# Patient Record
Sex: Male | Born: 1955 | Race: White | Hispanic: No | Marital: Married | Smoking: Current every day smoker
Health system: Southern US, Community
[De-identification: ages and names within clinical notes are randomized; demographics above are authoritative.]

## PROBLEM LIST (undated history)

## (undated) DIAGNOSIS — M1990 Unspecified osteoarthritis, unspecified site: Secondary | ICD-10-CM

## (undated) DIAGNOSIS — Z951 Presence of aortocoronary bypass graft: Secondary | ICD-10-CM

## (undated) DIAGNOSIS — E785 Hyperlipidemia, unspecified: Secondary | ICD-10-CM

## (undated) DIAGNOSIS — F419 Anxiety disorder, unspecified: Secondary | ICD-10-CM

## (undated) DIAGNOSIS — I2510 Atherosclerotic heart disease of native coronary artery without angina pectoris: Secondary | ICD-10-CM

## (undated) DIAGNOSIS — C801 Malignant (primary) neoplasm, unspecified: Secondary | ICD-10-CM

## (undated) DIAGNOSIS — J90 Pleural effusion, not elsewhere classified: Secondary | ICD-10-CM

## (undated) DIAGNOSIS — I509 Heart failure, unspecified: Secondary | ICD-10-CM

## (undated) DIAGNOSIS — I10 Essential (primary) hypertension: Secondary | ICD-10-CM

## (undated) HISTORY — DX: Heart failure, unspecified: I50.9

## (undated) HISTORY — PX: CORONARY ARTERY BYPASS GRAFT: SHX141

## (undated) HISTORY — DX: Pleural effusion, not elsewhere classified: J90

## (undated) HISTORY — DX: Hyperlipidemia, unspecified: E78.5

## (undated) HISTORY — DX: Unspecified osteoarthritis, unspecified site: M19.90

## (undated) HISTORY — DX: Presence of aortocoronary bypass graft: Z95.1

## (undated) HISTORY — DX: Anxiety disorder, unspecified: F41.9

## (undated) HISTORY — DX: Atherosclerotic heart disease of native coronary artery without angina pectoris: I25.10

## (undated) HISTORY — DX: Essential (primary) hypertension: I10

## (undated) HISTORY — PX: OTHER SURGICAL HISTORY: SHX169

---
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
## 2002-01-03 HISTORY — PX: CORONARY ARTERY BYPASS GRAFT: SHX141

## 2002-03-12 ENCOUNTER — Encounter: Payer: Self-pay | Admitting: Thoracic Surgery (Cardiothoracic Vascular Surgery)

## 2002-03-13 ENCOUNTER — Encounter: Payer: Self-pay | Admitting: Thoracic Surgery (Cardiothoracic Vascular Surgery)

## 2002-03-13 ENCOUNTER — Inpatient Hospital Stay (HOSPITAL_COMMUNITY)
Admission: RE | Admit: 2002-03-13 | Discharge: 2002-03-17 | Payer: Self-pay | Admitting: Thoracic Surgery (Cardiothoracic Vascular Surgery)

## 2002-03-14 ENCOUNTER — Encounter: Payer: Self-pay | Admitting: Thoracic Surgery (Cardiothoracic Vascular Surgery)

## 2002-03-15 ENCOUNTER — Encounter: Payer: Self-pay | Admitting: Thoracic Surgery (Cardiothoracic Vascular Surgery)

## 2004-09-27 ENCOUNTER — Ambulatory Visit (HOSPITAL_COMMUNITY): Admission: RE | Admit: 2004-09-27 | Discharge: 2004-09-27 | Payer: Self-pay | Admitting: Orthopedic Surgery

## 2004-09-30 ENCOUNTER — Ambulatory Visit: Payer: Self-pay | Admitting: Cardiology

## 2006-02-24 ENCOUNTER — Ambulatory Visit: Payer: Self-pay | Admitting: Cardiology

## 2006-06-28 ENCOUNTER — Emergency Department (HOSPITAL_COMMUNITY): Admission: EM | Admit: 2006-06-28 | Discharge: 2006-06-28 | Payer: Self-pay | Admitting: Emergency Medicine

## 2007-02-22 ENCOUNTER — Ambulatory Visit: Payer: Self-pay | Admitting: Cardiology

## 2008-02-27 ENCOUNTER — Ambulatory Visit: Payer: Self-pay | Admitting: Cardiology

## 2008-03-07 ENCOUNTER — Encounter (HOSPITAL_COMMUNITY): Admission: RE | Admit: 2008-03-07 | Discharge: 2008-06-05 | Payer: Self-pay | Admitting: Cardiology

## 2008-03-07 ENCOUNTER — Ambulatory Visit: Payer: Self-pay | Admitting: Cardiology

## 2008-04-16 ENCOUNTER — Encounter: Payer: Self-pay | Admitting: Cardiology

## 2008-05-01 ENCOUNTER — Telehealth (INDEPENDENT_AMBULATORY_CARE_PROVIDER_SITE_OTHER): Payer: Self-pay | Admitting: *Deleted

## 2008-05-22 ENCOUNTER — Encounter: Payer: Self-pay | Admitting: Cardiology

## 2008-11-19 ENCOUNTER — Encounter (INDEPENDENT_AMBULATORY_CARE_PROVIDER_SITE_OTHER): Payer: Self-pay | Admitting: *Deleted

## 2010-02-24 ENCOUNTER — Ambulatory Visit: Payer: Self-pay | Admitting: Cardiology

## 2010-03-05 DIAGNOSIS — E785 Hyperlipidemia, unspecified: Secondary | ICD-10-CM | POA: Insufficient documentation

## 2010-03-05 DIAGNOSIS — I2510 Atherosclerotic heart disease of native coronary artery without angina pectoris: Secondary | ICD-10-CM | POA: Insufficient documentation

## 2010-03-05 DIAGNOSIS — I10 Essential (primary) hypertension: Secondary | ICD-10-CM | POA: Insufficient documentation

## 2010-03-05 DIAGNOSIS — E118 Type 2 diabetes mellitus with unspecified complications: Secondary | ICD-10-CM | POA: Insufficient documentation

## 2010-03-09 ENCOUNTER — Encounter: Payer: Self-pay | Admitting: Cardiology

## 2010-03-09 ENCOUNTER — Ambulatory Visit (INDEPENDENT_AMBULATORY_CARE_PROVIDER_SITE_OTHER): Payer: Medicare Other | Admitting: Cardiology

## 2010-03-09 DIAGNOSIS — I2510 Atherosclerotic heart disease of native coronary artery without angina pectoris: Secondary | ICD-10-CM

## 2010-03-16 NOTE — Assessment & Plan Note (Signed)
Summary: F1Y/per pt call=mj rs pt's appt due to dr Loris Winrow in meeting/mt   Visit Type:  Follow-up Primary Provider:  Dr.  Laurian Brim  CC:  CAD.  History of Present Illness: The patient presents for followup of his known coronary disease. It has been about 2 years since the last evaluation. He had a stress perfusion study at that time demonstrating ischemia to be about 45% with no new ischemia. Since then he has had occasional chest discomfort. This happened he walks unremarkable. We will get dyspneic with some activity PND or orthopnea. No new palpitations, presyncope or syncope. He has had no new weeping edema. He has been started on insulin since I last saw him. He thinks his lipids have been well controlled. He has had some form of tingling and numbness felt possibly to be cervical disc disease.  Current Medications (verified): 1)  Aspirin Ec 325 Mg Tbec (Aspirin) .... Take One Tablet By Mouth Daily 2)  Metformin Hcl 500 Mg Tabs (Metformin Hcl) .... Take 2 Tablets Bid 3)  Metoprolol Tartrate 50 Mg Tabs (Metoprolol Tartrate) .... Take One Tablet By Mouth Twice A Day 4)  Crestor 5 Mg Tabs (Rosuvastatin Calcium) .... Take One Tablet By Mouth Daily. 5)  Byetta 10 Mcg Pen 10 Mcg/0.35ml Soln (Exenatide) .... 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      Past Surgical History: CABG Right lower leg surgery Left ankle surgery  Review of Systems       As stated in the HPI and negative for all other systems.   Vital Signs:  Patient profile:   55 year old male Height:      72 inches Weight:      227.75 pounds BMI:     31.00 Pulse rate:   73 / minute Pulse rhythm:    regular Resp:     18 per minute BP sitting:   134 / 77  (left arm) Cuff size:   large  Vitals Entered By: Sidney Ace (March 09, 2010 2:19 PM)  Physical Exam  General:  Well developed, well nourished, in no acute distress. Head:  normocephalic and atraumatic Eyes:  PERRLA/EOM intact; conjunctiva and lids normal. Mouth:  Teeth, gums and palate normal. Oral mucosa normal. Neck:  Neck supple, no JVD. No masses, thyromegaly or abnormal cervical nodes. Chest Wall:  well healed sternotomy scar Lungs:  Clear bilaterally to auscultation and percussion. Abdomen:  Bowel sounds positive; abdomen soft and non-tender without masses, organomegaly, or hernias noted. No hepatosplenomegaly. Msk:  Back normal, normal gait. Muscle strength and tone normal. Extremities:  No clubbing or cyanosis. Neurologic:  Alert and oriented x 3. Skin:  Intact without lesions or rashes. Cervical Nodes:  no significant adenopathy Inguinal Nodes:  no significant adenopathy Psych:  Normal affect.   Detailed Cardiovascular Exam  Neck    Carotids: Carotids full and equal bilaterally without bruits.      Neck Veins: Normal, no JVD.    Heart    Inspection: no deformities or lifts noted.      Palpation: normal PMI with no thrills palpable.      Auscultation: regular rate and rhythm, S1, S2 without murmurs, rubs, gallops, or clicks.  Vascular    Abdominal Aorta: no palpable masses, pulsations, or audible bruits.      Femoral Pulses: normal femoral pulses bilaterally.      Pedal Pulses: normal pedal pulses bilaterally.      Radial Pulses: normal radial pulses bilaterally.      Peripheral Circulation: no clubbing, cyanosis, or edema noted with normal capillary refill.     EKG  Procedure date:  03/09/2010  Findings:      Sinus rhythm, rate 72, early repolarization pattern  Impression & Recommendations:  Problem # 1:  CAD (ICD-414.00) Given his symptoms and ongoing risk factors, with a new bypass grafts  stress perfusion testing is indicated. Because of some joint pains he could walk a treadmill. He pharmacologic perfusion study. Orders: Nuclear Stress Test (Nuc Stress Test) EKG w/ Interpretation (93000)  Problem # 2:  HYPERTENSION (ICD-401.9) his blood pressure is controlled and will continue meds as listed.  Problem # 3:  DYSLIPIDEMIA (ICD-272.4) The goal for his LDL is less than 70 with an HDL of 40. I would be happy to review upcoming lab  Patient Instructions: 1)  Your physician recommends that you schedule a follow-up appointment in: 1 year with Dr. Percival Spanish 2)  Your physician recommends that you continue on your current medications as directed. Please refer to the Current Medication list given to you today. 3)  Your physician has requested that you have an [employer].  For further information please visit [email or web address].  Please follow instruction sheet, as given.

## 2010-03-24 ENCOUNTER — Telehealth: Payer: Self-pay | Admitting: Cardiology

## 2010-03-24 ENCOUNTER — Ambulatory Visit (HOSPITAL_COMMUNITY): Payer: Medicare Other | Attending: Cardiology | Admitting: Radiology

## 2010-03-24 DIAGNOSIS — I25810 Atherosclerosis of coronary artery bypass graft(s) without angina pectoris: Secondary | ICD-10-CM

## 2010-03-24 DIAGNOSIS — R0989 Other specified symptoms and signs involving the circulatory and respiratory systems: Secondary | ICD-10-CM

## 2010-03-24 DIAGNOSIS — R079 Chest pain, unspecified: Secondary | ICD-10-CM

## 2010-03-24 DIAGNOSIS — I4949 Other premature depolarization: Secondary | ICD-10-CM

## 2010-03-24 DIAGNOSIS — I2510 Atherosclerotic heart disease of native coronary artery without angina pectoris: Secondary | ICD-10-CM | POA: Insufficient documentation

## 2010-03-24 DIAGNOSIS — R0609 Other forms of dyspnea: Secondary | ICD-10-CM

## 2010-03-24 MED ORDER — TECHNETIUM TC 99M TETROFOSMIN IV KIT
10.0000 | PACK | Freq: Once | INTRAVENOUS | Status: AC | PRN
  Administered 2010-03-24: 10 via INTRAVENOUS

## 2010-03-24 MED ORDER — TECHNETIUM TC 99M TETROFOSMIN IV KIT
30.0000 | PACK | Freq: Once | INTRAVENOUS | Status: AC | PRN
  Administered 2010-03-24: 30 via INTRAVENOUS

## 2010-03-24 NOTE — Telephone Encounter (Signed)
Walk in pt form received" pt would like correction on Med List" sent to Message Nurse/KM

## 2010-03-24 NOTE — Progress Notes (Addendum)
[place] [address] [phone]  Cardiology Nuclear Med Study Craig Koch male Apr 27, 1955   Nuclear Med Background Indication for Stress Test:  Evaluation for Ischemia and Graft Patency History:  CABG Cardiac Risk Factors: Hypertension, IDDM Type 2, Lipids and Smoker  Symptoms:  Chest Pain (last date of chest discomfort within the last 2 weeks), DOE, Fatigue, Palpitations and SOB   Nuclear Pre-Procedure Caffeine/Decaff Intake:  None NPO After: 8:30pm   Lungs:  clear IV 0.9% NS with Angio Cath:  20g  IV Site: R Antecubital  IV Started by:  Crissie Figures, RN  Chest Size (in):  46 Cup Size:     Height: 6' (1.829 m)  Weight:  228 lb (103.42 kg)  BMI:  Body mass index is 30.92 kg/(m^2). Tech Comments:  Held Metoprolol this AM    Nuclear Med Study 1 or 2 day study: 1 day  Stress Test Type:  Treadmill/Lexiscan  Reading MD: Dola Argyle, MD  Order Authorizing Provider:  J.Hochrein  Resting Radionuclide: Technetium 25m Tetrofosmin  Resting Radionuclide Dose: 11 mCi   Stress Radionuclide:  Technetium 17m Tetrofosmin  Stress Radionuclide Dose: 33 mCi           Stress Protocol Rest HR:63 Stress HR:100  Rest BP: 97/61 Stress BP: 137/61  Exercise Time:  N/A METS: N/A  Predicted HR:N/A % of Maximum:         Dose of Adenosine:N/A   Dose of Lexiscan:  0.4 mg  Dose of Atropine:  N/A Dose of Dobutamine: N/A  mcg/kg/min (at max HR)  Stress Test Technologist: Perrin Maltese, EMT-P  Nuclear Technologist:  Charlton Amor, CNMT     Rest Procedure:  Myocardial perfusion imaging was performed at rest 45 minutes following the intravenous administration of Technetium 91m Tetrofosmin. Rest ECG: NSR. Early Repolarization, and rare PVCS  Stress Procedure:  The patient received IV Lexiscan 0.4 mg over 15-seconds with concurrent low level exercise and then Technetium 48m Tetrofosmin was injected at 30-seconds while the  patient continued walking one more minute.  There were no significant changes with Lexiscan.  Quantitative spect images were obtained after a 45-minute delay. Stress ECG: No significant changes and rare pvcs  QPS Raw Data Images:  Normal; no motion artifact; normal heart/lung ratio. Stress Images:  Moderate decreased activity in the inferior wall and the apex Rest Images:  Moderate decreased activity in the inferior wall. Mild decrease in the apex Subtraction (SDS):  Mild reversibility in the apex  Findings Risk Category:  Abnormal Clinically Abnormal:  No Ischemia:  Yes Fixed Defect:  Yes LV Dysfunction:  Yes Transient Ischemic Dilatation (Normal <1.22): 1.02 Lung/Heart Ratio (Normal <0.45):  .34  Quantitative Gated Spect Images QGS EDV:  135 ml QGS ESV:  65 ml QGS cine images:  Dyssynergy of the septum c/w CABG.  QGS EF:  52 %  Impression Exercise Capacity:  Lexiscan with no exercise. BP Response:  Normal blood pressure response. Clinical Symptoms:  SOB ECG Impression:  No significant ST segment change suggestive of ischemia. Comparison with Prior Nuclear Study: No images to compare  Overall Impression:  There is old mild scar in the inferior wall. There is suggestion of some ischemia at the apex.  The EF is the same or slightly better than previous.  This is a low risk study.  Continue with current therapy.

## 2010-04-02 ENCOUNTER — Telehealth: Payer: Self-pay | Admitting: Cardiology

## 2010-04-02 NOTE — Telephone Encounter (Signed)
Pt wants results of stress test

## 2010-04-05 NOTE — Progress Notes (Signed)
Pt aware of results and to continue current therapy

## 2010-04-05 NOTE — Telephone Encounter (Signed)
Pt aware of results and to continue current therapy

## 2010-05-18 NOTE — Assessment & Plan Note (Signed)
[facility] OFFICE NOTE   NAME:Cantrall, PACO FALKOWITZ                      MRN:          [id number]  DATE:02/22/2007                            DOB:          06-03-1955    PRIMARY:  Dr. Laurian Brim.   REASON PRESENTATION:  Evaluate the patient with coronary disease and  recent syncope.   HISTORY OF PRESENT ILLNESS:  The patient is a 55 year old white  gentleman who presents for yearly follow-up.  He had been doing well  over the last year until about week ago.  He said he stood up from the  couch and had a syncopal episode.  He had no prodrome.  He woke up on  the floor.  There was no one there to see it.  He did not have any loss  of bowel or bladder.  Said he did he apparently hit his left arm as his  forearm and elbow have been sore.  He had been having some discomfort  and weakness in the muscle of his forearm on the ulnar side before this.  He notices the muscle is less prominent than on the right.  He has some  weakness in his ring and pinky fingers on the left-hand.  Had some sharp  pain up to the shoulder.  Has not had any anginal pain.  He denies any  chest pressure, neck or arm discomfort.  He has not had palpitations.  He will feel occasional symptoms of dizziness when he stands up.  Of  note, he stopped his lisinopril on his own after this episode.   PAST MEDICAL HISTORY:  Coronary artery disease (CABG with a LIMA to the  LAD, sequential radial artery to obtuse marginal 2 and obtuse marginal  3, sequential vein graft to diagonal), mildly reduced ejection fraction  (40%), hypertension, diabetes mellitus times 6 to 7 years.   ALLERGIES:  None.   MEDICATIONS:  Aspirin 325 mg daily, glyburide metformin 5/500 b.i.d.,  metoprolol 50 mg b.i.d., Crestor 5 mg daily.   REVIEW OF SYSTEMS:  As stated in the HPI, otherwise in for other  systems.   PHYSICAL EXAMINATION:  The patient is in no distress.  Blood  pressure 112/75, every 84 and regular, weight 237 pounds, body  mass index 32.  HEENT:  Eyelids unremarkable.  Pupils are equal, round, and reactive to  light and accommodation.  Fundi are not visualized.  Oral mucosa  unremarkable.  NECK:  No jugular venous distension 45 degrees, carotid upstroke brisk  and symmetric, no bruits, thyromegaly.  LYMPHATICS:  No adenopathy.  LUNGS:  Clear to auscultation bilaterally.  BACK:  No costovertebral angle tenderness.  CHEST:  Well-healed sternotomy scar.  HEART:  PMI not displaced or sustained, S1 and S2 within normal limits,  no S3, no murmurs.  ABDOMEN:  Obese, positive bowel sounds, normal in frequency and pitch,  no bruits, rebound, guarding.  No hepatomegaly, splenomegaly.  SKIN:  No rashes, no nodules.  EXTREMITIES:  With 2+ pulses throughout, no edema.    EKG sinus rhythm, rate 81, axis within normal  limits, intervals within  normal limits, poor anterior R wave progression.   ASSESSMENT/PLAN:  1. Syncope.  The patient had a syncopal episode.  I did orthostatics      today in the office and he did not drop his blood pressure.      However, this episode was very consistent with orthostatic episode.      I would not suggest arrhythmia.  Has had none since or before that.      He did stop the lisinopril.  I think this is reasonable given this      event.  He will continue on the beta blocker, however.  He will      come back to see me if he has any recurrent presyncope or syncope.  2. Coronary artery disease not any symptoms consistent with angina.      No further cardiovascular testing is suggested.  3. Smoking.  We discussed the need to stop smoking.  Last visit I had      given him Chantix, but he did want to take this.  We discussed this      again (greater than 3 minutes).  Hopefully he can comply with this.  4. Dyslipidemia per Dr. Hardin Negus.  Goals LDL less than 70 and HDL      greater than 40.  5. Diabetes per Dr. Hardin Negus.  6.  Left arm weakness.  The patient as well as weakness in an ulnar      distribution.  At this point I would suggest referral to an      orthopedist.  I will defer to Dr. Hardin Negus.  7. Follow-up will see the patient again in 1 year or sooner if needed.     Minus Breeding, MD, [facility]  Electronically Signed    JH/MedQ  DD: 02/22/2007  DT: 02/23/2007  Job #: [id number]   cc:   Laurian Brim

## 2010-05-18 NOTE — Assessment & Plan Note (Signed)
[facility] OFFICE NOTE   NAME:Craig Koch, Craig Koch                      MRN:          [id number]  DATE:02/27/2008                            DOB:          03-18-1955    PRIMARY CARE PHYSICIAN:  Dr. Laurian Brim.   REASON FOR PRESENTATION:  Evaluate the patient with chest pain and  coronary artery disease.   HISTORY OF PRESENT ILLNESS:  The patient returns for yearly followup.  When I last saw him, he had had syncopal episodes and had his ACE  inhibitors stopped because of this.  He said since then, he has had a  few dizzy spells, but no syncopal episodes.  He has had no presyncope.  He is not really noticing any palpitations.  He probably had orthostasis  at that time and does not seem to be bothered by this.  He does get some  chest discomfort.  This occurs occasionally and sporadically.  It is  midsternal.  It radiates under his left breast.  It can come on with  activity.  It seems to be somewhat similar to his last chest pain and to  his coronary artery disease pain, but he does not really recall the  details of this.  He has taken about a dozen sublingual nitroglycerin in  6 months.  This seems to help discomfort when it comes on.  He is not  describing any associated nausea, vomiting, or diaphoresis.  He does get  short of breath with mild activities.  He says he is limited by ankle  pain.  He has also had some weakness in his hands.  He is apparently  going to see a neurologist for muscle wasting and weakness.  Unfortunately, he still dips some snuff and to try to avoid this, he  chews on a cigar.  He says he is not smoking cigarettes.  He has also  had some bilateral shoulder pain with movement, but this seems to be  related to positioning.   PAST MEDICAL HISTORY:  Coronary artery disease (CABG with LIMA to the  LAD, sequential radial artery to obtuse marginal 2 and obtuse marginal  3, sequential vein  graft to the diagonal), mildly reduced ejection  fraction (40%), hypertension, diabetes mellitus, dyslipidemia.   ALLERGIES:  None.   MEDICATIONS:  1. Aspirin 325 mg daily.  2. Glyburide/metformin 5/500 b.i.d.  3. Metoprolol 50 mg b.i.d.  4. Crestor 5 mg daily.   REVIEW OF SYSTEMS:  As stated in the HPI, otherwise negative for all  other systems.   PHYSICAL EXAMINATION:  GENERAL:  The patient is in no distress.  VITAL SIGNS:  Blood pressure 124/76, heart rate 81 and regular, weight  228 pounds, body mass index 31.  HEENT:  Eyelids unremarkable.  Pupils equal, round, and reactive to  light.  Fundi not visualized.  Oral mucosa unremarkable.  NECK:  No jugular venous distention at 45 degrees.  Carotid upstroke  brisk and symmetric.  No bruits.  No thyromegaly.  LYMPHATICS:  No adenopathy.  LUNGS:  Decreased breath sounds, but no wheezing or  crackles.  BACK:  No costovertebral angle tenderness.  CHEST:  Unremarkable.  HEART:  PMI not displaced or sustained.  S1 and S2 within normal limits.  No S3.  No S4.  No clicks, rubs, or murmurs.  ABDOMEN:  Flat.  Positive bowel sounds, normal in frequency and pitch.  No bruits, no rebound, no guarding.  No midline pulsatile mass.  No  hepatomegaly.  No splenomegaly.  SKIN:  No rashes, no nodules.  EXTREMITIES:  Absent radials bilaterally, but palpable ulnar pulses, 2+  femorals, 2+ popliteals, 1+ dorsalis pedis and posterior tibialis  bilaterally.  NEURO:  Oriented to person, place, and time.  Cranial nerves II-XII  grossly intact.  Motor grossly intact.   EKG, old inferior infarct, poor anterior R-wave progression with  possible old anteroseptal infarct, nonspecific ST-T wave changes, and  all unchanged from previous EKGs.   ASSESSMENT AND PLAN:  1. Chest discomfort.  The patient is describing chest discomfort and      it has been 5 years since his last stress perfusion study.      Interestingly, he had one, 4 weeks after his bypass  when he was      followed by another Cardiology Service.  At this point, given the      possibility that this is recurrent obstruction, he needs to be      screened with a stress perfusion study.  He would not be able to      walk, so he will have an adenosine Cardiolite.  Further evaluation      based on these results.  2. Tobacco.  He understands need to quit smoking altogether.  He was      encouraged to quit even the snuff and cigars that he chews on.  3. Dyspnea.  We will evaluate his ejection fraction with his stress      perfusion study.  If this is unchanged, then no further therapy is      planned.  He does have a reduced ejection fraction, but he was very      orthostatic on ACE inhibitors and so this is being held.  He will      continue the beta-blocker.  4. Dyslipidemia.  Per Dr. Hardin Negus with a goal LDL less than 70 and      HDL greater than 40.  5. Diabetes.  Per Dr. Hardin Negus.  6. Followup.  I would see him back yearly if he does well or sooner if      needed.     Minus Breeding, MD, [facility]  Electronically Signed    JH/MedQ  DD: 02/27/2008  DT: 02/28/2008  Job #: [id number]   cc:   Laurian Brim

## 2010-05-21 NOTE — Op Note (Signed)
NAME:  Craig Koch, RESENDES                         ACCOUNT NO.:  [id number]   MEDICAL RECORD NO.:  [id number]                   PATIENT TYPE:  INP   LOCATION:  2315                                 FACILITY:  [facility]   PHYSICIAN:  Craig Koch, M.D.         DATE OF BIRTH:  02-Dec-1955   DATE OF PROCEDURE:  03/13/2002  DATE OF DISCHARGE:                                 OPERATIVE REPORT   PREOPERATIVE DIAGNOSIS:  Severe three-vessel coronary disease.   POSTOPERATIVE DIAGNOSIS:  Severe three-vessel coronary disease.   PROCEDURES:  1. Median sternotomy.  2. Extracorporeal circulation.  3. Coronary artery bypass grafting x5 (left internal mammary artery to the     left anterior descending coronary artery, sequential left radial artery     to obtuse marginal 2 and 3, saphenous vein graft to the first diagonal,     saphenous vein graft to acute marginal).   SURGEON:  Craig Koch, M.D.   ASSISTANT:  Mardene Celeste, R.N.   ANESTHESIA:  General.   FINDINGS:  Good-quality targets.  Good-quality conduits.  Left ventricular  hypertrophy.   CLINICAL NOTE:  The patient is a 55 year old gentleman who presented earlier  this year with new-onset angina.  He was initially treated medically and had  symptomatic improvement.  He was advised to undergo cardiac catheterization.  Catheterization showed severe three-vessel coronary disease with total  occlusion of his right coronary, a dominant left coronary with a 50%  stenosis in its midportion and a 95% stenosis in its distal portion.  He  also had a 70% stenosis in the proximal portion of the nondominant right  coronary.  The patient was advised to undergo coronary artery bypass  grafting.  The indications, risks, benefits, and alternatives were discussed  in detail with the patient.  He understood the survival benefit with  coronary artery bypass grafting for three-vessel disease.  The patient  understood and accepted the  risks of surgery and agreed to proceed with the  operation.   DESCRIPTION OF PROCEDURE:  The patient was brought to the preop holding are  on 03/13/02.  Lines were placed to monitor arterial, central venous, and  pulmonary arterial pressure.  EKG leads were placed for continuous  telemetry.  Intravenous antibiotics were administered.  The patient was  taken to the operating room, anesthestized, and intubated, the Foley  catheter was placed.  The chest, abdomen, legs, and left arm were prepped  and draped in the usual fashion.   The left radial artery was then harvested using the standard technique.  Incision was made over the distal aspect of the left wrist on the volar  aspect of the wrist.  The radial artery was identified.  It was a good-sized  vessel.  Initially the distal aspect was dissected out and a soft bulldog  clamp was placed across the vessel while palpating distally.  The pulse  remained distally.  This confirmed the  normal preoperative Allen's test that  was performed on two separate occasions.  The decision was made to harvest  the remainder of the vessel.  The incision then was extended to explore the  elbow crease and the radial artery was dissected using the Harmonic scalpel.  Large branches were doubly clipped and divided.  After dissecting the vessel  along its entire length and checking meticulously for bleeding, the distal  end of the vessel was clamped and divided.  The distal stump was flashed  with good back bleeding.  The distal segment was suture ligated with a 4-0  Prolene.  The proximal end then was divided and again the stump was suture  ligated with a 4-0 Prolene suture.  Meticulous hemostasis was achieved.  The  __________ was placed in a heparinized saline-papaverine solution.  The arm  incision was closed in two layers with a 2-0 Vicryl subcutaneous suture and  a 3-0 Vicryl subcuticular suture.   Simultaneous with this, incision was made in the left leg  at the level of  the knee.  An attempt to isolate the greater saphenous vein at that site for  endoscopic harvesting was unsuccessful; therefore, the vein was harvested  from the lower leg.  The vein was of excellent quality in the lower leg.  This was done through bridged incisions with a standard open technique.   A median sternotomy was performed.  The left internal mammary artery was  harvested using standard technique.  This was a good-quality vessel.  The  patient was fully heparinized prior to dividing the distal end of the  mammary artery.   The pericardium was opened and the ascending aorta was inspected and  palpated, and it was of normal size and caliber.  There was no palpable  atherosclerotic disease.  The aorta was cannulated via concentric 2-0  Ethibond nonpledgeted pursestring sutures.  A dual-stage venous cannula was  placed via pursestring suture in the right atrial appendage.  Cardiopulmonary bypass was instituted and the patient was cooled to 32  degrees Celsius.  The coronary arteries were inspected and anastomotic sites  were chosen.  The conduits were inspected and cut to length.  A foam pad was  placed in the pericardium to protect the left phrenic nerve, a temperature  probe was placed in the myocardial septum, and a cardioplegia cannula was  placed in the ascending aorta.   The aorta was crossclamped, the left ventricle was emptied via the aortic  root vent.  Cardiac arrest then was achieved with a combination of cold  antegrade blood cardioplegia and topical iced saline.  Initial there was  slow cooling, but 1 L of cardioplegia was administered and myocardial septal  temperature dropped to 12 degrees Celsius.  The following distal anastomoses  were performed:  First a reversed saphenous vein graft was placed end-to-  side to the acute marginal branch of the right coronary.  This was the largest of the acute marginals.  There were two vessels identified.  The   acute marginal was a 1.5 mm good-quality target.  The vein graft was of good  quality.  The anastomosis was performed with a running 7-0 Prolene suture.  There was excellent flow through the graft.  Cardioplegia was administered.  There was good hemostasis.   Next a reversed saphenous vein graft was placed end-to-side to the first  diagonal branch of the LAD.  This was also a 1.5 mm good-quality target and  again the vein was of good  quality.  The anastomosis again was performed  with a running 7-0 Prolene suture.  There was excellent flow through this  graft as well.  With cardioplegia administration there was good hemostasis.   Next the left radial artery was placed sequentially to obtuse marginals 2  and 3.  The left circumflex was a large dominant vessel.  Obtuse marginal 2  was the largest lateral branch.  There was a 50% stenosis in the left  circumflex just after the takeoff of OM-1.  OM-2 itself was a 2 mm good-  quality target.  A side-to-side anastomosis was performed of the radial  artery to the OM-2 with a running 8-0 Prolene suture.  The anastomosis was  probed proximally and distally and the probe was also passed retrograde  through the radial artery across the anastomosis to ensure patency in all  directions.  Next an end-to-side anastomosis was performed to OM-3, which  was compromised by a 95% stenosis.  This was a 1.5 mm good-quality target.  Both of these anastomoses were performed with running 8-0 Prolene sutures.  There was good flow through this graft.  Cardioplegia was administered via  the aortic root and the vein grafts.  There was good back bleeding from the  radial graft.  It was evident at this point that the radial graft was not of  adequate length to each the aorta for a proximal anastomosis.   Next the left internal mammary artery was brought through a window in the  pericardium.  The distal end was spatulated.  It was a 2.5 mm good-quality  vessel.  It was  anastomosed end-to-side to the LAD, which was a 1.5 mm fair-  quality target.  The LAD was totally occluded proximally.  The 1.5 mm did  pass both retrograde to the total occlusion as well as antegrade to the  apex.  The anastomosis was performed with a running 8-0 Prolene suture in an  end-to-side fashion.  At completion of the mammary to LAD anastomosis, the  bulldog clamp was briefly removed from the mammary artery to inspect for  hemostasis and then was replaced.  There was immediate and rapid septal  rewarming.  The mammary pedicle was tacked to the epicardial surface of the  heart with 6-0 Prolene sutures.  Additional cardioplegia was administered.  The vein grafts were cut to length.   The proximal vein graft anastomoses were then performed to 4.0 mm punch  aortotomies with running 6-0 Prolene sutures.  At the completion of the  final proximal anastomosis the patient was placed in Trendelenburg position. The aortic root was de-aired, the bulldog clamp was removed from the mammary  artery, and immediate and rapid septal rewarming was once again noted.  Lidocaine was administered.  The aortic crossclamp was then removed with a  total crossclamp time of 81 minutes.  The veins were aspirated to remove any  residual trapped air.  The left radial artery was cut to length proximally.  Bulldog clamps were placed on the diagonal vein graft proximally and  distally.  A longitudinal venotomy was made in the diagonal vein graft and  the proximal anastomosis of the radial artery was placed to the diagonal  vein with a running 7-0 Prolene suture.  The anastomosis was de-aired.  The  bulldog clamps were removed.  All proximal and distal anastomoses then were  inspected for hemostasis.  This patient was then rewarmed.  Epicardial  pacing wires were placed on the right ventricle and right  atrium.  When the  patient reached a core temperature of 37 degrees Celsius, he was weaned from  cardiopulmonary  bypass.  He was on a low-dose dopamine drip and in sinus  rhythm at the time of separation from bypass.  Total bypass time was 147  minutes.  Initial cardiac index was greater than 2 L/min. per sq. m, and the  patient remained hemodynamically stable throughout the post bypass period.   A test dose of protamine was administered and was well-tolerated.  The  atrial and aortic cannulae were removed and the remainder of the protamine  was administered without incident.  The chest was irrigated with 1 L of warm  normal saline containing 1 g of vancomycin.  Hemostasis was achieved.  A  left pleural and two mediastinal chest tubes were placed through separate  subcostal incisions.  The pericardium was reapproximated with interrupted 3-  0 silk sutures.  It came together easily without tension.  There was no  hemodynamic change.  The sternum was closed with heavy-gauge stainless steel  wires.  It came together easily, and there again was no hemodynamic change  with closure of the  chest.  The remainder of the incisions were closed in standard fashion with  subcuticular skin closures.  All sponge, needle, and instrument counts were  correct at the end of the procedure.  The patient remained hemodynamically  stable and was taken from the operating room to the surgical intensive care  unit in critical but stable condition.                                                Craig Standard Roxan Koch, M.D.    SCH/MEDQ  D:  03/13/2002  T:  03/14/2002  Job:  [id number]   cc:   Eden Lathe. Einar Gip, M.D.  [address], [address] [place] [address]  Fax: [place]  [address]  [place]  [place] [address]  Fax: [phone]

## 2010-05-21 NOTE — Discharge Summary (Signed)
NAME:  Craig Koch, Craig Koch                         ACCOUNT NO.:  [id number]   MEDICAL RECORD NO.:  [id number]                   PATIENT TYPE:  INP   LOCATION:  2013                                 FACILITY:  [facility]   PHYSICIAN:  Revonda Standard. Roxan Hockey, M.D.         DATE OF BIRTH:  09-08-1955   DATE OF ADMISSION:  03/13/2002  DATE OF DISCHARGE:  03/17/2002                                 DISCHARGE SUMMARY   DISCHARGE DIAGNOSES:  1. Severe three-vessel atherosclerotic coronary artery disease.  2. Type 2 diabetes mellitus.  3. Hypercholesterolemia.  4. History of recent myocardial infarction.  5. History of current long-term tobacco habituation.   PROCEDURE:  On March 13, 2002, coronary artery bypass graft surgery x5.  In  this procedure, the left internal mammary artery was connected in an end-to-  side fashion to the left anterior descending coronary artery.  A reverse  saphenous vein graft was fashioned from the aorta to the first diagonal.  A  reverse saphenous vein graft was fashioned from the aorta to the acute  marginal.  A sequential left radial artery was fashioned from the aorta to  the second obtuse marginal and then to the third obtuse marginal.  Dr.  Modesto Charon was the surgeon.   DISPOSITION:  The patient was ready for discharge on postop day #4 after  undergoing coronary artery bypass graft surgery.  His postoperative  convalescence has been unremarkable.  He has maintained sinus rhythm  throughout the postoperative period.  He has not required any oxygen  supplementation.  He was extubated on the day of surgery to 2 L of nasal  cannula at 98%.  He was on room air by postop day #2.  He did have volume  overload.  He was nine pounds of fluid over his preoperative weight on  postop day #3, and will go home with gentle diuresis with Lasix.  He has  been afebrile and his mental status is clear.  His incisions are all healing  nicely.  There is no evidence of erythema,  swelling or drainage.  He does  have some peripheral edema from his volume overload.  While in the hospital,  he has been counseled on smoking cessation and started on Zyban.  He has  also had excellent serum blood glucose control and started on Avandia as  well as his home medication which is Glucovance.   DISCHARGE MEDICATIONS:  1. Oxycodone 5 mg one to two tablets p.o. q.4-6h. p.r.n. pain, although he     is not taking much pain medicine at the present.  2. Enteric coated aspirin 325 mg daily.  3. Coreg 12.5 mg tablet in the morning and 1/2 of a tablet 6.25 mg in the     evening.  4. Lipitor 10 mg daily, preferably at bedtime.  5. Isosorbide mononitrate 30 mg daily.  6. Zyban 150 mg daily.  7. Avandia 4 mg daily.  8. Glucovance  5/500 mg twice daily.  9. Lasix 40 mg daily x5 days.  10.      Potassium chloride 20 mEq daily x5 days.   ACTIVITY:  Walk daily to keep up his strength.  He is not to drive until he  sees Dr. Roxan Hockey.   DIET:  Low sodium, low cholesterol, ADA diet.   SPECIAL INSTRUCTIONS:  He may shower.  He is to call Dr. Leonarda Salon  office if his incisions become inflamed and start to drain.   FOLLOW UP:  He has an office visit with Dr. Einar Gip two weeks after discharge.  He is to call [phone], to arrange the appointment.  Chest x-ray will be  taken.  He will see Dr. Roxan Hockey three weeks after discharge.  Dr.  Leonarda Salon office will call.   HISTORY OF PRESENT ILLNESS:  The patient is a 55 year old male who has a  four-year history of type 2 diabetes mellitus.  He had no known cardiac  disease until recently.  Several months ago, he began having chest pain.  He  describes the pain as a tightness in the chest, primarily related to  exertion, although, he has had episodes with minimal activity or at rest.  He was seen by Dr. Laurian Brim and Dr. Hardin Negus was concerned that with  risk factors present, this could be angina.  He was started on  medications.  His electrocardiogram showed changes and he was referred to Dr. Adrian Prows.  Dr. Einar Gip did a stress test which showed marked abnormality and the patient  was convinced to undergo left heart catheterization.  Catheterization showed  severe three-vessel atherosclerotic coronary artery disease with an ejection  fraction of approximately 45%.  The right coronary artery had a 70-80%  stenosis.  There was a 95% stenosis in the circumflex prior to the left  posterior descending.  He had total occlusion of his left anterior  descending coronary artery after the third diagonal.  He has global  hypokinesis, particularly anterior and anterolateral hypokinesis.  The  patient is sent to Dr. Roxan Hockey for consideration of coronary artery  bypass graft surgery.   HOSPITAL COURSE:  Hospital course is relatively unremarkable.  The patient  goes home on postop day #4, as described in discharge disposition.       Sueanne Margarita, P.A.                    Revonda Standard Roxan Hockey, M.D.    GM/MEDQ  D:  03/16/2002  T:  03/18/2002  Job:  [id number]   cc:   Eden Lathe. Einar Gip, M.D.  [address], [address] [place] [address]  Fax: [place]  [address]  [place]  [place] [address]  Fax: [phone]

## 2010-05-21 NOTE — Assessment & Plan Note (Signed)
[facility] OFFICE NOTE   NAME:Craig Koch, DEKENDRICK SLIGH                      MRN:          [id number]  DATE:02/24/2006                            DOB:          04-May-1955    PRIMARY CARE PHYSICIAN:  Dr. Laurian Brim   REASON FOR PRESENTATION:  Evaluate patient with coronary artery disease.   HISTORY OF PRESENT ILLNESS:  The patient presents for followup of his  known coronary disease.  Since I last saw him, he has done well from a  cardiovascular standpoint.  He has, for a couple of years, been getting  some atypical chest discomfort.  This is rare and fleeting.  It seems to  go away with burping.  If it does not go away, he has taken a  nitroglycerin on rare occasions.  He thinks he has probably had ten in  the last year and a half.  He has not had any in the last couple of  months.  He is able to do vigorous activity, such as climb many rungs up  a couple of floors on a wall ladder.  This is quite strenuous, per his  report.  He does not get any chest discomfort with this.  He denies any  neck discomfort, arm discomfort, activity induced nausea or vomiting, or  excessive diaphoresis.  He has had no palpitations, presyncope or  syncope.  He denies any PND or orthopnea.   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   ALLERGIES:  None.   MEDICATIONS:  1. Aspirin 325 mg daily (has not been taking this because of stomach      upset).  2. Glyburide/metformin 5/500 b.i.d.  3. Metoprolol 50 mg b.i.d.  4. Lisinopril 10 mg daily.  5. Crestor 5 mg daily.  6. Triglide 160 mg daily.   REVIEW OF SYSTEMS:  As stated in the HPI, and otherwise negative for  other systems.   PHYSICAL EXAMINATION:   The patient is in no distress.  Blood pressure  145/85, heart rate 82 and regular.  HEENT:  Eyelids unremarkable.  Pupils equal, round and reactive to  light.  Fundi not visualized.  Oral mucosa unremarkable.  NECK:  No jugular venous distention, wave form within normal limits.  Carotid upstroke brisk and symmetric.  No bruits, no thyromegaly.  LYMPHATICS:  No cervical, axillary or inguinal adenopathy.  LUNGS:  Clear to auscultation bilaterally.  BACK:  No costovertebral angle tenderness.  CHEST:  Well-healed sternotomy scar.  HEART:  PMI not displaced or sustained.  S1 and S2 within normal limits.  No S3, no S4, no clicks, no rubs, no murmurs.  ABDOMEN:  Obese, positive bowel sounds, normal in frequency and pitch.  No bruits, rebound, guarding, no midline pulsatile mass, hepatomegaly,  splenomegaly.  SKIN:  No rashes, no nodules.  EXTREMITIES:  2+ pulses, no edema, no cyanosis or clubbing.  NEUROLOGIC:  Oriented to person, place and time.  Cranial nerves II  through XII grossly intact.  Motor grossly intact.   EKG:  Sinus rhythm, rate 74, axis within normal limits, intervals within  normal limits, poor anterior R-wave progression, no acute STT-wave  changes.   ASSESSMENT AND PLAN:  1. Coronary artery disease:  The patient is having no symptoms      consistent with his previous angina.  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  He      says he may have to go up and down eight times in a two-hour      period.  This gets his heart rate up and gets him sweating.  With      this, he denies any chest discomfort.  Therefore, I do not think      any further cardiovascular testing is suggested.  He needs      continued primary risk reduction.  2. Hypertension:  His blood pressure is slightly elevated.  He will      continue the other medications as listed.  I have suggested weight-      loss to bring him down to a target of 120/70.  If  not, he should      have up-titration of his lisinopril per Dr. Hardin Negus.  3. Dyslipidemia:  He has his lipid profile and liver enzymes followed      closely by Dr. Hardin Negus.  The goal would be an LDL less than 70 and      an HDL greater than 40.  4. Erectile dysfunction.  The patient reports this.  I have taken the      liberty of giving him Viagra 50 mg p.r.n.  He knows that he cannot      use this with nitrates.  5. Tobacco:  He understands the need to quit smoking.  I have given      him a starter prescription for Chantix.  He expresses no depression      or suicidal ideation.  6. Followup:  I will see the patient back in 12 months.  For      convenience, I can see him in the [place] office.     Minus Breeding, MD, [facility]  Electronically Signed    JH/MedQ  DD: 02/24/2006  DT: 02/24/2006  Job #: [id number]   cc:   Laurian Brim

## 2010-10-20 LAB — COMPREHENSIVE METABOLIC PANEL
ALT: 16
AST: 32
Albumin: 4.7
Alkaline Phosphatase: 96
BUN: 35 — ABNORMAL HIGH
CO2: 26
Calcium: 10.4
Chloride: 89 — ABNORMAL LOW
Creatinine, Ser: 2.79 — ABNORMAL HIGH
GFR calc Af Amer: 29 — ABNORMAL LOW
GFR calc non Af Amer: 24 — ABNORMAL LOW
Glucose, Bld: 155 — ABNORMAL HIGH
Potassium: 4.8
Sodium: 131 — ABNORMAL LOW
Total Bilirubin: 1.5 — ABNORMAL HIGH
Total Protein: 8.3

## 2010-10-20 LAB — CBC
HCT: 45.2
Hemoglobin: 15.8
MCHC: 35
MCV: 99.7
Platelets: 243
RBC: 4.53
RDW: 12.9
WBC: 13.4 — ABNORMAL HIGH

## 2010-10-20 LAB — POCT CARDIAC MARKERS
CKMB, poc: 1.3
CKMB, poc: 1.8
CKMB, poc: 2.2
Myoglobin, poc: >500
Myoglobin, poc: >500
Myoglobin, poc: >500
Operator id: 257131
Operator id: 282201
Operator id: 282201
Troponin i, poc: <0.05
Troponin i, poc: <0.05
Troponin i, poc: <0.05

## 2010-10-20 LAB — DIFFERENTIAL
Basophils Absolute: 0
Basophils Relative: 0
Eosinophils Absolute: 0.1
Eosinophils Relative: 1
Lymphocytes Relative: 9 — ABNORMAL LOW
Lymphs Abs: 1.1
Monocytes Absolute: 1.2 — ABNORMAL HIGH
Monocytes Relative: 9
Neutro Abs: 10.9 — ABNORMAL HIGH
Neutrophils Relative %: 82 — ABNORMAL HIGH

## 2010-10-20 LAB — APTT: aPTT: 25

## 2010-10-20 LAB — CK: Total CK: 252 — ABNORMAL HIGH

## 2010-12-02 NOTE — Telephone Encounter (Signed)
Please close encounter

## 2011-03-10 ENCOUNTER — Ambulatory Visit: Payer: Medicare Other | Admitting: Cardiology

## 2011-03-29 ENCOUNTER — Ambulatory Visit: Payer: Medicare Other | Admitting: Cardiology

## 2011-05-16 ENCOUNTER — Encounter: Payer: Self-pay | Admitting: Cardiology

## 2011-05-23 ENCOUNTER — Ambulatory Visit (INDEPENDENT_AMBULATORY_CARE_PROVIDER_SITE_OTHER): Payer: Medicare Other | Admitting: Cardiology

## 2011-05-23 ENCOUNTER — Encounter: Payer: Self-pay | Admitting: Cardiology

## 2011-05-23 ENCOUNTER — Encounter: Payer: Self-pay | Admitting: Neurology

## 2011-05-23 VITALS — BP 107/68 | HR 63 | Ht 72.0 in | Wt 232.8 lb

## 2011-05-23 DIAGNOSIS — I2510 Atherosclerotic heart disease of native coronary artery without angina pectoris: Secondary | ICD-10-CM

## 2011-05-23 DIAGNOSIS — R29898 Other symptoms and signs involving the musculoskeletal system: Secondary | ICD-10-CM

## 2011-05-23 DIAGNOSIS — E785 Hyperlipidemia, unspecified: Secondary | ICD-10-CM

## 2011-05-23 DIAGNOSIS — M6281 Muscle weakness (generalized): Secondary | ICD-10-CM

## 2011-05-23 DIAGNOSIS — M6250 Muscle wasting and atrophy, not elsewhere classified, unspecified site: Secondary | ICD-10-CM

## 2011-05-23 DIAGNOSIS — E119 Type 2 diabetes mellitus without complications: Secondary | ICD-10-CM

## 2011-05-23 DIAGNOSIS — I10 Essential (primary) hypertension: Secondary | ICD-10-CM

## 2011-05-23 NOTE — Assessment & Plan Note (Signed)
He has significant hand weakness and muscle wasting. I will refer him to our neurologists.

## 2011-05-23 NOTE — Assessment & Plan Note (Signed)
I reviewed her lipids which included a triglyceride of 313, calculated LDL of 120 and HDL of 35. He does not want to take more medications but does say he will try harder with diet. We will give him literature concerning this.

## 2011-05-23 NOTE — Assessment & Plan Note (Signed)
Per Ronald Lobo, MD

## 2011-05-23 NOTE — Progress Notes (Signed)
   HPI The patient returns for yearly followup. Since I last saw him he has had no new cardiovascular complaints. He did have some chest discomfort and dyspnea last year. However, stress perfusion study demonstrated an EF of 52% with some old inferior scar and only perhaps mild apical ischemia. He was low risk and we followed medically. Since that time he has had no change in these complaints. He's been particularly limited by weakness in his hands. He said some time ago he saw a neurologist in [place] and was told he had a cervical problem but there was no treatment. He now has muscle wasting and has trouble doing things such as holding utensils and opening a jar. He denies any chest pressure, neck or arm discomfort. He's not having any new palpitations, presyncope or syncope. There has been no PND or orthopnea.  He still smokes occasional cigarettes and uses smokeless tobacco routinely.  No Known Allergies  Current Outpatient Prescriptions  Medication Sig Dispense Refill  . aspirin 325 MG tablet Take 325 mg by mouth daily.      . insulin detemir (LEVEMIR) 100 UNIT/ML injection Inject 40 Units into the skin at bedtime.      . metFORMIN (GLUCOPHAGE) 500 MG tablet Take 500 mg by mouth 2 (two) times daily with a meal. Take 2 tablets bid      . metoprolol succinate (TOPROL-XL) 50 MG 24 hr tablet Take 50 mg by mouth 2 (two) times daily. Take with or immediately following a meal.      . rosuvastatin (CRESTOR) 5 MG tablet Take 5 mg by mouth daily.        Past Medical History  Diagnosis Date  . Coronary artery disease   . Hypertension   . Diabetes mellitus   . Cardiac LV ejection fraction 21-40%   . Dyslipidemia     Past Surgical History  Procedure Date  . Coronary artery bypass graft     (CABG with LIMA to the    . Right lower leg surgery   . Left ankle surgery     ROS:  As stated in the HPI and negative for all other systems.  PHYSICAL EXAM BP 107/68  Pulse 63  Ht 6' (1.829 m)  Wt  232 lb 12.8 oz (105.597 kg)  BMI 31.57 kg/m2 GENERAL:  Well appearing HEENT:  Pupils equal round and reactive, fundi not visualized, oral mucosa unremarkable, dentures NECK:  No jugular venous distention, waveform within normal limits, carotid upstroke brisk and symmetric, no bruits, no thyromegaly LYMPHATICS:  No cervical, inguinal adenopathy LUNGS:  Clear to auscultation bilaterally BACK:  No CVA tenderness CHEST:  Well healed sternotomy scar. HEART:  PMI not displaced or sustained,S1 and S2 within normal limits, no S3, no S4, no clicks, no rubs, no murmurs ABD:  Flat, positive bowel sounds normal in frequency in pitch, no bruits, no rebound, no guarding, no midline pulsatile mass, no hepatomegaly, no splenomegaly EXT:  2 plus pulses throughout, no edema, no cyanosis no clubbing SKIN:  No rashes, lipomas on the back NEURO:  Cranial nerves II through XII grossly intact, motor grossly intact throughout, thenar eminence muscle wasting PSYCH:  Cognitively intact, oriented to person place and time  EKG:  Sinus rhythm, rate 63, axis within normal limits, intervals within normal limits, no acute ST-T wave changes.  05/23/2011   ASSESSMENT AND PLAN

## 2011-05-23 NOTE — Assessment & Plan Note (Signed)
The patient has no new sypmtoms.  No further cardiovascular testing is indicated.  We will continue with aggressive risk reduction and meds as listed.  

## 2011-05-23 NOTE — Assessment & Plan Note (Signed)
The blood pressure is at target. No change in medications is indicated. We will continue with therapeutic lifestyle changes (TLC).  

## 2011-05-23 NOTE — Patient Instructions (Addendum)
The current medical regimen is effective;  continue present plan and medications.  You have been referred to neurology for the evaluation of muscle wasting in hands.  Follow up in 1 year with Dr Percival Spanish.  You will receive a letter in the mail 2 months before you are due.  Please call us when you receive this letter to schedule your follow up appointment. Hypertriglyceridemia  Diet for High blood levels of Triglycerides Most fats in food are triglycerides. Triglycerides in your blood are stored as fat in your body. High levels of triglycerides in your blood may put you at a greater risk for heart disease and stroke.  Normal triglyceride levels are less than 150 mg/dL. Borderline high levels are 150-199 mg/dl. High levels are 200 - 499 mg/dL, and very high triglyceride levels are greater than 500 mg/dL. The decision to treat high triglycerides is generally based on the level. For people with borderline or high triglyceride levels, treatment includes weight loss and exercise. Drugs are recommended for people with very high triglyceride levels. Many people who need treatment for high triglyceride levels have metabolic syndrome. This syndrome is a collection of disorders that often include: insulin resistance, high blood pressure, blood clotting problems, high cholesterol and triglycerides. TESTING PROCEDURE FOR TRIGLYCERIDES  You should not eat 4 hours before getting your triglycerides measured. The normal range of triglycerides is between 10 and 250 milligrams per deciliter (mg/dl). Some people may have extreme levels (1000 or above), but your triglyceride level may be too high if it is above 150 mg/dl, depending on what other risk factors you have for heart disease.   People with high blood triglycerides may also have high blood cholesterol levels. If you have high blood cholesterol as well as high blood triglycerides, your risk for heart disease is probably greater than if you only had high  triglycerides. High blood cholesterol is one of the main risk factors for heart disease.  CHANGING YOUR DIET  Your weight can affect your blood triglyceride level. If you are more than 20% above your ideal body weight, you may be able to lower your blood triglycerides by losing weight. Eating less and exercising regularly is the best way to combat this. Fat provides more calories than any other food. The best way to lose weight is to eat less fat. Only 30% of your total calories should come from fat. Less than 7% of your diet should come from saturated fat. A diet low in fat and saturated fat is the same as a diet to decrease blood cholesterol. By eating a diet lower in fat, you may lose weight, lower your blood cholesterol, and lower your blood triglyceride level.  Eating a diet low in fat, especially saturated fat, may also help you lower your blood triglyceride level. Ask your dietitian to help you figure how much fat you can eat based on the number of calories your caregiver has prescribed for you.  Exercise, in addition to helping with weight loss may also help lower triglyceride levels.   Alcohol can increase blood triglycerides. You may need to stop drinking alcoholic beverages.   Too much carbohydrate in your diet may also increase your blood triglycerides. Some complex carbohydrates are necessary in your diet. These may include bread, rice, potatoes, other starchy vegetables and cereals.   Reduce "simple" carbohydrates. These may include pure sugars, candy, honey, and jelly without losing other nutrients. If you have the kind of high blood triglycerides that is affected by the amount of  carbohydrates in your diet, you will need to eat less sugar and less high-sugar foods. Your caregiver can help you with this.   Adding 2-4 grams of fish oil (EPA+ DHA) may also help lower triglycerides. Speak with your caregiver before adding any supplements to your regimen.  Following the Diet  Maintain your  ideal weight. Your caregivers can help you with a diet. Generally, eating less food and getting more exercise will help you lose weight. Joining a weight control group may also help. Ask your caregivers for a good weight control group in your area.  Eat low-fat foods instead of high-fat foods. This can help you lose weight too.  These foods are lower in fat. Eat MORE of these:   Dried beans, peas, and lentils.   Egg whites.   Low-fat cottage cheese.   Fish.   Lean cuts of meat, such as round, sirloin, rump, and flank (cut extra fat off meat you fix).   Whole grain breads, cereals and pasta.   Skim and nonfat dry milk.   Low-fat yogurt.   Poultry without the skin.   Cheese made with skim or part-skim milk, such as mozzarella, parmesan, farmers', ricotta, or pot cheese.  These are higher fat foods. Eat LESS of these:   Whole milk and foods made from whole milk, such as American, blue, cheddar, monterey jack, and swiss cheese   High-fat meats, such as luncheon meats, sausages, knockwurst, bratwurst, hot dogs, ribs, corned beef, ground pork, and regular ground beef.   Fried foods.  Limit saturated fats in your diet. Substituting unsaturated fat for saturated fat may decrease your blood triglyceride level. You will need to read package labels to know which products contain saturated fats.  These foods are high in saturated fat. Eat LESS of these:   Fried pork skins.   Whole milk.   Skin and fat from poultry.   Palm oil.   Butter.   Shortening.   Cream cheese.   Berniece Salines.   Margarines and baked goods made from listed oils.   Vegetable shortenings.   Chitterlings.   Fat from meats.   Coconut oil.   Palm kernel oil.   Lard.   Cream.   Sour cream.   Fatback.   Coffee whiteners and non-dairy creamers made with these oils.   Cheese made from whole milk.  Use unsaturated fats (both polyunsaturated and monounsaturated) moderately. Remember, even though  unsaturated fats are better than saturated fats; you still want a diet low in total fat.  These foods are high in unsaturated fat:   Canola oil.   Sunflower oil.   Mayonnaise.   Almonds.   Peanuts.   Pine nuts.   Margarines made with these oils.   Safflower oil.   Olive oil.   Avocados.   Cashews.   Peanut butter.   Sunflower seeds.   Soybean oil.   Peanut oil.   Olives.   Pecans.   Walnuts.   Pumpkin seeds.  Avoid sugar and other high-sugar foods. This will decrease carbohydrates without decreasing other nutrients. Sugar in your food goes rapidly to your blood. When there is excess sugar in your blood, your liver may use it to make more triglycerides. Sugar also contains calories without other important nutrients.  Eat LESS of these:   Sugar, brown sugar, powdered sugar, jam, jelly, preserves, honey, syrup, molasses, pies, candy, cakes, cookies, frosting, pastries, colas, soft drinks, punches, fruit drinks, and regular gelatin.   Avoid alcohol. Alcohol, even more  than sugar, may increase blood triglycerides. In addition, alcohol is high in calories and low in nutrients. Ask for sparkling water, or a diet soft drink instead of an alcoholic beverage.  Suggestions for planning and preparing meals   Bake, broil, grill or roast meats instead of frying.   Remove fat from meats and skin from poultry before cooking.   Add spices, herbs, lemon juice or vinegar to vegetables instead of salt, rich sauces or gravies.   Use a non-stick skillet without fat or use no-stick sprays.   Cool and refrigerate stews and broth. Then remove the hardened fat floating on the surface before serving.   Refrigerate meat drippings and skim off fat to make low-fat gravies.   Serve more fish.   Use less butter, margarine and other high-fat spreads on bread or vegetables.   Use skim or reconstituted non-fat dry milk for cooking.   Cook with low-fat cheeses.   Substitute low-fat  yogurt or cottage cheese for all or part of the sour cream in recipes for sauces, dips or congealed salads.   Use half yogurt/half mayonnaise in salad recipes.   Substitute evaporated skim milk for cream. Evaporated skim milk or reconstituted non-fat dry milk can be whipped and substituted for whipped cream in certain recipes.   Choose fresh fruits for dessert instead of high-fat foods such as pies or cakes. Fruits are naturally low in fat.  When Dining Out   Order low-fat appetizers such as fruit or vegetable juice, pasta with vegetables or tomato sauce.   Select clear, rather than cream soups.   Ask that dressings and gravies be served on the side. Then use less of them.   Order foods that are baked, broiled, poached, steamed, stir-fried, or roasted.   Ask for margarine instead of butter, and use only a small amount.   Drink sparkling water, unsweetened tea or coffee, or diet soft drinks instead of alcohol or other sweet beverages.  QUESTIONS AND ANSWERS ABOUT OTHER FATS IN THE BLOOD: SATURATED FAT, TRANS FAT, AND CHOLESTEROL What is trans fat? Trans fat is a type of fat that is formed when vegetable oil is hardened through a process called hydrogenation. This process helps makes foods more solid, gives them shape, and prolongs their shelf life. Trans fats are also called hydrogenated or partially hydrogenated oils.  What do saturated fat, trans fat, and cholesterol in foods have to do with heart disease? Saturated fat, trans fat, and cholesterol in the diet all raise the level of LDL "bad" cholesterol in the blood. The higher the LDL cholesterol, the greater the risk for coronary heart disease (CHD). Saturated fat and trans fat raise LDL similarly.  What foods contain saturated fat, trans fat, and cholesterol? High amounts of saturated fat are found in animal products, such as fatty cuts of meat, chicken skin, and full-fat dairy products like butter, whole milk, cream, and cheese, and in  tropical vegetable oils such as palm, palm kernel, and coconut oil. Trans fat is found in some of the same foods as saturated fat, such as vegetable shortening, some margarines (especially hard or stick margarine), crackers, cookies, baked goods, fried foods, salad dressings, and other processed foods made with partially hydrogenated vegetable oils. Small amounts of trans fat also occur naturally in some animal products, such as milk products, beef, and lamb. Foods high in cholesterol include liver, other organ meats, egg yolks, shrimp, and full-fat dairy products. How can I use the new food label to make heart-healthy  food choices? Check the Nutrition Facts panel of the food label. Choose foods lower in saturated fat, trans fat, and cholesterol. For saturated fat and cholesterol, you can also use the Percent Daily Value (%DV): 5% DV or less is low, and 20% DV or more is high. (There is no %DV for trans fat.) Use the Nutrition Facts panel to choose foods low in saturated fat and cholesterol, and if the trans fat is not listed, read the ingredients and limit products that list shortening or hydrogenated or partially hydrogenated vegetable oil, which tend to be high in trans fat. POINTS TO REMEMBER: YOU NEED A LITTLE TLC (THERAPEUTIC LIFESTYLE CHANGES)  Discuss your risk for heart disease with your caregivers, and take steps to reduce risk factors.   Change your diet. Choose foods that are low in saturated fat, trans fat, and cholesterol.   Add exercise to your daily routine if it is not already being done. Participate in physical activity of moderate intensity, like brisk walking, for at least 30 minutes on most, and preferably all days of the week. No time? Break the 30 minutes into three, 10-minute segments during the day.   Stop smoking. If you do smoke, contact your caregiver to discuss ways in which they can help you quit.   Do not use street drugs.   Maintain a normal weight.   Maintain a  healthy blood pressure.   Keep up with your blood work for checking the fats in your blood as directed by your caregiver.  Document Released: 10/08/2003 Document Revised: 12/09/2010 Document Reviewed: 05/05/2008 [facility] Patient Information 2012 [place].

## 2011-07-25 ENCOUNTER — Ambulatory Visit: Payer: Medicare Other | Admitting: Neurology

## 2012-05-24 ENCOUNTER — Ambulatory Visit (INDEPENDENT_AMBULATORY_CARE_PROVIDER_SITE_OTHER): Payer: Medicare Other | Admitting: Cardiology

## 2012-05-24 ENCOUNTER — Encounter: Payer: Self-pay | Admitting: Cardiology

## 2012-05-24 VITALS — BP 114/69 | HR 62 | Ht 72.0 in | Wt 237.8 lb

## 2012-05-24 DIAGNOSIS — E785 Hyperlipidemia, unspecified: Secondary | ICD-10-CM

## 2012-05-24 DIAGNOSIS — I10 Essential (primary) hypertension: Secondary | ICD-10-CM

## 2012-05-24 DIAGNOSIS — M542 Cervicalgia: Secondary | ICD-10-CM

## 2012-05-24 DIAGNOSIS — R0602 Shortness of breath: Secondary | ICD-10-CM

## 2012-05-24 DIAGNOSIS — I2510 Atherosclerotic heart disease of native coronary artery without angina pectoris: Secondary | ICD-10-CM

## 2012-05-24 LAB — BRAIN NATRIURETIC PEPTIDE: Pro B Natriuretic peptide (BNP): 125 pg/mL — ABNORMAL HIGH (ref 0.0–100.0)

## 2012-05-24 NOTE — Progress Notes (Signed)
HPI The patient returns for yearly followup. Since I last saw him he has had no new cardiovascular complaints. He did have some chest discomfort and dyspnea in 2012.  However, stress perfusion study demonstrated an EF of 52% with some old inferior scar and only perhaps mild apical ischemia. He was low risk and we followed medically. Since that time he has had no change in these complaints.  He still gets SOB with activity but this is not changed or perhaps slightly worse.  He has no PND or orthopnea.  He denies any chest pressure, neck or arm discomfort. He's not having any new palpitations, presyncope or syncope. There has been no PND or orthopnea.  He still chews on cigars and uses smokeless tobacco routinely.   He continues to be limited by  weakness in his hands. He said some time ago he saw a neurologist in [place] and was told he had a cervical problem but there was no treatment. He has progressive muscle wasting.  I set him up to see a neurologist last year but he missed his appointment.  No Known Allergies  Current Outpatient Prescriptions  Medication Sig Dispense Refill  . ALPRAZolam (XANAX) 1 MG tablet       . aspirin 325 MG tablet Take 325 mg by mouth daily.      [name] glipiZIDE (GLUCOTROL) 5 MG tablet       . insulin detemir (LEVEMIR) 100 UNIT/ML injection Inject 40 Units into the skin at bedtime.      . metFORMIN (GLUCOPHAGE) 500 MG tablet Take 500 mg by mouth 2 (two) times daily with a meal. Take 2 tablets bid      . metoprolol succinate (TOPROL-XL) 50 MG 24 hr tablet Take 50 mg by mouth 2 (two) times daily. Take with or immediately following a meal.      . rosuvastatin (CRESTOR) 5 MG tablet Take 5 mg by mouth daily.       No current facility-administered medications for this visit.    Past Medical History  Diagnosis Date  . Coronary artery disease   . Hypertension   . Diabetes mellitus   . Cardiac LV ejection fraction 21-40%   . Dyslipidemia     Past Surgical History    Procedure Laterality Date  . Coronary artery bypass graft      (CABG with LIMA to the  LAD, SVG to OM2/OM3, SVG  to diag  . Right lower leg surgery      rod  . Left ankle surgery      repair of fracture    ROS:  As stated in the HPI and negative for all other systems.  PHYSICAL EXAM BP 114/69  Pulse 62  Ht 6' (1.829 m)  Wt 237 lb 12.8 oz (107.865 kg)  BMI 32.24 kg/m2 GENERAL:  Well appearing HEENT:  Pupils equal round and reactive, fundi not visualized, oral mucosa unremarkable, dentures NECK:  No jugular venous distention, waveform within normal limits, carotid upstroke brisk and symmetric, no bruits, no thyromegaly LYMPHATICS:  No cervical, inguinal adenopathy LUNGS:   Mild expiratory wheezing BACK:  No CVA tenderness CHEST:  Well healed sternotomy scar. HEART:  PMI not displaced or sustained,S1 and S2 within normal limits, no S3, no S4, no clicks, no rubs, no murmurs ABD:  Flat, positive bowel sounds normal in frequency in pitch, no bruits, no rebound, no guarding, no midline pulsatile mass, no hepatomegaly, no splenomegaly EXT:  2 plus pulses left ulnar, 1 plus right  radial and absent left radial, mildly decreased DP/PT bilaterally, mild left greater than right ankle edema, no cyanosis no clubbing SKIN:  No rashes, lipomas on the back NEURO:  Cranial nerves II through XII grossly intact, motor grossly intact throughout, thenar eminence muscle wasting   EKG:  Sinus rhythm, rate 62, axis within normal limits, intervals within normal limits, no acute ST-T wave changes.  05/24/2012   ASSESSMENT AND PLAN  CAD:  The patient has no new sypmtoms.  No further cardiovascular testing is indicated.  We will continue with aggressive risk reduction and meds as listed.  HTN:  The blood pressure is at target. No change in medications is indicated. We will continue with therapeutic lifestyle changes (TLC).  HYPERLIPIDEMIA:  He has this followed by Ronald Lobo, MD.  I will defer to  his management.  HAND WEAKNESS:  I continue to be concerned about this progressive problem. Given some discomfort he is having down his arms along with the weakness and also some neck pains I'm going to send him to see neurosurgery.  TOBACCO USE:  I have discussed the need to stop using all tobacco products.  DYSPNEA:  I am going to check a BNP level.  I do not suspect an anginal equivalent.

## 2012-05-24 NOTE — Patient Instructions (Addendum)
The current medical regimen is effective;  continue present plan and medications.  Please have blood work today  (BNP)  You have been referred to neurosurgery for the evaluation of the loss of use of your hands and neck pain.  Please stop smoking.  Follow up in 1 year with Dr Percival Spanish.  You will receive a letter in the mail 2 months before you are due.  Please call us when you receive this letter to schedule your follow up appointment.  Smoking Cessation Quitting smoking is important to your health and has many advantages. However, it is not always easy to quit since nicotine is a very addictive drug. Often times, people try 3 times or more before being able to quit. This document explains the best ways for you to prepare to quit smoking. Quitting takes hard work and a lot of effort, but you can do it. ADVANTAGES OF QUITTING SMOKING  You will live longer, feel better, and live better.  Your body will feel the impact of quitting smoking almost immediately.  Within 20 minutes, blood pressure decreases. Your pulse returns to its normal level.  After 8 hours, carbon monoxide levels in the blood return to normal. Your oxygen level increases.  After 24 hours, the chance of having a heart attack starts to decrease. Your breath, hair, and body stop smelling like smoke.  After 48 hours, damaged nerve endings begin to recover. Your sense of taste and smell improve.  After 72 hours, the body is virtually free of nicotine. Your bronchial tubes relax and breathing becomes easier.  After 2 to 12 weeks, lungs can hold more air. Exercise becomes easier and circulation improves.  The risk of having a heart attack, stroke, cancer, or lung disease is greatly reduced.  After 1 year, the risk of coronary heart disease is cut in half.  After 5 years, the risk of stroke falls to the same as a nonsmoker.  After 10 years, the risk of lung cancer is cut in half and the risk of other cancers decreases  significantly.  After 15 years, the risk of coronary heart disease drops, usually to the level of a nonsmoker.  If you are pregnant, quitting smoking will improve your chances of having a healthy baby.  The people you live with, especially any children, will be healthier.  You will have extra money to spend on things other than cigarettes. QUESTIONS TO THINK ABOUT BEFORE ATTEMPTING TO QUIT You may want to talk about your answers with your caregiver.  Why do you want to quit?  If you tried to quit in the past, what helped and what did not?  What will be the most difficult situations for you after you quit? How will you plan to handle them?  Who can help you through the tough times? Your family? Friends? A caregiver?  What pleasures do you get from smoking? What ways can you still get pleasure if you quit? Here are some questions to ask your caregiver:  How can you help me to be successful at quitting?  What medicine do you think would be best for me and how should I take it?  What should I do if I need more help?  What is smoking withdrawal like? How can I get information on withdrawal? GET READY  Set a quit date.  Change your environment by getting rid of all cigarettes, ashtrays, matches, and lighters in your home, car, or work. Do not let people smoke in your home.  Review your past attempts to quit. Think about what worked and what did not. GET SUPPORT AND ENCOURAGEMENT You have a better chance of being successful if you have help. You can get support in many ways.  Tell your family, friends, and co-workers that you are going to quit and need their support. Ask them not to smoke around you.  Get individual, group, or telephone counseling and support. Programs are available at [employer] and health centers. Call your local health department for information about programs in your area.  Spiritual beliefs and practices may help some smokers quit.  Download a "quit  meter" on your computer to keep track of quit statistics, such as how long you have gone without smoking, cigarettes not smoked, and money saved.  Get a self-help book about quitting smoking and staying off of tobacco. [place] yourself from urges to smoke. Talk to someone, go for a walk, or occupy your time with a task.  Change your normal routine. Take a different route to work. Drink tea instead of coffee. Eat breakfast in a different place.  Reduce your stress. Take a hot bath, exercise, or read a book.  Plan something enjoyable to do every day. Reward yourself for not smoking.  Explore interactive web-based programs that specialize in helping you quit. GET MEDICINE AND USE IT CORRECTLY Medicines can help you stop smoking and decrease the urge to smoke. Combining medicine with the above behavioral methods and support can greatly increase your chances of successfully quitting smoking.  Nicotine replacement therapy helps deliver nicotine to your body without the negative effects and risks of smoking. Nicotine replacement therapy includes nicotine gum, lozenges, inhalers, nasal sprays, and skin patches. Some may be available over-the-counter and others require a prescription.  Antidepressant medicine helps people abstain from smoking, but how this works is unknown. This medicine is available by prescription.  Nicotinic receptor partial agonist medicine simulates the effect of nicotine in your brain. This medicine is available by prescription. Ask your caregiver for advice about which medicines to use and how to use them based on your health history. Your caregiver will tell you what side effects to look out for if you choose to be on a medicine or therapy. Carefully read the information on the package. Do not use any other product containing nicotine while using a nicotine replacement product.  RELAPSE OR DIFFICULT SITUATIONS Most relapses occur within the  first 3 months after quitting. Do not be discouraged if you start smoking again. Remember, most people try several times before finally quitting. You may have symptoms of withdrawal because your body is used to nicotine. You may crave cigarettes, be irritable, feel very hungry, cough often, get headaches, or have difficulty concentrating. The withdrawal symptoms are only temporary. They are strongest when you first quit, but they will go away within 10 14 days. To reduce the chances of relapse, try to:  Avoid drinking alcohol. Drinking lowers your chances of successfully quitting.  Reduce the amount of caffeine you consume. Once you quit smoking, the amount of caffeine in your body increases and can give you symptoms, such as a rapid heartbeat, sweating, and anxiety.  Avoid smokers because they can make you want to smoke.  Do not let weight gain distract you. Many smokers will gain weight when they quit, usually less than 10 pounds. Eat a healthy diet and stay active. You can always lose the weight gained after you quit.  Find ways to  improve your mood other than smoking. FOR MORE INFORMATION  www.smokefree.gov  Document Released: 12/14/2000 Document Revised: 06/21/2011 Document Reviewed: 03/31/2011 [facility] Patient Information 2014 [place], [place].

## 2012-06-22 ENCOUNTER — Other Ambulatory Visit: Payer: Self-pay | Admitting: Neurological Surgery

## 2012-06-22 DIAGNOSIS — M5412 Radiculopathy, cervical region: Secondary | ICD-10-CM

## 2012-07-02 ENCOUNTER — Ambulatory Visit
Admission: RE | Admit: 2012-07-02 | Discharge: 2012-07-02 | Disposition: A | Discharge status: Home / Self Care | Payer: Medicare Other | Source: Ambulatory Visit | Attending: Neurological Surgery | Admitting: Neurological Surgery

## 2012-07-02 DIAGNOSIS — M5412 Radiculopathy, cervical region: Secondary | ICD-10-CM

## 2014-03-07 ENCOUNTER — Ambulatory Visit (INDEPENDENT_AMBULATORY_CARE_PROVIDER_SITE_OTHER): Payer: Medicare Other | Admitting: Cardiology

## 2014-03-07 ENCOUNTER — Encounter: Payer: Self-pay | Admitting: Cardiology

## 2014-03-07 VITALS — BP 138/70 | HR 64 | Ht 71.0 in | Wt 225.1 lb

## 2014-03-07 DIAGNOSIS — Z01810 Encounter for preprocedural cardiovascular examination: Secondary | ICD-10-CM | POA: Diagnosis not present

## 2014-03-07 NOTE — Progress Notes (Signed)
HPI The patient returns for yearly followup. Since I last saw him he has had no new cardiovascular complaints. He did have some chest discomfort and dyspnea in 2012.  However, stress perfusion study demonstrated an EF of 52% with some old inferior scar and only perhaps mild apical ischemia. He was low risk and we followed medically.   Since I last saw him he has done well. He is limited by some joint pain but can still exercise on a treadmill. With this he denies any chest pressure, neck or arm discomfort. He doesn't have any palpitations, presyncope or syncope. His shortness of breath which he had last year is improved as he stop smoking and has lost weight. He denies any PND or orthopnea.   No Known Allergies  Current Outpatient Prescriptions  Medication Sig Dispense Refill  . ALPRAZolam (XANAX) 1 MG tablet     . aspirin 325 MG tablet Take 325 mg by mouth daily.    . CRESTOR 10 MG tablet Take 10 mg by mouth daily.  4  . glipiZIDE (GLUCOTROL) 5 MG tablet     . insulin detemir (LEVEMIR) 100 UNIT/ML injection Inject 40 Units into the skin at bedtime.    . metFORMIN (GLUCOPHAGE) 500 MG tablet Take 500 mg by mouth 2 (two) times daily with a meal. Take 2 tablets bid    . metoprolol (LOPRESSOR) 100 MG tablet Take 50 mg by mouth 2 (two) times daily.  2  . triamcinolone cream (KENALOG) 0.1 % Apply 1 application topically daily.  2   No current facility-administered medications for this visit.    Past Medical History  Diagnosis Date  . Coronary artery disease   . Hypertension   . Diabetes mellitus   . Cardiac LV ejection fraction 21-40%   . Dyslipidemia     Past Surgical History  Procedure Laterality Date  . Coronary artery bypass graft      (CABG with LIMA to the  LAD, SVG to OM2/OM3, SVG  to diag  . Right lower leg surgery      rod  . Left ankle surgery      repair of fracture    ROS:  As stated in the HPI and negative for all other systems.  PHYSICAL EXAM BP 138/70 mmHg   Pulse 64  Ht 5\' 11"  (1.803 m)  Wt 225 lb 1.6 oz (102.105 kg)  BMI 31.41 kg/m2 GENERAL:  Well appearing HEENT:  Pupils equal round and reactive, fundi not visualized, oral mucosa unremarkable, dentures NECK:  No jugular venous distention, waveform within normal limits, carotid upstroke brisk and symmetric, no bruits, no thyromegaly LYMPHATICS:  No cervical, inguinal adenopathy LUNGS:   Mild expiratory wheezing BACK:  No CVA tenderness CHEST:  Well healed sternotomy scar. HEART:  PMI not displaced or sustained,S1 and S2 within normal limits, no S3, no S4, no clicks, no rubs, no murmurs ABD:  Flat, positive bowel sounds normal in frequency in pitch, no bruits, no rebound, no guarding, no midline pulsatile mass, no hepatomegaly, no splenomegaly EXT:  2 plus pulses left ulnar, 1 plus right radial and absent left radial, mildly decreased DP/PT bilaterally, mild left greater than right ankle edema, no cyanosis no clubbing SKIN:  No rashes, lipomas on the back NEURO:  Cranial nerves II through XII grossly intact, motor grossly intact throughout, thenar eminence muscle wasting   EKG:  Sinus rhythm, rate 64, axis within normal limits, intervals within normal limits, no acute ST-T wave changes.  03/07/2014  ASSESSMENT AND PLAN  CAD:  The patient has no new sypmtoms.  No further cardiovascular testing is indicated.  We will continue with aggressive risk reduction and meds as listed.  I will likely order stress testing when I see him again next year.  HTN:  The blood pressure is at target. No change in medications is indicated. We will continue with therapeutic lifestyle changes (TLC).  HYPERLIPIDEMIA:  He has this followed by Morton Peters, MD.  He reports he recently had his Crestor adjusted.  HAND WEAKNESS:  He did have this addressed by a neurosurgeon and was offered cervical neck surgery but declined.  TOBACCO USE:  I have discussed the need to stop using all tobacco products.  He has  stopped smoking which I thought he had done before. He still using a little smokeless tobacco.  PREOP:    He's going to have some cysts or lipomas removed from his back. He would be acceptable procedure from a cardiovascular standpoint as it is not high risk. He has a high functional status and no high risk findings.

## 2014-03-07 NOTE — Patient Instructions (Signed)
Your physician recommends that you schedule a follow-up appointment in: one year with Dr. Hochrein  

## 2014-03-10 ENCOUNTER — Encounter: Payer: Self-pay | Admitting: Cardiology

## 2014-03-18 ENCOUNTER — Ambulatory Visit: Payer: Self-pay | Admitting: Surgery

## 2014-03-21 ENCOUNTER — Telehealth: Payer: Self-pay | Admitting: Cardiology

## 2014-03-21 NOTE — Telephone Encounter (Signed)
Spoke with pt, he is having some growths removed from his back under anesthesia. Will forward for dr hochrein's review

## 2014-03-21 NOTE — Telephone Encounter (Signed)
Pt called in wanting to know if it will be ok for him to hold his aspirin because he will be having some surgery on 3/23. Please f/u with pt  Thanks

## 2014-03-24 NOTE — Telephone Encounter (Signed)
He only needs to hold the ASA if the surgeon wants him to hold it.

## 2014-03-24 NOTE — Telephone Encounter (Signed)
Spoke with pt, aware of dr hochrein's recommendations. He reported they ask him to hold the aspirin.

## 2014-03-26 ENCOUNTER — Ambulatory Visit: Payer: Self-pay | Admitting: Surgery

## 2014-04-04 LAB — FECAL OCCULT BLOOD, GUAIAC: Fecal Occult Blood: NEGATIVE

## 2014-04-21 ENCOUNTER — Ambulatory Visit
Admit: 2014-04-21 | Disposition: A | Discharge status: Home / Self Care | Payer: Self-pay | Attending: Surgery | Admitting: Surgery

## 2014-04-28 LAB — SURGICAL PATHOLOGY

## 2014-05-04 NOTE — Op Note (Signed)
PATIENT NAME:  Craig Koch, Craig Koch MR#:  [id number] DATE OF BIRTH:  [birth year]  DATE OF PROCEDURE:  04/21/2014  PREOPERATIVE DIAGNOSIS:  Sebaceous cysts of upper back, lipomas upper back.  POSTOPERATIVE DIAGNOSES:  Sebaceous cysts x 3.   PROCEDURE PERFORMED:  Excision of sebaceous cysts x 3 on upper back with complex closure.   SURGEON:  Hortencia Conradi, M.D.   ASSISTANT:  None.   ANESTHESIA:  General with local.   FINDINGS:  There were 2 massive sebaceous cysts measuring at least 10 x 10 cm on the upper mid back as well as the upper left lateral back.  There was a smaller sebaceous cyst over the left scapula that had been incised and drained in the past.   SPECIMENS:  As described above.   ESTIMATED BLOOD LOSS:  25 mL.   DRAINS:  None.  Lap and needle count correct x 2.   DESCRIPTION OF PROCEDURE:  With informed consent, supine position on the stretcher and  general endotracheal anesthesia, the patient was then positioned and padded in jack prone position.  The back was sterilely prepped and draped with ChloraPrep solution.  A timeout was observed.   Attention was first turned to the mid upper back mass.  Transverse skin incision was fashioned.  Findings did not include a lipoma.  There was an adherent sebaceous cyst to the dermis. Sebaceous cyst capsule was ruptured.  Sebaceous material was evacuated.  The cyst was then removed in a piecemeal fashion utilizing sharp dissection and electrocautery.  It was submitted as specimen.  Excess skin was ellipsed.  Hemostasis was ensured.  10 mL of local anesthesia was infiltrated.  With all cyst cavity contents being evacuated from the wound, the wound was then closed in multiple layers of 3-0 Vicryl followed by skin staples.    Attention was then turned to the other larger sebaceous cyst on the left upper back.  Elliptical incision encompassing a small amount of skin was fashioned.  With this site, the cyst was not violated until the very end aof  dissection and then only at its base.  Cyst capsule was excised in its entirety and submitted as specimen, hemostasis being obtained with point cautery.  10 mL of 0.25% plain Marcaine was infiltrated.  The wound was then reapproximated in the deep layer with 3-0 Vicryl suture and skin staples at the skin edges.   The left upper back cyst was then excised in elliptical fashion.  10 mL of 0.25% plain Marcaine was infiltrated.  The wound was then similarly closed.  The left upper shoulder incision was 3 cm.  The left mid back incision was 15 cm and the left upper back skin incision was 10 cm. Sterile dressings were then applied and the patient was then subsequently returned supine, extubated, and taken to the recovery room in stable and satisfactory condition by anesthesia services.     ____________________________ Jeannette How Marina Gravel, MD Andy.Dose D: 04/21/2014 09:36:44 ET T: 04/21/2014 16:11:57 ET JOB#: [id number]  cc: Elta Guadeloupe A. Marina Gravel, MD, <Dictator> Raevin Wierenga A Pearson Reasons MD ELECTRONICALLY SIGNED 04/22/2014 8:26

## 2015-02-04 ENCOUNTER — Telehealth: Payer: Self-pay

## 2015-02-04 NOTE — Telephone Encounter (Signed)
30 min when possible, before running out of meds.  Thanks.

## 2015-02-04 NOTE — Telephone Encounter (Signed)
Pt is coming to [place] from [place] [facility] and has new pt appt scheduled for 8/1.  He will be out of meds soon and can not get refills from Dr Hardin Negus.  Do you want to see him sooner? His son is Kamani Lewter and is one of your patients. Please advise.  [phone] Thank you.

## 2015-02-04 NOTE — Telephone Encounter (Signed)
Changed appt to 02/14 Pt aware

## 2015-02-17 ENCOUNTER — Ambulatory Visit (INDEPENDENT_AMBULATORY_CARE_PROVIDER_SITE_OTHER): Payer: Medicare Other | Admitting: Family Medicine

## 2015-02-17 ENCOUNTER — Encounter: Payer: Self-pay | Admitting: Family Medicine

## 2015-02-17 VITALS — BP 122/70 | HR 64 | Temp 97.8°F | Ht 71.0 in | Wt 221.2 lb

## 2015-02-17 DIAGNOSIS — E119 Type 2 diabetes mellitus without complications: Secondary | ICD-10-CM | POA: Diagnosis not present

## 2015-02-17 DIAGNOSIS — F411 Generalized anxiety disorder: Secondary | ICD-10-CM | POA: Diagnosis not present

## 2015-02-17 DIAGNOSIS — I2510 Atherosclerotic heart disease of native coronary artery without angina pectoris: Secondary | ICD-10-CM

## 2015-02-17 DIAGNOSIS — E118 Type 2 diabetes mellitus with unspecified complications: Secondary | ICD-10-CM | POA: Diagnosis not present

## 2015-02-17 DIAGNOSIS — E785 Hyperlipidemia, unspecified: Secondary | ICD-10-CM

## 2015-02-17 LAB — MICROALBUMIN / CREATININE URINE RATIO
Creatinine,U: 204.6 mg/dL
Microalb Creat Ratio: 4.8 mg/g (ref 0.0–30.0)
Microalb, Ur: 9.9 mg/dL — ABNORMAL HIGH (ref 0.0–1.9)

## 2015-02-17 LAB — COMPREHENSIVE METABOLIC PANEL
ALT: 22 U/L (ref 0–53)
AST: 20 U/L (ref 0–37)
Albumin: 4.5 g/dL (ref 3.5–5.2)
Alkaline Phosphatase: 76 U/L (ref 39–117)
BUN: 20 mg/dL (ref 6–23)
CO2: 30 mEq/L (ref 19–32)
Calcium: 9.8 mg/dL (ref 8.4–10.5)
Chloride: 98 mEq/L (ref 96–112)
Creatinine, Ser: 0.91 mg/dL (ref 0.40–1.50)
GFR: 90.35 mL/min (ref >60.00)
Glucose, Bld: 230 mg/dL — ABNORMAL HIGH (ref 70–99)
Potassium: 4.3 mEq/L (ref 3.5–5.1)
Sodium: 136 mEq/L (ref 135–145)
Total Bilirubin: 0.6 mg/dL (ref 0.2–1.2)
Total Protein: 7.7 g/dL (ref 6.0–8.3)

## 2015-02-17 LAB — LIPID PANEL
Cholesterol: 159 mg/dL (ref 0–200)
HDL: 47.5 mg/dL (ref >39.00)
LDL Cholesterol: 75 mg/dL (ref 0–99)
NonHDL: 111.6
Total CHOL/HDL Ratio: 3
Triglycerides: 184 mg/dL — ABNORMAL HIGH (ref 0.0–149.0)
VLDL: 36.8 mg/dL (ref 0.0–40.0)

## 2015-02-17 LAB — HEMOGLOBIN A1C: Hgb A1c MFr Bld: 10 % — ABNORMAL HIGH (ref 4.6–6.5)

## 2015-02-17 MED ORDER — METOPROLOL TARTRATE 100 MG PO TABS: 50.0000 mg | ORAL_TABLET | Freq: Two times a day (BID) | ORAL | Status: DC

## 2015-02-17 MED ORDER — GLIPIZIDE 5 MG PO TABS: ORAL_TABLET | ORAL | Status: DC

## 2015-02-17 MED ORDER — ALPRAZOLAM 1 MG PO TABS: 1.0000 mg | ORAL_TABLET | Freq: Two times a day (BID) | ORAL | Status: DC | PRN

## 2015-02-17 MED ORDER — INSULIN DETEMIR 100 UNIT/ML ~~LOC~~ SOLN: 40.0000 [IU] | Freq: Every day | SUBCUTANEOUS | Status: DC

## 2015-02-17 MED ORDER — METFORMIN HCL 500 MG PO TABS: 500.0000 mg | ORAL_TABLET | Freq: Two times a day (BID) | ORAL | Status: DC

## 2015-02-17 MED ORDER — TRIAMCINOLONE ACETONIDE 0.1 % EX CREA: 1.0000 "application " | TOPICAL_CREAM | Freq: Every day | CUTANEOUS | Status: DC

## 2015-02-17 MED ORDER — ROSUVASTATIN CALCIUM 10 MG PO TABS: 10.0000 mg | ORAL_TABLET | Freq: Every day | ORAL | Status: DC

## 2015-02-17 NOTE — Progress Notes (Signed)
Pre visit review using our clinic review tool, if applicable. No additional management support is needed unless otherwise documented below in the visit note.  New patient.  Going to return to cards soon for routine appointment.  H/o CAD noted.  Compliant with meds.    Diabetes:  Using medications without difficulties:yes Hypoglycemic episodes:rarely at night, down to 60-70s.  Hyperglycemic episodes:no Feet problems: some tingling in the feet, doing on for several months.   Blood Sugars averaging: ~150-180 eye exam within last year: Usually taking about 40 units of insulin at night, occ with another AM dose of up to 20units.  We talked about gradual changes in dosing.    Anxiety.  After going on disability.  On xanax w/o ADE.  "it helps."   Needs refills on meds.    PMH and SH reviewed  Meds, vitals, and allergies reviewed.   ROS: See HPI.  Otherwise negative.    GEN: nad, alert and oriented HEENT: mucous membranes moist NECK: supple w/o LA CV: rrr. PULM: ctab, no inc wob ABD: soft, +bs EXT: no edema SKIN: no acute rash but chronic rash on the R calf noted (uses TAC prn with some relief)  Diabetic foot exam: Normal inspection except for irritated area where a block had fallen on his R2nd toe w/o ulceration.  Doesn't look infected.   No skin breakdown No calluses  Normal DP pulses Normal sensation to light touch and monofilament Nails normal

## 2015-02-17 NOTE — Patient Instructions (Addendum)
Go to the lab on the way out.  We'll contact you with your lab report. Drop off your old records from Dr. Hardin Negus when you can.   Take care.  Glad to see you.  Plan on coming back in about 3 months, labs ahead of the visit if possible.

## 2015-02-19 DIAGNOSIS — F411 Generalized anxiety disorder: Secondary | ICD-10-CM | POA: Insufficient documentation

## 2015-02-19 NOTE — Assessment & Plan Note (Signed)
Encouraged cards f/u and we'll work on risk factor reduction.

## 2015-02-19 NOTE — Assessment & Plan Note (Signed)
D/w pt about diet and exercise, see notes on labs.  No change in meds at this point, other than adjusting his insulin by 1 unit per day.  >30 minutes spent in face to face time with patient, >50% spent in counselling or coordination of care.

## 2015-02-19 NOTE — Assessment & Plan Note (Signed)
See notes on labs. 

## 2015-02-19 NOTE — Assessment & Plan Note (Signed)
Continue current meds.  No ADE.

## 2015-03-05 ENCOUNTER — Ambulatory Visit (INDEPENDENT_AMBULATORY_CARE_PROVIDER_SITE_OTHER): Payer: Medicare Other | Admitting: Family Medicine

## 2015-03-05 ENCOUNTER — Encounter: Payer: Self-pay | Admitting: Family Medicine

## 2015-03-05 VITALS — BP 122/68 | HR 79 | Temp 97.9°F | Wt 223.5 lb

## 2015-03-05 DIAGNOSIS — E118 Type 2 diabetes mellitus with unspecified complications: Secondary | ICD-10-CM | POA: Diagnosis not present

## 2015-03-05 MED ORDER — LISINOPRIL 2.5 MG PO TABS: 2.5000 mg | ORAL_TABLET | Freq: Every day | ORAL | Status: DC

## 2015-03-05 MED ORDER — METFORMIN HCL 500 MG PO TABS: 500.0000 mg | ORAL_TABLET | Freq: Two times a day (BID) | ORAL | Status: DC

## 2015-03-05 MED ORDER — INSULIN DETEMIR 100 UNIT/ML ~~LOC~~ SOLN: 40.0000 [IU] | Freq: Every day | SUBCUTANEOUS | Status: DC

## 2015-03-05 NOTE — Progress Notes (Signed)
Pre visit review using our clinic review tool, if applicable. No additional management support is needed unless otherwise documented below in the visit note.  DM2 f/u.  Sugars usually elevated after meals, >200, occ >300.  Has occ upped his insulin to dec his AM sugars in the meantime.  Needs new rx for TID test strips, done for patient at [place] and given to patient.  MALB up, see plan.  All recent labs d/w pt.   Meds, vitals, and allergies reviewed.   ROS: See HPI.  Otherwise, noncontributory.  GEN: nad, alert and oriented HEENT: mucous membranes moist NECK: supple w/o LA CV: rrr.   PULM: ctab, no inc wob ABD: soft, +bs EXT: no edema

## 2015-03-05 NOTE — Patient Instructions (Signed)
Increase your metformin.  Take 2 in the Am and then 1 in the PM for about 1 week.  If tolerated, then go up to 2 in the AM and 2 in the PM.  Max 4 metformin pills a day.    When you get to your maximum dose of metformin, then start increasing your insulin.  If you sugar is above 150 in the AM, then add 1 unit.  Keep adding a unit until your sugar is below 150.   If below 100, then cut back by 1 unit per day.    Ex 40--->41--->42--->43--->44--->45--->etc.   Then add on 1 pill of lisinopril a day to protect your kidneys.  Update me in about 10-14 days, sooner if needed.  Take care.  Glad to see you.

## 2015-03-06 NOTE — Assessment & Plan Note (Signed)
>  25 minutes spent in face to face time with patient, >50% spent in counselling or coordination of care.  3 steps for DM mgmt at this point, dw pt in detail.   1. Increase metformin. Take 2 in the Am and then 1 in the PM for about 1 week. If tolerated, then go up to 2 in the AM and 2 in the PM.  Max 4 metformin pills a day.   2. When at maximum dose of metformin, then start increasing insulin.  If AM sugar is above 150, then add 1 unit.  Keep adding a unit daily until AM sugar is below 150.  If AM below 100, then cut back by 1 unit per day.  Ex 40--->41--->42--->43--->44--->45--->etc.   3. Then add on 1 pill of lisinopril a day re: MALB pos.   He'll update me in about 10-14 days, sooner if needed.   He agrees with plan.

## 2015-03-11 ENCOUNTER — Ambulatory Visit: Payer: Self-pay

## 2015-03-16 ENCOUNTER — Ambulatory Visit (INDEPENDENT_AMBULATORY_CARE_PROVIDER_SITE_OTHER): Payer: Medicare Other

## 2015-03-16 VITALS — BP 132/82 | HR 72 | Temp 97.9°F | Ht 71.0 in | Wt 224.5 lb

## 2015-03-16 DIAGNOSIS — Z0000 Encounter for general adult medical examination without abnormal findings: Secondary | ICD-10-CM

## 2015-03-16 NOTE — Progress Notes (Signed)
Subjective:   Craig Koch is a 60 y.o. male who presents for an Initial Medicare Annual Wellness Visit.   Cardiac Risk Factors include: advanced age (>33mn, >>5women);diabetes mellitus;dyslipidemia;hypertension;male gender;smoking/ tobacco exposure;obesity (BMI >30kg/m2)    Objective:    Today's Vitals    03/16/15 1217  BP:  132/82  Pulse:  72  Temp:  97.9 F (36.6 C)  TempSrc:  Oral  Height:  '5\' 11"'$  (1.803 m)  Weight:  224 lb 8 oz (101.833 kg)  SpO2:  96%  PainSc:  4     Current Medications (verified) Outpatient Encounter Prescriptions as of 03/16/2015  Medication Sig  . ALPRAZolam (XANAX) 1 MG tablet Take 1 tablet (1 mg total) by mouth 2 (two) times daily as needed.  .Marland Kitchenaspirin 325 MG tablet Take 325 mg by mouth daily.  .Marland KitchenglipiZIDE (GLUCOTROL) 5 MG tablet Take 2 tablets by mouth every morning and 1 tablet by mouth every evening.  .Marland Kitchenglucose blood (ONE TOUCH ULTRA TEST) test strip Use as instructed to test blood sugar twice daily and as needed.  Dx: E11.9  Insulin dependent  . insulin detemir (LEVEMIR) 100 UNIT/ML injection Inject 0.4 mLs (40 Units total) into the skin at bedtime. Add 1 unit per day if your AM sugar is above 150.  .Marland Kitchenlisinopril (ZESTRIL) 2.5 MG tablet Take 1 tablet (2.5 mg total) by mouth daily.  . metFORMIN (GLUCOPHAGE) 500 MG tablet Take 1-2 tablets (500-1,000 mg total) by mouth 2 (two) times daily with a meal.  . metoprolol (LOPRESSOR) 100 MG tablet Take 0.5 tablets (50 mg total) by mouth 2 (two) times daily.  . rosuvastatin (CRESTOR) 10 MG tablet Take 1 tablet (10 mg total) by mouth daily.  .Marland Kitchentriamcinolone cream (KENALOG) 0.1 % Apply 1 application topically daily.   No facility-administered encounter medications on file as of 03/16/2015.    Allergies (verified) Review of patient's allergies indicates no known allergies.   History: Past Medical History  Diagnosis Date  . Coronary artery disease   . Hypertension   . Diabetes mellitus   .  Dyslipidemia   . Anxiety    Past Surgical History  Procedure Laterality Date  . Coronary artery bypass graft      (CABG with LIMA to the  LAD, SVG to OM2/OM3, SVG  to diag  . Right lower leg surgery      rod  . Left ankle surgery      repair of fracture   Family History  Problem Relation Age of Onset  . Colon cancer Neg Hx   . Prostate cancer Neg Hx   . Heart disease Father    Social History   Occupational History  . Not on file.   Social History Main Topics  . Smoking status: Current Some Day Smoker    Types: Cigars    Last Attempt to Quit: 07/06/2013  . Smokeless tobacco: Current User    Types: Snuff     Comment: Chews on cigars and dips.   . Alcohol Use: 0.0 oz/week    0 Standard drinks or equivalent per week     Comment: rare  . Drug Use: No  . Sexual Activity: No   Tobacco Counseling Ready to quit: No Counseling given: No   Activities of Daily Living In your present state of health, do you have any difficulty performing the following activities: 03/16/2015  Hearing? N  Vision? Y  Difficulty concentrating or making decisions? N  Walking or climbing stairs?  Y  Dressing or bathing? N  Doing errands, shopping? N  Preparing Food and eating ? N  Using the Toilet? N  In the past six months, have you accidently leaked urine? N  Do you have problems with loss of bowel control? N  Managing your Medications? N  Managing your Finances? N    Patient Care Team: Tonia Ghent, MD as PCP - General (Family Medicine)  Thelma Comp, OD - Consulting Physician     Assessment:   This is a routine wellness examination for Craig Koch.   Hearing/Vision screen  Hearing Screening   '125Hz'$  '250Hz'$  '500Hz'$  '1000Hz'$  '2000Hz'$  '4000Hz'$  '8000Hz'$   Right ear:   40 40 40 40   Left ear:   40 40 40 40    Vision Screening - Last eye exam 09/2013  Dietary issues and exercise activities discussed: Current Exercise Habits: Home exercise routine, Type of exercise: Other - see comments  (uses exercise pedals), Time (Minutes): 15, Frequency (Times/Week): 5, Weekly Exercise (Minutes/Week): 75, Intensity: Mild, Exercise limited by: None identified  Goals    . Reduce fat intake to X grams per day     Starting 03/16/2015, I will continue to decrease carb and fat intake in an effort to decrease A1C.       Depression Screen PHQ 2/9 Scores 03/16/2015  PHQ - 2 Score 0    Fall Risk Fall Risk  03/16/2015  Falls in the past year? Yes  Number falls in past yr: 1  Injury with Fall? No  Risk for fall due to : Impaired balance/gait  Follow up Falls evaluation completed    Cognitive Function: MMSE - Mini Mental State Exam 03/16/2015  Orientation to time 5  Orientation to Place 5  Registration 3  Attention/ Calculation 5  Recall 3  Language- name 2 objects 0  Language- repeat 1  Language- follow 3 step command 3  Language- read & follow direction 1  Write a sentence 0  Copy design 0  Total score 26    Screening Tests Health Maintenance  Topic Date Due  . OPHTHALMOLOGY EXAM  06/04/2015 (Originally 03/16/1965)  . Hepatitis C Screening  06/04/2015 (Originally [birth year])  . HIV Screening  06/04/2015 (Originally 03/17/1970)  . INFLUENZA VACCINE  04/02/2016 (Originally 08/04/2014)  . PNEUMOCOCCAL POLYSACCHARIDE VACCINE (1) 03/14/2020 (Originally 03/16/1957)  . COLONOSCOPY  03/15/2025 (Originally 03/16/2005)  . COLON CANCER SCREENING ANNUAL FOBT  04/04/2015  . HEMOGLOBIN A1C  08/17/2015  . FOOT EXAM  02/17/2016  . TETANUS/TDAP  03/03/2020        Plan:     I have personally reviewed the Medicare Annual Wellness questionnaire and have noted the following in the patient's chart:  A. Medical and social history B. Use of alcohol, tobacco or illicit drugs  C. Current medications and supplements D. Functional ability and status E.  Nutritional status F.  Physical activity G. Advance directives H. List of other physicians I.  Hospitalizations, surgeries, and ER visits in  previous 12 months J.  [place] to include hearing, vision, cognitive, depression L. Referrals and appointments - none  In addition, I reviewed preventive protocols, quality metrics, and best practice recommendations specific to patient. A written personalized care plan for preventive services as well as general preventive health recommendations were provided to patient.  See attached scanned questionnaire for additional information.   Signed,   Lindell Noe, MHA, BS, LPN Health Advisor [id number]

## 2015-03-16 NOTE — Patient Instructions (Signed)
Mr. Melucci , Thank you for taking time to come for your Medicare Wellness Visit. I appreciate your ongoing commitment to your health goals. Please review the following plan we discussed and let me know if I can assist you in the future.   These are the goals we discussed: Goals    . Reduce fat intake to X grams per day     Starting 03/16/2015, I will continue to decrease carb and fat intake in an effort to decrease A1C.        This is a list of the screening recommended for you and due dates:  Health Maintenance  Topic Date Due  .  Hepatitis C: One time screening is recommended by Center for Disease Control  (CDC) for  adults born from [birth year] through 1965.   Postpone  . Pneumococcal vaccine (1) Declined  . Eye exam for diabetics  Will schedule  . HIV Screening  Postpone  . Tetanus Vaccine  5 yrs ago  . Colon Cancer Screening  Fecal occult blood test annually  . Flu Shot  Declined  . Hemoglobin A1C  08/17/2015  . Complete foot exam   02/17/2016   Preventive Care for Adults  A healthy lifestyle and preventive care can promote health and wellness. Preventive health guidelines for adults include the following key practices.  . A routine yearly physical is a good way to check with your health care provider about your health and preventive screening. It is a chance to share any concerns and updates on your health and to receive a thorough exam.  . Visit your dentist for a routine exam and preventive care every 6 months. Brush your teeth twice a day and floss once a day. Good oral hygiene prevents tooth decay and gum disease.  . The frequency of eye exams is based on your age, health, family medical history, use  of contact lenses, and other factors. Follow your health care provider's ecommendations for frequency of eye exams.  . Eat a healthy diet. Foods like vegetables, fruits, whole grains, low-fat dairy products, and lean protein foods contain the nutrients you need without too many  calories. Decrease your intake of foods high in solid fats, added sugars, and salt. Eat the right amount of calories for you. Get information about a proper diet from your health care provider, if necessary.  . Regular physical exercise is one of the most important things you can do for your health. Most adults should get at least 150 minutes of moderate-intensity exercise (any activity that increases your heart rate and causes you to sweat) each week. In addition, most adults need muscle-strengthening exercises on 2 or more days a week.  Silver Sneakers may be a benefit available to you. To determine eligibility, you may visit the website: www.silversneakers.com or contact program at (586)828-8203 Mon-Fri between 8AM-8PM.   . Maintain a healthy weight. The body mass index (BMI) is a screening tool to identify possible weight problems. It provides an estimate of body fat based on height and weight. Your health care provider can find your BMI and can help you achieve or maintain a healthy weight.   For adults 20 years and older: ? A BMI below 18.5 is considered underweight. ? A BMI of 18.5 to 24.9 is normal. ? A BMI of 25 to 29.9 is considered overweight. ? A BMI of 30 and above is considered obese.   . Maintain normal blood lipids and cholesterol levels by exercising and minimizing your intake of  saturated fat. Eat a balanced diet with plenty of fruit and vegetables. Blood tests for lipids and cholesterol should begin at age 19 and be repeated every 5 years. If your lipid or cholesterol levels are high, you are over 50, or you are at high risk for heart disease, you may need your cholesterol levels checked more frequently. Ongoing high lipid and cholesterol levels should be treated with medicines if diet and exercise are not working.  . If you smoke, find out from your health care provider how to quit. If you do not use tobacco, please do not start.  . If you choose to drink alcohol, please do  not consume more than 2 drinks per day. One drink is considered to be 12 ounces (355 mL) of beer, 5 ounces (148 mL) of wine, or 1.5 ounces (44 mL) of liquor.  . If you are 78-75 years old, ask your health care provider if you should take aspirin to prevent strokes.  . Use sunscreen. Apply sunscreen liberally and repeatedly throughout the day. You should seek shade when your shadow is shorter than you. Protect yourself by wearing long sleeves, pants, a wide-brimmed hat, and sunglasses year round, whenever you are outdoors.  . Once a month, do a whole body skin exam, using a mirror to look at the skin on your back. Tell your health care provider of new moles, moles that have irregular borders, moles that are larger than a pencil eraser, or moles that have changed in shape or color.

## 2015-03-16 NOTE — Progress Notes (Signed)
Pre visit review using our clinic review tool, if applicable. No additional management support is needed unless otherwise documented below in the visit note. I reviewed health advisor's note, was available for consultation, and agree with documentation and plan. Elsie Stain, MD.  03/16/15.

## 2015-03-19 ENCOUNTER — Other Ambulatory Visit: Payer: Self-pay | Admitting: Family Medicine

## 2015-03-19 DIAGNOSIS — Z119 Encounter for screening for infectious and parasitic diseases, unspecified: Secondary | ICD-10-CM

## 2015-05-18 ENCOUNTER — Encounter: Payer: Self-pay | Admitting: Family Medicine

## 2015-05-18 ENCOUNTER — Ambulatory Visit (INDEPENDENT_AMBULATORY_CARE_PROVIDER_SITE_OTHER): Payer: Medicare Other | Admitting: Family Medicine

## 2015-05-18 ENCOUNTER — Other Ambulatory Visit: Payer: Self-pay | Admitting: Family Medicine

## 2015-05-18 VITALS — BP 122/74 | HR 77 | Temp 98.0°F | Wt 217.8 lb

## 2015-05-18 DIAGNOSIS — L989 Disorder of the skin and subcutaneous tissue, unspecified: Secondary | ICD-10-CM | POA: Diagnosis not present

## 2015-05-18 DIAGNOSIS — E118 Type 2 diabetes mellitus with unspecified complications: Secondary | ICD-10-CM

## 2015-05-18 DIAGNOSIS — Z119 Encounter for screening for infectious and parasitic diseases, unspecified: Secondary | ICD-10-CM

## 2015-05-18 DIAGNOSIS — E119 Type 2 diabetes mellitus without complications: Secondary | ICD-10-CM

## 2015-05-18 LAB — HEMOGLOBIN A1C: Hgb A1c MFr Bld: 9.3 % — ABNORMAL HIGH (ref 4.6–6.5)

## 2015-05-18 LAB — HIV ANTIBODY (ROUTINE TESTING W REFLEX): HIV 1&2 Ab, 4th Generation: NONREACTIVE

## 2015-05-18 MED ORDER — INSULIN DETEMIR 100 UNIT/ML ~~LOC~~ SOLN: 50.0000 [IU] | Freq: Every day | SUBCUTANEOUS | Status: DC

## 2015-05-18 NOTE — Progress Notes (Signed)
Pre visit review using our clinic review tool, if applicable. No additional management support is needed unless otherwise documented below in the visit note.  Diabetes:  Using medications without difficulties: yes Hypoglycemic episodes: rarely low episodes, in the AM if it is going to happen, down to ~80 with is a relative low for him, before breakfast.   Hyperglycemic episodes: occ, up to 240s Feet problems: no Blood Sugars averaging: usually ~140 eye exam within last year: f/u pending.  D/w pt.  A1c pending.   Meds, vitals, and allergies reviewed.   ROS: Per HPI unless specifically indicated in ROS section   GEN: nad, alert and oriented HEENT: mucous membranes moist NECK: supple w/o LA CV: rrr. PULM: ctab, no inc wob ABD: soft, +bs EXT: no edema SKIN: no acute rash but 24m irritated lesion w/o ulceration on the R side of the face noted (d/w pt re: irritated SK vs AK vs possible SCC- given size/location, most likely best tx would be liquid N2- d/w pt, offered, encouraged, he'll consider)

## 2015-05-18 NOTE — Patient Instructions (Signed)
Go to the lab on the way out.  We'll contact you with your lab report. Take care.  Glad to see you.  

## 2015-05-19 DIAGNOSIS — S51819A Laceration without foreign body of unspecified forearm, initial encounter: Secondary | ICD-10-CM | POA: Insufficient documentation

## 2015-05-19 DIAGNOSIS — L989 Disorder of the skin and subcutaneous tissue, unspecified: Secondary | ICD-10-CM | POA: Insufficient documentation

## 2015-05-19 LAB — HEPATITIS C ANTIBODY: HCV Ab: NEGATIVE

## 2015-05-19 NOTE — Assessment & Plan Note (Signed)
See notes on labs. 

## 2015-05-19 NOTE — Assessment & Plan Note (Signed)
80m irritated lesion w/o ulceration on the R side of the face noted (d/w pt re: irritated SK vs AK vs possible SCC- given size/location, most likely best tx would be liquid N2- d/w pt, offered, encouraged, he'll consider).

## 2015-05-25 ENCOUNTER — Telehealth: Payer: Self-pay

## 2015-05-25 ENCOUNTER — Other Ambulatory Visit: Payer: Self-pay | Admitting: Family Medicine

## 2015-05-25 DIAGNOSIS — E118 Type 2 diabetes mellitus with unspecified complications: Secondary | ICD-10-CM

## 2015-05-25 NOTE — Telephone Encounter (Signed)
Gabor dropped off his blood sugar readings. Placed in RX box up front.

## 2015-05-25 NOTE — Telephone Encounter (Signed)
Placed in Dr. Duncan's In Box. 

## 2015-05-26 NOTE — Telephone Encounter (Signed)
I looked back at this.  He had variable insulin dosing with the record (30-55 units per day) I need to see what his sugar is doing with a steady insulin dose.   If his AM sugar is still >150, keep adding 1 unit until his AM sugar is <150 and then continue that dose.  When he hasn't had any lows and his AM sugar is <150, then record and drop of a set of numbers, pre/2 hours post meal.   I printed a grid for patient to fill out.  Thanks.

## 2015-05-26 NOTE — Telephone Encounter (Signed)
Letter mailed to patient along with the printed grid that Dr. Damita Dunnings printed out.

## 2015-05-26 NOTE — Telephone Encounter (Signed)
Please talk to me about this- I have questions about the way the patient was tracking his sugars.  Thanks.

## 2015-06-04 DIAGNOSIS — Z794 Long term (current) use of insulin: Secondary | ICD-10-CM | POA: Diagnosis not present

## 2015-06-04 DIAGNOSIS — Z7984 Long term (current) use of oral hypoglycemic drugs: Secondary | ICD-10-CM | POA: Diagnosis not present

## 2015-06-04 DIAGNOSIS — H524 Presbyopia: Secondary | ICD-10-CM | POA: Diagnosis not present

## 2015-06-04 DIAGNOSIS — H25813 Combined forms of age-related cataract, bilateral: Secondary | ICD-10-CM | POA: Diagnosis not present

## 2015-06-04 DIAGNOSIS — E119 Type 2 diabetes mellitus without complications: Secondary | ICD-10-CM | POA: Diagnosis not present

## 2015-06-04 LAB — HM DIABETES EYE EXAM

## 2015-06-26 ENCOUNTER — Encounter: Payer: Self-pay | Admitting: Family Medicine

## 2015-06-29 ENCOUNTER — Other Ambulatory Visit: Payer: Self-pay | Admitting: Family Medicine

## 2015-06-29 NOTE — Telephone Encounter (Signed)
Received refill electronically Last refill 02/17/15 #60/2 Last office visit 05/18/15

## 2015-06-29 NOTE — Telephone Encounter (Signed)
Please call in.  Thanks.   

## 2015-06-30 NOTE — Telephone Encounter (Signed)
Medication phoned to pharmacy.  

## 2015-07-13 ENCOUNTER — Telehealth: Payer: Self-pay

## 2015-07-13 NOTE — Telephone Encounter (Signed)
I'll check the hard copy. Thanks.

## 2015-07-13 NOTE — Telephone Encounter (Signed)
Craig Koch [phone]  [name] dropped off his Blood Sugar readings, placed on cart.

## 2015-07-15 ENCOUNTER — Telehealth: Payer: Self-pay | Admitting: Family Medicine

## 2015-07-15 NOTE — Telephone Encounter (Signed)
Please call pt.  Thanks for the update about his sugars.   AM sugars are generally pretty good, with some elevation 2 hours afterward.  His lunch and supper readings (pre/post meal) tend to be higher.  I see options:  Cutting back on carbs at lunch and supper and possibly adding on meal time insulin for lunch and supper.  His last A1c was some better (though not at goal) and if he wants to wait on the next A1c before making a decision about meal time insulin (ie extra shots per day), and take more time to work on diet, then I'm okay with that.  Let me know. Either way, would still need repeat A1c in August as scheduled.  Thanks.

## 2015-07-15 NOTE — Telephone Encounter (Signed)
Patient notified as instructed by telephone and verbalized understanding. Patient stated that he will work harder on his diet and wants to hold off on the any other changes at this time. Patient stated that he will discuss this further with Dr. Damita Dunnings at his upcoming appointment.

## 2015-07-16 NOTE — Telephone Encounter (Signed)
Noted. Thanks.

## 2015-07-22 ENCOUNTER — Other Ambulatory Visit: Payer: Self-pay | Admitting: Family Medicine

## 2015-07-22 NOTE — Telephone Encounter (Signed)
Sent. Thanks.   

## 2015-07-22 NOTE — Telephone Encounter (Signed)
Received refill request electronically Medication is not on medication list Okay to refill as requested?

## 2015-08-04 ENCOUNTER — Ambulatory Visit: Payer: Self-pay | Admitting: Family Medicine

## 2015-08-18 ENCOUNTER — Other Ambulatory Visit (INDEPENDENT_AMBULATORY_CARE_PROVIDER_SITE_OTHER): Payer: Medicare Other

## 2015-08-18 DIAGNOSIS — E118 Type 2 diabetes mellitus with unspecified complications: Secondary | ICD-10-CM

## 2015-08-18 LAB — LIPID PANEL
Cholesterol: 159 mg/dL (ref 0–200)
HDL: 42.7 mg/dL (ref >39.00)
NonHDL: 116.32
Total CHOL/HDL Ratio: 4
Triglycerides: 203 mg/dL — ABNORMAL HIGH (ref 0.0–149.0)
VLDL: 40.6 mg/dL — ABNORMAL HIGH (ref 0.0–40.0)

## 2015-08-18 LAB — HEMOGLOBIN A1C: Hgb A1c MFr Bld: 9.3 % — ABNORMAL HIGH (ref 4.6–6.5)

## 2015-08-18 LAB — LDL CHOLESTEROL, DIRECT: Direct LDL: 84 mg/dL

## 2015-08-24 DIAGNOSIS — H2512 Age-related nuclear cataract, left eye: Secondary | ICD-10-CM | POA: Diagnosis not present

## 2015-08-25 ENCOUNTER — Ambulatory Visit: Payer: Self-pay | Admitting: Family Medicine

## 2015-08-26 ENCOUNTER — Encounter: Payer: Self-pay | Admitting: Family Medicine

## 2015-08-26 ENCOUNTER — Ambulatory Visit (INDEPENDENT_AMBULATORY_CARE_PROVIDER_SITE_OTHER): Payer: Medicare Other | Admitting: Family Medicine

## 2015-08-26 DIAGNOSIS — E118 Type 2 diabetes mellitus with unspecified complications: Secondary | ICD-10-CM | POA: Diagnosis not present

## 2015-08-26 MED ORDER — ROSUVASTATIN CALCIUM 10 MG PO TABS: 10.0000 mg | ORAL_TABLET | Freq: Every day | ORAL | 12 refills | Status: DC

## 2015-08-26 MED ORDER — INSULIN LISPRO 100 UNIT/ML ~~LOC~~ SOLN: 5.0000 [IU] | Freq: Three times a day (TID) | SUBCUTANEOUS | 11 refills | Status: DC

## 2015-08-26 MED ORDER — INSULIN DETEMIR 100 UNIT/ML ~~LOC~~ SOLN: 45.0000 [IU] | Freq: Every day | SUBCUTANEOUS | Status: DC

## 2015-08-26 NOTE — Patient Instructions (Signed)
Cut the levemir back to 45 units at night.  Add on humalog 5 units with meals.  Update me in about 1 week.  We'll go from there. If you have a low sugar, then cut the levemir back to 40 units and skip the next dose of humalog.  Recheck A1c in about 3 months.   Take care.  Glad to see you.

## 2015-08-26 NOTE — Progress Notes (Signed)
Pre visit review using our clinic review tool, if applicable. No additional management support is needed unless otherwise documented below in the visit note. 

## 2015-08-26 NOTE — Progress Notes (Signed)
Diabetes:  Using medications without difficulties: yes, taking metformin 1 tab BID.  Hypoglycemic episodes:no Hyperglycemic episodes: see below Feet problems:some tingling but no skin breakdown.   Blood Sugars averaging: ~150 or higher often.  Has been down into the 80s in the AMs, but then can go up >200 after a small breakfast.    eye exam within last year: yes A1c stable at 9.3.    Meds, vitals, and allergies reviewed.   ROS: Per HPI unless specifically indicated in ROS section   GEN: nad, alert and oriented HEENT: mucous membranes moist NECK: supple w/o LA CV: rrr. PULM: ctab, no inc wob ABD: soft, +bs EXT: no edema SKIN: no acute rash

## 2015-08-27 NOTE — Assessment & Plan Note (Signed)
Patient does have reasonable control of sugar in the mornings before he eats but he is having significant elevations after meals. Discussed patient at this point is probably going to need mealtime insulin. This is likely the best option. We will decrease his long-acting insulin 45 units at night and add on 5 units of short-acting insulin with meals. Routine cautions given. See after visit summary. He'll update me in a few days. Recheck A1c in about 3 months.

## 2015-09-04 HISTORY — PX: CATARACT EXTRACTION: SUR2

## 2015-09-29 DIAGNOSIS — H2512 Age-related nuclear cataract, left eye: Secondary | ICD-10-CM | POA: Diagnosis not present

## 2015-09-29 DIAGNOSIS — Z961 Presence of intraocular lens: Secondary | ICD-10-CM | POA: Diagnosis not present

## 2015-11-09 ENCOUNTER — Other Ambulatory Visit: Payer: Self-pay | Admitting: Family Medicine

## 2015-11-10 NOTE — Telephone Encounter (Signed)
Electronic refill request. Last Filled:    60 tablet 2 06/29/2015  Last office visit:   08/26/15  Please advise.

## 2015-11-11 NOTE — Telephone Encounter (Signed)
Rx called to pharmacy as instructed. 

## 2015-11-11 NOTE — Telephone Encounter (Signed)
Please call in.  Thanks.   

## 2015-11-16 ENCOUNTER — Ambulatory Visit (INDEPENDENT_AMBULATORY_CARE_PROVIDER_SITE_OTHER): Payer: Medicare Other | Admitting: Family Medicine

## 2015-11-16 ENCOUNTER — Encounter: Payer: Self-pay | Admitting: Family Medicine

## 2015-11-16 VITALS — BP 120/82 | HR 55 | Temp 97.9°F | Ht 71.5 in | Wt 224.0 lb

## 2015-11-16 DIAGNOSIS — E119 Type 2 diabetes mellitus without complications: Secondary | ICD-10-CM

## 2015-11-16 DIAGNOSIS — Z87898 Personal history of other specified conditions: Secondary | ICD-10-CM | POA: Diagnosis not present

## 2015-11-16 DIAGNOSIS — E118 Type 2 diabetes mellitus with unspecified complications: Secondary | ICD-10-CM | POA: Diagnosis not present

## 2015-11-16 DIAGNOSIS — F411 Generalized anxiety disorder: Secondary | ICD-10-CM

## 2015-11-16 DIAGNOSIS — I10 Essential (primary) hypertension: Secondary | ICD-10-CM

## 2015-11-16 DIAGNOSIS — E785 Hyperlipidemia, unspecified: Secondary | ICD-10-CM

## 2015-11-16 LAB — HEMOGLOBIN A1C: Hgb A1c MFr Bld: 8.8 % — ABNORMAL HIGH (ref 4.6–6.5)

## 2015-11-16 MED ORDER — INSULIN DETEMIR 100 UNIT/ML ~~LOC~~ SOLN: 60.0000 [IU] | Freq: Every day | SUBCUTANEOUS | Status: DC

## 2015-11-16 MED ORDER — METFORMIN HCL 500 MG PO TABS: 500.0000 mg | ORAL_TABLET | Freq: Two times a day (BID) | ORAL | Status: DC

## 2015-11-16 NOTE — Progress Notes (Signed)
Diabetes:  Using medications without difficulties:yes Hypoglycemic episodes: rare, only if prolonged fasting, he can feel sx and corrects with a snack.   Hyperglycemic episodes:no Feet problems: no Blood Sugars averaging: ~150 eye exam within last year: yes He has been working on diet.  Inc in daily insulin, not on mealtime insulin.  A1c pending.    Anxiety controlled.  No ADE on med.  No sedation from medicine.  No contraindication to driving.  No SI/HI.   Hypertension:    Using medication without problems or lightheadedness: yes Chest pain with exertion:no Edema:no Short of breath:no  Elevated Cholesterol: Using medications without problems:yes Muscle aches: no Diet compliance: yes Exercise: some, encouraged more.  yardwork mainly.    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  D/w pt.  See scanned forms.  No etoh use.   Smoking about <1/2 PPD.  D/w pt tapering/cessation.   Vital signs, Meds and allergies reviewed.  ROS: Per HPI unless specifically indicated in ROS section   GEN: nad, alert and oriented HEENT: mucous membranes moist NECK: supple w/o LA CV: rrr.  PULM: ctab, no inc wob ABD: soft, +bs EXT: no edema Affect wnl, speech wnl.

## 2015-11-16 NOTE — Patient Instructions (Signed)
Go to the lab on the way out.  We'll contact you with your lab report. Take care.  Glad to see you.  

## 2015-11-17 ENCOUNTER — Encounter: Payer: Self-pay | Admitting: Family Medicine

## 2015-11-17 DIAGNOSIS — Z87898 Personal history of other specified conditions: Secondary | ICD-10-CM | POA: Insufficient documentation

## 2015-11-17 NOTE — Assessment & Plan Note (Signed)
Continue as is.

## 2015-11-17 NOTE — Assessment & Plan Note (Signed)
H/o DUI 1994, 1998.  No use now.  All d/w pt.  See scanned forms.  No contraindication to driving now.

## 2015-11-17 NOTE — Assessment & Plan Note (Addendum)
Continue statin.   See above.  >25 minutes spent in face to face time with patient, >50% spent in counselling or coordination of care.

## 2015-11-17 NOTE — Assessment & Plan Note (Addendum)
See notes on labs.  See scanned forms.  No contraindication to driving now.  He'll have f/u with eye clinic about the ophthalmology portion of the [facility] forms.

## 2015-11-17 NOTE — Assessment & Plan Note (Signed)
Continue current meds.  See scanned forms.  No contraindication to driving now.

## 2015-11-18 ENCOUNTER — Other Ambulatory Visit: Payer: Self-pay | Admitting: Family Medicine

## 2015-11-18 MED ORDER — INSULIN LISPRO 100 UNIT/ML ~~LOC~~ SOLN: 5.0000 [IU] | Freq: Three times a day (TID) | SUBCUTANEOUS | 11 refills | Status: DC

## 2016-02-01 ENCOUNTER — Other Ambulatory Visit: Payer: Self-pay | Admitting: Family Medicine

## 2016-02-01 DIAGNOSIS — E118 Type 2 diabetes mellitus with unspecified complications: Secondary | ICD-10-CM

## 2016-02-12 ENCOUNTER — Encounter (INDEPENDENT_AMBULATORY_CARE_PROVIDER_SITE_OTHER): Payer: Self-pay

## 2016-02-12 ENCOUNTER — Other Ambulatory Visit (INDEPENDENT_AMBULATORY_CARE_PROVIDER_SITE_OTHER): Payer: Medicare Other

## 2016-02-12 DIAGNOSIS — E118 Type 2 diabetes mellitus with unspecified complications: Secondary | ICD-10-CM | POA: Diagnosis not present

## 2016-02-12 LAB — BASIC METABOLIC PANEL
BUN: 17 mg/dL (ref 6–23)
CO2: 32 mEq/L (ref 19–32)
Calcium: 10 mg/dL (ref 8.4–10.5)
Chloride: 100 mEq/L (ref 96–112)
Creatinine, Ser: 1 mg/dL (ref 0.40–1.50)
GFR: 80.76 mL/min (ref >60.00)
Glucose, Bld: 151 mg/dL — ABNORMAL HIGH (ref 70–99)
Potassium: 4.8 mEq/L (ref 3.5–5.1)
Sodium: 138 mEq/L (ref 135–145)

## 2016-02-12 LAB — HEMOGLOBIN A1C: Hgb A1c MFr Bld: 10.1 % — ABNORMAL HIGH (ref 4.6–6.5)

## 2016-02-16 ENCOUNTER — Ambulatory Visit (INDEPENDENT_AMBULATORY_CARE_PROVIDER_SITE_OTHER): Payer: Medicare Other | Admitting: Family Medicine

## 2016-02-16 ENCOUNTER — Encounter: Payer: Self-pay | Admitting: Family Medicine

## 2016-02-16 VITALS — BP 104/64 | HR 62 | Temp 98.4°F | Wt 228.0 lb

## 2016-02-16 DIAGNOSIS — E118 Type 2 diabetes mellitus with unspecified complications: Secondary | ICD-10-CM

## 2016-02-16 DIAGNOSIS — E119 Type 2 diabetes mellitus without complications: Secondary | ICD-10-CM | POA: Diagnosis not present

## 2016-02-16 MED ORDER — INSULIN DETEMIR 100 UNIT/ML ~~LOC~~ SOLN: 50.0000 [IU] | Freq: Every day | SUBCUTANEOUS | Status: DC

## 2016-02-16 NOTE — Patient Instructions (Addendum)
Craig Koch will call about your referral. See if the insurance will cover your diabetes nutrition class.   In the meantime keep taking 50 units of levemir.  If your sugar is >140 prior to meals, then take 5 units of mealtime insulin.  Take care.  Glad to see you.

## 2016-02-16 NOTE — Progress Notes (Signed)
Pre visit review using our clinic review tool, if applicable. No additional management support is needed unless otherwise documented below in the visit note. 

## 2016-02-16 NOTE — Progress Notes (Signed)
DM2.  F/u.  A1c up.  He was to add on mealtime insulin and then update me after last office visit.  I never heard anything back from the patient.  D/w pt.    Has been taking insulin: levemir ~50 units a day.  Taking mealtime insulin ~5 units twice a day.    He has still been eating biscuits and gravy.  Diet d/w pt.  "My trouble is my diet."  He has been occ skipping meals and then "sometimes I can't enough to eat."   He has had sugars in the 70s up to 300s.  Still on metformin.    I offered endo eval.  I encouraged him.  He accepted.   Offered and accepted nutrition eval.    Meds, vitals, and allergies reviewed.   ROS: Per HPI unless specifically indicated in ROS section   GEN: nad, alert and oriented HEENT: mucous membranes moist NECK: supple w/o LA CV: rrr PULM: ctab, no inc wob ABD: soft, +bs EXT: no edema

## 2016-02-17 NOTE — Assessment & Plan Note (Signed)
Refer to Endo. Refer to diabetic teaching. See AVS. All discussed with patient. I Seabron't expect any of this to get any better until he is able to work on his diet, discussed with patient.

## 2016-03-08 ENCOUNTER — Other Ambulatory Visit: Payer: Self-pay | Admitting: Family Medicine

## 2016-03-08 ENCOUNTER — Encounter: Payer: Medicare Other | Attending: Family Medicine | Admitting: Dietician

## 2016-03-08 ENCOUNTER — Encounter: Payer: Self-pay | Admitting: Dietician

## 2016-03-08 VITALS — BP 144/74 | Ht 71.5 in | Wt 234.5 lb

## 2016-03-08 DIAGNOSIS — Z713 Dietary counseling and surveillance: Secondary | ICD-10-CM | POA: Diagnosis not present

## 2016-03-08 DIAGNOSIS — E119 Type 2 diabetes mellitus without complications: Secondary | ICD-10-CM | POA: Diagnosis not present

## 2016-03-08 NOTE — Telephone Encounter (Signed)
Last Rx 11/2015 #60 2R. Last OV 11/2015

## 2016-03-08 NOTE — Progress Notes (Signed)
Diabetes Self-Management Education  Visit Type: First/Initial  Appt. Start Time: 1000 Appt. End Time: 1115  03/08/2016  [name], identified by name and date of birth, is a 61 y.o. male with a diagnosis of Diabetes: Type 2.   ASSESSMENT  Blood pressure (!) 144/74, height 5' 11.5" (1.816 m), weight 234 lb 8 oz (106.4 kg). Body mass index is 32.25 kg/m.      Diabetes Self-Management Education - 03/08/16 1326      Visit Information   Visit Type First/Initial     Initial Visit   Diabetes Type Type 2     Health Coping   How would you rate your overall health? Fair     Psychosocial Assessment   Patient Belief/Attitude about Diabetes Motivated to manage diabetes   Self-care barriers None   Patient Concerns Glycemic Control   Special Needs None   Preferred Learning Style Auditory   Learning Readiness Ready   What is the last grade level you completed in school? 8     Pre-Education Assessment   Patient understands the diabetes disease and treatment process. Needs Review   Patient understands incorporating nutritional management into lifestyle. Needs Review   Patient undertands incorporating physical activity into lifestyle. Needs Review   Patient understands using medications safely. Needs Review   Patient understands monitoring blood glucose, interpreting and using results Needs Review   Patient understands prevention, detection, and treatment of acute complications. Needs Review   Patient understands prevention, detection, and treatment of chronic complications. Needs Review   Patient understands how to develop strategies to address psychosocial issues. Needs Review   Patient understands how to develop strategies to promote health/change behavior. Needs Review     Complications   Last HgB A1C per patient/outside source 10.1 %  3 wk ago   How often do you check your blood sugar? 3-4 times/day   Fasting Blood glucose range (mg/dL) 130-179   Postprandial Blood  glucose range (mg/dL) 130-179;>200;180-200   Number of hypoglycemic episodes per month 0   Have you had a dilated eye exam in the past 12 months? Yes  10-2015   Have you had a dental exam in the past 12 months? No  2016   Are you checking your feet? Yes   How many days per week are you checking your feet? 7     Dietary Intake   Breakfast --  usually eats egg beaters and sausage at 8:30a-9a   Snack (morning) --  eats occasional snack at 10:30-11a=1/2 pack nabs   Lunch --  eats lunch at 1p=eats fried foods 4-5x/wk   Snack (afternoon) --  eats 1/2 pack nabs at 3p   Dinner --  eats supper at 6p   Snack (evening) --  eats occasional snack at 9p=chips or popcorn   Beverage(s) --  drinks water 8+x/day and diet soas 2-3x/day     Exercise   Exercise Type ADL's  exercise bike 15-20 m in 3x/wk   How many days per week to you exercise? 3     Patient Education   Previous Diabetes Education No   Disease state  Definition of diabetes, type 1 and 2, and the diagnosis of diabetes;Explored patient's options for treatment of their diabetes   Nutrition management  Role of diet in the treatment of diabetes and the relationship between the three main macronutrients and blood glucose level;Food label reading, portion sizes and measuring food.;Carbohydrate counting   Physical activity and exercise  Role of exercise on diabetes  management, blood pressure control and cardiac health.;Helped patient identify appropriate exercises in relation to his/her diabetes, diabetes complications and other health issue.   Medications Reviewed patients medication for diabetes, action, purpose, timing of dose and side effects.;Taught/reviewed insulin injection, site rotation, insulin storage and needle disposal.  reviewed use of Levemir and Humalog insulins along with Glipizide and Metformin    Monitoring Purpose and frequency of SMBG.;Identified appropriate SMBG and/or A1C goals.;Taught/evaluated SMBG meter.;Daily foot  exams;Taught/discussed recording of test results and interpretation of SMBG.   Acute complications Taught treatment of hypoglycemia - the 15 rule.;Discussed and identified patients' treatment of hyperglycemia.   Chronic complications Relationship between chronic complications and blood glucose control;Lipid levels, blood glucose control and heart disease;Identified and discussed with patient  current chronic complications;Dental care;Retinopathy and reason for yearly dilated eye exams;Nephropathy, what it is, prevention of, the use of ACE, ARB's and early detection of through urine microalbumia.;Reviewed with patient heart disease, higher risk of, and prevention   Psychosocial adjustment Role of stress on diabetes   Personal strategies to promote health Lifestyle issues that need to be addressed for better diabetes care;Helped patient develop diabetes management plan for (enter comment)     Outcomes   Expected Outcomes Demonstrated interest in learning. Expect positive outcomes      Individualized Plan for Diabetes Self-Management Training:   Learning Objective:  Patient will have a greater understanding of diabetes self-management. Patient education plan is to attend individual and/or group sessions per assessed needs and concerns.   Plan:   Check BG before every meal and some at bedtime and record Ride exercise bike 20 min 4x/wk Avoid sweetened beverages and fruit juices Limit intake of fried foods Eat 3 carbohydrate servings/meal +  Protein Eat a small bedtime snack-include 1 carbohydrate serving/snack + protein Drink lots of water Space meals 4-5 hours apart Make a dentist appointment Take Humalog 15 min. Before meals if able Rotate sites for injections Quit smoking-Consider smoking cessation classes  Carry medical alert ID and candy or glucose tablets at all times Complete a 3 day food record and bring to next appointment Next appointment 03-25-16     Expected Outcomes:   Demonstrated interest in learning. Expect positive outcomes  Education material provided: General mal guidelines, medical alert ID card, 1 tube glucose tablets for PRN use, high and low BG handouts, Living Well With Diabetes booklet, A1C handout, foot care handout, Smoking Cessation class handout  If problems or questions, patient to contact team via:  [phone]  Future DSME appointment:  03-25-16

## 2016-03-08 NOTE — Patient Instructions (Signed)
  Check blood sugars 4 x day before each meal and some before bed every day and record Exercise:  Ride exercise bike  for   20  minutes   4  days a week Avoid sugar sweetened drinks (soda, tea, coffee, sports drinks, juices) Limit intake of fried foods Drink lots of water Eat 3 meals day,   1  snacks a day at bedtime Space meals 4-5 hours apart Eat 3 carbohydrate servings/meal + protein Eat 1 carbohydrate serving/snack + protein Complete 3 Day Food Record and bring to next appt Quit / Decrease smoking Make a dentist appointment Bring blood sugar records to the next appointment/class Carry fast acting glucose/candy  and a snack at all times Carry medical alert ID at all times Take Humalog 15 min. before meals if able Rotate injection sites Return for appointment/classes on: 03-25-16

## 2016-03-09 ENCOUNTER — Other Ambulatory Visit: Payer: Self-pay | Admitting: Family Medicine

## 2016-03-09 MED ORDER — INSULIN DETEMIR 100 UNIT/ML ~~LOC~~ SOLN: 50.0000 [IU] | Freq: Every day | SUBCUTANEOUS | 5 refills | Status: DC

## 2016-03-09 NOTE — Telephone Encounter (Signed)
Refill request for Levemir came in as 40 units, chart shows 50 units. Called and spoke to patient and was advised that this was increased to 50 units at his last office visit and that is what he is taking.  Refill sent in with correct directions. See last office notes.

## 2016-03-09 NOTE — Telephone Encounter (Signed)
Rx called to pharmacy as instructed. 

## 2016-03-09 NOTE — Telephone Encounter (Signed)
Please call in.  Thanks.   

## 2016-03-10 ENCOUNTER — Other Ambulatory Visit: Payer: Self-pay | Admitting: Family Medicine

## 2016-03-24 ENCOUNTER — Encounter: Payer: Self-pay | Admitting: Endocrinology

## 2016-03-24 ENCOUNTER — Ambulatory Visit (INDEPENDENT_AMBULATORY_CARE_PROVIDER_SITE_OTHER): Payer: Medicare Other | Admitting: Endocrinology

## 2016-03-24 VITALS — BP 122/74 | HR 58 | Ht 71.5 in | Wt 228.0 lb

## 2016-03-24 DIAGNOSIS — E118 Type 2 diabetes mellitus with unspecified complications: Secondary | ICD-10-CM

## 2016-03-24 MED ORDER — GLIPIZIDE 5 MG PO TABS: 5.0000 mg | ORAL_TABLET | Freq: Every day | ORAL | 1 refills | Status: DC

## 2016-03-24 MED ORDER — INSULIN LISPRO 100 UNIT/ML ~~LOC~~ SOLN: 10.0000 [IU] | Freq: Three times a day (TID) | SUBCUTANEOUS | 11 refills | Status: DC

## 2016-03-24 MED ORDER — INSULIN DETEMIR 100 UNIT/ML ~~LOC~~ SOLN: 40.0000 [IU] | Freq: Every day | SUBCUTANEOUS | 5 refills | Status: DC

## 2016-03-24 NOTE — Progress Notes (Signed)
Subjective:    Patient ID: Craig Koch, male    DOB: September 01, 1955, 61 y.o.   MRN: [id number]  HPI pt is referred by Dr Damita Dunnings, for diabetes.  Pt states DM was dx'ed in 1999; he has mild neuropathy of the lower extremities; he has associated CAD; he has been on insulin since 2014; pt says his diet is good, but exercise is limited by health problems; he has never had pancreatitis, pancreatic surgery, severe hypoglycemia or DKA.  He takes multiple daily injections.  He brings a record of his cbg's which I have reviewed today.  It varies from 99-317.  It is in general higher as the day goes on.    Past Medical History:  Diagnosis Date  . Anxiety   . Arthritis   . Coronary artery disease   . Diabetes mellitus   . Dyslipidemia   . Hx of CABG   . Hyperlipidemia   . Hypertension     Past Surgical History:  Procedure Laterality Date  . CORONARY ARTERY BYPASS GRAFT     (CABG with LIMA to the  LAD, SVG to OM2/OM3, SVG  to diag  . Left ankle surgery     repair of fracture  . Right lower leg surgery     rod    Social History   Social History  . Marital status: Married    Spouse name: N/A  . Number of children: N/A  . Years of education: N/A   Occupational History  . Not on file.   Social History Main Topics  . Smoking status: Current Every Day Smoker    Packs/day: 0.50    Types: Cigarettes    Last attempt to quit: 07/06/2013  . Smokeless tobacco: Former Systems developer    Types: Snuff     Comment: Chews on cigars and dips.   . Alcohol use 0.0 - 3.6 oz/week  . Drug use: No  . Sexual activity: No   Other Topics Concern  . Not on file   Social History Narrative   On disability 2009 after prev injuries and CAD.     Married 1976   2 kids    Current Outpatient Prescriptions on File Prior to Visit  Medication Sig Dispense Refill  . ALPRAZolam (XANAX) 1 MG tablet TAKE 1 TABLET BY MOUTH TWICE DAILY A NEEDED 60 tablet 2  . aspirin 325 MG tablet Take 325 mg by mouth daily.    [name]  Fexofenadine HCl (ALLEGRA ALLERGY PO) Take 1 tablet by mouth daily as needed.    [name] ibuprofen (ADVIL,MOTRIN) 200 MG tablet Take 400 mg by mouth every 6 (six) hours as needed.     . Insulin Syringe-Needle U-100 (B-D INS SYRINGE 0.5CC/31GX5/16) 31G X 5/16" 0.5 ML MISC Use daily as directed.  Dx E11.9 100 each 3  . lisinopril (ZESTRIL) 2.5 MG tablet Take 1 tablet (2.5 mg total) by mouth daily. 90 tablet 3  . metFORMIN (GLUCOPHAGE) 500 MG tablet TAKE 1 TABLET (500 MG TOTAL) BY MOUTH 2 (TWO) TIMES DAILY WITH A MEAL. 60 tablet 10  . metoprolol (LOPRESSOR) 100 MG tablet TAKE 0.5 TABLETS (50 MG TOTAL) BY MOUTH 2 (TWO) TIMES DAILY. 90 tablet 1  . Omega-3 Fatty Acids (FISH OIL) 1200 MG CAPS Take 2 capsules by mouth 3 (three) times daily with meals.    . ONE TOUCH ULTRA TEST test strip TEST 3X DAILY AS DIRECTED 150 each 6  . rosuvastatin (CRESTOR) 10 MG tablet Take 1 tablet (10 mg total)  by mouth daily. Dispense generic med 30 tablet 12  . triamcinolone cream (KENALOG) 0.1 % Apply 1 application topically daily. 30 g 2   No current facility-administered medications on file prior to visit.     No Known Allergies  Family History  Problem Relation Age of Onset  . Heart disease Father   . Colon cancer Neg Hx   . Prostate cancer Neg Hx   . Diabetes Neg Hx     BP 122/74   Pulse (!) 58   Ht 5' 11.5" (1.816 m)   Wt 228 lb (103.4 kg)   SpO2 97%   BMI 31.36 kg/m    Review of Systems denies weight loss, blurry vision, headache, chest pain, sob, n/v, muscle cramps, excessive diaphoresis, memory loss, cold intolerance, and easy bruising.  He has frequent urination and rhinorrhea.    Objective:   Physical Exam VS: see vs page GEN: no distress HEAD: head: no deformity eyes: no periorbital swelling, no proptosis external nose and ears are normal mouth: no lesion seen NECK: supple, thyroid is not enlarged CHEST WALL: no deformity.  Old healed surgical scar (median sternotomy) LUNGS: clear to  auscultation CV: reg rate and rhythm, no murmur. ABD: abdomen is soft, nontender.  no hepatosplenomegaly.  not distended.  no hernia MUSCULOSKELETAL: muscle bulk and strength are grossly normal.  no obvious joint swelling.  gait is normal and steady.  EXTEMITIES: no deformity.  no ulcer on the feet.  feet are of normal color and temp.  Trace bilat edema, and bilat vv's.  There is bilateral onychomycosis of the toenails.   PULSES: dorsalis pedis intact bilat.  no carotid bruit.   NEURO:  cn 2-12 grossly intact.   readily moves all 4's.  sensation is intact to touch on the feet, but decreased from normal.   SKIN:  Normal texture and temperature.  No rash or suspicious lesion is visible.   NODES:  None palpable at the neck.   PSYCH: alert, well-oriented.  Does not appear anxious nor depressed.   Lab Results  Component Value Date   HGBA1C 10.1 (H) 02/12/2016   I personally reviewed electrocardiogram tracing (05/07/14): Indication: CAD Impression: NSR.  No MI.  No hypertrophy. Compared to 2014: no change  I have reviewed outside records, and summarized: Pt was noted to have severely elevated a1c, and referred here.  Insulin titration was rx'ed.  Pt said this was helping.       Assessment & Plan:  Insulin-requiring type 2 DM: severe exacerbation: he may be evolving type 1 (per neg FHx and lean body habitus).  The pattern of his cbg's indicates he needs some adjustment in his therapy.  Patient is advised the following: Patient Instructions  good diet and exercise significantly improve the control of your diabetes.  please let me know if you wish to be referred to a dietician.  high blood sugar is very risky to your health.  you should see an eye doctor and dentist every year.  It is very important to get all recommended vaccinations.  Controlling your blood pressure and cholesterol drastically reduces the damage diabetes does to your body.  Those who smoke should quit.  Please discuss these with  your doctor.  check your blood sugar 4 times a day: before the 3 meals, and at bedtime.  also check if you have symptoms of your blood sugar being too high or too low.  please keep a record of the readings and bring it to your  next appointment here (or you can bring the meter itself).  You can write it on any piece of paper.  please call us sooner if your blood sugar goes below 70, or if you have a lot of readings over 200. We will need to take this complex situation in stages For now, please: Reduce the levemir to 40 units at bedtime, and: Increase the humalog to 10 units 3 times a day (just before each meal, no matter what your blood sugar is), and: Reduce the glipizide to 5 mg each morning, and none in the evening, and: Please continue the same metformin. Please come back for a follow-up appointment in 1 month.  Please call us next week, to tell us how the blood sugar is doing.

## 2016-03-24 NOTE — Patient Instructions (Addendum)
good diet and exercise significantly improve the control of your diabetes.  please let me know if you wish to be referred to a dietician.  high blood sugar is very risky to your health.  you should see an eye doctor and dentist every year.  It is very important to get all recommended vaccinations.  Controlling your blood pressure and cholesterol drastically reduces the damage diabetes does to your body.  Those who smoke should quit.  Please discuss these with your doctor.  check your blood sugar 4 times a day: before the 3 meals, and at bedtime.  also check if you have symptoms of your blood sugar being too high or too low.  please keep a record of the readings and bring it to your next appointment here (or you can bring the meter itself).  You can write it on any piece of paper.  please call us sooner if your blood sugar goes below 70, or if you have a lot of readings over 200. We will need to take this complex situation in stages For now, please: Reduce the levemir to 40 units at bedtime, and: Increase the humalog to 10 units 3 times a day (just before each meal, no matter what your blood sugar is), and: Reduce the glipizide to 5 mg each morning, and none in the evening, and: Please continue the same metformin. Please come back for a follow-up appointment in 1 month.  Please call us next week, to tell us how the blood sugar is doing.

## 2016-03-25 ENCOUNTER — Encounter: Payer: Self-pay | Admitting: Dietician

## 2016-03-25 ENCOUNTER — Encounter: Payer: Medicare Other | Admitting: Dietician

## 2016-03-25 VITALS — Ht 71.5 in | Wt 228.2 lb

## 2016-03-25 DIAGNOSIS — E119 Type 2 diabetes mellitus without complications: Secondary | ICD-10-CM

## 2016-03-25 DIAGNOSIS — Z713 Dietary counseling and surveillance: Secondary | ICD-10-CM | POA: Diagnosis not present

## 2016-03-25 NOTE — Patient Instructions (Signed)
   You are doing a great job including protein foods and vegetables, keep it up!  Work to include 3 carb servings with every meal, be as consistent as possible to have consistent blood sugar readings.   Try to make a note on your blood sugar record when you exercise, or when you have worse pain or Armanii't feel well. These things can affect blood sugars.

## 2016-03-25 NOTE — Progress Notes (Signed)
Diabetes Self-Management Education  Visit Type:  Follow-up  Appt. Start Time: 1035 Appt. End Time: 7169  03/25/2016  Craig Koch, identified by name and date of birth, is a 61 y.o. male with a diagnosis of Diabetes:  .   ASSESSMENT  Height 5' 11.5" (1.816 m), weight 228 lb 3.2 oz (103.5 kg). Body mass index is 31.38 kg/m.       Diabetes Self-Management Education - 67/89/38 1017      Complications   How often do you check your blood sugar? > 4 times/day   Fasting Blood glucose range (mg/dL) 70-129;130-179   Postprandial Blood glucose range (mg/dL) >200;180-200;130-179   Number of hypoglycemic episodes per month 0   Have you had a dilated eye exam in the past 12 months? Yes   Have you had a dental exam in the past 12 months? Yes  new plate in 05/1023   Are you checking your feet? Yes   How many days per week are you checking your feet? 7     Dietary Intake   Breakfast 3 meals and 0-1 snack daily   Beverage(s) coffee in am, otherwise water, occasionally diet cranberry juice     Exercise   Exercise Type Light (walking / raking leaves)  exercise bike   How many days per week to you exercise? 3   How many minutes per day do you exercise? 18   Total minutes per week of exercise 54     Patient Education   Nutrition management  Role of diet in the treatment of diabetes and the relationship between the three main macronutrients and blood glucose level;Meal options for control of blood glucose level and chronic complications.;Other (comment)  basic meal planning for 1900kcal; consistent carb intake   Physical activity and exercise  Role of exercise on diabetes management, blood pressure control and cardiac health.   Psychosocial adjustment Role of stress on diabetes     Post-Education Assessment   Patient understands the diabetes disease and treatment process. Demonstrates understanding / competency   Patient understands incorporating nutritional management into lifestyle.  Demonstrates understanding / competency   Patient undertands incorporating physical activity into lifestyle. Demonstrates understanding / competency   Patient understands using medications safely. Demonstrates understanding / competency   Patient understands monitoring blood glucose, interpreting and using results Demonstrates understanding / competency   Patient understands prevention, detection, and treatment of acute complications. Demonstrates understanding / competency   Patient understands prevention, detection, and treatment of chronic complications. Demonstrates understanding / competency   Patient understands how to develop strategies to address psychosocial issues. Demonstrates understanding / competency   Patient understands how to develop strategies to promote health/change behavior. Demonstrates understanding / competency     Outcomes   Program Status Completed      Learning Objective:  Patient will have a greater understanding of diabetes self-management. Patient education plan is to attend individual and/or group sessions per assessed needs and concerns.  Used patient's food diary to instruct on meal planning process and assist patient in achieving more consistent carbohydrate intake.   Plan:   Patient Instructions   You are doing a great job including protein foods and vegetables, keep it up!  Work to include 3 carb servings with every meal, be as consistent as possible to have consistent blood sugar readings.   Try to make a note on your blood sugar record when you exercise, or when you have worse pain or Johannes't feel well. These things can affect blood  sugars.     Expected Outcomes:  Demonstrated interest in learning. Expect positive outcomes  Education material provided: Planning A Balanced Meal, Plate Planner, personalized food record.   If problems or questions, patient to contact team via:  Phone

## 2016-03-31 ENCOUNTER — Telehealth: Payer: Self-pay | Admitting: Endocrinology

## 2016-03-31 NOTE — Telephone Encounter (Signed)
Patient stated Dr Loanne Drilling told him to call him and let him know how the insulin  was working,  He said everything is working well.

## 2016-03-31 NOTE — Telephone Encounter (Signed)
See message to be advised, Thanks!

## 2016-04-25 ENCOUNTER — Ambulatory Visit (INDEPENDENT_AMBULATORY_CARE_PROVIDER_SITE_OTHER): Payer: Medicare Other | Admitting: Endocrinology

## 2016-04-25 ENCOUNTER — Encounter: Payer: Self-pay | Admitting: Endocrinology

## 2016-04-25 VITALS — BP 124/72 | HR 54 | Ht 71.5 in | Wt 232.4 lb

## 2016-04-25 DIAGNOSIS — E118 Type 2 diabetes mellitus with unspecified complications: Secondary | ICD-10-CM | POA: Diagnosis not present

## 2016-04-25 LAB — POCT GLYCOSYLATED HEMOGLOBIN (HGB A1C): Hemoglobin A1C: 8.6

## 2016-04-25 MED ORDER — INSULIN LISPRO 100 UNIT/ML ~~LOC~~ SOLN: 15.0000 [IU] | Freq: Three times a day (TID) | SUBCUTANEOUS | 11 refills | Status: DC

## 2016-04-25 NOTE — Patient Instructions (Addendum)
check your blood sugar 4 times a day: before the 3 meals, and at bedtime.  also check if you have symptoms of your blood sugar being too high or too low.  please keep a record of the readings and bring it to your next appointment here (or you can bring the meter itself).  You can write it on any piece of paper.  please call us sooner if your blood sugar goes below 70, or if you have a lot of readings over 200. We will need to take this complex situation in stages.   For now, please: Please continue the same levemir, and: Increase the humalog to 15 units 3 times a day (just before each meal, no matter what your blood sugar is), and: Stop taking the glipizide, and: Please continue the same metformin.   Please come back for a follow-up appointment in 1 month.

## 2016-04-25 NOTE — Progress Notes (Signed)
Subjective:    Patient ID: Craig Koch, male    DOB: [birth year], 61 y.o.   MRN: [id number]  HPI  Pt returns for f/u of diabetes mellitus: DM type: Insulin-requiring type 2 Dx'ed: 5784 Complications: polyneuropathy and CAD Therapy: insulin since 2014 DKA: never Severe hypoglycemia: never Pancreatitis: never Other: he takes multiple daily injections Interval history: He brings a record of his cbg's which I have reviewed today.  It varies from 106-200's. It is in general higher after eating.   Past Medical History:  Diagnosis Date  . Anxiety   . Arthritis   . Coronary artery disease   . Diabetes mellitus   . Dyslipidemia   . Hx of CABG   . Hyperlipidemia   . Hypertension     Past Surgical History:  Procedure Laterality Date  . CORONARY ARTERY BYPASS GRAFT     (CABG with LIMA to the  LAD, SVG to OM2/OM3, SVG  to diag  . Left ankle surgery     repair of fracture  . Right lower leg surgery     rod    Social History   Social History  . Marital status: Married    Spouse name: N/A  . Number of children: N/A  . Years of education: N/A   Occupational History  . Not on file.   Social History Main Topics  . Smoking status: Current Every Day Smoker    Packs/day: 0.50    Types: Cigarettes    Last attempt to quit: 07/06/2013  . Smokeless tobacco: Former Systems developer    Types: Snuff     Comment: Chews on cigars and dips.   . Alcohol use 0.0 - 3.6 oz/week  . Drug use: No  . Sexual activity: No   Other Topics Concern  . Not on file   Social History Narrative   On disability 2009 after prev injuries and CAD.     Married 1976   2 kids    Current Outpatient Prescriptions on File Prior to Visit  Medication Sig Dispense Refill  . ALPRAZolam (XANAX) 1 MG tablet TAKE 1 TABLET BY MOUTH TWICE DAILY A NEEDED 60 tablet 2  . aspirin 325 MG tablet Take 325 mg by mouth daily.    [name] Fexofenadine HCl (ALLEGRA ALLERGY PO) Take 1 tablet by mouth daily as needed.    [name] ibuprofen  (ADVIL,MOTRIN) 200 MG tablet Take 400 mg by mouth every 6 (six) hours as needed.     . insulin detemir (LEVEMIR) 100 UNIT/ML injection Inject 0.4 mLs (40 Units total) into the skin at bedtime. 10 mL 5  . Insulin Syringe-Needle U-100 (B-D INS SYRINGE 0.5CC/31GX5/16) 31G X 5/16" 0.5 ML MISC Use daily as directed.  Dx E11.9 100 each 3  . lisinopril (ZESTRIL) 2.5 MG tablet Take 1 tablet (2.5 mg total) by mouth daily. 90 tablet 3  . metFORMIN (GLUCOPHAGE) 500 MG tablet TAKE 1 TABLET (500 MG TOTAL) BY MOUTH 2 (TWO) TIMES DAILY WITH A MEAL. (Patient taking differently: Take 500 mg by mouth daily. ) 60 tablet 10  . metoprolol (LOPRESSOR) 100 MG tablet TAKE 0.5 TABLETS (50 MG TOTAL) BY MOUTH 2 (TWO) TIMES DAILY. 90 tablet 1  . Omega-3 Fatty Acids (FISH OIL) 1200 MG CAPS Take 2 capsules by mouth 3 (three) times daily with meals.    . ONE TOUCH ULTRA TEST test strip TEST 3X DAILY AS DIRECTED 150 each 6  . rosuvastatin (CRESTOR) 10 MG tablet Take 1 tablet (10 mg total) by  mouth daily. Dispense generic med 30 tablet 12  . triamcinolone cream (KENALOG) 0.1 % Apply 1 application topically daily. 30 g 2   No current facility-administered medications on file prior to visit.     No Known Allergies  Family History  Problem Relation Age of Onset  . Heart disease Father   . Colon cancer Neg Hx   . Prostate cancer Neg Hx   . Diabetes Neg Hx     BP 124/72   Pulse (!) 54   Ht 5' 11.5" (1.816 m)   Wt 232 lb 6.4 oz (105.4 kg)   SpO2 97%   BMI 31.96 kg/m   Review of Systems He denies hypoglycemia.     Objective:   Physical Exam VITAL SIGNS:  See vs page GENERAL: no distress Pulses: dorsalis pedis intact bilat.   MSK: no deformity of the feet CV: 1+ bilat leg edema, and bilat vv's.  Skin:  no ulcer on the feet.  normal color and temp on the feet.  Neuro: sensation is intact to touch on the feet, but decreased from normal.   Ext: There is bilateral onychomycosis of the toenails.    a1c=8.6%      Assessment & Plan:  Insulin-requiring type 2 DM, with CAD: he needs increased rx.    Patient Instructions  check your blood sugar 4 times a day: before the 3 meals, and at bedtime.  also check if you have symptoms of your blood sugar being too high or too low.  please keep a record of the readings and bring it to your next appointment here (or you can bring the meter itself).  You can write it on any piece of paper.  please call us sooner if your blood sugar goes below 70, or if you have a lot of readings over 200. We will need to take this complex situation in stages.   For now, please: Please continue the same levemir, and: Increase the humalog to 15 units 3 times a day (just before each meal, no matter what your blood sugar is), and: Stop taking the glipizide, and: Please continue the same metformin.   Please come back for a follow-up appointment in 1 month.

## 2016-05-04 ENCOUNTER — Other Ambulatory Visit: Payer: Self-pay | Admitting: Family Medicine

## 2016-05-05 ENCOUNTER — Ambulatory Visit (INDEPENDENT_AMBULATORY_CARE_PROVIDER_SITE_OTHER)
Admission: RE | Admit: 2016-05-05 | Discharge: 2016-05-05 | Disposition: A | Discharge status: Home / Self Care | Payer: Medicare Other | Source: Ambulatory Visit | Attending: Family Medicine | Admitting: Family Medicine

## 2016-05-05 ENCOUNTER — Encounter: Payer: Self-pay | Admitting: Family Medicine

## 2016-05-05 ENCOUNTER — Ambulatory Visit (INDEPENDENT_AMBULATORY_CARE_PROVIDER_SITE_OTHER): Payer: Medicare Other | Admitting: Family Medicine

## 2016-05-05 VITALS — BP 128/70 | HR 74 | Temp 97.8°F | Wt 227.8 lb

## 2016-05-05 DIAGNOSIS — M79644 Pain in right finger(s): Secondary | ICD-10-CM | POA: Diagnosis not present

## 2016-05-05 DIAGNOSIS — S6991XA Unspecified injury of right wrist, hand and finger(s), initial encounter: Secondary | ICD-10-CM | POA: Diagnosis not present

## 2016-05-05 DIAGNOSIS — B369 Superficial mycosis, unspecified: Secondary | ICD-10-CM | POA: Diagnosis not present

## 2016-05-05 DIAGNOSIS — M65341 Trigger finger, right ring finger: Secondary | ICD-10-CM | POA: Diagnosis not present

## 2016-05-05 MED ORDER — NYSTATIN 100000 UNIT/GM EX POWD: Freq: Three times a day (TID) | CUTANEOUS | 1 refills | Status: DC

## 2016-05-05 MED ORDER — METFORMIN HCL 500 MG PO TABS: 500.0000 mg | ORAL_TABLET | Freq: Two times a day (BID) | ORAL | Status: DC

## 2016-05-05 NOTE — Patient Instructions (Signed)
Likely a fungal rash.  Use the nystatin powder in the meantime.  When better, then change over to talc.   Update me if not better.  Take care.  Glad to see you.

## 2016-05-05 NOTE — Progress Notes (Signed)
Rash.  On abdomen.  Started about 1 month ago, about the time his wife changed laundry detergent.  Feels irritated. Sugar has been improved recently, following up with endocrine.  R 4th PIP triggering recently.  Painful.  Injured about 3 weeks ago.  Had a bucket in his hand.  A cow kicked the bucket and the bucket hit his finger.  Pain with making a fist.    Meds, vitals, and allergies reviewed.   ROS: Per HPI unless specifically indicated in ROS section   nad ncat rrr ctab Fungal rash under pannus.  No fluctuant mass Right hand with pain on making a fist, especially at the right fourth PIP. He does have triggering noted on range of motion. Distally neurovascularly intact.  X-rays without fracture, discussed with patient.

## 2016-05-05 NOTE — Progress Notes (Signed)
Pre visit review using our clinic review tool, if applicable. No additional management support is needed unless otherwise documented below in the visit note. 

## 2016-05-06 DIAGNOSIS — M6530 Trigger finger, unspecified finger: Secondary | ICD-10-CM | POA: Insufficient documentation

## 2016-05-06 DIAGNOSIS — B369 Superficial mycosis, unspecified: Secondary | ICD-10-CM | POA: Insufficient documentation

## 2016-05-06 NOTE — Assessment & Plan Note (Signed)
See notes on imaging.

## 2016-05-06 NOTE — Assessment & Plan Note (Signed)
Discussed with patient. Diabetes is improving. Use nystatin. Keep area dry. Switch over to talc when improved. Update me as needed.

## 2016-05-11 ENCOUNTER — Telehealth: Payer: Self-pay | Admitting: Family Medicine

## 2016-05-11 MED ORDER — GLUCOSE BLOOD VI STRP: 1.0000 | ORAL_STRIP | Freq: Four times a day (QID) | 3 refills | Status: DC

## 2016-05-11 NOTE — Telephone Encounter (Signed)
Patient said he needs to get the test strips taken 4 times a day instead of 3 times a day.  He's going to run out, so he needs a different prescription to have it checked 4 times a day.

## 2016-05-11 NOTE — Telephone Encounter (Signed)
I sent rx

## 2016-05-25 ENCOUNTER — Ambulatory Visit (INDEPENDENT_AMBULATORY_CARE_PROVIDER_SITE_OTHER): Payer: Medicare Other | Admitting: Endocrinology

## 2016-05-25 ENCOUNTER — Encounter: Payer: Self-pay | Admitting: Endocrinology

## 2016-05-25 VITALS — BP 132/78 | HR 56 | Ht 71.5 in | Wt 226.0 lb

## 2016-05-25 DIAGNOSIS — E118 Type 2 diabetes mellitus with unspecified complications: Secondary | ICD-10-CM

## 2016-05-25 DIAGNOSIS — E785 Hyperlipidemia, unspecified: Secondary | ICD-10-CM

## 2016-05-25 DIAGNOSIS — E1159 Type 2 diabetes mellitus with other circulatory complications: Secondary | ICD-10-CM

## 2016-05-25 DIAGNOSIS — Z794 Long term (current) use of insulin: Secondary | ICD-10-CM

## 2016-05-25 LAB — LIPID PANEL
Cholesterol: 105 mg/dL (ref 0–200)
HDL: 39.2 mg/dL (ref >39.00)
LDL Cholesterol: 51 mg/dL (ref 0–99)
NonHDL: 65.81
Total CHOL/HDL Ratio: 3
Triglycerides: 75 mg/dL (ref 0.0–149.0)
VLDL: 15 mg/dL (ref 0.0–40.0)

## 2016-05-25 NOTE — Progress Notes (Signed)
Subjective:    Patient ID: Craig Koch, male    DOB: May 01, 1955, 61 y.o.   MRN: [id number]  HPI Pt returns for f/u of diabetes mellitus: DM type: Insulin-requiring type 2 Dx'ed: 3664 Complications: polyneuropathy and CAD Therapy: insulin since 2014 DKA: never Severe hypoglycemia: never Pancreatitis: never Other: he takes multiple daily injections Interval history: He brings a record of his cbg's which I have reviewed today.  It varies from 80-200.  It is in general lowest at lunch, and highest at hs. pt states he feels well in general.   Past Medical History:  Diagnosis Date  . Anxiety   . Arthritis   . Coronary artery disease   . Diabetes mellitus   . Dyslipidemia   . Hx of CABG   . Hyperlipidemia   . Hypertension     Past Surgical History:  Procedure Laterality Date  . CORONARY ARTERY BYPASS GRAFT     (CABG with LIMA to the  LAD, SVG to OM2/OM3, SVG  to diag  . Left ankle surgery     repair of fracture  . Right lower leg surgery     rod    Social History   Social History  . Marital status: Married    Spouse name: N/A  . Number of children: N/A  . Years of education: N/A   Occupational History  . Not on file.   Social History Main Topics  . Smoking status: Current Every Day Smoker    Packs/day: 0.50    Types: Cigarettes    Last attempt to quit: 07/06/2013  . Smokeless tobacco: Former Systems developer    Types: Snuff     Comment: Chews on cigars and dips.   . Alcohol use 0.0 - 3.6 oz/week  . Drug use: No  . Sexual activity: No   Other Topics Concern  . Not on file   Social History Narrative   On disability 2009 after prev injuries and CAD.     Married 1976   2 kids    Current Outpatient Prescriptions on File Prior to Visit  Medication Sig Dispense Refill  . ALPRAZolam (XANAX) 1 MG tablet TAKE 1 TABLET BY MOUTH TWICE DAILY A NEEDED 60 tablet 2  . aspirin 325 MG tablet Take 325 mg by mouth daily.    [name] Fexofenadine HCl (ALLEGRA ALLERGY PO) Take 1 tablet  by mouth daily as needed.    [name] glucose blood (ONE TOUCH ULTRA TEST) test strip 1 each by Other route 4 (four) times daily. And lancets 4/day 200 each 3  . ibuprofen (ADVIL,MOTRIN) 200 MG tablet Take 400 mg by mouth every 6 (six) hours as needed.     . insulin detemir (LEVEMIR) 100 UNIT/ML injection Inject 0.4 mLs (40 Units total) into the skin at bedtime. 10 mL 5  . insulin lispro (HUMALOG) 100 UNIT/ML injection Inject 0.15 mLs (15 Units total) into the skin 3 (three) times daily with meals. 20 mL 11  . Insulin Syringe-Needle U-100 (B-D INS SYRINGE 0.5CC/31GX5/16) 31G X 5/16" 0.5 ML MISC Use daily as directed.  Dx E11.9 100 each 3  . lisinopril (PRINIVIL,ZESTRIL) 2.5 MG tablet TAKE 1 TABLET BY MOUTH DAILY 90 tablet 2  . metFORMIN (GLUCOPHAGE) 500 MG tablet Take 1 tablet (500 mg total) by mouth 2 (two) times daily with a meal.    . metoprolol (LOPRESSOR) 100 MG tablet TAKE 0.5 TABLETS (50 MG TOTAL) BY MOUTH 2 (TWO) TIMES DAILY. 90 tablet 1  . nystatin (MYCOSTATIN/NYSTOP) powder  Apply topically 3 (three) times daily. 30 g 1  . Omega-3 Fatty Acids (FISH OIL) 1200 MG CAPS Take 2 capsules by mouth 3 (three) times daily with meals.    . rosuvastatin (CRESTOR) 10 MG tablet Take 1 tablet (10 mg total) by mouth daily. Dispense generic med 30 tablet 12  . triamcinolone cream (KENALOG) 0.1 % Apply 1 application topically daily. 30 g 2   No current facility-administered medications on file prior to visit.     No Known Allergies  Family History  Problem Relation Age of Onset  . Heart disease Father   . Colon cancer Neg Hx   . Prostate cancer Neg Hx   . Diabetes Neg Hx     BP 132/78   Pulse (!) 56   Ht 5' 11.5" (1.816 m)   Wt 226 lb (102.5 kg)   SpO2 97%   BMI 31.08 kg/m    Review of Systems He denies hypoglycemia    Objective:   Physical Exam VITAL SIGNS:  See vs page GENERAL: no distress Pulses: dorsalis pedis intact bilat.   MSK: no deformity of the feet CV: trace bilat leg edema,  and bilat vv's.  Skin:  no ulcer on the feet.  normal color and temp on the feet.  Neuro: sensation is intact to touch on the feet, but decreased from normal.   Ext: There is bilateral onychomycosis of the toenails.        Assessment & Plan:  Insulin-requiring type 2 DM, with CAD: uncertain control.  Check fructosamine Dyslipidemia: well-controlled.  Please continue the same medication  Patient Instructions  check your blood sugar 4 times a day: before the 3 meals, and at bedtime.  also check if you have symptoms of your blood sugar being too high or too low.  please keep a record of the readings and bring it to your next appointment here (or you can bring the meter itself).  You can write it on any piece of paper.  please call us sooner if your blood sugar goes below 70, or if you have a lot of readings over 200. Please continue the same medications. blood tests are requested for you today.  We'll let you know about the results.  If the sugar is too low, we'll decrease the breakfast insulin.   Please come back for a follow-up appointment in 3-4 months.

## 2016-05-25 NOTE — Patient Instructions (Addendum)
check your blood sugar 4 times a day: before the 3 meals, and at bedtime.  also check if you have symptoms of your blood sugar being too high or too low.  please keep a record of the readings and bring it to your next appointment here (or you can bring the meter itself).  You can write it on any piece of paper.  please call us sooner if your blood sugar goes below 70, or if you have a lot of readings over 200. Please continue the same medications. blood tests are requested for you today.  We'll let you know about the results.  If the sugar is too low, we'll decrease the breakfast insulin.   Please come back for a follow-up appointment in 3-4 months.

## 2016-05-27 LAB — FRUCTOSAMINE: Fructosamine: 274 umol/L — ABNORMAL HIGH (ref 190–270)

## 2016-06-29 ENCOUNTER — Other Ambulatory Visit: Payer: Self-pay | Admitting: Family Medicine

## 2016-06-29 NOTE — Telephone Encounter (Signed)
Received refill electronically Last refill 03/09/16 #60/2 Last office visit 05/05/16

## 2016-06-30 NOTE — Telephone Encounter (Signed)
Medication phoned to pharmacy.  

## 2016-06-30 NOTE — Telephone Encounter (Signed)
Please call in.  Thanks.   

## 2016-07-04 NOTE — Progress Notes (Signed)
Subjective:   Craig Koch is a 61 y.o. male who presents for Medicare Annual/Subsequent preventive examination.  Review of Systems:  No ROS.  Medicare Wellness Visit. Additional risk factors are reflected in the social history.  Cardiac Risk Factors include: advanced age (>73men, >66 women);dyslipidemia;diabetes mellitus;hypertension;male gender;smoking/ tobacco exposure;obesity (BMI >30kg/m2)     Objective:    Vitals: BP 138/80   Pulse (!) 56   Resp 16   Ht 5\' 11"  (1.803 m)   Wt 224 lb 12 oz (101.9 kg)   SpO2 98%   BMI 31.35 kg/m   Body mass index is 31.35 kg/m.  Tobacco History  Smoking Status  . Current Every Day Smoker  . Packs/day: 0.50  . Types: Cigarettes  . Last attempt to quit: 07/06/2013  Smokeless Tobacco  . Former Systems developer  . Types: Snuff     Ready to quit: No Counseling given: No   Past Medical History:  Diagnosis Date  . Anxiety   . Arthritis   . Coronary artery disease   . Diabetes mellitus   . Dyslipidemia   . Hx of CABG   . Hyperlipidemia   . Hypertension    Past Surgical History:  Procedure Laterality Date  . CATARACT EXTRACTION Left 09/2015  . CORONARY ARTERY BYPASS GRAFT     (CABG with LIMA to the  LAD, SVG to OM2/OM3, SVG  to diag  . Left ankle surgery     repair of fracture  . Right lower leg surgery     rod   Family History  Problem Relation Age of Onset  . Heart disease Father   . Colon cancer Neg Hx   . Prostate cancer Neg Hx   . Diabetes Neg Hx    History  Sexual Activity  . Sexual activity: No    Outpatient Encounter Prescriptions as of 07/11/2016  Medication Sig  . ALPRAZolam (XANAX) 1 MG tablet TAKE 1 TABLET BY MOUTH TWICE DAILY AS NEEDED  . aspirin 325 MG tablet Take 325 mg by mouth daily.  [name] Fexofenadine HCl (ALLEGRA ALLERGY PO) Take 1 tablet by mouth daily as needed.  [name] glucose blood (ONE TOUCH ULTRA TEST) test strip 1 each by Other route 4 (four) times daily. And lancets 4/day  . ibuprofen (ADVIL,MOTRIN) 200  MG tablet Take 400 mg by mouth every 6 (six) hours as needed.   . insulin detemir (LEVEMIR) 100 UNIT/ML injection Inject 0.4 mLs (40 Units total) into the skin at bedtime.  . insulin lispro (HUMALOG) 100 UNIT/ML injection Inject 0.15 mLs (15 Units total) into the skin 3 (three) times daily with meals. (Patient taking differently: Inject 10 Units into the skin 3 (three) times daily with meals. )  . Insulin Syringe-Needle U-100 (B-D INS SYRINGE 0.5CC/31GX5/16) 31G X 5/16" 0.5 ML MISC Use daily as directed.  Dx E11.9  . lisinopril (PRINIVIL,ZESTRIL) 2.5 MG tablet TAKE 1 TABLET BY MOUTH DAILY  . metFORMIN (GLUCOPHAGE) 500 MG tablet Take 1 tablet (500 mg total) by mouth 2 (two) times daily with a meal.  . metoprolol (LOPRESSOR) 100 MG tablet TAKE 0.5 TABLETS (50 MG TOTAL) BY MOUTH 2 (TWO) TIMES DAILY.  [name] nystatin (MYCOSTATIN/NYSTOP) powder Apply topically 3 (three) times daily.  . Omega-3 Fatty Acids (FISH OIL) 1200 MG CAPS Take 2 capsules by mouth 3 (three) times daily with meals.  . rosuvastatin (CRESTOR) 10 MG tablet Take 1 tablet (10 mg total) by mouth daily. Dispense generic med  . triamcinolone cream (KENALOG) 0.1 %  Apply 1 application topically daily.   No facility-administered encounter medications on file as of 07/11/2016.     Activities of Daily Living In your present state of health, do you have any difficulty performing the following activities: 07/11/2016  Hearing? N  Vision? N  Difficulty concentrating or making decisions? N  Walking or climbing stairs? N  Dressing or bathing? N  Doing errands, shopping? N  Preparing Food and eating ? N  Using the Toilet? N  In the past six months, have you accidently leaked urine? N  Do you have problems with loss of bowel control? N  Managing your Medications? N  Managing your Finances? N  Housekeeping or managing your Housekeeping? N  Some recent data might be hidden    Patient Care Team: Tonia Ghent, MD as PCP - General (Family  Medicine) Thelma Comp, OD (Optometry)   Assessment:    Physical assessment deferred to PCP.  Exercise Activities and Dietary recommendations Current Exercise Habits: Home exercise routine, Type of exercise: Other - see comments (yard work, stationary bike), Time (Minutes): 20, Frequency (Times/Week): 4, Weekly Exercise (Minutes/Week): 80, Intensity: Moderate  Goals    . Reduce fat intake          Starting 07/11/16, I will continue to decrease carb and fat intake in an effort to decrease A1C.       Fall Risk Fall Risk  07/11/2016 03/25/2016 03/08/2016 03/16/2015  Falls in the past year? No No No Yes  Number falls in past yr: - - - 1  Injury with Fall? - - - No  Risk for fall due to : - - - Impaired balance/gait  Follow up - - - Falls evaluation completed   Depression Screen PHQ 2/9 Scores 07/11/2016 03/08/2016 03/16/2015  PHQ - 2 Score 2 0 0  PHQ- 9 Score 6 - -    Cognitive Function      Mini-Cog - 07/11/16 1135    Normal clock drawing test? yes   How many words correct? 3      PLEASE NOTE: A Mini-Cog screen was completed. Maximum score is 20. A value of 0 denotes this part of Folstein MMSE was not completed or the patient failed this part of the Mini-Cog screening.   Mini-Cog Screening Orientation to Time - Max 5 pts Orientation to Place - Max 5 pts Registration - Max 3 pts Recall - Max 3 pts Language Repeat - Max 1 pts Language Follow 3 Step Command - Max 3 pts  MMSE - Mini Mental State Exam 07/11/2016 03/16/2015  Orientation to time 5 5  Orientation to Place 5 5  Registration 3 3  Attention/ Calculation 0 5  Recall 3 3  Language- name 2 objects 0 0  Language- repeat 1 1  Language- follow 3 step command 3 3  Language- read & follow direction 0 1  Write a sentence 0 0  Copy design 0 0  Total score 20 26         There is no immunization history on file for this patient. Screening Tests Health Maintenance  Topic Date Due  . COLON CANCER SCREENING ANNUAL  FOBT  04/04/2015  . OPHTHALMOLOGY EXAM  06/03/2016  . PNEUMOCOCCAL POLYSACCHARIDE VACCINE (1) 03/14/2020 (Originally 03/16/1957)  . INFLUENZA VACCINE  08/03/2016  . HEMOGLOBIN A1C  10/25/2016  . FOOT EXAM  05/25/2017  . TETANUS/TDAP  03/03/2020  . Hepatitis C Screening  Completed  . HIV Screening  Completed  Plan:    Follow-up w/ PCP as scheduled.  I have personally reviewed and noted the following in the patient's chart:   . Medical and social history . Use of alcohol, tobacco or illicit drugs  . Current medications and supplements . Functional ability and status . Nutritional status . Physical activity . Advanced directives . List of other physicians . Vitals . Screenings to include cognitive, depression, and falls . Referrals and appointments  In addition, I have reviewed and discussed with patient certain preventive protocols, quality metrics, and best practice recommendations. A written personalized care plan for preventive services as well as general preventive health recommendations were provided to patient.     Dorrene German, RN  07/11/2016

## 2016-07-07 NOTE — Progress Notes (Signed)
PCP notes:   Health maintenance:  IFOB due.  Patient declines all immunizations.   Abnormal screenings:   Hearing-failed  Depression- PHQ-9=6    Patient concerns: None.   Nurse concerns: None.   Next PCP appt: 07/15/16 @ 8:45am   I reviewed health advisor's note, was available for consultation on the day of service listed in this note, and agree with documentation and plan. Elsie Stain, MD.

## 2016-07-11 ENCOUNTER — Other Ambulatory Visit: Payer: Self-pay | Admitting: Family Medicine

## 2016-07-11 ENCOUNTER — Ambulatory Visit (INDEPENDENT_AMBULATORY_CARE_PROVIDER_SITE_OTHER): Payer: Medicare Other

## 2016-07-11 ENCOUNTER — Other Ambulatory Visit (INDEPENDENT_AMBULATORY_CARE_PROVIDER_SITE_OTHER): Payer: Medicare Other

## 2016-07-11 VITALS — BP 138/80 | HR 56 | Resp 16 | Ht 71.0 in | Wt 224.8 lb

## 2016-07-11 DIAGNOSIS — E118 Type 2 diabetes mellitus with unspecified complications: Secondary | ICD-10-CM

## 2016-07-11 DIAGNOSIS — Z0000 Encounter for general adult medical examination without abnormal findings: Secondary | ICD-10-CM | POA: Diagnosis not present

## 2016-07-11 LAB — COMPREHENSIVE METABOLIC PANEL
ALT: 16 U/L (ref 0–53)
AST: 13 U/L (ref 0–37)
Albumin: 4.1 g/dL (ref 3.5–5.2)
Alkaline Phosphatase: 66 U/L (ref 39–117)
BUN: 20 mg/dL (ref 6–23)
CO2: 28 mEq/L (ref 19–32)
Calcium: 9.7 mg/dL (ref 8.4–10.5)
Chloride: 103 mEq/L (ref 96–112)
Creatinine, Ser: 0.86 mg/dL (ref 0.40–1.50)
GFR: 95.99 mL/min (ref >60.00)
Glucose, Bld: 86 mg/dL (ref 70–99)
Potassium: 4.1 mEq/L (ref 3.5–5.1)
Sodium: 139 mEq/L (ref 135–145)
Total Bilirubin: 0.4 mg/dL (ref 0.2–1.2)
Total Protein: 7.2 g/dL (ref 6.0–8.3)

## 2016-07-11 LAB — LIPID PANEL
Cholesterol: 117 mg/dL (ref 0–200)
HDL: 45.7 mg/dL
LDL Cholesterol: 54 mg/dL (ref 0–99)
NonHDL: 71.73
Total CHOL/HDL Ratio: 3
Triglycerides: 89 mg/dL (ref 0.0–149.0)
VLDL: 17.8 mg/dL (ref 0.0–40.0)

## 2016-07-11 LAB — HEMOGLOBIN A1C: Hgb A1c MFr Bld: 8.6 % — ABNORMAL HIGH (ref 4.6–6.5)

## 2016-07-11 NOTE — Patient Instructions (Signed)
Craig Koch , Thank you for taking time to come for your Medicare Wellness Visit. I appreciate your ongoing commitment to your health goals. Please review the following plan we discussed and let me know if I can assist you in the future.   These are the goals we discussed: Goals    . Reduce fat intake          Starting 07/11/16, I will continue to decrease carb and fat intake in an effort to decrease A1C.        This is a list of the screening recommended for you and due dates:  Health Maintenance  Topic Date Due  . Stool Blood Test  04/04/2015  . Eye exam for diabetics  06/03/2016  . Pneumococcal vaccine (1) 03/14/2020*  . Flu Shot  08/03/2016  . Hemoglobin A1C  10/25/2016  . Complete foot exam   05/25/2017  . Tetanus Vaccine  03/03/2020  .  Hepatitis C: One time screening is recommended by Center for Disease Control  (CDC) for  adults born from [birth year] through 1965.   Completed  . HIV Screening  Completed  *Topic was postponed. The date shown is not the original due date.   Preventive Care for Adults  A healthy lifestyle and preventive care can promote health and wellness. Preventive health guidelines for adults include the following key practices.  . A routine yearly physical is a good way to check with your health care provider about your health and preventive screening. It is a chance to share any concerns and updates on your health and to receive a thorough exam.  . Visit your dentist for a routine exam and preventive care every 6 months. Brush your teeth twice a day and floss once a day. Good oral hygiene prevents tooth decay and gum disease.  . The frequency of eye exams is based on your age, health, family medical history, use  of contact lenses, and other factors. Follow your health care provider's ecommendations for frequency of eye exams.  . Eat a healthy diet. Foods like vegetables, fruits, whole grains, low-fat dairy products, and lean protein foods contain the  nutrients you need without too many calories. Decrease your intake of foods high in solid fats, added sugars, and salt. Eat the right amount of calories for you. Get information about a proper diet from your health care provider, if necessary.  . Regular physical exercise is one of the most important things you can do for your health. Most adults should get at least 150 minutes of moderate-intensity exercise (any activity that increases your heart rate and causes you to sweat) each week. In addition, most adults need muscle-strengthening exercises on 2 or more days a week.  Silver Sneakers may be a benefit available to you. To determine eligibility, you may visit the website: www.silversneakers.com or contact program at (701)306-1748 Mon-Fri between 8AM-8PM.   . Maintain a healthy weight. The body mass index (BMI) is a screening tool to identify possible weight problems. It provides an estimate of body fat based on height and weight. Your health care provider can find your BMI and can help you achieve or maintain a healthy weight.   For adults 20 years and older: ? A BMI below 18.5 is considered underweight. ? A BMI of 18.5 to 24.9 is normal. ? A BMI of 25 to 29.9 is considered overweight. ? A BMI of 30 and above is considered obese.   . Maintain normal blood lipids and cholesterol levels by  exercising and minimizing your intake of saturated fat. Eat a balanced diet with plenty of fruit and vegetables. Blood tests for lipids and cholesterol should begin at age 55 and be repeated every 5 years. If your lipid or cholesterol levels are high, you are over 50, or you are at high risk for heart disease, you may need your cholesterol levels checked more frequently. Ongoing high lipid and cholesterol levels should be treated with medicines if diet and exercise are not working.  . If you smoke, find out from your health care provider how to quit. If you do not use tobacco, please do not start.  . If you  choose to drink alcohol, please do not consume more than 2 drinks per day. One drink is considered to be 12 ounces (355 mL) of beer, 5 ounces (148 mL) of wine, or 1.5 ounces (44 mL) of liquor.  . If you are 7-10 years old, ask your health care provider if you should take aspirin to prevent strokes.  . Use sunscreen. Apply sunscreen liberally and repeatedly throughout the day. You should seek shade when your shadow is shorter than you. Protect yourself by wearing long sleeves, pants, a wide-brimmed hat, and sunglasses year round, whenever you are outdoors.  . Once a month, do a whole body skin exam, using a mirror to look at the skin on your back. Tell your health care provider of new moles, moles that have irregular borders, moles that are larger than a pencil eraser, or moles that have changed in shape or color.

## 2016-07-15 ENCOUNTER — Ambulatory Visit (INDEPENDENT_AMBULATORY_CARE_PROVIDER_SITE_OTHER): Payer: Medicare Other | Admitting: Family Medicine

## 2016-07-15 ENCOUNTER — Encounter: Payer: Self-pay | Admitting: Family Medicine

## 2016-07-15 VITALS — BP 102/60 | HR 59 | Temp 98.0°F | Ht 71.0 in | Wt 222.5 lb

## 2016-07-15 DIAGNOSIS — E118 Type 2 diabetes mellitus with unspecified complications: Secondary | ICD-10-CM | POA: Diagnosis not present

## 2016-07-15 DIAGNOSIS — Z1211 Encounter for screening for malignant neoplasm of colon: Secondary | ICD-10-CM | POA: Diagnosis not present

## 2016-07-15 DIAGNOSIS — Z789 Other specified health status: Secondary | ICD-10-CM

## 2016-07-15 DIAGNOSIS — F411 Generalized anxiety disorder: Secondary | ICD-10-CM

## 2016-07-15 DIAGNOSIS — Z7189 Other specified counseling: Secondary | ICD-10-CM

## 2016-07-15 DIAGNOSIS — B369 Superficial mycosis, unspecified: Secondary | ICD-10-CM | POA: Diagnosis not present

## 2016-07-15 DIAGNOSIS — E785 Hyperlipidemia, unspecified: Secondary | ICD-10-CM

## 2016-07-15 DIAGNOSIS — I10 Essential (primary) hypertension: Secondary | ICD-10-CM

## 2016-07-15 NOTE — Progress Notes (Signed)
DM2.  Had been seeing endo.  A1c stable, d/w pt..  Improved from prev historical values.  Rare lows but he tapers his mealtime insulin if premeal sugar is lower.  D/w pt.  Still on 40 units long acting insulin.  He'll call eye exam later this year, d/w pt.     Elevated Cholesterol: Using medications without problems:yes Muscle aches: probably not from statin, he has some aches at baseline.  D/w pt.  Aches prev improved with change from lipitor.   Diet compliance: encouraged. He is working on Public relations account executive.   Exercise: encouraged.  Yard work, mainly.  Some recumbent bike use.   Labs d/w pt.    Intentional weight loss noted.    Hypertension:    Using medication without problems or lightheadedness: rare lightheaded but not severe and it resolves.   Chest pain with exertion:no Edema:no Short of breath:no See AVS.   Depression/mood/anxiety.  Still on BZD on baseline.  No ADE on med.  No SI/HI.  D/w pt about eventual trial of other med to supplement use, like SSRI.    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   D/w patient HY:IFOYDXA for colon cancer screening, including IFOB vs. colonoscopy.  Risks and benefits of both were discussed and patient voiced understanding.  Pt elects for: IFOB.  Prostate cancer screening and PSA options (with potential risks and benefits of testing vs not testing) were discussed along with recent recs/guidelines.  He declined testing PSA at this point. Vaccines declined.  Advance directive d/w pt.  Wife designated if patient were incapacitated.    Nystatin powder helped some but he has some leftover skin changes.    PMH and SH reviewed  Meds, vitals, and allergies reviewed.   ROS: Per HPI unless specifically indicated in ROS section   GEN: nad, alert and oriented HEENT: mucous membranes moist NECK: supple w/o LA CV: rrr. PULM: ctab, no inc wob ABD: soft, +bs EXT: no edema SKIN: no acute rash but post inflammatory changes noted on the abd wall.    Diabetic foot  exam: Normal inspection No skin breakdown No calluses  Normal DP pulses Normal sensation to light touch and monofilament Nails normal

## 2016-07-15 NOTE — Patient Instructions (Addendum)
If you keep getting lightheaded with standing, then let me know so we can look at lower your BP meds.   Take care.  Glad to see you.  Go to the lab on the way out.  We'll contact you with your lab report (stool cards). Plan on recheck in about 3 months so we can talk about meds to help the xanax work better in the long run.

## 2016-07-17 DIAGNOSIS — Z7189 Other specified counseling: Secondary | ICD-10-CM | POA: Insufficient documentation

## 2016-07-17 DIAGNOSIS — Z0000 Encounter for general adult medical examination without abnormal findings: Secondary | ICD-10-CM | POA: Insufficient documentation

## 2016-07-17 NOTE — Assessment & Plan Note (Signed)
Discussed with patient about options. Plan on continuing discussion about SSRI at next office visit. Okay for outpatient follow-up. No change in meds at this point.

## 2016-07-17 NOTE — Assessment & Plan Note (Signed)
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   D/w patient [id number] for colon cancer screening, including IFOB vs. colonoscopy.  Risks and benefits of both were discussed and patient voiced understanding.  Pt elects for: IFOB.  Prostate cancer screening and PSA options (with potential risks and benefits of testing vs not testing) were discussed along with recent recs/guidelines.  He declined testing PSA at this point. Vaccines declined.  Advance directive d/w pt.  Wife designated if patient were incapacitated.

## 2016-07-17 NOTE — Assessment & Plan Note (Signed)
A1c not at goal but stable. Improved from previous historical values. Continue work on diet. He agrees.

## 2016-07-17 NOTE — Assessment & Plan Note (Signed)
Reasonable control. If he has more episodes of lightheadedness then we can taper his medication. Discussed with patient. He agrees. Okay for outpatient follow-up.

## 2016-07-17 NOTE — Assessment & Plan Note (Signed)
Continue current statin. Continue work on diet and exercise.

## 2016-07-17 NOTE — Assessment & Plan Note (Signed)
Improved with postinflammatory hyperpigmentation changes noted.

## 2016-07-17 NOTE — Assessment & Plan Note (Signed)
Advance directive d/w pt.  Wife designated if patient were incapacitated.

## 2016-07-25 ENCOUNTER — Other Ambulatory Visit: Payer: Self-pay | Admitting: Family Medicine

## 2016-07-26 ENCOUNTER — Other Ambulatory Visit (INDEPENDENT_AMBULATORY_CARE_PROVIDER_SITE_OTHER): Payer: Medicare Other

## 2016-07-26 DIAGNOSIS — Z1211 Encounter for screening for malignant neoplasm of colon: Secondary | ICD-10-CM

## 2016-07-26 LAB — FECAL OCCULT BLOOD, IMMUNOCHEMICAL: Fecal Occult Bld: NEGATIVE

## 2016-08-01 ENCOUNTER — Encounter: Payer: Self-pay | Admitting: *Deleted

## 2016-08-30 ENCOUNTER — Other Ambulatory Visit: Payer: Self-pay | Admitting: Family Medicine

## 2016-09-01 ENCOUNTER — Ambulatory Visit: Payer: Medicare Other | Admitting: Endocrinology

## 2016-09-28 ENCOUNTER — Encounter: Payer: Self-pay | Admitting: Endocrinology

## 2016-09-28 ENCOUNTER — Ambulatory Visit (INDEPENDENT_AMBULATORY_CARE_PROVIDER_SITE_OTHER): Payer: Medicare Other | Admitting: Endocrinology

## 2016-09-28 VITALS — BP 118/66 | HR 58 | Wt 222.0 lb

## 2016-09-28 DIAGNOSIS — E118 Type 2 diabetes mellitus with unspecified complications: Secondary | ICD-10-CM | POA: Diagnosis not present

## 2016-09-28 LAB — POCT GLYCOSYLATED HEMOGLOBIN (HGB A1C): Hemoglobin A1C: 7.9

## 2016-09-28 MED ORDER — INSULIN DETEMIR 100 UNIT/ML ~~LOC~~ SOLN: 30.0000 [IU] | Freq: Every day | SUBCUTANEOUS | 5 refills | Status: DC

## 2016-09-28 MED ORDER — INSULIN LISPRO 100 UNIT/ML ~~LOC~~ SOLN: 10.0000 [IU] | Freq: Three times a day (TID) | SUBCUTANEOUS | 11 refills | Status: DC

## 2016-09-28 NOTE — Patient Instructions (Addendum)
check your blood sugar 4 times a day: before the 3 meals, and at bedtime.  also check if you have symptoms of your blood sugar being too high or too low.  please keep a record of the readings and bring it to your next appointment here (or you can bring the meter itself).  You can write it on any piece of paper.  please call us sooner if your blood sugar goes below 70, or if you have a lot of readings over 200.   Please redcuce the levemir to 30 units at bedtime.  This should enable you to take 10 units of humalog at a time.  Please come back for a follow-up appointment in 4 months.

## 2016-09-28 NOTE — Progress Notes (Signed)
Subjective:    Patient ID: Craig Koch, male    DOB: May 09, 1955, 61 y.o.   MRN: [id number]  HPI Pt returns for f/u of diabetes mellitus: DM type: Insulin-requiring type 2 Dx'ed: 8413 Complications: polyneuropathy and CAD Therapy: insulin since 2014 DKA: never Severe hypoglycemia: never Pancreatitis: never Other: he takes multiple daily injections Interval history:  pt states he feels well in general.  no cbg record, but states he has mild hypoglycemia approx twice a month.  These episodes are mild.  It is in general higher as the day goes on.  He often reduces humalog to 7 units per meal, out of fear of hypoglycemia.  Past Medical History:  Diagnosis Date  . Anxiety   . Arthritis   . Coronary artery disease   . Diabetes mellitus   . Dyslipidemia   . Hx of CABG   . Hyperlipidemia   . Hypertension     Past Surgical History:  Procedure Laterality Date  . CATARACT EXTRACTION Left 09/2015  . CORONARY ARTERY BYPASS GRAFT     (CABG with LIMA to the  LAD, SVG to OM2/OM3, SVG  to diag  . Left ankle surgery     repair of fracture  . Right lower leg surgery     rod    Social History   Social History  . Marital status: Married    Spouse name: N/A  . Number of children: N/A  . Years of education: N/A   Occupational History  . Not on file.   Social History Main Topics  . Smoking status: Current Every Day Smoker    Packs/day: 0.50    Types: Cigarettes    Last attempt to quit: 07/06/2013  . Smokeless tobacco: Former Systems developer    Types: Snuff  . Alcohol use 0.0 oz/week     Comment: occ, average 6 pack in a week  . Drug use: No  . Sexual activity: No   Other Topics Concern  . Not on file   Social History Narrative   On disability 2009 after prev injuries and CAD.     Married 1976   2 kids    Current Outpatient Prescriptions on File Prior to Visit  Medication Sig Dispense Refill  . ALPRAZolam (XANAX) 1 MG tablet TAKE 1 TABLET BY MOUTH TWICE DAILY AS NEEDED 60  tablet 2  . aspirin 325 MG tablet Take 325 mg by mouth daily.    [name] Fexofenadine HCl (ALLEGRA ALLERGY PO) Take 1 tablet by mouth daily as needed.    [name] glucose blood (ONE TOUCH ULTRA TEST) test strip 1 each by Other route 4 (four) times daily. And lancets 4/day 200 each 3  . ibuprofen (ADVIL,MOTRIN) 200 MG tablet Take 400 mg by mouth every 6 (six) hours as needed.     . insulin detemir (LEVEMIR) 100 UNIT/ML injection Inject 0.4 mLs (40 Units total) into the skin at bedtime. 10 mL 5  . insulin lispro (HUMALOG) 100 UNIT/ML injection Inject 0.15 mLs (15 Units total) into the skin 3 (three) times daily with meals. (Patient taking differently: Inject 10 Units into the skin 3 (three) times daily with meals. ) 20 mL 11  . Insulin Syringe-Needle U-100 (B-D INS SYRINGE 0.5CC/31GX5/16) 31G X 5/16" 0.5 ML MISC Use daily as directed.  Dx E11.9 100 each 3  . lisinopril (PRINIVIL,ZESTRIL) 2.5 MG tablet TAKE 1 TABLET BY MOUTH DAILY 90 tablet 2  . metFORMIN (GLUCOPHAGE) 500 MG tablet Take 1 tablet (500 mg total) by  mouth 2 (two) times daily with a meal.    . metoprolol tartrate (LOPRESSOR) 100 MG tablet TAKE 0.5 TABLETS (50 MG TOTAL) BY MOUTH 2 (TWO) TIMES DAILY. 90 tablet 1  . nystatin (MYCOSTATIN/NYSTOP) powder Apply topically 3 (three) times daily. 30 g 1  . Omega-3 Fatty Acids (FISH OIL) 1200 MG CAPS Take 2 capsules by mouth 3 (three) times daily with meals.    . rosuvastatin (CRESTOR) 10 MG tablet TAKE 1 TABLET (10 MG TOTAL) BY MOUTH DAILY. 30 tablet 11  . triamcinolone cream (KENALOG) 0.1 % Apply 1 application topically daily. 30 g 2   No current facility-administered medications on file prior to visit.     No Known Allergies  Family History  Problem Relation Age of Onset  . Heart disease Father   . Colon cancer Neg Hx   . Prostate cancer Neg Hx   . Diabetes Neg Hx     BP 118/66   Pulse (!) 58   Wt 222 lb (100.7 kg)   SpO2 98%   BMI 30.96 kg/m     Review of Systems He denies hypoglycemia     Objective:   Physical Exam VITAL SIGNS:  See vs page GENERAL: no distress Pulses: foot pulses are intact bilaterally.   MSK: no deformity of the feet or ankles.  CV: trace bilat leg edema, and bilat vv's.  Skin:  no ulcer on the feet or ankles.  normal color and temp on the feet and ankles Neuro: sensation is intact to touch on the feet and ankles, but decreased from normal.     Lab Results  Component Value Date   HGBA1C 7.9 09/28/2016      Assessment & Plan:  Insulin-requiring type 2 DM, with CAD: The pattern of his cbg's indicates he needs some adjustment in his therapy  Patient Instructions  check your blood sugar 4 times a day: before the 3 meals, and at bedtime.  also check if you have symptoms of your blood sugar being too high or too low.  please keep a record of the readings and bring it to your next appointment here (or you can bring the meter itself).  You can write it on any piece of paper.  please call us sooner if your blood sugar goes below 70, or if you have a lot of readings over 200.   Please redcuce the levemir to 30 units at bedtime.  This should enable you to take 10 units of humalog at a time.  Please come back for a follow-up appointment in 4 months.

## 2016-10-06 ENCOUNTER — Other Ambulatory Visit: Payer: Self-pay | Admitting: Family Medicine

## 2016-10-06 NOTE — Telephone Encounter (Signed)
Electronic refill request.  Alprazolam Last office visit:   07/15/16 Last Filled:    60 tablet 2 06/30/2016  Please advise.

## 2016-10-07 NOTE — Telephone Encounter (Signed)
Medication phoned to pharmacy.  

## 2016-10-07 NOTE — Telephone Encounter (Signed)
Please call in.  Thanks.   

## 2016-10-17 ENCOUNTER — Encounter: Payer: Self-pay | Admitting: Family Medicine

## 2016-10-17 ENCOUNTER — Ambulatory Visit (INDEPENDENT_AMBULATORY_CARE_PROVIDER_SITE_OTHER): Payer: Medicare Other | Admitting: Family Medicine

## 2016-10-17 VITALS — BP 110/60 | HR 62 | Temp 97.6°F | Wt 228.0 lb

## 2016-10-17 DIAGNOSIS — F411 Generalized anxiety disorder: Secondary | ICD-10-CM | POA: Diagnosis not present

## 2016-10-17 DIAGNOSIS — G8929 Other chronic pain: Secondary | ICD-10-CM

## 2016-10-17 DIAGNOSIS — M65341 Trigger finger, right ring finger: Secondary | ICD-10-CM

## 2016-10-17 DIAGNOSIS — M2550 Pain in unspecified joint: Secondary | ICD-10-CM

## 2016-10-17 DIAGNOSIS — I10 Essential (primary) hypertension: Secondary | ICD-10-CM | POA: Diagnosis not present

## 2016-10-17 MED ORDER — DULOXETINE HCL 20 MG PO CPEP: 20.0000 mg | ORAL_CAPSULE | Freq: Every day | ORAL | 3 refills | Status: DC

## 2016-10-17 NOTE — Progress Notes (Signed)
DM2 per endo.  A1c was improved. He occ tapers his mealtime insulin if he has lower sugar before a meal or smaller meal planned. Due for eye exam, d/w pt.   Still with R4th>5th pain after being kicked by a cow prev.  With prev neg imaging.  It is some better- not locking now.  He is putting up with it now and didn't want intervention.  He didn't want consideration of injection.  D/w pt.    Hypertension:    Using medication without problems or lightheadedness: yes Chest pain with exertion:no Edema:only in L ankle from arthritis at baseline.  He usually doesn't have BLE.   Short of breath:no  Anxiety.  Still on BZD at baseline.  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  He can feel overwhelmed/anxious/worried easily.  No ADE on BZD but d/w pt about eventual taper.    Meds, vitals, and allergies reviewed.   ROS: Per HPI unless specifically indicated in ROS section   GEN: nad, alert and oriented HEENT: mucous membranes moist NECK: supple w/o LA CV: rrr PULM: ctab, no inc wob ABD: soft, +bs EXT: no edema L ankle with chronic joint changes noted.

## 2016-10-17 NOTE — Patient Instructions (Signed)
Add on cymbalta 20mg  a day and see if that helps with the joint pain and see if you need less of the xanax.  Take care.  Glad to see you.  Update me as needed.   I'd like to hear how you are doing in about 2 weeks, sooner if needed.

## 2016-10-18 DIAGNOSIS — G8929 Other chronic pain: Secondary | ICD-10-CM | POA: Insufficient documentation

## 2016-10-18 DIAGNOSIS — M2550 Pain in unspecified joint: Principal | ICD-10-CM

## 2016-10-18 NOTE — Assessment & Plan Note (Addendum)
Especially in the left ankle. Cymbalta may help with joint pain. Routine cautions given. Start med and then update me. He agrees. >25 minutes spent in face to face time with patient, >50% spent in counselling or coordination of care.

## 2016-10-18 NOTE — Assessment & Plan Note (Signed)
Continue benzodiazepine for now but add on Cymbalta as this may help with anxiety and he may need benzodiazepine less in the future. Rationale discussed with patient. He agrees. He will update me. Routine cautions given on Cymbalta.

## 2016-10-18 NOTE — Assessment & Plan Note (Signed)
Improved some in the meantime. He did not want injection or further intervention at this point. Update me as needed.

## 2016-10-18 NOTE — Assessment & Plan Note (Signed)
Reasonable control. No change in meds. He agrees.

## 2016-10-28 ENCOUNTER — Telehealth: Payer: Self-pay | Admitting: Family Medicine

## 2016-10-28 NOTE — Telephone Encounter (Signed)
The joint aches did get better on the Cymbalta, not completely resolved but definitely better.  Patient is not currently taking any Xanax since being on the Cymbalta.  Patient asks if he could take 0.5 tablet at bedtime because it seems the Cymbalta wears out and he gets anxious.

## 2016-10-28 NOTE — Telephone Encounter (Signed)
Would be okay to use 1/2 xanax mg at night.  Glad cymbalta helped some. Thanks.

## 2016-10-28 NOTE — Telephone Encounter (Signed)
Copied from [place] #[id number]. Topic: Quick Communication - See Telephone Encounter >> Oct 28, 2016 10:39 AM Ether Griffins B wrote: CRM for notification. See Telephone encounter for:  10/28/16. New med status update, at first had trouble sleeping at night and appetite decreased. But after 1st week that has subsided and pt states it seems to be working alright. Would also like to know if having trouble sleeping would it be ok to take half a mg of xanax before going to sleep to help.

## 2016-10-28 NOTE — Telephone Encounter (Signed)
Check with patient.  Did the joint aches get any better on cymbalta?  Let me know.   How much xanax is he taking?  If taking 2 mg total in a day, I wouldn't go above that dose.   If he was able to cut back to 1.5mg  in a day for less, then okay to use an extra 0.5mg  at night.  Thanks.

## 2016-10-28 NOTE — Telephone Encounter (Signed)
Patient advised.

## 2017-01-21 ENCOUNTER — Other Ambulatory Visit: Payer: Self-pay | Admitting: Family Medicine

## 2017-01-27 ENCOUNTER — Ambulatory Visit: Payer: Medicare Other | Admitting: Endocrinology

## 2017-01-28 ENCOUNTER — Other Ambulatory Visit: Payer: Self-pay | Admitting: Endocrinology

## 2017-01-31 ENCOUNTER — Encounter: Payer: Self-pay | Admitting: Endocrinology

## 2017-01-31 ENCOUNTER — Ambulatory Visit: Payer: Medicare Other | Admitting: Endocrinology

## 2017-01-31 VITALS — BP 120/62 | HR 69 | Wt 219.8 lb

## 2017-01-31 DIAGNOSIS — E118 Type 2 diabetes mellitus with unspecified complications: Secondary | ICD-10-CM

## 2017-01-31 LAB — POCT GLYCOSYLATED HEMOGLOBIN (HGB A1C): Hemoglobin A1C: 8.9

## 2017-01-31 MED ORDER — INSULIN LISPRO 100 UNIT/ML ~~LOC~~ SOLN: 15.0000 [IU] | Freq: Three times a day (TID) | SUBCUTANEOUS | 11 refills | Status: DC

## 2017-01-31 NOTE — Patient Instructions (Addendum)
check your blood sugar 4 times a day: before the 3 meals, and at bedtime.  also check if you have symptoms of your blood sugar being too high or too low.  please keep a record of the readings and bring it to your next appointment here (or you can bring the meter itself).  You can write it on any piece of paper.  please call us sooner if your blood sugar goes below 70, or if you have a lot of readings over 200.   Please continue the same levemir: 30 units at bedtime, and:  Increase humalog to 15 units 3 times a day (just before each meal).   Please come back for a follow-up appointment in 2 months.

## 2017-01-31 NOTE — Progress Notes (Signed)
Subjective:    Patient ID: Craig Koch, male    DOB: [birth year], 62 y.o.   MRN: [id number]  HPI Pt returns for f/u of diabetes mellitus: DM type: Insulin-requiring type 2 Dx'ed: 5852 Complications: polyneuropathy and CAD Therapy: insulin since 2014 DKA: never Severe hypoglycemia: never Pancreatitis: never Other: he takes multiple daily injections. Interval history:  pt states he feels well in general.  no cbg record, but states cbg varies from 80-200's.  It is in general higher as the day goes on. He sometimes takes extra levemir, due to high cbg at bedtime  Past Medical History:  Diagnosis Date  . Anxiety   . Arthritis   . Coronary artery disease   . Diabetes mellitus   . Dyslipidemia   . Hx of CABG   . Hyperlipidemia   . Hypertension     Past Surgical History:  Procedure Laterality Date  . CATARACT EXTRACTION Left 09/2015  . CORONARY ARTERY BYPASS GRAFT     (CABG with LIMA to the  LAD, SVG to OM2/OM3, SVG  to diag  . Left ankle surgery     repair of fracture  . Right lower leg surgery     rod    Social History   Socioeconomic History  . Marital status: Married    Spouse name: Not on file  . Number of children: Not on file  . Years of education: Not on file  . Highest education level: Not on file  Social Needs  . Financial resource strain: Not on file  . Food insecurity - worry: Not on file  . Food insecurity - inability: Not on file  . Transportation needs - medical: Not on file  . Transportation needs - non-medical: Not on file  Occupational History  . Not on file  Tobacco Use  . Smoking status: Current Every Day Smoker    Packs/day: 0.50    Types: Cigarettes    Last attempt to quit: 07/06/2013    Years since quitting: 3.5  . Smokeless tobacco: Former Systems developer    Types: Snuff  Substance and Sexual Activity  . Alcohol use: Yes    Alcohol/week: 0.0 oz    Comment: occ, average 6 pack in a week  . Drug use: No  . Sexual activity: No  Other Topics  Concern  . Not on file  Social History Narrative   On disability 2009 after prev injuries and CAD.     Married 1976   2 kids    Current Outpatient Medications on File Prior to Visit  Medication Sig Dispense Refill  . ALPRAZolam (XANAX) 1 MG tablet TAKE 1 TABLET BY MOUTH TWICE DAILY AS NEEDED 60 tablet 1  . aspirin 325 MG tablet Take 325 mg by mouth daily.    . DULoxetine (CYMBALTA) 20 MG capsule Take 1 capsule (20 mg total) by mouth daily. 30 capsule 3  . Fexofenadine HCl (ALLEGRA ALLERGY PO) Take 1 tablet by mouth daily as needed.    [name] ibuprofen (ADVIL,MOTRIN) 200 MG tablet Take 400 mg by mouth every 6 (six) hours as needed.     . insulin detemir (LEVEMIR) 100 UNIT/ML injection Inject 0.3 mLs (30 Units total) into the skin at bedtime. 10 mL 5  . Insulin Syringe-Needle U-100 (B-D INS SYRINGE 0.5CC/31GX5/16) 31G X 5/16" 0.5 ML MISC Use daily as directed.  Dx E11.9 100 each 3  . lisinopril (PRINIVIL,ZESTRIL) 2.5 MG tablet TAKE 1 TABLET BY MOUTH DAILY 90 tablet 2  . metFORMIN (GLUCOPHAGE)  500 MG tablet Take 1 tablet (500 mg total) by mouth 2 (two) times daily with a meal.    . metoprolol tartrate (LOPRESSOR) 100 MG tablet TAKE 0.5 TABLETS (50 MG TOTAL) BY MOUTH 2 (TWO) TIMES DAILY. 90 tablet 1  . nystatin (MYCOSTATIN/NYSTOP) powder Apply topically 3 (three) times daily. 30 g 1  . Omega-3 Fatty Acids (FISH OIL) 1200 MG CAPS Take 2 capsules by mouth 3 (three) times daily with meals.    . ONE TOUCH ULTRA TEST test strip TEST 4 (FOUR) TIMES DAILY. 360 each 3  . rosuvastatin (CRESTOR) 10 MG tablet TAKE 1 TABLET (10 MG TOTAL) BY MOUTH DAILY. 30 tablet 11  . triamcinolone cream (KENALOG) 0.1 % Apply 1 application topically daily. 30 g 2   No current facility-administered medications on file prior to visit.     No Known Allergies  Family History  Problem Relation Age of Onset  . Heart disease Father   . Colon cancer Neg Hx   . Prostate cancer Neg Hx   . Diabetes Neg Hx     BP 120/62 (BP  Location: Left Arm, Patient Position: Sitting, Cuff Size: Normal)   Pulse 69   Wt 219 lb 12.8 oz (99.7 kg)   SpO2 96%   BMI 30.66 kg/m    Review of Systems He denies hypoglycemia.      Objective:   Physical Exam VITAL SIGNS:  See vs page GENERAL: no distress Pulses: foot pulses are intact bilaterally.   MSK: no deformity of the feet or ankles.  CV: trace bilat leg edema, and bilat vv's.  Skin:  no ulcer on the feet or ankles.  normal color and temp on the feet and ankles Neuro: sensation is intact to touch on the feet and ankles, but decreased from normal.     Lab Results  Component Value Date   HGBA1C 8.9 01/31/2017      Assessment & Plan:  Insulin-requiring type 2 DM: worse. Noncompliance with cbg recording and insulin: we discussed the need to take only as rx'ed.   Patient Instructions  check your blood sugar 4 times a day: before the 3 meals, and at bedtime.  also check if you have symptoms of your blood sugar being too high or too low.  please keep a record of the readings and bring it to your next appointment here (or you can bring the meter itself).  You can write it on any piece of paper.  please call us sooner if your blood sugar goes below 70, or if you have a lot of readings over 200.   Please continue the same levemir: 30 units at bedtime, and:  Increase humalog to 15 units 3 times a day (just before each meal).   Please come back for a follow-up appointment in 2 months.

## 2017-02-04 ENCOUNTER — Other Ambulatory Visit: Payer: Self-pay | Admitting: Family Medicine

## 2017-02-06 ENCOUNTER — Other Ambulatory Visit: Payer: Self-pay | Admitting: Family Medicine

## 2017-02-07 ENCOUNTER — Other Ambulatory Visit: Payer: Self-pay | Admitting: *Deleted

## 2017-02-07 MED ORDER — DULOXETINE HCL 20 MG PO CPEP: 20.0000 mg | ORAL_CAPSULE | Freq: Every day | ORAL | 3 refills | Status: DC

## 2017-03-13 ENCOUNTER — Other Ambulatory Visit: Payer: Self-pay | Admitting: *Deleted

## 2017-03-13 MED ORDER — METOPROLOL TARTRATE 100 MG PO TABS: 50.0000 mg | ORAL_TABLET | Freq: Two times a day (BID) | ORAL | 0 refills | Status: DC

## 2017-03-27 ENCOUNTER — Other Ambulatory Visit: Payer: Self-pay | Admitting: Family Medicine

## 2017-03-30 ENCOUNTER — Ambulatory Visit: Payer: Medicare Other | Admitting: Endocrinology

## 2017-06-04 ENCOUNTER — Other Ambulatory Visit: Payer: Self-pay | Admitting: Family Medicine

## 2017-06-19 ENCOUNTER — Other Ambulatory Visit: Payer: Self-pay | Admitting: Family Medicine

## 2017-07-11 ENCOUNTER — Other Ambulatory Visit: Payer: Self-pay | Admitting: Family Medicine

## 2017-07-11 DIAGNOSIS — E118 Type 2 diabetes mellitus with unspecified complications: Secondary | ICD-10-CM

## 2017-07-12 ENCOUNTER — Ambulatory Visit: Payer: Medicare Other

## 2017-07-12 ENCOUNTER — Ambulatory Visit (INDEPENDENT_AMBULATORY_CARE_PROVIDER_SITE_OTHER): Payer: Medicare Other

## 2017-07-12 VITALS — BP 110/70 | HR 62 | Temp 97.9°F | Ht 72.5 in | Wt 225.5 lb

## 2017-07-12 DIAGNOSIS — E118 Type 2 diabetes mellitus with unspecified complications: Secondary | ICD-10-CM

## 2017-07-12 DIAGNOSIS — Z0000 Encounter for general adult medical examination without abnormal findings: Secondary | ICD-10-CM | POA: Diagnosis not present

## 2017-07-12 LAB — LIPID PANEL
Cholesterol: 148 mg/dL (ref 0–200)
HDL: 60 mg/dL (ref >39.00)
LDL Cholesterol: 72 mg/dL (ref 0–99)
NonHDL: 88.26
Total CHOL/HDL Ratio: 2
Triglycerides: 82 mg/dL (ref 0.0–149.0)
VLDL: 16.4 mg/dL (ref 0.0–40.0)

## 2017-07-12 LAB — COMPREHENSIVE METABOLIC PANEL
ALT: 21 U/L (ref 0–53)
AST: 23 U/L (ref 0–37)
Albumin: 4.3 g/dL (ref 3.5–5.2)
Alkaline Phosphatase: 72 U/L (ref 39–117)
BUN: 24 mg/dL — ABNORMAL HIGH (ref 6–23)
CO2: 32 mEq/L (ref 19–32)
Calcium: 9.5 mg/dL (ref 8.4–10.5)
Chloride: 97 mEq/L (ref 96–112)
Creatinine, Ser: 0.95 mg/dL (ref 0.40–1.50)
GFR: 85.29 mL/min (ref >60.00)
Glucose, Bld: 226 mg/dL — ABNORMAL HIGH (ref 70–99)
Potassium: 4.9 mEq/L (ref 3.5–5.1)
Sodium: 135 mEq/L (ref 135–145)
Total Bilirubin: 0.6 mg/dL (ref 0.2–1.2)
Total Protein: 7.4 g/dL (ref 6.0–8.3)

## 2017-07-12 LAB — HEMOGLOBIN A1C: Hgb A1c MFr Bld: 9 % — ABNORMAL HIGH (ref 4.6–6.5)

## 2017-07-12 NOTE — Progress Notes (Signed)
Subjective:   Craig Koch is a 62 y.o. male who presents for Medicare Annual/Subsequent preventive examination.  Review of Systems:  N/A Cardiac Risk Factors include: advanced age (>39men, >52 women);diabetes mellitus;dyslipidemia;hypertension;male gender;smoking/ tobacco exposure;obesity (BMI >30kg/m2)     Objective:    Vitals: BP 110/70 (BP Location: Right Arm, Patient Position: Sitting, Cuff Size: Normal)   Pulse 62   Temp 97.9 F (36.6 C) (Oral)   Ht 6' 0.5" (1.842 m) Comment: shoes  Wt 225 lb 8 oz (102.3 kg)   SpO2 98%   BMI 30.16 kg/m   Body mass index is 30.16 kg/m.  Advanced Directives 07/12/2017 07/11/2016 03/08/2016 03/16/2015  Does Patient Have a Medical Advance Directive? No No No No  Does patient want to make changes to medical advance directive? - No - Patient declined - -  Would patient like information on creating a medical advance directive? Yes (MAU/Ambulatory/Procedural Areas - Information given) - No - Patient declined Yes - Educational materials given    Tobacco Social History   Tobacco Use  Smoking Status Current Every Day Smoker  . Packs/day: 0.50  . Types: Cigarettes  . Last attempt to quit: 07/06/2013  . Years since quitting: 4.0  Smokeless Tobacco Former Systems developer  . Types: Snuff     Ready to quit: No Counseling given: No   Clinical Intake:  Pre-visit preparation completed: Yes  Pain : 0-10 Pain Score: 4  Pain Type: Chronic pain Pain Location: Back Pain Orientation: Lower     Nutritional Status: BMI > 30  Obese Nutritional Risks: None Diabetes: Yes CBG done?: No Did pt. bring in CBG monitor from home?: No  How often do you need to have someone help you when you read instructions, pamphlets, or other written materials from your doctor or pharmacy?: 1 - Never What is the last grade level you completed in school?: 9th grade  Interpreter Needed?: No  Comments: pt lives with spouse Information entered by :: LPinson, LPN  Past  Medical History:  Diagnosis Date  . Anxiety   . Arthritis   . Coronary artery disease   . Diabetes mellitus   . Dyslipidemia   . Hx of CABG   . Hyperlipidemia   . Hypertension    Past Surgical History:  Procedure Laterality Date  . CATARACT EXTRACTION Left 09/2015  . CORONARY ARTERY BYPASS GRAFT     (CABG with LIMA to the  LAD, SVG to OM2/OM3, SVG  to diag  . Left ankle surgery     repair of fracture  . Right lower leg surgery     rod   Family History  Problem Relation Age of Onset  . Heart disease Father   . Colon cancer Neg Hx   . Prostate cancer Neg Hx   . Diabetes Neg Hx    Social History   Socioeconomic History  . Marital status: Married    Spouse name: Not on file  . Number of children: Not on file  . Years of education: Not on file  . Highest education level: Not on file  Occupational History  . Not on file  Social Needs  . Financial resource strain: Not on file  . Food insecurity:    Worry: Not on file    Inability: Not on file  . Transportation needs:    Medical: Not on file    Non-medical: Not on file  Tobacco Use  . Smoking status: Current Every Day Smoker    Packs/day: 0.50  Types: Cigarettes    Last attempt to quit: 07/06/2013    Years since quitting: 4.0  . Smokeless tobacco: Former Systems developer    Types: Snuff  Substance and Sexual Activity  . Alcohol use: Yes    Alcohol/week: 3.6 oz    Types: 6 Cans of beer per week    Comment: occ, average 6 pack in a week  . Drug use: No  . Sexual activity: Never  Lifestyle  . Physical activity:    Days per week: Not on file    Minutes per session: Not on file  . Stress: Not on file  Relationships  . Social connections:    Talks on phone: Not on file    Gets together: Not on file    Attends religious service: Not on file    Active member of club or organization: Not on file    Attends meetings of clubs or organizations: Not on file    Relationship status: Not on file  Other Topics Concern  . Not on  file  Social History Narrative   On disability 2009 after prev injuries and CAD.     Married 1976   2 kids    Outpatient Encounter Medications as of 07/12/2017  Medication Sig  . aspirin 325 MG tablet Take 325 mg by mouth daily.  . BD INSULIN SYRINGE U/F 31G X 5/16" 0.5 ML MISC USE DAILY AS DIRECTED. DX E11.9  . DULoxetine (CYMBALTA) 20 MG capsule TAKE 1 CAPSULE BY MOUTH EVERY DAY  . Fexofenadine HCl (ALLEGRA ALLERGY PO) Take 1 tablet by mouth daily as needed.  [name] ibuprofen (ADVIL,MOTRIN) 200 MG tablet Take 400 mg by mouth every 6 (six) hours as needed.   . insulin detemir (LEVEMIR) 100 UNIT/ML injection Inject 0.3 mLs (30 Units total) into the skin at bedtime.  . insulin lispro (HUMALOG) 100 UNIT/ML injection Inject 0.15 mLs (15 Units total) into the skin 3 (three) times daily with meals.  [name] lisinopril (PRINIVIL,ZESTRIL) 2.5 MG tablet TAKE 1 TABLET BY MOUTH DAILY  . metFORMIN (GLUCOPHAGE) 500 MG tablet TAKE 1 TABLET (500 MG TOTAL) BY MOUTH 2 (TWO) TIMES DAILY WITH A MEAL.  . metoprolol tartrate (LOPRESSOR) 100 MG tablet TAKE 0.5 TABLETS (50 MG TOTAL) BY MOUTH 2 (TWO) TIMES DAILY.  [name] Omega-3 Fatty Acids (FISH OIL) 1200 MG CAPS Take 2 capsules by mouth 3 (three) times daily with meals.  . ONE TOUCH ULTRA TEST test strip TEST 4 (FOUR) TIMES DAILY.  . rosuvastatin (CRESTOR) 10 MG tablet TAKE 1 TABLET (10 MG TOTAL) BY MOUTH DAILY.  . [DISCONTINUED] ALPRAZolam (XANAX) 1 MG tablet TAKE 1 TABLET BY MOUTH TWICE DAILY AS NEEDED  . [DISCONTINUED] nystatin (MYCOSTATIN/NYSTOP) powder Apply topically 3 (three) times daily.  . [DISCONTINUED] triamcinolone cream (KENALOG) 0.1 % Apply 1 application topically daily.   No facility-administered encounter medications on file as of 07/12/2017.     Activities of Daily Living In your present state of health, do you have any difficulty performing the following activities: 07/12/2017  Hearing? Y  Vision? N  Difficulty concentrating or making decisions? N    Walking or climbing stairs? N  Dressing or bathing? N  Doing errands, shopping? N  Preparing Food and eating ? N  Using the Toilet? N  In the past six months, have you accidently leaked urine? N  Do you have problems with loss of bowel control? N  Managing your Medications? N  Managing your Finances? N  Housekeeping or managing your Housekeeping? N  Some recent data might be hidden    Patient Care Team: Tonia Ghent, MD as PCP - General (Family Medicine) Thelma Comp, OD (Optometry)   Assessment:   This is a routine wellness examination for [facility].   Hearing Screening   125Hz  250Hz  500Hz  1000Hz  2000Hz  3000Hz  4000Hz  6000Hz  8000Hz   Right ear:   40 40 40  40    Left ear:   40 40 40  0      Visual Acuity Screening   Right eye Left eye Both eyes  Without correction: 20/25 20/20-2 20/25  With correction:        Exercise Activities and Dietary recommendations Current Exercise Habits: Home exercise routine, Type of exercise: Other - see comments(stationary bike), Time (Minutes): 15, Frequency (Times/Week): 7, Weekly Exercise (Minutes/Week): 105, Intensity: Mild, Exercise limited by: orthopedic condition(s)  Goals    . Reduce fat intake     Starting 07/12/2017, I will continue to decrease carb and fat intake in an effort to decrease A1C.        Fall Risk Fall Risk  07/12/2017 07/11/2016 03/25/2016 03/08/2016 03/16/2015  Falls in the past year? No No No No Yes  Number falls in past yr: - - - - 1  Injury with Fall? - - - - No  Risk for fall due to : - - - - Impaired balance/gait  Follow up - - - - Falls evaluation completed    Depression Screen PHQ 2/9 Scores 07/12/2017 07/11/2016 03/08/2016 03/16/2015  PHQ - 2 Score 0 2 0 0  PHQ- 9 Score 0 6 - -    Cognitive Function MMSE - Mini Mental State Exam 07/12/2017 07/11/2016 03/16/2015  Orientation to time 5 5 5   Orientation to Place 5 5 5   Registration 3 3 3   Attention/ Calculation 0 0 5  Recall 3 3 3   Language- name 2 objects 0  0 0  Language- repeat 1 1 1   Language- follow 3 step command 3 3 3   Language- read & follow direction 0 0 1  Write a sentence 0 0 0  Copy design 0 0 0  Total score 20 20 26        PLEASE NOTE: A Mini-Cog screen was completed. Maximum score is 20. A value of 0 denotes this part of Folstein MMSE was not completed or the patient failed this part of the Mini-Cog screening.   Mini-Cog Screening Orientation to Time - Max 5 pts Orientation to Place - Max 5 pts Registration - Max 3 pts Recall - Max 3 pts Language Repeat - Max 1 pts Language Follow 3 Step Command - Max 3 pts  Screening Tests Health Maintenance  Topic Date Due  . OPHTHALMOLOGY EXAM  08/03/2018 (Originally 06/03/2016)  . PNEUMOCOCCAL POLYSACCHARIDE VACCINE (1) 03/14/2020 (Originally 03/16/1957)  . COLONOSCOPY  03/15/2025 (Originally 03/16/2005)  . COLON CANCER SCREENING ANNUAL FOBT  07/26/2017  . INFLUENZA VACCINE  08/03/2017  . HEMOGLOBIN A1C  01/12/2018  . FOOT EXAM  01/31/2018  . TETANUS/TDAP  03/03/2020  . Hepatitis C Screening  Completed  . HIV Screening  Completed       Plan:     I have personally reviewed, addressed, and noted the following in the patient's chart:  A. Medical and social history B. Use of alcohol, tobacco or illicit drugs  C. Current medications and supplements D. Functional ability and status E.  Nutritional status F.  Physical activity G. Advance directives H. List of other physicians I.  Hospitalizations, surgeries,  and ER visits in previous 12 months J.  Bray to include hearing, vision, cognitive, depression L. Referrals and appointments - none  In addition, I have reviewed and discussed with patient certain preventive protocols, quality metrics, and best practice recommendations. A written personalized care plan for preventive services as well as general preventive health recommendations were provided to patient.  See attached scanned questionnaire for additional  information.   Signed,   Lindell Noe, MHA, BS, LPN Health Coach

## 2017-07-12 NOTE — Progress Notes (Signed)
   Subjective:    Patient ID: Craig Koch, male    DOB: [birth year], 62 y.o.   MRN: [id number]  HPI  I reviewed health advisor's note, was available for consultation, and agree with documentation and plan.   Review of Systems     Objective:   Physical Exam         Assessment & Plan:

## 2017-07-12 NOTE — Patient Instructions (Signed)
Craig Koch , Thank you for taking time to come for your Medicare Wellness Visit. I appreciate your ongoing commitment to your health goals. Please review the following plan we discussed and let me know if I can assist you in the future.   These are the goals we discussed: Goals    . Reduce fat intake     Starting 07/12/2017, I will continue to decrease carb and fat intake in an effort to decrease A1C.        This is a list of the screening recommended for you and due dates:  Health Maintenance  Topic Date Due  . Eye exam for diabetics  08/03/2018*  . Pneumococcal vaccine (1) 03/14/2020*  . Colon Cancer Screening  03/15/2025*  . Stool Blood Test  07/26/2017  . Flu Shot  08/03/2017  . Hemoglobin A1C  01/12/2018  . Complete foot exam   01/31/2018  . Tetanus Vaccine  03/03/2020  .  Hepatitis C: One time screening is recommended by Center for Disease Control  (CDC) for  adults born from [birth year] through 1965.   Completed  . HIV Screening  Completed  *Topic was postponed. The date shown is not the original due date.   Preventive Care for Adults  A healthy lifestyle and preventive care can promote health and wellness. Preventive health guidelines for adults include the following key practices.  . A routine yearly physical is a good way to check with your health care provider about your health and preventive screening. It is a chance to share any concerns and updates on your health and to receive a thorough exam.  . Visit your dentist for a routine exam and preventive care every 6 months. Brush your teeth twice a day and floss once a day. Good oral hygiene prevents tooth decay and gum disease.  . The frequency of eye exams is based on your age, health, family medical history, use  of contact lenses, and other factors. Follow your health care provider's recommendations for frequency of eye exams.  . Eat a healthy diet. Foods like vegetables, fruits, whole grains, low-fat dairy products,  and lean protein foods contain the nutrients you need without too many calories. Decrease your intake of foods high in solid fats, added sugars, and salt. Eat the right amount of calories for you. Get information about a proper diet from your health care provider, if necessary.  . Regular physical exercise is one of the most important things you can do for your health. Most adults should get at least 150 minutes of moderate-intensity exercise (any activity that increases your heart rate and causes you to sweat) each week. In addition, most adults need muscle-strengthening exercises on 2 or more days a week.  Silver Sneakers may be a benefit available to you. To determine eligibility, you may visit the website: www.silversneakers.com or contact program at 747-061-4084 Mon-Fri between 8AM-8PM.   . Maintain a healthy weight. The body mass index (BMI) is a screening tool to identify possible weight problems. It provides an estimate of body fat based on height and weight. Your health care provider can find your BMI and can help you achieve or maintain a healthy weight.   For adults 20 years and older: ? A BMI below 18.5 is considered underweight. ? A BMI of 18.5 to 24.9 is normal. ? A BMI of 25 to 29.9 is considered overweight. ? A BMI of 30 and above is considered obese.   . Maintain normal blood lipids and cholesterol  levels by exercising and minimizing your intake of saturated fat. Eat a balanced diet with plenty of fruit and vegetables. Blood tests for lipids and cholesterol should begin at age 53 and be repeated every 5 years. If your lipid or cholesterol levels are high, you are over 50, or you are at high risk for heart disease, you may need your cholesterol levels checked more frequently. Ongoing high lipid and cholesterol levels should be treated with medicines if diet and exercise are not working.  . If you smoke, find out from your health care provider how to quit. If you do not use tobacco,  please do not start.  . If you choose to drink alcohol, please do not consume more than 2 drinks per day. One drink is considered to be 12 ounces (355 mL) of beer, 5 ounces (148 mL) of wine, or 1.5 ounces (44 mL) of liquor.  . If you are 52-44 years old, ask your health care provider if you should take aspirin to prevent strokes.  . Use sunscreen. Apply sunscreen liberally and repeatedly throughout the day. You should seek shade when your shadow is shorter than you. Protect yourself by wearing long sleeves, pants, a wide-brimmed hat, and sunglasses year round, whenever you are outdoors.  . Once a month, do a whole body skin exam, using a mirror to look at the skin on your back. Tell your health care provider of new moles, moles that have irregular borders, moles that are larger than a pencil eraser, or moles that have changed in shape or color.

## 2017-07-12 NOTE — Progress Notes (Signed)
PCP notes:   Health maintenance:  Eye exam - addressed FOBT - kit provided to patient A1C - completed  Abnormal screenings:   Hearing - failed  Hearing Screening   _0  _1  _2  _3  _4  _5  _6  _7  _8   Right ear:   40 40 40  40    Left ear:   40 40 40  0     Patient concerns:   None  Nurse concerns:  None  Next PCP appt:   07/17/17 @ 1045

## 2017-07-17 ENCOUNTER — Ambulatory Visit: Payer: Medicare Other | Admitting: Family Medicine

## 2017-07-17 ENCOUNTER — Encounter: Payer: Self-pay | Admitting: Family Medicine

## 2017-07-17 VITALS — BP 110/70 | HR 62 | Temp 97.9°F | Ht 72.5 in | Wt 225.5 lb

## 2017-07-17 DIAGNOSIS — I2510 Atherosclerotic heart disease of native coronary artery without angina pectoris: Secondary | ICD-10-CM

## 2017-07-17 DIAGNOSIS — Z7189 Other specified counseling: Secondary | ICD-10-CM

## 2017-07-17 DIAGNOSIS — G8929 Other chronic pain: Secondary | ICD-10-CM | POA: Diagnosis not present

## 2017-07-17 DIAGNOSIS — F411 Generalized anxiety disorder: Secondary | ICD-10-CM

## 2017-07-17 DIAGNOSIS — E785 Hyperlipidemia, unspecified: Secondary | ICD-10-CM

## 2017-07-17 DIAGNOSIS — I10 Essential (primary) hypertension: Secondary | ICD-10-CM

## 2017-07-17 DIAGNOSIS — Z0000 Encounter for general adult medical examination without abnormal findings: Secondary | ICD-10-CM

## 2017-07-17 DIAGNOSIS — E118 Type 2 diabetes mellitus with unspecified complications: Secondary | ICD-10-CM

## 2017-07-17 DIAGNOSIS — M2550 Pain in unspecified joint: Secondary | ICD-10-CM | POA: Diagnosis not present

## 2017-07-17 MED ORDER — DULOXETINE HCL 20 MG PO CPEP: ORAL_CAPSULE | ORAL | 3 refills | Status: DC

## 2017-07-17 MED ORDER — ALPRAZOLAM 0.5 MG PO TABS: 0.5000 mg | ORAL_TABLET | Freq: Every day | ORAL | 2 refills | Status: DC | PRN

## 2017-07-17 MED ORDER — ALPRAZOLAM 0.5 MG PO TABS: 1.0000 mg | ORAL_TABLET | Freq: Every day | ORAL | 2 refills | Status: DC | PRN

## 2017-07-17 NOTE — Progress Notes (Signed)
Diabetes:  Using medications without difficulties: yes Hypoglycemic episodes: rare- in the AMs on waking, cautions d/w pt.  Hyperglycemic episodes:no Feet problems: some dec in sensation but no pain Blood Sugars averaging: usually ~ 150 usually, see above.   eye exam within last year: due, d/w pt.  A1c d/w pt.   He is using sliding scales with meals along with levemir at night.   He is still seeing endocrine.  See avs.    Cymbalta helped some with pain (less shoulder pain) and mood (depression is better, taking less xanax now).  No ADE on cymbalta.  He ruminates on the days events later in the day and he used a lower dose of xanax at that point with some relief.  Initial Xanax prescription was printed incorrectly and avoided that out and shredded the paper copy.  Reprinted correctly and given the patient.  Routine benzodiazepine cautions discussed with patient.  Smoking 1/2 ppd, he is considering tapering- d/w pt.  Encouraged cessation/taper.     Hypertension:   Using medication without problems or lightheadedness: yes Chest pain with exertion:no Edema:no Short of breath:no  Elevated Cholesterol: Using medications without problems:yes Muscle aches: likely not from statin, "more likely from old age."   Diet compliance: encouraged Exercise: encouraged He tolerated crestor better than lipitor, had a lot of aches with lipitor.   D/w patient MB:TDHRCBU for colon cancer screening, including IFOB vs. colonoscopy.  Risks and benefits of both were discussed and patient voiced understanding.  Pt elects for: IFOB, he has the kit, pending. D/w pt.   Prostate cancer screening and PSA options (with potential risks and benefits of testing vs not testing) were discussed along with recent recs/guidelines.  He declined testing PSA at this point. Advance directive d/w pt.  Wife designated if patient were incapacitated.   He declined routine vaccination.  D/w pt.  He doesn't have a medical reason not to  get vaccinated.  In that sense he is noncomplaint re: vaccination.   Hearing screen d/w pt, declined hearing aids.  He has B cerumen, he'll try home kit.  D/w pt.   PMH and SH reviewed  Meds, vitals, and allergies reviewed.   ROS: Per HPI unless specifically indicated in ROS section   GEN: nad, alert and oriented HEENT: mucous membranes moist, B cerumen noted.   NECK: supple w/o LA CV: rrr. PULM: ctab, no inc wob ABD: soft, +bs EXT: no edema SKIN: no acute rash  Diabetic foot exam: Normal inspection No skin breakdown Small calluses on medial B 1st toes Normal DP pulses Normal sensation to light touch and monofilament Nails normal

## 2017-07-17 NOTE — Patient Instructions (Addendum)
Try to irrigate your ears at home and that should remove the wax.   Call about an eye check and an appointment with Dr. Loanne Drilling.   We will call about your referral.  Rosaria Ferries or Azalee Course will call you if you Nevan't see one of them on the way out.  Keep using the cymbalta and use the xanax only if needed.  Update me as needed.  Take care.  Glad to see you.

## 2017-07-19 NOTE — Assessment & Plan Note (Signed)
Blood Sugars averaging: usually ~ 150 usually, see above.   eye exam within last year: due, d/w pt.  A1c d/w pt.   I do not want to induce hypoglycemia.  Discussed with patient.  No change in meds at this point.  I want him to follow with endocrinology.  I will route this note to endocrine.  I appreciate the help of all involved.

## 2017-07-19 NOTE — Assessment & Plan Note (Signed)
He tolerated crestor better than lipitor, had a lot of aches with lipitor.  Labs discussed with patient.  Continue as is.  He agrees.

## 2017-07-19 NOTE — Assessment & Plan Note (Signed)
D/w patient [id number] for colon cancer screening, including IFOB vs. colonoscopy.  Risks and benefits of both were discussed and patient voiced understanding.  Pt elects for: IFOB, he has the kit, pending. D/w pt.   Prostate cancer screening and PSA options (with potential risks and benefits of testing vs not testing) were discussed along with recent recs/guidelines.  He declined testing PSA at this point. Advance directive d/w pt.  Wife designated if patient were incapacitated.   He declined routine vaccination.  D/w pt.  He doesn't have a medical reason not to get vaccinated.  In that sense he is noncomplaint re: vaccination.   Hearing screen d/w pt, declined hearing aids.  He has B cerumen, he'll try home kit.  D/w pt.

## 2017-07-19 NOTE — Assessment & Plan Note (Signed)
Cymbalta helped some with pain (less shoulder pain) and mood (depression is better, taking less xanax now).  No ADE on cymbalta.  He ruminates on the days events later in the day and he used a lower dose of xanax at that point with some relief.  Initial Xanax prescription was printed incorrectly and avoided that out and shredded the paper copy.  Reprinted correctly and given the patient.  No suicidal homicidal intent.  Continue Cymbalta as is.  Okay to continue with the lower dose of as needed Xanax as needed later in the day.  He will update me as needed.

## 2017-07-19 NOTE — Assessment & Plan Note (Signed)
Reasonable control.  No change in meds.  Labs discussed with patient.  Continue work on diet and weight.  All questions answered. >25 minutes spent in face to face time with patient, >50% spent in counselling or coordination of care.

## 2017-07-19 NOTE — Assessment & Plan Note (Signed)
Advance directive d/w pt.  Wife designated if patient were incapacitated.

## 2017-07-19 NOTE — Assessment & Plan Note (Signed)
Improved with Cymbalta.  Continue as is.  He agrees.  He was happy that his joint aches were a lot better on the medication.

## 2017-08-07 ENCOUNTER — Other Ambulatory Visit: Payer: Self-pay | Admitting: Family Medicine

## 2017-08-07 ENCOUNTER — Other Ambulatory Visit (INDEPENDENT_AMBULATORY_CARE_PROVIDER_SITE_OTHER): Payer: Medicare Other

## 2017-08-07 DIAGNOSIS — Z1211 Encounter for screening for malignant neoplasm of colon: Secondary | ICD-10-CM

## 2017-08-07 LAB — FECAL OCCULT BLOOD, IMMUNOCHEMICAL: Fecal Occult Bld: NEGATIVE

## 2017-08-09 ENCOUNTER — Encounter: Payer: Self-pay | Admitting: *Deleted

## 2017-09-06 DIAGNOSIS — H2511 Age-related nuclear cataract, right eye: Secondary | ICD-10-CM | POA: Diagnosis not present

## 2017-09-06 DIAGNOSIS — H5201 Hypermetropia, right eye: Secondary | ICD-10-CM | POA: Diagnosis not present

## 2017-09-06 DIAGNOSIS — E113293 Type 2 diabetes mellitus with mild nonproliferative diabetic retinopathy without macular edema, bilateral: Secondary | ICD-10-CM | POA: Diagnosis not present

## 2017-09-06 DIAGNOSIS — Z9842 Cataract extraction status, left eye: Secondary | ICD-10-CM | POA: Diagnosis not present

## 2017-09-06 DIAGNOSIS — H25041 Posterior subcapsular polar age-related cataract, right eye: Secondary | ICD-10-CM | POA: Diagnosis not present

## 2017-09-06 LAB — HM DIABETES EYE EXAM

## 2017-09-15 ENCOUNTER — Other Ambulatory Visit: Payer: Self-pay | Admitting: Family Medicine

## 2017-09-15 ENCOUNTER — Encounter: Payer: Self-pay | Admitting: Family Medicine

## 2017-09-23 DIAGNOSIS — F17200 Nicotine dependence, unspecified, uncomplicated: Secondary | ICD-10-CM | POA: Insufficient documentation

## 2017-09-23 DIAGNOSIS — I2510 Atherosclerotic heart disease of native coronary artery without angina pectoris: Secondary | ICD-10-CM | POA: Insufficient documentation

## 2017-09-23 NOTE — Progress Notes (Signed)
Cardiology Office Note  Date:  09/25/2017   ID:  Craig Koch, DOB 1955-07-08, MRN [id number]  PCP:  Tonia Ghent, MD   Chief Complaint  Patient presents with  . other    Ref by Dr. Damita Dunnings to establish care for CAD; CABG x 5 in 2004. Former patient of Dr. Rosezella [place]. Meds reviewed by the pt. verbally.  Pt. c/o shortness of breath with over exertion.     HPI:  Craig Koch is a 62 year old male with past medical history of CAD,  CABG with LIMA to the  LAD, SVG to OM2/OM3, SVG to diag Diabetes, poorly controlled , 9.0 Smoker HTN Depression hyperlipidemia Referred by Dr. Damita Dunnings for consultation of his CAD, hx of CABG  New to our office, he reports that he has no chest pain concerning for angina Long discussion concerning his prior cardiac history  Stress test 03/2008 EF 45% Fixed defect involving the inferior wall most consistent with scar versus hibernating myocardium.  Stress test March 2012 ? 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  PSH:    Past Surgical History:  Procedure Laterality Date  . CATARACT EXTRACTION Left 09/2015  . CORONARY ARTERY BYPASS GRAFT     (CABG with LIMA to the  LAD, SVG to OM2/OM3, SVG  to diag  . Left ankle surgery      repair of fracture  . Right lower leg surgery     rod    Current Outpatient Medications  Medication Sig Dispense Refill  . ALPRAZolam (XANAX) 0.5 MG tablet Take 1 tablet (0.5 mg total) by mouth daily as needed for anxiety. 30 tablet 2  . aspirin 325 MG tablet Take 325 mg by mouth daily.    . BD INSULIN SYRINGE U/F 31G X 5/16" 0.5 ML MISC USE DAILY AS DIRECTED. DX E11.9 100 each 2  . DULoxetine (CYMBALTA) 20 MG capsule TAKE 1 CAPSULE BY MOUTH EVERY DAY 90 capsule 3  . Fexofenadine HCl (ALLEGRA ALLERGY PO) Take 1 tablet by mouth daily as needed.    [name] ibuprofen (ADVIL,MOTRIN) 200 MG tablet Take 400 mg by mouth every 6 (six) hours as needed.     . insulin detemir (LEVEMIR) 100 UNIT/ML injection Inject 0.3 mLs (30 Units total) into the skin at bedtime. 10 mL 5  . insulin lispro (HUMALOG) 100 UNIT/ML injection Inject 0.15 mLs (15 Units total) into the skin 3 (three) times daily with meals. 10 mL 11  . lisinopril (PRINIVIL,ZESTRIL) 2.5 MG tablet TAKE 1 TABLET BY MOUTH DAILY 90 tablet 2  . metFORMIN (GLUCOPHAGE) 500 MG tablet TAKE 1 TABLET (500 MG TOTAL) BY MOUTH 2 (TWO) TIMES DAILY WITH A MEAL. 180 tablet 1  . metoprolol tartrate (LOPRESSOR) 100 MG tablet TAKE 0.5 TABLETS (50 MG TOTAL) BY MOUTH 2 (TWO)  TIMES DAILY. 90 tablet 1  . Omega-3 Fatty Acids (FISH OIL) 1200 MG CAPS Take 2 capsules by mouth 3 (three) times daily with meals.    . ONE TOUCH ULTRA TEST test strip TEST 4 (FOUR) TIMES DAILY. 360 each 3  . rosuvastatin (CRESTOR) 10 MG tablet TAKE 1 TABLET (10 MG TOTAL) BY MOUTH DAILY. 30 tablet 11   No current facility-administered medications for this visit.     Allergies:   Lipitor [atorvastatin calcium]   Social History:  The patient  reports that he has been smoking cigarettes. He has been smoking about 0.50 packs per day. He has quit using smokeless tobacco.  His smokeless tobacco use included snuff. He reports that he drinks about 6.0 standard drinks of alcohol per week. He reports  that he does not use drugs.   Family History:   family history includes Heart disease in his father.    Review of Systems: Review of Systems  Constitutional: Negative.   Respiratory: Negative.   Cardiovascular: Negative.   Gastrointestinal: Negative.   Musculoskeletal: Negative.   Neurological: Negative.   Psychiatric/Behavioral: Negative.   All other systems reviewed and are negative.   PHYSICAL EXAM: VS:  BP 120/70 (BP Location: Right Arm, Patient Position: Sitting, Cuff Size: Normal)   Pulse (!) 54   Ht 6' (1.829 m)   Wt 230 lb 8 oz (104.6 kg)   SpO2 98%   BMI 31.26 kg/m  , BMI Body mass index is 31.26 kg/m. GEN: Well nourished, well developed, in no acute distress  HEENT: normal  Neck: no JVD, carotid bruits, or masses Cardiac: RRR; no murmurs, rubs, or gallops,no edema  Respiratory:  clear to auscultation bilaterally, normal work of breathing GI: soft, nontender, nondistended, + BS MS: no deformity or atrophy  Skin: warm and dry, no rash Neuro:  Strength and sensation are intact Psych: euthymic mood, full affect   Recent Labs: 07/12/2017: ALT 21; BUN 24; Creatinine, Ser 0.95; Potassium 4.9; Sodium 135    Lipid Panel Lab Results  Component Value Date   CHOL 148 07/12/2017   HDL 60.00 07/12/2017   LDLCALC 72 07/12/2017   TRIG 82.0 07/12/2017      Wt Readings from Last 3 Encounters:  09/25/17 230 lb 8 oz (104.6 kg)  07/17/17 225 lb 8 oz (102.3 kg)  07/12/17 225 lb 8 oz (102.3 kg)      ASSESSMENT AND PLAN:  Coronary artery disease of native artery of native heart with stable angina pectoris ([place]) - Plan: EKG 12-Lead Currently with no symptoms of angina. No further workup at this time. Continue current medication regimen. Long discussion concerning anginal symptoms to watch for No testing has been ordered at this time Stressed the importance of diabetes control  Diabetes mellitus with complication ([place]) - Plan: EKG 12-Lead Hemoglobin A1c discussed  with him, 9 Recommended a low carbohydrate diet.Discussed with him in detail Dietary guide provided  Mixed hyperlipidemia Ideally goal LDL should be 60 her last He does not want to increase the Crestor at this time Prefers to do it through low carbohydrate diet and weight loss  Smoker We have encouraged him to continue to work on weaning his cigarettes and smoking cessation. He will continue to work on this and does not want any assistance with chantix. We did provide him with coupon for Chantix if he changes his mind  Essential hypertension Blood pressure is well controlled on today's visit. No changes made to the medications.  Disposition:  F/U  12 months   extensive discussion with patient and review of records  Total encounter time more than 60 minutes  Greater than 50% was spent in counseling and coordination of care with the patient    Orders Placed This Encounter  Procedures  . EKG 12-Lead     Signed, Esmond Plants, M.D., Ph.D. 09/25/2017  [place], [place]

## 2017-09-25 ENCOUNTER — Ambulatory Visit: Payer: Medicare Other | Admitting: Cardiovascular Disease

## 2017-09-25 ENCOUNTER — Encounter: Payer: Self-pay | Admitting: Cardiovascular Disease

## 2017-09-25 VITALS — BP 120/70 | HR 54 | Ht 72.0 in | Wt 230.5 lb

## 2017-09-25 DIAGNOSIS — I25118 Atherosclerotic heart disease of native coronary artery with other forms of angina pectoris: Secondary | ICD-10-CM | POA: Diagnosis not present

## 2017-09-25 DIAGNOSIS — I10 Essential (primary) hypertension: Secondary | ICD-10-CM

## 2017-09-25 DIAGNOSIS — F17200 Nicotine dependence, unspecified, uncomplicated: Secondary | ICD-10-CM

## 2017-09-25 DIAGNOSIS — E118 Type 2 diabetes mellitus with unspecified complications: Secondary | ICD-10-CM | POA: Diagnosis not present

## 2017-09-25 DIAGNOSIS — E782 Mixed hyperlipidemia: Secondary | ICD-10-CM | POA: Diagnosis not present

## 2017-09-25 MED ORDER — SILDENAFIL CITRATE 20 MG PO TABS: 20.0000 mg | ORAL_TABLET | Freq: Three times a day (TID) | ORAL | 3 refills | Status: DC | PRN

## 2017-09-25 MED ORDER — ASPIRIN EC 81 MG PO TBEC: 81.0000 mg | DELAYED_RELEASE_TABLET | Freq: Every day | ORAL | 3 refills | Status: DC

## 2017-09-25 NOTE — Patient Instructions (Addendum)
Watch the carbs   Medication Instructions:   No medication changes made  Labwork:  No new labs needed  Testing/Procedures:  No further testing at this time   Follow-Up: It was a pleasure seeing you in the office today. Please call us if you have new issues that need to be addressed before your next appt.  [phone]  Your physician wants you to follow-up in: 12 months.  You will receive a reminder letter in the mail two months in advance. If you Brydan't receive a letter, please call our office to schedule the follow-up appointment.  If you need a refill on your cardiac medications before your next appointment, please call your pharmacy.  For educational health videos Log in to : [email or web address] Or : [email or web address], password : [email or web address]

## 2017-10-10 ENCOUNTER — Other Ambulatory Visit: Payer: Self-pay

## 2017-10-10 ENCOUNTER — Other Ambulatory Visit: Payer: Self-pay | Admitting: Family Medicine

## 2017-10-10 NOTE — Patient Outreach (Signed)
[facility]) Care Management  10/10/2017  Craig Koch 05/02/1955 [id number]   Medication Adherence call to Craig Koch left a message for patient to call back  patient is due on Lisinopril 2.5 mg under [place].   [place] Management Direct Dial [phone]  Fax [phone] [email or web address]

## 2018-01-23 ENCOUNTER — Other Ambulatory Visit: Payer: Self-pay

## 2018-01-23 MED ORDER — GLUCOSE BLOOD VI STRP: ORAL_STRIP | 3 refills | Status: DC

## 2018-01-24 ENCOUNTER — Other Ambulatory Visit: Payer: Self-pay | Admitting: Family Medicine

## 2018-01-25 NOTE — Telephone Encounter (Signed)
Electronic refill request. Alprazolam Last office visit:   07/17/17 Last Filled:    30 tablet 2 07/17/2017  Please advise.

## 2018-01-26 NOTE — Telephone Encounter (Signed)
Sent.  And he needs to call about f/u with endocrine.  Thanks.

## 2018-01-26 NOTE — Telephone Encounter (Signed)
Patient advised.

## 2018-03-28 ENCOUNTER — Other Ambulatory Visit: Payer: Self-pay | Admitting: Endocrinology

## 2018-04-22 ENCOUNTER — Other Ambulatory Visit: Payer: Self-pay | Admitting: Family Medicine

## 2018-05-11 ENCOUNTER — Other Ambulatory Visit: Payer: Self-pay | Admitting: Family Medicine

## 2018-05-17 ENCOUNTER — Other Ambulatory Visit: Payer: Self-pay

## 2018-05-17 NOTE — Patient Outreach (Signed)
[facility]) Care Management  05/17/2018  Davier Tramell Yam 02-Dec-1955 [id number]   Medication Adherence call to Mr. [name] [place] HIPPA Compliant Voice message left with a call back number. Mr. Tavella is showing past due on Rosuvastatin 10 mg under [place].   [place] Management Direct Dial [phone]  Fax [phone] [email or web address]

## 2018-05-18 ENCOUNTER — Other Ambulatory Visit: Payer: Self-pay | Admitting: Family Medicine

## 2018-06-22 ENCOUNTER — Other Ambulatory Visit: Payer: Self-pay

## 2018-06-22 NOTE — Patient Outreach (Signed)
[facility]) Care Management  06/22/2018  Craig Koch January 26, 1955 [id number]   Medication Adherence call to Craig Koch Telephone call to Patient regarding Medication Adherence unable to reach patient. Craig Koch is showing past due on Rosuvastatin under [place].   [place] Management Direct Dial [phone]  Fax [phone] [email or web address]

## 2018-07-30 ENCOUNTER — Ambulatory Visit: Payer: Medicare Other

## 2018-07-30 ENCOUNTER — Other Ambulatory Visit: Payer: Self-pay | Admitting: Family Medicine

## 2018-07-30 ENCOUNTER — Telehealth: Payer: Self-pay

## 2018-07-30 ENCOUNTER — Other Ambulatory Visit: Payer: Medicare Other

## 2018-07-30 DIAGNOSIS — E118 Type 2 diabetes mellitus with unspecified complications: Secondary | ICD-10-CM

## 2018-07-30 NOTE — Telephone Encounter (Signed)
This nurse attempted to call patient three times, at 8:55, 9:00, and 9:15. Last voicemail left was for patient to call in order to reschedule appointment for AWV.

## 2018-07-31 ENCOUNTER — Other Ambulatory Visit (INDEPENDENT_AMBULATORY_CARE_PROVIDER_SITE_OTHER): Payer: Medicare Other

## 2018-07-31 ENCOUNTER — Other Ambulatory Visit: Payer: Medicare Other

## 2018-07-31 DIAGNOSIS — E118 Type 2 diabetes mellitus with unspecified complications: Secondary | ICD-10-CM

## 2018-07-31 LAB — COMPREHENSIVE METABOLIC PANEL
ALT: 10 U/L (ref 0–53)
AST: 12 U/L (ref 0–37)
Albumin: 4.2 g/dL (ref 3.5–5.2)
Alkaline Phosphatase: 61 U/L (ref 39–117)
BUN: 23 mg/dL (ref 6–23)
CO2: 29 mEq/L (ref 19–32)
Calcium: 9.9 mg/dL (ref 8.4–10.5)
Chloride: 101 mEq/L (ref 96–112)
Creatinine, Ser: 0.89 mg/dL (ref 0.40–1.50)
GFR: 86.23 mL/min (ref >60.00)
Glucose, Bld: 157 mg/dL — ABNORMAL HIGH (ref 70–99)
Potassium: 4.3 mEq/L (ref 3.5–5.1)
Sodium: 139 mEq/L (ref 135–145)
Total Bilirubin: 0.6 mg/dL (ref 0.2–1.2)
Total Protein: 6.7 g/dL (ref 6.0–8.3)

## 2018-07-31 LAB — LIPID PANEL
Cholesterol: 126 mg/dL (ref 0–200)
HDL: 50.4 mg/dL (ref >39.00)
LDL Cholesterol: 51 mg/dL (ref 0–99)
NonHDL: 75.99
Total CHOL/HDL Ratio: 3
Triglycerides: 123 mg/dL (ref 0.0–149.0)
VLDL: 24.6 mg/dL (ref 0.0–40.0)

## 2018-07-31 LAB — HEMOGLOBIN A1C: Hgb A1c MFr Bld: 8.9 % — ABNORMAL HIGH (ref 4.6–6.5)

## 2018-08-03 ENCOUNTER — Ambulatory Visit (INDEPENDENT_AMBULATORY_CARE_PROVIDER_SITE_OTHER): Payer: Medicare Other | Admitting: Family Medicine

## 2018-08-03 ENCOUNTER — Other Ambulatory Visit: Payer: Self-pay

## 2018-08-03 ENCOUNTER — Ambulatory Visit (INDEPENDENT_AMBULATORY_CARE_PROVIDER_SITE_OTHER)
Admission: RE | Admit: 2018-08-03 | Discharge: 2018-08-03 | Disposition: A | Discharge status: Home / Self Care | Payer: Medicare Other | Source: Ambulatory Visit | Attending: Family Medicine | Admitting: Family Medicine

## 2018-08-03 ENCOUNTER — Encounter: Payer: Self-pay | Admitting: Family Medicine

## 2018-08-03 VITALS — BP 142/68 | HR 64 | Temp 97.9°F | Resp 20 | Ht 71.25 in | Wt 216.1 lb

## 2018-08-03 DIAGNOSIS — F411 Generalized anxiety disorder: Secondary | ICD-10-CM

## 2018-08-03 DIAGNOSIS — E118 Type 2 diabetes mellitus with unspecified complications: Secondary | ICD-10-CM

## 2018-08-03 DIAGNOSIS — Z0000 Encounter for general adult medical examination without abnormal findings: Secondary | ICD-10-CM | POA: Diagnosis not present

## 2018-08-03 DIAGNOSIS — E119 Type 2 diabetes mellitus without complications: Secondary | ICD-10-CM

## 2018-08-03 DIAGNOSIS — R399 Unspecified symptoms and signs involving the genitourinary system: Secondary | ICD-10-CM | POA: Diagnosis not present

## 2018-08-03 DIAGNOSIS — M542 Cervicalgia: Secondary | ICD-10-CM

## 2018-08-03 DIAGNOSIS — Z7189 Other specified counseling: Secondary | ICD-10-CM

## 2018-08-03 DIAGNOSIS — Z125 Encounter for screening for malignant neoplasm of prostate: Secondary | ICD-10-CM

## 2018-08-03 DIAGNOSIS — F17200 Nicotine dependence, unspecified, uncomplicated: Secondary | ICD-10-CM

## 2018-08-03 DIAGNOSIS — Z1211 Encounter for screening for malignant neoplasm of colon: Secondary | ICD-10-CM

## 2018-08-03 DIAGNOSIS — E782 Mixed hyperlipidemia: Secondary | ICD-10-CM

## 2018-08-03 MED ORDER — INSULIN DETEMIR 100 UNIT/ML ~~LOC~~ SOLN: 30.0000 [IU] | Freq: Every day | SUBCUTANEOUS | 5 refills | Status: DC

## 2018-08-03 MED ORDER — DULOXETINE HCL 30 MG PO CPEP: ORAL_CAPSULE | ORAL | 1 refills | Status: DC

## 2018-08-03 NOTE — Patient Instructions (Addendum)
Call about going to see Dr. Rockey Situ this fall at the cardiology clinic.  Recheck in 3 months, labs ahead of time.  Try the higher dose of cymbalta, 30mg  a day.  Update me if that isn't helping.  Go to the lab on the way out.  We'll contact you with your xray report.  Check your sugar before and then 2 hours after meals.  I can help adjust your mealtime insulin.   Take care.  Glad to see you.

## 2018-08-03 NOTE — Progress Notes (Signed)
I have personally reviewed the Medicare Annual Wellness questionnaire and have noted 1. The patient's medical and social history 2. Their use of alcohol, tobacco or illicit drugs 3. Their current medications and supplements 4. The patient's functional ability including ADL's, fall risks, home safety risks and hearing or visual             impairment. 5. Diet and physical activities 6. Evidence for depression or mood disorders  The patients weight, height, BMI have been recorded in the chart and visual acuity is per eye clinic.  I have made referrals, counseling and provided education to the patient based review of the above and I have provided the pt with a written personalized care plan for preventive services.  Provider list updated- see scanned forms.  Routine anticipatory guidance given to patient.  See health maintenance. The possibility exists that previously documented standard health maintenance information may have been brought forward from a previous encounter into this note.  If needed, that same information has been updated to reflect the current situation based on today's encounter.    Flu discussed with patient declined. Shingles discussed with patient.  Declined. PNA discussed with patient.  Declined.   Tetanus discussed with patient declined. D/w patient [id number] for colon cancer screening, including IFOB vs. Colonoscopy vs cologuard.  Risks and benefits of both were discussed and patient voiced understanding.  Pt elects for: cologuard.  Ordered. Prostate cancer screening discussed with patient.  Can be done with next set of labs.   Advance directive-wife designated if patient were incapacitated. Cognitive function addressed- see scanned forms- and if abnormal then additional documentation follows.  Normal testing today.  We agreed to observe his memory for now.  He agrees.  He has LUTS.  Slower stream.  D/w pt about pros and cons of PSA check.  See above.  He has cut back  some on smoking, down to <1 PPD.  His kids were encouraging him to quit.    Diabetes:  Using medications without difficulties: yes Hypoglycemic episodes: rare, cautions d/w pt.  Down to 36.   Hyperglycemic episodes:  Feet problems: some B foot tingling.   Blood Sugars averaging: variable, usually ~170s, occ higher than 200.   eye exam within last year: yes, [facility].   Elevated Cholesterol: Using medications without problems: yes Muscle aches: likely not from statin.  Diet compliance: encouraged.  Exercise: walking mainly, d/w pt.    He has had more position neck pain in the last few weeks.  No trauma recently.  More pain with neck ROM to the R.    H/o MRI, d/w pt.  Mild spinal stenosis at C3-4 and C7-T1. 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  There is foraminal encroachment bilaterally.   Moderate spinal stenosis C6-7 due to spurring.  There is foraminal encroachment bilaterally.  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  No pain radiating down the R arm.  He has some B changes in grip, slightly weaker but that isn't an acute change.  More trouble opening a jar that is stuck.  No foot weakness.    Mood d/w pt.  H/o anxiety.  No ADE on meds.  Less effect from cymbalta in the PM, he takes it in the AM.  Rare BZD use.   No SI/HI.    PMH and SH reviewed  Meds, vitals, and allergies reviewed.   ROS: Per HPI.  Unless specifically indicated otherwise in HPI, the patient denies:  General:  fever. Eyes: acute vision changes ENT: sore throat Cardiovascular: chest pain Respiratory: SOB GI: vomiting GU: dysuria Musculoskeletal: acute back pain Derm: acute rash Neuro: acute motor dysfunction Psych: worsening mood Endocrine: polydipsia Heme: bleeding Allergy: hayfever  GEN: nad, alert and oriented HEENT: ncat NECK: supple w/o LA, but pain and crepitus with range of motion.   CV: rrr. PULM: ctab, no inc wob ABD: soft, +bs EXT: no edema SKIN: no  acute rash  Diabetic foot exam: Normal inspection No skin breakdown No calluses  Normal DP pulses Normal sensation to light touch and monofilament Nails normal  Health Maintenance  Topic Date Due  . INFLUENZA VACCINE  04/03/2019 (Originally 08/04/2018)  . PNEUMOCOCCAL POLYSACCHARIDE VACCINE AGE 25-64 HIGH RISK  08/07/2019 (Originally 03/16/1957)  . COLONOSCOPY  03/15/2025 (Originally 03/16/2005)  . COLON CANCER SCREENING ANNUAL FOBT  08/08/2018  . OPHTHALMOLOGY EXAM  09/07/2018  . HEMOGLOBIN A1C  01/31/2019  . FOOT EXAM  08/03/2019  . TETANUS/TDAP  03/03/2020  . Hepatitis C Screening  Completed  . HIV Screening  Completed   See after visit summary.

## 2018-08-04 DIAGNOSIS — C3490 Malignant neoplasm of unspecified part of unspecified bronchus or lung: Secondary | ICD-10-CM

## 2018-08-04 HISTORY — DX: Malignant neoplasm of unspecified part of unspecified bronchus or lung: C34.90

## 2018-08-06 ENCOUNTER — Telehealth: Payer: Self-pay | Admitting: *Deleted

## 2018-08-06 NOTE — Telephone Encounter (Signed)
Patient left a voicemail stating that he would like a call back with the results of the x-rays that was done Friday.

## 2018-08-06 NOTE — Telephone Encounter (Signed)
See notes on imaging.  Thanks.

## 2018-08-07 DIAGNOSIS — M542 Cervicalgia: Secondary | ICD-10-CM | POA: Insufficient documentation

## 2018-08-07 DIAGNOSIS — R399 Unspecified symptoms and signs involving the genitourinary system: Secondary | ICD-10-CM | POA: Insufficient documentation

## 2018-08-07 DIAGNOSIS — Z0000 Encounter for general adult medical examination without abnormal findings: Secondary | ICD-10-CM | POA: Insufficient documentation

## 2018-08-07 NOTE — Assessment & Plan Note (Signed)
Labs discussed with patient.  I want her to check his sugar before then 2 hours after meals and give me the extra data so we can help adjust his medication.  See after visit summary.  Recheck in 3 months.

## 2018-08-07 NOTE — Assessment & Plan Note (Signed)
Flu discussed with patient declined. Shingles discussed with patient.  Declined. PNA discussed with patient.  Declined.   Tetanus discussed with patient declined. D/w patient [id number] for colon cancer screening, including IFOB vs. Colonoscopy vs cologuard.  Risks and benefits of both were discussed and patient voiced understanding.  Pt elects for: cologuard.  Ordered. Prostate cancer screening discussed with patient.  Can be done with next set of labs.   Advance directive-wife designated if patient were incapacitated. Cognitive function addressed- see scanned forms- and if abnormal then additional documentation follows.  Normal testing today.  We agreed to observe his memory for now.  He agrees.

## 2018-08-07 NOTE — Assessment & Plan Note (Signed)
Advance directive- wife designated if patient were incapacitated.  

## 2018-08-07 NOTE — Assessment & Plan Note (Signed)
Mood d/w pt.  H/o anxiety.  No ADE on meds.  Less effect from cymbalta in the PM, he takes it in the AM.  Rare BZD use.   No SI/HI.    Increase Cymbalta to 30 mg.  That may help.  Okay for outpatient follow-up.

## 2018-08-07 NOTE — Assessment & Plan Note (Signed)
He has LUTS.  Slower stream.  D/w pt about pros and cons of PSA check.  See above.

## 2018-08-07 NOTE — Assessment & Plan Note (Signed)
He has cut back some on smoking, down to <1 PPD.  His kids were encouraging him to quit.

## 2018-08-07 NOTE — Assessment & Plan Note (Signed)
Continue statin.  Encouraged work on diet and exercise.

## 2018-08-07 NOTE — Assessment & Plan Note (Signed)
Chronic issue for patient, already treated with duloxetine but still having pain.  See notes on imaging.  Previous imaging discussed with patient.  No emergent symptoms.

## 2018-08-08 ENCOUNTER — Telehealth: Payer: Self-pay

## 2018-08-08 NOTE — Telephone Encounter (Signed)
Left message for patient to call back  

## 2018-08-14 ENCOUNTER — Other Ambulatory Visit: Payer: Self-pay | Admitting: Family Medicine

## 2018-08-23 ENCOUNTER — Other Ambulatory Visit: Payer: Self-pay

## 2018-08-23 ENCOUNTER — Encounter: Payer: Self-pay | Admitting: Intensive Care

## 2018-08-23 ENCOUNTER — Observation Stay
Admission: EM | Admit: 2018-08-23 | Discharge: 2018-08-24 | Disposition: A | Discharge status: Home / Self Care | Payer: Medicare Other | Attending: Internal Medicine | Admitting: Internal Medicine

## 2018-08-23 ENCOUNTER — Emergency Department: Payer: Medicare Other

## 2018-08-23 ENCOUNTER — Telehealth: Payer: Self-pay

## 2018-08-23 DIAGNOSIS — R0602 Shortness of breath: Secondary | ICD-10-CM | POA: Diagnosis not present

## 2018-08-23 DIAGNOSIS — F329 Major depressive disorder, single episode, unspecified: Secondary | ICD-10-CM | POA: Diagnosis not present

## 2018-08-23 DIAGNOSIS — I700 Atherosclerosis of aorta: Secondary | ICD-10-CM | POA: Diagnosis not present

## 2018-08-23 DIAGNOSIS — K5730 Diverticulosis of large intestine without perforation or abscess without bleeding: Secondary | ICD-10-CM | POA: Diagnosis not present

## 2018-08-23 DIAGNOSIS — Z20828 Contact with and (suspected) exposure to other viral communicable diseases: Secondary | ICD-10-CM | POA: Insufficient documentation

## 2018-08-23 DIAGNOSIS — Z794 Long term (current) use of insulin: Secondary | ICD-10-CM | POA: Diagnosis not present

## 2018-08-23 DIAGNOSIS — I2510 Atherosclerotic heart disease of native coronary artery without angina pectoris: Secondary | ICD-10-CM | POA: Diagnosis not present

## 2018-08-23 DIAGNOSIS — J90 Pleural effusion, not elsewhere classified: Secondary | ICD-10-CM | POA: Insufficient documentation

## 2018-08-23 DIAGNOSIS — C3492 Malignant neoplasm of unspecified part of left bronchus or lung: Secondary | ICD-10-CM | POA: Diagnosis not present

## 2018-08-23 DIAGNOSIS — J9819 Other pulmonary collapse: Secondary | ICD-10-CM | POA: Diagnosis not present

## 2018-08-23 DIAGNOSIS — Z79899 Other long term (current) drug therapy: Secondary | ICD-10-CM | POA: Diagnosis not present

## 2018-08-23 DIAGNOSIS — Z8249 Family history of ischemic heart disease and other diseases of the circulatory system: Secondary | ICD-10-CM | POA: Insufficient documentation

## 2018-08-23 DIAGNOSIS — F419 Anxiety disorder, unspecified: Secondary | ICD-10-CM | POA: Diagnosis not present

## 2018-08-23 DIAGNOSIS — Z951 Presence of aortocoronary bypass graft: Secondary | ICD-10-CM | POA: Insufficient documentation

## 2018-08-23 DIAGNOSIS — Z888 Allergy status to other drugs, medicaments and biological substances status: Secondary | ICD-10-CM | POA: Insufficient documentation

## 2018-08-23 DIAGNOSIS — I10 Essential (primary) hypertension: Secondary | ICD-10-CM | POA: Insufficient documentation

## 2018-08-23 DIAGNOSIS — R7989 Other specified abnormal findings of blood chemistry: Secondary | ICD-10-CM | POA: Insufficient documentation

## 2018-08-23 DIAGNOSIS — E118 Type 2 diabetes mellitus with unspecified complications: Secondary | ICD-10-CM | POA: Diagnosis not present

## 2018-08-23 DIAGNOSIS — J9811 Atelectasis: Secondary | ICD-10-CM | POA: Insufficient documentation

## 2018-08-23 DIAGNOSIS — J9601 Acute respiratory failure with hypoxia: Secondary | ICD-10-CM | POA: Diagnosis not present

## 2018-08-23 DIAGNOSIS — M1990 Unspecified osteoarthritis, unspecified site: Secondary | ICD-10-CM | POA: Diagnosis not present

## 2018-08-23 DIAGNOSIS — E1165 Type 2 diabetes mellitus with hyperglycemia: Secondary | ICD-10-CM | POA: Insufficient documentation

## 2018-08-23 DIAGNOSIS — R778 Other specified abnormalities of plasma proteins: Secondary | ICD-10-CM | POA: Diagnosis present

## 2018-08-23 DIAGNOSIS — F17210 Nicotine dependence, cigarettes, uncomplicated: Secondary | ICD-10-CM | POA: Diagnosis not present

## 2018-08-23 DIAGNOSIS — K8020 Calculus of gallbladder without cholecystitis without obstruction: Secondary | ICD-10-CM | POA: Diagnosis not present

## 2018-08-23 DIAGNOSIS — I248 Other forms of acute ischemic heart disease: Secondary | ICD-10-CM | POA: Insufficient documentation

## 2018-08-23 DIAGNOSIS — E785 Hyperlipidemia, unspecified: Secondary | ICD-10-CM | POA: Insufficient documentation

## 2018-08-23 DIAGNOSIS — Z7982 Long term (current) use of aspirin: Secondary | ICD-10-CM | POA: Insufficient documentation

## 2018-08-23 DIAGNOSIS — Z9889 Other specified postprocedural states: Secondary | ICD-10-CM

## 2018-08-23 LAB — CBC
HCT: 42.2 % (ref 39.0–52.0)
Hemoglobin: 14.5 g/dL (ref 13.0–17.0)
MCH: 32.7 pg (ref 26.0–34.0)
MCHC: 34.4 g/dL (ref 30.0–36.0)
MCV: 95 fL (ref 80.0–100.0)
Platelets: 279 10*3/uL (ref 150–400)
RBC: 4.44 MIL/uL (ref 4.22–5.81)
RDW: 13.1 % (ref 11.5–15.5)
WBC: 7.3 10*3/uL (ref 4.0–10.5)
nRBC: 0 % (ref 0.0–0.2)

## 2018-08-23 LAB — BASIC METABOLIC PANEL
Anion gap: 8 (ref 5–15)
BUN: 18 mg/dL (ref 8–23)
CO2: 26 mmol/L (ref 22–32)
Calcium: 9.1 mg/dL (ref 8.9–10.3)
Chloride: 103 mmol/L (ref 98–111)
Creatinine, Ser: 0.88 mg/dL (ref 0.61–1.24)
GFR calc Af Amer: >60 mL/min (ref >60)
GFR calc non Af Amer: >60 mL/min (ref >60)
Glucose, Bld: 185 mg/dL — ABNORMAL HIGH (ref 70–99)
Potassium: 3.9 mmol/L (ref 3.5–5.1)
Sodium: 137 mmol/L (ref 135–145)

## 2018-08-23 LAB — TROPONIN I (HIGH SENSITIVITY): Troponin I (High Sensitivity): 18 ng/L — ABNORMAL HIGH (ref <18)

## 2018-08-23 MED ORDER — IOHEXOL 300 MG/ML  SOLN
100.0000 mL | Freq: Once | INTRAMUSCULAR | Status: AC | PRN
  Administered 2018-08-23: 100 mL via INTRAVENOUS

## 2018-08-23 NOTE — ED Provider Notes (Signed)
[facility] Emergency Department Provider Note ________   First MD Initiated Contact with Patient 08/23/18 2312     (approximate)  I have reviewed the triage vital signs and the nursing notes.   HISTORY  Chief Complaint Shortness of Breath   HPI Craig Koch is a 63 y.o. male with below list of previous medical conditions including hypertension hyperlipidemia CAD status post 5 vessel CABG presents to the emergency department with a 2-week history of dyspnea and productive cough.  Patient denies any chest pain no lower extremity pain or swelling.  Patient denies any orthopnea.  Patient denies any fever.        Past Medical History:  Diagnosis Date   Anxiety    Arthritis    Coronary artery disease    Diabetes mellitus    Dyslipidemia    Hx of CABG    Hyperlipidemia    Hypertension     Patient Active Problem List   Diagnosis Date Noted   Elevated troponin 08/24/2018   Medicare annual wellness visit, subsequent 08/07/2018   Lower urinary tract symptoms (LUTS) 08/07/2018   Neck pain 08/07/2018   CAD (coronary artery disease), native coronary artery 09/23/2017   Smoker 09/23/2017   Chronic joint pain 10/18/2016   Healthcare maintenance 07/17/2016   Advance care planning 07/17/2016   Fungal rash of torso 05/06/2016   Trigger finger 05/06/2016   History of alcohol use 11/17/2015   Skin lesion 05/19/2015   Anxiety state 02/19/2015   Hand weakness 05/23/2011   Diabetes mellitus with complication ([place]) 02/72/5366   HLD (hyperlipidemia) 03/05/2010   Essential hypertension 03/05/2010   Coronary atherosclerosis 03/05/2010    Past Surgical History:  Procedure Laterality Date   CATARACT EXTRACTION Left 09/2015   CORONARY ARTERY BYPASS GRAFT     (CABG with LIMA to the  LAD, SVG to OM2/OM3, SVG  to diag   Left ankle surgery     repair of fracture   Right lower leg surgery     rod    Prior to Admission  medications   Medication Sig Start Date End Date Taking? Authorizing Provider  aspirin 325 MG tablet Take 325 mg by mouth daily.   Yes [provider]  BD INSULIN SYRINGE U/F 31G X 5/16" 0.5 ML MISC USE DAILY AS DIRECTED. DX E11.9 05/11/18  Yes Tonia Ghent, MD  DULoxetine (CYMBALTA) 30 MG capsule TAKE 1 CAPSULE BY MOUTH EVERY DAY 08/03/18  Yes Tonia Ghent, MD  Fexofenadine HCl [facility] ALLERGY PO) Take 1 tablet by mouth daily as needed.   Yes [provider]  glucose blood (ONE TOUCH ULTRA TEST) test strip USE TO TEST GLUCOSE LEVELS 4 (FOUR) TIMES DAILY. 01/23/18  Yes Renato Shin, MD  HUMALOG 100 UNIT/ML injection INJECT 15 UNITS INTO THE SKIN 3 (THREE) TIMES DAILY WITH MEALS. 03/29/18  Yes Renato Shin, MD  ibuprofen (ADVIL,MOTRIN) 200 MG tablet Take 400 mg by mouth every 6 (six) hours as needed.    Yes [provider]  insulin detemir (LEVEMIR) 100 UNIT/ML injection Inject 0.3 mLs (30 Units total) into the skin at bedtime. 08/03/18  Yes Tonia Ghent, MD  lisinopril (ZESTRIL) 2.5 MG tablet TAKE 1 TABLET BY MOUTH DAILY 04/23/18  Yes Tonia Ghent, MD  metFORMIN (GLUCOPHAGE) 500 MG tablet TAKE 1 TABLET (500 MG TOTAL) BY MOUTH 2 (TWO) TIMES DAILY WITH A MEAL. 05/18/18  Yes Tonia Ghent, MD  metoprolol tartrate (LOPRESSOR) 100 MG tablet TAKE 0.5  TABLETS (50 MG TOTAL) BY MOUTH 2 (TWO) TIMES DAILY. 05/18/18  Yes Tonia Ghent, MD  Omega-3 Fatty Acids (FISH OIL) 1200 MG CAPS Take 2 capsules by mouth 3 (three) times daily with meals.   Yes [provider]  rosuvastatin (CRESTOR) 10 MG tablet TAKE 1 TABLET (10 MG TOTAL) BY MOUTH DAILY. 10/10/17  Yes Tonia Ghent, MD  aspirin EC 81 MG tablet Take 1 tablet (81 mg total) by mouth daily. Patient not taking: Reported on 08/24/2018 09/25/17   Minna Merritts, MD  sildenafil (REVATIO) 20 MG tablet Take 1 tablet (20 mg total) by mouth 3 (three) times daily as needed. Patient not taking: Reported on 08/24/2018  09/25/17   Minna Merritts, MD    Allergies Lipitor [atorvastatin calcium]  Family History  Problem Relation Age of Onset   Heart disease Father    Colon cancer Neg Hx    Prostate cancer Neg Hx    Diabetes Neg Hx     Social History Social History   Tobacco Use   Smoking status: Current Every Day Smoker    Packs/day: 0.50    Types: Cigarettes    Last attempt to quit: 07/06/2013    Years since quitting: 5.1   Smokeless tobacco: Former Systems developer    Types: Snuff  Substance Use Topics   Alcohol use: Yes    Alcohol/week: 6.0 standard drinks    Types: 6 Cans of beer per week    Comment: occ, average 6 pack in a week   Drug use: No    Review of Systems Constitutional: No fever/chills Eyes: No visual changes. ENT: No sore throat. Cardiovascular: Denies chest pain. Respiratory: Positive for dyspnea and cough Gastrointestinal: No abdominal pain.  No nausea, no vomiting.  No diarrhea.  No constipation. Genitourinary: Negative for dysuria. Musculoskeletal: Negative for neck pain.  Negative for back pain. Integumentary: Negative for rash. Neurological: Negative for headaches, focal weakness or numbness.  ____________________________________________   PHYSICAL EXAM:  VITAL SIGNS: ED Triage Vitals  Enc Vitals Group     BP 08/23/18 1843 (!) 151/75     Pulse Rate 08/23/18 1843 91     Resp 08/23/18 1843 20     Temp 08/23/18 1843 98.4 F (36.9 C)     Temp Source 08/23/18 1843 Oral     SpO2 08/23/18 1843 95 %     Weight 08/23/18 1844 99.8 kg (220 lb)     Height 08/23/18 1844 1.816 m (5' 11.5")     Head Circumference --      Peak Flow --      Pain Score 08/23/18 1844 0     Pain Loc --      Pain Edu? --      Excl. in [place]? --     Constitutional: Alert and oriented.  Eyes: Conjunctivae are normal.  Mouth/Throat: Mucous membranes are moist. Neck: No stridor.  No meningeal signs.   Cardiovascular: Normal rate, regular rhythm. Good peripheral circulation. Grossly normal  heart sounds. Respiratory: Normal respiratory effort.  Diffuse rhonchi, bibasilar Rales worse on the left Gastrointestinal: Soft and nontender. No distention.  Musculoskeletal: No lower extremity tenderness nor edema. No gross deformities of extremities. Neurologic:  Normal speech and language. No gross focal neurologic deficits are appreciated.  Skin:  Skin is warm, dry and intact. Psychiatric: Mood and affect are normal. Speech and behavior are normal.  ____________________________________________   LABS (all labs ordered are listed, but only abnormal results are displayed)  Labs  Reviewed  BASIC METABOLIC PANEL - Abnormal; Notable for the following components:      Result Value   Glucose, Bld 185 (*)    All other components within normal limits  TROPONIN I (HIGH SENSITIVITY) - Abnormal; Notable for the following components:   Troponin I (High Sensitivity) 18 (*)    All other components within normal limits  TROPONIN I (HIGH SENSITIVITY) - Abnormal; Notable for the following components:   Troponin I (High Sensitivity) 18 (*)    All other components within normal limits  TROPONIN I (HIGH SENSITIVITY) - Abnormal; Notable for the following components:   Troponin I (High Sensitivity) 19 (*)    All other components within normal limits  SARS CORONAVIRUS 2 (HOSPITAL ORDER, [place] [facility])  CBC  LIPASE, BLOOD  TROPONIN I (HIGH SENSITIVITY)  TROPONIN I (HIGH SENSITIVITY)   ____________________________________________  EKG  ED ECG REPORT I, [place] Mariyah Upshaw, the attending physician, personally viewed and interpreted this ECG.   Date: 08/24/2018  EKG Time: 6:52 PM  Rate: 93  Rhythm: Normal sinus rhythm  Axis: Normal  Intervals: Normal  ST&T Change: None  ____________________________________________  RADIOLOGY I, [place] Wyndi Northrup, personally viewed and evaluated these images (plain radiographs) as part of my medical decision making, as well as reviewing  the written report by the radiologist.  ED MD interpretation: Large left pleural effusion with associated basilar atelectasis on chest x-ray per radiologist  CT revealed a large left pleural effusion with near collapse of the left lower lobe  Official radiology report(s): Dg Chest 2 View  Result Date: 08/23/2018 CLINICAL DATA:  Shortness of breath 2 weeks with productive cough. EXAM: CHEST - 2 VIEW COMPARISON:  None. FINDINGS: There is moderate opacification over the lower half of the left lung likely large effusion with associated basilar atelectasis. Fluid tracks into the apex. Right lung is clear. Left heart border is obscured by the large left effusion. Visualized mediastinum is unremarkable. Degenerative change of the spine. IMPRESSION: Large left pleural effusion likely with associated basilar atelectasis. Electronically Signed   By: Marin Olp M.D.   On: 08/23/2018 19:13   Ct Chest W Contrast  Result Date: 08/24/2018 CLINICAL DATA:  Chest pain, shortness of breath, fusion suspected. Smoker, approximately 25 pack years EXAM: CT CHEST AND ABDOMEN WITH CONTRAST TECHNIQUE: Multidetector CT imaging of the chest and abdomen was performed following the standard protocol during bolus administration of intravenous contrast. CONTRAST:  1106mL OMNIPAQUE IOHEXOL 300 MG/ML  SOLN COMPARISON:  Same-day radiograph FINDINGS: CT CHEST FINDINGS Cardiovascular: Postsurgical changes related to prior CABG. Extensive calcification of the native coronary arteries. Heart is normal size. No pericardial effusion. Atherosclerotic plaque within the normal caliber aorta. Pulmonary arteries are normal caliber. Mediastinum/Nodes: Small amount stranding in the mediastinum likely related to prior CABG. Subcentimeter hypoattenuating nodule in the left thyroid lobe (2/3). Trachea is patent. Esophagus is unremarkable. Lungs/Pleura: There is a large left pleural effusion with near complete collapse of the left lower lobe and  extensive volume loss in the lingula and portions of the left upper lobe as well. There is abrupt tapering of the tracheobronchial tree is inter is into the airless portions of lung. Some ground-glass attenuation is noted adjacent to the fusion likely reflecting passive atelectatic change. In the right lung is clear. No pneumothorax. No visible nodules or suspicious masses though these are not fully excluded in the regions of airless lung. Musculoskeletal: Postsurgical changes from prior sternotomy with intact and aligned sutures. Multilevel  degenerative changes are present in the imaged portions of the spine. Additional degenerative changes in the shoulders. No acute or suspicious osseous lesion. No chest wall abnormality. CT ABDOMEN FINDINGS Hepatobiliary: No focal liver lesion. Few calcified gallstones layer dependently within the gallbladder. No biliary ductal dilatation. Pancreas: Unremarkable. No pancreatic ductal dilatation or surrounding inflammatory changes. Spleen: Normal in size without focal abnormality. Adrenals/Urinary Tract: Normal adrenal glands. Mild bilateral nonspecific perinephric stranding, a nonspecific finding though may correlate with either age or decreased renal function. Lobular appearance of the kidneys may reflect cortical scarring. Kidneys are otherwise unremarkable, without renal calculi, suspicious lesion, or hydronephrosis. Bladder is unremarkable. Stomach/Bowel: Distal esophagus, stomach and duodenal sweep are unremarkable. No bowel wall thickening or dilatation. No evidence of obstruction. A normal appendix is visualized. Scattered colonic diverticula without focal pericolonic inflammation to suggest diverticulitis. Vascular/Lymphatic: Extensive atheromatous plaque throughout the aorta and branch vessels. [place] of the renal arteries are heavily diseased of the branches opacify normally. Normal opacification of the portal venous Other: No abdominopelvic free fluid or free gas. No  bowel containing hernias. Small amount of soft tissue infiltration in the low anterior abdomen. Musculoskeletal: Multilevel degenerative changes are present in the imaged portions of the spine. Few remote appearing compression deformities in the lumbar spine at L2, L4 and L5 as well as a limbus vertebrae at L3 could correlate for point tenderness however IMPRESSION: 1. Large left pleural effusion with near complete collapse of the left lower lobe and extensive volume loss in the lingula and portions of the left upper lobe. Underlying consolidation or lesion cannot be excluded. 2. No acute intra-abdominal process. 3. Cholelithiasis without evidence of acute cholecystitis. 4. Diverticulosis without evidence of acute diverticulitis. 5. Coronary atherosclerosis, post CABG. 6. Bilateral renal cortical scarring 7. Aortic Atherosclerosis (ICD10-I70.0). Electronically Signed   By: Lovena Le M.D.   On: 08/24/2018 00:19   Ct Abdomen Pelvis W Contrast  Result Date: 08/24/2018 CLINICAL DATA:  Chest pain, shortness of breath, fusion suspected. Smoker, approximately 25 pack years EXAM: CT CHEST AND ABDOMEN WITH CONTRAST TECHNIQUE: Multidetector CT imaging of the chest and abdomen was performed following the standard protocol during bolus administration of intravenous contrast. CONTRAST:  149mL OMNIPAQUE IOHEXOL 300 MG/ML  SOLN COMPARISON:  Same-day radiograph FINDINGS: CT CHEST FINDINGS Cardiovascular: Postsurgical changes related to prior CABG. Extensive calcification of the native coronary arteries. Heart is normal size. No pericardial effusion. Atherosclerotic plaque within the normal caliber aorta. Pulmonary arteries are normal caliber. Mediastinum/Nodes: Small amount stranding in the mediastinum likely related to prior CABG. Subcentimeter hypoattenuating nodule in the left thyroid lobe (2/3). Trachea is patent. Esophagus is unremarkable. Lungs/Pleura: There is a large left pleural effusion with near complete collapse  of the left lower lobe and extensive volume loss in the lingula and portions of the left upper lobe as well. There is abrupt tapering of the tracheobronchial tree is inter is into the airless portions of lung. Some ground-glass attenuation is noted adjacent to the fusion likely reflecting passive atelectatic change. In the right lung is clear. No pneumothorax. No visible nodules or suspicious masses though these are not fully excluded in the regions of airless lung. Musculoskeletal: Postsurgical changes from prior sternotomy with intact and aligned sutures. Multilevel degenerative changes are present in the imaged portions of the spine. Additional degenerative changes in the shoulders. No acute or suspicious osseous lesion. No chest wall abnormality. CT ABDOMEN FINDINGS Hepatobiliary: No focal liver lesion. Few calcified gallstones layer dependently within the gallbladder. No biliary  ductal dilatation. Pancreas: Unremarkable. No pancreatic ductal dilatation or surrounding inflammatory changes. Spleen: Normal in size without focal abnormality. Adrenals/Urinary Tract: Normal adrenal glands. Mild bilateral nonspecific perinephric stranding, a nonspecific finding though may correlate with either age or decreased renal function. Lobular appearance of the kidneys may reflect cortical scarring. Kidneys are otherwise unremarkable, without renal calculi, suspicious lesion, or hydronephrosis. Bladder is unremarkable. Stomach/Bowel: Distal esophagus, stomach and duodenal sweep are unremarkable. No bowel wall thickening or dilatation. No evidence of obstruction. A normal appendix is visualized. Scattered colonic diverticula without focal pericolonic inflammation to suggest diverticulitis. Vascular/Lymphatic: Extensive atheromatous plaque throughout the aorta and branch vessels. [place] of the renal arteries are heavily diseased of the branches opacify normally. Normal opacification of the portal venous Other: No abdominopelvic  free fluid or free gas. No bowel containing hernias. Small amount of soft tissue infiltration in the low anterior abdomen. Musculoskeletal: Multilevel degenerative changes are present in the imaged portions of the spine. Few remote appearing compression deformities in the lumbar spine at L2, L4 and L5 as well as a limbus vertebrae at L3 could correlate for point tenderness however IMPRESSION: 1. Large left pleural effusion with near complete collapse of the left lower lobe and extensive volume loss in the lingula and portions of the left upper lobe. Underlying consolidation or lesion cannot be excluded. 2. No acute intra-abdominal process. 3. Cholelithiasis without evidence of acute cholecystitis. 4. Diverticulosis without evidence of acute diverticulitis. 5. Coronary atherosclerosis, post CABG. 6. Bilateral renal cortical scarring 7. Aortic Atherosclerosis (ICD10-I70.0). Electronically Signed   By: Lovena Le M.D.   On: 08/24/2018 00:19      Procedures   ____________________________________________   INITIAL IMPRESSION / MDM / ASSESSMENT AND PLAN / ED COURSE  As part of my medical decision making, I reviewed the following data within the electronic MEDICAL RECORD NUMBER   63 year old male presenting with above-stated history and physical exam concerning for possible pneumonia CHF pulmonary emboli pleural effusion.  Chest x-ray consistent with a large left pleural effusion.  CT scan was performed which consistent with chest x-ray findings.  Patient's current oxygen saturation 90% on room air and as such 2 L of oxygen via nasal cannula was applied with resultant O2 sat 94%.  Patient discussed with Dr. Jannifer Franklin for hospital admission further evaluation and management  ____________________________________________  FINAL CLINICAL IMPRESSION(S) / ED DIAGNOSES  Final diagnoses:  Pleural effusion on left     MEDICATIONS GIVEN DURING THIS VISIT:  Medications  iohexol (OMNIPAQUE) 300 MG/ML solution  100 mL (100 mLs Intravenous Contrast Given 08/23/18 2354)     ED Discharge Orders    None      *Please note:  Craig Koch was evaluated in Emergency Department on 08/24/2018 for the symptoms described in the history of present illness. He was evaluated in the context of the global COVID-19 pandemic, which necessitated consideration that the patient might be at risk for infection with the SARS-CoV-2 virus that causes COVID-19. Institutional protocols and algorithms that pertain to the evaluation of patients at risk for COVID-19 are in a state of rapid change based on information released by regulatory bodies including the CDC and federal and state organizations. These policies and algorithms were followed during the patient's care in the ED.  Some ED evaluations and interventions may be delayed as a result of limited staffing during the pandemic.*  Note:  This document was prepared using Dragon voice recognition software and may include unintentional dictation errors.   Marjean Donna  N, MD 08/24/18 1610

## 2018-08-23 NOTE — ED Triage Notes (Addendum)
Patient c/o sob for a couple of weeks with cough. Denies pain

## 2018-08-23 NOTE — Telephone Encounter (Signed)
Noted. Thanks.  Will await the f/u notes.

## 2018-08-23 NOTE — ED Notes (Signed)
Pt to CT via stretcher accomp by CT tech 

## 2018-08-23 NOTE — Telephone Encounter (Signed)
Pt calling for 2 wks having SOB and dizziness (lightheadness; room does not spin around)on and off. Pt said the SOB is upon exertion and is getting worse. No CP or H/A. Pt had prod cough with clear phlegm; no other covid symptoms and no travel or known exposure to covid. Pt will go to [facility] now for eval. FYI to Dr Damita Dunnings.

## 2018-08-23 NOTE — ED Notes (Addendum)
Pt sitting up on chair in exam room with no distress noted; Pt reports SHOB with exertion only and prod cough clear sputum for several weeks; spoke with PCP (Dr Damita Dunnings at [facility]) and rec to have evaluation at ED; denies pain or other acccomp symptoms; +smoker 1/2 pack/day for 50+yrs; pt assisted into hosp gown & on card monitor; pt & wife voice good understanding of plan of care; resp even/unlab, diminished BS to left side; apical audible & regular

## 2018-08-24 ENCOUNTER — Observation Stay (HOSPITAL_BASED_OUTPATIENT_CLINIC_OR_DEPARTMENT_OTHER)
Admit: 2018-08-24 | Discharge: 2018-08-24 | Disposition: A | Discharge status: Home / Self Care | Payer: Medicare Other | Attending: Internal Medicine | Admitting: Internal Medicine

## 2018-08-24 ENCOUNTER — Observation Stay: Payer: Medicare Other

## 2018-08-24 DIAGNOSIS — Z9889 Other specified postprocedural states: Secondary | ICD-10-CM | POA: Diagnosis not present

## 2018-08-24 DIAGNOSIS — R0602 Shortness of breath: Secondary | ICD-10-CM | POA: Diagnosis not present

## 2018-08-24 DIAGNOSIS — K8020 Calculus of gallbladder without cholecystitis without obstruction: Secondary | ICD-10-CM | POA: Diagnosis not present

## 2018-08-24 DIAGNOSIS — R7989 Other specified abnormal findings of blood chemistry: Secondary | ICD-10-CM | POA: Diagnosis present

## 2018-08-24 DIAGNOSIS — J90 Pleural effusion, not elsewhere classified: Secondary | ICD-10-CM

## 2018-08-24 DIAGNOSIS — I361 Nonrheumatic tricuspid (valve) insufficiency: Secondary | ICD-10-CM | POA: Diagnosis not present

## 2018-08-24 DIAGNOSIS — E118 Type 2 diabetes mellitus with unspecified complications: Secondary | ICD-10-CM | POA: Diagnosis not present

## 2018-08-24 DIAGNOSIS — I2510 Atherosclerotic heart disease of native coronary artery without angina pectoris: Secondary | ICD-10-CM | POA: Diagnosis not present

## 2018-08-24 DIAGNOSIS — J9601 Acute respiratory failure with hypoxia: Secondary | ICD-10-CM | POA: Diagnosis not present

## 2018-08-24 DIAGNOSIS — J910 Malignant pleural effusion: Secondary | ICD-10-CM | POA: Diagnosis not present

## 2018-08-24 DIAGNOSIS — K5730 Diverticulosis of large intestine without perforation or abscess without bleeding: Secondary | ICD-10-CM | POA: Diagnosis not present

## 2018-08-24 DIAGNOSIS — I25118 Atherosclerotic heart disease of native coronary artery with other forms of angina pectoris: Secondary | ICD-10-CM

## 2018-08-24 DIAGNOSIS — I10 Essential (primary) hypertension: Secondary | ICD-10-CM | POA: Diagnosis not present

## 2018-08-24 DIAGNOSIS — Z7689 Persons encountering health services in other specified circumstances: Secondary | ICD-10-CM | POA: Diagnosis not present

## 2018-08-24 DIAGNOSIS — R778 Other specified abnormalities of plasma proteins: Secondary | ICD-10-CM | POA: Diagnosis present

## 2018-08-24 LAB — LACTATE DEHYDROGENASE: LDH: 123 U/L (ref 98–192)

## 2018-08-24 LAB — ECHOCARDIOGRAM COMPLETE
Height: 71.5 in
Weight: 3520 oz

## 2018-08-24 LAB — TSH: TSH: 2.029 u[IU]/mL (ref 0.350–4.500)

## 2018-08-24 LAB — TROPONIN I (HIGH SENSITIVITY)
Troponin I (High Sensitivity): 18 ng/L — ABNORMAL HIGH (ref <18)
Troponin I (High Sensitivity): 19 ng/L — ABNORMAL HIGH (ref <18)
Troponin I (High Sensitivity): 24 ng/L — ABNORMAL HIGH (ref <18)

## 2018-08-24 LAB — GLUCOSE, CAPILLARY
Glucose-Capillary: 200 mg/dL — ABNORMAL HIGH (ref 70–99)
Glucose-Capillary: 209 mg/dL — ABNORMAL HIGH (ref 70–99)

## 2018-08-24 LAB — LIPASE, BLOOD: Lipase: 14 U/L (ref 11–51)

## 2018-08-24 LAB — PROTEIN, PLEURAL OR PERITONEAL FLUID: Total protein, fluid: 4.6 g/dL

## 2018-08-24 LAB — BODY FLUID CELL COUNT WITH DIFFERENTIAL
Eos, Fluid: 0 %
Lymphs, Fluid: 37 %
Monocyte-Macrophage-Serous Fluid: 61 %
Neutrophil Count, Fluid: 2 %
Total Nucleated Cell Count, Fluid: 2356 cu mm

## 2018-08-24 LAB — LACTATE DEHYDROGENASE, PLEURAL OR PERITONEAL FLUID: LD, Fluid: 512 U/L — ABNORMAL HIGH (ref 3–23)

## 2018-08-24 LAB — SARS CORONAVIRUS 2 BY RT PCR (HOSPITAL ORDER, PERFORMED IN ~~LOC~~ HOSPITAL LAB): SARS Coronavirus 2: NEGATIVE

## 2018-08-24 LAB — GLUCOSE, PLEURAL OR PERITONEAL FLUID: Glucose, Fluid: 177 mg/dL

## 2018-08-24 LAB — PROTEIN, TOTAL: Total Protein: 6.8 g/dL (ref 6.5–8.1)

## 2018-08-24 MED ORDER — METOPROLOL TARTRATE 25 MG PO TABS
50.0000 mg | ORAL_TABLET | Freq: Once | ORAL | Status: AC
  Administered 2018-08-24: 50 mg via ORAL

## 2018-08-24 MED ORDER — OMEGA-3-ACID ETHYL ESTERS 1 G PO CAPS
2.0000 g | ORAL_CAPSULE | Freq: Two times a day (BID) | ORAL | Status: DC
  Administered 2018-08-24: 2 g via ORAL
  Filled 2018-08-24 (×2): qty 2

## 2018-08-24 MED ORDER — ENOXAPARIN SODIUM 40 MG/0.4ML ~~LOC~~ SOLN: 40.0000 mg | SUBCUTANEOUS | Status: DC

## 2018-08-24 MED ORDER — LABETALOL HCL 5 MG/ML IV SOLN: 5.0000 mg | INTRAVENOUS | Status: DC | PRN

## 2018-08-24 MED ORDER — ACETAMINOPHEN 325 MG PO TABS: 650.0000 mg | ORAL_TABLET | Freq: Four times a day (QID) | ORAL | Status: DC | PRN

## 2018-08-24 MED ORDER — DOCUSATE SODIUM 100 MG PO CAPS
100.0000 mg | ORAL_CAPSULE | Freq: Two times a day (BID) | ORAL | Status: DC
  Administered 2018-08-24: 100 mg via ORAL
  Filled 2018-08-24: qty 1

## 2018-08-24 MED ORDER — INSULIN DETEMIR 100 UNIT/ML ~~LOC~~ SOLN
18.0000 [IU] | Freq: Every day | SUBCUTANEOUS | Status: DC
  Filled 2018-08-24: qty 0.18

## 2018-08-24 MED ORDER — ACETAMINOPHEN 650 MG RE SUPP: 650.0000 mg | Freq: Four times a day (QID) | RECTAL | Status: DC | PRN

## 2018-08-24 MED ORDER — FUROSEMIDE 20 MG PO TABS: 20.0000 mg | ORAL_TABLET | Freq: Every day | ORAL | 0 refills | Status: DC

## 2018-08-24 MED ORDER — DOXYCYCLINE HYCLATE 100 MG PO TABS: 100.0000 mg | ORAL_TABLET | Freq: Two times a day (BID) | ORAL | 0 refills | Status: DC

## 2018-08-24 MED ORDER — ONDANSETRON HCL 4 MG PO TABS: 4.0000 mg | ORAL_TABLET | Freq: Four times a day (QID) | ORAL | Status: DC | PRN

## 2018-08-24 MED ORDER — ROSUVASTATIN CALCIUM 10 MG PO TABS
10.0000 mg | ORAL_TABLET | Freq: Every day | ORAL | Status: DC
  Administered 2018-08-24: 10 mg via ORAL
  Filled 2018-08-24: qty 1

## 2018-08-24 MED ORDER — DULOXETINE HCL 30 MG PO CPEP
30.0000 mg | ORAL_CAPSULE | Freq: Every day | ORAL | Status: DC
  Administered 2018-08-24: 30 mg via ORAL
  Filled 2018-08-24: qty 1

## 2018-08-24 MED ORDER — DOXYCYCLINE HYCLATE 100 MG PO TABS
100.0000 mg | ORAL_TABLET | Freq: Two times a day (BID) | ORAL | Status: DC
  Administered 2018-08-24: 100 mg via ORAL
  Filled 2018-08-24: qty 1

## 2018-08-24 MED ORDER — FEXOFENADINE HCL 60 MG PO TABS: 60.0000 mg | ORAL_TABLET | Freq: Every day | ORAL | Status: DC | PRN

## 2018-08-24 MED ORDER — ASPIRIN EC 325 MG PO TBEC
325.0000 mg | DELAYED_RELEASE_TABLET | Freq: Every day | ORAL | Status: DC
  Administered 2018-08-24: 325 mg via ORAL
  Filled 2018-08-24: qty 1

## 2018-08-24 MED ORDER — ALBUTEROL SULFATE HFA 108 (90 BASE) MCG/ACT IN AERS: 2.0000 | INHALATION_SPRAY | Freq: Four times a day (QID) | RESPIRATORY_TRACT | 0 refills | Status: DC | PRN

## 2018-08-24 MED ORDER — METOPROLOL TARTRATE 50 MG PO TABS
50.0000 mg | ORAL_TABLET | Freq: Two times a day (BID) | ORAL | Status: DC
  Filled 2018-08-24: qty 1

## 2018-08-24 MED ORDER — INSULIN ASPART 100 UNIT/ML ~~LOC~~ SOLN
0.0000 [IU] | Freq: Three times a day (TID) | SUBCUTANEOUS | Status: DC
  Administered 2018-08-24: 5 [IU] via SUBCUTANEOUS
  Filled 2018-08-24: qty 1

## 2018-08-24 MED ORDER — ONDANSETRON HCL 4 MG/2ML IJ SOLN: 4.0000 mg | Freq: Four times a day (QID) | INTRAMUSCULAR | Status: DC | PRN

## 2018-08-24 MED ORDER — LISINOPRIL 2.5 MG PO TABS
2.5000 mg | ORAL_TABLET | Freq: Every day | ORAL | Status: DC
  Filled 2018-08-24: qty 1

## 2018-08-24 NOTE — Care Management Obs Status (Signed)
[facility] NOTIFICATION   Patient Details  Name: Craig Koch MRN: [id number] Date of Birth: 1955/02/23   Medicare Observation Status Notification Given:  Yes    Katrina Stack, RN 08/24/2018, 8:57 AM

## 2018-08-24 NOTE — ED Notes (Signed)
Pt up to restroom with steady gait. Pt denies dizziness.

## 2018-08-24 NOTE — Discharge Instructions (Signed)
Pleural Effusion Pleural effusion is an abnormal buildup of fluid in the layers of tissue between the lungs and the inside of the chest (pleural space) The two layers of tissue that line the lungs and the inside of the chest are called pleura. Usually, there is no air in the space between the pleura, only a thin layer of fluid. Some conditions can cause a large amount of fluid to build up, which can cause the lung to collapse if untreated. A pleural effusion is usually caused by another disease that requires treatment. What are the causes? Pleural effusion can be caused by:  Heart failure.  Certain infections, such as pneumonia or tuberculosis.  Cancer.  A blood clot in the lung (pulmonary embolism).  Complications from surgery, such as from open heart surgery.  Liver disease (cirrhosis).  Kidney disease. What are the signs or symptoms? In some cases, pleural effusion may cause no symptoms. If symptoms are present, they may include:  Shortness of breath, especially when lying down.  Chest pain. This may get worse when taking a deep breath.  Fever.  Dry, long-lasting (chronic) cough.  Hiccups.  Rapid breathing. An underlying condition that is causing the pleural effusion (such as heart failure, pneumonia, blood clots, tuberculosis, or cancer) may also cause other symptoms. How is this diagnosed? This condition may be diagnosed based on:  Your symptoms and medical history.  A physical exam.  A chest X-ray.  A procedure to use a needle to remove fluid from the pleural space (thoracentesis). This fluid is tested.  Other imaging studies of the chest, such as ultrasound or CT scan. How is this treated? Depending on the cause of your condition, treatment may include:  Treating the underlying condition that is causing the effusion. When that condition improves, the effusion will also improve. Examples of treatment for underlying conditions include: ? Antibiotic medicines to  treat an infection. ? Diuretics or other heart medicines to treat heart failure.  Thoracentesis.  Placing a thin flexible tube under your skin and into your chest to continuously drain the effusion (indwelling pleural catheter).  Surgery to remove the outer layer of tissue from the pleural space (decortication).  A procedure to put medicine into the chest cavity to seal the pleural space and prevent fluid buildup (pleurodesis).  Chemotherapy and radiation therapy, if you have cancerous (malignant) pleural effusion. These treatments are typically used to treat cancer. They kill certain cells in the body. Follow these instructions at home:  Take over-the-counter and prescription medicines only as told by your health care provider.  Ask your health care provider what activities are safe for you.  Keep track of how long you are able to do mild exercise (such as walking) before you get short of breath. Write down this information to share with your health care provider. Your ability to exercise should improve over time.  Do not use any products that contain nicotine or tobacco, such as cigarettes and e-cigarettes. If you need help quitting, ask your health care provider.  Keep all follow-up visits as told by your health care provider. This is important. Contact a health care provider if:  The amount of time that you are able to do mild exercise: ? Decreases. ? Does not improve with time.  You have a fever. Get help right away if:  You are short of breath.  You develop chest pain.  You develop a new cough. Summary  Pleural effusion is an abnormal buildup of fluid in the layers  of tissue between the lungs and the inside of the chest.  Pleural effusion can have many causes, including heart failure, pulmonary embolism, infections, or cancer.  Symptoms of pleural effusion can include shortness of breath, chest pain, fever, long-lasting (chronic) cough, hiccups, or rapid  breathing.  Diagnosis often involves making images of the chest (such as with ultrasound or X-ray) and removing fluid (thoracentesis) to send for testing.  Treatment for pleural effusion depends on what underlying condition is causing it. This information is not intended to replace advice given to you by your health care provider. Make sure you discuss any questions you have with your health care provider. Document Released: 12/20/2004 Document Revised: 12/02/2016 Document Reviewed: 08/25/2016 [employer] Patient Education  2020 [employer].

## 2018-08-24 NOTE — Discharge Summary (Signed)
[facility] [place] NAME: Craig Koch    MR#:  [id number]  DATE OF BIRTH:  07/15/1955  DATE OF ADMISSION:  08/23/2018 ADMITTING PHYSICIAN: Harrie Foreman, MD  DATE OF DISCHARGE: 08/24/2018  PRIMARY CARE PHYSICIAN: Tonia Ghent, MD    ADMISSION DIAGNOSIS:  shortness of breath  DISCHARGE DIAGNOSIS:  Active Problems:   Elevated troponin   SECONDARY DIAGNOSIS:   Past Medical History:  Diagnosis Date  . Anxiety   . Arthritis   . Coronary artery disease   . Diabetes mellitus   . Dyslipidemia   . Hx of CABG   . Hyperlipidemia   . Hypertension     HOSPITAL COURSE:   1.  Large left-sided pleural effusion.  Patient had an interventional radiology thoracentesis which drew off 1800 mL.  Patient feels much better and wants to go home.  I did consult pulmonary Dr. Lanney Gins to see the patient in follow-up the patient.  I prescribed doxycycline and a albuterol inhaler and also low-dose Lasix. 2.  Elevated troponin secondary to demand ischemia with a large pleural effusion 3.  History of CAD.  Continue aspirin and beta-blocker 4.  Hypertension on metoprolol and lisinopril 5.  Type 2 diabetes mellitus.  Continue usual medications 6.  Hyperlipidemia continue statin   DISCHARGE CONDITIONS:   Satisfactory  CONSULTS OBTAINED:  Treatment Team:  Minna Merritts, MD  DRUG ALLERGIES:   Allergies  Allergen Reactions  . Lipitor [Atorvastatin Calcium] Other (See Comments)    Aches.  Tolerated crestor.     DISCHARGE MEDICATIONS:   Allergies as of 08/24/2018      Reactions   Lipitor [atorvastatin Calcium] Other (See Comments)   Aches.  Tolerated crestor.       Medication List    STOP taking these medications   sildenafil 20 MG tablet Commonly known as: Revatio     TAKE these medications   albuterol 108 (90 Base) MCG/ACT inhaler Commonly known as: VENTOLIN HFA Inhale 2 puffs into the lungs every 6 (six) hours as needed  for wheezing or shortness of breath.   ALLEGRA ALLERGY PO Take 1 tablet by mouth daily as needed.   aspirin 325 MG tablet Take 325 mg by mouth daily. What changed: Another medication with the same name was removed. Continue taking this medication, and follow the directions you see here.   BD Insulin Syringe U/F 31G X 5/16" 0.5 ML Misc Generic drug: Insulin Syringe-Needle U-100 USE DAILY AS DIRECTED. DX E11.9   doxycycline 100 MG tablet Commonly known as: VIBRA-TABS Take 1 tablet (100 mg total) by mouth every 12 (twelve) hours.   DULoxetine 30 MG capsule Commonly known as: CYMBALTA TAKE 1 CAPSULE BY MOUTH EVERY DAY   Fish Oil 1200 MG Caps Take 2 capsules by mouth 3 (three) times daily with meals.   furosemide 20 MG tablet Commonly known as: Lasix Take 1 tablet (20 mg total) by mouth daily.   glucose blood test strip Commonly known as: ONE TOUCH ULTRA TEST USE TO TEST GLUCOSE LEVELS 4 (FOUR) TIMES DAILY.   HumaLOG 100 UNIT/ML injection Generic drug: insulin lispro INJECT 15 UNITS INTO THE SKIN 3 (THREE) TIMES DAILY WITH MEALS.   ibuprofen 200 MG tablet Commonly known as: ADVIL Take 400 mg by mouth every 6 (six) hours as needed.   insulin detemir 100 UNIT/ML injection Commonly known as: LEVEMIR Inject 0.3 mLs (30 Units total) into the skin at bedtime.   lisinopril 2.5  MG tablet Commonly known as: ZESTRIL TAKE 1 TABLET BY MOUTH DAILY   metFORMIN 500 MG tablet Commonly known as: GLUCOPHAGE TAKE 1 TABLET (500 MG TOTAL) BY MOUTH 2 (TWO) TIMES DAILY WITH A MEAL.   metoprolol tartrate 100 MG tablet Commonly known as: LOPRESSOR TAKE 0.5 TABLETS (50 MG TOTAL) BY MOUTH 2 (TWO) TIMES DAILY.   rosuvastatin 10 MG tablet Commonly known as: CRESTOR TAKE 1 TABLET (10 MG TOTAL) BY MOUTH DAILY.        DISCHARGE INSTRUCTIONS:   Follow-up with PMD 5 days Follow-up with pulmonary 1 to 2 weeks  If you experience worsening of your admission symptoms, develop shortness of  breath, life threatening emergency, suicidal or homicidal thoughts you must seek medical attention immediately by calling 911 or calling your MD immediately  if symptoms less severe.  You Must read complete instructions/literature along with all the possible adverse reactions/side effects for all the Medicines you take and that have been prescribed to you. Take any new Medicines after you have completely understood and accept all the possible adverse reactions/side effects.   Please note  You were cared for by a hospitalist during your hospital stay. If you have any questions about your discharge medications or the care you received while you were in the hospital after you are discharged, you can call the unit and asked to speak with the hospitalist on call if the hospitalist that took care of you is not available. Once you are discharged, your primary care physician will handle any further medical issues. Please note that NO REFILLS for any discharge medications will be authorized once you are discharged, as it is imperative that you return to your primary care physician (or establish a relationship with a primary care physician if you do not have one) for your aftercare needs so that they can reassess your need for medications and monitor your lab values.    Today   CHIEF COMPLAINT:   Chief Complaint  Patient presents with  . Shortness of Breath    HISTORY OF PRESENT ILLNESS:  Craig Koch  is a 63 y.o. male came in with shortness of breath   VITAL SIGNS:  Blood pressure (!) 156/62, pulse (!) 115, temperature 98.1 F (36.7 C), temperature source Oral, resp. rate 14, height 5' 11.5" (1.816 m), weight 99.8 kg, SpO2 98 %. They did not give the patient's metoprolol this morning I/O:  No intake or output data in the 24 hours ending 08/24/18 1445  PHYSICAL EXAMINATION:  GENERAL:  63 y.o.-year-old patient lying in the bed with no acute distress.  EYES: Pupils equal, round, reactive to  light and accommodation. No scleral icterus. Extraocular muscles intact.  HEENT: Head atraumatic, normocephalic. Oropharynx and nasopharynx clear.  NECK:  Supple, no jugular venous distention. No thyroid enlargement, no tenderness.  LUNGS: Decreased breath sounds bilateral base, no wheezing, rales,rhonchi or crepitation. No use of accessory muscles of respiration.  CARDIOVASCULAR: S1, S2 normal. No murmurs, rubs, or gallops.  ABDOMEN: Soft, non-tender, non-distended. Bowel sounds present. No organomegaly or mass.  EXTREMITIES: No pedal edema, cyanosis, or clubbing.  NEUROLOGIC: Cranial nerves II through XII are intact. Muscle strength 5/5 in all extremities. Sensation intact. Gait not checked.  PSYCHIATRIC: The patient is alert and oriented x 3.  SKIN: No obvious rash, lesion, or ulcer.   DATA REVIEW:   CBC Recent Labs  Lab 08/23/18 1848  WBC 7.3  HGB 14.5  HCT 42.2  PLT 279    Chemistries  Recent  Labs  Lab 08/23/18 1848  NA 137  K 3.9  CL 103  CO2 26  GLUCOSE 185*  BUN 18  CREATININE 0.88  CALCIUM 9.1     Microbiology Results  Results for orders placed or performed during the hospital encounter of 08/23/18  SARS Coronavirus 2 [facility] order, Performed in [facility] hospital lab) Nasopharyngeal Nasopharyngeal Swab     Status: None   Collection Time: 08/23/18 11:36 PM   Specimen: Nasopharyngeal Swab  Result Value Ref Range Status   SARS Coronavirus 2 NEGATIVE NEGATIVE Final    Comment: (NOTE) If result is NEGATIVE SARS-CoV-2 target nucleic acids are NOT DETECTED. The SARS-CoV-2 RNA is generally detectable in upper and lower  respiratory specimens during the acute phase of infection. The lowest  concentration of SARS-CoV-2 viral copies this assay can detect is 250  copies / mL. A negative result does not preclude SARS-CoV-2 infection  and should not be used as the sole basis for treatment or other  patient management decisions.  A negative result may occur with   improper specimen collection / handling, submission of specimen other  than nasopharyngeal swab, presence of viral mutation(s) within the  areas targeted by this assay, and inadequate number of viral copies  (<250 copies / mL). A negative result must be combined with clinical  observations, patient history, and epidemiological information. If result is POSITIVE SARS-CoV-2 target nucleic acids are DETECTED. The SARS-CoV-2 RNA is generally detectable in upper and lower  respiratory specimens dur ing the acute phase of infection.  Positive  results are indicative of active infection with SARS-CoV-2.  Clinical  correlation with patient history and other diagnostic information is  necessary to determine patient infection status.  Positive results do  not rule out bacterial infection or co-infection with other viruses. If result is PRESUMPTIVE POSTIVE SARS-CoV-2 nucleic acids MAY BE PRESENT.   A presumptive positive result was obtained on the submitted specimen  and confirmed on repeat testing.  While 2019 novel coronavirus  (SARS-CoV-2) nucleic acids may be present in the submitted sample  additional confirmatory testing may be necessary for epidemiological  and / or clinical management purposes  to differentiate between  SARS-CoV-2 and other Sarbecovirus currently known to infect humans.  If clinically indicated additional testing with an alternate test  methodology [phone]) is advised. The SARS-CoV-2 RNA is generally  detectable in upper and lower respiratory sp ecimens during the acute  phase of infection. The expected result is Negative. Fact Sheet for Patients:  StrictlyIdeas.no Fact Sheet for Healthcare Providers: BankingDealers.co.za This test is not yet approved or cleared by the [place] FDA and has been authorized for detection and/or diagnosis of SARS-CoV-2 by FDA under an Emergency Use Authorization (EUA).  This EUA will  remain in effect (meaning this test can be used) for the duration of the COVID-19 declaration under Section 564(b)(1) of the Act, 21 U.S.C. section 360bbb-3(b)(1), unless the authorization is terminated or revoked sooner. Performed at [facility], [address] [place], [place] [address]     RADIOLOGY:  Dg Chest 2 View  Result Date: 08/23/2018 CLINICAL DATA:  Shortness of breath 2 weeks with productive cough. EXAM: CHEST - 2 VIEW COMPARISON:  None. FINDINGS: There is moderate opacification over the lower half of the left lung likely large effusion with associated basilar atelectasis. Fluid tracks into the apex. Right lung is clear. Left heart border is obscured by the large left effusion. Visualized mediastinum is unremarkable. Degenerative change of the spine.  IMPRESSION: Large left pleural effusion likely with associated basilar atelectasis. Electronically Signed   By: Marin Olp M.D.   On: 08/23/2018 19:13   Ct Chest W Contrast  Result Date: 08/24/2018 CLINICAL DATA:  Chest pain, shortness of breath, fusion suspected. Smoker, approximately 25 pack years EXAM: CT CHEST AND ABDOMEN WITH CONTRAST TECHNIQUE: Multidetector CT imaging of the chest and abdomen was performed following the standard protocol during bolus administration of intravenous contrast. CONTRAST:  175mL OMNIPAQUE IOHEXOL 300 MG/ML  SOLN COMPARISON:  Same-day radiograph FINDINGS: CT CHEST FINDINGS Cardiovascular: Postsurgical changes related to prior CABG. Extensive calcification of the native coronary arteries. Heart is normal size. No pericardial effusion. Atherosclerotic plaque within the normal caliber aorta. Pulmonary arteries are normal caliber. Mediastinum/Nodes: Small amount stranding in the mediastinum likely related to prior CABG. Subcentimeter hypoattenuating nodule in the left thyroid lobe (2/3). Trachea is patent. Esophagus is unremarkable. Lungs/Pleura: There is a large left pleural effusion with near  complete collapse of the left lower lobe and extensive volume loss in the lingula and portions of the left upper lobe as well. There is abrupt tapering of the tracheobronchial tree is inter is into the airless portions of lung. Some ground-glass attenuation is noted adjacent to the fusion likely reflecting passive atelectatic change. In the right lung is clear. No pneumothorax. No visible nodules or suspicious masses though these are not fully excluded in the regions of airless lung. Musculoskeletal: Postsurgical changes from prior sternotomy with intact and aligned sutures. Multilevel degenerative changes are present in the imaged portions of the spine. Additional degenerative changes in the shoulders. No acute or suspicious osseous lesion. No chest wall abnormality. CT ABDOMEN FINDINGS Hepatobiliary: No focal liver lesion. Few calcified gallstones layer dependently within the gallbladder. No biliary ductal dilatation. Pancreas: Unremarkable. No pancreatic ductal dilatation or surrounding inflammatory changes. Spleen: Normal in size without focal abnormality. Adrenals/Urinary Tract: Normal adrenal glands. Mild bilateral nonspecific perinephric stranding, a nonspecific finding though may correlate with either age or decreased renal function. Lobular appearance of the kidneys may reflect cortical scarring. Kidneys are otherwise unremarkable, without renal calculi, suspicious lesion, or hydronephrosis. Bladder is unremarkable. Stomach/Bowel: Distal esophagus, stomach and duodenal sweep are unremarkable. No bowel wall thickening or dilatation. No evidence of obstruction. A normal appendix is visualized. Scattered colonic diverticula without focal pericolonic inflammation to suggest diverticulitis. Vascular/Lymphatic: Extensive atheromatous plaque throughout the aorta and branch vessels. [place] of the renal arteries are heavily diseased of the branches opacify normally. Normal opacification of the portal venous Other: No  abdominopelvic free fluid or free gas. No bowel containing hernias. Small amount of soft tissue infiltration in the low anterior abdomen. Musculoskeletal: Multilevel degenerative changes are present in the imaged portions of the spine. Few remote appearing compression deformities in the lumbar spine at L2, L4 and L5 as well as a limbus vertebrae at L3 could correlate for point tenderness however IMPRESSION: 1. Large left pleural effusion with near complete collapse of the left lower lobe and extensive volume loss in the lingula and portions of the left upper lobe. Underlying consolidation or lesion cannot be excluded. 2. No acute intra-abdominal process. 3. Cholelithiasis without evidence of acute cholecystitis. 4. Diverticulosis without evidence of acute diverticulitis. 5. Coronary atherosclerosis, post CABG. 6. Bilateral renal cortical scarring 7. Aortic Atherosclerosis (ICD10-I70.0). Electronically Signed   By: Lovena Le M.D.   On: 08/24/2018 00:19   Ct Abdomen Pelvis W Contrast  Result Date: 08/24/2018 CLINICAL DATA:  Chest pain, shortness of breath, fusion suspected. Smoker,  approximately 25 pack years EXAM: CT CHEST AND ABDOMEN WITH CONTRAST TECHNIQUE: Multidetector CT imaging of the chest and abdomen was performed following the standard protocol during bolus administration of intravenous contrast. CONTRAST:  131mL OMNIPAQUE IOHEXOL 300 MG/ML  SOLN COMPARISON:  Same-day radiograph FINDINGS: CT CHEST FINDINGS Cardiovascular: Postsurgical changes related to prior CABG. Extensive calcification of the native coronary arteries. Heart is normal size. No pericardial effusion. Atherosclerotic plaque within the normal caliber aorta. Pulmonary arteries are normal caliber. Mediastinum/Nodes: Small amount stranding in the mediastinum likely related to prior CABG. Subcentimeter hypoattenuating nodule in the left thyroid lobe (2/3). Trachea is patent. Esophagus is unremarkable. Lungs/Pleura: There is a large left  pleural effusion with near complete collapse of the left lower lobe and extensive volume loss in the lingula and portions of the left upper lobe as well. There is abrupt tapering of the tracheobronchial tree is inter is into the airless portions of lung. Some ground-glass attenuation is noted adjacent to the fusion likely reflecting passive atelectatic change. In the right lung is clear. No pneumothorax. No visible nodules or suspicious masses though these are not fully excluded in the regions of airless lung. Musculoskeletal: Postsurgical changes from prior sternotomy with intact and aligned sutures. Multilevel degenerative changes are present in the imaged portions of the spine. Additional degenerative changes in the shoulders. No acute or suspicious osseous lesion. No chest wall abnormality. CT ABDOMEN FINDINGS Hepatobiliary: No focal liver lesion. Few calcified gallstones layer dependently within the gallbladder. No biliary ductal dilatation. Pancreas: Unremarkable. No pancreatic ductal dilatation or surrounding inflammatory changes. Spleen: Normal in size without focal abnormality. Adrenals/Urinary Tract: Normal adrenal glands. Mild bilateral nonspecific perinephric stranding, a nonspecific finding though may correlate with either age or decreased renal function. Lobular appearance of the kidneys may reflect cortical scarring. Kidneys are otherwise unremarkable, without renal calculi, suspicious lesion, or hydronephrosis. Bladder is unremarkable. Stomach/Bowel: Distal esophagus, stomach and duodenal sweep are unremarkable. No bowel wall thickening or dilatation. No evidence of obstruction. A normal appendix is visualized. Scattered colonic diverticula without focal pericolonic inflammation to suggest diverticulitis. Vascular/Lymphatic: Extensive atheromatous plaque throughout the aorta and branch vessels. [place] of the renal arteries are heavily diseased of the branches opacify normally. Normal opacification of  the portal venous Other: No abdominopelvic free fluid or free gas. No bowel containing hernias. Small amount of soft tissue infiltration in the low anterior abdomen. Musculoskeletal: Multilevel degenerative changes are present in the imaged portions of the spine. Few remote appearing compression deformities in the lumbar spine at L2, L4 and L5 as well as a limbus vertebrae at L3 could correlate for point tenderness however IMPRESSION: 1. Large left pleural effusion with near complete collapse of the left lower lobe and extensive volume loss in the lingula and portions of the left upper lobe. Underlying consolidation or lesion cannot be excluded. 2. No acute intra-abdominal process. 3. Cholelithiasis without evidence of acute cholecystitis. 4. Diverticulosis without evidence of acute diverticulitis. 5. Coronary atherosclerosis, post CABG. 6. Bilateral renal cortical scarring 7. Aortic Atherosclerosis (ICD10-I70.0). Electronically Signed   By: Lovena Le M.D.   On: 08/24/2018 00:19   US Thoracentesis Asp Pleural Space W/img Guide  Result Date: 08/24/2018 INDICATION: 63 year old with large left pleural effusion and shortness of breath. EXAM: ULTRASOUND GUIDED LEFT THORACENTESIS MEDICATIONS: None. COMPLICATIONS: None immediate. PROCEDURE: An ultrasound guided thoracentesis was thoroughly discussed with the patient and questions answered. The benefits, risks, alternatives and complications were also discussed. The patient understands and wishes to proceed with the procedure.  Written consent was obtained. Ultrasound was performed to localize and mark an adequate pocket of fluid in the left chest. The area was then prepped and draped in the normal sterile fashion. 1% Lidocaine was used for local anesthesia. Under ultrasound guidance a 6 Fr Safe-T-Centesis catheter was introduced. Thoracentesis was performed. The catheter was removed and a dressing applied. FINDINGS: A total of approximately 1.8 L of red fluid was  removed. Samples were sent to the laboratory as requested by the clinical team. IMPRESSION: Successful ultrasound guided left thoracentesis yielding 1.8 L of pleural fluid. Electronically Signed   By: Markus Daft M.D.   On: 08/24/2018 12:29     Management plans discussed with the patient, family and they are in agreement.  CODE STATUS:     Code Status Orders  (From admission, onward)         Start     Ordered   08/24/18 0557  Full code  Continuous     08/24/18 0556        Code Status History    This patient has a current code status but no historical code status.   Advance Care Planning Activity      TOTAL TIME TAKING CARE OF THIS PATIENT: 35 minutes.    Loletha Grayer M.D on 08/24/2018 at 2:45 PM  Between 7am to 6pm - Pager - [phone]  After 6pm go to [email or web address] - password [email or web address]  [facility]  [phone]  CC: Primary care physician; Tonia Ghent, MD

## 2018-08-24 NOTE — Consult Note (Signed)
Pulmonary Medicine          Date: 08/24/2018,   MRN# [id number] Craig Koch May 29, 1955     AdmissionWeight: 99.8 kg                 CurrentWeight: 99.8 kg      CHIEF COMPLAINT:   Acute hypoxemic respiratory failure secondary to large pleural effusion   HISTORY OF PRESENT ILLNESS   The patient with past medical history of CAD status post CABG, hypertension and diabetes presents to the emergency department complaining of shortness of breath.  The patient reports dyspnea on exertion for several weeks.  He is also been coughing up clear frothy sputum.  He denies chest pain, nausea, vomiting or diaphoresis.  He also denies fever. No travel risk factors or known contact with individuals with exposure to novel coronavirus.  Laboratory evaluation revealed mildly elevated troponin.  Chest x-ray also showed large left-sided pleural effusion which is new.  Thus the emergency department staff call hospitalist service for admission.  Patient had CT chest done which shows large pleural effusion which was tapped and shows exudative fluid by LDH with normal fluid glucose.  Cytology is pending.  I discussed these findings with patient.  Post thoracentesis he is on room air with normal vital signs and in no distress.  Patient request to be discharged home we had discussed follow-up in outpatient pulmonary clinic in 10 days he is agreeable to plan.  Discussed with Dr. Earleen Newport , plan for d/c home on albuterol inhaler and empiric abx.    PAST MEDICAL HISTORY   Past Medical History:  Diagnosis Date   Anxiety    Arthritis    Coronary artery disease    Diabetes mellitus    Dyslipidemia    Hx of CABG    Hyperlipidemia    Hypertension      SURGICAL HISTORY   Past Surgical History:  Procedure Laterality Date   CATARACT EXTRACTION Left 09/2015   CORONARY ARTERY BYPASS GRAFT     (CABG with LIMA to the  LAD, SVG to OM2/OM3, SVG  to diag   Left ankle surgery     repair of  fracture   Right lower leg surgery     rod     FAMILY HISTORY   Family History  Problem Relation Age of Onset   Heart disease Father    Colon cancer Neg Hx    Prostate cancer Neg Hx    Diabetes Neg Hx      SOCIAL HISTORY   Social History   Tobacco Use   Smoking status: Current Every Day Smoker    Packs/day: 0.50    Types: Cigarettes    Last attempt to quit: 07/06/2013    Years since quitting: 5.1   Smokeless tobacco: Former Systems developer    Types: Snuff  Substance Use Topics   Alcohol use: Yes    Alcohol/week: 6.0 standard drinks    Types: 6 Cans of beer per week    Comment: occ, average 6 pack in a week   Drug use: No     MEDICATIONS    Home Medication:  Current Outpatient Rx   Order #: [id number] Class: Historical Med   Order #: [id number] Class: Normal   Order #: [id number] Class: Normal   Order #: [id number] Class: Historical Med   Order #: [id number] Class: Normal   Order #: [id number] Class: Normal   Order #: [id number] Class: Historical Med   Order #: [id number] Class: Normal   Order #: [id number] Class:  Normal   Order #: [id number] Class: Normal   Order #: [id number] Class: Normal   Order #: [id number] Class: Historical Med   Order #: [id number] Class: Normal   Order #: [id number] Class: No Print   Order #: [id number] Class: Print    Current Medication:  Current Facility-Administered Medications:    acetaminophen (TYLENOL) tablet 650 mg, 650 mg, Oral, Q6H PRN **OR** acetaminophen (TYLENOL) suppository 650 mg, 650 mg, Rectal, Q6H PRN, Harrie Foreman, MD   aspirin EC tablet 325 mg, 325 mg, Oral, Daily, Harrie Foreman, MD, 325 mg at 08/24/18 1133   docusate sodium (COLACE) capsule 100 mg, 100 mg, Oral, BID, Harrie Foreman, MD, 100 mg at 08/24/18 1133   DULoxetine (CYMBALTA) DR capsule 30 mg, 30 mg, Oral, Daily, Harrie Foreman, MD, 30 mg at 08/24/18 1135   fexofenadine (ALLEGRA) tablet 60 mg, 60 mg, Oral, Daily PRN, Harrie Foreman, MD    insulin aspart (novoLOG) injection 0-15 Units, 0-15 Units, Subcutaneous, TID WC, Harrie Foreman, MD   insulin detemir (LEVEMIR) injection 18 Units, 18 Units, Subcutaneous, QHS, Harrie Foreman, MD   labetalol (NORMODYNE) injection 5-10 mg, 5-10 mg, Intravenous, Q2H PRN, Harrie Foreman, MD   lisinopril (ZESTRIL) tablet 2.5 mg, 2.5 mg, Oral, q1800, Harrie Foreman, MD   metoprolol tartrate (LOPRESSOR) tablet 50 mg, 50 mg, Oral, BID, Harrie Foreman, MD, Stopped at 08/24/18 1137   omega-3 acid ethyl esters (LOVAZA) capsule 2 g, 2 g, Oral, BID, Harrie Foreman, MD, 2 g at 08/24/18 1135   ondansetron (ZOFRAN) tablet 4 mg, 4 mg, Oral, Q6H PRN **OR** ondansetron (ZOFRAN) injection 4 mg, 4 mg, Intravenous, Q6H PRN, Harrie Foreman, MD   rosuvastatin (CRESTOR) tablet 10 mg, 10 mg, Oral, q1800, Harrie Foreman, MD, 10 mg at 08/24/18 1135  Current Outpatient Medications:    aspirin 325 MG tablet, Take 325 mg by mouth daily., Disp: , Rfl:    BD INSULIN SYRINGE U/F 31G X 5/16" 0.5 ML MISC, USE DAILY AS DIRECTED. DX E11.9, Disp: 100 each, Rfl: 1   DULoxetine (CYMBALTA) 30 MG capsule, TAKE 1 CAPSULE BY MOUTH EVERY DAY, Disp: 90 capsule, Rfl: 1   Fexofenadine HCl (ALLEGRA ALLERGY PO), Take 1 tablet by mouth daily as needed., Disp: , Rfl:    glucose blood (ONE TOUCH ULTRA TEST) test strip, USE TO TEST GLUCOSE LEVELS 4 (FOUR) TIMES DAILY., Disp: 400 each, Rfl: 3   HUMALOG 100 UNIT/ML injection, INJECT 15 UNITS INTO THE SKIN 3 (THREE) TIMES DAILY WITH MEALS., Disp: 30 mL, Rfl: 3   ibuprofen (ADVIL,MOTRIN) 200 MG tablet, Take 400 mg by mouth every 6 (six) hours as needed. , Disp: , Rfl:    insulin detemir (LEVEMIR) 100 UNIT/ML injection, Inject 0.3 mLs (30 Units total) into the skin at bedtime., Disp: 10 mL, Rfl: 5   lisinopril (ZESTRIL) 2.5 MG tablet, TAKE 1 TABLET BY MOUTH DAILY, Disp: 90 tablet, Rfl: 2   metFORMIN (GLUCOPHAGE) 500 MG tablet, TAKE 1 TABLET (500 MG TOTAL)  BY MOUTH 2 (TWO) TIMES DAILY WITH A MEAL., Disp: 180 tablet, Rfl: 1   metoprolol tartrate (LOPRESSOR) 100 MG tablet, TAKE 0.5 TABLETS (50 MG TOTAL) BY MOUTH 2 (TWO) TIMES DAILY., Disp: 90 tablet, Rfl: 1   Omega-3 Fatty Acids (FISH OIL) 1200 MG CAPS, Take 2 capsules by mouth 3 (three) times daily with meals., Disp: , Rfl:    rosuvastatin (CRESTOR) 10 MG tablet, TAKE 1 TABLET (10 MG TOTAL) BY MOUTH DAILY., Disp: 90 tablet,  Rfl: 3   aspirin EC 81 MG tablet, Take 1 tablet (81 mg total) by mouth daily. (Patient not taking: Reported on 08/24/2018), Disp: 90 tablet, Rfl: 3   sildenafil (REVATIO) 20 MG tablet, Take 1 tablet (20 mg total) by mouth 3 (three) times daily as needed. (Patient not taking: Reported on 08/24/2018), Disp: 90 tablet, Rfl: 3    ALLERGIES   Lipitor [atorvastatin calcium]     REVIEW OF SYSTEMS    Review of Systems:  Gen:  Denies  fever, sweats, chills weigh loss  HEENT: Denies blurred vision, double vision, ear pain, eye pain, hearing loss, nose bleeds, sore throat Cardiac:  No dizziness, chest pain or heaviness, chest tightness,edema Resp:   Denies cough or sputum porduction, shortness of breath,wheezing, hemoptysis,  Gi: Denies swallowing difficulty, stomach pain, nausea or vomiting, diarrhea, constipation, bowel incontinence Gu:  Denies bladder incontinence, burning urine Ext:   Denies Joint pain, stiffness or swelling Skin: Denies  skin rash, easy bruising or bleeding or hives Endoc:  Denies polyuria, polydipsia , polyphagia or weight change Psych:   Denies depression, insomnia or hallucinations   Other:  All other systems negative   VS: BP 116/65 (BP Location: Left Arm)    Pulse (!) 106    Temp 98.1 F (36.7 C) (Oral)    Resp 18    Ht 5' 11.5" (1.816 m)    Wt 99.8 kg    SpO2 98%    BMI 30.26 kg/m      PHYSICAL EXAM    GENERAL:NAD, no fevers, chills, no weakness no fatigue HEAD: Normocephalic, atraumatic.  EYES: Pupils equal, round, reactive to light.  Extraocular muscles intact. No scleral icterus.  MOUTH: Moist mucosal membrane. Dentition intact. No abscess noted.  EAR, NOSE, THROAT: Clear without exudates. No external lesions.  NECK: Supple. No thyromegaly. No nodules. No JVD.  PULMONARY: Bilateral crackles worse on the left CARDIOVASCULAR: S1 and S2. Regular rate and rhythm. No murmurs, rubs, or gallops. No edema. Pedal pulses 2+ bilaterally.  GASTROINTESTINAL: Soft, nontender, nondistended. No masses. Positive bowel sounds. No hepatosplenomegaly.  MUSCULOSKELETAL: No swelling, clubbing, or edema. Range of motion full in all extremities.  NEUROLOGIC: Cranial nerves II through XII are intact. No gross focal neurological deficits. Sensation intact. Reflexes intact.  SKIN: No ulceration, lesions, rashes, or cyanosis. Skin warm and dry. Turgor intact.  PSYCHIATRIC: Mood, affect within normal limits. The patient is awake, alert and oriented x 3. Insight, judgment intact.       IMAGING    Dg Chest 2 View  Result Date: 08/23/2018 CLINICAL DATA:  Shortness of breath 2 weeks with productive cough. EXAM: CHEST - 2 VIEW COMPARISON:  None. FINDINGS: There is moderate opacification over the lower half of the left lung likely large effusion with associated basilar atelectasis. Fluid tracks into the apex. Right lung is clear. Left heart border is obscured by the large left effusion. Visualized mediastinum is unremarkable. Degenerative change of the spine. IMPRESSION: Large left pleural effusion likely with associated basilar atelectasis. Electronically Signed   By: Marin Olp M.D.   On: 08/23/2018 19:13   Dg Cervical Spine Complete  Result Date: 08/03/2018 CLINICAL DATA:  Neck pain EXAM: CERVICAL SPINE - COMPLETE 4+ VIEW COMPARISON:  07/02/2012 FINDINGS: Cervical spine is well demonstrated to the C7 level on lateral view. Dens and lateral masses are aligned. Vertebral body heights are maintained. Minimal anterolisthesis C4 on C5. Intervertebral  disc height loss most pronounced at C5-6 and C6-7. Moderate multilevel facet and  uncovertebral arthropathy result in bony neural foraminal narrowing, most pronounced at C3-4, C4-5, and C5-6 on the right. Prevertebral soft tissues are within normal limits. IMPRESSION: Moderate multilevel cervical spondylosis, most pronounced at the C5-6 level. Electronically Signed   By: Davina Poke M.D.   On: 08/03/2018 13:53   Ct Chest W Contrast  Result Date: 08/24/2018 CLINICAL DATA:  Chest pain, shortness of breath, fusion suspected. Smoker, approximately 25 pack years EXAM: CT CHEST AND ABDOMEN WITH CONTRAST TECHNIQUE: Multidetector CT imaging of the chest and abdomen was performed following the standard protocol during bolus administration of intravenous contrast. CONTRAST:  148mL OMNIPAQUE IOHEXOL 300 MG/ML  SOLN COMPARISON:  Same-day radiograph FINDINGS: CT CHEST FINDINGS Cardiovascular: Postsurgical changes related to prior CABG. Extensive calcification of the native coronary arteries. Heart is normal size. No pericardial effusion. Atherosclerotic plaque within the normal caliber aorta. Pulmonary arteries are normal caliber. Mediastinum/Nodes: Small amount stranding in the mediastinum likely related to prior CABG. Subcentimeter hypoattenuating nodule in the left thyroid lobe (2/3). Trachea is patent. Esophagus is unremarkable. Lungs/Pleura: There is a large left pleural effusion with near complete collapse of the left lower lobe and extensive volume loss in the lingula and portions of the left upper lobe as well. There is abrupt tapering of the tracheobronchial tree is inter is into the airless portions of lung. Some ground-glass attenuation is noted adjacent to the fusion likely reflecting passive atelectatic change. In the right lung is clear. No pneumothorax. No visible nodules or suspicious masses though these are not fully excluded in the regions of airless lung. Musculoskeletal: Postsurgical changes from  prior sternotomy with intact and aligned sutures. Multilevel degenerative changes are present in the imaged portions of the spine. Additional degenerative changes in the shoulders. No acute or suspicious osseous lesion. No chest wall abnormality. CT ABDOMEN FINDINGS Hepatobiliary: No focal liver lesion. Few calcified gallstones layer dependently within the gallbladder. No biliary ductal dilatation. Pancreas: Unremarkable. No pancreatic ductal dilatation or surrounding inflammatory changes. Spleen: Normal in size without focal abnormality. Adrenals/Urinary Tract: Normal adrenal glands. Mild bilateral nonspecific perinephric stranding, a nonspecific finding though may correlate with either age or decreased renal function. Lobular appearance of the kidneys may reflect cortical scarring. Kidneys are otherwise unremarkable, without renal calculi, suspicious lesion, or hydronephrosis. Bladder is unremarkable. Stomach/Bowel: Distal esophagus, stomach and duodenal sweep are unremarkable. No bowel wall thickening or dilatation. No evidence of obstruction. A normal appendix is visualized. Scattered colonic diverticula without focal pericolonic inflammation to suggest diverticulitis. Vascular/Lymphatic: Extensive atheromatous plaque throughout the aorta and branch vessels. [place] of the renal arteries are heavily diseased of the branches opacify normally. Normal opacification of the portal venous Other: No abdominopelvic free fluid or free gas. No bowel containing hernias. Small amount of soft tissue infiltration in the low anterior abdomen. Musculoskeletal: Multilevel degenerative changes are present in the imaged portions of the spine. Few remote appearing compression deformities in the lumbar spine at L2, L4 and L5 as well as a limbus vertebrae at L3 could correlate for point tenderness however IMPRESSION: 1. Large left pleural effusion with near complete collapse of the left lower lobe and extensive volume loss in the  lingula and portions of the left upper lobe. Underlying consolidation or lesion cannot be excluded. 2. No acute intra-abdominal process. 3. Cholelithiasis without evidence of acute cholecystitis. 4. Diverticulosis without evidence of acute diverticulitis. 5. Coronary atherosclerosis, post CABG. 6. Bilateral renal cortical scarring 7. Aortic Atherosclerosis (ICD10-I70.0). Electronically Signed   By: Elwin Sleight.D.  On: 08/24/2018 00:19   Ct Abdomen Pelvis W Contrast  Result Date: 08/24/2018 CLINICAL DATA:  Chest pain, shortness of breath, fusion suspected. Smoker, approximately 25 pack years EXAM: CT CHEST AND ABDOMEN WITH CONTRAST TECHNIQUE: Multidetector CT imaging of the chest and abdomen was performed following the standard protocol during bolus administration of intravenous contrast. CONTRAST:  167mL OMNIPAQUE IOHEXOL 300 MG/ML  SOLN COMPARISON:  Same-day radiograph FINDINGS: CT CHEST FINDINGS Cardiovascular: Postsurgical changes related to prior CABG. Extensive calcification of the native coronary arteries. Heart is normal size. No pericardial effusion. Atherosclerotic plaque within the normal caliber aorta. Pulmonary arteries are normal caliber. Mediastinum/Nodes: Small amount stranding in the mediastinum likely related to prior CABG. Subcentimeter hypoattenuating nodule in the left thyroid lobe (2/3). Trachea is patent. Esophagus is unremarkable. Lungs/Pleura: There is a large left pleural effusion with near complete collapse of the left lower lobe and extensive volume loss in the lingula and portions of the left upper lobe as well. There is abrupt tapering of the tracheobronchial tree is inter is into the airless portions of lung. Some ground-glass attenuation is noted adjacent to the fusion likely reflecting passive atelectatic change. In the right lung is clear. No pneumothorax. No visible nodules or suspicious masses though these are not fully excluded in the regions of airless lung.  Musculoskeletal: Postsurgical changes from prior sternotomy with intact and aligned sutures. Multilevel degenerative changes are present in the imaged portions of the spine. Additional degenerative changes in the shoulders. No acute or suspicious osseous lesion. No chest wall abnormality. CT ABDOMEN FINDINGS Hepatobiliary: No focal liver lesion. Few calcified gallstones layer dependently within the gallbladder. No biliary ductal dilatation. Pancreas: Unremarkable. No pancreatic ductal dilatation or surrounding inflammatory changes. Spleen: Normal in size without focal abnormality. Adrenals/Urinary Tract: Normal adrenal glands. Mild bilateral nonspecific perinephric stranding, a nonspecific finding though may correlate with either age or decreased renal function. Lobular appearance of the kidneys may reflect cortical scarring. Kidneys are otherwise unremarkable, without renal calculi, suspicious lesion, or hydronephrosis. Bladder is unremarkable. Stomach/Bowel: Distal esophagus, stomach and duodenal sweep are unremarkable. No bowel wall thickening or dilatation. No evidence of obstruction. A normal appendix is visualized. Scattered colonic diverticula without focal pericolonic inflammation to suggest diverticulitis. Vascular/Lymphatic: Extensive atheromatous plaque throughout the aorta and branch vessels. [place] of the renal arteries are heavily diseased of the branches opacify normally. Normal opacification of the portal venous Other: No abdominopelvic free fluid or free gas. No bowel containing hernias. Small amount of soft tissue infiltration in the low anterior abdomen. Musculoskeletal: Multilevel degenerative changes are present in the imaged portions of the spine. Few remote appearing compression deformities in the lumbar spine at L2, L4 and L5 as well as a limbus vertebrae at L3 could correlate for point tenderness however IMPRESSION: 1. Large left pleural effusion with near complete collapse of the left lower  lobe and extensive volume loss in the lingula and portions of the left upper lobe. Underlying consolidation or lesion cannot be excluded. 2. No acute intra-abdominal process. 3. Cholelithiasis without evidence of acute cholecystitis. 4. Diverticulosis without evidence of acute diverticulitis. 5. Coronary atherosclerosis, post CABG. 6. Bilateral renal cortical scarring 7. Aortic Atherosclerosis (ICD10-I70.0). Electronically Signed   By: Lovena Le M.D.   On: 08/24/2018 00:19   US Thoracentesis Asp Pleural Space W/img Guide  Result Date: 08/24/2018 INDICATION: 63 year old with large left pleural effusion and shortness of breath. EXAM: ULTRASOUND GUIDED LEFT THORACENTESIS MEDICATIONS: None. COMPLICATIONS: None immediate. PROCEDURE: An ultrasound guided thoracentesis was thoroughly discussed  with the patient and questions answered. The benefits, risks, alternatives and complications were also discussed. The patient understands and wishes to proceed with the procedure. Written consent was obtained. Ultrasound was performed to localize and mark an adequate pocket of fluid in the left chest. The area was then prepped and draped in the normal sterile fashion. 1% Lidocaine was used for local anesthesia. Under ultrasound guidance a 6 Fr Safe-T-Centesis catheter was introduced. Thoracentesis was performed. The catheter was removed and a dressing applied. FINDINGS: A total of approximately 1.8 L of red fluid was removed. Samples were sent to the laboratory as requested by the clinical team. IMPRESSION: Successful ultrasound guided left thoracentesis yielding 1.8 L of pleural fluid. Electronically Signed   By: Markus Daft M.D.   On: 08/24/2018 12:29      ASSESSMENT/PLAN   Acute hypoxemic respiratory failure Secondary to large left pleural effusion with compressive atelectasis -Status post thoracentesis with large volume therapeutic and diagnostic tap  -Patient significantly clinically improved with normal vital  signs and satting 99% on room air -Most fluid studies are pending however exudative by LDH thus far -Discussed possibilities to include malignancy as well as inflammatory causes -Have asked RN to request cell block from cytology department   Tobacco abuse Discussed possibility of underlying COPD and increased risk for pulmonary malignancy -Patient understands and will follow-up in clinic in 10 days to evaluate for COPD/emphysema and inhaler optimization as well as any additional work-up necessary for primary lung malignancy    Thank you for allowing me to participate in the care of this patient.    Patient/Family are satisfied with care plan and all questions have been answered.   This document was prepared using Dragon voice recognition software and may include unintentional dictation errors.     Ottie Glazier, M.D.  Division of [facility]

## 2018-08-24 NOTE — ED Notes (Signed)
Discharge instructions, medications, and follow-up care reviewed with pt and spouse prior to discharge. All questions answered. Pt denies any concerns at this time. Pt brought to POV via wheelchair. Discharge in stable condition per MD.

## 2018-08-24 NOTE — ED Notes (Signed)
Patient transported to Ultrasound for thoracentesis

## 2018-08-24 NOTE — ED Notes (Signed)
Er Dr. Marcille Blanco, ok to draw HS trop for 0430 now.

## 2018-08-24 NOTE — Progress Notes (Signed)
*  PRELIMINARY RESULTS* Echocardiogram 2D Echocardiogram has been performed.  Sherrie Sport 08/24/2018, 2:28 PM

## 2018-08-24 NOTE — H&P (Signed)
Craig Koch is an 63 y.o. male.   Chief Complaint: Shortness of breath HPI: The patient with past medical history of CAD status post CABG, hypertension and diabetes presents to the emergency department complaining of shortness of breath.  The patient reports dyspnea on exertion for several weeks.  He is also been coughing up clear frothy sputum.  He denies chest pain, nausea, vomiting or diaphoresis.  He also denies fever. No travel risk factors or known contact with individuals with exposure to novel coronavirus.  Laboratory evaluation revealed mildly elevated troponin.  Chest x-ray also showed large left-sided pleural effusion which is new.  Thus the emergency department staff call hospitalist service for admission.   Past Medical History:  Diagnosis Date  . Anxiety   . Arthritis   . Coronary artery disease   . Diabetes mellitus   . Dyslipidemia   . Hx of CABG   . Hyperlipidemia   . Hypertension     Past Surgical History:  Procedure Laterality Date  . CATARACT EXTRACTION Left 09/2015  . CORONARY ARTERY BYPASS GRAFT     (CABG with LIMA to the  LAD, SVG to OM2/OM3, SVG  to diag  . Left ankle surgery     repair of fracture  . Right lower leg surgery     rod    Family History  Problem Relation Age of Onset  . Heart disease Father   . Colon cancer Neg Hx   . Prostate cancer Neg Hx   . Diabetes Neg Hx    Social History:  reports that he has been smoking cigarettes. He has been smoking about 0.50 packs per day. He has quit using smokeless tobacco.  His smokeless tobacco use included snuff. He reports current alcohol use of about 6.0 standard drinks of alcohol per week. He reports that he does not use drugs.  Allergies:  Allergies  Allergen Reactions  . Lipitor [Atorvastatin Calcium] Other (See Comments)    Aches.  Tolerated crestor.     Prior to Admission medications   Medication Sig Start Date End Date Taking? Authorizing Provider  aspirin 325 MG tablet Take 325 mg by  mouth daily.   Yes [provider]  BD INSULIN SYRINGE U/F 31G X 5/16" 0.5 ML MISC USE DAILY AS DIRECTED. DX E11.9 05/11/18  Yes Tonia Ghent, MD  DULoxetine (CYMBALTA) 30 MG capsule TAKE 1 CAPSULE BY MOUTH EVERY DAY 08/03/18  Yes Tonia Ghent, MD  Fexofenadine HCl [facility] ALLERGY PO) Take 1 tablet by mouth daily as needed.   Yes [provider]  glucose blood (ONE TOUCH ULTRA TEST) test strip USE TO TEST GLUCOSE LEVELS 4 (FOUR) TIMES DAILY. 01/23/18  Yes Renato Shin, MD  HUMALOG 100 UNIT/ML injection INJECT 15 UNITS INTO THE SKIN 3 (THREE) TIMES DAILY WITH MEALS. 03/29/18  Yes Renato Shin, MD  ibuprofen (ADVIL,MOTRIN) 200 MG tablet Take 400 mg by mouth every 6 (six) hours as needed.    Yes [provider]  insulin detemir (LEVEMIR) 100 UNIT/ML injection Inject 0.3 mLs (30 Units total) into the skin at bedtime. 08/03/18  Yes Tonia Ghent, MD  lisinopril (ZESTRIL) 2.5 MG tablet TAKE 1 TABLET BY MOUTH DAILY 04/23/18  Yes Tonia Ghent, MD  metFORMIN (GLUCOPHAGE) 500 MG tablet TAKE 1 TABLET (500 MG TOTAL) BY MOUTH 2 (TWO) TIMES DAILY WITH A MEAL. 05/18/18  Yes Tonia Ghent, MD  metoprolol tartrate (LOPRESSOR) 100 MG tablet TAKE 0.5 TABLETS (50 MG TOTAL) BY MOUTH  2 (TWO) TIMES DAILY. 05/18/18  Yes Tonia Ghent, MD  Omega-3 Fatty Acids (FISH OIL) 1200 MG CAPS Take 2 capsules by mouth 3 (three) times daily with meals.   Yes [provider]  rosuvastatin (CRESTOR) 10 MG tablet TAKE 1 TABLET (10 MG TOTAL) BY MOUTH DAILY. 10/10/17  Yes Tonia Ghent, MD  aspirin EC 81 MG tablet Take 1 tablet (81 mg total) by mouth daily. Patient not taking: Reported on 08/24/2018 09/25/17   Minna Merritts, MD  sildenafil (REVATIO) 20 MG tablet Take 1 tablet (20 mg total) by mouth 3 (three) times daily as needed. Patient not taking: Reported on 08/24/2018 09/25/17   Minna Merritts, MD     Results for orders placed or performed during the hospital encounter of  08/23/18 (from the past 48 hour(s))  Basic metabolic panel     Status: Abnormal   Collection Time: 08/23/18  6:48 PM  Result Value Ref Range   Sodium 137 135 - 145 mmol/L   Potassium 3.9 3.5 - 5.1 mmol/L   Chloride 103 98 - 111 mmol/L   CO2 26 22 - 32 mmol/L   Glucose, Bld 185 (H) 70 - 99 mg/dL   BUN 18 8 - 23 mg/dL   Creatinine, Ser 0.88 0.61 - 1.24 mg/dL   Calcium 9.1 8.9 - 10.3 mg/dL   GFR calc non Af Amer >60 >60 mL/min   GFR calc Af Amer >60 >60 mL/min   Anion gap 8 5 - 15    Comment: Performed at [facility], [address] [place], [place] [address]  CBC     Status: None   Collection Time: 08/23/18  6:48 PM  Result Value Ref Range   WBC 7.3 4.0 - 10.5 K/uL   RBC 4.44 4.22 - 5.81 MIL/uL   Hemoglobin 14.5 13.0 - 17.0 g/dL   HCT 42.2 39.0 - 52.0 %   MCV 95.0 80.0 - 100.0 fL   MCH 32.7 26.0 - 34.0 pg   MCHC 34.4 30.0 - 36.0 g/dL   RDW 13.1 11.5 - 15.5 %   Platelets 279 150 - 400 K/uL   nRBC 0.0 0.0 - 0.2 %    Comment: Performed at [facility], [place], [place] [address]  Troponin I (High Sensitivity)     Status: Abnormal   Collection Time: 08/23/18  6:48 PM  Result Value Ref Range   Troponin I (High Sensitivity) 18 (H) <18 ng/L    Comment: (NOTE) Elevated high sensitivity troponin I (hsTnI) values and significant  changes across serial measurements may suggest ACS but many other  chronic and acute conditions are known to elevate hsTnI results.  Refer to the "Links" section for chest pain algorithms and additional  guidance. Performed at [facility], [place], [place] [address]   Troponin I (High Sensitivity)     Status: Abnormal   Collection Time: 08/23/18 11:35 PM  Result Value Ref Range   Troponin I (High Sensitivity) 18 (H) <18 ng/L    Comment: (NOTE) Elevated high sensitivity troponin I (hsTnI) values and significant  changes across serial measurements may suggest ACS but many other  chronic and  acute conditions are known to elevate hsTnI results.  Refer to the "Links" section for chest pain algorithms and additional  guidance. Performed at [facility], [address] [place],  [address]   Lipase, blood     Status: None   Collection Time: 08/23/18  11:35 PM  Result Value Ref Range   Lipase 14 11 - 51 U/L    Comment: Performed at [facility], [address] [place], [place] [address]  SARS Coronavirus 2 [facility] order, Performed in [facility] hospital lab) Nasopharyngeal Nasopharyngeal Swab     Status: None   Collection Time: 08/23/18 11:36 PM   Specimen: Nasopharyngeal Swab  Result Value Ref Range   SARS Coronavirus 2 NEGATIVE NEGATIVE    Comment: (NOTE) If result is NEGATIVE SARS-CoV-2 target nucleic acids are NOT DETECTED. The SARS-CoV-2 RNA is generally detectable in upper and lower  respiratory specimens during the acute phase of infection. The lowest  concentration of SARS-CoV-2 viral copies this assay can detect is 250  copies / mL. A negative result does not preclude SARS-CoV-2 infection  and should not be used as the sole basis for treatment or other  patient management decisions.  A negative result may occur with  improper specimen collection / handling, submission of specimen other  than nasopharyngeal swab, presence of viral mutation(s) within the  areas targeted by this assay, and inadequate number of viral copies  (<250 copies / mL). A negative result must be combined with clinical  observations, patient history, and epidemiological information. If result is POSITIVE SARS-CoV-2 target nucleic acids are DETECTED. The SARS-CoV-2 RNA is generally detectable in upper and lower  respiratory specimens dur ing the acute phase of infection.  Positive  results are indicative of active infection with SARS-CoV-2.  Clinical  correlation with patient history and other diagnostic information is  necessary to determine patient infection  status.  Positive results do  not rule out bacterial infection or co-infection with other viruses. If result is PRESUMPTIVE POSTIVE SARS-CoV-2 nucleic acids MAY BE PRESENT.   A presumptive positive result was obtained on the submitted specimen  and confirmed on repeat testing.  While 2019 novel coronavirus  (SARS-CoV-2) nucleic acids may be present in the submitted sample  additional confirmatory testing may be necessary for epidemiological  and / or clinical management purposes  to differentiate between  SARS-CoV-2 and other Sarbecovirus currently known to infect humans.  If clinically indicated additional testing with an alternate test  methodology [phone]) is advised. The SARS-CoV-2 RNA is generally  detectable in upper and lower respiratory sp ecimens during the acute  phase of infection. The expected result is Negative. Fact Sheet for Patients:  StrictlyIdeas.no Fact Sheet for Healthcare Providers: BankingDealers.co.za This test is not yet approved or cleared by the [place] FDA and has been authorized for detection and/or diagnosis of SARS-CoV-2 by FDA under an Emergency Use Authorization (EUA).  This EUA will remain in effect (meaning this test can be used) for the duration of the COVID-19 declaration under Section 564(b)(1) of the Act, 21 U.S.C. section 360bbb-3(b)(1), unless the authorization is terminated or revoked sooner. Performed at [facility], [place], [place] [address]   Troponin I (High Sensitivity)     Status: Abnormal   Collection Time: 08/24/18  1:26 AM  Result Value Ref Range   Troponin I (High Sensitivity) 19 (H) <18 ng/L    Comment: (NOTE) Elevated high sensitivity troponin I (hsTnI) values and significant  changes across serial measurements may suggest ACS but many other  chronic and acute conditions are known to elevate hsTnI results.  Refer to the "Links" section for chest  pain algorithms and additional  guidance. Performed at [facility], [address], [place], [place] [address]   TSH  Status: None   Collection Time: 08/24/18  1:26 AM  Result Value Ref Range   TSH 2.029 0.350 - 4.500 uIU/mL    Comment: Performed by a 3rd Generation assay with a functional sensitivity of <=0.01 uIU/mL. Performed at [facility], [address] [place], [place] [address]    Dg Chest 2 View  Result Date: 08/23/2018 CLINICAL DATA:  Shortness of breath 2 weeks with productive cough. EXAM: CHEST - 2 VIEW COMPARISON:  None. FINDINGS: There is moderate opacification over the lower half of the left lung likely large effusion with associated basilar atelectasis. Fluid tracks into the apex. Right lung is clear. Left heart border is obscured by the large left effusion. Visualized mediastinum is unremarkable. Degenerative change of the spine. IMPRESSION: Large left pleural effusion likely with associated basilar atelectasis. Electronically Signed   By: Marin Olp M.D.   On: 08/23/2018 19:13   Ct Chest W Contrast  Result Date: 08/24/2018 CLINICAL DATA:  Chest pain, shortness of breath, fusion suspected. Smoker, approximately 25 pack years EXAM: CT CHEST AND ABDOMEN WITH CONTRAST TECHNIQUE: Multidetector CT imaging of the chest and abdomen was performed following the standard protocol during bolus administration of intravenous contrast. CONTRAST:  161mL OMNIPAQUE IOHEXOL 300 MG/ML  SOLN COMPARISON:  Same-day radiograph FINDINGS: CT CHEST FINDINGS Cardiovascular: Postsurgical changes related to prior CABG. Extensive calcification of the native coronary arteries. Heart is normal size. No pericardial effusion. Atherosclerotic plaque within the normal caliber aorta. Pulmonary arteries are normal caliber. Mediastinum/Nodes: Small amount stranding in the mediastinum likely related to prior CABG. Subcentimeter hypoattenuating nodule in the left thyroid lobe (2/3). Trachea  is patent. Esophagus is unremarkable. Lungs/Pleura: There is a large left pleural effusion with near complete collapse of the left lower lobe and extensive volume loss in the lingula and portions of the left upper lobe as well. There is abrupt tapering of the tracheobronchial tree is inter is into the airless portions of lung. Some ground-glass attenuation is noted adjacent to the fusion likely reflecting passive atelectatic change. In the right lung is clear. No pneumothorax. No visible nodules or suspicious masses though these are not fully excluded in the regions of airless lung. Musculoskeletal: Postsurgical changes from prior sternotomy with intact and aligned sutures. Multilevel degenerative changes are present in the imaged portions of the spine. Additional degenerative changes in the shoulders. No acute or suspicious osseous lesion. No chest wall abnormality. CT ABDOMEN FINDINGS Hepatobiliary: No focal liver lesion. Few calcified gallstones layer dependently within the gallbladder. No biliary ductal dilatation. Pancreas: Unremarkable. No pancreatic ductal dilatation or surrounding inflammatory changes. Spleen: Normal in size without focal abnormality. Adrenals/Urinary Tract: Normal adrenal glands. Mild bilateral nonspecific perinephric stranding, a nonspecific finding though may correlate with either age or decreased renal function. Lobular appearance of the kidneys may reflect cortical scarring. Kidneys are otherwise unremarkable, without renal calculi, suspicious lesion, or hydronephrosis. Bladder is unremarkable. Stomach/Bowel: Distal esophagus, stomach and duodenal sweep are unremarkable. No bowel wall thickening or dilatation. No evidence of obstruction. A normal appendix is visualized. Scattered colonic diverticula without focal pericolonic inflammation to suggest diverticulitis. Vascular/Lymphatic: Extensive atheromatous plaque throughout the aorta and branch vessels. [place] of the renal arteries are  heavily diseased of the branches opacify normally. Normal opacification of the portal venous Other: No abdominopelvic free fluid or free gas. No bowel containing hernias. Small amount of soft tissue infiltration in the low anterior abdomen. Musculoskeletal: Multilevel degenerative changes are present in the imaged portions of the spine. Few remote appearing  compression deformities in the lumbar spine at L2, L4 and L5 as well as a limbus vertebrae at L3 could correlate for point tenderness however IMPRESSION: 1. Large left pleural effusion with near complete collapse of the left lower lobe and extensive volume loss in the lingula and portions of the left upper lobe. Underlying consolidation or lesion cannot be excluded. 2. No acute intra-abdominal process. 3. Cholelithiasis without evidence of acute cholecystitis. 4. Diverticulosis without evidence of acute diverticulitis. 5. Coronary atherosclerosis, post CABG. 6. Bilateral renal cortical scarring 7. Aortic Atherosclerosis (ICD10-I70.0). Electronically Signed   By: Lovena Le M.D.   On: 08/24/2018 00:19   Ct Abdomen Pelvis W Contrast  Result Date: 08/24/2018 CLINICAL DATA:  Chest pain, shortness of breath, fusion suspected. Smoker, approximately 25 pack years EXAM: CT CHEST AND ABDOMEN WITH CONTRAST TECHNIQUE: Multidetector CT imaging of the chest and abdomen was performed following the standard protocol during bolus administration of intravenous contrast. CONTRAST:  141mL OMNIPAQUE IOHEXOL 300 MG/ML  SOLN COMPARISON:  Same-day radiograph FINDINGS: CT CHEST FINDINGS Cardiovascular: Postsurgical changes related to prior CABG. Extensive calcification of the native coronary arteries. Heart is normal size. No pericardial effusion. Atherosclerotic plaque within the normal caliber aorta. Pulmonary arteries are normal caliber. Mediastinum/Nodes: Small amount stranding in the mediastinum likely related to prior CABG. Subcentimeter hypoattenuating nodule in the left  thyroid lobe (2/3). Trachea is patent. Esophagus is unremarkable. Lungs/Pleura: There is a large left pleural effusion with near complete collapse of the left lower lobe and extensive volume loss in the lingula and portions of the left upper lobe as well. There is abrupt tapering of the tracheobronchial tree is inter is into the airless portions of lung. Some ground-glass attenuation is noted adjacent to the fusion likely reflecting passive atelectatic change. In the right lung is clear. No pneumothorax. No visible nodules or suspicious masses though these are not fully excluded in the regions of airless lung. Musculoskeletal: Postsurgical changes from prior sternotomy with intact and aligned sutures. Multilevel degenerative changes are present in the imaged portions of the spine. Additional degenerative changes in the shoulders. No acute or suspicious osseous lesion. No chest wall abnormality. CT ABDOMEN FINDINGS Hepatobiliary: No focal liver lesion. Few calcified gallstones layer dependently within the gallbladder. No biliary ductal dilatation. Pancreas: Unremarkable. No pancreatic ductal dilatation or surrounding inflammatory changes. Spleen: Normal in size without focal abnormality. Adrenals/Urinary Tract: Normal adrenal glands. Mild bilateral nonspecific perinephric stranding, a nonspecific finding though may correlate with either age or decreased renal function. Lobular appearance of the kidneys may reflect cortical scarring. Kidneys are otherwise unremarkable, without renal calculi, suspicious lesion, or hydronephrosis. Bladder is unremarkable. Stomach/Bowel: Distal esophagus, stomach and duodenal sweep are unremarkable. No bowel wall thickening or dilatation. No evidence of obstruction. A normal appendix is visualized. Scattered colonic diverticula without focal pericolonic inflammation to suggest diverticulitis. Vascular/Lymphatic: Extensive atheromatous plaque throughout the aorta and branch vessels. [place]  of the renal arteries are heavily diseased of the branches opacify normally. Normal opacification of the portal venous Other: No abdominopelvic free fluid or free gas. No bowel containing hernias. Small amount of soft tissue infiltration in the low anterior abdomen. Musculoskeletal: Multilevel degenerative changes are present in the imaged portions of the spine. Few remote appearing compression deformities in the lumbar spine at L2, L4 and L5 as well as a limbus vertebrae at L3 could correlate for point tenderness however IMPRESSION: 1. Large left pleural effusion with near complete collapse of the left lower lobe and extensive volume loss in  the lingula and portions of the left upper lobe. Underlying consolidation or lesion cannot be excluded. 2. No acute intra-abdominal process. 3. Cholelithiasis without evidence of acute cholecystitis. 4. Diverticulosis without evidence of acute diverticulitis. 5. Coronary atherosclerosis, post CABG. 6. Bilateral renal cortical scarring 7. Aortic Atherosclerosis (ICD10-I70.0). Electronically Signed   By: Lovena Le M.D.   On: 08/24/2018 00:19    Review of Systems  Constitutional: Negative for chills and fever.  HENT: Negative for sore throat and tinnitus.   Eyes: Negative for blurred vision and redness.  Respiratory: Positive for cough, sputum production (clear, frothy) and shortness of breath.   Cardiovascular: Negative for chest pain, palpitations, orthopnea, leg swelling and PND.  Gastrointestinal: Negative for abdominal pain, diarrhea, nausea and vomiting.  Genitourinary: Negative for dysuria, frequency and urgency.  Musculoskeletal: Negative for joint pain and myalgias.  Skin: Negative for rash.       No lesions  Neurological: Negative for speech change, focal weakness and weakness.  Endo/Heme/Allergies: Does not bruise/bleed easily.       No temperature intolerance  Psychiatric/Behavioral: Negative for depression and suicidal ideas.    Blood pressure  (!) 145/85, pulse 95, temperature 98.4 F (36.9 C), temperature source Oral, resp. rate 18, height 5' 11.5" (1.816 m), weight 99.8 kg, SpO2 100 %. Physical Exam  Vitals reviewed. Constitutional: He is oriented to person, place, and time. He appears well-developed and well-nourished. No distress.  HENT:  Head: Normocephalic and atraumatic.  Mouth/Throat: Oropharynx is clear and moist.  Eyes: Pupils are equal, round, and reactive to light. Conjunctivae and EOM are normal. No scleral icterus.  Neck: Normal range of motion. Neck supple. No JVD present. No tracheal deviation present. No thyromegaly present.  Cardiovascular: Normal rate, regular rhythm and normal heart sounds. Exam reveals no gallop and no friction rub.  No murmur heard. Respiratory: Effort normal and breath sounds normal. No respiratory distress.  GI: Soft. Bowel sounds are normal. He exhibits no distension. There is no abdominal tenderness.  Genitourinary:    Genitourinary Comments: Deferred   Musculoskeletal: Normal range of motion.        General: No edema.  Lymphadenopathy:    He has no cervical adenopathy.  Neurological: He is alert and oriented to person, place, and time. No cranial nerve deficit.  Skin: Skin is warm and dry. No rash noted. No erythema.  Psychiatric: He has a normal mood and affect. His behavior is normal. Judgment and thought content normal.     Assessment/Plan This is a 63 year old male admitted for elevated troponin. 1.  Elevated troponin: Secondary to demand ischemia.  EKG computerized interpretation reports new anterior infarct as well as borderline criteria for inferior infarct of indeterminate age.  The patient has not had any chest pain, although he does have diabetes which makes the likelihood of autonomic neuropathy/silent heart attack possible.  Continue to follow high-sensitivity troponin.  Monitor telemetry.  Consult cardiology. 2.  CAD: Continue aspirin.  Plan as above. 3.  Pleural  effusion: Left side; large.  I have made the patient n.p.o. in anticipation of thoracentesis.  The patient does not have lower extremity edema.  I have held off on Lasix for now.  Supplemental oxygen as needed. 4.  Hypertension: Uncontrolled; continue lisinopril and metoprolol.  Labetalol as needed. 5.  Diabetes mellitus type 2: Continue basal insulin therapy in addition to sliding scale insulin while hospitalized.  Hold metformin.  Continue Cymbalta for neuropathy 6.  Hyperlipidemia: Continue statin therapy 7.  DVT prophylaxis: Lovenox  8.  GI prophylaxis: None The patient is a full code.  Time spent on admission orders and patient care approximately 45 minutes  Harrie Foreman, MD 08/24/2018, 7:04 AM

## 2018-08-24 NOTE — ED Notes (Signed)
Pt returned from US

## 2018-08-24 NOTE — Procedures (Signed)
Interventional Radiology Procedure:   Indications: Shortness of breath and large left pleural effusion.  Procedure: US guided thoracentesis  Findings: Removed 1800 ml from left chest.  Complications: None     EBL: Less than 10 ml  Plan: Follow up CXR   Craig Chavira R. Anselm Pancoast, MD  Pager: [phone]

## 2018-08-24 NOTE — ED Notes (Signed)
Pt ambulated to and from bathroom in hallway with no difficulty. Walking O2 sat >94%. Pt denies dyspnea. MD [facility] notified.

## 2018-08-24 NOTE — Consult Note (Signed)
Cardiology Consultation:   Patient ID: Craig Koch MRN: [id number]; DOB: Sep 05, 1955  Admit date: 08/23/2018 Date of Consult: 08/24/2018  Primary Care Provider: Tonia Ghent, MD Primary Cardiologist:Dr. Rockey Situ Primary Electrophysiologist:  None    Patient Profile:   Craig Koch is a 63 y.o. male with a hx of CAD, CABG x5 in 2004 (LIMA-LAD, SVG-OM2/OM3, SVG-Diag), poorly controlled DM 2, current smoker, hypertension, hyperlipidemia, and depression who is being seen today for the evaluation of shortness of breath at the request of Dr. Marcille Blanco.  History of Present Illness:   Craig Koch is a 63 year old male with PMH as above.  He has history of 2010 stress test with EF 45% and fixed defect involving the inferior wall, most consistent with scar versus hibernating myocardium. Per patient, he also had a stress test in 2012 ruled normal. He is a current smoker at 1/2 pack daily.   Seen in the office 09/25/2017.  At that time, patient denied chest pain.  Recommendations were to continue current medication regimen.  No further ischemic work-up was needed at that time.  Weight loss, dietary changes, and smoking cessation were advised.  Patient continues to smoke 1/2 pk daily.  On 08/24/2018, the patient presented to [facility] emergency department with shortness of breath and dyspnea on exertion for several weeks.  He had reportedly been coughing up clear and frothy sputum.  No chest pain, diaphoresis, nausea, or emesis. In the ED, labs showed HS Tn negative and flat trending at 18  18  19  24  (0.02), which is not consistent with ACS. EKG without acute changes from previous. Patient continued to deny chest pain.  CTA of the chest showed large left-sided pleural effusion with near complete collapse of the left lower lobe and extensive volume loss in the lingula and portions of the left upper lobe.  Underlying consolidation or lesion could not be excluded.  No acute intra-abdominal process.   Cholelithiasis and diverticulosis.  Coronary atherosclerosis s/p CABG.  Bilateral renal cortical scarring, and aortic atherosclerosis.  Thoracentesis was performed with a total of approximately 1.8 L of exudative fluid by LDH removed with normal fluid glucose. Cytology pending. Pulmonology and cardiology consulted with patient request to be discharged home and have pulmonology outpatient follow-up in 10 days.     Heart Pathway Score:     Past Medical History:  Diagnosis Date   Anxiety    Arthritis    Coronary artery disease    Diabetes mellitus    Dyslipidemia    Hx of CABG    Hyperlipidemia    Hypertension     Past Surgical History:  Procedure Laterality Date   CATARACT EXTRACTION Left 09/2015   CORONARY ARTERY BYPASS GRAFT     (CABG with LIMA to the  LAD, SVG to OM2/OM3, SVG  to diag   Left ankle surgery     repair of fracture   Right lower leg surgery     rod     Home Medications:  Prior to Admission medications   Medication Sig Start Date End Date Taking? Authorizing Provider  aspirin 325 MG tablet Take 325 mg by mouth daily.   Yes [provider]  BD INSULIN SYRINGE U/F 31G X 5/16" 0.5 ML MISC USE DAILY AS DIRECTED. DX E11.9 05/11/18  Yes Tonia Ghent, MD  DULoxetine (CYMBALTA) 30 MG capsule TAKE 1 CAPSULE BY MOUTH EVERY DAY 08/03/18  Yes Tonia Ghent, MD  Fexofenadine HCl [facility] ALLERGY PO) Take 1 tablet by  mouth daily as needed.   Yes [provider]  glucose blood (ONE TOUCH ULTRA TEST) test strip USE TO TEST GLUCOSE LEVELS 4 (FOUR) TIMES DAILY. 01/23/18  Yes Renato Shin, MD  HUMALOG 100 UNIT/ML injection INJECT 15 UNITS INTO THE SKIN 3 (THREE) TIMES DAILY WITH MEALS. 03/29/18  Yes Renato Shin, MD  ibuprofen (ADVIL,MOTRIN) 200 MG tablet Take 400 mg by mouth every 6 (six) hours as needed.    Yes [provider]  insulin detemir (LEVEMIR) 100 UNIT/ML injection Inject 0.3 mLs (30 Units total) into the skin at bedtime. 08/03/18   Yes Tonia Ghent, MD  lisinopril (ZESTRIL) 2.5 MG tablet TAKE 1 TABLET BY MOUTH DAILY 04/23/18  Yes Tonia Ghent, MD  metFORMIN (GLUCOPHAGE) 500 MG tablet TAKE 1 TABLET (500 MG TOTAL) BY MOUTH 2 (TWO) TIMES DAILY WITH A MEAL. 05/18/18  Yes Tonia Ghent, MD  metoprolol tartrate (LOPRESSOR) 100 MG tablet TAKE 0.5 TABLETS (50 MG TOTAL) BY MOUTH 2 (TWO) TIMES DAILY. 05/18/18  Yes Tonia Ghent, MD  Omega-3 Fatty Acids (FISH OIL) 1200 MG CAPS Take 2 capsules by mouth 3 (three) times daily with meals.   Yes [provider]  rosuvastatin (CRESTOR) 10 MG tablet TAKE 1 TABLET (10 MG TOTAL) BY MOUTH DAILY. 10/10/17  Yes Tonia Ghent, MD  albuterol (VENTOLIN HFA) 108 (90 Base) MCG/ACT inhaler Inhale 2 puffs into the lungs every 6 (six) hours as needed for wheezing or shortness of breath. 08/24/18   Loletha Grayer, MD  doxycycline (VIBRA-TABS) 100 MG tablet Take 1 tablet (100 mg total) by mouth every 12 (twelve) hours. 08/24/18   Loletha Grayer, MD  furosemide (LASIX) 20 MG tablet Take 1 tablet (20 mg total) by mouth daily. 08/24/18 08/24/19  Loletha Grayer, MD    Inpatient Medications: Scheduled Meds:  aspirin EC  325 mg Oral Daily   docusate sodium  100 mg Oral BID   DULoxetine  30 mg Oral Daily   insulin aspart  0-15 Units Subcutaneous TID WC   insulin detemir  18 Units Subcutaneous QHS   lisinopril  2.5 mg Oral q1800   metoprolol tartrate  50 mg Oral BID   omega-3 acid ethyl esters  2 g Oral BID   rosuvastatin  10 mg Oral q1800   Continuous Infusions:  PRN Meds: acetaminophen **OR** acetaminophen, fexofenadine, labetalol, ondansetron **OR** ondansetron (ZOFRAN) IV  Allergies:    Allergies  Allergen Reactions   Lipitor [Atorvastatin Calcium] Other (See Comments)    Aches.  Tolerated crestor.     Social History:   Social History   Socioeconomic History   Marital status: Married    Spouse name: Not on file   Number of children: Not on file   Years  of education: Not on file   Highest education level: Not on file  Occupational History   Not on file  Social Needs   Financial resource strain: Not on file   Food insecurity    Worry: Not on file    Inability: Not on file   Transportation needs    Medical: Not on file    Non-medical: Not on file  Tobacco Use   Smoking status: Current Every Day Smoker    Packs/day: 0.50    Types: Cigarettes    Last attempt to quit: 07/06/2013    Years since quitting: 5.1   Smokeless tobacco: Former Systems developer    Types: Snuff  Substance and Sexual Activity   Alcohol use: Yes  Alcohol/week: 6.0 standard drinks    Types: 6 Cans of beer per week    Comment: occ, average 6 pack in a week   Drug use: No   Sexual activity: Never  Lifestyle   Physical activity    Days per week: Not on file    Minutes per session: Not on file   Stress: Not on file  Relationships   Social connections    Talks on phone: Not on file    Gets together: Not on file    Attends religious service: Not on file    Active member of club or organization: Not on file    Attends meetings of clubs or organizations: Not on file    Relationship status: Not on file   Intimate partner violence    Fear of current or ex partner: Not on file    Emotionally abused: Not on file    Physically abused: Not on file    Forced sexual activity: Not on file  Other Topics Concern   Not on file  Social History Narrative   On disability 2009 after prev injuries and CAD.     Married 1976   2 kids, 4 grandkids.     Family History:    Family History  Problem Relation Age of Onset   Heart disease Father    Colon cancer Neg Hx    Prostate cancer Neg Hx    Diabetes Neg Hx      ROS:  Please see the history of present illness.  Review of Systems  Constitutional: Negative for diaphoresis.  Respiratory: Positive for cough, sputum production and shortness of breath.   Cardiovascular: Negative for chest pain, palpitations and  leg swelling.  Gastrointestinal: Negative for nausea and vomiting.  All other systems reviewed and are negative.   All other ROS reviewed and negative.     Physical Exam/Data:   Vitals:   08/24/18 0853 08/24/18 1000 08/24/18 1040 08/24/18 1136  BP: 139/72 (!) 151/77 (!) 158/78 116/65  Pulse: (!) 102 (!) 106 (!) 113 (!) 106  Resp: 20 20 18 18   Temp:  98.1 F (36.7 C)    TempSrc:  Oral    SpO2: 99% 98% 98% 98%  Weight:      Height:       No intake or output data in the 24 hours ending 08/24/18 1255 Last 3 Weights 08/23/2018 08/03/2018 09/25/2017  Weight (lbs) 220 lb 216 lb 2 oz 230 lb 8 oz  Weight (kg) 99.791 kg 98.034 kg 104.554 kg     Body mass index is 30.26 kg/m.  General:  Well nourished, well developed, in no acute distress HEENT: normal Neck: no JVD Vascular: No carotid bruits; radial pulses 2+ bilaterally Cardiac:  normal S1, S2; tachycardic but regular, extrasystole appreciated; no murmur  Lungs: Clear on R, reduced on L Abd: soft, nontender, no hepatomegaly  Ext: no edema Musculoskeletal:  No deformities, BUE and BLE strength normal and equal Skin: warm and dry  Neuro:  No focal abnormalities noted Psych:  Normal affect   EKG:  The EKG was personally reviewed and demonstrates:  EKG normal sinus rhythm, 93 bpm, IVCD, nonspecific ST abn, consider old anterior / inferior MI, no acute changes from baseline / previous EKG as read 09/25/2017 Telemetry:  Telemetry was personally reviewed and demonstrates:  SR-ST, mostly sinus tachycardia at ~100bpm with PVCs  Relevant CV Studies: Pending echo, today  Laboratory Data:  High Sensitivity Troponin:   Recent Labs  Lab  08/23/18 1848 08/23/18 2335 08/24/18 0126 08/24/18 0801  TROPONINIHS 18* 18* 19* 24*     Cardiac EnzymesNo results for input(s): TROPONINI in the last 168 hours. No results for input(s): TROPIPOC in the last 168 hours.  Chemistry Recent Labs  Lab 08/23/18 1848  NA 137  K 3.9  CL 103  CO2 26    GLUCOSE 185*  BUN 18  CREATININE 0.88  CALCIUM 9.1  GFRNONAA >60  GFRAA >60  ANIONGAP 8    No results for input(s): PROT, ALBUMIN, AST, ALT, ALKPHOS, BILITOT in the last 168 hours. Hematology Recent Labs  Lab 08/23/18 1848  WBC 7.3  RBC 4.44  HGB 14.5  HCT 42.2  MCV 95.0  MCH 32.7  MCHC 34.4  RDW 13.1  PLT 279   BNPNo results for input(s): BNP, PROBNP in the last 168 hours.  DDimer No results for input(s): DDIMER in the last 168 hours.   Radiology/Studies:  Dg Chest 2 View  Result Date: 08/23/2018 CLINICAL DATA:  Shortness of breath 2 weeks with productive cough. EXAM: CHEST - 2 VIEW COMPARISON:  None. FINDINGS: There is moderate opacification over the lower half of the left lung likely large effusion with associated basilar atelectasis. Fluid tracks into the apex. Right lung is clear. Left heart border is obscured by the large left effusion. Visualized mediastinum is unremarkable. Degenerative change of the spine. IMPRESSION: Large left pleural effusion likely with associated basilar atelectasis. Electronically Signed   By: Marin Olp M.D.   On: 08/23/2018 19:13   Ct Chest W Contrast  Result Date: 08/24/2018 CLINICAL DATA:  Chest pain, shortness of breath, fusion suspected. Smoker, approximately 25 pack years EXAM: CT CHEST AND ABDOMEN WITH CONTRAST TECHNIQUE: Multidetector CT imaging of the chest and abdomen was performed following the standard protocol during bolus administration of intravenous contrast. CONTRAST:  148mL OMNIPAQUE IOHEXOL 300 MG/ML  SOLN COMPARISON:  Same-day radiograph FINDINGS: CT CHEST FINDINGS Cardiovascular: Postsurgical changes related to prior CABG. Extensive calcification of the native coronary arteries. Heart is normal size. No pericardial effusion. Atherosclerotic plaque within the normal caliber aorta. Pulmonary arteries are normal caliber. Mediastinum/Nodes: Small amount stranding in the mediastinum likely related to prior CABG. Subcentimeter  hypoattenuating nodule in the left thyroid lobe (2/3). Trachea is patent. Esophagus is unremarkable. Lungs/Pleura: There is a large left pleural effusion with near complete collapse of the left lower lobe and extensive volume loss in the lingula and portions of the left upper lobe as well. There is abrupt tapering of the tracheobronchial tree is inter is into the airless portions of lung. Some ground-glass attenuation is noted adjacent to the fusion likely reflecting passive atelectatic change. In the right lung is clear. No pneumothorax. No visible nodules or suspicious masses though these are not fully excluded in the regions of airless lung. Musculoskeletal: Postsurgical changes from prior sternotomy with intact and aligned sutures. Multilevel degenerative changes are present in the imaged portions of the spine. Additional degenerative changes in the shoulders. No acute or suspicious osseous lesion. No chest wall abnormality. CT ABDOMEN FINDINGS Hepatobiliary: No focal liver lesion. Few calcified gallstones layer dependently within the gallbladder. No biliary ductal dilatation. Pancreas: Unremarkable. No pancreatic ductal dilatation or surrounding inflammatory changes. Spleen: Normal in size without focal abnormality. Adrenals/Urinary Tract: Normal adrenal glands. Mild bilateral nonspecific perinephric stranding, a nonspecific finding though may correlate with either age or decreased renal function. Lobular appearance of the kidneys may reflect cortical scarring. Kidneys are otherwise unremarkable, without renal calculi, suspicious lesion,  or hydronephrosis. Bladder is unremarkable. Stomach/Bowel: Distal esophagus, stomach and duodenal sweep are unremarkable. No bowel wall thickening or dilatation. No evidence of obstruction. A normal appendix is visualized. Scattered colonic diverticula without focal pericolonic inflammation to suggest diverticulitis. Vascular/Lymphatic: Extensive atheromatous plaque throughout  the aorta and branch vessels. [place] of the renal arteries are heavily diseased of the branches opacify normally. Normal opacification of the portal venous Other: No abdominopelvic free fluid or free gas. No bowel containing hernias. Small amount of soft tissue infiltration in the low anterior abdomen. Musculoskeletal: Multilevel degenerative changes are present in the imaged portions of the spine. Few remote appearing compression deformities in the lumbar spine at L2, L4 and L5 as well as a limbus vertebrae at L3 could correlate for point tenderness however IMPRESSION: 1. Large left pleural effusion with near complete collapse of the left lower lobe and extensive volume loss in the lingula and portions of the left upper lobe. Underlying consolidation or lesion cannot be excluded. 2. No acute intra-abdominal process. 3. Cholelithiasis without evidence of acute cholecystitis. 4. Diverticulosis without evidence of acute diverticulitis. 5. Coronary atherosclerosis, post CABG. 6. Bilateral renal cortical scarring 7. Aortic Atherosclerosis (ICD10-I70.0). Electronically Signed   By: Lovena Le M.D.   On: 08/24/2018 00:19   Ct Abdomen Pelvis W Contrast  Result Date: 08/24/2018 CLINICAL DATA:  Chest pain, shortness of breath, fusion suspected. Smoker, approximately 25 pack years EXAM: CT CHEST AND ABDOMEN WITH CONTRAST TECHNIQUE: Multidetector CT imaging of the chest and abdomen was performed following the standard protocol during bolus administration of intravenous contrast. CONTRAST:  159mL OMNIPAQUE IOHEXOL 300 MG/ML  SOLN COMPARISON:  Same-day radiograph FINDINGS: CT CHEST FINDINGS Cardiovascular: Postsurgical changes related to prior CABG. Extensive calcification of the native coronary arteries. Heart is normal size. No pericardial effusion. Atherosclerotic plaque within the normal caliber aorta. Pulmonary arteries are normal caliber. Mediastinum/Nodes: Small amount stranding in the mediastinum likely related to  prior CABG. Subcentimeter hypoattenuating nodule in the left thyroid lobe (2/3). Trachea is patent. Esophagus is unremarkable. Lungs/Pleura: There is a large left pleural effusion with near complete collapse of the left lower lobe and extensive volume loss in the lingula and portions of the left upper lobe as well. There is abrupt tapering of the tracheobronchial tree is inter is into the airless portions of lung. Some ground-glass attenuation is noted adjacent to the fusion likely reflecting passive atelectatic change. In the right lung is clear. No pneumothorax. No visible nodules or suspicious masses though these are not fully excluded in the regions of airless lung. Musculoskeletal: Postsurgical changes from prior sternotomy with intact and aligned sutures. Multilevel degenerative changes are present in the imaged portions of the spine. Additional degenerative changes in the shoulders. No acute or suspicious osseous lesion. No chest wall abnormality. CT ABDOMEN FINDINGS Hepatobiliary: No focal liver lesion. Few calcified gallstones layer dependently within the gallbladder. No biliary ductal dilatation. Pancreas: Unremarkable. No pancreatic ductal dilatation or surrounding inflammatory changes. Spleen: Normal in size without focal abnormality. Adrenals/Urinary Tract: Normal adrenal glands. Mild bilateral nonspecific perinephric stranding, a nonspecific finding though may correlate with either age or decreased renal function. Lobular appearance of the kidneys may reflect cortical scarring. Kidneys are otherwise unremarkable, without renal calculi, suspicious lesion, or hydronephrosis. Bladder is unremarkable. Stomach/Bowel: Distal esophagus, stomach and duodenal sweep are unremarkable. No bowel wall thickening or dilatation. No evidence of obstruction. A normal appendix is visualized. Scattered colonic diverticula without focal pericolonic inflammation to suggest diverticulitis. Vascular/Lymphatic: Extensive  atheromatous plaque throughout  the aorta and branch vessels. [place] of the renal arteries are heavily diseased of the branches opacify normally. Normal opacification of the portal venous Other: No abdominopelvic free fluid or free gas. No bowel containing hernias. Small amount of soft tissue infiltration in the low anterior abdomen. Musculoskeletal: Multilevel degenerative changes are present in the imaged portions of the spine. Few remote appearing compression deformities in the lumbar spine at L2, L4 and L5 as well as a limbus vertebrae at L3 could correlate for point tenderness however IMPRESSION: 1. Large left pleural effusion with near complete collapse of the left lower lobe and extensive volume loss in the lingula and portions of the left upper lobe. Underlying consolidation or lesion cannot be excluded. 2. No acute intra-abdominal process. 3. Cholelithiasis without evidence of acute cholecystitis. 4. Diverticulosis without evidence of acute diverticulitis. 5. Coronary atherosclerosis, post CABG. 6. Bilateral renal cortical scarring 7. Aortic Atherosclerosis (ICD10-I70.0). Electronically Signed   By: Lovena Le M.D.   On: 08/24/2018 00:19   US Thoracentesis Asp Pleural Space W/img Guide  Result Date: 08/24/2018 INDICATION: 63 year old with large left pleural effusion and shortness of breath. EXAM: ULTRASOUND GUIDED LEFT THORACENTESIS MEDICATIONS: None. COMPLICATIONS: None immediate. PROCEDURE: An ultrasound guided thoracentesis was thoroughly discussed with the patient and questions answered. The benefits, risks, alternatives and complications were also discussed. The patient understands and wishes to proceed with the procedure. Written consent was obtained. Ultrasound was performed to localize and mark an adequate pocket of fluid in the left chest. The area was then prepped and draped in the normal sterile fashion. 1% Lidocaine was used for local anesthesia. Under ultrasound guidance a 6 Fr  Safe-T-Centesis catheter was introduced. Thoracentesis was performed. The catheter was removed and a dressing applied. FINDINGS: A total of approximately 1.8 L of red fluid was removed. Samples were sent to the laboratory as requested by the clinical team. IMPRESSION: Successful ultrasound guided left thoracentesis yielding 1.8 L of pleural fluid. Electronically Signed   By: Markus Daft M.D.   On: 08/24/2018 12:29    Assessment and Plan:   CAD --Rules out for ACS. No CP and only SOB reported. Tn not consistent with ACS. EKG without acute changes from previous 09/2017 EKG. Does have h/o CABG with LIMA-LAD, SVG-OM2/3, SVG-Diag. 2010 stress test with fixed defect of inferior wall most consistent with scar v hibernating myocardium.  --Pending echocardiogram.  Per rounding cardiology MD, no sign of acute change on echocardiogram and patient stable for discharge from a cardiac standpoint, as no evidence for further ischemic work-up. Recommend follow-up as needed as an outpatient with primary cardiologist. --Formal read of echo pending. Further recommendations as needed per MD after formal read of echo.  HTN --Continue medical management.  HLD --Continue medical management --Last LDL 51 and at goal  Current tobacco use -Smoking cessation advised.  DM2, poorly controlled --Dietary changes recommended. --07/2018 A1C 8.9   For questions or updates, please contact [place] Please consult [email or web address] for contact info under     Signed, Arvil Chaco, PA-C  08/24/2018 12:55 PM

## 2018-08-24 NOTE — ED Notes (Signed)
Pt ambulatory to bathroom independently

## 2018-08-24 NOTE — ED Notes (Signed)
Report given to Lillia Mountain RN and pt moved to room 36 awaiting admission bed

## 2018-08-24 NOTE — ED Notes (Signed)
Dr Owens Shark at bedside to in pt & wife regarding plan of care; both voice good understanding; O2 applied at 2l/min via [place] per MD (sat 93% RA)

## 2018-08-24 NOTE — ED Notes (Signed)
Pt resting on stretcher with lights off to enhance rest. Pt eyes closed and even respirations. No distress noted at this time. Will continue to assess.

## 2018-08-27 LAB — PH, BODY FLUID: pH, Body Fluid: 7.3

## 2018-08-28 LAB — BODY FLUID CULTURE
Culture: NO GROWTH
Gram Stain: NONE SEEN

## 2018-08-29 ENCOUNTER — Other Ambulatory Visit: Payer: Self-pay | Admitting: Pulmonary Disease

## 2018-08-29 LAB — CYTOLOGY - NON PAP

## 2018-08-30 ENCOUNTER — Ambulatory Visit (INDEPENDENT_AMBULATORY_CARE_PROVIDER_SITE_OTHER)
Admission: RE | Admit: 2018-08-30 | Discharge: 2018-08-30 | Disposition: A | Discharge status: Home / Self Care | Payer: Medicare Other | Source: Ambulatory Visit | Attending: Family Medicine | Admitting: Family Medicine

## 2018-08-30 ENCOUNTER — Encounter: Payer: Self-pay | Admitting: Family Medicine

## 2018-08-30 ENCOUNTER — Ambulatory Visit (INDEPENDENT_AMBULATORY_CARE_PROVIDER_SITE_OTHER): Payer: Medicare Other | Admitting: Family Medicine

## 2018-08-30 ENCOUNTER — Other Ambulatory Visit: Payer: Self-pay

## 2018-08-30 VITALS — BP 144/78 | HR 92 | Temp 98.3°F | Ht 71.5 in | Wt 216.6 lb

## 2018-08-30 DIAGNOSIS — J90 Pleural effusion, not elsewhere classified: Secondary | ICD-10-CM

## 2018-08-30 DIAGNOSIS — J9811 Atelectasis: Secondary | ICD-10-CM | POA: Diagnosis not present

## 2018-08-30 LAB — CBC WITH DIFFERENTIAL/PLATELET
Basophils Absolute: 0.1 10*3/uL (ref 0.0–0.1)
Basophils Relative: 1.4 % (ref 0.0–3.0)
Eosinophils Absolute: 0.3 10*3/uL (ref 0.0–0.7)
Eosinophils Relative: 4.7 % (ref 0.0–5.0)
HCT: 44.3 % (ref 39.0–52.0)
Hemoglobin: 15 g/dL (ref 13.0–17.0)
Lymphocytes Relative: 20.3 % (ref 12.0–46.0)
Lymphs Abs: 1.4 10*3/uL (ref 0.7–4.0)
MCHC: 33.9 g/dL (ref 30.0–36.0)
MCV: 97.8 fl (ref 78.0–100.0)
Monocytes Absolute: 0.9 10*3/uL (ref 0.1–1.0)
Monocytes Relative: 12 % (ref 3.0–12.0)
Neutro Abs: 4.4 10*3/uL (ref 1.4–7.7)
Neutrophils Relative %: 61.6 % (ref 43.0–77.0)
Platelets: 247 10*3/uL (ref 150.0–400.0)
RBC: 4.54 Mil/uL (ref 4.22–5.81)
RDW: 14.1 % (ref 11.5–15.5)
WBC: 7.1 10*3/uL (ref 4.0–10.5)

## 2018-08-30 LAB — COMPREHENSIVE METABOLIC PANEL
ALT: 8 U/L (ref 0–53)
AST: 12 U/L (ref 0–37)
Albumin: 4.1 g/dL (ref 3.5–5.2)
Alkaline Phosphatase: 66 U/L (ref 39–117)
BUN: 20 mg/dL (ref 6–23)
CO2: 30 mEq/L (ref 19–32)
Calcium: 9.7 mg/dL (ref 8.4–10.5)
Chloride: 101 mEq/L (ref 96–112)
Creatinine, Ser: 0.87 mg/dL (ref 0.40–1.50)
GFR: 88.5 mL/min (ref >60.00)
Glucose, Bld: 194 mg/dL — ABNORMAL HIGH (ref 70–99)
Potassium: 4.2 mEq/L (ref 3.5–5.1)
Sodium: 141 mEq/L (ref 135–145)
Total Bilirubin: 0.6 mg/dL (ref 0.2–1.2)
Total Protein: 6.8 g/dL (ref 6.0–8.3)

## 2018-08-30 NOTE — Progress Notes (Signed)
Follow up s/p pleural effusion/tap.  He was seen in the emergency room after developing shortness of breath.  This was not present at previous office visit.  It had developed in the meantime.  He felt short of breath and went in for evaluation and was noted to have abnormal chest x-ray with pleural effusion.  Tap was done at the hospital.  He is awaiting follow-up results.  Here for follow-up today.  We talked about the differential diagnosis of his situation, up to and including cancer.  He is due for follow-up labs today.  He is shortness of breath is not totally resolved but he clearly feels better compared to previous.  Follow-up chest x-ray is pending.  He has pulmonary follow-up pending.  No fevers.  No discolored sputum.  He has some occasional clear sputum.  No vomiting.  His shortness of breath is improved.  PMH and SH reviewed  ROS: Per HPI unless specifically indicated in ROS section   Meds, vitals, and allergies reviewed.   GEN: nad, alert and oriented HEENT: ncat NECK: supple w/o LA CV: rrr.  PULM: ctab except for decreased breath sounds at the left lung base, no inc wob ABD: soft, +bs EXT: no edema SKIN: no acute rash

## 2018-09-02 ENCOUNTER — Encounter: Payer: Self-pay | Admitting: Family Medicine

## 2018-09-02 ENCOUNTER — Telehealth: Payer: Self-pay | Admitting: Family Medicine

## 2018-09-02 DIAGNOSIS — J90 Pleural effusion, not elsewhere classified: Secondary | ICD-10-CM | POA: Insufficient documentation

## 2018-09-02 NOTE — Telephone Encounter (Signed)
I called patient to check on him.  His shortness of breath is stable.  It is improved from when he presented to the hospital and not worse since discharge.  Overall he is still better than upon presentation.  I got his cytology report in the meantime.   DIAGNOSIS:  A. PLEURAL FLUID, ULTRASOUND-GUIDED THORACENTESIS:  - DIAGNOSTIC OF MALIGNANCY.  - NON-SMALL CELL CARCINOMA, FAVOR ADENOCARCINOMA.   We talked about this.  I wanted him to know about this before he went to his pulmonary appointment.  I told him I was sorry to hear about the diagnosis but I thought it was reasonable for him to hear from me instead of being surprised when he went for the pulmonary appointment.  He agreed and thanked me for the call.  I told him that he would likely have a team of people caring for him and that he would need extra lab testing, imaging, etc.  He said he understood that and he would await the pulmonary consult on Tuesday.  I appreciate the help of all involved.

## 2018-09-02 NOTE — Assessment & Plan Note (Signed)
I did not see results on all of his labs from the hospital, some appear to still be pending.  Follow-up labs and x-ray are pending.  See notes on imaging and labs. He has follow-up with pulmonary pending. We talked about the differential diagnosis, up to and including cancer.  He does feel better today but his shortness of breath is not totally resolved.  I would expect him to still have some effusion on the left side given his exam.  At this point he still appears okay for outpatient follow-up and I gave him routine emergency room cautions.  I appreciate the help of all involved. >25 minutes spent in face to face time with patient, >50% spent in counselling or coordination of care.

## 2018-09-04 ENCOUNTER — Other Ambulatory Visit: Payer: Self-pay | Admitting: Pulmonary Disease

## 2018-09-04 ENCOUNTER — Other Ambulatory Visit
Admission: RE | Admit: 2018-09-04 | Discharge: 2018-09-04 | Disposition: A | Discharge status: Home / Self Care | Payer: Medicare Other | Source: Ambulatory Visit | Attending: Pulmonary Disease | Admitting: Pulmonary Disease

## 2018-09-04 ENCOUNTER — Other Ambulatory Visit: Payer: Self-pay

## 2018-09-04 DIAGNOSIS — Z01812 Encounter for preprocedural laboratory examination: Secondary | ICD-10-CM | POA: Diagnosis not present

## 2018-09-04 DIAGNOSIS — Z20828 Contact with and (suspected) exposure to other viral communicable diseases: Secondary | ICD-10-CM | POA: Diagnosis not present

## 2018-09-04 DIAGNOSIS — J90 Pleural effusion, not elsewhere classified: Secondary | ICD-10-CM

## 2018-09-04 DIAGNOSIS — R0602 Shortness of breath: Secondary | ICD-10-CM | POA: Diagnosis not present

## 2018-09-04 DIAGNOSIS — J910 Malignant pleural effusion: Secondary | ICD-10-CM | POA: Diagnosis not present

## 2018-09-04 LAB — SARS CORONAVIRUS 2 (TAT 6-24 HRS): SARS Coronavirus 2: NEGATIVE

## 2018-09-05 ENCOUNTER — Ambulatory Visit
Admission: RE | Admit: 2018-09-05 | Discharge: 2018-09-05 | Disposition: A | Discharge status: Home / Self Care | Payer: Medicare Other | Source: Ambulatory Visit | Attending: Diagnostic Radiology | Admitting: Diagnostic Radiology

## 2018-09-05 ENCOUNTER — Ambulatory Visit
Admission: RE | Admit: 2018-09-05 | Discharge: 2018-09-05 | Disposition: A | Discharge status: Home / Self Care | Payer: Medicare Other | Source: Ambulatory Visit | Attending: Pulmonary Disease | Admitting: Pulmonary Disease

## 2018-09-05 DIAGNOSIS — J90 Pleural effusion, not elsewhere classified: Secondary | ICD-10-CM | POA: Diagnosis not present

## 2018-09-05 DIAGNOSIS — Z9889 Other specified postprocedural states: Secondary | ICD-10-CM | POA: Insufficient documentation

## 2018-09-05 NOTE — Procedures (Signed)
Left thoracentesis without difficulty  Complications:  None  Blood Loss: none  See dictation in canopy pacs  

## 2018-09-06 ENCOUNTER — Other Ambulatory Visit: Payer: Medicare Other

## 2018-09-06 ENCOUNTER — Encounter: Payer: Self-pay | Admitting: Oncology

## 2018-09-06 NOTE — Progress Notes (Signed)
Patient is coming in as a new patient, he is doing well. He was recently in the hospital and was tested for covid-19, results were negative and has had no symptoms. HE has been made aware of the no visitor policy and instructed that we can call his wife during the visit but she is not allowed to come to appointment.

## 2018-09-06 NOTE — Progress Notes (Signed)
Tumor Board Documentation  Craig Koch was presented by Norvel Richards, RN at our Tumor Board on 09/06/2018, which included representatives from medical oncology, radiation oncology, pathology, radiology, surgical, navigation, internal medicine, pharmacy, pulmonology, palliative care, research.  Craig Koch currently presents as a new patient, for [place], for new positive pathology with history of the following treatments: active survellience, surgical intervention(s).  Additionally, we reviewed previous medical and familial history, history of present illness, and recent lab results along with all available histopathologic and imaging studies. The tumor board considered available treatment options and made the following recommendations: Additional screening Placement of Pleurex catheter, See Medical Oncology 09/07/18  The following procedures/referrals were also placed: No orders of the defined types were placed in this encounter.   Clinical Trial Status: not discussed   Staging used: To be determined  AJCC Staging:       Group: Adenocarcinoma of Lung   National site-specific guidelines   were discussed with respect to the case.  Tumor board is a meeting of clinicians from various specialty areas who evaluate and discuss patients for whom a multidisciplinary approach is being considered. Final determinations in the plan of care are those of the provider(s). The responsibility for follow up of recommendations given during tumor board is that of the provider.   Today's extended care, comprehensive team conference, Craig Koch was not present for the discussion and was not examined.   Multidisciplinary Tumor Board is a multidisciplinary case peer review process.  Decisions discussed in the Multidisciplinary Tumor Board reflect the opinions of the specialists present at the conference without having examined the patient.  Ultimately, treatment and diagnostic decisions rest with the primary provider(s) and the  patient.

## 2018-09-07 ENCOUNTER — Ambulatory Visit
Admission: RE | Admit: 2018-09-07 | Discharge: 2018-09-07 | Disposition: A | Discharge status: Home / Self Care | Payer: Medicare Other | Source: Ambulatory Visit | Attending: Oncology | Admitting: Oncology

## 2018-09-07 ENCOUNTER — Other Ambulatory Visit: Payer: Self-pay

## 2018-09-07 ENCOUNTER — Inpatient Hospital Stay: Payer: Medicare Other | Attending: Oncology | Admitting: Oncology

## 2018-09-07 ENCOUNTER — Encounter: Payer: Self-pay | Admitting: Oncology

## 2018-09-07 VITALS — BP 113/70 | HR 74 | Temp 96.6°F | Resp 20 | Ht 73.62 in | Wt 208.7 lb

## 2018-09-07 DIAGNOSIS — Z7982 Long term (current) use of aspirin: Secondary | ICD-10-CM | POA: Insufficient documentation

## 2018-09-07 DIAGNOSIS — Z7984 Long term (current) use of oral hypoglycemic drugs: Secondary | ICD-10-CM | POA: Insufficient documentation

## 2018-09-07 DIAGNOSIS — J90 Pleural effusion, not elsewhere classified: Secondary | ICD-10-CM

## 2018-09-07 DIAGNOSIS — J910 Malignant pleural effusion: Secondary | ICD-10-CM | POA: Insufficient documentation

## 2018-09-07 DIAGNOSIS — E119 Type 2 diabetes mellitus without complications: Secondary | ICD-10-CM | POA: Insufficient documentation

## 2018-09-07 DIAGNOSIS — I10 Essential (primary) hypertension: Secondary | ICD-10-CM | POA: Insufficient documentation

## 2018-09-07 DIAGNOSIS — F17210 Nicotine dependence, cigarettes, uncomplicated: Secondary | ICD-10-CM | POA: Diagnosis not present

## 2018-09-07 DIAGNOSIS — R0602 Shortness of breath: Secondary | ICD-10-CM | POA: Insufficient documentation

## 2018-09-07 DIAGNOSIS — R5383 Other fatigue: Secondary | ICD-10-CM | POA: Diagnosis not present

## 2018-09-07 DIAGNOSIS — E785 Hyperlipidemia, unspecified: Secondary | ICD-10-CM | POA: Insufficient documentation

## 2018-09-07 DIAGNOSIS — Z794 Long term (current) use of insulin: Secondary | ICD-10-CM | POA: Diagnosis not present

## 2018-09-07 DIAGNOSIS — C3492 Malignant neoplasm of unspecified part of left bronchus or lung: Secondary | ICD-10-CM | POA: Diagnosis not present

## 2018-09-07 DIAGNOSIS — Z951 Presence of aortocoronary bypass graft: Secondary | ICD-10-CM

## 2018-09-07 DIAGNOSIS — Z720 Tobacco use: Secondary | ICD-10-CM

## 2018-09-07 DIAGNOSIS — Z8249 Family history of ischemic heart disease and other diseases of the circulatory system: Secondary | ICD-10-CM | POA: Insufficient documentation

## 2018-09-07 DIAGNOSIS — Z7189 Other specified counseling: Secondary | ICD-10-CM

## 2018-09-07 DIAGNOSIS — R634 Abnormal weight loss: Secondary | ICD-10-CM | POA: Insufficient documentation

## 2018-09-07 DIAGNOSIS — J9819 Other pulmonary collapse: Secondary | ICD-10-CM | POA: Insufficient documentation

## 2018-09-07 DIAGNOSIS — Z79899 Other long term (current) drug therapy: Secondary | ICD-10-CM | POA: Insufficient documentation

## 2018-09-07 DIAGNOSIS — C3490 Malignant neoplasm of unspecified part of unspecified bronchus or lung: Secondary | ICD-10-CM

## 2018-09-07 DIAGNOSIS — Z791 Long term (current) use of non-steroidal anti-inflammatories (NSAID): Secondary | ICD-10-CM | POA: Diagnosis not present

## 2018-09-07 NOTE — Progress Notes (Signed)
Patient here was discharged from hospital 2 weeks ago where he had work up and is here for evaluation of lung cancer.

## 2018-09-08 DIAGNOSIS — Z7189 Other specified counseling: Secondary | ICD-10-CM | POA: Insufficient documentation

## 2018-09-08 DIAGNOSIS — C3490 Malignant neoplasm of unspecified part of unspecified bronchus or lung: Secondary | ICD-10-CM | POA: Insufficient documentation

## 2018-09-08 NOTE — Progress Notes (Signed)
Hematology/Oncology Consult note [facility] [phone] [phone]   Patient Care Team: Tonia Ghent, MD as PCP - General (Family Medicine) Thelma Comp, [place] (Optometry) Telford Nab, RN as Registered Nurse  REFERRING PROVIDER: Ottie Glazier, MD  CHIEF COMPLAINTS/REASON FOR VISIT:  Evaluation of malignant pleural effusion  HISTORY OF PRESENTING ILLNESS:   Craig Koch is a  63 y.o.  male with PMH listed below was seen in consultation at the request of  Ottie Glazier, MD  for evaluation of malignant pleural effusion. Patient was recently admitted to [facility] due to large amount of pleural effusion, acute hypoxic respiratory failure. Patient reports feeling shortness of breath and dyspnea on exertion for several weeks and presented to emergency room.  A chest x-ray 08/23/2018 showed a large amount of left pleural effusion 08/24/2018 CT chest abdomen pelvis confirmed large left pleural effusion with near complete collapse of the left lower lobe and extensive volume loss in the lingula and a portion of the left upper lobe. No acute intra-abdominal process.  Cholelithiasis, diverticulitis, coronary atherosclerosis post CABG.  Bilateral renal cortical scarring.  Aortic atherosclerosis.  #08/24/2018 ultrasound guided diagnostic and therapeutic thoracentesis removed 1.8 L of pleural fluid. Patient subsequently felt better and was discharged to follow-up with pulmonology. 09/04/2018 patient followed up with Dr.Aleskerov chest x-ray showed recurrent moderate left pleural effusion.  Patient underwent ultrasound-guided left thoracentesis again and it drained 3 L of fluid. Pleural fluid cytology positive for adenocarcinoma. Patient was referred to me for further evaluation and management.  Today patient reports shortness of breath has improved since the second thoracentesis. Denies any chest pain, abdominal pain.reports fatigue.  Has lost weight.   Appetite is fair.   Review of Systems  Constitutional: Positive for appetite change, fatigue and unexpected weight change. Negative for chills and fever.  HENT:   Negative for hearing loss and voice change.   Eyes: Negative for eye problems and icterus.  Respiratory: Positive for shortness of breath. Negative for chest tightness and cough.   Cardiovascular: Negative for chest pain and leg swelling.  Gastrointestinal: Negative for abdominal distention and abdominal pain.  Endocrine: Negative for hot flashes.  Genitourinary: Negative for difficulty urinating, dysuria and frequency.   Musculoskeletal: Negative for arthralgias.  Skin: Negative for itching and rash.  Neurological: Negative for light-headedness and numbness.  Hematological: Negative for adenopathy. Does not bruise/bleed easily.  Psychiatric/Behavioral: Negative for confusion.    MEDICAL HISTORY:  Past Medical History:  Diagnosis Date   Anxiety    Arthritis    Coronary artery disease    Diabetes mellitus    Dyslipidemia    Hx of CABG    Hyperlipidemia    Hypertension    Pleural effusion     SURGICAL HISTORY: Past Surgical History:  Procedure Laterality Date   CATARACT EXTRACTION Left 09/2015   CORONARY ARTERY BYPASS GRAFT     (CABG with LIMA to the  LAD, SVG to OM2/OM3, SVG  to diag   Left ankle surgery     repair of fracture   Right lower leg surgery     rod    SOCIAL HISTORY: Social History   Socioeconomic History   Marital status: Married    Spouse name: Not on file   Number of children: Not on file   Years of education: Not on file   Highest education level: Not on file  Occupational History   Not on file  Social Needs   Financial resource strain: Not on file  Food insecurity    Worry: Not on file    Inability: Not on file   Transportation needs    Medical: Not on file    Non-medical: Not on file  Tobacco Use   Smoking status: Current Every Day Smoker    Packs/day:  0.50    Types: Cigarettes    Last attempt to quit: 07/06/2013    Years since quitting: 5.1   Smokeless tobacco: Former Systems developer    Types: Snuff  Substance and Sexual Activity   Alcohol use: Yes    Alcohol/week: 6.0 standard drinks    Types: 6 Cans of beer per week    Comment: occ, average 6 pack in a week   Drug use: No   Sexual activity: Never  Lifestyle   Physical activity    Days per week: Not on file    Minutes per session: Not on file   Stress: Not on file  Relationships   Social connections    Talks on phone: Not on file    Gets together: Not on file    Attends religious service: Not on file    Active member of club or organization: Not on file    Attends meetings of clubs or organizations: Not on file    Relationship status: Not on file   Intimate partner violence    Fear of current or ex partner: Not on file    Emotionally abused: Not on file    Physically abused: Not on file    Forced sexual activity: Not on file  Other Topics Concern   Not on file  Social History Narrative   On disability 2009 after prev injuries and CAD.     Married 1976   2 kids, 4 grandkids.     FAMILY HISTORY: Family History  Problem Relation Age of Onset   Heart disease Father    Colon cancer Neg Hx    Prostate cancer Neg Hx    Diabetes Neg Hx     ALLERGIES:  is allergic to lipitor [atorvastatin calcium].  MEDICATIONS:  Current Outpatient Medications  Medication Sig Dispense Refill   albuterol (VENTOLIN HFA) 108 (90 Base) MCG/ACT inhaler Inhale 2 puffs into the lungs every 6 (six) hours as needed for wheezing or shortness of breath. 18 g 0   aspirin 325 MG tablet Take 325 mg by mouth daily.     BD INSULIN SYRINGE U/F 31G X 5/16" 0.5 ML MISC USE DAILY AS DIRECTED. DX E11.9 100 each 1   doxycycline (VIBRA-TABS) 100 MG tablet Take 1 tablet (100 mg total) by mouth every 12 (twelve) hours. 14 tablet 0   DULoxetine (CYMBALTA) 30 MG capsule TAKE 1 CAPSULE BY MOUTH EVERY DAY  (Patient taking differently: Take 30 mg by mouth daily. ) 90 capsule 1   furosemide (LASIX) 20 MG tablet Take 1 tablet (20 mg total) by mouth daily. 30 tablet 0   glucose blood (ONE TOUCH ULTRA TEST) test strip USE TO TEST GLUCOSE LEVELS 4 (FOUR) TIMES DAILY. 400 each 3   HUMALOG 100 UNIT/ML injection INJECT 15 UNITS INTO THE SKIN 3 (THREE) TIMES DAILY WITH MEALS. (Patient taking differently: Inject 15 Units into the skin 3 (three) times daily with meals. ) 30 mL 3   ibuprofen (ADVIL,MOTRIN) 200 MG tablet Take 400 mg by mouth every 6 (six) hours as needed.      insulin detemir (LEVEMIR) 100 UNIT/ML injection Inject 0.3 mLs (30 Units total) into the skin at bedtime. 10 mL 5  lisinopril (ZESTRIL) 2.5 MG tablet TAKE 1 TABLET BY MOUTH DAILY 90 tablet 2   metFORMIN (GLUCOPHAGE) 500 MG tablet TAKE 1 TABLET (500 MG TOTAL) BY MOUTH 2 (TWO) TIMES DAILY WITH A MEAL. 180 tablet 1   metoprolol tartrate (LOPRESSOR) 100 MG tablet TAKE 0.5 TABLETS (50 MG TOTAL) BY MOUTH 2 (TWO) TIMES DAILY. 90 tablet 1   Omega-3 Fatty Acids (FISH OIL) 1200 MG CAPS Take 2 capsules by mouth 3 (three) times daily with meals.     rosuvastatin (CRESTOR) 10 MG tablet TAKE 1 TABLET (10 MG TOTAL) BY MOUTH DAILY. 90 tablet 3   Fexofenadine HCl (ALLEGRA ALLERGY PO) Take 1 tablet by mouth daily as needed.     No current facility-administered medications for this visit.      PHYSICAL EXAMINATION: ECOG PERFORMANCE STATUS: 1 - Symptomatic but completely ambulatory Vitals:   09/07/18 1316  BP: 113/70  Pulse: 74  Resp: 20  Temp: (!) 96.6 F (35.9 C)  SpO2: 97%   Filed Weights   09/07/18 1316  Weight: 208 lb 11.2 oz (94.7 kg)    Physical Exam Constitutional:      General: He is not in acute distress. HENT:     Head: Normocephalic and atraumatic.  Eyes:     General: No scleral icterus.    Pupils: Pupils are equal, round, and reactive to light.  Neck:     Musculoskeletal: Normal range of motion and neck supple.    Cardiovascular:     Rate and Rhythm: Normal rate and regular rhythm.     Heart sounds: Normal heart sounds.  Pulmonary:     Effort: Pulmonary effort is normal. No respiratory distress.     Breath sounds: No wheezing.     Comments: Severely diminished breath sounds left lower lobe. Abdominal:     General: Bowel sounds are normal. There is no distension.     Palpations: Abdomen is soft. There is no mass.     Tenderness: There is no abdominal tenderness.  Musculoskeletal: Normal range of motion.        General: No deformity.  Skin:    General: Skin is warm and dry.     Findings: No erythema or rash.  Neurological:     Mental Status: He is alert and oriented to person, place, and time.     Cranial Nerves: No cranial nerve deficit.     Coordination: Coordination normal.  Psychiatric:        Behavior: Behavior normal.        Thought Content: Thought content normal.     LABORATORY DATA:  I have reviewed the data as listed Lab Results  Component Value Date   WBC 7.1 08/30/2018   HGB 15.0 08/30/2018   HCT 44.3 08/30/2018   MCV 97.8 08/30/2018   PLT 247.0 08/30/2018   Recent Labs    07/31/18 0942 08/23/18 1848 08/24/18 0801 08/30/18 1402  NA 139 137  --  141  K 4.3 3.9  --  4.2  CL 101 103  --  101  CO2 29 26  --  30  GLUCOSE 157* 185*  --  194*  BUN 23 18  --  20  CREATININE 0.89 0.88  --  0.87  CALCIUM 9.9 9.1  --  9.7  GFRNONAA  --  >60  --   --   GFRAA  --  >60  --   --   PROT 6.7  --  6.8 6.8  ALBUMIN 4.2  --   --  4.1  AST 12  --   --  12  ALT 10  --   --  8  ALKPHOS 61  --   --  66  BILITOT 0.6  --   --  0.6   Iron/TIBC/Ferritin/ %Sat No results found for: IRON, TIBC, FERRITIN, IRONPCTSAT    RADIOGRAPHIC STUDIES: I have personally reviewed the radiological images as listed and agreed with the findings in the report.  Dg Chest 2 View  Result Date: 09/07/2018 CLINICAL DATA:  Decreased breath sounds in the left lower lobe. EXAM: CHEST - 2 VIEW  COMPARISON:  September 05, 2018 FINDINGS: There is a persistent but improving left lower lobe airspace opacity. There is a persistent moderate left-sided pleural effusion. There is no pneumothorax. There is generalized volume overload. The patient is status post prior median sternotomy. The heart size is stable. There is elevation of the left hemidiaphragm. IMPRESSION: 1. Moderate left-sided pleural effusion. 2. Persistent airspace opacity involving the left lower lobe favored to represent atelectasis with an infiltrate not excluded. 3. No pneumothorax. Electronically Signed   By: Constance Holster M.D.   On: 09/07/2018 15:01   Dg Chest 2 View  Result Date: 08/30/2018 CLINICAL DATA:  Follow-up pleural effusion. EXAM: CHEST - 2 VIEW COMPARISON:  Chest radiographs 08/24/2018, 08/23/2018 FINDINGS: Stable cardiomediastinal contours status post median sternotomy and CABG. The right lung is clear. There are bandlike opacities in the left mid lung likely representing atelectasis. Persistent moderate sized left pleural effusion. No pneumothorax. No acute osseous abnormality in the visualized skeleton. IMPRESSION: Persistent moderate size left pleural effusion and associated left lung atelectasis. Electronically Signed   By: Audie Pinto M.D.   On: 08/30/2018 20:37   Dg Chest 2 View  Result Date: 08/23/2018 CLINICAL DATA:  Shortness of breath 2 weeks with productive cough. EXAM: CHEST - 2 VIEW COMPARISON:  None. FINDINGS: There is moderate opacification over the lower half of the left lung likely large effusion with associated basilar atelectasis. Fluid tracks into the apex. Right lung is clear. Left heart border is obscured by the large left effusion. Visualized mediastinum is unremarkable. Degenerative change of the spine. IMPRESSION: Large left pleural effusion likely with associated basilar atelectasis. Electronically Signed   By: Marin Olp M.D.   On: 08/23/2018 19:13   Ct Chest W Contrast  Result  Date: 08/24/2018 CLINICAL DATA:  Chest pain, shortness of breath, fusion suspected. Smoker, approximately 25 pack years EXAM: CT CHEST AND ABDOMEN WITH CONTRAST TECHNIQUE: Multidetector CT imaging of the chest and abdomen was performed following the standard protocol during bolus administration of intravenous contrast. CONTRAST:  156mL OMNIPAQUE IOHEXOL 300 MG/ML  SOLN COMPARISON:  Same-day radiograph FINDINGS: CT CHEST FINDINGS Cardiovascular: Postsurgical changes related to prior CABG. Extensive calcification of the native coronary arteries. Heart is normal size. No pericardial effusion. Atherosclerotic plaque within the normal caliber aorta. Pulmonary arteries are normal caliber. Mediastinum/Nodes: Small amount stranding in the mediastinum likely related to prior CABG. Subcentimeter hypoattenuating nodule in the left thyroid lobe (2/3). Trachea is patent. Esophagus is unremarkable. Lungs/Pleura: There is a large left pleural effusion with near complete collapse of the left lower lobe and extensive volume loss in the lingula and portions of the left upper lobe as well. There is abrupt tapering of the tracheobronchial tree is inter is into the airless portions of lung. Some ground-glass attenuation is noted adjacent to the fusion likely reflecting passive atelectatic change. In the right lung is clear. No pneumothorax. No visible nodules or  suspicious masses though these are not fully excluded in the regions of airless lung. Musculoskeletal: Postsurgical changes from prior sternotomy with intact and aligned sutures. Multilevel degenerative changes are present in the imaged portions of the spine. Additional degenerative changes in the shoulders. No acute or suspicious osseous lesion. No chest wall abnormality. CT ABDOMEN FINDINGS Hepatobiliary: No focal liver lesion. Few calcified gallstones layer dependently within the gallbladder. No biliary ductal dilatation. Pancreas: Unremarkable. No pancreatic ductal  dilatation or surrounding inflammatory changes. Spleen: Normal in size without focal abnormality. Adrenals/Urinary Tract: Normal adrenal glands. Mild bilateral nonspecific perinephric stranding, a nonspecific finding though may correlate with either age or decreased renal function. Lobular appearance of the kidneys may reflect cortical scarring. Kidneys are otherwise unremarkable, without renal calculi, suspicious lesion, or hydronephrosis. Bladder is unremarkable. Stomach/Bowel: Distal esophagus, stomach and duodenal sweep are unremarkable. No bowel wall thickening or dilatation. No evidence of obstruction. A normal appendix is visualized. Scattered colonic diverticula without focal pericolonic inflammation to suggest diverticulitis. Vascular/Lymphatic: Extensive atheromatous plaque throughout the aorta and branch vessels. [place] of the renal arteries are heavily diseased of the branches opacify normally. Normal opacification of the portal venous Other: No abdominopelvic free fluid or free gas. No bowel containing hernias. Small amount of soft tissue infiltration in the low anterior abdomen. Musculoskeletal: Multilevel degenerative changes are present in the imaged portions of the spine. Few remote appearing compression deformities in the lumbar spine at L2, L4 and L5 as well as a limbus vertebrae at L3 could correlate for point tenderness however IMPRESSION: 1. Large left pleural effusion with near complete collapse of the left lower lobe and extensive volume loss in the lingula and portions of the left upper lobe. Underlying consolidation or lesion cannot be excluded. 2. No acute intra-abdominal process. 3. Cholelithiasis without evidence of acute cholecystitis. 4. Diverticulosis without evidence of acute diverticulitis. 5. Coronary atherosclerosis, post CABG. 6. Bilateral renal cortical scarring 7. Aortic Atherosclerosis (ICD10-I70.0). Electronically Signed   By: Lovena Le M.D.   On: 08/24/2018 00:19   Ct  Abdomen Pelvis W Contrast  Result Date: 08/24/2018 CLINICAL DATA:  Chest pain, shortness of breath, fusion suspected. Smoker, approximately 25 pack years EXAM: CT CHEST AND ABDOMEN WITH CONTRAST TECHNIQUE: Multidetector CT imaging of the chest and abdomen was performed following the standard protocol during bolus administration of intravenous contrast. CONTRAST:  155mL OMNIPAQUE IOHEXOL 300 MG/ML  SOLN COMPARISON:  Same-day radiograph FINDINGS: CT CHEST FINDINGS Cardiovascular: Postsurgical changes related to prior CABG. Extensive calcification of the native coronary arteries. Heart is normal size. No pericardial effusion. Atherosclerotic plaque within the normal caliber aorta. Pulmonary arteries are normal caliber. Mediastinum/Nodes: Small amount stranding in the mediastinum likely related to prior CABG. Subcentimeter hypoattenuating nodule in the left thyroid lobe (2/3). Trachea is patent. Esophagus is unremarkable. Lungs/Pleura: There is a large left pleural effusion with near complete collapse of the left lower lobe and extensive volume loss in the lingula and portions of the left upper lobe as well. There is abrupt tapering of the tracheobronchial tree is inter is into the airless portions of lung. Some ground-glass attenuation is noted adjacent to the fusion likely reflecting passive atelectatic change. In the right lung is clear. No pneumothorax. No visible nodules or suspicious masses though these are not fully excluded in the regions of airless lung. Musculoskeletal: Postsurgical changes from prior sternotomy with intact and aligned sutures. Multilevel degenerative changes are present in the imaged portions of the spine. Additional degenerative changes in the shoulders. No acute  or suspicious osseous lesion. No chest wall abnormality. CT ABDOMEN FINDINGS Hepatobiliary: No focal liver lesion. Few calcified gallstones layer dependently within the gallbladder. No biliary ductal dilatation. Pancreas:  Unremarkable. No pancreatic ductal dilatation or surrounding inflammatory changes. Spleen: Normal in size without focal abnormality. Adrenals/Urinary Tract: Normal adrenal glands. Mild bilateral nonspecific perinephric stranding, a nonspecific finding though may correlate with either age or decreased renal function. Lobular appearance of the kidneys may reflect cortical scarring. Kidneys are otherwise unremarkable, without renal calculi, suspicious lesion, or hydronephrosis. Bladder is unremarkable. Stomach/Bowel: Distal esophagus, stomach and duodenal sweep are unremarkable. No bowel wall thickening or dilatation. No evidence of obstruction. A normal appendix is visualized. Scattered colonic diverticula without focal pericolonic inflammation to suggest diverticulitis. Vascular/Lymphatic: Extensive atheromatous plaque throughout the aorta and branch vessels. [place] of the renal arteries are heavily diseased of the branches opacify normally. Normal opacification of the portal venous Other: No abdominopelvic free fluid or free gas. No bowel containing hernias. Small amount of soft tissue infiltration in the low anterior abdomen. Musculoskeletal: Multilevel degenerative changes are present in the imaged portions of the spine. Few remote appearing compression deformities in the lumbar spine at L2, L4 and L5 as well as a limbus vertebrae at L3 could correlate for point tenderness however IMPRESSION: 1. Large left pleural effusion with near complete collapse of the left lower lobe and extensive volume loss in the lingula and portions of the left upper lobe. Underlying consolidation or lesion cannot be excluded. 2. No acute intra-abdominal process. 3. Cholelithiasis without evidence of acute cholecystitis. 4. Diverticulosis without evidence of acute diverticulitis. 5. Coronary atherosclerosis, post CABG. 6. Bilateral renal cortical scarring 7. Aortic Atherosclerosis (ICD10-I70.0). Electronically Signed   By: Lovena Le  M.D.   On: 08/24/2018 00:19   Dg Chest Port 1 View  Result Date: 09/05/2018 CLINICAL DATA:  Status post left thoracentesis EXAM: PORTABLE CHEST 1 VIEW COMPARISON:  08/30/2018 FINDINGS: Cardiac shadow is stable. Postsurgical changes are again noted. Persistent scarring is noted in the left lung base with minimal residual effusion following thoracentesis. No pneumothorax is noted. No bony abnormality is seen. IMPRESSION: No pneumothorax following left-sided thoracentesis. Electronically Signed   By: Inez Catalina M.D.   On: 09/05/2018 12:20   Dg Chest Port 1 View  Result Date: 08/24/2018 CLINICAL DATA:  Status post left thoracentesis EXAM: PORTABLE CHEST 1 VIEW COMPARISON:  08/23/2018 FINDINGS: Cardiac shadow is stable. Postsurgical changes are again noted. The right lung is clear. Left lung demonstrates areas of collapse lung as well as mild residual effusion. No pneumothorax is identified IMPRESSION: No evidence of pneumothorax following thoracentesis. Decreased left pleural effusion is noted. Electronically Signed   By: Inez Catalina M.D.   On: 08/24/2018 16:03   US Thoracentesis Asp Pleural Space W/img Guide  Result Date: 09/05/2018 INDICATION: Left-sided recurrent pleural effusion EXAM: ULTRASOUND GUIDED THORACENTESIS MEDICATIONS: None. COMPLICATIONS: None immediate. PROCEDURE: An ultrasound guided thoracentesis was thoroughly discussed with the patient and questions answered. The benefits, risks, alternatives and complications were also discussed. The patient understands and wishes to proceed with the procedure. Written consent was obtained. Ultrasound was performed to localize and mark an adequate pocket of fluid in the left chest. The area was then prepped and draped in the normal sterile fashion. 1% Lidocaine was used for local anesthesia. Under ultrasound guidance, a 6 Fr Safe-T-Centesis catheter was introduced. Thoracentesis was performed. The catheter was removed and a dressing applied. FINDINGS: A  total of approximately 3 L of sanguinous fluid was  removed. Samples were sent to the laboratory as requested by the clinical team. IMPRESSION: Successful ultrasound guided left thoracentesis yielding 3 L of pleural fluid. Electronically Signed   By: Inez Catalina M.D.   On: 09/05/2018 12:21   US Thoracentesis Asp Pleural Space W/img Guide  Result Date: 08/24/2018 INDICATION: 63 year old with large left pleural effusion and shortness of breath. EXAM: ULTRASOUND GUIDED LEFT THORACENTESIS MEDICATIONS: None. COMPLICATIONS: None immediate. PROCEDURE: An ultrasound guided thoracentesis was thoroughly discussed with the patient and questions answered. The benefits, risks, alternatives and complications were also discussed. The patient understands and wishes to proceed with the procedure. Written consent was obtained. Ultrasound was performed to localize and mark an adequate pocket of fluid in the left chest. The area was then prepped and draped in the normal sterile fashion. 1% Lidocaine was used for local anesthesia. Under ultrasound guidance a 6 Fr Safe-T-Centesis catheter was introduced. Thoracentesis was performed. The catheter was removed and a dressing applied. FINDINGS: A total of approximately 1.8 L of red fluid was removed. Samples were sent to the laboratory as requested by the clinical team. IMPRESSION: Successful ultrasound guided left thoracentesis yielding 1.8 L of pleural fluid. Electronically Signed   By: Markus Daft M.D.   On: 08/24/2018 12:29      ASSESSMENT & PLAN:  1. Recurrent pleural effusion on left   2. lung adenocarcinoma   3. Goals of care, counseling/discussion    X-ray, CT images were independently reviewed by me and discussed with patient.  Pathology results were reviewed with patient in the clinic, also called patient's wife and updated her.  Malignant pleural effusion, cytology positive for non-small cell carcinoma, favor adenocarcinoma.  Sufficient tumor is present in block A1  for ancillary studies.  The diagnosis and care plan were discussed with patient in detail. 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 Recommend patient to obtain MRI brain and PET scan to complete staging. I discussed with both patient and wife that patient's condition is not curable.  Recommend palliative chemotherapy. The goal of treatment which is to palliate disease, disease related symptoms, improve quality of life and hopefully prolong life was highlighted in our discussion.  Chemotherapy education was provided.  We had discussed the composition of chemotherapy regimen, length of chemo cycle, duration of treatment and the time to assess response to treatment.    I explained to the patient the risks and benefits of chemotherapy including all but not limited to hair loss, mouth sore, nausea, vomiting, diarrhea, low blood counts, bleeding, neuropathy and risk of life threatening infection and even death, secondary malignancy etc.  . Patient voices understanding and willing to proceed chemotherapy.   #I recommend chemotherapy education; Medi- port placement.  Patient wants to hold off scheduling chemotherapy education and Mediport placement for now.  He wants to discuss with his family and update me.  #Malignant pleural effusion, recurrent.  Obtain chest x-ray today, chest x-ray images were independently reviewed.  Recommend moderate left pleural effusion. He is not significant synchronizing today.  I recommend patient to consider Pleurx catheter insertion for palliation. Patient declines and he prefers to proceed with another thoracentesis, if fluid recurs again, he will consider Pleurx catheter insertion.  #Need to send NGS Omniseq testing.  # Smoke cessation discussed with patient. Supportive care measures are necessary for patient well-being and will be provided as necessary. We spent sufficient time to discuss many aspect of care, questions were answered to patient's satisfaction.  Orders  Placed This Encounter  Procedures  DG Chest 2 View    Standing Status:   Future    Number of Occurrences:   1    Standing Expiration Date:   09/07/2019    Order Specific Question:   Reason for Exam (SYMPTOM  OR DIAGNOSIS REQUIRED)    Answer:   decreased breath sound left lower lobe. SOB, pleural effusion    Order Specific Question:   Preferred imaging location?    Answer:   [facility]    Order Specific Question:   Radiology Contrast Protocol - do NOT remove file path    Answer:   \[email or web address]   MR Brain W Wo Contrast    Standing Status:   Future    Standing Expiration Date:   09/07/2019    Order Specific Question:   If indicated for the ordered procedure, I authorize the administration of contrast media per Radiology protocol    Answer:   Yes    Order Specific Question:   What is the patient's sedation requirement?    Answer:   No Sedation    Order Specific Question:   Does the patient have a pacemaker or implanted devices?    Answer:   No    Order Specific Question:   Use SRS Protocol?    Answer:   No    Order Specific Question:   Radiology Contrast Protocol - do NOT remove file path    Answer:   \[email or web address]    Order Specific Question:   Preferred imaging location?    Answer:   [facility] (table limit-400lbs)   NM PET Image Initial (PI) Skull Base To Thigh    Standing Status:   Future    Standing Expiration Date:   09/07/2019    Order Specific Question:   If indicated for the ordered procedure, I authorize the administration of a radiopharmaceutical per Radiology protocol    Answer:   Yes    Order Specific Question:   Preferred imaging location?    Answer:   [facility]    Order Specific Question:   Radiology Contrast Protocol - do NOT remove file path    Answer:   \[email or web address]\Radiant\[email or web address]   US THORACENTESIS ASP PLEURAL SPACE W/IMG GUIDE    Standing Status:    Standing    Number of Occurrences:   10    Standing Expiration Date:   03/07/2019    Order Specific Question:   Are labs required for specimen collection?    Answer:   No    Order Specific Question:   Reason for Exam (SYMPTOM  OR DIAGNOSIS REQUIRED)    Answer:   pleural effusion    Order Specific Question:   Preferred imaging location?    Answer:   [facility]    All questions were answered. The patient knows to call the clinic with any problems questions or concerns.  cc Ottie Glazier, MD  Tonia Ghent, MD   Return of visit: to be determined,  Thank you for this kind referral and the opportunity to participate in the care of this patient. A copy of today's note is routed to referring provider  Total face to face encounter time for this patient visit was 60 min. >50% of the time was  spent in counseling and coordination of care.    Earlie Server, MD, PhD Hematology Oncology [facility] Pager- [phone] 09/08/2018

## 2018-09-11 ENCOUNTER — Other Ambulatory Visit: Payer: Self-pay | Admitting: *Deleted

## 2018-09-11 ENCOUNTER — Encounter: Payer: Self-pay | Admitting: *Deleted

## 2018-09-11 NOTE — Progress Notes (Signed)
  Oncology Nurse Navigator Documentation  Navigator Location: [facility] (09/11/18 1400) Referral Date to RadOnc/MedOnc: 09/04/18 (09/11/18 1400) )Navigator Encounter Type: Introductory Phone Call (09/11/18 1400)                       Treatment Phase: Pre-Tx/Tx Discussion (09/11/18 1400) Barriers/Navigation Needs: Coordination of Care;Education (09/11/18 1400) Education: Understanding Cancer/ Treatment Options (09/11/18 1400) Interventions: Coordination of Care (09/11/18 1400)   Coordination of Care: Appts (09/11/18 1400)        Acuity: Level 2 (09/11/18 1400)   Acuity Level 2: Initial guidance, education and coordination as needed;Educational needs;Assistance expediting appointments (09/11/18 1400)  phone call made to patient to introduce to navigator services. All questions answered during visit. Reviewed cancer diagnosis and treatment options per Dr. Collie Siad note. Pt stated that he wants to wait for his results from his PET scan and brain MRI which are scheduled next week. Informed pt that I would update Dr. Tasia Catchings. Contact info given and instructed pt to call with any further questions or needs. Pt verbalized understanding. Nothing further needed at this time.   Time Spent with Patient: 30 (09/11/18 1400)

## 2018-09-12 ENCOUNTER — Other Ambulatory Visit: Payer: Self-pay

## 2018-09-12 ENCOUNTER — Other Ambulatory Visit
Admission: RE | Admit: 2018-09-12 | Discharge: 2018-09-12 | Disposition: A | Discharge status: Home / Self Care | Payer: Medicare Other | Source: Ambulatory Visit | Attending: Oncology | Admitting: Oncology

## 2018-09-12 ENCOUNTER — Telehealth: Payer: Self-pay

## 2018-09-12 DIAGNOSIS — Z01812 Encounter for preprocedural laboratory examination: Secondary | ICD-10-CM | POA: Insufficient documentation

## 2018-09-12 DIAGNOSIS — Z20828 Contact with and (suspected) exposure to other viral communicable diseases: Secondary | ICD-10-CM | POA: Insufficient documentation

## 2018-09-12 LAB — SARS CORONAVIRUS 2 (TAT 6-24 HRS): SARS Coronavirus 2: NEGATIVE

## 2018-09-12 NOTE — Telephone Encounter (Signed)
Patient was informed about his thoracentesis for tomorrow at 7:30 AM at the [facility]. Patient agreed and understood.

## 2018-09-13 ENCOUNTER — Other Ambulatory Visit: Payer: Self-pay

## 2018-09-13 ENCOUNTER — Other Ambulatory Visit: Payer: Self-pay | Admitting: Student-PharmD

## 2018-09-13 ENCOUNTER — Ambulatory Visit
Admission: RE | Admit: 2018-09-13 | Discharge: 2018-09-13 | Disposition: A | Discharge status: Home / Self Care | Payer: Medicare Other | Source: Ambulatory Visit | Attending: Oncology | Admitting: Oncology

## 2018-09-13 ENCOUNTER — Other Ambulatory Visit: Payer: Self-pay | Admitting: Interventional Radiology

## 2018-09-13 ENCOUNTER — Ambulatory Visit
Admission: RE | Admit: 2018-09-13 | Discharge: 2018-09-13 | Disposition: A | Discharge status: Home / Self Care | Payer: Medicare Other | Source: Ambulatory Visit | Attending: Interventional Radiology | Admitting: Interventional Radiology

## 2018-09-13 DIAGNOSIS — C3490 Malignant neoplasm of unspecified part of unspecified bronchus or lung: Secondary | ICD-10-CM | POA: Insufficient documentation

## 2018-09-13 DIAGNOSIS — R918 Other nonspecific abnormal finding of lung field: Secondary | ICD-10-CM | POA: Diagnosis not present

## 2018-09-13 DIAGNOSIS — J90 Pleural effusion, not elsewhere classified: Secondary | ICD-10-CM

## 2018-09-13 DIAGNOSIS — Z85118 Personal history of other malignant neoplasm of bronchus and lung: Secondary | ICD-10-CM | POA: Insufficient documentation

## 2018-09-13 DIAGNOSIS — Z9889 Other specified postprocedural states: Secondary | ICD-10-CM | POA: Insufficient documentation

## 2018-09-13 NOTE — Procedures (Signed)
Pre procedural Dx: Symptomatic Pleural effusion Post procedural Dx: Same  Successful US guided left sided thoracentesis yielding 3 L of slightly blood tinged pleural fluid.    EBL: None Complications: None immediate.  Ronny Bacon, MD Pager #: [phone]

## 2018-09-17 ENCOUNTER — Ambulatory Visit
Admission: RE | Admit: 2018-09-17 | Discharge: 2018-09-17 | Disposition: A | Discharge status: Home / Self Care | Payer: Medicare Other | Source: Ambulatory Visit | Attending: Oncology | Admitting: Oncology

## 2018-09-17 ENCOUNTER — Other Ambulatory Visit: Payer: Self-pay

## 2018-09-17 DIAGNOSIS — C3490 Malignant neoplasm of unspecified part of unspecified bronchus or lung: Secondary | ICD-10-CM | POA: Insufficient documentation

## 2018-09-17 LAB — GLUCOSE, CAPILLARY
Glucose-Capillary: 270 mg/dL — ABNORMAL HIGH (ref 70–99)
Glucose-Capillary: 282 mg/dL — ABNORMAL HIGH (ref 70–99)

## 2018-09-18 ENCOUNTER — Telehealth: Payer: Self-pay | Admitting: *Deleted

## 2018-09-18 ENCOUNTER — Other Ambulatory Visit: Payer: Self-pay

## 2018-09-18 ENCOUNTER — Encounter
Admission: RE | Admit: 2018-09-18 | Discharge: 2018-09-18 | Disposition: A | Discharge status: Home / Self Care | Payer: Medicare Other | Source: Ambulatory Visit | Attending: Oncology | Admitting: Oncology

## 2018-09-18 DIAGNOSIS — C3490 Malignant neoplasm of unspecified part of unspecified bronchus or lung: Secondary | ICD-10-CM | POA: Insufficient documentation

## 2018-09-18 DIAGNOSIS — E119 Type 2 diabetes mellitus without complications: Secondary | ICD-10-CM | POA: Insufficient documentation

## 2018-09-18 DIAGNOSIS — I2510 Atherosclerotic heart disease of native coronary artery without angina pectoris: Secondary | ICD-10-CM | POA: Insufficient documentation

## 2018-09-18 DIAGNOSIS — J948 Other specified pleural conditions: Secondary | ICD-10-CM | POA: Insufficient documentation

## 2018-09-18 DIAGNOSIS — I10 Essential (primary) hypertension: Secondary | ICD-10-CM | POA: Diagnosis not present

## 2018-09-18 DIAGNOSIS — E785 Hyperlipidemia, unspecified: Secondary | ICD-10-CM | POA: Insufficient documentation

## 2018-09-18 DIAGNOSIS — K8020 Calculus of gallbladder without cholecystitis without obstruction: Secondary | ICD-10-CM | POA: Insufficient documentation

## 2018-09-18 DIAGNOSIS — F17210 Nicotine dependence, cigarettes, uncomplicated: Secondary | ICD-10-CM | POA: Insufficient documentation

## 2018-09-18 DIAGNOSIS — Z951 Presence of aortocoronary bypass graft: Secondary | ICD-10-CM | POA: Diagnosis not present

## 2018-09-18 DIAGNOSIS — R911 Solitary pulmonary nodule: Secondary | ICD-10-CM | POA: Diagnosis not present

## 2018-09-18 LAB — GLUCOSE, CAPILLARY: Glucose-Capillary: 129 mg/dL — ABNORMAL HIGH (ref 70–99)

## 2018-09-18 MED ORDER — FLUDEOXYGLUCOSE F - 18 (FDG) INJECTION
11.5200 | Freq: Once | INTRAVENOUS | Status: AC | PRN
  Administered 2018-09-18: 11.52 via INTRAVENOUS

## 2018-09-18 NOTE — Telephone Encounter (Signed)
Called report  IMPRESSION: 1. Left-sided pleural hypermetabolism, consistent with malignant pleural effusion. 2. Left upper and less so left lower lobe low-level hypermetabolism corresponding to areas of airspace and ground-glass opacity. This is primarily felt to represent atelectasis. Underlying left upper lobe primary bronchogenic carcinoma cannot be excluded. This is suboptimally evaluated secondary to the extent of volume loss from hydropneumothorax. 3. A medial left upper lobe area of more focal hypermetabolism could also represent pleural disease or a site of a small left upper lobe primary. 4. No evidence of extrathoracic hypermetabolic metastasis. 5. Left-sided hydropneumothorax with the pleural air component being new since the prior CT. 6. Cholelithiasis.  These results will be called to the ordering clinician or representative by the Radiologist Assistant, and communication documented in the PACS or zVision Dashboard.   Electronically Signed   By: Abigail Miyamoto M.D.   On: 09/18/2018 15:26

## 2018-09-20 ENCOUNTER — Ambulatory Visit (INDEPENDENT_AMBULATORY_CARE_PROVIDER_SITE_OTHER): Payer: Medicare Other | Admitting: Family Medicine

## 2018-09-20 ENCOUNTER — Encounter: Payer: Self-pay | Admitting: Family Medicine

## 2018-09-20 ENCOUNTER — Other Ambulatory Visit: Payer: Self-pay

## 2018-09-20 ENCOUNTER — Ambulatory Visit
Admission: RE | Admit: 2018-09-20 | Discharge: 2018-09-20 | Disposition: A | Discharge status: Home / Self Care | Payer: Medicare Other | Source: Ambulatory Visit | Attending: Oncology | Admitting: Oncology

## 2018-09-20 DIAGNOSIS — Z029 Encounter for administrative examinations, unspecified: Secondary | ICD-10-CM

## 2018-09-20 DIAGNOSIS — C3490 Malignant neoplasm of unspecified part of unspecified bronchus or lung: Secondary | ICD-10-CM | POA: Insufficient documentation

## 2018-09-20 MED ORDER — DULOXETINE HCL 30 MG PO CPEP: 30.0000 mg | ORAL_CAPSULE | Freq: Every day | ORAL | Status: DC

## 2018-09-20 MED ORDER — INSULIN LISPRO 100 UNIT/ML ~~LOC~~ SOLN: 15.0000 [IU] | Freq: Three times a day (TID) | SUBCUTANEOUS | Status: DC

## 2018-09-20 MED ORDER — GADOBUTROL 1 MMOL/ML IV SOLN
9.0000 mL | Freq: Once | INTRAVENOUS | Status: AC | PRN
  Administered 2018-09-20: 17:00:00 9 mL via INTRAVENOUS

## 2018-09-20 NOTE — Progress Notes (Signed)
He had PET scan done, with MRI pending, with onc f/u pending.  Known lung CA.  He has SOB with walking but not at rest.  He had prev thoracentesis done.    DMV forms done.  See scanned forms.    History of coronary disease.  Bypass in 2004.  No pacemaker.  No arrhythmias.  No defibrillator.  History diabetes.  A1c 8.9.  No hypoglycemia that would prevent driving.  History of anxiety.  Controlled.  No adverse effect on medication.  History of DUI in the distant past.  Very rare alcohol now.  No issues with driving now.  He does not drive if he has had any alcohol.  See scanned forms.  Lung cancer.  Normal oxygen saturation.  Has follow-up pending.  Still able to drive, with his current functional capacity.  PMH and SH reviewed  ROS: Per HPI unless specifically indicated in ROS section   Meds, vitals, and allergies reviewed.   GEN: nad, alert and oriented HEENT:ncat NECK: supple w/o LA CV: rrr.  PULM: Decreased breath sounds in the left lower lobe but this is improved compared to previous.  No increased work of breathing.  Clear to auscultation bilaterally otherwise. ABD: soft, +bs EXT: no edema SKIN: no acute rash

## 2018-09-20 NOTE — Patient Instructions (Signed)
Please send in the original DMV papers.  I'll await the notes from your other docs.  Take care.  Glad to see you.

## 2018-09-21 ENCOUNTER — Encounter: Payer: Self-pay | Admitting: Oncology

## 2018-09-21 ENCOUNTER — Other Ambulatory Visit: Payer: Self-pay

## 2018-09-21 ENCOUNTER — Inpatient Hospital Stay: Payer: Medicare Other | Admitting: Oncology

## 2018-09-21 VITALS — BP 115/64 | HR 78 | Temp 96.7°F | Resp 18 | Wt 212.9 lb

## 2018-09-21 DIAGNOSIS — J9819 Other pulmonary collapse: Secondary | ICD-10-CM | POA: Diagnosis not present

## 2018-09-21 DIAGNOSIS — Z951 Presence of aortocoronary bypass graft: Secondary | ICD-10-CM | POA: Diagnosis not present

## 2018-09-21 DIAGNOSIS — R0602 Shortness of breath: Secondary | ICD-10-CM | POA: Diagnosis not present

## 2018-09-21 DIAGNOSIS — R5383 Other fatigue: Secondary | ICD-10-CM | POA: Diagnosis not present

## 2018-09-21 DIAGNOSIS — Z79899 Other long term (current) drug therapy: Secondary | ICD-10-CM | POA: Diagnosis not present

## 2018-09-21 DIAGNOSIS — C3490 Malignant neoplasm of unspecified part of unspecified bronchus or lung: Secondary | ICD-10-CM

## 2018-09-21 DIAGNOSIS — Z7982 Long term (current) use of aspirin: Secondary | ICD-10-CM | POA: Diagnosis not present

## 2018-09-21 DIAGNOSIS — Z720 Tobacco use: Secondary | ICD-10-CM | POA: Diagnosis not present

## 2018-09-21 DIAGNOSIS — Z7984 Long term (current) use of oral hypoglycemic drugs: Secondary | ICD-10-CM | POA: Diagnosis not present

## 2018-09-21 DIAGNOSIS — Z7189 Other specified counseling: Secondary | ICD-10-CM | POA: Diagnosis not present

## 2018-09-21 DIAGNOSIS — J90 Pleural effusion, not elsewhere classified: Secondary | ICD-10-CM

## 2018-09-21 DIAGNOSIS — Z794 Long term (current) use of insulin: Secondary | ICD-10-CM | POA: Diagnosis not present

## 2018-09-21 DIAGNOSIS — E119 Type 2 diabetes mellitus without complications: Secondary | ICD-10-CM | POA: Diagnosis not present

## 2018-09-21 DIAGNOSIS — I10 Essential (primary) hypertension: Secondary | ICD-10-CM | POA: Diagnosis not present

## 2018-09-21 DIAGNOSIS — E785 Hyperlipidemia, unspecified: Secondary | ICD-10-CM | POA: Diagnosis not present

## 2018-09-21 DIAGNOSIS — J910 Malignant pleural effusion: Secondary | ICD-10-CM | POA: Diagnosis not present

## 2018-09-21 DIAGNOSIS — Z791 Long term (current) use of non-steroidal anti-inflammatories (NSAID): Secondary | ICD-10-CM | POA: Diagnosis not present

## 2018-09-21 DIAGNOSIS — C3492 Malignant neoplasm of unspecified part of left bronchus or lung: Secondary | ICD-10-CM | POA: Diagnosis not present

## 2018-09-21 DIAGNOSIS — Z8249 Family history of ischemic heart disease and other diseases of the circulatory system: Secondary | ICD-10-CM | POA: Diagnosis not present

## 2018-09-21 NOTE — Research (Signed)
09/21/2018  Patient in to clinic for consult with Dr. Tasia Catchings, [facility] Lacy study presented to the patient. Research RN reviewed the ICF / Hipaa for [employer] version dated 06-25-19  with the patient in it's entirety with specific explanation provided regarding alternatives, risks, potential benefits of participation. Patient verbalizes understanding of all the information provided. Patient was informed his participation is strictly voluntary and he can withdraw his consent at any time without any change to his standard of care. Copy of signed ICF and Hipaa provided to patient for his records. He was informed that he will receive a [employer] gift card of $50.00 after his next lab visit with central lab draw.  Jeral Fruit, RN, BSN, OCN [phone]

## 2018-09-21 NOTE — Progress Notes (Signed)
Pt in for follow up and results of recent scans.

## 2018-09-22 NOTE — Progress Notes (Signed)
Hematology/Oncology Consult note [facility] [phone] [phone]   Patient Care Team: Tonia Ghent, MD as PCP - General (Family Medicine) Thelma Comp, [place] (Optometry) Telford Nab, RN as Registered Nurse  REFERRING PROVIDER: Tonia Ghent, MD  CHIEF COMPLAINTS/REASON FOR VISIT:  Evaluation of malignant pleural effusion  HISTORY OF PRESENTING ILLNESS:   Craig Koch is a  63 y.o.  male with PMH listed below was seen in consultation at the request of  Tonia Ghent, MD  for evaluation of malignant pleural effusion. Patient was recently admitted to [facility] due to large amount of pleural effusion, acute hypoxic respiratory failure. Patient reports feeling shortness of breath and dyspnea on exertion for several weeks and presented to emergency room.  A chest x-ray 08/23/2018 showed a large amount of left pleural effusion 08/24/2018 CT chest abdomen pelvis confirmed large left pleural effusion with near complete collapse of the left lower lobe and extensive volume loss in the lingula and a portion of the left upper lobe. No acute intra-abdominal process.  Cholelithiasis, diverticulitis, coronary atherosclerosis post CABG.  Bilateral renal cortical scarring.  Aortic atherosclerosis.  #08/24/2018 ultrasound guided diagnostic and therapeutic thoracentesis removed 1.8 L of pleural fluid. Patient subsequently felt better and was discharged to follow-up with pulmonology. 09/04/2018 patient followed up with Dr.Aleskerov chest x-ray showed recurrent moderate left pleural effusion.  Patient underwent ultrasound-guided left thoracentesis again and it drained 3 L of fluid. Pleural fluid cytology positive for adenocarcinoma. Patient was referred to me for further evaluation and management.  Today patient reports shortness of breath has improved since the second thoracentesis. Denies any chest pain, abdominal pain.reports fatigue.  Has lost weight.   Appetite is fair.  INTERVAL HISTORY Craig Koch is a 63 y.o. male who has above history reviewed by me today presents for follow up visit for management of Stage IV non small cell lung cancer.  Problems and complaints are listed below: During interval patient had another thoracentesis and drained a total of 3 L of slightly blood-tinged fluid on 09/13/2018 He reports breathing is better today.  Denies any chest pain, fever, chills. Wife was called during encounter per patient's request. Continue to feel weak and tired.  Continue to have poor appetite and lost weight.   Review of Systems  Constitutional: Positive for appetite change, fatigue and unexpected weight change. Negative for chills and fever.  HENT:   Negative for hearing loss and voice change.   Eyes: Negative for eye problems and icterus.  Respiratory: Positive for shortness of breath. Negative for chest tightness and cough.   Cardiovascular: Negative for chest pain and leg swelling.  Gastrointestinal: Negative for abdominal distention and abdominal pain.  Endocrine: Negative for hot flashes.  Genitourinary: Negative for difficulty urinating, dysuria and frequency.   Musculoskeletal: Negative for arthralgias.  Skin: Negative for itching and rash.  Neurological: Negative for light-headedness and numbness.  Hematological: Negative for adenopathy. Does not bruise/bleed easily.  Psychiatric/Behavioral: Negative for confusion.    MEDICAL HISTORY:  Past Medical History:  Diagnosis Date   Anxiety    Arthritis    Coronary artery disease    Diabetes mellitus    Dyslipidemia    Hx of CABG    Hyperlipidemia    Hypertension    Pleural effusion     SURGICAL HISTORY: Past Surgical History:  Procedure Laterality Date   CATARACT EXTRACTION Left 09/2015   CORONARY ARTERY BYPASS GRAFT     (CABG with LIMA to the  LAD, SVG to OM2/OM3, SVG  to diag   Left ankle surgery     repair of fracture   Right lower leg  surgery     rod    SOCIAL HISTORY: Social History   Socioeconomic History   Marital status: Married    Spouse name: Not on file   Number of children: Not on file   Years of education: Not on file   Highest education level: Not on file  Occupational History   Not on file  Social Needs   Financial resource strain: Not on file   Food insecurity    Worry: Not on file    Inability: Not on file   Transportation needs    Medical: Not on file    Non-medical: Not on file  Tobacco Use   Smoking status: Current Every Day Smoker    Packs/day: 0.50    Types: Cigarettes    Last attempt to quit: 07/06/2013    Years since quitting: 5.2   Smokeless tobacco: Former Systems developer    Types: Snuff  Substance and Sexual Activity   Alcohol use: Yes    Alcohol/week: 6.0 standard drinks    Types: 6 Cans of beer per week    Comment: occ, average 6 pack in a week   Drug use: No   Sexual activity: Never  Lifestyle   Physical activity    Days per week: Not on file    Minutes per session: Not on file   Stress: Not on file  Relationships   Social connections    Talks on phone: Not on file    Gets together: Not on file    Attends religious service: Not on file    Active member of club or organization: Not on file    Attends meetings of clubs or organizations: Not on file    Relationship status: Not on file   Intimate partner violence    Fear of current or ex partner: Not on file    Emotionally abused: Not on file    Physically abused: Not on file    Forced sexual activity: Not on file  Other Topics Concern   Not on file  Social History Narrative   On disability 2009 after prev injuries and CAD.     Married 1976   2 kids, 4 grandkids.     FAMILY HISTORY: Family History  Problem Relation Age of Onset   Heart disease Father    Colon cancer Neg Hx    Prostate cancer Neg Hx    Diabetes Neg Hx     ALLERGIES:  is allergic to lipitor [atorvastatin calcium].  MEDICATIONS:   Current Outpatient Medications  Medication Sig Dispense Refill   albuterol (VENTOLIN HFA) 108 (90 Base) MCG/ACT inhaler Inhale 2 puffs into the lungs every 6 (six) hours as needed for wheezing or shortness of breath. 18 g 0   aspirin 325 MG tablet Take 325 mg by mouth daily.     BD INSULIN SYRINGE U/F 31G X 5/16" 0.5 ML MISC USE DAILY AS DIRECTED. DX E11.9 100 each 1   DULoxetine (CYMBALTA) 30 MG capsule Take 1 capsule (30 mg total) by mouth daily.     Fexofenadine HCl (ALLEGRA ALLERGY PO) Take 1 tablet by mouth daily as needed.     furosemide (LASIX) 20 MG tablet Take 1 tablet (20 mg total) by mouth daily. 30 tablet 0   glucose blood (ONE TOUCH ULTRA TEST) test strip USE TO TEST GLUCOSE LEVELS 4 (  FOUR) TIMES DAILY. 400 each 3   ibuprofen (ADVIL,MOTRIN) 200 MG tablet Take 400 mg by mouth every 6 (six) hours as needed.      insulin detemir (LEVEMIR) 100 UNIT/ML injection Inject 0.3 mLs (30 Units total) into the skin at bedtime. 10 mL 5   insulin lispro (HUMALOG) 100 UNIT/ML injection Inject 0.15 mLs (15 Units total) into the skin 3 (three) times daily with meals.     lisinopril (ZESTRIL) 2.5 MG tablet TAKE 1 TABLET BY MOUTH DAILY 90 tablet 2   metFORMIN (GLUCOPHAGE) 500 MG tablet TAKE 1 TABLET (500 MG TOTAL) BY MOUTH 2 (TWO) TIMES DAILY WITH A MEAL. 180 tablet 1   metoprolol tartrate (LOPRESSOR) 100 MG tablet TAKE 0.5 TABLETS (50 MG TOTAL) BY MOUTH 2 (TWO) TIMES DAILY. 90 tablet 1   Omega-3 Fatty Acids (FISH OIL) 1200 MG CAPS Take 2 capsules by mouth 3 (three) times daily with meals.     rosuvastatin (CRESTOR) 10 MG tablet TAKE 1 TABLET (10 MG TOTAL) BY MOUTH DAILY. 90 tablet 3   No current facility-administered medications for this visit.      PHYSICAL EXAMINATION: ECOG PERFORMANCE STATUS: 1 - Symptomatic but completely ambulatory Vitals:   09/21/18 1429  BP: 115/64  Pulse: 78  Resp: 18  Temp: (!) 96.7 F (35.9 C)  SpO2: 97%   Filed Weights   09/21/18 1429  Weight:  212 lb 14.4 oz (96.6 kg)    Physical Exam Constitutional:      General: He is not in acute distress. HENT:     Head: Normocephalic and atraumatic.  Eyes:     General: No scleral icterus.    Pupils: Pupils are equal, round, and reactive to light.  Neck:     Musculoskeletal: Normal range of motion and neck supple.  Cardiovascular:     Rate and Rhythm: Normal rate and regular rhythm.     Heart sounds: Normal heart sounds.  Pulmonary:     Effort: Pulmonary effort is normal. No respiratory distress.     Breath sounds: No wheezing.     Comments: Severely diminished breath sounds left lower lobe. Abdominal:     General: Bowel sounds are normal. There is no distension.     Palpations: Abdomen is soft. There is no mass.     Tenderness: There is no abdominal tenderness.  Musculoskeletal: Normal range of motion.        General: No deformity.  Skin:    General: Skin is warm and dry.     Findings: No erythema or rash.  Neurological:     Mental Status: He is alert and oriented to person, place, and time.     Cranial Nerves: No cranial nerve deficit.     Coordination: Coordination normal.  Psychiatric:        Behavior: Behavior normal.        Thought Content: Thought content normal.     LABORATORY DATA:  I have reviewed the data as listed Lab Results  Component Value Date   WBC 7.1 08/30/2018   HGB 15.0 08/30/2018   HCT 44.3 08/30/2018   MCV 97.8 08/30/2018   PLT 247.0 08/30/2018   Recent Labs    07/31/18 0942 08/23/18 1848 08/24/18 0801 08/30/18 1402  NA 139 137  --  141  K 4.3 3.9  --  4.2  CL 101 103  --  101  CO2 29 26  --  30  GLUCOSE 157* 185*  --  194*  BUN 23  18  --  20  CREATININE 0.89 0.88  --  0.87  CALCIUM 9.9 9.1  --  9.7  GFRNONAA  --  >60  --   --   GFRAA  --  >60  --   --   PROT 6.7  --  6.8 6.8  ALBUMIN 4.2  --   --  4.1  AST 12  --   --  12  ALT 10  --   --  8  ALKPHOS 61  --   --  66  BILITOT 0.6  --   --  0.6   Iron/TIBC/Ferritin/ %Sat No  results found for: IRON, TIBC, FERRITIN, IRONPCTSAT    RADIOGRAPHIC STUDIES: I have personally reviewed the radiological images as listed and agreed with the findings in the report.  Dg Chest 2 View  Result Date: 09/07/2018 CLINICAL DATA:  Decreased breath sounds in the left lower lobe. EXAM: CHEST - 2 VIEW COMPARISON:  September 05, 2018 FINDINGS: There is a persistent but improving left lower lobe airspace opacity. There is a persistent moderate left-sided pleural effusion. There is no pneumothorax. There is generalized volume overload. The patient is status post prior median sternotomy. The heart size is stable. There is elevation of the left hemidiaphragm. IMPRESSION: 1. Moderate left-sided pleural effusion. 2. Persistent airspace opacity involving the left lower lobe favored to represent atelectasis with an infiltrate not excluded. 3. No pneumothorax. Electronically Signed   By: Constance Holster M.D.   On: 09/07/2018 15:01   Dg Chest 2 View  Result Date: 08/30/2018 CLINICAL DATA:  Follow-up pleural effusion. EXAM: CHEST - 2 VIEW COMPARISON:  Chest radiographs 08/24/2018, 08/23/2018 FINDINGS: Stable cardiomediastinal contours status post median sternotomy and CABG. The right lung is clear. There are bandlike opacities in the left mid lung likely representing atelectasis. Persistent moderate sized left pleural effusion. No pneumothorax. No acute osseous abnormality in the visualized skeleton. IMPRESSION: Persistent moderate size left pleural effusion and associated left lung atelectasis. Electronically Signed   By: Audie Pinto M.D.   On: 08/30/2018 20:37   Dg Chest 2 View  Result Date: 08/23/2018 CLINICAL DATA:  Shortness of breath 2 weeks with productive cough. EXAM: CHEST - 2 VIEW COMPARISON:  None. FINDINGS: There is moderate opacification over the lower half of the left lung likely large effusion with associated basilar atelectasis. Fluid tracks into the apex. Right lung is clear. Left  heart border is obscured by the large left effusion. Visualized mediastinum is unremarkable. Degenerative change of the spine. IMPRESSION: Large left pleural effusion likely with associated basilar atelectasis. Electronically Signed   By: Marin Olp M.D.   On: 08/23/2018 19:13   Ct Chest W Contrast  Result Date: 08/24/2018 CLINICAL DATA:  Chest pain, shortness of breath, fusion suspected. Smoker, approximately 25 pack years EXAM: CT CHEST AND ABDOMEN WITH CONTRAST TECHNIQUE: Multidetector CT imaging of the chest and abdomen was performed following the standard protocol during bolus administration of intravenous contrast. CONTRAST:  172m OMNIPAQUE IOHEXOL 300 MG/ML  SOLN COMPARISON:  Same-day radiograph FINDINGS: CT CHEST FINDINGS Cardiovascular: Postsurgical changes related to prior CABG. Extensive calcification of the native coronary arteries. Heart is normal size. No pericardial effusion. Atherosclerotic plaque within the normal caliber aorta. Pulmonary arteries are normal caliber. Mediastinum/Nodes: Small amount stranding in the mediastinum likely related to prior CABG. Subcentimeter hypoattenuating nodule in the left thyroid lobe (2/3). Trachea is patent. Esophagus is unremarkable. Lungs/Pleura: There is a large left pleural effusion with near complete collapse of the  left lower lobe and extensive volume loss in the lingula and portions of the left upper lobe as well. There is abrupt tapering of the tracheobronchial tree is inter is into the airless portions of lung. Some ground-glass attenuation is noted adjacent to the fusion likely reflecting passive atelectatic change. In the right lung is clear. No pneumothorax. No visible nodules or suspicious masses though these are not fully excluded in the regions of airless lung. Musculoskeletal: Postsurgical changes from prior sternotomy with intact and aligned sutures. Multilevel degenerative changes are present in the imaged portions of the spine. Additional  degenerative changes in the shoulders. No acute or suspicious osseous lesion. No chest wall abnormality. CT ABDOMEN FINDINGS Hepatobiliary: No focal liver lesion. Few calcified gallstones layer dependently within the gallbladder. No biliary ductal dilatation. Pancreas: Unremarkable. No pancreatic ductal dilatation or surrounding inflammatory changes. Spleen: Normal in size without focal abnormality. Adrenals/Urinary Tract: Normal adrenal glands. Mild bilateral nonspecific perinephric stranding, a nonspecific finding though may correlate with either age or decreased renal function. Lobular appearance of the kidneys may reflect cortical scarring. Kidneys are otherwise unremarkable, without renal calculi, suspicious lesion, or hydronephrosis. Bladder is unremarkable. Stomach/Bowel: Distal esophagus, stomach and duodenal sweep are unremarkable. No bowel wall thickening or dilatation. No evidence of obstruction. A normal appendix is visualized. Scattered colonic diverticula without focal pericolonic inflammation to suggest diverticulitis. Vascular/Lymphatic: Extensive atheromatous plaque throughout the aorta and branch vessels. [place] of the renal arteries are heavily diseased of the branches opacify normally. Normal opacification of the portal venous Other: No abdominopelvic free fluid or free gas. No bowel containing hernias. Small amount of soft tissue infiltration in the low anterior abdomen. Musculoskeletal: Multilevel degenerative changes are present in the imaged portions of the spine. Few remote appearing compression deformities in the lumbar spine at L2, L4 and L5 as well as a limbus vertebrae at L3 could correlate for point tenderness however IMPRESSION: 1. Large left pleural effusion with near complete collapse of the left lower lobe and extensive volume loss in the lingula and portions of the left upper lobe. Underlying consolidation or lesion cannot be excluded. 2. No acute intra-abdominal process. 3.  Cholelithiasis without evidence of acute cholecystitis. 4. Diverticulosis without evidence of acute diverticulitis. 5. Coronary atherosclerosis, post CABG. 6. Bilateral renal cortical scarring 7. Aortic Atherosclerosis (ICD10-I70.0). Electronically Signed   By: Lovena Le M.D.   On: 08/24/2018 00:19   Mr Jeri Cos GQ Contrast  Result Date: 09/21/2018 CLINICAL DATA:  Malignant neoplasm of unspecified part of unspecified bronchus or lung. Lung cancer, non-small cell, staging EXAM: MRI HEAD WITHOUT AND WITH CONTRAST TECHNIQUE: Multiplanar, multiecho pulse sequences of the brain and surrounding structures were obtained without and with intravenous contrast. CONTRAST:  57m GADAVIST GADOBUTROL 1 MMOL/ML IV SOLN COMPARISON:  No pertinent prior studies available for comparison. FINDINGS: Brain: A punctate focus of enhancement within the right parietal lobe cortex is suspicious for possible metastasis (series 18, image 114). No other foci of abnormal intracranial enhancement are identified. There is no acute infarct. No midline shift or extra-axial fluid collection. No chronic intracranial hemorrhage. Mild scattered T2/FLAIR hyperintensity within the cerebral white matter and brainstem is nonspecific, but consistent with chronic small vessel ischemic disease. Mild generalized parenchymal atrophy. Vascular: Flow voids maintained within the proximal large arterial vessels. Skull and upper cervical spine: Normal marrow signal. Sinuses/Orbits: Imaged globes and orbits demonstrate no acute abnormality. Mild mucosal thickening within a small right maxillary sinus. No significant mastoid effusion. IMPRESSION: Punctate focus of enhancement within  the right parietal lobe cortex suspicious for possible metastasis. Three-month contrast-enhanced MRI follow-up is recommended. Mild generalized parenchymal atrophy and chronic small vessel ischemic disease. Right maxillary sinus disease as described. Electronically Signed   By: Kellie Simmering   On: 09/21/2018 10:00   Ct Abdomen Pelvis W Contrast  Result Date: 08/24/2018 CLINICAL DATA:  Chest pain, shortness of breath, fusion suspected. Smoker, approximately 25 pack years EXAM: CT CHEST AND ABDOMEN WITH CONTRAST TECHNIQUE: Multidetector CT imaging of the chest and abdomen was performed following the standard protocol during bolus administration of intravenous contrast. CONTRAST:  131m OMNIPAQUE IOHEXOL 300 MG/ML  SOLN COMPARISON:  Same-day radiograph FINDINGS: CT CHEST FINDINGS Cardiovascular: Postsurgical changes related to prior CABG. Extensive calcification of the native coronary arteries. Heart is normal size. No pericardial effusion. Atherosclerotic plaque within the normal caliber aorta. Pulmonary arteries are normal caliber. Mediastinum/Nodes: Small amount stranding in the mediastinum likely related to prior CABG. Subcentimeter hypoattenuating nodule in the left thyroid lobe (2/3). Trachea is patent. Esophagus is unremarkable. Lungs/Pleura: There is a large left pleural effusion with near complete collapse of the left lower lobe and extensive volume loss in the lingula and portions of the left upper lobe as well. There is abrupt tapering of the tracheobronchial tree is inter is into the airless portions of lung. Some ground-glass attenuation is noted adjacent to the fusion likely reflecting passive atelectatic change. In the right lung is clear. No pneumothorax. No visible nodules or suspicious masses though these are not fully excluded in the regions of airless lung. Musculoskeletal: Postsurgical changes from prior sternotomy with intact and aligned sutures. Multilevel degenerative changes are present in the imaged portions of the spine. Additional degenerative changes in the shoulders. No acute or suspicious osseous lesion. No chest wall abnormality. CT ABDOMEN FINDINGS Hepatobiliary: No focal liver lesion. Few calcified gallstones layer dependently within the gallbladder. No biliary  ductal dilatation. Pancreas: Unremarkable. No pancreatic ductal dilatation or surrounding inflammatory changes. Spleen: Normal in size without focal abnormality. Adrenals/Urinary Tract: Normal adrenal glands. Mild bilateral nonspecific perinephric stranding, a nonspecific finding though may correlate with either age or decreased renal function. Lobular appearance of the kidneys may reflect cortical scarring. Kidneys are otherwise unremarkable, without renal calculi, suspicious lesion, or hydronephrosis. Bladder is unremarkable. Stomach/Bowel: Distal esophagus, stomach and duodenal sweep are unremarkable. No bowel wall thickening or dilatation. No evidence of obstruction. A normal appendix is visualized. Scattered colonic diverticula without focal pericolonic inflammation to suggest diverticulitis. Vascular/Lymphatic: Extensive atheromatous plaque throughout the aorta and branch vessels. [place] of the renal arteries are heavily diseased of the branches opacify normally. Normal opacification of the portal venous Other: No abdominopelvic free fluid or free gas. No bowel containing hernias. Small amount of soft tissue infiltration in the low anterior abdomen. Musculoskeletal: Multilevel degenerative changes are present in the imaged portions of the spine. Few remote appearing compression deformities in the lumbar spine at L2, L4 and L5 as well as a limbus vertebrae at L3 could correlate for point tenderness however IMPRESSION: 1. Large left pleural effusion with near complete collapse of the left lower lobe and extensive volume loss in the lingula and portions of the left upper lobe. Underlying consolidation or lesion cannot be excluded. 2. No acute intra-abdominal process. 3. Cholelithiasis without evidence of acute cholecystitis. 4. Diverticulosis without evidence of acute diverticulitis. 5. Coronary atherosclerosis, post CABG. 6. Bilateral renal cortical scarring 7. Aortic Atherosclerosis (ICD10-I70.0). Electronically  Signed   By: PLovena LeM.D.   On: 08/24/2018 00:19  Nm Pet Image Initial (pi) Skull Base To Thigh  Result Date: 09/18/2018 CLINICAL DATA:  Initial treatment strategy for lung nodule. Staging. Left-sided thoracentesis demonstrating adenocarcinoma EXAM: NUCLEAR MEDICINE PET SKULL BASE TO THIGH TECHNIQUE: 11.5 mCi F-18 FDG was injected intravenously. Full-ring PET imaging was performed from the skull base to thigh after the radiotracer. CT data was obtained and used for attenuation correction and anatomic localization. Fasting blood glucose: 129 mg/dl COMPARISON:  08/24/2018 chest abdomen and pelvic CTs. FINDINGS: Mediastinal blood pool activity: SUV max 2.3 Liver activity: SUV max NA NECK: No areas of abnormal hypermetabolism. Incidental CT findings: Mucosal thickening right maxillary sinus. Bilateral carotid atherosclerosis. No cervical adenopathy. CHEST: Low-level hypermetabolism corresponding to areas of patchy consolidation within the central left upper and less so medial left lower lobe. Example within the central left upper lobe relatively diffusely at a S.U.V. max of 5.2 including on image 105/3. The most focal area of hypermetabolism and nodularity is along the pleural surface adjacent the medial right upper lobe. This measures on the order of a S.U.V. max of 5.5, including on image 90/3. Multifocal parietal pleural hypermetabolism, including corresponding to nodularity at 1.1 cm and a S.U.V. max of 4.1 on 87/3. Incidental CT findings: Left-sided hydropneumothorax. The pleural air component is small to moderate. The amount of left-sided pleural fluid is relatively similar to the CT of 08/23/2018. Emphysema. Aortic and coronary artery atherosclerosis with prior median sternotomy. ABDOMEN/PELVIS: No abdominopelvic parenchymal or nodal hypermetabolism. Incidental CT findings: Normal adrenal glands. Left renal vascular calcification. Dependent gallstone or stones. Abdominal aortic atherosclerosis.  SKELETON: No abnormal marrow activity. Incidental CT findings: none IMPRESSION: 1. Left-sided pleural hypermetabolism, consistent with malignant pleural effusion. 2. Left upper and less so left lower lobe low-level hypermetabolism corresponding to areas of airspace and ground-glass opacity. This is primarily felt to represent atelectasis. Underlying left upper lobe primary bronchogenic carcinoma cannot be excluded. This is suboptimally evaluated secondary to the extent of volume loss from hydropneumothorax. 3. A medial left upper lobe area of more focal hypermetabolism could also represent pleural disease or a site of a small left upper lobe primary. 4. No evidence of extrathoracic hypermetabolic metastasis. 5. Left-sided hydropneumothorax with the pleural air component being new since the prior CT. 6. Cholelithiasis. These results will be called to the ordering clinician or representative by the Radiologist Assistant, and communication documented in the PACS or zVision Dashboard. Electronically Signed   By: Abigail Miyamoto M.D.   On: 09/18/2018 15:26   Dg Chest Port 1 View  Result Date: 09/13/2018 CLINICAL DATA:  History of lung cancer now with recurrent symptomatic left-sided pleural effusion, post thoracentesis. EXAM: PORTABLE CHEST 1 VIEW COMPARISON:  09/07/2018 09/05/2018; chest CT-08/23/2018; ultrasound-guided thoracentesis - 09/05/2018 yielding 3 L pleural fluid FINDINGS: Grossly unchanged cardiac silhouette and mediastinal contours post median sternotomy and CABG. Stable postsurgical change of the left hilum. Interval reduction/near resolution of left-sided pleural effusion post large volume left-sided thoracentesis. Improved aeration of left lung base with persistent left perihilar and basilar heterogeneous/consolidative opacities. Mild pulmonary is congestion without frank evidence of edema. The right lung remains well aerated. No pneumothorax. No acute osseous abnormalities. IMPRESSION: 1. Interval  reduction/near resolution left-sided effusion post thoracentesis. No pneumothorax. 2. Improved aeration of the left lung with persistent left perihilar and basilar opacities, likely atelectasis. Electronically Signed   By: Sandi Mariscal M.D.   On: 09/13/2018 09:05   Dg Chest Port 1 View  Result Date: 09/05/2018 CLINICAL DATA:  Status post left thoracentesis EXAM:  PORTABLE CHEST 1 VIEW COMPARISON:  08/30/2018 FINDINGS: Cardiac shadow is stable. Postsurgical changes are again noted. Persistent scarring is noted in the left lung base with minimal residual effusion following thoracentesis. No pneumothorax is noted. No bony abnormality is seen. IMPRESSION: No pneumothorax following left-sided thoracentesis. Electronically Signed   By: Inez Catalina M.D.   On: 09/05/2018 12:20   Dg Chest Port 1 View  Result Date: 08/24/2018 CLINICAL DATA:  Status post left thoracentesis EXAM: PORTABLE CHEST 1 VIEW COMPARISON:  08/23/2018 FINDINGS: Cardiac shadow is stable. Postsurgical changes are again noted. The right lung is clear. Left lung demonstrates areas of collapse lung as well as mild residual effusion. No pneumothorax is identified IMPRESSION: No evidence of pneumothorax following thoracentesis. Decreased left pleural effusion is noted. Electronically Signed   By: Inez Catalina M.D.   On: 08/24/2018 16:03   US Thoracentesis Asp Pleural Space W/img Guide  Result Date: 09/13/2018 INDICATION: History of lung cancer, now with recurrent left-sided pleural effusion. Patient presents today for ultrasound-guided left-sided thoracentesis for therapeutic purposes. EXAM: US THORACENTESIS ASP PLEURAL SPACE W/IMG GUIDE COMPARISON:  Previous large volume left-sided thoracentesis performed 09/05/2018 (yielding 3 L) and 08/24/2018 (yielding 1.8 L). MEDICATIONS: None. COMPLICATIONS: None immediate. TECHNIQUE: Informed written consent was obtained from the patient after a discussion of the risks, benefits and alternatives to treatment. A  timeout was performed prior to the initiation of the procedure. Initial ultrasound scanning demonstrates a large recurrent anechoic left-sided pleural effusion. The lower chest was prepped and draped in the usual sterile fashion. 1% lidocaine was used for local anesthesia. An ultrasound image was saved for documentation purposes. An 8 Fr Safe-T-Centesis catheter was introduced. The thoracentesis was performed. The catheter was removed and a dressing was applied. The patient tolerated the procedure well without immediate post procedural complication. The patient was escorted to have an upright chest radiograph. FINDINGS: A total of approximately 3 liters of slightly blood-tinged fluid was removed. IMPRESSION: Successful ultrasound-guided left sided thoracentesis yielding 3 liters of pleural fluid. Electronically Signed   By: Sandi Mariscal M.D.   On: 09/13/2018 09:08   US Thoracentesis Asp Pleural Space W/img Guide  Result Date: 09/05/2018 INDICATION: Left-sided recurrent pleural effusion EXAM: ULTRASOUND GUIDED THORACENTESIS MEDICATIONS: None. COMPLICATIONS: None immediate. PROCEDURE: An ultrasound guided thoracentesis was thoroughly discussed with the patient and questions answered. The benefits, risks, alternatives and complications were also discussed. The patient understands and wishes to proceed with the procedure. Written consent was obtained. Ultrasound was performed to localize and mark an adequate pocket of fluid in the left chest. The area was then prepped and draped in the normal sterile fashion. 1% Lidocaine was used for local anesthesia. Under ultrasound guidance, a 6 Fr Safe-T-Centesis catheter was introduced. Thoracentesis was performed. The catheter was removed and a dressing applied. FINDINGS: A total of approximately 3 L of sanguinous fluid was removed. Samples were sent to the laboratory as requested by the clinical team. IMPRESSION: Successful ultrasound guided left thoracentesis yielding 3 L of  pleural fluid. Electronically Signed   By: Inez Catalina M.D.   On: 09/05/2018 12:21   US Thoracentesis Asp Pleural Space W/img Guide  Result Date: 08/24/2018 INDICATION: 63 year old with large left pleural effusion and shortness of breath. EXAM: ULTRASOUND GUIDED LEFT THORACENTESIS MEDICATIONS: None. COMPLICATIONS: None immediate. PROCEDURE: An ultrasound guided thoracentesis was thoroughly discussed with the patient and questions answered. The benefits, risks, alternatives and complications were also discussed. The patient understands and wishes to proceed with the procedure. Written consent  was obtained. Ultrasound was performed to localize and mark an adequate pocket of fluid in the left chest. The area was then prepped and draped in the normal sterile fashion. 1% Lidocaine was used for local anesthesia. Under ultrasound guidance a 6 Fr Safe-T-Centesis catheter was introduced. Thoracentesis was performed. The catheter was removed and a dressing applied. FINDINGS: A total of approximately 1.8 L of red fluid was removed. Samples were sent to the laboratory as requested by the clinical team. IMPRESSION: Successful ultrasound guided left thoracentesis yielding 1.8 L of pleural fluid. Electronically Signed   By: Markus Daft M.D.   On: 08/24/2018 12:29      ASSESSMENT & PLAN:  1. Recurrent pleural effusion on left   2. lung adenocarcinoma   3. Goals of care, counseling/discussion   4. Tobacco abuse    #Metastatic lung adenocarcinoma TxNx M1 Malignant pleural effusion, M1 disease. 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 PET scan MRI brain images were independent reviewed and discussed with patient. Patient has left sided pleural hypermetabolic him, consistent with malignant pleural effusion. Left upper and lower lobe low-level hypermetabolic him corresponding to areas of airspace and groundglass opacity.  This was felt to be secondary to atelectasis.  Underlying left upper lobe primary bronchogenic carcinoma  cannot be excluded. Medial left upper lobe area more focal hypermetabolic area could represent a pleural disease or a site of small left upper lobe primary. No evidence of extra thoracic hypermetabolic metastatic disease.  Left sided hydro-pneumothorax within the pleural air component being new since the prior CT. Cholelithiasis.  #MRI brain showed punctate focus of enhancement within the right parietal lobe cortex suspicious for possible metastasis. I had a lengthy discussion with both the patient in the clinic and with his wife over the phone and discussed the PET scan findings, pathology, stage IV cancer diagnosis. Goal of care: Discussed with patient that stage IV lung cancer is not curable.  Goal of treatment is to palliate his symptoms, and prolong his life span.  #Recurrent pleural effusion, previously discussed with patient about Pleurx catheter placement.  Patient declined. Today we discussed again about the rationale and he agrees to have a discussion with surgeon. Refer to Dr. Genevive Bi I also discussed with patient about having a Mediport placed for future chemotherapy. Discussed about the rationale of systemic palliative chemotherapy which may reduce pleural effusion production and palliate his symptoms. Depending on his PDL-1 status, he maybe eligible to [facility] 007 trial, with Levatinib vs [place] treatment. If not, consider Carboplatin/Taxol/Avastin regimen.  Despite lengthy discussion, patient does not want to be placed in chemotherapy class yet.  He wants to meet surgery first and discuss about Pleurx catheter and possible Mediport placement.   #CNS metastasis, recommend patient establish care with radiation oncology Dr. Baruch Gouty.  He declines to be referred today.  He would prefer to rediscuss this at the next visit. Recommend patient to establish care with palliative service.  #Need to send NGS Omniseq testing  # Smoke cessation discussed with patient.  Supportive care measures are  necessary for patient well-being and will be provided as necessary. We spent sufficient time to discuss many aspect of care, questions were answered to patient's satisfaction. Total face to face encounter time for this patient visit was 40 min. >50% of the time was  spent in counseling and coordination of care.    Orders Placed This Encounter  Procedures   Ambulatory referral to General Surgery    Referral Priority:   Routine    Referral  Type:   Surgical    Referral Reason:   Specialty Services Required    Referred to Provider:   [name], MD    Requested Specialty:   General Surgery    Number of Visits Requested:   1    All questions were answered. The patient knows to call the clinic with any problems questions or concerns.   Return of visit: to be determined,   Earlie Server, MD, PhD Hematology Oncology [facility] at [facility] Pager- [phone] 09/22/2018

## 2018-09-23 DIAGNOSIS — Z029 Encounter for administrative examinations, unspecified: Secondary | ICD-10-CM | POA: Insufficient documentation

## 2018-09-23 NOTE — Assessment & Plan Note (Addendum)
DMV forms done.  See scanned forms.    History of coronary disease.  Bypass in 2004.  No pacemaker.  No arrhythmias.  No defibrillator.  History diabetes.  A1c 8.9.  No hypoglycemia that would prevent driving.  History of anxiety.  Controlled.  No adverse effect on medication.  History of DUI in the distant past.  Very rare alcohol now.  No issues with driving now.  He does not drive if he has had any alcohol.  See scanned forms.  Lung cancer.  Normal oxygen saturation.  Has follow-up pending.  Still able to drive, with his current functional capacity.  No contraindication to driving this point.  Forms done.  Update me as needed.  He agrees.  He will send the original forms to the [facility].  I will await pulmonary follow-up notes.

## 2018-09-27 ENCOUNTER — Other Ambulatory Visit: Payer: Self-pay

## 2018-09-27 ENCOUNTER — Telehealth: Payer: Self-pay | Admitting: *Deleted

## 2018-09-27 DIAGNOSIS — C3490 Malignant neoplasm of unspecified part of unspecified bronchus or lung: Secondary | ICD-10-CM

## 2018-09-27 NOTE — Telephone Encounter (Signed)
Wife called requesting a return call from Dr Tasia Catchings as she has some questions about his cancer and treatment she thought of after her discussion the other day. Please retuenher call [phone]. She would nit tell me her specific questions.

## 2018-09-28 ENCOUNTER — Telehealth: Payer: Self-pay | Admitting: Cardiothoracic Surgery

## 2018-09-28 ENCOUNTER — Ambulatory Visit (INDEPENDENT_AMBULATORY_CARE_PROVIDER_SITE_OTHER): Payer: Medicare Other | Admitting: Cardiothoracic Surgery

## 2018-09-28 ENCOUNTER — Other Ambulatory Visit: Payer: Self-pay

## 2018-09-28 ENCOUNTER — Encounter
Admission: RE | Admit: 2018-09-28 | Discharge: 2018-09-28 | Disposition: A | Discharge status: Home / Self Care | Payer: Medicare Other | Source: Ambulatory Visit | Attending: Cardiothoracic Surgery | Admitting: Cardiothoracic Surgery

## 2018-09-28 ENCOUNTER — Encounter: Payer: Self-pay | Admitting: Cardiothoracic Surgery

## 2018-09-28 ENCOUNTER — Ambulatory Visit
Admission: RE | Admit: 2018-09-28 | Discharge: 2018-09-28 | Disposition: A | Discharge status: Home / Self Care | Payer: Medicare Other | Source: Ambulatory Visit | Attending: Cardiothoracic Surgery | Admitting: Cardiothoracic Surgery

## 2018-09-28 VITALS — BP 154/91 | HR 111 | Temp 97.3°F | Resp 18 | Ht 72.0 in | Wt 213.0 lb

## 2018-09-28 DIAGNOSIS — E785 Hyperlipidemia, unspecified: Secondary | ICD-10-CM | POA: Diagnosis not present

## 2018-09-28 DIAGNOSIS — J910 Malignant pleural effusion: Secondary | ICD-10-CM | POA: Diagnosis not present

## 2018-09-28 DIAGNOSIS — Z794 Long term (current) use of insulin: Secondary | ICD-10-CM | POA: Diagnosis not present

## 2018-09-28 DIAGNOSIS — I10 Essential (primary) hypertension: Secondary | ICD-10-CM | POA: Diagnosis not present

## 2018-09-28 DIAGNOSIS — Z01810 Encounter for preprocedural cardiovascular examination: Secondary | ICD-10-CM

## 2018-09-28 DIAGNOSIS — Z8249 Family history of ischemic heart disease and other diseases of the circulatory system: Secondary | ICD-10-CM | POA: Diagnosis not present

## 2018-09-28 DIAGNOSIS — J90 Pleural effusion, not elsewhere classified: Secondary | ICD-10-CM | POA: Diagnosis not present

## 2018-09-28 DIAGNOSIS — Z951 Presence of aortocoronary bypass graft: Secondary | ICD-10-CM | POA: Diagnosis not present

## 2018-09-28 DIAGNOSIS — E119 Type 2 diabetes mellitus without complications: Secondary | ICD-10-CM | POA: Diagnosis not present

## 2018-09-28 DIAGNOSIS — R0602 Shortness of breath: Secondary | ICD-10-CM | POA: Diagnosis not present

## 2018-09-28 DIAGNOSIS — Z7982 Long term (current) use of aspirin: Secondary | ICD-10-CM | POA: Diagnosis not present

## 2018-09-28 DIAGNOSIS — C3490 Malignant neoplasm of unspecified part of unspecified bronchus or lung: Secondary | ICD-10-CM | POA: Diagnosis not present

## 2018-09-28 DIAGNOSIS — Z888 Allergy status to other drugs, medicaments and biological substances status: Secondary | ICD-10-CM | POA: Diagnosis not present

## 2018-09-28 DIAGNOSIS — Z20828 Contact with and (suspected) exposure to other viral communicable diseases: Secondary | ICD-10-CM | POA: Insufficient documentation

## 2018-09-28 DIAGNOSIS — I2510 Atherosclerotic heart disease of native coronary artery without angina pectoris: Secondary | ICD-10-CM | POA: Diagnosis not present

## 2018-09-28 DIAGNOSIS — Z01812 Encounter for preprocedural laboratory examination: Secondary | ICD-10-CM | POA: Insufficient documentation

## 2018-09-28 LAB — COMPREHENSIVE METABOLIC PANEL WITH GFR
ALT: 15 U/L (ref 0–44)
AST: 13 U/L — ABNORMAL LOW (ref 15–41)
Albumin: 3.3 g/dL — ABNORMAL LOW (ref 3.5–5.0)
Alkaline Phosphatase: 102 U/L (ref 38–126)
Anion gap: 14 (ref 5–15)
BUN: 14 mg/dL (ref 8–23)
CO2: 25 mmol/L (ref 22–32)
Calcium: 8.9 mg/dL (ref 8.9–10.3)
Chloride: 100 mmol/L (ref 98–111)
Creatinine, Ser: 0.78 mg/dL (ref 0.61–1.24)
GFR calc Af Amer: >60 mL/min
GFR calc non Af Amer: >60 mL/min
Glucose, Bld: 294 mg/dL — ABNORMAL HIGH (ref 70–99)
Potassium: 4.4 mmol/L (ref 3.5–5.1)
Sodium: 139 mmol/L (ref 135–145)
Total Bilirubin: 1.1 mg/dL (ref 0.3–1.2)
Total Protein: 6.8 g/dL (ref 6.5–8.1)

## 2018-09-28 LAB — CBC WITH DIFFERENTIAL/PLATELET
Abs Immature Granulocytes: 0.07 10*3/uL (ref 0.00–0.07)
Basophils Absolute: 0.1 10*3/uL (ref 0.0–0.1)
Basophils Relative: 1 %
Eosinophils Absolute: 0.3 10*3/uL (ref 0.0–0.5)
Eosinophils Relative: 3 %
HCT: 42.6 % (ref 39.0–52.0)
Hemoglobin: 14.4 g/dL (ref 13.0–17.0)
Immature Granulocytes: 1 %
Lymphocytes Relative: 10 %
Lymphs Abs: 1 10*3/uL (ref 0.7–4.0)
MCH: 32.5 pg (ref 26.0–34.0)
MCHC: 33.8 g/dL (ref 30.0–36.0)
MCV: 96.2 fL (ref 80.0–100.0)
Monocytes Absolute: 1.1 10*3/uL — ABNORMAL HIGH (ref 0.1–1.0)
Monocytes Relative: 11 %
Neutro Abs: 7.6 10*3/uL (ref 1.7–7.7)
Neutrophils Relative %: 74 %
Platelets: 331 10*3/uL (ref 150–400)
RBC: 4.43 MIL/uL (ref 4.22–5.81)
RDW: 12.8 % (ref 11.5–15.5)
WBC: 10.1 10*3/uL (ref 4.0–10.5)
nRBC: 0 % (ref 0.0–0.2)

## 2018-09-28 LAB — SARS CORONAVIRUS 2 (TAT 6-24 HRS): SARS Coronavirus 2: NEGATIVE

## 2018-09-28 NOTE — Telephone Encounter (Signed)
Information discussed in the office with patient today.   Pt has been advised of pre admission date/time, Covid Testing date and Surgery date.  Surgery Date: 10/01/18 with Dr Ovidio Hanger of left sided pleurx cath.  Preadmission Testing Date: Today Covid Testing Date: Today - patient advised to [facility]  [employer] Video sent via [employer] [facility] and [employer].  Patient has been made aware to call [phone], between 1-3:00pm the day before surgery, to find out what time to arrive.

## 2018-09-28 NOTE — Progress Notes (Signed)
Patient ID: Craig Koch, male   DOB: May 01, 1955, 63 y.o.   MRN: [id number]  Chief Complaint  Patient presents with  . New Patient (Initial Visit)    Pleurx catheter insetion and port a cath    Referred By Dr. Tasia Catchings oncology Reason for Referral left-sided pleural effusion  HPI Location, Quality, Duration, Severity, Timing, Context, Modifying Factors, Associated Signs and Symptoms.  Craig Koch is a 63 y.o. male.  His problems began about a month ago when he began experiencing increasing shortness of breath over his baseline.  He went to the emergency department where a large left-sided pleural effusion was identified and had a chest x-ray and CT scan.  He has had a complete evaluation by our oncology team headed by Dr. 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  He is undergone 3 separate thoracenteses and comes in today to discuss Pleurx catheter insertion.  The patient was also to discuss Port-A-Cath placement but he has declined any chemotherapy stating that because of his advanced stage of disease he would rather forego that for quality of life.  Each time he has had his pleural effusion drained he is felt significantly better.  He did denies any cough.  He denies any chest pain.  He is short of breath now.  He feels that his pleural effusion has recurred.  He smokes about 6 cigarettes/day.   Past Medical History:  Diagnosis Date  . Anxiety   . Arthritis   . Coronary artery disease   . Diabetes mellitus   . Dyslipidemia   . Hx of CABG   . Hyperlipidemia   . Hypertension   . Pleural effusion     Past Surgical History:  Procedure Laterality Date  . CATARACT EXTRACTION Left 09/2015  . CORONARY ARTERY BYPASS GRAFT     (CABG with LIMA to the  LAD, SVG to OM2/OM3, SVG  to diag  . Left ankle surgery     repair of fracture  . Right lower leg surgery     rod    Family History  Problem  Relation Age of Onset  . Dementia Mother   . Heart disease Father   . Colon cancer Neg Hx   . Prostate cancer Neg Hx   . Diabetes Neg Hx     Social History Social History   Tobacco Use  . Smoking status: Current Every Day Smoker    Packs/day: 0.25    Years: 45.00    Pack years: 11.25    Types: Cigarettes  . Smokeless tobacco: Former Systems developer    Types: Snuff  Substance Use Topics  . Alcohol use: Yes    Alcohol/week: 6.0 standard drinks    Types: 6 Cans of beer per week    Comment: occ, average 6 pack in a week  . Drug use: No    Allergies  Allergen Reactions  . Lipitor [Atorvastatin Calcium] Other (See Comments)    Aches.  Tolerated crestor.     Current Outpatient Medications  Medication Sig Dispense Refill  . albuterol (VENTOLIN HFA) 108 (90 Base) MCG/ACT inhaler Inhale 2 puffs into the lungs every 6 (six) hours as needed for wheezing or shortness of breath. 18 g 0  . aspirin 325 MG tablet Take 325 mg by mouth daily.    . BD INSULIN SYRINGE U/F 31G X 5/16" 0.5 ML MISC USE DAILY AS DIRECTED. DX E11.9 100 each 1  .  DULoxetine (CYMBALTA) 30 MG capsule Take 1 capsule (30 mg total) by mouth daily.    [name] Fexofenadine HCl (ALLEGRA ALLERGY PO) Take 1 tablet by mouth daily as needed.    . furosemide (LASIX) 20 MG tablet Take 1 tablet (20 mg total) by mouth daily. 30 tablet 0  . glucose blood (ONE TOUCH ULTRA TEST) test strip USE TO TEST GLUCOSE LEVELS 4 (FOUR) TIMES DAILY. 400 each 3  . ibuprofen (ADVIL,MOTRIN) 200 MG tablet Take 400 mg by mouth every 6 (six) hours as needed.     . insulin detemir (LEVEMIR) 100 UNIT/ML injection Inject 0.3 mLs (30 Units total) into the skin at bedtime. 10 mL 5  . insulin lispro (HUMALOG) 100 UNIT/ML injection Inject 0.15 mLs (15 Units total) into the skin 3 (three) times daily with meals.    [name] lisinopril (ZESTRIL) 2.5 MG tablet TAKE 1 TABLET BY MOUTH DAILY 90 tablet 2  . metFORMIN (GLUCOPHAGE) 500 MG tablet TAKE 1 TABLET (500 MG TOTAL) BY MOUTH 2  (TWO) TIMES DAILY WITH A MEAL. 180 tablet 1  . metoprolol tartrate (LOPRESSOR) 100 MG tablet TAKE 0.5 TABLETS (50 MG TOTAL) BY MOUTH 2 (TWO) TIMES DAILY. 90 tablet 1  . Omega-3 Fatty Acids (FISH OIL) 1200 MG CAPS Take 2 capsules by mouth 3 (three) times daily with meals.    . rosuvastatin (CRESTOR) 10 MG tablet TAKE 1 TABLET (10 MG TOTAL) BY MOUTH DAILY. 90 tablet 3   No current facility-administered medications for this visit.       Review of Systems A complete review of systems was asked and was negative except for the following positive findings shortness of breath, arthritis, joint pain.  Blood pressure (!) 154/91, pulse (!) 111, temperature (!) 97.3 F (36.3 C), temperature source Temporal, resp. rate 18, height 6' (1.829 m), weight 213 lb (96.6 kg), SpO2 96 %.  Physical Exam CONSTITUTIONAL:  Pleasant, well-developed, well-nourished, and in no acute distress. EYES: Pupils equal and reactive to light, Sclera non-icteric EARS, NOSE, MOUTH AND THROAT:  The oropharynx was clear.  Dentition is poor repair upper dentures.  Oral mucosa pink and moist. LYMPH NODES:  Lymph nodes in the neck and axillae were normal RESPIRATORY:  Lungs were distant bilaterally and slightly diminished on the left.  Normal respiratory effort without pathologic use of accessory muscles of respiration CARDIOVASCULAR: Heart was regular without murmurs.  There were no carotid bruits. GI: The abdomen was soft, nontender, and nondistended. There were no palpable masses. There was no hepatosplenomegaly. There were normal bowel sounds in all quadrants. GU:  Rectal deferred.   MUSCULOSKELETAL:  Normal muscle strength and tone.  No clubbing or cyanosis.   SKIN:  There were no pathologic skin lesions.  There were no nodules on palpation. NEUROLOGIC:  Sensation is normal.  Cranial nerves are grossly intact. PSYCH:  Oriented to person, place and time.  Mood and affect are normal.  Data Reviewed Multiple imaging  studies  I have personally reviewed the patient's imaging, laboratory findings and medical records.    Assessment    Left-sided malignant pleural effusion.  His most recent imaging study was a PET scan showing a left hydropneumothorax.  There is dense consolidation in the upper lobe.    Plan    I had a long discussion today with the patient regarding his Port-A-Cath insertion.  He is fairly adamant he does not want any chemotherapy and therefore we will hold off on that.  I did review with him the indications  and risks of Pleurx catheter insertion including risks of bleeding, infection, lung injury and death.  We also discussed the long-term management of the catheter including and possibility of infection and/or dysfunction.  Overall he would like [place] to proceed.      [name], MD 09/28/2018, 8:44 AM

## 2018-09-28 NOTE — Patient Instructions (Signed)
Your procedure is scheduled on: Mon 9/28 Report to Day Surgery. To find out your arrival time please call [phone] between 1PM - 3PM on today.  Remember: Instructions that are not followed completely may result in serious medical risk,  up to and including death, or upon the discretion of your surgeon and anesthesiologist your  surgery may need to be rescheduled.     _X__ 1. Do not eat food after midnight the night before your procedure.                 No gum chewing or hard candies. You may drink clear liquids up to 2 hours                 before you are scheduled to arrive for your surgery- DO not drink clear                 liquids within 2 hours of the start of your surgery.                 Clear Liquids include:  water, apple juice without pulp, clear carbohydrate                 drink such as Clearfast of Gatorade, Black Coffee or Tea (Do not add                 anything to coffee or tea).  __X__2.  On the morning of surgery brush your teeth with toothpaste and water, you                may rinse your mouth with mouthwash if you wish.  Do not swallow any toothpaste of mouthwash.     _X__ 3.  No Alcohol for 24 hours before or after surgery.   _X__ 4.  Do Not Smoke or use e-cigarettes For 24 Hours Prior to Your Surgery.                 Do not use any chewable tobacco products for at least 6 hours prior to                 surgery.  ____  5.  Bring all medications with you on the day of surgery if instructed.   __x__  6.  Notify your doctor if there is any change in your medical condition      (cold, fever, infections).     Do not wear jewelry, make-up, hairpins, clips or nail polish. Do not wear lotions, powders, or perfumes. You may wear deodorant. Do not shave 48 hours prior to surgery. Men may shave face and neck. Do not bring valuables to the hospital.    [facility] is not responsible for any belongings or valuables.  Contacts, dentures or  bridgework may not be worn into surgery. Leave your suitcase in the car. After surgery it may be brought to your room. For patients admitted to the hospital, discharge time is determined by your treatment team.   Patients discharged the day of surgery will not be allowed to drive home.   Please read over the following fact sheets that you were given:    __x__ Take these medicines the morning of surgery with A SIP OF WATER:    1. DULoxetine (CYMBALTA) 30 MG capsule  2. metoprolol tartrate (LOPRESSOR) 100 MG tablet  3.   4.  5.  6.  ____ Fleet Enema (as directed)   __x__ Use CHG Soap as directed  _x___ Use inhalers on the day of surgeryalbuterol (VENTOLIN HFA) 108 (90 Base) MCG/ACT inhaler   __x__ Stop metformin 2 days prior to surgery Last dose tonight   __x__ Take 1/2 of usual insulin dose the night before surgery. No insulin the morning          of surgery.   __x__ Stop aspirin 325 MG tablet today  __x__ Stop Anti-inflammatories ibuprofen or Aleve  May take tylenol   __x__ Stop supplements until after surgery.  Fish oil  ____ Bring C-Pap to the hospital.

## 2018-09-28 NOTE — H&P (View-Only) (Signed)
Patient ID: Craig Koch, male   DOB: Feb 20, 1955, 63 y.o.   MRN: [id number]  Chief Complaint  Patient presents with  . New Patient (Initial Visit)    Pleurx catheter insetion and port a cath    Referred By Dr. Tasia Catchings oncology Reason for Referral left-sided pleural effusion  HPI Location, Quality, Duration, Severity, Timing, Context, Modifying Factors, Associated Signs and Symptoms.  Craig Koch is a 63 y.o. male.  His problems began about a month ago when he began experiencing increasing shortness of breath over his baseline.  He went to the emergency department where a large left-sided pleural effusion was identified and had a chest x-ray and CT scan.  He has had a complete evaluation by our oncology team headed by Dr. 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  He is undergone 3 separate thoracenteses and comes in today to discuss Pleurx catheter insertion.  The patient was also to discuss Port-A-Cath placement but he has declined any chemotherapy stating that because of his advanced stage of disease he would rather forego that for quality of life.  Each time he has had his pleural effusion drained he is felt significantly better.  He did denies any cough.  He denies any chest pain.  He is short of breath now.  He feels that his pleural effusion has recurred.  He smokes about 6 cigarettes/day.   Past Medical History:  Diagnosis Date  . Anxiety   . Arthritis   . Coronary artery disease   . Diabetes mellitus   . Dyslipidemia   . Hx of CABG   . Hyperlipidemia   . Hypertension   . Pleural effusion     Past Surgical History:  Procedure Laterality Date  . CATARACT EXTRACTION Left 09/2015  . CORONARY ARTERY BYPASS GRAFT     (CABG with LIMA to the  LAD, SVG to OM2/OM3, SVG  to diag  . Left ankle surgery     repair of fracture  . Right lower leg surgery     rod    Family History  Problem  Relation Age of Onset  . Dementia Mother   . Heart disease Father   . Colon cancer Neg Hx   . Prostate cancer Neg Hx   . Diabetes Neg Hx     Social History Social History   Tobacco Use  . Smoking status: Current Every Day Smoker    Packs/day: 0.25    Years: 45.00    Pack years: 11.25    Types: Cigarettes  . Smokeless tobacco: Former Systems developer    Types: Snuff  Substance Use Topics  . Alcohol use: Yes    Alcohol/week: 6.0 standard drinks    Types: 6 Cans of beer per week    Comment: occ, average 6 pack in a week  . Drug use: No    Allergies  Allergen Reactions  . Lipitor [Atorvastatin Calcium] Other (See Comments)    Aches.  Tolerated crestor.     Current Outpatient Medications  Medication Sig Dispense Refill  . albuterol (VENTOLIN HFA) 108 (90 Base) MCG/ACT inhaler Inhale 2 puffs into the lungs every 6 (six) hours as needed for wheezing or shortness of breath. 18 g 0  . aspirin 325 MG tablet Take 325 mg by mouth daily.    . BD INSULIN SYRINGE U/F 31G X 5/16" 0.5 ML MISC USE DAILY AS DIRECTED. DX E11.9 100 each 1  .  DULoxetine (CYMBALTA) 30 MG capsule Take 1 capsule (30 mg total) by mouth daily.    [name] Fexofenadine HCl (ALLEGRA ALLERGY PO) Take 1 tablet by mouth daily as needed.    . furosemide (LASIX) 20 MG tablet Take 1 tablet (20 mg total) by mouth daily. 30 tablet 0  . glucose blood (ONE TOUCH ULTRA TEST) test strip USE TO TEST GLUCOSE LEVELS 4 (FOUR) TIMES DAILY. 400 each 3  . ibuprofen (ADVIL,MOTRIN) 200 MG tablet Take 400 mg by mouth every 6 (six) hours as needed.     . insulin detemir (LEVEMIR) 100 UNIT/ML injection Inject 0.3 mLs (30 Units total) into the skin at bedtime. 10 mL 5  . insulin lispro (HUMALOG) 100 UNIT/ML injection Inject 0.15 mLs (15 Units total) into the skin 3 (three) times daily with meals.    [name] lisinopril (ZESTRIL) 2.5 MG tablet TAKE 1 TABLET BY MOUTH DAILY 90 tablet 2  . metFORMIN (GLUCOPHAGE) 500 MG tablet TAKE 1 TABLET (500 MG TOTAL) BY MOUTH 2  (TWO) TIMES DAILY WITH A MEAL. 180 tablet 1  . metoprolol tartrate (LOPRESSOR) 100 MG tablet TAKE 0.5 TABLETS (50 MG TOTAL) BY MOUTH 2 (TWO) TIMES DAILY. 90 tablet 1  . Omega-3 Fatty Acids (FISH OIL) 1200 MG CAPS Take 2 capsules by mouth 3 (three) times daily with meals.    . rosuvastatin (CRESTOR) 10 MG tablet TAKE 1 TABLET (10 MG TOTAL) BY MOUTH DAILY. 90 tablet 3   No current facility-administered medications for this visit.       Review of Systems A complete review of systems was asked and was negative except for the following positive findings shortness of breath, arthritis, joint pain.  Blood pressure (!) 154/91, pulse (!) 111, temperature (!) 97.3 F (36.3 C), temperature source Temporal, resp. rate 18, height 6' (1.829 m), weight 213 lb (96.6 kg), SpO2 96 %.  Physical Exam CONSTITUTIONAL:  Pleasant, well-developed, well-nourished, and in no acute distress. EYES: Pupils equal and reactive to light, Sclera non-icteric EARS, NOSE, MOUTH AND THROAT:  The oropharynx was clear.  Dentition is poor repair upper dentures.  Oral mucosa pink and moist. LYMPH NODES:  Lymph nodes in the neck and axillae were normal RESPIRATORY:  Lungs were distant bilaterally and slightly diminished on the left.  Normal respiratory effort without pathologic use of accessory muscles of respiration CARDIOVASCULAR: Heart was regular without murmurs.  There were no carotid bruits. GI: The abdomen was soft, nontender, and nondistended. There were no palpable masses. There was no hepatosplenomegaly. There were normal bowel sounds in all quadrants. GU:  Rectal deferred.   MUSCULOSKELETAL:  Normal muscle strength and tone.  No clubbing or cyanosis.   SKIN:  There were no pathologic skin lesions.  There were no nodules on palpation. NEUROLOGIC:  Sensation is normal.  Cranial nerves are grossly intact. PSYCH:  Oriented to person, place and time.  Mood and affect are normal.  Data Reviewed Multiple imaging  studies  I have personally reviewed the patient's imaging, laboratory findings and medical records.    Assessment    Left-sided malignant pleural effusion.  His most recent imaging study was a PET scan showing a left hydropneumothorax.  There is dense consolidation in the upper lobe.    Plan    I had a long discussion today with the patient regarding his Port-A-Cath insertion.  He is fairly adamant he does not want any chemotherapy and therefore we will hold off on that.  I did review with him the indications  and risks of Pleurx catheter insertion including risks of bleeding, infection, lung injury and death.  We also discussed the long-term management of the catheter including and possibility of infection and/or dysfunction.  Overall he would like [place] to proceed.      [name], MD 09/28/2018, 8:44 AM

## 2018-09-28 NOTE — Telephone Encounter (Signed)
Pt has been advised of pre admission date/time, Covid Testing date and Surgery date.  Surgery Date: 10/01/18 with Dr Ovidio Hanger of pleurx cath.  Preadmission Testing Date: 09/28/18 10:30am-office interview-patient arrive at medical mall.  Covid Testing Date: 09/28/18 following preadmission appointment - patient advised to go to the [facility] ([place])  [employer] Video sent via [employer] [facility] and [employer].

## 2018-09-28 NOTE — Patient Instructions (Addendum)
Our surgery scheduler will call you within 24-48 hours to schedule your surgery. Please have your Blue sheet available when she calls to discuss the surgery.

## 2018-09-28 NOTE — Telephone Encounter (Signed)
I called [phone] three times and also called her cell phone. She did no answer.  Please see if patient wants to set up virtual visit for further discussion. Thank you.

## 2018-09-30 MED ORDER — CEFAZOLIN SODIUM-DEXTROSE 2-4 GM/100ML-% IV SOLN
2.0000 g | INTRAVENOUS | Status: AC
  Administered 2018-10-01: 2 g via INTRAVENOUS

## 2018-10-01 ENCOUNTER — Encounter
Admission: AD | Disposition: A | Discharge status: Home / Self Care | Payer: Self-pay | Source: Ambulatory Visit | Attending: Cardiothoracic Surgery

## 2018-10-01 ENCOUNTER — Ambulatory Visit: Payer: Medicare Other | Admitting: Certified Registered"

## 2018-10-01 ENCOUNTER — Other Ambulatory Visit: Payer: Self-pay

## 2018-10-01 ENCOUNTER — Inpatient Hospital Stay
Admission: AD | Admit: 2018-10-01 | Discharge: 2018-10-02 | DRG: 181 | Disposition: A | Discharge status: Home / Self Care | Payer: Medicare Other | Source: Ambulatory Visit | Attending: Cardiothoracic Surgery | Admitting: Cardiothoracic Surgery

## 2018-10-01 ENCOUNTER — Encounter: Payer: Self-pay | Admitting: *Deleted

## 2018-10-01 ENCOUNTER — Inpatient Hospital Stay: Payer: Medicare Other

## 2018-10-01 DIAGNOSIS — I2510 Atherosclerotic heart disease of native coronary artery without angina pectoris: Secondary | ICD-10-CM | POA: Diagnosis present

## 2018-10-01 DIAGNOSIS — Z8249 Family history of ischemic heart disease and other diseases of the circulatory system: Secondary | ICD-10-CM

## 2018-10-01 DIAGNOSIS — E119 Type 2 diabetes mellitus without complications: Secondary | ICD-10-CM | POA: Diagnosis present

## 2018-10-01 DIAGNOSIS — J910 Malignant pleural effusion: Secondary | ICD-10-CM | POA: Diagnosis present

## 2018-10-01 DIAGNOSIS — I10 Essential (primary) hypertension: Secondary | ICD-10-CM | POA: Diagnosis not present

## 2018-10-01 DIAGNOSIS — F17210 Nicotine dependence, cigarettes, uncomplicated: Secondary | ICD-10-CM | POA: Diagnosis present

## 2018-10-01 DIAGNOSIS — C3490 Malignant neoplasm of unspecified part of unspecified bronchus or lung: Secondary | ICD-10-CM | POA: Diagnosis present

## 2018-10-01 DIAGNOSIS — J90 Pleural effusion, not elsewhere classified: Secondary | ICD-10-CM | POA: Diagnosis not present

## 2018-10-01 DIAGNOSIS — Z888 Allergy status to other drugs, medicaments and biological substances status: Secondary | ICD-10-CM

## 2018-10-01 DIAGNOSIS — Z09 Encounter for follow-up examination after completed treatment for conditions other than malignant neoplasm: Secondary | ICD-10-CM

## 2018-10-01 DIAGNOSIS — Z794 Long term (current) use of insulin: Secondary | ICD-10-CM | POA: Diagnosis not present

## 2018-10-01 DIAGNOSIS — Z7982 Long term (current) use of aspirin: Secondary | ICD-10-CM

## 2018-10-01 DIAGNOSIS — Z951 Presence of aortocoronary bypass graft: Secondary | ICD-10-CM

## 2018-10-01 DIAGNOSIS — E785 Hyperlipidemia, unspecified: Secondary | ICD-10-CM | POA: Diagnosis present

## 2018-10-01 DIAGNOSIS — Z20828 Contact with and (suspected) exposure to other viral communicable diseases: Secondary | ICD-10-CM | POA: Diagnosis present

## 2018-10-01 HISTORY — PX: CHEST TUBE INSERTION: SHX231

## 2018-10-01 LAB — GLUCOSE, CAPILLARY
Glucose-Capillary: 250 mg/dL — ABNORMAL HIGH (ref 70–99)
Glucose-Capillary: 248 mg/dL — ABNORMAL HIGH (ref 70–99)
Glucose-Capillary: 237 mg/dL — ABNORMAL HIGH (ref 70–99)
Glucose-Capillary: 216 mg/dL — ABNORMAL HIGH (ref 70–99)
Glucose-Capillary: 179 mg/dL — ABNORMAL HIGH (ref 70–99)
Glucose-Capillary: 189 mg/dL — ABNORMAL HIGH (ref 70–99)

## 2018-10-01 SURGERY — INSERTION, PLEURAL DRAINAGE CATHETER
Anesthesia: General | Laterality: Left

## 2018-10-01 MED ORDER — ASPIRIN 325 MG PO TABS
325.0000 mg | ORAL_TABLET | Freq: Every day | ORAL | Status: DC
  Administered 2018-10-01 – 2018-10-02 (×2): 325 mg via ORAL
  Filled 2018-10-01 (×2): qty 1

## 2018-10-01 MED ORDER — FAMOTIDINE 20 MG PO TABS
20.0000 mg | ORAL_TABLET | Freq: Once | ORAL | Status: AC
  Administered 2018-10-01: 07:00:00 20 mg via ORAL

## 2018-10-01 MED ORDER — CHLORHEXIDINE GLUCONATE CLOTH 2 % EX PADS: 6.0000 | MEDICATED_PAD | Freq: Once | CUTANEOUS | Status: DC

## 2018-10-01 MED ORDER — INSULIN ASPART 100 UNIT/ML ~~LOC~~ SOLN
4.0000 [IU] | Freq: Three times a day (TID) | SUBCUTANEOUS | Status: DC
  Administered 2018-10-01 – 2018-10-02 (×4): 4 [IU] via SUBCUTANEOUS
  Filled 2018-10-01 (×4): qty 1

## 2018-10-01 MED ORDER — FAMOTIDINE 20 MG PO TABS
ORAL_TABLET | ORAL | Status: AC
  Administered 2018-10-01: 07:00:00 20 mg via ORAL
  Filled 2018-10-01: qty 1

## 2018-10-01 MED ORDER — LIDOCAINE HCL (PF) 1 % IJ SOLN
INTRAMUSCULAR | Status: AC
  Filled 2018-10-01: qty 30

## 2018-10-01 MED ORDER — OXYCODONE HCL 5 MG PO TABS
5.0000 mg | ORAL_TABLET | ORAL | Status: DC | PRN
  Administered 2018-10-01: 11:00:00 5 mg via ORAL
  Administered 2018-10-02 (×2): 10 mg via ORAL
  Filled 2018-10-01: qty 2
  Filled 2018-10-01: qty 1
  Filled 2018-10-01: qty 2

## 2018-10-01 MED ORDER — LISINOPRIL 2.5 MG PO TABS
2.5000 mg | ORAL_TABLET | Freq: Every day | ORAL | Status: DC
  Administered 2018-10-01 – 2018-10-02 (×2): 2.5 mg via ORAL
  Filled 2018-10-01 (×2): qty 1

## 2018-10-01 MED ORDER — PROPOFOL 500 MG/50ML IV EMUL
INTRAVENOUS | Status: DC | PRN
  Administered 2018-10-01: 100 ug/kg/min via INTRAVENOUS

## 2018-10-01 MED ORDER — DEXTROSE-NACL 5-0.45 % IV SOLN
INTRAVENOUS | Status: DC
  Administered 2018-10-01 – 2018-10-02 (×2): via INTRAVENOUS

## 2018-10-01 MED ORDER — METFORMIN HCL 500 MG PO TABS
500.0000 mg | ORAL_TABLET | Freq: Two times a day (BID) | ORAL | Status: DC
  Administered 2018-10-01 – 2018-10-02 (×2): 500 mg via ORAL
  Filled 2018-10-01 (×2): qty 1

## 2018-10-01 MED ORDER — FENTANYL CITRATE (PF) 100 MCG/2ML IJ SOLN: 25.0000 ug | INTRAMUSCULAR | Status: DC | PRN

## 2018-10-01 MED ORDER — ONDANSETRON HCL 4 MG/2ML IJ SOLN: 4.0000 mg | Freq: Once | INTRAMUSCULAR | Status: DC | PRN

## 2018-10-01 MED ORDER — METOPROLOL TARTRATE 50 MG PO TABS
50.0000 mg | ORAL_TABLET | Freq: Two times a day (BID) | ORAL | Status: DC
  Administered 2018-10-01 – 2018-10-02 (×2): 50 mg via ORAL
  Filled 2018-10-01 (×2): qty 1

## 2018-10-01 MED ORDER — BISACODYL 5 MG PO TBEC
10.0000 mg | DELAYED_RELEASE_TABLET | Freq: Every day | ORAL | Status: DC
  Administered 2018-10-01 – 2018-10-02 (×2): 10 mg via ORAL
  Filled 2018-10-01 (×2): qty 2

## 2018-10-01 MED ORDER — ONDANSETRON HCL 4 MG/2ML IJ SOLN
INTRAMUSCULAR | Status: DC | PRN
  Administered 2018-10-01: 4 mg via INTRAVENOUS

## 2018-10-01 MED ORDER — SODIUM CHLORIDE 0.9 % IV SOLN
INTRAVENOUS | Status: DC
  Administered 2018-10-01 (×2): via INTRAVENOUS

## 2018-10-01 MED ORDER — FENTANYL CITRATE (PF) 100 MCG/2ML IJ SOLN
INTRAMUSCULAR | Status: DC | PRN
  Administered 2018-10-01: 25 ug via INTRAVENOUS

## 2018-10-01 MED ORDER — PROPOFOL 500 MG/50ML IV EMUL
INTRAVENOUS | Status: AC
  Filled 2018-10-01: qty 100

## 2018-10-01 MED ORDER — ALBUTEROL SULFATE HFA 108 (90 BASE) MCG/ACT IN AERS: 2.0000 | INHALATION_SPRAY | Freq: Four times a day (QID) | RESPIRATORY_TRACT | Status: DC | PRN

## 2018-10-01 MED ORDER — CEFAZOLIN SODIUM-DEXTROSE 2-4 GM/100ML-% IV SOLN
2.0000 g | Freq: Three times a day (TID) | INTRAVENOUS | Status: AC
  Administered 2018-10-01 – 2018-10-02 (×2): 2 g via INTRAVENOUS
  Filled 2018-10-01 (×2): qty 100

## 2018-10-01 MED ORDER — MORPHINE SULFATE (PF) 2 MG/ML IV SOLN: 1.0000 mg | INTRAVENOUS | Status: DC | PRN

## 2018-10-01 MED ORDER — FENTANYL CITRATE (PF) 100 MCG/2ML IJ SOLN
INTRAMUSCULAR | Status: AC
  Filled 2018-10-01: qty 2

## 2018-10-01 MED ORDER — INSULIN ASPART 100 UNIT/ML ~~LOC~~ SOLN: 0.0000 [IU] | Freq: Every day | SUBCUTANEOUS | Status: DC

## 2018-10-01 MED ORDER — MIDAZOLAM HCL 2 MG/2ML IJ SOLN
INTRAMUSCULAR | Status: DC | PRN
  Administered 2018-10-01: 2 mg via INTRAVENOUS

## 2018-10-01 MED ORDER — PROPOFOL 10 MG/ML IV BOLUS
INTRAVENOUS | Status: DC | PRN
  Administered 2018-10-01 (×2): 20 mg via INTRAVENOUS

## 2018-10-01 MED ORDER — ALBUTEROL SULFATE (2.5 MG/3ML) 0.083% IN NEBU
2.5000 mg | INHALATION_SOLUTION | RESPIRATORY_TRACT | Status: DC
  Administered 2018-10-01 – 2018-10-02 (×6): 2.5 mg via RESPIRATORY_TRACT
  Filled 2018-10-01 (×7): qty 3

## 2018-10-01 MED ORDER — ONDANSETRON HCL 4 MG/2ML IJ SOLN: 4.0000 mg | Freq: Four times a day (QID) | INTRAMUSCULAR | Status: DC | PRN

## 2018-10-01 MED ORDER — DULOXETINE HCL 30 MG PO CPEP
30.0000 mg | ORAL_CAPSULE | Freq: Every day | ORAL | Status: DC
  Administered 2018-10-02: 09:00:00 30 mg via ORAL
  Filled 2018-10-01: qty 1

## 2018-10-01 MED ORDER — PHENYLEPHRINE HCL (PRESSORS) 10 MG/ML IV SOLN
INTRAVENOUS | Status: DC | PRN
  Administered 2018-10-01 (×2): 100 ug via INTRAVENOUS

## 2018-10-01 MED ORDER — INSULIN ASPART 100 UNIT/ML ~~LOC~~ SOLN
0.0000 [IU] | Freq: Three times a day (TID) | SUBCUTANEOUS | Status: DC
  Administered 2018-10-01: 5 [IU] via SUBCUTANEOUS
  Administered 2018-10-01: 17:00:00 3 [IU] via SUBCUTANEOUS
  Administered 2018-10-02 (×2): 5 [IU] via SUBCUTANEOUS
  Filled 2018-10-01 (×4): qty 1

## 2018-10-01 MED ORDER — CEFAZOLIN SODIUM-DEXTROSE 2-4 GM/100ML-% IV SOLN
INTRAVENOUS | Status: AC
  Filled 2018-10-01: qty 100

## 2018-10-01 MED ORDER — TRAMADOL HCL 50 MG PO TABS
50.0000 mg | ORAL_TABLET | Freq: Four times a day (QID) | ORAL | Status: DC
  Administered 2018-10-01: 13:00:00 50 mg via ORAL
  Administered 2018-10-01: 17:00:00 100 mg via ORAL
  Administered 2018-10-02: 06:00:00 50 mg via ORAL
  Administered 2018-10-02: 13:00:00 100 mg via ORAL
  Administered 2018-10-02: 50 mg via ORAL
  Filled 2018-10-01: qty 2
  Filled 2018-10-01 (×3): qty 1
  Filled 2018-10-01: qty 2

## 2018-10-01 MED ORDER — EPHEDRINE SULFATE 50 MG/ML IJ SOLN
INTRAMUSCULAR | Status: DC | PRN
  Administered 2018-10-01 (×2): 10 mg via INTRAVENOUS

## 2018-10-01 MED ORDER — MIDAZOLAM HCL 2 MG/2ML IJ SOLN
INTRAMUSCULAR | Status: AC
  Filled 2018-10-01: qty 2

## 2018-10-01 MED ORDER — LIDOCAINE HCL 1 % IJ SOLN
INTRAMUSCULAR | Status: DC | PRN
  Administered 2018-10-01: 10 mL via INTRADERMAL

## 2018-10-01 SURGICAL SUPPLY — 34 items
APL PRP STRL LF DISP 70% ISPRP (MISCELLANEOUS) ×1
BLADE SURG 15 STRL LF DISP TIS (BLADE) ×1 IMPLANT
BLADE SURG 15 STRL SS (BLADE) ×2
CANISTER SUCT 1200ML W/VALVE (MISCELLANEOUS) ×2 IMPLANT
CHLORAPREP W/TINT 26 (MISCELLANEOUS) ×2 IMPLANT
DRAIN CHEST DRY SUCT SGL (MISCELLANEOUS) ×2 IMPLANT
DRAPE INCISE IOBAN 66X45 STRL (DRAPES) ×2 IMPLANT
DRAPE LAPAROTOMY 77X122 PED (DRAPES) ×2 IMPLANT
ELECT REM PT RETURN 9FT ADLT (ELECTROSURGICAL) ×2
ELECTRODE REM PT RTRN 9FT ADLT (ELECTROSURGICAL) ×1 IMPLANT
GLOVE SURG SYN 7.5  E (GLOVE) ×1
GLOVE SURG SYN 7.5 E (GLOVE) ×1 IMPLANT
GOWN STRL REUS W/ TWL LRG LVL3 (GOWN DISPOSABLE) ×2 IMPLANT
GOWN STRL REUS W/TWL LRG LVL3 (GOWN DISPOSABLE) ×4
KIT PLEURX DRAIN CATH 500ML (KITS) ×2 IMPLANT
KIT TURNOVER KIT A (KITS) ×2 IMPLANT
LABEL OR SOLS (LABEL) IMPLANT
MARKER SKIN DUAL TIP RULER LAB (MISCELLANEOUS) ×2 IMPLANT
PACK BASIN MINOR ARMC (MISCELLANEOUS) ×2 IMPLANT
SUCTION FRAZIER HANDLE 10FR (MISCELLANEOUS) ×1
SUCTION TUBE FRAZIER 10FR DISP (MISCELLANEOUS) ×1 IMPLANT
SUT ETH BLK MONO 3 0 FS 1 12/B (SUTURE) ×4 IMPLANT
SUT ETHILON 4-0 (SUTURE) ×2
SUT ETHILON 4-0 FS2 18XMFL BLK (SUTURE) ×1
SUT SILK 0 (SUTURE) ×2
SUT SILK 0 30XBRD TIE 6 (SUTURE) ×1 IMPLANT
SUT SILK 1 SH (SUTURE) ×2 IMPLANT
SUT VIC AB 0 SH 27 (SUTURE) ×2 IMPLANT
SUT VIC AB 2-0 SH 27 (SUTURE) ×2
SUT VIC AB 2-0 SH 27XBRD (SUTURE) ×1 IMPLANT
SUT VIC AB 3-0 SH 27 (SUTURE) ×2
SUT VIC AB 3-0 SH 27X BRD (SUTURE) ×1 IMPLANT
SUTURE ETHLN 4-0 FS2 18XMF BLK (SUTURE) ×1 IMPLANT
WATER STERILE IRR 1000ML POUR (IV SOLUTION) ×2 IMPLANT

## 2018-10-01 NOTE — Telephone Encounter (Signed)
Called patient's wife and discussed.  All questions answered

## 2018-10-01 NOTE — Progress Notes (Signed)
Dr. Genevive Bi has seen the patient and the chest tube cannister has been changed. Pleurx drain has been clamped for now again by Dr. Genevive Bi and will need to be unclamped at 4pm per Dr. Genevive Bi. Drain to be left on Wall suction to -20 as ordered. Chest x-ray to be performed tomorrow morning. Patient may possibly be D/C tomorrow with plurex drain. Education needs to be done with wife and patient tomorrow by Therapist, sports. Current RN has informed Dr. Genevive Bi that this would be passed to the next shift nurse for the patient to be educated with wife. Case management has been informed about supplies need to be order for D/C. Possibly a Box of 4 cannister will be needed until home health follows up with the patient and provided additional supplies.

## 2018-10-01 NOTE — Op Note (Signed)
  10/01/2018  8:46 AM   PATIENT:  Craig Koch  63 y.o. male  PRE-OPERATIVE DIAGNOSIS: Malignant pleural effusion left side  POST-OPERATIVE DIAGNOSIS: Malignant pleural effusion left side  PROCEDURE: Insertion of left-sided Pleurx catheter  SURGEON:  Surgeon(s) and Role:    [name], MD - Primary  ASSISTANTS: None  ANESTHESIA: Intravenous sedation  INDICATIONS FOR PROCEDURE malignant pleural effusion left side with evidence of recurrence  DICTATION: This patient is a 63 year old gentleman who was recently diagnosed with a malignant pleural effusion on the left.  An extensive evaluation revealed a large left-sided pleural effusion which has been tapped on multiple occasions.  The patient has declined chemotherapy and the Pleurx catheter is being inserted for symptom management.  The indications and risks were explained to the patient gave informed consent.  The patient was brought to the operating suite and placed in the right lateral decubitus position.  An appropriate site was selected on the left chest wall and 2 skin marks were made.  The patient was then prepped and draped in usual sterile fashion.  Using intravenous sedation and 1% lidocaine as a local anesthetic we created an skin entrance site as well as a chest entrance site.  Using the long hollow needle the pleural space was aspirated and a wire was introduced and finally the catheter was inserted through a peel-away sheath.  Once the catheter is in appropriate position it was secured to the skin with a heavy silk suture.  We then drained approximately 2 L of serosanguineous fluid from the left chest.  I stopped at that point.  I used a single 2-0 Vicryl on the deeper tissues and interrupted nylon sutures on the skin.  Sterile dressings were applied.  Patient tolerated procedure well was transported back to the recovery room in stable condition.  All sponge needle and instrument counts were correct as reported to me at the  end of the case.   [name], MD

## 2018-10-01 NOTE — Anesthesia Postprocedure Evaluation (Signed)
Anesthesia Post Note  Patient: Larance Ratledge Simerly  Procedure(s) Performed: INSERTION PLEURAL DRAINAGE CATHETER (Left )  Patient location during evaluation: PACU Anesthesia Type: General Level of consciousness: awake and alert and oriented Pain management: pain level controlled Vital Signs Assessment: post-procedure vital signs reviewed and stable Respiratory status: spontaneous breathing, nonlabored ventilation and respiratory function stable Cardiovascular status: blood pressure returned to baseline and stable Postop Assessment: no signs of nausea or vomiting Anesthetic complications: no     Last Vitals:  Vitals:   10/01/18 0940 10/01/18 1027  BP: 114/64 114/64  Pulse: 63 61  Resp: (!) 24 20  Temp: (!) 36.1 C (!) 36.4 C  SpO2: 96% 97%    Last Pain:  Vitals:   10/01/18 1027  TempSrc: Oral  PainSc:                  Sidi Dzikowski

## 2018-10-01 NOTE — Anesthesia Preprocedure Evaluation (Addendum)
Anesthesia Evaluation  Patient identified by MRN, date of birth, ID band Patient awake    Reviewed: Allergy & Precautions, NPO status , Patient's Chart, lab work & pertinent test results  History of Anesthesia Complications Negative for: history of anesthetic complications  Airway Mallampati: II  TM Distance: >3 FB Neck ROM: Full    Dental  (+) Poor Dentition, Missing   Pulmonary neg sleep apnea, Current Smoker and Patient abstained from smoking.,  Pleural effusion    breath sounds clear to auscultation- rhonchi (-) wheezing      Cardiovascular hypertension, + CAD and + CABG (2004)  (-) Cardiac Stents  Rhythm:Regular Rate:Normal - Systolic murmurs and - Diastolic murmurs    Neuro/Psych neg Seizures Anxiety negative neurological ROS     GI/Hepatic negative GI ROS, Neg liver ROS,   Endo/Other  diabetes, Insulin Dependent  Renal/GU negative Renal ROS     Musculoskeletal  (+) Arthritis ,   Abdominal (+) - obese,   Peds  Hematology negative hematology ROS (+)   Anesthesia Other Findings Past Medical History: No date: Anxiety No date: Arthritis No date: Cancer ([place])     Comment:  lung No date: Coronary artery disease No date: Diabetes mellitus No date: Dyslipidemia No date: Hx of CABG No date: Hyperlipidemia No date: Hypertension No date: Pleural effusion   Reproductive/Obstetrics                            Anesthesia Physical Anesthesia Plan  ASA: III  Anesthesia Plan: General   Post-op Pain Management:    Induction: Intravenous  PONV Risk Score and Plan: 0 and Propofol infusion  Airway Management Planned: Natural Airway  Additional Equipment:   Intra-op Plan:   Post-operative Plan:   Informed Consent: I have reviewed the patients History and Physical, chart, labs and discussed the procedure including the risks, benefits and alternatives for the proposed anesthesia  with the patient or authorized representative who has indicated his/her understanding and acceptance.     Dental advisory given  Plan Discussed with: CRNA and Anesthesiologist  Anesthesia Plan Comments:         Anesthesia Quick Evaluation

## 2018-10-01 NOTE — Anesthesia Post-op Follow-up Note (Signed)
Anesthesia QCDR form completed.        

## 2018-10-01 NOTE — Transfer of Care (Signed)
Immediate Anesthesia Transfer of Care Note  Patient: Craig Koch  Procedure(s) Performed: INSERTION PLEURAL DRAINAGE CATHETER (Left )  Patient Location: PACU  Anesthesia Type:General  Level of Consciousness: awake, alert , oriented and patient cooperative  Airway & Oxygen Therapy: Patient Spontanous Breathing and Patient connected to face mask oxygen  Post-op Assessment: Report given to RN and Post -op Vital signs reviewed and stable  Post vital signs: Reviewed and stable  Last Vitals:  Vitals Value Taken Time  BP 112/69 10/01/18 0836  Temp    Pulse 72 10/01/18 0839  Resp 15 10/01/18 0839  SpO2 99 % 10/01/18 0839  Vitals shown include unvalidated device data.  Last Pain:  Vitals:   10/01/18 0631  TempSrc: Oral  PainSc: 0-No pain         Complications: No apparent anesthesia complications

## 2018-10-01 NOTE — Telephone Encounter (Signed)
Called wife, Chong Sicilian, and patient is currently inpatient.  She would appreciate Dr. Tasia Catchings to call her and she has her list of questions for MD.

## 2018-10-01 NOTE — Interval H&P Note (Signed)
History and Physical Interval Note:  10/01/2018 7:19 AM  Craig Koch  has presented today for surgery, with the diagnosis of Left Pleural Effusion.  The various methods of treatment have been discussed with the patient and family. After consideration of risks, benefits and other options for treatment, the patient has consented to  Procedure(s): INSERTION PLEURAL DRAINAGE CATHETER (Left) as a surgical intervention.  The patient's history has been reviewed, patient examined, no change in status, stable for surgery.  I have reviewed the patient's chart and labs.  Questions were answered to the patient's satisfaction.     [name]

## 2018-10-01 NOTE — Progress Notes (Signed)
MD notified: Dr.Oaks will you been coming around to see this patient. He has just arrived to room 214. His chest tube is clamp currently and I was told you would be coimg around to unclamp. He has and order to be to wall suction -20 but I wanted to see if you want see the patient.

## 2018-10-02 ENCOUNTER — Inpatient Hospital Stay: Payer: Medicare Other

## 2018-10-02 ENCOUNTER — Encounter: Payer: Self-pay | Admitting: Cardiothoracic Surgery

## 2018-10-02 LAB — GLUCOSE, CAPILLARY
Glucose-Capillary: 240 mg/dL — ABNORMAL HIGH (ref 70–99)
Glucose-Capillary: 209 mg/dL — ABNORMAL HIGH (ref 70–99)

## 2018-10-02 MED ORDER — OXYCODONE-ACETAMINOPHEN 5-325 MG PO TABS: 1.0000 | ORAL_TABLET | ORAL | 0 refills | Status: DC | PRN

## 2018-10-02 NOTE — Progress Notes (Signed)
Craig Koch to be D/C'd home per MD order.  Discussed prescriptions and follow up appointments with the patient. Prescriptions given to patient, medication list explained in detail. Pt verbalized understanding.  Allergies as of 10/02/2018      Reactions   Lipitor [atorvastatin Calcium] Other (See Comments)   Aches.  Tolerated crestor.       Medication List    TAKE these medications   albuterol 108 (90 Base) MCG/ACT inhaler Commonly known as: VENTOLIN HFA Inhale 2 puffs into the lungs every 6 (six) hours as needed for wheezing or shortness of breath.   ALLEGRA ALLERGY PO Take 1 tablet by mouth daily as needed.   aspirin 325 MG tablet Take 325 mg by mouth daily.   BD Insulin Syringe U/F 31G X 5/16" 0.5 ML Misc Generic drug: Insulin Syringe-Needle U-100 USE DAILY AS DIRECTED. DX E11.9   DULoxetine 30 MG capsule Commonly known as: CYMBALTA Take 1 capsule (30 mg total) by mouth daily.   Fish Oil 1200 MG Caps Take 2 capsules by mouth 3 (three) times daily with meals.   furosemide 20 MG tablet Commonly known as: Lasix Take 1 tablet (20 mg total) by mouth daily.   glucose blood test strip Commonly known as: ONE TOUCH ULTRA TEST USE TO TEST GLUCOSE LEVELS 4 (FOUR) TIMES DAILY.   ibuprofen 200 MG tablet Commonly known as: ADVIL Take 400 mg by mouth every 6 (six) hours as needed.   insulin detemir 100 UNIT/ML injection Commonly known as: LEVEMIR Inject 0.3 mLs (30 Units total) into the skin at bedtime.   insulin lispro 100 UNIT/ML injection Commonly known as: HumaLOG Inject 0.15 mLs (15 Units total) into the skin 3 (three) times daily with meals.   lisinopril 2.5 MG tablet Commonly known as: ZESTRIL TAKE 1 TABLET BY MOUTH DAILY   metFORMIN 500 MG tablet Commonly known as: GLUCOPHAGE TAKE 1 TABLET (500 MG TOTAL) BY MOUTH 2 (TWO) TIMES DAILY WITH Craig MEAL.   metoprolol tartrate 100 MG tablet Commonly known as: LOPRESSOR TAKE 0.5 TABLETS (50 MG TOTAL) BY MOUTH 2 (TWO)  TIMES DAILY.   oxyCODONE-acetaminophen 5-325 MG tablet Commonly known as: Percocet Take 1 tablet by mouth every 4 (four) hours as needed for severe pain.   rosuvastatin 10 MG tablet Commonly known as: CRESTOR TAKE 1 TABLET (10 MG TOTAL) BY MOUTH DAILY.       Vitals:   10/02/18 0720 10/02/18 1114  BP:  114/76  Pulse:  72  Resp:  19  Temp:  98.7 F (37.1 C)  SpO2: 92% 92%    Skin clean, dry and intact without evidence of skin break down, no evidence of skin tears noted. IV catheter discontinued intact. Site without signs and symptoms of complications. Dressing and pressure applied. Pt denies pain at this time. No complaints noted.  Patient discharging with PleurX catheter. Education was provided by Dr Genevive Bi at bedside and spouse demonstrated proper teach back. Patient was provided with 6 pleurX kits and the order for all supplies is pending. Patient has contact info to company to ensure delivery soon.   An After Visit Summary was printed and given to the patient. Patient escorted via [place], and D/C home via private auto.  Craig Koch Craig Koch

## 2018-10-02 NOTE — Discharge Summary (Signed)
Physician Discharge Summary  Patient ID: Craig Koch MRN: [id number] DOB/AGE: [birth year] 63 y.o.  Admit date: 10/01/2018 Discharge date: 10/02/2018   Discharge Diagnoses:  Active Problems:   Malignant pleural effusion   Procedures:Insertion of Left sided PleurX catheter [facility] Course: Transferred to floor after catheter insertion.  Drained several liters over the first 24 hours and then drainage slowed.  Fluid initially serosanguinous and later had a slightly more reddish tinge.  CXRay on day of discharge shows near complete evacuation of Pleural fluid and a small hydropneumothorax which was present preoperatively.  Wounds clean and dry.  Wife instructed on how to manage the catheter.  Will see in 48 hours in the office.    Disposition: Discharge disposition: 01-Home or Self Care       Discharge Instructions    Diet - low sodium heart healthy   Complete by: As directed    Increase activity slowly   Complete by: As directed      Allergies as of 10/02/2018      Reactions   Lipitor [atorvastatin Calcium] Other (See Comments)   Aches.  Tolerated crestor.       Medication List    TAKE these medications   albuterol 108 (90 Base) MCG/ACT inhaler Commonly known as: VENTOLIN HFA Inhale 2 puffs into the lungs every 6 (six) hours as needed for wheezing or shortness of breath.   ALLEGRA ALLERGY PO Take 1 tablet by mouth daily as needed.   aspirin 325 MG tablet Take 325 mg by mouth daily.   BD Insulin Syringe U/F 31G X 5/16" 0.5 ML Misc Generic drug: Insulin Syringe-Needle U-100 USE DAILY AS DIRECTED. DX E11.9   DULoxetine 30 MG capsule Commonly known as: CYMBALTA Take 1 capsule (30 mg total) by mouth daily.   Fish Oil 1200 MG Caps Take 2 capsules by mouth 3 (three) times daily with meals.   furosemide 20 MG tablet Commonly known as: Lasix Take 1 tablet (20 mg total) by mouth daily.   glucose blood test strip Commonly known as: ONE TOUCH ULTRA TEST USE TO  TEST GLUCOSE LEVELS 4 (FOUR) TIMES DAILY.   ibuprofen 200 MG tablet Commonly known as: ADVIL Take 400 mg by mouth every 6 (six) hours as needed.   insulin detemir 100 UNIT/ML injection Commonly known as: LEVEMIR Inject 0.3 mLs (30 Units total) into the skin at bedtime.   insulin lispro 100 UNIT/ML injection Commonly known as: HumaLOG Inject 0.15 mLs (15 Units total) into the skin 3 (three) times daily with meals.   lisinopril 2.5 MG tablet Commonly known as: ZESTRIL TAKE 1 TABLET BY MOUTH DAILY   metFORMIN 500 MG tablet Commonly known as: GLUCOPHAGE TAKE 1 TABLET (500 MG TOTAL) BY MOUTH 2 (TWO) TIMES DAILY WITH A MEAL.   metoprolol tartrate 100 MG tablet Commonly known as: LOPRESSOR TAKE 0.5 TABLETS (50 MG TOTAL) BY MOUTH 2 (TWO) TIMES DAILY.   oxyCODONE-acetaminophen 5-325 MG tablet Commonly known as: Percocet Take 1 tablet by mouth every 4 (four) hours as needed for severe pain.   rosuvastatin 10 MG tablet Commonly known as: CRESTOR TAKE 1 TABLET (10 MG TOTAL) BY MOUTH DAILY.        [name], MD

## 2018-10-02 NOTE — Progress Notes (Signed)
  Patient ID: Craig Koch, male   DOB: 06-29-1955, 63 y.o.   MRN: [id number]  HISTORY: He states that he feels much better overall.  He is not short of breath.   Vitals:   10/02/18 0523 10/02/18 0720  BP: 102/62   Pulse: 64   Resp: 18   Temp: 97.6 F (36.4 C)   SpO2: 93% 92%     EXAM:    Resp: Lungs are clear on the right but diminished at the left base.  No respiratory distress, normal effort. Heart:  Regular without murmurs Abd:  Abdomen is soft, non distended and non tender. No masses are palpable.  There is no rebound and no guarding.  Neurological: Alert and oriented to person, place, and time. Coordination normal.  Skin: Skin is warm and dry. No rash noted. No diaphoretic. No erythema. No pallor.  Psychiatric: Normal mood and affect. Normal behavior. Judgment and thought content normal.    ASSESSMENT: There is no air leak from the chest tube.  There is some bloody fluid in the tube.  Independent review of his chest x-ray shows a basilar hydropneumothorax.   PLAN:   We will instruct his wife on how to manage the Pleurx catheter.  He will have a drain daily.  I will see him back in the office in 1 week.    [name], MDPatient ID: Craig Koch, male   DOB: 06-22-1955, 63 y.o.   MRN: [id number]

## 2018-10-03 DIAGNOSIS — C3490 Malignant neoplasm of unspecified part of unspecified bronchus or lung: Secondary | ICD-10-CM | POA: Diagnosis not present

## 2018-10-03 DIAGNOSIS — J910 Malignant pleural effusion: Secondary | ICD-10-CM | POA: Diagnosis not present

## 2018-10-03 NOTE — Care Management (Signed)
Late Entry:   Patient discharged home with pleurx in place.  Initial plan was for patient to be discharged with home health services.  RNCM spoke with [facility], [facility], [facility], [facility], [facility], [facility], and [facility].  All are unable to accept referral for Pleurx cath.  Bedside RN provided patient with 6 kits.  MD provided education to wife and patient at bedside.  Patient was provided with educational packet.  RNCM faxed completed order for the [facility] for supplies.  RNCM called [facility] to confirm they received orders.   Rep states that it takes 5 hours for forms to be uploaded in to their system.  Order would be processed the next day, and supplies would be shipped to the home within 48 hours from that.  Wife was provided this information, and provided contact information should they have any issues

## 2018-10-04 ENCOUNTER — Encounter: Payer: Self-pay | Admitting: Cardiothoracic Surgery

## 2018-10-04 ENCOUNTER — Other Ambulatory Visit: Payer: Self-pay

## 2018-10-04 ENCOUNTER — Ambulatory Visit
Admission: RE | Admit: 2018-10-04 | Discharge: 2018-10-04 | Disposition: A | Discharge status: Home / Self Care | Payer: Medicare Other | Source: Ambulatory Visit | Attending: Cardiothoracic Surgery | Admitting: Cardiothoracic Surgery

## 2018-10-04 ENCOUNTER — Ambulatory Visit (INDEPENDENT_AMBULATORY_CARE_PROVIDER_SITE_OTHER): Payer: Medicare Other | Admitting: Cardiothoracic Surgery

## 2018-10-04 VITALS — BP 118/77 | HR 73 | Temp 97.5°F | Ht 72.0 in | Wt 207.4 lb

## 2018-10-04 DIAGNOSIS — J90 Pleural effusion, not elsewhere classified: Secondary | ICD-10-CM

## 2018-10-04 DIAGNOSIS — Z4682 Encounter for fitting and adjustment of non-vascular catheter: Secondary | ICD-10-CM | POA: Diagnosis not present

## 2018-10-04 DIAGNOSIS — C3492 Malignant neoplasm of unspecified part of left bronchus or lung: Secondary | ICD-10-CM | POA: Diagnosis not present

## 2018-10-04 DIAGNOSIS — J939 Pneumothorax, unspecified: Secondary | ICD-10-CM | POA: Diagnosis not present

## 2018-10-04 NOTE — Patient Instructions (Addendum)
Please continue to drain the catheter daily. Please have the chest xray prior to seeing Dr.Oaks

## 2018-10-04 NOTE — Progress Notes (Signed)
  Patient ID: Craig Koch, male   DOB: 12-28-1955, 63 y.o.   MRN: [id number]  HISTORY: He returns today in follow-up.  He did to have his Pleurx catheter placed 2 days ago.  They have not drained it but they have noticed some saturation of the dressings.  He had a chest x-ray made today which shows some subcutaneous emphysema and a small left pleural effusion.   Vitals:   10/04/18 1056  BP: 118/77  Pulse: 73  Temp: (!) 97.5 F (36.4 C)  SpO2: 96%     EXAM:  The catheter does have a small amount of serosanguineous drainage in it.  I remove the dressing.  There was some serosanguineous discharge on the dressings itself.  The catheter looks fine as does the skin entrance site and the chest tube site.  We then drained the catheter of 1.2 L of serosanguineous fluid.  I showed the family that placing a small aliquot of fluid onto a gauze pad demonstrated that the hematocrit was quite low in the fluid although it does appear more red than usual.   ASSESSMENT: I would like him to continue his dressing changes daily and also drain the catheter daily.   PLAN:    I will see him back again next week for his sutures to be removed.    [name], MD

## 2018-10-09 ENCOUNTER — Other Ambulatory Visit: Payer: Self-pay

## 2018-10-09 DIAGNOSIS — C3490 Malignant neoplasm of unspecified part of unspecified bronchus or lung: Secondary | ICD-10-CM

## 2018-10-10 ENCOUNTER — Telehealth: Payer: Self-pay | Admitting: Cardiothoracic Surgery

## 2018-10-10 NOTE — Telephone Encounter (Signed)
Patient said at his wound site under the bandage has some drainage that is clear and just asking if this is okay, patient is due to come in the morning at 8am for a wound check. Please call patient and advise.

## 2018-10-10 NOTE — Telephone Encounter (Signed)
Spoke with patient and he states he has some clear drainage. Denies fever, chills, nausea at this time.   He was instructed to keep appointment 10/11/2018 for evaluation of drainage.

## 2018-10-11 ENCOUNTER — Ambulatory Visit: Payer: Medicare Other

## 2018-10-11 ENCOUNTER — Other Ambulatory Visit: Payer: Self-pay

## 2018-10-11 VITALS — BP 120/65 | HR 75 | Temp 97.7°F | Ht 72.0 in | Wt 210.2 lb

## 2018-10-11 DIAGNOSIS — J90 Pleural effusion, not elsewhere classified: Secondary | ICD-10-CM

## 2018-10-11 NOTE — Progress Notes (Signed)
Suture removed today without difficulty. Strei strip placed. No redness. Noticed minimal drainage from catheter around drain site. No fever, no chills.

## 2018-10-11 NOTE — Patient Instructions (Signed)
Please see your appointment listed below. Please continue to drain the catheter daily.

## 2018-10-12 ENCOUNTER — Telehealth: Payer: Self-pay

## 2018-10-12 ENCOUNTER — Inpatient Hospital Stay: Payer: Medicare Other

## 2018-10-12 ENCOUNTER — Encounter: Payer: Self-pay | Admitting: Oncology

## 2018-10-12 ENCOUNTER — Inpatient Hospital Stay: Payer: Medicare Other | Attending: Oncology | Admitting: Oncology

## 2018-10-12 ENCOUNTER — Other Ambulatory Visit: Payer: Self-pay

## 2018-10-12 VITALS — BP 104/69 | HR 64 | Temp 97.5°F | Resp 16 | Wt 210.5 lb

## 2018-10-12 DIAGNOSIS — J90 Pleural effusion, not elsewhere classified: Secondary | ICD-10-CM | POA: Diagnosis not present

## 2018-10-12 DIAGNOSIS — F17210 Nicotine dependence, cigarettes, uncomplicated: Secondary | ICD-10-CM | POA: Diagnosis not present

## 2018-10-12 DIAGNOSIS — J910 Malignant pleural effusion: Secondary | ICD-10-CM | POA: Diagnosis not present

## 2018-10-12 DIAGNOSIS — Z8249 Family history of ischemic heart disease and other diseases of the circulatory system: Secondary | ICD-10-CM | POA: Insufficient documentation

## 2018-10-12 DIAGNOSIS — Z5112 Encounter for antineoplastic immunotherapy: Secondary | ICD-10-CM | POA: Insufficient documentation

## 2018-10-12 DIAGNOSIS — Z79899 Other long term (current) drug therapy: Secondary | ICD-10-CM | POA: Diagnosis not present

## 2018-10-12 DIAGNOSIS — R5383 Other fatigue: Secondary | ICD-10-CM | POA: Diagnosis not present

## 2018-10-12 DIAGNOSIS — Z7189 Other specified counseling: Secondary | ICD-10-CM

## 2018-10-12 DIAGNOSIS — C3490 Malignant neoplasm of unspecified part of unspecified bronchus or lung: Secondary | ICD-10-CM

## 2018-10-12 DIAGNOSIS — R42 Dizziness and giddiness: Secondary | ICD-10-CM | POA: Insufficient documentation

## 2018-10-12 DIAGNOSIS — I10 Essential (primary) hypertension: Secondary | ICD-10-CM | POA: Insufficient documentation

## 2018-10-12 DIAGNOSIS — C3410 Malignant neoplasm of upper lobe, unspecified bronchus or lung: Secondary | ICD-10-CM | POA: Insufficient documentation

## 2018-10-12 DIAGNOSIS — Z951 Presence of aortocoronary bypass graft: Secondary | ICD-10-CM | POA: Insufficient documentation

## 2018-10-12 DIAGNOSIS — R630 Anorexia: Secondary | ICD-10-CM | POA: Insufficient documentation

## 2018-10-12 DIAGNOSIS — J439 Emphysema, unspecified: Secondary | ICD-10-CM | POA: Insufficient documentation

## 2018-10-12 DIAGNOSIS — Z794 Long term (current) use of insulin: Secondary | ICD-10-CM | POA: Insufficient documentation

## 2018-10-12 DIAGNOSIS — E785 Hyperlipidemia, unspecified: Secondary | ICD-10-CM | POA: Insufficient documentation

## 2018-10-12 DIAGNOSIS — Z7982 Long term (current) use of aspirin: Secondary | ICD-10-CM | POA: Insufficient documentation

## 2018-10-12 DIAGNOSIS — Z791 Long term (current) use of non-steroidal anti-inflammatories (NSAID): Secondary | ICD-10-CM | POA: Diagnosis not present

## 2018-10-12 DIAGNOSIS — Z720 Tobacco use: Secondary | ICD-10-CM | POA: Diagnosis not present

## 2018-10-12 DIAGNOSIS — E119 Type 2 diabetes mellitus without complications: Secondary | ICD-10-CM | POA: Insufficient documentation

## 2018-10-12 DIAGNOSIS — I2510 Atherosclerotic heart disease of native coronary artery without angina pectoris: Secondary | ICD-10-CM | POA: Insufficient documentation

## 2018-10-12 NOTE — Progress Notes (Signed)
Hematology/Oncologyfollow up  note [facility] [phone] [phone]   Patient Care Team: Tonia Ghent, MD as PCP - General (Family Medicine) Thelma Comp, Colma (Optometry) Telford Nab, RN as Registered Nurse  REFERRING PROVIDER: Tonia Ghent, MD  CHIEF COMPLAINTS/REASON FOR VISIT:  Follow-up for metastatic lung adenocarcinoma  HISTORY OF PRESENTING ILLNESS:   Craig Koch is a  63 y.o.  male with PMH listed below was seen in consultation at the request of  Tonia Ghent, MD  for evaluation of malignant pleural effusion. Patient was recently admitted to [facility] due to large amount of pleural effusion, acute hypoxic respiratory failure. Patient reports feeling shortness of breath and dyspnea on exertion for several weeks and presented to emergency room.  A chest x-ray 08/23/2018 showed a large amount of left pleural effusion 08/24/2018 CT chest abdomen pelvis confirmed large left pleural effusion with near complete collapse of the left lower lobe and extensive volume loss in the lingula and a portion of the left upper lobe. No acute intra-abdominal process.  Cholelithiasis, diverticulitis, coronary atherosclerosis post CABG.  Bilateral renal cortical scarring.  Aortic atherosclerosis.  #08/24/2018 ultrasound guided diagnostic and therapeutic thoracentesis removed 1.8 L of pleural fluid. Patient subsequently felt better and was discharged to follow-up with pulmonology. 09/04/2018 patient followed up with Dr.Aleskerov chest x-ray showed recurrent moderate left pleural effusion.  Patient underwent ultrasound-guided left thoracentesis again and it drained 3 L of fluid. Pleural fluid cytology positive for adenocarcinoma. Patient was referred to me for further evaluation and management.  # PET eft sided pleural hypermetabolic activity  consistent with malignant pleural effusion. Left upper and lower lobe low-level hypermetabolic him  corresponding to areas of airspace and groundglass opacity.  This was felt to be secondary to atelectasis.  Underlying left upper lobe primary bronchogenic carcinoma cannot be excluded. Medial left upper lobe area more focal hypermetabolic area could represent a pleural disease or a site of small left upper lobe primary. No evidence of extra thoracic hypermetabolic metastatic disease.  Left sided hydro-pneumothorax within the pleural air component being new since the prior CT. Cholelithiasis.  #MRI brain showed punctate focus of enhancement within the right parietal lobe cortex suspicious for possible metastasis.   INTERVAL HISTORY Craig Koch is a 63 y.o. 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  Problems and complaints are listed below: Patient had Pleurx placed by Dr. Genevive Bi on 10/01/2018. He adamantly does not want any chemotherapy.  Mediport was not placed. Today he presents to discuss management plan. He reports breathing has significantly improved.  Pleurx is being drained every day, around 500 cc each day per patient. Denies any fever, chills, chest pain, cough, abdominal pain. I called patient's wife during the encounter. Continue to have poor appetite. Weight is stable  Wife reports that patient has some lightheaded when he gets up from sitting position.   Review of Systems  Constitutional: Positive for appetite change, fatigue and unexpected weight change. Negative for chills and fever.  HENT:   Negative for hearing loss and voice change.   Eyes: Negative for eye problems and icterus.  Respiratory: Negative for chest tightness, cough and shortness of breath.   Cardiovascular: Negative for chest pain and leg swelling.  Gastrointestinal: Negative for abdominal distention and abdominal pain.  Endocrine: Negative for hot flashes.  Genitourinary: Negative for difficulty urinating,  dysuria and frequency.  Musculoskeletal: Negative for arthralgias.  Skin: Negative for itching and rash.  Neurological: Negative for light-headedness and numbness.  Hematological: Negative for adenopathy. Does not bruise/bleed easily.  Psychiatric/Behavioral: Negative for confusion.    MEDICAL HISTORY:  Past Medical History:  Diagnosis Date   Anxiety    Arthritis    Cancer ([place])    lung   Coronary artery disease    Diabetes mellitus    Dyslipidemia    Hx of CABG    Hyperlipidemia    Hypertension    Pleural effusion     SURGICAL HISTORY: Past Surgical History:  Procedure Laterality Date   CATARACT EXTRACTION Left 09/2015   CHEST TUBE INSERTION Left 10/01/2018   Procedure: INSERTION PLEURAL DRAINAGE CATHETER;  Surgeon: [name], MD;  Location: [facility] ORS;  Service: Thoracic;  Laterality: Left;   CORONARY ARTERY BYPASS GRAFT  2004   (CABG with LIMA to the  LAD, SVG to OM2/OM3, SVG  to diag   Left ankle surgery     repair of fracture   Right lower leg surgery     rod    SOCIAL HISTORY: Social History   Socioeconomic History   Marital status: Married    Spouse name: Not on file   Number of children: Not on file   Years of education: Not on file   Highest education level: Not on file  Occupational History   Not on file  Social Needs   Financial resource strain: Not on file   Food insecurity    Worry: Not on file    Inability: Not on file   Transportation needs    Medical: Not on file    Non-medical: Not on file  Tobacco Use   Smoking status: Current Every Day Smoker    Packs/day: 0.25    Years: 45.00    Pack years: 11.25    Types: Cigarettes   Smokeless tobacco: Former Systems developer    Types: Snuff  Substance and Sexual Activity   Alcohol use: Yes    Alcohol/week: 6.0 standard drinks    Types: 6 Cans of beer per week    Comment: occ, average 6 pack in a week   Drug use: No   Sexual activity: Never  Lifestyle   Physical activity     Days per week: Not on file    Minutes per session: Not on file   Stress: Not on file  Relationships   Social connections    Talks on phone: Not on file    Gets together: Not on file    Attends religious service: Not on file    Active member of club or organization: Not on file    Attends meetings of clubs or organizations: Not on file    Relationship status: Not on file   Intimate partner violence    Fear of current or ex partner: Not on file    Emotionally abused: Not on file    Physically abused: Not on file    Forced sexual activity: Not on file  Other Topics Concern   Not on file  Social History Narrative   On disability 2009 after prev injuries and CAD.     Married 1976   2 kids, 4 grandkids.     FAMILY HISTORY: Family History  Problem Relation Age of Onset   Dementia Mother    Heart disease Father    Colon cancer Neg Hx    Prostate cancer Neg Hx    Diabetes Neg Hx  ALLERGIES:  is allergic to lipitor [atorvastatin calcium].  MEDICATIONS:  Current Outpatient Medications  Medication Sig Dispense Refill   albuterol (VENTOLIN HFA) 108 (90 Base) MCG/ACT inhaler Inhale 2 puffs into the lungs every 6 (six) hours as needed for wheezing or shortness of breath. 18 g 0   aspirin 325 MG tablet Take 325 mg by mouth daily.     BD INSULIN SYRINGE U/F 31G X 5/16" 0.5 ML MISC USE DAILY AS DIRECTED. DX E11.9 100 each 1   DULoxetine (CYMBALTA) 30 MG capsule Take 1 capsule (30 mg total) by mouth daily.     Fexofenadine HCl (ALLEGRA ALLERGY PO) Take 1 tablet by mouth daily as needed.     furosemide (LASIX) 20 MG tablet Take 1 tablet (20 mg total) by mouth daily. 30 tablet 0   glucose blood (ONE TOUCH ULTRA TEST) test strip USE TO TEST GLUCOSE LEVELS 4 (FOUR) TIMES DAILY. 400 each 3   ibuprofen (ADVIL,MOTRIN) 200 MG tablet Take 400 mg by mouth every 6 (six) hours as needed.      insulin detemir (LEVEMIR) 100 UNIT/ML injection Inject 0.3 mLs (30 Units total) into  the skin at bedtime. 10 mL 5   insulin lispro (HUMALOG) 100 UNIT/ML injection Inject 0.15 mLs (15 Units total) into the skin 3 (three) times daily with meals.     lisinopril (ZESTRIL) 2.5 MG tablet TAKE 1 TABLET BY MOUTH DAILY 90 tablet 2   metFORMIN (GLUCOPHAGE) 500 MG tablet TAKE 1 TABLET (500 MG TOTAL) BY MOUTH 2 (TWO) TIMES DAILY WITH A MEAL. 180 tablet 1   metoprolol tartrate (LOPRESSOR) 100 MG tablet TAKE 0.5 TABLETS (50 MG TOTAL) BY MOUTH 2 (TWO) TIMES DAILY. 90 tablet 1   Omega-3 Fatty Acids (FISH OIL) 1200 MG CAPS Take 2 capsules by mouth 3 (three) times daily with meals.     oxyCODONE-acetaminophen (PERCOCET) 5-325 MG tablet Take 1 tablet by mouth every 4 (four) hours as needed for severe pain. 10 tablet 0   rosuvastatin (CRESTOR) 10 MG tablet TAKE 1 TABLET (10 MG TOTAL) BY MOUTH DAILY. 90 tablet 3   No current facility-administered medications for this visit.      PHYSICAL EXAMINATION: ECOG PERFORMANCE STATUS: 1 - Symptomatic but completely ambulatory Vitals:   10/12/18 0913  BP: 104/69  Pulse: 64  Resp: 16  Temp: (!) 97.5 F (36.4 C)   Filed Weights   10/12/18 0913  Weight: 210 lb 8 oz (95.5 kg)    Physical Exam Constitutional:      General: He is not in acute distress.    Appearance: He is ill-appearing.  HENT:     Head: Normocephalic and atraumatic.  Eyes:     General: No scleral icterus.    Pupils: Pupils are equal, round, and reactive to light.  Neck:     Musculoskeletal: Normal range of motion and neck supple.  Cardiovascular:     Rate and Rhythm: Normal rate and regular rhythm.     Heart sounds: Normal heart sounds.  Pulmonary:     Effort: Pulmonary effort is normal. No respiratory distress.     Breath sounds: No wheezing.     Comments: Improved breathing sounds left lower lobe.  Still decreased. Abdominal:     General: Bowel sounds are normal. There is no distension.     Palpations: Abdomen is soft. There is no mass.     Tenderness: There is  no abdominal tenderness.  Musculoskeletal: Normal range of motion.  General: No deformity.  Skin:    General: Skin is warm and dry.     Findings: No erythema or rash.  Neurological:     Mental Status: He is alert and oriented to person, place, and time.     Cranial Nerves: No cranial nerve deficit.     Coordination: Coordination normal.  Psychiatric:        Behavior: Behavior normal.        Thought Content: Thought content normal.     LABORATORY DATA:  I have reviewed the data as listed Lab Results  Component Value Date   WBC 10.1 09/28/2018   HGB 14.4 09/28/2018   HCT 42.6 09/28/2018   MCV 96.2 09/28/2018   PLT 331 09/28/2018   Recent Labs    07/31/18 0942 08/23/18 1848 08/24/18 0801 08/30/18 1402 09/28/18 1133  NA 139 137  --  141 139  K 4.3 3.9  --  4.2 4.4  CL 101 103  --  101 100  CO2 29 26  --  30 25  GLUCOSE 157* 185*  --  194* 294*  BUN 23 18  --  20 14  CREATININE 0.89 0.88  --  0.87 0.78  CALCIUM 9.9 9.1  --  9.7 8.9  GFRNONAA  --  >60  --   --  >60  GFRAA  --  >60  --   --  >60  PROT 6.7  --  6.8 6.8 6.8  ALBUMIN 4.2  --   --  4.1 3.3*  AST 12  --   --  12 13*  ALT 10  --   --  8 15  ALKPHOS 61  --   --  66 102  BILITOT 0.6  --   --  0.6 1.1   Iron/TIBC/Ferritin/ %Sat No results found for: IRON, TIBC, FERRITIN, IRONPCTSAT    RADIOGRAPHIC STUDIES: I have personally reviewed the radiological images as listed and agreed with the findings in the report.  Dg Chest 2 View  Result Date: 10/04/2018 CLINICAL DATA:  Left lung cancer, follow-up chest tube EXAM: CHEST - 2 VIEW COMPARISON:  10/02/2018 FINDINGS: Left-sided hydropneumothorax with left-sided chest tube and slight interval increase in a small left pleural effusion component with associated extensive diffuse interstitial pulmonary opacity and partial atelectasis of the left lung. There remains a small, approximately 10% left pneumothorax, possibly with a trapped left lung. The right lung is  normally aerated. Increased subcutaneous emphysema about the left chest wall and neck. Status post median sternotomy and CABG. IMPRESSION: 1. Left-sided hydropneumothorax with left-sided chest tube and slight interval increase in a small left pleural effusion component with associated extensive diffuse interstitial pulmonary opacity and partial atelectasis of the left lung. 2. There remains a small, approximately 10% left pneumothorax, possibly with a trapped left lung. 3. Increased subcutaneous emphysema about the left chest wall and neck. Electronically Signed   By: Eddie Candle M.D.   On: 10/04/2018 13:28   Chest 2 View  Result Date: 09/28/2018 CLINICAL DATA:  63 year old male under preoperative evaluation prior to scheduled surgery. Pleural effusion. Shortness of breath. EXAM: CHEST - 2 VIEW COMPARISON:  Chest x-ray 09/13/2018.  PET-CT 09/18/2018. FINDINGS: Large left-sided hydropneumothorax again noted, with small pneumothorax component in the apex of the left hemithorax and large pleural fluid component. No aerated left lung is identified, presumably secondary to underlying atelectasis. The right lung is clear. No right pleural effusion. No evidence of pulmonary edema. Cardiac silhouette is largely obscured. Status  post median sternotomy for CABG. IMPRESSION: 1. Large left hydropneumothorax with probable complete atelectasis of the left lung. The hydropneumothorax is similar to recent PET-CT, although the degree of atelectasis in the left lung has increased. Electronically Signed   By: Vinnie Langton M.D.   On: 09/28/2018 16:44   Mr Jeri Cos VW Contrast  Result Date: 09/21/2018 CLINICAL DATA:  Malignant neoplasm of unspecified part of unspecified bronchus or lung. Lung cancer, non-small cell, staging EXAM: MRI HEAD WITHOUT AND WITH CONTRAST TECHNIQUE: Multiplanar, multiecho pulse sequences of the brain and surrounding structures were obtained without and with intravenous contrast. CONTRAST:  8mL  GADAVIST GADOBUTROL 1 MMOL/ML IV SOLN COMPARISON:  No pertinent prior studies available for comparison. FINDINGS: Brain: A punctate focus of enhancement within the right parietal lobe cortex is suspicious for possible metastasis (series 18, image 114). No other foci of abnormal intracranial enhancement are identified. There is no acute infarct. No midline shift or extra-axial fluid collection. No chronic intracranial hemorrhage. Mild scattered T2/FLAIR hyperintensity within the cerebral white matter and brainstem is nonspecific, but consistent with chronic small vessel ischemic disease. Mild generalized parenchymal atrophy. Vascular: Flow voids maintained within the proximal large arterial vessels. Skull and upper cervical spine: Normal marrow signal. Sinuses/Orbits: Imaged globes and orbits demonstrate no acute abnormality. Mild mucosal thickening within a small right maxillary sinus. No significant mastoid effusion. IMPRESSION: Punctate focus of enhancement within the right parietal lobe cortex suspicious for possible metastasis. Three-month contrast-enhanced MRI follow-up is recommended. Mild generalized parenchymal atrophy and chronic small vessel ischemic disease. Right maxillary sinus disease as described. Electronically Signed   By: Kellie Simmering   On: 09/21/2018 10:00   Nm Pet Image Initial (pi) Skull Base To Thigh  Result Date: 09/18/2018 CLINICAL DATA:  Initial treatment strategy for lung nodule. Staging. Left-sided thoracentesis demonstrating adenocarcinoma EXAM: NUCLEAR MEDICINE PET SKULL BASE TO THIGH TECHNIQUE: 11.5 mCi F-18 FDG was injected intravenously. Full-ring PET imaging was performed from the skull base to thigh after the radiotracer. CT data was obtained and used for attenuation correction and anatomic localization. Fasting blood glucose: 129 mg/dl COMPARISON:  08/24/2018 chest abdomen and pelvic CTs. FINDINGS: Mediastinal blood pool activity: SUV max 2.3 Liver activity: SUV max NA NECK: No  areas of abnormal hypermetabolism. Incidental CT findings: Mucosal thickening right maxillary sinus. Bilateral carotid atherosclerosis. No cervical adenopathy. CHEST: Low-level hypermetabolism corresponding to areas of patchy consolidation within the central left upper and less so medial left lower lobe. Example within the central left upper lobe relatively diffusely at a S.U.V. max of 5.2 including on image 105/3. The most focal area of hypermetabolism and nodularity is along the pleural surface adjacent the medial right upper lobe. This measures on the order of a S.U.V. max of 5.5, including on image 90/3. Multifocal parietal pleural hypermetabolism, including corresponding to nodularity at 1.1 cm and a S.U.V. max of 4.1 on 87/3. Incidental CT findings: Left-sided hydropneumothorax. The pleural air component is small to moderate. The amount of left-sided pleural fluid is relatively similar to the CT of 08/23/2018. Emphysema. Aortic and coronary artery atherosclerosis with prior median sternotomy. ABDOMEN/PELVIS: No abdominopelvic parenchymal or nodal hypermetabolism. Incidental CT findings: Normal adrenal glands. Left renal vascular calcification. Dependent gallstone or stones. Abdominal aortic atherosclerosis. SKELETON: No abnormal marrow activity. Incidental CT findings: none IMPRESSION: 1. Left-sided pleural hypermetabolism, consistent with malignant pleural effusion. 2. Left upper and less so left lower lobe low-level hypermetabolism corresponding to areas of airspace and ground-glass opacity. This is primarily felt to  represent atelectasis. Underlying left upper lobe primary bronchogenic carcinoma cannot be excluded. This is suboptimally evaluated secondary to the extent of volume loss from hydropneumothorax. 3. A medial left upper lobe area of more focal hypermetabolism could also represent pleural disease or a site of a small left upper lobe primary. 4. No evidence of extrathoracic hypermetabolic  metastasis. 5. Left-sided hydropneumothorax with the pleural air component being new since the prior CT. 6. Cholelithiasis. These results will be called to the ordering clinician or representative by the Radiologist Assistant, and communication documented in the PACS or zVision Dashboard. Electronically Signed   By: Abigail Miyamoto M.D.   On: 09/18/2018 15:26   Dg Chest Port 1 View  Result Date: 10/02/2018 CLINICAL DATA:  Postop check EXAM: PORTABLE CHEST 1 VIEW COMPARISON:  Yesterday FINDINGS: Tracheal extubation. A left chest tube remains. Moderate left pneumothorax seen at the base more than apex. Small volume pleural fluid at the left base, diminished. The right lung remains clear. Normal heart size. CABG. IMPRESSION: Moderate left pneumothorax after pleural fluid evacuation. Electronically Signed   By: Monte Fantasia M.D.   On: 10/02/2018 09:29   Dg Chest Port 1 View  Result Date: 10/01/2018 CLINICAL DATA:  Postop left-sided catheter placement for malignant pleural effusion. EXAM: PORTABLE CHEST 1 VIEW COMPARISON:  09/28/2018 FINDINGS: There is a new left-sided tunneled chest tube. One of the sideholes may be straddling the chest wall. There is a large left-sided pleural effusion. The heart size is likely stable but evaluation is limited by the presence of a large pleural effusion. The patient is status post prior median sternotomy. There is no clear pneumothorax. No acute osseous abnormality. IMPRESSION: 1. Interval placement of a tunneled left-sided PleurX catheter. The last side hole may be straddling the chest wall. This can be further evaluated with oblique imaging. 2. Large left-sided pleural effusion.  No pneumothorax. Electronically Signed   By: Constance Holster M.D.   On: 10/01/2018 09:09   Dg Chest Port 1 View  Result Date: 09/13/2018 CLINICAL DATA:  History of lung cancer now with recurrent symptomatic left-sided pleural effusion, post thoracentesis. EXAM: PORTABLE CHEST 1 VIEW  COMPARISON:  09/07/2018 09/05/2018; chest CT-08/23/2018; ultrasound-guided thoracentesis - 09/05/2018 yielding 3 L pleural fluid FINDINGS: Grossly unchanged cardiac silhouette and mediastinal contours post median sternotomy and CABG. Stable postsurgical change of the left hilum. Interval reduction/near resolution of left-sided pleural effusion post large volume left-sided thoracentesis. Improved aeration of left lung base with persistent left perihilar and basilar heterogeneous/consolidative opacities. Mild pulmonary is congestion without frank evidence of edema. The right lung remains well aerated. No pneumothorax. No acute osseous abnormalities. IMPRESSION: 1. Interval reduction/near resolution left-sided effusion post thoracentesis. No pneumothorax. 2. Improved aeration of the left lung with persistent left perihilar and basilar opacities, likely atelectasis. Electronically Signed   By: Sandi Mariscal M.D.   On: 09/13/2018 09:05   US Thoracentesis Asp Pleural Space W/img Guide  Result Date: 09/13/2018 INDICATION: History of lung cancer, now with recurrent left-sided pleural effusion. Patient presents today for ultrasound-guided left-sided thoracentesis for therapeutic purposes. EXAM: US THORACENTESIS ASP PLEURAL SPACE W/IMG GUIDE COMPARISON:  Previous large volume left-sided thoracentesis performed 09/05/2018 (yielding 3 L) and 08/24/2018 (yielding 1.8 L). MEDICATIONS: None. COMPLICATIONS: None immediate. TECHNIQUE: Informed written consent was obtained from the patient after a discussion of the risks, benefits and alternatives to treatment. A timeout was performed prior to the initiation of the procedure. Initial ultrasound scanning demonstrates a large recurrent anechoic left-sided pleural effusion. The lower  chest was prepped and draped in the usual sterile fashion. 1% lidocaine was used for local anesthesia. An ultrasound image was saved for documentation purposes. An 8 Fr Safe-T-Centesis catheter was  introduced. The thoracentesis was performed. The catheter was removed and a dressing was applied. The patient tolerated the procedure well without immediate post procedural complication. The patient was escorted to have an upright chest radiograph. FINDINGS: A total of approximately 3 liters of slightly blood-tinged fluid was removed. IMPRESSION: Successful ultrasound-guided left sided thoracentesis yielding 3 liters of pleural fluid. Electronically Signed   By: Sandi Mariscal M.D.   On: 09/13/2018 09:08      ASSESSMENT & PLAN:  1. lung adenocarcinoma   2. Recurrent pleural effusion on left   3. Goals of care, counseling/discussion   4. Tobacco abuse    #Stage IV metastatic lung adenocarcinoma TxNx M1 Malignant pleural effusion, M1 disease. 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 PET scan and MRI images were independently reviewed and discussed with patient and wife. I had a lengthy discussion with both patient, and with the wife over the phone Prognosis is poor.  Treatment is with palliative intent. Patient adamantly refuses chemotherapy - Carbo/Alimta/Avastin We discussed about sending his cancer specimen for NGS testing  I discussed the mechanism of action and rationale of using immunotherapy-Keytruda every 3 weeks.  The goal of therapy is palliative; and length of treatments are likely ongoing/based upon the results of the scans. Discussed the potential side effects of immunotherapy including but not limited to diarrhea; skin rash; respiratory failure, kidney failure, mental status change, elevated LFTs/liver failure,endocrine abnormalities, acute deterioration  and even death,etc. patient voices understanding and agrees with proceeding with treatment.  Wife also agrees with the plan. Patient may consider other treatment modalities.  He wants to see how immunotherapy goes. Not interested in clinical trials at this point.  #Recurrent pleural effusion, status post Pleurx catheter.  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 Baruch Gouty.  Declines radiation. # Smoke cessation discussed with patient. Follow-up with palliative care Supportive care measures are necessary for patient well-being and will be provided as necessary. We spent sufficient time to discuss many aspect of care, questions were answered to patient's satisfaction.     Orders Placed This Encounter  Procedures   Novel Coronavirus, NAA ([facility])    Standing Status:   Future    Standing Expiration Date:   10/12/2019    Order Specific Question:   Is this test for diagnosis or screening    Answer:   Screening    Order Specific Question:   Symptomatic for COVID-19 as defined by CDC    Answer:   No    Order Specific Question:   Hospitalized for COVID-19    Answer:   No    Order Specific Question:   Admitted to ICU for COVID-19    Answer:   No    Order Specific Question:   Previously tested for COVID-19    Answer:   No    Order Specific Question:   Resident in a congregate (group) care setting    Answer:   No    Order Specific Question:   Is the patient student?    Answer:   No    Order Specific Question:   Employed in healthcare setting    Answer:   No    All questions were answered. The patient knows to call the clinic with any problems questions or concerns.  Return of visit:  1 week.   Earlie Server, MD, PhD Hematology Oncology [facility] at [facility] Pager- [phone] 10/12/2018

## 2018-10-12 NOTE — Telephone Encounter (Signed)
Omniseq request faxed to [facility] pathology. Fax confirmation received.

## 2018-10-12 NOTE — Progress Notes (Signed)
Patient does not offer any problems today.  

## 2018-10-12 NOTE — Progress Notes (Signed)
START ON PATHWAY REGIMEN - Non-Small Cell Lung     A cycle is every 21 days:     Paclitaxel      Carboplatin      Bevacizumab-xxxx   **Always confirm dose/schedule in your pharmacy ordering system**  Patient Characteristics: Stage IV Metastatic, Nonsquamous, Initial Chemotherapy/Immunotherapy, PS = 0, 1, ALK or EGFR or ROS1 or NTRK or MET or RET Genomic Alterations - Awaiting Test Results AJCC T Category: TX Current Disease Status: Distant Metastases AJCC N Category: NX AJCC M Category: M1a AJCC 8 Stage Grouping: IVA Histology: Nonsquamous Cell ROS1 Rearrangement Status: Awaiting Test Results T790M Mutation Status: Not Applicable - EGFR Mutation Negative/Unknown Other Mutations/Biomarkers: No Other Actionable Mutations Chemotherapy/Immunotherapy LOT: Initial Chemotherapy/Immunotherapy Molecular Targeted Therapy: Not Appropriate MET Exon 14 Mutation Status: Awaiting Test Results RET Gene Fusion Status: Awaiting Test Results EGFR Mutation Status: Awaiting Test Results NTRK Gene Fusion Status: Awaiting Test Results PD-L1 Expression Status: Awaiting Test Results ALK Rearrangement Status: Awaiting Test Results BRAF V600E Mutation Status: Awaiting Test Results ECOG Performance Status: 1 Intent of Therapy: Non-Curative / Palliative Intent, Discussed with Patient

## 2018-10-15 ENCOUNTER — Ambulatory Visit (INDEPENDENT_AMBULATORY_CARE_PROVIDER_SITE_OTHER): Payer: Medicare Other | Admitting: Cardiothoracic Surgery

## 2018-10-15 ENCOUNTER — Ambulatory Visit
Admission: RE | Admit: 2018-10-15 | Discharge: 2018-10-15 | Disposition: A | Discharge status: Home / Self Care | Payer: Medicare Other | Source: Ambulatory Visit | Attending: Cardiothoracic Surgery | Admitting: Cardiothoracic Surgery

## 2018-10-15 ENCOUNTER — Other Ambulatory Visit: Payer: Self-pay

## 2018-10-15 ENCOUNTER — Encounter: Payer: Self-pay | Admitting: Cardiothoracic Surgery

## 2018-10-15 VITALS — BP 125/76 | HR 109 | Temp 97.7°F | Resp 16 | Ht 72.0 in | Wt 210.8 lb

## 2018-10-15 DIAGNOSIS — C3490 Malignant neoplasm of unspecified part of unspecified bronchus or lung: Secondary | ICD-10-CM | POA: Diagnosis not present

## 2018-10-15 DIAGNOSIS — J90 Pleural effusion, not elsewhere classified: Secondary | ICD-10-CM

## 2018-10-15 DIAGNOSIS — J910 Malignant pleural effusion: Secondary | ICD-10-CM | POA: Diagnosis not present

## 2018-10-15 NOTE — Patient Instructions (Signed)
Pembrolizumab injection What is this medicine? PEMBROLIZUMAB (pem broe liz ue mab) is a monoclonal antibody. It is used to treat bladder cancer, cervical cancer, endometrial cancer, esophageal cancer, head and neck cancer, hepatocellular cancer, Hodgkin lymphoma, kidney cancer, lymphoma, melanoma, Merkel cell carcinoma, lung cancer, stomach cancer, urothelial cancer, and cancers that have a certain genetic condition. This medicine may be used for other purposes; ask your health care provider or pharmacist if you have questions. COMMON BRAND NAME(S): Keytruda What should I tell my health care provider before I take this medicine? They need to know if you have any of these conditions:  diabetes  immune system problems  inflammatory bowel disease  liver disease  lung or breathing disease  lupus  received or scheduled to receive an organ transplant or a stem-cell transplant that uses donor stem cells  an unusual or allergic reaction to pembrolizumab, other medicines, foods, dyes, or preservatives  pregnant or trying to get pregnant  breast-feeding How should I use this medicine? This medicine is for infusion into a vein. It is given by a health care professional in a hospital or clinic setting. A special MedGuide will be given to you before each treatment. Be sure to read this information carefully each time. Talk to your pediatrician regarding the use of this medicine in children. While this drug may be prescribed for selected conditions, precautions do apply. Overdosage: If you think you have taken too much of this medicine contact a poison control center or emergency room at once. NOTE: This medicine is only for you. Do not share this medicine with others. What if I miss a dose? It is important not to miss your dose. Call your doctor or health care professional if you are unable to keep an appointment. What may interact with this medicine? Interactions have not been studied. Give  your health care provider a list of all the medicines, herbs, non-prescription drugs, or dietary supplements you use. Also tell them if you smoke, drink alcohol, or use illegal drugs. Some items may interact with your medicine. This list may not describe all possible interactions. Give your health care provider a list of all the medicines, herbs, non-prescription drugs, or dietary supplements you use. Also tell them if you smoke, drink alcohol, or use illegal drugs. Some items may interact with your medicine. What should I watch for while using this medicine? Your condition will be monitored carefully while you are receiving this medicine. You may need blood work done while you are taking this medicine. Do not become pregnant while taking this medicine or for 4 months after stopping it. Women should inform their doctor if they wish to become pregnant or think they might be pregnant. There is a potential for serious side effects to an unborn child. Talk to your health care professional or pharmacist for more information. Do not breast-feed an infant while taking this medicine or for 4 months after the last dose. What side effects may I notice from receiving this medicine? Side effects that you should report to your doctor or health care professional as soon as possible:  allergic reactions like skin rash, itching or hives, swelling of the face, lips, or tongue  bloody or black, tarry  breathing problems  changes in vision  chest pain  chills  confusion  constipation  cough  diarrhea  dizziness or feeling faint or lightheaded  fast or irregular heartbeat  fever  flushing  hair loss  joint pain  low blood counts - this  medicine may decrease the number of white blood cells, red blood cells and platelets. You may be at increased risk for infections and bleeding.  muscle pain  muscle weakness  persistent headache  redness, blistering, peeling or loosening of the skin,  including inside the mouth  signs and symptoms of high blood sugar such as dizziness; dry mouth; dry skin; fruity breath; nausea; stomach pain; increased hunger or thirst; increased urination  signs and symptoms of kidney injury like trouble passing urine or change in the amount of urine  signs and symptoms of liver injury like dark urine, light-colored stools, loss of appetite, nausea, right upper belly pain, yellowing of the eyes or skin  sweating  swollen lymph nodes  weight loss Side effects that usually do not require medical attention (report to your doctor or health care professional if they continue or are bothersome):  decreased appetite  muscle pain  tiredness This list may not describe all possible side effects. Call your doctor for medical advice about side effects. You may report side effects to FDA at 1-800-FDA-1088. Where should I keep my medicine? This drug is given in a hospital or clinic and will not be stored at home. NOTE: This sheet is a summary. It may not cover all possible information. If you have questions about this medicine, talk to your doctor, pharmacist, or health care provider.  2020 [employer]vier/Gold Standard (2018-01-16 13:46:58)

## 2018-10-15 NOTE — Progress Notes (Signed)
  Patient ID: Craig Koch, male   DOB: 04-09-1955, 63 y.o.   MRN: [id number]  HISTORY: Craig Koch comes in today in follow-up of his Pleurx catheter.  He brings with him his flow sheet showing 550 cc withdrawn every day for the last week.  Unfortunately they only have the 500 cc bottles and therefore unable to drain more than that.  I did explain to them that they could use 2 bottles if need be but we will attempt to get them the 1000 cc bottles.  He has had no problems with his drainage.  His wife is been keeping the area clean.  He is not short of breath at this time.  He did have a chest x-ray made this morning which I have reviewed.  He met with Dr. Tasia Catchings last week and he is beginning his immunotherapy this week as well.   Vitals:   10/15/18 1102  BP: 125/76  Pulse: (!) 109  Resp: 16  Temp: 97.7 F (36.5 C)  SpO2: 98%     EXAM:    Resp: Lungs are clear on the right but diminished at the left base.  No respiratory distress, normal effort. Heart:  Regular without murmurs Abd:  Abdomen is soft, non distended and non tender. No masses are palpable.  There is no rebound and no guarding.  Neurological: Alert and oriented to person, place, and time. Coordination normal.  Skin: Skin is warm and dry. No rash noted. No diaphoretic. No erythema. No pallor.  Psychiatric: Normal mood and affect. Normal behavior. Judgment and thought content normal.    ASSESSMENT: Independent review of his chest x-ray shows left subcutaneous emphysema but with a persistent left pleural effusion   PLAN:   I removed all the sutures today.  I instructed him to continue to drain the catheter daily.  We will attempt to get them the 1000 cc bottles.  If not they can use 2 bottles if need be.  He will have a chest x-ray made when he comes back to see me.    Craig Lewandowsky, MD

## 2018-10-15 NOTE — Patient Instructions (Addendum)
Please continue to drain daily.  Please see your follow up appointment listed below. Please be sure to have your chest xray prior to seeing Dr.Oaks.

## 2018-10-16 ENCOUNTER — Inpatient Hospital Stay: Payer: Medicare Other | Admitting: Oncology

## 2018-10-16 ENCOUNTER — Inpatient Hospital Stay: Payer: Medicare Other

## 2018-10-16 ENCOUNTER — Other Ambulatory Visit: Payer: Medicare Other

## 2018-10-16 ENCOUNTER — Other Ambulatory Visit: Payer: Self-pay

## 2018-10-16 ENCOUNTER — Other Ambulatory Visit
Admission: RE | Admit: 2018-10-16 | Discharge: 2018-10-16 | Disposition: A | Discharge status: Home / Self Care | Payer: Medicare Other | Source: Ambulatory Visit | Attending: Oncology | Admitting: Oncology

## 2018-10-16 DIAGNOSIS — Z01812 Encounter for preprocedural laboratory examination: Secondary | ICD-10-CM | POA: Insufficient documentation

## 2018-10-16 DIAGNOSIS — Z20828 Contact with and (suspected) exposure to other viral communicable diseases: Secondary | ICD-10-CM | POA: Diagnosis not present

## 2018-10-17 ENCOUNTER — Telehealth: Payer: Self-pay

## 2018-10-17 LAB — SARS CORONAVIRUS 2 (TAT 6-24 HRS): SARS Coronavirus 2: NEGATIVE

## 2018-10-17 NOTE — Telephone Encounter (Signed)
Left message for patient to call back. Received noticed from [facility] that patient has not completed Cologuard test that was ordered in July for the patient. See hard copy of report. Per notes from Dr Damita Dunnings " Please check with patient. Given lung cancer Dx he may want to defer this and if so, please cancel order."

## 2018-10-18 ENCOUNTER — Other Ambulatory Visit: Payer: Self-pay

## 2018-10-18 ENCOUNTER — Encounter: Payer: Self-pay | Admitting: Oncology

## 2018-10-18 NOTE — Telephone Encounter (Signed)
Patient returned call. The patient will be available by phone tomorrow afternoon.

## 2018-10-19 ENCOUNTER — Inpatient Hospital Stay: Payer: Medicare Other

## 2018-10-19 ENCOUNTER — Other Ambulatory Visit: Payer: Self-pay

## 2018-10-19 ENCOUNTER — Inpatient Hospital Stay (HOSPITAL_BASED_OUTPATIENT_CLINIC_OR_DEPARTMENT_OTHER): Payer: Medicare Other | Admitting: Oncology

## 2018-10-19 ENCOUNTER — Ambulatory Visit: Payer: Self-pay | Admitting: Cardiothoracic Surgery

## 2018-10-19 ENCOUNTER — Encounter: Payer: Self-pay | Admitting: *Deleted

## 2018-10-19 ENCOUNTER — Telehealth: Payer: Self-pay | Admitting: *Deleted

## 2018-10-19 VITALS — BP 107/59 | HR 85 | Temp 97.3°F | Resp 16 | Wt 209.2 lb

## 2018-10-19 DIAGNOSIS — C3490 Malignant neoplasm of unspecified part of unspecified bronchus or lung: Secondary | ICD-10-CM

## 2018-10-19 DIAGNOSIS — Z7189 Other specified counseling: Secondary | ICD-10-CM

## 2018-10-19 DIAGNOSIS — J90 Pleural effusion, not elsewhere classified: Secondary | ICD-10-CM

## 2018-10-19 DIAGNOSIS — Z5112 Encounter for antineoplastic immunotherapy: Secondary | ICD-10-CM

## 2018-10-19 LAB — CBC WITH DIFFERENTIAL/PLATELET
Abs Immature Granulocytes: 0.02 10*3/uL (ref 0.00–0.07)
Basophils Absolute: 0.1 10*3/uL (ref 0.0–0.1)
Basophils Relative: 2 %
Eosinophils Absolute: 0.4 10*3/uL (ref 0.0–0.5)
Eosinophils Relative: 7 %
HCT: 30.5 % — ABNORMAL LOW (ref 39.0–52.0)
Hemoglobin: 10.2 g/dL — ABNORMAL LOW (ref 13.0–17.0)
Immature Granulocytes: 0 %
Lymphocytes Relative: 16 %
Lymphs Abs: 0.9 10*3/uL (ref 0.7–4.0)
MCH: 31.8 pg (ref 26.0–34.0)
MCHC: 33.4 g/dL (ref 30.0–36.0)
MCV: 95 fL (ref 80.0–100.0)
Monocytes Absolute: 0.7 10*3/uL (ref 0.1–1.0)
Monocytes Relative: 11 %
Neutro Abs: 3.9 10*3/uL (ref 1.7–7.7)
Neutrophils Relative %: 64 %
Platelets: 329 10*3/uL (ref 150–400)
RBC: 3.21 MIL/uL — ABNORMAL LOW (ref 4.22–5.81)
RDW: 12.9 % (ref 11.5–15.5)
WBC: 6.1 10*3/uL (ref 4.0–10.5)
nRBC: 0 % (ref 0.0–0.2)

## 2018-10-19 LAB — COMPREHENSIVE METABOLIC PANEL
ALT: 10 U/L (ref 0–44)
AST: 14 U/L — ABNORMAL LOW (ref 15–41)
Albumin: 3 g/dL — ABNORMAL LOW (ref 3.5–5.0)
Alkaline Phosphatase: 69 U/L (ref 38–126)
Anion gap: 9 (ref 5–15)
BUN: 14 mg/dL (ref 8–23)
CO2: 24 mmol/L (ref 22–32)
Calcium: 8.5 mg/dL — ABNORMAL LOW (ref 8.9–10.3)
Chloride: 104 mmol/L (ref 98–111)
Creatinine, Ser: 0.61 mg/dL (ref 0.61–1.24)
GFR calc Af Amer: >60 mL/min (ref >60)
GFR calc non Af Amer: >60 mL/min (ref >60)
Glucose, Bld: 252 mg/dL — ABNORMAL HIGH (ref 70–99)
Potassium: 4 mmol/L (ref 3.5–5.1)
Sodium: 137 mmol/L (ref 135–145)
Total Bilirubin: 0.5 mg/dL (ref 0.3–1.2)
Total Protein: 6 g/dL — ABNORMAL LOW (ref 6.5–8.1)

## 2018-10-19 LAB — TSH: TSH: 2.68 u[IU]/mL (ref 0.350–4.500)

## 2018-10-19 MED ORDER — SODIUM CHLORIDE 0.9 % IV SOLN
200.0000 mg | Freq: Once | INTRAVENOUS | Status: AC
  Administered 2018-10-19: 200 mg via INTRAVENOUS
  Filled 2018-10-19: qty 8

## 2018-10-19 MED ORDER — SODIUM CHLORIDE 0.9 % IV SOLN
Freq: Once | INTRAVENOUS | Status: AC
  Administered 2018-10-19: 09:00:00 via INTRAVENOUS
  Filled 2018-10-19: qty 250

## 2018-10-19 NOTE — Progress Notes (Signed)
Hematology/Oncologyfollow up  note [facility] Telephone:([phone]) [phone] [phone]   Patient Care Team: Tonia Ghent, MD as PCP - General (Family Medicine) Thelma Comp, [place] (Optometry) Telford Nab, RN as Registered Nurse  REFERRING PROVIDER: Tonia Ghent, MD  CHIEF COMPLAINTS/REASON FOR VISIT:  Follow-up for metastatic lung adenocarcinoma  HISTORY OF PRESENTING ILLNESS:   Craig Koch is a  63 y.o.  male with PMH listed below was seen in consultation at the request of  Tonia Ghent, MD  for evaluation of malignant pleural effusion. Patient was recently admitted to [facility] due to large amount of pleural effusion, acute hypoxic respiratory failure. Patient reports feeling shortness of breath and dyspnea on exertion for several weeks and presented to emergency room.  A chest x-ray 08/23/2018 showed a large amount of left pleural effusion 08/24/2018 CT chest abdomen pelvis confirmed large left pleural effusion with near complete collapse of the left lower lobe and extensive volume loss in the lingula and a portion of the left upper lobe. No acute intra-abdominal process.  Cholelithiasis, diverticulitis, coronary atherosclerosis post CABG.  Bilateral renal cortical scarring.  Aortic atherosclerosis.  #08/24/2018 ultrasound guided diagnostic and therapeutic thoracentesis removed 1.8 L of pleural fluid. Patient subsequently felt better and was discharged to follow-up with pulmonology. 09/04/2018 patient followed up with Dr.Aleskerov chest x-ray showed recurrent moderate left pleural effusion.  Patient underwent ultrasound-guided left thoracentesis again and it drained 3 L of fluid. Pleural fluid cytology positive for adenocarcinoma. Patient was referred to me for further evaluation and management.  # PET eft sided pleural hypermetabolic activity  consistent with malignant pleural effusion. Left upper and lower lobe low-level hypermetabolic him  corresponding to areas of airspace and groundglass opacity.  This was felt to be secondary to atelectasis.  Underlying left upper lobe primary bronchogenic carcinoma cannot be excluded. Medial left upper lobe area more focal hypermetabolic area could represent a pleural disease or a site of small left upper lobe primary. No evidence of extra thoracic hypermetabolic metastatic disease.  Left sided hydro-pneumothorax within the pleural air component being new since the prior CT. Cholelithiasis.  #MRI brain showed punctate focus of enhancement within the right parietal lobe cortex suspicious for possible metastasis.   INTERVAL HISTORY Craig Koch is a 63 y.o. 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  Problems and complaints are listed below: Patient had Pleurx placed by Dr. Genevive Bi on 10/01/2018. Patient reports that his breathing has been doing fine.  He drained about 700 cc fluid from Pleurx every day. Denies any pain today. Continues to feel fatigued. Appetite is good. Weight has been stable.    Review of Systems  Constitutional: Positive for appetite change and fatigue. Negative for chills, fever and unexpected weight change.  HENT:   Negative for hearing loss and voice change.   Eyes: Negative for eye problems and icterus.  Respiratory: Negative for chest tightness, cough and shortness of breath.   Cardiovascular: Negative for chest pain and leg swelling.  Gastrointestinal: Negative for abdominal distention and abdominal pain.  Endocrine: Negative for hot flashes.  Genitourinary: Negative for difficulty urinating, dysuria and frequency.   Musculoskeletal: Negative for arthralgias.  Skin: Negative for itching and rash.  Neurological: Negative for light-headedness and numbness.  Hematological: Negative for adenopathy. Does not bruise/bleed easily.  Psychiatric/Behavioral: Negative for  confusion.    MEDICAL HISTORY:  Past Medical History:  Diagnosis Date   Anxiety    Arthritis    Cancer ([place])    lung   Coronary artery disease    Diabetes mellitus    Dyslipidemia    Hx of CABG    Hyperlipidemia    Hypertension    Pleural effusion     SURGICAL HISTORY: Past Surgical History:  Procedure Laterality Date   CATARACT EXTRACTION Left 09/2015   CHEST TUBE INSERTION Left 10/01/2018   Procedure: INSERTION PLEURAL DRAINAGE CATHETER;  Surgeon: [name], MD;  Location: [facility] ORS;  Service: Thoracic;  Laterality: Left;   CORONARY ARTERY BYPASS GRAFT  2004   (CABG with LIMA to the  LAD, SVG to OM2/OM3, SVG  to diag   Left ankle surgery     repair of fracture   Right lower leg surgery     rod    SOCIAL HISTORY: Social History   Socioeconomic History   Marital status: Married    Spouse name: Not on file   Number of children: Not on file   Years of education: Not on file   Highest education level: Not on file  Occupational History   Not on file  Social Needs   Financial resource strain: Not on file   Food insecurity    Worry: Not on file    Inability: Not on file   Transportation needs    Medical: Not on file    Non-medical: Not on file  Tobacco Use   Smoking status: Current Every Day Smoker    Packs/day: 0.25    Years: 45.00    Pack years: 11.25    Types: Cigarettes   Smokeless tobacco: Former Systems developer    Types: Snuff  Substance and Sexual Activity   Alcohol use: Yes    Alcohol/week: 6.0 standard drinks    Types: 6 Cans of beer per week    Comment: occ, average 6 pack in a week   Drug use: No   Sexual activity: Never  Lifestyle   Physical activity    Days per week: Not on file    Minutes per session: Not on file   Stress: Not on file  Relationships   Social connections    Talks on phone: Not on file    Gets together: Not on file    Attends religious service: Not on file    Active member of club or  organization: Not on file    Attends meetings of clubs or organizations: Not on file    Relationship status: Not on file   Intimate partner violence    Fear of current or ex partner: Not on file    Emotionally abused: Not on file    Physically abused: Not on file    Forced sexual activity: Not on file  Other Topics Concern   Not on file  Social History Narrative   On disability 2009 after prev injuries and CAD.     Married 1976   2 kids, 4 grandkids.     FAMILY HISTORY: Family History  Problem Relation Age of Onset   Dementia Mother    Heart disease Father    Colon cancer Neg Hx    Prostate cancer Neg Hx    Diabetes Neg Hx     ALLERGIES:  is allergic to lipitor [atorvastatin calcium].  MEDICATIONS:  Current Outpatient Medications  Medication Sig Dispense Refill   albuterol (VENTOLIN HFA) 108 (90 Base) MCG/ACT inhaler Inhale 2 puffs into the lungs every 6 (six) hours as  needed for wheezing or shortness of breath. 18 g 0   aspirin 325 MG tablet Take 325 mg by mouth daily.     BD INSULIN SYRINGE U/F 31G X 5/16" 0.5 ML MISC USE DAILY AS DIRECTED. DX E11.9 100 each 1   DULoxetine (CYMBALTA) 30 MG capsule Take 1 capsule (30 mg total) by mouth daily.     Fexofenadine HCl (ALLEGRA ALLERGY PO) Take 1 tablet by mouth daily as needed.     glucose blood (ONE TOUCH ULTRA TEST) test strip USE TO TEST GLUCOSE LEVELS 4 (FOUR) TIMES DAILY. 400 each 3   ibuprofen (ADVIL,MOTRIN) 200 MG tablet Take 400 mg by mouth every 6 (six) hours as needed.      insulin detemir (LEVEMIR) 100 UNIT/ML injection Inject 0.3 mLs (30 Units total) into the skin at bedtime. 10 mL 5   insulin lispro (HUMALOG) 100 UNIT/ML injection Inject 0.15 mLs (15 Units total) into the skin 3 (three) times daily with meals.     lisinopril (ZESTRIL) 2.5 MG tablet TAKE 1 TABLET BY MOUTH DAILY 90 tablet 2   metFORMIN (GLUCOPHAGE) 500 MG tablet TAKE 1 TABLET (500 MG TOTAL) BY MOUTH 2 (TWO) TIMES DAILY WITH A MEAL.  180 tablet 1   metoprolol tartrate (LOPRESSOR) 100 MG tablet TAKE 0.5 TABLETS (50 MG TOTAL) BY MOUTH 2 (TWO) TIMES DAILY. 90 tablet 1   Omega-3 Fatty Acids (FISH OIL) 1200 MG CAPS Take 2 capsules by mouth 3 (three) times daily with meals.     oxyCODONE-acetaminophen (PERCOCET) 5-325 MG tablet Take 1 tablet by mouth every 4 (four) hours as needed for severe pain. 10 tablet 0   rosuvastatin (CRESTOR) 10 MG tablet TAKE 1 TABLET (10 MG TOTAL) BY MOUTH DAILY. 90 tablet 3   No current facility-administered medications for this visit.      PHYSICAL EXAMINATION: ECOG PERFORMANCE STATUS: 1 - Symptomatic but completely ambulatory Vitals:   10/19/18 0902  BP: (!) 107/59  Pulse: 85  Resp: 16  Temp: (!) 97.3 F (36.3 C)   Filed Weights   10/19/18 0902  Weight: 209 lb 3.2 oz (94.9 kg)    Physical Exam Constitutional:      General: He is not in acute distress.    Appearance: He is ill-appearing.  HENT:     Head: Normocephalic and atraumatic.  Eyes:     General: No scleral icterus.    Pupils: Pupils are equal, round, and reactive to light.  Neck:     Musculoskeletal: Normal range of motion and neck supple.  Cardiovascular:     Rate and Rhythm: Normal rate and regular rhythm.     Heart sounds: Normal heart sounds.  Pulmonary:     Effort: Pulmonary effort is normal. No respiratory distress.     Breath sounds: No wheezing.     Comments: Improved breathing sounds left lower lobe.  Still decreased. Abdominal:     General: Bowel sounds are normal. There is no distension.     Palpations: Abdomen is soft. There is no mass.     Tenderness: There is no abdominal tenderness.  Musculoskeletal: Normal range of motion.        General: No deformity.  Skin:    General: Skin is warm and dry.     Findings: No erythema or rash.  Neurological:     Mental Status: He is alert and oriented to person, place, and time.     Cranial Nerves: No cranial nerve deficit.     Coordination: Coordination  normal.  Psychiatric:        Behavior: Behavior normal.        Thought Content: Thought content normal.     LABORATORY DATA:  I have reviewed the data as listed Lab Results  Component Value Date   WBC 6.1 10/19/2018   HGB 10.2 (L) 10/19/2018   HCT 30.5 (L) 10/19/2018   MCV 95.0 10/19/2018   PLT 329 10/19/2018   Recent Labs    08/23/18 1848  08/30/18 1402 09/28/18 1133 10/19/18 0841  NA 137  --  141 139 137  K 3.9  --  4.2 4.4 4.0  CL 103  --  101 100 104  CO2 26  --  30 25 24   GLUCOSE 185*  --  194* 294* 252*  BUN 18  --  20 14 14   CREATININE 0.88  --  0.87 0.78 0.61  CALCIUM 9.1  --  9.7 8.9 8.5*  GFRNONAA >60  --   --  >60 >60  GFRAA >60  --   --  >60 >60  PROT  --    < > 6.8 6.8 6.0*  ALBUMIN  --   --  4.1 3.3* 3.0*  AST  --   --  12 13* 14*  ALT  --   --  8 15 10   ALKPHOS  --   --  66 102 69  BILITOT  --   --  0.6 1.1 0.5   < > = values in this interval not displayed.   Iron/TIBC/Ferritin/ %Sat No results found for: IRON, TIBC, FERRITIN, IRONPCTSAT    RADIOGRAPHIC STUDIES: I have personally reviewed the radiological images as listed and agreed with the findings in the report.  Dg Chest 2 View  Result Date: 10/15/2018 CLINICAL DATA:  Follow-up for a left pleural effusion. EXAM: CHEST - 2 VIEW COMPARISON:  Chest radiograph dated 10/04/2018. FINDINGS: The heart size and mediastinal contours are not significantly changed. A left-sided thoracostomy tube terminates near the medial left lung apex, unchanged. A small a moderate left pleural effusion with associated atelectasis/airspace disease is not significantly changed. The right lung is clear without pleural effusion. There is no pneumothorax. Median sternotomy wires are redemonstrated. Soft tissue gas overlying the left chest and axilla has decreased. IMPRESSION: Unchanged left-sided thoracostomy tube with unchanged left pleural effusion and associated atelectasis/airspace disease. Electronically Signed   By: Zerita Boers M.D.   On: 10/15/2018 14:57   Dg Chest 2 View  Result Date: 10/04/2018 CLINICAL DATA:  Left lung cancer, follow-up chest tube EXAM: CHEST - 2 VIEW COMPARISON:  10/02/2018 FINDINGS: Left-sided hydropneumothorax with left-sided chest tube and slight interval increase in a small left pleural effusion component with associated extensive diffuse interstitial pulmonary opacity and partial atelectasis of the left lung. There remains a small, approximately 10% left pneumothorax, possibly with a trapped left lung. The right lung is normally aerated. Increased subcutaneous emphysema about the left chest wall and neck. Status post median sternotomy and CABG. IMPRESSION: 1. Left-sided hydropneumothorax with left-sided chest tube and slight interval increase in a small left pleural effusion component with associated extensive diffuse interstitial pulmonary opacity and partial atelectasis of the left lung. 2. There remains a small, approximately 10% left pneumothorax, possibly with a trapped left lung. 3. Increased subcutaneous emphysema about the left chest wall and neck. Electronically Signed   By: Eddie Candle M.D.   On: 10/04/2018 13:28   Chest 2 View  Result Date: 09/28/2018 CLINICAL DATA:  63 year old male  under preoperative evaluation prior to scheduled surgery. Pleural effusion. Shortness of breath. EXAM: CHEST - 2 VIEW COMPARISON:  Chest x-ray 09/13/2018.  PET-CT 09/18/2018. FINDINGS: Large left-sided hydropneumothorax again noted, with small pneumothorax component in the apex of the left hemithorax and large pleural fluid component. No aerated left lung is identified, presumably secondary to underlying atelectasis. The right lung is clear. No right pleural effusion. No evidence of pulmonary edema. Cardiac silhouette is largely obscured. Status post median sternotomy for CABG. IMPRESSION: 1. Large left hydropneumothorax with probable complete atelectasis of the left lung. The hydropneumothorax is similar  to recent PET-CT, although the degree of atelectasis in the left lung has increased. Electronically Signed   By: Vinnie Langton M.D.   On: 09/28/2018 16:44   Mr Jeri Cos WC Contrast  Result Date: 09/21/2018 CLINICAL DATA:  Malignant neoplasm of unspecified part of unspecified bronchus or lung. Lung cancer, non-small cell, staging EXAM: MRI HEAD WITHOUT AND WITH CONTRAST TECHNIQUE: Multiplanar, multiecho pulse sequences of the brain and surrounding structures were obtained without and with intravenous contrast. CONTRAST:  13mL GADAVIST GADOBUTROL 1 MMOL/ML IV SOLN COMPARISON:  No pertinent prior studies available for comparison. FINDINGS: Brain: A punctate focus of enhancement within the right parietal lobe cortex is suspicious for possible metastasis (series 18, image 114). No other foci of abnormal intracranial enhancement are identified. There is no acute infarct. No midline shift or extra-axial fluid collection. No chronic intracranial hemorrhage. Mild scattered T2/FLAIR hyperintensity within the cerebral white matter and brainstem is nonspecific, but consistent with chronic small vessel ischemic disease. Mild generalized parenchymal atrophy. Vascular: Flow voids maintained within the proximal large arterial vessels. Skull and upper cervical spine: Normal marrow signal. Sinuses/Orbits: Imaged globes and orbits demonstrate no acute abnormality. Mild mucosal thickening within a small right maxillary sinus. No significant mastoid effusion. IMPRESSION: Punctate focus of enhancement within the right parietal lobe cortex suspicious for possible metastasis. Three-month contrast-enhanced MRI follow-up is recommended. Mild generalized parenchymal atrophy and chronic small vessel ischemic disease. Right maxillary sinus disease as described. Electronically Signed   By: Kellie Simmering   On: 09/21/2018 10:00   Dg Chest Port 1 View  Result Date: 10/02/2018 CLINICAL DATA:  Postop check EXAM: PORTABLE CHEST 1 VIEW  COMPARISON:  Yesterday FINDINGS: Tracheal extubation. A left chest tube remains. Moderate left pneumothorax seen at the base more than apex. Small volume pleural fluid at the left base, diminished. The right lung remains clear. Normal heart size. CABG. IMPRESSION: Moderate left pneumothorax after pleural fluid evacuation. Electronically Signed   By: Monte Fantasia M.D.   On: 10/02/2018 09:29   Dg Chest Port 1 View  Result Date: 10/01/2018 CLINICAL DATA:  Postop left-sided catheter placement for malignant pleural effusion. EXAM: PORTABLE CHEST 1 VIEW COMPARISON:  09/28/2018 FINDINGS: There is a new left-sided tunneled chest tube. One of the sideholes may be straddling the chest wall. There is a large left-sided pleural effusion. The heart size is likely stable but evaluation is limited by the presence of a large pleural effusion. The patient is status post prior median sternotomy. There is no clear pneumothorax. No acute osseous abnormality. IMPRESSION: 1. Interval placement of a tunneled left-sided PleurX catheter. The last side hole may be straddling the chest wall. This can be further evaluated with oblique imaging. 2. Large left-sided pleural effusion.  No pneumothorax. Electronically Signed   By: Constance Holster M.D.   On: 10/01/2018 09:09      ASSESSMENT & PLAN:  1. lung adenocarcinoma  2. Recurrent pleural effusion on left   3. Goals of care, counseling/discussion   4. Encounter for antineoplastic immunotherapy    #Stage IV metastatic lung adenocarcinoma TxNx M1 Malignant pleural effusion, M1 disease. 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 I recommended patient to start chemotherapy immunotherapy carboplatin Alimta Keytruda. Patient declines chemotherapy but interested in immunotherapy. Labs are reviewed and discussed with patient.  His blood counts are stable.  Acceptable to proceed with cycle 1 Keytruda. NGS results pending.  #Recurrent pleural effusion, status post Pleurx catheter.   Patient is doing his own dressing changes.  #CNS metastasis, recommend patient establish care with radiation oncology Dr. Baruch Gouty.  Patient declined radiation  # Smoke cessation discussed with patient. Continue follow-up with with palliative care Supportive care measures are necessary for patient well-being and will be provided as necessary. We spent sufficient time to discuss many aspect of care, questions were answered to patient's satisfaction.  Orders Placed This Encounter  Procedures   TSH    Standing Status:   Future    Number of Occurrences:   1    Standing Expiration Date:   10/19/2019    All questions were answered. The patient knows to call the clinic with any problems questions or concerns.   Return of visit:  3 weeks. Earlie Server, MD, PhD Hematology Oncology [facility] Pager- [phone] 10/19/2018

## 2018-10-19 NOTE — Telephone Encounter (Signed)
Patient called and states that he thought he was to have a couple of prescriptions to take with his chemotherapy he got today and that he has some questions also.Please advise

## 2018-10-19 NOTE — Telephone Encounter (Signed)
Per Dr, She does not know of any prescriptions that need to be sent in. I also called patient to inform him of this and answer any questions he has, but I got his voice mail and had to leave a message regarding prescription and to call back Monday with his questions or to call On call doctor for any pressing problems he may have

## 2018-10-19 NOTE — Progress Notes (Signed)
Patient does not offer any problems today.  

## 2018-10-19 NOTE — Telephone Encounter (Signed)
Noted. Thanks.

## 2018-10-19 NOTE — Telephone Encounter (Signed)
Recruitment consultant rep and cancelled order. Advised patient to dispose of the kit.

## 2018-10-19 NOTE — Telephone Encounter (Signed)
Spoke with patient. He would like to post pone on Cologuard test at this time. I will look and see how I can cancel order in [employer] system. thanks

## 2018-10-22 ENCOUNTER — Other Ambulatory Visit: Payer: Medicare Other

## 2018-10-22 ENCOUNTER — Ambulatory Visit: Payer: Medicare Other

## 2018-10-22 ENCOUNTER — Telehealth: Payer: Self-pay

## 2018-10-22 ENCOUNTER — Ambulatory Visit: Payer: Medicare Other | Admitting: Oncology

## 2018-10-22 NOTE — Telephone Encounter (Signed)
Telephone call to patient for follow up after first infusion received on Friday.  Patient states it went well and feels no different.  States eating and drinking fluids well.  Questioned if he can drink "Premiere" protein shakes and told him yes.  Encoruaged patient to call for any questions or concerns.

## 2018-10-22 NOTE — Progress Notes (Signed)
  Oncology Nurse Navigator Documentation  Navigator Location: [facility] (10/19/18 1445)   )Navigator Encounter Type: Treatment (10/19/18 1445)                   Treatment Initiated Date: 10/19/18 (10/19/18 1445)   Treatment Phase: First Chemo Tx (10/19/18 1445) Barriers/Navigation Needs: No Barriers At This Time (10/19/18 1445)   Interventions: None Required (10/19/18 1445)                      Time Spent with Patient: 15 (10/19/18 1445)

## 2018-10-29 ENCOUNTER — Telehealth: Payer: Self-pay | Admitting: Cardiothoracic Surgery

## 2018-10-29 DIAGNOSIS — J910 Malignant pleural effusion: Secondary | ICD-10-CM | POA: Diagnosis not present

## 2018-10-29 DIAGNOSIS — C3490 Malignant neoplasm of unspecified part of unspecified bronchus or lung: Secondary | ICD-10-CM | POA: Diagnosis not present

## 2018-10-29 NOTE — Telephone Encounter (Signed)
Spoke with patient. He states he drained very little from his pleurx catheter. Continuing to drain everyday and is asking if he should skip a day and the try again to avoid wasting bottles. He drained Friday around 400 ml. Saturday- few drops and some foam.  Per Dr.Oaks do not drain until Friday 11/02/2018. at appointment unless patient feels the need to do so prior to Fridays appointment.

## 2018-10-29 NOTE — Telephone Encounter (Signed)
Patient is calling about his drain he doesn't have anything coming out. Please call patient and advise

## 2018-11-01 ENCOUNTER — Telehealth: Payer: Self-pay | Admitting: *Deleted

## 2018-11-01 ENCOUNTER — Other Ambulatory Visit: Payer: Self-pay

## 2018-11-01 ENCOUNTER — Other Ambulatory Visit (INDEPENDENT_AMBULATORY_CARE_PROVIDER_SITE_OTHER): Payer: Medicare Other

## 2018-11-01 DIAGNOSIS — E119 Type 2 diabetes mellitus without complications: Secondary | ICD-10-CM | POA: Diagnosis not present

## 2018-11-01 DIAGNOSIS — Z125 Encounter for screening for malignant neoplasm of prostate: Secondary | ICD-10-CM

## 2018-11-01 LAB — HEMOGLOBIN A1C: Hgb A1c MFr Bld: 7.6 % — ABNORMAL HIGH (ref 4.6–6.5)

## 2018-11-01 NOTE — Progress Notes (Signed)
Patient is coming in for follow up, he states he has a cough and noted tinge of blood but no fevers. Call patient wife if he needs to cancel appointment

## 2018-11-01 NOTE — Telephone Encounter (Signed)
Would you please ask if he would like to come to see symptomatic clinic this afternoon or tmr see me. I have admin afternoon today.

## 2018-11-01 NOTE — Telephone Encounter (Signed)
Patient called reporting that he has been coughing since his treatment and there is now blood streaks in his sputum and he would like to know what to do about it. Please Arnoldo Hooker

## 2018-11-01 NOTE — Telephone Encounter (Signed)
Pt scheduled to see Dr. Tasia Catchings on 10/30.

## 2018-11-02 ENCOUNTER — Ambulatory Visit
Admission: RE | Admit: 2018-11-02 | Discharge: 2018-11-02 | Disposition: A | Discharge status: Home / Self Care | Payer: Medicare Other | Source: Ambulatory Visit | Attending: Cardiothoracic Surgery | Admitting: Cardiothoracic Surgery

## 2018-11-02 ENCOUNTER — Inpatient Hospital Stay: Payer: Medicare Other | Admitting: Oncology

## 2018-11-02 ENCOUNTER — Encounter: Payer: Self-pay | Admitting: Oncology

## 2018-11-02 ENCOUNTER — Ambulatory Visit
Admission: RE | Admit: 2018-11-02 | Discharge: 2018-11-02 | Disposition: A | Discharge status: Home / Self Care | Payer: Medicare Other | Source: Ambulatory Visit | Attending: Oncology | Admitting: Oncology

## 2018-11-02 ENCOUNTER — Other Ambulatory Visit: Payer: Self-pay

## 2018-11-02 ENCOUNTER — Encounter: Payer: Self-pay | Admitting: Cardiothoracic Surgery

## 2018-11-02 ENCOUNTER — Inpatient Hospital Stay: Payer: Medicare Other

## 2018-11-02 ENCOUNTER — Ambulatory Visit (INDEPENDENT_AMBULATORY_CARE_PROVIDER_SITE_OTHER): Payer: Medicare Other | Admitting: Cardiothoracic Surgery

## 2018-11-02 VITALS — BP 144/69 | HR 111 | Temp 97.7°F | Resp 24 | Wt 201.3 lb

## 2018-11-02 VITALS — BP 139/82 | HR 114 | Temp 95.0°F | Resp 12 | Ht 72.0 in | Wt 200.6 lb

## 2018-11-02 DIAGNOSIS — R059 Cough, unspecified: Secondary | ICD-10-CM

## 2018-11-02 DIAGNOSIS — C3490 Malignant neoplasm of unspecified part of unspecified bronchus or lung: Secondary | ICD-10-CM

## 2018-11-02 DIAGNOSIS — J90 Pleural effusion, not elsewhere classified: Secondary | ICD-10-CM | POA: Diagnosis not present

## 2018-11-02 DIAGNOSIS — R05 Cough: Secondary | ICD-10-CM | POA: Diagnosis not present

## 2018-11-02 DIAGNOSIS — R000 Tachycardia, unspecified: Secondary | ICD-10-CM | POA: Diagnosis not present

## 2018-11-02 DIAGNOSIS — R0602 Shortness of breath: Secondary | ICD-10-CM

## 2018-11-02 DIAGNOSIS — R042 Hemoptysis: Secondary | ICD-10-CM

## 2018-11-02 DIAGNOSIS — I700 Atherosclerosis of aorta: Secondary | ICD-10-CM | POA: Diagnosis not present

## 2018-11-02 DIAGNOSIS — Z5112 Encounter for antineoplastic immunotherapy: Secondary | ICD-10-CM | POA: Diagnosis not present

## 2018-11-02 DIAGNOSIS — Z85118 Personal history of other malignant neoplasm of bronchus and lung: Secondary | ICD-10-CM | POA: Diagnosis not present

## 2018-11-02 DIAGNOSIS — Z7189 Other specified counseling: Secondary | ICD-10-CM

## 2018-11-02 LAB — COMPREHENSIVE METABOLIC PANEL
ALT: 10 U/L (ref 0–44)
AST: 11 U/L — ABNORMAL LOW (ref 15–41)
Albumin: 2.6 g/dL — ABNORMAL LOW (ref 3.5–5.0)
Alkaline Phosphatase: 75 U/L (ref 38–126)
Anion gap: 15 (ref 5–15)
BUN: 15 mg/dL (ref 8–23)
CO2: 22 mmol/L (ref 22–32)
Calcium: 8.5 mg/dL — ABNORMAL LOW (ref 8.9–10.3)
Chloride: 95 mmol/L — ABNORMAL LOW (ref 98–111)
Creatinine, Ser: 0.84 mg/dL (ref 0.61–1.24)
GFR calc Af Amer: >60 mL/min (ref >60)
GFR calc non Af Amer: >60 mL/min (ref >60)
Glucose, Bld: 425 mg/dL — ABNORMAL HIGH (ref 70–99)
Potassium: 4 mmol/L (ref 3.5–5.1)
Sodium: 132 mmol/L — ABNORMAL LOW (ref 135–145)
Total Bilirubin: 0.9 mg/dL (ref 0.3–1.2)
Total Protein: 7 g/dL (ref 6.5–8.1)

## 2018-11-02 LAB — CBC WITH DIFFERENTIAL/PLATELET
Abs Immature Granulocytes: 0.18 10*3/uL — ABNORMAL HIGH (ref 0.00–0.07)
Basophils Absolute: 0.1 10*3/uL (ref 0.0–0.1)
Basophils Relative: 1 %
Eosinophils Absolute: 0.2 10*3/uL (ref 0.0–0.5)
Eosinophils Relative: 1 %
HCT: 33.9 % — ABNORMAL LOW (ref 39.0–52.0)
Hemoglobin: 11.2 g/dL — ABNORMAL LOW (ref 13.0–17.0)
Immature Granulocytes: 1 %
Lymphocytes Relative: 4 %
Lymphs Abs: 0.6 10*3/uL — ABNORMAL LOW (ref 0.7–4.0)
MCH: 30.3 pg (ref 26.0–34.0)
MCHC: 33 g/dL (ref 30.0–36.0)
MCV: 91.6 fL (ref 80.0–100.0)
Monocytes Absolute: 1.7 10*3/uL — ABNORMAL HIGH (ref 0.1–1.0)
Monocytes Relative: 10 %
Neutro Abs: 13.3 10*3/uL — ABNORMAL HIGH (ref 1.7–7.7)
Neutrophils Relative %: 83 %
Platelets: 498 10*3/uL — ABNORMAL HIGH (ref 150–400)
RBC: 3.7 MIL/uL — ABNORMAL LOW (ref 4.22–5.81)
RDW: 12.9 % (ref 11.5–15.5)
WBC: 16.1 10*3/uL — ABNORMAL HIGH (ref 4.0–10.5)
nRBC: 0 % (ref 0.0–0.2)

## 2018-11-02 LAB — PSA, MEDICARE: PSA: 0.27 ng/ml (ref 0.10–4.00)

## 2018-11-02 MED ORDER — LEVOFLOXACIN 750 MG PO TABS: 750.0000 mg | ORAL_TABLET | Freq: Every day | ORAL | 0 refills | Status: DC

## 2018-11-02 MED ORDER — IOHEXOL 350 MG/ML SOLN
75.0000 mL | Freq: Once | INTRAVENOUS | Status: AC | PRN
  Administered 2018-11-02: 75 mL via INTRAVENOUS

## 2018-11-02 NOTE — Patient Instructions (Addendum)
Please have the chest xray prior to seeing Dr.Oaks. Please drain once weekly.

## 2018-11-02 NOTE — Progress Notes (Signed)
Patient reports having blood tinged sputum for the past 2 days.  Feels he has an increase in SOBr.  Reports a decrease in appetite and has a 9 lb wt loss in 2 weeks.

## 2018-11-02 NOTE — Progress Notes (Signed)
  Patient ID: Craig Koch, male   DOB: [birth year], 63 y.o.   MRN: [id number]  HISTORY: He returns today in follow-up.  His wife called stating that she was unable to drain anything from his Pleurx catheter.  It has been now over a week since they were able to drain anything from it.  I have asked the patient to have a chest x-ray today which I have independently reviewed.  That chest x-ray shows some opacity scattered throughout the left hemithorax but no obvious large pleural effusion.  We again attempted to drain the catheter today.  We were unable to get anything from it.  The patient states that he is getting somewhat more short of breath.  He denied any fevers.   Vitals:   11/02/18 0955  BP: 139/82  Pulse: (!) 114  Resp: 12  Temp: (!) 95 F (35 C)  SpO2: 93%     EXAM:    Resp: Lungs are clear on the right and diminished on the left.  No respiratory distress, normal effort. Heart:  Regular without murmurs Abd:  Abdomen is soft, non distended and non tender. No masses are palpable.  There is no rebound and no guarding.  Neurological: Alert and oriented to person, place, and time. Coordination normal.  Skin: Skin is warm and dry. No rash noted. No diaphoretic. No erythema. No pallor.  Psychiatric: Normal mood and affect. Normal behavior. Judgment and thought content normal.    ASSESSMENT: I have independently reviewed his chest x-ray.  There are some opacities scattered throughout the left hemithorax.  There may be a small pleural effusion but this is certainly much less than before.   PLAN:   I told the patient that I would like to have him come back in 1 week with another chest x-ray and if that x-ray does not show any change we will remove the catheter here in our office.  He understands and would like to proceed with that.    [name], MD

## 2018-11-04 ENCOUNTER — Telehealth: Payer: Self-pay | Admitting: Family Medicine

## 2018-11-04 NOTE — Telephone Encounter (Signed)
Called and LMOVM that I wouldn't be at work tomorrow due to an illness.  I wasn't able to talk to patient directly.

## 2018-11-04 NOTE — Progress Notes (Signed)
Hematology/Oncologyfollow up  note [facility] [phone] [phone]   Patient Care Team: Tonia Ghent, MD as PCP - General (Family Medicine) Thelma Comp, [place] (Optometry) Telford Nab, RN as Registered Nurse  REFERRING PROVIDER: Tonia Ghent, MD  CHIEF COMPLAINTS/REASON FOR VISIT:  Follow-up for metastatic lung adenocarcinoma  HISTORY OF PRESENTING ILLNESS:   Craig Koch is a  63 y.o.  male with PMH listed below was seen in consultation at the request of  Tonia Ghent, MD  for evaluation of malignant pleural effusion. Patient was recently admitted to [facility] due to large amount of pleural effusion, acute hypoxic respiratory failure. Patient reports feeling shortness of breath and dyspnea on exertion for several weeks and presented to emergency room.  A chest x-ray 08/23/2018 showed a large amount of left pleural effusion 08/24/2018 CT chest abdomen pelvis confirmed large left pleural effusion with near complete collapse of the left lower lobe and extensive volume loss in the lingula and a portion of the left upper lobe. No acute intra-abdominal process.  Cholelithiasis, diverticulitis, coronary atherosclerosis post CABG.  Bilateral renal cortical scarring.  Aortic atherosclerosis.  #08/24/2018 ultrasound guided diagnostic and therapeutic thoracentesis removed 1.8 L of pleural fluid. Patient subsequently felt better and was discharged to follow-up with pulmonology. 09/04/2018 patient followed up with Dr.Aleskerov chest x-ray showed recurrent moderate left pleural effusion.  Patient underwent ultrasound-guided left thoracentesis again and it drained 3 L of fluid. Pleural fluid cytology positive for adenocarcinoma. Patient was referred to me for further evaluation and management.  # PET eft sided pleural hypermetabolic activity  consistent with malignant pleural effusion. Left upper and lower lobe low-level hypermetabolic him  corresponding to areas of airspace and groundglass opacity.  This was felt to be secondary to atelectasis.  Underlying left upper lobe primary bronchogenic carcinoma cannot be excluded. Medial left upper lobe area more focal hypermetabolic area could represent a pleural disease or a site of small left upper lobe primary. No evidence of extra thoracic hypermetabolic metastatic disease.  Left sided hydro-pneumothorax within the pleural air component being new since the prior CT. Cholelithiasis.  #MRI brain showed punctate focus of enhancement within the right parietal lobe cortex suspicious for possible metastasis.   INTERVAL HISTORY Craig Koch is a 63 y.o. 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  Problems and complaints are listed below: Patient is status post 1 cycle of immunotherapy. He declines chemotherapy. Pleurx placed by Dr. Genevive Bi on 10/01/2018. Patient reports not draining much fluid these days. He has had developed increased shortness of breath, cough, productive with blood stained sputum.  Fatigue: reports worsening fatigue. Chronic onset, perisistent, no aggravating or improving factors, no associated symptoms.      Review of Systems  Constitutional: Positive for appetite change and fatigue. Negative for chills, fever and unexpected weight change.  HENT:   Negative for hearing loss and voice change.   Eyes: Negative for eye problems and icterus.  Respiratory: Positive for cough and shortness of breath. Negative for chest tightness.   Cardiovascular: Negative for chest pain and leg swelling.  Gastrointestinal: Negative for abdominal distention and abdominal pain.  Endocrine: Negative for hot flashes.  Genitourinary: Negative for difficulty urinating, dysuria and frequency.   Musculoskeletal: Negative for arthralgias.  Skin: Negative for itching and rash.  Neurological:  Negative for light-headedness and numbness.  Hematological: Negative for adenopathy. Does not bruise/bleed  easily.  Psychiatric/Behavioral: Negative for confusion.    MEDICAL HISTORY:  Past Medical History:  Diagnosis Date  . Anxiety   . Arthritis   . Cancer ([place])    lung  . Coronary artery disease   . Diabetes mellitus   . Dyslipidemia   . Hx of CABG   . Hyperlipidemia   . Hypertension   . Pleural effusion     SURGICAL HISTORY: Past Surgical History:  Procedure Laterality Date  . CATARACT EXTRACTION Left 09/2015  . CHEST TUBE INSERTION Left 10/01/2018   Procedure: INSERTION PLEURAL DRAINAGE CATHETER;  Surgeon: [name], MD;  Location: [facility] ORS;  Service: Thoracic;  Laterality: Left;  . CORONARY ARTERY BYPASS GRAFT  2004   (CABG with LIMA to the  LAD, SVG to OM2/OM3, SVG  to diag  . Left ankle surgery     repair of fracture  . Right lower leg surgery     rod    SOCIAL HISTORY: Social History   Socioeconomic History  . Marital status: Married    Spouse name: Not on file  . Number of children: Not on file  . Years of education: Not on file  . Highest education level: Not on file  Occupational History  . Not on file  Social Needs  . Financial resource strain: Not on file  . Food insecurity    Worry: Not on file    Inability: Not on file  . Transportation needs    Medical: Not on file    Non-medical: Not on file  Tobacco Use  . Smoking status: Current Every Day Smoker    Packs/day: 0.25    Years: 45.00    Pack years: 11.25    Types: Cigarettes  . Smokeless tobacco: Former Systems developer    Types: Snuff  Substance and Sexual Activity  . Alcohol use: Yes    Alcohol/week: 6.0 standard drinks    Types: 6 Cans of beer per week    Comment: occ, average 6 pack in a week  . Drug use: No  . Sexual activity: Never  Lifestyle  . Physical activity    Days per week: Not on file    Minutes per session: Not on file  . Stress: Not on file  Relationships  . Social  Herbalist on phone: Not on file    Gets together: Not on file    Attends religious service: Not on file    Active member of club or organization: Not on file    Attends meetings of clubs or organizations: Not on file    Relationship status: Not on file  . Intimate partner violence    Fear of current or ex partner: Not on file    Emotionally abused: Not on file    Physically abused: Not on file    Forced sexual activity: Not on file  Other Topics Concern  . Not on file  Social History Narrative   On disability 2009 after prev injuries and CAD.     Married 1976   2 kids, 4 grandkids.     FAMILY HISTORY: Family History  Problem Relation Age of Onset  . Dementia Mother   . Heart disease Father   . Colon cancer Neg Hx   . Prostate cancer Neg Hx   . Diabetes Neg Hx     ALLERGIES:  is allergic to lipitor [atorvastatin calcium].  MEDICATIONS:  Current Outpatient Medications  Medication Sig Dispense Refill  . albuterol (VENTOLIN HFA)  108 (90 Base) MCG/ACT inhaler Inhale 2 puffs into the lungs every 6 (six) hours as needed for wheezing or shortness of breath. 18 g 0  . BD INSULIN SYRINGE U/F 31G X 5/16" 0.5 ML MISC USE DAILY AS DIRECTED. DX E11.9 100 each 1  . DULoxetine (CYMBALTA) 30 MG capsule Take 1 capsule (30 mg total) by mouth daily.    [name] Fexofenadine HCl (ALLEGRA ALLERGY PO) Take 1 tablet by mouth daily as needed.    [name] glucose blood (ONE TOUCH ULTRA TEST) test strip USE TO TEST GLUCOSE LEVELS 4 (FOUR) TIMES DAILY. 400 each 3  . ibuprofen (ADVIL,MOTRIN) 200 MG tablet Take 400 mg by mouth every 6 (six) hours as needed.     . insulin detemir (LEVEMIR) 100 UNIT/ML injection Inject 0.3 mLs (30 Units total) into the skin at bedtime. 10 mL 5  . insulin lispro (HUMALOG) 100 UNIT/ML injection Inject 0.15 mLs (15 Units total) into the skin 3 (three) times daily with meals.    [name] lisinopril (ZESTRIL) 2.5 MG tablet TAKE 1 TABLET BY MOUTH DAILY 90 tablet 2  . metFORMIN  (GLUCOPHAGE) 500 MG tablet TAKE 1 TABLET (500 MG TOTAL) BY MOUTH 2 (TWO) TIMES DAILY WITH A MEAL. 180 tablet 1  . metoprolol tartrate (LOPRESSOR) 100 MG tablet TAKE 0.5 TABLETS (50 MG TOTAL) BY MOUTH 2 (TWO) TIMES DAILY. 90 tablet 1  . Omega-3 Fatty Acids (FISH OIL) 1200 MG CAPS Take 2 capsules by mouth 3 (three) times daily with meals.    . rosuvastatin (CRESTOR) 10 MG tablet TAKE 1 TABLET (10 MG TOTAL) BY MOUTH DAILY. 90 tablet 3  . aspirin 325 MG tablet Take 325 mg by mouth daily.    [name] levofloxacin (LEVAQUIN) 750 MG tablet Take 1 tablet (750 mg total) by mouth daily. 7 tablet 0   No current facility-administered medications for this visit.      PHYSICAL EXAMINATION: ECOG PERFORMANCE STATUS: 1 - Symptomatic but completely ambulatory Vitals:   11/02/18 1138  BP: (!) 144/69  Pulse: (!) 111  Resp: (!) 24  Temp: 97.7 F (36.5 C)  SpO2: 94%   Filed Weights   11/02/18 1138  Weight: 201 lb 4.8 oz (91.3 kg)    Physical Exam Constitutional:      General: He is not in acute distress.    Appearance: He is ill-appearing.     Comments: Cachectic  HENT:     Head: Normocephalic and atraumatic.  Eyes:     General: No scleral icterus.    Pupils: Pupils are equal, round, and reactive to light.  Neck:     Musculoskeletal: Normal range of motion and neck supple.  Cardiovascular:     Rate and Rhythm: Regular rhythm. Tachycardia present.     Heart sounds: Normal heart sounds.  Pulmonary:     Effort: Pulmonary effort is normal. No respiratory distress.     Breath sounds: No wheezing.     Comments: Decreased breathing sounds left lower lobe.   Abdominal:     General: Bowel sounds are normal. There is no distension.     Palpations: Abdomen is soft. There is no mass.     Tenderness: There is no abdominal tenderness.  Musculoskeletal: Normal range of motion.        General: No deformity.  Skin:    General: Skin is warm and dry.     Findings: No erythema or rash.  Neurological:      Mental Status: He is alert and oriented to  person, place, and time.     Cranial Nerves: No cranial nerve deficit.     Coordination: Coordination normal.  Psychiatric:        Behavior: Behavior normal.        Thought Content: Thought content normal.     LABORATORY DATA:  I have reviewed the data as listed Lab Results  Component Value Date   WBC 16.1 (H) 11/02/2018   HGB 11.2 (L) 11/02/2018   HCT 33.9 (L) 11/02/2018   MCV 91.6 11/02/2018   PLT 498 (H) 11/02/2018   Recent Labs    09/28/18 1133 10/19/18 0841 11/02/18 1238  NA 139 137 132*  K 4.4 4.0 4.0  CL 100 104 95*  CO2 25 24 22   GLUCOSE 294* 252* 425*  BUN 14 14 15   CREATININE 0.78 0.61 0.84  CALCIUM 8.9 8.5* 8.5*  GFRNONAA >60 >60 >60  GFRAA >60 >60 >60  PROT 6.8 6.0* 7.0  ALBUMIN 3.3* 3.0* 2.6*  AST 13* 14* 11*  ALT 15 10 10   ALKPHOS 102 69 75  BILITOT 1.1 0.5 0.9   Iron/TIBC/Ferritin/ %Sat No results found for: IRON, TIBC, FERRITIN, IRONPCTSAT    RADIOGRAPHIC STUDIES: I have personally reviewed the radiological images as listed and agreed with the findings in the report.  Dg Chest 2 View  Result Date: 11/02/2018 CLINICAL DATA:  Pleural effusion with pleural catheter. EXAM: CHEST - 2 VIEW COMPARISON:  10/15/2018 FINDINGS: Evidence of patient's PleurX catheter over the left base. Stable to slightly worse moderate size left pleural effusion likely with associated basilar atelectasis. Patchy opacification over the left lung which may be due to atelectasis or infection. Right lung is clear. Cardiomediastinal silhouette and remainder of the exam is unchanged. IMPRESSION: 1. Stable to slightly worse moderate size left pleural effusion likely with associated basilar atelectasis. PleurX drainage catheter present over the left base. 2. Slight worsening hazy patchy opacification over the left lung which may be due to atelectasis or infection. Electronically Signed   By: Marin Olp M.D.   On: 11/02/2018 09:23   Dg  Chest 2 View  Result Date: 10/15/2018 CLINICAL DATA:  Follow-up for a left pleural effusion. EXAM: CHEST - 2 VIEW COMPARISON:  Chest radiograph dated 10/04/2018. FINDINGS: The heart size and mediastinal contours are not significantly changed. A left-sided thoracostomy tube terminates near the medial left lung apex, unchanged. A small a moderate left pleural effusion with associated atelectasis/airspace disease is not significantly changed. The right lung is clear without pleural effusion. There is no pneumothorax. Median sternotomy wires are redemonstrated. Soft tissue gas overlying the left chest and axilla has decreased. IMPRESSION: Unchanged left-sided thoracostomy tube with unchanged left pleural effusion and associated atelectasis/airspace disease. Electronically Signed   By: Zerita Boers M.D.   On: 10/15/2018 14:57   Ct Angio Chest Pe W Or Wo Contrast  Result Date: 11/02/2018 CLINICAL DATA:  History of Lung cancer.  Rule out pulmonary embolus. EXAM: CT ANGIOGRAPHY CHEST WITH CONTRAST TECHNIQUE: Multidetector CT imaging of the chest was performed using the standard protocol during bolus administration of intravenous contrast. Multiplanar CT image reconstructions and MIPs were obtained to evaluate the vascular anatomy. CONTRAST:  37mL OMNIPAQUE IOHEXOL 350 MG/ML SOLN COMPARISON:  PET-CT from 09/18/2018 FINDINGS: Cardiovascular: Normal heart size. Mild pericardial thickening identified. Aortic atherosclerosis. Previous CABG. Main pulmonary artery appears patent. No saddle embolus. No central obstructing pulmonary artery filling defects noted. No lobar or segmental pulmonary artery filling defects identified. Mediastinum/Nodes: The trachea appears patent and is  midline. Normal appearance of the esophagus. No mediastinal, axillary adenopathy. New left supraclavicular lymph node measures 0.9 cm, image 19/5. Lungs/Pleura: Moderate changes of centrilobular emphysema. Small right pleural effusion is new from  previous exam. Interval placement of left-sided chest with decrease in volume of previous large loculated left pleural effusion. Unfortunately, there is been significant interval increase in pleural thickening with enhancement overlying the entire left lung concerning for pleural spread of tumor. Pleural based tumor along the paramediastinal left upper lobe has a thickness of 2.9 cm, image 145/6. With 0.8 cm previously. There is new diffuse interstitial thickening throughout the left lung concerning for lymphangitic spread of disease. Right lung appears hyperinflated but relatively clear. Upper Abdomen: Stones identified within the gallbladder. No acute abnormality. Musculoskeletal: No chest wall abnormality. No acute or significant osseous findings. Review of the MIP images confirms the above findings. IMPRESSION: 1. No evidence for acute pulmonary embolus. 2. Interval placement of left-sided chest tube with significant decrease in volume of loculated left pleural effusion. Unfortunately, there is been marked increase in tumor rind encasing the entire left lung concerning for progression of disease. There is also been new diffuse increased interstitial thickening throughout the left lung worrisome for lymphangitic spread of disease. Aortic Atherosclerosis (ICD10-I70.0) and Emphysema (ICD10-J43.9). Electronically Signed   By: Kerby Moors M.D.   On: 11/02/2018 14:16      ASSESSMENT & PLAN:  1. lung adenocarcinoma   2. Shortness of breath   3. Tachycardia   4. Hemoptysis   5. Cough    #Stage IV metastatic lung adenocarcinoma TxNx M1 Malignant pleural effusion, M1 disease. 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 Patient declines chemotherapy. Currently he is on Keytruda, status post 1 cycle. Patient has reported feeling more shortness of breath, productive cough with blood-tinged sputum Is also tachycardiac in the clinic. Differential includes pneumonia, worsening of cancer, pulmonary embolism And CBC  CMP, blood culture x2. Patient was seen by Dr. Genevive Bi earlier this morning and had chest x-ray done. X-ray was independent reviewed by me.  Increase left lobe opacities. I would obtain stat CT chest PE protocol   #CT images were reviewed and I called patient's wife and updated her. No evidence of acute pulmonary embolus. Left-sided chest tube with significant decrease in volume of loculated left pleural effusion. Unfortunately there has been marked increase in tumor rind encasing the entire left lung concerning for progressive disease. New diffuse increased interstitial thickening throughout the left lung worrisome for lymphangitic spread of the disease.  I had a lengthy discussion with wife over the phone.  Concerning radiographic findings were discussed.  Recommend patient to risk consider chemotherapy to achieve faster response to decrease tumor load. Patient is reluctant and wife plans to rediscuss with patient update me next week. If he continues to decline, he may not be a candidate for chemotherapy.  And will recommend hospice. Patient's dyspnea, he is not hypoxia.  He is not willing to go to the hospital for supportive care. He does have an increased white count of 16.1.  I empirically treated him with Levaquin 750 mg daily for 7 days.  Orders Placed This Encounter  Procedures  . Culture, blood (routine x 2)    Standing Status:   Future    Number of Occurrences:   1    Standing Expiration Date:   11/02/2019  . Culture, blood (routine x 2)    Standing Status:   Future    Number of Occurrences:   1  Standing Expiration Date:   11/02/2019  . CT ANGIO CHEST PE W OR WO CONTRAST    Standing Status:   Future    Number of Occurrences:   1    Standing Expiration Date:   02/02/2020    Order Specific Question:   ** REASON FOR EXAM (FREE TEXT)    Answer:   lung cancer, pleural effusion, SOB, cough, rule out PE    Order Specific Question:   If indicated for the ordered procedure, I  authorize the administration of contrast media per Radiology protocol    Answer:   Yes    Order Specific Question:   Preferred imaging location?    Answer:   [facility]    Order Specific Question:   Call Results- Best Contact Number?    Answer:   [phone] Do Not Hold    Order Specific Question:   Radiology Contrast Protocol - do NOT remove file path    Answer:   [email or web address]    All questions were answered. The patient knows to call the clinic with any problems questions or concerns.   Return of visit: Follow-up as planned. Earlie Server, MD, PhD Hematology Oncology [facility] [place]- [phone] 11/04/2018 Sputum.

## 2018-11-05 ENCOUNTER — Telehealth: Payer: Self-pay

## 2018-11-05 ENCOUNTER — Ambulatory Visit: Payer: Medicare Other | Admitting: Family Medicine

## 2018-11-05 NOTE — Telephone Encounter (Signed)
-----   Message from Earlie Server, MD sent at 11/04/2018  9:38 PM EST ----- Please schedule him to see palliative care this week.  Keep appt with me. Thank you

## 2018-11-05 NOTE — Telephone Encounter (Signed)
Pt rescheduled to 11/08/18 @ 8am

## 2018-11-05 NOTE — Progress Notes (Signed)
Done Merrily Pew can see him in INF[facility]ION the same day.[name]

## 2018-11-05 NOTE — Telephone Encounter (Signed)
Added to palliative schedule on 11/09/2018.

## 2018-11-07 LAB — CULTURE, BLOOD (ROUTINE X 2)
Culture: NO GROWTH
Culture: NO GROWTH
Special Requests: ADEQUATE
Special Requests: ADEQUATE

## 2018-11-08 ENCOUNTER — Other Ambulatory Visit: Payer: Self-pay

## 2018-11-08 ENCOUNTER — Ambulatory Visit (INDEPENDENT_AMBULATORY_CARE_PROVIDER_SITE_OTHER): Payer: Medicare Other | Admitting: Family Medicine

## 2018-11-08 ENCOUNTER — Encounter: Payer: Self-pay | Admitting: Oncology

## 2018-11-08 DIAGNOSIS — E118 Type 2 diabetes mellitus with unspecified complications: Secondary | ICD-10-CM

## 2018-11-08 DIAGNOSIS — C3490 Malignant neoplasm of unspecified part of unspecified bronchus or lung: Secondary | ICD-10-CM

## 2018-11-08 NOTE — Progress Notes (Signed)
Interactive audio and video telecommunications were attempted between this provider and patient, however failed, due to patient having technical difficulties OR patient did not have access to video capability.  We continued and completed visit with audio only.   Virtual Visit via Telephone Note  I connected with patient on 11/08/18  at 8:12 AM  by telephone and verified that I am speaking with the correct person using two identifiers.  Location of patient: home.   Location of MD: [facility] Name of referring provider (if blank then none associated): Names per persons and role in encounter:  MD: Earlyne Iba, Patient: name listed above.    I discussed the limitations, risks, security and privacy concerns of performing an evaluation and management service by telephone and the availability of in person appointments. I also discussed with the patient that there may be a patient responsible charge related to this service. The patient expressed understanding and agreed to proceed.  CC F/u.   History of Present Illness:   Diabetes:  Using medications without difficulties:yes Hypoglycemic episodes:no Hyperglycemic episodes:no Feet problems:no Blood Sugars averaging: usually ~180 prior to meals with some variation with meal intake.   eye exam within last year: due, dw pt.   A1c improved to 7.6.    PSA wnl.   Lung cancer d/w pt. On abx per onc.    Prev CT with 1. No evidence for acute pulmonary embolus. 2. Interval placement of left-sided chest tube with significant decrease in volume of loculated left pleural effusion. Unfortunately, there is been marked increase in tumor rind encasing the entire left lung concerning for progression of disease. There is also been new diffuse increased interstitial thickening throughout the left lung worrisome for lymphangitic spread of disease.   His SOB is some better, but he isn't able to get any fluid from chest tube.  He has f/u pending with  oncology.   Observations/Objective: nad Speech wnl.  Doesn't sound SOB.  Assessment and Plan: DM2, improved but with weight loss likely related to cancer.  Hypoglycemia cautions d/w pt.  Will plan on recheck in 3 months but if any sig changes in the meantime, then patient will update me.  He agrees with plan.   I'll await oncology notes.  D/w pt about palliative care and I asked him to consider this.  He is going to continue with his treatment plan as is for now, "taking it one day at a time" and he'll update me as needed.    I thanked him for taking the call.   Follow Up Instructions: see above.    I discussed the assessment and treatment plan with the patient. The patient was provided an opportunity to ask questions and all were answered. The patient agreed with the plan and demonstrated an understanding of the instructions.   The patient was advised to call back or seek an in-person evaluation if the symptoms worsen or if the condition fails to improve as anticipated.  I provided 15 minutes of non-face-to-face time during this encounter.  Elsie Stain, MD

## 2018-11-08 NOTE — Progress Notes (Signed)
Patient pre screened for office appointment, no questions or concerns today. 

## 2018-11-09 ENCOUNTER — Inpatient Hospital Stay: Payer: Medicare Other

## 2018-11-09 ENCOUNTER — Inpatient Hospital Stay (HOSPITAL_BASED_OUTPATIENT_CLINIC_OR_DEPARTMENT_OTHER): Payer: Medicare Other | Admitting: Hospice and Palliative Medicine

## 2018-11-09 ENCOUNTER — Inpatient Hospital Stay: Payer: Medicare Other | Attending: Oncology

## 2018-11-09 ENCOUNTER — Inpatient Hospital Stay (HOSPITAL_BASED_OUTPATIENT_CLINIC_OR_DEPARTMENT_OTHER): Payer: Medicare Other | Admitting: Oncology

## 2018-11-09 ENCOUNTER — Other Ambulatory Visit: Payer: Self-pay

## 2018-11-09 VITALS — BP 115/71 | HR 80 | Temp 98.5°F | Resp 16 | Wt 196.9 lb

## 2018-11-09 DIAGNOSIS — F17210 Nicotine dependence, cigarettes, uncomplicated: Secondary | ICD-10-CM | POA: Insufficient documentation

## 2018-11-09 DIAGNOSIS — R634 Abnormal weight loss: Secondary | ICD-10-CM | POA: Diagnosis not present

## 2018-11-09 DIAGNOSIS — E785 Hyperlipidemia, unspecified: Secondary | ICD-10-CM | POA: Diagnosis not present

## 2018-11-09 DIAGNOSIS — R5383 Other fatigue: Secondary | ICD-10-CM | POA: Insufficient documentation

## 2018-11-09 DIAGNOSIS — Z791 Long term (current) use of non-steroidal anti-inflammatories (NSAID): Secondary | ICD-10-CM | POA: Diagnosis not present

## 2018-11-09 DIAGNOSIS — Z794 Long term (current) use of insulin: Secondary | ICD-10-CM | POA: Insufficient documentation

## 2018-11-09 DIAGNOSIS — Z7189 Other specified counseling: Secondary | ICD-10-CM

## 2018-11-09 DIAGNOSIS — R630 Anorexia: Secondary | ICD-10-CM | POA: Insufficient documentation

## 2018-11-09 DIAGNOSIS — Z5112 Encounter for antineoplastic immunotherapy: Secondary | ICD-10-CM | POA: Insufficient documentation

## 2018-11-09 DIAGNOSIS — Z515 Encounter for palliative care: Secondary | ICD-10-CM

## 2018-11-09 DIAGNOSIS — J984 Other disorders of lung: Secondary | ICD-10-CM | POA: Insufficient documentation

## 2018-11-09 DIAGNOSIS — I10 Essential (primary) hypertension: Secondary | ICD-10-CM | POA: Diagnosis not present

## 2018-11-09 DIAGNOSIS — Z8249 Family history of ischemic heart disease and other diseases of the circulatory system: Secondary | ICD-10-CM | POA: Insufficient documentation

## 2018-11-09 DIAGNOSIS — J439 Emphysema, unspecified: Secondary | ICD-10-CM | POA: Diagnosis not present

## 2018-11-09 DIAGNOSIS — Z79899 Other long term (current) drug therapy: Secondary | ICD-10-CM | POA: Diagnosis not present

## 2018-11-09 DIAGNOSIS — Z951 Presence of aortocoronary bypass graft: Secondary | ICD-10-CM | POA: Diagnosis not present

## 2018-11-09 DIAGNOSIS — E1136 Type 2 diabetes mellitus with diabetic cataract: Secondary | ICD-10-CM | POA: Diagnosis not present

## 2018-11-09 DIAGNOSIS — C3490 Malignant neoplasm of unspecified part of unspecified bronchus or lung: Secondary | ICD-10-CM

## 2018-11-09 DIAGNOSIS — C3410 Malignant neoplasm of upper lobe, unspecified bronchus or lung: Secondary | ICD-10-CM | POA: Diagnosis not present

## 2018-11-09 DIAGNOSIS — R0602 Shortness of breath: Secondary | ICD-10-CM | POA: Diagnosis not present

## 2018-11-09 DIAGNOSIS — Z7982 Long term (current) use of aspirin: Secondary | ICD-10-CM | POA: Insufficient documentation

## 2018-11-09 DIAGNOSIS — J910 Malignant pleural effusion: Secondary | ICD-10-CM | POA: Insufficient documentation

## 2018-11-09 DIAGNOSIS — J90 Pleural effusion, not elsewhere classified: Secondary | ICD-10-CM

## 2018-11-09 LAB — COMPREHENSIVE METABOLIC PANEL
ALT: 9 U/L (ref 0–44)
AST: 12 U/L — ABNORMAL LOW (ref 15–41)
Albumin: 2.3 g/dL — ABNORMAL LOW (ref 3.5–5.0)
Alkaline Phosphatase: 63 U/L (ref 38–126)
Anion gap: 11 (ref 5–15)
BUN: 12 mg/dL (ref 8–23)
CO2: 26 mmol/L (ref 22–32)
Calcium: 8 mg/dL — ABNORMAL LOW (ref 8.9–10.3)
Chloride: 94 mmol/L — ABNORMAL LOW (ref 98–111)
Creatinine, Ser: 0.77 mg/dL (ref 0.61–1.24)
GFR calc Af Amer: >60 mL/min (ref >60)
GFR calc non Af Amer: >60 mL/min (ref >60)
Glucose, Bld: 361 mg/dL — ABNORMAL HIGH (ref 70–99)
Potassium: 3.5 mmol/L (ref 3.5–5.1)
Sodium: 131 mmol/L — ABNORMAL LOW (ref 135–145)
Total Bilirubin: 0.8 mg/dL (ref 0.3–1.2)
Total Protein: 6.5 g/dL (ref 6.5–8.1)

## 2018-11-09 LAB — CBC WITH DIFFERENTIAL/PLATELET
Abs Immature Granulocytes: 0.56 10*3/uL — ABNORMAL HIGH (ref 0.00–0.07)
Basophils Absolute: 0.2 10*3/uL — ABNORMAL HIGH (ref 0.0–0.1)
Basophils Relative: 1 %
Eosinophils Absolute: 0.4 10*3/uL (ref 0.0–0.5)
Eosinophils Relative: 2 %
HCT: 33.5 % — ABNORMAL LOW (ref 39.0–52.0)
Hemoglobin: 11 g/dL — ABNORMAL LOW (ref 13.0–17.0)
Immature Granulocytes: 3 %
Lymphocytes Relative: 4 %
Lymphs Abs: 0.8 10*3/uL (ref 0.7–4.0)
MCH: 29.5 pg (ref 26.0–34.0)
MCHC: 32.8 g/dL (ref 30.0–36.0)
MCV: 89.8 fL (ref 80.0–100.0)
Monocytes Absolute: 1.6 10*3/uL — ABNORMAL HIGH (ref 0.1–1.0)
Monocytes Relative: 9 %
Neutro Abs: 15.4 10*3/uL — ABNORMAL HIGH (ref 1.7–7.7)
Neutrophils Relative %: 81 %
Platelets: 581 10*3/uL — ABNORMAL HIGH (ref 150–400)
RBC: 3.73 MIL/uL — ABNORMAL LOW (ref 4.22–5.81)
RDW: 13.3 % (ref 11.5–15.5)
WBC: 18.9 10*3/uL — ABNORMAL HIGH (ref 4.0–10.5)
nRBC: 0 % (ref 0.0–0.2)

## 2018-11-09 MED ORDER — SODIUM CHLORIDE 0.9 % IV SOLN
200.0000 mg | Freq: Once | INTRAVENOUS | Status: AC
  Administered 2018-11-09: 200 mg via INTRAVENOUS
  Filled 2018-11-09: qty 8

## 2018-11-09 MED ORDER — SODIUM CHLORIDE 0.9 % IV SOLN
Freq: Once | INTRAVENOUS | Status: AC
  Administered 2018-11-09: 10:00:00 via INTRAVENOUS
  Filled 2018-11-09: qty 250

## 2018-11-09 MED ORDER — DRONABINOL 5 MG PO CAPS: 5.0000 mg | ORAL_CAPSULE | Freq: Two times a day (BID) | ORAL | 0 refills | Status: DC

## 2018-11-09 NOTE — Progress Notes (Signed)
Hematology/Oncologyfollow up  note [facility] Telephone:([phone] [phone]   Patient Care Team: Tonia Ghent, MD as PCP - General (Family Medicine) Thelma Comp, Alpha (Optometry) Telford Nab, RN as Registered Nurse  REFERRING PROVIDER: Tonia Ghent, MD  CHIEF COMPLAINTS/REASON FOR VISIT:  Follow-up for metastatic lung adenocarcinoma  HISTORY OF PRESENTING ILLNESS:   Craig Koch is a  63 y.o.  male with PMH listed below was seen in consultation at the request of  Tonia Ghent, MD  for evaluation of malignant pleural effusion. Patient was recently admitted to [facility] due to large amount of pleural effusion, acute hypoxic respiratory failure. Patient reports feeling shortness of breath and dyspnea on exertion for several weeks and presented to emergency room.  A chest x-ray 08/23/2018 showed a large amount of left pleural effusion 08/24/2018 CT chest abdomen pelvis confirmed large left pleural effusion with near complete collapse of the left lower lobe and extensive volume loss in the lingula and a portion of the left upper lobe. No acute intra-abdominal process.  Cholelithiasis, diverticulitis, coronary atherosclerosis post CABG.  Bilateral renal cortical scarring.  Aortic atherosclerosis.  #08/24/2018 ultrasound guided diagnostic and therapeutic thoracentesis removed 1.8 L of pleural fluid. Patient subsequently felt better and was discharged to follow-up with pulmonology. 09/04/2018 patient followed up with Dr.Aleskerov chest x-ray showed recurrent moderate left pleural effusion.  Patient underwent ultrasound-guided left thoracentesis again and it drained 3 L of fluid. Pleural fluid cytology positive for adenocarcinoma. Patient was referred to me for further evaluation and management.  # PET eft sided pleural hypermetabolic activity  consistent with malignant pleural effusion. Left upper and lower lobe low-level hypermetabolic him  corresponding to areas of airspace and groundglass opacity.  This was felt to be secondary to atelectasis.  Underlying left upper lobe primary bronchogenic carcinoma cannot be excluded. Medial left upper lobe area more focal hypermetabolic area could represent a pleural disease or a site of small left upper lobe primary. No evidence of extra thoracic hypermetabolic metastatic disease.  Left sided hydro-pneumothorax within the pleural air component being new since the prior CT. Cholelithiasis.  #MRI brain showed punctate focus of enhancement within the right parietal lobe cortex suspicious for possible metastasis.   INTERVAL HISTORY Craig Koch is a 63 y.o. 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  Problems and complaints are listed below: Status post 1 cycle of immunotherapy. Patient adamantly declines chemotherapy. Pleurx placed by Dr. Genevive Bi on 10/01/2018.  No longer draining fluid.  Dr. Genevive Bi planning to remove Pleurx. Patient is accompanied by wife today. Patient reports poor appetite.  Decreased oral intake. Continues to feel shortness of breath, cough, yellowish mucus He is on 7 days course of Levaquin 750 mg, he skipped a few days. Reports no significant improvement in terms of his shortness of breath and a cough. In the clinic on pulse ox is 93% resting. Chronic fatigue, worsened lost more weight.    Review of Systems  Constitutional: Positive for appetite change, fatigue and unexpected weight change. Negative for chills and fever.  HENT:   Negative for hearing loss and voice change.   Eyes: Negative for eye problems and icterus.  Respiratory: Positive for cough and shortness of breath. Negative for chest tightness.   Cardiovascular: Negative for chest pain and leg swelling.  Gastrointestinal: Negative for abdominal distention and abdominal pain.  Endocrine: Negative for hot  flashes.  Genitourinary: Negative for difficulty urinating, dysuria and frequency.   Musculoskeletal: Negative for arthralgias.  Skin: Negative for itching and rash.  Neurological: Negative for light-headedness and numbness.  Hematological: Negative for adenopathy. Does not bruise/bleed easily.  Psychiatric/Behavioral: Negative for confusion.    MEDICAL HISTORY:  Past Medical History:  Diagnosis Date   Anxiety    Arthritis    Cancer ([place])    lung   Coronary artery disease    Diabetes mellitus    Dyslipidemia    Hx of CABG    Hyperlipidemia    Hypertension    Pleural effusion     SURGICAL HISTORY: Past Surgical History:  Procedure Laterality Date   CATARACT EXTRACTION Left 09/2015   CHEST TUBE INSERTION Left 10/01/2018   Procedure: INSERTION PLEURAL DRAINAGE CATHETER;  Surgeon: [name], MD;  Location: [facility] ORS;  Service: Thoracic;  Laterality: Left;   CORONARY ARTERY BYPASS GRAFT  2004   (CABG with LIMA to the  LAD, SVG to OM2/OM3, SVG  to diag   Left ankle surgery     repair of fracture   Right lower leg surgery     rod   SOCIAL HISTORY: Social History   Socioeconomic History   Marital status: Married    Spouse name: Not on file   Number of children: Not on file   Years of education: Not on file   Highest education level: Not on file  Occupational History   Not on file  Social Needs   Financial resource strain: Not on file   Food insecurity    Worry: Not on file    Inability: Not on file   Transportation needs    Medical: Not on file    Non-medical: Not on file  Tobacco Use   Smoking status: Current Every Day Smoker    Packs/day: 0.25    Years: 45.00    Pack years: 11.25    Types: Cigarettes   Smokeless tobacco: Former Systems developer    Types: Snuff  Substance and Sexual Activity   Alcohol use: Yes    Alcohol/week: 6.0 standard drinks    Types: 6 Cans of beer per week    Comment: occ, average 6 pack in a week   Drug use: No     Sexual activity: Never  Lifestyle   Physical activity    Days per week: Not on file    Minutes per session: Not on file   Stress: Not on file  Relationships   Social connections    Talks on phone: Not on file    Gets together: Not on file    Attends religious service: Not on file    Active member of club or organization: Not on file    Attends meetings of clubs or organizations: Not on file    Relationship status: Not on file   Intimate partner violence    Fear of current or ex partner: Not on file    Emotionally abused: Not on file    Physically abused: Not on file    Forced sexual activity: Not on file  Other Topics Concern   Not on file  Social History Narrative   On disability 2009 after prev injuries and CAD.     Married 1976   2 kids, 4 grandkids.      FAMILY HISTORY: Family History  Problem Relation Age of Onset   Dementia Mother    Heart disease Father    Colon cancer Neg Hx  Prostate cancer Neg Hx    Diabetes Neg Hx    ALLERGIES:  is allergic to lipitor [atorvastatin calcium].  MEDICATIONS: PHYSICAL EXAMINATION: Current Outpatient Medications on File Prior to Visit  Medication Sig Dispense Refill   albuterol (VENTOLIN HFA) 108 (90 Base) MCG/ACT inhaler Inhale 2 puffs into the lungs every 6 (six) hours as needed for wheezing or shortness of breath. 18 g 0   aspirin 325 MG tablet Take 325 mg by mouth daily.     BD INSULIN SYRINGE U/F 31G X 5/16" 0.5 ML MISC USE DAILY AS DIRECTED. DX E11.9 100 each 1   DULoxetine (CYMBALTA) 30 MG capsule Take 1 capsule (30 mg total) by mouth daily.     Fexofenadine HCl (ALLEGRA ALLERGY PO) Take 1 tablet by mouth daily as needed.     glucose blood (ONE TOUCH ULTRA TEST) test strip USE TO TEST GLUCOSE LEVELS 4 (FOUR) TIMES DAILY. 400 each 3   ibuprofen (ADVIL,MOTRIN) 200 MG tablet Take 400 mg by mouth every 6 (six) hours as needed.      insulin detemir (LEVEMIR) 100 UNIT/ML injection Inject 0.3 mLs (30  Units total) into the skin at bedtime. 10 mL 5   insulin lispro (HUMALOG) 100 UNIT/ML injection Inject 0.15 mLs (15 Units total) into the skin 3 (three) times daily with meals.     levofloxacin (LEVAQUIN) 750 MG tablet Take 1 tablet (750 mg total) by mouth daily. 7 tablet 0   lisinopril (ZESTRIL) 2.5 MG tablet TAKE 1 TABLET BY MOUTH DAILY 90 tablet 2   metFORMIN (GLUCOPHAGE) 500 MG tablet TAKE 1 TABLET (500 MG TOTAL) BY MOUTH 2 (TWO) TIMES DAILY WITH A MEAL. 180 tablet 1   metoprolol tartrate (LOPRESSOR) 100 MG tablet TAKE 0.5 TABLETS (50 MG TOTAL) BY MOUTH 2 (TWO) TIMES DAILY. 90 tablet 1   Omega-3 Fatty Acids (FISH OIL) 1200 MG CAPS Take 2 capsules by mouth 3 (three) times daily with meals.     rosuvastatin (CRESTOR) 10 MG tablet TAKE 1 TABLET (10 MG TOTAL) BY MOUTH DAILY. 90 tablet 3   No current facility-administered medications on file prior to visit.     ECOG PERFORMANCE STATUS:2  Today's Vitals   11/09/18 0852  BP: 115/71  Pulse: 80  Resp: 16  Temp: 98.5 F (36.9 C)  TempSrc: Temporal  SpO2: 93%  Weight: 196 lb 14.4 oz (89.3 kg)   Body mass index is 26.7 kg/m.  Physical Exam Constitutional:      General: He is not in acute distress.    Appearance: He is ill-appearing.     Comments: Cachectic  HENT:     Head: Normocephalic and atraumatic.  Eyes:     General: No scleral icterus.    Pupils: Pupils are equal, round, and reactive to light.  Neck:     Musculoskeletal: Normal range of motion and neck supple.  Cardiovascular:     Rate and Rhythm: Regular rhythm. Tachycardia present.     Heart sounds: Normal heart sounds.  Pulmonary:     Effort: Pulmonary effort is normal. No respiratory distress.     Breath sounds: No wheezing.     Comments: Decreased breathing sounds left lower lobe.   Abdominal:     General: Bowel sounds are normal. There is no distension.     Palpations: Abdomen is soft. There is no mass.     Tenderness: There is no abdominal tenderness.   Musculoskeletal: Normal range of motion.        General:  No deformity.  Skin:    General: Skin is warm and dry.     Findings: No erythema or rash.  Neurological:     Mental Status: He is alert and oriented to person, place, and time.     Cranial Nerves: No cranial nerve deficit.     Coordination: Coordination normal.  Psychiatric:        Behavior: Behavior normal.        Thought Content: Thought content normal.     LABORATORY DATA:  I have reviewed the data as listed Lab Results  Component Value Date   WBC 18.9 (H) 11/09/2018   HGB 11.0 (L) 11/09/2018   HCT 33.5 (L) 11/09/2018   MCV 89.8 11/09/2018   PLT 581 (H) 11/09/2018   Recent Labs    10/19/18 0841 11/02/18 1238 11/09/18 0833  NA 137 132* 131*  K 4.0 4.0 3.5  CL 104 95* 94*  CO2 24 22 26   GLUCOSE 252* 425* 361*  BUN 14 15 12   CREATININE 0.61 0.84 0.77  CALCIUM 8.5* 8.5* 8.0*  GFRNONAA >60 >60 >60  GFRAA >60 >60 >60  PROT 6.0* 7.0 6.5  ALBUMIN 3.0* 2.6* 2.3*  AST 14* 11* 12*  ALT 10 10 9   ALKPHOS 69 75 63  BILITOT 0.5 0.9 0.8   Iron/TIBC/Ferritin/ %Sat No results found for: IRON, TIBC, FERRITIN, IRONPCTSAT    RADIOGRAPHIC STUDIES: I have personally reviewed the radiological images as listed and agreed with the findings in the report.   Dg Chest 2 View  Result Date: 11/02/2018 CLINICAL DATA:  Pleural effusion with pleural catheter. EXAM: CHEST - 2 VIEW COMPARISON:  10/15/2018 FINDINGS: Evidence of patient's PleurX catheter over the left base. Stable to slightly worse moderate size left pleural effusion likely with associated basilar atelectasis. Patchy opacification over the left lung which may be due to atelectasis or infection. Right lung is clear. Cardiomediastinal silhouette and remainder of the exam is unchanged. IMPRESSION: 1. Stable to slightly worse moderate size left pleural effusion likely with associated basilar atelectasis. PleurX drainage catheter present over the left base. 2. Slight  worsening hazy patchy opacification over the left lung which may be due to atelectasis or infection. Electronically Signed   By: Marin Olp M.D.   On: 11/02/2018 09:23   Dg Chest 2 View  Result Date: 10/15/2018 CLINICAL DATA:  Follow-up for a left pleural effusion. EXAM: CHEST - 2 VIEW COMPARISON:  Chest radiograph dated 10/04/2018. FINDINGS: The heart size and mediastinal contours are not significantly changed. A left-sided thoracostomy tube terminates near the medial left lung apex, unchanged. A small a moderate left pleural effusion with associated atelectasis/airspace disease is not significantly changed. The right lung is clear without pleural effusion. There is no pneumothorax. Median sternotomy wires are redemonstrated. Soft tissue gas overlying the left chest and axilla has decreased. IMPRESSION: Unchanged left-sided thoracostomy tube with unchanged left pleural effusion and associated atelectasis/airspace disease. Electronically Signed   By: Zerita Boers M.D.   On: 10/15/2018 14:57   Dg Chest 2 View  Result Date: 10/04/2018 CLINICAL DATA:  Left lung cancer, follow-up chest tube EXAM: CHEST - 2 VIEW COMPARISON:  10/02/2018 FINDINGS: Left-sided hydropneumothorax with left-sided chest tube and slight interval increase in a small left pleural effusion component with associated extensive diffuse interstitial pulmonary opacity and partial atelectasis of the left lung. There remains a small, approximately 10% left pneumothorax, possibly with a trapped left lung. The right lung is normally aerated. Increased subcutaneous emphysema about the left  chest wall and neck. Status post median sternotomy and CABG. IMPRESSION: 1. Left-sided hydropneumothorax with left-sided chest tube and slight interval increase in a small left pleural effusion component with associated extensive diffuse interstitial pulmonary opacity and partial atelectasis of the left lung. 2. There remains a small, approximately 10% left  pneumothorax, possibly with a trapped left lung. 3. Increased subcutaneous emphysema about the left chest wall and neck. Electronically Signed   By: Eddie Candle M.D.   On: 10/04/2018 13:28   Chest 2 View  Result Date: 09/28/2018 CLINICAL DATA:  63 year old male under preoperative evaluation prior to scheduled surgery. Pleural effusion. Shortness of breath. EXAM: CHEST - 2 VIEW COMPARISON:  Chest x-ray 09/13/2018.  PET-CT 09/18/2018. FINDINGS: Large left-sided hydropneumothorax again noted, with small pneumothorax component in the apex of the left hemithorax and large pleural fluid component. No aerated left lung is identified, presumably secondary to underlying atelectasis. The right lung is clear. No right pleural effusion. No evidence of pulmonary edema. Cardiac silhouette is largely obscured. Status post median sternotomy for CABG. IMPRESSION: 1. Large left hydropneumothorax with probable complete atelectasis of the left lung. The hydropneumothorax is similar to recent PET-CT, although the degree of atelectasis in the left lung has increased. Electronically Signed   By: Vinnie Langton M.D.   On: 09/28/2018 16:44   Dg Chest 2 View  Result Date: 09/07/2018 CLINICAL DATA:  Decreased breath sounds in the left lower lobe. EXAM: CHEST - 2 VIEW COMPARISON:  September 05, 2018 FINDINGS: There is a persistent but improving left lower lobe airspace opacity. There is a persistent moderate left-sided pleural effusion. There is no pneumothorax. There is generalized volume overload. The patient is status post prior median sternotomy. The heart size is stable. There is elevation of the left hemidiaphragm. IMPRESSION: 1. Moderate left-sided pleural effusion. 2. Persistent airspace opacity involving the left lower lobe favored to represent atelectasis with an infiltrate not excluded. 3. No pneumothorax. Electronically Signed   By: Constance Holster M.D.   On: 09/07/2018 15:01   Dg Chest 2 View  Result Date:  08/30/2018 CLINICAL DATA:  Follow-up pleural effusion. EXAM: CHEST - 2 VIEW COMPARISON:  Chest radiographs 08/24/2018, 08/23/2018 FINDINGS: Stable cardiomediastinal contours status post median sternotomy and CABG. The right lung is clear. There are bandlike opacities in the left mid lung likely representing atelectasis. Persistent moderate sized left pleural effusion. No pneumothorax. No acute osseous abnormality in the visualized skeleton. IMPRESSION: Persistent moderate size left pleural effusion and associated left lung atelectasis. Electronically Signed   By: Audie Pinto M.D.   On: 08/30/2018 20:37   Dg Chest 2 View  Result Date: 08/23/2018 CLINICAL DATA:  Shortness of breath 2 weeks with productive cough. EXAM: CHEST - 2 VIEW COMPARISON:  None. FINDINGS: There is moderate opacification over the lower half of the left lung likely large effusion with associated basilar atelectasis. Fluid tracks into the apex. Right lung is clear. Left heart border is obscured by the large left effusion. Visualized mediastinum is unremarkable. Degenerative change of the spine. IMPRESSION: Large left pleural effusion likely with associated basilar atelectasis. Electronically Signed   By: Marin Olp M.D.   On: 08/23/2018 19:13   Ct Chest W Contrast  Result Date: 08/24/2018 CLINICAL DATA:  Chest pain, shortness of breath, fusion suspected. Smoker, approximately 25 pack years EXAM: CT CHEST AND ABDOMEN WITH CONTRAST TECHNIQUE: Multidetector CT imaging of the chest and abdomen was performed following the standard protocol during bolus administration of intravenous contrast. CONTRAST:  126mL OMNIPAQUE IOHEXOL 300 MG/ML  SOLN COMPARISON:  Same-day radiograph FINDINGS: CT CHEST FINDINGS Cardiovascular: Postsurgical changes related to prior CABG. Extensive calcification of the native coronary arteries. Heart is normal size. No pericardial effusion. Atherosclerotic plaque within the normal caliber aorta. Pulmonary arteries  are normal caliber. Mediastinum/Nodes: Small amount stranding in the mediastinum likely related to prior CABG. Subcentimeter hypoattenuating nodule in the left thyroid lobe (2/3). Trachea is patent. Esophagus is unremarkable. Lungs/Pleura: There is a large left pleural effusion with near complete collapse of the left lower lobe and extensive volume loss in the lingula and portions of the left upper lobe as well. There is abrupt tapering of the tracheobronchial tree is inter is into the airless portions of lung. Some ground-glass attenuation is noted adjacent to the fusion likely reflecting passive atelectatic change. In the right lung is clear. No pneumothorax. No visible nodules or suspicious masses though these are not fully excluded in the regions of airless lung. Musculoskeletal: Postsurgical changes from prior sternotomy with intact and aligned sutures. Multilevel degenerative changes are present in the imaged portions of the spine. Additional degenerative changes in the shoulders. No acute or suspicious osseous lesion. No chest wall abnormality. CT ABDOMEN FINDINGS Hepatobiliary: No focal liver lesion. Few calcified gallstones layer dependently within the gallbladder. No biliary ductal dilatation. Pancreas: Unremarkable. No pancreatic ductal dilatation or surrounding inflammatory changes. Spleen: Normal in size without focal abnormality. Adrenals/Urinary Tract: Normal adrenal glands. Mild bilateral nonspecific perinephric stranding, a nonspecific finding though may correlate with either age or decreased renal function. Lobular appearance of the kidneys may reflect cortical scarring. Kidneys are otherwise unremarkable, without renal calculi, suspicious lesion, or hydronephrosis. Bladder is unremarkable. Stomach/Bowel: Distal esophagus, stomach and duodenal sweep are unremarkable. No bowel wall thickening or dilatation. No evidence of obstruction. A normal appendix is visualized. Scattered colonic diverticula  without focal pericolonic inflammation to suggest diverticulitis. Vascular/Lymphatic: Extensive atheromatous plaque throughout the aorta and branch vessels. [place] of the renal arteries are heavily diseased of the branches opacify normally. Normal opacification of the portal venous Other: No abdominopelvic free fluid or free gas. No bowel containing hernias. Small amount of soft tissue infiltration in the low anterior abdomen. Musculoskeletal: Multilevel degenerative changes are present in the imaged portions of the spine. Few remote appearing compression deformities in the lumbar spine at L2, L4 and L5 as well as a limbus vertebrae at L3 could correlate for point tenderness however IMPRESSION: 1. Large left pleural effusion with near complete collapse of the left lower lobe and extensive volume loss in the lingula and portions of the left upper lobe. Underlying consolidation or lesion cannot be excluded. 2. No acute intra-abdominal process. 3. Cholelithiasis without evidence of acute cholecystitis. 4. Diverticulosis without evidence of acute diverticulitis. 5. Coronary atherosclerosis, post CABG. 6. Bilateral renal cortical scarring 7. Aortic Atherosclerosis (ICD10-I70.0). Electronically Signed   By: Lovena Le M.D.   On: 08/24/2018 00:19   Ct Angio Chest Pe W Or Wo Contrast  Result Date: 11/02/2018 CLINICAL DATA:  History of Lung cancer.  Rule out pulmonary embolus. EXAM: CT ANGIOGRAPHY CHEST WITH CONTRAST TECHNIQUE: Multidetector CT imaging of the chest was performed using the standard protocol during bolus administration of intravenous contrast. Multiplanar CT image reconstructions and MIPs were obtained to evaluate the vascular anatomy. CONTRAST:  95mL OMNIPAQUE IOHEXOL 350 MG/ML SOLN COMPARISON:  PET-CT from 09/18/2018 FINDINGS: Cardiovascular: Normal heart size. Mild pericardial thickening identified. Aortic atherosclerosis. Previous CABG. Main pulmonary artery appears patent. No saddle embolus. No  central  obstructing pulmonary artery filling defects noted. No lobar or segmental pulmonary artery filling defects identified. Mediastinum/Nodes: The trachea appears patent and is midline. Normal appearance of the esophagus. No mediastinal, axillary adenopathy. New left supraclavicular lymph node measures 0.9 cm, image 19/5. Lungs/Pleura: Moderate changes of centrilobular emphysema. Small right pleural effusion is new from previous exam. Interval placement of left-sided chest with decrease in volume of previous large loculated left pleural effusion. Unfortunately, there is been significant interval increase in pleural thickening with enhancement overlying the entire left lung concerning for pleural spread of tumor. Pleural based tumor along the paramediastinal left upper lobe has a thickness of 2.9 cm, image 145/6. With 0.8 cm previously. There is new diffuse interstitial thickening throughout the left lung concerning for lymphangitic spread of disease. Right lung appears hyperinflated but relatively clear. Upper Abdomen: Stones identified within the gallbladder. No acute abnormality. Musculoskeletal: No chest wall abnormality. No acute or significant osseous findings. Review of the MIP images confirms the above findings. IMPRESSION: 1. No evidence for acute pulmonary embolus. 2. Interval placement of left-sided chest tube with significant decrease in volume of loculated left pleural effusion. Unfortunately, there is been marked increase in tumor rind encasing the entire left lung concerning for progression of disease. There is also been new diffuse increased interstitial thickening throughout the left lung worrisome for lymphangitic spread of disease. Aortic Atherosclerosis (ICD10-I70.0) and Emphysema (ICD10-J43.9). Electronically Signed   By: Kerby Moors M.D.   On: 11/02/2018 14:16   Mr Jeri Cos ZO Contrast  Result Date: 09/21/2018 CLINICAL DATA:  Malignant neoplasm of unspecified part of unspecified  bronchus or lung. Lung cancer, non-small cell, staging EXAM: MRI HEAD WITHOUT AND WITH CONTRAST TECHNIQUE: Multiplanar, multiecho pulse sequences of the brain and surrounding structures were obtained without and with intravenous contrast. CONTRAST:  20mL GADAVIST GADOBUTROL 1 MMOL/ML IV SOLN COMPARISON:  No pertinent prior studies available for comparison. FINDINGS: Brain: A punctate focus of enhancement within the right parietal lobe cortex is suspicious for possible metastasis (series 18, image 114). No other foci of abnormal intracranial enhancement are identified. There is no acute infarct. No midline shift or extra-axial fluid collection. No chronic intracranial hemorrhage. Mild scattered T2/FLAIR hyperintensity within the cerebral white matter and brainstem is nonspecific, but consistent with chronic small vessel ischemic disease. Mild generalized parenchymal atrophy. Vascular: Flow voids maintained within the proximal large arterial vessels. Skull and upper cervical spine: Normal marrow signal. Sinuses/Orbits: Imaged globes and orbits demonstrate no acute abnormality. Mild mucosal thickening within a small right maxillary sinus. No significant mastoid effusion. IMPRESSION: Punctate focus of enhancement within the right parietal lobe cortex suspicious for possible metastasis. Three-month contrast-enhanced MRI follow-up is recommended. Mild generalized parenchymal atrophy and chronic small vessel ischemic disease. Right maxillary sinus disease as described. Electronically Signed   By: Kellie Simmering   On: 09/21/2018 10:00   Ct Abdomen Pelvis W Contrast  Result Date: 08/24/2018 CLINICAL DATA:  Chest pain, shortness of breath, fusion suspected. Smoker, approximately 25 pack years EXAM: CT CHEST AND ABDOMEN WITH CONTRAST TECHNIQUE: Multidetector CT imaging of the chest and abdomen was performed following the standard protocol during bolus administration of intravenous contrast. CONTRAST:  11mL OMNIPAQUE IOHEXOL  300 MG/ML  SOLN COMPARISON:  Same-day radiograph FINDINGS: CT CHEST FINDINGS Cardiovascular: Postsurgical changes related to prior CABG. Extensive calcification of the native coronary arteries. Heart is normal size. No pericardial effusion. Atherosclerotic plaque within the normal caliber aorta. Pulmonary arteries are normal caliber. Mediastinum/Nodes: Small amount stranding in the mediastinum  likely related to prior CABG. Subcentimeter hypoattenuating nodule in the left thyroid lobe (2/3). Trachea is patent. Esophagus is unremarkable. Lungs/Pleura: There is a large left pleural effusion with near complete collapse of the left lower lobe and extensive volume loss in the lingula and portions of the left upper lobe as well. There is abrupt tapering of the tracheobronchial tree is inter is into the airless portions of lung. Some ground-glass attenuation is noted adjacent to the fusion likely reflecting passive atelectatic change. In the right lung is clear. No pneumothorax. No visible nodules or suspicious masses though these are not fully excluded in the regions of airless lung. Musculoskeletal: Postsurgical changes from prior sternotomy with intact and aligned sutures. Multilevel degenerative changes are present in the imaged portions of the spine. Additional degenerative changes in the shoulders. No acute or suspicious osseous lesion. No chest wall abnormality. CT ABDOMEN FINDINGS Hepatobiliary: No focal liver lesion. Few calcified gallstones layer dependently within the gallbladder. No biliary ductal dilatation. Pancreas: Unremarkable. No pancreatic ductal dilatation or surrounding inflammatory changes. Spleen: Normal in size without focal abnormality. Adrenals/Urinary Tract: Normal adrenal glands. Mild bilateral nonspecific perinephric stranding, a nonspecific finding though may correlate with either age or decreased renal function. Lobular appearance of the kidneys may reflect cortical scarring. Kidneys are  otherwise unremarkable, without renal calculi, suspicious lesion, or hydronephrosis. Bladder is unremarkable. Stomach/Bowel: Distal esophagus, stomach and duodenal sweep are unremarkable. No bowel wall thickening or dilatation. No evidence of obstruction. A normal appendix is visualized. Scattered colonic diverticula without focal pericolonic inflammation to suggest diverticulitis. Vascular/Lymphatic: Extensive atheromatous plaque throughout the aorta and branch vessels. [place] of the renal arteries are heavily diseased of the branches opacify normally. Normal opacification of the portal venous Other: No abdominopelvic free fluid or free gas. No bowel containing hernias. Small amount of soft tissue infiltration in the low anterior abdomen. Musculoskeletal: Multilevel degenerative changes are present in the imaged portions of the spine. Few remote appearing compression deformities in the lumbar spine at L2, L4 and L5 as well as a limbus vertebrae at L3 could correlate for point tenderness however IMPRESSION: 1. Large left pleural effusion with near complete collapse of the left lower lobe and extensive volume loss in the lingula and portions of the left upper lobe. Underlying consolidation or lesion cannot be excluded. 2. No acute intra-abdominal process. 3. Cholelithiasis without evidence of acute cholecystitis. 4. Diverticulosis without evidence of acute diverticulitis. 5. Coronary atherosclerosis, post CABG. 6. Bilateral renal cortical scarring 7. Aortic Atherosclerosis (ICD10-I70.0). Electronically Signed   By: Lovena Le M.D.   On: 08/24/2018 00:19   Nm Pet Image Initial (pi) Skull Base To Thigh  Result Date: 09/18/2018 CLINICAL DATA:  Initial treatment strategy for lung nodule. Staging. Left-sided thoracentesis demonstrating adenocarcinoma EXAM: NUCLEAR MEDICINE PET SKULL BASE TO THIGH TECHNIQUE: 11.5 mCi F-18 FDG was injected intravenously. Full-ring PET imaging was performed from the skull base to thigh  after the radiotracer. CT data was obtained and used for attenuation correction and anatomic localization. Fasting blood glucose: 129 mg/dl COMPARISON:  08/24/2018 chest abdomen and pelvic CTs. FINDINGS: Mediastinal blood pool activity: SUV max 2.3 Liver activity: SUV max NA NECK: No areas of abnormal hypermetabolism. Incidental CT findings: Mucosal thickening right maxillary sinus. Bilateral carotid atherosclerosis. No cervical adenopathy. CHEST: Low-level hypermetabolism corresponding to areas of patchy consolidation within the central left upper and less so medial left lower lobe. Example within the central left upper lobe relatively diffusely at a S.U.V. max of 5.2 including on image  105/3. The most focal area of hypermetabolism and nodularity is along the pleural surface adjacent the medial right upper lobe. This measures on the order of a S.U.V. max of 5.5, including on image 90/3. Multifocal parietal pleural hypermetabolism, including corresponding to nodularity at 1.1 cm and a S.U.V. max of 4.1 on 87/3. Incidental CT findings: Left-sided hydropneumothorax. The pleural air component is small to moderate. The amount of left-sided pleural fluid is relatively similar to the CT of 08/23/2018. Emphysema. Aortic and coronary artery atherosclerosis with prior median sternotomy. ABDOMEN/PELVIS: No abdominopelvic parenchymal or nodal hypermetabolism. Incidental CT findings: Normal adrenal glands. Left renal vascular calcification. Dependent gallstone or stones. Abdominal aortic atherosclerosis. SKELETON: No abnormal marrow activity. Incidental CT findings: none IMPRESSION: 1. Left-sided pleural hypermetabolism, consistent with malignant pleural effusion. 2. Left upper and less so left lower lobe low-level hypermetabolism corresponding to areas of airspace and ground-glass opacity. This is primarily felt to represent atelectasis. Underlying left upper lobe primary bronchogenic carcinoma cannot be excluded. This is  suboptimally evaluated secondary to the extent of volume loss from hydropneumothorax. 3. A medial left upper lobe area of more focal hypermetabolism could also represent pleural disease or a site of a small left upper lobe primary. 4. No evidence of extrathoracic hypermetabolic metastasis. 5. Left-sided hydropneumothorax with the pleural air component being new since the prior CT. 6. Cholelithiasis. These results will be called to the ordering clinician or representative by the Radiologist Assistant, and communication documented in the PACS or zVision Dashboard. Electronically Signed   By: Abigail Miyamoto M.D.   On: 09/18/2018 15:26   Dg Chest Port 1 View  Result Date: 10/02/2018 CLINICAL DATA:  Postop check EXAM: PORTABLE CHEST 1 VIEW COMPARISON:  Yesterday FINDINGS: Tracheal extubation. A left chest tube remains. Moderate left pneumothorax seen at the base more than apex. Small volume pleural fluid at the left base, diminished. The right lung remains clear. Normal heart size. CABG. IMPRESSION: Moderate left pneumothorax after pleural fluid evacuation. Electronically Signed   By: Monte Fantasia M.D.   On: 10/02/2018 09:29   Dg Chest Port 1 View  Result Date: 10/01/2018 CLINICAL DATA:  Postop left-sided catheter placement for malignant pleural effusion. EXAM: PORTABLE CHEST 1 VIEW COMPARISON:  09/28/2018 FINDINGS: There is a new left-sided tunneled chest tube. One of the sideholes may be straddling the chest wall. There is a large left-sided pleural effusion. The heart size is likely stable but evaluation is limited by the presence of a large pleural effusion. The patient is status post prior median sternotomy. There is no clear pneumothorax. No acute osseous abnormality. IMPRESSION: 1. Interval placement of a tunneled left-sided PleurX catheter. The last side hole may be straddling the chest wall. This can be further evaluated with oblique imaging. 2. Large left-sided pleural effusion.  No pneumothorax.  Electronically Signed   By: Constance Holster M.D.   On: 10/01/2018 09:09   Dg Chest Port 1 View  Result Date: 09/13/2018 CLINICAL DATA:  History of lung cancer now with recurrent symptomatic left-sided pleural effusion, post thoracentesis. EXAM: PORTABLE CHEST 1 VIEW COMPARISON:  09/07/2018 09/05/2018; chest CT-08/23/2018; ultrasound-guided thoracentesis - 09/05/2018 yielding 3 L pleural fluid FINDINGS: Grossly unchanged cardiac silhouette and mediastinal contours post median sternotomy and CABG. Stable postsurgical change of the left hilum. Interval reduction/near resolution of left-sided pleural effusion post large volume left-sided thoracentesis. Improved aeration of left lung base with persistent left perihilar and basilar heterogeneous/consolidative opacities. Mild pulmonary is congestion without frank evidence of edema. The right lung  remains well aerated. No pneumothorax. No acute osseous abnormalities. IMPRESSION: 1. Interval reduction/near resolution left-sided effusion post thoracentesis. No pneumothorax. 2. Improved aeration of the left lung with persistent left perihilar and basilar opacities, likely atelectasis. Electronically Signed   By: Sandi Mariscal M.D.   On: 09/13/2018 09:05   Dg Chest Port 1 View  Result Date: 09/05/2018 CLINICAL DATA:  Status post left thoracentesis EXAM: PORTABLE CHEST 1 VIEW COMPARISON:  08/30/2018 FINDINGS: Cardiac shadow is stable. Postsurgical changes are again noted. Persistent scarring is noted in the left lung base with minimal residual effusion following thoracentesis. No pneumothorax is noted. No bony abnormality is seen. IMPRESSION: No pneumothorax following left-sided thoracentesis. Electronically Signed   By: Inez Catalina M.D.   On: 09/05/2018 12:20   Dg Chest Port 1 View  Result Date: 08/24/2018 CLINICAL DATA:  Status post left thoracentesis EXAM: PORTABLE CHEST 1 VIEW COMPARISON:  08/23/2018 FINDINGS: Cardiac shadow is stable. Postsurgical changes are  again noted. The right lung is clear. Left lung demonstrates areas of collapse lung as well as mild residual effusion. No pneumothorax is identified IMPRESSION: No evidence of pneumothorax following thoracentesis. Decreased left pleural effusion is noted. Electronically Signed   By: Inez Catalina M.D.   On: 08/24/2018 16:03   US Thoracentesis Asp Pleural Space W/img Guide  Result Date: 09/13/2018 INDICATION: History of lung cancer, now with recurrent left-sided pleural effusion. Patient presents today for ultrasound-guided left-sided thoracentesis for therapeutic purposes. EXAM: US THORACENTESIS ASP PLEURAL SPACE W/IMG GUIDE COMPARISON:  Previous large volume left-sided thoracentesis performed 09/05/2018 (yielding 3 L) and 08/24/2018 (yielding 1.8 L). MEDICATIONS: None. COMPLICATIONS: None immediate. TECHNIQUE: Informed written consent was obtained from the patient after a discussion of the risks, benefits and alternatives to treatment. A timeout was performed prior to the initiation of the procedure. Initial ultrasound scanning demonstrates a large recurrent anechoic left-sided pleural effusion. The lower chest was prepped and draped in the usual sterile fashion. 1% lidocaine was used for local anesthesia. An ultrasound image was saved for documentation purposes. An 8 Fr Safe-T-Centesis catheter was introduced. The thoracentesis was performed. The catheter was removed and a dressing was applied. The patient tolerated the procedure well without immediate post procedural complication. The patient was escorted to have an upright chest radiograph. FINDINGS: A total of approximately 3 liters of slightly blood-tinged fluid was removed. IMPRESSION: Successful ultrasound-guided left sided thoracentesis yielding 3 liters of pleural fluid. Electronically Signed   By: Sandi Mariscal M.D.   On: 09/13/2018 09:08   US Thoracentesis Asp Pleural Space W/img Guide  Result Date: 09/05/2018 INDICATION: Left-sided recurrent pleural  effusion EXAM: ULTRASOUND GUIDED THORACENTESIS MEDICATIONS: None. COMPLICATIONS: None immediate. PROCEDURE: An ultrasound guided thoracentesis was thoroughly discussed with the patient and questions answered. The benefits, risks, alternatives and complications were also discussed. The patient understands and wishes to proceed with the procedure. Written consent was obtained. Ultrasound was performed to localize and mark an adequate pocket of fluid in the left chest. The area was then prepped and draped in the normal sterile fashion. 1% Lidocaine was used for local anesthesia. Under ultrasound guidance, a 6 Fr Safe-T-Centesis catheter was introduced. Thoracentesis was performed. The catheter was removed and a dressing applied. FINDINGS: A total of approximately 3 L of sanguinous fluid was removed. Samples were sent to the laboratory as requested by the clinical team. IMPRESSION: Successful ultrasound guided left thoracentesis yielding 3 L of pleural fluid. Electronically Signed   By: Inez Catalina M.D.   On: 09/05/2018 12:21  US Thoracentesis Asp Pleural Space W/img Guide  Result Date: 08/24/2018 INDICATION: 63 year old with large left pleural effusion and shortness of breath. EXAM: ULTRASOUND GUIDED LEFT THORACENTESIS MEDICATIONS: None. COMPLICATIONS: None immediate. PROCEDURE: An ultrasound guided thoracentesis was thoroughly discussed with the patient and questions answered. The benefits, risks, alternatives and complications were also discussed. The patient understands and wishes to proceed with the procedure. Written consent was obtained. Ultrasound was performed to localize and mark an adequate pocket of fluid in the left chest. The area was then prepped and draped in the normal sterile fashion. 1% Lidocaine was used for local anesthesia. Under ultrasound guidance a 6 Fr Safe-T-Centesis catheter was introduced. Thoracentesis was performed. The catheter was removed and a dressing applied. FINDINGS: A total  of approximately 1.8 L of red fluid was removed. Samples were sent to the laboratory as requested by the clinical team. IMPRESSION: Successful ultrasound guided left thoracentesis yielding 1.8 L of pleural fluid. Electronically Signed   By: Markus Daft M.D.   On: 08/24/2018 12:29     ASSESSMENT & PLAN:  1. lung adenocarcinoma   2. Encounter for antineoplastic immunotherapy   3. Goals of care, counseling/discussion   4. Shortness of breath   5. Recurrent pleural effusion on left    #Stage IV metastatic lung adenocarcinoma TxNx M . No orders of the defined types were placed in this e1 Malignant pleural effusion, M1 disease. 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 I had a lengthy discussion with patient and his wife.  They are aware that monotherapy Keytruda may not be effective enough to control his cancer. CT chest images were independently reviewed and discussed with patient.  Progression of disease. Patient adamantly refuses chemotherapy. Willing to proceed with another cycle of Keytruda today. Goal of care was discussed with patient.  Prognosis is poor. NGS was sent and result is pending.  Patient has appointment to discuss with palliative care service and to discuss goal of care, CODE STATUS #Weight loss, poor oral intake.  Lack of appetite.  Recommend patient to try Marinol 5 mg twice daily for appetite stimulant.  Prescription sent to pharmacy.   All questions were answered. The patient knows to call the clinic with any problems questions or concerns. We spent sufficient time to discuss many aspect of care, questions were answered to patient's satisfaction. Total face to face encounter time for this patient visit was 40 min. >50% of the time was  spent in counseling and coordination of care.    Return of visit:  1 week.  Earlie Server, MD, PhD Hematology Oncology [facility] [place] [facility] [place]- [phone] 11/09/2018 Sputum.

## 2018-11-09 NOTE — Progress Notes (Signed)
[place]  Telephone:([phone] [phone]   Name: Craig Koch Date: 11/09/2018 MRN: [id number]  DOB: [birth year]  Patient Care Team: Tonia Ghent, MD as PCP - General (Family Medicine) Thelma Comp, [place] (Optometry) Telford Nab, RN as Registered Nurse    REASON FOR CONSULTATION: Palliative Care consult requested for this 63 y.o. male with multiple medical problems including stage IV adenocarcinoma of the lung with history of left sided malignant pleural effusion requiring Pleurx.  Patient has opted not to pursue chemotherapy and has instead been treated on immunotherapy.  Patient has had persistent shortness of breath.  CTA of the chest on 11/02/2018 revealed marked increase in tumor encasing the entire left lung with new diffuse increased interstitial thickening concerning for disease progression and lymphangitic spread of disease.  Patient was referred to palliative care to help address goals and manage ongoing symptoms.  SOCIAL HISTORY:     reports that he has been smoking cigarettes. He has a 11.25 pack-year smoking history. He has quit using smokeless tobacco.  His smokeless tobacco use included snuff. He reports current alcohol use of about 6.0 standard drinks of alcohol per week. He reports that he does not use drugs.   Patient is married.  He lives at home with his wife.  He has a son and daughter who live nearby.  Patient used to work as a Theme park manager.  ADVANCE DIRECTIVES:  Not on file  CODE STATUS:   PAST MEDICAL HISTORY: Past Medical History:  Diagnosis Date  . Anxiety   . Arthritis   . Cancer ([place])    lung  . Coronary artery disease   . Diabetes mellitus   . Dyslipidemia   . Hx of CABG   . Hyperlipidemia   . Hypertension   . Pleural effusion     PAST SURGICAL HISTORY:  Past Surgical History:  Procedure Laterality Date  . CATARACT EXTRACTION Left 09/2015  . CHEST TUBE INSERTION Left 10/01/2018    Procedure: INSERTION PLEURAL DRAINAGE CATHETER;  Surgeon: [name], MD;  Location: [facility] ORS;  Service: Thoracic;  Laterality: Left;  . CORONARY ARTERY BYPASS GRAFT  2004   (CABG with LIMA to the  LAD, SVG to OM2/OM3, SVG  to diag  . Left ankle surgery     repair of fracture  . Right lower leg surgery     rod    HEMATOLOGY/ONCOLOGY HISTORY:  Oncology History  lung adenocarcinoma  09/08/2018 Initial Diagnosis   lung adenocarcinoma   10/19/2018 -  Chemotherapy   The patient had pembrolizumab (KEYTRUDA) 200 mg in sodium chloride 0.9 % 50 mL chemo infusion, 200 mg, Intravenous, Once, 2 of 8 cycles Administration: 200 mg (10/19/2018), 200 mg (11/09/2018)  for chemotherapy treatment.      ALLERGIES:  is allergic to lipitor [atorvastatin calcium].  MEDICATIONS:  Current Outpatient Medications  Medication Sig Dispense Refill  . albuterol (VENTOLIN HFA) 108 (90 Base) MCG/ACT inhaler Inhale 2 puffs into the lungs every 6 (six) hours as needed for wheezing or shortness of breath. 18 g 0  . aspirin 325 MG tablet Take 325 mg by mouth daily.    . BD INSULIN SYRINGE U/F 31G X 5/16" 0.5 ML MISC USE DAILY AS DIRECTED. DX E11.9 100 each 1  . DULoxetine (CYMBALTA) 30 MG capsule Take 1 capsule (30 mg total) by mouth daily.    [name] Fexofenadine HCl (ALLEGRA ALLERGY PO) Take 1 tablet by mouth daily as needed.    [name]  glucose blood (ONE TOUCH ULTRA TEST) test strip USE TO TEST GLUCOSE LEVELS 4 (FOUR) TIMES DAILY. 400 each 3  . ibuprofen (ADVIL,MOTRIN) 200 MG tablet Take 400 mg by mouth every 6 (six) hours as needed.     . insulin detemir (LEVEMIR) 100 UNIT/ML injection Inject 0.3 mLs (30 Units total) into the skin at bedtime. 10 mL 5  . insulin lispro (HUMALOG) 100 UNIT/ML injection Inject 0.15 mLs (15 Units total) into the skin 3 (three) times daily with meals.    [name] levofloxacin (LEVAQUIN) 750 MG tablet Take 1 tablet (750 mg total) by mouth daily. 7 tablet 0  . lisinopril (ZESTRIL) 2.5 MG tablet TAKE 1  TABLET BY MOUTH DAILY 90 tablet 2  . metFORMIN (GLUCOPHAGE) 500 MG tablet TAKE 1 TABLET (500 MG TOTAL) BY MOUTH 2 (TWO) TIMES DAILY WITH A MEAL. 180 tablet 1  . metoprolol tartrate (LOPRESSOR) 100 MG tablet TAKE 0.5 TABLETS (50 MG TOTAL) BY MOUTH 2 (TWO) TIMES DAILY. 90 tablet 1  . Omega-3 Fatty Acids (FISH OIL) 1200 MG CAPS Take 2 capsules by mouth 3 (three) times daily with meals.    . rosuvastatin (CRESTOR) 10 MG tablet TAKE 1 TABLET (10 MG TOTAL) BY MOUTH DAILY. 90 tablet 3   No current facility-administered medications for this visit.     VITAL SIGNS: There were no vitals taken for this visit. There were no vitals filed for this visit.  Estimated body mass index is 26.7 kg/m as calculated from the following:   Height as of 11/02/18: 6' (1.829 m).   Weight as of an earlier encounter on 11/09/18: 196 lb 14.4 oz (89.3 kg).  LABS: CBC:    Component Value Date/Time   WBC 18.9 (H) 11/09/2018 0833   HGB 11.0 (L) 11/09/2018 0833   HCT 33.5 (L) 11/09/2018 0833   PLT 581 (H) 11/09/2018 0833   MCV 89.8 11/09/2018 0833   NEUTROABS 15.4 (H) 11/09/2018 0833   LYMPHSABS 0.8 11/09/2018 0833   MONOABS 1.6 (H) 11/09/2018 0833   EOSABS 0.4 11/09/2018 0833   BASOSABS 0.2 (H) 11/09/2018 0833   Comprehensive Metabolic Panel:    Component Value Date/Time   NA 131 (L) 11/09/2018 0833   K 3.5 11/09/2018 0833   CL 94 (L) 11/09/2018 0833   CO2 26 11/09/2018 0833   BUN 12 11/09/2018 0833   CREATININE 0.77 11/09/2018 0833   GLUCOSE 361 (H) 11/09/2018 0833   CALCIUM 8.0 (L) 11/09/2018 0833   AST 12 (L) 11/09/2018 0833   ALT 9 11/09/2018 0833   ALKPHOS 63 11/09/2018 0833   BILITOT 0.8 11/09/2018 0833   PROT 6.5 11/09/2018 0833   ALBUMIN 2.3 (L) 11/09/2018 0833    RADIOGRAPHIC STUDIES: Dg Chest 2 View  Result Date: 11/02/2018 CLINICAL DATA:  Pleural effusion with pleural catheter. EXAM: CHEST - 2 VIEW COMPARISON:  10/15/2018 FINDINGS: Evidence of patient's PleurX catheter over the left  base. Stable to slightly worse moderate size left pleural effusion likely with associated basilar atelectasis. Patchy opacification over the left lung which may be due to atelectasis or infection. Right lung is clear. Cardiomediastinal silhouette and remainder of the exam is unchanged. IMPRESSION: 1. Stable to slightly worse moderate size left pleural effusion likely with associated basilar atelectasis. PleurX drainage catheter present over the left base. 2. Slight worsening hazy patchy opacification over the left lung which may be due to atelectasis or infection. Electronically Signed   By: Marin Olp M.D.   On: 11/02/2018 09:23   Dg  Chest 2 View  Result Date: 10/15/2018 CLINICAL DATA:  Follow-up for a left pleural effusion. EXAM: CHEST - 2 VIEW COMPARISON:  Chest radiograph dated 10/04/2018. FINDINGS: The heart size and mediastinal contours are not significantly changed. A left-sided thoracostomy tube terminates near the medial left lung apex, unchanged. A small a moderate left pleural effusion with associated atelectasis/airspace disease is not significantly changed. The right lung is clear without pleural effusion. There is no pneumothorax. Median sternotomy wires are redemonstrated. Soft tissue gas overlying the left chest and axilla has decreased. IMPRESSION: Unchanged left-sided thoracostomy tube with unchanged left pleural effusion and associated atelectasis/airspace disease. Electronically Signed   By: Zerita Boers M.D.   On: 10/15/2018 14:57   Ct Angio Chest Pe W Or Wo Contrast  Result Date: 11/02/2018 CLINICAL DATA:  History of Lung cancer.  Rule out pulmonary embolus. EXAM: CT ANGIOGRAPHY CHEST WITH CONTRAST TECHNIQUE: Multidetector CT imaging of the chest was performed using the standard protocol during bolus administration of intravenous contrast. Multiplanar CT image reconstructions and MIPs were obtained to evaluate the vascular anatomy. CONTRAST:  37m OMNIPAQUE IOHEXOL 350 MG/ML SOLN  COMPARISON:  PET-CT from 09/18/2018 FINDINGS: Cardiovascular: Normal heart size. Mild pericardial thickening identified. Aortic atherosclerosis. Previous CABG. Main pulmonary artery appears patent. No saddle embolus. No central obstructing pulmonary artery filling defects noted. No lobar or segmental pulmonary artery filling defects identified. Mediastinum/Nodes: The trachea appears patent and is midline. Normal appearance of the esophagus. No mediastinal, axillary adenopathy. New left supraclavicular lymph node measures 0.9 cm, image 19/5. Lungs/Pleura: Moderate changes of centrilobular emphysema. Small right pleural effusion is new from previous exam. Interval placement of left-sided chest with decrease in volume of previous large loculated left pleural effusion. Unfortunately, there is been significant interval increase in pleural thickening with enhancement overlying the entire left lung concerning for pleural spread of tumor. Pleural based tumor along the paramediastinal left upper lobe has a thickness of 2.9 cm, image 145/6. With 0.8 cm previously. There is new diffuse interstitial thickening throughout the left lung concerning for lymphangitic spread of disease. Right lung appears hyperinflated but relatively clear. Upper Abdomen: Stones identified within the gallbladder. No acute abnormality. Musculoskeletal: No chest wall abnormality. No acute or significant osseous findings. Review of the MIP images confirms the above findings. IMPRESSION: 1. No evidence for acute pulmonary embolus. 2. Interval placement of left-sided chest tube with significant decrease in volume of loculated left pleural effusion. Unfortunately, there is been marked increase in tumor rind encasing the entire left lung concerning for progression of disease. There is also been new diffuse increased interstitial thickening throughout the left lung worrisome for lymphangitic spread of disease. Aortic Atherosclerosis (ICD10-I70.0) and  Emphysema (ICD10-J43.9). Electronically Signed   By: TKerby MoorsM.D.   On: 11/02/2018 14:16    PERFORMANCE STATUS (ECOG) : 2 - Symptomatic, <50% confined to bed  Review of Systems Unless otherwise noted, a complete review of systems is negative.  Physical Exam General: NAD, frail appearing Pulmonary: Unlabored Extremities: no edema, no joint deformities Skin: no rashes Neurological: Weakness but otherwise nonfocal  IMPRESSION: I met with patient in the infusion area.  I introduced palliative care services and attempted establish therapeutic rapport.  Patient's severe hearing impairment and my inability to speak loud enough impacted my ability to have a meaningful conversation regarding goals.  Patient said that he felt he was doing reasonably well today.  He has persistent shortness of breath, particularly with exertion.  This limits his ability to care for  himself in the home.  He reports being mostly in the chair during the day.  His wife is his primary caregiver.  At patient's request, I called and spoke with his wife.  We will plan to meet at time of his next clinic visit to further discuss goals.  She says that hospice has been discussed several times but patient has not understood that hospice is a resource available in the home and necessarily a facility where you go for your final days of life.  Wife would be interested in more conversations with patients regarding his goals.  We also discussed establishing advanced directives.  PLAN: -Continue current scope of treatment -We will plan to meet with patient and wife when he returns to the clinic to see Dr. Tasia Catchings   Patient expressed understanding and was in agreement with this plan. He also understands that He can call the clinic at any time with any questions, concerns, or complaints.     Time Total: 30 minutes  Visit consisted of counseling and education dealing with the complex and emotionally intense issues of symptom  management and palliative care in the setting of serious and potentially life-threatening illness.Greater than 50%  of this time was spent counseling and coordinating care related to the above assessment and plan.  Signed by: Altha Harm, PhD, NP-C

## 2018-11-11 ENCOUNTER — Encounter: Payer: Self-pay | Admitting: Family Medicine

## 2018-11-11 NOTE — Assessment & Plan Note (Signed)
DM2, improved but with weight loss likely related to cancer.  Hypoglycemia cautions d/w pt.  Will plan on recheck in 3 months but if any sig changes in the meantime, then patient will update me.  He agrees with plan.

## 2018-11-11 NOTE — Assessment & Plan Note (Signed)
I'll await oncology notes.  D/w pt about palliative care and I asked him to consider this.  He is going to continue with his treatment plan as is for now, "taking it one day at a time" and he'll update me as needed.    I thanked him for taking the call.

## 2018-11-12 ENCOUNTER — Telehealth: Payer: Self-pay

## 2018-11-12 NOTE — Telephone Encounter (Signed)
Nutrition  Patient identified on Malnutrition Screening report for weight loss and poor po intake.  Called and left message for patient with call back number.  Wilburn Keir B. Zenia Resides, [place], [place] Registered Dietitian [phone] (pager)

## 2018-11-13 ENCOUNTER — Encounter: Payer: Self-pay | Admitting: Oncology

## 2018-11-13 ENCOUNTER — Telehealth: Payer: Self-pay | Admitting: Pharmacist

## 2018-11-13 NOTE — Telephone Encounter (Signed)
Oral Oncology Pharmacist Encounter   Received notification from [employer] that prior authorizations for Mekinist and [name] are required.   PAs submitted on CMM  Keys: - Mekinist: [id number] - Tafinlar: [id number] Status is pending   Oral Oncology Clinic will continue to follow.   Darl Pikes, PharmD, BCPS, [facility] Hematology/Oncology Clinical Pharmacist [facility] [place] [facility] [phone]  11/13/2018 4:27 PM

## 2018-11-13 NOTE — Telephone Encounter (Signed)
Oral Oncology Pharmacist Encounter   Prior Authorizations for Mekinist and [name] have been approved.     PA#s: - Mekinist: [id number] [name]: [id number] Effective dates: 11/13/2018 through 01/03/2020   Oral Oncology Clinic will continue to follow.   Darl Pikes, PharmD, BCPS. [facility] Hematology/Oncology Clinical Pharmacist [facility] Oral [facility] [phone]  11/13/2018 4:34 PM

## 2018-11-14 ENCOUNTER — Other Ambulatory Visit: Payer: Self-pay | Admitting: Oncology

## 2018-11-14 ENCOUNTER — Encounter: Payer: Self-pay | Admitting: Oncology

## 2018-11-14 ENCOUNTER — Telehealth: Payer: Self-pay | Admitting: Pharmacist

## 2018-11-14 DIAGNOSIS — C3490 Malignant neoplasm of unspecified part of unspecified bronchus or lung: Secondary | ICD-10-CM

## 2018-11-14 MED ORDER — TRAMETINIB DIMETHYL SULFOXIDE 2 MG PO TABS: 2.0000 mg | ORAL_TABLET | Freq: Every day | ORAL | 3 refills | Status: DC

## 2018-11-14 MED ORDER — DABRAFENIB MESYLATE 75 MG PO CAPS: 150.0000 mg | ORAL_CAPSULE | Freq: Two times a day (BID) | ORAL | 3 refills | Status: DC

## 2018-11-14 NOTE — Telephone Encounter (Signed)
Oral Oncology Pharmacist Encounter  Received new prescription for Mekinist (trametinib) and Tafinlar (dabrafenib) for the treatment of BRAF V600E mutation positive metastatic lung adenocarcinoma, planned duration until disease progression or unacceptable drug toxicity.  ECHO from 08/24/2018 showed a normal systolic function with a EF of 55-60%. Dose and frequency assessed for Mekinist and Tafinlar.   Current medication list in Epic reviewed, some DDIs with antidiabetic agents and dabrafenib identified: -Dabrafenib is associated with hyperglycemia, monitor for changes in hyperglycemia management. No baseline adjustments needed.  Prescription has been e-scribed to the [facility] for benefits analysis and approval.  Oral Oncology Clinic will continue to follow for insurance authorization, copayment issues, initial counseling and start date.  Darl Pikes, PharmD, BCPS, [facility] Hematology/Oncology Clinical Pharmacist [facility] Oral [place] [facility] [phone]  11/14/2018 10:41 AM

## 2018-11-16 ENCOUNTER — Ambulatory Visit (INDEPENDENT_AMBULATORY_CARE_PROVIDER_SITE_OTHER): Payer: Medicare Other | Admitting: Cardiothoracic Surgery

## 2018-11-16 ENCOUNTER — Telehealth: Payer: Self-pay | Admitting: Pharmacist

## 2018-11-16 ENCOUNTER — Ambulatory Visit
Admission: RE | Admit: 2018-11-16 | Discharge: 2018-11-16 | Disposition: A | Discharge status: Home / Self Care | Payer: Medicare Other | Attending: Cardiothoracic Surgery | Admitting: Cardiothoracic Surgery

## 2018-11-16 ENCOUNTER — Encounter: Payer: Self-pay | Admitting: Oncology

## 2018-11-16 ENCOUNTER — Encounter: Payer: Self-pay | Admitting: Cardiothoracic Surgery

## 2018-11-16 ENCOUNTER — Other Ambulatory Visit: Payer: Self-pay

## 2018-11-16 ENCOUNTER — Inpatient Hospital Stay (HOSPITAL_BASED_OUTPATIENT_CLINIC_OR_DEPARTMENT_OTHER): Payer: Medicare Other | Admitting: Oncology

## 2018-11-16 ENCOUNTER — Inpatient Hospital Stay (HOSPITAL_BASED_OUTPATIENT_CLINIC_OR_DEPARTMENT_OTHER): Payer: Medicare Other | Admitting: Hospice and Palliative Medicine

## 2018-11-16 ENCOUNTER — Encounter: Payer: Self-pay | Admitting: Pharmacy Technician

## 2018-11-16 ENCOUNTER — Ambulatory Visit
Admission: RE | Admit: 2018-11-16 | Discharge: 2018-11-16 | Disposition: A | Discharge status: Home / Self Care | Payer: Medicare Other | Source: Ambulatory Visit | Attending: Cardiothoracic Surgery | Admitting: Cardiothoracic Surgery

## 2018-11-16 ENCOUNTER — Inpatient Hospital Stay: Payer: Medicare Other

## 2018-11-16 VITALS — BP 114/64 | HR 87 | Temp 97.2°F | Resp 18 | Wt 197.5 lb

## 2018-11-16 VITALS — BP 95/63 | HR 94 | Temp 97.6°F | Ht 72.0 in | Wt 195.0 lb

## 2018-11-16 DIAGNOSIS — Z7189 Other specified counseling: Secondary | ICD-10-CM | POA: Diagnosis not present

## 2018-11-16 DIAGNOSIS — C3490 Malignant neoplasm of unspecified part of unspecified bronchus or lung: Secondary | ICD-10-CM

## 2018-11-16 DIAGNOSIS — J90 Pleural effusion, not elsewhere classified: Secondary | ICD-10-CM

## 2018-11-16 DIAGNOSIS — C3410 Malignant neoplasm of upper lobe, unspecified bronchus or lung: Secondary | ICD-10-CM | POA: Diagnosis not present

## 2018-11-16 DIAGNOSIS — Z5111 Encounter for antineoplastic chemotherapy: Secondary | ICD-10-CM | POA: Diagnosis not present

## 2018-11-16 DIAGNOSIS — Z515 Encounter for palliative care: Secondary | ICD-10-CM

## 2018-11-16 LAB — COMPREHENSIVE METABOLIC PANEL
ALT: 8 U/L (ref 0–44)
AST: 16 U/L (ref 15–41)
Albumin: 2.4 g/dL — ABNORMAL LOW (ref 3.5–5.0)
Alkaline Phosphatase: 69 U/L (ref 38–126)
Anion gap: 6 (ref 5–15)
BUN: 10 mg/dL (ref 8–23)
CO2: 27 mmol/L (ref 22–32)
Calcium: 8.2 mg/dL — ABNORMAL LOW (ref 8.9–10.3)
Chloride: 102 mmol/L (ref 98–111)
Creatinine, Ser: 0.8 mg/dL (ref 0.61–1.24)
GFR calc Af Amer: >60 mL/min (ref >60)
GFR calc non Af Amer: >60 mL/min (ref >60)
Glucose, Bld: 278 mg/dL — ABNORMAL HIGH (ref 70–99)
Potassium: 4.3 mmol/L (ref 3.5–5.1)
Sodium: 135 mmol/L (ref 135–145)
Total Bilirubin: 0.3 mg/dL (ref 0.3–1.2)
Total Protein: 6.2 g/dL — ABNORMAL LOW (ref 6.5–8.1)

## 2018-11-16 LAB — CBC WITH DIFFERENTIAL/PLATELET
Abs Immature Granulocytes: 0.06 10*3/uL (ref 0.00–0.07)
Basophils Absolute: 0.1 10*3/uL (ref 0.0–0.1)
Basophils Relative: 1 %
Eosinophils Absolute: 0.3 10*3/uL (ref 0.0–0.5)
Eosinophils Relative: 3 %
HCT: 31.4 % — ABNORMAL LOW (ref 39.0–52.0)
Hemoglobin: 10.1 g/dL — ABNORMAL LOW (ref 13.0–17.0)
Immature Granulocytes: 1 %
Lymphocytes Relative: 10 %
Lymphs Abs: 1 10*3/uL (ref 0.7–4.0)
MCH: 29.3 pg (ref 26.0–34.0)
MCHC: 32.2 g/dL (ref 30.0–36.0)
MCV: 91 fL (ref 80.0–100.0)
Monocytes Absolute: 1 10*3/uL (ref 0.1–1.0)
Monocytes Relative: 10 %
Neutro Abs: 7.4 10*3/uL (ref 1.7–7.7)
Neutrophils Relative %: 75 %
Platelets: 520 10*3/uL — ABNORMAL HIGH (ref 150–400)
RBC: 3.45 MIL/uL — ABNORMAL LOW (ref 4.22–5.81)
RDW: 13.7 % (ref 11.5–15.5)
WBC: 9.8 10*3/uL (ref 4.0–10.5)
nRBC: 0 % (ref 0.0–0.2)

## 2018-11-16 MED ORDER — ONDANSETRON HCL 8 MG PO TABS: 8.0000 mg | ORAL_TABLET | Freq: Three times a day (TID) | ORAL | 0 refills | Status: DC | PRN

## 2018-11-16 NOTE — Progress Notes (Signed)
Patient has been approved for drug assistance by [employer] for [employer]. The enrollment period is from 10/19/18-01/03/19 based on off label. First DOS covered is 10/19/18.

## 2018-11-16 NOTE — Progress Notes (Signed)
Patient had an appointment with Dr. Genevive Bi this morning.  Patient is feeling fatigued with SOBr on exertion is unchanged.  Doesn't have much of an appetite but he does eat.

## 2018-11-16 NOTE — Progress Notes (Signed)
  He returns today in follow-up.  He and his wife have attempted to drain there Pleurx catheter on several occasions since he was last seen without any success.  He states that he has been feeling considerably better over the last month with less shortness of breath.  He did get a chest x-ray today which have independently reviewed.  That shows continued opacity within the left hemithorax.  I assume some of this is loculated pleural fluid as well as tumor.  Overall it looks essentially unchanged over the last several weeks.  He has diminished breath sounds on the left.  In addition this entrance site of the catheter looks quite clean.  We did attempt to drain it one last time today and we could not get anything out of the catheter.  Therefore I explained to the patient and his wife the indications and risks of removal of his Pleurx catheter.  We proceeded to perform that here in our office.  The area around the wound was prepped and draped in usual sterile fashion.  Using 1% lidocaine as a local anesthetic the entrance site was anesthetized.  A slightly larger incision was made extending superiorly and the catheter was dissected from the surrounding soft tissues.  It was then removed in total.  The wounds were hemostatic and a single suture of 3-0 nylon was used to approximate very loosely the edges.  The patient will come back in 1 week to have the suture removed.  He tolerated procedure well without any complications.

## 2018-11-16 NOTE — Telephone Encounter (Signed)
Oral Chemotherapy Pharmacist Encounter  Mekinist/Tafinlar application submitted online on 11/15/2018 in an effort to reduce patient's out of pocket expense $0.    Application also included a 30 days free trial option.  This encounter will be updated until final determination.   Darl Pikes, PharmD, BCPS, [facility] Hematology/Oncology Clinical Pharmacist [facility] Oral [place] [facility] [phone]  11/16/2018 10:45 AM

## 2018-11-16 NOTE — Telephone Encounter (Signed)
Oral Chemotherapy Pharmacist Encounter   Received a call from [name] letting me know that the manufacturer assistance program for Mekinist/Tafinlar reached out to him to set-up the 30 day free trial shipment.  Mekinist and [name] will be delivered to [name] today 11/16/2018.  He has an appt with Dr. Tasia Catchings today. Notified Dr. Tasia Catchings about the medication delivery.  Will continue to follow full assistance approval.  Darl Pikes, PharmD, BCPS, [facility] Hematology/Oncology Clinical Pharmacist [facility] [place] [facility] [phone]  11/16/2018 10:49 AM

## 2018-11-16 NOTE — Telephone Encounter (Signed)
Oral Chemotherapy Pharmacist Encounter   Mekinist and Tafinlar copays: Mekinist: [phone]  Tafinlar: $2,124.11  We will need to apply for patient assistance. Will continue to follow.  Darl Pikes, PharmD, BCPS, [facility] Hematology/Oncology Clinical Pharmacist [facility] [place] [facility] [phone]  11/16/2018 10:39 AM

## 2018-11-16 NOTE — Patient Instructions (Signed)
May remove your dressing Sunday evening before your shower. Wash with soap and water and rinse well. Pat dry and may place a bandage over the area for as long as it is open or draining.   Follow up next with with the nurse for suture removal.  Follow up with Dr Genevive Bi as needed.

## 2018-11-16 NOTE — Progress Notes (Signed)
[place]  [phone] [phone]   Name: Craig Koch Date: 11/16/2018 MRN: [id number]  DOB: 07-08-1955  Patient Care Team: Tonia Ghent, MD as PCP - General (Family Medicine) Thelma Comp, [place] (Optometry) Telford Nab, RN as Registered Nurse    REASON FOR CONSULTATION: Palliative Care consult requested for this 63 y.o. male with multiple medical problems including stage IV adenocarcinoma of the lung with history of left sided malignant pleural effusion requiring Pleurx.  Patient has opted not to pursue chemotherapy and has instead been treated on immunotherapy.  Patient has had persistent shortness of breath.  CTA of the chest on 11/02/2018 revealed marked increase in tumor encasing the entire left lung with new diffuse increased interstitial thickening concerning for disease progression and lymphangitic spread of disease.  Patient was referred to palliative care to help address goals and manage ongoing symptoms.  SOCIAL HISTORY:     reports that he has been smoking cigarettes. He has a 9.00 pack-year smoking history. He has quit using smokeless tobacco.  His smokeless tobacco use included snuff. He reports current alcohol use of about 6.0 standard drinks of alcohol per week. He reports that he does not use drugs.   Patient is married.  He lives at home with his wife.  He has a son and daughter who live nearby.  Patient used to work as a Theme park manager.  ADVANCE DIRECTIVES:  Not on file  CODE STATUS:   PAST MEDICAL HISTORY: Past Medical History:  Diagnosis Date  . Anxiety   . Arthritis   . Cancer ([place])    lung  . Coronary artery disease   . Diabetes mellitus   . Dyslipidemia   . Hx of CABG   . Hyperlipidemia   . Hypertension   . Pleural effusion     PAST SURGICAL HISTORY:  Past Surgical History:  Procedure Laterality Date  . CATARACT EXTRACTION Left 09/2015  . CHEST TUBE INSERTION Left 10/01/2018    Procedure: INSERTION PLEURAL DRAINAGE CATHETER;  Surgeon: [name], MD;  Location: [facility] ORS;  Service: Thoracic;  Laterality: Left;  . CORONARY ARTERY BYPASS GRAFT  2004   (CABG with LIMA to the  LAD, SVG to OM2/OM3, SVG  to diag  . Left ankle surgery     repair of fracture  . Right lower leg surgery     rod    HEMATOLOGY/ONCOLOGY HISTORY:  Oncology History  lung adenocarcinoma  09/08/2018 Initial Diagnosis   lung adenocarcinoma   10/19/2018 -  Chemotherapy   The patient had pembrolizumab (KEYTRUDA) 200 mg in sodium chloride 0.9 % 50 mL chemo infusion, 200 mg, Intravenous, Once, 2 of 8 cycles Administration: 200 mg (10/19/2018), 200 mg (11/09/2018)  for chemotherapy treatment.      ALLERGIES:  is allergic to lipitor [atorvastatin calcium].  MEDICATIONS:  Current Outpatient Medications  Medication Sig Dispense Refill  . albuterol (VENTOLIN HFA) 108 (90 Base) MCG/ACT inhaler Inhale 2 puffs into the lungs every 6 (six) hours as needed for wheezing or shortness of breath. 18 g 0  . aspirin 325 MG tablet Take 325 mg by mouth daily.    . BD INSULIN SYRINGE U/F 31G X 5/16" 0.5 ML MISC USE DAILY AS DIRECTED. DX E11.9 100 each 1  . dabrafenib mesylate (TAFINLAR) 75 MG capsule Take 2 capsules (150 mg total) by mouth 2 (two) times daily. Take on an empty stomach 1 hour before or 2 hours after meals. (Patient not taking:  Reported on 11/16/2018) 120 capsule 3  . dronabinol (MARINOL) 5 MG capsule Take 1 capsule (5 mg total) by mouth 2 (two) times daily before a meal. (Patient not taking: Reported on 11/16/2018) 60 capsule 0  . DULoxetine (CYMBALTA) 30 MG capsule Take 1 capsule (30 mg total) by mouth daily.    [name] Fexofenadine HCl (ALLEGRA ALLERGY PO) Take 1 tablet by mouth daily as needed.    [name] glucose blood (ONE TOUCH ULTRA TEST) test strip USE TO TEST GLUCOSE LEVELS 4 (FOUR) TIMES DAILY. 400 each 3  . ibuprofen (ADVIL,MOTRIN) 200 MG tablet Take 400 mg by mouth every 6 (six) hours as needed.      . insulin detemir (LEVEMIR) 100 UNIT/ML injection Inject 0.3 mLs (30 Units total) into the skin at bedtime. 10 mL 5  . insulin lispro (HUMALOG) 100 UNIT/ML injection Inject 0.15 mLs (15 Units total) into the skin 3 (three) times daily with meals.    [name] lisinopril (ZESTRIL) 2.5 MG tablet TAKE 1 TABLET BY MOUTH DAILY 90 tablet 2  . metFORMIN (GLUCOPHAGE) 500 MG tablet TAKE 1 TABLET (500 MG TOTAL) BY MOUTH 2 (TWO) TIMES DAILY WITH A MEAL. 180 tablet 1  . metoprolol tartrate (LOPRESSOR) 100 MG tablet TAKE 0.5 TABLETS (50 MG TOTAL) BY MOUTH 2 (TWO) TIMES DAILY. 90 tablet 1  . rosuvastatin (CRESTOR) 10 MG tablet TAKE 1 TABLET (10 MG TOTAL) BY MOUTH DAILY. 90 tablet 3  . trametinib dimethyl sulfoxide (MEKINIST) 2 MG tablet Take 1 tablet (2 mg total) by mouth daily. Take 1 hour before or 2 hours after a meal. Store refrigerated in original container. (Patient not taking: Reported on 11/16/2018) 30 tablet 3   No current facility-administered medications for this visit.     VITAL SIGNS: There were no vitals taken for this visit. There were no vitals filed for this visit.  Estimated body mass index is 26.79 kg/m as calculated from the following:   Height as of an earlier encounter on 11/16/18: 6' (1.829 m).   Weight as of an earlier encounter on 11/16/18: 197 lb 8 oz (89.6 kg).  LABS: CBC:    Component Value Date/Time   WBC 9.8 11/16/2018 1435   HGB 10.1 (L) 11/16/2018 1435   HCT 31.4 (L) 11/16/2018 1435   PLT 520 (H) 11/16/2018 1435   MCV 91.0 11/16/2018 1435   NEUTROABS 7.4 11/16/2018 1435   LYMPHSABS 1.0 11/16/2018 1435   MONOABS 1.0 11/16/2018 1435   EOSABS 0.3 11/16/2018 1435   BASOSABS 0.1 11/16/2018 1435   Comprehensive Metabolic Panel:    Component Value Date/Time   NA 135 11/16/2018 1435   K 4.3 11/16/2018 1435   CL 102 11/16/2018 1435   CO2 27 11/16/2018 1435   BUN 10 11/16/2018 1435   CREATININE 0.80 11/16/2018 1435   GLUCOSE 278 (H) 11/16/2018 1435   CALCIUM 8.2 (L)  11/16/2018 1435   AST 16 11/16/2018 1435   ALT 8 11/16/2018 1435   ALKPHOS 69 11/16/2018 1435   BILITOT 0.3 11/16/2018 1435   PROT 6.2 (L) 11/16/2018 1435   ALBUMIN 2.4 (L) 11/16/2018 1435    RADIOGRAPHIC STUDIES: Dg Chest 2 View  Result Date: 11/16/2018 CLINICAL DATA:  Follow-up pleural effusion.  11/02/2018 EXAM: CHEST - 2 VIEW COMPARISON:  11/02/2018. FINDINGS: Previous median sternotomy and CABG procedure. Normal heart size. There is asymmetric left lung volume loss. No change in left pleural effusion with left chest tube in place. No pneumothorax identified. Interstitial and airspace opacities within the  aerated portions of the left lung appear unchanged. Right lung is hyperinflated but clear. IMPRESSION: 1. Stable position of left chest tube. No change in left pleural effusion and aeration in the left lung. Electronically Signed   By: Kerby Moors M.D.   On: 11/16/2018 08:34   Dg Chest 2 View  Result Date: 11/02/2018 CLINICAL DATA:  Pleural effusion with pleural catheter. EXAM: CHEST - 2 VIEW COMPARISON:  10/15/2018 FINDINGS: Evidence of patient's PleurX catheter over the left base. Stable to slightly worse moderate size left pleural effusion likely with associated basilar atelectasis. Patchy opacification over the left lung which may be due to atelectasis or infection. Right lung is clear. Cardiomediastinal silhouette and remainder of the exam is unchanged. IMPRESSION: 1. Stable to slightly worse moderate size left pleural effusion likely with associated basilar atelectasis. PleurX drainage catheter present over the left base. 2. Slight worsening hazy patchy opacification over the left lung which may be due to atelectasis or infection. Electronically Signed   By: Marin Olp M.D.   On: 11/02/2018 09:23   Ct Angio Chest Pe W Or Wo Contrast  Result Date: 11/02/2018 CLINICAL DATA:  History of Lung cancer.  Rule out pulmonary embolus. EXAM: CT ANGIOGRAPHY CHEST WITH CONTRAST TECHNIQUE:  Multidetector CT imaging of the chest was performed using the standard protocol during bolus administration of intravenous contrast. Multiplanar CT image reconstructions and MIPs were obtained to evaluate the vascular anatomy. CONTRAST:  93mL OMNIPAQUE IOHEXOL 350 MG/ML SOLN COMPARISON:  PET-CT from 09/18/2018 FINDINGS: Cardiovascular: Normal heart size. Mild pericardial thickening identified. Aortic atherosclerosis. Previous CABG. Main pulmonary artery appears patent. No saddle embolus. No central obstructing pulmonary artery filling defects noted. No lobar or segmental pulmonary artery filling defects identified. Mediastinum/Nodes: The trachea appears patent and is midline. Normal appearance of the esophagus. No mediastinal, axillary adenopathy. New left supraclavicular lymph node measures 0.9 cm, image 19/5. Lungs/Pleura: Moderate changes of centrilobular emphysema. Small right pleural effusion is new from previous exam. Interval placement of left-sided chest with decrease in volume of previous large loculated left pleural effusion. Unfortunately, there is been significant interval increase in pleural thickening with enhancement overlying the entire left lung concerning for pleural spread of tumor. Pleural based tumor along the paramediastinal left upper lobe has a thickness of 2.9 cm, image 145/6. With 0.8 cm previously. There is new diffuse interstitial thickening throughout the left lung concerning for lymphangitic spread of disease. Right lung appears hyperinflated but relatively clear. Upper Abdomen: Stones identified within the gallbladder. No acute abnormality. Musculoskeletal: No chest wall abnormality. No acute or significant osseous findings. Review of the MIP images confirms the above findings. IMPRESSION: 1. No evidence for acute pulmonary embolus. 2. Interval placement of left-sided chest tube with significant decrease in volume of loculated left pleural effusion. Unfortunately, there is been marked  increase in tumor rind encasing the entire left lung concerning for progression of disease. There is also been new diffuse increased interstitial thickening throughout the left lung worrisome for lymphangitic spread of disease. Aortic Atherosclerosis (ICD10-I70.0) and Emphysema (ICD10-J43.9). Electronically Signed   By: Kerby Moors M.D.   On: 11/02/2018 14:16    PERFORMANCE STATUS (ECOG) : 2 - Symptomatic, <50% confined to bed  Review of Systems Unless otherwise noted, a complete review of systems is negative.  Physical Exam General: NAD, frail appearing Pulmonary: Unlabored Extremities: no edema, no joint deformities Skin: no rashes Neurological: Weakness but otherwise nonfocal  IMPRESSION: Routine follow-up visit today in the clinic.  Patient's wife accompanied  him today.  Patient denies any significant changes or concerns today.  He continues to have some shortness of breath exertionally.  Patient saw Dr. Genevive Bi and had his Pleurx catheter removed earlier today.  There is concerned that the pleural effusion was loculated.  Patient says that the tube was inhibiting his ability to sleep adequately as he likes to sleep on his left side.  Patient denies any pain.  He reports that he has occasional nausea.  He reports good oral intake.  Will order oral ondansetron as needed.  Patient has decided to pursue targeted treatment.  Case discussed with Dr. Tasia Catchings.    I reviewed with patient a MOST Form, which he took home to discuss with his wife.  PLAN: -Continue current scope of treatment -Ondansetron 8 mg p.o. every 8 hours as needed for nausea -MOST Form reviewed -Follow-up telephone visit in 3 weeks   Patient expressed understanding and was in agreement with this plan. He also understands that He can call the clinic at any time with any questions, concerns, or complaints.     Time Total: 15 minutes  Visit consisted of counseling and education dealing with the complex and emotionally  intense issues of symptom management and palliative care in the setting of serious and potentially life-threatening illness.Greater than 50%  of this time was spent counseling and coordinating care related to the above assessment and plan.  Signed by: Altha Harm, PhD, NP-C

## 2018-11-17 NOTE — Progress Notes (Signed)
Hematology/Oncologyfollow up  note [facility] Telephone:(336) [phone] Fax:([phone]) [phone]   Patient Care Team: Tonia Ghent, MD as PCP - General (Family Medicine) Thelma Comp, [place] (Optometry) Telford Nab, RN as Registered Nurse  REFERRING PROVIDER: Tonia Ghent, MD  CHIEF COMPLAINTS/REASON FOR VISIT:  Follow-up for metastatic lung adenocarcinoma  HISTORY OF PRESENTING ILLNESS:   ROSALIE Koch is a  63 y.o.  male with PMH listed below was seen in consultation at the request of  Tonia Ghent, MD  for evaluation of malignant pleural effusion. Patient was recently admitted to [facility] due to large amount of pleural effusion, acute hypoxic respiratory failure. Patient reports feeling shortness of breath and dyspnea on exertion for several weeks and presented to emergency room.  A chest x-ray 08/23/2018 showed a large amount of left pleural effusion 08/24/2018 CT chest abdomen pelvis confirmed large left pleural effusion with near complete collapse of the left lower lobe and extensive volume loss in the lingula and a portion of the left upper lobe. No acute intra-abdominal process.  Cholelithiasis, diverticulitis, coronary atherosclerosis post CABG.  Bilateral renal cortical scarring.  Aortic atherosclerosis.  #08/24/2018 ultrasound guided diagnostic and therapeutic thoracentesis removed 1.8 L of pleural fluid. Patient subsequently felt better and was discharged to follow-up with pulmonology. 09/04/2018 patient followed up with Dr.Aleskerov chest x-ray showed recurrent moderate left pleural effusion.  Patient underwent ultrasound-guided left thoracentesis again and it drained 3 L of fluid. Pleural fluid cytology positive for adenocarcinoma. Patient was referred to me for further evaluation and management.  # PET eft sided pleural hypermetabolic activity  consistent with malignant pleural effusion. Left upper and lower lobe low-level hypermetabolic him  corresponding to areas of airspace and groundglass opacity.  This was felt to be secondary to atelectasis.  Underlying left upper lobe primary bronchogenic carcinoma cannot be excluded. Medial left upper lobe area more focal hypermetabolic area could represent a pleural disease or a site of small left upper lobe primary. No evidence of extra thoracic hypermetabolic metastatic disease.  Left sided hydro-pneumothorax within the pleural air component being new since the prior CT. Cholelithiasis.  #MRI brain showed punctate focus of enhancement within the right parietal lobe cortex suspicious for possible metastasis.   INTERVAL HISTORY Craig Koch is a 63 y.o. 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  Problems and complaints are listed below: Status post 2 cycles of immunotherapy with Keytruda.  Patient adamantly declines chemotherapy. Pleurx was taken out by Dr. Genevive Bi recently. Patient continues to feel fatigued and tired.  Shortness of breath no change. Appetite is poor, he does eat.  His weight is stable since last visit.. No hemoptysis  Molecular study showed BRAF V600 mutation.  Review of Systems  Constitutional: Positive for appetite change, fatigue and unexpected weight change. Negative for chills and fever.  HENT:   Negative for hearing loss and voice change.   Eyes: Negative for eye problems and icterus.  Respiratory: Positive for cough and shortness of breath. Negative for chest tightness.   Cardiovascular: Negative for chest pain and leg swelling.  Gastrointestinal: Negative for abdominal distention and abdominal pain.  Endocrine: Negative for hot flashes.  Genitourinary: Negative for difficulty urinating, dysuria and frequency.   Musculoskeletal: Negative for arthralgias.  Skin: Negative for itching and rash.  Neurological: Negative for light-headedness and numbness.    Hematological: Negative for adenopathy. Does not bruise/bleed easily.  Psychiatric/Behavioral: Negative for confusion.    MEDICAL HISTORY:  Past Medical History:  Diagnosis Date   Anxiety    Arthritis    Cancer ([place])    lung   Coronary artery disease    Diabetes mellitus    Dyslipidemia    Hx of CABG    Hyperlipidemia    Hypertension    Pleural effusion     SURGICAL HISTORY: Past Surgical History:  Procedure Laterality Date   CATARACT EXTRACTION Left 09/2015   CHEST TUBE INSERTION Left 10/01/2018   Procedure: INSERTION PLEURAL DRAINAGE CATHETER;  Surgeon: [name], MD;  Location: [facility] ORS;  Service: Thoracic;  Laterality: Left;   CORONARY ARTERY BYPASS GRAFT  2004   (CABG with LIMA to the  LAD, SVG to OM2/OM3, SVG  to diag   Left ankle surgery     repair of fracture   Right lower leg surgery     rod   SOCIAL HISTORY: Social History   Socioeconomic History   Marital status: Married    Spouse name: Not on file   Number of children: Not on file   Years of education: Not on file   Highest education level: Not on file  Occupational History   Not on file  Social Needs   Financial resource strain: Not on file   Food insecurity    Worry: Not on file    Inability: Not on file   Transportation needs    Medical: Not on file    Non-medical: Not on file  Tobacco Use   Smoking status: Current Every Day Smoker    Packs/day: 0.20    Years: 45.00    Pack years: 9.00    Types: Cigarettes   Smokeless tobacco: Former Systems developer    Types: Snuff  Substance and Sexual Activity   Alcohol use: Yes    Alcohol/week: 6.0 standard drinks    Types: 6 Cans of beer per week    Comment: occ, average 6 pack in a week   Drug use: No   Sexual activity: Never  Lifestyle   Physical activity    Days per week: Not on file    Minutes per session: Not on file   Stress: Not on file  Relationships   Social connections    Talks on phone: Not on file     Gets together: Not on file    Attends religious service: Not on file    Active member of club or organization: Not on file    Attends meetings of clubs or organizations: Not on file    Relationship status: Not on file   Intimate partner violence    Fear of current or ex partner: Not on file    Emotionally abused: Not on file    Physically abused: Not on file    Forced sexual activity: Not on file  Other Topics Concern   Not on file  Social History Narrative   On disability 2009 after prev injuries and CAD.     Married 1976   2 kids, 4 grandkids.      FAMILY HISTORY: Family History  Problem Relation Age of Onset   Dementia Mother    Heart disease Father    Colon cancer Neg Hx    Prostate cancer Neg Hx    Diabetes Neg Hx    ALLERGIES:  is allergic to lipitor [atorvastatin calcium].  MEDICATIONS: PHYSICAL EXAMINATION: Current Outpatient Medications on File Prior to Visit  Medication Sig Dispense Refill  albuterol (VENTOLIN HFA) 108 (90 Base) MCG/ACT inhaler Inhale 2 puffs into the lungs every 6 (six) hours as needed for wheezing or shortness of breath. 18 g 0   BD INSULIN SYRINGE U/F 31G X 5/16" 0.5 ML MISC USE DAILY AS DIRECTED. DX E11.9 100 each 1   DULoxetine (CYMBALTA) 30 MG capsule Take 1 capsule (30 mg total) by mouth daily.     Fexofenadine HCl (ALLEGRA ALLERGY PO) Take 1 tablet by mouth daily as needed.     glucose blood (ONE TOUCH ULTRA TEST) test strip USE TO TEST GLUCOSE LEVELS 4 (FOUR) TIMES DAILY. 400 each 3   insulin detemir (LEVEMIR) 100 UNIT/ML injection Inject 0.3 mLs (30 Units total) into the skin at bedtime. 10 mL 5   insulin lispro (HUMALOG) 100 UNIT/ML injection Inject 0.15 mLs (15 Units total) into the skin 3 (three) times daily with meals.     lisinopril (ZESTRIL) 2.5 MG tablet TAKE 1 TABLET BY MOUTH DAILY 90 tablet 2   metFORMIN (GLUCOPHAGE) 500 MG tablet TAKE 1 TABLET (500 MG TOTAL) BY MOUTH 2 (TWO) TIMES DAILY WITH A MEAL. 180 tablet 1    metoprolol tartrate (LOPRESSOR) 100 MG tablet TAKE 0.5 TABLETS (50 MG TOTAL) BY MOUTH 2 (TWO) TIMES DAILY. 90 tablet 1   rosuvastatin (CRESTOR) 10 MG tablet TAKE 1 TABLET (10 MG TOTAL) BY MOUTH DAILY. 90 tablet 3   aspirin 325 MG tablet Take 325 mg by mouth daily.     dabrafenib mesylate (TAFINLAR) 75 MG capsule Take 2 capsules (150 mg total) by mouth 2 (two) times daily. Take on an empty stomach 1 hour before or 2 hours after meals. (Patient not taking: Reported on 11/16/2018) 120 capsule 3   dronabinol (MARINOL) 5 MG capsule Take 1 capsule (5 mg total) by mouth 2 (two) times daily before a meal. (Patient not taking: Reported on 11/16/2018) 60 capsule 0   ibuprofen (ADVIL,MOTRIN) 200 MG tablet Take 400 mg by mouth every 6 (six) hours as needed.      trametinib dimethyl sulfoxide (MEKINIST) 2 MG tablet Take 1 tablet (2 mg total) by mouth daily. Take 1 hour before or 2 hours after a meal. Store refrigerated in original container. (Patient not taking: Reported on 11/16/2018) 30 tablet 3   No current facility-administered medications on file prior to visit.     ECOG PERFORMANCE STATUS:2  Today's Vitals   11/16/18 1454  BP: 114/64  Pulse: 87  Resp: 18  Temp: (!) 97.2 F (36.2 C)  SpO2: 96%  Weight: 197 lb 8 oz (89.6 kg)  PainSc: 0-No pain   Body mass index is 26.79 kg/m.  Physical Exam Constitutional:      General: He is not in acute distress.    Appearance: He is ill-appearing.     Comments: Cachectic  HENT:     Head: Normocephalic and atraumatic.  Eyes:     General: No scleral icterus.    Pupils: Pupils are equal, round, and reactive to light.  Neck:     Musculoskeletal: Normal range of motion and neck supple.  Cardiovascular:     Rate and Rhythm: Regular rhythm. Tachycardia present.     Heart sounds: Normal heart sounds.  Pulmonary:     Effort: Pulmonary effort is normal. No respiratory distress.     Breath sounds: No wheezing.     Comments: Decreased breathing  sounds left lower lobe.   Abdominal:     General: Bowel sounds are normal. There is no distension.  Palpations: Abdomen is soft. There is no mass.     Tenderness: There is no abdominal tenderness.  Musculoskeletal: Normal range of motion.        General: No deformity.  Skin:    General: Skin is warm and dry.     Findings: No erythema or rash.  Neurological:     Mental Status: He is alert and oriented to person, place, and time.     Cranial Nerves: No cranial nerve deficit.     Coordination: Coordination normal.  Psychiatric:        Behavior: Behavior normal.        Thought Content: Thought content normal.     LABORATORY DATA:  I have reviewed the data as listed Lab Results  Component Value Date   WBC 18.9 (H) 11/09/2018   HGB 11.0 (L) 11/09/2018   HCT 33.5 (L) 11/09/2018   MCV 89.8 11/09/2018   PLT 581 (H) 11/09/2018   Recent Labs    10/19/18 0841 11/02/18 1238 11/09/18 0833  NA 137 132* 131*  K 4.0 4.0 3.5  CL 104 95* 94*  CO2 _0 GLUCOSE 252* 425* 361*  BUN _1 CREATININE 0.61 0.84 0.77  CALCIUM 8.5* 8.5* 8.0*  GFRNONAA >60 >60 >60  GFRAA >60 >60 >60  PROT 6.0* 7.0 6.5  ALBUMIN 3.0* 2.6* 2.3*  AST 14* 11* 12*  ALT _2 ALKPHOS 69 75 63  BILITOT 0.5 0.9 0.8   Iron/TIBC/Ferritin/ %Sat No results found for: IRON, TIBC, FERRITIN, IRONPCTSAT    RADIOGRAPHIC STUDIES: I have personally reviewed the radiological images as listed and agreed with the findings in the report.   Dg Chest 2 View  Result Date: 11/16/2018 CLINICAL DATA:  Follow-up pleural effusion.  11/02/2018 EXAM: CHEST - 2 VIEW COMPARISON:  11/02/2018. FINDINGS: Previous median sternotomy and CABG procedure. Normal heart size. There is asymmetric left lung volume loss. No change in left pleural effusion with left chest tube in place. No pneumothorax identified. Interstitial and airspace opacities within the aerated portions of the left lung appear unchanged. Right lung is  hyperinflated but clear. IMPRESSION: 1. Stable position of left chest tube. No change in left pleural effusion and aeration in the left lung. Electronically Signed   By: Kerby Moors M.D.   On: 11/16/2018 08:34   Dg Chest 2 View  Result Date: 11/02/2018 CLINICAL DATA:  Pleural effusion with pleural catheter. EXAM: CHEST - 2 VIEW COMPARISON:  10/15/2018 FINDINGS: Evidence of patient's PleurX catheter over the left base. Stable to slightly worse moderate size left pleural effusion likely with associated basilar atelectasis. Patchy opacification over the left lung which may be due to atelectasis or infection. Right lung is clear. Cardiomediastinal silhouette and remainder of the exam is unchanged. IMPRESSION: 1. Stable to slightly worse moderate size left pleural effusion likely with associated basilar atelectasis. PleurX drainage catheter present over the left base. 2. Slight worsening hazy patchy opacification over the left lung which may be due to atelectasis or infection. Electronically Signed   By: Marin Olp M.D.   On: 11/02/2018 09:23   Dg Chest 2 View  Result Date: 10/15/2018 CLINICAL DATA:  Follow-up for a left pleural effusion. EXAM: CHEST - 2 VIEW COMPARISON:  Chest radiograph dated 10/04/2018. FINDINGS: The heart size and mediastinal contours are not significantly changed. A left-sided thoracostomy tube terminates near the medial left lung apex, unchanged. A small a moderate left pleural effusion with associated atelectasis/airspace disease is  not significantly changed. The right lung is clear without pleural effusion. There is no pneumothorax. Median sternotomy wires are redemonstrated. Soft tissue gas overlying the left chest and axilla has decreased. IMPRESSION: Unchanged left-sided thoracostomy tube with unchanged left pleural effusion and associated atelectasis/airspace disease. Electronically Signed   By: Zerita Boers M.D.   On: 10/15/2018 14:57   Dg Chest 2 View  Result Date:  10/04/2018 CLINICAL DATA:  Left lung cancer, follow-up chest tube EXAM: CHEST - 2 VIEW COMPARISON:  10/02/2018 FINDINGS: Left-sided hydropneumothorax with left-sided chest tube and slight interval increase in a small left pleural effusion component with associated extensive diffuse interstitial pulmonary opacity and partial atelectasis of the left lung. There remains a small, approximately 10% left pneumothorax, possibly with a trapped left lung. The right lung is normally aerated. Increased subcutaneous emphysema about the left chest wall and neck. Status post median sternotomy and CABG. IMPRESSION: 1. Left-sided hydropneumothorax with left-sided chest tube and slight interval increase in a small left pleural effusion component with associated extensive diffuse interstitial pulmonary opacity and partial atelectasis of the left lung. 2. There remains a small, approximately 10% left pneumothorax, possibly with a trapped left lung. 3. Increased subcutaneous emphysema about the left chest wall and neck. Electronically Signed   By: Eddie Candle M.D.   On: 10/04/2018 13:28   Chest 2 View  Result Date: 09/28/2018 CLINICAL DATA:  63 year old male under preoperative evaluation prior to scheduled surgery. Pleural effusion. Shortness of breath. EXAM: CHEST - 2 VIEW COMPARISON:  Chest x-ray 09/13/2018.  PET-CT 09/18/2018. FINDINGS: Large left-sided hydropneumothorax again noted, with small pneumothorax component in the apex of the left hemithorax and large pleural fluid component. No aerated left lung is identified, presumably secondary to underlying atelectasis. The right lung is clear. No right pleural effusion. No evidence of pulmonary edema. Cardiac silhouette is largely obscured. Status post median sternotomy for CABG. IMPRESSION: 1. Large left hydropneumothorax with probable complete atelectasis of the left lung. The hydropneumothorax is similar to recent PET-CT, although the degree of atelectasis in the left lung  has increased. Electronically Signed   By: Vinnie Langton M.D.   On: 09/28/2018 16:44   Dg Chest 2 View  Result Date: 09/07/2018 CLINICAL DATA:  Decreased breath sounds in the left lower lobe. EXAM: CHEST - 2 VIEW COMPARISON:  September 05, 2018 FINDINGS: There is a persistent but improving left lower lobe airspace opacity. There is a persistent moderate left-sided pleural effusion. There is no pneumothorax. There is generalized volume overload. The patient is status post prior median sternotomy. The heart size is stable. There is elevation of the left hemidiaphragm. IMPRESSION: 1. Moderate left-sided pleural effusion. 2. Persistent airspace opacity involving the left lower lobe favored to represent atelectasis with an infiltrate not excluded. 3. No pneumothorax. Electronically Signed   By: Constance Holster M.D.   On: 09/07/2018 15:01   Dg Chest 2 View  Result Date: 08/30/2018 CLINICAL DATA:  Follow-up pleural effusion. EXAM: CHEST - 2 VIEW COMPARISON:  Chest radiographs 08/24/2018, 08/23/2018 FINDINGS: Stable cardiomediastinal contours status post median sternotomy and CABG. The right lung is clear. There are bandlike opacities in the left mid lung likely representing atelectasis. Persistent moderate sized left pleural effusion. No pneumothorax. No acute osseous abnormality in the visualized skeleton. IMPRESSION: Persistent moderate size left pleural effusion and associated left lung atelectasis. Electronically Signed   By: Audie Pinto M.D.   On: 08/30/2018 20:37   Dg Chest 2 View  Result Date: 08/23/2018 CLINICAL DATA:  Shortness of breath 2 weeks with productive cough. EXAM: CHEST - 2 VIEW COMPARISON:  None. FINDINGS: There is moderate opacification over the lower half of the left lung likely large effusion with associated basilar atelectasis. Fluid tracks into the apex. Right lung is clear. Left heart border is obscured by the large left effusion. Visualized mediastinum is unremarkable.  Degenerative change of the spine. IMPRESSION: Large left pleural effusion likely with associated basilar atelectasis. Electronically Signed   By: Marin Olp M.D.   On: 08/23/2018 19:13   Ct Chest W Contrast  Result Date: 08/24/2018 CLINICAL DATA:  Chest pain, shortness of breath, fusion suspected. Smoker, approximately 25 pack years EXAM: CT CHEST AND ABDOMEN WITH CONTRAST TECHNIQUE: Multidetector CT imaging of the chest and abdomen was performed following the standard protocol during bolus administration of intravenous contrast. CONTRAST:  133m OMNIPAQUE IOHEXOL 300 MG/ML  SOLN COMPARISON:  Same-day radiograph FINDINGS: CT CHEST FINDINGS Cardiovascular: Postsurgical changes related to prior CABG. Extensive calcification of the native coronary arteries. Heart is normal size. No pericardial effusion. Atherosclerotic plaque within the normal caliber aorta. Pulmonary arteries are normal caliber. Mediastinum/Nodes: Small amount stranding in the mediastinum likely related to prior CABG. Subcentimeter hypoattenuating nodule in the left thyroid lobe (2/3). Trachea is patent. Esophagus is unremarkable. Lungs/Pleura: There is a large left pleural effusion with near complete collapse of the left lower lobe and extensive volume loss in the lingula and portions of the left upper lobe as well. There is abrupt tapering of the tracheobronchial tree is inter is into the airless portions of lung. Some ground-glass attenuation is noted adjacent to the fusion likely reflecting passive atelectatic change. In the right lung is clear. No pneumothorax. No visible nodules or suspicious masses though these are not fully excluded in the regions of airless lung. Musculoskeletal: Postsurgical changes from prior sternotomy with intact and aligned sutures. Multilevel degenerative changes are present in the imaged portions of the spine. Additional degenerative changes in the shoulders. No acute or suspicious osseous lesion. No chest wall  abnormality. CT ABDOMEN FINDINGS Hepatobiliary: No focal liver lesion. Few calcified gallstones layer dependently within the gallbladder. No biliary ductal dilatation. Pancreas: Unremarkable. No pancreatic ductal dilatation or surrounding inflammatory changes. Spleen: Normal in size without focal abnormality. Adrenals/Urinary Tract: Normal adrenal glands. Mild bilateral nonspecific perinephric stranding, a nonspecific finding though may correlate with either age or decreased renal function. Lobular appearance of the kidneys may reflect cortical scarring. Kidneys are otherwise unremarkable, without renal calculi, suspicious lesion, or hydronephrosis. Bladder is unremarkable. Stomach/Bowel: Distal esophagus, stomach and duodenal sweep are unremarkable. No bowel wall thickening or dilatation. No evidence of obstruction. A normal appendix is visualized. Scattered colonic diverticula without focal pericolonic inflammation to suggest diverticulitis. Vascular/Lymphatic: Extensive atheromatous plaque throughout the aorta and branch vessels. [place] of the renal arteries are heavily diseased of the branches opacify normally. Normal opacification of the portal venous Other: No abdominopelvic free fluid or free gas. No bowel containing hernias. Small amount of soft tissue infiltration in the low anterior abdomen. Musculoskeletal: Multilevel degenerative changes are present in the imaged portions of the spine. Few remote appearing compression deformities in the lumbar spine at L2, L4 and L5 as well as a limbus vertebrae at L3 could correlate for point tenderness however IMPRESSION: 1. Large left pleural effusion with near complete collapse of the left lower lobe and extensive volume loss in the lingula and portions of the left upper lobe. Underlying consolidation or lesion cannot be excluded. 2. No acute intra-abdominal process.  3. Cholelithiasis without evidence of acute cholecystitis. 4. Diverticulosis without evidence of acute  diverticulitis. 5. Coronary atherosclerosis, post CABG. 6. Bilateral renal cortical scarring 7. Aortic Atherosclerosis (ICD10-I70.0). Electronically Signed   By: Lovena Le M.D.   On: 08/24/2018 00:19   Ct Angio Chest Pe W Or Wo Contrast  Result Date: 11/02/2018 CLINICAL DATA:  History of Lung cancer.  Rule out pulmonary embolus. EXAM: CT ANGIOGRAPHY CHEST WITH CONTRAST TECHNIQUE: Multidetector CT imaging of the chest was performed using the standard protocol during bolus administration of intravenous contrast. Multiplanar CT image reconstructions and MIPs were obtained to evaluate the vascular anatomy. CONTRAST:  72m OMNIPAQUE IOHEXOL 350 MG/ML SOLN COMPARISON:  PET-CT from 09/18/2018 FINDINGS: Cardiovascular: Normal heart size. Mild pericardial thickening identified. Aortic atherosclerosis. Previous CABG. Main pulmonary artery appears patent. No saddle embolus. No central obstructing pulmonary artery filling defects noted. No lobar or segmental pulmonary artery filling defects identified. Mediastinum/Nodes: The trachea appears patent and is midline. Normal appearance of the esophagus. No mediastinal, axillary adenopathy. New left supraclavicular lymph node measures 0.9 cm, image 19/5. Lungs/Pleura: Moderate changes of centrilobular emphysema. Small right pleural effusion is new from previous exam. Interval placement of left-sided chest with decrease in volume of previous large loculated left pleural effusion. Unfortunately, there is been significant interval increase in pleural thickening with enhancement overlying the entire left lung concerning for pleural spread of tumor. Pleural based tumor along the paramediastinal left upper lobe has a thickness of 2.9 cm, image 145/6. With 0.8 cm previously. There is new diffuse interstitial thickening throughout the left lung concerning for lymphangitic spread of disease. Right lung appears hyperinflated but relatively clear. Upper Abdomen: Stones identified within  the gallbladder. No acute abnormality. Musculoskeletal: No chest wall abnormality. No acute or significant osseous findings. Review of the MIP images confirms the above findings. IMPRESSION: 1. No evidence for acute pulmonary embolus. 2. Interval placement of left-sided chest tube with significant decrease in volume of loculated left pleural effusion. Unfortunately, there is been marked increase in tumor rind encasing the entire left lung concerning for progression of disease. There is also been new diffuse increased interstitial thickening throughout the left lung worrisome for lymphangitic spread of disease. Aortic Atherosclerosis (ICD10-I70.0) and Emphysema (ICD10-J43.9). Electronically Signed   By: TKerby MoorsM.D.   On: 11/02/2018 14:16   Mr BJeri CosWVOContrast  Result Date: 09/21/2018 CLINICAL DATA:  Malignant neoplasm of unspecified part of unspecified bronchus or lung. Lung cancer, non-small cell, staging EXAM: MRI HEAD WITHOUT AND WITH CONTRAST TECHNIQUE: Multiplanar, multiecho pulse sequences of the brain and surrounding structures were obtained without and with intravenous contrast. CONTRAST:  911mGADAVIST GADOBUTROL 1 MMOL/ML IV SOLN COMPARISON:  No pertinent prior studies available for comparison. FINDINGS: Brain: A punctate focus of enhancement within the right parietal lobe cortex is suspicious for possible metastasis (series 18, image 114). No other foci of abnormal intracranial enhancement are identified. There is no acute infarct. No midline shift or extra-axial fluid collection. No chronic intracranial hemorrhage. Mild scattered T2/FLAIR hyperintensity within the cerebral white matter and brainstem is nonspecific, but consistent with chronic small vessel ischemic disease. Mild generalized parenchymal atrophy. Vascular: Flow voids maintained within the proximal large arterial vessels. Skull and upper cervical spine: Normal marrow signal. Sinuses/Orbits: Imaged globes and orbits demonstrate  no acute abnormality. Mild mucosal thickening within a small right maxillary sinus. No significant mastoid effusion. IMPRESSION: Punctate focus of enhancement within the right parietal lobe cortex suspicious for possible metastasis. Three-month contrast-enhanced MRI  follow-up is recommended. Mild generalized parenchymal atrophy and chronic small vessel ischemic disease. Right maxillary sinus disease as described. Electronically Signed   By: Kellie Simmering   On: 09/21/2018 10:00   Ct Abdomen Pelvis W Contrast  Result Date: 08/24/2018 CLINICAL DATA:  Chest pain, shortness of breath, fusion suspected. Smoker, approximately 25 pack years EXAM: CT CHEST AND ABDOMEN WITH CONTRAST TECHNIQUE: Multidetector CT imaging of the chest and abdomen was performed following the standard protocol during bolus administration of intravenous contrast. CONTRAST:  171m OMNIPAQUE IOHEXOL 300 MG/ML  SOLN COMPARISON:  Same-day radiograph FINDINGS: CT CHEST FINDINGS Cardiovascular: Postsurgical changes related to prior CABG. Extensive calcification of the native coronary arteries. Heart is normal size. No pericardial effusion. Atherosclerotic plaque within the normal caliber aorta. Pulmonary arteries are normal caliber. Mediastinum/Nodes: Small amount stranding in the mediastinum likely related to prior CABG. Subcentimeter hypoattenuating nodule in the left thyroid lobe (2/3). Trachea is patent. Esophagus is unremarkable. Lungs/Pleura: There is a large left pleural effusion with near complete collapse of the left lower lobe and extensive volume loss in the lingula and portions of the left upper lobe as well. There is abrupt tapering of the tracheobronchial tree is inter is into the airless portions of lung. Some ground-glass attenuation is noted adjacent to the fusion likely reflecting passive atelectatic change. In the right lung is clear. No pneumothorax. No visible nodules or suspicious masses though these are not fully excluded in the  regions of airless lung. Musculoskeletal: Postsurgical changes from prior sternotomy with intact and aligned sutures. Multilevel degenerative changes are present in the imaged portions of the spine. Additional degenerative changes in the shoulders. No acute or suspicious osseous lesion. No chest wall abnormality. CT ABDOMEN FINDINGS Hepatobiliary: No focal liver lesion. Few calcified gallstones layer dependently within the gallbladder. No biliary ductal dilatation. Pancreas: Unremarkable. No pancreatic ductal dilatation or surrounding inflammatory changes. Spleen: Normal in size without focal abnormality. Adrenals/Urinary Tract: Normal adrenal glands. Mild bilateral nonspecific perinephric stranding, a nonspecific finding though may correlate with either age or decreased renal function. Lobular appearance of the kidneys may reflect cortical scarring. Kidneys are otherwise unremarkable, without renal calculi, suspicious lesion, or hydronephrosis. Bladder is unremarkable. Stomach/Bowel: Distal esophagus, stomach and duodenal sweep are unremarkable. No bowel wall thickening or dilatation. No evidence of obstruction. A normal appendix is visualized. Scattered colonic diverticula without focal pericolonic inflammation to suggest diverticulitis. Vascular/Lymphatic: Extensive atheromatous plaque throughout the aorta and branch vessels. [place] of the renal arteries are heavily diseased of the branches opacify normally. Normal opacification of the portal venous Other: No abdominopelvic free fluid or free gas. No bowel containing hernias. Small amount of soft tissue infiltration in the low anterior abdomen. Musculoskeletal: Multilevel degenerative changes are present in the imaged portions of the spine. Few remote appearing compression deformities in the lumbar spine at L2, L4 and L5 as well as a limbus vertebrae at L3 could correlate for point tenderness however IMPRESSION: 1. Large left pleural effusion with near complete  collapse of the left lower lobe and extensive volume loss in the lingula and portions of the left upper lobe. Underlying consolidation or lesion cannot be excluded. 2. No acute intra-abdominal process. 3. Cholelithiasis without evidence of acute cholecystitis. 4. Diverticulosis without evidence of acute diverticulitis. 5. Coronary atherosclerosis, post CABG. 6. Bilateral renal cortical scarring 7. Aortic Atherosclerosis (ICD10-I70.0). Electronically Signed   By: PLovena LeM.D.   On: 08/24/2018 00:19   Nm Pet Image Initial (pi) Skull Base To Thigh  Result  Date: 09/18/2018 CLINICAL DATA:  Initial treatment strategy for lung nodule. Staging. Left-sided thoracentesis demonstrating adenocarcinoma EXAM: NUCLEAR MEDICINE PET SKULL BASE TO THIGH TECHNIQUE: 11.5 mCi F-18 FDG was injected intravenously. Full-ring PET imaging was performed from the skull base to thigh after the radiotracer. CT data was obtained and used for attenuation correction and anatomic localization. Fasting blood glucose: 129 mg/dl COMPARISON:  08/24/2018 chest abdomen and pelvic CTs. FINDINGS: Mediastinal blood pool activity: SUV max 2.3 Liver activity: SUV max NA NECK: No areas of abnormal hypermetabolism. Incidental CT findings: Mucosal thickening right maxillary sinus. Bilateral carotid atherosclerosis. No cervical adenopathy. CHEST: Low-level hypermetabolism corresponding to areas of patchy consolidation within the central left upper and less so medial left lower lobe. Example within the central left upper lobe relatively diffusely at a S.U.V. max of 5.2 including on image 105/3. The most focal area of hypermetabolism and nodularity is along the pleural surface adjacent the medial right upper lobe. This measures on the order of a S.U.V. max of 5.5, including on image 90/3. Multifocal parietal pleural hypermetabolism, including corresponding to nodularity at 1.1 cm and a S.U.V. max of 4.1 on 87/3. Incidental CT findings: Left-sided  hydropneumothorax. The pleural air component is small to moderate. The amount of left-sided pleural fluid is relatively similar to the CT of 08/23/2018. Emphysema. Aortic and coronary artery atherosclerosis with prior median sternotomy. ABDOMEN/PELVIS: No abdominopelvic parenchymal or nodal hypermetabolism. Incidental CT findings: Normal adrenal glands. Left renal vascular calcification. Dependent gallstone or stones. Abdominal aortic atherosclerosis. SKELETON: No abnormal marrow activity. Incidental CT findings: none IMPRESSION: 1. Left-sided pleural hypermetabolism, consistent with malignant pleural effusion. 2. Left upper and less so left lower lobe low-level hypermetabolism corresponding to areas of airspace and ground-glass opacity. This is primarily felt to represent atelectasis. Underlying left upper lobe primary bronchogenic carcinoma cannot be excluded. This is suboptimally evaluated secondary to the extent of volume loss from hydropneumothorax. 3. A medial left upper lobe area of more focal hypermetabolism could also represent pleural disease or a site of a small left upper lobe primary. 4. No evidence of extrathoracic hypermetabolic metastasis. 5. Left-sided hydropneumothorax with the pleural air component being new since the prior CT. 6. Cholelithiasis. These results will be called to the ordering clinician or representative by the Radiologist Assistant, and communication documented in the PACS or zVision Dashboard. Electronically Signed   By: Abigail Miyamoto M.D.   On: 09/18/2018 15:26   Dg Chest Port 1 View  Result Date: 10/02/2018 CLINICAL DATA:  Postop check EXAM: PORTABLE CHEST 1 VIEW COMPARISON:  Yesterday FINDINGS: Tracheal extubation. A left chest tube remains. Moderate left pneumothorax seen at the base more than apex. Small volume pleural fluid at the left base, diminished. The right lung remains clear. Normal heart size. CABG. IMPRESSION: Moderate left pneumothorax after pleural fluid  evacuation. Electronically Signed   By: Monte Fantasia M.D.   On: 10/02/2018 09:29   Dg Chest Port 1 View  Result Date: 10/01/2018 CLINICAL DATA:  Postop left-sided catheter placement for malignant pleural effusion. EXAM: PORTABLE CHEST 1 VIEW COMPARISON:  09/28/2018 FINDINGS: There is a new left-sided tunneled chest tube. One of the sideholes may be straddling the chest wall. There is a large left-sided pleural effusion. The heart size is likely stable but evaluation is limited by the presence of a large pleural effusion. The patient is status post prior median sternotomy. There is no clear pneumothorax. No acute osseous abnormality. IMPRESSION: 1. Interval placement of a tunneled left-sided PleurX catheter. The last  side hole may be straddling the chest wall. This can be further evaluated with oblique imaging. 2. Large left-sided pleural effusion.  No pneumothorax. Electronically Signed   By: Constance Holster M.D.   On: 10/01/2018 09:09   Dg Chest Port 1 View  Result Date: 09/13/2018 CLINICAL DATA:  History of lung cancer now with recurrent symptomatic left-sided pleural effusion, post thoracentesis. EXAM: PORTABLE CHEST 1 VIEW COMPARISON:  09/07/2018 09/05/2018; chest CT-08/23/2018; ultrasound-guided thoracentesis - 09/05/2018 yielding 3 L pleural fluid FINDINGS: Grossly unchanged cardiac silhouette and mediastinal contours post median sternotomy and CABG. Stable postsurgical change of the left hilum. Interval reduction/near resolution of left-sided pleural effusion post large volume left-sided thoracentesis. Improved aeration of left lung base with persistent left perihilar and basilar heterogeneous/consolidative opacities. Mild pulmonary is congestion without frank evidence of edema. The right lung remains well aerated. No pneumothorax. No acute osseous abnormalities. IMPRESSION: 1. Interval reduction/near resolution left-sided effusion post thoracentesis. No pneumothorax. 2. Improved aeration of the  left lung with persistent left perihilar and basilar opacities, likely atelectasis. Electronically Signed   By: Sandi Mariscal M.D.   On: 09/13/2018 09:05   Dg Chest Port 1 View  Result Date: 09/05/2018 CLINICAL DATA:  Status post left thoracentesis EXAM: PORTABLE CHEST 1 VIEW COMPARISON:  08/30/2018 FINDINGS: Cardiac shadow is stable. Postsurgical changes are again noted. Persistent scarring is noted in the left lung base with minimal residual effusion following thoracentesis. No pneumothorax is noted. No bony abnormality is seen. IMPRESSION: No pneumothorax following left-sided thoracentesis. Electronically Signed   By: Inez Catalina M.D.   On: 09/05/2018 12:20   Dg Chest Port 1 View  Result Date: 08/24/2018 CLINICAL DATA:  Status post left thoracentesis EXAM: PORTABLE CHEST 1 VIEW COMPARISON:  08/23/2018 FINDINGS: Cardiac shadow is stable. Postsurgical changes are again noted. The right lung is clear. Left lung demonstrates areas of collapse lung as well as mild residual effusion. No pneumothorax is identified IMPRESSION: No evidence of pneumothorax following thoracentesis. Decreased left pleural effusion is noted. Electronically Signed   By: Inez Catalina M.D.   On: 08/24/2018 16:03   US Thoracentesis Asp Pleural Space W/img Guide  Result Date: 09/13/2018 INDICATION: History of lung cancer, now with recurrent left-sided pleural effusion. Patient presents today for ultrasound-guided left-sided thoracentesis for therapeutic purposes. EXAM: US THORACENTESIS ASP PLEURAL SPACE W/IMG GUIDE COMPARISON:  Previous large volume left-sided thoracentesis performed 09/05/2018 (yielding 3 L) and 08/24/2018 (yielding 1.8 L). MEDICATIONS: None. COMPLICATIONS: None immediate. TECHNIQUE: Informed written consent was obtained from the patient after a discussion of the risks, benefits and alternatives to treatment. A timeout was performed prior to the initiation of the procedure. Initial ultrasound scanning demonstrates a  large recurrent anechoic left-sided pleural effusion. The lower chest was prepped and draped in the usual sterile fashion. 1% lidocaine was used for local anesthesia. An ultrasound image was saved for documentation purposes. An 8 Fr Safe-T-Centesis catheter was introduced. The thoracentesis was performed. The catheter was removed and a dressing was applied. The patient tolerated the procedure well without immediate post procedural complication. The patient was escorted to have an upright chest radiograph. FINDINGS: A total of approximately 3 liters of slightly blood-tinged fluid was removed. IMPRESSION: Successful ultrasound-guided left sided thoracentesis yielding 3 liters of pleural fluid. Electronically Signed   By: Sandi Mariscal M.D.   On: 09/13/2018 09:08   US Thoracentesis Asp Pleural Space W/img Guide  Result Date: 09/05/2018 INDICATION: Left-sided recurrent pleural effusion EXAM: ULTRASOUND GUIDED THORACENTESIS MEDICATIONS: None. COMPLICATIONS: None immediate.  PROCEDURE: An ultrasound guided thoracentesis was thoroughly discussed with the patient and questions answered. The benefits, risks, alternatives and complications were also discussed. The patient understands and wishes to proceed with the procedure. Written consent was obtained. Ultrasound was performed to localize and mark an adequate pocket of fluid in the left chest. The area was then prepped and draped in the normal sterile fashion. 1% Lidocaine was used for local anesthesia. Under ultrasound guidance, a 6 Fr Safe-T-Centesis catheter was introduced. Thoracentesis was performed. The catheter was removed and a dressing applied. FINDINGS: A total of approximately 3 L of sanguinous fluid was removed. Samples were sent to the laboratory as requested by the clinical team. IMPRESSION: Successful ultrasound guided left thoracentesis yielding 3 L of pleural fluid. Electronically Signed   By: Inez Catalina M.D.   On: 09/05/2018 12:21   US Thoracentesis  Asp Pleural Space W/img Guide  Result Date: 08/24/2018 INDICATION: 63 year old with large left pleural effusion and shortness of breath. EXAM: ULTRASOUND GUIDED LEFT THORACENTESIS MEDICATIONS: None. COMPLICATIONS: None immediate. PROCEDURE: An ultrasound guided thoracentesis was thoroughly discussed with the patient and questions answered. The benefits, risks, alternatives and complications were also discussed. The patient understands and wishes to proceed with the procedure. Written consent was obtained. Ultrasound was performed to localize and mark an adequate pocket of fluid in the left chest. The area was then prepped and draped in the normal sterile fashion. 1% Lidocaine was used for local anesthesia. Under ultrasound guidance a 6 Fr Safe-T-Centesis catheter was introduced. Thoracentesis was performed. The catheter was removed and a dressing applied. FINDINGS: A total of approximately 1.8 L of red fluid was removed. Samples were sent to the laboratory as requested by the clinical team. IMPRESSION: Successful ultrasound guided left thoracentesis yielding 1.8 L of pleural fluid. Electronically Signed   By: Markus Daft M.D.   On: 08/24/2018 12:29     ASSESSMENT & PLAN:  1. lung adenocarcinoma   2. Goals of care, counseling/discussion   3. 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 I recommend targeted therapy with dabrafenib and trametinib Rationale of this regimen and the side effects, including but not limited to, cardiac toxicities, skin rash, bleeding, fever, eye inflammation, lung diseases, blood clots, were discussed in details.  Both patient and wife voiced understanding agree with proceeding with dabrafenib 150 mg twice daily and trametinib 2 mg daily.   #Weight loss, poor oral intake.  Lack of appetite.   Recommend Marinol 5 mg twice daily.  Continue follow-up  with palliative care.   All questions were answered. The patient knows to call the clinic with any problems questions or concerns. We spent sufficient time to discuss many aspect of care, questions were answered to patient's satisfaction. Total face to face encounter time for this patient visit was 45 min. >50% of the time was  spent in counseling and coordination of care.    Return of visit:  1 week.  Earlie Server, MD, PhD Hematology Oncology [facility]- [phone] 11/09/2018 [place].

## 2018-11-21 ENCOUNTER — Other Ambulatory Visit: Payer: Self-pay

## 2018-11-21 NOTE — Telephone Encounter (Signed)
Oral Oncology Patient Advocate Encounter  Received notification from [employer] Patient Assistance program that patient has been successfully enrolled into their program to receive [place] from the manufacturer at $0 out of pocket until 01/03/2019.    I called and spoke with patient.  He knows we will have to re-apply.   Patient knows to call the office with questions or concerns.   Oral Oncology Clinic will continue to follow.  [place] [facility] [place] Phone [phone] Fax [phone] 11/21/2018 8:28 AM

## 2018-11-21 NOTE — Progress Notes (Signed)
Patient pre screened for office appointment, no questions or concerns today. Patient reminded of upcoming appointment time and date. 

## 2018-11-22 ENCOUNTER — Other Ambulatory Visit: Payer: Self-pay

## 2018-11-22 ENCOUNTER — Inpatient Hospital Stay: Payer: Medicare Other

## 2018-11-22 ENCOUNTER — Encounter: Payer: Self-pay | Admitting: Oncology

## 2018-11-22 ENCOUNTER — Inpatient Hospital Stay (HOSPITAL_BASED_OUTPATIENT_CLINIC_OR_DEPARTMENT_OTHER): Payer: Medicare Other | Admitting: Oncology

## 2018-11-22 VITALS — BP 117/68 | HR 80 | Temp 95.8°F | Wt 196.8 lb

## 2018-11-22 DIAGNOSIS — C3490 Malignant neoplasm of unspecified part of unspecified bronchus or lung: Secondary | ICD-10-CM

## 2018-11-22 DIAGNOSIS — Z5111 Encounter for antineoplastic chemotherapy: Secondary | ICD-10-CM

## 2018-11-22 DIAGNOSIS — C3410 Malignant neoplasm of upper lobe, unspecified bronchus or lung: Secondary | ICD-10-CM | POA: Diagnosis not present

## 2018-11-22 LAB — CBC WITH DIFFERENTIAL/PLATELET
Abs Immature Granulocytes: 0.08 10*3/uL — ABNORMAL HIGH (ref 0.00–0.07)
Basophils Absolute: 0.1 10*3/uL (ref 0.0–0.1)
Basophils Relative: 1 %
Eosinophils Absolute: 0.4 10*3/uL (ref 0.0–0.5)
Eosinophils Relative: 4 %
HCT: 35.1 % — ABNORMAL LOW (ref 39.0–52.0)
Hemoglobin: 11 g/dL — ABNORMAL LOW (ref 13.0–17.0)
Immature Granulocytes: 1 %
Lymphocytes Relative: 14 %
Lymphs Abs: 1.3 10*3/uL (ref 0.7–4.0)
MCH: 28.3 pg (ref 26.0–34.0)
MCHC: 31.3 g/dL (ref 30.0–36.0)
MCV: 90.2 fL (ref 80.0–100.0)
Monocytes Absolute: 1 10*3/uL (ref 0.1–1.0)
Monocytes Relative: 11 %
Neutro Abs: 6.3 10*3/uL (ref 1.7–7.7)
Neutrophils Relative %: 69 %
Platelets: 512 10*3/uL — ABNORMAL HIGH (ref 150–400)
RBC: 3.89 MIL/uL — ABNORMAL LOW (ref 4.22–5.81)
RDW: 13.8 % (ref 11.5–15.5)
WBC: 9.1 10*3/uL (ref 4.0–10.5)
nRBC: 0 % (ref 0.0–0.2)

## 2018-11-22 LAB — COMPREHENSIVE METABOLIC PANEL
ALT: 10 U/L (ref 0–44)
AST: 12 U/L — ABNORMAL LOW (ref 15–41)
Albumin: 2.8 g/dL — ABNORMAL LOW (ref 3.5–5.0)
Alkaline Phosphatase: 85 U/L (ref 38–126)
Anion gap: 7 (ref 5–15)
BUN: 12 mg/dL (ref 8–23)
CO2: 26 mmol/L (ref 22–32)
Calcium: 8.8 mg/dL — ABNORMAL LOW (ref 8.9–10.3)
Chloride: 102 mmol/L (ref 98–111)
Creatinine, Ser: 0.61 mg/dL (ref 0.61–1.24)
GFR calc Af Amer: >60 mL/min (ref >60)
GFR calc non Af Amer: >60 mL/min (ref >60)
Glucose, Bld: 204 mg/dL — ABNORMAL HIGH (ref 70–99)
Potassium: 4.9 mmol/L (ref 3.5–5.1)
Sodium: 135 mmol/L (ref 135–145)
Total Bilirubin: 0.3 mg/dL (ref 0.3–1.2)
Total Protein: 7.2 g/dL (ref 6.5–8.1)

## 2018-11-22 NOTE — Progress Notes (Signed)
Hematology/Oncologyfollow up  note [facility] [phone] [phone]   Patient Care Team: Tonia Ghent, MD as PCP - General (Family Medicine) Thelma Comp, [place] (Optometry) Telford Nab, RN as Registered Nurse  REFERRING PROVIDER: Tonia Ghent, MD  CHIEF COMPLAINTS/REASON FOR VISIT:  Follow-up for metastatic lung adenocarcinoma  HISTORY OF PRESENTING ILLNESS:   Craig Koch is a  63 y.o.  male with PMH listed below was seen in consultation at the request of  Tonia Ghent, MD  for evaluation of malignant pleural effusion. Patient was recently admitted to [facility] due to large amount of pleural effusion, acute hypoxic respiratory failure. Patient reports feeling shortness of breath and dyspnea on exertion for several weeks and presented to emergency room.  A chest x-ray 08/23/2018 showed a large amount of left pleural effusion 08/24/2018 CT chest abdomen pelvis confirmed large left pleural effusion with near complete collapse of the left lower lobe and extensive volume loss in the lingula and a portion of the left upper lobe. No acute intra-abdominal process.  Cholelithiasis, diverticulitis, coronary atherosclerosis post CABG.  Bilateral renal cortical scarring.  Aortic atherosclerosis.  #08/24/2018 ultrasound guided diagnostic and therapeutic thoracentesis removed 1.8 L of pleural fluid. Patient subsequently felt better and was discharged to follow-up with pulmonology. 09/04/2018 patient followed up with Dr.Aleskerov chest x-ray showed recurrent moderate left pleural effusion.  Patient underwent ultrasound-guided left thoracentesis again and it drained 3 L of fluid. Pleural fluid cytology positive for adenocarcinoma. Patient was referred to me for further evaluation and management.  # PET eft sided pleural hypermetabolic activity  consistent with malignant pleural effusion. Left upper and lower lobe low-level hypermetabolic him  corresponding to areas of airspace and groundglass opacity.  This was felt to be secondary to atelectasis.  Underlying left upper lobe primary bronchogenic carcinoma cannot be excluded. Medial left upper lobe area more focal hypermetabolic area could represent a pleural disease or a site of small left upper lobe primary. No evidence of extra thoracic hypermetabolic metastatic disease.  Left sided hydro-pneumothorax within the pleural air component being new since the prior CT. Cholelithiasis.  #MRI brain showed punctate focus of enhancement within the right parietal lobe cortex suspicious for possible metastasis.  Molecular study showed BRAF V600 mutation.  INTERVAL HISTORY Craig Koch is a 63 y.o. 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  Problems and complaints are listed below: Patient has been started on dabrafenib and trametinib since last Friday. As instructed, he started dabrafenib for 3 days first and added trametinib He reports feeling tired and drained for a few hours after the start of the new medication. Otherwise no new complaints. Shortness of breath remains the same level.  Not worsened. Appetite is slightly better.   Review of Systems  Constitutional: Positive for appetite change, fatigue and unexpected weight change. Negative for chills and fever.  HENT:   Negative for hearing loss and voice change.   Eyes: Negative for eye problems and icterus.  Respiratory: Positive for cough and shortness of breath. Negative for chest tightness.   Cardiovascular: Negative for chest pain and leg swelling.  Gastrointestinal: Negative for abdominal distention and abdominal pain.  Endocrine: Negative for hot flashes.  Genitourinary: Negative for difficulty urinating, dysuria and frequency.   Musculoskeletal: Negative for arthralgias.  Skin: Negative for itching and rash.  Neurological:  Negative for light-headedness and numbness.  Hematological: Negative  for adenopathy. Does not bruise/bleed easily.  Psychiatric/Behavioral: Negative for confusion.    MEDICAL HISTORY:  Past Medical History:  Diagnosis Date   Anxiety    Arthritis    Cancer ([place])    lung   Coronary artery disease    Diabetes mellitus    Dyslipidemia    Hx of CABG    Hyperlipidemia    Hypertension    Pleural effusion     SURGICAL HISTORY: Past Surgical History:  Procedure Laterality Date   CATARACT EXTRACTION Left 09/2015   CHEST TUBE INSERTION Left 10/01/2018   Procedure: INSERTION PLEURAL DRAINAGE CATHETER;  Surgeon: [name], MD;  Location: [facility] ORS;  Service: Thoracic;  Laterality: Left;   CORONARY ARTERY BYPASS GRAFT  2004   (CABG with LIMA to the  LAD, SVG to OM2/OM3, SVG  to diag   Left ankle surgery     repair of fracture   Right lower leg surgery     rod   SOCIAL HISTORY: Social History   Socioeconomic History   Marital status: Married    Spouse name: Not on file   Number of children: Not on file   Years of education: Not on file   Highest education level: Not on file  Occupational History   Not on file  Social Needs   Financial resource strain: Not on file   Food insecurity    Worry: Not on file    Inability: Not on file   Transportation needs    Medical: Not on file    Non-medical: Not on file  Tobacco Use   Smoking status: Current Every Day Smoker    Packs/day: 0.20    Years: 45.00    Pack years: 9.00    Types: Cigarettes   Smokeless tobacco: Former Systems developer    Types: Snuff  Substance and Sexual Activity   Alcohol use: Yes    Alcohol/week: 6.0 standard drinks    Types: 6 Cans of beer per week    Comment: occ, average 6 pack in a week   Drug use: No   Sexual activity: Never  Lifestyle   Physical activity    Days per week: Not on file    Minutes per session: Not on file   Stress: Not on file  Relationships   Social  connections    Talks on phone: Not on file    Gets together: Not on file    Attends religious service: Not on file    Active member of club or organization: Not on file    Attends meetings of clubs or organizations: Not on file    Relationship status: Not on file   Intimate partner violence    Fear of current or ex partner: Not on file    Emotionally abused: Not on file    Physically abused: Not on file    Forced sexual activity: Not on file  Other Topics Concern   Not on file  Social History Narrative   On disability 2009 after prev injuries and CAD.     Married 1976   2 kids, 4 grandkids.      FAMILY HISTORY: Family History  Problem Relation Age of Onset   Dementia Mother    Heart disease Father    Colon cancer Neg Hx    Prostate cancer Neg Hx    Diabetes Neg Hx    ALLERGIES:  is allergic to lipitor [atorvastatin calcium].  MEDICATIONS: PHYSICAL EXAMINATION: Current Outpatient Medications on File Prior to Visit  Medication Sig Dispense Refill   albuterol (VENTOLIN HFA) 108 (90 Base) MCG/ACT inhaler Inhale 2 puffs into the lungs every 6 (six) hours as needed for wheezing or shortness of breath. 18 g 0   BD INSULIN SYRINGE U/F 31G X 5/16" 0.5 ML MISC USE DAILY AS DIRECTED. DX E11.9 100 each 1   dabrafenib mesylate (TAFINLAR) 75 MG capsule Take 2 capsules (150 mg total) by mouth 2 (two) times daily. Take on an empty stomach 1 hour before or 2 hours after meals. 120 capsule 3   dronabinol (MARINOL) 5 MG capsule Take 1 capsule (5 mg total) by mouth 2 (two) times daily before a meal. 60 capsule 0   DULoxetine (CYMBALTA) 30 MG capsule Take 1 capsule (30 mg total) by mouth daily.     glucose blood (ONE TOUCH ULTRA TEST) test strip USE TO TEST GLUCOSE LEVELS 4 (FOUR) TIMES DAILY. 400 each 3   insulin detemir (LEVEMIR) 100 UNIT/ML injection Inject 0.3 mLs (30 Units total) into the skin at bedtime. 10 mL 5   insulin lispro (HUMALOG) 100 UNIT/ML injection Inject 0.15  mLs (15 Units total) into the skin 3 (three) times daily with meals.     lisinopril (ZESTRIL) 2.5 MG tablet TAKE 1 TABLET BY MOUTH DAILY 90 tablet 2   metFORMIN (GLUCOPHAGE) 500 MG tablet TAKE 1 TABLET (500 MG TOTAL) BY MOUTH 2 (TWO) TIMES DAILY WITH A MEAL. 180 tablet 1   metoprolol tartrate (LOPRESSOR) 100 MG tablet TAKE 0.5 TABLETS (50 MG TOTAL) BY MOUTH 2 (TWO) TIMES DAILY. 90 tablet 1   ondansetron (ZOFRAN) 8 MG tablet Take 1 tablet (8 mg total) by mouth every 8 (eight) hours as needed for nausea or vomiting. 45 tablet 0   rosuvastatin (CRESTOR) 10 MG tablet TAKE 1 TABLET (10 MG TOTAL) BY MOUTH DAILY. 90 tablet 3   trametinib dimethyl sulfoxide (MEKINIST) 2 MG tablet Take 1 tablet (2 mg total) by mouth daily. Take 1 hour before or 2 hours after a meal. Store refrigerated in original container. 30 tablet 3   No current facility-administered medications on file prior to visit.     ECOG PERFORMANCE STATUS:2  Today's Vitals   11/22/18 1045  BP: 117/68  Pulse: 80  Temp: (!) 95.8 F (35.4 C)  SpO2: 98%  Weight: 196 lb 12.8 oz (89.3 kg)   Body mass index is 26.69 kg/m.  Physical Exam Constitutional:      General: He is not in acute distress.    Appearance: He is ill-appearing.     Comments: Cachectic  HENT:     Head: Normocephalic and atraumatic.  Eyes:     General: No scleral icterus.    Pupils: Pupils are equal, round, and reactive to light.  Neck:     Musculoskeletal: Normal range of motion and neck supple.  Cardiovascular:     Rate and Rhythm: Regular rhythm. Tachycardia present.     Heart sounds: Normal heart sounds.  Pulmonary:     Effort: Pulmonary effort is normal. No respiratory distress.     Breath sounds: No wheezing.     Comments: Decreased breathing sounds left lower lobe.   Abdominal:     General: Bowel sounds are normal. There is no distension.     Palpations: Abdomen is soft. There is no mass.     Tenderness: There is no abdominal tenderness.    Musculoskeletal: Normal range of motion.        General: No deformity.  Skin:  General: Skin is warm and dry.     Findings: No erythema or rash.  Neurological:     Mental Status: He is alert and oriented to person, place, and time.     Cranial Nerves: No cranial nerve deficit.     Coordination: Coordination normal.  Psychiatric:        Behavior: Behavior normal.        Thought Content: Thought content normal.     LABORATORY DATA:  I have reviewed the data as listed Lab Results  Component Value Date   WBC 18.9 (H) 11/09/2018   HGB 11.0 (L) 11/09/2018   HCT 33.5 (L) 11/09/2018   MCV 89.8 11/09/2018   PLT 581 (H) 11/09/2018   Recent Labs    10/19/18 0841 11/02/18 1238 11/09/18 0833  NA 137 132* 131*  K 4.0 4.0 3.5  CL 104 95* 94*  CO2 _0 GLUCOSE 252* 425* 361*  BUN _1 CREATININE 0.61 0.84 0.77  CALCIUM 8.5* 8.5* 8.0*  GFRNONAA >60 >60 >60  GFRAA >60 >60 >60  PROT 6.0* 7.0 6.5  ALBUMIN 3.0* 2.6* 2.3*  AST 14* 11* 12*  ALT _2 ALKPHOS 69 75 63  BILITOT 0.5 0.9 0.8   Iron/TIBC/Ferritin/ %Sat No results found for: IRON, TIBC, FERRITIN, IRONPCTSAT    RADIOGRAPHIC STUDIES: I have personally reviewed the radiological images as listed and agreed with the findings in the report.   Dg Chest 2 View  Result Date: 11/16/2018 CLINICAL DATA:  Follow-up pleural effusion.  11/02/2018 EXAM: CHEST - 2 VIEW COMPARISON:  11/02/2018. FINDINGS: Previous median sternotomy and CABG procedure. Normal heart size. There is asymmetric left lung volume loss. No change in left pleural effusion with left chest tube in place. No pneumothorax identified. Interstitial and airspace opacities within the aerated portions of the left lung appear unchanged. Right lung is hyperinflated but clear. IMPRESSION: 1. Stable position of left chest tube. No change in left pleural effusion and aeration in the left lung. Electronically Signed   By: Kerby Moors M.D.   On: 11/16/2018  08:34   Dg Chest 2 View  Result Date: 11/02/2018 CLINICAL DATA:  Pleural effusion with pleural catheter. EXAM: CHEST - 2 VIEW COMPARISON:  10/15/2018 FINDINGS: Evidence of patient's PleurX catheter over the left base. Stable to slightly worse moderate size left pleural effusion likely with associated basilar atelectasis. Patchy opacification over the left lung which may be due to atelectasis or infection. Right lung is clear. Cardiomediastinal silhouette and remainder of the exam is unchanged. IMPRESSION: 1. Stable to slightly worse moderate size left pleural effusion likely with associated basilar atelectasis. PleurX drainage catheter present over the left base. 2. Slight worsening hazy patchy opacification over the left lung which may be due to atelectasis or infection. Electronically Signed   By: Marin Olp M.D.   On: 11/02/2018 09:23   Dg Chest 2 View  Result Date: 10/15/2018 CLINICAL DATA:  Follow-up for a left pleural effusion. EXAM: CHEST - 2 VIEW COMPARISON:  Chest radiograph dated 10/04/2018. FINDINGS: The heart size and mediastinal contours are not significantly changed. A left-sided thoracostomy tube terminates near the medial left lung apex, unchanged. A small a moderate left pleural effusion with associated atelectasis/airspace disease is not significantly changed. The right lung is clear without pleural effusion. There is no pneumothorax. Median sternotomy wires are redemonstrated. Soft tissue gas overlying the left chest and axilla has decreased. IMPRESSION: Unchanged left-sided thoracostomy tube with unchanged left  pleural effusion and associated atelectasis/airspace disease. Electronically Signed   By: Zerita Boers M.D.   On: 10/15/2018 14:57   Dg Chest 2 View  Result Date: 10/04/2018 CLINICAL DATA:  Left lung cancer, follow-up chest tube EXAM: CHEST - 2 VIEW COMPARISON:  10/02/2018 FINDINGS: Left-sided hydropneumothorax with left-sided chest tube and slight interval increase in a  small left pleural effusion component with associated extensive diffuse interstitial pulmonary opacity and partial atelectasis of the left lung. There remains a small, approximately 10% left pneumothorax, possibly with a trapped left lung. The right lung is normally aerated. Increased subcutaneous emphysema about the left chest wall and neck. Status post median sternotomy and CABG. IMPRESSION: 1. Left-sided hydropneumothorax with left-sided chest tube and slight interval increase in a small left pleural effusion component with associated extensive diffuse interstitial pulmonary opacity and partial atelectasis of the left lung. 2. There remains a small, approximately 10% left pneumothorax, possibly with a trapped left lung. 3. Increased subcutaneous emphysema about the left chest wall and neck. Electronically Signed   By: Eddie Candle M.D.   On: 10/04/2018 13:28   Chest 2 View  Result Date: 09/28/2018 CLINICAL DATA:  63 year old male under preoperative evaluation prior to scheduled surgery. Pleural effusion. Shortness of breath. EXAM: CHEST - 2 VIEW COMPARISON:  Chest x-ray 09/13/2018.  PET-CT 09/18/2018. FINDINGS: Large left-sided hydropneumothorax again noted, with small pneumothorax component in the apex of the left hemithorax and large pleural fluid component. No aerated left lung is identified, presumably secondary to underlying atelectasis. The right lung is clear. No right pleural effusion. No evidence of pulmonary edema. Cardiac silhouette is largely obscured. Status post median sternotomy for CABG. IMPRESSION: 1. Large left hydropneumothorax with probable complete atelectasis of the left lung. The hydropneumothorax is similar to recent PET-CT, although the degree of atelectasis in the left lung has increased. Electronically Signed   By: Vinnie Langton M.D.   On: 09/28/2018 16:44   Dg Chest 2 View  Result Date: 09/07/2018 CLINICAL DATA:  Decreased breath sounds in the left lower lobe. EXAM: CHEST - 2  VIEW COMPARISON:  September 05, 2018 FINDINGS: There is a persistent but improving left lower lobe airspace opacity. There is a persistent moderate left-sided pleural effusion. There is no pneumothorax. There is generalized volume overload. The patient is status post prior median sternotomy. The heart size is stable. There is elevation of the left hemidiaphragm. IMPRESSION: 1. Moderate left-sided pleural effusion. 2. Persistent airspace opacity involving the left lower lobe favored to represent atelectasis with an infiltrate not excluded. 3. No pneumothorax. Electronically Signed   By: Constance Holster M.D.   On: 09/07/2018 15:01   Dg Chest 2 View  Result Date: 08/30/2018 CLINICAL DATA:  Follow-up pleural effusion. EXAM: CHEST - 2 VIEW COMPARISON:  Chest radiographs 08/24/2018, 08/23/2018 FINDINGS: Stable cardiomediastinal contours status post median sternotomy and CABG. The right lung is clear. There are bandlike opacities in the left mid lung likely representing atelectasis. Persistent moderate sized left pleural effusion. No pneumothorax. No acute osseous abnormality in the visualized skeleton. IMPRESSION: Persistent moderate size left pleural effusion and associated left lung atelectasis. Electronically Signed   By: Audie Pinto M.D.   On: 08/30/2018 20:37   Ct Angio Chest Pe W Or Wo Contrast  Result Date: 11/02/2018 CLINICAL DATA:  History of Lung cancer.  Rule out pulmonary embolus. EXAM: CT ANGIOGRAPHY CHEST WITH CONTRAST TECHNIQUE: Multidetector CT imaging of the chest was performed using the standard protocol during bolus administration of intravenous contrast.  Multiplanar CT image reconstructions and MIPs were obtained to evaluate the vascular anatomy. CONTRAST:  64m OMNIPAQUE IOHEXOL 350 MG/ML SOLN COMPARISON:  PET-CT from 09/18/2018 FINDINGS: Cardiovascular: Normal heart size. Mild pericardial thickening identified. Aortic atherosclerosis. Previous CABG. Main pulmonary artery appears  patent. No saddle embolus. No central obstructing pulmonary artery filling defects noted. No lobar or segmental pulmonary artery filling defects identified. Mediastinum/Nodes: The trachea appears patent and is midline. Normal appearance of the esophagus. No mediastinal, axillary adenopathy. New left supraclavicular lymph node measures 0.9 cm, image 19/5. Lungs/Pleura: Moderate changes of centrilobular emphysema. Small right pleural effusion is new from previous exam. Interval placement of left-sided chest with decrease in volume of previous large loculated left pleural effusion. Unfortunately, there is been significant interval increase in pleural thickening with enhancement overlying the entire left lung concerning for pleural spread of tumor. Pleural based tumor along the paramediastinal left upper lobe has a thickness of 2.9 cm, image 145/6. With 0.8 cm previously. There is new diffuse interstitial thickening throughout the left lung concerning for lymphangitic spread of disease. Right lung appears hyperinflated but relatively clear. Upper Abdomen: Stones identified within the gallbladder. No acute abnormality. Musculoskeletal: No chest wall abnormality. No acute or significant osseous findings. Review of the MIP images confirms the above findings. IMPRESSION: 1. No evidence for acute pulmonary embolus. 2. Interval placement of left-sided chest tube with significant decrease in volume of loculated left pleural effusion. Unfortunately, there is been marked increase in tumor rind encasing the entire left lung concerning for progression of disease. There is also been new diffuse increased interstitial thickening throughout the left lung worrisome for lymphangitic spread of disease. Aortic Atherosclerosis (ICD10-I70.0) and Emphysema (ICD10-J43.9). Electronically Signed   By: TKerby MoorsM.D.   On: 11/02/2018 14:16   Mr BJeri CosWUYContrast  Result Date: 09/21/2018 CLINICAL DATA:  Malignant neoplasm of  unspecified part of unspecified bronchus or lung. Lung cancer, non-small cell, staging EXAM: MRI HEAD WITHOUT AND WITH CONTRAST TECHNIQUE: Multiplanar, multiecho pulse sequences of the brain and surrounding structures were obtained without and with intravenous contrast. CONTRAST:  947mGADAVIST GADOBUTROL 1 MMOL/ML IV SOLN COMPARISON:  No pertinent prior studies available for comparison. FINDINGS: Brain: A punctate focus of enhancement within the right parietal lobe cortex is suspicious for possible metastasis (series 18, image 114). No other foci of abnormal intracranial enhancement are identified. There is no acute infarct. No midline shift or extra-axial fluid collection. No chronic intracranial hemorrhage. Mild scattered T2/FLAIR hyperintensity within the cerebral white matter and brainstem is nonspecific, but consistent with chronic small vessel ischemic disease. Mild generalized parenchymal atrophy. Vascular: Flow voids maintained within the proximal large arterial vessels. Skull and upper cervical spine: Normal marrow signal. Sinuses/Orbits: Imaged globes and orbits demonstrate no acute abnormality. Mild mucosal thickening within a small right maxillary sinus. No significant mastoid effusion. IMPRESSION: Punctate focus of enhancement within the right parietal lobe cortex suspicious for possible metastasis. Three-month contrast-enhanced MRI follow-up is recommended. Mild generalized parenchymal atrophy and chronic small vessel ischemic disease. Right maxillary sinus disease as described. Electronically Signed   By: KyKellie Simmering On: 09/21/2018 10:00   Nm Pet Image Initial (pi) Skull Base To Thigh  Result Date: 09/18/2018 CLINICAL DATA:  Initial treatment strategy for lung nodule. Staging. Left-sided thoracentesis demonstrating adenocarcinoma EXAM: NUCLEAR MEDICINE PET SKULL BASE TO THIGH TECHNIQUE: 11.5 mCi F-18 FDG was injected intravenously. Full-ring PET imaging was performed from the skull base to  thigh after the radiotracer. CT data  was obtained and used for attenuation correction and anatomic localization. Fasting blood glucose: 129 mg/dl COMPARISON:  08/24/2018 chest abdomen and pelvic CTs. FINDINGS: Mediastinal blood pool activity: SUV max 2.3 Liver activity: SUV max NA NECK: No areas of abnormal hypermetabolism. Incidental CT findings: Mucosal thickening right maxillary sinus. Bilateral carotid atherosclerosis. No cervical adenopathy. CHEST: Low-level hypermetabolism corresponding to areas of patchy consolidation within the central left upper and less so medial left lower lobe. Example within the central left upper lobe relatively diffusely at a S.U.V. max of 5.2 including on image 105/3. The most focal area of hypermetabolism and nodularity is along the pleural surface adjacent the medial right upper lobe. This measures on the order of a S.U.V. max of 5.5, including on image 90/3. Multifocal parietal pleural hypermetabolism, including corresponding to nodularity at 1.1 cm and a S.U.V. max of 4.1 on 87/3. Incidental CT findings: Left-sided hydropneumothorax. The pleural air component is small to moderate. The amount of left-sided pleural fluid is relatively similar to the CT of 08/23/2018. Emphysema. Aortic and coronary artery atherosclerosis with prior median sternotomy. ABDOMEN/PELVIS: No abdominopelvic parenchymal or nodal hypermetabolism. Incidental CT findings: Normal adrenal glands. Left renal vascular calcification. Dependent gallstone or stones. Abdominal aortic atherosclerosis. SKELETON: No abnormal marrow activity. Incidental CT findings: none IMPRESSION: 1. Left-sided pleural hypermetabolism, consistent with malignant pleural effusion. 2. Left upper and less so left lower lobe low-level hypermetabolism corresponding to areas of airspace and ground-glass opacity. This is primarily felt to represent atelectasis. Underlying left upper lobe primary bronchogenic carcinoma cannot be excluded. This  is suboptimally evaluated secondary to the extent of volume loss from hydropneumothorax. 3. A medial left upper lobe area of more focal hypermetabolism could also represent pleural disease or a site of a small left upper lobe primary. 4. No evidence of extrathoracic hypermetabolic metastasis. 5. Left-sided hydropneumothorax with the pleural air component being new since the prior CT. 6. Cholelithiasis. These results will be called to the ordering clinician or representative by the Radiologist Assistant, and communication documented in the PACS or zVision Dashboard. Electronically Signed   By: Abigail Miyamoto M.D.   On: 09/18/2018 15:26   Dg Chest Port 1 View  Result Date: 10/02/2018 CLINICAL DATA:  Postop check EXAM: PORTABLE CHEST 1 VIEW COMPARISON:  Yesterday FINDINGS: Tracheal extubation. A left chest tube remains. Moderate left pneumothorax seen at the base more than apex. Small volume pleural fluid at the left base, diminished. The right lung remains clear. Normal heart size. CABG. IMPRESSION: Moderate left pneumothorax after pleural fluid evacuation. Electronically Signed   By: Monte Fantasia M.D.   On: 10/02/2018 09:29   Dg Chest Port 1 View  Result Date: 10/01/2018 CLINICAL DATA:  Postop left-sided catheter placement for malignant pleural effusion. EXAM: PORTABLE CHEST 1 VIEW COMPARISON:  09/28/2018 FINDINGS: There is a new left-sided tunneled chest tube. One of the sideholes may be straddling the chest wall. There is a large left-sided pleural effusion. The heart size is likely stable but evaluation is limited by the presence of a large pleural effusion. The patient is status post prior median sternotomy. There is no clear pneumothorax. No acute osseous abnormality. IMPRESSION: 1. Interval placement of a tunneled left-sided PleurX catheter. The last side hole may be straddling the chest wall. This can be further evaluated with oblique imaging. 2. Large left-sided pleural effusion.  No pneumothorax.  Electronically Signed   By: Constance Holster M.D.   On: 10/01/2018 09:09   Dg Chest Port 1 View  Result Date:  09/13/2018 CLINICAL DATA:  History of lung cancer now with recurrent symptomatic left-sided pleural effusion, post thoracentesis. EXAM: PORTABLE CHEST 1 VIEW COMPARISON:  09/07/2018 09/05/2018; chest CT-08/23/2018; ultrasound-guided thoracentesis - 09/05/2018 yielding 3 L pleural fluid FINDINGS: Grossly unchanged cardiac silhouette and mediastinal contours post median sternotomy and CABG. Stable postsurgical change of the left hilum. Interval reduction/near resolution of left-sided pleural effusion post large volume left-sided thoracentesis. Improved aeration of left lung base with persistent left perihilar and basilar heterogeneous/consolidative opacities. Mild pulmonary is congestion without frank evidence of edema. The right lung remains well aerated. No pneumothorax. No acute osseous abnormalities. IMPRESSION: 1. Interval reduction/near resolution left-sided effusion post thoracentesis. No pneumothorax. 2. Improved aeration of the left lung with persistent left perihilar and basilar opacities, likely atelectasis. Electronically Signed   By: Sandi Mariscal M.D.   On: 09/13/2018 09:05   Dg Chest Port 1 View  Result Date: 09/05/2018 CLINICAL DATA:  Status post left thoracentesis EXAM: PORTABLE CHEST 1 VIEW COMPARISON:  08/30/2018 FINDINGS: Cardiac shadow is stable. Postsurgical changes are again noted. Persistent scarring is noted in the left lung base with minimal residual effusion following thoracentesis. No pneumothorax is noted. No bony abnormality is seen. IMPRESSION: No pneumothorax following left-sided thoracentesis. Electronically Signed   By: Inez Catalina M.D.   On: 09/05/2018 12:20   US Thoracentesis Asp Pleural Space W/img Guide  Result Date: 09/13/2018 INDICATION: History of lung cancer, now with recurrent left-sided pleural effusion. Patient presents today for ultrasound-guided  left-sided thoracentesis for therapeutic purposes. EXAM: US THORACENTESIS ASP PLEURAL SPACE W/IMG GUIDE COMPARISON:  Previous large volume left-sided thoracentesis performed 09/05/2018 (yielding 3 L) and 08/24/2018 (yielding 1.8 L). MEDICATIONS: None. COMPLICATIONS: None immediate. TECHNIQUE: Informed written consent was obtained from the patient after a discussion of the risks, benefits and alternatives to treatment. A timeout was performed prior to the initiation of the procedure. Initial ultrasound scanning demonstrates a large recurrent anechoic left-sided pleural effusion. The lower chest was prepped and draped in the usual sterile fashion. 1% lidocaine was used for local anesthesia. An ultrasound image was saved for documentation purposes. An 8 Fr Safe-T-Centesis catheter was introduced. The thoracentesis was performed. The catheter was removed and a dressing was applied. The patient tolerated the procedure well without immediate post procedural complication. The patient was escorted to have an upright chest radiograph. FINDINGS: A total of approximately 3 liters of slightly blood-tinged fluid was removed. IMPRESSION: Successful ultrasound-guided left sided thoracentesis yielding 3 liters of pleural fluid. Electronically Signed   By: Sandi Mariscal M.D.   On: 09/13/2018 09:08   US Thoracentesis Asp Pleural Space W/img Guide  Result Date: 09/05/2018 INDICATION: Left-sided recurrent pleural effusion EXAM: ULTRASOUND GUIDED THORACENTESIS MEDICATIONS: None. COMPLICATIONS: None immediate. PROCEDURE: An ultrasound guided thoracentesis was thoroughly discussed with the patient and questions answered. The benefits, risks, alternatives and complications were also discussed. The patient understands and wishes to proceed with the procedure. Written consent was obtained. Ultrasound was performed to localize and mark an adequate pocket of fluid in the left chest. The area was then prepped and draped in the normal sterile  fashion. 1% Lidocaine was used for local anesthesia. Under ultrasound guidance, a 6 Fr Safe-T-Centesis catheter was introduced. Thoracentesis was performed. The catheter was removed and a dressing applied. FINDINGS: A total of approximately 3 L of sanguinous fluid was removed. Samples were sent to the laboratory as requested by the clinical team. IMPRESSION: Successful ultrasound guided left thoracentesis yielding 3 L of pleural fluid. Electronically Signed  By: Inez Catalina M.D.   On: 09/05/2018 12:21     ASSESSMENT & PLAN:  1. lung adenocarcinoma   2. 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 Patient started on dabrafenib 150 mg twice daily and trametinib 2 mg daily.  Clinically doing well.  Continue.  #Weight loss, poor oral intake.  Continue Marinol 5 mg twice daily.  Follow-up with palliative care.  We spent sufficient time to discuss many aspect of care, questions were answered to patient's satisfaction. Total face to face encounter time for this patient visit was 15 min. >50% of the time was  spent in counseling and coordination of care.   Return of visit:  1 week.  Earlie Server, MD, PhD Hematology Oncology [facility] [place]- [phone] 11/09/2018 Sputum.

## 2018-11-22 NOTE — Progress Notes (Signed)
Patient here for follow up. States that he feels fatigued for about 4-5 hours after he takes terfilnar.

## 2018-11-23 ENCOUNTER — Other Ambulatory Visit: Payer: Self-pay

## 2018-11-23 ENCOUNTER — Ambulatory Visit: Payer: Medicare Other

## 2018-11-23 VITALS — BP 130/82 | HR 120 | Temp 97.5°F | Resp 16 | Ht 72.0 in | Wt 196.0 lb

## 2018-11-23 DIAGNOSIS — J90 Pleural effusion, not elsewhere classified: Secondary | ICD-10-CM

## 2018-11-27 ENCOUNTER — Encounter: Payer: Self-pay | Admitting: Pharmacy Technician

## 2018-11-27 NOTE — Progress Notes (Signed)
Patient no longer getting Keytruda from [employer] based on regimen change. Last DOS covered is 11/09/18.

## 2018-12-03 ENCOUNTER — Other Ambulatory Visit: Payer: Medicare Other

## 2018-12-03 ENCOUNTER — Ambulatory Visit: Payer: Medicare Other

## 2018-12-03 ENCOUNTER — Ambulatory Visit: Payer: Medicare Other | Admitting: Oncology

## 2018-12-06 ENCOUNTER — Encounter: Payer: Self-pay | Admitting: Oncology

## 2018-12-06 ENCOUNTER — Other Ambulatory Visit: Payer: Self-pay

## 2018-12-06 NOTE — Progress Notes (Signed)
Patient prescreened for appointment. Denies any shortness of breath. No concerns voiced.

## 2018-12-07 ENCOUNTER — Inpatient Hospital Stay: Payer: Medicare Other

## 2018-12-07 ENCOUNTER — Inpatient Hospital Stay: Payer: Medicare Other | Attending: Oncology | Admitting: Hospice and Palliative Medicine

## 2018-12-07 ENCOUNTER — Inpatient Hospital Stay (HOSPITAL_BASED_OUTPATIENT_CLINIC_OR_DEPARTMENT_OTHER): Payer: Medicare Other | Admitting: Oncology

## 2018-12-07 ENCOUNTER — Other Ambulatory Visit: Payer: Self-pay

## 2018-12-07 VITALS — BP 136/67 | HR 111 | Temp 97.8°F | Resp 16 | Wt 190.8 lb

## 2018-12-07 DIAGNOSIS — R634 Abnormal weight loss: Secondary | ICD-10-CM | POA: Insufficient documentation

## 2018-12-07 DIAGNOSIS — E1165 Type 2 diabetes mellitus with hyperglycemia: Secondary | ICD-10-CM | POA: Diagnosis not present

## 2018-12-07 DIAGNOSIS — E785 Hyperlipidemia, unspecified: Secondary | ICD-10-CM | POA: Insufficient documentation

## 2018-12-07 DIAGNOSIS — E1136 Type 2 diabetes mellitus with diabetic cataract: Secondary | ICD-10-CM | POA: Insufficient documentation

## 2018-12-07 DIAGNOSIS — F17210 Nicotine dependence, cigarettes, uncomplicated: Secondary | ICD-10-CM | POA: Insufficient documentation

## 2018-12-07 DIAGNOSIS — R350 Frequency of micturition: Secondary | ICD-10-CM | POA: Diagnosis not present

## 2018-12-07 DIAGNOSIS — Z5111 Encounter for antineoplastic chemotherapy: Secondary | ICD-10-CM | POA: Diagnosis not present

## 2018-12-07 DIAGNOSIS — R5383 Other fatigue: Secondary | ICD-10-CM | POA: Insufficient documentation

## 2018-12-07 DIAGNOSIS — J439 Emphysema, unspecified: Secondary | ICD-10-CM | POA: Diagnosis not present

## 2018-12-07 DIAGNOSIS — R0602 Shortness of breath: Secondary | ICD-10-CM | POA: Insufficient documentation

## 2018-12-07 DIAGNOSIS — I10 Essential (primary) hypertension: Secondary | ICD-10-CM | POA: Insufficient documentation

## 2018-12-07 DIAGNOSIS — R000 Tachycardia, unspecified: Secondary | ICD-10-CM | POA: Diagnosis not present

## 2018-12-07 DIAGNOSIS — Z794 Long term (current) use of insulin: Secondary | ICD-10-CM | POA: Insufficient documentation

## 2018-12-07 DIAGNOSIS — Z79899 Other long term (current) drug therapy: Secondary | ICD-10-CM | POA: Diagnosis not present

## 2018-12-07 DIAGNOSIS — C3490 Malignant neoplasm of unspecified part of unspecified bronchus or lung: Secondary | ICD-10-CM

## 2018-12-07 DIAGNOSIS — R05 Cough: Secondary | ICD-10-CM | POA: Insufficient documentation

## 2018-12-07 DIAGNOSIS — C3410 Malignant neoplasm of upper lobe, unspecified bronchus or lung: Secondary | ICD-10-CM | POA: Diagnosis not present

## 2018-12-07 DIAGNOSIS — Z8249 Family history of ischemic heart disease and other diseases of the circulatory system: Secondary | ICD-10-CM | POA: Insufficient documentation

## 2018-12-07 DIAGNOSIS — Z515 Encounter for palliative care: Secondary | ICD-10-CM

## 2018-12-07 DIAGNOSIS — Z951 Presence of aortocoronary bypass graft: Secondary | ICD-10-CM | POA: Insufficient documentation

## 2018-12-07 LAB — CBC WITH DIFFERENTIAL/PLATELET
Abs Immature Granulocytes: 0.03 10*3/uL (ref 0.00–0.07)
Basophils Absolute: 0.1 10*3/uL (ref 0.0–0.1)
Basophils Relative: 2 %
Eosinophils Absolute: 1 10*3/uL — ABNORMAL HIGH (ref 0.0–0.5)
Eosinophils Relative: 16 %
HCT: 33.3 % — ABNORMAL LOW (ref 39.0–52.0)
Hemoglobin: 10.7 g/dL — ABNORMAL LOW (ref 13.0–17.0)
Immature Granulocytes: 1 %
Lymphocytes Relative: 20 %
Lymphs Abs: 1.3 10*3/uL (ref 0.7–4.0)
MCH: 27.9 pg (ref 26.0–34.0)
MCHC: 32.1 g/dL (ref 30.0–36.0)
MCV: 86.7 fL (ref 80.0–100.0)
Monocytes Absolute: 0.7 10*3/uL (ref 0.1–1.0)
Monocytes Relative: 11 %
Neutro Abs: 3.3 10*3/uL (ref 1.7–7.7)
Neutrophils Relative %: 50 %
Platelets: 320 10*3/uL (ref 150–400)
RBC: 3.84 MIL/uL — ABNORMAL LOW (ref 4.22–5.81)
RDW: 13.4 % (ref 11.5–15.5)
WBC: 6.4 10*3/uL (ref 4.0–10.5)
nRBC: 0 % (ref 0.0–0.2)

## 2018-12-07 LAB — COMPREHENSIVE METABOLIC PANEL
ALT: 10 U/L (ref 0–44)
AST: 12 U/L — ABNORMAL LOW (ref 15–41)
Albumin: 3.2 g/dL — ABNORMAL LOW (ref 3.5–5.0)
Alkaline Phosphatase: 118 U/L (ref 38–126)
Anion gap: 9 (ref 5–15)
BUN: 17 mg/dL (ref 8–23)
CO2: 26 mmol/L (ref 22–32)
Calcium: 8.9 mg/dL (ref 8.9–10.3)
Chloride: 98 mmol/L (ref 98–111)
Creatinine, Ser: 0.64 mg/dL (ref 0.61–1.24)
GFR calc Af Amer: >60 mL/min (ref >60)
GFR calc non Af Amer: >60 mL/min (ref >60)
Glucose, Bld: 326 mg/dL — ABNORMAL HIGH (ref 70–99)
Potassium: 3.9 mmol/L (ref 3.5–5.1)
Sodium: 133 mmol/L — ABNORMAL LOW (ref 135–145)
Total Bilirubin: 0.4 mg/dL (ref 0.3–1.2)
Total Protein: 7.1 g/dL (ref 6.5–8.1)

## 2018-12-07 NOTE — Progress Notes (Signed)
Attempted to call patient several times around time of our scheduled telephone visit.  Was unable to reach patient.  Will reschedule.

## 2018-12-08 DIAGNOSIS — Z5111 Encounter for antineoplastic chemotherapy: Secondary | ICD-10-CM | POA: Insufficient documentation

## 2018-12-08 NOTE — Progress Notes (Signed)
Hematology/Oncologyfollow up  note [facility] [phone] [phone]   Patient Care Team: Tonia Ghent, MD as PCP - General (Family Medicine) Thelma Comp, [place] (Optometry) Telford Nab, RN as Registered Nurse  REFERRING PROVIDER: Tonia Ghent, MD  CHIEF COMPLAINTS/REASON FOR VISIT:  Follow-up for metastatic lung adenocarcinoma  HISTORY OF PRESENTING ILLNESS:   Craig Koch is a  63 y.o.  male with PMH listed below was seen in consultation at the request of  Tonia Ghent, MD  for evaluation of malignant pleural effusion. Patient was recently admitted to [facility] due to large amount of pleural effusion, acute hypoxic respiratory failure. Patient reports feeling shortness of breath and dyspnea on exertion for several weeks and presented to emergency room.  A chest x-ray 08/23/2018 showed a large amount of left pleural effusion 08/24/2018 CT chest abdomen pelvis confirmed large left pleural effusion with near complete collapse of the left lower lobe and extensive volume loss in the lingula and a portion of the left upper lobe. No acute intra-abdominal process.  Cholelithiasis, diverticulitis, coronary atherosclerosis post CABG.  Bilateral renal cortical scarring.  Aortic atherosclerosis.  #08/24/2018 ultrasound guided diagnostic and therapeutic thoracentesis removed 1.8 L of pleural fluid. Patient subsequently felt better and was discharged to follow-up with pulmonology. 09/04/2018 patient followed up with Dr.Aleskerov chest x-ray showed recurrent moderate left pleural effusion.  Patient underwent ultrasound-guided left thoracentesis again and it drained 3 L of fluid. Pleural fluid cytology positive for adenocarcinoma. Patient was referred to me for further evaluation and management.  # PET eft sided pleural hypermetabolic activity  consistent with malignant pleural effusion. Left upper and lower lobe low-level hypermetabolic him  corresponding to areas of airspace and groundglass opacity.  This was felt to be secondary to atelectasis.  Underlying left upper lobe primary bronchogenic carcinoma cannot be excluded. Medial left upper lobe area more focal hypermetabolic area could represent a pleural disease or a site of small left upper lobe primary. No evidence of extra thoracic hypermetabolic metastatic disease.  Left sided hydro-pneumothorax within the pleural air component being new since the prior CT. Cholelithiasis.  #MRI brain showed punctate focus of enhancement within the right parietal lobe cortex suspicious for possible metastasis.  Molecular study showed BRAF V600 mutation.  INTERVAL HISTORY Craig Koch is a 63 y.o. 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  Problems and complaints are listed below: Patient is accompanied by wife today.  Patient has been started on dabrafenib and trametinib since since 11/16/2018. He tolerates the medication well.  Noticed increased fatigue/dizziness.  Otherwise no significant side effects he has experienced.  He denies any fever, chills, nausea, vomiting, more shortness of breath than his baseline. He has reported that his shortness of breath has improved. He walks into the clinic today. Appetite is slightly better.  Wife has noticed that patient's cough has improved.   Review of Systems  Constitutional: Positive for fatigue. Negative for appetite change, chills, fever and unexpected weight change.  HENT:   Negative for hearing loss and voice change.   Eyes: Negative for eye problems and icterus.  Respiratory: Positive for shortness of breath. Negative for chest tightness and cough.   Cardiovascular: Negative for chest pain and leg swelling.  Gastrointestinal: Negative for abdominal distention and abdominal pain.  Endocrine: Negative for hot flashes.  Genitourinary:  Negative for difficulty urinating, dysuria and frequency.  Musculoskeletal: Negative for arthralgias.  Skin: Negative for itching and rash.  Neurological: Negative for light-headedness and numbness.  Hematological: Negative for adenopathy. Does not bruise/bleed easily.  Psychiatric/Behavioral: Negative for confusion.    MEDICAL HISTORY:  Past Medical History:  Diagnosis Date   Anxiety    Arthritis    Cancer ([place])    lung   Coronary artery disease    Diabetes mellitus    Dyslipidemia    Hx of CABG    Hyperlipidemia    Hypertension    Pleural effusion     SURGICAL HISTORY: Past Surgical History:  Procedure Laterality Date   CATARACT EXTRACTION Left 09/2015   CHEST TUBE INSERTION Left 10/01/2018   Procedure: INSERTION PLEURAL DRAINAGE CATHETER;  Surgeon: [name], MD;  Location: [facility] ORS;  Service: Thoracic;  Laterality: Left;   CORONARY ARTERY BYPASS GRAFT  2004   (CABG with LIMA to the  LAD, SVG to OM2/OM3, SVG  to diag   Left ankle surgery     repair of fracture   Right lower leg surgery     rod   SOCIAL HISTORY: Social History   Socioeconomic History   Marital status: Married    Spouse name: Not on file   Number of children: Not on file   Years of education: Not on file   Highest education level: Not on file  Occupational History   Not on file  Social Needs   Financial resource strain: Not on file   Food insecurity    Worry: Not on file    Inability: Not on file   Transportation needs    Medical: Not on file    Non-medical: Not on file  Tobacco Use   Smoking status: Current Every Day Smoker    Packs/day: 0.20    Years: 45.00    Pack years: 9.00    Types: Cigarettes   Smokeless tobacco: Former Systems developer    Types: Snuff  Substance and Sexual Activity   Alcohol use: Yes    Alcohol/week: 6.0 standard drinks    Types: 6 Cans of beer per week    Comment: occ, average 6 pack in a week   Drug use: No   Sexual activity: Never    Lifestyle   Physical activity    Days per week: Not on file    Minutes per session: Not on file   Stress: Not on file  Relationships   Social connections    Talks on phone: Not on file    Gets together: Not on file    Attends religious service: Not on file    Active member of club or organization: Not on file    Attends meetings of clubs or organizations: Not on file    Relationship status: Not on file   Intimate partner violence    Fear of current or ex partner: Not on file    Emotionally abused: Not on file    Physically abused: Not on file    Forced sexual activity: Not on file  Other Topics Concern   Not on file  Social History Narrative   On disability 2009 after prev injuries and CAD.     Married 1976   2 kids, 4 grandkids.      FAMILY HISTORY: Family History  Problem Relation Age of Onset   Dementia Mother    Heart disease Father    Colon cancer Neg Hx    Prostate cancer Neg Hx    Diabetes Neg Hx  ALLERGIES:  is allergic to lipitor [atorvastatin calcium].  MEDICATIONS: PHYSICAL EXAMINATION: Current Outpatient Medications on File Prior to Visit  Medication Sig Dispense Refill   albuterol (VENTOLIN HFA) 108 (90 Base) MCG/ACT inhaler Inhale 2 puffs into the lungs every 6 (six) hours as needed for wheezing or shortness of breath. 18 g 0   BD INSULIN SYRINGE U/F 31G X 5/16" 0.5 ML MISC USE DAILY AS DIRECTED. DX E11.9 100 each 1   dabrafenib mesylate (TAFINLAR) 75 MG capsule Take 2 capsules (150 mg total) by mouth 2 (two) times daily. Take on an empty stomach 1 hour before or 2 hours after meals. 120 capsule 3   dronabinol (MARINOL) 5 MG capsule Take 1 capsule (5 mg total) by mouth 2 (two) times daily before a meal. 60 capsule 0   DULoxetine (CYMBALTA) 30 MG capsule Take 1 capsule (30 mg total) by mouth daily.     glucose blood (ONE TOUCH ULTRA TEST) test strip USE TO TEST GLUCOSE LEVELS 4 (FOUR) TIMES DAILY. 400 each 3   insulin detemir (LEVEMIR)  100 UNIT/ML injection Inject 0.3 mLs (30 Units total) into the skin at bedtime. 10 mL 5   insulin lispro (HUMALOG) 100 UNIT/ML injection Inject 0.15 mLs (15 Units total) into the skin 3 (three) times daily with meals.     lisinopril (ZESTRIL) 2.5 MG tablet TAKE 1 TABLET BY MOUTH DAILY 90 tablet 2   metFORMIN (GLUCOPHAGE) 500 MG tablet TAKE 1 TABLET (500 MG TOTAL) BY MOUTH 2 (TWO) TIMES DAILY WITH A MEAL. 180 tablet 1   metoprolol tartrate (LOPRESSOR) 100 MG tablet TAKE 0.5 TABLETS (50 MG TOTAL) BY MOUTH 2 (TWO) TIMES DAILY. 90 tablet 1   ondansetron (ZOFRAN) 8 MG tablet Take 1 tablet (8 mg total) by mouth every 8 (eight) hours as needed for nausea or vomiting. 45 tablet 0   rosuvastatin (CRESTOR) 10 MG tablet TAKE 1 TABLET (10 MG TOTAL) BY MOUTH DAILY. 90 tablet 3   trametinib dimethyl sulfoxide (MEKINIST) 2 MG tablet Take 1 tablet (2 mg total) by mouth daily. Take 1 hour before or 2 hours after a meal. Store refrigerated in original container. 30 tablet 3   No current facility-administered medications on file prior to visit.     ECOG PERFORMANCE STATUS:2  Today's Vitals   12/07/18 0953  BP: 136/67  Pulse: (!) 111  Resp: 16  Temp: 97.8 F (36.6 C)  TempSrc: Tympanic  SpO2: 98%  Weight: 190 lb 12.8 oz (86.5 kg)   Body mass index is 25.88 kg/m.  Physical Exam Constitutional:      General: He is not in acute distress.    Appearance: He is ill-appearing.     Comments: Cachectic  HENT:     Head: Normocephalic and atraumatic.  Eyes:     General: No scleral icterus.    Pupils: Pupils are equal, round, and reactive to light.  Neck:     Musculoskeletal: Normal range of motion and neck supple.  Cardiovascular:     Rate and Rhythm: Regular rhythm. Tachycardia present.     Heart sounds: Normal heart sounds.  Pulmonary:     Effort: Pulmonary effort is normal. No respiratory distress.     Breath sounds: No wheezing.     Comments: Decreased breathing sounds left lower lobe.     Abdominal:     General: Bowel sounds are normal. There is no distension.     Palpations: Abdomen is soft. There is no mass.  Tenderness: There is no abdominal tenderness.  Musculoskeletal: Normal range of motion.        General: No deformity.  Skin:    General: Skin is warm and dry.     Findings: No erythema or rash.  Neurological:     Mental Status: He is alert and oriented to person, place, and time.     Cranial Nerves: No cranial nerve deficit.     Coordination: Coordination normal.  Psychiatric:        Behavior: Behavior normal.        Thought Content: Thought content normal.     LABORATORY DATA:  I have reviewed the data as listed Lab Results  Component Value Date   WBC 18.9 (H) 11/09/2018   HGB 11.0 (L) 11/09/2018   HCT 33.5 (L) 11/09/2018   MCV 89.8 11/09/2018   PLT 581 (H) 11/09/2018   Recent Labs    10/19/18 0841 11/02/18 1238 11/09/18 0833  NA 137 132* 131*  K 4.0 4.0 3.5  CL 104 95* 94*  CO2 '24 22 26  ' GLUCOSE 252* 425* 361*  BUN '14 15 12  ' CREATININE 0.61 0.84 0.77  CALCIUM 8.5* 8.5* 8.0*  GFRNONAA >60 >60 >60  GFRAA >60 >60 >60  PROT 6.0* 7.0 6.5  ALBUMIN 3.0* 2.6* 2.3*  AST 14* 11* 12*  ALT '10 10 9  ' ALKPHOS 69 75 63  BILITOT 0.5 0.9 0.8   Iron/TIBC/Ferritin/ %Sat No results found for: IRON, TIBC, FERRITIN, IRONPCTSAT    RADIOGRAPHIC STUDIES: I have personally reviewed the radiological images as listed and agreed with the findings in the report.   Dg Chest 2 View  Result Date: 11/16/2018 CLINICAL DATA:  Follow-up pleural effusion.  11/02/2018 EXAM: CHEST - 2 VIEW COMPARISON:  11/02/2018. FINDINGS: Previous median sternotomy and CABG procedure. Normal heart size. There is asymmetric left lung volume loss. No change in left pleural effusion with left chest tube in place. No pneumothorax identified. Interstitial and airspace opacities within the aerated portions of the left lung appear unchanged. Right lung is hyperinflated but clear.  IMPRESSION: 1. Stable position of left chest tube. No change in left pleural effusion and aeration in the left lung. Electronically Signed   By: Kerby Moors M.D.   On: 11/16/2018 08:34   Dg Chest 2 View  Result Date: 11/02/2018 CLINICAL DATA:  Pleural effusion with pleural catheter. EXAM: CHEST - 2 VIEW COMPARISON:  10/15/2018 FINDINGS: Evidence of patient's PleurX catheter over the left base. Stable to slightly worse moderate size left pleural effusion likely with associated basilar atelectasis. Patchy opacification over the left lung which may be due to atelectasis or infection. Right lung is clear. Cardiomediastinal silhouette and remainder of the exam is unchanged. IMPRESSION: 1. Stable to slightly worse moderate size left pleural effusion likely with associated basilar atelectasis. PleurX drainage catheter present over the left base. 2. Slight worsening hazy patchy opacification over the left lung which may be due to atelectasis or infection. Electronically Signed   By: Marin Olp M.D.   On: 11/02/2018 09:23   Dg Chest 2 View  Result Date: 10/15/2018 CLINICAL DATA:  Follow-up for a left pleural effusion. EXAM: CHEST - 2 VIEW COMPARISON:  Chest radiograph dated 10/04/2018. FINDINGS: The heart size and mediastinal contours are not significantly changed. A left-sided thoracostomy tube terminates near the medial left lung apex, unchanged. A small a moderate left pleural effusion with associated atelectasis/airspace disease is not significantly changed. The right lung is clear without pleural effusion. There  is no pneumothorax. Median sternotomy wires are redemonstrated. Soft tissue gas overlying the left chest and axilla has decreased. IMPRESSION: Unchanged left-sided thoracostomy tube with unchanged left pleural effusion and associated atelectasis/airspace disease. Electronically Signed   By: Zerita Boers M.D.   On: 10/15/2018 14:57   Dg Chest 2 View  Result Date: 10/04/2018 CLINICAL DATA:   Left lung cancer, follow-up chest tube EXAM: CHEST - 2 VIEW COMPARISON:  10/02/2018 FINDINGS: Left-sided hydropneumothorax with left-sided chest tube and slight interval increase in a small left pleural effusion component with associated extensive diffuse interstitial pulmonary opacity and partial atelectasis of the left lung. There remains a small, approximately 10% left pneumothorax, possibly with a trapped left lung. The right lung is normally aerated. Increased subcutaneous emphysema about the left chest wall and neck. Status post median sternotomy and CABG. IMPRESSION: 1. Left-sided hydropneumothorax with left-sided chest tube and slight interval increase in a small left pleural effusion component with associated extensive diffuse interstitial pulmonary opacity and partial atelectasis of the left lung. 2. There remains a small, approximately 10% left pneumothorax, possibly with a trapped left lung. 3. Increased subcutaneous emphysema about the left chest wall and neck. Electronically Signed   By: Eddie Candle M.D.   On: 10/04/2018 13:28   Chest 2 View  Result Date: 09/28/2018 CLINICAL DATA:  63 year old male under preoperative evaluation prior to scheduled surgery. Pleural effusion. Shortness of breath. EXAM: CHEST - 2 VIEW COMPARISON:  Chest x-ray 09/13/2018.  PET-CT 09/18/2018. FINDINGS: Large left-sided hydropneumothorax again noted, with small pneumothorax component in the apex of the left hemithorax and large pleural fluid component. No aerated left lung is identified, presumably secondary to underlying atelectasis. The right lung is clear. No right pleural effusion. No evidence of pulmonary edema. Cardiac silhouette is largely obscured. Status post median sternotomy for CABG. IMPRESSION: 1. Large left hydropneumothorax with probable complete atelectasis of the left lung. The hydropneumothorax is similar to recent PET-CT, although the degree of atelectasis in the left lung has increased. Electronically  Signed   By: Vinnie Langton M.D.   On: 09/28/2018 16:44   Ct Angio Chest Pe W Or Wo Contrast  Result Date: 11/02/2018 CLINICAL DATA:  History of Lung cancer.  Rule out pulmonary embolus. EXAM: CT ANGIOGRAPHY CHEST WITH CONTRAST TECHNIQUE: Multidetector CT imaging of the chest was performed using the standard protocol during bolus administration of intravenous contrast. Multiplanar CT image reconstructions and MIPs were obtained to evaluate the vascular anatomy. CONTRAST:  52m OMNIPAQUE IOHEXOL 350 MG/ML SOLN COMPARISON:  PET-CT from 09/18/2018 FINDINGS: Cardiovascular: Normal heart size. Mild pericardial thickening identified. Aortic atherosclerosis. Previous CABG. Main pulmonary artery appears patent. No saddle embolus. No central obstructing pulmonary artery filling defects noted. No lobar or segmental pulmonary artery filling defects identified. Mediastinum/Nodes: The trachea appears patent and is midline. Normal appearance of the esophagus. No mediastinal, axillary adenopathy. New left supraclavicular lymph node measures 0.9 cm, image 19/5. Lungs/Pleura: Moderate changes of centrilobular emphysema. Small right pleural effusion is new from previous exam. Interval placement of left-sided chest with decrease in volume of previous large loculated left pleural effusion. Unfortunately, there is been significant interval increase in pleural thickening with enhancement overlying the entire left lung concerning for pleural spread of tumor. Pleural based tumor along the paramediastinal left upper lobe has a thickness of 2.9 cm, image 145/6. With 0.8 cm previously. There is new diffuse interstitial thickening throughout the left lung concerning for lymphangitic spread of disease. Right lung appears hyperinflated but relatively clear. Upper  Abdomen: Stones identified within the gallbladder. No acute abnormality. Musculoskeletal: No chest wall abnormality. No acute or significant osseous findings. Review of the MIP  images confirms the above findings. IMPRESSION: 1. No evidence for acute pulmonary embolus. 2. Interval placement of left-sided chest tube with significant decrease in volume of loculated left pleural effusion. Unfortunately, there is been marked increase in tumor rind encasing the entire left lung concerning for progression of disease. There is also been new diffuse increased interstitial thickening throughout the left lung worrisome for lymphangitic spread of disease. Aortic Atherosclerosis (ICD10-I70.0) and Emphysema (ICD10-J43.9). Electronically Signed   By: Kerby Moors M.D.   On: 11/02/2018 14:16   Mr Jeri Cos VE Contrast  Result Date: 09/21/2018 CLINICAL DATA:  Malignant neoplasm of unspecified part of unspecified bronchus or lung. Lung cancer, non-small cell, staging EXAM: MRI HEAD WITHOUT AND WITH CONTRAST TECHNIQUE: Multiplanar, multiecho pulse sequences of the brain and surrounding structures were obtained without and with intravenous contrast. CONTRAST:  44m GADAVIST GADOBUTROL 1 MMOL/ML IV SOLN COMPARISON:  No pertinent prior studies available for comparison. FINDINGS: Brain: A punctate focus of enhancement within the right parietal lobe cortex is suspicious for possible metastasis (series 18, image 114). No other foci of abnormal intracranial enhancement are identified. There is no acute infarct. No midline shift or extra-axial fluid collection. No chronic intracranial hemorrhage. Mild scattered T2/FLAIR hyperintensity within the cerebral white matter and brainstem is nonspecific, but consistent with chronic small vessel ischemic disease. Mild generalized parenchymal atrophy. Vascular: Flow voids maintained within the proximal large arterial vessels. Skull and upper cervical spine: Normal marrow signal. Sinuses/Orbits: Imaged globes and orbits demonstrate no acute abnormality. Mild mucosal thickening within a small right maxillary sinus. No significant mastoid effusion. IMPRESSION: Punctate focus  of enhancement within the right parietal lobe cortex suspicious for possible metastasis. Three-month contrast-enhanced MRI follow-up is recommended. Mild generalized parenchymal atrophy and chronic small vessel ischemic disease. Right maxillary sinus disease as described. Electronically Signed   By: KKellie Simmering  On: 09/21/2018 10:00   Nm Pet Image Initial (pi) Skull Base To Thigh  Result Date: 09/18/2018 CLINICAL DATA:  Initial treatment strategy for lung nodule. Staging. Left-sided thoracentesis demonstrating adenocarcinoma EXAM: NUCLEAR MEDICINE PET SKULL BASE TO THIGH TECHNIQUE: 11.5 mCi F-18 FDG was injected intravenously. Full-ring PET imaging was performed from the skull base to thigh after the radiotracer. CT data was obtained and used for attenuation correction and anatomic localization. Fasting blood glucose: 129 mg/dl COMPARISON:  08/24/2018 chest abdomen and pelvic CTs. FINDINGS: Mediastinal blood pool activity: SUV max 2.3 Liver activity: SUV max NA NECK: No areas of abnormal hypermetabolism. Incidental CT findings: Mucosal thickening right maxillary sinus. Bilateral carotid atherosclerosis. No cervical adenopathy. CHEST: Low-level hypermetabolism corresponding to areas of patchy consolidation within the central left upper and less so medial left lower lobe. Example within the central left upper lobe relatively diffusely at a S.U.V. max of 5.2 including on image 105/3. The most focal area of hypermetabolism and nodularity is along the pleural surface adjacent the medial right upper lobe. This measures on the order of a S.U.V. max of 5.5, including on image 90/3. Multifocal parietal pleural hypermetabolism, including corresponding to nodularity at 1.1 cm and a S.U.V. max of 4.1 on 87/3. Incidental CT findings: Left-sided hydropneumothorax. The pleural air component is small to moderate. The amount of left-sided pleural fluid is relatively similar to the CT of 08/23/2018. Emphysema. Aortic and  coronary artery atherosclerosis with prior median sternotomy. ABDOMEN/PELVIS: No abdominopelvic parenchymal  or nodal hypermetabolism. Incidental CT findings: Normal adrenal glands. Left renal vascular calcification. Dependent gallstone or stones. Abdominal aortic atherosclerosis. SKELETON: No abnormal marrow activity. Incidental CT findings: none IMPRESSION: 1. Left-sided pleural hypermetabolism, consistent with malignant pleural effusion. 2. Left upper and less so left lower lobe low-level hypermetabolism corresponding to areas of airspace and ground-glass opacity. This is primarily felt to represent atelectasis. Underlying left upper lobe primary bronchogenic carcinoma cannot be excluded. This is suboptimally evaluated secondary to the extent of volume loss from hydropneumothorax. 3. A medial left upper lobe area of more focal hypermetabolism could also represent pleural disease or a site of a small left upper lobe primary. 4. No evidence of extrathoracic hypermetabolic metastasis. 5. Left-sided hydropneumothorax with the pleural air component being new since the prior CT. 6. Cholelithiasis. These results will be called to the ordering clinician or representative by the Radiologist Assistant, and communication documented in the PACS or zVision Dashboard. Electronically Signed   By: Abigail Miyamoto M.D.   On: 09/18/2018 15:26   Dg Chest Port 1 View  Result Date: 10/02/2018 CLINICAL DATA:  Postop check EXAM: PORTABLE CHEST 1 VIEW COMPARISON:  Yesterday FINDINGS: Tracheal extubation. A left chest tube remains. Moderate left pneumothorax seen at the base more than apex. Small volume pleural fluid at the left base, diminished. The right lung remains clear. Normal heart size. CABG. IMPRESSION: Moderate left pneumothorax after pleural fluid evacuation. Electronically Signed   By: Monte Fantasia M.D.   On: 10/02/2018 09:29   Dg Chest Port 1 View  Result Date: 10/01/2018 CLINICAL DATA:  Postop left-sided catheter  placement for malignant pleural effusion. EXAM: PORTABLE CHEST 1 VIEW COMPARISON:  09/28/2018 FINDINGS: There is a new left-sided tunneled chest tube. One of the sideholes may be straddling the chest wall. There is a large left-sided pleural effusion. The heart size is likely stable but evaluation is limited by the presence of a large pleural effusion. The patient is status post prior median sternotomy. There is no clear pneumothorax. No acute osseous abnormality. IMPRESSION: 1. Interval placement of a tunneled left-sided PleurX catheter. The last side hole may be straddling the chest wall. This can be further evaluated with oblique imaging. 2. Large left-sided pleural effusion.  No pneumothorax. Electronically Signed   By: Constance Holster M.D.   On: 10/01/2018 09:09   Dg Chest Port 1 View  Result Date: 09/13/2018 CLINICAL DATA:  History of lung cancer now with recurrent symptomatic left-sided pleural effusion, post thoracentesis. EXAM: PORTABLE CHEST 1 VIEW COMPARISON:  09/07/2018 09/05/2018; chest CT-08/23/2018; ultrasound-guided thoracentesis - 09/05/2018 yielding 3 L pleural fluid FINDINGS: Grossly unchanged cardiac silhouette and mediastinal contours post median sternotomy and CABG. Stable postsurgical change of the left hilum. Interval reduction/near resolution of left-sided pleural effusion post large volume left-sided thoracentesis. Improved aeration of left lung base with persistent left perihilar and basilar heterogeneous/consolidative opacities. Mild pulmonary is congestion without frank evidence of edema. The right lung remains well aerated. No pneumothorax. No acute osseous abnormalities. IMPRESSION: 1. Interval reduction/near resolution left-sided effusion post thoracentesis. No pneumothorax. 2. Improved aeration of the left lung with persistent left perihilar and basilar opacities, likely atelectasis. Electronically Signed   By: Sandi Mariscal M.D.   On: 09/13/2018 09:05   US Thoracentesis Asp  Pleural Space W/img Guide  Result Date: 09/13/2018 INDICATION: History of lung cancer, now with recurrent left-sided pleural effusion. Patient presents today for ultrasound-guided left-sided thoracentesis for therapeutic purposes. EXAM: US THORACENTESIS ASP PLEURAL SPACE W/IMG GUIDE COMPARISON:  Previous  large volume left-sided thoracentesis performed 09/05/2018 (yielding 3 L) and 08/24/2018 (yielding 1.8 L). MEDICATIONS: None. COMPLICATIONS: None immediate. TECHNIQUE: Informed written consent was obtained from the patient after a discussion of the risks, benefits and alternatives to treatment. A timeout was performed prior to the initiation of the procedure. Initial ultrasound scanning demonstrates a large recurrent anechoic left-sided pleural effusion. The lower chest was prepped and draped in the usual sterile fashion. 1% lidocaine was used for local anesthesia. An ultrasound image was saved for documentation purposes. An 8 Fr Safe-T-Centesis catheter was introduced. The thoracentesis was performed. The catheter was removed and a dressing was applied. The patient tolerated the procedure well without immediate post procedural complication. The patient was escorted to have an upright chest radiograph. FINDINGS: A total of approximately 3 liters of slightly blood-tinged fluid was removed. IMPRESSION: Successful ultrasound-guided left sided thoracentesis yielding 3 liters of pleural fluid. Electronically Signed   By: Sandi Mariscal M.D.   On: 09/13/2018 09:08     ASSESSMENT & PLAN:  1. lung adenocarcinoma   2. Encounter for antineoplastic chemotherapy   3. 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 Clinically patient is improving. Tolerating treatments Continue dabrafenib 150 mg twice daily and trametinib 2 mg daily.   Weight loss, continue to lose weight..  Continue Marinol 5 mg twice daily.  Follow-up  with palliative care. Continue nutritional supplementation with Ensure. Pt declines Dietitian referral We spent sufficient time to discuss many aspect of care, questions were answered to patient's satisfaction. Total face to face encounter time for this patient visit was 15 min. >50% of the time was  spent in counseling and coordination of care.   Return of visit:  1 week.  Earlie Server, MD, PhD Hematology Oncology [facility] [place]- [phone] 11/09/2018 S[place]um.

## 2018-12-10 ENCOUNTER — Telehealth: Payer: Self-pay | Admitting: Pharmacy Technician

## 2018-12-17 ENCOUNTER — Ambulatory Visit (INDEPENDENT_AMBULATORY_CARE_PROVIDER_SITE_OTHER): Payer: Medicare Other | Admitting: Family Medicine

## 2018-12-17 ENCOUNTER — Other Ambulatory Visit: Payer: Self-pay

## 2018-12-17 ENCOUNTER — Encounter: Payer: Self-pay | Admitting: Family Medicine

## 2018-12-17 VITALS — BP 102/50 | HR 106 | Temp 98.4°F | Ht 72.0 in | Wt 192.2 lb

## 2018-12-17 DIAGNOSIS — L03019 Cellulitis of unspecified finger: Secondary | ICD-10-CM | POA: Diagnosis not present

## 2018-12-17 DIAGNOSIS — I10 Essential (primary) hypertension: Secondary | ICD-10-CM

## 2018-12-17 DIAGNOSIS — E118 Type 2 diabetes mellitus with unspecified complications: Secondary | ICD-10-CM

## 2018-12-17 DIAGNOSIS — C3490 Malignant neoplasm of unspecified part of unspecified bronchus or lung: Secondary | ICD-10-CM

## 2018-12-17 MED ORDER — INSULIN LISPRO 100 UNIT/ML ~~LOC~~ SOLN: 20.0000 [IU] | Freq: Three times a day (TID) | SUBCUTANEOUS | Status: DC

## 2018-12-17 MED ORDER — METOPROLOL TARTRATE 100 MG PO TABS: 50.0000 mg | ORAL_TABLET | Freq: Every day | ORAL | Status: DC

## 2018-12-17 MED ORDER — INSULIN LISPRO 100 UNIT/ML ~~LOC~~ SOLN: 20.0000 [IU] | Freq: Three times a day (TID) | SUBCUTANEOUS | 5 refills | Status: DC

## 2018-12-17 MED ORDER — CEPHALEXIN 500 MG PO CAPS: 500.0000 mg | ORAL_CAPSULE | Freq: Three times a day (TID) | ORAL | 0 refills | Status: DC

## 2018-12-17 MED ORDER — INSULIN DETEMIR 100 UNIT/ML ~~LOC~~ SOLN: 40.0000 [IU] | Freq: Every day | SUBCUTANEOUS | 5 refills | Status: DC

## 2018-12-17 NOTE — Progress Notes (Signed)
This visit occurred during the SARS-CoV-2 public health emergency.  Safety protocols were in place, including screening questions prior to the visit, additional usage of staff PPE, and extensive cleaning of exam room while observing appropriate contact time as indicated for disinfecting solutions.  Diabetes:  Using medications without difficulties: yes Hypoglycemic episodes:no Hyperglycemic episodes: see below Blood Sugars averaging: 300 this AM, in spite of higher insulin dose.  Prev labs from oncology d/w pt.   We talked about cancer treatment and his appetite.  He is trying to eat as much as he can tolerate to keep his weight up.  His mood is lower, "I Layla't feel like doing nothing." pandemic considerations d/w pt.  He is fatigued.    Lower BP noted.  Has been lightheaded on standing, d/w pt about inc fluid and stopping lisinopril.  D/w pt about tapering metoprolol.   Finger lesions noted.  Left fourth and fifth fingers with paronychia/pus located underneath the proximal nail beds.  The lesion on the fifth finger had previously drained some pus when he tried to express it locally.  Meds, vitals, and allergies reviewed.   ROS: Per HPI unless specifically indicated in ROS section   GEN: nad, alert and oriented HEENT: ncat NECK: supple w/o LA CV: rrr. PULM: ctab, no inc wob ABD: soft, +bs EXT: no edema SKIN: no acute rash Left fourth and fifth nailbeds with paronychias.  Discussed options with patient.  We did not need to numb the fingers but I did clean them locally with alcohol and then using a separate 18-gauge needle on each nail I was able to drill out a small hole and expressed some pus locally.  This was tolerated well and was not painful for the patient.  There was no apparent injury to the tissue underneath the nail.  He does not have erythema spreading up either finger.

## 2018-12-17 NOTE — Patient Instructions (Addendum)
Stop lisinopril.  Cut metoprolol back to 1/2 tab a day.   Add 1 unit of levemir at night until your AM sugar is ~150.  Spence't change your mealtime insulin.  Let me know about your sugar in about 1 week.  Start keflex and keep your fingers covered/clean. Update me if not better.  They may continue to drain.  Take care.  Glad to see you.

## 2018-12-19 ENCOUNTER — Ambulatory Visit
Admission: RE | Admit: 2018-12-19 | Discharge: 2018-12-19 | Disposition: A | Discharge status: Home / Self Care | Payer: Medicare Other | Attending: Oncology | Admitting: Oncology

## 2018-12-19 ENCOUNTER — Other Ambulatory Visit: Payer: Self-pay

## 2018-12-19 ENCOUNTER — Ambulatory Visit
Admission: RE | Admit: 2018-12-19 | Discharge: 2018-12-19 | Disposition: A | Discharge status: Home / Self Care | Payer: Medicare Other | Source: Ambulatory Visit | Attending: Oncology | Admitting: Oncology

## 2018-12-19 DIAGNOSIS — C3490 Malignant neoplasm of unspecified part of unspecified bronchus or lung: Secondary | ICD-10-CM | POA: Diagnosis not present

## 2018-12-19 DIAGNOSIS — L03019 Cellulitis of unspecified finger: Secondary | ICD-10-CM | POA: Insufficient documentation

## 2018-12-19 NOTE — Assessment & Plan Note (Signed)
Now likely overtreated. Lower BP noted.  Has been lightheaded on standing, d/w pt about inc fluid and stopping lisinopril.  D/w pt about tapering metoprolol.  He can update me as needed.

## 2018-12-19 NOTE — Assessment & Plan Note (Signed)
Both treated as above without complication.  Start Keflex.  Lesions covered with Band-Aids after procedure as above.  He will update me as needed.  Routine cautions given to patient.

## 2018-12-19 NOTE — Assessment & Plan Note (Signed)
We talked about cancer treatment and his appetite.  He is trying to eat as much as he can tolerate to keep his weight up.  His mood is lower, "I Seferino't feel like doing nothing." pandemic considerations d/w pt.  He is fatigued.   No suicidal or homicidal intent and still okay for outpatient follow-up.  He has blood sugar elevation in spite of higher insulin dosing and weight loss.  Discussed options.  He will add 1 unit of levemir at night until his AM sugar is ~150.  No change in mealtime insulin for now. He will update me about his sugars in about 1 week, sooner if needed. >25 minutes spent in face to face time with patient, >50% spent in counselling or coordination of care

## 2018-12-19 NOTE — Assessment & Plan Note (Signed)
Per oncology °

## 2018-12-20 NOTE — Telephone Encounter (Signed)
Oral Oncology Patient Advocate Encounter  Received communication from [place] that the patient's eligibility in the patient assistance program was due for re-enrollment.  The renewal application has been completed and faxed in an effort to keep the patient's out of pocket expense for [employer] at $0.     Application completed and faxed to [phone] on 12/10/2018.   [employer] Patient [place] phone number for follow up is [phone].    This encounter will be updated until final determination.   [place] Patient [place] Phone [phone] Fax [phone]

## 2018-12-21 ENCOUNTER — Inpatient Hospital Stay (HOSPITAL_BASED_OUTPATIENT_CLINIC_OR_DEPARTMENT_OTHER): Payer: Medicare Other | Admitting: Oncology

## 2018-12-21 ENCOUNTER — Encounter: Payer: Self-pay | Admitting: Oncology

## 2018-12-21 ENCOUNTER — Other Ambulatory Visit: Payer: Self-pay

## 2018-12-21 ENCOUNTER — Inpatient Hospital Stay: Payer: Medicare Other

## 2018-12-21 VITALS — BP 142/61 | HR 118 | Temp 97.8°F | Resp 18 | Wt 187.2 lb

## 2018-12-21 DIAGNOSIS — C3490 Malignant neoplasm of unspecified part of unspecified bronchus or lung: Secondary | ICD-10-CM

## 2018-12-21 DIAGNOSIS — R000 Tachycardia, unspecified: Secondary | ICD-10-CM

## 2018-12-21 DIAGNOSIS — C3410 Malignant neoplasm of upper lobe, unspecified bronchus or lung: Secondary | ICD-10-CM | POA: Diagnosis not present

## 2018-12-21 DIAGNOSIS — R634 Abnormal weight loss: Secondary | ICD-10-CM

## 2018-12-21 DIAGNOSIS — E1165 Type 2 diabetes mellitus with hyperglycemia: Secondary | ICD-10-CM

## 2018-12-21 LAB — CBC WITH DIFFERENTIAL/PLATELET
Abs Immature Granulocytes: 0.01 10*3/uL (ref 0.00–0.07)
Basophils Absolute: 0.1 10*3/uL (ref 0.0–0.1)
Basophils Relative: 1 %
Eosinophils Absolute: 1.2 10*3/uL — ABNORMAL HIGH (ref 0.0–0.5)
Eosinophils Relative: 28 %
HCT: 37.8 % — ABNORMAL LOW (ref 39.0–52.0)
Hemoglobin: 12.1 g/dL — ABNORMAL LOW (ref 13.0–17.0)
Immature Granulocytes: 0 %
Lymphocytes Relative: 21 %
Lymphs Abs: 0.9 10*3/uL (ref 0.7–4.0)
MCH: 27.2 pg (ref 26.0–34.0)
MCHC: 32 g/dL (ref 30.0–36.0)
MCV: 84.9 fL (ref 80.0–100.0)
Monocytes Absolute: 0.4 10*3/uL (ref 0.1–1.0)
Monocytes Relative: 10 %
Neutro Abs: 1.7 10*3/uL (ref 1.7–7.7)
Neutrophils Relative %: 40 %
Platelets: 233 10*3/uL (ref 150–400)
RBC: 4.45 MIL/uL (ref 4.22–5.81)
RDW: 13.8 % (ref 11.5–15.5)
WBC: 4.3 10*3/uL (ref 4.0–10.5)
nRBC: 0 % (ref 0.0–0.2)

## 2018-12-21 LAB — COMPREHENSIVE METABOLIC PANEL
ALT: 11 U/L (ref 0–44)
AST: 20 U/L (ref 15–41)
Albumin: 3 g/dL — ABNORMAL LOW (ref 3.5–5.0)
Alkaline Phosphatase: 94 U/L (ref 38–126)
Anion gap: 12 (ref 5–15)
BUN: 13 mg/dL (ref 8–23)
CO2: 23 mmol/L (ref 22–32)
Calcium: 9 mg/dL (ref 8.9–10.3)
Chloride: 96 mmol/L — ABNORMAL LOW (ref 98–111)
Creatinine, Ser: 0.76 mg/dL (ref 0.61–1.24)
GFR calc Af Amer: >60 mL/min (ref >60)
GFR calc non Af Amer: >60 mL/min (ref >60)
Glucose, Bld: 408 mg/dL — ABNORMAL HIGH (ref 70–99)
Potassium: 4 mmol/L (ref 3.5–5.1)
Sodium: 131 mmol/L — ABNORMAL LOW (ref 135–145)
Total Bilirubin: 0.5 mg/dL (ref 0.3–1.2)
Total Protein: 7.1 g/dL (ref 6.5–8.1)

## 2018-12-21 NOTE — Progress Notes (Signed)
Hematology/Oncologyfollow up  note [facility] Telephone:([phone] Fax:(336) [phone]   Patient Care Team: Tonia Ghent, MD as PCP - General (Family Medicine) Thelma Comp, [place] (Optometry) Telford Nab, RN as Registered Nurse  REFERRING PROVIDER: Tonia Ghent, MD  CHIEF COMPLAINTS/REASON FOR VISIT:  Follow-up for metastatic lung adenocarcinoma  HISTORY OF PRESENTING ILLNESS:   Craig Koch is a  63 y.o.  male with PMH listed below was seen in consultation at the request of  Tonia Ghent, MD  for evaluation of malignant pleural effusion. Patient was recently admitted to [facility] due to large amount of pleural effusion, acute hypoxic respiratory failure. Patient reports feeling shortness of breath and dyspnea on exertion for several weeks and presented to emergency room.  A chest x-ray 08/23/2018 showed a large amount of left pleural effusion 08/24/2018 CT chest abdomen pelvis confirmed large left pleural effusion with near complete collapse of the left lower lobe and extensive volume loss in the lingula and a portion of the left upper lobe. No acute intra-abdominal process.  Cholelithiasis, diverticulitis, coronary atherosclerosis post CABG.  Bilateral renal cortical scarring.  Aortic atherosclerosis.  #08/24/2018 ultrasound guided diagnostic and therapeutic thoracentesis removed 1.8 L of pleural fluid. Patient subsequently felt better and was discharged to follow-up with pulmonology. 09/04/2018 patient followed up with Dr.Aleskerov chest x-ray showed recurrent moderate left pleural effusion.  Patient underwent ultrasound-guided left thoracentesis again and it drained 3 L of fluid. Pleural fluid cytology positive for adenocarcinoma. Patient was referred to me for further evaluation and management.  # PET eft sided pleural hypermetabolic activity  consistent with malignant pleural effusion. Left upper and lower lobe low-level hypermetabolic him  corresponding to areas of airspace and groundglass opacity.  This was felt to be secondary to atelectasis.  Underlying left upper lobe primary bronchogenic carcinoma cannot be excluded. Medial left upper lobe area more focal hypermetabolic area could represent a pleural disease or a site of small left upper lobe primary. No evidence of extra thoracic hypermetabolic metastatic disease.  Left sided hydro-pneumothorax within the pleural air component being new since the prior CT. Cholelithiasis.  #MRI brain showed punctate focus of enhancement within the right parietal lobe cortex suspicious for possible metastasis.  Molecular study showed BRAF V600 mutation.  INTERVAL HISTORY Craig Koch is a 63 y.o. 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  Problems and complaints are listed below: Patient is accompanied by wife today.  Patient has been started on dabrafenib and trametinib since since 11/16/2018. Patient reports feeling more tired.  He sleeps a lot. Weight loss 4 pounds.  He was accompanied by his wife in the clinic.  Wife reports that he is eating half of the portion that he used to. His diabetes is uncontrolled.  Today glucose level 400s.  He has seen primary care provider and has increased insulin dosage. Denies any fever, chills, nausea, vomiting, more shortness of breath than his baseline.  Cough has improved. Frequent urination.  No dysuria   Review of Systems  Constitutional: Positive for fatigue and unexpected weight change. Negative for appetite change, chills and fever.  HENT:   Negative for hearing loss and voice change.   Eyes: Negative for eye problems and icterus.  Respiratory: Positive for shortness of breath. Negative for chest tightness and cough.   Cardiovascular: Negative for chest pain and leg swelling.  Gastrointestinal: Negative for abdominal distention  and abdominal  pain.  Endocrine: Negative for hot flashes.  Genitourinary: Positive for frequency. Negative for difficulty urinating and dysuria.   Musculoskeletal: Negative for arthralgias.  Skin: Negative for itching and rash.  Neurological: Negative for light-headedness and numbness.  Hematological: Negative for adenopathy. Does not bruise/bleed easily.  Psychiatric/Behavioral: Negative for confusion.    MEDICAL HISTORY:  Past Medical History:  Diagnosis Date  . Anxiety   . Arthritis   . Cancer ([place])    lung  . Coronary artery disease   . Diabetes mellitus   . Dyslipidemia   . Hx of CABG   . Hyperlipidemia   . Hypertension   . Pleural effusion     SURGICAL HISTORY: Past Surgical History:  Procedure Laterality Date  . CATARACT EXTRACTION Left 09/2015  . CHEST TUBE INSERTION Left 10/01/2018   Procedure: INSERTION PLEURAL DRAINAGE CATHETER;  Surgeon: [name], MD;  Location: [facility] ORS;  Service: Thoracic;  Laterality: Left;  . CORONARY ARTERY BYPASS GRAFT  2004   (CABG with LIMA to the  LAD, SVG to OM2/OM3, SVG  to diag  . Left ankle surgery     repair of fracture  . Right lower leg surgery     rod   SOCIAL HISTORY: Social History   Socioeconomic History  . Marital status: Married    Spouse name: Not on file  . Number of children: Not on file  . Years of education: Not on file  . Highest education level: Not on file  Occupational History  . Not on file  Tobacco Use  . Smoking status: Current Every Day Smoker    Packs/day: 0.20    Years: 45.00    Pack years: 9.00    Types: Cigarettes  . Smokeless tobacco: Former Systems developer    Types: Snuff  Substance and Sexual Activity  . Alcohol use: Yes    Alcohol/week: 6.0 standard drinks    Types: 6 Cans of beer per week    Comment: occ, average 6 pack in a week  . Drug use: No  . Sexual activity: Never  Other Topics Concern  . Not on file  Social History Narrative   On disability 2009 after prev injuries and CAD.     Married  1976   2 kids, 4 grandkids.    Social Determinants of Health   Financial Resource Strain:   . Difficulty of Paying Living Expenses: Not on file  Food Insecurity:   . Worried About Charity fundraiser in the Last Year: Not on file  . Ran Out of Food in the Last Year: Not on file  Transportation Needs:   . Lack of Transportation (Medical): Not on file  . Lack of Transportation (Non-Medical): Not on file  Physical Activity:   . Days of Exercise per Week: Not on file  . Minutes of Exercise per Session: Not on file  Stress:   . Feeling of Stress : Not on file  Social Connections:   . Frequency of Communication with Friends and Family: Not on file  . Frequency of Social Gatherings with Friends and Family: Not on file  . Attends Religious Services: Not on file  . Active Member of Clubs or Organizations: Not on file  . Attends Archivist Meetings: Not on file  . Marital Status: Not on file  Intimate Partner Violence:   . Fear of Current or Ex-Partner: Not on file  . Emotionally Abused: Not on file  . Physically Abused: Not on file  .  Sexually Abused: Not on file     FAMILY HISTORY: Family History  Problem Relation Age of Onset  . Dementia Mother   . Heart disease Father   . Colon cancer Neg Hx   . Prostate cancer Neg Hx   . Diabetes Neg Hx    ALLERGIES:  is allergic to lipitor [atorvastatin calcium].  MEDICATIONS: PHYSICAL EXAMINATION: Current Outpatient Medications on File Prior to Visit  Medication Sig Dispense Refill  . albuterol (VENTOLIN HFA) 108 (90 Base) MCG/ACT inhaler Inhale 2 puffs into the lungs every 6 (six) hours as needed for wheezing or shortness of breath. 18 g 0  . BD INSULIN SYRINGE U/F 31G X 5/16" 0.5 ML MISC USE DAILY AS DIRECTED. DX E11.9 100 each 1  . cephALEXin (KEFLEX) 500 MG capsule Take 1 capsule (500 mg total) by mouth 3 (three) times daily. 21 capsule 0  . dabrafenib mesylate (TAFINLAR) 75 MG capsule Take 2 capsules (150 mg total) by  mouth 2 (two) times daily. Take on an empty stomach 1 hour before or 2 hours after meals. 120 capsule 3  . DULoxetine (CYMBALTA) 30 MG capsule Take 1 capsule (30 mg total) by mouth daily.    [name] glucose blood (ONE TOUCH ULTRA TEST) test strip USE TO TEST GLUCOSE LEVELS 4 (FOUR) TIMES DAILY. 400 each 3  . insulin detemir (LEVEMIR) 100 UNIT/ML injection Inject 0.4-0.6 mLs (40-60 Units total) into the skin at bedtime. Start at 40 units at night.  Add 1 unit per day until AM sugar is ~150 10 mL 5  . insulin lispro (HUMALOG) 100 UNIT/ML injection Inject 0.2 mLs (20 Units total) into the skin 3 (three) times daily with meals. 10 mL 5  . metFORMIN (GLUCOPHAGE) 500 MG tablet TAKE 1 TABLET (500 MG TOTAL) BY MOUTH 2 (TWO) TIMES DAILY WITH A MEAL. 180 tablet 1  . metoprolol tartrate (LOPRESSOR) 100 MG tablet Take 0.5 tablets (50 mg total) by mouth daily.    . ondansetron (ZOFRAN) 8 MG tablet Take 1 tablet (8 mg total) by mouth every 8 (eight) hours as needed for nausea or vomiting. 45 tablet 0  . rosuvastatin (CRESTOR) 10 MG tablet TAKE 1 TABLET (10 MG TOTAL) BY MOUTH DAILY. 90 tablet 3  . trametinib dimethyl sulfoxide (MEKINIST) 2 MG tablet Take 1 tablet (2 mg total) by mouth daily. Take 1 hour before or 2 hours after a meal. Store refrigerated in original container. 30 tablet 3  . dronabinol (MARINOL) 5 MG capsule Take 1 capsule (5 mg total) by mouth 2 (two) times daily before a meal. (Patient not taking: Reported on 12/21/2018) 60 capsule 0   No current facility-administered medications on file prior to visit.    ECOG PERFORMANCE STATUS:2  Today's Vitals   12/21/18 1006 12/21/18 1010  BP: (!) 142/61   Pulse: (!) 118   Resp: 18   Temp: 97.8 F (36.6 C)   TempSrc: Tympanic   SpO2: 98%   Weight: 187 lb 3.2 oz (84.9 kg)   PainSc:  0-No pain   Body mass index is 25.39 kg/m.  Physical Exam Constitutional:      General: He is not in acute distress.    Appearance: He is ill-appearing.     Comments:  Walks independently  HENT:     Head: Normocephalic and atraumatic.  Eyes:     General: No scleral icterus.    Pupils: Pupils are equal, round, and reactive to light.  Cardiovascular:     Rate and Rhythm:  Regular rhythm. Tachycardia present.     Heart sounds: Normal heart sounds.  Pulmonary:     Effort: Pulmonary effort is normal. No respiratory distress.     Breath sounds: No wheezing.     Comments: Decreased breathing sounds left lower lobe.   Abdominal:     General: Bowel sounds are normal. There is no distension.     Palpations: Abdomen is soft. There is no mass.     Tenderness: There is no abdominal tenderness.  Musculoskeletal:        General: No deformity. Normal range of motion.     Cervical back: Normal range of motion and neck supple.  Skin:    General: Skin is warm and dry.     Findings: No erythema or rash.  Neurological:     Mental Status: He is alert and oriented to person, place, and time.     Cranial Nerves: No cranial nerve deficit.     Coordination: Coordination normal.  Psychiatric:        Behavior: Behavior normal.        Thought Content: Thought content normal.     LABORATORY DATA:  I have reviewed the data as listed Lab Results  Component Value Date   WBC 18.9 (H) 11/09/2018   HGB 11.0 (L) 11/09/2018   HCT 33.5 (L) 11/09/2018   MCV 89.8 11/09/2018   PLT 581 (H) 11/09/2018   Recent Labs    10/19/18 0841 11/02/18 1238 11/09/18 0833  NA 137 132* 131*  K 4.0 4.0 3.5  CL 104 95* 94*  CO2 '24 22 26  ' GLUCOSE 252* 425* 361*  BUN '14 15 12  ' CREATININE 0.61 0.84 0.77  CALCIUM 8.5* 8.5* 8.0*  GFRNONAA >60 >60 >60  GFRAA >60 >60 >60  PROT 6.0* 7.0 6.5  ALBUMIN 3.0* 2.6* 2.3*  AST 14* 11* 12*  ALT '10 10 9  ' ALKPHOS 69 75 63  BILITOT 0.5 0.9 0.8   Iron/TIBC/Ferritin/ %Sat No results found for: IRON, TIBC, FERRITIN, IRONPCTSAT    RADIOGRAPHIC STUDIES: I have personally reviewed the radiological images as listed and agreed with the findings in  the report.   DG Chest 2 View  Result Date: 12/19/2018 CLINICAL DATA:  History of lung carcinoma EXAM: CHEST - 2 VIEW COMPARISON:  11/16/2018 FINDINGS: Left-sided pleural effusion is again seen. Some linear scarring is noted in the left lung. Postsurgical changes are noted and stable. Cardiac shadow is within normal limits. Right lung is clear. IMPRESSION: Stable left pleural effusion and scarring. No acute abnormality noted. Electronically Signed   By: Inez Catalina M.D.   On: 12/19/2018 11:55   DG Chest 2 View  Result Date: 11/16/2018 CLINICAL DATA:  Follow-up pleural effusion.  11/02/2018 EXAM: CHEST - 2 VIEW COMPARISON:  11/02/2018. FINDINGS: Previous median sternotomy and CABG procedure. Normal heart size. There is asymmetric left lung volume loss. No change in left pleural effusion with left chest tube in place. No pneumothorax identified. Interstitial and airspace opacities within the aerated portions of the left lung appear unchanged. Right lung is hyperinflated but clear. IMPRESSION: 1. Stable position of left chest tube. No change in left pleural effusion and aeration in the left lung. Electronically Signed   By: Kerby Moors M.D.   On: 11/16/2018 08:34   DG Chest 2 View  Result Date: 11/02/2018 CLINICAL DATA:  Pleural effusion with pleural catheter. EXAM: CHEST - 2 VIEW COMPARISON:  10/15/2018 FINDINGS: Evidence of patient's PleurX catheter over the left base. Stable  to slightly worse moderate size left pleural effusion likely with associated basilar atelectasis. Patchy opacification over the left lung which may be due to atelectasis or infection. Right lung is clear. Cardiomediastinal silhouette and remainder of the exam is unchanged. IMPRESSION: 1. Stable to slightly worse moderate size left pleural effusion likely with associated basilar atelectasis. PleurX drainage catheter present over the left base. 2. Slight worsening hazy patchy opacification over the left lung which may be due to  atelectasis or infection. Electronically Signed   By: Marin Olp M.D.   On: 11/02/2018 09:23   DG Chest 2 View  Result Date: 10/15/2018 CLINICAL DATA:  Follow-up for a left pleural effusion. EXAM: CHEST - 2 VIEW COMPARISON:  Chest radiograph dated 10/04/2018. FINDINGS: The heart size and mediastinal contours are not significantly changed. A left-sided thoracostomy tube terminates near the medial left lung apex, unchanged. A small a moderate left pleural effusion with associated atelectasis/airspace disease is not significantly changed. The right lung is clear without pleural effusion. There is no pneumothorax. Median sternotomy wires are redemonstrated. Soft tissue gas overlying the left chest and axilla has decreased. IMPRESSION: Unchanged left-sided thoracostomy tube with unchanged left pleural effusion and associated atelectasis/airspace disease. Electronically Signed   By: Zerita Boers M.D.   On: 10/15/2018 14:57   DG Chest 2 View  Result Date: 10/04/2018 CLINICAL DATA:  Left lung cancer, follow-up chest tube EXAM: CHEST - 2 VIEW COMPARISON:  10/02/2018 FINDINGS: Left-sided hydropneumothorax with left-sided chest tube and slight interval increase in a small left pleural effusion component with associated extensive diffuse interstitial pulmonary opacity and partial atelectasis of the left lung. There remains a small, approximately 10% left pneumothorax, possibly with a trapped left lung. The right lung is normally aerated. Increased subcutaneous emphysema about the left chest wall and neck. Status post median sternotomy and CABG. IMPRESSION: 1. Left-sided hydropneumothorax with left-sided chest tube and slight interval increase in a small left pleural effusion component with associated extensive diffuse interstitial pulmonary opacity and partial atelectasis of the left lung. 2. There remains a small, approximately 10% left pneumothorax, possibly with a trapped left lung. 3. Increased subcutaneous  emphysema about the left chest wall and neck. Electronically Signed   By: Eddie Candle M.D.   On: 10/04/2018 13:28   Chest 2 View  Result Date: 09/28/2018 CLINICAL DATA:  63 year old male under preoperative evaluation prior to scheduled surgery. Pleural effusion. Shortness of breath. EXAM: CHEST - 2 VIEW COMPARISON:  Chest x-ray 09/13/2018.  PET-CT 09/18/2018. FINDINGS: Large left-sided hydropneumothorax again noted, with small pneumothorax component in the apex of the left hemithorax and large pleural fluid component. No aerated left lung is identified, presumably secondary to underlying atelectasis. The right lung is clear. No right pleural effusion. No evidence of pulmonary edema. Cardiac silhouette is largely obscured. Status post median sternotomy for CABG. IMPRESSION: 1. Large left hydropneumothorax with probable complete atelectasis of the left lung. The hydropneumothorax is similar to recent PET-CT, although the degree of atelectasis in the left lung has increased. Electronically Signed   By: Vinnie Langton M.D.   On: 09/28/2018 16:44   CT ANGIO CHEST PE W OR WO CONTRAST  Result Date: 11/02/2018 CLINICAL DATA:  History of Lung cancer.  Rule out pulmonary embolus. EXAM: CT ANGIOGRAPHY CHEST WITH CONTRAST TECHNIQUE: Multidetector CT imaging of the chest was performed using the standard protocol during bolus administration of intravenous contrast. Multiplanar CT image reconstructions and MIPs were obtained to evaluate the vascular anatomy. CONTRAST:  62m OMNIPAQUE  IOHEXOL 350 MG/ML SOLN COMPARISON:  PET-CT from 09/18/2018 FINDINGS: Cardiovascular: Normal heart size. Mild pericardial thickening identified. Aortic atherosclerosis. Previous CABG. Main pulmonary artery appears patent. No saddle embolus. No central obstructing pulmonary artery filling defects noted. No lobar or segmental pulmonary artery filling defects identified. Mediastinum/Nodes: The trachea appears patent and is midline. Normal  appearance of the esophagus. No mediastinal, axillary adenopathy. New left supraclavicular lymph node measures 0.9 cm, image 19/5. Lungs/Pleura: Moderate changes of centrilobular emphysema. Small right pleural effusion is new from previous exam. Interval placement of left-sided chest with decrease in volume of previous large loculated left pleural effusion. Unfortunately, there is been significant interval increase in pleural thickening with enhancement overlying the entire left lung concerning for pleural spread of tumor. Pleural based tumor along the paramediastinal left upper lobe has a thickness of 2.9 cm, image 145/6. With 0.8 cm previously. There is new diffuse interstitial thickening throughout the left lung concerning for lymphangitic spread of disease. Right lung appears hyperinflated but relatively clear. Upper Abdomen: Stones identified within the gallbladder. No acute abnormality. Musculoskeletal: No chest wall abnormality. No acute or significant osseous findings. Review of the MIP images confirms the above findings. IMPRESSION: 1. No evidence for acute pulmonary embolus. 2. Interval placement of left-sided chest tube with significant decrease in volume of loculated left pleural effusion. Unfortunately, there is been marked increase in tumor rind encasing the entire left lung concerning for progression of disease. There is also been new diffuse increased interstitial thickening throughout the left lung worrisome for lymphangitic spread of disease. Aortic Atherosclerosis (ICD10-I70.0) and Emphysema (ICD10-J43.9). Electronically Signed   By: Kerby Moors M.D.   On: 11/02/2018 14:16   DG Chest Port 1 View  Result Date: 10/02/2018 CLINICAL DATA:  Postop check EXAM: PORTABLE CHEST 1 VIEW COMPARISON:  Yesterday FINDINGS: Tracheal extubation. A left chest tube remains. Moderate left pneumothorax seen at the base more than apex. Small volume pleural fluid at the left base, diminished. The right lung  remains clear. Normal heart size. CABG. IMPRESSION: Moderate left pneumothorax after pleural fluid evacuation. Electronically Signed   By: Monte Fantasia M.D.   On: 10/02/2018 09:29   DG Chest Port 1 View  Result Date: 10/01/2018 CLINICAL DATA:  Postop left-sided catheter placement for malignant pleural effusion. EXAM: PORTABLE CHEST 1 VIEW COMPARISON:  09/28/2018 FINDINGS: There is a new left-sided tunneled chest tube. One of the sideholes may be straddling the chest wall. There is a large left-sided pleural effusion. The heart size is likely stable but evaluation is limited by the presence of a large pleural effusion. The patient is status post prior median sternotomy. There is no clear pneumothorax. No acute osseous abnormality. IMPRESSION: 1. Interval placement of a tunneled left-sided PleurX catheter. The last side hole may be straddling the chest wall. This can be further evaluated with oblique imaging. 2. Large left-sided pleural effusion.  No pneumothorax. Electronically Signed   By: Constance Holster M.D.   On: 10/01/2018 09:09     ASSESSMENT & PLAN:  1. Malignant neoplasm of unspecified part of unspecified bronchus or lung ([place])   2. Weight loss   3. Uncontrolled type 2 diabetes mellitus with hyperglycemia ([place])   4. Tachycardia    #Stage IV metastatic lung adenocarcinoma TxNx M1 Omniseq showed [id number] mutation, results were discussed with patient and wife. Patient is clinically stable. He tolerates treatments.  Chest x-ray was independently viewed by me and discussed with patient.  Stable pleural effusion.  Plan to obtain CT scan  for further evaluation of treatment response in mid January. Continue dabrafenib 150 mg twice daily and trametinib 2 mg daily.   Weight loss, continue to lose weight..  Continue Marinol 5 mg twice daily.  Follow-up with palliative care. I discussed with him about establishing care with dietitian.  He agrees.  Will refer.  #Tachycardia, likely  deconditioned.  Encourage activity as tolerated and home exercise as tolerated.  Encourage oral hydration. Hyperglycemia, uncontrolled diabetes.  His diabetes medication has been adjusted by Dr. Damita Dunnings. Partially this can be secondary to dabrafenib side effects. Strict diabetic diet discussed with patient. He will discuss more with dietitian    Return of visit:  2-3 weeks. Earlie Server, MD, PhD Hematology Oncology [facility] at [facility] Pager- [phone] 11/09/2018 Sputum.

## 2018-12-21 NOTE — Progress Notes (Signed)
Patient reports fatigue has been worse for the last week.  Has been seen by PCP for finger infections.  4 pound wt loss since last documented weight.

## 2018-12-31 ENCOUNTER — Inpatient Hospital Stay: Payer: Medicare Other | Admitting: Hospice and Palliative Medicine

## 2019-01-05 ENCOUNTER — Other Ambulatory Visit: Payer: Self-pay | Admitting: Family Medicine

## 2019-01-10 ENCOUNTER — Telehealth: Payer: Self-pay | Admitting: Family Medicine

## 2019-01-10 ENCOUNTER — Other Ambulatory Visit: Payer: Self-pay

## 2019-01-10 NOTE — Telephone Encounter (Signed)
Patient advised.

## 2019-01-10 NOTE — Progress Notes (Signed)
Patient pre screened for office appointment, no questions or concerns today. Patient reminded of upcoming appointment time and date. 

## 2019-01-10 NOTE — Telephone Encounter (Signed)
Noted.  Please update me as needed.  Would still be reasonable to add 1 unit of levemir at night until his AM sugar is ~150.  Thanks.

## 2019-01-10 NOTE — Telephone Encounter (Signed)
Pt states that since last OV 12/17/2018, his sugars have improved with the new medication dosing.  BS ranges between 180-210 with regular meals.  Pt states that he seems to doing okay on the Levemir and Humalog.   Sending as Juluis Rainier to Dr Damita Dunnings.

## 2019-01-11 ENCOUNTER — Other Ambulatory Visit: Payer: Self-pay

## 2019-01-11 ENCOUNTER — Inpatient Hospital Stay (HOSPITAL_BASED_OUTPATIENT_CLINIC_OR_DEPARTMENT_OTHER): Payer: Medicare Other | Admitting: Oncology

## 2019-01-11 ENCOUNTER — Inpatient Hospital Stay: Payer: Medicare Other | Attending: Oncology

## 2019-01-11 ENCOUNTER — Inpatient Hospital Stay (HOSPITAL_BASED_OUTPATIENT_CLINIC_OR_DEPARTMENT_OTHER): Payer: Medicare Other | Admitting: Hospice and Palliative Medicine

## 2019-01-11 VITALS — BP 119/71 | HR 82 | Temp 96.9°F | Resp 18 | Wt 192.3 lb

## 2019-01-11 DIAGNOSIS — Z794 Long term (current) use of insulin: Secondary | ICD-10-CM | POA: Diagnosis not present

## 2019-01-11 DIAGNOSIS — M7989 Other specified soft tissue disorders: Secondary | ICD-10-CM | POA: Diagnosis not present

## 2019-01-11 DIAGNOSIS — I10 Essential (primary) hypertension: Secondary | ICD-10-CM | POA: Insufficient documentation

## 2019-01-11 DIAGNOSIS — E785 Hyperlipidemia, unspecified: Secondary | ICD-10-CM | POA: Diagnosis not present

## 2019-01-11 DIAGNOSIS — R634 Abnormal weight loss: Secondary | ICD-10-CM

## 2019-01-11 DIAGNOSIS — Z7189 Other specified counseling: Secondary | ICD-10-CM | POA: Diagnosis not present

## 2019-01-11 DIAGNOSIS — Z79899 Other long term (current) drug therapy: Secondary | ICD-10-CM | POA: Diagnosis not present

## 2019-01-11 DIAGNOSIS — Z5111 Encounter for antineoplastic chemotherapy: Secondary | ICD-10-CM | POA: Diagnosis not present

## 2019-01-11 DIAGNOSIS — Z515 Encounter for palliative care: Secondary | ICD-10-CM

## 2019-01-11 DIAGNOSIS — C3490 Malignant neoplasm of unspecified part of unspecified bronchus or lung: Secondary | ICD-10-CM

## 2019-01-11 DIAGNOSIS — F17210 Nicotine dependence, cigarettes, uncomplicated: Secondary | ICD-10-CM | POA: Insufficient documentation

## 2019-01-11 DIAGNOSIS — C3492 Malignant neoplasm of unspecified part of left bronchus or lung: Secondary | ICD-10-CM | POA: Insufficient documentation

## 2019-01-11 DIAGNOSIS — J910 Malignant pleural effusion: Secondary | ICD-10-CM | POA: Diagnosis not present

## 2019-01-11 DIAGNOSIS — R21 Rash and other nonspecific skin eruption: Secondary | ICD-10-CM | POA: Diagnosis not present

## 2019-01-11 DIAGNOSIS — F419 Anxiety disorder, unspecified: Secondary | ICD-10-CM | POA: Insufficient documentation

## 2019-01-11 DIAGNOSIS — E1165 Type 2 diabetes mellitus with hyperglycemia: Secondary | ICD-10-CM | POA: Insufficient documentation

## 2019-01-11 LAB — COMPREHENSIVE METABOLIC PANEL
ALT: 16 U/L (ref 0–44)
AST: 19 U/L (ref 15–41)
Albumin: 2.9 g/dL — ABNORMAL LOW (ref 3.5–5.0)
Alkaline Phosphatase: 100 U/L (ref 38–126)
Anion gap: 9 (ref 5–15)
BUN: 11 mg/dL (ref 8–23)
CO2: 26 mmol/L (ref 22–32)
Calcium: 8.8 mg/dL — ABNORMAL LOW (ref 8.9–10.3)
Chloride: 103 mmol/L (ref 98–111)
Creatinine, Ser: 0.69 mg/dL (ref 0.61–1.24)
GFR calc Af Amer: >60 mL/min (ref >60)
GFR calc non Af Amer: >60 mL/min (ref >60)
Glucose, Bld: 275 mg/dL — ABNORMAL HIGH (ref 70–99)
Potassium: 5 mmol/L (ref 3.5–5.1)
Sodium: 138 mmol/L (ref 135–145)
Total Bilirubin: 0.3 mg/dL (ref 0.3–1.2)
Total Protein: 6.5 g/dL (ref 6.5–8.1)

## 2019-01-11 LAB — CBC WITH DIFFERENTIAL/PLATELET
Abs Immature Granulocytes: 0.03 10*3/uL (ref 0.00–0.07)
Basophils Absolute: 0.1 10*3/uL (ref 0.0–0.1)
Basophils Relative: 1 %
Eosinophils Absolute: 1.3 10*3/uL — ABNORMAL HIGH (ref 0.0–0.5)
Eosinophils Relative: 21 %
HCT: 35.3 % — ABNORMAL LOW (ref 39.0–52.0)
Hemoglobin: 10.6 g/dL — ABNORMAL LOW (ref 13.0–17.0)
Immature Granulocytes: 1 %
Lymphocytes Relative: 15 %
Lymphs Abs: 0.9 10*3/uL (ref 0.7–4.0)
MCH: 26.7 pg (ref 26.0–34.0)
MCHC: 30 g/dL (ref 30.0–36.0)
MCV: 88.9 fL (ref 80.0–100.0)
Monocytes Absolute: 0.6 10*3/uL (ref 0.1–1.0)
Monocytes Relative: 10 %
Neutro Abs: 3.3 10*3/uL (ref 1.7–7.7)
Neutrophils Relative %: 52 %
Platelets: 280 10*3/uL (ref 150–400)
RBC: 3.97 MIL/uL — ABNORMAL LOW (ref 4.22–5.81)
RDW: 16 % — ABNORMAL HIGH (ref 11.5–15.5)
WBC: 6.3 10*3/uL (ref 4.0–10.5)
nRBC: 0 % (ref 0.0–0.2)

## 2019-01-11 NOTE — Progress Notes (Signed)
Patient here for follow up. Pt reports itchiness to back, arms and chest with some redness and bumps. Unsure if he can take benadryl. Breathing has gotten better per patient. He recently completed antibiotics for infection to fingers on right hand. Pt states he did not hit or smash fingers "the fingernails started getting blood under them."

## 2019-01-11 NOTE — Progress Notes (Signed)
[place]  [phone] [phone]   Name: Craig Koch Date: 01/11/2019 MRN: [id number]  DOB: 08/03/1955  Patient Care Team: Tonia Ghent, MD as PCP - General (Family Medicine) Thelma Comp, [place] ([employer]) Telford Nab, RN as Registered Nurse    REASON FOR CONSULTATION: Palliative Care consult requested for this 64 y.o. male with multiple medical problems including stage IV adenocarcinoma of the lung with history of left sided malignant pleural effusion requiring Pleurx.  Patient has opted not to pursue chemotherapy and has instead been treated on immunotherapy.  Patient has had persistent shortness of breath.  CTA of the chest on 11/02/2018 revealed marked increase in tumor encasing the entire left lung with new diffuse increased interstitial thickening concerning for disease progression and lymphangitic spread of disease.  Patient was referred to palliative care to help address goals and manage ongoing symptoms.  SOCIAL HISTORY:     reports that he has been smoking cigarettes. He has a 9.00 pack-year smoking history. He has quit using smokeless tobacco.  His smokeless tobacco use included snuff. He reports current alcohol use of about 6.0 standard drinks of alcohol per week. He reports that he does not use drugs.   Patient is married.  He lives at home with his wife.  He has a son and daughter who live nearby.  Patient used to work as a [employer] manager.  ADVANCE DIRECTIVES:  Not on file  CODE STATUS: DNR/DNI (MOST form completed on 01/11/2019)  PAST MEDICAL HISTORY: Past Medical History:  Diagnosis Date  . Anxiety   . Arthritis   . Cancer ([place])    lung  . Coronary artery disease   . Diabetes mellitus   . Dyslipidemia   . Hx of CABG   . Hyperlipidemia   . Hypertension   . Pleural effusion     PAST SURGICAL HISTORY:  Past Surgical History:  Procedure Laterality Date  . CATARACT EXTRACTION Left 09/2015    . CHEST TUBE INSERTION Left 10/01/2018   Procedure: INSERTION PLEURAL DRAINAGE CATHETER;  Surgeon: [name], MD;  Location: [facility] ORS;  Service: Thoracic;  Laterality: Left;  . CORONARY ARTERY BYPASS GRAFT  2004   (CABG with LIMA to the  LAD, SVG to OM2/OM3, SVG  to diag  . Left ankle surgery     repair of fracture  . Right lower leg surgery     rod    HEMATOLOGY/ONCOLOGY HISTORY:  Oncology History  lung adenocarcinoma  09/08/2018 Initial Diagnosis   lung adenocarcinoma   10/19/2018 -  Chemotherapy   The patient had pembrolizumab (KEYTRUDA) 200 mg in sodium chloride 0.9 % 50 mL chemo infusion, 200 mg, Intravenous, Once, 2 of 8 cycles Administration: 200 mg (10/19/2018), 200 mg (11/09/2018)  for chemotherapy treatment.      ALLERGIES:  is allergic to lipitor [atorvastatin calcium].  MEDICATIONS:  Current Outpatient Medications  Medication Sig Dispense Refill  . albuterol (VENTOLIN HFA) 108 (90 Base) MCG/ACT inhaler Inhale 2 puffs into the lungs every 6 (six) hours as needed for wheezing or shortness of breath. 18 g 0  . BD INSULIN SYRINGE U/F 31G X 5/16" 0.5 ML MISC USE DAILY AS DIRECTED. DX E11.9 100 each 1  . cephALEXin (KEFLEX) 500 MG capsule Take 1 capsule (500 mg total) by mouth 3 (three) times daily. (Patient not taking: Reported on 01/11/2019) 21 capsule 0  . dabrafenib mesylate (TAFINLAR) 75 MG capsule Take 2 capsules (150 mg total) by mouth  2 (two) times daily. Take on an empty stomach 1 hour before or 2 hours after meals. 120 capsule 3  . dronabinol (MARINOL) 5 MG capsule Take 1 capsule (5 mg total) by mouth 2 (two) times daily before a meal. 60 capsule 0  . DULoxetine (CYMBALTA) 30 MG capsule Take 1 capsule (30 mg total) by mouth daily.    [name] glucose blood (ONE TOUCH ULTRA TEST) test strip USE TO TEST GLUCOSE LEVELS 4 (FOUR) TIMES DAILY. 400 each 3  . insulin detemir (LEVEMIR) 100 UNIT/ML injection Inject 0.4-0.6 mLs (40-60 Units total) into the skin at bedtime. Start at  40 units at night.  Add 1 unit per day until AM sugar is ~150 10 mL 5  . insulin lispro (HUMALOG) 100 UNIT/ML injection Inject 0.2 mLs (20 Units total) into the skin 3 (three) times daily with meals. 10 mL 5  . metFORMIN (GLUCOPHAGE) 500 MG tablet TAKE 1 TABLET (500 MG TOTAL) BY MOUTH 2 (TWO) TIMES DAILY WITH A MEAL. 180 tablet 1  . metoprolol tartrate (LOPRESSOR) 100 MG tablet Take 0.5 tablets (50 mg total) by mouth daily.    . ondansetron (ZOFRAN) 8 MG tablet Take 1 tablet (8 mg total) by mouth every 8 (eight) hours as needed for nausea or vomiting. 45 tablet 0  . rosuvastatin (CRESTOR) 10 MG tablet TAKE 1 TABLET (10 MG TOTAL) BY MOUTH DAILY. 90 tablet 3  . trametinib dimethyl sulfoxide (MEKINIST) 2 MG tablet Take 1 tablet (2 mg total) by mouth daily. Take 1 hour before or 2 hours after a meal. Store refrigerated in original container. 30 tablet 3   No current facility-administered medications for this visit.    VITAL SIGNS: There were no vitals taken for this visit. There were no vitals filed for this visit.  Estimated body mass index is 26.08 kg/m as calculated from the following:   Height as of 12/17/18: 6' (1.829 m).   Weight as of an earlier encounter on 01/11/19: 192 lb 4.8 oz (87.2 kg).  LABS: CBC:    Component Value Date/Time   WBC 6.3 01/11/2019 1403   HGB 10.6 (L) 01/11/2019 1403   HCT 35.3 (L) 01/11/2019 1403   PLT 280 01/11/2019 1403   MCV 88.9 01/11/2019 1403   NEUTROABS 3.3 01/11/2019 1403   LYMPHSABS 0.9 01/11/2019 1403   MONOABS 0.6 01/11/2019 1403   EOSABS 1.3 (H) 01/11/2019 1403   BASOSABS 0.1 01/11/2019 1403   Comprehensive Metabolic Panel:    Component Value Date/Time   NA 138 01/11/2019 1403   K 5.0 01/11/2019 1403   CL 103 01/11/2019 1403   CO2 26 01/11/2019 1403   BUN 11 01/11/2019 1403   CREATININE 0.69 01/11/2019 1403   GLUCOSE 275 (H) 01/11/2019 1403   CALCIUM 8.8 (L) 01/11/2019 1403   AST 19 01/11/2019 1403   ALT 16 01/11/2019 1403   ALKPHOS  100 01/11/2019 1403   BILITOT 0.3 01/11/2019 1403   PROT 6.5 01/11/2019 1403   ALBUMIN 2.9 (L) 01/11/2019 1403    RADIOGRAPHIC STUDIES: DG Chest 2 View  Result Date: 12/19/2018 CLINICAL DATA:  History of lung carcinoma EXAM: CHEST - 2 VIEW COMPARISON:  11/16/2018 FINDINGS: Left-sided pleural effusion is again seen. Some linear scarring is noted in the left lung. Postsurgical changes are noted and stable. Cardiac shadow is within normal limits. Right lung is clear. IMPRESSION: Stable left pleural effusion and scarring. No acute abnormality noted. Electronically Signed   By: Inez Catalina M.D.   On:  12/19/2018 11:55    PERFORMANCE STATUS (ECOG) : 2 - Symptomatic, <50% confined to bed  Review of Systems Unless otherwise noted, a complete review of systems is negative.  Physical Exam General: NAD, frail appearing Pulmonary: Unlabored Extremities: no edema, no joint deformities Skin: no rashes Neurological: Weakness but otherwise nonfocal  IMPRESSION: Routine follow-up visit today in the clinic.  Patient's wife accompanied him today.  Patient reports that he feels like he is doing much better over the past week or 2.  Feels like his oral intake has improved.  He denies any distressing symptoms.  Does not any questions or concerns today.  Wife requested that we complete the MOST form today.  Patient says that he would not want to be resuscitated or have his life prolonged artificially machines.  He would be okay with short-term hospitalization if needed.   I completed a MOST form today. The patient and family outlined their wishes for the following treatment decisions:  Cardiopulmonary Resuscitation: Do Not Attempt Resuscitation (DNR/No CPR)  Medical Interventions: Limited Additional Interventions: Use medical treatment, IV fluids and cardiac monitoring as indicated, DO NOT USE intubation or mechanical ventilation. May consider use of less invasive airway support such as BiPAP or CPAP.  Also provide comfort measures. Transfer to the hospital if indicated. Avoid intensive care.   Antibiotics: Antibiotics if indicated  IV Fluids: IV fluids if indicated  Feeding Tube: No feeding tube    PLAN: -Continue current scope of treatment -DNR/DNI -MOST Form completed -Follow-up telephone visit in 3-4 weeks   Patient expressed understanding and was in agreement with this plan. He also understands that He can call the clinic at any time with any questions, concerns, or complaints.     Time Total: 15 minutes  Visit consisted of counseling and education dealing with the complex and emotionally intense issues of symptom management and palliative care in the setting of serious and potentially life-threatening illness.Greater than 50%  of this time was spent counseling and coordinating care related to the above assessment and plan.  Signed by: Altha Harm, PhD, NP-C

## 2019-01-12 ENCOUNTER — Encounter: Payer: Self-pay | Admitting: Oncology

## 2019-01-12 MED ORDER — TRIAMCINOLONE ACETONIDE 0.1 % EX CREA: 1.0000 "application " | TOPICAL_CREAM | Freq: Two times a day (BID) | CUTANEOUS | 0 refills | Status: DC

## 2019-01-12 NOTE — Progress Notes (Signed)
Hematology/Oncologyfollow up  note [facility] Telephone:([phone] Fax:(336) [phone]   Patient Care Team: Tonia Ghent, MD as PCP - General (Family Medicine) Thelma Comp, [place] (Optometry) Telford Nab, RN as Registered Nurse  REFERRING PROVIDER: Tonia Ghent, MD  CHIEF COMPLAINTS/REASON FOR VISIT:  Follow-up for metastatic lung adenocarcinoma  HISTORY OF PRESENTING ILLNESS:   Craig Koch is a  64 y.o.  male with PMH listed below was seen in consultation at the request of  Tonia Ghent, MD  for evaluation of malignant pleural effusion. Patient was recently admitted to [facility] due to large amount of pleural effusion, acute hypoxic respiratory failure. Patient reports feeling shortness of breath and dyspnea on exertion for several weeks and presented to emergency room.  A chest x-ray 08/23/2018 showed a large amount of left pleural effusion 08/24/2018 CT chest abdomen pelvis confirmed large left pleural effusion with near complete collapse of the left lower lobe and extensive volume loss in the lingula and a portion of the left upper lobe. No acute intra-abdominal process.  Cholelithiasis, diverticulitis, coronary atherosclerosis post CABG.  Bilateral renal cortical scarring.  Aortic atherosclerosis.  #08/24/2018 ultrasound guided diagnostic and therapeutic thoracentesis removed 1.8 L of pleural fluid. Patient subsequently felt better and was discharged to follow-up with pulmonology. 09/04/2018 patient followed up with Dr.Aleskerov chest x-ray showed recurrent moderate left pleural effusion.  Patient underwent ultrasound-guided left thoracentesis again and it drained 3 L of fluid. Pleural fluid cytology positive for adenocarcinoma. Patient was referred to me for further evaluation and management.  # PET eft sided pleural hypermetabolic activity  consistent with malignant pleural effusion. Left upper and lower lobe low-level hypermetabolic him  corresponding to areas of airspace and groundglass opacity.  This was felt to be secondary to atelectasis.  Underlying left upper lobe primary bronchogenic carcinoma cannot be excluded. Medial left upper lobe area more focal hypermetabolic area could represent a pleural disease or a site of small left upper lobe primary. No evidence of extra thoracic hypermetabolic metastatic disease.  Left sided hydro-pneumothorax within the pleural air component being new since the prior CT. Cholelithiasis.  #MRI brain showed punctate focus of enhancement within the right parietal lobe cortex suspicious for possible metastasis.  Molecular study showed BRAF V600 mutation.  INTERVAL HISTORY Craig Koch is a 64 y.o. 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  Problems and complaints are listed below:  Patient was accompanied by wife today. Patient reports that breathing difficulty has improved.  He has more strength. He has developed a rash on his back and arms and chest.  Rash is itchy and wife applied some lidocaine cream.  Rash on arms and back has improved, still has scattered rash on patient anterior chest wall.  Patient has noticed a growing bump on left lower anterior rib cage.  Nontender. He has gained weight since last visit.  Weight 192 pounds today, improved 5 pounds during the interval.  Appetite is better.  Denies any fever, chills, nausea, vomiting,  Review of Systems  Constitutional: Positive for fatigue and unexpected weight change. Negative for appetite change, chills and fever.  HENT:   Negative for hearing loss and voice change.   Eyes: Negative for eye problems and icterus.  Respiratory: Negative for chest tightness, cough and shortness of breath.   Cardiovascular: Negative for chest pain and leg swelling.  Gastrointestinal: Negative for abdominal distention and  abdominal pain.  Endocrine:  Negative for hot flashes.  Genitourinary: Negative for difficulty urinating, dysuria and frequency.   Musculoskeletal: Negative for arthralgias.  Skin: Positive for rash. Negative for itching.  Neurological: Negative for light-headedness and numbness.  Hematological: Negative for adenopathy. Does not bruise/bleed easily.  Psychiatric/Behavioral: Negative for confusion.    MEDICAL HISTORY:  Past Medical History:  Diagnosis Date  . Anxiety   . Arthritis   . Cancer ([place])    lung  . Coronary artery disease   . Diabetes mellitus   . Dyslipidemia   . Hx of CABG   . Hyperlipidemia   . Hypertension   . Pleural effusion     SURGICAL HISTORY: Past Surgical History:  Procedure Laterality Date  . CATARACT EXTRACTION Left 09/2015  . CHEST TUBE INSERTION Left 10/01/2018   Procedure: INSERTION PLEURAL DRAINAGE CATHETER;  Surgeon: [name], MD;  Location: [facility] ORS;  Service: Thoracic;  Laterality: Left;  . CORONARY ARTERY BYPASS GRAFT  2004   (CABG with LIMA to the  LAD, SVG to OM2/OM3, SVG  to diag  . Left ankle surgery     repair of fracture  . Right lower leg surgery     rod   SOCIAL HISTORY: Social History   Socioeconomic History  . Marital status: Married    Spouse name: Not on file  . Number of children: Not on file  . Years of education: Not on file  . Highest education level: Not on file  Occupational History  . Not on file  Tobacco Use  . Smoking status: Current Every Day Smoker    Packs/day: 0.20    Years: 45.00    Pack years: 9.00    Types: Cigarettes  . Smokeless tobacco: Former Systems developer    Types: Snuff  Substance and Sexual Activity  . Alcohol use: Yes    Alcohol/week: 6.0 standard drinks    Types: 6 Cans of beer per week    Comment: occ, average 6 pack in a week  . Drug use: No  . Sexual activity: Never  Other Topics Concern  . Not on file  Social History Narrative   On disability 2009 after prev injuries and CAD.     Married 1976   2 kids, 4  grandkids.    Social Determinants of Health   Financial Resource Strain:   . Difficulty of Paying Living Expenses: Not on file  Food Insecurity:   . Worried About Charity fundraiser in the Last Year: Not on file  . Ran Out of Food in the Last Year: Not on file  Transportation Needs:   . Lack of Transportation (Medical): Not on file  . Lack of Transportation (Non-Medical): Not on file  Physical Activity:   . Days of Exercise per Week: Not on file  . Minutes of Exercise per Session: Not on file  Stress:   . Feeling of Stress : Not on file  Social Connections:   . Frequency of Communication with Friends and Family: Not on file  . Frequency of Social Gatherings with Friends and Family: Not on file  . Attends Religious Services: Not on file  . Active Member of Clubs or Organizations: Not on file  . Attends Archivist Meetings: Not on file  . Marital Status: Not on file  Intimate Partner Violence:   . Fear of Current or Ex-Partner: Not on file  . Emotionally Abused: Not on file  . Physically Abused: Not on file  .  Sexually Abused: Not on file     FAMILY HISTORY: Family History  Problem Relation Age of Onset  . Dementia Mother   . Heart disease Father   . Colon cancer Neg Hx   . Prostate cancer Neg Hx   . Diabetes Neg Hx    ALLERGIES:  is allergic to lipitor [atorvastatin calcium].  MEDICATIONS: PHYSICAL EXAMINATION: Current Outpatient Medications on File Prior to Visit  Medication Sig Dispense Refill  . albuterol (VENTOLIN HFA) 108 (90 Base) MCG/ACT inhaler Inhale 2 puffs into the lungs every 6 (six) hours as needed for wheezing or shortness of breath. 18 g 0  . BD INSULIN SYRINGE U/F 31G X 5/16" 0.5 ML MISC USE DAILY AS DIRECTED. DX E11.9 100 each 1  . dabrafenib mesylate (TAFINLAR) 75 MG capsule Take 2 capsules (150 mg total) by mouth 2 (two) times daily. Take on an empty stomach 1 hour before or 2 hours after meals. 120 capsule 3  . dronabinol (MARINOL) 5 MG  capsule Take 1 capsule (5 mg total) by mouth 2 (two) times daily before a meal. 60 capsule 0  . DULoxetine (CYMBALTA) 30 MG capsule Take 1 capsule (30 mg total) by mouth daily.    [name] glucose blood (ONE TOUCH ULTRA TEST) test strip USE TO TEST GLUCOSE LEVELS 4 (FOUR) TIMES DAILY. 400 each 3  . insulin detemir (LEVEMIR) 100 UNIT/ML injection Inject 0.4-0.6 mLs (40-60 Units total) into the skin at bedtime. Start at 40 units at night.  Add 1 unit per day until AM sugar is ~150 10 mL 5  . insulin lispro (HUMALOG) 100 UNIT/ML injection Inject 0.2 mLs (20 Units total) into the skin 3 (three) times daily with meals. 10 mL 5  . metFORMIN (GLUCOPHAGE) 500 MG tablet TAKE 1 TABLET (500 MG TOTAL) BY MOUTH 2 (TWO) TIMES DAILY WITH A MEAL. 180 tablet 1  . metoprolol tartrate (LOPRESSOR) 100 MG tablet Take 0.5 tablets (50 mg total) by mouth daily.    . ondansetron (ZOFRAN) 8 MG tablet Take 1 tablet (8 mg total) by mouth every 8 (eight) hours as needed for nausea or vomiting. 45 tablet 0  . rosuvastatin (CRESTOR) 10 MG tablet TAKE 1 TABLET (10 MG TOTAL) BY MOUTH DAILY. 90 tablet 3  . trametinib dimethyl sulfoxide (MEKINIST) 2 MG tablet Take 1 tablet (2 mg total) by mouth daily. Take 1 hour before or 2 hours after a meal. Store refrigerated in original container. 30 tablet 3  . cephALEXin (KEFLEX) 500 MG capsule Take 1 capsule (500 mg total) by mouth 3 (three) times daily. (Patient not taking: Reported on 01/11/2019) 21 capsule 0   No current facility-administered medications on file prior to visit.    ECOG PERFORMANCE STATUS:2  Today's Vitals   01/11/19 1434  BP: 119/71  Pulse: 82  Resp: 18  Temp: (!) 96.9 F (36.1 C)  SpO2: 99%  Weight: 192 lb 4.8 oz (87.2 kg)  PainSc: 0-No pain   Body mass index is 26.08 kg/m.  Physical Exam Constitutional:      General: He is not in acute distress.    Appearance: He is not ill-appearing.     Comments: He walks independently  HENT:     Head: Normocephalic and  atraumatic.  Eyes:     General: No scleral icterus.    Pupils: Pupils are equal, round, and reactive to light.  Cardiovascular:     Rate and Rhythm: Normal rate and regular rhythm.     Heart sounds: Normal  heart sounds.  Pulmonary:     Effort: Pulmonary effort is normal. No respiratory distress.     Breath sounds: No wheezing.     Comments: Decreased breathing sounds left lower lobe.   Abdominal:     General: Bowel sounds are normal. There is no distension.     Palpations: Abdomen is soft. There is no mass.     Tenderness: There is no abdominal tenderness.  Musculoskeletal:        General: No deformity. Normal range of motion.     Cervical back: Normal range of motion and neck supple.  Skin:    General: Skin is warm and dry.     Findings: No erythema or rash.  Neurological:     Mental Status: He is alert and oriented to person, place, and time.     Cranial Nerves: No cranial nerve deficit.     Coordination: Coordination normal.  Psychiatric:        Behavior: Behavior normal.        Thought Content: Thought content normal.     LABORATORY DATA:  I have reviewed the data as listed Lab Results  Component Value Date   WBC 18.9 (H) 11/09/2018   HGB 11.0 (L) 11/09/2018   HCT 33.5 (L) 11/09/2018   MCV 89.8 11/09/2018   PLT 581 (H) 11/09/2018   Recent Labs    10/19/18 0841 11/02/18 1238 11/09/18 0833  NA 137 132* 131*  K 4.0 4.0 3.5  CL 104 95* 94*  CO2 _0 GLUCOSE 252* 425* 361*  BUN _1 CREATININE 0.61 0.84 0.77  CALCIUM 8.5* 8.5* 8.0*  GFRNONAA >60 >60 >60  GFRAA >60 >60 >60  PROT 6.0* 7.0 6.5  ALBUMIN 3.0* 2.6* 2.3*  AST 14* 11* 12*  ALT _2 ALKPHOS 69 75 63  BILITOT 0.5 0.9 0.8   Iron/TIBC/Ferritin/ %Sat No results found for: IRON, TIBC, FERRITIN, IRONPCTSAT    RADIOGRAPHIC STUDIES: I have personally reviewed the radiological images as listed and agreed with the findings in the report.   DG Chest 2 View  Result Date:  12/19/2018 CLINICAL DATA:  History of lung carcinoma EXAM: CHEST - 2 VIEW COMPARISON:  11/16/2018 FINDINGS: Left-sided pleural effusion is again seen. Some linear scarring is noted in the left lung. Postsurgical changes are noted and stable. Cardiac shadow is within normal limits. Right lung is clear. IMPRESSION: Stable left pleural effusion and scarring. No acute abnormality noted. Electronically Signed   By: Inez Catalina M.D.   On: 12/19/2018 11:55   DG Chest 2 View  Result Date: 11/16/2018 CLINICAL DATA:  Follow-up pleural effusion.  11/02/2018 EXAM: CHEST - 2 VIEW COMPARISON:  11/02/2018. FINDINGS: Previous median sternotomy and CABG procedure. Normal heart size. There is asymmetric left lung volume loss. No change in left pleural effusion with left chest tube in place. No pneumothorax identified. Interstitial and airspace opacities within the aerated portions of the left lung appear unchanged. Right lung is hyperinflated but clear. IMPRESSION: 1. Stable position of left chest tube. No change in left pleural effusion and aeration in the left lung. Electronically Signed   By: Kerby Moors M.D.   On: 11/16/2018 08:34   DG Chest 2 View  Result Date: 11/02/2018 CLINICAL DATA:  Pleural effusion with pleural catheter. EXAM: CHEST - 2 VIEW COMPARISON:  10/15/2018 FINDINGS: Evidence of patient's PleurX catheter over the left base. Stable to slightly worse moderate size left pleural effusion likely with associated  basilar atelectasis. Patchy opacification over the left lung which may be due to atelectasis or infection. Right lung is clear. Cardiomediastinal silhouette and remainder of the exam is unchanged. IMPRESSION: 1. Stable to slightly worse moderate size left pleural effusion likely with associated basilar atelectasis. PleurX drainage catheter present over the left base. 2. Slight worsening hazy patchy opacification over the left lung which may be due to atelectasis or infection. Electronically Signed    By: Marin Olp M.D.   On: 11/02/2018 09:23   DG Chest 2 View  Result Date: 10/15/2018 CLINICAL DATA:  Follow-up for a left pleural effusion. EXAM: CHEST - 2 VIEW COMPARISON:  Chest radiograph dated 10/04/2018. FINDINGS: The heart size and mediastinal contours are not significantly changed. A left-sided thoracostomy tube terminates near the medial left lung apex, unchanged. A small a moderate left pleural effusion with associated atelectasis/airspace disease is not significantly changed. The right lung is clear without pleural effusion. There is no pneumothorax. Median sternotomy wires are redemonstrated. Soft tissue gas overlying the left chest and axilla has decreased. IMPRESSION: Unchanged left-sided thoracostomy tube with unchanged left pleural effusion and associated atelectasis/airspace disease. Electronically Signed   By: Zerita Boers M.D.   On: 10/15/2018 14:57   CT ANGIO CHEST PE W OR WO CONTRAST  Result Date: 11/02/2018 CLINICAL DATA:  History of Lung cancer.  Rule out pulmonary embolus. EXAM: CT ANGIOGRAPHY CHEST WITH CONTRAST TECHNIQUE: Multidetector CT imaging of the chest was performed using the standard protocol during bolus administration of intravenous contrast. Multiplanar CT image reconstructions and MIPs were obtained to evaluate the vascular anatomy. CONTRAST:  45m OMNIPAQUE IOHEXOL 350 MG/ML SOLN COMPARISON:  PET-CT from 09/18/2018 FINDINGS: Cardiovascular: Normal heart size. Mild pericardial thickening identified. Aortic atherosclerosis. Previous CABG. Main pulmonary artery appears patent. No saddle embolus. No central obstructing pulmonary artery filling defects noted. No lobar or segmental pulmonary artery filling defects identified. Mediastinum/Nodes: The trachea appears patent and is midline. Normal appearance of the esophagus. No mediastinal, axillary adenopathy. New left supraclavicular lymph node measures 0.9 cm, image 19/5. Lungs/Pleura: Moderate changes of centrilobular  emphysema. Small right pleural effusion is new from previous exam. Interval placement of left-sided chest with decrease in volume of previous large loculated left pleural effusion. Unfortunately, there is been significant interval increase in pleural thickening with enhancement overlying the entire left lung concerning for pleural spread of tumor. Pleural based tumor along the paramediastinal left upper lobe has a thickness of 2.9 cm, image 145/6. With 0.8 cm previously. There is new diffuse interstitial thickening throughout the left lung concerning for lymphangitic spread of disease. Right lung appears hyperinflated but relatively clear. Upper Abdomen: Stones identified within the gallbladder. No acute abnormality. Musculoskeletal: No chest wall abnormality. No acute or significant osseous findings. Review of the MIP images confirms the above findings. IMPRESSION: 1. No evidence for acute pulmonary embolus. 2. Interval placement of left-sided chest tube with significant decrease in volume of loculated left pleural effusion. Unfortunately, there is been marked increase in tumor rind encasing the entire left lung concerning for progression of disease. There is also been new diffuse increased interstitial thickening throughout the left lung worrisome for lymphangitic spread of disease. Aortic Atherosclerosis (ICD10-I70.0) and Emphysema (ICD10-J43.9). Electronically Signed   By: TKerby MoorsM.D.   On: 11/02/2018 14:16     ASSESSMENT & PLAN:  1. Malignant neoplasm of unspecified part of unspecified bronchus or lung ([place]   2. Encounter for antineoplastic chemotherapy   3. Uncontrolled type 2 diabetes mellitus with  hyperglycemia ([place])   4. 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 Clinically patient is doing well. Tolerates dabrafenib 150 mg twice daily and trametinib 2 mg daily. Pleural effusion has  been stable and he has not required any thoracentesis. I will obtain CT chest abdomen pelvis to evaluate treatment response. Continue current regimen  #Weight loss, patient has gained weight since last visit.  Follow-up with palliative care. #Skin rash, likely cutaneous toxicity from dabrafenib and trametinib. Currently rash has improved.  Advised patient to use Kenalog cream 0.1% apply topically twice daily as needed. #Uncontrolled diabetes, glucose level 275, improved.  Continue monitor. #Rib cage bump, unknown etiology.  Pending work-up with CT Orders Placed This Encounter  Procedures  . CT Chest W Contrast    Standing Status:   Future    Standing Expiration Date:   01/11/2020    Order Specific Question:   ** REASON FOR EXAM (FREE TEXT)    Answer:   lung cancer treatment response assessment, also new left anterio rib cage mass evaluation.    Order Specific Question:   If indicated for the ordered procedure, I authorize the administration of contrast media per Radiology protocol    Answer:   Yes    Order Specific Question:   Preferred imaging location?    Answer:   [facility]    Order Specific Question:   Radiology Contrast Protocol - do NOT remove file path    Answer:   _0 [email or web address]  . CT Abdomen Pelvis W Contrast    Standing Status:   Future    Standing Expiration Date:   01/11/2020    Order Specific Question:   ** REASON FOR EXAM (FREE TEXT)    Answer:   lung cancer treatment response assessment, left rib cage mass    Order Specific Question:   If indicated for the ordered procedure, I authorize the administration of contrast media per Radiology protocol    Answer:   Yes    Order Specific Question:   Preferred imaging location?    Answer:   [facility]    Order Specific Question:   Is Oral Contrast requested for this exam?    Answer:   Yes, Per Radiology protocol    Order Specific Question:   Radiology Contrast Protocol - do NOT remove  file path    Answer:   _1 [email or web address]  . CBC with Differential    Standing Status:   Future    Standing Expiration Date:   01/11/2020  . Comprehensive metabolic panel    Standing Status:   Future    Standing Expiration Date:   01/11/2020    Return of visit:  4 weeks .  Earlie Server, MD, PhD Hematology Oncology [facility] [place]- [phone] 11/09/2018 S[place]um.

## 2019-01-14 ENCOUNTER — Inpatient Hospital Stay: Payer: Medicare Other

## 2019-01-14 ENCOUNTER — Other Ambulatory Visit: Payer: Self-pay

## 2019-01-14 NOTE — Progress Notes (Signed)
Nutrition Assessment   Reason for Assessment:   Referral from Dr Tasia Catchings   ASSESSMENT:  64 year old male with stage IV adenocarcinoma of lung.  Past medical history of CAD, DM, CABG, HLD, HTN.  Patient has decided not pursue chemotherapy but taking immunotherapy.    Spoke with patient via phone for nutrition consult.  Patient reports that appetite has improved, weight has increased and PCP has adjusted blood sugar medications and blood sugars have improved.  Reports that he is eating more and including good sources of protein (beef jerky, tuna, salmon, buttermilk, etc).  Reports that he is exercising as well to improve strength.    Medications: marinol, levemir, lispro, meformin, zofran   Labs: reviewed   Anthropometrics:   Height: 72 inches Weight: 192 lb 4.8 oz on 1/8 Noted 12/18 weight 187 lb 11/20 196 lb 10/30 200lb BMI: 26  Recent weight gain overall weight loss 4% in the last 2 1/2 months   NUTRITION DIAGNOSIS: Unintentional weight loss related to cancer and cancer related treatment side effects as evidenced by overall weight loss of 4% in the last 2 1/2 months.    INTERVENTION:  Discussed importance of good nutrition during treatment. Encouraged good sources of protein, less likely to effect blood glucose Encouraged close follow-up with PCP for medication adjustment.   Patient prefers to reach out to RD if needed in the future.  Feels that he is doing well nutritionally at this time   Next Visit: no follow-up.  Patient to call RD if needed in the future.  Contact information provided  Neera Teng B. Zenia Resides, [place], [place] Registered Dietitian [phone] (pager)

## 2019-01-21 ENCOUNTER — Telehealth: Payer: Self-pay

## 2019-01-21 NOTE — Telephone Encounter (Signed)
Patient contacted the office asking for a handicap placard. Dr. Damita Dunnings, is this ok?

## 2019-01-21 NOTE — Telephone Encounter (Signed)
Patient notified via voicemail that handicap placard is ready to be picked up and has been placed up front. Thanks!

## 2019-01-21 NOTE — Telephone Encounter (Signed)
Form signed, in my office. Please have him finish the top part with his address/signature and preference for 1 vs 2 placards.  He can take the form to the [facility].  Please mail to patient if he doesn't want to pick it up.  Thanks.

## 2019-01-22 ENCOUNTER — Telehealth: Payer: Self-pay

## 2019-01-22 ENCOUNTER — Ambulatory Visit
Admission: RE | Admit: 2019-01-22 | Discharge: 2019-01-22 | Disposition: A | Discharge status: Home / Self Care | Payer: Medicare Other | Source: Ambulatory Visit | Attending: Oncology | Admitting: Oncology

## 2019-01-22 ENCOUNTER — Inpatient Hospital Stay (HOSPITAL_BASED_OUTPATIENT_CLINIC_OR_DEPARTMENT_OTHER): Payer: Medicare Other | Admitting: Oncology

## 2019-01-22 ENCOUNTER — Encounter: Payer: Self-pay | Admitting: Oncology

## 2019-01-22 ENCOUNTER — Other Ambulatory Visit: Payer: Self-pay

## 2019-01-22 ENCOUNTER — Telehealth: Payer: Self-pay | Admitting: *Deleted

## 2019-01-22 VITALS — BP 114/71 | HR 80 | Temp 97.2°F | Resp 18 | Wt 196.8 lb

## 2019-01-22 DIAGNOSIS — E1165 Type 2 diabetes mellitus with hyperglycemia: Secondary | ICD-10-CM | POA: Diagnosis not present

## 2019-01-22 DIAGNOSIS — Z79899 Other long term (current) drug therapy: Secondary | ICD-10-CM | POA: Diagnosis not present

## 2019-01-22 DIAGNOSIS — Z794 Long term (current) use of insulin: Secondary | ICD-10-CM | POA: Diagnosis not present

## 2019-01-22 DIAGNOSIS — I10 Essential (primary) hypertension: Secondary | ICD-10-CM | POA: Diagnosis not present

## 2019-01-22 DIAGNOSIS — C3492 Malignant neoplasm of unspecified part of left bronchus or lung: Secondary | ICD-10-CM | POA: Diagnosis not present

## 2019-01-22 DIAGNOSIS — E785 Hyperlipidemia, unspecified: Secondary | ICD-10-CM | POA: Diagnosis not present

## 2019-01-22 DIAGNOSIS — M7989 Other specified soft tissue disorders: Secondary | ICD-10-CM | POA: Diagnosis not present

## 2019-01-22 DIAGNOSIS — C3490 Malignant neoplasm of unspecified part of unspecified bronchus or lung: Secondary | ICD-10-CM

## 2019-01-22 DIAGNOSIS — R21 Rash and other nonspecific skin eruption: Secondary | ICD-10-CM | POA: Diagnosis not present

## 2019-01-22 DIAGNOSIS — J910 Malignant pleural effusion: Secondary | ICD-10-CM | POA: Diagnosis not present

## 2019-01-22 DIAGNOSIS — K8020 Calculus of gallbladder without cholecystitis without obstruction: Secondary | ICD-10-CM | POA: Diagnosis not present

## 2019-01-22 MED ORDER — IOHEXOL 300 MG/ML  SOLN
100.0000 mL | Freq: Once | INTRAMUSCULAR | Status: AC | PRN
  Administered 2019-01-22: 10:00:00 100 mL via INTRAVENOUS

## 2019-01-22 NOTE — Telephone Encounter (Signed)
I can see  him for evaluation. thanks

## 2019-01-22 NOTE — Telephone Encounter (Signed)
Patient accepts appointment for 2 PM today

## 2019-01-22 NOTE — Telephone Encounter (Addendum)
Wife and patient called reporting that he is having bilateral swelling of the lower extremities from mid calf down into his feet. And reports redness of areas. Does not know if it is hot to touch and he is in the car right now so he cannot check. Asking if we or his PCP should evaluate him for this. Please advise

## 2019-01-22 NOTE — Progress Notes (Signed)
Hematology/Oncologyfollow up  note [facility] Telephone:([phone]) [phone] [phone]   Patient Care Team: Tonia Ghent, MD as PCP - General (Family Medicine) Thelma Comp, Todd Mission (Optometry) Telford Nab, RN as Registered Nurse  REFERRING PROVIDER: Tonia Ghent, MD  CHIEF COMPLAINTS/REASON FOR VISIT:  Follow-up for metastatic lung adenocarcinoma  HISTORY OF PRESENTING ILLNESS:   Craig Koch is a  64 y.o.  male with PMH listed below was seen in consultation at the request of  Tonia Ghent, MD  for evaluation of malignant pleural effusion. Patient was recently admitted to [facility] due to large amount of pleural effusion, acute hypoxic respiratory failure. Patient reports feeling shortness of breath and dyspnea on exertion for several weeks and presented to emergency room.  A chest x-ray 08/23/2018 showed a large amount of left pleural effusion 08/24/2018 CT chest abdomen pelvis confirmed large left pleural effusion with near complete collapse of the left lower lobe and extensive volume loss in the lingula and a portion of the left upper lobe. No acute intra-abdominal process.  Cholelithiasis, diverticulitis, coronary atherosclerosis post CABG.  Bilateral renal cortical scarring.  Aortic atherosclerosis.  #08/24/2018 ultrasound guided diagnostic and therapeutic thoracentesis removed 1.8 L of pleural fluid. Patient subsequently felt better and was discharged to follow-up with pulmonology. 09/04/2018 patient followed up with Dr.Aleskerov chest x-ray showed recurrent moderate left pleural effusion.  Patient underwent ultrasound-guided left thoracentesis again and it drained 3 L of fluid. Pleural fluid cytology positive for adenocarcinoma. Patient was referred to me for further evaluation and management.  # PET eft sided pleural hypermetabolic activity  consistent with malignant pleural effusion. Left upper and lower lobe low-level hypermetabolic him  corresponding to areas of airspace and groundglass opacity.  This was felt to be secondary to atelectasis.  Underlying left upper lobe primary bronchogenic carcinoma cannot be excluded. Medial left upper lobe area more focal hypermetabolic area could represent a pleural disease or a site of small left upper lobe primary. No evidence of extra thoracic hypermetabolic metastatic disease.  Left sided hydro-pneumothorax within the pleural air component being new since the prior CT. Cholelithiasis.  #MRI brain showed punctate focus of enhancement within the right parietal lobe cortex suspicious for possible metastasis.  Molecular study showed BRAF V600 mutation.  INTERVAL HISTORY Craig Koch is a 64 y.o. male who has above history reviewed by me today presents for acute visit for evaluation of lower extremity swelling.   Problems and complaints are listed below:  Patient was accompanied by wife today. Patient reports that he noticed bilateral lower extremity swelling started 3 days ago.  Swelling is better after leg elevation and is worst at the end of the day. Denies any calf tenderness, focal erythema or redness. He denies any shortness of breath, cough.  Otherwise feeling good. He has had interval CT done for evaluation of treatment response.     Review of Systems  Constitutional: Positive for fatigue. Negative for appetite change, chills, fever and unexpected weight change.  HENT:   Negative for hearing loss and voice change.   Eyes: Negative for eye problems and icterus.  Respiratory: Negative for chest tightness, cough and shortness of breath.   Cardiovascular: Negative for chest pain and leg swelling.  Gastrointestinal: Negative for abdominal distention and abdominal pain.  Endocrine: Negative for hot flashes.  Genitourinary: Negative for difficulty urinating, dysuria and frequency.   Musculoskeletal: Negative for arthralgias.  Skin: Negative for itching and rash.  Neurological:  Negative for light-headedness and numbness.  Hematological: Negative for adenopathy. Does not bruise/bleed easily.  Psychiatric/Behavioral: Negative for confusion.    MEDICAL HISTORY:  Past Medical History:  Diagnosis Date   Anxiety    Arthritis    Coronary artery disease    Diabetes mellitus    Dyslipidemia    Hx of CABG    Hyperlipidemia    Hypertension    Malignant neoplasm of unspecified part of unspecified bronchus or lung ([place]) 08/2018   Immunotherapy   Pleural effusion     SURGICAL HISTORY: Past Surgical History:  Procedure Laterality Date   CATARACT EXTRACTION Left 09/2015   CHEST TUBE INSERTION Left 10/01/2018   Procedure: INSERTION PLEURAL DRAINAGE CATHETER;  Surgeon: [name], MD;  Location: [facility] ORS;  Service: Thoracic;  Laterality: Left;   CORONARY ARTERY BYPASS GRAFT  2004   (CABG with LIMA to the  LAD, SVG to OM2/OM3, SVG  to diag   Left ankle surgery     repair of fracture   Right lower leg surgery     rod   SOCIAL HISTORY: Social History   Socioeconomic History   Marital status: Married    Spouse name: Not on file   Number of children: Not on file   Years of education: Not on file   Highest education level: Not on file  Occupational History   Not on file  Tobacco Use   Smoking status: Current Every Day Smoker    Packs/day: 0.20    Years: 45.00    Pack years: 9.00    Types: Cigarettes   Smokeless tobacco: Former Systems developer    Types: Snuff  Substance and Sexual Activity   Alcohol use: Yes    Alcohol/week: 6.0 standard drinks    Types: 6 Cans of beer per week    Comment: occ, average 6 pack in a week   Drug use: No   Sexual activity: Never  Other Topics Concern   Not on file  Social History Narrative   On disability 2009 after prev injuries and CAD.     Married 1976   2 kids, 4 grandkids.    Social Determinants of Health   Financial Resource Strain:    Difficulty of Paying Living Expenses: Not on file  Food  Insecurity:    Worried About Charity fundraiser in the Last Year: Not on file   [employer] of Food in the Last Year: Not on file  Transportation Needs:    Lack of Transportation (Medical): Not on file   Lack of Transportation (Non-Medical): Not on file  Physical Activity:    Days of Exercise per Week: Not on file   Minutes of Exercise per Session: Not on file  Stress:    Feeling of Stress : Not on file  Social Connections:    Frequency of Communication with Friends and Family: Not on file   Frequency of Social Gatherings with Friends and Family: Not on file   Attends Religious Services: Not on file   Active Member of Clubs or Organizations: Not on file   Attends Archivist Meetings: Not on file   Marital Status: Not on file  Intimate Partner Violence:    Fear of Current or Ex-Partner: Not on file   Emotionally Abused: Not on file   Physically Abused: Not on file   Sexually Abused: Not on file     FAMILY HISTORY: Family History  Problem Relation Age of Onset   Dementia Mother    Heart disease Father  Colon cancer Neg Hx    Prostate cancer Neg Hx    Diabetes Neg Hx    ALLERGIES:  is allergic to lipitor [atorvastatin calcium].  MEDICATIONS: PHYSICAL EXAMINATION: Current Outpatient Medications on File Prior to Visit  Medication Sig Dispense Refill   albuterol (VENTOLIN HFA) 108 (90 Base) MCG/ACT inhaler Inhale 2 puffs into the lungs every 6 (six) hours as needed for wheezing or shortness of breath. 18 g 0   BD INSULIN SYRINGE U/F 31G X 5/16" 0.5 ML MISC USE DAILY AS DIRECTED. DX E11.9 100 each 1   dabrafenib mesylate (TAFINLAR) 75 MG capsule Take 2 capsules (150 mg total) by mouth 2 (two) times daily. Take on an empty stomach 1 hour before or 2 hours after meals. 120 capsule 3   dronabinol (MARINOL) 5 MG capsule Take 1 capsule (5 mg total) by mouth 2 (two) times daily before a meal. 60 capsule 0   DULoxetine (CYMBALTA) 30 MG capsule Take 1  capsule (30 mg total) by mouth daily.     glucose blood (ONE TOUCH ULTRA TEST) test strip USE TO TEST GLUCOSE LEVELS 4 (FOUR) TIMES DAILY. 400 each 3   insulin detemir (LEVEMIR) 100 UNIT/ML injection Inject 0.4-0.6 mLs (40-60 Units total) into the skin at bedtime. Start at 40 units at night.  Add 1 unit per day until AM sugar is ~150 10 mL 5   insulin lispro (HUMALOG) 100 UNIT/ML injection Inject 0.2 mLs (20 Units total) into the skin 3 (three) times daily with meals. 10 mL 5   metoprolol tartrate (LOPRESSOR) 100 MG tablet Take 0.5 tablets (50 mg total) by mouth daily.     ondansetron (ZOFRAN) 8 MG tablet Take 1 tablet (8 mg total) by mouth every 8 (eight) hours as needed for nausea or vomiting. 45 tablet 0   rosuvastatin (CRESTOR) 10 MG tablet TAKE 1 TABLET (10 MG TOTAL) BY MOUTH DAILY. 90 tablet 3   trametinib dimethyl sulfoxide (MEKINIST) 2 MG tablet Take 1 tablet (2 mg total) by mouth daily. Take 1 hour before or 2 hours after a meal. Store refrigerated in original container. 30 tablet 3   triamcinolone cream (KENALOG) 0.1 % Apply 1 application topically 2 (two) times daily. 30 g 0   cephALEXin (KEFLEX) 500 MG capsule Take 1 capsule (500 mg total) by mouth 3 (three) times daily. (Patient not taking: Reported on 01/11/2019) 21 capsule 0   metFORMIN (GLUCOPHAGE) 500 MG tablet TAKE 1 TABLET (500 MG TOTAL) BY MOUTH 2 (TWO) TIMES DAILY WITH A MEAL. (Patient not taking: Reported on 01/22/2019) 180 tablet 1   No current facility-administered medications on file prior to visit.    ECOG PERFORMANCE STATUS:2  Today's Vitals   01/22/19 1414  BP: 114/71  Pulse: 80  Resp: 18  Temp: (!) 97.2 F (36.2 C)  SpO2: 99%  Weight: 196 lb 12.8 oz (89.3 kg)  PainSc: 0-No pain   Body mass index is 26.69 kg/m.  Physical Exam Constitutional:      General: He is not in acute distress.    Appearance: He is not ill-appearing.     Comments: He walks independently  HENT:     Head: Normocephalic and  atraumatic.  Eyes:     General: No scleral icterus.    Pupils: Pupils are equal, round, and reactive to light.  Cardiovascular:     Rate and Rhythm: Normal rate and regular rhythm.     Heart sounds: Normal heart sounds.  Pulmonary:  Effort: Pulmonary effort is normal. No respiratory distress.     Breath sounds: No wheezing.     Comments: Decreased breathing sounds left lower lobe.   Abdominal:     General: Bowel sounds are normal. There is no distension.     Palpations: Abdomen is soft. There is no mass.     Tenderness: There is no abdominal tenderness.  Musculoskeletal:        General: No deformity. Normal range of motion.     Cervical back: Normal range of motion and neck supple.  Skin:    General: Skin is warm and dry.     Findings: No erythema or rash.  Neurological:     Mental Status: He is alert and oriented to person, place, and time.     Cranial Nerves: No cranial nerve deficit.     Coordination: Coordination normal.  Psychiatric:        Behavior: Behavior normal.        Thought Content: Thought content normal.     LABORATORY DATA:  I have reviewed the data as listed Lab Results  Component Value Date   WBC 18.9 (H) 11/09/2018   HGB 11.0 (L) 11/09/2018   HCT 33.5 (L) 11/09/2018   MCV 89.8 11/09/2018   PLT 581 (H) 11/09/2018   Recent Labs    10/19/18 0841 11/02/18 1238 11/09/18 0833  NA 137 132* 131*  K 4.0 4.0 3.5  CL 104 95* 94*  CO2 '24 22 26  ' GLUCOSE 252* 425* 361*  BUN '14 15 12  ' CREATININE 0.61 0.84 0.77  CALCIUM 8.5* 8.5* 8.0*  GFRNONAA >60 >60 >60  GFRAA >60 >60 >60  PROT 6.0* 7.0 6.5  ALBUMIN 3.0* 2.6* 2.3*  AST 14* 11* 12*  ALT '10 10 9  ' ALKPHOS 69 75 63  BILITOT 0.5 0.9 0.8   Iron/TIBC/Ferritin/ %Sat No results found for: IRON, TIBC, FERRITIN, IRONPCTSAT    RADIOGRAPHIC STUDIES: I have personally reviewed the radiological images as listed and agreed with the findings in the report.   DG Chest 2 View  Result Date:  12/19/2018 CLINICAL DATA:  History of lung carcinoma EXAM: CHEST - 2 VIEW COMPARISON:  11/16/2018 FINDINGS: Left-sided pleural effusion is again seen. Some linear scarring is noted in the left lung. Postsurgical changes are noted and stable. Cardiac shadow is within normal limits. Right lung is clear. IMPRESSION: Stable left pleural effusion and scarring. No acute abnormality noted. Electronically Signed   By: Inez Catalina M.D.   On: 12/19/2018 11:55   DG Chest 2 View  Result Date: 11/16/2018 CLINICAL DATA:  Follow-up pleural effusion.  11/02/2018 EXAM: CHEST - 2 VIEW COMPARISON:  11/02/2018. FINDINGS: Previous median sternotomy and CABG procedure. Normal heart size. There is asymmetric left lung volume loss. No change in left pleural effusion with left chest tube in place. No pneumothorax identified. Interstitial and airspace opacities within the aerated portions of the left lung appear unchanged. Right lung is hyperinflated but clear. IMPRESSION: 1. Stable position of left chest tube. No change in left pleural effusion and aeration in the left lung. Electronically Signed   By: Kerby Moors M.D.   On: 11/16/2018 08:34   DG Chest 2 View  Result Date: 11/02/2018 CLINICAL DATA:  Pleural effusion with pleural catheter. EXAM: CHEST - 2 VIEW COMPARISON:  10/15/2018 FINDINGS: Evidence of patient's PleurX catheter over the left base. Stable to slightly worse moderate size left pleural effusion likely with associated basilar atelectasis. Patchy opacification over the left lung  which may be due to atelectasis or infection. Right lung is clear. Cardiomediastinal silhouette and remainder of the exam is unchanged. IMPRESSION: 1. Stable to slightly worse moderate size left pleural effusion likely with associated basilar atelectasis. PleurX drainage catheter present over the left base. 2. Slight worsening hazy patchy opacification over the left lung which may be due to atelectasis or infection. Electronically Signed    By: Marin Olp M.D.   On: 11/02/2018 09:23   CT Chest W Contrast  Result Date: 01/22/2019 CLINICAL DATA:  Restaging lung cancer, immunotherapy EXAM: CT CHEST, ABDOMEN, AND PELVIS WITH CONTRAST TECHNIQUE: Multidetector CT imaging of the chest, abdomen and pelvis was performed following the standard protocol during bolus administration of intravenous contrast. CONTRAST:  152m OMNIPAQUE IOHEXOL 300 MG/ML SOLN, additional oral enteric contrast COMPARISON:  CT chest angiogram, 11/02/2018, PET-CT, 09/18/2018 FINDINGS: CT CHEST FINDINGS Cardiovascular: Aortic atherosclerosis. Normal heart size. Extensive 3 vessel coronary artery calcifications and/or stents. No pericardial effusion. Mediastinum/Nodes: No enlarged mediastinal, hilar, or axillary lymph nodes. Redemonstrated subcentimeter hypodense nodule of the left lobe of the thyroid. Trachea, and esophagus demonstrate no significant findings. Lungs/Pleura: There has been a substantial interval decrease in rinded pleural thickening and interlobular septal thickening about the left lung, with mild, persistent pleural thickening throughout and a small, loculated left pleural effusion. A previously seen left-sided tunneled pleural drainage catheter has been removed. Musculoskeletal: No chest wall mass or suspicious bone lesions identified. CT ABDOMEN PELVIS FINDINGS Hepatobiliary: No solid liver abnormality is seen. Small gallstone in the gallbladder. No gallbladder wall thickening, or biliary dilatation. Pancreas: Unremarkable. No pancreatic ductal dilatation or surrounding inflammatory changes. Spleen: Normal in size without significant abnormality. Adrenals/Urinary Tract: Adrenal glands are unremarkable. Kidneys are normal, without renal calculi, solid lesion, or hydronephrosis. Bladder is unremarkable. Stomach/Bowel: Stomach is within normal limits. Appendix appears normal. No evidence of bowel wall thickening, distention, or inflammatory changes.  Vascular/Lymphatic: Severe mixed aortic atherosclerosis. No enlarged abdominal or pelvic lymph nodes. Reproductive: No mass or other abnormality. Other: No abdominal wall hernia or abnormality. No abdominopelvic ascites. Musculoskeletal: No acute or significant osseous findings. IMPRESSION: 1. Significant interval decrease in rinded pleural thickening and interlobular septal thickening about the left lung, with mild, persistent pleural thickening throughout and a small, loculated left pleural effusion. Findings are consistent with significant treatment response of pleural metastatic disease, particularly when compared to most recent CT angiogram dated 11/02/2018. 2. A previously seen left-sided tunneled pleural drainage catheter has been removed. 3. No evidence of metastatic disease in the abdomen or pelvis. 4. Cholelithiasis. 5. Coronary artery disease.  Aortic Atherosclerosis (ICD10-I70.0). Electronically Signed   By: AEddie CandleM.D.   On: 01/22/2019 10:41   CT ANGIO CHEST PE W OR WO CONTRAST  Result Date: 11/02/2018 CLINICAL DATA:  History of Lung cancer.  Rule out pulmonary embolus. EXAM: CT ANGIOGRAPHY CHEST WITH CONTRAST TECHNIQUE: Multidetector CT imaging of the chest was performed using the standard protocol during bolus administration of intravenous contrast. Multiplanar CT image reconstructions and MIPs were obtained to evaluate the vascular anatomy. CONTRAST:  737mOMNIPAQUE IOHEXOL 350 MG/ML SOLN COMPARISON:  PET-CT from 09/18/2018 FINDINGS: Cardiovascular: Normal heart size. Mild pericardial thickening identified. Aortic atherosclerosis. Previous CABG. Main pulmonary artery appears patent. No saddle embolus. No central obstructing pulmonary artery filling defects noted. No lobar or segmental pulmonary artery filling defects identified. Mediastinum/Nodes: The trachea appears patent and is midline. Normal appearance of the esophagus. No mediastinal, axillary adenopathy. New left supraclavicular  lymph node measures 0.9 cm,  image 19/5. Lungs/Pleura: Moderate changes of centrilobular emphysema. Small right pleural effusion is new from previous exam. Interval placement of left-sided chest with decrease in volume of previous large loculated left pleural effusion. Unfortunately, there is been significant interval increase in pleural thickening with enhancement overlying the entire left lung concerning for pleural spread of tumor. Pleural based tumor along the paramediastinal left upper lobe has a thickness of 2.9 cm, image 145/6. With 0.8 cm previously. There is new diffuse interstitial thickening throughout the left lung concerning for lymphangitic spread of disease. Right lung appears hyperinflated but relatively clear. Upper Abdomen: Stones identified within the gallbladder. No acute abnormality. Musculoskeletal: No chest wall abnormality. No acute or significant osseous findings. Review of the MIP images confirms the above findings. IMPRESSION: 1. No evidence for acute pulmonary embolus. 2. Interval placement of left-sided chest tube with significant decrease in volume of loculated left pleural effusion. Unfortunately, there is been marked increase in tumor rind encasing the entire left lung concerning for progression of disease. There is also been new diffuse increased interstitial thickening throughout the left lung worrisome for lymphangitic spread of disease. Aortic Atherosclerosis (ICD10-I70.0) and Emphysema (ICD10-J43.9). Electronically Signed   By: Kerby Moors M.D.   On: 11/02/2018 14:16   CT Abdomen Pelvis W Contrast  Result Date: 01/22/2019 CLINICAL DATA:  Restaging lung cancer, immunotherapy EXAM: CT CHEST, ABDOMEN, AND PELVIS WITH CONTRAST TECHNIQUE: Multidetector CT imaging of the chest, abdomen and pelvis was performed following the standard protocol during bolus administration of intravenous contrast. CONTRAST:  159m OMNIPAQUE IOHEXOL 300 MG/ML SOLN, additional oral enteric contrast  COMPARISON:  CT chest angiogram, 11/02/2018, PET-CT, 09/18/2018 FINDINGS: CT CHEST FINDINGS Cardiovascular: Aortic atherosclerosis. Normal heart size. Extensive 3 vessel coronary artery calcifications and/or stents. No pericardial effusion. Mediastinum/Nodes: No enlarged mediastinal, hilar, or axillary lymph nodes. Redemonstrated subcentimeter hypodense nodule of the left lobe of the thyroid. Trachea, and esophagus demonstrate no significant findings. Lungs/Pleura: There has been a substantial interval decrease in rinded pleural thickening and interlobular septal thickening about the left lung, with mild, persistent pleural thickening throughout and a small, loculated left pleural effusion. A previously seen left-sided tunneled pleural drainage catheter has been removed. Musculoskeletal: No chest wall mass or suspicious bone lesions identified. CT ABDOMEN PELVIS FINDINGS Hepatobiliary: No solid liver abnormality is seen. Small gallstone in the gallbladder. No gallbladder wall thickening, or biliary dilatation. Pancreas: Unremarkable. No pancreatic ductal dilatation or surrounding inflammatory changes. Spleen: Normal in size without significant abnormality. Adrenals/Urinary Tract: Adrenal glands are unremarkable. Kidneys are normal, without renal calculi, solid lesion, or hydronephrosis. Bladder is unremarkable. Stomach/Bowel: Stomach is within normal limits. Appendix appears normal. No evidence of bowel wall thickening, distention, or inflammatory changes. Vascular/Lymphatic: Severe mixed aortic atherosclerosis. No enlarged abdominal or pelvic lymph nodes. Reproductive: No mass or other abnormality. Other: No abdominal wall hernia or abnormality. No abdominopelvic ascites. Musculoskeletal: No acute or significant osseous findings. IMPRESSION: 1. Significant interval decrease in rinded pleural thickening and interlobular septal thickening about the left lung, with mild, persistent pleural thickening throughout and a  small, loculated left pleural effusion. Findings are consistent with significant treatment response of pleural metastatic disease, particularly when compared to most recent CT angiogram dated 11/02/2018. 2. A previously seen left-sided tunneled pleural drainage catheter has been removed. 3. No evidence of metastatic disease in the abdomen or pelvis. 4. Cholelithiasis. 5. Coronary artery disease.  Aortic Atherosclerosis (ICD10-I70.0). Electronically Signed   By: AEddie CandleM.D.   On: 01/22/2019 10:41  ASSESSMENT & PLAN:  1. Malignant neoplasm of unspecified part of unspecified bronchus or lung ([facility])   2. Leg swelling    #Stage IV metastatic lung adenocarcinoma TxNx M1 Omniseq showed [id number] mutation, results were discussed with patient and wife. Clinically he is doing well.  CT chest abdomen pelvis was independent reviewed by me and discussed with patient. He has very good response to targeted therapy. Continue current regimen with dabrafenib and trametinib  #Bilateral lower extremity swelling, likely side effects from trametinib. Recommend patient to have ultrasound venous duplex bilaterally to rule out DVT If negative, recommend patient to use compression stocking and also leg elevation Diuretics can be considered if no improvement with these measures.  #Left loculated pleural effusion I discussed with Dr. Genevive Bi to see if there is any indication for drainage.  #Skin rash, likely cutaneous toxicity from dabrafenib and trametinib.  Rash is stable and controlled.  Continue   Kenalog cream 0.1% apply topically twice daily as needed.  Orders Placed This Encounter  Procedures   US Venous Img Lower Bilateral    Standing Status:   Future    Standing Expiration Date:   01/22/2020    Order Specific Question:   Reason for Exam (SYMPTOM  OR DIAGNOSIS REQUIRED)    Answer:   Bilateral leg swelling, rule out DVT    Order Specific Question:   Preferred imaging location?    Answer:   [facility]  Leonel Ramsay    Return of visit: Follow-up as planned. Earlie Server, MD, PhD Hematology Oncology [facility] [place]- [phone] 11/09/2018 Sputum.

## 2019-01-22 NOTE — Progress Notes (Signed)
Pt here with complains of swelling to both legs/ankles, no pain. Pt reports this started over the weekend on the left leg first.

## 2019-01-23 NOTE — Telephone Encounter (Signed)
SW attempted to contact family to schedule palliative care visit. No answer. SW left VM with contact information.

## 2019-01-25 ENCOUNTER — Telehealth: Payer: Self-pay

## 2019-01-25 NOTE — Telephone Encounter (Signed)
Telephone call to patient to schedule palliative care visit with patient. Patient/family in agreement with home visit on 01-31-19 at 12:00PM.

## 2019-01-27 ENCOUNTER — Other Ambulatory Visit: Payer: Self-pay | Admitting: Endocrinology

## 2019-01-29 ENCOUNTER — Ambulatory Visit
Admission: RE | Admit: 2019-01-29 | Discharge: 2019-01-29 | Disposition: A | Discharge status: Home / Self Care | Payer: Medicare Other | Source: Ambulatory Visit | Attending: Oncology | Admitting: Oncology

## 2019-01-29 ENCOUNTER — Other Ambulatory Visit: Payer: Self-pay

## 2019-01-29 DIAGNOSIS — M7989 Other specified soft tissue disorders: Secondary | ICD-10-CM | POA: Diagnosis not present

## 2019-01-29 DIAGNOSIS — R600 Localized edema: Secondary | ICD-10-CM | POA: Diagnosis not present

## 2019-01-31 ENCOUNTER — Other Ambulatory Visit: Payer: Self-pay

## 2019-01-31 ENCOUNTER — Other Ambulatory Visit: Payer: Medicare Other

## 2019-01-31 DIAGNOSIS — Z515 Encounter for palliative care: Secondary | ICD-10-CM

## 2019-01-31 NOTE — Progress Notes (Signed)
COMMUNITY PALLIATIVE CARE SW NOTE  PATIENT NAME: Craig Koch DOB: Aug 22, 1955 MRN: [id number]  PRIMARY CARE PROVIDER: Tonia Ghent, MD  RESPONSIBLE PARTY:  Acct ID - Guarantor Home Phone Work Phone Relationship Acct Type  [id number] JOSS, [phone]  Self P/F     [address], [place], [place] [address]     PLAN OF CARE and INTERVENTIONS:             1. GOALS OF CARE/ ADVANCE CARE PLANNING:  Patient is DNR. Patient has MOST form complete. Goal is to keep doing as he is doing.  2. SOCIAL/EMOTIONAL/SPIRITUAL ASSESSMENT/ INTERVENTIONS:  SW and RN met with patient in the home. Patient provided brief history. Patient was diagnosed with lung cancer in August 2020. Patient said it was very difficult at first but that he is improving now with current treatment. Patient denies any pain. Patient does get SO but not as much as before. Patient said his appetite is better and he is eating well, and gaining some weight back. Patient lives in the home with his wife and has family nearby that all help with patient needs. SW and RN discussed palliative care services with patient. Patient politely declined services at this time. SW notified admin. 3. PATIENT/CAREGIVER EDUCATION/ COPING:  Patient was alert, oriented. Patient talked openly with team and was pleasant. Patient feels he is coping well and pleased with his current treatment plan. Family is supportive.  4. PERSONAL EMERGENCY PLAN:  Patient will call 9-1-1 for emergencies. 5. COMMUNITY RESOURCES COORDINATION/ HEALTH CARE NAVIGATION:  Patient manages his care with wife support. Patient is schedule for a telephone call from Altha Harm, NP on 2/4 and an office visit with Dr. Tasia Catchings on 2/5.  6. FINANCIAL/LEGAL CONCERNS/INTERVENTIONS:  None.     SOCIAL HX:  Social History   Tobacco Use  . Smoking status: Current Every Day Smoker    Packs/day: 0.20    Years: 45.00    Pack years: 9.00    Types: Cigarettes  . Smokeless tobacco: Former Systems developer     Types: Snuff  Substance Use Topics  . Alcohol use: Yes    Alcohol/week: 6.0 standard drinks    Types: 6 Cans of beer per week    Comment: occ, average 6 pack in a week    CODE STATUS:   Code Status: Prior (DNR) ADVANCED DIRECTIVES: N MOST FORM COMPLETE:  Yes. HOSPICE EDUCATION PROVIDED: None.  PPS: Patient is independent of ADLs.  I spent 30 minutes with patient/family, from 12:00-12:30p providing education, support and consultation.   Margaretmary Lombard, LCSW

## 2019-01-31 NOTE — Progress Notes (Signed)
PATIENT NAME: DAVIDE RISDON DOB: [birth year] MRN: [id number]  PRIMARY CARE PROVIDER: Tonia Ghent, MD  RESPONSIBLE PARTY:  Acct ID - Guarantor Home Phone Work Phone Relationship Acct Type  [id number] JAYLENE, [phone]  Self P/F     [address], [place], [place] [address]    PLAN OF CARE and INTERVENTIONS:               1.  GOALS OF CARE/ ADVANCE CARE PLANNING:  Patient wants to improve and continue to receive chemotherapy.                2.  PATIENT/CAREGIVER EDUCATION:  Education on palliative care services, reviewed plan of care for treatment of lung neoplasm.               3.  DISEASE STATUS:SW and RN made scheduled palliative care home visit. Palliative care team met with patient. Patient was diagnosed with stage 4 lung cancer in August. Patient reports he lost a lot of weight and had to have fluid drained from lung 3  times. Patient had a pleurx placed and wife was able to remove fluid at home.  Tube has been removed since wife was no longer getting fluid to drain from pleurex catheter.  Patient's past medical history includes lung cancer, anxiety, arthritis, CAD, diabetes, dislipidemia, history of CABG,  hyperlipidemia, hypertension and pleural effusion. Patient report his weight went down to 178 pounds. Patient reports his weight prior to being sick was 220 lbs. Patient reports his current weight is about 196 lbs. Patient currently taking Marinol and has gained weight back. Patient is independent with ambulation. Patient scheduled to see doctor Tasia Catchings 02-08-19. Patient had a CT scan on Tuesday.  Patient denies pain at the present time. Patient states he does not feel like he needs palliative care services at the present time. Patient states as long as heas well as he is currently he does not want services.  Patient states he knows he may need services in the near future and is open to services when he declines. Palliative care team left information on palliative care and patient states he  will call back or inform doctor Tasia Catchings when he is ready for services.      HISTORY OF PRESENT ILLNESS:    CODE STATUS: DNR  ADVANCED DIRECTIVES: N MOST FORM: Yes PPS: 60%   PHYSICAL EXAM:   VITALS:There were no vitals filed for this visit.  LUNGS: shortness of breath on exertion, non productive cough CARDIAC:  EXTREMITIES:  SKIN:  No visible areas of skin breakdown NEURO: Alert and oriented x 4       Nilda Simmer, RN

## 2019-02-05 ENCOUNTER — Other Ambulatory Visit: Payer: Self-pay | Admitting: *Deleted

## 2019-02-05 ENCOUNTER — Telehealth: Payer: Self-pay | Admitting: Family Medicine

## 2019-02-05 ENCOUNTER — Other Ambulatory Visit: Payer: Self-pay | Admitting: Endocrinology

## 2019-02-05 MED ORDER — GLUCOSE BLOOD VI STRP: ORAL_STRIP | 0 refills | Status: DC

## 2019-02-05 NOTE — Telephone Encounter (Signed)
Oral Oncology Patient Advocate Encounter  Received notification from [facility] that patient has been successfully enrolled into their program to receive Donaldson from the manufacturer at $0 out of pocket until 01/03/2020.    I called and spoke with patient.  He knows we will have to re-apply.   Patient knows to call the office with questions or concerns.   Oral Oncology Clinic will continue to follow.  [place] [facility] [facility] Phone [phone] Fax [phone] 02/05/2019 10:26 AM

## 2019-02-05 NOTE — Telephone Encounter (Signed)
Rx sent for 1 refill and patient advised to see Dr. Loanne Drilling before next refill is due.

## 2019-02-05 NOTE — Telephone Encounter (Signed)
Patient called in regards to getting his test strips sent to the pharmacy  He stated that he normally goes to Dr Loanne Drilling at the endocrinology office but due to covid he has not went do they requested an appointment for the refill to be sent  He wanted to know since he comes to Dr Damita Dunnings often if this is something we can send to his pharmacy for him Patient uses the [facility]

## 2019-02-06 ENCOUNTER — Other Ambulatory Visit: Payer: Self-pay | Admitting: Family Medicine

## 2019-02-07 ENCOUNTER — Other Ambulatory Visit: Payer: Self-pay

## 2019-02-07 ENCOUNTER — Inpatient Hospital Stay: Payer: Medicare Other | Attending: Hospice and Palliative Medicine | Admitting: Hospice and Palliative Medicine

## 2019-02-07 ENCOUNTER — Encounter: Payer: Self-pay | Admitting: Oncology

## 2019-02-07 DIAGNOSIS — Z8249 Family history of ischemic heart disease and other diseases of the circulatory system: Secondary | ICD-10-CM | POA: Insufficient documentation

## 2019-02-07 DIAGNOSIS — E041 Nontoxic single thyroid nodule: Secondary | ICD-10-CM | POA: Insufficient documentation

## 2019-02-07 DIAGNOSIS — R0602 Shortness of breath: Secondary | ICD-10-CM | POA: Diagnosis not present

## 2019-02-07 DIAGNOSIS — R5383 Other fatigue: Secondary | ICD-10-CM | POA: Insufficient documentation

## 2019-02-07 DIAGNOSIS — Z515 Encounter for palliative care: Secondary | ICD-10-CM | POA: Diagnosis not present

## 2019-02-07 DIAGNOSIS — E871 Hypo-osmolality and hyponatremia: Secondary | ICD-10-CM | POA: Insufficient documentation

## 2019-02-07 DIAGNOSIS — F17210 Nicotine dependence, cigarettes, uncomplicated: Secondary | ICD-10-CM | POA: Insufficient documentation

## 2019-02-07 DIAGNOSIS — Z79899 Other long term (current) drug therapy: Secondary | ICD-10-CM | POA: Insufficient documentation

## 2019-02-07 DIAGNOSIS — E785 Hyperlipidemia, unspecified: Secondary | ICD-10-CM | POA: Insufficient documentation

## 2019-02-07 DIAGNOSIS — M13819 Other specified arthritis, unspecified shoulder: Secondary | ICD-10-CM

## 2019-02-07 DIAGNOSIS — I2510 Atherosclerotic heart disease of native coronary artery without angina pectoris: Secondary | ICD-10-CM | POA: Insufficient documentation

## 2019-02-07 DIAGNOSIS — R748 Abnormal levels of other serum enzymes: Secondary | ICD-10-CM | POA: Insufficient documentation

## 2019-02-07 DIAGNOSIS — M7989 Other specified soft tissue disorders: Secondary | ICD-10-CM | POA: Insufficient documentation

## 2019-02-07 DIAGNOSIS — Z951 Presence of aortocoronary bypass graft: Secondary | ICD-10-CM | POA: Insufficient documentation

## 2019-02-07 DIAGNOSIS — M25519 Pain in unspecified shoulder: Secondary | ICD-10-CM

## 2019-02-07 DIAGNOSIS — J90 Pleural effusion, not elsewhere classified: Secondary | ICD-10-CM | POA: Insufficient documentation

## 2019-02-07 DIAGNOSIS — C3490 Malignant neoplasm of unspecified part of unspecified bronchus or lung: Secondary | ICD-10-CM

## 2019-02-07 DIAGNOSIS — M1990 Unspecified osteoarthritis, unspecified site: Secondary | ICD-10-CM | POA: Insufficient documentation

## 2019-02-07 DIAGNOSIS — Z794 Long term (current) use of insulin: Secondary | ICD-10-CM | POA: Insufficient documentation

## 2019-02-07 DIAGNOSIS — E1165 Type 2 diabetes mellitus with hyperglycemia: Secondary | ICD-10-CM | POA: Insufficient documentation

## 2019-02-07 DIAGNOSIS — I10 Essential (primary) hypertension: Secondary | ICD-10-CM | POA: Insufficient documentation

## 2019-02-07 NOTE — Progress Notes (Signed)
Patient pre screened for office appointment, no questions or concerns today. Patient reminded of upcoming appointment time and date. 

## 2019-02-07 NOTE — Progress Notes (Signed)
Virtual Visit via Telephone Note  I connected with Craig Koch on 02/07/19 at  2:00 PM EST by telephone and verified that I am speaking with the correct person using two identifiers.   I discussed the limitations, risks, security and privacy concerns of performing an evaluation and management service by telephone and the availability of in person appointments. I also discussed with the patient that there may be a patient responsible charge related to this service. The patient expressed understanding and agreed to proceed.   History of Present Illness: Craig Koch is a 64 y.o. male with multiple medical problems including stage IV adenocarcinoma of the lung with history of left sided malignant pleural effusion requiring Pleurx.  Patient has opted not to pursue chemotherapy and has instead been treated on immunotherapy.  Patient has had persistent shortness of breath.  CTA of the chest on 11/02/2018 revealed marked increase in tumor encasing the entire left lung with new diffuse increased interstitial thickening concerning for disease progression and lymphangitic spread of disease.  Patient was referred to palliative care to help address goals and manage ongoing symptoms.   Observations/Objective: I called spoke with patient by phone.  He reports overall feeling improved over the past several weeks.  He feels like his appetite has improved as has his fatigue.  He thinks he is gaining weight.  He feels like he would like to get outside and do something if only the weather was better.  He does complain of occasional joint pains in his shoulders, which she attributes to arthritis.  The pain sometimes inhibits his ability to go to sleep at night.  Assessment and Plan: Stage IV lung cancer -on pembrolizumab.  Seems to be doing well overall.  Patient has follow-up visit tomorrow with Dr. Tasia Catchings.  Chronic pain -may be related to OA.  Discussed use of acetaminophen/ibuprofen.  Marijuana follow-up with  PCP.  Follow Up Instructions: Follow-up telephone visit 1 month   I discussed the assessment and treatment plan with the patient. The patient was provided an opportunity to ask questions and all were answered. The patient agreed with the plan and demonstrated an understanding of the instructions.   The patient was advised to call back or seek an in-person evaluation if the symptoms worsen or if the condition fails to improve as anticipated.  I provided 5 minutes of non-face-to-face time during this encounter.   Irean Hong, NP

## 2019-02-08 ENCOUNTER — Inpatient Hospital Stay (HOSPITAL_BASED_OUTPATIENT_CLINIC_OR_DEPARTMENT_OTHER): Payer: Medicare Other | Admitting: Oncology

## 2019-02-08 ENCOUNTER — Other Ambulatory Visit: Payer: Self-pay

## 2019-02-08 ENCOUNTER — Inpatient Hospital Stay: Payer: Medicare Other

## 2019-02-08 VITALS — BP 117/57 | HR 80 | Temp 95.9°F | Resp 18 | Wt 191.5 lb

## 2019-02-08 DIAGNOSIS — C3490 Malignant neoplasm of unspecified part of unspecified bronchus or lung: Secondary | ICD-10-CM | POA: Diagnosis not present

## 2019-02-08 DIAGNOSIS — E785 Hyperlipidemia, unspecified: Secondary | ICD-10-CM | POA: Diagnosis not present

## 2019-02-08 DIAGNOSIS — E1165 Type 2 diabetes mellitus with hyperglycemia: Secondary | ICD-10-CM | POA: Diagnosis not present

## 2019-02-08 DIAGNOSIS — M7989 Other specified soft tissue disorders: Secondary | ICD-10-CM

## 2019-02-08 DIAGNOSIS — Z8249 Family history of ischemic heart disease and other diseases of the circulatory system: Secondary | ICD-10-CM | POA: Diagnosis not present

## 2019-02-08 DIAGNOSIS — M1990 Unspecified osteoarthritis, unspecified site: Secondary | ICD-10-CM | POA: Diagnosis not present

## 2019-02-08 DIAGNOSIS — I2510 Atherosclerotic heart disease of native coronary artery without angina pectoris: Secondary | ICD-10-CM | POA: Diagnosis not present

## 2019-02-08 DIAGNOSIS — Z951 Presence of aortocoronary bypass graft: Secondary | ICD-10-CM | POA: Diagnosis not present

## 2019-02-08 DIAGNOSIS — F17210 Nicotine dependence, cigarettes, uncomplicated: Secondary | ICD-10-CM | POA: Diagnosis not present

## 2019-02-08 DIAGNOSIS — E871 Hypo-osmolality and hyponatremia: Secondary | ICD-10-CM | POA: Diagnosis not present

## 2019-02-08 DIAGNOSIS — R748 Abnormal levels of other serum enzymes: Secondary | ICD-10-CM

## 2019-02-08 DIAGNOSIS — R5383 Other fatigue: Secondary | ICD-10-CM | POA: Diagnosis not present

## 2019-02-08 DIAGNOSIS — E041 Nontoxic single thyroid nodule: Secondary | ICD-10-CM | POA: Diagnosis not present

## 2019-02-08 DIAGNOSIS — Z794 Long term (current) use of insulin: Secondary | ICD-10-CM | POA: Diagnosis not present

## 2019-02-08 DIAGNOSIS — Z79899 Other long term (current) drug therapy: Secondary | ICD-10-CM | POA: Diagnosis not present

## 2019-02-08 DIAGNOSIS — J90 Pleural effusion, not elsewhere classified: Secondary | ICD-10-CM | POA: Diagnosis not present

## 2019-02-08 DIAGNOSIS — Z5111 Encounter for antineoplastic chemotherapy: Secondary | ICD-10-CM | POA: Diagnosis not present

## 2019-02-08 DIAGNOSIS — I10 Essential (primary) hypertension: Secondary | ICD-10-CM | POA: Diagnosis not present

## 2019-02-08 DIAGNOSIS — R600 Localized edema: Secondary | ICD-10-CM | POA: Insufficient documentation

## 2019-02-08 LAB — CBC WITH DIFFERENTIAL/PLATELET
Abs Immature Granulocytes: 0.03 10*3/uL (ref 0.00–0.07)
Basophils Absolute: 0.1 10*3/uL (ref 0.0–0.1)
Basophils Relative: 2 %
Eosinophils Absolute: 0.8 10*3/uL — ABNORMAL HIGH (ref 0.0–0.5)
Eosinophils Relative: 13 %
HCT: 35.7 % — ABNORMAL LOW (ref 39.0–52.0)
Hemoglobin: 11.4 g/dL — ABNORMAL LOW (ref 13.0–17.0)
Immature Granulocytes: 1 %
Lymphocytes Relative: 11 %
Lymphs Abs: 0.7 10*3/uL (ref 0.7–4.0)
MCH: 27.1 pg (ref 26.0–34.0)
MCHC: 31.9 g/dL (ref 30.0–36.0)
MCV: 84.8 fL (ref 80.0–100.0)
Monocytes Absolute: 1 10*3/uL (ref 0.1–1.0)
Monocytes Relative: 15 %
Neutro Abs: 4 10*3/uL (ref 1.7–7.7)
Neutrophils Relative %: 58 %
Platelets: 375 10*3/uL (ref 150–400)
RBC: 4.21 MIL/uL — ABNORMAL LOW (ref 4.22–5.81)
RDW: 17.4 % — ABNORMAL HIGH (ref 11.5–15.5)
WBC: 6.6 10*3/uL (ref 4.0–10.5)
nRBC: 0 % (ref 0.0–0.2)

## 2019-02-08 LAB — COMPREHENSIVE METABOLIC PANEL
ALT: 13 U/L (ref 0–44)
AST: 17 U/L (ref 15–41)
Albumin: 3 g/dL — ABNORMAL LOW (ref 3.5–5.0)
Alkaline Phosphatase: 160 U/L — ABNORMAL HIGH (ref 38–126)
Anion gap: 10 (ref 5–15)
BUN: 15 mg/dL (ref 8–23)
CO2: 26 mmol/L (ref 22–32)
Calcium: 8.9 mg/dL (ref 8.9–10.3)
Chloride: 95 mmol/L — ABNORMAL LOW (ref 98–111)
Creatinine, Ser: 0.7 mg/dL (ref 0.61–1.24)
GFR calc Af Amer: >60 mL/min (ref >60)
GFR calc non Af Amer: >60 mL/min (ref >60)
Glucose, Bld: 293 mg/dL — ABNORMAL HIGH (ref 70–99)
Potassium: 3.8 mmol/L (ref 3.5–5.1)
Sodium: 131 mmol/L — ABNORMAL LOW (ref 135–145)
Total Bilirubin: 0.5 mg/dL (ref 0.3–1.2)
Total Protein: 6.8 g/dL (ref 6.5–8.1)

## 2019-02-08 NOTE — Progress Notes (Signed)
Hematology/Oncologyfollow up  note [facility] [phone] [phone]   Patient Care Team: Tonia Ghent, MD as PCP - General (Family Medicine) Thelma Comp, [place] (Optometry) Telford Nab, RN as Registered Nurse  REFERRING PROVIDER: Tonia Ghent, MD  CHIEF COMPLAINTS/REASON FOR VISIT:  Follow-up for metastatic lung adenocarcinoma  HISTORY OF PRESENTING ILLNESS:   Craig Koch is a  64 y.o.  male with PMH listed below was seen in consultation at the request of  Tonia Ghent, MD  for evaluation of malignant pleural effusion. Patient was recently admitted to [facility] due to large amount of pleural effusion, acute hypoxic respiratory failure. Patient reports feeling shortness of breath and dyspnea on exertion for several weeks and presented to emergency room.  A chest x-ray 08/23/2018 showed a large amount of left pleural effusion 08/24/2018 CT chest abdomen pelvis confirmed large left pleural effusion with near complete collapse of the left lower lobe and extensive volume loss in the lingula and a portion of the left upper lobe. No acute intra-abdominal process.  Cholelithiasis, diverticulitis, coronary atherosclerosis post CABG.  Bilateral renal cortical scarring.  Aortic atherosclerosis.  #08/24/2018 ultrasound guided diagnostic and therapeutic thoracentesis removed 1.8 L of pleural fluid. Patient subsequently felt better and was discharged to follow-up with pulmonology. 09/04/2018 patient followed up with Dr.Aleskerov chest x-ray showed recurrent moderate left pleural effusion.  Patient underwent ultrasound-guided left thoracentesis again and it drained 3 L of fluid. Pleural fluid cytology positive for adenocarcinoma. Patient was referred to me for further evaluation and management.  # PET eft sided pleural hypermetabolic activity  consistent with malignant pleural effusion. Left upper and lower lobe low-level hypermetabolic him  corresponding to areas of airspace and groundglass opacity.  This was felt to be secondary to atelectasis.  Underlying left upper lobe primary bronchogenic carcinoma cannot be excluded. Medial left upper lobe area more focal hypermetabolic area could represent a pleural disease or a site of small left upper lobe primary. No evidence of extra thoracic hypermetabolic metastatic disease.  Left sided hydro-pneumothorax within the pleural air component being new since the prior CT. Cholelithiasis.  #MRI brain showed punctate focus of enhancement within the right parietal lobe cortex suspicious for possible metastasis.  Molecular study showed BRAF V600 mutation.  INTERVAL HISTORY Craig Koch is a 65 y.o. male who has above history reviewed by me today presents for follow-up of lung cancer  Problems and complaints are listed below:  Patient was accompanied by wife today. He continues to have bilateral lower extremity edema at ankle level. Also reports mild finger digit edema. Appetite is fair. Denies any shortness of breath or cough.     Review of Systems  Constitutional: Positive for fatigue. Negative for appetite change, chills, fever and unexpected weight change.  HENT:   Negative for hearing loss and voice change.   Eyes: Negative for eye problems and icterus.  Respiratory: Negative for chest tightness, cough and shortness of breath.   Cardiovascular: Positive for leg swelling. Negative for chest pain.  Gastrointestinal: Negative for abdominal distention and abdominal pain.  Endocrine: Negative for hot flashes.  Genitourinary: Negative for difficulty urinating, dysuria and frequency.   Musculoskeletal: Negative for arthralgias.  Skin: Negative for itching and rash.  Neurological: Negative for light-headedness and numbness.  Hematological: Negative for adenopathy. Does not bruise/bleed easily.  Psychiatric/Behavioral: Negative for confusion.    MEDICAL HISTORY:  Past Medical  History:  Diagnosis Date  . Anxiety   . Arthritis   .  Coronary artery disease   . Diabetes mellitus   . Dyslipidemia   . Hx of CABG   . Hyperlipidemia   . Hypertension   . Malignant neoplasm of unspecified part of unspecified bronchus or lung ([place]) 08/2018   Immunotherapy  . Pleural effusion     SURGICAL HISTORY: Past Surgical History:  Procedure Laterality Date  . CATARACT EXTRACTION Left 09/2015  . CHEST TUBE INSERTION Left 10/01/2018   Procedure: INSERTION PLEURAL DRAINAGE CATHETER;  Surgeon: [name], MD;  Location: [facility] ORS;  Service: Thoracic;  Laterality: Left;  . CORONARY ARTERY BYPASS GRAFT  2004   (CABG with LIMA to the  LAD, SVG to OM2/OM3, SVG  to diag  . Left ankle surgery     repair of fracture  . Right lower leg surgery     rod   SOCIAL HISTORY: Social History   Socioeconomic History  . Marital status: Married    Spouse name: Not on file  . Number of children: Not on file  . Years of education: Not on file  . Highest education level: Not on file  Occupational History  . Not on file  Tobacco Use  . Smoking status: Current Every Day Smoker    Packs/day: 0.20    Years: 45.00    Pack years: 9.00    Types: Cigarettes  . Smokeless tobacco: Former Systems developer    Types: Snuff  Substance and Sexual Activity  . Alcohol use: Yes    Alcohol/week: 6.0 standard drinks    Types: 6 Cans of beer per week    Comment: occ, average 6 pack in a week  . Drug use: No  . Sexual activity: Never  Other Topics Concern  . Not on file  Social History Narrative   On disability 2009 after prev injuries and CAD.     Married 1976   2 kids, 4 grandkids.    Social Determinants of Health   Financial Resource Strain:   . Difficulty of Paying Living Expenses: Not on file  Food Insecurity:   . Worried About Charity fundraiser in the Last Year: Not on file  . Ran Out of Food in the Last Year: Not on file  Transportation Needs:   . Lack of Transportation (Medical): Not on  file  . Lack of Transportation (Non-Medical): Not on file  Physical Activity:   . Days of Exercise per Week: Not on file  . Minutes of Exercise per Session: Not on file  Stress:   . Feeling of Stress : Not on file  Social Connections:   . Frequency of Communication with Friends and Family: Not on file  . Frequency of Social Gatherings with Friends and Family: Not on file  . Attends Religious Services: Not on file  . Active Member of Clubs or Organizations: Not on file  . Attends Archivist Meetings: Not on file  . Marital Status: Not on file  Intimate Partner Violence:   . Fear of Current or Ex-Partner: Not on file  . Emotionally Abused: Not on file  . Physically Abused: Not on file  . Sexually Abused: Not on file     FAMILY HISTORY: Family History  Problem Relation Age of Onset  . Dementia Mother   . Heart disease Father   . Colon cancer Neg Hx   . Prostate cancer Neg Hx   . Diabetes Neg Hx    ALLERGIES:  is allergic to lipitor [atorvastatin calcium].  MEDICATIONS: PHYSICAL EXAMINATION: Current Outpatient  Medications on File Prior to Visit  Medication Sig Dispense Refill  . albuterol (VENTOLIN HFA) 108 (90 Base) MCG/ACT inhaler Inhale 2 puffs into the lungs every 6 (six) hours as needed for wheezing or shortness of breath. 18 g 0  . BD INSULIN SYRINGE U/F 31G X 5/16" 0.5 ML MISC USE DAILY AS DIRECTED. DX E11.9 100 each 1  . dabrafenib mesylate (TAFINLAR) 75 MG capsule Take 2 capsules (150 mg total) by mouth 2 (two) times daily. Take on an empty stomach 1 hour before or 2 hours after meals. 120 capsule 3  . dronabinol (MARINOL) 5 MG capsule Take 1 capsule (5 mg total) by mouth 2 (two) times daily before a meal. 60 capsule 0  . DULoxetine (CYMBALTA) 30 MG capsule TAKE 1 CAPSULE BY MOUTH EVERY DAY 90 capsule 1  . glucose blood (ONE TOUCH ULTRA TEST) test strip USE TO TEST GLUCOSE LEVELS 4 (FOUR) TIMES DAILY. 400 each 0  . insulin detemir (LEVEMIR) 100 UNIT/ML  injection Inject 0.4-0.6 mLs (40-60 Units total) into the skin at bedtime. Start at 40 units at night.  Add 1 unit per day until AM sugar is ~150 10 mL 5  . insulin lispro (HUMALOG) 100 UNIT/ML injection Inject 0.2 mLs (20 Units total) into the skin 3 (three) times daily with meals. 10 mL 5  . metFORMIN (GLUCOPHAGE) 500 MG tablet TAKE 1 TABLET (500 MG TOTAL) BY MOUTH 2 (TWO) TIMES DAILY WITH A MEAL. 180 tablet 1  . metoprolol tartrate (LOPRESSOR) 100 MG tablet Take 0.5 tablets (50 mg total) by mouth daily.    . ondansetron (ZOFRAN) 8 MG tablet Take 1 tablet (8 mg total) by mouth every 8 (eight) hours as needed for nausea or vomiting. 45 tablet 0  . rosuvastatin (CRESTOR) 10 MG tablet TAKE 1 TABLET (10 MG TOTAL) BY MOUTH DAILY. 90 tablet 3  . trametinib dimethyl sulfoxide (MEKINIST) 2 MG tablet Take 1 tablet (2 mg total) by mouth daily. Take 1 hour before or 2 hours after a meal. Store refrigerated in original container. 30 tablet 3  . triamcinolone cream (KENALOG) 0.1 % Apply 1 application topically 2 (two) times daily. 30 g 0   No current facility-administered medications on file prior to visit.    ECOG PERFORMANCE STATUS:2  Today's Vitals   02/08/19 1343  BP: (!) 117/57  Pulse: 80  Resp: 18  Temp: (!) 95.9 F (35.5 C)  SpO2: 97%  Weight: 191 lb 8 oz (86.9 kg)  PainSc: 0-No pain   Body mass index is 25.97 kg/m.  Physical Exam Constitutional:      General: He is not in acute distress.    Appearance: He is not ill-appearing.     Comments: He walks independently  HENT:     Head: Normocephalic and atraumatic.  Eyes:     General: No scleral icterus.    Pupils: Pupils are equal, round, and reactive to light.  Cardiovascular:     Rate and Rhythm: Normal rate and regular rhythm.     Heart sounds: Normal heart sounds.  Pulmonary:     Effort: Pulmonary effort is normal. No respiratory distress.     Breath sounds: No wheezing.     Comments: Decreased breathing sounds left lower lobe.    Abdominal:     General: Bowel sounds are normal. There is no distension.     Palpations: Abdomen is soft. There is no mass.     Tenderness: There is no abdominal tenderness.  Musculoskeletal:  General: No deformity. Normal range of motion.     Cervical back: Normal range of motion and neck supple.  Skin:    General: Skin is warm and dry.     Findings: No erythema or rash.  Neurological:     Mental Status: He is alert and oriented to person, place, and time. Mental status is at baseline.     Cranial Nerves: No cranial nerve deficit.     Coordination: Coordination normal.  Psychiatric:        Mood and Affect: Mood normal.        Behavior: Behavior normal.        Thought Content: Thought content normal.     LABORATORY DATA:  I have reviewed the data as listed Lab Results  Component Value Date   WBC 18.9 (H) 11/09/2018   HGB 11.0 (L) 11/09/2018   HCT 33.5 (L) 11/09/2018   MCV 89.8 11/09/2018   PLT 581 (H) 11/09/2018   Recent Labs    10/19/18 0841 11/02/18 1238 11/09/18 0833  NA 137 132* 131*  K 4.0 4.0 3.5  CL 104 95* 94*  CO2 '24 22 26  ' GLUCOSE 252* 425* 361*  BUN '14 15 12  ' CREATININE 0.61 0.84 0.77  CALCIUM 8.5* 8.5* 8.0*  GFRNONAA >60 >60 >60  GFRAA >60 >60 >60  PROT 6.0* 7.0 6.5  ALBUMIN 3.0* 2.6* 2.3*  AST 14* 11* 12*  ALT '10 10 9  ' ALKPHOS 69 75 63  BILITOT 0.5 0.9 0.8   Iron/TIBC/Ferritin/ %Sat No results found for: IRON, TIBC, FERRITIN, IRONPCTSAT    RADIOGRAPHIC STUDIES: I have personally reviewed the radiological images as listed and agreed with the findings in the report.   DG Chest 2 View  Result Date: 12/19/2018 CLINICAL DATA:  History of lung carcinoma EXAM: CHEST - 2 VIEW COMPARISON:  11/16/2018 FINDINGS: Left-sided pleural effusion is again seen. Some linear scarring is noted in the left lung. Postsurgical changes are noted and stable. Cardiac shadow is within normal limits. Right lung is clear. IMPRESSION: Stable left pleural  effusion and scarring. No acute abnormality noted. Electronically Signed   By: Inez Catalina M.D.   On: 12/19/2018 11:55   DG Chest 2 View  Result Date: 11/16/2018 CLINICAL DATA:  Follow-up pleural effusion.  11/02/2018 EXAM: CHEST - 2 VIEW COMPARISON:  11/02/2018. FINDINGS: Previous median sternotomy and CABG procedure. Normal heart size. There is asymmetric left lung volume loss. No change in left pleural effusion with left chest tube in place. No pneumothorax identified. Interstitial and airspace opacities within the aerated portions of the left lung appear unchanged. Right lung is hyperinflated but clear. IMPRESSION: 1. Stable position of left chest tube. No change in left pleural effusion and aeration in the left lung. Electronically Signed   By: Kerby Moors M.D.   On: 11/16/2018 08:34   CT Chest W Contrast  Result Date: 01/22/2019 CLINICAL DATA:  Restaging lung cancer, immunotherapy EXAM: CT CHEST, ABDOMEN, AND PELVIS WITH CONTRAST TECHNIQUE: Multidetector CT imaging of the chest, abdomen and pelvis was performed following the standard protocol during bolus administration of intravenous contrast. CONTRAST:  132m OMNIPAQUE IOHEXOL 300 MG/ML SOLN, additional oral enteric contrast COMPARISON:  CT chest angiogram, 11/02/2018, PET-CT, 09/18/2018 FINDINGS: CT CHEST FINDINGS Cardiovascular: Aortic atherosclerosis. Normal heart size. Extensive 3 vessel coronary artery calcifications and/or stents. No pericardial effusion. Mediastinum/Nodes: No enlarged mediastinal, hilar, or axillary lymph nodes. Redemonstrated subcentimeter hypodense nodule of the left lobe of the thyroid. Trachea, and esophagus demonstrate no  significant findings. Lungs/Pleura: There has been a substantial interval decrease in rinded pleural thickening and interlobular septal thickening about the left lung, with mild, persistent pleural thickening throughout and a small, loculated left pleural effusion. A previously seen left-sided  tunneled pleural drainage catheter has been removed. Musculoskeletal: No chest wall mass or suspicious bone lesions identified. CT ABDOMEN PELVIS FINDINGS Hepatobiliary: No solid liver abnormality is seen. Small gallstone in the gallbladder. No gallbladder wall thickening, or biliary dilatation. Pancreas: Unremarkable. No pancreatic ductal dilatation or surrounding inflammatory changes. Spleen: Normal in size without significant abnormality. Adrenals/Urinary Tract: Adrenal glands are unremarkable. Kidneys are normal, without renal calculi, solid lesion, or hydronephrosis. Bladder is unremarkable. Stomach/Bowel: Stomach is within normal limits. Appendix appears normal. No evidence of bowel wall thickening, distention, or inflammatory changes. Vascular/Lymphatic: Severe mixed aortic atherosclerosis. No enlarged abdominal or pelvic lymph nodes. Reproductive: No mass or other abnormality. Other: No abdominal wall hernia or abnormality. No abdominopelvic ascites. Musculoskeletal: No acute or significant osseous findings. IMPRESSION: 1. Significant interval decrease in rinded pleural thickening and interlobular septal thickening about the left lung, with mild, persistent pleural thickening throughout and a small, loculated left pleural effusion. Findings are consistent with significant treatment response of pleural metastatic disease, particularly when compared to most recent CT angiogram dated 11/02/2018. 2. A previously seen left-sided tunneled pleural drainage catheter has been removed. 3. No evidence of metastatic disease in the abdomen or pelvis. 4. Cholelithiasis. 5. Coronary artery disease.  Aortic Atherosclerosis (ICD10-I70.0). Electronically Signed   By: Eddie Candle M.D.   On: 01/22/2019 10:41   CT Abdomen Pelvis W Contrast  Result Date: 01/22/2019 CLINICAL DATA:  Restaging lung cancer, immunotherapy EXAM: CT CHEST, ABDOMEN, AND PELVIS WITH CONTRAST TECHNIQUE: Multidetector CT imaging of the chest, abdomen  and pelvis was performed following the standard protocol during bolus administration of intravenous contrast. CONTRAST:  193m OMNIPAQUE IOHEXOL 300 MG/ML SOLN, additional oral enteric contrast COMPARISON:  CT chest angiogram, 11/02/2018, PET-CT, 09/18/2018 FINDINGS: CT CHEST FINDINGS Cardiovascular: Aortic atherosclerosis. Normal heart size. Extensive 3 vessel coronary artery calcifications and/or stents. No pericardial effusion. Mediastinum/Nodes: No enlarged mediastinal, hilar, or axillary lymph nodes. Redemonstrated subcentimeter hypodense nodule of the left lobe of the thyroid. Trachea, and esophagus demonstrate no significant findings. Lungs/Pleura: There has been a substantial interval decrease in rinded pleural thickening and interlobular septal thickening about the left lung, with mild, persistent pleural thickening throughout and a small, loculated left pleural effusion. A previously seen left-sided tunneled pleural drainage catheter has been removed. Musculoskeletal: No chest wall mass or suspicious bone lesions identified. CT ABDOMEN PELVIS FINDINGS Hepatobiliary: No solid liver abnormality is seen. Small gallstone in the gallbladder. No gallbladder wall thickening, or biliary dilatation. Pancreas: Unremarkable. No pancreatic ductal dilatation or surrounding inflammatory changes. Spleen: Normal in size without significant abnormality. Adrenals/Urinary Tract: Adrenal glands are unremarkable. Kidneys are normal, without renal calculi, solid lesion, or hydronephrosis. Bladder is unremarkable. Stomach/Bowel: Stomach is within normal limits. Appendix appears normal. No evidence of bowel wall thickening, distention, or inflammatory changes. Vascular/Lymphatic: Severe mixed aortic atherosclerosis. No enlarged abdominal or pelvic lymph nodes. Reproductive: No mass or other abnormality. Other: No abdominal wall hernia or abnormality. No abdominopelvic ascites. Musculoskeletal: No acute or significant osseous  findings. IMPRESSION: 1. Significant interval decrease in rinded pleural thickening and interlobular septal thickening about the left lung, with mild, persistent pleural thickening throughout and a small, loculated left pleural effusion. Findings are consistent with significant treatment response of pleural metastatic disease, particularly when compared to most recent  CT angiogram dated 11/02/2018. 2. A previously seen left-sided tunneled pleural drainage catheter has been removed. 3. No evidence of metastatic disease in the abdomen or pelvis. 4. Cholelithiasis. 5. Coronary artery disease.  Aortic Atherosclerosis (ICD10-I70.0). Electronically Signed   By: Eddie Candle M.D.   On: 01/22/2019 10:41   US Venous Img Lower Bilateral  Result Date: 01/29/2019 CLINICAL DATA:  Bilateral lower extremity edema. History of pulmonary embolism and lung cancer. Evaluate for DVT. EXAM: BILATERAL LOWER EXTREMITY VENOUS DOPPLER ULTRASOUND TECHNIQUE: Gray-scale sonography with graded compression, as well as color Doppler and duplex ultrasound were performed to evaluate the lower extremity deep venous systems from the level of the common femoral vein and including the common femoral, femoral, profunda femoral, popliteal and calf veins including the posterior tibial, peroneal and gastrocnemius veins when visible. The superficial great saphenous vein was also interrogated. Spectral Doppler was utilized to evaluate flow at rest and with distal augmentation maneuvers in the common femoral, femoral and popliteal veins. COMPARISON:  None. FINDINGS: RIGHT LOWER EXTREMITY Common Femoral Vein: No evidence of thrombus. Normal compressibility, respiratory phasicity and response to augmentation. Saphenofemoral Junction: No evidence of thrombus. Normal compressibility and flow on color Doppler imaging. Profunda Femoral Vein: No evidence of thrombus. Normal compressibility and flow on color Doppler imaging. Femoral Vein: No evidence of thrombus.  Normal compressibility, respiratory phasicity and response to augmentation. Popliteal Vein: No evidence of thrombus. Normal compressibility, respiratory phasicity and response to augmentation. Calf Veins: No evidence of thrombus. Normal compressibility and flow on color Doppler imaging. Superficial Great Saphenous Vein: No evidence of thrombus. Normal compressibility. Venous Reflux:  None. Other Findings:  None. LEFT LOWER EXTREMITY Common Femoral Vein: No evidence of thrombus. Normal compressibility, respiratory phasicity and response to augmentation. Saphenofemoral Junction: No evidence of thrombus. Normal compressibility and flow on color Doppler imaging. Profunda Femoral Vein: No evidence of thrombus. Normal compressibility and flow on color Doppler imaging. Femoral Vein: No evidence of thrombus. Normal compressibility, respiratory phasicity and response to augmentation. Popliteal Vein: No evidence of thrombus. Normal compressibility, respiratory phasicity and response to augmentation. Calf Veins: No evidence of thrombus. Normal compressibility and flow on color Doppler imaging. Superficial Great Saphenous Vein: No evidence of thrombus. Normal compressibility. Venous Reflux:  None. Other Findings:  None. IMPRESSION: No evidence of DVT within either lower extremity. Electronically Signed   By: Sandi Mariscal M.D.   On: 01/29/2019 16:09     ASSESSMENT & PLAN:  1. Malignant neoplasm of unspecified part of unspecified bronchus or lung (HCC)   2. Leg swelling   3. Uncontrolled type 2 diabetes mellitus with hyperglycemia ([place])   4. Encounter for antineoplastic chemotherapy   5. Hyponatremia   6. Alkaline phosphatase elevation    #Stage IV metastatic lung adenocarcinoma TxNx M1 Omniseq showed [id number] mutation, results were discussed with patient and wife. Patient is clinically doing well. CT scan showed good treatment response. Continue current regimen with dabrafenib and trametinib  #Loculated pleural  effusion, I discussed with Dr. Genevive Bi and he recommends observation #Bilateral ankle edema.  Likely side effects from trametinib. Ultrasound lower extremity bilaterally negative for DVT. Recommend use of compression stocking. If not improved, may consider diuretics  #Uncontrolled diabetes, Blood glucose 293.  Patient uses insulin and follows up with primary care provider for diabetes control. Discussed about diabetic diet #Hyponatremia, etiology unknown.  Possible secondary to hyper glycemia.  Continue to monitor. #Elevated alkaline phosphatase.  Unknown etiology.  Repeat at the next visit.    Return of visit:  4 weeks. Earlie Server, MD, PhD Hematology Oncology [facility] at [place]- [phone] 11/09/2018 S[place].

## 2019-02-10 ENCOUNTER — Telehealth: Payer: Self-pay | Admitting: Family Medicine

## 2019-02-10 NOTE — Telephone Encounter (Signed)
Needs DM2 f/u here when possible.  Thanks.

## 2019-02-11 NOTE — Telephone Encounter (Signed)
Left detailed message on voicemail.  

## 2019-02-14 ENCOUNTER — Other Ambulatory Visit: Payer: Self-pay | Admitting: Family Medicine

## 2019-02-14 ENCOUNTER — Encounter: Payer: Self-pay | Admitting: Family Medicine

## 2019-02-14 ENCOUNTER — Ambulatory Visit (INDEPENDENT_AMBULATORY_CARE_PROVIDER_SITE_OTHER): Payer: Medicare Other | Admitting: Family Medicine

## 2019-02-14 ENCOUNTER — Other Ambulatory Visit: Payer: Self-pay

## 2019-02-14 VITALS — BP 108/62 | HR 81 | Temp 96.5°F | Wt 191.0 lb

## 2019-02-14 DIAGNOSIS — E118 Type 2 diabetes mellitus with unspecified complications: Secondary | ICD-10-CM

## 2019-02-14 DIAGNOSIS — C3490 Malignant neoplasm of unspecified part of unspecified bronchus or lung: Secondary | ICD-10-CM

## 2019-02-14 LAB — POCT GLYCOSYLATED HEMOGLOBIN (HGB A1C): Hemoglobin A1C: 10.6 % — AB (ref 4.0–5.6)

## 2019-02-14 MED ORDER — GLUCOSE BLOOD VI STRP: ORAL_STRIP | 5 refills | Status: DC

## 2019-02-14 NOTE — Patient Instructions (Signed)
20 units mealtime insulin with humalog.    In the meantime, gradually increase the levemir by 1 unit per day.   Add 1 unit per day 40----->41----->42----->43----->44----->45----->46 until your AM sugar is around 150.   When you sugar is ~150 in the AM, then continue the most recent levemir dose. That is likely the best option at this point.   If you have a sugar below 100, then cut back by 1 unit on the levemir and call us.   Update me as needed.  I want to recheck your A1c in about 3 months.   Take care.  Glad to see you.

## 2019-02-14 NOTE — Progress Notes (Signed)
This visit occurred during the SARS-CoV-2 public health emergency.  Safety protocols were in place, including screening questions prior to the visit, additional usage of staff PPE, and extensive cleaning of exam room while observing appropriate contact time as indicated for disinfecting solutions.  Diabetes:  Using medications without difficulties: yes Hypoglycemic episodes: no Hyperglycemic episodes: see below.  Feet problems: some occ tingling noted by patient.  Blood Sugars averaging: see below.   A1c elevated, d/w pt at OV.  Taking 40 units levemir at night.  Taking humalog 21 units per meal.   Sugars has been diffusely high, lowest 175, then up to 280.   We talked about his situation with cancer treatment and that likely affects his sugar.    Meds, vitals, and allergies reviewed.  ROS: Per HPI unless specifically indicated in ROS section   GEN: nad, alert and oriented HEENT: ncat NECK: supple w/o LA CV: rrr. PULM: ctab except for dec BS LLL as expected, no inc wob ABD: soft, +bs EXT: 1+ BLE edema SKIN: well perfused with chronic nail changes on the L 4th and 5th fingernails w/o acute erythema or sign of infection.

## 2019-02-14 NOTE — Assessment & Plan Note (Signed)
Per onc, d/w pt.  App onc input.

## 2019-02-14 NOTE — Assessment & Plan Note (Signed)
20 units mealtime insulin with humalog.   In the meantime, gradually increase levemir by 1 unit per day.   40----->41----->42----->43----->44----->45----->46 until AM sugar is around 150.   When sugar is ~150 in the AM, then continue the most recent levemir dose.   If you sugar below 100, then cut back by 1 unit on the levemir and call clinic.    Update me as needed.  I want to recheck his A1c in about 3 months.  He agrees.    He'll monitor his nail changes.  No intervention needed at this point.

## 2019-02-20 ENCOUNTER — Other Ambulatory Visit: Payer: Self-pay | Admitting: Family Medicine

## 2019-03-03 ENCOUNTER — Inpatient Hospital Stay
Admission: EM | Admit: 2019-03-03 | Discharge: 2019-03-19 | DRG: 637 | Disposition: A | Discharge status: Skilled Nursing Facility | Payer: Medicare Other | Attending: Internal Medicine | Admitting: Internal Medicine

## 2019-03-03 ENCOUNTER — Other Ambulatory Visit: Payer: Self-pay

## 2019-03-03 ENCOUNTER — Emergency Department: Payer: Medicare Other

## 2019-03-03 DIAGNOSIS — R0603 Acute respiratory distress: Secondary | ICD-10-CM

## 2019-03-03 DIAGNOSIS — I480 Paroxysmal atrial fibrillation: Secondary | ICD-10-CM | POA: Diagnosis not present

## 2019-03-03 DIAGNOSIS — I132 Hypertensive heart and chronic kidney disease with heart failure and with stage 5 chronic kidney disease, or end stage renal disease: Secondary | ICD-10-CM | POA: Diagnosis not present

## 2019-03-03 DIAGNOSIS — E1122 Type 2 diabetes mellitus with diabetic chronic kidney disease: Secondary | ICD-10-CM | POA: Diagnosis not present

## 2019-03-03 DIAGNOSIS — I2510 Atherosclerotic heart disease of native coronary artery without angina pectoris: Secondary | ICD-10-CM | POA: Diagnosis present

## 2019-03-03 DIAGNOSIS — E1010 Type 1 diabetes mellitus with ketoacidosis without coma: Secondary | ICD-10-CM

## 2019-03-03 DIAGNOSIS — E1310 Other specified diabetes mellitus with ketoacidosis without coma: Secondary | ICD-10-CM

## 2019-03-03 DIAGNOSIS — I472 Ventricular tachycardia: Secondary | ICD-10-CM | POA: Diagnosis not present

## 2019-03-03 DIAGNOSIS — E876 Hypokalemia: Secondary | ICD-10-CM | POA: Diagnosis not present

## 2019-03-03 DIAGNOSIS — N179 Acute kidney failure, unspecified: Secondary | ICD-10-CM | POA: Diagnosis not present

## 2019-03-03 DIAGNOSIS — E1129 Type 2 diabetes mellitus with other diabetic kidney complication: Secondary | ICD-10-CM | POA: Diagnosis not present

## 2019-03-03 DIAGNOSIS — I5023 Acute on chronic systolic (congestive) heart failure: Secondary | ICD-10-CM | POA: Diagnosis present

## 2019-03-03 DIAGNOSIS — J181 Lobar pneumonia, unspecified organism: Secondary | ICD-10-CM | POA: Diagnosis not present

## 2019-03-03 DIAGNOSIS — Z6825 Body mass index (BMI) 25.0-25.9, adult: Secondary | ICD-10-CM

## 2019-03-03 DIAGNOSIS — Z66 Do not resuscitate: Secondary | ICD-10-CM | POA: Diagnosis not present

## 2019-03-03 DIAGNOSIS — H9190 Unspecified hearing loss, unspecified ear: Secondary | ICD-10-CM | POA: Diagnosis present

## 2019-03-03 DIAGNOSIS — D631 Anemia in chronic kidney disease: Secondary | ICD-10-CM | POA: Diagnosis not present

## 2019-03-03 DIAGNOSIS — I361 Nonrheumatic tricuspid (valve) insufficiency: Secondary | ICD-10-CM | POA: Diagnosis not present

## 2019-03-03 DIAGNOSIS — M6281 Muscle weakness (generalized): Secondary | ICD-10-CM | POA: Diagnosis not present

## 2019-03-03 DIAGNOSIS — Z7401 Bed confinement status: Secondary | ICD-10-CM | POA: Diagnosis not present

## 2019-03-03 DIAGNOSIS — M79601 Pain in right arm: Secondary | ICD-10-CM

## 2019-03-03 DIAGNOSIS — F17210 Nicotine dependence, cigarettes, uncomplicated: Secondary | ICD-10-CM | POA: Diagnosis present

## 2019-03-03 DIAGNOSIS — E119 Type 2 diabetes mellitus without complications: Secondary | ICD-10-CM | POA: Diagnosis not present

## 2019-03-03 DIAGNOSIS — G9341 Metabolic encephalopathy: Secondary | ICD-10-CM | POA: Diagnosis present

## 2019-03-03 DIAGNOSIS — M6259 Muscle wasting and atrophy, not elsewhere classified, multiple sites: Secondary | ICD-10-CM | POA: Diagnosis not present

## 2019-03-03 DIAGNOSIS — Z794 Long term (current) use of insulin: Secondary | ICD-10-CM

## 2019-03-03 DIAGNOSIS — Z20822 Contact with and (suspected) exposure to covid-19: Secondary | ICD-10-CM | POA: Diagnosis not present

## 2019-03-03 DIAGNOSIS — M6389 Disorders of muscle in diseases classified elsewhere, multiple sites: Secondary | ICD-10-CM | POA: Diagnosis not present

## 2019-03-03 DIAGNOSIS — R531 Weakness: Secondary | ICD-10-CM | POA: Diagnosis not present

## 2019-03-03 DIAGNOSIS — D509 Iron deficiency anemia, unspecified: Secondary | ICD-10-CM | POA: Diagnosis present

## 2019-03-03 DIAGNOSIS — J440 Chronic obstructive pulmonary disease with acute lower respiratory infection: Secondary | ICD-10-CM | POA: Diagnosis present

## 2019-03-03 DIAGNOSIS — J189 Pneumonia, unspecified organism: Secondary | ICD-10-CM | POA: Diagnosis not present

## 2019-03-03 DIAGNOSIS — R062 Wheezing: Secondary | ICD-10-CM

## 2019-03-03 DIAGNOSIS — R2681 Unsteadiness on feet: Secondary | ICD-10-CM | POA: Diagnosis not present

## 2019-03-03 DIAGNOSIS — E1011 Type 1 diabetes mellitus with ketoacidosis with coma: Secondary | ICD-10-CM | POA: Diagnosis not present

## 2019-03-03 DIAGNOSIS — Z79899 Other long term (current) drug therapy: Secondary | ICD-10-CM

## 2019-03-03 DIAGNOSIS — R0602 Shortness of breath: Secondary | ICD-10-CM | POA: Diagnosis present

## 2019-03-03 DIAGNOSIS — M79603 Pain in arm, unspecified: Secondary | ICD-10-CM

## 2019-03-03 DIAGNOSIS — Z8249 Family history of ischemic heart disease and other diseases of the circulatory system: Secondary | ICD-10-CM

## 2019-03-03 DIAGNOSIS — Z992 Dependence on renal dialysis: Secondary | ICD-10-CM | POA: Diagnosis not present

## 2019-03-03 DIAGNOSIS — I6782 Cerebral ischemia: Secondary | ICD-10-CM | POA: Diagnosis not present

## 2019-03-03 DIAGNOSIS — Z9889 Other specified postprocedural states: Secondary | ICD-10-CM | POA: Diagnosis not present

## 2019-03-03 DIAGNOSIS — Z515 Encounter for palliative care: Secondary | ICD-10-CM | POA: Diagnosis not present

## 2019-03-03 DIAGNOSIS — E785 Hyperlipidemia, unspecified: Secondary | ICD-10-CM | POA: Diagnosis present

## 2019-03-03 DIAGNOSIS — I10 Essential (primary) hypertension: Secondary | ICD-10-CM | POA: Diagnosis not present

## 2019-03-03 DIAGNOSIS — F419 Anxiety disorder, unspecified: Secondary | ICD-10-CM | POA: Diagnosis present

## 2019-03-03 DIAGNOSIS — J910 Malignant pleural effusion: Secondary | ICD-10-CM | POA: Diagnosis not present

## 2019-03-03 DIAGNOSIS — I5022 Chronic systolic (congestive) heart failure: Secondary | ICD-10-CM | POA: Diagnosis not present

## 2019-03-03 DIAGNOSIS — I447 Left bundle-branch block, unspecified: Secondary | ICD-10-CM | POA: Diagnosis present

## 2019-03-03 DIAGNOSIS — E1110 Type 2 diabetes mellitus with ketoacidosis without coma: Secondary | ICD-10-CM | POA: Diagnosis not present

## 2019-03-03 DIAGNOSIS — E871 Hypo-osmolality and hyponatremia: Secondary | ICD-10-CM | POA: Diagnosis present

## 2019-03-03 DIAGNOSIS — J9690 Respiratory failure, unspecified, unspecified whether with hypoxia or hypercapnia: Secondary | ICD-10-CM | POA: Diagnosis not present

## 2019-03-03 DIAGNOSIS — Z9221 Personal history of antineoplastic chemotherapy: Secondary | ICD-10-CM

## 2019-03-03 DIAGNOSIS — I4891 Unspecified atrial fibrillation: Secondary | ICD-10-CM | POA: Diagnosis present

## 2019-03-03 DIAGNOSIS — I25118 Atherosclerotic heart disease of native coronary artery with other forms of angina pectoris: Secondary | ICD-10-CM | POA: Diagnosis not present

## 2019-03-03 DIAGNOSIS — J9811 Atelectasis: Secondary | ICD-10-CM | POA: Diagnosis not present

## 2019-03-03 DIAGNOSIS — J449 Chronic obstructive pulmonary disease, unspecified: Secondary | ICD-10-CM

## 2019-03-03 DIAGNOSIS — I340 Nonrheumatic mitral (valve) insufficiency: Secondary | ICD-10-CM | POA: Diagnosis not present

## 2019-03-03 DIAGNOSIS — E11649 Type 2 diabetes mellitus with hypoglycemia without coma: Secondary | ICD-10-CM | POA: Diagnosis not present

## 2019-03-03 DIAGNOSIS — R7401 Elevation of levels of liver transaminase levels: Secondary | ICD-10-CM | POA: Diagnosis present

## 2019-03-03 DIAGNOSIS — N170 Acute kidney failure with tubular necrosis: Secondary | ICD-10-CM | POA: Diagnosis not present

## 2019-03-03 DIAGNOSIS — Z87898 Personal history of other specified conditions: Secondary | ICD-10-CM | POA: Diagnosis not present

## 2019-03-03 DIAGNOSIS — E43 Unspecified severe protein-calorie malnutrition: Secondary | ICD-10-CM | POA: Diagnosis not present

## 2019-03-03 DIAGNOSIS — T68XXXA Hypothermia, initial encounter: Secondary | ICD-10-CM | POA: Diagnosis not present

## 2019-03-03 DIAGNOSIS — Z23 Encounter for immunization: Secondary | ICD-10-CM | POA: Diagnosis not present

## 2019-03-03 DIAGNOSIS — I083 Combined rheumatic disorders of mitral, aortic and tricuspid valves: Secondary | ICD-10-CM | POA: Diagnosis present

## 2019-03-03 DIAGNOSIS — Z951 Presence of aortocoronary bypass graft: Secondary | ICD-10-CM

## 2019-03-03 DIAGNOSIS — I214 Non-ST elevation (NSTEMI) myocardial infarction: Secondary | ICD-10-CM | POA: Diagnosis not present

## 2019-03-03 DIAGNOSIS — J441 Chronic obstructive pulmonary disease with (acute) exacerbation: Secondary | ICD-10-CM | POA: Diagnosis not present

## 2019-03-03 DIAGNOSIS — D649 Anemia, unspecified: Secondary | ICD-10-CM | POA: Diagnosis not present

## 2019-03-03 DIAGNOSIS — F1010 Alcohol abuse, uncomplicated: Secondary | ICD-10-CM | POA: Diagnosis present

## 2019-03-03 DIAGNOSIS — Z85118 Personal history of other malignant neoplasm of bronchus and lung: Secondary | ICD-10-CM

## 2019-03-03 DIAGNOSIS — Z743 Need for continuous supervision: Secondary | ICD-10-CM | POA: Diagnosis not present

## 2019-03-03 DIAGNOSIS — Y95 Nosocomial condition: Secondary | ICD-10-CM | POA: Diagnosis present

## 2019-03-03 DIAGNOSIS — J9601 Acute respiratory failure with hypoxia: Secondary | ICD-10-CM | POA: Diagnosis not present

## 2019-03-03 DIAGNOSIS — Z888 Allergy status to other drugs, medicaments and biological substances status: Secondary | ICD-10-CM

## 2019-03-03 DIAGNOSIS — J90 Pleural effusion, not elsewhere classified: Secondary | ICD-10-CM

## 2019-03-03 DIAGNOSIS — R0609 Other forms of dyspnea: Secondary | ICD-10-CM | POA: Diagnosis not present

## 2019-03-03 DIAGNOSIS — I5021 Acute systolic (congestive) heart failure: Secondary | ICD-10-CM

## 2019-03-03 DIAGNOSIS — N186 End stage renal disease: Secondary | ICD-10-CM | POA: Diagnosis present

## 2019-03-03 DIAGNOSIS — E1165 Type 2 diabetes mellitus with hyperglycemia: Secondary | ICD-10-CM | POA: Diagnosis not present

## 2019-03-03 DIAGNOSIS — S40021A Contusion of right upper arm, initial encounter: Secondary | ICD-10-CM | POA: Diagnosis not present

## 2019-03-03 DIAGNOSIS — Z720 Tobacco use: Secondary | ICD-10-CM | POA: Diagnosis not present

## 2019-03-03 DIAGNOSIS — I509 Heart failure, unspecified: Secondary | ICD-10-CM | POA: Diagnosis not present

## 2019-03-03 DIAGNOSIS — R4182 Altered mental status, unspecified: Secondary | ICD-10-CM

## 2019-03-03 DIAGNOSIS — N185 Chronic kidney disease, stage 5: Secondary | ICD-10-CM | POA: Diagnosis not present

## 2019-03-03 DIAGNOSIS — E872 Acidosis: Secondary | ICD-10-CM | POA: Diagnosis not present

## 2019-03-03 DIAGNOSIS — Z7189 Other specified counseling: Secondary | ICD-10-CM | POA: Diagnosis not present

## 2019-03-03 DIAGNOSIS — R34 Anuria and oliguria: Secondary | ICD-10-CM | POA: Diagnosis not present

## 2019-03-03 DIAGNOSIS — E08649 Diabetes mellitus due to underlying condition with hypoglycemia without coma: Secondary | ICD-10-CM | POA: Diagnosis not present

## 2019-03-03 DIAGNOSIS — E860 Dehydration: Secondary | ICD-10-CM | POA: Diagnosis present

## 2019-03-03 DIAGNOSIS — C3490 Malignant neoplasm of unspecified part of unspecified bronchus or lung: Secondary | ICD-10-CM | POA: Diagnosis not present

## 2019-03-03 DIAGNOSIS — M2550 Pain in unspecified joint: Secondary | ICD-10-CM | POA: Diagnosis not present

## 2019-03-03 LAB — COMPREHENSIVE METABOLIC PANEL
ALT: 18 U/L (ref 0–44)
ALT: 25 U/L (ref 0–44)
AST: 81 U/L — ABNORMAL HIGH (ref 15–41)
AST: 136 U/L — ABNORMAL HIGH (ref 15–41)
Albumin: 2.9 g/dL — ABNORMAL LOW (ref 3.5–5.0)
Albumin: 3.2 g/dL — ABNORMAL LOW (ref 3.5–5.0)
Alkaline Phosphatase: 80 U/L (ref 38–126)
Alkaline Phosphatase: 104 U/L (ref 38–126)
Anion gap: 26 — ABNORMAL HIGH (ref 5–15)
BUN: 37 mg/dL — ABNORMAL HIGH (ref 8–23)
BUN: 43 mg/dL — ABNORMAL HIGH (ref 8–23)
CO2: <7 mmol/L — ABNORMAL LOW (ref 22–32)
CO2: 8 mmol/L — ABNORMAL LOW (ref 22–32)
Calcium: 8.2 mg/dL — ABNORMAL LOW (ref 8.9–10.3)
Calcium: 8.2 mg/dL — ABNORMAL LOW (ref 8.9–10.3)
Chloride: 89 mmol/L — ABNORMAL LOW (ref 98–111)
Chloride: 89 mmol/L — ABNORMAL LOW (ref 98–111)
Creatinine, Ser: 1.6 mg/dL — ABNORMAL HIGH (ref 0.61–1.24)
Creatinine, Ser: 1.79 mg/dL — ABNORMAL HIGH (ref 0.61–1.24)
GFR calc Af Amer: 52 mL/min — ABNORMAL LOW (ref >60)
GFR calc Af Amer: 46 mL/min — ABNORMAL LOW (ref >60)
GFR calc non Af Amer: 45 mL/min — ABNORMAL LOW (ref >60)
GFR calc non Af Amer: 39 mL/min — ABNORMAL LOW (ref >60)
Glucose, Bld: 718 mg/dL (ref 70–99)
Glucose, Bld: 756 mg/dL (ref 70–99)
Potassium: 5.1 mmol/L (ref 3.5–5.1)
Potassium: 5.4 mmol/L — ABNORMAL HIGH (ref 3.5–5.1)
Sodium: 123 mmol/L — ABNORMAL LOW (ref 135–145)
Sodium: 123 mmol/L — ABNORMAL LOW (ref 135–145)
Total Bilirubin: 2.3 mg/dL — ABNORMAL HIGH (ref 0.3–1.2)
Total Bilirubin: 2.4 mg/dL — ABNORMAL HIGH (ref 0.3–1.2)
Total Protein: 6.6 g/dL (ref 6.5–8.1)
Total Protein: 7.5 g/dL (ref 6.5–8.1)

## 2019-03-03 LAB — BASIC METABOLIC PANEL
Anion gap: 24 — ABNORMAL HIGH (ref 5–15)
BUN: 45 mg/dL — ABNORMAL HIGH (ref 8–23)
CO2: 8 mmol/L — ABNORMAL LOW (ref 22–32)
Calcium: 8.2 mg/dL — ABNORMAL LOW (ref 8.9–10.3)
Chloride: 92 mmol/L — ABNORMAL LOW (ref 98–111)
Creatinine, Ser: 1.79 mg/dL — ABNORMAL HIGH (ref 0.61–1.24)
GFR calc Af Amer: 46 mL/min — ABNORMAL LOW (ref >60)
GFR calc non Af Amer: 39 mL/min — ABNORMAL LOW (ref >60)
Glucose, Bld: 682 mg/dL (ref 70–99)
Potassium: 4.9 mmol/L (ref 3.5–5.1)
Sodium: 124 mmol/L — ABNORMAL LOW (ref 135–145)

## 2019-03-03 LAB — CBC WITH DIFFERENTIAL/PLATELET
Abs Immature Granulocytes: 0.44 10*3/uL — ABNORMAL HIGH (ref 0.00–0.07)
Basophils Absolute: 0.1 10*3/uL (ref 0.0–0.1)
Basophils Relative: 1 %
Eosinophils Absolute: 0 10*3/uL (ref 0.0–0.5)
Eosinophils Relative: 0 %
HCT: 42.9 % (ref 39.0–52.0)
Hemoglobin: 13.4 g/dL (ref 13.0–17.0)
Immature Granulocytes: 5 %
Lymphocytes Relative: 8 %
Lymphs Abs: 0.8 10*3/uL (ref 0.7–4.0)
MCH: 27.3 pg (ref 26.0–34.0)
MCHC: 31.2 g/dL (ref 30.0–36.0)
MCV: 87.4 fL (ref 80.0–100.0)
Monocytes Absolute: 0.7 10*3/uL (ref 0.1–1.0)
Monocytes Relative: 8 %
Neutro Abs: 7.3 10*3/uL (ref 1.7–7.7)
Neutrophils Relative %: 78 %
Platelets: 232 10*3/uL (ref 150–400)
RBC: 4.91 MIL/uL (ref 4.22–5.81)
RDW: 16.8 % — ABNORMAL HIGH (ref 11.5–15.5)
WBC: 9.4 10*3/uL (ref 4.0–10.5)
nRBC: 0 % (ref 0.0–0.2)

## 2019-03-03 LAB — MAGNESIUM: Magnesium: 2.3 mg/dL (ref 1.7–2.4)

## 2019-03-03 LAB — GLUCOSE, CAPILLARY
Glucose-Capillary: 455 mg/dL — ABNORMAL HIGH (ref 70–99)
Glucose-Capillary: 477 mg/dL — ABNORMAL HIGH (ref 70–99)
Glucose-Capillary: 502 mg/dL (ref 70–99)
Glucose-Capillary: 518 mg/dL (ref 70–99)
Glucose-Capillary: 572 mg/dL (ref 70–99)
Glucose-Capillary: >600 mg/dL (ref 70–99)
Glucose-Capillary: >600 mg/dL (ref 70–99)
Glucose-Capillary: >600 mg/dL (ref 70–99)
Glucose-Capillary: >600 mg/dL (ref 70–99)
Glucose-Capillary: >600 mg/dL (ref 70–99)
Glucose-Capillary: >600 mg/dL (ref 70–99)
Glucose-Capillary: >600 mg/dL (ref 70–99)

## 2019-03-03 LAB — TROPONIN I (HIGH SENSITIVITY)
Troponin I (High Sensitivity): 13356 ng/L (ref <18)
Troponin I (High Sensitivity): >27000 ng/L (ref <18)

## 2019-03-03 LAB — BLOOD GAS, VENOUS
Acid-base deficit: 28.3 mmol/L — ABNORMAL HIGH (ref 0.0–2.0)
Bicarbonate: 5.1 mmol/L — ABNORMAL LOW (ref 20.0–28.0)
O2 Saturation: 49.5 %
Patient temperature: 37
pCO2, Ven: 30 mmHg — ABNORMAL LOW (ref 44.0–60.0)
pH, Ven: <6.9 — CL (ref 7.250–7.430)
pO2, Ven: 51 mmHg — ABNORMAL HIGH (ref 32.0–45.0)

## 2019-03-03 LAB — PROTIME-INR
INR: 1.2 (ref 0.8–1.2)
Prothrombin Time: 14.6 seconds (ref 11.4–15.2)

## 2019-03-03 LAB — RESPIRATORY PANEL BY RT PCR (FLU A&B, COVID)
Influenza A by PCR: NEGATIVE
Influenza B by PCR: NEGATIVE
SARS Coronavirus 2 by RT PCR: NEGATIVE

## 2019-03-03 LAB — POC SARS CORONAVIRUS 2 AG: SARS Coronavirus 2 Ag: NEGATIVE

## 2019-03-03 LAB — BETA-HYDROXYBUTYRIC ACID
Beta-Hydroxybutyric Acid: >8 mmol/L — ABNORMAL HIGH (ref 0.05–0.27)
Beta-Hydroxybutyric Acid: >8 mmol/L — ABNORMAL HIGH (ref 0.05–0.27)

## 2019-03-03 LAB — BRAIN NATRIURETIC PEPTIDE: B Natriuretic Peptide: 330 pg/mL — ABNORMAL HIGH (ref 0.0–100.0)

## 2019-03-03 LAB — APTT: aPTT: 85 seconds — ABNORMAL HIGH (ref 24–36)

## 2019-03-03 LAB — MRSA PCR SCREENING: MRSA by PCR: NEGATIVE

## 2019-03-03 MED ORDER — LACTATED RINGERS IV SOLN: INTRAVENOUS | Status: DC

## 2019-03-03 MED ORDER — SODIUM CHLORIDE 0.9 % IV SOLN: INTRAVENOUS | Status: DC

## 2019-03-03 MED ORDER — HEPARIN (PORCINE) 25000 UT/250ML-% IV SOLN: 1150.0000 [IU]/h | INTRAVENOUS | Status: DC

## 2019-03-03 MED ORDER — DEXTROSE 50 % IV SOLN: 0.0000 mL | INTRAVENOUS | Status: DC | PRN

## 2019-03-03 MED ORDER — ASPIRIN EC 325 MG PO TBEC
325.0000 mg | DELAYED_RELEASE_TABLET | Freq: Every day | ORAL | Status: DC
  Administered 2019-03-04 – 2019-03-06 (×2): 325 mg via ORAL
  Filled 2019-03-03 (×2): qty 1

## 2019-03-03 MED ORDER — SODIUM CHLORIDE 0.9 % IV BOLUS
1000.0000 mL | Freq: Once | INTRAVENOUS | Status: AC
  Administered 2019-03-03: 1000 mL via INTRAVENOUS

## 2019-03-03 MED ORDER — INSULIN REGULAR(HUMAN) IN NACL 100-0.9 UT/100ML-% IV SOLN: INTRAVENOUS | Status: DC

## 2019-03-03 MED ORDER — INSULIN REGULAR(HUMAN) IN NACL 100-0.9 UT/100ML-% IV SOLN
INTRAVENOUS | Status: DC
  Administered 2019-03-03: 13 [IU]/h via INTRAVENOUS
  Administered 2019-03-04: 01:00:00 12 [IU]/h via INTRAVENOUS
  Filled 2019-03-03 (×2): qty 100

## 2019-03-03 MED ORDER — LORAZEPAM 2 MG/ML IJ SOLN: 0.0000 mg | Freq: Two times a day (BID) | INTRAMUSCULAR | Status: AC

## 2019-03-03 MED ORDER — HEPARIN BOLUS VIA INFUSION
4100.0000 [IU] | Freq: Once | INTRAVENOUS | Status: DC
  Filled 2019-03-03: qty 4100

## 2019-03-03 MED ORDER — DIGOXIN 0.25 MG/ML IJ SOLN
0.5000 mg | Freq: Once | INTRAMUSCULAR | Status: AC
  Administered 2019-03-03: 0.5 mg via INTRAVENOUS
  Filled 2019-03-03: qty 2

## 2019-03-03 MED ORDER — CHLORHEXIDINE GLUCONATE 0.12 % MT SOLN
15.0000 mL | Freq: Two times a day (BID) | OROMUCOSAL | Status: DC
  Administered 2019-03-03 – 2019-03-19 (×30): 15 mL via OROMUCOSAL
  Filled 2019-03-03 (×31): qty 15

## 2019-03-03 MED ORDER — THIAMINE HCL 100 MG/ML IJ SOLN: 100.0000 mg | Freq: Every day | INTRAMUSCULAR | Status: DC

## 2019-03-03 MED ORDER — ALBUTEROL SULFATE (2.5 MG/3ML) 0.083% IN NEBU
3.0000 mL | INHALATION_SOLUTION | RESPIRATORY_TRACT | Status: DC | PRN
  Administered 2019-03-14: 3 mL via RESPIRATORY_TRACT
  Filled 2019-03-03: qty 3

## 2019-03-03 MED ORDER — ORAL CARE MOUTH RINSE
15.0000 mL | Freq: Two times a day (BID) | OROMUCOSAL | Status: DC
  Administered 2019-03-04 – 2019-03-17 (×15): 15 mL via OROMUCOSAL

## 2019-03-03 MED ORDER — HEPARIN (PORCINE) 25000 UT/250ML-% IV SOLN
2600.0000 [IU]/h | INTRAVENOUS | Status: DC
  Administered 2019-03-03: 1150 [IU]/h via INTRAVENOUS
  Administered 2019-03-04: 1300 [IU]/h via INTRAVENOUS
  Administered 2019-03-05: 1700 [IU]/h via INTRAVENOUS
  Administered 2019-03-05: 1550 [IU]/h via INTRAVENOUS
  Administered 2019-03-06 (×2): 1950 [IU]/h via INTRAVENOUS
  Administered 2019-03-07: 2200 [IU]/h via INTRAVENOUS
  Administered 2019-03-07: 2400 [IU]/h via INTRAVENOUS
  Administered 2019-03-08: 2600 [IU]/h via INTRAVENOUS
  Filled 2019-03-03 (×10): qty 250

## 2019-03-03 MED ORDER — HEPARIN BOLUS VIA INFUSION
4100.0000 [IU] | Freq: Once | INTRAVENOUS | Status: AC
  Administered 2019-03-03: 4100 [IU] via INTRAVENOUS
  Filled 2019-03-03: qty 4100

## 2019-03-03 MED ORDER — DEXTROSE 50 % IV SOLN
0.0000 mL | INTRAVENOUS | Status: DC | PRN
  Administered 2019-03-08: 25 mL via INTRAVENOUS
  Filled 2019-03-03: qty 50

## 2019-03-03 MED ORDER — SODIUM CHLORIDE 0.9 % IV BOLUS: 2000.0000 mL | Freq: Once | INTRAVENOUS | Status: DC

## 2019-03-03 MED ORDER — DM-GUAIFENESIN ER 30-600 MG PO TB12
1.0000 | ORAL_TABLET | Freq: Two times a day (BID) | ORAL | Status: DC
  Administered 2019-03-04 – 2019-03-19 (×25): 1 via ORAL
  Filled 2019-03-03 (×25): qty 1

## 2019-03-03 MED ORDER — ADULT MULTIVITAMIN W/MINERALS CH
1.0000 | ORAL_TABLET | Freq: Every day | ORAL | Status: DC
  Administered 2019-03-04 – 2019-03-19 (×15): 1 via ORAL
  Filled 2019-03-03 (×15): qty 1

## 2019-03-03 MED ORDER — IPRATROPIUM BROMIDE 0.02 % IN SOLN
2.5000 mL | RESPIRATORY_TRACT | Status: DC
  Administered 2019-03-03 – 2019-03-04 (×4): 0.5 mg via RESPIRATORY_TRACT
  Filled 2019-03-03 (×4): qty 2.5

## 2019-03-03 MED ORDER — FOLIC ACID 1 MG PO TABS
1.0000 mg | ORAL_TABLET | Freq: Every day | ORAL | Status: DC
  Administered 2019-03-04: 1 mg via ORAL
  Filled 2019-03-03: qty 1

## 2019-03-03 MED ORDER — THIAMINE HCL 100 MG PO TABS
100.0000 mg | ORAL_TABLET | Freq: Every day | ORAL | Status: DC
  Administered 2019-03-04: 100 mg via ORAL
  Filled 2019-03-03: qty 1

## 2019-03-03 MED ORDER — DEXTROSE-NACL 5-0.45 % IV SOLN: INTRAVENOUS | Status: DC

## 2019-03-03 MED ORDER — LORAZEPAM 1 MG PO TABS
1.0000 mg | ORAL_TABLET | ORAL | Status: AC | PRN
  Administered 2019-03-04: 2 mg via ORAL
  Filled 2019-03-03: qty 2

## 2019-03-03 MED ORDER — DILTIAZEM HCL-DEXTROSE 125-5 MG/125ML-% IV SOLN (PREMIX)
5.0000 mg/h | INTRAVENOUS | Status: DC
  Administered 2019-03-03: 5 mg/h via INTRAVENOUS
  Filled 2019-03-03: qty 125

## 2019-03-03 MED ORDER — SODIUM BICARBONATE 8.4 % IV SOLN
50.0000 meq | Freq: Once | INTRAVENOUS | Status: AC
  Administered 2019-03-03: 50 meq via INTRAVENOUS
  Filled 2019-03-03: qty 50

## 2019-03-03 MED ORDER — NICOTINE 21 MG/24HR TD PT24
21.0000 mg | MEDICATED_PATCH | Freq: Every day | TRANSDERMAL | Status: DC
  Administered 2019-03-04 – 2019-03-19 (×15): 21 mg via TRANSDERMAL
  Filled 2019-03-03 (×18): qty 1

## 2019-03-03 MED ORDER — ACETAMINOPHEN 325 MG PO TABS: 650.0000 mg | ORAL_TABLET | Freq: Four times a day (QID) | ORAL | Status: DC | PRN

## 2019-03-03 MED ORDER — LORAZEPAM 2 MG/ML IJ SOLN
0.0000 mg | Freq: Four times a day (QID) | INTRAMUSCULAR | Status: AC
  Administered 2019-03-04: 4 mg via INTRAVENOUS
  Administered 2019-03-05: 2 mg via INTRAVENOUS
  Filled 2019-03-03: qty 1
  Filled 2019-03-03: qty 2
  Filled 2019-03-03: qty 1

## 2019-03-03 MED ORDER — DILTIAZEM HCL-DEXTROSE 125-5 MG/125ML-% IV SOLN (PREMIX): 5.0000 mg/h | INTRAVENOUS | Status: DC

## 2019-03-03 MED ORDER — ONDANSETRON HCL 4 MG/2ML IJ SOLN: 4.0000 mg | Freq: Three times a day (TID) | INTRAMUSCULAR | Status: DC | PRN

## 2019-03-03 MED ORDER — DEXTROSE IN LACTATED RINGERS 5 % IV SOLN
INTRAVENOUS | Status: DC
  Administered 2019-03-05: 75 mL/h via INTRAVENOUS

## 2019-03-03 MED ORDER — LORAZEPAM 2 MG/ML IJ SOLN
1.0000 mg | INTRAMUSCULAR | Status: AC | PRN
  Administered 2019-03-03 – 2019-03-04 (×2): 2 mg via INTRAVENOUS
  Filled 2019-03-03: qty 1

## 2019-03-03 NOTE — Consult Note (Signed)
Name: Craig Koch MRN: [id number] DOB: 10/10/1955    ADMISSION DATE:  03/03/2019 CONSULTATION DATE:  03/03/2019  REFERRING MD :  Sharion Settler, NP  CHIEF COMPLAINT: Shortness of breath and generalized weakness  BRIEF PATIENT DESCRIPTION:  64 year old male with past medical history notable for lung adenocarcinoma stage IV, tobacco abuse, alcohol abuse, malignant left pleural effusion admitted on 2/28 with acute hypoxic respiratory failure, severe DKA, NSTEMI, A. fib with RVR, and AKI requiring Insulin drip, heparin drip, Cardizem drip.  Cardiology is following.  SIGNIFICANT EVENTS  2/28-admission to stepdown by hospitalist; cardiology consulted 2/28-PCCM consulted due to hypotension 2/28-heart rate controlled and hypotension resolved following IV digoxin x2  STUDIES:  Chest x-ray 2/28>>Cardiomediastinal contours are stable following median sternotomy with persistent left-sided pleural effusion and pleural-parenchymal scarring. No new areas of consolidation or evidence of right-sided pleural effusion.  CULTURES: SARS-CoV-2 PCR 2/28>> negative Influenza PCR 2/28>> negative MRSA PCR 2/28>> negative  ANTIBIOTICS: N/A  HISTORY OF PRESENT ILLNESS:   Craig Koch is a 64 y.o. male with medical history significant of hypertension, hyperlipidemia, diabetes mellitus, depression, anxiety, lung cancer, CAD, CABG, tobacco abuse, alcohol abuse, left pleural effusion, who presents to [facility] ED on 03/03/19 with shortness of breath and generalized weakness.  He reports having shortness of breath, dry cough, and generalized weakness for the past several days, which worsened today.  He denied chest pain, fever, chills, nausea, vomiting, diarrhea, abdominal pain, dysuria.  Upon presentation to the ED his temperature was 97.2, blood pressure 106/72, atrial fibrillation with RVR on EKG, tachypneic. Initial work-up in the ED revealed blood glucose 718, bicarbonate less than 7, anion gap not  calculated, beta hydroxybutyric acid greater than 8, BNP 330, creatinine 1.6, BUN 37.  VBG with pH less than 6.9, CO2 30, O2 51.  Chest x-ray with persistent left-sided pleural effusion.  He was admitted to the stepdown unit by the hospitalist for further work-up and treatment of severe DKA, NSTEMI,  atrial fibrillation with RVR, and AKI.  He was placed on Insulin, Heparin, and Cardizem drips.  Cardiology is following along.    While in [place] he is noted to be hypotensive while in A-fib w/ RVR.[name]  PCCM was consulted by Hospitalist for further assistance in managing hypotension due to Cardiogenic shock.  Cardiology gave him digoxin 0.25 mg IV x2 with improvement in HR and resolution of hypotension  PAST MEDICAL HISTORY :   has a past medical history of Anxiety, Arthritis, Coronary artery disease, Diabetes mellitus, Dyslipidemia, CABG, Hyperlipidemia, Hypertension, Malignant neoplasm of unspecified part of unspecified bronchus or lung ([place]) (08/2018), and Pleural effusion.  has a past surgical history that includes Right lower leg surgery; Left ankle surgery; Cataract extraction (Left, 09/2015); Coronary artery bypass graft (2004); and Chest tube insertion (Left, 10/01/2018). Prior to Admission medications   Medication Sig Start Date End Date Taking? Authorizing Provider  dabrafenib mesylate (TAFINLAR) 75 MG capsule Take 2 capsules (150 mg total) by mouth 2 (two) times daily. Take on an empty stomach 1 hour before or 2 hours after meals. 11/14/18  Yes Earlie Server, MD  dronabinol (MARINOL) 5 MG capsule Take 1 capsule (5 mg total) by mouth 2 (two) times daily before a meal. 11/09/18  Yes Earlie Server, MD  DULoxetine (CYMBALTA) 30 MG capsule TAKE 1 CAPSULE BY MOUTH EVERY DAY Patient taking differently: Take 30 mg by mouth daily.  02/06/19  Yes Tonia Ghent, MD  insulin detemir (LEVEMIR) 100 UNIT/ML injection Inject 0.4-0.6 mLs (40-60 Units  total) into the skin at bedtime. Start at 40 units at night.  Add 1 unit  per day until AM sugar is ~150 12/17/18  Yes Tonia Ghent, MD  insulin lispro (HUMALOG) 100 UNIT/ML injection Inject 0.2 mLs (20 Units total) into the skin 3 (three) times daily with meals. 12/17/18  Yes Tonia Ghent, MD  metFORMIN (GLUCOPHAGE) 500 MG tablet TAKE 1 TABLET (500 MG TOTAL) BY MOUTH 2 (TWO) TIMES DAILY WITH A MEAL. 02/14/19  Yes Tonia Ghent, MD  metoprolol tartrate (LOPRESSOR) 100 MG tablet Take 0.5 tablets (50 mg total) by mouth daily. 12/17/18  Yes Tonia Ghent, MD  ondansetron (ZOFRAN) 8 MG tablet Take 1 tablet (8 mg total) by mouth every 8 (eight) hours as needed for nausea or vomiting. 11/16/18  Yes Borders, Kirt Boys, NP  rosuvastatin (CRESTOR) 10 MG tablet TAKE 1 TABLET (10 MG TOTAL) BY MOUTH DAILY. 01/07/19  Yes Tonia Ghent, MD  trametinib dimethyl sulfoxide (MEKINIST) 2 MG tablet Take 1 tablet (2 mg total) by mouth daily. Take 1 hour before or 2 hours after a meal. Store refrigerated in original container. 11/14/18  Yes Earlie Server, MD  albuterol (VENTOLIN HFA) 108 (90 Base) MCG/ACT inhaler Inhale 2 puffs into the lungs every 6 (six) hours as needed for wheezing or shortness of breath. 08/24/18   Loletha Grayer, MD  BD INSULIN SYRINGE U/F 31G X 5/16" 0.5 ML MISC USE DAILY AS DIRECTED. DX E11.9 02/20/19   Tonia Ghent, MD  glucose blood (ONE TOUCH ULTRA TEST) test strip USE TO TEST GLUCOSE LEVELS 4 (FOUR) TIMES DAILY. 02/14/19   Tonia Ghent, MD  triamcinolone cream (KENALOG) 0.1 % Apply 1 application topically 2 (two) times daily. 01/12/19   Earlie Server, MD   Allergies  Allergen Reactions  . Lipitor [Atorvastatin Calcium] Other (See Comments)    Aches.  Tolerated crestor.     FAMILY HISTORY:  family history includes Dementia in his mother; Heart disease in his father. SOCIAL HISTORY:  reports that he has been smoking cigarettes. He has a 9.00 pack-year smoking history. He has quit using smokeless tobacco.  His smokeless tobacco use included snuff. He  reports current alcohol use of about 6.0 standard drinks of alcohol per week. He reports that he does not use drugs.   COVID-19 DISASTER DECLARATION:  FULL CONTACT PHYSICAL EXAMINATION WAS NOT POSSIBLE DUE TO TREATMENT OF COVID-19 AND  CONSERVATION OF PERSONAL PROTECTIVE EQUIPMENT, LIMITED EXAM FINDINGS INCLUDE-  Patient assessed or the symptoms described in the history of present illness.  In the context of the Global COVID-19 pandemic, which necessitated consideration that the patient might be at risk for infection with the SARS-CoV-2 virus that causes COVID-19, Institutional protocols and algorithms that pertain to the evaluation of patients at risk for COVID-19 are in a state of rapid change based on information released by regulatory bodies including the CDC and federal and state organizations. These policies and algorithms were followed during the patient's care while in hospital.  REVIEW OF SYSTEMS:   Constitutional: Negative for fever, chills, weight loss, +malaise/fatigue and diaphoresis.  HENT: Negative for hearing loss, ear pain, nosebleeds, congestion, sore throat, neck pain, tinnitus and ear discharge.   Eyes: Negative for blurred vision, double vision, photophobia, pain, discharge and redness.  Respiratory: Negative for cough, hemoptysis, sputum production, +shortness of breath, wheezing and stridor.   Cardiovascular: Negative for chest pain, palpitations, orthopnea, claudication, leg swelling and PND.  Gastrointestinal: Negative for heartburn, nausea, vomiting,  abdominal pain, diarrhea, constipation, blood in stool and melena.  Genitourinary: Negative for dysuria, urgency, frequency, hematuria and flank pain.  Musculoskeletal: Negative for myalgias, back pain, joint pain and falls.  Skin: Negative for itching and rash.  Neurological: Negative for dizziness, tingling, tremors, sensory change, speech change, focal weakness, seizures, loss of consciousness, weakness and headaches.    Endo/Heme/Allergies: Negative for environmental allergies and polydipsia. Does not bruise/bleed easily.  SUBJECTIVE:  Patient reports shortness of breath, which has improved since admission, along with generalized malaise and fatigue Denies chest pain, abdominal pain, nausea vomiting, fever chills  VITAL SIGNS: Temp:  [97.2 F (36.2 C)-98.3 F (36.8 C)] 98.3 F (36.8 C) (02/28 2000) Pulse Rate:  [101-131] 101 (02/28 2200) Resp:  [25-30] 27 (02/28 2200) BP: (87-182)/(46-137) 105/56 (02/28 2200) SpO2:  [98 %-100 %] 100 % (02/28 2200) FiO2 (%):  [28 %] 28 % (02/28 2200) Weight:  [83.9 kg-86.2 kg] 83.9 kg (02/28 2000)  PHYSICAL EXAMINATION: General: Acutely ill-appearing male, sitting in bed, sleeping breathing treatment, with mild respiratory distress Neuro: Awake, alert and oriented x2, follows commands, no focal deficits, speech clear HEENT: Atraumatic, normocephalic, neck supple, no JVD Cardiovascular: Tachycardia, regular rhythm, 1+ pedal pulses bilaterally Lungs: Coarse breath sounds bilaterally, mild respiratory distress with tachypnea Abdomen: Soft, nontender, nondistended, no guarding or rebound tenderness, bowel sounds positive S4 Musculoskeletal: No deformities, no edema Skin: Warm and dry.  No obvious rashes, lesions, or ulcerations  Recent Labs  Lab 03/03/19 1547 03/03/19 1750 03/03/19 1936  NA 123* 123* 124*  K 5.1 5.4* 4.9  CL 89* 89* 92*  CO2 <7* 8* 8*  BUN 37* 43* 45*  CREATININE 1.60* 1.79* 1.79*  GLUCOSE 718* 756* 682*   Recent Labs  Lab 03/03/19 1330  HGB 13.4  HCT 42.9  WBC 9.4  PLT 232   DG Chest Portable 1 View  Result Date: 03/03/2019 CLINICAL DATA:  Shortness of breath, since Monday worsening this morning around 1 a.m. EXAM: PORTABLE CHEST 1 VIEW COMPARISON:  12/19/2018 FINDINGS: Cardiomediastinal contours are stable following median sternotomy with persistent left-sided pleural effusion and pleural-parenchymal scarring. No new areas of  consolidation or evidence of right-sided pleural effusion. No signs of acute bone process. IMPRESSION: Persistent left-sided pleural effusion and pleural-parenchymal scarring. Electronically Signed   By: Zetta Bills M.D.   On: 03/03/2019 13:59    ASSESSMENT / PLAN:  Acute hypoxic respiratory failure in the setting of severe metabolic acidosis, atrial fibrillation with RVR, and persistent left pleural effusion (malignant) Hx: Lung Adenocarcinoma stage IV, pleural effusion, smoker -Supplemental O2 as needed to maintain O2 sats greater than 92% -We will place on BiPAP, wean as tolerated -Patient confirms he is a DO NOT INTUBATE -Follow intermittent ABG and chest x-ray as needed -Correct acidosis and rapid heart rate -Consider thoracentesis -Followed by Oncology, consider consult while in Hosptial -Encourage smoking cessation  Severe DKA -Follow DKA protocol -IV fluids -Insulin drip -BMP every 4 hours -Once anion gap closed and serum bicarb greater than 20, can transition to sliding scale insulin and long-acting insulin -Check hemoglobin A1c -Consult diabetes coordinator, appreciate input  NSTEMI Atrial fibrillation with RVR Cardiogenic shock>> resolved Hx: HTN, HLD, CAD s/p CABG -Continuous cardiac monitoring -Maintain MAP greater than 65 -IV fluids -Vasopressors if needed to maintain map goal -Cardiology following, appreciate input -Continue heparin and Cardizem drips as per cardiology -Received digoxin with improvement in heart rate and blood pressure -If rate becomes uncontrolled again, consider amiodarone -2D echocardiogram pending  AKI -Monitor  I&O's / urinary output -Follow BMP -Ensure adequate renal perfusion -Avoid nephrotoxic agents as able -Replace electrolytes as indicated -IV fluids  History of alcohol abuse -CIWA protocol        Disposition: ICU Goals of care: Limited code (no intubation)-confirmed with patient VTE prophylaxis/anticoagulation:  Heparin drip Updates: Updated patient at bedside 03/03/2019  Darel Hong, [facility] [place] Pager: [phone]  03/03/2019, 11:07 PM

## 2019-03-03 NOTE — Consult Note (Signed)
[place] for Heparin  Indication: atrial fibrillation- new onset  Allergies  Allergen Reactions  . Lipitor [Atorvastatin Calcium] Other (See Comments)    Aches.  Tolerated crestor.     Patient Measurements: Height: 6' (182.9 cm) Weight: 190 lb (86.2 kg) IBW/kg (Calculated) : 77.6 Heparin Dosing Weight: 86.2 kg   Vital Signs: Temp: 97.2 F (36.2 C) (02/28 1332) Temp Source: Axillary (02/28 1332) BP: 106/72 (02/28 1400) Pulse Rate: 131 (02/28 1332)  Labs: Recent Labs    03/03/19 1330 03/03/19 1547  HGB 13.4  --   HCT 42.9  --   PLT 232  --   CREATININE  --  1.60*  TROPONINIHS  --  13,356*    Estimated Creatinine Clearance: 51.9 mL/min (A) (by C-G formula based on SCr of 1.6 mg/dL (H)).   Medications:  Called RN to confirm whether pt has been on [facility] prior to admission. Nurse was not able to confirm as she was not in the patient's room. Per chart review, no AC prior to admission and confirmed with pharmacy technician who spoke to patient.   Assessment: Craig Koch is a 64 y.o. male with a past medical history of anxiety, CAD, diabetes, and hyperlipidemia, hypertension, lung cancer, presents to the emergency department for shortness of breath. Pharmacy was consulted for heparin dosing in a patient with new onset atrial fibrillation.   Troponin 13356  Goal of Therapy:  Heparin level 0.3-0.7 units/ml Monitor platelets by anticoagulation protocol: Yes   Plan:  Baseline labs have been ordered  Heparin DW: 86.2 kg  Give 4100 units bolus x 1 Start heparin infusion at 1150 units/hr Check anti-Xa level in 6 hours and daily while on heparin, per protocol Continue to monitor H&H and platelets   Emmilia Sowder R Sarabi Sockwell 03/03/2019,4:51 PM

## 2019-03-03 NOTE — ED Triage Notes (Addendum)
Pt via EMS from home. Pt has been SOB since Monday but it got worse this AM around 0100. On arrival, pt is SOB, labored, and accessory muscles in use. Pt has a hx of diabetes and lung cancer. EMS states O2 levels were in the low 90s and pt was placed on a NRB. Per EMS, pt CBG read HIGH on the glucometer. Denies pain at this time. Pt is A&Ox4 upon arrival and switched to 2L [place] O2, pt currently sat 100%.

## 2019-03-03 NOTE — Consult Note (Signed)
Cardiology Consultation:   Patient ID: Craig Koch MRN: [id number]; DOB: 1955/04/27  Admit date: 03/03/2019 Date of Consult: 03/03/2019  Primary Care Provider: Tonia Ghent, MD Primary Cardiologist: Arvid Right Physician requesting consult: Dr. Ivor Costa Reason for consult: Non-STEMI, atrial fibrillation   Patient Profile:   Craig Koch is a 64 y.o. male with a hx of  CAD,  CABG with LIMA to the LAD, SVG to OM2/OM3, SVGto diag Diabetes, poorly controlled , 9.0 Smoker HTN Depression Hyperlipidemia Presenting with shortness of breath  Diabetes,  lung cancer, adenocarcinoma, stage IV followed by oncology Alcohol abuse Malignant pleural effusion Presenting with worsening shortness of breath/respiratory distress, new onset atrial fibrillation with RVR   History of Present Illness:   [name] reports having worsening Shortness of breath for the past week, worse this morning, presenting to the hospital  Mild hypoxia on arrival,sats in 80s, improved with nasal cannula oxygen to the 90s  Heart rate 130 bpm on arrival, atrial fibrillation confirmed on EKG  Covid negative  In the emergency room started on Cardizem infusion, heparin Echocardiogram ordered  uncontrolled diabetes, most recent A1c 10.6 On insulin at home Blood glucose greater than 700 on arrival, unable to exclude DKA  Also noted to have acute kidney injury in the emergency room, started on IV fluids Creatinine 1.79, BUN 43, well above his baseline  Sodium 124  Initial troponin 330 Repeat troponin greater than 27,000 On heparin infusion  Records reviewed, hospitalized August 2020 for large pleural effusion, near complete collapse of left lower lobe, extensive volume loss, Underwent thoracentesis with 1.8 L removed Has had follow-up with pulmonary, left thoracentesis repeated with 3 L removed Cytology positive for adenocarcinoma Being treated with dabrafenib and trametinib  Heart  Pathway Score:     Past Medical History:  Diagnosis Date  . Anxiety   . Arthritis   . Coronary artery disease   . Diabetes mellitus   . Dyslipidemia   . Hx of CABG   . Hyperlipidemia   . Hypertension   . Malignant neoplasm of unspecified part of unspecified bronchus or lung ([place]) 08/2018   Immunotherapy  . Pleural effusion     Past Surgical History:  Procedure Laterality Date  . CATARACT EXTRACTION Left 09/2015  . CHEST TUBE INSERTION Left 10/01/2018   Procedure: INSERTION PLEURAL DRAINAGE CATHETER;  Surgeon: [name], MD;  Location: [facility] ORS;  Service: Thoracic;  Laterality: Left;  . CORONARY ARTERY BYPASS GRAFT  2004   (CABG with LIMA to the  LAD, SVG to OM2/OM3, SVG  to diag  . Left ankle surgery     repair of fracture  . Right lower leg surgery     rod     Home Medications:  Prior to Admission medications   Medication Sig Start Date End Date Taking? Authorizing Provider  dabrafenib mesylate (TAFINLAR) 75 MG capsule Take 2 capsules (150 mg total) by mouth 2 (two) times daily. Take on an empty stomach 1 hour before or 2 hours after meals. 11/14/18  Yes Earlie Server, MD  dronabinol (MARINOL) 5 MG capsule Take 1 capsule (5 mg total) by mouth 2 (two) times daily before a meal. 11/09/18  Yes Earlie Server, MD  DULoxetine (CYMBALTA) 30 MG capsule TAKE 1 CAPSULE BY MOUTH EVERY DAY Patient taking differently: Take 30 mg by mouth daily.  02/06/19  Yes Tonia Ghent, MD  insulin detemir (LEVEMIR) 100 UNIT/ML injection Inject 0.4-0.6 mLs (40-60 Units total) into the skin at bedtime. Start  at 40 units at night.  Add 1 unit per day until AM sugar is ~150 12/17/18  Yes Tonia Ghent, MD  insulin lispro (HUMALOG) 100 UNIT/ML injection Inject 0.2 mLs (20 Units total) into the skin 3 (three) times daily with meals. 12/17/18  Yes Tonia Ghent, MD  metFORMIN (GLUCOPHAGE) 500 MG tablet TAKE 1 TABLET (500 MG TOTAL) BY MOUTH 2 (TWO) TIMES DAILY WITH A MEAL. 02/14/19  Yes Tonia Ghent, MD    metoprolol tartrate (LOPRESSOR) 100 MG tablet Take 0.5 tablets (50 mg total) by mouth daily. 12/17/18  Yes Tonia Ghent, MD  ondansetron (ZOFRAN) 8 MG tablet Take 1 tablet (8 mg total) by mouth every 8 (eight) hours as needed for nausea or vomiting. 11/16/18  Yes Borders, Kirt Boys, NP  rosuvastatin (CRESTOR) 10 MG tablet TAKE 1 TABLET (10 MG TOTAL) BY MOUTH DAILY. 01/07/19  Yes Tonia Ghent, MD  trametinib dimethyl sulfoxide (MEKINIST) 2 MG tablet Take 1 tablet (2 mg total) by mouth daily. Take 1 hour before or 2 hours after a meal. Store refrigerated in original container. 11/14/18  Yes Earlie Server, MD  albuterol (VENTOLIN HFA) 108 (90 Base) MCG/ACT inhaler Inhale 2 puffs into the lungs every 6 (six) hours as needed for wheezing or shortness of breath. 08/24/18   Loletha Grayer, MD  BD INSULIN SYRINGE U/F 31G X 5/16" 0.5 ML MISC USE DAILY AS DIRECTED. DX E11.9 02/20/19   Tonia Ghent, MD  glucose blood (ONE TOUCH ULTRA TEST) test strip USE TO TEST GLUCOSE LEVELS 4 (FOUR) TIMES DAILY. 02/14/19   Tonia Ghent, MD  triamcinolone cream (KENALOG) 0.1 % Apply 1 application topically 2 (two) times daily. 01/12/19   Earlie Server, MD    Inpatient Medications: Scheduled Meds: . aspirin EC  325 mg Oral Daily  . dextromethorphan-guaiFENesin  1 tablet Oral BID  . folic acid  1 mg Oral Daily  . ipratropium  2.5 mL Inhalation Q4H  . LORazepam  0-4 mg Intravenous Q6H   Followed by  . [START ON 03/05/2019] LORazepam  0-4 mg Intravenous Q12H  . multivitamin with minerals  1 tablet Oral Daily  . nicotine  21 mg Transdermal Daily  . thiamine  100 mg Oral Daily   Or  . thiamine  100 mg Intravenous Daily   Continuous Infusions: . dextrose 5% lactated ringers    . diltiazem (CARDIZEM) infusion 5 mg/hr (03/03/19 1523)  . diltiazem (CARDIZEM) infusion 10 mg/hr (03/03/19 1815)  . heparin 1,150 Units/hr (03/03/19 1753)  . insulin 13 Units/hr (03/03/19 1819)  . lactated ringers     PRN  Meds: acetaminophen, albuterol, dextrose, LORazepam **OR** LORazepam, ondansetron (ZOFRAN) IV  Allergies:    Allergies  Allergen Reactions  . Lipitor [Atorvastatin Calcium] Other (See Comments)    Aches.  Tolerated crestor.     Social History:   Social History   Socioeconomic History  . Marital status: Married    Spouse name: Not on file  . Number of children: Not on file  . Years of education: Not on file  . Highest education level: Not on file  Occupational History  . Not on file  Tobacco Use  . Smoking status: Current Every Day Smoker    Packs/day: 0.20    Years: 45.00    Pack years: 9.00    Types: Cigarettes  . Smokeless tobacco: Former Systems developer    Types: Snuff  Substance and Sexual Activity  . Alcohol use: Yes  Alcohol/week: 6.0 standard drinks    Types: 6 Cans of beer per week    Comment: occ, average 6 pack in a week  . Drug use: No  . Sexual activity: Never  Other Topics Concern  . Not on file  Social History Narrative   On disability 2009 after prev injuries and CAD.     Married 1976   2 kids, 4 grandkids.    Social Determinants of Health   Financial Resource Strain:   . Difficulty of Paying Living Expenses: Not on file  Food Insecurity:   . Worried About Charity fundraiser in the Last Year: Not on file  . Ran Out of Food in the Last Year: Not on file  Transportation Needs:   . Lack of Transportation (Medical): Not on file  . Lack of Transportation (Non-Medical): Not on file  Physical Activity:   . Days of Exercise per Week: Not on file  . Minutes of Exercise per Session: Not on file  Stress:   . Feeling of Stress : Not on file  Social Connections:   . Frequency of Communication with Friends and Family: Not on file  . Frequency of Social Gatherings with Friends and Family: Not on file  . Attends Religious Services: Not on file  . Active Member of Clubs or Organizations: Not on file  . Attends Archivist Meetings: Not on file  . Marital  Status: Not on file  Intimate Partner Violence:   . Fear of Current or Ex-Partner: Not on file  . Emotionally Abused: Not on file  . Physically Abused: Not on file  . Sexually Abused: Not on file    Family History:    Family History  Problem Relation Age of Onset  . Dementia Mother   . Heart disease Father   . Colon cancer Neg Hx   . Prostate cancer Neg Hx   . Diabetes Neg Hx      ROS:  Please see the history of present illness.  Review of Systems  Constitutional: Positive for malaise/fatigue.  HENT: Negative.   Respiratory: Positive for shortness of breath.   Cardiovascular: Positive for palpitations.  Gastrointestinal: Negative.   Musculoskeletal: Negative.   Neurological: Negative.   Psychiatric/Behavioral: Negative.   All other systems reviewed and are negative.   Physical Exam/Data:   Vitals:   03/03/19 1645 03/03/19 1745 03/03/19 1800 03/03/19 1815  BP: 107/75 (!) 182/137 115/71 106/70  Pulse:   (!) 122   Resp: (!) 28     Temp:      TempSrc:      SpO2:   99%   Weight:      Height:        Intake/Output Summary (Last 24 hours) at 03/03/2019 1904 Last data filed at 03/03/2019 1542 Gross per 24 hour  Intake 1000 ml  Output --  Net 1000 ml   Last 3 Weights 03/03/2019 02/14/2019 02/08/2019  Weight (lbs) 190 lb 191 lb 191 lb 8 oz  Weight (kg) 86.183 kg 86.637 kg 86.864 kg     Body mass index is 25.77 kg/m.  General:  Thin, very ill appearing, respiratory distress HEENT: normal Lymph: no adenopathy Neck: no JVD Endocrine:  No thryomegaly Vascular: No carotid bruits; FA pulses 2+ bilaterally without bruits  Cardiac:rapid,  Irregularly irregular; no murmur  Lungs:  clear to auscultation bilaterally, no wheezing, rhonchi or rales  Abd: soft, nontender, no hepatomegaly  Ext: no edema Musculoskeletal:  No deformities, BUE and BLE  strength normal and equal Skin: warm and dry  Neuro:  Grossly nonfocal Psych:  Normal affect   EKG:  The EKG was personally  reviewed and demonstrates:   Shows atrial fibrillation ventricular rate 143 bpm borderline left bundle branch block, posterior fascicular block, diffuse ST-T wave abnormality anterolateral leads, inferior leads  Telemetry:  Telemetry was personally reviewed and demonstrates: Atrial fibrillation rate 130s  Relevant CV Studies: Echocardiogram pending  Laboratory Data:  High Sensitivity Troponin:   Recent Labs  Lab 03/03/19 1547 03/03/19 1750  TROPONINIHS 13,356* >27,000*     Chemistry Recent Labs  Lab 03/03/19 1547 03/03/19 1750  NA 123* 123*  K 5.1 5.4*  CL 89* 89*  CO2 <7* 8*  GLUCOSE 718* 756*  BUN 37* 43*  CREATININE 1.60* 1.79*  CALCIUM 8.2* 8.2*  GFRNONAA 45* 39*  GFRAA 52* 46*  ANIONGAP NOT CALCULATED 26*    Recent Labs  Lab 03/03/19 1547 03/03/19 1750  PROT 6.6 7.5  ALBUMIN 2.9* 3.2*  AST 81* 136*  ALT 18 25  ALKPHOS 80 104  BILITOT 2.3* 2.4*   Hematology Recent Labs  Lab 03/03/19 1330  WBC 9.4  RBC 4.91  HGB 13.4  HCT 42.9  MCV 87.4  MCH 27.3  MCHC 31.2  RDW 16.8*  PLT 232   BNP Recent Labs  Lab 03/03/19 1330  BNP 330.0*    DDimer No results for input(s): DDIMER in the last 168 hours.   Radiology/Studies:  DG Chest Portable 1 View  Result Date: 03/03/2019 CLINICAL DATA:  Shortness of breath, since Monday worsening this morning around 1 a.m. EXAM: PORTABLE CHEST 1 VIEW COMPARISON:  12/19/2018 FINDINGS: Cardiomediastinal contours are stable following median sternotomy with persistent left-sided pleural effusion and pleural-parenchymal scarring. No new areas of consolidation or evidence of right-sided pleural effusion. No signs of acute bone process. IMPRESSION: Persistent left-sided pleural effusion and pleural-parenchymal scarring. Electronically Signed   By: Zetta Bills M.D.   On: 03/03/2019 13:59   {   Assessment and Plan:   1. Atrial fibrillation with RVR Timing of onset unclear,at least one week Rapid rate on arrival, on  diltiazem infusion, rate still 130s -On heparin infusion Echocardiogram has been ordered, pending SBP in the 80 to 90 range -ordered digoxin 0.25 mg IV x 2, May need amiodarone IV if rate continues to run fast with hypotension  2.  Adenocarcinoma of lung stage IV Malignant pleural effusion on left, does not appear to need thoracentesis based on x-ray findings Prior thoracentesis x2 Followed by oncology  3. NSTEMI Markedly elevated troponin in the setting of atrial fibrillation with RVR, known CAD, CABG -Troponin greater than 27,000, on heparin infusion Will benefit from ischemic work-up this admission Echo pending Would work to stabilize respiratory status, atrial fib with RVR, and track renal function (CR 1.8) before cath -High risk of complications  4.  Poorly controlled diabetes with complications Hemoglobin A1c greater than 10 Medicine following Blood glucose 718 on arrival  5.  Smoker Long smoking history, was still smoking 1/2 pack/day 11 August 2018 Cessation recommended  6. Hyponatremia In setting of lung cancer, dehydration  Long discussion with nursing in ICU, intensivist covering overnight  Total encounter time more than 110 minutes  Greater than 50% was spent in counseling and coordination of care with the patient   For questions or updates, please contact [place] Please consult [email or web address] for contact info under     Signed, Ida Rogue, MD  03/03/2019 7:04 PM

## 2019-03-03 NOTE — ED Provider Notes (Addendum)
[facility] Emergency Department Provider Note  Time seen: 1:27 PM  I have reviewed the triage vital signs and the nursing notes.   HISTORY  Chief Complaint Shortness of Breath   HPI Craig Koch is a 64 y.o. male with a past medical history of anxiety, CAD, diabetes, and hyperlipidemia, hypertension, lung cancer, presents to the emergency department for shortness of breath.  According to the patient over the past 6 days he has been feeling short of breath and has progressively worsened.  Patient denies any significant cough.  No fever chest pain or abdominal pain.  EMS states patient found to be tachycardic around 130 bpm what appeared to be A. fib.  No known history of atrial fibrillation per patient.   Patient satting in the upper 80s to 90% on room air per EMS placed on nasal cannula oxygen.  Past Medical History:  Diagnosis Date  . Anxiety   . Arthritis   . Coronary artery disease   . Diabetes mellitus   . Dyslipidemia   . Hx of CABG   . Hyperlipidemia   . Hypertension   . Malignant neoplasm of unspecified part of unspecified bronchus or lung ([place]) 08/2018   Immunotherapy  . Pleural effusion     Patient Active Problem List   Diagnosis Date Noted  . Hyponatremia 02/08/2019  . Uncontrolled type 2 diabetes mellitus with hyperglycemia ([place]) 02/08/2019  . Leg swelling 02/08/2019  . Paronychia, finger 12/19/2018  . Encounter for antineoplastic chemotherapy 12/08/2018  . Tobacco abuse 10/12/2018  . Malignant pleural effusion   . Administrative encounter 09/23/2018  . lung adenocarcinoma 09/08/2018  . Goals of care, counseling/discussion 09/08/2018  . Recurrent pleural effusion on left 09/02/2018  . Elevated troponin 08/24/2018  . Medicare annual wellness visit, subsequent 08/07/2018  . Lower urinary tract symptoms (LUTS) 08/07/2018  . Neck pain 08/07/2018  . CAD (coronary artery disease), native coronary artery 09/23/2017  . Smoker 09/23/2017   . Chronic joint pain 10/18/2016  . Healthcare maintenance 07/17/2016  . Advance care planning 07/17/2016  . Fungal rash of torso 05/06/2016  . Trigger finger 05/06/2016  . History of alcohol use 11/17/2015  . Skin lesion 05/19/2015  . Anxiety state 02/19/2015  . Hand weakness 05/23/2011  . Diabetes mellitus with complication ([place]) 53/61/4431  . HLD (hyperlipidemia) 03/05/2010  . Essential hypertension 03/05/2010  . Coronary atherosclerosis 03/05/2010    Past Surgical History:  Procedure Laterality Date  . CATARACT EXTRACTION Left 09/2015  . CHEST TUBE INSERTION Left 10/01/2018   Procedure: INSERTION PLEURAL DRAINAGE CATHETER;  Surgeon: [name], MD;  Location: [facility] ORS;  Service: Thoracic;  Laterality: Left;  . CORONARY ARTERY BYPASS GRAFT  2004   (CABG with LIMA to the  LAD, SVG to OM2/OM3, SVG  to diag  . Left ankle surgery     repair of fracture  . Right lower leg surgery     rod    Prior to Admission medications   Medication Sig Start Date End Date Taking? Authorizing Provider  albuterol (VENTOLIN HFA) 108 (90 Base) MCG/ACT inhaler Inhale 2 puffs into the lungs every 6 (six) hours as needed for wheezing or shortness of breath. 08/24/18   Loletha Grayer, MD  BD INSULIN SYRINGE U/F 31G X 5/16" 0.5 ML MISC USE DAILY AS DIRECTED. DX E11.9 02/20/19   Tonia Ghent, MD  dabrafenib mesylate (TAFINLAR) 75 MG capsule Take 2 capsules (150 mg total) by mouth 2 (two) times daily. Take on an empty  stomach 1 hour before or 2 hours after meals. 11/14/18   Earlie Server, MD  dronabinol (MARINOL) 5 MG capsule Take 1 capsule (5 mg total) by mouth 2 (two) times daily before a meal. 11/09/18   Earlie Server, MD  DULoxetine (CYMBALTA) 30 MG capsule TAKE 1 CAPSULE BY MOUTH EVERY DAY 02/06/19   Tonia Ghent, MD  glucose blood (ONE TOUCH ULTRA TEST) test strip USE TO TEST GLUCOSE LEVELS 4 (FOUR) TIMES DAILY. 02/14/19   Tonia Ghent, MD  insulin detemir (LEVEMIR) 100 UNIT/ML injection Inject  0.4-0.6 mLs (40-60 Units total) into the skin at bedtime. Start at 40 units at night.  Add 1 unit per day until AM sugar is ~150 12/17/18   Tonia Ghent, MD  insulin lispro (HUMALOG) 100 UNIT/ML injection Inject 0.2 mLs (20 Units total) into the skin 3 (three) times daily with meals. 12/17/18   Tonia Ghent, MD  metFORMIN (GLUCOPHAGE) 500 MG tablet TAKE 1 TABLET (500 MG TOTAL) BY MOUTH 2 (TWO) TIMES DAILY WITH A MEAL. 02/14/19   Tonia Ghent, MD  metoprolol tartrate (LOPRESSOR) 100 MG tablet Take 0.5 tablets (50 mg total) by mouth daily. 12/17/18   Tonia Ghent, MD  ondansetron (ZOFRAN) 8 MG tablet Take 1 tablet (8 mg total) by mouth every 8 (eight) hours as needed for nausea or vomiting. 11/16/18   Borders, Kirt Boys, NP  rosuvastatin (CRESTOR) 10 MG tablet TAKE 1 TABLET (10 MG TOTAL) BY MOUTH DAILY. 01/07/19   Tonia Ghent, MD  trametinib dimethyl sulfoxide (MEKINIST) 2 MG tablet Take 1 tablet (2 mg total) by mouth daily. Take 1 hour before or 2 hours after a meal. Store refrigerated in original container. 11/14/18   Earlie Server, MD  triamcinolone cream (KENALOG) 0.1 % Apply 1 application topically 2 (two) times daily. 01/12/19   Earlie Server, MD    Allergies  Allergen Reactions  . Lipitor [Atorvastatin Calcium] Other (See Comments)    Aches.  Tolerated crestor.     Family History  Problem Relation Age of Onset  . Dementia Mother   . Heart disease Father   . Colon cancer Neg Hx   . Prostate cancer Neg Hx   . Diabetes Neg Hx     Social History Social History   Tobacco Use  . Smoking status: Current Every Day Smoker    Packs/day: 0.20    Years: 45.00    Pack years: 9.00    Types: Cigarettes  . Smokeless tobacco: Former Systems developer    Types: Snuff  Substance Use Topics  . Alcohol use: Yes    Alcohol/week: 6.0 standard drinks    Types: 6 Cans of beer per week    Comment: occ, average 6 pack in a week  . Drug use: No    Review of Systems Constitutional: Negative for  fever. Cardiovascular: Negative for chest pain. Respiratory: Positive for shortness of breath. Gastrointestinal: Negative for abdominal pain.  One episode of loose stool recently. Musculoskeletal: Negative for musculoskeletal complaints Neurological: Negative for headache All other ROS negative  ____________________________________________   PHYSICAL EXAM:  VITAL SIGNS: ED Triage Vitals  Enc Vitals Group     BP      Pulse      Resp      Temp      Temp src      SpO2      Weight      Height      Head Circumference  Peak Flow      Pain Score      Pain Loc      Pain Edu?      Excl. in [place]?     Constitutional: Alert and oriented. Well appearing and in no distress. Eyes: Normal exam ENT      Head: Normocephalic and atraumatic.      Mouth/Throat: Mucous membranes are moist. Cardiovascular: Normal rate, regular rhythm. No murmur Respiratory: Mild tachypnea.  Overall clear lung sounds bilaterally without significant wheeze rales or rhonchi. Gastrointestinal: Soft and nontender. No distention.  Musculoskeletal: Nontender with normal range of motion in all extremities.  Mild pedal edema. Neurologic:  Normal speech and language. No gross focal neurologic deficits Skin:  Skin is warm, dry and intact.  Psychiatric: Mood and affect are normal.   ____________________________________________    EKG  EKG viewed and interpreted by myself shows atrial fibrillation 143 bpm with a widened QRS, normal axis, largely normal intervals with nonspecific ST changes.  Does have T wave inversions in the inferolateral leads.  Changes are new from prior EKG 08/23/2018.  ____________________________________________    RADIOLOGY  Chest x-ray shows persistent left-sided effusion but no acute abnormality.  ____________________________________________   INITIAL IMPRESSION / ASSESSMENT AND PLAN / ED COURSE  Pertinent labs & imaging results that were available during my care of the patient  were reviewed by me and considered in my medical decision making (see chart for details).   Patient presents to the emergency department for shortness of breath.  Differential would include worsening lung cancer, pneumonia, postobstructive pneumonia, Covid, pneumothorax, ACS or pulmonary edema.  We will check labs, chest x-ray and continue to closely monitor.  We will also swab for coronavirus as a precaution.  Chest x-ray is consistent with effusion which is stable from last x-ray.  No acute abnormality.  Patient's VBG is resulted with a pH less than 6.9, fingerstick blood glucose greater than 600 consistent with DKA.  Patient's Covid test is negative.  Patient will require admission to the hospital for DKA.  We will start the patient on insulin infusion once his potassium level is known.  Patient's initial lab hemolyzed and they are recollecting it currently.  We will discussed with the hospitalist for admission to likely stepdown unit.  Highly suspect the patient is DKA as a cause of his dyspnea.  Patient is in A. fib with RVR which would be contributing to his dyspnea as well, possibly induced by significant dehydration/tachycardia.  For his new onset atrial fibrillation with rapid ventricular response we will start on diltiazem.    Estanislado Surgeon Hosang was evaluated in Emergency Department on 03/03/2019 for the symptoms described in the history of present illness. He was evaluated in the context of the global COVID-19 pandemic, which necessitated consideration that the patient might be at risk for infection with the SARS-CoV-2 virus that causes COVID-19. Institutional protocols and algorithms that pertain to the evaluation of patients at risk for COVID-19 are in a state of rapid change based on information released by regulatory bodies including the CDC and federal and state organizations. These policies and algorithms were followed during the patient's care in the ED.  CRITICAL CARE Performed by: Harvest Dark   Total critical care time: 30 minutes  Critical care time was exclusive of separately billable procedures and treating other patients.  Critical care was necessary to treat or prevent imminent or life-threatening deterioration.  Critical care was time spent personally by me on the following activities:  development of treatment plan with patient and/or surrogate as well as nursing, discussions with consultants, evaluation of patient's response to treatment, examination of patient, obtaining history from patient or surrogate, ordering and performing treatments and interventions, ordering and review of laboratory studies, ordering and review of radiographic studies, pulse oximetry and re-evaluation of patient's condition.  ____________________________________________   FINAL CLINICAL IMPRESSION(S) / ED DIAGNOSES  Dyspnea Diabetic ketoacidosis   Harvest Dark, MD 03/03/19 1442    Harvest Dark, MD 03/03/19 1517

## 2019-03-03 NOTE — Progress Notes (Addendum)
Progress review of patient admitted with DKA and atrial fibrillation with RVR,  Elevated troponin likely NSTEMI from demand ischemia, and AKI .  Severe acidosis with pH 6.9 on presentation.  Current metabolic panel reviewed.  Total fluid boluses thus far 2 liters and received 1 amp bicarb.  Current corrected sodium 133, creatinine worsening at 1.79 and BP soft 106/71, uptrending troponin now greater than 27000,  Blood glucose 756 and anion gap 26. Also noted worsening mild transaminitis with increased AST.   Additional 1 liter normal saline bolus ordered.  Discussed with Dr. Blaine Hamper. Cardiology to come to see patient as well tonight Given worsening of patients multiple aucte issues as described above , ICU team consulted and patient upgraded to ICU status  Night APP paged to discuss patient

## 2019-03-03 NOTE — ED Notes (Signed)
Pt shoes and watch placed in pt belongings bag and taken to ICU with pt

## 2019-03-03 NOTE — H&P (Addendum)
History and Physical    [name] KZS:[id number] DOB: 07-20-1955 DOA: 03/03/2019  Referring MD/NP/PA:   PCP: Craig Ghent, MD   Patient coming from:  The patient is coming from home.  At baseline, pt is independent for most of ADL.        Chief Complaint: Shortness of breath and generalized weakness  HPI: Craig Koch is a 64 y.o. male with medical history significant of hypertension, hyperlipidemia, diabetes mellitus, depression, anxiety, lung cancer, CAD, CABG, tobacco abuse, alcohol abuse, left pleural effusion, who presents with shortness of breath and generalized weakness  Patient states that he has been having shortness of breath, dry cough and generalized weakness the past several days, which has worsened today.  Denies chest pain, fever or chills.  He states that he normally has O2 saturation at about the 90s.  Patient is using accessory muscle for breathing.  Denies nausea, no vomiting, diarrhea, abdominal pain.  Denies symptoms of UTI or unilateral weakness.  No recent fall.  No rectal bleeding or dark stool.  ED Course: pt was found to have DKA (blood sugar 718, bicarbonate<7.0, anion gap is not calculated, but AG is >26), pending urinalysis, beta hydroxybutyric acid >8.0.  BNP is 330.  Pseudohyponatremia.  AKI with creatinine 1.60, BUN 37.  Temperature 97.2, blood pressure 106/72, atrial fibrillation with RVR on EKG, tachypnea, oxygen saturation 100% on 2 L oxygen.  VBG showed pH< 6.9, CO2 30 and O2 51 chest x-ray showed left pleural effusion.  Patient is admitted to stepdown as inpatient.   Review of Systems:   General: no fevers, chills, no body weight gain, has poor appetite, has fatigue HEENT: no blurry vision, hearing changes or sore throat Respiratory: has dyspnea, coughing, no wheezing CV: no chest pain, no palpitations GI: has nausea, no vomiting, abdominal pain, diarrhea, constipation GU: no dysuria, burning on urination, increased urinary frequency,  hematuria  Ext: no leg edema Neuro: no unilateral weakness, numbness, or tingling, no vision change or hearing loss Skin: no rash, no skin tear. MSK: No muscle spasm, no deformity, no limitation of range of movement in spin Heme: No easy bruising.  Travel history: No recent long distant travel.  Allergy:  Allergies  Allergen Reactions  . Lipitor [Atorvastatin Calcium] Other (See Comments)    Aches.  Tolerated crestor.     Past Medical History:  Diagnosis Date  . Anxiety   . Arthritis   . Coronary artery disease   . Diabetes mellitus   . Dyslipidemia   . Hx of CABG   . Hyperlipidemia   . Hypertension   . Malignant neoplasm of unspecified part of unspecified bronchus or lung ([place]) 08/2018   Immunotherapy  . Pleural effusion     Past Surgical History:  Procedure Laterality Date  . CATARACT EXTRACTION Left 09/2015  . CHEST TUBE INSERTION Left 10/01/2018   Procedure: INSERTION PLEURAL DRAINAGE CATHETER;  Surgeon: Craig Lewandowsky, MD;  Location: [facility] ORS;  Service: Thoracic;  Laterality: Left;  . CORONARY ARTERY BYPASS GRAFT  2004   (CABG with LIMA to the  LAD, SVG to OM2/OM3, SVG  to diag  . Left ankle surgery     repair of fracture  . Right lower leg surgery     rod    Social History:  reports that he has been smoking cigarettes. He has a 9.00 pack-year smoking history. He has quit using smokeless tobacco.  His smokeless tobacco use included snuff. He reports current alcohol use of about  6.0 standard drinks of alcohol per week. He reports that he does not use drugs.  Family History:  Family History  Problem Relation Age of Onset  . Dementia Mother   . Heart disease Father   . Colon cancer Neg Hx   . Prostate cancer Neg Hx   . Diabetes Neg Hx      Prior to Admission medications   Medication Sig Start Date End Date Taking? Authorizing Provider  albuterol (VENTOLIN HFA) 108 (90 Base) MCG/ACT inhaler Inhale 2 puffs into the lungs every 6 (six) hours as needed for  wheezing or shortness of breath. 08/24/18   Craig Grayer, MD  BD INSULIN SYRINGE U/F 31G X 5/16" 0.5 ML MISC USE DAILY AS DIRECTED. DX E11.9 02/20/19   Craig Ghent, MD  dabrafenib mesylate (TAFINLAR) 75 MG capsule Take 2 capsules (150 mg total) by mouth 2 (two) times daily. Take on an empty stomach 1 hour before or 2 hours after meals. 11/14/18   Craig Server, MD  dronabinol (MARINOL) 5 MG capsule Take 1 capsule (5 mg total) by mouth 2 (two) times daily before a meal. 11/09/18   Craig Server, MD  DULoxetine (CYMBALTA) 30 MG capsule TAKE 1 CAPSULE BY MOUTH EVERY DAY Patient taking differently: Take 30 mg by mouth daily.  02/06/19   Craig Ghent, MD  glucose blood (ONE TOUCH ULTRA TEST) test strip USE TO TEST GLUCOSE LEVELS 4 (FOUR) TIMES DAILY. 02/14/19   Craig Ghent, MD  insulin detemir (LEVEMIR) 100 UNIT/ML injection Inject 0.4-0.6 mLs (40-60 Units total) into the skin at bedtime. Start at 40 units at night.  Add 1 unit per day until AM sugar is ~150 12/17/18   Craig Ghent, MD  insulin lispro (HUMALOG) 100 UNIT/ML injection Inject 0.2 mLs (20 Units total) into the skin 3 (three) times daily with meals. 12/17/18   Craig Ghent, MD  metFORMIN (GLUCOPHAGE) 500 MG tablet TAKE 1 TABLET (500 MG TOTAL) BY MOUTH 2 (TWO) TIMES DAILY WITH A MEAL. 02/14/19   Craig Ghent, MD  metoprolol tartrate (LOPRESSOR) 100 MG tablet Take 0.5 tablets (50 mg total) by mouth daily. 12/17/18   Craig Ghent, MD  ondansetron (ZOFRAN) 8 MG tablet Take 1 tablet (8 mg total) by mouth every 8 (eight) hours as needed for nausea or vomiting. 11/16/18   Borders, Craig Boys, NP  rosuvastatin (CRESTOR) 10 MG tablet TAKE 1 TABLET (10 MG TOTAL) BY MOUTH DAILY. 01/07/19   Craig Ghent, MD  trametinib dimethyl sulfoxide (MEKINIST) 2 MG tablet Take 1 tablet (2 mg total) by mouth daily. Take 1 hour before or 2 hours after a meal. Store refrigerated in original container. 11/14/18   Craig Server, MD  triamcinolone cream (KENALOG)  0.1 % Apply 1 application topically 2 (two) times daily. 01/12/19   Craig Server, MD    Physical Exam: Vitals:   03/03/19 1328 03/03/19 1332 03/03/19 1400  BP:  125/68 106/72  Pulse:  (!) 131   Resp:  (!) 25 (!) 26  Temp:  (!) 97.2 F (36.2 C)   TempSrc:  Axillary   SpO2:  100%   Weight: 86.2 kg    Height: 6' (1.829 m)     General: Not in acute distress.  Dry mucus and membrane HEENT:       Eyes: PERRL, EOMI, no scleral icterus.       ENT: No discharge from the ears and nose, no pharynx injection, no tonsillar enlargement.  Neck: No JVD, no bruit, no mass felt. Heme: No neck lymph node enlargement. Cardiac: S1/S2, RRR, No murmurs, No gallops or rubs. Respiratory:  No rales, wheezing, rhonchi or rubs. GI: Soft, nondistended, nontender, no rebound pain, no organomegaly, BS present. GU: No hematuria Ext: No pitting leg edema bilaterally. 2+DP/PT pulse bilaterally. Musculoskeletal: No joint deformities, No joint redness or warmth, no limitation of ROM in spin. Skin: No rashes.  Neuro: Alert, oriented X3, cranial nerves II-XII grossly intact, moves all extremities normally. Psych: Patient is not psychotic, no suicidal or hemocidal ideation.  Labs on Admission: I have personally reviewed following labs and imaging studies  CBC: Recent Labs  Lab 03/03/19 1330  WBC 9.4  NEUTROABS 7.3  HGB 13.4  HCT 42.9  MCV 87.4  PLT 962   Basic Metabolic Panel: Recent Labs  Lab 03/03/19 1547  NA 123*  K 5.1  CL 89*  CO2 <7*  GLUCOSE 718*  BUN 37*  CREATININE 1.60*  CALCIUM 8.2*   GFR: Estimated Creatinine Clearance: 51.9 mL/min (A) (by C-G formula based on SCr of 1.6 mg/dL (H)). Liver Function Tests: Recent Labs  Lab 03/03/19 1547  AST 81*  ALT 18  ALKPHOS 80  BILITOT 2.3*  PROT 6.6  ALBUMIN 2.9*   No results for input(s): LIPASE, AMYLASE in the last 168 hours. No results for input(s): AMMONIA in the last 168 hours. Coagulation Profile: Recent Labs  Lab  03/03/19 1750  INR 1.2   Cardiac Enzymes: No results for input(s): CKTOTAL, CKMB, CKMBINDEX, TROPONINI in the last 168 hours. BNP (last 3 results) No results for input(s): PROBNP in the last 8760 hours. HbA1C: No results for input(s): HGBA1C in the last 72 hours. CBG: Recent Labs  Lab 03/03/19 1328 03/03/19 1803  GLUCAP >600* >600*   Lipid Profile: No results for input(s): CHOL, HDL, LDLCALC, TRIG, CHOLHDL, LDLDIRECT in the last 72 hours. Thyroid Function Tests: No results for input(s): TSH, T4TOTAL, FREET4, T3FREE, THYROIDAB in the last 72 hours. Anemia Panel: No results for input(s): VITAMINB12, FOLATE, FERRITIN, TIBC, IRON, RETICCTPCT in the last 72 hours. Urine analysis: No results found for: COLORURINE, APPEARANCEUR, LABSPEC, PHURINE, GLUCOSEU, HGBUR, BILIRUBINUR, KETONESUR, PROTEINUR, UROBILINOGEN, NITRITE, LEUKOCYTESUR Sepsis Labs: @LABRCNTIP (procalcitonin:4,lacticidven:4) ) Recent Results (from the past 240 hour(s))  Respiratory Panel by RT PCR (Flu A&B, Covid) - Nasopharyngeal Swab     Status: None   Collection Time: 03/03/19  3:31 PM   Specimen: Nasopharyngeal Swab  Result Value Ref Range Status   SARS Coronavirus 2 by RT PCR NEGATIVE NEGATIVE Final    Comment: (NOTE) SARS-CoV-2 target nucleic acids are NOT DETECTED. The SARS-CoV-2 RNA is generally detectable in upper respiratoy specimens during the acute phase of infection. The lowest concentration of SARS-CoV-2 viral copies this assay can detect is 131 copies/mL. A negative result does not preclude SARS-Cov-2 infection and should not be used as the sole basis for treatment or other patient management decisions. A negative result may occur with  improper specimen collection/handling, submission of specimen other than nasopharyngeal swab, presence of viral mutation(s) within the areas targeted by this assay, and inadequate number of viral copies (<131 copies/mL). A negative result must be combined with  clinical observations, patient history, and epidemiological information. The expected result is Negative. Fact Sheet for Patients:  PinkCheek.be Fact Sheet for Healthcare Providers:  GravelBags.it This test is not yet ap proved or cleared by the [place] FDA and  has been authorized for detection and/or diagnosis of SARS-CoV-2 by FDA under an  Emergency Use Authorization (EUA). This EUA will remain  in effect (meaning this test can be used) for the duration of the COVID-19 declaration under Section 564(b)(1) of the Act, 21 U.S.C. section 360bbb-3(b)(1), unless the authorization is terminated or revoked sooner.    Influenza A by PCR NEGATIVE NEGATIVE Final   Influenza B by PCR NEGATIVE NEGATIVE Final    Comment: (NOTE) The Xpert Xpress SARS-CoV-2/FLU/RSV assay is intended as an aid in  the diagnosis of influenza from Nasopharyngeal swab specimens and  should not be used as a sole basis for treatment. Nasal washings and  aspirates are unacceptable for Xpert Xpress SARS-CoV-2/FLU/RSV  testing. Fact Sheet for Patients: PinkCheek.be Fact Sheet for Healthcare Providers: GravelBags.it This test is not yet approved or cleared by the [place] FDA and  has been authorized for detection and/or diagnosis of SARS-CoV-2 by  FDA under an Emergency Use Authorization (EUA). This EUA will remain  in effect (meaning this test can be used) for the duration of the  Covid-19 declaration under Section 564(b)(1) of the Act, 21  U.S.C. section 360bbb-3(b)(1), unless the authorization is  terminated or revoked. Performed at [facility], [address] [place], [place] [address]      Radiological Exams on Admission: DG Chest Portable 1 View  Result Date: 03/03/2019 CLINICAL DATA:  Shortness of breath, since Monday worsening this morning around 1 a.m. EXAM: PORTABLE  CHEST 1 VIEW COMPARISON:  12/19/2018 FINDINGS: Cardiomediastinal contours are stable following median sternotomy with persistent left-sided pleural effusion and pleural-parenchymal scarring. No new areas of consolidation or evidence of right-sided pleural effusion. No signs of acute bone process. IMPRESSION: Persistent left-sided pleural effusion and pleural-parenchymal scarring. Electronically Signed   By: Zetta Bills M.D.   On: 03/03/2019 13:59     EKG: Independently reviewed.  Atrial fibrillation with RVR, heart rate 143, incomplete left bundle blockade, QTC 485  Assessment/Plan Principal Problem:   Atrial fibrillation with RVR (HCC) Active Problems:   HLD (hyperlipidemia)   Essential hypertension   History of alcohol use   CAD (coronary artery disease), native coronary artery   lung adenocarcinoma   Tobacco abuse   Uncontrolled type 2 diabetes mellitus with hyperglycemia (HCC)   DKA (diabetic ketoacidoses) (HCC)   Hypothermia   SOB (shortness of breath)   AKI (acute kidney injury) ([place])   New onset atrial fibrillation with RVR [facility]): Patient does not have history of atrial fibrillation.  Today patient developed atrial fibrillation with RVR, heart rate up to 140s.  Currently hemodynamically stable.  Denies chest pain.  -admit to SDU inpt - start IV heparin - start Cardizem gtt -continue home metoprolol -trop x 3 -check TSH, A1c, FLP -Get 2D echo -Message sent to Dr. Rockey Situ of cardiology for consultation  Addendum: Trop is elevated at 133[address], he has NSTEMI -continue IV heparin and crestor -ASA   HLD (hyperlipidemia):  -Crestor  DKA (diabetic ketoacidoses): - 2L of NS bolus - start DKA protocol with BMP q4h - IVF: LR at 75 cc/h, will switch to D5-LR at 75 cc/h when CBG<250 - replete K as needed - Zofran prn nausea  - NPO   HTN: Blood pressure 106/72 -Patient is on Cardizem drip -Continue home medications: Metoprolol  CAD (coronary artery disease), native  coronary artery:  No CP -on Crestor  Lung adenocarcinoma: stage IV. Following with oncologist Dr.Yu -on Dabrafenib ,   Tobacco abuse and Alcohol abuse: -Nicotine patch -CIWA protocol  Uncontrolled type 2 diabetes mellitus with hyperglycemia: Most recent  A1c 10.6, poorly controled. Patient is taking Metformin, Humalog, Levemir at home -Currently on DKA protocol  Hypothermia: -Bair hugger  SOB (shortness of breath): Etiology is not clear.  Patient has elevated BNP 330, but no history of congestive heart failure.  2D echo 08/24/2018 showed EF 55-60%.  Clinically patient is dry.  Denies chest pain.  Low suspicions for PE, though cannot rule out this possibility completely.  Most likely explanation is due to to DKA and atrial fibrillation. -Treat patient with bronchodilators. -Observe closely.  If patient does not improve with control for DKA and atrial fibrillation, may consider to do PE work-up.  AKI: Likely due to dehydration - IVF as above - Follow up renal function by BMP - Avoid using renal toxic medications, hypotension and contrast dye (or carefully use)      DVT ppx: on IV  Heparin  Code Status: per his wife, pt is Partial code (OK for CPR, but no intubation). Family Communication:   Yes, patient's wife by phone Disposition Plan:  Anticipate discharge back to previous home environment Consults called:  Message sent to Dr. Rockey Situ of card Admission status: SDU/inpation       Date of Service 03/03/2019    [facility]   If 7PM-7AM, please contact night-coverage [email or web address] 03/03/2019, 6:33 PM

## 2019-03-04 ENCOUNTER — Inpatient Hospital Stay (HOSPITAL_COMMUNITY)
Admit: 2019-03-04 | Discharge: 2019-03-04 | Disposition: A | Discharge status: Home / Self Care | Payer: Medicare Other | Attending: Cardiovascular Disease | Admitting: Cardiovascular Disease

## 2019-03-04 ENCOUNTER — Inpatient Hospital Stay: Payer: Medicare Other

## 2019-03-04 DIAGNOSIS — I214 Non-ST elevation (NSTEMI) myocardial infarction: Secondary | ICD-10-CM

## 2019-03-04 DIAGNOSIS — Z515 Encounter for palliative care: Secondary | ICD-10-CM

## 2019-03-04 DIAGNOSIS — R4182 Altered mental status, unspecified: Secondary | ICD-10-CM

## 2019-03-04 DIAGNOSIS — N179 Acute kidney failure, unspecified: Secondary | ICD-10-CM

## 2019-03-04 LAB — GLUCOSE, CAPILLARY
Glucose-Capillary: 117 mg/dL — ABNORMAL HIGH (ref 70–99)
Glucose-Capillary: 157 mg/dL — ABNORMAL HIGH (ref 70–99)
Glucose-Capillary: 158 mg/dL — ABNORMAL HIGH (ref 70–99)
Glucose-Capillary: 159 mg/dL — ABNORMAL HIGH (ref 70–99)
Glucose-Capillary: 160 mg/dL — ABNORMAL HIGH (ref 70–99)
Glucose-Capillary: 169 mg/dL — ABNORMAL HIGH (ref 70–99)
Glucose-Capillary: 170 mg/dL — ABNORMAL HIGH (ref 70–99)
Glucose-Capillary: 173 mg/dL — ABNORMAL HIGH (ref 70–99)
Glucose-Capillary: 184 mg/dL — ABNORMAL HIGH (ref 70–99)
Glucose-Capillary: 202 mg/dL — ABNORMAL HIGH (ref 70–99)
Glucose-Capillary: 278 mg/dL — ABNORMAL HIGH (ref 70–99)
Glucose-Capillary: 293 mg/dL — ABNORMAL HIGH (ref 70–99)
Glucose-Capillary: 300 mg/dL — ABNORMAL HIGH (ref 70–99)
Glucose-Capillary: 301 mg/dL — ABNORMAL HIGH (ref 70–99)
Glucose-Capillary: 331 mg/dL — ABNORMAL HIGH (ref 70–99)
Glucose-Capillary: 379 mg/dL — ABNORMAL HIGH (ref 70–99)
Glucose-Capillary: 443 mg/dL — ABNORMAL HIGH (ref 70–99)

## 2019-03-04 LAB — PROCALCITONIN: Procalcitonin: 19.38 ng/mL

## 2019-03-04 LAB — BASIC METABOLIC PANEL
Anion gap: 11 (ref 5–15)
Anion gap: 18 — ABNORMAL HIGH (ref 5–15)
BUN: 48 mg/dL — ABNORMAL HIGH (ref 8–23)
BUN: 53 mg/dL — ABNORMAL HIGH (ref 8–23)
CO2: 13 mmol/L — ABNORMAL LOW (ref 22–32)
CO2: 14 mmol/L — ABNORMAL LOW (ref 22–32)
Calcium: 8 mg/dL — ABNORMAL LOW (ref 8.9–10.3)
Calcium: 8 mg/dL — ABNORMAL LOW (ref 8.9–10.3)
Chloride: 102 mmol/L (ref 98–111)
Chloride: 96 mmol/L — ABNORMAL LOW (ref 98–111)
Creatinine, Ser: 1.98 mg/dL — ABNORMAL HIGH (ref 0.61–1.24)
Creatinine, Ser: 2.13 mg/dL — ABNORMAL HIGH (ref 0.61–1.24)
GFR calc Af Amer: 37 mL/min — ABNORMAL LOW (ref >60)
GFR calc Af Amer: 40 mL/min — ABNORMAL LOW (ref >60)
GFR calc non Af Amer: 32 mL/min — ABNORMAL LOW (ref >60)
GFR calc non Af Amer: 35 mL/min — ABNORMAL LOW (ref >60)
Glucose, Bld: 134 mg/dL — ABNORMAL HIGH (ref 70–99)
Glucose, Bld: 440 mg/dL — ABNORMAL HIGH (ref 70–99)
Potassium: 3.5 mmol/L (ref 3.5–5.1)
Potassium: 3.7 mmol/L (ref 3.5–5.1)
Sodium: 127 mmol/L — ABNORMAL LOW (ref 135–145)
Sodium: 127 mmol/L — ABNORMAL LOW (ref 135–145)

## 2019-03-04 LAB — BASIC METABOLIC PANEL WITH GFR
Anion gap: 14 (ref 5–15)
BUN: 53 mg/dL — ABNORMAL HIGH (ref 8–23)
CO2: 14 mmol/L — ABNORMAL LOW (ref 22–32)
Calcium: 8.1 mg/dL — ABNORMAL LOW (ref 8.9–10.3)
Chloride: 99 mmol/L (ref 98–111)
Creatinine, Ser: 1.88 mg/dL — ABNORMAL HIGH (ref 0.61–1.24)
GFR calc Af Amer: 43 mL/min — ABNORMAL LOW
GFR calc non Af Amer: 37 mL/min — ABNORMAL LOW
Glucose, Bld: 177 mg/dL — ABNORMAL HIGH (ref 70–99)
Potassium: 3.5 mmol/L (ref 3.5–5.1)
Sodium: 127 mmol/L — ABNORMAL LOW (ref 135–145)

## 2019-03-04 LAB — CBC
HCT: 34.2 % — ABNORMAL LOW (ref 39.0–52.0)
HCT: 36.2 % — ABNORMAL LOW (ref 39.0–52.0)
Hemoglobin: 12.2 g/dL — ABNORMAL LOW (ref 13.0–17.0)
Hemoglobin: 12.2 g/dL — ABNORMAL LOW (ref 13.0–17.0)
MCH: 27.5 pg (ref 26.0–34.0)
MCH: 27.7 pg (ref 26.0–34.0)
MCHC: 33.7 g/dL (ref 30.0–36.0)
MCHC: 35.7 g/dL (ref 30.0–36.0)
MCV: 77 fL — ABNORMAL LOW (ref 80.0–100.0)
MCV: 82.1 fL (ref 80.0–100.0)
Platelets: 192 10*3/uL (ref 150–400)
Platelets: 210 10*3/uL (ref 150–400)
RBC: 4.41 MIL/uL (ref 4.22–5.81)
RBC: 4.44 MIL/uL (ref 4.22–5.81)
RDW: 16.4 % — ABNORMAL HIGH (ref 11.5–15.5)
RDW: 16.7 % — ABNORMAL HIGH (ref 11.5–15.5)
WBC: 8.5 10*3/uL (ref 4.0–10.5)
WBC: 9.9 10*3/uL (ref 4.0–10.5)
nRBC: 0 % (ref 0.0–0.2)
nRBC: 0 % (ref 0.0–0.2)

## 2019-03-04 LAB — HEPARIN LEVEL (UNFRACTIONATED)
Heparin Unfractionated: 0.24 IU/mL — ABNORMAL LOW (ref 0.30–0.70)
Heparin Unfractionated: 0.26 IU/mL — ABNORMAL LOW (ref 0.30–0.70)
Heparin Unfractionated: 0.28 IU/mL — ABNORMAL LOW (ref 0.30–0.70)
Heparin Unfractionated: 0.31 IU/mL (ref 0.30–0.70)

## 2019-03-04 LAB — LIPID PANEL
Cholesterol: 138 mg/dL (ref 0–200)
HDL: 15 mg/dL — ABNORMAL LOW (ref >40)
LDL Cholesterol: 81 mg/dL (ref 0–99)
Total CHOL/HDL Ratio: 9.2 RATIO
Triglycerides: 209 mg/dL — ABNORMAL HIGH (ref <150)
VLDL: 42 mg/dL — ABNORMAL HIGH (ref 0–40)

## 2019-03-04 LAB — HEMOGLOBIN A1C
Hgb A1c MFr Bld: 11.3 % — ABNORMAL HIGH (ref 4.8–5.6)
Mean Plasma Glucose: 277.61 mg/dL

## 2019-03-04 LAB — ECHOCARDIOGRAM COMPLETE
Height: 72 in
Weight: 2959.46 [oz_av]

## 2019-03-04 LAB — BETA-HYDROXYBUTYRIC ACID
Beta-Hydroxybutyric Acid: 6.93 mmol/L — ABNORMAL HIGH (ref 0.05–0.27)
Beta-Hydroxybutyric Acid: 0.45 mmol/L — ABNORMAL HIGH (ref 0.05–0.27)

## 2019-03-04 LAB — HIV ANTIBODY (ROUTINE TESTING W REFLEX): HIV Screen 4th Generation wRfx: NONREACTIVE

## 2019-03-04 LAB — TSH: TSH: 3.184 u[IU]/mL (ref 0.350–4.500)

## 2019-03-04 MED ORDER — METOPROLOL TARTRATE 25 MG PO TABS
25.0000 mg | ORAL_TABLET | Freq: Two times a day (BID) | ORAL | Status: DC
  Administered 2019-03-06: 25 mg via ORAL
  Filled 2019-03-04: qty 1

## 2019-03-04 MED ORDER — TRAMETINIB DIMETHYL SULFOXIDE 2 MG PO TABS: 2.0000 mg | ORAL_TABLET | Freq: Every day | ORAL | Status: DC

## 2019-03-04 MED ORDER — FUROSEMIDE 10 MG/ML IJ SOLN
40.0000 mg | Freq: Every day | INTRAMUSCULAR | Status: DC
  Administered 2019-03-04 – 2019-03-05 (×2): 40 mg via INTRAVENOUS
  Filled 2019-03-04 (×2): qty 4

## 2019-03-04 MED ORDER — DABRAFENIB MESYLATE 75 MG PO CAPS: 150.0000 mg | ORAL_CAPSULE | Freq: Two times a day (BID) | ORAL | Status: DC

## 2019-03-04 MED ORDER — METOPROLOL TARTRATE 50 MG PO TABS: 50.0000 mg | ORAL_TABLET | Freq: Every day | ORAL | Status: DC

## 2019-03-04 MED ORDER — IPRATROPIUM-ALBUTEROL 0.5-2.5 (3) MG/3ML IN SOLN: 3.0000 mL | RESPIRATORY_TRACT | Status: DC

## 2019-03-04 MED ORDER — POTASSIUM CHLORIDE 10 MEQ/100ML IV SOLN
10.0000 meq | INTRAVENOUS | Status: AC
  Administered 2019-03-04 (×4): 10 meq via INTRAVENOUS
  Filled 2019-03-04 (×4): qty 100

## 2019-03-04 MED ORDER — DRONABINOL 2.5 MG PO CAPS
5.0000 mg | ORAL_CAPSULE | Freq: Two times a day (BID) | ORAL | Status: DC
  Administered 2019-03-04 – 2019-03-19 (×23): 5 mg via ORAL
  Filled 2019-03-04 (×17): qty 2
  Filled 2019-03-04: qty 1
  Filled 2019-03-04 (×3): qty 2
  Filled 2019-03-04: qty 1
  Filled 2019-03-04 (×4): qty 2

## 2019-03-04 MED ORDER — IPRATROPIUM-ALBUTEROL 0.5-2.5 (3) MG/3ML IN SOLN
3.0000 mL | RESPIRATORY_TRACT | Status: DC
  Administered 2019-03-04 – 2019-03-05 (×6): 3 mL via RESPIRATORY_TRACT
  Filled 2019-03-04 (×6): qty 3

## 2019-03-04 MED ORDER — AMIODARONE HCL IN DEXTROSE 360-4.14 MG/200ML-% IV SOLN
60.0000 mg/h | INTRAVENOUS | Status: AC
  Administered 2019-03-04: 60 mg/h via INTRAVENOUS
  Filled 2019-03-04: qty 200

## 2019-03-04 MED ORDER — DULOXETINE HCL 30 MG PO CPEP
30.0000 mg | ORAL_CAPSULE | Freq: Every day | ORAL | Status: DC
  Administered 2019-03-04 – 2019-03-19 (×15): 30 mg via ORAL
  Filled 2019-03-04 (×15): qty 1

## 2019-03-04 MED ORDER — IPRATROPIUM-ALBUTEROL 0.5-2.5 (3) MG/3ML IN SOLN: 3.0000 mL | Freq: Four times a day (QID) | RESPIRATORY_TRACT | Status: DC | PRN

## 2019-03-04 MED ORDER — ROSUVASTATIN CALCIUM 10 MG PO TABS
10.0000 mg | ORAL_TABLET | Freq: Every day | ORAL | Status: DC
  Administered 2019-03-04 – 2019-03-19 (×15): 10 mg via ORAL
  Filled 2019-03-04 (×16): qty 1

## 2019-03-04 MED ORDER — AMIODARONE IV BOLUS ONLY 150 MG/100ML
150.0000 mg | Freq: Once | INTRAVENOUS | Status: AC
  Administered 2019-03-04: 150 mg via INTRAVENOUS
  Filled 2019-03-04: qty 100

## 2019-03-04 MED ORDER — CHLORHEXIDINE GLUCONATE CLOTH 2 % EX PADS
6.0000 | MEDICATED_PAD | Freq: Every day | CUTANEOUS | Status: DC
  Administered 2019-03-10 – 2019-03-11 (×2): 6 via TOPICAL

## 2019-03-04 MED ORDER — INSULIN GLARGINE 100 UNIT/ML ~~LOC~~ SOLN
20.0000 [IU] | SUBCUTANEOUS | Status: DC
  Administered 2019-03-04: 20 [IU] via SUBCUTANEOUS
  Filled 2019-03-04 (×2): qty 0.2

## 2019-03-04 MED ORDER — INSULIN ASPART 100 UNIT/ML ~~LOC~~ SOLN
0.0000 [IU] | SUBCUTANEOUS | Status: DC
  Administered 2019-03-04: 8 [IU] via SUBCUTANEOUS
  Administered 2019-03-04: 11 [IU] via SUBCUTANEOUS
  Administered 2019-03-05 (×2): 3 [IU] via SUBCUTANEOUS
  Administered 2019-03-05: 2 [IU] via SUBCUTANEOUS
  Administered 2019-03-05: 3 [IU] via SUBCUTANEOUS
  Administered 2019-03-05: 8 [IU] via SUBCUTANEOUS
  Administered 2019-03-05: 5 [IU] via SUBCUTANEOUS
  Administered 2019-03-05: 8 [IU] via SUBCUTANEOUS
  Administered 2019-03-06: 2 [IU] via SUBCUTANEOUS
  Administered 2019-03-06: 3 [IU] via SUBCUTANEOUS
  Administered 2019-03-06: 2 [IU] via SUBCUTANEOUS
  Administered 2019-03-06: 5 [IU] via SUBCUTANEOUS
  Administered 2019-03-07: 2 [IU] via SUBCUTANEOUS
  Administered 2019-03-09: 5 [IU] via SUBCUTANEOUS
  Administered 2019-03-09 – 2019-03-10 (×4): 3 [IU] via SUBCUTANEOUS
  Administered 2019-03-10: 2 [IU] via SUBCUTANEOUS
  Administered 2019-03-10: 3 [IU] via SUBCUTANEOUS
  Administered 2019-03-10 – 2019-03-11 (×2): 2 [IU] via SUBCUTANEOUS
  Administered 2019-03-11: 3 [IU] via SUBCUTANEOUS
  Administered 2019-03-11: 2 [IU] via SUBCUTANEOUS
  Administered 2019-03-11 – 2019-03-12 (×3): 3 [IU] via SUBCUTANEOUS
  Filled 2019-03-04 (×27): qty 1

## 2019-03-04 MED ORDER — AMIODARONE HCL IN DEXTROSE 360-4.14 MG/200ML-% IV SOLN
30.0000 mg/h | INTRAVENOUS | Status: DC
  Administered 2019-03-04 – 2019-03-06 (×5): 30 mg/h via INTRAVENOUS
  Filled 2019-03-04 (×5): qty 200

## 2019-03-04 MED ORDER — PNEUMOCOCCAL VAC POLYVALENT 25 MCG/0.5ML IJ INJ: 0.5000 mL | INJECTION | INTRAMUSCULAR | Status: DC | PRN

## 2019-03-04 MED ORDER — HEPARIN BOLUS VIA INFUSION
1200.0000 [IU] | Freq: Once | INTRAVENOUS | Status: AC
  Administered 2019-03-04: 1200 [IU] via INTRAVENOUS
  Filled 2019-03-04: qty 1200

## 2019-03-04 NOTE — Progress Notes (Addendum)
Patient given Ativan x 1 at 0800 for severe agitation. Lasix given with poor output. Wife changed patients code status to DNR.Endo tool malfunction. Patient maintained on endo tool until d/ced at 1300 but unable to chart Insulin change on endo-tool flow sheet. Remained confused except to wife this pm.

## 2019-03-04 NOTE — Consult Note (Signed)
[place] for Heparin  Indication: atrial fibrillation- new onset  Patient Measurements: Height: 6' (182.9 cm) Weight: 184 lb 15.5 oz (83.9 kg) IBW/kg (Calculated) : 77.6 Heparin Dosing Weight: 86.2 kg   Vital Signs: Temp: 98.3 F (36.8 C) (03/01 0400) Temp Source: Axillary (03/01 0400) BP: 126/71 (03/01 0600) Pulse Rate: 123 (03/01 0600)  Labs: Recent Labs    03/03/19 1330 03/03/19 1547 03/03/19 1547 03/03/19 1750 03/03/19 1750 03/03/19 1936 03/03/19 2359 03/04/19 0526  HGB 13.4  --   --   --   --   --  12.2*  --   HCT 42.9  --   --   --   --   --  36.2*  --   PLT 232  --   --   --   --   --  192  --   APTT  --   --   --  85*  --   --   --   --   LABPROT  --   --   --  14.6  --   --   --   --   INR  --   --   --  1.2  --   --   --   --   HEPARINUNFRC  --   --   --   --   --   --  0.26* 0.31  CREATININE  --  1.60*   < > 1.79*   < > 1.79* 1.98* 1.88*  TROPONINIHS  --  13,356*  --  >27,000*  --   --   --   --    < > = values in this interval not displayed.    Estimated Creatinine Clearance: 44.1 mL/min (A) (by C-G formula based on SCr of 1.88 mg/dL (H)).   Medications:  Called RN to confirm whether pt has been on [facility] prior to admission. Nurse was not able to confirm as she was not in the patient's room. Per chart review, no AC prior to admission and confirmed with pharmacy technician who spoke to patient.   Heparin Course: 02/28 initiation: 1150 units/hr 02/28 2350 HL 0.26: inc to 1300 units/hr 03/01 0530 HL 0.31: no change 03/01 1059 HL 0.24: inc to 1450 units/hr  Assessment: Craig Koch is a 64 y.o. male with a past medical history of anxiety, CAD, diabetes, and hyperlipidemia, hypertension, lung cancer, presents to the emergency department for shortness of breath. Pharmacy was consulted for heparin dosing in a patient with new onset atrial fibrillation. H&H, platelets are stable  Goal of Therapy:  Heparin level 0.3-0.7  units/ml Monitor platelets by anticoagulation protocol: Yes   Plan:   Heparin 1200 units bolus then increase infusion rate to 1450 units/hr  Re-check heparin level 6 hours after rate change  CBC in am  Vallery Sa, PharmD Clinical Pharmacist 03/04/2019,7:15 AM

## 2019-03-04 NOTE — Progress Notes (Signed)
*  PRELIMINARY RESULTS* Echocardiogram 2D Echocardiogram has been performed.  Craig Koch [name] 03/04/2019, 12:02 PM

## 2019-03-04 NOTE — Consult Note (Signed)
[place] for Heparin  Indication: atrial fibrillation- new onset  Allergies  Allergen Reactions  . Lipitor [Atorvastatin Calcium] Other (See Comments)    Aches.  Tolerated crestor.     Patient Measurements: Height: 6' (182.9 cm) Weight: 184 lb 15.5 oz (83.9 kg) IBW/kg (Calculated) : 77.6 Heparin Dosing Weight: 86.2 kg   Vital Signs: Temp: 98.3 F (36.8 C) (03/01 0400) Temp Source: Axillary (03/01 0400) BP: 126/71 (03/01 0600) Pulse Rate: 123 (03/01 0600)  Labs: Recent Labs    03/03/19 1330 03/03/19 1547 03/03/19 1547 03/03/19 1750 03/03/19 1750 03/03/19 1936 03/03/19 2359 03/04/19 0526  HGB 13.4  --   --   --   --   --  12.2*  --   HCT 42.9  --   --   --   --   --  36.2*  --   PLT 232  --   --   --   --   --  192  --   APTT  --   --   --  85*  --   --   --   --   LABPROT  --   --   --  14.6  --   --   --   --   INR  --   --   --  1.2  --   --   --   --   HEPARINUNFRC  --   --   --   --   --   --  0.26* 0.31  CREATININE  --  1.60*   < > 1.79*   < > 1.79* 1.98* 1.88*  TROPONINIHS  --  13,356*  --  >27,000*  --   --   --   --    < > = values in this interval not displayed.    Estimated Creatinine Clearance: 44.1 mL/min (A) (by C-G formula based on SCr of 1.88 mg/dL (H)).   Medications:  Called RN to confirm whether pt has been on [name] prior to admission. Nurse was not able to confirm as she was not in the patient's room. Per chart review, no AC prior to admission and confirmed with pharmacy technician who spoke to patient.   Assessment: Craig Koch is a 64 y.o. male with a past medical history of anxiety, CAD, diabetes, and hyperlipidemia, hypertension, lung cancer, presents to the emergency department for shortness of breath. Pharmacy was consulted for heparin dosing in a patient with new onset atrial fibrillation.   Troponin 13356  Goal of Therapy:  Heparin level 0.3-0.7 units/ml Monitor platelets by anticoagulation  protocol: Yes   Plan:  03/01 @ 0530 HL 0.31 therapeutic. Will continue current rate and will recheck HL at 1100, CBC trending down will continue to monitor.  Craig Koch, PharmD, BCPS Clinical Pharmacist 03/04/2019,6:55 AM

## 2019-03-04 NOTE — Consult Note (Signed)
[place]  [phone] [phone]   Name: WEN MERCED Date: 03/04/2019 MRN: [id number]  DOB: 10/31/1955  Patient Care Team: Tonia Ghent, MD as PCP - General (Family Medicine) Thelma Comp, [place] (Optometry) Telford Nab, RN as Registered Nurse    REASON FOR CONSULTATION: SOMTOCHUKWU WOOLLARD is a 64 y.o. male with multiple medical problems including stage IV adenocarcinoma of the lung with history of left sided malignant pleural effusion requiring Pleurx (later removed).  Patient has opted not to pursue chemotherapy and has instead been treated on immunotherapy.  Patient has had persistent shortness of breath.  CTA of the chest on 11/02/2018 revealed marked increase in tumor encasing the entire left lung with new diffuse increased interstitial thickening concerning for disease progression and lymphangitic spread of disease.    Repeat CT of the chest and abdomen on 01/22/2019 revealed significant interval decrease of tumor burden on the lung.  Patient was admitted to the hospital on 03/03/2019 with shortness of breath and weakness.  He was found to have non-STEMI and DKA.  Patient was referred to palliative care to help address goals and manage ongoing symptoms.  SOCIAL HISTORY:     reports that he has been smoking cigarettes. He has a 9.00 pack-year smoking history. He has quit using smokeless tobacco.  His smokeless tobacco use included snuff. He reports current alcohol use of about 6.0 standard drinks of alcohol per week. He reports that he does not use drugs.   Patient is married.  He lives at home with his wife.  He has a son and daughter who live nearby.  Patient used to work as a Theme park manager.  ADVANCE DIRECTIVES:  Not on file  CODE STATUS: DNR/DNI (MOST form completed on 01/11/2019)  PAST MEDICAL HISTORY: Past Medical History:  Diagnosis Date  . Anxiety   . Arthritis   . Coronary artery disease   . Diabetes  mellitus   . Dyslipidemia   . Hx of CABG   . Hyperlipidemia   . Hypertension   . Malignant neoplasm of unspecified part of unspecified bronchus or lung ([place]) 08/2018   Immunotherapy  . Pleural effusion     PAST SURGICAL HISTORY:  Past Surgical History:  Procedure Laterality Date  . CATARACT EXTRACTION Left 09/2015  . CHEST TUBE INSERTION Left 10/01/2018   Procedure: INSERTION PLEURAL DRAINAGE CATHETER;  Surgeon: [name], MD;  Location: [facility] ORS;  Service: Thoracic;  Laterality: Left;  . CORONARY ARTERY BYPASS GRAFT  2004   (CABG with LIMA to the  LAD, SVG to OM2/OM3, SVG  to diag  . Left ankle surgery     repair of fracture  . Right lower leg surgery     rod    HEMATOLOGY/ONCOLOGY HISTORY:  Oncology History  lung adenocarcinoma  09/08/2018 Initial Diagnosis   lung adenocarcinoma   10/19/2018 -  Chemotherapy   The patient had pembrolizumab (KEYTRUDA) 200 mg in sodium chloride 0.9 % 50 mL chemo infusion, 200 mg, Intravenous, Once, 2 of 8 cycles Administration: 200 mg (10/19/2018), 200 mg (11/09/2018)  for chemotherapy treatment.      ALLERGIES:  is allergic to lipitor [atorvastatin calcium].  MEDICATIONS:  Current Facility-Administered Medications  Medication Dose Route Frequency Provider Last Rate Last Admin  . acetaminophen (TYLENOL) tablet 650 mg  650 mg Oral Q6H PRN Ivor Costa, MD      . albuterol (PROVENTIL) (2.5 MG/3ML) 0.083% nebulizer solution 3 mL  3 mL Inhalation Q4H  PRN Ivor Costa, MD      . amiodarone (NEXTERONE PREMIX) 360-4.14 MG/200ML-% (1.8 mg/mL) IV infusion  30 mg/hr Intravenous Continuous Darel Hong D, NP 16.67 mL/hr at 03/04/19 1220 30 mg/hr at 03/04/19 1220  . aspirin EC tablet 325 mg  325 mg Oral Daily Ivor Costa, MD   325 mg at 03/04/19 1027  . chlorhexidine (PERIDEX) 0.12 % solution 15 mL  15 mL Mouth Rinse BID Darel Hong D, NP   15 mL at 03/04/19 0959  . Chlorhexidine Gluconate Cloth 2 % PADS 6 each  6 each Topical Daily Darel Hong D, NP      . dabrafenib mesylate (TAFINLAR) capsule 150 mg  150 mg Oral BID Ivor Costa, MD      . dextromethorphan-guaiFENesin [facility] DM) 30-600 MG per 12 hr tablet 1 tablet  1 tablet Oral BID Ivor Costa, MD   1 tablet at 03/04/19 1030  . dextrose 5 % in lactated ringers infusion   Intravenous Continuous Ivor Costa, MD 75 mL/hr at 03/04/19 0600 Rate Verify at 03/04/19 0600  . dextrose 50 % solution 0-50 mL  0-50 mL Intravenous PRN Ivor Costa, MD      . diltiazem (CARDIZEM) 125 mg in dextrose 5% 125 mL (1 mg/mL) infusion  5-15 mg/hr Intravenous Titrated Ivor Costa, MD   Stopped at 03/03/19 2026  . diltiazem (CARDIZEM) 125 mg in dextrose 5% 125 mL (1 mg/mL) infusion  5-15 mg/hr Intravenous Titrated Ivor Costa, MD 10 mL/hr at 03/03/19 1815 10 mg/hr at 03/03/19 1815  . dronabinol (MARINOL) capsule 5 mg  5 mg Oral BID AC Ivor Costa, MD   5 mg at 03/04/19 0840  . DULoxetine (CYMBALTA) DR capsule 30 mg  30 mg Oral Daily Ivor Costa, MD   30 mg at 03/04/19 0959  . folic acid (FOLVITE) tablet 1 mg  1 mg Oral Daily Ivor Costa, MD   1 mg at 03/04/19 0959  . furosemide (LASIX) injection 40 mg  40 mg Intravenous Daily Sreenath, Sudheer B, MD   40 mg at 03/04/19 1045  . heparin ADULT infusion 100 units/mL (25000 units/211mL sodium chloride 0.45%)  1,450 Units/hr Intravenous Continuous Dallie Piles, RPH 14.5 mL/hr at 03/04/19 1149 1,450 Units/hr at 03/04/19 1149  . insulin aspart (novoLOG) injection 0-15 Units  0-15 Units Subcutaneous Q4H Sreenath, Sudheer B, MD      . insulin glargine (LANTUS) injection 20 Units  20 Units Subcutaneous Q24H Ralene Muskrat B, MD   20 Units at 03/04/19 1246  . ipratropium-albuterol (DUONEB) 0.5-2.5 (3) MG/3ML nebulizer solution 3 mL  3 mL Nebulization Q4H Sreenath, Sudheer B, MD   3 mL at 03/04/19 1122  . LORazepam (ATIVAN) injection 0-4 mg  0-4 mg Intravenous Q6H Ivor Costa, MD       Followed by  . [START ON 03/05/2019] LORazepam (ATIVAN) injection 0-4 mg  0-4 mg  Intravenous Q12H Ivor Costa, MD      . LORazepam (ATIVAN) tablet 1-4 mg  1-4 mg Oral Q1H PRN Ivor Costa, MD   2 mg at 03/04/19 5329   Or  . LORazepam (ATIVAN) injection 1-4 mg  1-4 mg Intravenous Q1H PRN Ivor Costa, MD   2 mg at 03/03/19 2049  . MEDLINE mouth rinse  15 mL Mouth Rinse q12n4p Darel Hong D, NP   15 mL at 03/04/19 1214  . metoprolol tartrate (LOPRESSOR) tablet 25 mg  25 mg Oral BID Ralene Muskrat B, MD      . multivitamin  with minerals tablet 1 tablet  1 tablet Oral Daily Ivor Costa, MD   1 tablet at 03/04/19 1001  . nicotine (NICODERM CQ - dosed in mg/24 hours) patch 21 mg  21 mg Transdermal Daily Ivor Costa, MD   21 mg at 03/04/19 1021  . ondansetron (ZOFRAN) injection 4 mg  4 mg Intravenous Q8H PRN Ivor Costa, MD      . pneumococcal 23 valent vaccine (PNEUMOVAX-23) injection 0.5 mL  0.5 mL Intramuscular Prior to discharge Ralene Muskrat B, MD      . rosuvastatin (CRESTOR) tablet 10 mg  10 mg Oral Daily Ivor Costa, MD   10 mg at 03/04/19 1035  . thiamine tablet 100 mg  100 mg Oral Daily Ivor Costa, MD   100 mg at 03/04/19 0240   Or  . thiamine (B-1) injection 100 mg  100 mg Intravenous Daily Ivor Costa, MD      . trametinib dimethyl sulfoxide (MEKINIST) tablet 2 mg  2 mg Oral Daily Ivor Costa, MD        VITAL SIGNS: BP 125/72   Pulse (!) 105   Temp 98.1 F (36.7 C)   Resp (!) 35   Ht 6' (1.829 m)   Wt 184 lb 15.5 oz (83.9 kg)   SpO2 91%   BMI 25.09 kg/m  Filed Weights   03/03/19 1328 03/03/19 2000  Weight: 190 lb (86.2 kg) 184 lb 15.5 oz (83.9 kg)    Estimated body mass index is 25.09 kg/m as calculated from the following:   Height as of this encounter: 6' (1.829 m).   Weight as of this encounter: 184 lb 15.5 oz (83.9 kg).  LABS: CBC:    Component Value Date/Time   WBC 8.5 03/04/2019 1059   HGB 12.2 (L) 03/04/2019 1059   HCT 34.2 (L) 03/04/2019 1059   PLT 210 03/04/2019 1059   MCV 77.0 (L) 03/04/2019 1059   NEUTROABS 7.3 03/03/2019 1330    LYMPHSABS 0.8 03/03/2019 1330   MONOABS 0.7 03/03/2019 1330   EOSABS 0.0 03/03/2019 1330   BASOSABS 0.1 03/03/2019 1330   Comprehensive Metabolic Panel:    Component Value Date/Time   NA 127 (L) 03/04/2019 1059   K 3.7 03/04/2019 1059   CL 102 03/04/2019 1059   CO2 14 (L) 03/04/2019 1059   BUN 53 (H) 03/04/2019 1059   CREATININE 2.13 (H) 03/04/2019 1059   GLUCOSE 134 (H) 03/04/2019 1059   CALCIUM 8.0 (L) 03/04/2019 1059   AST 136 (H) 03/03/2019 1750   ALT 25 03/03/2019 1750   ALKPHOS 104 03/03/2019 1750   BILITOT 2.4 (H) 03/03/2019 1750   PROT 7.5 03/03/2019 1750   ALBUMIN 3.2 (L) 03/03/2019 1750    RADIOGRAPHIC STUDIES: DG Chest Portable 1 View  Result Date: 03/03/2019 CLINICAL DATA:  Shortness of breath, since Monday worsening this morning around 1 a.m. EXAM: PORTABLE CHEST 1 VIEW COMPARISON:  12/19/2018 FINDINGS: Cardiomediastinal contours are stable following median sternotomy with persistent left-sided pleural effusion and pleural-parenchymal scarring. No new areas of consolidation or evidence of right-sided pleural effusion. No signs of acute bone process. IMPRESSION: Persistent left-sided pleural effusion and pleural-parenchymal scarring. Electronically Signed   By: Zetta Bills M.D.   On: 03/03/2019 13:59   ECHOCARDIOGRAM COMPLETE  Result Date: 03/04/2019    ECHOCARDIOGRAM REPORT   Patient Name:   QUINTO TIPPY Date of Exam: 03/04/2019 Medical Rec #:  [id number]       Height:       72.0 in  Accession #:    [id number]      Weight:       185.0 lb Date of Birth:  1955-10-10       BSA:          2.061 m Patient Age:    64 years        BP:           125/72 mmHg Patient Gender: M               HR:           119 bpm. Exam Location:  [facility] Procedure: 2D Echo, Color Doppler and Cardiac Doppler Indications:     I21.4 NSTEMI  History:         Patient has prior history of Echocardiogram examinations, most                  recent 08/24/2018. CAD, Prior CABG; Risk Factors:Hypertension,                   Dyslipidemia and Diabetes. Lung Cancer.  Sonographer:     Charmayne Sheer RDCS (AE) Referring Phys:  [place] Phys: Kathlyn Sacramento MD IMPRESSIONS  1. Left ventricular ejection fraction, by estimation, is 30 to 35%. The left ventricle has moderately decreased function. The left ventricle demonstrates global hypokinesis. Left ventricular diastolic parameters are indeterminate.  2. Right ventricular systolic function is normal. The right ventricular size is normal. Tricuspid regurgitation signal is inadequate for assessing PA pressure.  3. Left atrial size was mild to moderately dilated.  4. The mitral valve is normal in structure and function. Moderate mitral valve regurgitation. No evidence of mitral stenosis.  5. The aortic valve is normal in structure and function. Aortic valve regurgitation is trivial. Mild to moderate aortic valve sclerosis/calcification is present, without any evidence of aortic stenosis.  6. The inferior vena cava is normal in size with greater than 50% respiratory variability, suggesting right atrial pressure of 3 mmHg. FINDINGS  Left Ventricle: Left ventricular ejection fraction, by estimation, is 30 to 35%. The left ventricle has moderately decreased function. The left ventricle demonstrates global hypokinesis. The left ventricular internal cavity size was normal in size. There is no left ventricular hypertrophy. Left ventricular diastolic parameters are indeterminate. Right Ventricle: The right ventricular size is normal. No increase in right ventricular wall thickness. Right ventricular systolic function is normal. Tricuspid regurgitation signal is inadequate for assessing PA pressure. Left Atrium: Left atrial size was mild to moderately dilated. Right Atrium: Right atrial size was normal in size. Pericardium: There is no evidence of pericardial effusion. Mitral Valve: The mitral valve is normal in structure and function. Normal mobility of the mitral valve  leaflets. Moderate mitral valve regurgitation. No evidence of mitral valve stenosis. MV peak gradient, 7.5 mmHg. The mean mitral valve gradient is 4.0  mmHg. Tricuspid Valve: The tricuspid valve is normal in structure. Tricuspid valve regurgitation is not demonstrated. No evidence of tricuspid stenosis. Aortic Valve: The aortic valve is normal in structure and function. Aortic valve regurgitation is trivial. Aortic regurgitation PHT measures 281 msec. Mild to moderate aortic valve sclerosis/calcification is present, without any evidence of aortic stenosis. Aortic valve mean gradient measures 5.0 mmHg. Aortic valve peak gradient measures 8.5 mmHg. Aortic valve area, by VTI measures 2.53 cm. Pulmonic Valve: The pulmonic valve was normal in structure. Pulmonic valve regurgitation is not visualized. No evidence of pulmonic stenosis. Aorta: The aortic root is normal in size and  structure. Venous: The inferior vena cava is normal in size with greater than 50% respiratory variability, suggesting right atrial pressure of 3 mmHg. IAS/Shunts: No atrial level shunt detected by color flow Doppler.  LEFT VENTRICLE PLAX 2D LVIDd:         5.21 cm  Diastology LVIDs:         4.21 cm  LV e' lateral:   7.83 cm/s LV PW:         0.84 cm  LV E/e' lateral: 13.4 LV IVS:        0.89 cm  LV e' medial:    7.94 cm/s LVOT diam:     2.40 cm  LV E/e' medial:  13.2 LV SV:         54 LV SV Index:   26 LVOT Area:     4.52 cm  RIGHT VENTRICLE RV Basal diam:  3.08 cm LEFT ATRIUM             Index       RIGHT ATRIUM           Index LA diam:        4.70 cm 2.28 cm/m  RA Area:     14.10 cm LA Vol (A2C):   62.0 ml 30.08 ml/m RA Volume:   35.20 ml  17.08 ml/m LA Vol (A4C):   68.6 ml 33.29 ml/m LA Biplane Vol: 65.3 ml 31.68 ml/m  AORTIC VALVE                    PULMONIC VALVE AV Area (Vmax):    2.95 cm     PV Vmax:       1.10 m/s AV Area (Vmean):   2.86 cm     PV Vmean:      68.400 cm/s AV Area (VTI):     2.53 cm     PV VTI:        0.146 m AV  Vmax:           146.00 cm/s  PV Peak grad:  4.8 mmHg AV Vmean:          103.000 cm/s PV Mean grad:  2.0 mmHg AV VTI:            0.213 m AV Peak Grad:      8.5 mmHg AV Mean Grad:      5.0 mmHg LVOT Vmax:         95.30 cm/s LVOT Vmean:        65.200 cm/s LVOT VTI:          0.119 m LVOT/AV VTI ratio: 0.56 AI PHT:            281 msec  AORTA Ao Root diam: 3.00 cm MITRAL VALVE MV Area (PHT): 3.92 cm     SHUNTS MV Peak grad:  7.5 mmHg     Systemic VTI:  0.12 m MV Mean grad:  4.0 mmHg     Systemic Diam: 2.40 cm MV Vmax:       1.37 m/s MV Vmean:      92.7 cm/s MV Decel Time: 194 msec MV E velocity: 104.80 cm/s Kathlyn Sacramento MD Electronically signed by Kathlyn Sacramento MD Signature Date/Time: 03/04/2019/2:46:43 PM    Final     PERFORMANCE STATUS (ECOG) : 4 - Bedbound  Review of Systems Unable to complete.  Physical Exam General: Ill-appearing Cardiovascular: Tachycardic Pulmonary: Unlabored, on O2 Abdomen: soft, nontender, + bowel sounds GU: no suprapubic tenderness Extremities:  no edema, no joint deformities Skin: no rashes Neurological: Wakes when stimulated, says a few words, confused  IMPRESSION: Patient seen in the ICU.  He is currently confused and unable to engage meaningfully in conversation regarding goals.  I called and spoke with his wife.  Together we reviewed his current medical problems.  She recognizes that he is clinically tenuous.  However, she would like to continue the current scope of treatment for a day or 2 prior to making any treatment decisions.  She would like to give patient the best chance possible of clinical improvement.  She did have some questions about hospice in the event of decline.  I answered his questions to the best my ability.  We did discuss CODE STATUS.  Note that I recently completed a MOST form in the clinic.  At that time, patient was quite clear on his desire not to have CPR or intubation.  He preferred to avoid aggressive measures at end-of-life.  Patient is  currently listed as a limited code.  Wife says that she would want to honor patient's decision in the clinic and asks for him to be a DNR/DNI.  She says that she would not want him intubated nor to have CPR or defibrillation.  PLAN: -Continue current scope of treatment -DNR/DNI -We will follow  Case discussed with Dr. Tasia Catchings, Dr. Patsey Berthold    Time Total: 60 minutes  Visit consisted of counseling and education dealing with the complex and emotionally intense issues of symptom management and palliative care in the setting of serious and potentially life-threatening illness.Greater than 50%  of this time was spent counseling and coordinating care related to the above assessment and plan.  Signed by: Altha Harm, PhD, NP-C

## 2019-03-04 NOTE — Progress Notes (Signed)
[facility] visited pt. per RN referral for code status change; pt. in bed lying down, apparently asleep/not alert; wife tearful @ bedside; wife shared pt. has stage IV lung cancer and is 'struggling to breathe'.  Wife expressed no needs at this time, but is aware of chaplains' availability.       03/04/19 1530  Clinical Encounter Type  Visited With Health care provider;Patient and family together  Visit Type Initial;Social support;Psychological support;Critical Care  Referral From Nurse  Consult/Referral To Chaplain  Stress Factors  Patient Stress Factors Not reviewed  Family Stress Factors Health changes;Loss of control;Major life changes

## 2019-03-04 NOTE — Consult Note (Signed)
[place] for Heparin  Indication: atrial fibrillation- new onset  Allergies  Allergen Reactions  . Lipitor [Atorvastatin Calcium] Other (See Comments)    Aches.  Tolerated crestor.     Patient Measurements: Height: 6' (182.9 cm) Weight: 184 lb 15.5 oz (83.9 kg) IBW/kg (Calculated) : 77.6 Heparin Dosing Weight: 86.2 kg   Vital Signs: Temp: 98.2 F (36.8 C) (03/01 0000) Temp Source: Axillary (03/01 0000) BP: 120/68 (03/01 0000) Pulse Rate: 111 (03/01 0000)  Labs: Recent Labs    03/03/19 1330 03/03/19 1547 03/03/19 1547 03/03/19 1750 03/03/19 1936 03/03/19 2359  HGB 13.4  --   --   --   --  12.2*  HCT 42.9  --   --   --   --  36.2*  PLT 232  --   --   --   --  192  APTT  --   --   --  85*  --   --   LABPROT  --   --   --  14.6  --   --   INR  --   --   --  1.2  --   --   HEPARINUNFRC  --   --   --   --   --  0.26*  CREATININE  --  1.60*   < > 1.79* 1.79* 1.98*  TROPONINIHS  --  13,356*  --  >27,000*  --   --    < > = values in this interval not displayed.    Estimated Creatinine Clearance: 41.9 mL/min (A) (by C-G formula based on SCr of 1.98 mg/dL (H)).   Medications:  Called RN to confirm whether pt has been on [facility] prior to admission. Nurse was not able to confirm as she was not in the patient's room. Per chart review, no AC prior to admission and confirmed with pharmacy technician who spoke to patient.   Assessment: Craig Koch is a 64 y.o. male with a past medical history of anxiety, CAD, diabetes, and hyperlipidemia, hypertension, lung cancer, presents to the emergency department for shortness of breath. Pharmacy was consulted for heparin dosing in a patient with new onset atrial fibrillation.   Troponin 13356  Goal of Therapy:  Heparin level 0.3-0.7 units/ml Monitor platelets by anticoagulation protocol: Yes   Plan:  02/28 @ 2350 HL 0.26 subtherapeutic. Will rebolus heparin 1200 units IV x 1 and increase rate to  1300 units/hr and will recheck HL at 0700, CBC trending down will continue to monitor.  Tobie Lords, PharmD, BCPS Clinical Pharmacist 03/04/2019,1:00 AM

## 2019-03-04 NOTE — Progress Notes (Signed)
PROGRESS NOTE    Craig Koch  [facility]:[id number] DOB: 1955-05-25 DOA: 03/03/2019 PCP: Tonia Ghent, MD   Brief Narrative:  HPI: Craig Koch is a 64 y.o. male with medical history significant of hypertension, hyperlipidemia, diabetes mellitus, depression, anxiety, lung cancer, CAD, CABG, tobacco abuse, alcohol abuse, left pleural effusion, who presents with shortness of breath and generalized weakness  Patient states that he has been having shortness of breath, dry cough and generalized weakness the past several days, which has worsened today.  Denies chest pain, fever or chills.  He states that he normally has O2 saturation at about the 90s.  Patient is using accessory muscle for breathing.  Denies nausea, no vomiting, diarrhea, abdominal pain.  Denies symptoms of UTI or unilateral weakness.  No recent fall.  No rectal bleeding or dark stool.  3/1: Patient seen and examined.  Very hard of hearing but mumbling to himself well rather incoherently.  Is able to tell me his name on repeat prompting although very difficult to understand.  DKA resolving.  Heart rate improved after initiation of amiodarone infusion per cardiology recommendations.  Weaned off BiPAP to nasal cannula.  Still tachypneic and encephalopathic.  Appears in mild distress.   Assessment & Plan:   Principal Problem:   Atrial fibrillation with RVR (HCC) Active Problems:   HLD (hyperlipidemia)   Essential hypertension   History of alcohol use   CAD (coronary artery disease), native coronary artery   lung adenocarcinoma   Tobacco abuse   Uncontrolled type 2 diabetes mellitus with hyperglycemia (HCC)   DKA (diabetic ketoacidoses) (HCC)   Hypothermia   SOB (shortness of breath)   AKI (acute kidney injury) ([place])   NSTEMI (non-ST elevated myocardial infarction) ([place])   Acute alteration in mental status  New onset atrial fibrillation with RVR ([place]) Patient does not have history of atrial fibrillation.   Today  patient developed atrial fibrillation with RVR, heart rate up to 140s.   Currently hemodynamically stable.  Denies chest pain. Cardiology consulted from admission Patient was initially on Cardizem infusion Switched to amiodarone per cardiology recommendations Started on IV heparin for anticoagulation Plan: Continue amiodarone infusion Continue home metoprolol Follow-up 2D echocardiogram Continue IV heparin Follow-up any further cardiology recommendations  Acute NSTEMI Troponins markedly elevated NSTEMI in the setting of respiratory failure and DKA On heparin infusion On metoprolol Cardiology recommending ischemic evaluation during this admission Currently on hold pending resolution of acute issues  DKA (diabetic ketoacidoses): Severe DKA with elevated blood glucose and pH less than 6.9 Has corrected with insulin GTT Anion gap closed now Start transition to subcutaneous insulin regimen 20 units of Lantus NovoLog 0 to 15 units every 4 hours Carb controlled diet when able to tolerate p.o. Admitted coordinator following, recommendations appreciated  Acute encephalopathy Etiology not entirely clear Could potentially be infectious though no clear infectious focus identified To be secondary to hypoxia though improved since admission Could be metabolic secondary to severe diabetic ketoacidosis though mental status does not appear to be improving despite correction of acidosis Could be secondary to worsening brain metastases Could also be element of withdrawal.  Patient supposedly quit drinking over 3 months ago however clinical presentation could be consistent with acute withdrawal Plan: Check procalcitonin.  If clearly positive will start empiric antibiotics Monitor patient, frequent reorientation Correct hypoxia and acidosis and reevaluate Patient will need brain MRI later during hospitalization once acute issues improved  SOB (shortness of breath) Etiology is not clear.     Patient  has elevated BNP 330, but no history of congestive heart failure.   2D echo 08/24/2018 showed EF 55-60%.  Clinically patient is dry.  Denies chest pain.   Low suspicions for PE, though cannot rule out this possibility completely.   Most likely explanation is due to to DKA and atrial fibrillation Plan: -Treat patient with bronchodilators. -Observe closely.  If patient does not improve with control for DKA and atrial fibrillation, may consider to do PE work-up. -He is already on heparin GTT in the setting of possible PE  AKI:  Etiology not entirely clear Initially working diagnosis was intravascular volume depletion secondary to diarrhea ketoacidosis Despite aggressive fluid resuscitation kidney function has worsened over interval Kidney function was normal as of 02/08/2019 Plan: Check UA Urine electrolytes Renal ultrasound Nephrology consulted, messaged Dr. Candiss Norse.  Recommendations appreciated   Lung adenocarcinoma stage IV. Following with oncologist Dr.Yu on Dabrafenib Oncology consulted, recommendations appreciated Per oncologist patient responded well to the last round of chemotherapy so clinical deterioration is surprising Palliative care also following patient, I have consulted their service  HLD (hyperlipidemia):  -Crestor  CAD (coronary artery disease), native coronary artery:  No CP -on Crestor  Tobacco abuse and Alcohol abuse: -Nicotine patch -CIWA protocol   Uncontrolled type 2 diabetes mellitus with hyperglycemia: Most recent A1c 10.6, poorly controled. Patient is taking Metformin, Humalog, Levemir at home -Currently on DKA protocol  Hypothermia, resolved -Bair hugger prn   DVT prophylaxis: Heparin GTT Code Status: Partial,  No intubation Family Communication: None today Disposition Plan: Unclear at this time, pending improvement in severe medical issues  Consultants:   ICU  Cardiology  Oncology  Palliative  care  Nephrology  Procedures:  BiPAP  Antimicrobials:   none   Subjective: Patient examined Weaned off BiPAP now on nasal cannula Still tachypneic Still encephalopathic DKA resolved  Objective: Vitals:   03/04/19 1000 03/04/19 1022 03/04/19 1100 03/04/19 1122  BP: 121/70 112/71 125/72   Pulse: (!) 116 (!) 118 (!) 116 (!) 105  Resp: (!) 30  (!) 42 (!) 35  Temp:      TempSrc:      SpO2: (!) 89%  90% 91%  Weight:      Height:        Intake/Output Summary (Last 24 hours) at 03/04/2019 1430 Last data filed at 03/04/2019 0600 Gross per 24 hour  Intake 2117.67 ml  Output --  Net 2117.67 ml   Filed Weights   03/03/19 1328 03/03/19 2000  Weight: 86.2 kg 83.9 kg    Examination:  General exam: Appears in mild respiratory distress.  Appears chronically ill Respiratory system: Scattered crackles bilaterally.  Tachypneic Cardiovascular system: S1-S2 heard.  Irregular rhythm.  No appreciable murmurs gastrointestinal system: Abdomen is nondistended, soft and nontender. No organomegaly or masses felt. Normal bowel sounds heard. Central nervous system: Encephalopathic, no obvious focal deficits Extremities: Symmetric 5 x 5 power. Skin: No rashes, lesions or ulcers Psychiatry: Encephalopathic    Data Reviewed: I have personally reviewed following labs and imaging studies  CBC: Recent Labs  Lab 03/03/19 1330 03/03/19 2359 03/04/19 1059  WBC 9.4 9.9 8.5  NEUTROABS 7.3  --   --   HGB 13.4 12.2* 12.2*  HCT 42.9 36.2* 34.2*  MCV 87.4 82.1 77.0*  PLT 232 192 185   Basic Metabolic Panel: Recent Labs  Lab 03/03/19 1750 03/03/19 1936 03/03/19 2359 03/04/19 0526 03/04/19 1059  NA 123* 124* 127* 127* 127*  K 5.4* 4.9 3.5 3.5 3.7  CL  89* 92* 96* 99 102  CO2 8* 8* 13* 14* 14*  GLUCOSE 756* 682* 440* 177* 134*  BUN 43* 45* 48* 53* 53*  CREATININE 1.79* 1.79* 1.98* 1.88* 2.13*  CALCIUM 8.2* 8.2* 8.0* 8.1* 8.0*  MG 2.3  --   --   --   --    GFR: Estimated  Creatinine Clearance: 39 mL/min (A) (by C-G formula based on SCr of 2.13 mg/dL (H)). Liver Function Tests: Recent Labs  Lab 03/03/19 1547 03/03/19 1750  AST 81* 136*  ALT 18 25  ALKPHOS 80 104  BILITOT 2.3* 2.4*  PROT 6.6 7.5  ALBUMIN 2.9* 3.2*   No results for input(s): LIPASE, AMYLASE in the last 168 hours. No results for input(s): AMMONIA in the last 168 hours. Coagulation Profile: Recent Labs  Lab 03/03/19 1750  INR 1.2   Cardiac Enzymes: No results for input(s): CKTOTAL, CKMB, CKMBINDEX, TROPONINI in the last 168 hours. BNP (last 3 results) No results for input(s): PROBNP in the last 8760 hours. HbA1C: No results for input(s): HGBA1C in the last 72 hours. CBG: Recent Labs  Lab 03/04/19 0919 03/04/19 0957 03/04/19 1055 03/04/19 1143 03/04/19 1255  GLUCAP 159* 160* 157* 117* 170*   Lipid Profile: Recent Labs    03/04/19 1059  CHOL 138  HDL 15*  LDLCALC 81  TRIG 209*  CHOLHDL 9.2   Thyroid Function Tests: Recent Labs    03/04/19 0526  TSH 3.184   Anemia Panel: No results for input(s): VITAMINB12, FOLATE, FERRITIN, TIBC, IRON, RETICCTPCT in the last 72 hours. Sepsis Labs: No results for input(s): PROCALCITON, LATICACIDVEN in the last 168 hours.  Recent Results (from the past 240 hour(s))  Respiratory Panel by RT PCR (Flu A&B, Covid) - Nasopharyngeal Swab     Status: None   Collection Time: 03/03/19  3:31 PM   Specimen: Nasopharyngeal Swab  Result Value Ref Range Status   SARS Coronavirus 2 by RT PCR NEGATIVE NEGATIVE Final    Comment: (NOTE) SARS-CoV-2 target nucleic acids are NOT DETECTED. The SARS-CoV-2 RNA is generally detectable in upper respiratoy specimens during the acute phase of infection. The lowest concentration of SARS-CoV-2 viral copies this assay can detect is 131 copies/mL. A negative result does not preclude SARS-Cov-2 infection and should not be used as the sole basis for treatment or other patient management decisions. A  negative result may occur with  improper specimen collection/handling, submission of specimen other than nasopharyngeal swab, presence of viral mutation(s) within the areas targeted by this assay, and inadequate number of viral copies (<131 copies/mL). A negative result must be combined with clinical observations, patient history, and epidemiological information. The expected result is Negative. Fact Sheet for Patients:  PinkCheek.be Fact Sheet for Healthcare Providers:  GravelBags.it This test is not yet ap proved or cleared by the [place] FDA and  has been authorized for detection and/or diagnosis of SARS-CoV-2 by FDA under an Emergency Use Authorization (EUA). This EUA will remain  in effect (meaning this test can be used) for the duration of the COVID-19 declaration under Section 564(b)(1) of the Act, 21 U.S.C. section 360bbb-3(b)(1), unless the authorization is terminated or revoked sooner.    Influenza A by PCR NEGATIVE NEGATIVE Final   Influenza B by PCR NEGATIVE NEGATIVE Final    Comment: (NOTE) The Xpert Xpress SARS-CoV-2/FLU/RSV assay is intended as an aid in  the diagnosis of influenza from Nasopharyngeal swab specimens and  should not be used as a sole basis for treatment. Nasal  washings and  aspirates are unacceptable for Xpert Xpress SARS-CoV-2/FLU/RSV  testing. Fact Sheet for Patients: PinkCheek.be Fact Sheet for Healthcare Providers: GravelBags.it This test is not yet approved or cleared by the [place] FDA and  has been authorized for detection and/or diagnosis of SARS-CoV-2 by  FDA under an Emergency Use Authorization (EUA). This EUA will remain  in effect (meaning this test can be used) for the duration of the  Covid-19 declaration under Section 564(b)(1) of the Act, 21  U.S.C. section 360bbb-3(b)(1), unless the authorization is   terminated or revoked. Performed at [facility], [address] [place], [place] [address]   MRSA PCR Screening     Status: None   Collection Time: 03/03/19  7:48 PM   Specimen: Nasopharyngeal  Result Value Ref Range Status   MRSA by PCR NEGATIVE NEGATIVE Final    Comment:        The GeneXpert MRSA Assay (FDA approved for NASAL specimens only), is one component of a comprehensive MRSA colonization surveillance program. It is not intended to diagnose MRSA infection nor to guide or monitor treatment for MRSA infections. Performed at [facility], [address], [place], [place] [address]          Radiology Studies: DG Chest Portable 1 View  Result Date: 03/03/2019 CLINICAL DATA:  Shortness of breath, since Monday worsening this morning around 1 a.m. EXAM: PORTABLE CHEST 1 VIEW COMPARISON:  12/19/2018 FINDINGS: Cardiomediastinal contours are stable following median sternotomy with persistent left-sided pleural effusion and pleural-parenchymal scarring. No new areas of consolidation or evidence of right-sided pleural effusion. No signs of acute bone process. IMPRESSION: Persistent left-sided pleural effusion and pleural-parenchymal scarring. Electronically Signed   By: Zetta Bills M.D.   On: 03/03/2019 13:59        Scheduled Meds: . aspirin EC  325 mg Oral Daily  . chlorhexidine  15 mL Mouth Rinse BID  . Chlorhexidine Gluconate Cloth  6 each Topical Daily  . dabrafenib mesylate  150 mg Oral BID  . dextromethorphan-guaiFENesin  1 tablet Oral BID  . dronabinol  5 mg Oral BID AC  . DULoxetine  30 mg Oral Daily  . folic acid  1 mg Oral Daily  . furosemide  40 mg Intravenous Daily  . insulin aspart  0-15 Units Subcutaneous Q4H  . insulin glargine  20 Units Subcutaneous Q24H  . ipratropium-albuterol  3 mL Nebulization Q4H  . LORazepam  0-4 mg Intravenous Q6H   Followed by  . [START ON 03/05/2019] LORazepam  0-4 mg Intravenous Q12H  . mouth rinse   15 mL Mouth Rinse q12n4p  . metoprolol tartrate  25 mg Oral BID  . multivitamin with minerals  1 tablet Oral Daily  . nicotine  21 mg Transdermal Daily  . rosuvastatin  10 mg Oral Daily  . thiamine  100 mg Oral Daily   Or  . thiamine  100 mg Intravenous Daily  . trametinib dimethyl sulfoxide  2 mg Oral Daily   Continuous Infusions: . amiodarone 30 mg/hr (03/04/19 1220)  . dextrose 5% lactated ringers 75 mL/hr at 03/04/19 0600  . diltiazem (CARDIZEM) infusion Stopped (03/03/19 2026)  . diltiazem (CARDIZEM) infusion 10 mg/hr (03/03/19 1815)  . heparin 1,450 Units/hr (03/04/19 1149)     LOS: 1 day    Time spent: 35 minutes    Craig Ace, MD [facility] Pager [phone]  If 7PM-7AM, please contact night-coverage 03/04/2019, 2:30 PM

## 2019-03-04 NOTE — Progress Notes (Addendum)
Inpatient Diabetes Program Recommendations  AACE/ADA: New Consensus Statement on Inpatient Glycemic Control   Target Ranges:  Prepandial:   less than 140 mg/dL      Peak postprandial:   less than 180 mg/dL (1-2 hours)      Critically ill patients:  140 - 180 mg/dL   Results for RIKI, GEHRING (MRN [id number]) as of 03/04/2019 07:48  Ref. Range 03/04/2019 00:36 03/04/2019 01:07 03/04/2019 02:07 03/04/2019 03:10 03/04/2019 04:23 03/04/2019 05:25 03/04/2019 06:24 03/04/2019 07:12  Glucose-Capillary Latest Ref Range: 70 - 99 mg/dL 443 (H) 379 (H) 331 (H) 278 (H) 202 (H) 184 (H) 158 (H) 169 (H)   Results for KEI, MCELHINEY (MRN [id number]) as of 03/04/2019 07:48  Ref. Range 03/03/2019 15:47 03/03/2019 17:50  Beta-Hydroxybutyric Acid Latest Ref Range: 0.05 - 0.27 mmol/L >8.00 (H) >8.00 (H)  Glucose Latest Ref Range: 70 - 99 mg/dL 718 (HH) [facility])   Results for COURTNEY, FENLON (MRN [id number]) as of 03/04/2019 07:48  Ref. Range 02/14/2019 09:15  Hemoglobin A1C Latest Ref Range: 4.0 - 5.6 % 10.6 (A)   Review of Glycemic Control  Diabetes history: DM2 Outpatient Diabetes medications: Levemir 30 QAM, Humalog 20 units TID with meals (notes he skips Humalog sometimes), Metformin 500 mg BID Current orders for Inpatient glycemic control: IV insulin per DKA  Inpatient Diabetes Program Recommendations:   Insulin at Transition: Once acidosis is cleared as determined by MD and ready to transition from IV to SQ insulin, please consider ordering Levemir 20 units Q24H, CBGs Q4H, and Novolog 0-15 units Q4H.  A1C: Noted A1C 10.6% on 02/14/19 indicating an average glucose of 258 mg/dl over the past 2-3 months.   NOTE: In reviewing the chart, noted patient seen PCP on 02/14/19 and per office note, patient was noted to be having persistent hyperglycemia likely related to cancer treatments.  Patient was instructed to take Levemir 40 units QHS and increase by 1 unit every morning if glucose was greater than 150 mg/dl and if less than  100 mg/dl then decrease by 1 unit. Patient was admitted on 03/03/19 with DKA with initial glucose of 718 mg/dl and was started on IV insulin. Patient is still currently still ordered IV insulin and labs still indicate acidosis at this time. Made recommendations above for when MD is ready to transition from IV to SQ insulin.   Addendum 03/04/19@13 :46- Spoke with patient regarding DM control and outpatient regimen. Patient kept eyes closed during conversation, hard of hearing, slow to respond to questions, and speech difficult to understand at times. Patient states that his PCP is assisting with DM management and he notes that he is taking Lantus 30 units QAM consistently but notes that he skips the Humalog sometimes (not able to provide specifically when he skips the Humalog). Patient is aware of current A1C and is working with PCP to improve DM control. Explained that MD has provided transition orders from IV to SQ insulin. Patient verbalized understanding of information and states he has no questions at this time. Will continue to follow along.   Thanks, Barnie Alderman, RN, MSN, CDE Diabetes Coordinator Inpatient Diabetes Program [phone] (Team Pager from 8am to 5pm)

## 2019-03-04 NOTE — Consult Note (Signed)
Hematology/Oncology Consult note [facility] [phone] [phone]  Patient Care Team: Tonia Ghent, MD as PCP - General (Family Medicine) Thelma Comp, [place] (Optometry) Telford Nab, RN as Registered Nurse   Name of the patient: Craig Koch  [id number]  September 27, 1955   Date of visit: 03/04/19 REASON FOR COSULTATION:  Stage IV lung CA History of presenting illness-  64 y.o. male with PMH listed at below who presents to ER due to shortness of breath.  Currently patient is confused not able to provide any history. Medical history was obtained from reviewing chart, discussing with physicians, and I called his wife and discussed with her to. Apparently patient has been feeling sick for the past 1 week.  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  Patient's wife wanted to contact cancer center for advice and the patient asked her to wait and did not want to seek medical advice at that time. Patient started to experience difficulty in breathing last evening.  Wife called answering service of cancer center and was advised by on-call physician to go to emergency room.  There was no report history of fever, chills, chest pain or abdominal pain.  No diarrhea. In the emergency room, patient was found to be tachycardic, rate in 130s bpm, rhythm.  To be A. fib.  His saturation was 80s to 90s on room air and was placed on nasal cannula oxygen by EMS. Initial blood work showed pH less than 6.9, fingerstick blood glucose greater than 600, consistent with DKA.  Covid testing was negative.  Patient was admitted to stepdown unit for DKA treatments.  Patient was started on insulin drip.  Overnight, patient has declined mentation, more breathing difficulties, was transferred to ICU and started on BiPAP. Is currently weaned off BiPAP and is breathing via nasal cannula oxygen.  Further work-up showed elevated  troponin, likely NSTEMI from demand ischemia.  AKI, creatinine on admission 1.6, trending up progressively to 2.13 today. Patient was having 2D echo done at the bedside when I stop by to see him.  He is confused and not able to provide history.  Review of Systems  Unable to perform ROS: Mental status change    Allergies  Allergen Reactions  . Lipitor [Atorvastatin Calcium] Other (See Comments)    Aches.  Tolerated crestor.     Patient Active Problem List   Diagnosis Date Noted  . Atrial fibrillation with RVR ([place]) 03/03/2019  . DKA (diabetic ketoacidoses) ([place]) 03/03/2019  . Diabetes mellitus without complication ([place]) 52/08/221  . Hypothermia 03/03/2019  . SOB (shortness of breath) 03/03/2019  . AKI (acute kidney injury) ([place]) 03/03/2019  . NSTEMI (non-ST elevated myocardial infarction) ([place]) 03/03/2019  . Hyponatremia 02/08/2019  . Uncontrolled type 2 diabetes mellitus with hyperglycemia ([place]) 02/08/2019  . Leg swelling 02/08/2019  . Paronychia, finger 12/19/2018  . Encounter for antineoplastic chemotherapy 12/08/2018  . Tobacco abuse 10/12/2018  . Malignant pleural effusion   . Administrative encounter 09/23/2018  . lung adenocarcinoma 09/08/2018  . Goals of care, counseling/discussion 09/08/2018  . Recurrent pleural effusion on left 09/02/2018  . Elevated troponin 08/24/2018  . Medicare annual wellness visit, subsequent 08/07/2018  . Lower urinary tract symptoms (LUTS) 08/07/2018  . Neck pain 08/07/2018  . CAD (coronary artery disease), native coronary artery 09/23/2017  . Smoker 09/23/2017  . Chronic joint pain 10/18/2016  . Healthcare maintenance 07/17/2016  . Advance care planning 07/17/2016  .  Fungal rash of torso 05/06/2016  . Trigger finger 05/06/2016  . History of alcohol use 11/17/2015  . Skin lesion 05/19/2015  . Anxiety state 02/19/2015  . Hand weakness 05/23/2011  . Diabetes mellitus with complication ([place]) 95/62/1308  . HLD (hyperlipidemia) 03/05/2010   . Essential hypertension 03/05/2010  . Coronary atherosclerosis 03/05/2010     Past Medical History:  Diagnosis Date  . Anxiety   . Arthritis   . Coronary artery disease   . Diabetes mellitus   . Dyslipidemia   . Hx of CABG   . Hyperlipidemia   . Hypertension   . Malignant neoplasm of unspecified part of unspecified bronchus or lung ([place]) 08/2018   Immunotherapy  . Pleural effusion      Past Surgical History:  Procedure Laterality Date  . CATARACT EXTRACTION Left 09/2015  . CHEST TUBE INSERTION Left 10/01/2018   Procedure: INSERTION PLEURAL DRAINAGE CATHETER;  Surgeon: [name], MD;  Location: [facility] ORS;  Service: Thoracic;  Laterality: Left;  . CORONARY ARTERY BYPASS GRAFT  2004   (CABG with LIMA to the  LAD, SVG to OM2/OM3, SVG  to diag  . Left ankle surgery     repair of fracture  . Right lower leg surgery     rod    Social History   Socioeconomic History  . Marital status: Married    Spouse name: Not on file  . Number of children: Not on file  . Years of education: Not on file  . Highest education level: Not on file  Occupational History  . Not on file  Tobacco Use  . Smoking status: Current Every Day Smoker    Packs/day: 0.20    Years: 45.00    Pack years: 9.00    Types: Cigarettes  . Smokeless tobacco: Former Systems developer    Types: Snuff  Substance and Sexual Activity  . Alcohol use: Yes    Alcohol/week: 6.0 standard drinks    Types: 6 Cans of beer per week    Comment: occ, average 6 pack in a week  . Drug use: No  . Sexual activity: Never  Other Topics Concern  . Not on file  Social History Narrative   On disability 2009 after prev injuries and CAD.     Married 1976   2 kids, 4 grandkids.    Social Determinants of Health   Financial Resource Strain:   . Difficulty of Paying Living Expenses: Not on file  Food Insecurity:   . Worried About Charity fundraiser in the Last Year: Not on file  . Ran Out of Food in the Last Year: Not on file    Transportation Needs:   . Lack of Transportation (Medical): Not on file  . Lack of Transportation (Non-Medical): Not on file  Physical Activity:   . Days of Exercise per Week: Not on file  . Minutes of Exercise per Session: Not on file  Stress:   . Feeling of Stress : Not on file  Social Connections:   . Frequency of Communication with Friends and Family: Not on file  . Frequency of Social Gatherings with Friends and Family: Not on file  . Attends Religious Services: Not on file  . Active Member of Clubs or Organizations: Not on file  . Attends Archivist Meetings: Not on file  . Marital Status: Not on file  Intimate Partner Violence:   . Fear of Current or Ex-Partner: Not on file  . Emotionally Abused: Not on file  .  Physically Abused: Not on file  . Sexually Abused: Not on file     Family History  Problem Relation Age of Onset  . Dementia Mother   . Heart disease Father   . Colon cancer Neg Hx   . Prostate cancer Neg Hx   . Diabetes Neg Hx      Current Facility-Administered Medications:  .  acetaminophen (TYLENOL) tablet 650 mg, 650 mg, Oral, Q6H PRN, Ivor Costa, MD .  albuterol (PROVENTIL) (2.5 MG/3ML) 0.083% nebulizer solution 3 mL, 3 mL, Inhalation, Q4H PRN, Ivor Costa, MD .  amiodarone (NEXTERONE PREMIX) 360-4.14 MG/200ML-% (1.8 mg/mL) IV infusion, 30 mg/hr, Intravenous, Continuous, Darel Hong D, NP, Last Rate: 16.67 mL/hr at 03/04/19 1220, 30 mg/hr at 03/04/19 1220 .  aspirin EC tablet 325 mg, 325 mg, Oral, Daily, Ivor Costa, MD, 325 mg at 03/04/19 1027 .  chlorhexidine (PERIDEX) 0.12 % solution 15 mL, 15 mL, Mouth Rinse, BID, Darel Hong D, NP, 15 mL at 03/04/19 0959 .  Chlorhexidine Gluconate Cloth 2 % PADS 6 each, 6 each, Topical, Daily, Darel Hong D, NP .  dabrafenib mesylate (TAFINLAR) capsule 150 mg, 150 mg, Oral, BID, Ivor Costa, MD .  dextromethorphan-guaiFENesin [facility] DM) 30-600 MG per 12 hr tablet 1 tablet, 1 tablet, Oral, BID,  Ivor Costa, MD, 1 tablet at 03/04/19 1030 .  dextrose 5 % in lactated ringers infusion, , Intravenous, Continuous, Ivor Costa, MD, Last Rate: 75 mL/hr at 03/04/19 0600, Rate Verify at 03/04/19 0600 .  dextrose 50 % solution 0-50 mL, 0-50 mL, Intravenous, PRN, Ivor Costa, MD .  diltiazem (CARDIZEM) 125 mg in dextrose 5% 125 mL (1 mg/mL) infusion, 5-15 mg/hr, Intravenous, Titrated, Ivor Costa, MD, Stopped at 03/03/19 2026 .  diltiazem (CARDIZEM) 125 mg in dextrose 5% 125 mL (1 mg/mL) infusion, 5-15 mg/hr, Intravenous, Titrated, Ivor Costa, MD, Last Rate: 10 mL/hr at 03/03/19 1815, 10 mg/hr at 03/03/19 1815 .  dronabinol (MARINOL) capsule 5 mg, 5 mg, Oral, BID AC, Ivor Costa, MD, 5 mg at 03/04/19 0840 .  DULoxetine (CYMBALTA) DR capsule 30 mg, 30 mg, Oral, Daily, Ivor Costa, MD, 30 mg at 03/04/19 0959 .  folic acid (FOLVITE) tablet 1 mg, 1 mg, Oral, Daily, Ivor Costa, MD, 1 mg at 03/04/19 0959 .  furosemide (LASIX) injection 40 mg, 40 mg, Intravenous, Daily, Sreenath, Sudheer B, MD, 40 mg at 03/04/19 1045 .  heparin ADULT infusion 100 units/mL (25000 units/243m sodium chloride 0.45%), 1,450 Units/hr, Intravenous, Continuous, GDallie Piles RPH, Last Rate: 14.5 mL/hr at 03/04/19 1149, 1,450 Units/hr at 03/04/19 1149 .  insulin aspart (novoLOG) injection 0-15 Units, 0-15 Units, Subcutaneous, Q4H, Sreenath, Sudheer B, MD .  insulin glargine (LANTUS) injection 20 Units, 20 Units, Subcutaneous, Q24H, Sreenath, Sudheer B, MD .  insulin regular, human (MYXREDLIN) 100 units/ 100 mL infusion, , Intravenous, Continuous, NIvor Costa MD, Last Rate: 2.2 mL/hr at 03/04/19 0745, 2.2 Units/hr at 03/04/19 0745 .  ipratropium-albuterol (DUONEB) 0.5-2.5 (3) MG/3ML nebulizer solution 3 mL, 3 mL, Nebulization, Q4H, Sreenath, Sudheer B, MD, 3 mL at 03/04/19 1122 .  LORazepam (ATIVAN) injection 0-4 mg, 0-4 mg, Intravenous, Q6H **FOLLOWED BY** [START ON 03/05/2019] LORazepam (ATIVAN) injection 0-4 mg, 0-4 mg, Intravenous, Q12H,  NIvor Costa MD .  LORazepam (ATIVAN) tablet 1-4 mg, 1-4 mg, Oral, Q1H PRN, 2 mg at 03/04/19 0834 **OR** LORazepam (ATIVAN) injection 1-4 mg, 1-4 mg, Intravenous, Q1H PRN, NIvor Costa MD, 2 mg at 03/03/19 2049 .  MEDLINE mouth rinse, 15 mL, Mouth Rinse,  Naomie Dean D, NP, 15 mL at 03/04/19 1214 .  metoprolol tartrate (LOPRESSOR) tablet 25 mg, 25 mg, Oral, BID, Sreenath, Sudheer B, MD .  multivitamin with minerals tablet 1 tablet, 1 tablet, Oral, Daily, Ivor Costa, MD, 1 tablet at 03/04/19 1001 .  nicotine (NICODERM CQ - dosed in mg/24 hours) patch 21 mg, 21 mg, Transdermal, Daily, Ivor Costa, MD, 21 mg at 03/04/19 1021 .  ondansetron (ZOFRAN) injection 4 mg, 4 mg, Intravenous, Q8H PRN, Ivor Costa, MD .  pneumococcal 23 valent vaccine (PNEUMOVAX-23) injection 0.5 mL, 0.5 mL, Intramuscular, Prior to discharge, Sreenath, Sudheer B, MD .  potassium chloride 10 mEq in 100 mL IVPB, 10 mEq, Intravenous, Q1 Hr x 4, Sreenath, Sudheer B, MD, Last Rate: 100 mL/hr at 03/04/19 1152, 10 mEq at 03/04/19 1152 .  rosuvastatin (CRESTOR) tablet 10 mg, 10 mg, Oral, Daily, Ivor Costa, MD, 10 mg at 03/04/19 1035 .  thiamine tablet 100 mg, 100 mg, Oral, Daily, 100 mg at 03/04/19 0959 **OR** thiamine (B-1) injection 100 mg, 100 mg, Intravenous, Daily, Ivor Costa, MD .  trametinib dimethyl sulfoxide (MEKINIST) tablet 2 mg, 2 mg, Oral, Daily, Ivor Costa, MD   Physical exam:  Vitals:   03/04/19 1000 03/04/19 1022 03/04/19 1100 03/04/19 1122  BP: 121/70 112/71 125/72   Pulse: (!) 116 (!) 118 (!) 116 (!) 105  Resp: (!) 30  (!) 42 (!) 35  Temp:      TempSrc:      SpO2: (!) 89%  90% 91%  Weight:      Height:       Physical Exam  Cardiovascular:  Tachycardia  Pulmonary/Chest:  Breathing via nasal cannula oxygen.  Tachypneic  Abdominal: Soft.  Musculoskeletal:        General: No edema.     Cervical back: Normal range of motion.  Neurological:  Confused, lethargic  Skin: Skin is warm.        CMP  Latest Ref Rng & Units 03/04/2019  Glucose 70 - 99 mg/dL 134(H)  BUN 8 - 23 mg/dL 53(H)  Creatinine 0.61 - 1.24 mg/dL 2.13(H)  Sodium 135 - 145 mmol/L 127(L)  Potassium 3.5 - 5.1 mmol/L 3.7  Chloride 98 - 111 mmol/L 102  CO2 22 - 32 mmol/L 14(L)  Calcium 8.9 - 10.3 mg/dL 8.0(L)  Total Protein 6.5 - 8.1 g/dL -  Total Bilirubin 0.3 - 1.2 mg/dL -  Alkaline Phos 38 - 126 U/L -  AST 15 - 41 U/L -  ALT 0 - 44 U/L -   CBC Latest Ref Rng & Units 03/04/2019  WBC 4.0 - 10.5 K/uL 8.5  Hemoglobin 13.0 - 17.0 g/dL 12.2(L)  Hematocrit 39.0 - 52.0 % 34.2(L)  Platelets 150 - 400 K/uL 210    RADIOGRAPHIC STUDIES: I have personally reviewed the radiological images as listed and agreed with the findings in the report.  DG Chest Portable 1 View  Result Date: 03/03/2019 CLINICAL DATA:  Shortness of breath, since Monday worsening this morning around 1 a.m. EXAM: PORTABLE CHEST 1 VIEW COMPARISON:  12/19/2018 FINDINGS: Cardiomediastinal contours are stable following median sternotomy with persistent left-sided pleural effusion and pleural-parenchymal scarring. No new areas of consolidation or evidence of right-sided pleural effusion. No signs of acute bone process. IMPRESSION: Persistent left-sided pleural effusion and pleural-parenchymal scarring. Electronically Signed   By: Zetta Bills M.D.   On: 03/03/2019 13:59    Assessment and plan- Patient is a 64 y.o. male with history of stage IV lung  cancer, BRAF mutation on targeted therapy, hypertension, hyperlipidemia, CAD with history of CABG, diabetes, anxiety presented to emergency room due to breathing difficulties. Work-up showed DKA.  Also mental status change.  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  He has had excellent response to targeted therapy which was documented on CT scan on 01/22/2019. He has persistent small left sided pleural effusion which was loculated.  I  have previously discussed with Dr. Genevive Bi and no intervention was recommended at that point. On admission.  Patient had chest x-ray which was independently reviewed by me.  He has small pleural effusion which does not seem to be the cause of his dyspnea/breathing difficulty.  #DKA, patient has been on insulin drip.  Managed by primary team and ICU team. #AKI, likely secondary to dehydration due to previous nausea/vomiting episodes continue IV hydration.  Currently he is on D5 lactated Ringer infusion at 75 cc/h.  Creatinine is progressively getting worse despite hydration. ?ATN, obstruction. Recommend US renal. If no further improvement, I recommend nephrology consultation for further management.  #Atrial fibrillation, NSTEMI, cardiology on board.  #Mental status change, differential diagnosis include metabolic encephalopathy from DKA/AKI, etc, vs worsening CNS mets. Patient previously had punctate focus of enhancement within the right parietal lobe.  I would recommend MRI brain for further evaluation once he is stable.  Discussed with Dr. Patsey Berthold. Patient has a stage IV lung cancer which has responded excellently to targeted mutation.  It is unclear whether his decline of mental status is due to cancer progression versus acute reversible process.  I recommend to continue maximal supportive care, rule out infectious etiology, and see how he does in the next 24 to 48 hours. I discussed the plan with patient's wife. CODE STATUS DNR/DNI.  Palliative care has been consulted.    Thank you for allowing me to participate in the care of this patient.  Total face to face encounter time for this patient visit was 70 min. >50% of the time was  spent in counseling and coordination of care.    Earlie Server, MD, PhD Hematology Oncology [facility] Pager- [phone] 03/04/2019

## 2019-03-04 NOTE — Progress Notes (Signed)
Progress Note  Patient Name: Craig Koch Date of Encounter: 03/04/2019  Primary Cardiologist: Dr. Rockey Situ  Subjective   The patient is mildly lethargic but denies chest pain.  He continues to be in atrial fibrillation and currently is on amiodarone drip.  Inpatient Medications    Scheduled Meds: . aspirin EC  325 mg Oral Daily  . chlorhexidine  15 mL Mouth Rinse BID  . Chlorhexidine Gluconate Cloth  6 each Topical Daily  . dabrafenib mesylate  150 mg Oral BID  . dextromethorphan-guaiFENesin  1 tablet Oral BID  . dronabinol  5 mg Oral BID AC  . DULoxetine  30 mg Oral Daily  . folic acid  1 mg Oral Daily  . furosemide  40 mg Intravenous Daily  . insulin aspart  0-15 Units Subcutaneous Q4H  . insulin glargine  20 Units Subcutaneous Q24H  . ipratropium-albuterol  3 mL Nebulization Q4H  . LORazepam  0-4 mg Intravenous Q6H   Followed by  . [START ON 03/05/2019] LORazepam  0-4 mg Intravenous Q12H  . mouth rinse  15 mL Mouth Rinse q12n4p  . metoprolol tartrate  25 mg Oral BID  . multivitamin with minerals  1 tablet Oral Daily  . nicotine  21 mg Transdermal Daily  . rosuvastatin  10 mg Oral Daily  . thiamine  100 mg Oral Daily   Or  . thiamine  100 mg Intravenous Daily  . trametinib dimethyl sulfoxide  2 mg Oral Daily   Continuous Infusions: . amiodarone 30 mg/hr (03/04/19 1220)  . dextrose 5% lactated ringers 75 mL/hr at 03/04/19 0600  . diltiazem (CARDIZEM) infusion Stopped (03/03/19 2026)  . diltiazem (CARDIZEM) infusion 10 mg/hr (03/03/19 1815)  . heparin 1,450 Units/hr (03/04/19 1149)   PRN Meds: acetaminophen, albuterol, dextrose, LORazepam **OR** LORazepam, ondansetron (ZOFRAN) IV, pneumococcal 23 valent vaccine   Vital Signs    Vitals:   03/04/19 1022 03/04/19 1100 03/04/19 1122 03/04/19 1530  BP: 112/71 125/72    Pulse: (!) 118 (!) 116 (!) 105 (!) 118  Resp:  (!) 42 (!) 35 (!) 38  Temp:      TempSrc:      SpO2:  90% 91% 93%  Weight:      Height:         Intake/Output Summary (Last 24 hours) at 03/04/2019 1538 Last data filed at 03/04/2019 0600 Gross per 24 hour  Intake 2117.67 ml  Output --  Net 2117.67 ml   Last 3 Weights 03/03/2019 03/03/2019 02/14/2019  Weight (lbs) 184 lb 15.5 oz 190 lb 191 lb  Weight (kg) 83.9 kg 86.183 kg 86.637 kg      Telemetry    Atrial fibrillation with RVR.  Frequent PVCs with short runs of nonsustained ventricular tachycardia- Personally Reviewed  ECG    Atrial fibrillation with incomplete left bundle branch block.- Personally Reviewed  Physical Exam   GEN:  Lethargic and mild respiratory distress. Neck:  Jugular venous pressure is not well visualized Cardiac: Irregularly irregular and mildly tachycardic, no murmurs, rubs, or gallops.  Respiratory:  Diminished breath sounds at the base bilaterally GI: Soft, nontender, non-distended  MS: No edema; No deformity. Neuro:  Nonfocal  Psych: not able to evaluate  Labs    High Sensitivity Troponin:   Recent Labs  Lab 03/03/19 1547 03/03/19 1750  TROPONINIHS 13,356* >27,000*      Chemistry Recent Labs  Lab 03/03/19 1547 03/03/19 1547 03/03/19 1750 03/03/19 1936 03/03/19 2359 03/04/19 0526 03/04/19 1059  NA 123*   < >  123*   < > 127* 127* 127*  K 5.1   < > 5.4*   < > 3.5 3.5 3.7  CL 89*   < > 89*   < > 96* 99 102  CO2 <7*   < > 8*   < > 13* 14* 14*  GLUCOSE 718*   < > 756*   < > 440* 177* 134*  BUN 37*   < > 43*   < > 48* 53* 53*  CREATININE 1.60*   < > 1.79*   < > 1.98* 1.88* 2.13*  CALCIUM 8.2*   < > 8.2*   < > 8.0* 8.1* 8.0*  PROT 6.6  --  7.5  --   --   --   --   ALBUMIN 2.9*  --  3.2*  --   --   --   --   AST 81*  --  136*  --   --   --   --   ALT 18  --  25  --   --   --   --   ALKPHOS 80  --  104  --   --   --   --   BILITOT 2.3*  --  2.4*  --   --   --   --   GFRNONAA 45*   < > 39*   < > 35* 37* 32*  GFRAA 52*   < > 46*   < > 40* 43* 37*  ANIONGAP NOT CALCULATED   < > 26*   < > 18* 14 11   < > = values in this interval not  displayed.     Hematology Recent Labs  Lab 03/03/19 1330 03/03/19 2359 03/04/19 1059  WBC 9.4 9.9 8.5  RBC 4.91 4.41 4.44  HGB 13.4 12.2* 12.2*  HCT 42.9 36.2* 34.2*  MCV 87.4 82.1 77.0*  MCH 27.3 27.7 27.5  MCHC 31.2 33.7 35.7  RDW 16.8* 16.7* 16.4*  PLT 232 192 210    BNP Recent Labs  Lab 03/03/19 1330  BNP 330.0*     DDimer No results for input(s): DDIMER in the last 168 hours.   Radiology    DG Chest Portable 1 View  Result Date: 03/03/2019 CLINICAL DATA:  Shortness of breath, since Monday worsening this morning around 1 a.m. EXAM: PORTABLE CHEST 1 VIEW COMPARISON:  12/19/2018 FINDINGS: Cardiomediastinal contours are stable following median sternotomy with persistent left-sided pleural effusion and pleural-parenchymal scarring. No new areas of consolidation or evidence of right-sided pleural effusion. No signs of acute bone process. IMPRESSION: Persistent left-sided pleural effusion and pleural-parenchymal scarring. Electronically Signed   By: Zetta Bills M.D.   On: 03/03/2019 13:59   ECHOCARDIOGRAM COMPLETE  Result Date: 03/04/2019    ECHOCARDIOGRAM REPORT   Patient Name:   Craig Koch Date of Exam: 03/04/2019 Medical Rec #:  [id number]       Height:       72.0 in Accession #:    [id number]      Weight:       185.0 lb Date of Birth:  Mar 03, 1955       BSA:          2.061 m Patient Age:    64 years        BP:           125/72 mmHg Patient Gender: M               HR:  119 bpm. Exam Location:  [facility] Procedure: 2D Echo, Color Doppler and Cardiac Doppler Indications:     I21.4 NSTEMI  History:         Patient has prior history of Echocardiogram examinations, most                  recent 08/24/2018. CAD, Prior CABG; Risk Factors:Hypertension,                  Dyslipidemia and Diabetes. Lung Cancer.  Sonographer:     Charmayne Sheer RDCS (AE) Referring Phys:  [place] Phys: Kathlyn Sacramento MD IMPRESSIONS  1. Left ventricular ejection fraction, by  estimation, is 30 to 35%. The left ventricle has moderately decreased function. The left ventricle demonstrates global hypokinesis. Left ventricular diastolic parameters are indeterminate.  2. Right ventricular systolic function is normal. The right ventricular size is normal. Tricuspid regurgitation signal is inadequate for assessing PA pressure.  3. Left atrial size was mild to moderately dilated.  4. The mitral valve is normal in structure and function. Moderate mitral valve regurgitation. No evidence of mitral stenosis.  5. The aortic valve is normal in structure and function. Aortic valve regurgitation is trivial. Mild to moderate aortic valve sclerosis/calcification is present, without any evidence of aortic stenosis.  6. The inferior vena cava is normal in size with greater than 50% respiratory variability, suggesting right atrial pressure of 3 mmHg. FINDINGS  Left Ventricle: Left ventricular ejection fraction, by estimation, is 30 to 35%. The left ventricle has moderately decreased function. The left ventricle demonstrates global hypokinesis. The left ventricular internal cavity size was normal in size. There is no left ventricular hypertrophy. Left ventricular diastolic parameters are indeterminate. Right Ventricle: The right ventricular size is normal. No increase in right ventricular wall thickness. Right ventricular systolic function is normal. Tricuspid regurgitation signal is inadequate for assessing PA pressure. Left Atrium: Left atrial size was mild to moderately dilated. Right Atrium: Right atrial size was normal in size. Pericardium: There is no evidence of pericardial effusion. Mitral Valve: The mitral valve is normal in structure and function. Normal mobility of the mitral valve leaflets. Moderate mitral valve regurgitation. No evidence of mitral valve stenosis. MV peak gradient, 7.5 mmHg. The mean mitral valve gradient is 4.0  mmHg. Tricuspid Valve: The tricuspid valve is normal in structure.  Tricuspid valve regurgitation is not demonstrated. No evidence of tricuspid stenosis. Aortic Valve: The aortic valve is normal in structure and function. Aortic valve regurgitation is trivial. Aortic regurgitation PHT measures 281 msec. Mild to moderate aortic valve sclerosis/calcification is present, without any evidence of aortic stenosis. Aortic valve mean gradient measures 5.0 mmHg. Aortic valve peak gradient measures 8.5 mmHg. Aortic valve area, by VTI measures 2.53 cm. Pulmonic Valve: The pulmonic valve was normal in structure. Pulmonic valve regurgitation is not visualized. No evidence of pulmonic stenosis. Aorta: The aortic root is normal in size and structure. Venous: The inferior vena cava is normal in size with greater than 50% respiratory variability, suggesting right atrial pressure of 3 mmHg. IAS/Shunts: No atrial level shunt detected by color flow Doppler.  LEFT VENTRICLE PLAX 2D LVIDd:         5.21 cm  Diastology LVIDs:         4.21 cm  LV e' lateral:   7.83 cm/s LV PW:         0.84 cm  LV E/e' lateral: 13.4 LV IVS:        0.89 cm  LV e' medial:    7.94 cm/s LVOT diam:     2.40 cm  LV E/e' medial:  13.2 LV SV:         54 LV SV Index:   26 LVOT Area:     4.52 cm  RIGHT VENTRICLE RV Basal diam:  3.08 cm LEFT ATRIUM             Index       RIGHT ATRIUM           Index LA diam:        4.70 cm 2.28 cm/m  RA Area:     14.10 cm LA Vol (A2C):   62.0 ml 30.08 ml/m RA Volume:   35.20 ml  17.08 ml/m LA Vol (A4C):   68.6 ml 33.29 ml/m LA Biplane Vol: 65.3 ml 31.68 ml/m  AORTIC VALVE                    PULMONIC VALVE AV Area (Vmax):    2.95 cm     PV Vmax:       1.10 m/s AV Area (Vmean):   2.86 cm     PV Vmean:      68.400 cm/s AV Area (VTI):     2.53 cm     PV VTI:        0.146 m AV Vmax:           146.00 cm/s  PV Peak grad:  4.8 mmHg AV Vmean:          103.000 cm/s PV Mean grad:  2.0 mmHg AV VTI:            0.213 m AV Peak Grad:      8.5 mmHg AV Mean Grad:      5.0 mmHg LVOT Vmax:         95.30 cm/s  LVOT Vmean:        65.200 cm/s LVOT VTI:          0.119 m LVOT/AV VTI ratio: 0.56 AI PHT:            281 msec  AORTA Ao Root diam: 3.00 cm MITRAL VALVE MV Area (PHT): 3.92 cm     SHUNTS MV Peak grad:  7.5 mmHg     Systemic VTI:  0.12 m MV Mean grad:  4.0 mmHg     Systemic Diam: 2.40 cm MV Vmax:       1.37 m/s MV Vmean:      92.7 cm/s MV Decel Time: 194 msec MV E velocity: 104.80 cm/s Kathlyn Sacramento MD Electronically signed by Kathlyn Sacramento MD Signature Date/Time: 03/04/2019/2:46:43 PM    Final     Cardiac Studies   Echo done today: this was personally reviewed by me.  1. Left ventricular ejection fraction, by estimation, is 30 to 35%. The  left ventricle has moderately decreased function. The left ventricle  demonstrates global hypokinesis. Left ventricular diastolic parameters are  indeterminate.  2. Right ventricular systolic function is normal. The right ventricular  size is normal. Tricuspid regurgitation signal is inadequate for assessing  PA pressure.  3. Left atrial size was mild to moderately dilated.  4. The mitral valve is normal in structure and function. Moderate mitral  valve regurgitation. No evidence of mitral stenosis.  5. The aortic valve is normal in structure and function. Aortic valve  regurgitation is trivial. Mild to moderate aortic valve  sclerosis/calcification is present, without any evidence of aortic  stenosis.  6. The inferior vena cava is normal in size with greater than 50%  respiratory variability, suggesting right atrial pressure of 3 mmHg.    Patient Profile     64 y.o. male with history of coronary artery disease status post CABG, poorly controlled diabetes, tobacco use, essential hypertension, hyperlipidemia, history of alcohol abuse and stage IV adenocarcinoma of the lungs who presented with worsening shortness of breath and mental status changes.  He was found to be hypoxic and in A. fib with RVR with blood sugar greater than 700.  Assessment &  Plan    1.  Atrial fibrillation with RVR: In the setting of significant underlying metabolic abnormalities.  Rate control is limited by low blood pressure.  He is currently on amiodarone drip and his ventricular rate seems to be reasonably controlled between 100 to 120 bpm.  He is also on heparin drip.  He was given 2 doses of digoxin over the weekend but will hold off on giving any further given renal failure.  2.  Non-ST elevation myocardial infarction: Troponin greater than 27,000.  Suspect graft occlusion.  Echocardiogram showed moderately reduced LV systolic function.  Continue medical therapy for now including unfractionated heparin and consider cardiac catheterization once the patient is more stable.  3.  Stage IV adenocarcinoma of the lungs: I reviewed notes from oncology and it appears that he responded well to treatment so far.  4.  Poorly controlled diabetes with severe hyperglycemia: Gradually improving with treatment.  I discussed with nursing and reviewed notes of oncology and palliative care medicine.  Patient is currently DNR.      For questions or updates, please contact [place] Please consult [email or web address] for contact info under        Signed, Kathlyn Sacramento, MD  03/04/2019, 3:38 PM

## 2019-03-04 NOTE — Consult Note (Signed)
ANTICOAGULATION CONSULT NOTE  Pharmacy Consult for Heparin  Indication: atrial fibrillation- new onset  Patient Measurements: Height: 6' (182.9 cm) Weight: 184 lb 15.5 oz (83.9 kg) IBW/kg (Calculated) : 77.6 Heparin Dosing Weight: 86.2 kg   Vital Signs: Temp: 98.1 F (36.7 C) (03/01 0800) BP: 125/72 (03/01 1100) Pulse Rate: 118 (03/01 1530)  Labs: Recent Labs    03/03/19 1330 03/03/19 1547 03/03/19 1547 03/03/19 1750 03/03/19 1936 03/03/19 2359 03/03/19 2359 03/04/19 0526 03/04/19 1059 03/04/19 1849  HGB 13.4  --    < >  --   --  12.2*  --   --  12.2*  --   HCT 42.9  --   --   --   --  36.2*  --   --  34.2*  --   PLT 232  --   --   --   --  192  --   --  210  --   APTT  --   --   --  85*  --   --   --   --   --   --   LABPROT  --   --   --  14.6  --   --   --   --   --   --   INR  --   --   --  1.2  --   --   --   --   --   --   HEPARINUNFRC  --   --   --   --   --  0.26*   < > 0.31 0.24* 0.28*  CREATININE  --  1.60*   < > 1.79*   < > 1.98*  --  1.88* 2.13*  --   TROPONINIHS  --  13,356*  --  >27,000*  --   --   --   --   --   --    < > = values in this interval not displayed.    Estimated Creatinine Clearance: 39 mL/min (A) (by C-G formula based on SCr of 2.13 mg/dL (H)).   Medications:  Called RN to confirm whether pt has been on [facility] prior to admission. Nurse was not able to confirm as she was not in the patient's room. Per chart review, no AC prior to admission and confirmed with pharmacy technician who spoke to patient.   Heparin Course: 02/28 initiation: 1150 units/hr 02/28 2350 HL 0.26: inc to 1300 units/hr 03/01 0530 HL 0.31: no change 03/01 1059 HL 0.24: inc to 1450 units/hr 03/01 1849 HL 0.28: inc to 1550 units/hr  Assessment: Craig Koch is a 64 y.o. male with a past medical history of anxiety, CAD, diabetes, and hyperlipidemia, hypertension, lung cancer, presents to the emergency department for shortness of breath. Pharmacy was consulted for  heparin dosing in a patient with new onset atrial fibrillation. H&H, platelets are stable  Goal of Therapy:  Heparin level 0.3-0.7 units/ml Monitor platelets by anticoagulation protocol: Yes   Plan:  3/1 1849 HL 0.28 slightly subtherapeutic. Increase heparin drip to 1550 units/hr. HL 3/2 at 0200. CBC daily while on heparin drip.  Dorena Bodo, PharmD Clinical Pharmacist 03/04/2019,7:20 PM

## 2019-03-05 ENCOUNTER — Inpatient Hospital Stay: Payer: Medicare Other

## 2019-03-05 ENCOUNTER — Encounter: Payer: Self-pay | Admitting: Internal Medicine

## 2019-03-05 DIAGNOSIS — I25118 Atherosclerotic heart disease of native coronary artery with other forms of angina pectoris: Secondary | ICD-10-CM

## 2019-03-05 DIAGNOSIS — J441 Chronic obstructive pulmonary disease with (acute) exacerbation: Secondary | ICD-10-CM

## 2019-03-05 DIAGNOSIS — Z7189 Other specified counseling: Secondary | ICD-10-CM

## 2019-03-05 LAB — CBC
HCT: 31.1 % — ABNORMAL LOW (ref 39.0–52.0)
Hemoglobin: 11.1 g/dL — ABNORMAL LOW (ref 13.0–17.0)
MCH: 27.8 pg (ref 26.0–34.0)
MCHC: 35.7 g/dL (ref 30.0–36.0)
MCV: 77.8 fL — ABNORMAL LOW (ref 80.0–100.0)
Platelets: 189 10*3/uL (ref 150–400)
RBC: 4 MIL/uL — ABNORMAL LOW (ref 4.22–5.81)
RDW: 17.1 % — ABNORMAL HIGH (ref 11.5–15.5)
WBC: 8.5 10*3/uL (ref 4.0–10.5)
nRBC: 0 % (ref 0.0–0.2)

## 2019-03-05 LAB — HEPARIN LEVEL (UNFRACTIONATED)
Heparin Unfractionated: 0.33 IU/mL (ref 0.30–0.70)
Heparin Unfractionated: 0.28 IU/mL — ABNORMAL LOW (ref 0.30–0.70)
Heparin Unfractionated: 0.3 IU/mL (ref 0.30–0.70)

## 2019-03-05 LAB — BASIC METABOLIC PANEL
Anion gap: 11 (ref 5–15)
BUN: 65 mg/dL — ABNORMAL HIGH (ref 8–23)
CO2: 15 mmol/L — ABNORMAL LOW (ref 22–32)
Calcium: 8 mg/dL — ABNORMAL LOW (ref 8.9–10.3)
Chloride: 102 mmol/L (ref 98–111)
Creatinine, Ser: 3.16 mg/dL — ABNORMAL HIGH (ref 0.61–1.24)
GFR calc Af Amer: 23 mL/min — ABNORMAL LOW (ref >60)
GFR calc non Af Amer: 20 mL/min — ABNORMAL LOW (ref >60)
Glucose, Bld: 311 mg/dL — ABNORMAL HIGH (ref 70–99)
Potassium: 3.5 mmol/L (ref 3.5–5.1)
Sodium: 128 mmol/L — ABNORMAL LOW (ref 135–145)

## 2019-03-05 LAB — GLUCOSE, CAPILLARY
Glucose-Capillary: 144 mg/dL — ABNORMAL HIGH (ref 70–99)
Glucose-Capillary: 168 mg/dL — ABNORMAL HIGH (ref 70–99)
Glucose-Capillary: 178 mg/dL — ABNORMAL HIGH (ref 70–99)
Glucose-Capillary: 179 mg/dL — ABNORMAL HIGH (ref 70–99)
Glucose-Capillary: 216 mg/dL — ABNORMAL HIGH (ref 70–99)
Glucose-Capillary: 281 mg/dL — ABNORMAL HIGH (ref 70–99)

## 2019-03-05 LAB — MAGNESIUM: Magnesium: 1.9 mg/dL (ref 1.7–2.4)

## 2019-03-05 LAB — PROCALCITONIN: Procalcitonin: 14.44 ng/mL

## 2019-03-05 LAB — PHOSPHORUS: Phosphorus: 1.4 mg/dL — ABNORMAL LOW (ref 2.5–4.6)

## 2019-03-05 MED ORDER — IPRATROPIUM-ALBUTEROL 0.5-2.5 (3) MG/3ML IN SOLN
3.0000 mL | Freq: Four times a day (QID) | RESPIRATORY_TRACT | Status: DC
  Administered 2019-03-05 – 2019-03-06 (×5): 3 mL via RESPIRATORY_TRACT
  Filled 2019-03-05 (×5): qty 3

## 2019-03-05 MED ORDER — PIPERACILLIN-TAZOBACTAM 3.375 G IVPB
3.3750 g | Freq: Three times a day (TID) | INTRAVENOUS | Status: DC
  Administered 2019-03-05 – 2019-03-06 (×3): 3.375 g via INTRAVENOUS
  Filled 2019-03-05 (×7): qty 50

## 2019-03-05 MED ORDER — VANCOMYCIN HCL 1500 MG/300ML IV SOLN
1500.0000 mg | Freq: Once | INTRAVENOUS | Status: AC
  Administered 2019-03-05: 1500 mg via INTRAVENOUS
  Filled 2019-03-05: qty 300

## 2019-03-05 MED ORDER — INSULIN GLARGINE 100 UNIT/ML ~~LOC~~ SOLN
35.0000 [IU] | SUBCUTANEOUS | Status: DC
  Administered 2019-03-05 – 2019-03-06 (×2): 35 [IU] via SUBCUTANEOUS
  Filled 2019-03-05 (×2): qty 0.35

## 2019-03-05 MED ORDER — THIAMINE HCL 100 MG/ML IJ SOLN
INTRAVENOUS | Status: DC
  Filled 2019-03-05 (×14): qty 1

## 2019-03-05 MED ORDER — VANCOMYCIN VARIABLE DOSE PER UNSTABLE RENAL FUNCTION (PHARMACIST DOSING): Status: DC

## 2019-03-05 MED ORDER — FOLIC ACID 5 MG/ML IJ SOLN: 1.0000 mg | Freq: Every day | INTRAMUSCULAR | Status: DC

## 2019-03-05 NOTE — Consult Note (Signed)
[address] [place], [place] [address] Phone [phone]. Fax [phone]  Date: 03/05/2019                  Patient Name:  Craig Koch  MRN: [id number]  DOB: 1955-01-24  Age / Sex: 64 y.o., male         PCP: Tonia Ghent, MD                 Service Requesting Consult: IM/ Sidney Ace, MD                 Reason for Consult: ARF            History of Present Illness: Patient is a 64 y.o. male with medical problems of anxiety, CAD, atrial fibrillation, non-STEMI, DM, HLD, HTN, adenocarcinoma of the lung stage IV, tobacco and alcohol abuse, who was admitted to [facility] on 03/03/2019 for evaluation of Shortness of breath.  Patient has been having shortness of breath, dry cough, generalized weakness several days prior to admission.  Noted to have atrial fibrillation with RVR in the emergency room.  Also found to have diabetic ketoacidosis.  Then transferred to ICU for hypotension, hemodynamic instability from atrial fibrillation, insulin and Cardizem drips Nephrology consult requested for acute kidney injury Today's creatinine has increased to 3.16 Baseline creatinine of 0.70 from February 07, 2018 IV contrast exposure January 22, 2019 for CT angiogram of the chest   Medications: Outpatient medications: Medications Prior to Admission  Medication Sig Dispense Refill Last Dose  . dabrafenib mesylate (TAFINLAR) 75 MG capsule Take 2 capsules (150 mg total) by mouth 2 (two) times daily. Take on an empty stomach 1 hour before or 2 hours after meals. 120 capsule 3 03/03/2019 at 0800  . dronabinol (MARINOL) 5 MG capsule Take 1 capsule (5 mg total) by mouth 2 (two) times daily before a meal. 60 capsule 0 03/02/2019 at 1800  . DULoxetine (CYMBALTA) 30 MG capsule TAKE 1 CAPSULE BY MOUTH EVERY DAY (Patient taking differently: Take 30 mg by mouth daily. ) 90 capsule 1 03/02/2019 at 0900  . insulin detemir (LEVEMIR) 100 UNIT/ML injection Inject 0.4-0.6 mLs (40-60 Units total)  into the skin at bedtime. Start at 40 units at night.  Add 1 unit per day until AM sugar is ~150 10 mL 5 03/02/2019 at 2100  . insulin lispro (HUMALOG) 100 UNIT/ML injection Inject 0.2 mLs (20 Units total) into the skin 3 (three) times daily with meals. 10 mL 5 03/02/2019 at 1800  . metFORMIN (GLUCOPHAGE) 500 MG tablet TAKE 1 TABLET (500 MG TOTAL) BY MOUTH 2 (TWO) TIMES DAILY WITH A MEAL. 180 tablet 2 03/02/2019 at 1800  . metoprolol tartrate (LOPRESSOR) 100 MG tablet Take 0.5 tablets (50 mg total) by mouth daily.   03/02/2019 at 0900  . ondansetron (ZOFRAN) 8 MG tablet Take 1 tablet (8 mg total) by mouth every 8 (eight) hours as needed for nausea or vomiting. 45 tablet 0 03/02/2019 at 2100  . rosuvastatin (CRESTOR) 10 MG tablet TAKE 1 TABLET (10 MG TOTAL) BY MOUTH DAILY. 90 tablet 3 03/02/2019 at 1800  . trametinib dimethyl sulfoxide (MEKINIST) 2 MG tablet Take 1 tablet (2 mg total) by mouth daily. Take 1 hour before or 2 hours after a meal. Store refrigerated in original container. 30 tablet 3 03/03/2019 at 0800  . albuterol (VENTOLIN HFA) 108 (90 Base) MCG/ACT inhaler Inhale 2 puffs into the lungs every 6 (six) hours as needed for wheezing or  shortness of breath. 18 g 0 unknown at prn  . BD INSULIN SYRINGE U/F 31G X 5/16" 0.5 ML MISC USE DAILY AS DIRECTED. DX E11.9 100 each 1   . glucose blood (ONE TOUCH ULTRA TEST) test strip USE TO TEST GLUCOSE LEVELS 4 (FOUR) TIMES DAILY. 400 each 5   . triamcinolone cream (KENALOG) 0.1 % Apply 1 application topically 2 (two) times daily. 30 g 0 unknown at prn    Current medications: Current Facility-Administered Medications  Medication Dose Route Frequency Provider Last Rate Last Admin  . acetaminophen (TYLENOL) tablet 650 mg  650 mg Oral Q6H PRN Ivor Costa, MD      . albuterol (PROVENTIL) (2.5 MG/3ML) 0.083% nebulizer solution 3 mL  3 mL Inhalation Q4H PRN Ivor Costa, MD      . amiodarone (NEXTERONE PREMIX) 360-4.14 MG/200ML-% (1.8 mg/mL) IV infusion  30 mg/hr  Intravenous Continuous Darel Hong D, NP 16.67 mL/hr at 03/05/19 0018 30 mg/hr at 03/05/19 0018  . aspirin EC tablet 325 mg  325 mg Oral Daily Ivor Costa, MD   325 mg at 03/04/19 1027  . chlorhexidine (PERIDEX) 0.12 % solution 15 mL  15 mL Mouth Rinse BID Darel Hong D, NP   15 mL at 03/04/19 2156  . Chlorhexidine Gluconate Cloth 2 % PADS 6 each  6 each Topical Daily Darel Hong D, NP      . dabrafenib mesylate (TAFINLAR) capsule 150 mg  150 mg Oral BID Ivor Costa, MD      . dextromethorphan-guaiFENesin [facility] DM) 30-600 MG per 12 hr tablet 1 tablet  1 tablet Oral BID Ivor Costa, MD   1 tablet at 03/04/19 1030  . dextrose 5 % in lactated ringers infusion   Intravenous Continuous Ivor Costa, MD 75 mL/hr at 03/04/19 2104 New Bag at 03/04/19 2104  . dextrose 50 % solution 0-50 mL  0-50 mL Intravenous PRN Ivor Costa, MD      . diltiazem (CARDIZEM) 125 mg in dextrose 5% 125 mL (1 mg/mL) infusion  5-15 mg/hr Intravenous Titrated Ivor Costa, MD   Stopped at 03/03/19 2026  . diltiazem (CARDIZEM) 125 mg in dextrose 5% 125 mL (1 mg/mL) infusion  5-15 mg/hr Intravenous Titrated Ivor Costa, MD 10 mL/hr at 03/03/19 1815 10 mg/hr at 03/03/19 1815  . dronabinol (MARINOL) capsule 5 mg  5 mg Oral BID AC Ivor Costa, MD   5 mg at 03/04/19 0840  . DULoxetine (CYMBALTA) DR capsule 30 mg  30 mg Oral Daily Ivor Costa, MD   30 mg at 03/04/19 0959  . folic acid (FOLVITE) tablet 1 mg  1 mg Oral Daily Ivor Costa, MD   1 mg at 03/04/19 0959  . furosemide (LASIX) injection 40 mg  40 mg Intravenous Daily Sreenath, Sudheer B, MD   40 mg at 03/04/19 1045  . heparin ADULT infusion 100 units/mL (25000 units/237mL sodium chloride 0.45%)  1,550 Units/hr Intravenous Continuous Ralene Muskrat B, MD 15.5 mL/hr at 03/05/19 0715 1,550 Units/hr at 03/05/19 0715  . insulin aspart (novoLOG) injection 0-15 Units  0-15 Units Subcutaneous Q4H Ralene Muskrat B, MD   5 Units at 03/05/19 0726  . insulin glargine (LANTUS)  injection 20 Units  20 Units Subcutaneous Q24H Ralene Muskrat B, MD   20 Units at 03/04/19 1246  . ipratropium-albuterol (DUONEB) 0.5-2.5 (3) MG/3ML nebulizer solution 3 mL  3 mL Nebulization Q4H Sreenath, Sudheer B, MD   3 mL at 03/05/19 0749  . LORazepam (ATIVAN) injection 0-4 mg  0-4 mg Intravenous Q6H Ivor Costa, MD   2 mg at 03/05/19 0522   Followed by  . LORazepam (ATIVAN) injection 0-4 mg  0-4 mg Intravenous Q12H Ivor Costa, MD      . LORazepam (ATIVAN) tablet 1-4 mg  1-4 mg Oral Q1H PRN Ivor Costa, MD   2 mg at 03/04/19 4097   Or  . LORazepam (ATIVAN) injection 1-4 mg  1-4 mg Intravenous Q1H PRN Ivor Costa, MD   2 mg at 03/04/19 2055  . MEDLINE mouth rinse  15 mL Mouth Rinse q12n4p Darel Hong D, NP   15 mL at 03/04/19 1600  . metoprolol tartrate (LOPRESSOR) tablet 25 mg  25 mg Oral BID Ralene Muskrat B, MD      . multivitamin with minerals tablet 1 tablet  1 tablet Oral Daily Ivor Costa, MD   1 tablet at 03/04/19 1001  . nicotine (NICODERM CQ - dosed in mg/24 hours) patch 21 mg  21 mg Transdermal Daily Ivor Costa, MD   21 mg at 03/04/19 1021  . ondansetron (ZOFRAN) injection 4 mg  4 mg Intravenous Q8H PRN Ivor Costa, MD      . pneumococcal 23 valent vaccine (PNEUMOVAX-23) injection 0.5 mL  0.5 mL Intramuscular Prior to discharge Ralene Muskrat B, MD      . rosuvastatin (CRESTOR) tablet 10 mg  10 mg Oral Daily Ivor Costa, MD   10 mg at 03/04/19 1035  . thiamine tablet 100 mg  100 mg Oral Daily Ivor Costa, MD   100 mg at 03/04/19 3532   Or  . thiamine (B-1) injection 100 mg  100 mg Intravenous Daily Ivor Costa, MD      . trametinib dimethyl sulfoxide (MEKINIST) tablet 2 mg  2 mg Oral Daily Ivor Costa, MD          Allergies: Allergies  Allergen Reactions  . Lipitor [Atorvastatin Calcium] Other (See Comments)    Aches.  Tolerated crestor.       Past Medical History: Past Medical History:  Diagnosis Date  . Anxiety   . Arthritis   . Coronary artery disease   .  Diabetes mellitus   . Dyslipidemia   . Hx of CABG   . Hyperlipidemia   . Hypertension   . Malignant neoplasm of unspecified part of unspecified bronchus or lung ([place]) 08/2018   Immunotherapy  . Pleural effusion      Past Surgical History: Past Surgical History:  Procedure Laterality Date  . CATARACT EXTRACTION Left 09/2015  . CHEST TUBE INSERTION Left 10/01/2018   Procedure: INSERTION PLEURAL DRAINAGE CATHETER;  Surgeon: [name], MD;  Location: [facility] ORS;  Service: Thoracic;  Laterality: Left;  . CORONARY ARTERY BYPASS GRAFT  2004   (CABG with LIMA to the  LAD, SVG to OM2/OM3, SVG  to diag  . Left ankle surgery     repair of fracture  . Right lower leg surgery     rod     Family History: Family History  Problem Relation Age of Onset  . Dementia Mother   . Heart disease Father   . Colon cancer Neg Hx   . Prostate cancer Neg Hx   . Diabetes Neg Hx      Social History: Social History   Socioeconomic History  . Marital status: Married    Spouse name: Not on file  . Number of children: Not on file  . Years of education: Not on file  . Highest education level: Not on  file  Occupational History  . Not on file  Tobacco Use  . Smoking status: Current Every Day Smoker    Packs/day: 0.20    Years: 45.00    Pack years: 9.00    Types: Cigarettes  . Smokeless tobacco: Former Systems developer    Types: Snuff  Substance and Sexual Activity  . Alcohol use: Yes    Alcohol/week: 6.0 standard drinks    Types: 6 Cans of beer per week    Comment: occ, average 6 pack in a week  . Drug use: No  . Sexual activity: Never  Other Topics Concern  . Not on file  Social History Narrative   On disability 2009 after prev injuries and CAD.     Married 1976   2 kids, 4 grandkids.    Social Determinants of Health   Financial Resource Strain:   . Difficulty of Paying Living Expenses: Not on file  Food Insecurity:   . Worried About Charity fundraiser in the Last Year: Not on file  .  Ran Out of Food in the Last Year: Not on file  Transportation Needs:   . Lack of Transportation (Medical): Not on file  . Lack of Transportation (Non-Medical): Not on file  Physical Activity:   . Days of Exercise per Week: Not on file  . Minutes of Exercise per Session: Not on file  Stress:   . Feeling of Stress : Not on file  Social Connections:   . Frequency of Communication with Friends and Family: Not on file  . Frequency of Social Gatherings with Friends and Family: Not on file  . Attends Religious Services: Not on file  . Active Member of Clubs or Organizations: Not on file  . Attends Archivist Meetings: Not on file  . Marital Status: Not on file  Intimate Partner Violence:   . Fear of Current or Ex-Partner: Not on file  . Emotionally Abused: Not on file  . Physically Abused: Not on file  . Sexually Abused: Not on file     Review of Systems: Unavailable due to patient's mental status Gen:  HEENT:  CV:  Resp:  GI: GU :  MS:  Derm:    Psych: Heme:  Neuro:  Endocrine  Vital Signs: Blood pressure 128/65, pulse (!) 107, temperature 98 F (36.7 C), temperature source Axillary, resp. rate (!) 32, height 6' (1.829 m), weight 83.9 kg, SpO2 99 %.   Intake/Output Summary (Last 24 hours) at 03/05/2019 0820 Last data filed at 03/04/2019 2000 Gross per 24 hour  Intake 1055.02 ml  Output 750 ml  Net 305.02 ml    Weight trends: [employer]   03/03/19 1328 03/03/19 2000  Weight: 86.2 kg 83.9 kg    Physical Exam: General:  Chronically ill-appearing, laying in the bed, mumbling  HEENT  mouth is dry, poor dentition  Neck:  No masses  Lungs:  Tachypneic, decreased breath sounds at bases  Heart::  Tachycardic, irregular  Abdomen:  Soft, mild diffuse tenderness  Extremities:  Trace edema  Neurologic:  Lethargic, not able to answer any questions  Skin:  Warm  Foley:  In place       Lab results: Basic Metabolic Panel: Recent Labs  Lab 03/03/19 1750  03/03/19 1936 03/04/19 0526 03/04/19 1059 03/05/19 0153  NA 123*   < > 127* 127* 128*  K 5.4*   < > 3.5 3.7 3.5  CL 89*   < > 99 102 102  CO2 8*   < >  14* 14* 15*  GLUCOSE 756*   < > 177* 134* 311*  BUN 43*   < > 53* 53* 65*  CREATININE 1.79*   < > 1.88* 2.13* 3.16*  CALCIUM 8.2*   < > 8.1* 8.0* 8.0*  MG 2.3  --   --   --  1.9  PHOS  --   --   --   --  1.4*   < > = values in this interval not displayed.    Liver Function Tests: Recent Labs  Lab 03/03/19 1750  AST 136*  ALT 25  ALKPHOS 104  BILITOT 2.4*  PROT 7.5  ALBUMIN 3.2*   No results for input(s): LIPASE, AMYLASE in the last 168 hours. No results for input(s): AMMONIA in the last 168 hours.  CBC: Recent Labs  Lab 03/03/19 1330 03/03/19 2359 03/04/19 1059 03/05/19 0153  WBC 9.4   < > 8.5 8.5  NEUTROABS 7.3  --   --   --   HGB 13.4   < > 12.2* 11.1*  HCT 42.9   < > 34.2* 31.1*  MCV 87.4   < > 77.0* 77.8*  PLT 232   < > 210 189   < > = values in this interval not displayed.    Cardiac Enzymes: No results for input(s): CKTOTAL, TROPONINI in the last 168 hours.  BNP: Invalid input(s): POCBNP  CBG: Recent Labs  Lab 03/04/19 1531 03/04/19 2015 03/04/19 2330 03/05/19 0357 03/05/19 0719  GLUCAP 301* 293* 300* 281* 216*    Microbiology: Recent Results (from the past 720 hour(s))  Respiratory Panel by RT PCR (Flu A&B, Covid) - Nasopharyngeal Swab     Status: None   Collection Time: 03/03/19  3:31 PM   Specimen: Nasopharyngeal Swab  Result Value Ref Range Status   SARS Coronavirus 2 by RT PCR NEGATIVE NEGATIVE Final    Comment: (NOTE) SARS-CoV-2 target nucleic acids are NOT DETECTED. The SARS-CoV-2 RNA is generally detectable in upper respiratoy specimens during the acute phase of infection. The lowest concentration of SARS-CoV-2 viral copies this assay can detect is 131 copies/mL. A negative result does not preclude SARS-Cov-2 infection and should not be used as the sole basis for treatment  or other patient management decisions. A negative result may occur with  improper specimen collection/handling, submission of specimen other than nasopharyngeal swab, presence of viral mutation(s) within the areas targeted by this assay, and inadequate number of viral copies (<131 copies/mL). A negative result must be combined with clinical observations, patient history, and epidemiological information. The expected result is Negative. Fact Sheet for Patients:  PinkCheek.be Fact Sheet for Healthcare Providers:  GravelBags.it This test is not yet ap proved or cleared by the [place] FDA and  has been authorized for detection and/or diagnosis of SARS-CoV-2 by FDA under an Emergency Use Authorization (EUA). This EUA will remain  in effect (meaning this test can be used) for the duration of the COVID-19 declaration under Section 564(b)(1) of the Act, 21 U.S.C. section 360bbb-3(b)(1), unless the authorization is terminated or revoked sooner.    Influenza A by PCR NEGATIVE NEGATIVE Final   Influenza B by PCR NEGATIVE NEGATIVE Final    Comment: (NOTE) The Xpert Xpress SARS-CoV-2/FLU/RSV assay is intended as an aid in  the diagnosis of influenza from Nasopharyngeal swab specimens and  should not be used as a sole basis for treatment. Nasal washings and  aspirates are unacceptable for Xpert Xpress SARS-CoV-2/FLU/RSV  testing. Fact Sheet for Patients: PinkCheek.be  Fact Sheet for Healthcare Providers: GravelBags.it This test is not yet approved or cleared by the [place] FDA and  has been authorized for detection and/or diagnosis of SARS-CoV-2 by  FDA under an Emergency Use Authorization (EUA). This EUA will remain  in effect (meaning this test can be used) for the duration of the  Covid-19 declaration under Section 564(b)(1) of the Act, 21  U.S.C. section  360bbb-3(b)(1), unless the authorization is  terminated or revoked. Performed at [facility], [address] [place], [place] [address]   MRSA PCR Screening     Status: None   Collection Time: 03/03/19  7:48 PM   Specimen: Nasopharyngeal  Result Value Ref Range Status   MRSA by PCR NEGATIVE NEGATIVE Final    Comment:        The GeneXpert MRSA Assay (FDA approved for NASAL specimens only), is one component of a comprehensive MRSA colonization surveillance program. It is not intended to diagnose MRSA infection nor to guide or monitor treatment for MRSA infections. Performed at [facility], [address] [place], [place] [address]      Coagulation Studies: Recent Labs    03/03/19 1750  LABPROT 14.6  INR 1.2    Urinalysis: No results for input(s): COLORURINE, LABSPEC, PHURINE, GLUCOSEU, HGBUR, BILIRUBINUR, KETONESUR, PROTEINUR, UROBILINOGEN, NITRITE, LEUKOCYTESUR in the last 72 hours.  Invalid input(s): APPERANCEUR      Imaging: US RENAL  Result Date: 03/04/2019 CLINICAL DATA:  Acute kidney injury EXAM: RENAL / URINARY TRACT ULTRASOUND COMPLETE COMPARISON:  CT 01/22/2019 FINDINGS: Right Kidney: Renal measurements: 11.4 x 6 x 6 cm = volume: 215 mL . Echogenicity within normal limits. No mass or hydronephrosis visualized. Left Kidney: Renal measurements: 11.8 x 7.3 x 6.5 cm = volume: 291.8 mL. Echogenicity within normal limits. No mass or hydronephrosis visualized. Bladder: Appears normal for degree of bladder distention. Other: Incidental note made of gallstones and small effusions. Heterogenous prostate. IMPRESSION: Normal ultrasound appearance of the kidneys Electronically Signed   By: Donavan Foil M.D.   On: 03/04/2019 15:37   DG Chest Portable 1 View  Result Date: 03/03/2019 CLINICAL DATA:  Shortness of breath, since Monday worsening this morning around 1 a.m. EXAM: PORTABLE CHEST 1 VIEW COMPARISON:  12/19/2018 FINDINGS: Cardiomediastinal  contours are stable following median sternotomy with persistent left-sided pleural effusion and pleural-parenchymal scarring. No new areas of consolidation or evidence of right-sided pleural effusion. No signs of acute bone process. IMPRESSION: Persistent left-sided pleural effusion and pleural-parenchymal scarring. Electronically Signed   By: Zetta Bills M.D.   On: 03/03/2019 13:59   ECHOCARDIOGRAM COMPLETE  Result Date: 03/04/2019    ECHOCARDIOGRAM REPORT   Patient Name:   Craig Koch Date of Exam: 03/04/2019 Medical Rec #:  [id number]       Height:       72.0 in Accession #:    [id number]      Weight:       185.0 lb Date of Birth:  1955-04-01       BSA:          2.061 m Patient Age:    77 years        BP:           125/72 mmHg Patient Gender: M               HR:           119 bpm. Exam Location:  [facility] Procedure: 2D Echo, Color Doppler and Cardiac  Doppler Indications:     I21.4 NSTEMI  History:         Patient has prior history of Echocardiogram examinations, most                  recent 08/24/2018. CAD, Prior CABG; Risk Factors:Hypertension,                  Dyslipidemia and Diabetes. Lung Cancer.  Sonographer:     Charmayne Sheer RDCS (AE) Referring Phys:  Max Phys: Kathlyn Sacramento MD IMPRESSIONS  1. Left ventricular ejection fraction, by estimation, is 30 to 35%. The left ventricle has moderately decreased function. The left ventricle demonstrates global hypokinesis. Left ventricular diastolic parameters are indeterminate.  2. Right ventricular systolic function is normal. The right ventricular size is normal. Tricuspid regurgitation signal is inadequate for assessing PA pressure.  3. Left atrial size was mild to moderately dilated.  4. The mitral valve is normal in structure and function. Moderate mitral valve regurgitation. No evidence of mitral stenosis.  5. The aortic valve is normal in structure and function. Aortic valve regurgitation is trivial. Mild to moderate aortic valve  sclerosis/calcification is present, without any evidence of aortic stenosis.  6. The inferior vena cava is normal in size with greater than 50% respiratory variability, suggesting right atrial pressure of 3 mmHg. FINDINGS  Left Ventricle: Left ventricular ejection fraction, by estimation, is 30 to 35%. The left ventricle has moderately decreased function. The left ventricle demonstrates global hypokinesis. The left ventricular internal cavity size was normal in size. There is no left ventricular hypertrophy. Left ventricular diastolic parameters are indeterminate. Right Ventricle: The right ventricular size is normal. No increase in right ventricular wall thickness. Right ventricular systolic function is normal. Tricuspid regurgitation signal is inadequate for assessing PA pressure. Left Atrium: Left atrial size was mild to moderately dilated. Right Atrium: Right atrial size was normal in size. Pericardium: There is no evidence of pericardial effusion. Mitral Valve: The mitral valve is normal in structure and function. Normal mobility of the mitral valve leaflets. Moderate mitral valve regurgitation. No evidence of mitral valve stenosis. MV peak gradient, 7.5 mmHg. The mean mitral valve gradient is 4.0  mmHg. Tricuspid Valve: The tricuspid valve is normal in structure. Tricuspid valve regurgitation is not demonstrated. No evidence of tricuspid stenosis. Aortic Valve: The aortic valve is normal in structure and function. Aortic valve regurgitation is trivial. Aortic regurgitation PHT measures 281 msec. Mild to moderate aortic valve sclerosis/calcification is present, without any evidence of aortic stenosis. Aortic valve mean gradient measures 5.0 mmHg. Aortic valve peak gradient measures 8.5 mmHg. Aortic valve area, by VTI measures 2.53 cm. Pulmonic Valve: The pulmonic valve was normal in structure. Pulmonic valve regurgitation is not visualized. No evidence of pulmonic stenosis. Aorta: The aortic root is normal in  size and structure. Venous: The inferior vena cava is normal in size with greater than 50% respiratory variability, suggesting right atrial pressure of 3 mmHg. IAS/Shunts: No atrial level shunt detected by color flow Doppler.  LEFT VENTRICLE PLAX 2D LVIDd:         5.21 cm  Diastology LVIDs:         4.21 cm  LV e' lateral:   7.83 cm/s LV PW:         0.84 cm  LV E/e' lateral: 13.4 LV IVS:        0.89 cm  LV e' medial:    7.94 cm/s LVOT diam:  2.40 cm  LV E/e' medial:  13.2 LV SV:         54 LV SV Index:   26 LVOT Area:     4.52 cm  RIGHT VENTRICLE RV Basal diam:  3.08 cm LEFT ATRIUM             Index       RIGHT ATRIUM           Index LA diam:        4.70 cm 2.28 cm/m  RA Area:     14.10 cm LA Vol (A2C):   62.0 ml 30.08 ml/m RA Volume:   35.20 ml  17.08 ml/m LA Vol (A4C):   68.6 ml 33.29 ml/m LA Biplane Vol: 65.3 ml 31.68 ml/m  AORTIC VALVE                    PULMONIC VALVE AV Area (Vmax):    2.95 cm     PV Vmax:       1.10 m/s AV Area (Vmean):   2.86 cm     PV Vmean:      68.400 cm/s AV Area (VTI):     2.53 cm     PV VTI:        0.146 m AV Vmax:           146.00 cm/s  PV Peak grad:  4.8 mmHg AV Vmean:          103.000 cm/s PV Mean grad:  2.0 mmHg AV VTI:            0.213 m AV Peak Grad:      8.5 mmHg AV Mean Grad:      5.0 mmHg LVOT Vmax:         95.30 cm/s LVOT Vmean:        65.200 cm/s LVOT VTI:          0.119 m LVOT/AV VTI ratio: 0.56 AI PHT:            281 msec  AORTA Ao Root diam: 3.00 cm MITRAL VALVE MV Area (PHT): 3.92 cm     SHUNTS MV Peak grad:  7.5 mmHg     Systemic VTI:  0.12 m MV Mean grad:  4.0 mmHg     Systemic Diam: 2.40 cm MV Vmax:       1.37 m/s MV Vmean:      92.7 cm/s MV Decel Time: 194 msec MV E velocity: 104.80 cm/s Kathlyn Sacramento MD Electronically signed by Kathlyn Sacramento MD Signature Date/Time: 03/04/2019/2:46:43 PM    Final       Assessment & Plan: Pt is a 64 y.o.   male with hypertension, hyperlipidemia, diabetes, depression, anxiety, adenocarcinoma of the lung stage IV,  coronary disease, history of CABG, tobacco and alcohol abuse, left pleural effusion, was admitted on 03/03/2019 with DKA (diabetic ketoacidoses) ([place]) [E11.10] New onset atrial fibrillation ([place]) [I48.91] Atrial fibrillation with rapid ventricular response ([place]) [I48.91] Diabetic ketoacidosis without coma associated with other specified diabetes mellitus ([place]) [E13.10]  #Acute kidney injury Baseline creatinine of 0.7 on February 5.  Today's creatinine has increased to 3.16.  Patient did have IV contrast exposure on January 19. Currently he is nonoliguric with worsening renal parameters Renal imaging: Kidney ultrasound March 1: Normal kidneys Acute kidney injury is likely secondary to ATN caused by hypotension, hemodynamic instability. Avoid further hypotension Gentle IV hydration Supportive care No acute indication for dialysis at present but may need this  admission  #Metabolic acidosis Today's bicarb level 15 Can consider changing IV fluids to sodium bicarbonate supplementation  #Hyponatremia Likely multifactorial with contribution from acute kidney injury and also possibility of liver disease from reported alcohol abuse   #Diabetic ketoacidosis, poorly controlled diabetes Management as per ICU team Lab Results  Component Value Date   HGBA1C 11.3 (H) 03/04/2019   Overall prognosis appears to be poor due to multiple underlying chronic illnesses, poorly controlled diabetes, reported history of tobacco and alcohol abuse, with underlying coronary disease, history of CABG, CHF with EF 30 to 35%, stage IV adenocarcinoma    LOS: 2 Adalina Dopson 3/2/20218:20 AM    Note: This note was prepared with Dragon dictation. Any transcription errors are unintentional

## 2019-03-05 NOTE — Consult Note (Signed)
ANTICOAGULATION CONSULT NOTE  Pharmacy Consult for Heparin  Indication: atrial fibrillation- new onset  Patient Measurements: Height: 6' (182.9 cm) Weight: 184 lb 15.5 oz (83.9 kg) IBW/kg (Calculated) : 77.6 Heparin Dosing Weight: 86.2 kg   Vital Signs: Temp: 97.9 F (36.6 C) (03/02 0000) Temp Source: Oral (03/02 0000) BP: 95/68 (03/02 0100) Pulse Rate: 106 (03/02 0100)  Labs: Recent Labs    03/03/19 1547 03/03/19 1547 03/03/19 1750 03/03/19 1936 03/03/19 2359 03/03/19 2359 03/04/19 0526 03/04/19 0526 03/04/19 1059 03/04/19 1849 03/05/19 0153  HGB  --    < >  --   --  12.2*   < >  --   --  12.2*  --  11.1*  HCT  --    < >  --   --  36.2*  --   --   --  34.2*  --  31.1*  PLT  --    < >  --   --  192  --   --   --  210  --  189  APTT  --   --  85*  --   --   --   --   --   --   --   --   LABPROT  --   --  14.6  --   --   --   --   --   --   --   --   INR  --   --  1.2  --   --   --   --   --   --   --   --   HEPARINUNFRC  --   --   --   --  0.26*   < > 0.31   < > 0.24* 0.28* 0.33  CREATININE 1.60*   < > 1.79*   < > 1.98*  --  1.88*  --  2.13*  --   --   TROPONINIHS 13,356*  --  >27,000*  --   --   --   --   --   --   --   --    < > = values in this interval not displayed.    Estimated Creatinine Clearance: 39 mL/min (A) (by C-G formula based on SCr of 2.13 mg/dL (H)).   Medications:  Called RN to confirm whether pt has been on [facility] prior to admission. Nurse was not able to confirm as she was not in the patient's room. Per chart review, no AC prior to admission and confirmed with pharmacy technician who spoke to patient.   Heparin Course: 02/28 initiation: 1150 units/hr 02/28 2350 HL 0.26: inc to 1300 units/hr 03/01 0530 HL 0.31: no change 03/01 1059 HL 0.24: inc to 1450 units/hr 03/01 1849 HL 0.28: inc to 1550 units/hr  Assessment: Craig Koch is a 64 y.o. male with a past medical history of anxiety, CAD, diabetes, and hyperlipidemia, hypertension, lung  cancer, presents to the emergency department for shortness of breath. Pharmacy was consulted for heparin dosing in a patient with new onset atrial fibrillation. H&H, platelets are stable  Goal of Therapy:  Heparin level 0.3-0.7 units/ml Monitor platelets by anticoagulation protocol: Yes   Plan:  3/1 1849 HL 0.28 slightly subtherapeutic. Increase heparin drip to 1550 units/hr. HL 3/2 at 0200. CBC daily while on heparin drip.  3/2 0153 HL 0.33, therapeutic x 1.  H/H worse, PLTs OK.  Continue heparin drip at same rate and recheck HL at 0800  to confirm.  Hart Robinsons, PharmD Clinical Pharmacist 03/05/2019,2:48 AM

## 2019-03-05 NOTE — Consult Note (Signed)
ANTICOAGULATION CONSULT NOTE  Pharmacy Consult for Heparin  Indication: atrial fibrillation- new onset  Patient Measurements: Height: 6' (182.9 cm) Weight: 184 lb 15.5 oz (83.9 kg) IBW/kg (Calculated) : 77.6 Heparin Dosing Weight: 86.2 kg   Vital Signs: Temp: 98 F (36.7 C) (03/02 0400) Temp Source: Axillary (03/02 0400) BP: 107/66 (03/02 0600) Pulse Rate: 98 (03/02 0600)  Labs: Recent Labs    03/03/19 1547 03/03/19 1547 03/03/19 1750 03/03/19 1936 03/03/19 2359 03/03/19 2359 03/04/19 0526 03/04/19 0526 03/04/19 1059 03/04/19 1849 03/05/19 0153  HGB  --    < >  --   --  12.2*   < >  --   --  12.2*  --  11.1*  HCT  --    < >  --   --  36.2*  --   --   --  34.2*  --  31.1*  PLT  --    < >  --   --  192  --   --   --  210  --  189  APTT  --   --  85*  --   --   --   --   --   --   --   --   LABPROT  --   --  14.6  --   --   --   --   --   --   --   --   INR  --   --  1.2  --   --   --   --   --   --   --   --   HEPARINUNFRC  --   --   --   --  0.26*   < > 0.31   < > 0.24* 0.28* 0.33  CREATININE 1.60*   < > 1.79*   < > 1.98*   < > 1.88*  --  2.13*  --  3.16*  TROPONINIHS 13,356*  --  >27,000*  --   --   --   --   --   --   --   --    < > = values in this interval not displayed.    Estimated Creatinine Clearance: 26.3 mL/min (A) (by C-G formula based on SCr of 3.16 mg/dL (H)).   Medications:  Called RN to confirm whether pt has been on [facility] prior to admission. Nurse was not able to confirm as she was not in the patient's room. Per chart review, no AC prior to admission and confirmed with pharmacy technician who spoke to patient.   Heparin Course: 02/28 initiation: 1150 units/hr 02/28 2350 HL 0.26: inc to 1300 units/hr 03/01 0530 HL 0.31: no change 03/01 1059 HL 0.24: inc to 1450 units/hr 03/01 1849 HL 0.28: inc to 1550 units/hr 03/02 0153 HL 0.33: no change 03/02 0758 HL 0.28: inc to 1700 units/hr  Assessment: Craig Koch is a 65 y.o. male with a past medical  history of anxiety, CAD, diabetes, and hyperlipidemia, hypertension, lung cancer, presents to the emergency department for shortness of breath. Pharmacy was consulted for heparin dosing in a patient with new onset atrial fibrillation. H&H trending down slightly, platelets are stable  Goal of Therapy:  Heparin level 0.3-0.7 units/ml Monitor platelets by anticoagulation protocol: Yes   Plan:   Heparin level subtherapeutic: increase heparin rate to 1700 units/hr  Re-check heparin level at 1800  Repeat CBC in am  Vallery Sa, PharmD Clinical Pharmacist 03/05/2019,7:18 AM

## 2019-03-05 NOTE — Consult Note (Signed)
Pharmacy Antibiotic Note  Craig Koch is a 64 y.o. male admitted on 03/03/2019 with medical history significant ofhypertension, hyperlipidemia, diabetes mellitus, depression, anxiety, lung cancer, CAD, CABG, tobacco abuse, alcohol abuse, left pleural effusion,who presents with new onset atrial fibillation and now with possible sepsis. Pharmacy has been consulted for vancomycin and Zosyn dosing.  Plan: 1) start Zosyn 3.375g IV q8h (4 hour infusion)  2) Vancomycin 1500 mg IV x 1   Given worsening renal function I will order a random vancomycin level in the morning to guide clearance and further dosing  BMP in am  Height: 6' (182.9 cm) Weight: 184 lb 15.5 oz (83.9 kg) IBW/kg (Calculated) : 77.6  Temp (24hrs), Avg:97.9 F (36.6 C), Min:97.7 F (36.5 C), Max:98.1 F (36.7 C)  Recent Labs  Lab 03/03/19 1330 03/03/19 1547 03/03/19 1936 03/03/19 2359 03/04/19 0526 03/04/19 1059 03/05/19 0153  WBC 9.4  --   --  9.9  --  8.5 8.5  CREATININE  --    < > 1.79* 1.98* 1.88* 2.13* 3.16*   < > = values in this interval not displayed.    Estimated Creatinine Clearance: 26.3 mL/min (A) (by C-G formula based on SCr of 3.16 mg/dL (H)).    Antimicrobials this admission: vancomycin 3/2 >>  Zosyn 3/2 >>   Microbiology results: 3/2 BCx: pending 2/28 SARS CoV-2: negative  2/28 influenza A/B: negative 2/28 MRSA PCR: negative  Thank you for allowing pharmacy to be a part of this patient's care.  Dallie Piles 03/05/2019 2:10 PM

## 2019-03-05 NOTE — Progress Notes (Signed)
L:ung Cancer Stage 4

## 2019-03-05 NOTE — Progress Notes (Addendum)
Inpatient Diabetes Program Recommendations  AACE/ADA: New Consensus Statement on Inpatient Glycemic Control   Target Ranges:  Prepandial:   less than 140 mg/dL      Peak postprandial:   less than 180 mg/dL (1-2 hours)      Critically ill patients:  140 - 180 mg/dL  Results for Craig Koch, Craig Koch (MRN [id number]) as of 03/05/2019 07:50  Ref. Range 03/05/2019 03:57 03/05/2019 07:19  Glucose-Capillary Latest Ref Range: 70 - 99 mg/dL 281 (H) 216 (H)   Results for [name], [name] (MRN [id number]) as of 03/05/2019 07:50  Ref. Range 03/04/2019 09:19 03/04/2019 09:57 03/04/2019 10:55 03/04/2019 11:43 03/04/2019 12:55 03/04/2019 15:31 03/04/2019 20:15 03/04/2019 23:30  Glucose-Capillary Latest Ref Range: 70 - 99 mg/dL 159 (H) 160 (H) 157 (H) 117 (H) 170 (H) 301 (H) 293 (H) 300 (H)   Review of Glycemic Control Diabetes history: DM2 Outpatient Diabetes medications: Levemir 30 QAM, Humalog 20 units TID with meals (notes he skips Humalog sometimes), Metformin 500 mg BID Current orders for Inpatient glycemic control: Lantus 20 units Q24H, Novolog 0-15 units Q4H  Inpatient Diabetes Program Recommendations:   Insulin-Please consider increasing Lantus to 35 units Q24H.  Thanks, Barnie Alderman, RN, MSN, CDE Diabetes Coordinator Inpatient Diabetes Program [phone] (Team Pager from 8am to 5pm)

## 2019-03-05 NOTE — Progress Notes (Signed)
PROGRESS NOTE    [name]  IWP:[id number] DOB: Apr 11, 1955 DOA: 03/03/2019 PCP: Tonia Ghent, MD   Brief Narrative:  HPI: Craig Koch is a 64 y.o. male with medical history significant of hypertension, hyperlipidemia, diabetes mellitus, depression, anxiety, lung cancer, CAD, CABG, tobacco abuse, alcohol abuse, left pleural effusion, who presents with shortness of breath and generalized weakness  Patient states that he has been having shortness of breath, dry cough and generalized weakness the past several days, which has worsened today.  Denies chest pain, fever or chills.  He states that he normally has O2 saturation at about the 90s.  Patient is using accessory muscle for breathing.  Denies nausea, no vomiting, diarrhea, abdominal pain.  Denies symptoms of UTI or unilateral weakness.  No recent fall.  No rectal bleeding or dark stool.  3/1: Patient seen and examined.  Very hard of hearing but mumbling to himself well rather incoherently.  Is able to tell me his name on repeat prompting although very difficult to understand.  DKA resolving.  Heart rate improved after initiation of amiodarone infusion per cardiology recommendations.  Weaned off BiPAP to nasal cannula.  Still tachypneic and encephalopathic.  Appears in mild distress.  3/2: Patient seen and examined.  Remains encephalopathic.  DKA resolved.  Heart rate improved on amiodarone infusion.  Procalcitonin markedly elevated.  Patient kidney function worsening.  Patient has been an uric over interval.  Suspected etiology ATN secondary to hypotension in setting of acute illness.  On nasal cannula.  Wife is at bedside today.  Discussed overall patient status with her as well as oncology consulted Dr. Tasia Catchings.   Assessment & Plan:   Principal Problem:   Atrial fibrillation with RVR ([place]) Active Problems:   HLD (hyperlipidemia)   Essential hypertension   History of alcohol use   CAD (coronary artery disease), native coronary  artery   lung adenocarcinoma   Tobacco abuse   Uncontrolled type 2 diabetes mellitus with hyperglycemia (H[facility])   DKA (diabetic ketoacidoses) (H[facility])   Hypothermia   SOB (shortness of breath)   AKI (acute kidney injury) ([place])   NSTEMI (non-ST elevated myocardial infarction) ([place])   Acute alteration in mental status   Palliative care encounter  Possible occult infection/bacteremia Markedly elevated procalcitonin Could be representative occult bacteremia We will take blood cultures Start empiric vancomycin and Zosyn De-escalate quickly if no obvious signs of bacterial infection  New onset atrial fibrillation with RVR ([place]) Patient does not have history of atrial fibrillation.   Today patient developed atrial fibrillation with RVR, heart rate up to 140s.   Currently hemodynamically stable.  Denies chest pain. Cardiology consulted from admission Patient was initially on Cardizem infusion Switched to amiodarone per cardiology recommendations Started on IV heparin for anticoagulation New systolic dysfunction secondary to NSTEMI Plan: Continue amiodarone infusion Continue home metoprolol Continue IV heparin Follow-up any further cardiology recommendations  Anuric kidney failure Etiology not entirely clear Initially working diagnosis was intravascular volume depletion secondary to diarrhea ketoacidosis Despite aggressive fluid resuscitation kidney function has worsened over interval Kidney function was normal as of 02/08/2019 Nephrology on board Renal ultrasound normal Plan: Gentle IV fluids Avoid nephrotoxins If kidney function continues to worsen patient may require dialysis during his hospital admission  Acute NSTEMI Acute systolic heart failure, EF 30 to 35% Troponins markedly elevated NSTEMI in the setting of respiratory failure and DKA Heart failure lack of Lee secondary to acute ischemic event Plan: On heparin infusion On metoprolol Cardiology recommending consideration  for ischemic evaluation during this admission Currently on hold pending resolution of acute issues  DKA (diabetic ketoacidoses), resolved Severe DKA with elevated blood glucose and pH less than 6.9 Has corrected with insulin GTT Anion gap closed now Plan: 35 units of Lantus NovoLog 0 to 15 units every 4 hours Carb controlled diet when able to tolerate p.o. Diabetes coordinator following, recommendations appreciated  Acute toxic metabolic encephalopathy Etiology not entirely clear Could potentially be infectious though no clear infectious focus identified To be secondary to hypoxia though improved since admission Could be metabolic secondary to severe diabetic ketoacidosis though mental status does not appear to be improving despite correction of acidosis Could be secondary to worsening brain metastases Could also be element of withdrawal.  Patient supposedly quit drinking over 3 months ago however clinical presentation could be consistent with acute withdrawal Plan: On empiric antibiotics Monitor patient, frequent reorientation Correct hypoxia and acidosis and reevaluate Patient will need brain MRI later during hospitalization once acute issues improved  SOB (shortness of breath) Etiology is not clear.   Patient has elevated BNP 330, but no history of congestive heart failure.   2D echo 08/24/2018 showed EF 55-60%.  Clinically patient is dry.  Denies chest pain.   Low suspicions for PE, though cannot rule out this possibility completely.   Most likely explanation is due to to DKA and atrial fibrillation Plan: -Treat patient with bronchodilators. -Observe closely.  If patient does not improve with control for DKA and atrial fibrillation, may consider to do PE work-up. -He is already on heparin GTT in the setting of possible PE  Lung adenocarcinoma stage IV. Following with oncologist Dr.Yu on Dabrafenib Oncology consulted, recommendations appreciated Discussed case with Dr.  Tasia Catchings Patient had a favorable response to last round of chemotherapy Per oncology request will order CT head  HLD (hyperlipidemia):  -Crestor  CAD (coronary artery disease), native coronary artery:  No CP -on Crestor  Tobacco abuse and Alcohol abuse: -Nicotine patch -CIWA protocol  Uncontrolled type 2 diabetes mellitus with hyperglycemia: Most recent A1c 10.6, poorly controled. Patient is taking Metformin, Humalog, Levemir at home -Currently on DKA protocol  Hypothermia, resolved -Bair hugger prn   DVT prophylaxis: Heparin GTT Code Status: DO NOT INTUBATE, DO NOT RESUSCITATE Family Communication: Wife at bedside, 03/05/2019 Disposition Plan: Unclear at this time, patient has multiple severe acute medical issues including NSTEMI, uncontrolled diabetes, severe metabolic encephalopathy.  These issues require monitoring prior to formulation of any disposition plan.  Consultants:   ICU  Cardiology  Oncology  Palliative care  Nephrology  Procedures:  BiPAP  Antimicrobials:   none   Subjective: Patient examined Remains tachypneic Remains encephalopathic On nasal cannula Wife is at bedside  Objective: Vitals:   03/05/19 1000 03/05/19 1100 03/05/19 1200 03/05/19 1422  BP: (!) 128/54 108/64 121/60   Pulse: 63 (!) 58 (!) 118 (!) 115  Resp: (!) 29 (!) 38 (!) 35 (!) 30  Temp:   97.7 F (36.5 C)   TempSrc:      SpO2: 96% 97% 99% 93%  Weight:      Height:        Intake/Output Summary (Last 24 hours) at 03/05/2019 1432 Last data filed at 03/05/2019 1147 Gross per 24 hour  Intake 1055.02 ml  Output 850 ml  Net 205.02 ml   Filed Weights   03/03/19 1328 03/03/19 2000  Weight: 86.2 kg 83.9 kg    Examination:  General exam: Appears in mild respiratory distress.  Appears chronically  ill Respiratory system: Scattered crackles bilaterally.  Tachypneic Cardiovascular system: S1-S2 heard.  Irregular rhythm.  No appreciable murmurs gastrointestinal system: Abdomen is  nondistended, soft and nontender. No organomegaly or masses felt. Normal bowel sounds heard. Central nervous system: Encephalopathic, no obvious focal deficits Extremities: Symmetric 5 x 5 power. Skin: No rashes, lesions or ulcers Psychiatry: Encephalopathic    Data Reviewed: I have personally reviewed following labs and imaging studies  CBC: Recent Labs  Lab 03/03/19 1330 03/03/19 2359 03/04/19 1059 03/05/19 0153  WBC 9.4 9.9 8.5 8.5  NEUTROABS 7.3  --   --   --   HGB 13.4 12.2* 12.2* 11.1*  HCT 42.9 36.2* 34.2* 31.1*  MCV 87.4 82.1 77.0* 77.8*  PLT 232 192 210 544   Basic Metabolic Panel: Recent Labs  Lab 03/03/19 1750 03/03/19 1750 03/03/19 1936 03/03/19 2359 03/04/19 0526 03/04/19 1059 03/05/19 0153  NA 123*   < > 124* 127* 127* 127* 128*  K 5.4*   < > 4.9 3.5 3.5 3.7 3.5  CL 89*   < > 92* 96* 99 102 102  CO2 8*   < > 8* 13* 14* 14* 15*  GLUCOSE 756*   < > 682* 440* 177* 134* 311*  BUN 43*   < > 45* 48* 53* 53* 65*  CREATININE 1.79*   < > 1.79* 1.98* 1.88* 2.13* 3.16*  CALCIUM 8.2*   < > 8.2* 8.0* 8.1* 8.0* 8.0*  MG 2.3  --   --   --   --   --  1.9  PHOS  --   --   --   --   --   --  1.4*   < > = values in this interval not displayed.   GFR: Estimated Creatinine Clearance: 26.3 mL/min (A) (by C-G formula based on SCr of 3.16 mg/dL (H)). Liver Function Tests: Recent Labs  Lab 03/03/19 1547 03/03/19 1750  AST 81* 136*  ALT 18 25  ALKPHOS 80 104  BILITOT 2.3* 2.4*  PROT 6.6 7.5  ALBUMIN 2.9* 3.2*   No results for input(s): LIPASE, AMYLASE in the last 168 hours. No results for input(s): AMMONIA in the last 168 hours. Coagulation Profile: Recent Labs  Lab 03/03/19 1750  INR 1.2   Cardiac Enzymes: No results for input(s): CKTOTAL, CKMB, CKMBINDEX, TROPONINI in the last 168 hours. BNP (last 3 results) No results for input(s): PROBNP in the last 8760 hours. HbA1C: Recent Labs    03/04/19 1059  HGBA1C 11.3*   CBG: Recent Labs  Lab  03/04/19 2015 03/04/19 2330 03/05/19 0357 03/05/19 0719 03/05/19 1119  GLUCAP 293* 300* 281* 216* 178*   Lipid Profile: Recent Labs    03/04/19 1059  CHOL 138  HDL 15*  LDLCALC 81  TRIG 209*  CHOLHDL 9.2   Thyroid Function Tests: Recent Labs    03/04/19 0526  TSH 3.184   Anemia Panel: No results for input(s): VITAMINB12, FOLATE, FERRITIN, TIBC, IRON, RETICCTPCT in the last 72 hours. Sepsis Labs: Recent Labs  Lab 03/04/19 1059 03/05/19 0153  PROCALCITON 19.38 14.44    Recent Results (from the past 240 hour(s))  Respiratory Panel by RT PCR (Flu A&B, Covid) - Nasopharyngeal Swab     Status: None   Collection Time: 03/03/19  3:31 PM   Specimen: Nasopharyngeal Swab  Result Value Ref Range Status   SARS Coronavirus 2 by RT PCR NEGATIVE NEGATIVE Final    Comment: (NOTE) SARS-CoV-2 target nucleic acids are NOT DETECTED. The SARS-CoV-2  RNA is generally detectable in upper respiratoy specimens during the acute phase of infection. The lowest concentration of SARS-CoV-2 viral copies this assay can detect is 131 copies/mL. A negative result does not preclude SARS-Cov-2 infection and should not be used as the sole basis for treatment or other patient management decisions. A negative result may occur with  improper specimen collection/handling, submission of specimen other than nasopharyngeal swab, presence of viral mutation(s) within the areas targeted by this assay, and inadequate number of viral copies (<131 copies/mL). A negative result must be combined with clinical observations, patient history, and epidemiological information. The expected result is Negative. Fact Sheet for Patients:  PinkCheek.be Fact Sheet for Healthcare Providers:  GravelBags.it This test is not yet ap proved or cleared by the [place] FDA and  has been authorized for detection and/or diagnosis of SARS-CoV-2 by FDA under an Emergency  Use Authorization (EUA). This EUA will remain  in effect (meaning this test can be used) for the duration of the COVID-19 declaration under Section 564(b)(1) of the Act, 21 U.S.C. section 360bbb-3(b)(1), unless the authorization is terminated or revoked sooner.    Influenza A by PCR NEGATIVE NEGATIVE Final   Influenza B by PCR NEGATIVE NEGATIVE Final    Comment: (NOTE) The Xpert Xpress SARS-CoV-2/FLU/RSV assay is intended as an aid in  the diagnosis of influenza from Nasopharyngeal swab specimens and  should not be used as a sole basis for treatment. Nasal washings and  aspirates are unacceptable for Xpert Xpress SARS-CoV-2/FLU/RSV  testing. Fact Sheet for Patients: PinkCheek.be Fact Sheet for Healthcare Providers: GravelBags.it This test is not yet approved or cleared by the [place] FDA and  has been authorized for detection and/or diagnosis of SARS-CoV-2 by  FDA under an Emergency Use Authorization (EUA). This EUA will remain  in effect (meaning this test can be used) for the duration of the  Covid-19 declaration under Section 564(b)(1) of the Act, 21  U.S.C. section 360bbb-3(b)(1), unless the authorization is  terminated or revoked. Performed at [facility], [address] [place], [place] [address]   MRSA PCR Screening     Status: None   Collection Time: 03/03/19  7:48 PM   Specimen: Nasopharyngeal  Result Value Ref Range Status   MRSA by PCR NEGATIVE NEGATIVE Final    Comment:        The GeneXpert MRSA Assay (FDA approved for NASAL specimens only), is one component of a comprehensive MRSA colonization surveillance program. It is not intended to diagnose MRSA infection nor to guide or monitor treatment for MRSA infections. Performed at [facility], [address], [place], [place] [address]          Radiology Studies: US RENAL  Result Date: 03/04/2019 CLINICAL DATA:  Acute  kidney injury EXAM: RENAL / URINARY TRACT ULTRASOUND COMPLETE COMPARISON:  CT 01/22/2019 FINDINGS: Right Kidney: Renal measurements: 11.4 x 6 x 6 cm = volume: 215 mL . Echogenicity within normal limits. No mass or hydronephrosis visualized. Left Kidney: Renal measurements: 11.8 x 7.3 x 6.5 cm = volume: 291.8 mL. Echogenicity within normal limits. No mass or hydronephrosis visualized. Bladder: Appears normal for degree of bladder distention. Other: Incidental note made of gallstones and small effusions. Heterogenous prostate. IMPRESSION: Normal ultrasound appearance of the kidneys Electronically Signed   By: Donavan Foil M.D.   On: 03/04/2019 15:37   ECHOCARDIOGRAM COMPLETE  Result Date: 03/04/2019    ECHOCARDIOGRAM REPORT   Patient Name:   Craig Koch Date  of Exam: 03/04/2019 Medical Rec #:  [id number]       Height:       72.0 in Accession #:    [id number]      Weight:       185.0 lb Date of Birth:  April 25, 1955       BSA:          2.061 m Patient Age:    76 years        BP:           125/72 mmHg Patient Gender: M               HR:           119 bpm. Exam Location:  [facility] Procedure: 2D Echo, Color Doppler and Cardiac Doppler Indications:     I21.4 NSTEMI  History:         Patient has prior history of Echocardiogram examinations, most                  recent 08/24/2018. CAD, Prior CABG; Risk Factors:Hypertension,                  Dyslipidemia and Diabetes. Lung Cancer.  Sonographer:     Charmayne Sheer RDCS (AE) Referring Phys:  [place] Phys: Kathlyn Sacramento MD IMPRESSIONS  1. Left ventricular ejection fraction, by estimation, is 30 to 35%. The left ventricle has moderately decreased function. The left ventricle demonstrates global hypokinesis. Left ventricular diastolic parameters are indeterminate.  2. Right ventricular systolic function is normal. The right ventricular size is normal. Tricuspid regurgitation signal is inadequate for assessing PA pressure.  3. Left atrial size was mild to  moderately dilated.  4. The mitral valve is normal in structure and function. Moderate mitral valve regurgitation. No evidence of mitral stenosis.  5. The aortic valve is normal in structure and function. Aortic valve regurgitation is trivial. Mild to moderate aortic valve sclerosis/calcification is present, without any evidence of aortic stenosis.  6. The inferior vena cava is normal in size with greater than 50% respiratory variability, suggesting right atrial pressure of 3 mmHg. FINDINGS  Left Ventricle: Left ventricular ejection fraction, by estimation, is 30 to 35%. The left ventricle has moderately decreased function. The left ventricle demonstrates global hypokinesis. The left ventricular internal cavity size was normal in size. There is no left ventricular hypertrophy. Left ventricular diastolic parameters are indeterminate. Right Ventricle: The right ventricular size is normal. No increase in right ventricular wall thickness. Right ventricular systolic function is normal. Tricuspid regurgitation signal is inadequate for assessing PA pressure. Left Atrium: Left atrial size was mild to moderately dilated. Right Atrium: Right atrial size was normal in size. Pericardium: There is no evidence of pericardial effusion. Mitral Valve: The mitral valve is normal in structure and function. Normal mobility of the mitral valve leaflets. Moderate mitral valve regurgitation. No evidence of mitral valve stenosis. MV peak gradient, 7.5 mmHg. The mean mitral valve gradient is 4.0  mmHg. Tricuspid Valve: The tricuspid valve is normal in structure. Tricuspid valve regurgitation is not demonstrated. No evidence of tricuspid stenosis. Aortic Valve: The aortic valve is normal in structure and function. Aortic valve regurgitation is trivial. Aortic regurgitation PHT measures 281 msec. Mild to moderate aortic valve sclerosis/calcification is present, without any evidence of aortic stenosis. Aortic valve mean gradient measures 5.0  mmHg. Aortic valve peak gradient measures 8.5 mmHg. Aortic valve area, by VTI measures 2.53 cm. Pulmonic Valve: The pulmonic valve was  normal in structure. Pulmonic valve regurgitation is not visualized. No evidence of pulmonic stenosis. Aorta: The aortic root is normal in size and structure. Venous: The inferior vena cava is normal in size with greater than 50% respiratory variability, suggesting right atrial pressure of 3 mmHg. IAS/Shunts: No atrial level shunt detected by color flow Doppler.  LEFT VENTRICLE PLAX 2D LVIDd:         5.21 cm  Diastology LVIDs:         4.21 cm  LV e' lateral:   7.83 cm/s LV PW:         0.84 cm  LV E/e' lateral: 13.4 LV IVS:        0.89 cm  LV e' medial:    7.94 cm/s LVOT diam:     2.40 cm  LV E/e' medial:  13.2 LV SV:         54 LV SV Index:   26 LVOT Area:     4.52 cm  RIGHT VENTRICLE RV Basal diam:  3.08 cm LEFT ATRIUM             Index       RIGHT ATRIUM           Index LA diam:        4.70 cm 2.28 cm/m  RA Area:     14.10 cm LA Vol (A2C):   62.0 ml 30.08 ml/m RA Volume:   35.20 ml  17.08 ml/m LA Vol (A4C):   68.6 ml 33.29 ml/m LA Biplane Vol: 65.3 ml 31.68 ml/m  AORTIC VALVE                    PULMONIC VALVE AV Area (Vmax):    2.95 cm     PV Vmax:       1.10 m/s AV Area (Vmean):   2.86 cm     PV Vmean:      68.400 cm/s AV Area (VTI):     2.53 cm     PV VTI:        0.146 m AV Vmax:           146.00 cm/s  PV Peak grad:  4.8 mmHg AV Vmean:          103.000 cm/s PV Mean grad:  2.0 mmHg AV VTI:            0.213 m AV Peak Grad:      8.5 mmHg AV Mean Grad:      5.0 mmHg LVOT Vmax:         95.30 cm/s LVOT Vmean:        65.200 cm/s LVOT VTI:          0.119 m LVOT/AV VTI ratio: 0.56 AI PHT:            281 msec  AORTA Ao Root diam: 3.00 cm MITRAL VALVE MV Area (PHT): 3.92 cm     SHUNTS MV Peak grad:  7.5 mmHg     Systemic VTI:  0.12 m MV Mean grad:  4.0 mmHg     Systemic Diam: 2.40 cm MV Vmax:       1.37 m/s MV Vmean:      92.7 cm/s MV Decel Time: 194 msec MV E velocity: 104.80  cm/s Kathlyn Sacramento MD Electronically signed by Kathlyn Sacramento MD Signature Date/Time: 03/04/2019/2:46:43 PM    Final         Scheduled Meds: . aspirin EC  325 mg Oral  Daily  . chlorhexidine  15 mL Mouth Rinse BID  . Chlorhexidine Gluconate Cloth  6 each Topical Daily  . dabrafenib mesylate  150 mg Oral BID  . dextromethorphan-guaiFENesin  1 tablet Oral BID  . dronabinol  5 mg Oral BID AC  . DULoxetine  30 mg Oral Daily  . insulin aspart  0-15 Units Subcutaneous Q4H  . insulin glargine  35 Units Subcutaneous Q24H  . ipratropium-albuterol  3 mL Nebulization Q6H  . LORazepam  0-4 mg Intravenous Q6H   Followed by  . LORazepam  0-4 mg Intravenous Q12H  . mouth rinse  15 mL Mouth Rinse q12n4p  . metoprolol tartrate  25 mg Oral BID  . multivitamin with minerals  1 tablet Oral Daily  . nicotine  21 mg Transdermal Daily  . rosuvastatin  10 mg Oral Daily  . trametinib dimethyl sulfoxide  2 mg Oral Daily  . vancomycin variable dose per unstable renal function (pharmacist dosing)   Does not apply See admin instructions   Continuous Infusions: . amiodarone 30 mg/hr (03/05/19 1147)  . dextrose 5% lactated ringers 75 mL/hr at 03/04/19 2104  . diltiazem (CARDIZEM) infusion Stopped (03/03/19 2026)  . diltiazem (CARDIZEM) infusion 10 mg/hr (03/03/19 1815)  . heparin 1,700 Units/hr (03/05/19 1115)  . piperacillin-tazobactam (ZOSYN)  IV    . small volume/piggyback builder 50 mL/hr at 03/05/19 1113  . vancomycin 1,500 mg (03/05/19 1237)     LOS: 2 days    Time spent: 35 minutes    Sidney Ace, MD [facility] Pager [phone]  If 7PM-7AM, please contact night-coverage 03/05/2019, 2:32 PM

## 2019-03-05 NOTE — Progress Notes (Signed)
Progress Note  Patient Name: Craig Koch Date of Encounter: 03/05/2019  Primary Cardiologist: Arvid Right  Subjective   Lethargic, minimally conversant, will respond to stimuli, dyspneic Will not open his eyes Poor p.o. intake  Inpatient Medications    Scheduled Meds: . aspirin EC  325 mg Oral Daily  . chlorhexidine  15 mL Mouth Rinse BID  . Chlorhexidine Gluconate Cloth  6 each Topical Daily  . dabrafenib mesylate  150 mg Oral BID  . dextromethorphan-guaiFENesin  1 tablet Oral BID  . dronabinol  5 mg Oral BID AC  . DULoxetine  30 mg Oral Daily  . insulin aspart  0-15 Units Subcutaneous Q4H  . insulin glargine  35 Units Subcutaneous Q24H  . ipratropium-albuterol  3 mL Nebulization Q6H  . LORazepam  0-4 mg Intravenous Q6H   Followed by  . LORazepam  0-4 mg Intravenous Q12H  . mouth rinse  15 mL Mouth Rinse q12n4p  . metoprolol tartrate  25 mg Oral BID  . multivitamin with minerals  1 tablet Oral Daily  . nicotine  21 mg Transdermal Daily  . rosuvastatin  10 mg Oral Daily  . trametinib dimethyl sulfoxide  2 mg Oral Daily  . vancomycin variable dose per unstable renal function (pharmacist dosing)   Does not apply See admin instructions   Continuous Infusions: . amiodarone 30 mg/hr (03/05/19 1147)  . dextrose 5% lactated ringers 75 mL/hr (03/05/19 1535)  . diltiazem (CARDIZEM) infusion Stopped (03/03/19 2026)  . diltiazem (CARDIZEM) infusion 10 mg/hr (03/03/19 1815)  . heparin 1,700 Units/hr (03/05/19 1115)  . piperacillin-tazobactam (ZOSYN)  IV    . small volume/piggyback builder 50 mL/hr at 03/05/19 1113   PRN Meds: acetaminophen, albuterol, dextrose, LORazepam **OR** LORazepam, ondansetron (ZOFRAN) IV, pneumococcal 23 valent vaccine   Vital Signs    Vitals:   03/05/19 1000 03/05/19 1100 03/05/19 1200 03/05/19 1422  BP: (!) 128/54 108/64 121/60   Pulse: 63 (!) 58 (!) 118 (!) 115  Resp: (!) 29 (!) 38 (!) 35 (!) 30  Temp:   97.7 F (36.5 C)   TempSrc:        SpO2: 96% 97% 99% 93%  Weight:      Height:        Intake/Output Summary (Last 24 hours) at 03/05/2019 1608 Last data filed at 03/05/2019 1147 Gross per 24 hour  Intake 1055.02 ml  Output 850 ml  Net 205.02 ml   Last 3 Weights 03/03/2019 03/03/2019 02/14/2019  Weight (lbs) 184 lb 15.5 oz 190 lb 191 lb  Weight (kg) 83.9 kg 86.183 kg 86.637 kg      Telemetry    Atrial fibrillation rate 100 to 110 bpm- Personally Reviewed  ECG     - Personally Reviewed  Physical Exam   GEN:  Moderate distress, tachypnea Neck:  Unable to estimate JVD Cardiac:  Irregularly irregular no murmurs, rubs, or gallops.  Respiratory:  Coarse breath sounds bilaterally GI: Soft, nontender, non-distended  MS: No edema; No deformity. Neuro:  Nonfocal  Psych: Lethargic, difficult to arouse  Labs    High Sensitivity Troponin:   Recent Labs  Lab 03/03/19 1547 03/03/19 1750  TROPONINIHS 13,356* >27,000*      Chemistry Recent Labs  Lab 03/03/19 1547 03/03/19 1547 03/03/19 1750 03/03/19 1936 03/04/19 0526 03/04/19 1059 03/05/19 0153  NA 123*   < > 123*   < > 127* 127* 128*  K 5.1   < > 5.4*   < > 3.5 3.7 3.5  CL 89*   < > 89*   < > 99 102 102  CO2 <7*   < > 8*   < > 14* 14* 15*  GLUCOSE 718*   < > 756*   < > 177* 134* 311*  BUN 37*   < > 43*   < > 53* 53* 65*  CREATININE 1.60*   < > 1.79*   < > 1.88* 2.13* 3.16*  CALCIUM 8.2*   < > 8.2*   < > 8.1* 8.0* 8.0*  PROT 6.6  --  7.5  --   --   --   --   ALBUMIN 2.9*  --  3.2*  --   --   --   --   AST 81*  --  136*  --   --   --   --   ALT 18  --  25  --   --   --   --   ALKPHOS 80  --  104  --   --   --   --   BILITOT 2.3*  --  2.4*  --   --   --   --   GFRNONAA 45*   < > 39*   < > 37* 32* 20*  GFRAA 52*   < > 46*   < > 43* 37* 23*  ANIONGAP NOT CALCULATED   < > 26*   < > 14 11 11    < > = values in this interval not displayed.     Hematology Recent Labs  Lab 03/03/19 2359 03/04/19 1059 03/05/19 0153  WBC 9.9 8.5 8.5  RBC 4.41 4.44  4.00*  HGB 12.2* 12.2* 11.1*  HCT 36.2* 34.2* 31.1*  MCV 82.1 77.0* 77.8*  MCH 27.7 27.5 27.8  MCHC 33.7 35.7 35.7  RDW 16.7* 16.4* 17.1*  PLT 192 210 189    BNP Recent Labs  Lab 03/03/19 1330  BNP 330.0*     DDimer No results for input(s): DDIMER in the last 168 hours.   Radiology    CT HEAD WO CONTRAST  Result Date: 03/05/2019 CLINICAL DATA:  Metastatic disease evaluation.  Lung cancer. EXAM: CT HEAD WITHOUT CONTRAST TECHNIQUE: Contiguous axial images were obtained from the base of the skull through the vertex without intravenous contrast. COMPARISON:  MRI head 09/20/2018 FINDINGS: Brain: Mild atrophy. Mild hypodensity in the white matter unchanged from the prior MRI. No acute infarct, hemorrhage, mass. No edema or midline shift. Vascular: Negative for hyperdense vessel Skull: Negative skull. Sinuses/Orbits: Mucosal edema and bony thickening right maxillary sinus. Chronic nasal bone fracture. Left cataract extraction. Other: None IMPRESSION: No acute abnormality and negative for metastatic disease on unenhanced CT. Atrophy and mild chronic microvascular ischemia. Electronically Signed   By: Franchot Gallo M.D.   On: 03/05/2019 15:28   US RENAL  Result Date: 03/04/2019 CLINICAL DATA:  Acute kidney injury EXAM: RENAL / URINARY TRACT ULTRASOUND COMPLETE COMPARISON:  CT 01/22/2019 FINDINGS: Right Kidney: Renal measurements: 11.4 x 6 x 6 cm = volume: 215 mL . Echogenicity within normal limits. No mass or hydronephrosis visualized. Left Kidney: Renal measurements: 11.8 x 7.3 x 6.5 cm = volume: 291.8 mL. Echogenicity within normal limits. No mass or hydronephrosis visualized. Bladder: Appears normal for degree of bladder distention. Other: Incidental note made of gallstones and small effusions. Heterogenous prostate. IMPRESSION: Normal ultrasound appearance of the kidneys Electronically Signed   By: Donavan Foil M.D.   On: 03/04/2019 15:37   ECHOCARDIOGRAM COMPLETE  Result Date: 03/04/2019     ECHOCARDIOGRAM REPORT   Patient Name:   Craig Koch Date of Exam: 03/04/2019 Medical Rec #:  [id number]       Height:       72.0 in Accession #:    [id number]      Weight:       185.0 lb Date of Birth:  May 16, 1955       BSA:          2.061 m Patient Age:    64 years        BP:           125/72 mmHg Patient Gender: M               HR:           119 bpm. Exam Location:  [facility] Procedure: 2D Echo, Color Doppler and Cardiac Doppler Indications:     I21.4 NSTEMI  History:         Patient has prior history of Echocardiogram examinations, most                  recent 08/24/2018. CAD, Prior CABG; Risk Factors:Hypertension,                  Dyslipidemia and Diabetes. Lung Cancer.  Sonographer:     Charmayne Sheer RDCS (AE) Referring Phys:  [place] Phys: Kathlyn Sacramento MD IMPRESSIONS  1. Left ventricular ejection fraction, by estimation, is 30 to 35%. The left ventricle has moderately decreased function. The left ventricle demonstrates global hypokinesis. Left ventricular diastolic parameters are indeterminate.  2. Right ventricular systolic function is normal. The right ventricular size is normal. Tricuspid regurgitation signal is inadequate for assessing PA pressure.  3. Left atrial size was mild to moderately dilated.  4. The mitral valve is normal in structure and function. Moderate mitral valve regurgitation. No evidence of mitral stenosis.  5. The aortic valve is normal in structure and function. Aortic valve regurgitation is trivial. Mild to moderate aortic valve sclerosis/calcification is present, without any evidence of aortic stenosis.  6. The inferior vena cava is normal in size with greater than 50% respiratory variability, suggesting right atrial pressure of 3 mmHg. FINDINGS  Left Ventricle: Left ventricular ejection fraction, by estimation, is 30 to 35%. The left ventricle has moderately decreased function. The left ventricle demonstrates global hypokinesis. The left ventricular internal cavity  size was normal in size. There is no left ventricular hypertrophy. Left ventricular diastolic parameters are indeterminate. Right Ventricle: The right ventricular size is normal. No increase in right ventricular wall thickness. Right ventricular systolic function is normal. Tricuspid regurgitation signal is inadequate for assessing PA pressure. Left Atrium: Left atrial size was mild to moderately dilated. Right Atrium: Right atrial size was normal in size. Pericardium: There is no evidence of pericardial effusion. Mitral Valve: The mitral valve is normal in structure and function. Normal mobility of the mitral valve leaflets. Moderate mitral valve regurgitation. No evidence of mitral valve stenosis. MV peak gradient, 7.5 mmHg. The mean mitral valve gradient is 4.0  mmHg. Tricuspid Valve: The tricuspid valve is normal in structure. Tricuspid valve regurgitation is not demonstrated. No evidence of tricuspid stenosis. Aortic Valve: The aortic valve is normal in structure and function. Aortic valve regurgitation is trivial. Aortic regurgitation PHT measures 281 msec. Mild to moderate aortic valve sclerosis/calcification is present, without any evidence of aortic stenosis. Aortic valve mean gradient measures 5.0 mmHg. Aortic valve peak  gradient measures 8.5 mmHg. Aortic valve area, by VTI measures 2.53 cm. Pulmonic Valve: The pulmonic valve was normal in structure. Pulmonic valve regurgitation is not visualized. No evidence of pulmonic stenosis. Aorta: The aortic root is normal in size and structure. Venous: The inferior vena cava is normal in size with greater than 50% respiratory variability, suggesting right atrial pressure of 3 mmHg. IAS/Shunts: No atrial level shunt detected by color flow Doppler.  LEFT VENTRICLE PLAX 2D LVIDd:         5.21 cm  Diastology LVIDs:         4.21 cm  LV e' lateral:   7.83 cm/s LV PW:         0.84 cm  LV E/e' lateral: 13.4 LV IVS:        0.89 cm  LV e' medial:    7.94 cm/s LVOT diam:      2.40 cm  LV E/e' medial:  13.2 LV SV:         54 LV SV Index:   26 LVOT Area:     4.52 cm  RIGHT VENTRICLE RV Basal diam:  3.08 cm LEFT ATRIUM             Index       RIGHT ATRIUM           Index LA diam:        4.70 cm 2.28 cm/m  RA Area:     14.10 cm LA Vol (A2C):   62.0 ml 30.08 ml/m RA Volume:   35.20 ml  17.08 ml/m LA Vol (A4C):   68.6 ml 33.29 ml/m LA Biplane Vol: 65.3 ml 31.68 ml/m  AORTIC VALVE                    PULMONIC VALVE AV Area (Vmax):    2.95 cm     PV Vmax:       1.10 m/s AV Area (Vmean):   2.86 cm     PV Vmean:      68.400 cm/s AV Area (VTI):     2.53 cm     PV VTI:        0.146 m AV Vmax:           146.00 cm/s  PV Peak grad:  4.8 mmHg AV Vmean:          103.000 cm/s PV Mean grad:  2.0 mmHg AV VTI:            0.213 m AV Peak Grad:      8.5 mmHg AV Mean Grad:      5.0 mmHg LVOT Vmax:         95.30 cm/s LVOT Vmean:        65.200 cm/s LVOT VTI:          0.119 m LVOT/AV VTI ratio: 0.56 AI PHT:            281 msec  AORTA Ao Root diam: 3.00 cm MITRAL VALVE MV Area (PHT): 3.92 cm     SHUNTS MV Peak grad:  7.5 mmHg     Systemic VTI:  0.12 m MV Mean grad:  4.0 mmHg     Systemic Diam: 2.40 cm MV Vmax:       1.37 m/s MV Vmean:      92.7 cm/s MV Decel Time: 194 msec MV E velocity: 104.80 cm/s Kathlyn Sacramento MD Electronically signed by Kathlyn Sacramento MD Signature Date/Time: 03/04/2019/2:46:43 PM  Final     Cardiac Studies   Echo 1. Left ventricular ejection fraction, by estimation, is 30 to 35%. The  left ventricle has moderately decreased function. The left ventricle  demonstrates global hypokinesis. Left ventricular diastolic parameters are  indeterminate.  2. Right ventricular systolic function is normal. The right ventricular  size is normal. Tricuspid regurgitation signal is inadequate for assessing  PA pressure.  3. Left atrial size was mild to moderately dilated.  4. The mitral valve is normal in structure and function. Moderate mitral  valve regurgitation. No evidence of  mitral stenosis.  5. The aortic valve is normal in structure and function. Aortic valve  regurgitation is trivial. Mild to moderate aortic valve  sclerosis/calcification is present, without any evidence of aortic  stenosis.  6. The inferior vena cava is normal in size with greater than 50%  respiratory variability, suggesting right atrial pressure of 3 mmHg.   Patient Profile     Craig Koch is a 64 y.o. 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 Atrial fibrillation with RVR Timing of onset unclear Rapid rate on arrival, on diltiazem infusion, rate  130s Given digoxin IV 0.5x1 -On heparin infusion, started on amiodarone infusion -Diltiazem infusion has been held given hypotension -Might be able to tolerate diltiazem 30 mg every 8 with hold parameters  2.  Adenocarcinoma of lung stage IV Malignant pleural effusion on left, does not appear to need thoracentesis based on x-ray findings Prior thoracentesis x2 Followed by oncology had been on dabrafenib and trametinib prior to admission.   3. NSTEMI Markedly elevated troponin in the setting of atrial fibrillation with RVR, known CAD, CABG -Troponin greater than 27,000, on heparin infusion --- Ejection fraction 30 to 35% on echo Currently not a good candidate for ischemic work-up given severity of his condition, worsening renal failure, High risk of complications -We will follow for now  4.  Poorly controlled diabetes with complications Hemoglobin A1c greater than 10 Blood glucose 718 on arrival Much improved in follow-up  today, 178 to 200 on check  5.  Smoker Severe underlying COPD Long smoking history, was still smoking 1/2 pack/day 11 August 2018 Cessation recommended  6. Hyponatremia In setting of lung cancer, dehydration Continues to run low 128  7.  Acute on chronic renal failure Creatinine trending upwards, 3.16 BUN 65 Potassium is 3.5 Ultrasound no hydronephrosis, normal appearance of kidneys  Critically ill gentleman with numerous medical issues, case discussed with intensivist  Total encounter time more than 35 minutes  Greater than 50% was spent in counseling and coordination of care with the patient   For questions or updates, please contact [place] Please consult [email or web address] for contact info under        Signed, Ida Rogue, MD  03/05/2019, 4:08 PM

## 2019-03-05 NOTE — Consult Note (Signed)
ANTICOAGULATION CONSULT NOTE  Pharmacy Consult for Heparin  Indication: atrial fibrillation- new onset  Patient Measurements: Height: 6' (182.9 cm) Weight: 184 lb 15.5 oz (83.9 kg) IBW/kg (Calculated) : 77.6 Heparin Dosing Weight: 86.2 kg   Vital Signs: Temp: 98.2 F (36.8 C) (03/02 1615) BP: 122/69 (03/02 1700) Pulse Rate: 112 (03/02 1700)  Labs: Recent Labs    03/03/19 1547 03/03/19 1547 03/03/19 1750 03/03/19 1936 03/03/19 2359 03/03/19 2359 03/04/19 0526 03/04/19 0526 03/04/19 1059 03/04/19 1849 03/05/19 0153 03/05/19 0758 03/05/19 1743  HGB  --    < >  --   --  12.2*   < >  --   --  12.2*  --  11.1*  --   --   HCT  --    < >  --   --  36.2*  --   --   --  34.2*  --  31.1*  --   --   PLT  --    < >  --   --  192  --   --   --  210  --  189  --   --   APTT  --   --  85*  --   --   --   --   --   --   --   --   --   --   LABPROT  --   --  14.6  --   --   --   --   --   --   --   --   --   --   INR  --   --  1.2  --   --   --   --   --   --   --   --   --   --   HEPARINUNFRC  --   --   --   --  0.26*   < > 0.31   < > 0.24*   < > 0.33 0.28* 0.30  CREATININE 1.60*   < > 1.79*   < > 1.98*   < > 1.88*  --  2.13*  --  3.16*  --   --   TROPONINIHS 13,356*  --  >27,000*  --   --   --   --   --   --   --   --   --   --    < > = values in this interval not displayed.    Estimated Creatinine Clearance: 26.3 mL/min (A) (by C-G formula based on SCr of 3.16 mg/dL (H)).   Medications:  Called RN to confirm whether pt has been on [facility] prior to admission. Nurse was not able to confirm as she was not in the patient's room. Per chart review, no AC prior to admission and confirmed with pharmacy technician who spoke to patient.   Heparin Course: 02/28 initiation: 1150 units/hr 02/28 2350 HL 0.26: inc to 1300 units/hr 03/01 0530 HL 0.31: no change 03/01 1059 HL 0.24: inc to 1450 units/hr 03/01 1849 HL 0.28: inc to 1550 units/hr 03/02 0153 HL 0.33: no change 03/02 0758 HL 0.28: inc  to 1700 units/hr 03/02 1743 HL 0.30- therapeutic   Assessment: Craig Koch is a 64 y.o. male with a past medical history of anxiety, CAD, diabetes, and hyperlipidemia, hypertension, lung cancer, presents to the emergency department for shortness of breath. Pharmacy was consulted for heparin dosing in a patient with new onset atrial  fibrillation. H&H trending down slightly, platelets are stable  Goal of Therapy:  Heparin level 0.3-0.7 units/ml Monitor platelets by anticoagulation protocol: Yes   Plan:    Heparin Level 0.30 @ 1743. Level just barely therapeutic  Will increase infusion to 1750 units/hr  Re-check confirmatory  heparin level in 6 hours.   Repeat CBC in am per protocol   Pernell Dupre, PharmD, BCPS Clinical Pharmacist 03/05/2019 6:26 PM

## 2019-03-05 NOTE — Progress Notes (Signed)
Hematology/Oncology Progress Note [facility] [phone] [phone]  Patient Care Team: Tonia Ghent, MD as PCP - General (Family Medicine) Thelma Comp, Ona (Optometry) Telford Nab, RN as Registered Nurse   Name of the patient: Craig Koch  [id number]  Mar 27, 1955  Date of visit: 03/05/19   INTERVAL HISTORY-  Wife is at bedside today.  Patient had kidney ultrasound done which did not show any hydronephrosis. Creatinine continues to rise and nephrology has been consulted. Patient is lethargic, opens his eyes with verbal stimuli.   Review of systems- Review of Systems  Unable to perform ROS: Mental status change    Allergies  Allergen Reactions  . Lipitor [Atorvastatin Calcium] Other (See Comments)    Aches.  Tolerated crestor.     Patient Active Problem List   Diagnosis Date Noted  . Acute alteration in mental status   . Palliative care encounter   . Atrial fibrillation with RVR ([place]) 03/03/2019  . DKA (diabetic ketoacidoses) ([place]) 03/03/2019  . Diabetes mellitus without complication ([place]) 21/97/5883  . Hypothermia 03/03/2019  . SOB (shortness of breath) 03/03/2019  . AKI (acute kidney injury) ([place]) 03/03/2019  . NSTEMI (non-ST elevated myocardial infarction) ([place]) 03/03/2019  . Hyponatremia 02/08/2019  . Uncontrolled type 2 diabetes mellitus with hyperglycemia ([place]) 02/08/2019  . Leg swelling 02/08/2019  . Paronychia, finger 12/19/2018  . Encounter for antineoplastic chemotherapy 12/08/2018  . Tobacco abuse 10/12/2018  . Malignant pleural effusion   . Administrative encounter 09/23/2018  . lung adenocarcinoma 09/08/2018  . Goals of care, counseling/discussion 09/08/2018  . Recurrent pleural effusion on left 09/02/2018  . Elevated troponin 08/24/2018  . Medicare annual wellness visit, subsequent 08/07/2018  . Lower urinary tract symptoms (LUTS) 08/07/2018  . Neck pain 08/07/2018  . CAD (coronary artery  disease), native coronary artery 09/23/2017  . Smoker 09/23/2017  . Chronic joint pain 10/18/2016  . Healthcare maintenance 07/17/2016  . Advance care planning 07/17/2016  . Fungal rash of torso 05/06/2016  . Trigger finger 05/06/2016  . History of alcohol use 11/17/2015  . Skin lesion 05/19/2015  . Anxiety state 02/19/2015  . Hand weakness 05/23/2011  . Diabetes mellitus with complication ([place]) 25/49/8264  . HLD (hyperlipidemia) 03/05/2010  . Essential hypertension 03/05/2010  . Coronary atherosclerosis 03/05/2010     Past Medical History:  Diagnosis Date  . Anxiety   . Arthritis   . Coronary artery disease   . Diabetes mellitus   . Dyslipidemia   . Hx of CABG   . Hyperlipidemia   . Hypertension   . Malignant neoplasm of unspecified part of unspecified bronchus or lung ([place]) 08/2018   Immunotherapy  . Pleural effusion      Past Surgical History:  Procedure Laterality Date  . CATARACT EXTRACTION Left 09/2015  . CHEST TUBE INSERTION Left 10/01/2018   Procedure: INSERTION PLEURAL DRAINAGE CATHETER;  Surgeon: [name], MD;  Location: [facility] ORS;  Service: Thoracic;  Laterality: Left;  . CORONARY ARTERY BYPASS GRAFT  2004   (CABG with LIMA to the  LAD, SVG to OM2/OM3, SVG  to diag  . Left ankle surgery     repair of fracture  . Right lower leg surgery     rod    Social History   Socioeconomic History  . Marital status: Married    Spouse name: Not on file  . Number of children: Not on file  . Years of education: Not on file  . Highest education level: Not on file  Occupational History  . Not on file  Tobacco Use  . Smoking status: Current Every Day Smoker    Packs/day: 0.20    Years: 45.00    Pack years: 9.00    Types: Cigarettes  . Smokeless tobacco: Former Systems developer    Types: Snuff  Substance and Sexual Activity  . Alcohol use: Yes    Alcohol/week: 6.0 standard drinks    Types: 6 Cans of beer per week    Comment: occ, average 6 pack in a week  . Drug  use: No  . Sexual activity: Never  Other Topics Concern  . Not on file  Social History Narrative   On disability 2009 after prev injuries and CAD.     Married 1976   2 kids, 4 grandkids.    Social Determinants of Health   Financial Resource Strain:   . Difficulty of Paying Living Expenses: Not on file  Food Insecurity:   . Worried About Charity fundraiser in the Last Year: Not on file  . Ran Out of Food in the Last Year: Not on file  Transportation Needs:   . Lack of Transportation (Medical): Not on file  . Lack of Transportation (Non-Medical): Not on file  Physical Activity:   . Days of Exercise per Week: Not on file  . Minutes of Exercise per Session: Not on file  Stress:   . Feeling of Stress : Not on file  Social Connections:   . Frequency of Communication with Friends and Family: Not on file  . Frequency of Social Gatherings with Friends and Family: Not on file  . Attends Religious Services: Not on file  . Active Member of Clubs or Organizations: Not on file  . Attends Archivist Meetings: Not on file  . Marital Status: Not on file  Intimate Partner Violence:   . Fear of Current or Ex-Partner: Not on file  . Emotionally Abused: Not on file  . Physically Abused: Not on file  . Sexually Abused: Not on file     Family History  Problem Relation Age of Onset  . Dementia Mother   . Heart disease Father   . Colon cancer Neg Hx   . Prostate cancer Neg Hx   . Diabetes Neg Hx      Current Facility-Administered Medications:  .  acetaminophen (TYLENOL) tablet 650 mg, 650 mg, Oral, Q6H PRN, Ivor Costa, MD .  albuterol (PROVENTIL) (2.5 MG/3ML) 0.083% nebulizer solution 3 mL, 3 mL, Inhalation, Q4H PRN, Ivor Costa, MD .  amiodarone (NEXTERONE PREMIX) 360-4.14 MG/200ML-% (1.8 mg/mL) IV infusion, 30 mg/hr, Intravenous, Continuous, Darel Hong D, NP, Last Rate: 16.67 mL/hr at 03/05/19 1147, 30 mg/hr at 03/05/19 1147 .  aspirin EC tablet 325 mg, 325 mg, Oral,  Daily, Ivor Costa, MD, 325 mg at 03/04/19 1027 .  chlorhexidine (PERIDEX) 0.12 % solution 15 mL, 15 mL, Mouth Rinse, BID, Darel Hong D, NP, 15 mL at 03/04/19 2156 .  Chlorhexidine Gluconate Cloth 2 % PADS 6 each, 6 each, Topical, Daily, Darel Hong D, NP .  dabrafenib mesylate (TAFINLAR) capsule 150 mg, 150 mg, Oral, BID, Ivor Costa, MD .  dextromethorphan-guaiFENesin [facility] DM) 30-600 MG per 12 hr tablet 1 tablet, 1 tablet, Oral, BID, Ivor Costa, MD, 1 tablet at 03/04/19 1030 .  dextrose 5 % in lactated ringers infusion, , Intravenous, Continuous, Ivor Costa, MD, Last Rate: 75 mL/hr at 03/04/19 2104, New Bag at 03/04/19 2104 .  dextrose 50 % solution 0-50 mL,  0-50 mL, Intravenous, PRN, Ivor Costa, MD .  diltiazem (CARDIZEM) 125 mg in dextrose 5% 125 mL (1 mg/mL) infusion, 5-15 mg/hr, Intravenous, Titrated, Ivor Costa, MD, Stopped at 03/03/19 2026 .  diltiazem (CARDIZEM) 125 mg in dextrose 5% 125 mL (1 mg/mL) infusion, 5-15 mg/hr, Intravenous, Titrated, Ivor Costa, MD, Last Rate: 10 mL/hr at 03/03/19 1815, 10 mg/hr at 03/03/19 1815 .  dronabinol (MARINOL) capsule 5 mg, 5 mg, Oral, BID AC, Ivor Costa, MD, 5 mg at 03/04/19 0840 .  DULoxetine (CYMBALTA) DR capsule 30 mg, 30 mg, Oral, Daily, Ivor Costa, MD, 30 mg at 03/04/19 0959 .  furosemide (LASIX) injection 40 mg, 40 mg, Intravenous, Daily, Sreenath, Sudheer B, MD, 40 mg at 03/05/19 1015 .  heparin ADULT infusion 100 units/mL (25000 units/254m sodium chloride 0.45%), 1,700 Units/hr, Intravenous, Continuous, GDallie Piles RPH, Last Rate: 17 mL/hr at 03/05/19 1115, 1,700 Units/hr at 03/05/19 1115 .  insulin aspart (novoLOG) injection 0-15 Units, 0-15 Units, Subcutaneous, Q4H, Sreenath, Sudheer B, MD, 3 Units at 03/05/19 1127 .  insulin glargine (LANTUS) injection 35 Units, 35 Units, Subcutaneous, Q24H, Sreenath, Sudheer B, MD, 35 Units at 03/05/19 1235 .  ipratropium-albuterol (DUONEB) 0.5-2.5 (3) MG/3ML nebulizer solution 3 mL, 3 mL,  Nebulization, Q6H, Sreenath, Sudheer B, MD .  LORazepam (ATIVAN) injection 0-4 mg, 0-4 mg, Intravenous, Q6H, 2 mg at 03/05/19 0522 **FOLLOWED BY** LORazepam (ATIVAN) injection 0-4 mg, 0-4 mg, Intravenous, Q12H, NIvor Costa MD .  LORazepam (ATIVAN) tablet 1-4 mg, 1-4 mg, Oral, Q1H PRN, 2 mg at 03/04/19 0834 **OR** LORazepam (ATIVAN) injection 1-4 mg, 1-4 mg, Intravenous, Q1H PRN, NIvor Costa MD, 2 mg at 03/04/19 2055 .  MEDLINE mouth rinse, 15 mL, Mouth Rinse, q12n4p, KDarel HongD, NP, 15 mL at 03/05/19 1131 .  metoprolol tartrate (LOPRESSOR) tablet 25 mg, 25 mg, Oral, BID, Sreenath, Sudheer B, MD .  multivitamin with minerals tablet 1 tablet, 1 tablet, Oral, Daily, NIvor Costa MD, 1 tablet at 03/04/19 1001 .  nicotine (NICODERM CQ - dosed in mg/24 hours) patch 21 mg, 21 mg, Transdermal, Daily, NIvor Costa MD, 21 mg at 03/05/19 1030 .  ondansetron (ZOFRAN) injection 4 mg, 4 mg, Intravenous, Q8H PRN, NIvor Costa MD .  piperacillin-tazobactam (ZOSYN) IVPB 3.375 g, 3.375 g, Intravenous, Q8H, GDallie Piles RPH .  pneumococcal 23 valent vaccine (PNEUMOVAX-23) injection 0.5 mL, 0.5 mL, Intramuscular, Prior to discharge, Sreenath, Sudheer B, MD .  rosuvastatin (CRESTOR) tablet 10 mg, 10 mg, Oral, Daily, NIvor Costa MD, 10 mg at 03/04/19 1035 .  thiamine (B-1) 1833mg, folic acid 1 mg in sodium chloride 0.9 % 50 mL injection, , Intravenous, Q24H, GDallie Piles RPH, Last Rate: 50 mL/hr at 03/05/19 1113, New Bag at 03/05/19 1113 .  trametinib dimethyl sulfoxide (MEKINIST) tablet 2 mg, 2 mg, Oral, Daily, NIvor Costa MD .  vancomycin (VANCOREADY) IVPB 1500 mg/300 mL, 1,500 mg, Intravenous, Once, [facility]ie [facility] Last Rate: 150 mL/hr at 03/05/19 1237, 1,500 mg at 03/05/19 1237   Physical exam:  Vitals:   03/05/19 0900 03/05/19 1000 03/05/19 1100 03/05/19 1200  BP: 114/70 (!) 128/54 108/64 121/60  Pulse: (!) 110 63 (!) 58   Resp: (!) 27 (!) 29 (!) 38 (!) 35  Temp:    97.7 F (36.5 C)    TempSrc:      SpO2: 97% 96% 97% 99%  Weight:      Height:       Physical Exam  Constitutional: No distress.  Eyes: No scleral icterus.  Cardiovascular:  Tachycardia  Pulmonary/Chest:  Breathing via nasal cannula oxygen.  Abdominal: Soft.  Musculoskeletal:     Cervical back: Neck supple.  Neurological:  Lethargic.  Skin: Skin is warm.       CMP Latest Ref Rng & Units 03/05/2019  Glucose 70 - 99 mg/dL 311(H)  BUN 8 - 23 mg/dL 65(H)  Creatinine 0.61 - 1.24 mg/dL 3.16(H)  Sodium 135 - 145 mmol/L 128(L)  Potassium 3.5 - 5.1 mmol/L 3.5  Chloride 98 - 111 mmol/L 102  CO2 22 - 32 mmol/L 15(L)  Calcium 8.9 - 10.3 mg/dL 8.0(L)  Total Protein 6.5 - 8.1 g/dL -  Total Bilirubin 0.3 - 1.2 mg/dL -  Alkaline Phos 38 - 126 U/L -  AST 15 - 41 U/L -  ALT 0 - 44 U/L -   CBC Latest Ref Rng & Units 03/05/2019  WBC 4.0 - 10.5 K/uL 8.5  Hemoglobin 13.0 - 17.0 g/dL 11.1(L)  Hematocrit 39.0 - 52.0 % 31.1(L)  Platelets 150 - 400 K/uL 189    RADIOGRAPHIC STUDIES: I have personally reviewed the radiological images as listed and agreed with the findings in the report. US RENAL  Result Date: 03/04/2019 CLINICAL DATA:  Acute kidney injury EXAM: RENAL / URINARY TRACT ULTRASOUND COMPLETE COMPARISON:  CT 01/22/2019 FINDINGS: Right Kidney: Renal measurements: 11.4 x 6 x 6 cm = volume: 215 mL . Echogenicity within normal limits. No mass or hydronephrosis visualized. Left Kidney: Renal measurements: 11.8 x 7.3 x 6.5 cm = volume: 291.8 mL. Echogenicity within normal limits. No mass or hydronephrosis visualized. Bladder: Appears normal for degree of bladder distention. Other: Incidental note made of gallstones and small effusions. Heterogenous prostate. IMPRESSION: Normal ultrasound appearance of the kidneys Electronically Signed   By: Donavan Foil M.D.   On: 03/04/2019 15:37   DG Chest Portable 1 View  Result Date: 03/03/2019 CLINICAL DATA:  Shortness of breath, since Monday worsening this morning around  1 a.m. EXAM: PORTABLE CHEST 1 VIEW COMPARISON:  12/19/2018 FINDINGS: Cardiomediastinal contours are stable following median sternotomy with persistent left-sided pleural effusion and pleural-parenchymal scarring. No new areas of consolidation or evidence of right-sided pleural effusion. No signs of acute bone process. IMPRESSION: Persistent left-sided pleural effusion and pleural-parenchymal scarring. Electronically Signed   By: Zetta Bills M.D.   On: 03/03/2019 13:59   ECHOCARDIOGRAM COMPLETE  Result Date: 03/04/2019    ECHOCARDIOGRAM REPORT   Patient Name:   CLARKE PERETZ Date of Exam: 03/04/2019 Medical Rec #:  [id number]       Height:       72.0 in Accession #:    [id number]      Weight:       185.0 lb Date of Birth:  11-02-1955       BSA:          2.061 m Patient Age:    64 years        BP:           125/72 mmHg Patient Gender: M               HR:           119 bpm. Exam Location:  [facility] Procedure: 2D Echo, Color Doppler and Cardiac Doppler Indications:     I21.4 NSTEMI  History:         Patient has prior history of Echocardiogram examinations, most  recent 08/24/2018. CAD, Prior CABG; Risk Factors:Hypertension,                  Dyslipidemia and Diabetes. Lung Cancer.  Sonographer:     Charmayne Sheer RDCS (AE) Referring Phys:  [place] Phys: Kathlyn Sacramento MD IMPRESSIONS  1. Left ventricular ejection fraction, by estimation, is 30 to 35%. The left ventricle has moderately decreased function. The left ventricle demonstrates global hypokinesis. Left ventricular diastolic parameters are indeterminate.  2. Right ventricular systolic function is normal. The right ventricular size is normal. Tricuspid regurgitation signal is inadequate for assessing PA pressure.  3. Left atrial size was mild to moderately dilated.  4. The mitral valve is normal in structure and function. Moderate mitral valve regurgitation. No evidence of mitral stenosis.  5. The aortic valve is normal in  structure and function. Aortic valve regurgitation is trivial. Mild to moderate aortic valve sclerosis/calcification is present, without any evidence of aortic stenosis.  6. The inferior vena cava is normal in size with greater than 50% respiratory variability, suggesting right atrial pressure of 3 mmHg. FINDINGS  Left Ventricle: Left ventricular ejection fraction, by estimation, is 30 to 35%. The left ventricle has moderately decreased function. The left ventricle demonstrates global hypokinesis. The left ventricular internal cavity size was normal in size. There is no left ventricular hypertrophy. Left ventricular diastolic parameters are indeterminate. Right Ventricle: The right ventricular size is normal. No increase in right ventricular wall thickness. Right ventricular systolic function is normal. Tricuspid regurgitation signal is inadequate for assessing PA pressure. Left Atrium: Left atrial size was mild to moderately dilated. Right Atrium: Right atrial size was normal in size. Pericardium: There is no evidence of pericardial effusion. Mitral Valve: The mitral valve is normal in structure and function. Normal mobility of the mitral valve leaflets. Moderate mitral valve regurgitation. No evidence of mitral valve stenosis. MV peak gradient, 7.5 mmHg. The mean mitral valve gradient is 4.0  mmHg. Tricuspid Valve: The tricuspid valve is normal in structure. Tricuspid valve regurgitation is not demonstrated. No evidence of tricuspid stenosis. Aortic Valve: The aortic valve is normal in structure and function. Aortic valve regurgitation is trivial. Aortic regurgitation PHT measures 281 msec. Mild to moderate aortic valve sclerosis/calcification is present, without any evidence of aortic stenosis. Aortic valve mean gradient measures 5.0 mmHg. Aortic valve peak gradient measures 8.5 mmHg. Aortic valve area, by VTI measures 2.53 cm. Pulmonic Valve: The pulmonic valve was normal in structure. Pulmonic valve  regurgitation is not visualized. No evidence of pulmonic stenosis. Aorta: The aortic root is normal in size and structure. Venous: The inferior vena cava is normal in size with greater than 50% respiratory variability, suggesting right atrial pressure of 3 mmHg. IAS/Shunts: No atrial level shunt detected by color flow Doppler.  LEFT VENTRICLE PLAX 2D LVIDd:         5.21 cm  Diastology LVIDs:         4.21 cm  LV e' lateral:   7.83 cm/s LV PW:         0.84 cm  LV E/e' lateral: 13.4 LV IVS:        0.89 cm  LV e' medial:    7.94 cm/s LVOT diam:     2.40 cm  LV E/e' medial:  13.2 LV SV:         54 LV SV Index:   26 LVOT Area:     4.52 cm  RIGHT VENTRICLE RV Basal diam:  3.08  cm LEFT ATRIUM             Index       RIGHT ATRIUM           Index LA diam:        4.70 cm 2.28 cm/m  RA Area:     14.10 cm LA Vol (A2C):   62.0 ml 30.08 ml/m RA Volume:   35.20 ml  17.08 ml/m LA Vol (A4C):   68.6 ml 33.29 ml/m LA Biplane Vol: 65.3 ml 31.68 ml/m  AORTIC VALVE                    PULMONIC VALVE AV Area (Vmax):    2.95 cm     PV Vmax:       1.10 m/s AV Area (Vmean):   2.86 cm     PV Vmean:      68.400 cm/s AV Area (VTI):     2.53 cm     PV VTI:        0.146 m AV Vmax:           146.00 cm/s  PV Peak grad:  4.8 mmHg AV Vmean:          103.000 cm/s PV Mean grad:  2.0 mmHg AV VTI:            0.213 m AV Peak Grad:      8.5 mmHg AV Mean Grad:      5.0 mmHg LVOT Vmax:         95.30 cm/s LVOT Vmean:        65.200 cm/s LVOT VTI:          0.119 m LVOT/AV VTI ratio: 0.56 AI PHT:            281 msec  AORTA Ao Root diam: 3.00 cm MITRAL VALVE MV Area (PHT): 3.92 cm     SHUNTS MV Peak grad:  7.5 mmHg     Systemic VTI:  0.12 m MV Mean grad:  4.0 mmHg     Systemic Diam: 2.40 cm MV Vmax:       1.37 m/s MV Vmean:      92.7 cm/s MV Decel Time: 194 msec MV E velocity: 104.80 cm/s Kathlyn Sacramento MD Electronically signed by Kathlyn Sacramento MD Signature Date/Time: 03/04/2019/2:46:43 PM    Final     Assessment and plan-  Patient is a 64 y.o. male  with history of stage IV lung cancer, BRAF mutation on targeted therapy, hypertension, hyperlipidemia, CAD with history of CABG, diabetes, anxiety presented to emergency room due to breathing difficulties.he was found have DKA, AKI, mental status change.  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  He has had excellent response to targeted therapy which was documented on CT scan on 01/22/2019.  #Altered mental status, no significant improvement.  May be slightly more alert today intermittently, still confused. Suspect metabolic encephalopathy.  Small likelihood of cancer progression in the brain.  Obtain CT brain without contrast.    #Acute renal failure, creatinine continue to rise, kidney ultrasound did not show hydronephrosis/obstruction.  Likely ATN secondary to hypotension/dehydration. Nephrology Dr. Keturah Barre recommendation was reviewed.  Continue IV hydration and supportive care.  Per nephrology, no acute indication for dialysis but may need during this admission. Metabolic acidosis, bicarb level is 15.  Therefore recommend to change to sodium bicarb. #DKA, managed by ICU team. #Elevated procalcitonin level.  Patient has been started on empiric antibiotics. #Acute NSTEMI /atrial fibrillation with RVR, cardiology on board.  Acute CHF, ejection fraction decreased to 30%.  He is currently on heparin infusion.  Patient has history of CAD/CABG.  Dabrafenib may have further contributed to his CHF. He has been off dabrafenib and trametinib anyhow due to the acute illness.  Goal of care discussion, I had a lengthy discussion with patient's wife at the bedside.  Despite that he is stage IV lung cancer with BRAF mutation is very treatable and the patient actually had an excellent response to the treatments, his overall prognosis is poor given his multiple other comorbidities and acute illness.  Palliative service is on  board.  Discussed care with hospitalist Dr. Priscella Mann and ICU Dr. Patsey Berthold. Thank you for allowing me to participate in the care of this patient.   Earlie Server, MD, PhD Hematology Oncology [facility] Pager- [phone] 03/05/2019

## 2019-03-05 NOTE — Progress Notes (Signed)
Better day. More coherent and cooperative. Still confused at times. Wife in and out. Head CT today as well. Foley placed for urinary retention.

## 2019-03-06 ENCOUNTER — Other Ambulatory Visit: Payer: Self-pay

## 2019-03-06 ENCOUNTER — Encounter: Payer: Self-pay | Admitting: Internal Medicine

## 2019-03-06 DIAGNOSIS — E43 Unspecified severe protein-calorie malnutrition: Secondary | ICD-10-CM | POA: Insufficient documentation

## 2019-03-06 DIAGNOSIS — I2510 Atherosclerotic heart disease of native coronary artery without angina pectoris: Secondary | ICD-10-CM

## 2019-03-06 LAB — GLUCOSE, CAPILLARY
Glucose-Capillary: 154 mg/dL — ABNORMAL HIGH (ref 70–99)
Glucose-Capillary: 183 mg/dL — ABNORMAL HIGH (ref 70–99)
Glucose-Capillary: 200 mg/dL — ABNORMAL HIGH (ref 70–99)
Glucose-Capillary: 82 mg/dL (ref 70–99)
Glucose-Capillary: 82 mg/dL (ref 70–99)

## 2019-03-06 LAB — BASIC METABOLIC PANEL
Anion gap: 13 (ref 5–15)
BUN: 75 mg/dL — ABNORMAL HIGH (ref 8–23)
CO2: 14 mmol/L — ABNORMAL LOW (ref 22–32)
Calcium: 8.2 mg/dL — ABNORMAL LOW (ref 8.9–10.3)
Chloride: 102 mmol/L (ref 98–111)
Creatinine, Ser: 4.22 mg/dL — ABNORMAL HIGH (ref 0.61–1.24)
GFR calc Af Amer: 16 mL/min — ABNORMAL LOW (ref >60)
GFR calc non Af Amer: 14 mL/min — ABNORMAL LOW (ref >60)
Glucose, Bld: 182 mg/dL — ABNORMAL HIGH (ref 70–99)
Potassium: 3.3 mmol/L — ABNORMAL LOW (ref 3.5–5.1)
Sodium: 129 mmol/L — ABNORMAL LOW (ref 135–145)

## 2019-03-06 LAB — CBC
HCT: 31 % — ABNORMAL LOW (ref 39.0–52.0)
Hemoglobin: 11 g/dL — ABNORMAL LOW (ref 13.0–17.0)
MCH: 27.4 pg (ref 26.0–34.0)
MCHC: 35.5 g/dL (ref 30.0–36.0)
MCV: 77.1 fL — ABNORMAL LOW (ref 80.0–100.0)
Platelets: 204 10*3/uL (ref 150–400)
RBC: 4.02 MIL/uL — ABNORMAL LOW (ref 4.22–5.81)
RDW: 17.4 % — ABNORMAL HIGH (ref 11.5–15.5)
WBC: 7.8 10*3/uL (ref 4.0–10.5)
nRBC: 0 % (ref 0.0–0.2)

## 2019-03-06 LAB — VANCOMYCIN, RANDOM: Vancomycin Rm: 16

## 2019-03-06 LAB — HEPARIN LEVEL (UNFRACTIONATED)
Heparin Unfractionated: 0.23 IU/mL — ABNORMAL LOW (ref 0.30–0.70)
Heparin Unfractionated: 0.37 IU/mL (ref 0.30–0.70)
Heparin Unfractionated: 0.32 IU/mL (ref 0.30–0.70)

## 2019-03-06 LAB — PROCALCITONIN: Procalcitonin: 7.04 ng/mL

## 2019-03-06 LAB — MAGNESIUM: Magnesium: 1.9 mg/dL (ref 1.7–2.4)

## 2019-03-06 MED ORDER — ALBUMIN HUMAN 25 % IV SOLN
12.5000 g | Freq: Once | INTRAVENOUS | Status: AC
  Administered 2019-03-06: 12.5 g via INTRAVENOUS
  Filled 2019-03-06: qty 50

## 2019-03-06 MED ORDER — ENSURE MAX PROTEIN PO LIQD
11.0000 [oz_av] | Freq: Two times a day (BID) | ORAL | Status: DC
  Administered 2019-03-07 – 2019-03-18 (×14): 11 [oz_av] via ORAL
  Filled 2019-03-06: qty 330

## 2019-03-06 MED ORDER — IPRATROPIUM-ALBUTEROL 0.5-2.5 (3) MG/3ML IN SOLN
3.0000 mL | Freq: Three times a day (TID) | RESPIRATORY_TRACT | Status: DC
  Administered 2019-03-06 – 2019-03-19 (×34): 3 mL via RESPIRATORY_TRACT
  Filled 2019-03-06 (×35): qty 3

## 2019-03-06 MED ORDER — ASPIRIN EC 81 MG PO TBEC
81.0000 mg | DELAYED_RELEASE_TABLET | Freq: Every day | ORAL | Status: DC
  Administered 2019-03-07 – 2019-03-19 (×13): 81 mg via ORAL
  Filled 2019-03-06 (×12): qty 1

## 2019-03-06 MED ORDER — PIPERACILLIN-TAZOBACTAM 3.375 G IVPB
3.3750 g | Freq: Two times a day (BID) | INTRAVENOUS | Status: DC
  Administered 2019-03-06 – 2019-03-08 (×4): 3.375 g via INTRAVENOUS
  Filled 2019-03-06 (×6): qty 50

## 2019-03-06 MED ORDER — INSULIN GLARGINE 100 UNIT/ML ~~LOC~~ SOLN
40.0000 [IU] | Freq: Every day | SUBCUTANEOUS | Status: DC
  Administered 2019-03-06: 5 [IU] via SUBCUTANEOUS
  Administered 2019-03-07: 40 [IU] via SUBCUTANEOUS
  Filled 2019-03-06 (×3): qty 0.4

## 2019-03-06 NOTE — Progress Notes (Signed)
[facility], [place] 03/06/19  Subjective:   Hospital day # 3 S Creatinine trend is worsening but UOP 925 cc    Renal: 03/02 0701 - 03/03 0700 In: 4078.8 [I.V.:3626.5; IV Piggyback:452.3] Out: 925 [Urine:925] Lab Results  Component Value Date   CREATININE 4.22 (H) 03/06/2019   CREATININE 3.16 (H) 03/05/2019   CREATININE 2.13 (H) 03/04/2019     Objective:  Vital signs in last 24 hours:  Temp:  [97.7 F (36.5 C)-100.2 F (37.9 C)] 98.2 F (36.8 C) (03/03 0800) Pulse Rate:  [36-125] 91 (03/03 0900) Resp:  [23-45] 23 (03/03 0900) BP: (108-135)/(54-101) 115/61 (03/03 0900) SpO2:  [88 %-99 %] 96 % (03/03 0900)  Weight change:  Filed Weights   03/03/19 1328 03/03/19 2000  Weight: 86.2 kg 83.9 kg    Intake/Output:    Intake/Output Summary (Last 24 hours) at 03/06/2019 0908 Last data filed at 03/06/2019 8099 Gross per 24 hour  Intake 4197.45 ml  Output 975 ml  Net 3222.45 ml     Physical Exam: General:  Ill-appearing, laying in the bed  HEENT  dry oral mucous membranes  Pulm/lungs  normal effort, clear anteriorly and laterally  CVS/Heart  irregular rhythm  Abdomen:   Soft, nontender  Extremities:  Trace to 1+ pitting edema  Neurologic:  Lethargic, slurred speech, able to answer few simple questions  Skin:  Warm, dry   Foley in place       Basic Metabolic Panel:  Recent Labs  Lab 03/03/19 1750 03/03/19 1936 03/03/19 2359 03/03/19 2359 03/04/19 0526 03/04/19 0526 03/04/19 1059 03/05/19 0153 03/06/19 0152  NA 123*   < > 127*  --  127*  --  127* 128* 129*  K 5.4*   < > 3.5  --  3.5  --  3.7 3.5 3.3*  CL 89*   < > 96*  --  99  --  102 102 102  CO2 8*   < > 13*  --  14*  --  14* 15* 14*  GLUCOSE 756*   < > 440*  --  177*  --  134* 311* 182*  BUN 43*   < > 48*  --  53*  --  53* 65* 75*  CREATININE 1.79*   < > 1.98*  --  1.88*  --  2.13* 3.16* 4.22*  CALCIUM 8.2*   < > 8.0*   < > 8.1*   < > 8.0* 8.0* 8.2*  MG 2.3  --   --   --    --   --   --  1.9 1.9  PHOS  --   --   --   --   --   --   --  1.4*  --    < > = values in this interval not displayed.     CBC: Recent Labs  Lab 03/03/19 1330 03/03/19 2359 03/04/19 1059 03/05/19 0153 03/06/19 0152  WBC 9.4 9.9 8.5 8.5 7.8  NEUTROABS 7.3  --   --   --   --   HGB 13.4 12.2* 12.2* 11.1* 11.0*  HCT 42.9 36.2* 34.2* 31.1* 31.0*  MCV 87.4 82.1 77.0* 77.8* 77.1*  PLT 232 192 210 189 204     No results found for: HEPBSAG, HEPBSAB, HEPBIGM    Microbiology:  Recent Results (from the past 240 hour(s))  Respiratory Panel by RT PCR (Flu A&B, Covid) - Nasopharyngeal Swab     Status: None   Collection Time: 03/03/19  3:31 PM   Specimen: Nasopharyngeal Swab  Result Value Ref Range Status   SARS Coronavirus 2 by RT PCR NEGATIVE NEGATIVE Final    Comment: (NOTE) SARS-CoV-2 target nucleic acids are NOT DETECTED. The SARS-CoV-2 RNA is generally detectable in upper respiratoy specimens during the acute phase of infection. The lowest concentration of SARS-CoV-2 viral copies this assay can detect is 131 copies/mL. A negative result does not preclude SARS-Cov-2 infection and should not be used as the sole basis for treatment or other patient management decisions. A negative result may occur with  improper specimen collection/handling, submission of specimen other than nasopharyngeal swab, presence of viral mutation(s) within the areas targeted by this assay, and inadequate number of viral copies (<131 copies/mL). A negative result must be combined with clinical observations, patient history, and epidemiological information. The expected result is Negative. Fact Sheet for Patients:  PinkCheek.be Fact Sheet for Healthcare Providers:  GravelBags.it This test is not yet ap proved or cleared by the [place] FDA and  has been authorized for detection and/or diagnosis of SARS-CoV-2 by FDA under an Emergency Use  Authorization (EUA). This EUA will remain  in effect (meaning this test can be used) for the duration of the COVID-19 declaration under Section 564(b)(1) of the Act, 21 U.S.C. section 360bbb-3(b)(1), unless the authorization is terminated or revoked sooner.    Influenza A by PCR NEGATIVE NEGATIVE Final   Influenza B by PCR NEGATIVE NEGATIVE Final    Comment: (NOTE) The Xpert Xpress SARS-CoV-2/FLU/RSV assay is intended as an aid in  the diagnosis of influenza from Nasopharyngeal swab specimens and  should not be used as a sole basis for treatment. Nasal washings and  aspirates are unacceptable for Xpert Xpress SARS-CoV-2/FLU/RSV  testing. Fact Sheet for Patients: PinkCheek.be Fact Sheet for Healthcare Providers: GravelBags.it This test is not yet approved or cleared by the [place] FDA and  has been authorized for detection and/or diagnosis of SARS-CoV-2 by  FDA under an Emergency Use Authorization (EUA). This EUA will remain  in effect (meaning this test can be used) for the duration of the  Covid-19 declaration under Section 564(b)(1) of the Act, 21  U.S.C. section 360bbb-3(b)(1), unless the authorization is  terminated or revoked. Performed at [facility], [address] [place], [place] [address]   MRSA PCR Screening     Status: None   Collection Time: 03/03/19  7:48 PM   Specimen: Nasopharyngeal  Result Value Ref Range Status   MRSA by PCR NEGATIVE NEGATIVE Final    Comment:        The GeneXpert MRSA Assay (FDA approved for NASAL specimens only), is one component of a comprehensive MRSA colonization surveillance program. It is not intended to diagnose MRSA infection nor to guide or monitor treatment for MRSA infections. Performed at [facility], [address] [place], [place] [address]   CULTURE, BLOOD (ROUTINE X 2) w Reflex to ID Panel     Status: None (Preliminary result)    Collection Time: 03/05/19 10:16 AM   Specimen: BLOOD  Result Value Ref Range Status   Specimen Description BLOOD LEFT ANTECUBITAL  Final   Special Requests   Final    BOTTLES DRAWN AEROBIC AND ANAEROBIC Blood Culture adequate volume   Culture   Final    NO GROWTH < 24 HOURS Performed at [facility], [address] [place], [place] [address]    Report Status PENDING  Incomplete  CULTURE, BLOOD (ROUTINE X 2) w Reflex  to ID Panel     Status: None (Preliminary result)   Collection Time: 03/05/19 10:22 AM   Specimen: BLOOD  Result Value Ref Range Status   Specimen Description BLOOD BLOOD LEFT HAND  Final   Special Requests   Final    BOTTLES DRAWN AEROBIC ONLY Blood Culture adequate volume   Culture   Final    NO GROWTH < 24 HOURS Performed at [facility], [address], [place], [place] [address]    Report Status PENDING  Incomplete    Coagulation Studies: Recent Labs    03/03/19 1750  LABPROT 14.6  INR 1.2    Urinalysis: No results for input(s): COLORURINE, LABSPEC, PHURINE, GLUCOSEU, HGBUR, BILIRUBINUR, KETONESUR, PROTEINUR, UROBILINOGEN, NITRITE, LEUKOCYTESUR in the last 72 hours.  Invalid input(s): APPERANCEUR    Imaging: CT HEAD WO CONTRAST  Result Date: 03/05/2019 CLINICAL DATA:  Metastatic disease evaluation.  Lung cancer. EXAM: CT HEAD WITHOUT CONTRAST TECHNIQUE: Contiguous axial images were obtained from the base of the skull through the vertex without intravenous contrast. COMPARISON:  MRI head 09/20/2018 FINDINGS: Brain: Mild atrophy. Mild hypodensity in the white matter unchanged from the prior MRI. No acute infarct, hemorrhage, mass. No edema or midline shift. Vascular: Negative for hyperdense vessel Skull: Negative skull. Sinuses/Orbits: Mucosal edema and bony thickening right maxillary sinus. Chronic nasal bone fracture. Left cataract extraction. Other: None IMPRESSION: No acute abnormality and negative for metastatic disease on unenhanced  CT. Atrophy and mild chronic microvascular ischemia. Electronically Signed   By: Franchot Gallo M.D.   On: 03/05/2019 15:28   US RENAL  Result Date: 03/04/2019 CLINICAL DATA:  Acute kidney injury EXAM: RENAL / URINARY TRACT ULTRASOUND COMPLETE COMPARISON:  CT 01/22/2019 FINDINGS: Right Kidney: Renal measurements: 11.4 x 6 x 6 cm = volume: 215 mL . Echogenicity within normal limits. No mass or hydronephrosis visualized. Left Kidney: Renal measurements: 11.8 x 7.3 x 6.5 cm = volume: 291.8 mL. Echogenicity within normal limits. No mass or hydronephrosis visualized. Bladder: Appears normal for degree of bladder distention. Other: Incidental note made of gallstones and small effusions. Heterogenous prostate. IMPRESSION: Normal ultrasound appearance of the kidneys Electronically Signed   By: Donavan Foil M.D.   On: 03/04/2019 15:37   ECHOCARDIOGRAM COMPLETE  Result Date: 03/04/2019    ECHOCARDIOGRAM REPORT   Patient Name:   Craig Koch Date of Exam: 03/04/2019 Medical Rec #:  [id number]       Height:       72.0 in Accession #:    [id number]      Weight:       185.0 lb Date of Birth:  12/07/1955       BSA:          2.061 m Patient Age:    39 years        BP:           125/72 mmHg Patient Gender: M               HR:           119 bpm. Exam Location:  [facility] Procedure: 2D Echo, Color Doppler and Cardiac Doppler Indications:     I21.4 NSTEMI  History:         Patient has prior history of Echocardiogram examinations, most                  recent 08/24/2018. CAD, Prior CABG; Risk Factors:Hypertension,  Dyslipidemia and Diabetes. Lung Cancer.  Sonographer:     Charmayne Sheer RDCS (AE) Referring Phys:  [place] Phys: Kathlyn Sacramento MD IMPRESSIONS  1. Left ventricular ejection fraction, by estimation, is 30 to 35%. The left ventricle has moderately decreased function. The left ventricle demonstrates global hypokinesis. Left ventricular diastolic parameters are indeterminate.  2. Right  ventricular systolic function is normal. The right ventricular size is normal. Tricuspid regurgitation signal is inadequate for assessing PA pressure.  3. Left atrial size was mild to moderately dilated.  4. The mitral valve is normal in structure and function. Moderate mitral valve regurgitation. No evidence of mitral stenosis.  5. The aortic valve is normal in structure and function. Aortic valve regurgitation is trivial. Mild to moderate aortic valve sclerosis/calcification is present, without any evidence of aortic stenosis.  6. The inferior vena cava is normal in size with greater than 50% respiratory variability, suggesting right atrial pressure of 3 mmHg. FINDINGS  Left Ventricle: Left ventricular ejection fraction, by estimation, is 30 to 35%. The left ventricle has moderately decreased function. The left ventricle demonstrates global hypokinesis. The left ventricular internal cavity size was normal in size. There is no left ventricular hypertrophy. Left ventricular diastolic parameters are indeterminate. Right Ventricle: The right ventricular size is normal. No increase in right ventricular wall thickness. Right ventricular systolic function is normal. Tricuspid regurgitation signal is inadequate for assessing PA pressure. Left Atrium: Left atrial size was mild to moderately dilated. Right Atrium: Right atrial size was normal in size. Pericardium: There is no evidence of pericardial effusion. Mitral Valve: The mitral valve is normal in structure and function. Normal mobility of the mitral valve leaflets. Moderate mitral valve regurgitation. No evidence of mitral valve stenosis. MV peak gradient, 7.5 mmHg. The mean mitral valve gradient is 4.0  mmHg. Tricuspid Valve: The tricuspid valve is normal in structure. Tricuspid valve regurgitation is not demonstrated. No evidence of tricuspid stenosis. Aortic Valve: The aortic valve is normal in structure and function. Aortic valve regurgitation is trivial. Aortic  regurgitation PHT measures 281 msec. Mild to moderate aortic valve sclerosis/calcification is present, without any evidence of aortic stenosis. Aortic valve mean gradient measures 5.0 mmHg. Aortic valve peak gradient measures 8.5 mmHg. Aortic valve area, by VTI measures 2.53 cm. Pulmonic Valve: The pulmonic valve was normal in structure. Pulmonic valve regurgitation is not visualized. No evidence of pulmonic stenosis. Aorta: The aortic root is normal in size and structure. Venous: The inferior vena cava is normal in size with greater than 50% respiratory variability, suggesting right atrial pressure of 3 mmHg. IAS/Shunts: No atrial level shunt detected by color flow Doppler.  LEFT VENTRICLE PLAX 2D LVIDd:         5.21 cm  Diastology LVIDs:         4.21 cm  LV e' lateral:   7.83 cm/s LV PW:         0.84 cm  LV E/e' lateral: 13.4 LV IVS:        0.89 cm  LV e' medial:    7.94 cm/s LVOT diam:     2.40 cm  LV E/e' medial:  13.2 LV SV:         54 LV SV Index:   26 LVOT Area:     4.52 cm  RIGHT VENTRICLE RV Basal diam:  3.08 cm LEFT ATRIUM             Index       RIGHT ATRIUM  Index LA diam:        4.70 cm 2.28 cm/m  RA Area:     14.10 cm LA Vol (A2C):   62.0 ml 30.08 ml/m RA Volume:   35.20 ml  17.08 ml/m LA Vol (A4C):   68.6 ml 33.29 ml/m LA Biplane Vol: 65.3 ml 31.68 ml/m  AORTIC VALVE                    PULMONIC VALVE AV Area (Vmax):    2.95 cm     PV Vmax:       1.10 m/s AV Area (Vmean):   2.86 cm     PV Vmean:      68.400 cm/s AV Area (VTI):     2.53 cm     PV VTI:        0.146 m AV Vmax:           146.00 cm/s  PV Peak grad:  4.8 mmHg AV Vmean:          103.000 cm/s PV Mean grad:  2.0 mmHg AV VTI:            0.213 m AV Peak Grad:      8.5 mmHg AV Mean Grad:      5.0 mmHg LVOT Vmax:         95.30 cm/s LVOT Vmean:        65.200 cm/s LVOT VTI:          0.119 m LVOT/AV VTI ratio: 0.56 AI PHT:            281 msec  AORTA Ao Root diam: 3.00 cm MITRAL VALVE MV Area (PHT): 3.92 cm     SHUNTS MV Peak grad:   7.5 mmHg     Systemic VTI:  0.12 m MV Mean grad:  4.0 mmHg     Systemic Diam: 2.40 cm MV Vmax:       1.37 m/s MV Vmean:      92.7 cm/s MV Decel Time: 194 msec MV E velocity: 104.80 cm/s Kathlyn Sacramento MD Electronically signed by Kathlyn Sacramento MD Signature Date/Time: 03/04/2019/2:46:43 PM    Final      Medications:   . amiodarone 30 mg/hr (03/06/19 0729)  . dextrose 5% lactated ringers 75 mL/hr at 03/06/19 0729  . diltiazem (CARDIZEM) infusion Stopped (03/03/19 2026)  . diltiazem (CARDIZEM) infusion 10 mg/hr (03/03/19 1815)  . heparin 1,950 Units/hr (03/06/19 0739)  . piperacillin-tazobactam (ZOSYN)  IV    . small volume/piggyback builder 50 mL/hr at 03/05/19 1113   . aspirin EC  325 mg Oral Daily  . chlorhexidine  15 mL Mouth Rinse BID  . Chlorhexidine Gluconate Cloth  6 each Topical Daily  . dabrafenib mesylate  150 mg Oral BID  . dextromethorphan-guaiFENesin  1 tablet Oral BID  . dronabinol  5 mg Oral BID AC  . DULoxetine  30 mg Oral Daily  . insulin aspart  0-15 Units Subcutaneous Q4H  . insulin glargine  35 Units Subcutaneous Q24H  . ipratropium-albuterol  3 mL Nebulization Q6H  . LORazepam  0-4 mg Intravenous Q12H  . mouth rinse  15 mL Mouth Rinse q12n4p  . metoprolol tartrate  25 mg Oral BID  . multivitamin with minerals  1 tablet Oral Daily  . nicotine  21 mg Transdermal Daily  . rosuvastatin  10 mg Oral Daily  . trametinib dimethyl sulfoxide  2 mg Oral Daily  . vancomycin variable dose per unstable renal function (  pharmacist dosing)   Does not apply See admin instructions   acetaminophen, albuterol, dextrose, LORazepam **OR** LORazepam, ondansetron (ZOFRAN) IV, pneumococcal 23 valent vaccine  Assessment/ Plan:  63 y.o. male with hypertension, hyperlipidemia, diabetes, depression, anxiety, adenocarcinoma of the lung stage IV, coronary disease, history of CABG, tobacco and alcohol abuse, left pleural effusion,  admitted on 03/03/2019 for DKA (diabetic ketoacidoses) ([place])  [E11.10] New onset atrial fibrillation ([place]) [I48.91] Atrial fibrillation with rapid ventricular response ([place]) [I48.91] Diabetic ketoacidosis without coma associated with other specified diabetes mellitus ([place]) [E13.10]     #Acute kidney injury Baseline creatinine of 0.7 on February 5.  Patient did have IV contrast exposure on January 19. Currently he is nonoliguric with worsening renal parameters Renal imaging: Kidney ultrasound March 1: Normal kidneys Acute kidney injury is likely secondary to ATN caused by hypotension, hemodynamic instability. Avoid further hypotension Gentle IV hydration Supportive care Today's creatinine has increased to 4.22.  but UOP improved No acute indication for dialysis at present but may need this admission  #Metabolic acidosis Today's bicarb level 14 Can consider changing IV fluids to sodium bicarbonate supplementation  #Hyponatremia Likely multifactorial with contribution from acute kidney injury and also possibility of liver disease from reported alcohol abuse  #Diabetic ketoacidosis, poorly controlled diabetes Management as per ICU team Lab Results  Component Value Date   HGBA1C 11.3 (H) 03/04/2019   Multiple underlying chronic illnesses, poorly controlled diabetes, reported history of tobacco and alcohol abuse, with underlying coronary disease, history of CABG, CHF with EF 30 to 35%, stage IV adenocarcinoma      LOS: [place] 3/3/20219:08 AM  [place], [place]  Note: This note was prepared with Dragon dictation. Any transcription errors are unintentional

## 2019-03-06 NOTE — Progress Notes (Signed)
Initial Nutrition Assessment  DOCUMENTATION CODES:   Severe malnutrition in context of chronic illness  INTERVENTION:  Provide Ensure Max Protein po BID, each supplement provides 150 kcal and 30 grams of protein.  Encouraged adequate intake at meals.  NUTRITION DIAGNOSIS:   Severe Malnutrition related to chronic illness(stage IV lung adenocarcinoma, CHF, EtOH abuse) as evidenced by 11.9% weight loss over 5 months, severe fat depletion, moderate-severe muscle depletion.  GOAL:   Patient will meet greater than or equal to 90% of their needs  MONITOR:   PO intake, Supplement acceptance, Labs, Weight trends, I & O's  REASON FOR ASSESSMENT:   Malnutrition Screening Tool    ASSESSMENT:   64 year old male with PMHx of CAD s/p CABG 2004, HTN, DM, HLD, anxiety, arthritis, stage IV lung adenocarcinoma admitted with new onset A-fib with RVR, acute tubular necrosis, acute metabolic encephalopathy, NSTEMI, acute systolic CHF, DKA, EtOH abuse.   -Following SLP evaluation this afternoon diet was advanced to dysphagia 1 with thin liquids.  Met with patient and his wife at bedside this morning. Patient has had decreased appetite for a while now. They report he was only eating bites at meals. He was NPO earlier today pending SLP evaluation but diet has now been advanced. Patient has been drinking a high-protein ONS at home (sounds like Premier Protein) and would like to drink them here as well. Our ONS similar to this is Ensure Max Protein.  Patient reports his UBW was 230 lbs. According to chart patient last weighed this >1 year ago in 07/2017. Patient was 210 lbs on 10/11/2018.  Patient is currently 83.9 kg (184.97 lbs). He has lost 25 lbs (11.9% body weight) over the past 5 months, which is significant for time frame.  Medications reviewed and include: Marinol 5 mg BID before meals, Novolog 0-15 units Q4hrs, Lantus 40 units daily, Ativan, MVI, nicotine patch, amiodarone, D5W at 75 mL/hr, heparin  gtt, Zosyn, thiamine 983 mg IV, folic acid 1 mg IV.   Labs reviewed: CBG 154-200, Sodium 129, Potassium 3.3, CO2 14, BUN 75, Creatinine 4.22.  NUTRITION - FOCUSED PHYSICAL EXAM:    Most Recent Value  Orbital Region  Severe depletion  Upper Arm Region  Severe depletion  Thoracic and Lumbar Region  Moderate depletion  Buccal Region  Severe depletion  Temple Region  Severe depletion  Clavicle Bone Region  Severe depletion  Clavicle and Acromion Bone Region  Moderate depletion  Scapular Bone Region  Moderate depletion  Dorsal Hand  Moderate depletion  Patellar Region  Severe depletion  Anterior Thigh Region  Severe depletion  Posterior Calf Region  Severe depletion  Edema (RD Assessment)  Mild  Hair  Reviewed  Eyes  Reviewed  Mouth  Reviewed  Skin  Reviewed  Nails  Reviewed     Diet Order:   Diet Order            DIET - DYS 1 Room service appropriate? Yes with Assist; Fluid consistency: Thin  Diet effective now             EDUCATION NEEDS:   No education needs have been identified at this time  Skin:  Skin Assessment: Reviewed RN Assessment  Last BM:  03/03/2019  Height:   Ht Readings from Last 1 Encounters:  03/03/19 6' (1.829 m)   Weight:   Wt Readings from Last 1 Encounters:  03/03/19 83.9 kg   Ideal Body Weight:  80.9 kg  BMI:  Body mass index is 25.09 kg/m.  Estimated Nutritional Needs:   Kcal:  2500-2800  Protein:  100 grams  Fluid:  per MD  Jacklynn Barnacle, MS, RD, LDN Pager number available on Amion

## 2019-03-06 NOTE — TOC Initial Note (Signed)
Transition of Care [facility]) - Initial/Assessment Note    Patient Details  Name: Craig Koch MRN: [id number] Date of Birth: [birth year]  Transition of Care [facility]) CM/SW Contact:    Craig Ivan, LCSW Phone Number: 03/06/2019, 1:21 PM  Clinical Narrative:                CSW met with patient's wife (Craig Koch) at bedside. Patient was asleep. Explained CSW role. Craig Koch reported patient sees Dr. Damita Koch for primary care. Craig Koch provides transportation to and from appointments. [facility] [place] denied any issues with obtaining medications. Craig Koch reported patient does not currently have any equipment at home and has not used home health services in the past. CSW will continue to follow.   Expected Discharge Plan: Home/Self Care Barriers to Discharge: Continued Medical Work up   Patient Goals and CMS Choice        Expected Discharge Plan and Services Expected Discharge Plan: Home/Self Care                                              Prior Living Arrangements/Services   Lives with:: Spouse Patient language and need for interpreter reviewed:: Yes Do you feel safe going back to the place where you live?: Yes      Need for Family Participation in Patient Care: Yes (Comment) Care giver support system in place?: Yes (comment)   Criminal Activity/Legal Involvement Pertinent to Current Situation/Hospitalization: No - Comment as needed  Activities of Daily Living Home Assistive Devices/Equipment: None ADL Screening (condition at time of admission) Patient's cognitive ability adequate to safely complete daily activities?: Yes Is the patient deaf or have difficulty hearing?: Yes Does the patient have difficulty seeing, even when wearing glasses/contacts?: No Does the patient have difficulty concentrating, remembering, or making decisions?: No Patient able to express need for assistance with ADLs?: Yes Does the patient have difficulty dressing or bathing?:  No Independently performs ADLs?: No Communication: Independent Dressing (OT): Independent Grooming: Independent Feeding: Independent Bathing: Independent Toileting: Independent In/Out Bed: Independent Walks in Home: Independent Does the patient have difficulty walking or climbing stairs?: No Weakness of Legs: None Weakness of Arms/Hands: None  Permission Sought/Granted                  Emotional Assessment Appearance:: Appears stated age Attitude/Demeanor/Rapport: Other (comment)(Patient was asleep.)       Psych Involvement: No (comment)  Admission diagnosis:  DKA (diabetic ketoacidoses) ([place]) [E11.10] New onset atrial fibrillation ([place]) [I48.91] Atrial fibrillation with rapid ventricular response ([place]) [I48.91] Diabetic ketoacidosis without coma associated with other specified diabetes mellitus ([place]) [E13.10] Patient Active Problem List   Diagnosis Date Noted  . Acute alteration in mental status   . Palliative care encounter   . Atrial fibrillation with RVR ([place]) 03/03/2019  . DKA (diabetic ketoacidoses) ([place]) 03/03/2019  . Diabetes mellitus without complication ([place]) 79/03/8331  . Hypothermia 03/03/2019  . SOB (shortness of breath) 03/03/2019  . AKI (acute kidney injury) ([place]) 03/03/2019  . NSTEMI (non-ST elevated myocardial infarction) ([place]) 03/03/2019  . Hyponatremia 02/08/2019  . Uncontrolled type 2 diabetes mellitus with hyperglycemia ([place]) 02/08/2019  . Leg swelling 02/08/2019  . Paronychia, finger 12/19/2018  . Encounter for antineoplastic chemotherapy 12/08/2018  . Tobacco abuse 10/12/2018  . Malignant pleural effusion   . Administrative encounter 09/23/2018  . lung adenocarcinoma 09/08/2018  .  Goals of care, counseling/discussion 09/08/2018  . Recurrent pleural effusion on left 09/02/2018  . Elevated troponin 08/24/2018  . Medicare annual wellness visit, subsequent 08/07/2018  . Lower urinary tract symptoms (LUTS) 08/07/2018  . Neck pain 08/07/2018   . CAD (coronary artery disease), native coronary artery 09/23/2017  . Smoker 09/23/2017  . Chronic joint pain 10/18/2016  . Healthcare maintenance 07/17/2016  . Advance care planning 07/17/2016  . Fungal rash of torso 05/06/2016  . Trigger finger 05/06/2016  . History of alcohol use 11/17/2015  . Skin lesion 05/19/2015  . Anxiety state 02/19/2015  . Hand weakness 05/23/2011  . Diabetes mellitus with complication ([place]) 82/09/9066  . HLD (hyperlipidemia) 03/05/2010  . Essential hypertension 03/05/2010  . Coronary atherosclerosis 03/05/2010   PCP:  Craig Ghent, MD Pharmacy:   [id number] [place] [address]Phone: [phone]: [address]Phone: [phone]: [phone]    Social Determinants of Health (SDOH) Interventions    Readmission Risk Interventions Readmission Risk Prevention Plan 03/06/2019  Transportation Screening Complete  PCP or Specialist Appt within 3-5 Days Complete  Medication Review (RN Care Manager) Complete  Some recent data might be hidden

## 2019-03-06 NOTE — Progress Notes (Signed)
Inpatient Diabetes Program Recommendations  AACE/ADA: New Consensus Statement on Inpatient Glycemic Control   Target Ranges:  Prepandial:   less than 140 mg/dL      Peak postprandial:   less than 180 mg/dL (1-2 hours)      Critically ill patients:  140 - 180 mg/dL   Results for SPURGEON, GANCARZ (MRN [id number]) as of 03/06/2019 08:09  Ref. Range 03/05/2019 07:19 03/05/2019 11:19 03/05/2019 16:39 03/05/2019 19:35 03/05/2019 23:44 03/06/2019 03:22 03/06/2019 07:21  Glucose-Capillary Latest Ref Range: 70 - 99 mg/dL 216 (H) 178 (H) 144 (H) 168 (H) 179 (H) 183 (H) 200 (H)    Review of Glycemic Control  Diabetes history:DM2 Outpatient Diabetes medications:[id number], Humalog 20 units TID with meals(notes he skips Humalog sometimes), Metformin 500 mg BID Current orders for Inpatient glycemic control: Lantus 35 units Q24H, Novolog 0-15 units Q4H  Inpatient Diabetes Program Recommendations:   Insulin-Please consider increasing Lantus to 40 units Q24H.  Thanks, Barnie Alderman, RN, MSN, CDE Diabetes Coordinator Inpatient Diabetes Program [phone] (Team Pager from 8am to 5pm)

## 2019-03-06 NOTE — Evaluation (Signed)
Clinical/Bedside Swallow Evaluation Patient Details  Name: Craig Koch MRN: [id number] Date of Birth: 03-Sep-1955  Today's Date: 03/06/2019 Time: SLP Start Time (ACUTE ONLY): 85 SLP Stop Time (ACUTE ONLY): 1425 SLP Time Calculation (min) (ACUTE ONLY): 50 min  Past Medical History:  Past Medical History:  Diagnosis Date  . Anxiety   . Arthritis   . Coronary artery disease   . Diabetes mellitus   . Dyslipidemia   . Hx of CABG   . Hyperlipidemia   . Hypertension   . Malignant neoplasm of unspecified part of unspecified bronchus or lung ([place]) 08/2018   Immunotherapy  . Pleural effusion    Past Surgical History:  Past Surgical History:  Procedure Laterality Date  . CATARACT EXTRACTION Left 09/2015  . CHEST TUBE INSERTION Left 10/01/2018   Procedure: INSERTION PLEURAL DRAINAGE CATHETER;  Surgeon: [name], MD;  Location: [facility] ORS;  Service: Thoracic;  Laterality: Left;  . CORONARY ARTERY BYPASS GRAFT  2004   (CABG with LIMA to the  LAD, SVG to OM2/OM3, SVG  to diag  . Left ankle surgery     repair of fracture  . Right lower leg surgery     rod   HPI:  Pt is a 64 y.o. male with medical history significant of hypertension, hyperlipidemia, diabetes mellitus, depression, anxiety, lung cancer, CAD, CABG, tobacco abuse, alcohol abuse, left pleural effusion, who presents with shortness of breath and generalized weakness.  Patient states that he has been having shortness of breath, dry cough and generalized weakness the past several days, which has worsened today.  Denies chest pain, fever or chills.  He states that he normally has O2 saturation at about the 90s.  Patient is using accessory muscle for breathing.  Denies nausea, no vomiting, diarrhea, abdominal pain.  Denies symptoms of UTI or unilateral weakness.  No recent fall.  Pt was admitted w/ dx of New onset atrial fibrillation with RVR; DKA, AKI.  Per Palliative Oncology note: "stage IV adenocarcinoma of the lung with history of  left sided malignant pleural effusion requiring Pleurx (later removed).  Patient has opted not to pursue chemotherapy and has instead been treated on immunotherapy.  Patient has had persistent shortness of breath.  CTA of the chest on 11/02/2018 revealed marked increase in tumor encasing the entire left lung with new diffuse increased interstitial thickening concerning for disease progression and lymphangitic spread of disease.    Repeat CT of the chest and abdomen on 01/22/2019 revealed significant interval decrease of tumor burden on the lung.".   Assessment / Plan / Recommendation Clinical Impression  Pt appears to present w/ grossly adequate oropharyngeal phase swallowing function and bolus management during oral intake of trials given. No solid foods were tested d/t pt's overall weakness and for conservation of energy. Pt presented w/ significant weakness; min drowsiness as well. He has required Medication for agitation during admission in CCU. Noted pt's Lung Ca dx -- any SOB can impact timing of pharyngeal swallow. Also noted ETOH abuse history which can increase potential Reflux s/s -- pt exhibited frequent Belching w/ po trials at evaluation.  Pt required full positioning support to sit upright for po trials. Pt then consumed trials of thin liquids VIA CUP, purees w/ No overt clinical s/s of aspiration noted; O2 sats remained 97-99%. No coughing or wet vocal quality noted during/post trials. Oral phase grossly [facility] for bolus mangement of consistencies given; min increased manipulation time noted intermittently w/ purees. Oral clearing achieved b/t trials w/  Time given. OM exam appeared [facility] w/ no unilateral weakness noted. Pt required feeding support d/t weakness. Belching noted frequently during/post oral intake - concern for s/s of Reflux. Recommend initiation of an oral diet of Dysphagia level 1 (puree) w/ thin liquids; aspiration precautions; Pills in puree for safer swallowing - Crushed as needed.  Feeding Support at all meals. Monitoring for toleration of po's. ST services will continue to f/u for toleration of diet and diet consistency(foods) upgrade as pt's medical status improves. Recommend Dietician f/u. Precautions posted. NSG updated.  SLP Visit Diagnosis: Dysphagia, oral phase (R13.11)(weakness)    Aspiration Risk  Mild aspiration risk;Risk for inadequate nutrition/hydration    Diet Recommendation  Dysphagia level 1 (puree w/ gravies to moisten); Thin liquids VIA CUP. No Straws. Aspiration precautions; Reflux precautions. Feeding support and Supervision w/ oral intake.  Medication Administration: Crushed with puree(as needed for safer swallowing)    Other  Recommendations Recommended Consults: (Dietician f/u for support) Oral Care Recommendations: Oral care BID;Oral care before and after PO;Staff/trained caregiver to provide oral care Other Recommendations: (n/a)   Follow up Recommendations None(TBD)      Frequency and Duration min 3x week  2 weeks       Prognosis Prognosis for Safe Diet Advancement: Fair(-Good) Barriers to Reach Goals: Time post onset;Severity of deficits      Swallow Study   General Date of Onset: 03/03/19 HPI: Pt is a 64 y.o. male with medical history significant of hypertension, hyperlipidemia, diabetes mellitus, depression, anxiety, lung cancer, CAD, CABG, tobacco abuse, alcohol abuse, left pleural effusion, who presents with shortness of breath and generalized weakness.  Patient states that he has been having shortness of breath, dry cough and generalized weakness the past several days, which has worsened today.  Denies chest pain, fever or chills.  He states that he normally has O2 saturation at about the 90s.  Patient is using accessory muscle for breathing.  Denies nausea, no vomiting, diarrhea, abdominal pain.  Denies symptoms of UTI or unilateral weakness.  No recent fall.  Pt was admitted w/ dx of New onset atrial fibrillation with RVR; DKA,  AKI.  Per Palliative Oncology note: "stage IV adenocarcinoma of the lung with history of left sided malignant pleural effusion requiring Pleurx (later removed).  Patient has opted not to pursue chemotherapy and has instead been treated on immunotherapy.  Patient has had persistent shortness of breath.  CTA of the chest on 11/02/2018 revealed marked increase in tumor encasing the entire left lung with new diffuse increased interstitial thickening concerning for disease progression and lymphangitic spread of disease.    Repeat CT of the chest and abdomen on 01/22/2019 revealed significant interval decrease of tumor burden on the lung.". Type of Study: Bedside Swallow Evaluation Previous Swallow Assessment: none reported Diet Prior to this Study: NPO(regular diet at home) Temperature Spikes Noted: No(wbc 7.8) Respiratory Status: Nasal cannula(2-4L) History of Recent Intubation: No Behavior/Cognition: Alert;Cooperative;Pleasant mood;Distractible;Requires cueing(has been agitated requiring Ativan per notes) Oral Cavity Assessment: Dry;Dried secretions Oral Care Completed by SLP: Yes Oral Cavity - Dentition: (Dentures) Vision: Functional for self-feeding Self-Feeding Abilities: Able to feed self;Needs assist;Needs set up;Total assist(weakness) Patient Positioning: Upright in bed(needed full positioning support) Baseline Vocal Quality: Normal;Low vocal intensity Volitional Cough: Strong Volitional Swallow: Able to elicit    Oral/Motor/Sensory Function Overall Oral Motor/Sensory Function: Within functional limits   Ice Chips Ice chips: Within functional limits Presentation: Spoon(fed; 5 trials)   Thin Liquid Thin Liquid: Within functional limits Presentation: Cup;Self Fed(~3-4 ozs  total)    Nectar Thick Nectar Thick Liquid: Not tested   Honey Thick Honey Thick Liquid: Not tested   Puree Puree: Within functional limits Presentation: Spoon(fed; 8 trials accepted) Other Comments: declined further    Solid     Solid: Not tested Other Comments: for conservation of energy       Orinda Kenner, MS, CCC-SLP Willamae Demby 03/06/2019,3:38 PM

## 2019-03-06 NOTE — Progress Notes (Signed)
Patient waking up and responding much better today. Oriented to person, place and situation. Speech clearing and able to better communicate. Speech consulted and approved for diet- still requires much assistance but able to swallow a few pills. Amio D/C'd, back in NSR

## 2019-03-06 NOTE — Progress Notes (Signed)
Progress Note  Patient Name: Craig Koch Date of Encounter: 03/06/2019  Primary Cardiologist: Dr. Rockey Situ  Subjective   Patient is very somnolent this morning.  Will not open his eyes when I tried talking to him.  Inpatient Medications    Scheduled Meds: . aspirin EC  325 mg Oral Daily  . chlorhexidine  15 mL Mouth Rinse BID  . Chlorhexidine Gluconate Cloth  6 each Topical Daily  . dextromethorphan-guaiFENesin  1 tablet Oral BID  . dronabinol  5 mg Oral BID AC  . DULoxetine  30 mg Oral Daily  . insulin aspart  0-15 Units Subcutaneous Q4H  . insulin glargine  40 Units Subcutaneous Daily  . ipratropium-albuterol  3 mL Nebulization Q6H  . LORazepam  0-4 mg Intravenous Q12H  . mouth rinse  15 mL Mouth Rinse q12n4p  . metoprolol tartrate  25 mg Oral BID  . multivitamin with minerals  1 tablet Oral Daily  . nicotine  21 mg Transdermal Daily  . rosuvastatin  10 mg Oral Daily  . vancomycin variable dose per unstable renal function (pharmacist dosing)   Does not apply See admin instructions   Continuous Infusions: . amiodarone 30 mg/hr (03/06/19 1149)  . dextrose 5% lactated ringers 75 mL/hr at 03/06/19 0729  . diltiazem (CARDIZEM) infusion Stopped (03/03/19 2026)  . diltiazem (CARDIZEM) infusion 10 mg/hr (03/03/19 1815)  . heparin 1,950 Units/hr (03/06/19 0739)  . piperacillin-tazobactam (ZOSYN)  IV    . small volume/piggyback builder 50 mL/hr at 03/06/19 1144   PRN Meds: acetaminophen, albuterol, dextrose, LORazepam **OR** LORazepam, ondansetron (ZOFRAN) IV, pneumococcal 23 valent vaccine   Vital Signs    Vitals:   03/06/19 1000 03/06/19 1100 03/06/19 1150 03/06/19 1200  BP: 111/63 100/71  (!) 93/57  Pulse: 92 87  70  Resp: (!) 26 (!) 26 (!) 29 (!) 28  Temp:   98 F (36.7 C)   TempSrc:   Oral   SpO2: 95% 95%  96%  Weight:      Height:        Intake/Output Summary (Last 24 hours) at 03/06/2019 1254 Last data filed at 03/06/2019 1150 Gross per 24 hour  Intake  2040.2 ml  Output 575 ml  Net 1465.2 ml   Last 3 Weights 03/03/2019 03/03/2019 02/14/2019  Weight (lbs) 184 lb 15.5 oz 190 lb 191 lb  Weight (kg) 83.9 kg 86.183 kg 86.637 kg      Telemetry    Sinus rhythm, heart rate 92- Personally Reviewed  ECG    New tracing obtained- Personally Reviewed  Physical Exam   GEN:  Very somnolent, does not respond to commands Neck: No JVD Cardiac: RRR, no murmurs, rubs, or gallops.  Respiratory:  Rhonchorous breath sounds GI: Soft, nontender, non-distended  MS: No edema; No deformity. Neuro:  Nonfocal  Psych: Somnolent, not able to arouse patient  Labs    High Sensitivity Troponin:   Recent Labs  Lab 03/03/19 1547 03/03/19 1750  TROPONINIHS 13,356* >27,000*      Chemistry Recent Labs  Lab 03/03/19 1547 03/03/19 1547 03/03/19 1750 03/03/19 1936 03/04/19 1059 03/05/19 0153 03/06/19 0152  NA 123*   < > 123*   < > 127* 128* 129*  K 5.1   < > 5.4*   < > 3.7 3.5 3.3*  CL 89*   < > 89*   < > 102 102 102  CO2 <7*   < > 8*   < > 14* 15* 14*  GLUCOSE 718*   < >  756*   < > 134* 311* 182*  BUN 37*   < > 43*   < > 53* 65* 75*  CREATININE 1.60*   < > 1.79*   < > 2.13* 3.16* 4.22*  CALCIUM 8.2*   < > 8.2*   < > 8.0* 8.0* 8.2*  PROT 6.6  --  7.5  --   --   --   --   ALBUMIN 2.9*  --  3.2*  --   --   --   --   AST 81*  --  136*  --   --   --   --   ALT 18  --  25  --   --   --   --   ALKPHOS 80  --  104  --   --   --   --   BILITOT 2.3*  --  2.4*  --   --   --   --   GFRNONAA 45*   < > 39*   < > 32* 20* 14*  GFRAA 52*   < > 46*   < > 37* 23* 16*  ANIONGAP NOT CALCULATED   < > 26*   < > 11 11 13    < > = values in this interval not displayed.     Hematology Recent Labs  Lab 03/04/19 1059 03/05/19 0153 03/06/19 0152  WBC 8.5 8.5 7.8  RBC 4.44 4.00* 4.02*  HGB 12.2* 11.1* 11.0*  HCT 34.2* 31.1* 31.0*  MCV 77.0* 77.8* 77.1*  MCH 27.5 27.8 27.4  MCHC 35.7 35.7 35.5  RDW 16.4* 17.1* 17.4*  PLT 210 189 204    BNP Recent Labs    Lab 03/03/19 1330  BNP 330.0*     DDimer No results for input(s): DDIMER in the last 168 hours.   Radiology    CT HEAD WO CONTRAST  Result Date: 03/05/2019 CLINICAL DATA:  Metastatic disease evaluation.  Lung cancer. EXAM: CT HEAD WITHOUT CONTRAST TECHNIQUE: Contiguous axial images were obtained from the base of the skull through the vertex without intravenous contrast. COMPARISON:  MRI head 09/20/2018 FINDINGS: Brain: Mild atrophy. Mild hypodensity in the white matter unchanged from the prior MRI. No acute infarct, hemorrhage, mass. No edema or midline shift. Vascular: Negative for hyperdense vessel Skull: Negative skull. Sinuses/Orbits: Mucosal edema and bony thickening right maxillary sinus. Chronic nasal bone fracture. Left cataract extraction. Other: None IMPRESSION: No acute abnormality and negative for metastatic disease on unenhanced CT. Atrophy and mild chronic microvascular ischemia. Electronically Signed   By: Franchot Gallo M.D.   On: 03/05/2019 15:28   US RENAL  Result Date: 03/04/2019 CLINICAL DATA:  Acute kidney injury EXAM: RENAL / URINARY TRACT ULTRASOUND COMPLETE COMPARISON:  CT 01/22/2019 FINDINGS: Right Kidney: Renal measurements: 11.4 x 6 x 6 cm = volume: 215 mL . Echogenicity within normal limits. No mass or hydronephrosis visualized. Left Kidney: Renal measurements: 11.8 x 7.3 x 6.5 cm = volume: 291.8 mL. Echogenicity within normal limits. No mass or hydronephrosis visualized. Bladder: Appears normal for degree of bladder distention. Other: Incidental note made of gallstones and small effusions. Heterogenous prostate. IMPRESSION: Normal ultrasound appearance of the kidneys Electronically Signed   By: Donavan Foil M.D.   On: 03/04/2019 15:37    Cardiac Studies   Echo 1. Left ventricular ejection fraction, by estimation, is 30 to 35%. The  left ventricle has moderately decreased function. The left ventricle  demonstrates global hypokinesis. Left ventricular diastolic  parameters  are  indeterminate.  2. Right ventricular systolic function is normal. The right ventricular  size is normal. Tricuspid regurgitation signal is inadequate for assessing  PA pressure.  3. Left atrial size was mild to moderately dilated.  4. The mitral valve is normal in structure and function. Moderate mitral  valve regurgitation. No evidence of mitral stenosis.  5. The aortic valve is normal in structure and function. Aortic valve  regurgitation is trivial. Mild to moderate aortic valve  sclerosis/calcification is present, without any evidence of aortic  stenosis.  6. The inferior vena cava is normal in size with greater than 50%  respiratory variability, suggesting right atrial pressure of 3 mmHg.   Patient Profile     64 y.o. male with history of CAD/CABG x4, diabetes, hypertension, stage IV lung cancer, malignant pleural effusions presenting with shortness of breath, found to have atrial fibrillation with rapid ventricular response and non-ST elevated MI.  Assessment & Plan    1.  Paroxysmal A. fib with RVR -Currently in sinus rhythm -On amiodarone drip.  Continue amiodarone drip.  Switch to oral amiodarone when patient able to tolerate p.o.  He is very somnolent at this point. -Heparin drip  2.  NSTEMI, history of CAD/CABG -Not a candidate for ischemic work-up given his comorbidities and current condition -Commend Heparin infusion x48 hours total -Continue PTA aspirin, Crestor, beta-blocker  3.  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  Prognosis remains guarded.    Signed, Kate Sable, MD  03/06/2019, 12:54 PM

## 2019-03-06 NOTE — Consult Note (Signed)
Pharmacy Antibiotic Note  Craig Koch is a 64 y.o. male admitted on 03/03/2019 with medical history significant ofhypertension, hyperlipidemia, diabetes mellitus, depression, anxiety, lung cancer, CAD, CABG, tobacco abuse, alcohol abuse, left pleural effusion,who presents with new onset atrial fibillation and now with possible sepsis. Pharmacy has been consulted for vancomycin and Zosyn dosing.  Plan: 1) Transition Zosyn 3.375g IV to q12 h (4 hour infusion)  2) Vancomycin 1500 mg IV x 1   Given worsening renal function I will order a random vancomycin level in the morning to guide clearance and further dosing  BMP in am  Height: 6' (182.9 cm) Weight: 184 lb 15.5 oz (83.9 kg) IBW/kg (Calculated) : 77.6  Temp (24hrs), Avg:98.7 F (37.1 C), Min:97.8 F (36.6 C), Max:100.2 F (37.9 C)  Recent Labs  Lab 03/03/19 1330 03/03/19 1547 03/03/19 2359 03/04/19 0526 03/04/19 1059 03/05/19 0153 03/06/19 0152  WBC 9.4  --  9.9  --  8.5 8.5 7.8  CREATININE  --    < > 1.98* 1.88* 2.13* 3.16* 4.22*  VANCORANDOM  --   --   --   --   --   --  16   < > = values in this interval not displayed.    Estimated Creatinine Clearance: 19.7 mL/min (A) (by C-G formula based on SCr of 4.22 mg/dL (H)).    Antimicrobials this admission: vancomycin 3/2 >>  Zosyn 3/2 >>   Adjustment:  3/3 Zosyn transitioned to 3.375g IV Q12hr.   Microbiology results: 3/2 BCx: no growth < 24 hours  2/28 SARS CoV-2: negative  2/28 influenza A/B: negative 2/28 MRSA PCR: negative  Thank you for allowing pharmacy to be a part of this patient's care.  Craig Koch 03/06/2019 3:23 PM

## 2019-03-06 NOTE — Consult Note (Signed)
ANTICOAGULATION CONSULT NOTE  Pharmacy Consult for Heparin  Indication: atrial fibrillation- new onset  Patient Measurements: Height: 6' (182.9 cm) Weight: 184 lb 15.5 oz (83.9 kg) IBW/kg (Calculated) : 77.6 Heparin Dosing Weight: 86.2 kg   Vital Signs: Temp: 98 F (36.7 C) (03/03 1150) Temp Source: Oral (03/03 1150) BP: 89/53 (03/03 1600) Pulse Rate: 63 (03/03 1600)  Labs: Recent Labs    03/03/19 1750 03/03/19 1936 03/04/19 1059 03/04/19 1849 03/05/19 0153 03/05/19 0758 03/06/19 0152 03/06/19 0855 03/06/19 1449  HGB  --    < > 12.2*  --  11.1*  --  11.0*  --   --   HCT  --    < > 34.2*  --  31.1*  --  31.0*  --   --   PLT  --    < > 210  --  189  --  204  --   --   APTT 85*  --   --   --   --   --   --   --   --   LABPROT 14.6  --   --   --   --   --   --   --   --   INR 1.2  --   --   --   --   --   --   --   --   HEPARINUNFRC  --    < > 0.24*   < > 0.33   < > 0.23* 0.37 0.32  CREATININE 1.79*   < > 2.13*  --  3.16*  --  4.22*  --   --   TROPONINIHS >27,000*  --   --   --   --   --   --   --   --    < > = values in this interval not displayed.    Estimated Creatinine Clearance: 19.7 mL/min (A) (by C-G formula based on SCr of 4.22 mg/dL (H)).   Medications:  Called RN to confirm whether pt has been on [facility] prior to admission. Nurse was not able to confirm as she was not in the patient's room. Per chart review, no AC prior to admission and confirmed with pharmacy technician who spoke to patient.   Assessment: NICOLE HAFLEY is a 64 y.o. male with a past medical history of anxiety, CAD, diabetes, and hyperlipidemia, hypertension, lung cancer, presents to the emergency department for shortness of breath. Pharmacy was consulted for heparin dosing in a patient with new onset atrial fibrillation. H&H trending down slightly, platelets are stable  Goal of Therapy:  Heparin level 0.3-0.7 units/ml Monitor platelets by anticoagulation protocol: Yes   Plan:   Continue  heparin at 1950 units/hr.   Heparin level and CBC with am labs.   Pharmacy will continue to monitor and adjust per consult.   Margrete Delude L 03/06/2019 4:22 PM

## 2019-03-06 NOTE — Progress Notes (Signed)
Hematology/Oncology Progress Note [facility] Telephone:([phone] [phone]  Patient Care Team: Tonia Ghent, MD as PCP - General (Family Medicine) Thelma Comp, [place] (Optometry) Telford Nab, RN as Registered Nurse   Name of the patient: Craig Koch  [id number]  13-Dec-1955  Date of visit: 03/06/19   INTERVAL HISTORY-  Wife is at bedside today.   Patient's mental status slightly improved, more alert, he has able to recognize me today and answer simple questions.  He denies pain.  Easily falls back to sleep.   Review of systems- Review of Systems  Unable to perform ROS: Mental status change    Allergies  Allergen Reactions   Lipitor [Atorvastatin Calcium] Other (See Comments)    Aches.  Tolerated crestor.     Patient Active Problem List   Diagnosis Date Noted   Acute alteration in mental status    Palliative care encounter    Atrial fibrillation with RVR ([place]) 03/03/2019   DKA (diabetic ketoacidoses) ([place]) 03/03/2019   Diabetes mellitus without complication ([place]) 96/28/3662   Hypothermia 03/03/2019   SOB (shortness of breath) 03/03/2019   AKI (acute kidney injury) ([place]) 03/03/2019   NSTEMI (non-ST elevated myocardial infarction) ([place]) 03/03/2019   Hyponatremia 02/08/2019   Uncontrolled type 2 diabetes mellitus with hyperglycemia ([place]) 02/08/2019   Leg swelling 02/08/2019   Paronychia, finger 12/19/2018   Encounter for antineoplastic chemotherapy 12/08/2018   Tobacco abuse 10/12/2018   Malignant pleural effusion    Administrative encounter 09/23/2018   lung adenocarcinoma 09/08/2018   Goals of care, counseling/discussion 09/08/2018   Recurrent pleural effusion on left 09/02/2018   Elevated troponin 08/24/2018   Medicare annual wellness visit, subsequent 08/07/2018   Lower urinary tract symptoms (LUTS) 08/07/2018   Neck pain 08/07/2018   CAD (coronary artery disease), native coronary artery  09/23/2017   Smoker 09/23/2017   Chronic joint pain 10/18/2016   Healthcare maintenance 07/17/2016   Advance care planning 07/17/2016   Fungal rash of torso 05/06/2016   Trigger finger 05/06/2016   History of alcohol use 11/17/2015   Skin lesion 05/19/2015   Anxiety state 02/19/2015   Hand weakness 05/23/2011   Diabetes mellitus with complication ([place]) 94/76/5465   HLD (hyperlipidemia) 03/05/2010   Essential hypertension 03/05/2010   Coronary atherosclerosis 03/05/2010     Past Medical History:  Diagnosis Date   Anxiety    Arthritis    Coronary artery disease    Diabetes mellitus    Dyslipidemia    Hx of CABG    Hyperlipidemia    Hypertension    Malignant neoplasm of unspecified part of unspecified bronchus or lung ([place]) 08/2018   Immunotherapy   Pleural effusion      Past Surgical History:  Procedure Laterality Date   CATARACT EXTRACTION Left 09/2015   CHEST TUBE INSERTION Left 10/01/2018   Procedure: INSERTION PLEURAL DRAINAGE CATHETER;  Surgeon: [name], MD;  Location: [facility] ORS;  Service: Thoracic;  Laterality: Left;   CORONARY ARTERY BYPASS GRAFT  2004   (CABG with LIMA to the  LAD, SVG to OM2/OM3, SVG  to diag   Left ankle surgery     repair of fracture   Right lower leg surgery     rod    Social History   Socioeconomic History   Marital status: Married    Spouse name: Not on file   Number of children: Not on file   Years of education: Not on file   Highest education level: Not on file  Occupational History   Not on file  Tobacco Use   Smoking status: Current Every Day Smoker    Packs/day: 0.20    Years: 45.00    Pack years: 9.00    Types: Cigarettes   Smokeless tobacco: Former Systems developer    Types: Snuff  Substance and Sexual Activity   Alcohol use: Yes    Alcohol/week: 6.0 standard drinks    Types: 6 Cans of beer per week    Comment: occ, average 6 pack in a week   Drug use: No   Sexual activity: Not  Currently  Other Topics Concern   Not on file  Social History Narrative   On disability 2009 after prev injuries and CAD.     Married 1976   2 kids, 4 grandkids.    Social Determinants of Health   Financial Resource Strain:    Difficulty of Paying Living Expenses: Not on file  Food Insecurity:    Worried About Charity fundraiser in the Last Year: Not on file   [employer] of Food in the Last Year: Not on file  Transportation Needs:    Lack of Transportation (Medical): Not on file   Lack of Transportation (Non-Medical): Not on file  Physical Activity:    Days of Exercise per Week: Not on file   Minutes of Exercise per Session: Not on file  Stress:    Feeling of Stress : Not on file  Social Connections:    Frequency of Communication with Friends and Family: Not on file   Frequency of Social Gatherings with Friends and Family: Not on file   Attends Religious Services: Not on file   Active Member of Clubs or Organizations: Not on file   Attends Archivist Meetings: Not on file   Marital Status: Not on file  Intimate Partner Violence:    Fear of Current or Ex-Partner: Not on file   Emotionally Abused: Not on file   Physically Abused: Not on file   Sexually Abused: Not on file     Family History  Problem Relation Age of Onset   Dementia Mother    Heart disease Father    Colon cancer Neg Hx    Prostate cancer Neg Hx    Diabetes Neg Hx      Current Facility-Administered Medications:    acetaminophen (TYLENOL) tablet 650 mg, 650 mg, Oral, Q6H PRN, Ivor Costa, MD   albuterol (PROVENTIL) (2.5 MG/3ML) 0.083% nebulizer solution 3 mL, 3 mL, Inhalation, Q4H PRN, Ivor Costa, MD   amiodarone (NEXTERONE PREMIX) 360-4.14 MG/200ML-% (1.8 mg/mL) IV infusion, 30 mg/hr, Intravenous, Continuous, Darel Hong D, NP, Stopped at 03/06/19 1230   [START ON 03/07/2019] aspirin EC tablet 81 mg, 81 mg, Oral, Daily, Agbor-Etang, Brian, MD   chlorhexidine  (PERIDEX) 0.12 % solution 15 mL, 15 mL, Mouth Rinse, BID, Darel Hong D, NP, 15 mL at 03/06/19 1009   Chlorhexidine Gluconate Cloth 2 % PADS 6 each, 6 each, Topical, Daily, Darel Hong D, NP   dextromethorphan-guaiFENesin (Obion DM) 30-600 MG per 12 hr tablet 1 tablet, 1 tablet, Oral, BID, Ivor Costa, MD, 1 tablet at 03/04/19 1030   dextrose 5 % in lactated ringers infusion, , Intravenous, Continuous, Ivor Costa, MD, Last Rate: 75 mL/hr at 03/06/19 1318, Rate Verify at 03/06/19 1318   dextrose 50 % solution 0-50 mL, 0-50 mL, Intravenous, PRN, Ivor Costa, MD   diltiazem (CARDIZEM) 125 mg in dextrose 5% 125 mL (1 mg/mL) infusion, 5-15 mg/hr, Intravenous,  Titrated, Ivor Costa, MD, Stopped at 03/03/19 2026   diltiazem (CARDIZEM) 125 mg in dextrose 5% 125 mL (1 mg/mL) infusion, 5-15 mg/hr, Intravenous, Titrated, Ivor Costa, MD, Last Rate: 10 mL/hr at 03/03/19 1815, 10 mg/hr at 03/03/19 1815   dronabinol (MARINOL) capsule 5 mg, 5 mg, Oral, BID AC, Ivor Costa, MD, 5 mg at 03/04/19 0840   DULoxetine (CYMBALTA) DR capsule 30 mg, 30 mg, Oral, Daily, Ivor Costa, MD, 30 mg at 03/06/19 1006   heparin ADULT infusion 100 units/mL (25000 units/234m sodium chloride 0.45%), 1,950 Units/hr, Intravenous, Continuous, Hall, Scott A, RPH, Last Rate: 19.5 mL/hr at 03/06/19 1318, 1,950 Units/hr at 03/06/19 1318   insulin aspart (novoLOG) injection 0-15 Units, 0-15 Units, Subcutaneous, Q4H, Sreenath, Sudheer B, MD, 3 Units at 03/06/19 1143   insulin glargine (LANTUS) injection 40 Units, 40 Units, Subcutaneous, Daily, Dhungel, Nishant, MD, 5 Units at 03/06/19 1323   ipratropium-albuterol (DUONEB) 0.5-2.5 (3) MG/3ML nebulizer solution 3 mL, 3 mL, Nebulization, Q6H, Sreenath, Sudheer B, MD, 3 mL at 03/06/19 1417   [EXPIRED] LORazepam (ATIVAN) injection 0-4 mg, 0-4 mg, Intravenous, Q6H, 2 mg at 03/05/19 0522 **FOLLOWED BY** LORazepam (ATIVAN) injection 0-4 mg, 0-4 mg, Intravenous, Q12H, NIvor Costa MD    LORazepam (ATIVAN) tablet 1-4 mg, 1-4 mg, Oral, Q1H PRN, 2 mg at 03/04/19 0834 **OR** LORazepam (ATIVAN) injection 1-4 mg, 1-4 mg, Intravenous, Q1H PRN, NIvor Costa MD, 2 mg at 03/04/19 2055   MEDLINE mouth rinse, 15 mL, Mouth Rinse, q12n4p, KDarel HongD, NP, 15 mL at 03/06/19 1553   metoprolol tartrate (LOPRESSOR) tablet 25 mg, 25 mg, Oral, BID, Sreenath, Sudheer B, MD, 25 mg at 03/06/19 1004   multivitamin with minerals tablet 1 tablet, 1 tablet, Oral, Daily, NIvor Costa MD, 1 tablet at 03/06/19 1011   nicotine (NICODERM CQ - dosed in mg/24 hours) patch 21 mg, 21 mg, Transdermal, Daily, NIvor Costa MD, 21 mg at 03/06/19 1009   ondansetron (ZOFRAN) injection 4 mg, 4 mg, Intravenous, Q8H PRN, NIvor Costa MD   piperacillin-tazobactam (ZOSYN) IVPB 3.375 g, 3.375 g, Intravenous, Q12H, GDallie Piles RPH   pneumococcal 23 valent vaccine (PNEUMOVAX-23) injection 0.5 mL, 0.5 mL, Intramuscular, Prior to discharge, SSidney Ace MD   [Derrill MemoON 03/07/2019] protein supplement (ENSURE MAX) liquid, 11 oz, Oral, BID BM, Dhungel, Nishant, MD   rosuvastatin (CRESTOR) tablet 10 mg, 10 mg, Oral, Daily, NIvor Costa MD, 10 mg at 03/06/19 1008   thiamine (B-1) 1409mg, folic acid 1 mg in sodium chloride 0.9 % 50 mL injection, , Intravenous, Q24H, GDallie Piles RPH, Last Rate: 50 mL/hr at 03/06/19 1144, New Bag at 03/06/19 1144   vancomycin variable dose per unstable renal function (pharmacist dosing), , Does not apply, See admin instructions, GDallie Piles [facility]  Physical exam:  Vitals:   03/06/19 1400 03/06/19 1418 03/06/19 1500 03/06/19 1600  BP: (!) 97/56  (!) 90/54 (!) 89/53  Pulse: 69  72 63  Resp: (!) 26  (!) 24 (!) 24  Temp:      TempSrc:      SpO2: 97% 98% 98% 98%  Weight:      Height:       Physical Exam  Constitutional: No distress.  Eyes: No scleral icterus.  Cardiovascular: Normal rate.  Pulmonary/Chest:  Breathing via nasal cannula oxygen.  Abdominal: Soft.    Musculoskeletal:     Cervical back: Neck supple.  Neurological:  Lethargic, arousable with verbal stimuli.  Answered a few simple  questions.  Skin: Skin is warm.       CMP Latest Ref Rng & Units 03/06/2019  Glucose 70 - 99 mg/dL 182(H)  BUN 8 - 23 mg/dL 75(H)  Creatinine 0.61 - 1.24 mg/dL 4.22(H)  Sodium 135 - 145 mmol/L 129(L)  Potassium 3.5 - 5.1 mmol/L 3.3(L)  Chloride 98 - 111 mmol/L 102  CO2 22 - 32 mmol/L 14(L)  Calcium 8.9 - 10.3 mg/dL 8.2(L)  Total Protein 6.5 - 8.1 g/dL -  Total Bilirubin 0.3 - 1.2 mg/dL -  Alkaline Phos 38 - 126 U/L -  AST 15 - 41 U/L -  ALT 0 - 44 U/L -   CBC Latest Ref Rng & Units 03/06/2019  WBC 4.0 - 10.5 K/uL 7.8  Hemoglobin 13.0 - 17.0 g/dL 11.0(L)  Hematocrit 39.0 - 52.0 % 31.0(L)  Platelets 150 - 400 K/uL 204    RADIOGRAPHIC STUDIES: I have personally reviewed the radiological images as listed and agreed with the findings in the report. CT HEAD WO CONTRAST  Result Date: 03/05/2019 CLINICAL DATA:  Metastatic disease evaluation.  Lung cancer. EXAM: CT HEAD WITHOUT CONTRAST TECHNIQUE: Contiguous axial images were obtained from the base of the skull through the vertex without intravenous contrast. COMPARISON:  MRI head 09/20/2018 FINDINGS: Brain: Mild atrophy. Mild hypodensity in the white matter unchanged from the prior MRI. No acute infarct, hemorrhage, mass. No edema or midline shift. Vascular: Negative for hyperdense vessel Skull: Negative skull. Sinuses/Orbits: Mucosal edema and bony thickening right maxillary sinus. Chronic nasal bone fracture. Left cataract extraction. Other: None IMPRESSION: No acute abnormality and negative for metastatic disease on unenhanced CT. Atrophy and mild chronic microvascular ischemia. Electronically Signed   By: Franchot Gallo M.D.   On: 03/05/2019 15:28   US RENAL  Result Date: 03/04/2019 CLINICAL DATA:  Acute kidney injury EXAM: RENAL / URINARY TRACT ULTRASOUND COMPLETE COMPARISON:  CT 01/22/2019 FINDINGS: Right  Kidney: Renal measurements: 11.4 x 6 x 6 cm = volume: 215 mL . Echogenicity within normal limits. No mass or hydronephrosis visualized. Left Kidney: Renal measurements: 11.8 x 7.3 x 6.5 cm = volume: 291.8 mL. Echogenicity within normal limits. No mass or hydronephrosis visualized. Bladder: Appears normal for degree of bladder distention. Other: Incidental note made of gallstones and small effusions. Heterogenous prostate. IMPRESSION: Normal ultrasound appearance of the kidneys Electronically Signed   By: Donavan Foil M.D.   On: 03/04/2019 15:37   DG Chest Portable 1 View  Result Date: 03/03/2019 CLINICAL DATA:  Shortness of breath, since Monday worsening this morning around 1 a.m. EXAM: PORTABLE CHEST 1 VIEW COMPARISON:  12/19/2018 FINDINGS: Cardiomediastinal contours are stable following median sternotomy with persistent left-sided pleural effusion and pleural-parenchymal scarring. No new areas of consolidation or evidence of right-sided pleural effusion. No signs of acute bone process. IMPRESSION: Persistent left-sided pleural effusion and pleural-parenchymal scarring. Electronically Signed   By: Zetta Bills M.D.   On: 03/03/2019 13:59   ECHOCARDIOGRAM COMPLETE  Result Date: 03/04/2019    ECHOCARDIOGRAM REPORT   Patient Name:   JAARON OLESON Date of Exam: 03/04/2019 Medical Rec #:  [id number]       Height:       72.0 in Accession #:    [id number]      Weight:       185.0 lb Date of Birth:  [birth year]       BSA:          2.061 m Patient Age:    40 years  BP:           125/72 mmHg Patient Gender: M               HR:           119 bpm. Exam Location:  [facility] Procedure: 2D Echo, Color Doppler and Cardiac Doppler Indications:     I21.4 NSTEMI  History:         Patient has prior history of Echocardiogram examinations, most                  recent 08/24/2018. CAD, Prior CABG; Risk Factors:Hypertension,                  Dyslipidemia and Diabetes. Lung Cancer.  Sonographer:     Charmayne Sheer RDCS (AE) Referring  Phys:  [place] Phys: Kathlyn Sacramento MD IMPRESSIONS  1. Left ventricular ejection fraction, by estimation, is 30 to 35%. The left ventricle has moderately decreased function. The left ventricle demonstrates global hypokinesis. Left ventricular diastolic parameters are indeterminate.  2. Right ventricular systolic function is normal. The right ventricular size is normal. Tricuspid regurgitation signal is inadequate for assessing PA pressure.  3. Left atrial size was mild to moderately dilated.  4. The mitral valve is normal in structure and function. Moderate mitral valve regurgitation. No evidence of mitral stenosis.  5. The aortic valve is normal in structure and function. Aortic valve regurgitation is trivial. Mild to moderate aortic valve sclerosis/calcification is present, without any evidence of aortic stenosis.  6. The inferior vena cava is normal in size with greater than 50% respiratory variability, suggesting right atrial pressure of 3 mmHg. FINDINGS  Left Ventricle: Left ventricular ejection fraction, by estimation, is 30 to 35%. The left ventricle has moderately decreased function. The left ventricle demonstrates global hypokinesis. The left ventricular internal cavity size was normal in size. There is no left ventricular hypertrophy. Left ventricular diastolic parameters are indeterminate. Right Ventricle: The right ventricular size is normal. No increase in right ventricular wall thickness. Right ventricular systolic function is normal. Tricuspid regurgitation signal is inadequate for assessing PA pressure. Left Atrium: Left atrial size was mild to moderately dilated. Right Atrium: Right atrial size was normal in size. Pericardium: There is no evidence of pericardial effusion. Mitral Valve: The mitral valve is normal in structure and function. Normal mobility of the mitral valve leaflets. Moderate mitral valve regurgitation. No evidence of mitral valve stenosis. MV peak gradient,  7.5 mmHg. The mean mitral valve gradient is 4.0  mmHg. Tricuspid Valve: The tricuspid valve is normal in structure. Tricuspid valve regurgitation is not demonstrated. No evidence of tricuspid stenosis. Aortic Valve: The aortic valve is normal in structure and function. Aortic valve regurgitation is trivial. Aortic regurgitation PHT measures 281 msec. Mild to moderate aortic valve sclerosis/calcification is present, without any evidence of aortic stenosis. Aortic valve mean gradient measures 5.0 mmHg. Aortic valve peak gradient measures 8.5 mmHg. Aortic valve area, by VTI measures 2.53 cm. Pulmonic Valve: The pulmonic valve was normal in structure. Pulmonic valve regurgitation is not visualized. No evidence of pulmonic stenosis. Aorta: The aortic root is normal in size and structure. Venous: The inferior vena cava is normal in size with greater than 50% respiratory variability, suggesting right atrial pressure of 3 mmHg. IAS/Shunts: No atrial level shunt detected by color flow Doppler.  LEFT VENTRICLE PLAX 2D LVIDd:         5.21 cm  Diastology LVIDs:  4.21 cm  LV e' lateral:   7.83 cm/s LV PW:         0.84 cm  LV E/e' lateral: 13.4 LV IVS:        0.89 cm  LV e' medial:    7.94 cm/s LVOT diam:     2.40 cm  LV E/e' medial:  13.2 LV SV:         54 LV SV Index:   26 LVOT Area:     4.52 cm  RIGHT VENTRICLE RV Basal diam:  3.08 cm LEFT ATRIUM             Index       RIGHT ATRIUM           Index LA diam:        4.70 cm 2.28 cm/m  RA Area:     14.10 cm LA Vol (A2C):   62.0 ml 30.08 ml/m RA Volume:   35.20 ml  17.08 ml/m LA Vol (A4C):   68.6 ml 33.29 ml/m LA Biplane Vol: 65.3 ml 31.68 ml/m  AORTIC VALVE                    PULMONIC VALVE AV Area (Vmax):    2.95 cm     PV Vmax:       1.10 m/s AV Area (Vmean):   2.86 cm     PV Vmean:      68.400 cm/s AV Area (VTI):     2.53 cm     PV VTI:        0.146 m AV Vmax:           146.00 cm/s  PV Peak grad:  4.8 mmHg AV Vmean:          103.000 cm/s PV Mean grad:  2.0  mmHg AV VTI:            0.213 m AV Peak Grad:      8.5 mmHg AV Mean Grad:      5.0 mmHg LVOT Vmax:         95.30 cm/s LVOT Vmean:        65.200 cm/s LVOT VTI:          0.119 m LVOT/AV VTI ratio: 0.56 AI PHT:            281 msec  AORTA Ao Root diam: 3.00 cm MITRAL VALVE MV Area (PHT): 3.92 cm     SHUNTS MV Peak grad:  7.5 mmHg     Systemic VTI:  0.12 m MV Mean grad:  4.0 mmHg     Systemic Diam: 2.40 cm MV Vmax:       1.37 m/s MV Vmean:      92.7 cm/s MV Decel Time: 194 msec MV E velocity: 104.80 cm/s Kathlyn Sacramento MD Electronically signed by Kathlyn Sacramento MD Signature Date/Time: 03/04/2019/2:46:43 PM    Final     Assessment and plan-  Patient is a 64 y.o. male with history of stage IV lung cancer, BRAF mutation on targeted therapy, hypertension, hyperlipidemia, CAD with history of CABG, diabetes, anxiety presented to emergency room due to breathing difficulties.he was found have DKA, AKI, mental status change.  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  He has had excellent response to targeted therapy which was documented on CT scan on 01/22/2019.  #Altered mental status,  CT head images were independently viewed by  me.  No acute abnormality, negative for metastatic disease.  Mental status change likely secondary to metabolic encephalopathy.  Slightly improved today.  #Acute renal failure, ATN, creatinine further increases.  Some urine output. Nephrology Dr. Keturah Barre recommendation was reviewed.   .    Elevated procalcitonin level.  He is on empiric antibiotics. #Acute NSTEMI /atrial fibrillation with RVR, cardiology on board.  Acute CHF, ejection fraction decreased to 30%.  He is currently on heparin infusion.  A. fib, rate controlled.  On amiodarone.  Managed by cardiology. Patient has history of CAD/CABG.  Dabrafenib may have further contributed to his CHF. Hold dabrafenib and trametinib.  CODE STATUS,  DNR/DNI. Thank you for allowing me to participate in the care of this patient.   Earlie Server, MD, PhD Hematology Oncology [facility] Pager- [phone] 03/06/2019

## 2019-03-06 NOTE — Progress Notes (Signed)
PROGRESS NOTE                                                                                                                                                                                                             Patient Demographics:    Craig Koch, is a 64 y.o. male, DOB - 1955/01/26, JSH:[id number]  Admit date - 03/03/2019   Admitting Physician Ivor Costa, MD  Outpatient Primary MD for the patient is Tonia Ghent, MD  LOS - 3  Outpatient Specialists: Oncology  Chief Complaint  Patient presents with  . Shortness of Breath       Brief Narrative 64 year old male with hypertension, hyperlipidemia, type 2 diabetes mellitus, anxiety depression, stage IV lung cancer, CAD with history of CABG, tobacco and alcohol use who presented with generalized weakness and shortness of breath.  In the ED he was found to be in DKA, AKI with creatinine of 1.6, new onset A. fib with RVR.  Required BiPAP and admission to stepdown unit. Hospital course prolonged due to persistent encephalopathy, rapid A. fib,?  Occult bacteremia and ATN.   Subjective:   Patient more awake today but falls back to sleep.  Aware that he is in the hospital and came due to feeling short of breath.   Assessment  & Plan :    Principal Problem: New onset atrial fibrillation with RVR (HCC) Rate better controlled today.  Continue IV heparin and amiodarone.  Continue home dose metoprolol.  Cardiology following.  Active problems Acute tubular necrosis Suspect prerenal with hypovolemia from ketoacidosis..  Worsening renal function in a.m. lab (creatinine of 4.25) but having good urine output of 900 cc.  Strict I/O, gentle hydration and avoid nephrotoxins.  Renal following.  Renal ultrasound unremarkable.    Acute metabolic encephalopathy Started on empiric antibiotic (vancomycin and Zosyn).  With concern for occult bacteremia and elevated procalcitonin.  Is  more awake and communicative today.  Will monitor for now.  Follow blood culture results.  Acute systolic CHF (EF 85-02%) NSTEMI Presented with markedly elevated troponin and suspect NSTEMI with respiratory failure and DKA. Continue IV heparin, beta-blocker and statin.  Further ischemic evaluation upon resolution of acute issues.  Uncontrolled type II diabetes mellitus with DKA DKA resolved on insulin drip.  CBG in 200s.  Currently on 35 units of Lantus will increase to  40 units daily.  Continue sliding scale coverage.  Alcohol abuse Monitor for withdrawal symptoms.  Encephalopathy could be contributed by withdrawal symptoms.   Tobacco abuse Nicotine patch.   Stage IV lung adenocarcinoma Follows with Dr. Tasia Catchings.  On dabrafenib.  Oncology following.  Hypothermia Secondary to DKA.  Resolved    Code Status : DNR (being followed by palliative care)  Family Communication  : We will update wife.  Disposition Plan  : Continue stepdown monitoring given multiple active issues including rapid A. fib, metabolic encephalopathy, NSTEMI,?  Bacteremia.  Barriers For Discharge : Ongoing multiple medical issues  Consults  : Cardiology, nephrology, critical care, oncology  Procedures  : 2D echo, head CT  DVT Prophylaxis  : IV heparin  Lab Results  Component Value Date   PLT 204 03/06/2019    Antibiotics  :    Anti-infectives (From admission, onward)   Start     Dose/Rate Route Frequency Ordered Stop   03/06/19 2200  piperacillin-tazobactam (ZOSYN) IVPB 3.375 g     3.375 g 12.5 mL/hr over 240 Minutes Intravenous Every 12 hours 03/06/19 0858     03/05/19 1420  vancomycin variable dose per unstable renal function (pharmacist dosing)      Does not apply See admin instructions 03/05/19 1420     03/05/19 1200  vancomycin (VANCOREADY) IVPB 1500 mg/300 mL     1,500 mg 150 mL/hr over 120 Minutes Intravenous  Once 03/05/19 0944 03/05/19 1437   03/05/19 1100  piperacillin-tazobactam (ZOSYN)  IVPB 3.375 g  Status:  Discontinued     3.375 g 12.5 mL/hr over 240 Minutes Intravenous Every 8 hours 03/05/19 0944 03/06/19 0858        Objective:   Vitals:   03/06/19 0800 03/06/19 0900 03/06/19 1000 03/06/19 1100  BP: 114/72 115/61 111/63 100/71  Pulse: 92 91 92 87  Resp: (!) 28 (!) 23 (!) 26 (!) 26  Temp: 98.2 F (36.8 C)     TempSrc: Oral     SpO2: 98% 96% 95% 95%  Weight:      Height:        Wt Readings from Last 3 Encounters:  03/03/19 83.9 kg  02/14/19 86.6 kg  02/08/19 86.9 kg     Intake/Output Summary (Last 24 hours) at 03/06/2019 1142 Last data filed at 03/06/2019 1016 Gross per 24 hour  Intake 2340.2 ml  Output 670 ml  Net 1670.2 ml     Physical Exam  Gen: Sleepy but arousable and answering few questions, fatigued HEENT: no pallor, moist mucosa, supple neck Chest: Diminished bibasilar breath sounds CVS: S1-S2 regular, no murmurs rubs or gallop GI: soft, NT, ND, BS+ Musculoskeletal: warm, no edema CNS: Sleepy but arousable, AAO x2    Data Review:    CBC Recent Labs  Lab 03/03/19 1330 03/03/19 2359 03/04/19 1059 03/05/19 0153 03/06/19 0152  WBC 9.4 9.9 8.5 8.5 7.8  HGB 13.4 12.2* 12.2* 11.1* 11.0*  HCT 42.9 36.2* 34.2* 31.1* 31.0*  PLT 232 192 210 189 204  MCV 87.4 82.1 77.0* 77.8* 77.1*  MCH 27.3 27.7 27.5 27.8 27.4  MCHC 31.2 33.7 35.7 35.7 35.5  RDW 16.8* 16.7* 16.4* 17.1* 17.4*  LYMPHSABS 0.8  --   --   --   --   MONOABS 0.7  --   --   --   --   EOSABS 0.0  --   --   --   --   BASOSABS 0.1  --   --   --   --  Chemistries  Recent Labs  Lab 03/03/19 1547 03/03/19 1547 03/03/19 1750 03/03/19 1936 03/03/19 2359 03/04/19 0526 03/04/19 1059 03/05/19 0153 03/06/19 0152  NA 123*   < > 123*   < > 127* 127* 127* 128* 129*  K 5.1   < > 5.4*   < > 3.5 3.5 3.7 3.5 3.3*  CL 89*   < > 89*   < > 96* 99 102 102 102  CO2 <7*   < > 8*   < > 13* 14* 14* 15* 14*  GLUCOSE 718*   < > 756*   < > 440* 177* 134* 311* 182*  BUN 37*   < > 43*    < > 48* 53* 53* 65* 75*  CREATININE 1.60*   < > 1.79*   < > 1.98* 1.88* 2.13* 3.16* 4.22*  CALCIUM 8.2*   < > 8.2*   < > 8.0* 8.1* 8.0* 8.0* 8.2*  MG  --   --  2.3  --   --   --   --  1.9 1.9  AST 81*  --  136*  --   --   --   --   --   --   ALT 18  --  25  --   --   --   --   --   --   ALKPHOS 80  --  104  --   --   --   --   --   --   BILITOT 2.3*  --  2.4*  --   --   --   --   --   --    < > = values in this interval not displayed.   ------------------------------------------------------------------------------------------------------------------ Recent Labs    03/04/19 1059  CHOL 138  HDL 15*  LDLCALC 81  TRIG 209*  CHOLHDL 9.2    Lab Results  Component Value Date   HGBA1C 11.3 (H) 03/04/2019   ------------------------------------------------------------------------------------------------------------------ Recent Labs    03/04/19 0526  TSH 3.184   ------------------------------------------------------------------------------------------------------------------ No results for input(s): VITAMINB12, FOLATE, FERRITIN, TIBC, IRON, RETICCTPCT in the last 72 hours.  Coagulation profile Recent Labs  Lab 03/03/19 1750  INR 1.2    No results for input(s): DDIMER in the last 72 hours.  Cardiac Enzymes No results for input(s): CKMB, TROPONINI, MYOGLOBIN in the last 168 hours.  Invalid input(s): CK ------------------------------------------------------------------------------------------------------------------    Component Value Date/Time   BNP 330.0 (H) 03/03/2019 1330    Inpatient Medications  Scheduled Meds: . aspirin EC  325 mg Oral Daily  . chlorhexidine  15 mL Mouth Rinse BID  . Chlorhexidine Gluconate Cloth  6 each Topical Daily  . dabrafenib mesylate  150 mg Oral BID  . dextromethorphan-guaiFENesin  1 tablet Oral BID  . dronabinol  5 mg Oral BID AC  . DULoxetine  30 mg Oral Daily  . insulin aspart  0-15 Units Subcutaneous Q4H  . insulin glargine  35  Units Subcutaneous Q24H  . ipratropium-albuterol  3 mL Nebulization Q6H  . LORazepam  0-4 mg Intravenous Q12H  . mouth rinse  15 mL Mouth Rinse q12n4p  . metoprolol tartrate  25 mg Oral BID  . multivitamin with minerals  1 tablet Oral Daily  . nicotine  21 mg Transdermal Daily  . rosuvastatin  10 mg Oral Daily  . trametinib dimethyl sulfoxide  2 mg Oral Daily  . vancomycin variable dose per unstable renal function (pharmacist dosing)   Does not apply See  admin instructions   Continuous Infusions: . amiodarone 30 mg/hr (03/06/19 0729)  . dextrose 5% lactated ringers 75 mL/hr at 03/06/19 0729  . diltiazem (CARDIZEM) infusion Stopped (03/03/19 2026)  . diltiazem (CARDIZEM) infusion 10 mg/hr (03/03/19 1815)  . heparin 1,950 Units/hr (03/06/19 0739)  . piperacillin-tazobactam (ZOSYN)  IV    . small volume/piggyback builder 50 mL/hr at 03/05/19 1113   PRN Meds:.acetaminophen, albuterol, dextrose, LORazepam **OR** LORazepam, ondansetron (ZOFRAN) IV, pneumococcal 23 valent vaccine  Micro Results Recent Results (from the past 240 hour(s))  Respiratory Panel by RT PCR (Flu A&B, Covid) - Nasopharyngeal Swab     Status: None   Collection Time: 03/03/19  3:31 PM   Specimen: Nasopharyngeal Swab  Result Value Ref Range Status   SARS Coronavirus 2 by RT PCR NEGATIVE NEGATIVE Final    Comment: (NOTE) SARS-CoV-2 target nucleic acids are NOT DETECTED. The SARS-CoV-2 RNA is generally detectable in upper respiratoy specimens during the acute phase of infection. The lowest concentration of SARS-CoV-2 viral copies this assay can detect is 131 copies/mL. A negative result does not preclude SARS-Cov-2 infection and should not be used as the sole basis for treatment or other patient management decisions. A negative result may occur with  improper specimen collection/handling, submission of specimen other than nasopharyngeal swab, presence of viral mutation(s) within the areas targeted by this assay, and  inadequate number of viral copies (<131 copies/mL). A negative result must be combined with clinical observations, patient history, and epidemiological information. The expected result is Negative. Fact Sheet for Patients:  PinkCheek.be Fact Sheet for Healthcare Providers:  GravelBags.it This test is not yet ap proved or cleared by the [place] FDA and  has been authorized for detection and/or diagnosis of SARS-CoV-2 by FDA under an Emergency Use Authorization (EUA). This EUA will remain  in effect (meaning this test can be used) for the duration of the COVID-19 declaration under Section 564(b)(1) of the Act, 21 U.S.C. section 360bbb-3(b)(1), unless the authorization is terminated or revoked sooner.    Influenza A by PCR NEGATIVE NEGATIVE Final   Influenza B by PCR NEGATIVE NEGATIVE Final    Comment: (NOTE) The Xpert Xpress SARS-CoV-2/FLU/RSV assay is intended as an aid in  the diagnosis of influenza from Nasopharyngeal swab specimens and  should not be used as a sole basis for treatment. Nasal washings and  aspirates are unacceptable for Xpert Xpress SARS-CoV-2/FLU/RSV  testing. Fact Sheet for Patients: PinkCheek.be Fact Sheet for Healthcare Providers: GravelBags.it This test is not yet approved or cleared by the [place] FDA and  has been authorized for detection and/or diagnosis of SARS-CoV-2 by  FDA under an Emergency Use Authorization (EUA). This EUA will remain  in effect (meaning this test can be used) for the duration of the  Covid-19 declaration under Section 564(b)(1) of the Act, 21  U.S.C. section 360bbb-3(b)(1), unless the authorization is  terminated or revoked. Performed at [facility], [address] [place], [place] [address]   MRSA PCR Screening     Status: None   Collection Time: 03/03/19  7:48 PM   Specimen: Nasopharyngeal    Result Value Ref Range Status   MRSA by PCR NEGATIVE NEGATIVE Final    Comment:        The GeneXpert MRSA Assay (FDA approved for NASAL specimens only), is one component of a comprehensive MRSA colonization surveillance program. It is not intended to diagnose MRSA infection nor to guide or monitor treatment for MRSA infections. Performed at [facility], [address], [place], [place] [address]   CULTURE, BLOOD (ROUTINE X 2) w Reflex to ID Panel     Status: None (Preliminary result)   Collection Time: 03/05/19 10:16 AM   Specimen: BLOOD  Result Value Ref Range Status   Specimen Description BLOOD LEFT ANTECUBITAL  Final   Special Requests   Final    BOTTLES DRAWN AEROBIC AND ANAEROBIC Blood Culture adequate volume   Culture   Final    NO GROWTH < 24 HOURS Performed at [facility], [address], [place], [place] [address]    Report Status PENDING  Incomplete  CULTURE, BLOOD (ROUTINE X 2) w Reflex to ID Panel     Status: None (Preliminary result)   Collection Time: 03/05/19 10:22 AM   Specimen: BLOOD  Result Value Ref Range Status   Specimen Description BLOOD BLOOD LEFT HAND  Final   Special Requests   Final    BOTTLES DRAWN AEROBIC ONLY Blood Culture adequate volume   Culture   Final    NO GROWTH < 24 HOURS Performed at [facility], [address], [place], [place] [address]    Report Status PENDING  Incomplete    Radiology Reports CT HEAD WO CONTRAST  Result Date: 03/05/2019 CLINICAL DATA:  Metastatic disease evaluation.  Lung cancer. EXAM: CT HEAD WITHOUT CONTRAST TECHNIQUE: Contiguous axial images were obtained from the base of the skull through the vertex without intravenous contrast. COMPARISON:  MRI head 09/20/2018 FINDINGS: Brain: Mild atrophy. Mild hypodensity in the white matter unchanged from the prior MRI. No acute infarct, hemorrhage, mass. No edema or midline shift. Vascular: Negative for hyperdense vessel Skull: Negative  skull. Sinuses/Orbits: Mucosal edema and bony thickening right maxillary sinus. Chronic nasal bone fracture. Left cataract extraction. Other: None IMPRESSION: No acute abnormality and negative for metastatic disease on unenhanced CT. Atrophy and mild chronic microvascular ischemia. Electronically Signed   By: Franchot Gallo M.D.   On: 03/05/2019 15:28   US RENAL  Result Date: 03/04/2019 CLINICAL DATA:  Acute kidney injury EXAM: RENAL / URINARY TRACT ULTRASOUND COMPLETE COMPARISON:  CT 01/22/2019 FINDINGS: Right Kidney: Renal measurements: 11.4 x 6 x 6 cm = volume: 215 mL . Echogenicity within normal limits. No mass or hydronephrosis visualized. Left Kidney: Renal measurements: 11.8 x 7.3 x 6.5 cm = volume: 291.8 mL. Echogenicity within normal limits. No mass or hydronephrosis visualized. Bladder: Appears normal for degree of bladder distention. Other: Incidental note made of gallstones and small effusions. Heterogenous prostate. IMPRESSION: Normal ultrasound appearance of the kidneys Electronically Signed   By: Donavan Foil M.D.   On: 03/04/2019 15:37   DG Chest Portable 1 View  Result Date: 03/03/2019 CLINICAL DATA:  Shortness of breath, since Monday worsening this morning around 1 a.m. EXAM: PORTABLE CHEST 1 VIEW COMPARISON:  12/19/2018 FINDINGS: Cardiomediastinal contours are stable following median sternotomy with persistent left-sided pleural effusion and pleural-parenchymal scarring. No new areas of consolidation or evidence of right-sided pleural effusion. No signs of acute bone process. IMPRESSION: Persistent left-sided pleural effusion and pleural-parenchymal scarring. Electronically Signed   By: Zetta Bills M.D.   On: 03/03/2019 13:59   ECHOCARDIOGRAM COMPLETE  Result Date: 03/04/2019    ECHOCARDIOGRAM REPORT   Patient Name:   JAECEON MICHELIN Date of Exam: 03/04/2019 Medical Rec #:  [id number]       Height:       72.0 in Accession #:    [id number]      Weight:  185.0 lb Date of Birth:   11/15/1955       BSA:          2.061 m Patient Age:    76 years        BP:           125/72 mmHg Patient Gender: M               HR:           119 bpm. Exam Location:  [facility] Procedure: 2D Echo, Color Doppler and Cardiac Doppler Indications:     I21.4 NSTEMI  History:         Patient has prior history of Echocardiogram examinations, most                  recent 08/24/2018. CAD, Prior CABG; Risk Factors:Hypertension,                  Dyslipidemia and Diabetes. Lung Cancer.  Sonographer:     Charmayne Sheer RDCS (AE) Referring Phys:  [place]  Phys: Kathlyn Sacramento MD IMPRESSIONS  1. Left ventricular ejection fraction, by estimation, is 30 to 35%. The left ventricle has moderately decreased function. The left ventricle demonstrates global hypokinesis. Left ventricular diastolic parameters are indeterminate.  2. Right ventricular systolic function is normal. The right ventricular size is normal. Tricuspid regurgitation signal is inadequate for assessing PA pressure.  3. Left atrial size was mild to moderately dilated.  4. The mitral valve is normal in structure and function. Moderate mitral valve regurgitation. No evidence of mitral stenosis.  5. The aortic valve is normal in structure and function. Aortic valve regurgitation is trivial. Mild to moderate aortic valve sclerosis/calcification is present, without any evidence of aortic stenosis.  6. The inferior vena cava is normal in size with greater than 50% respiratory variability, suggesting right atrial pressure of 3 mmHg. FINDINGS  Left Ventricle: Left ventricular ejection fraction, by estimation, is 30 to 35%. The left ventricle has moderately decreased function. The left ventricle demonstrates global hypokinesis. The left ventricular internal cavity size was normal in size. There is no left ventricular hypertrophy. Left ventricular diastolic parameters are indeterminate. Right Ventricle: The right ventricular size is normal. No increase in right  ventricular wall thickness. Right ventricular systolic function is normal. Tricuspid regurgitation signal is inadequate for assessing PA pressure. Left Atrium: Left atrial size was mild to moderately dilated. Right Atrium: Right atrial size was normal in size. Pericardium: There is no evidence of pericardial effusion. Mitral Valve: The mitral valve is normal in structure and function. Normal mobility of the mitral valve leaflets. Moderate mitral valve regurgitation. No evidence of mitral valve stenosis. MV peak gradient, 7.5 mmHg. The mean mitral valve gradient is 4.0  mmHg. Tricuspid Valve: The tricuspid valve is normal in structure. Tricuspid valve regurgitation is not demonstrated. No evidence of tricuspid stenosis. Aortic Valve: The aortic valve is normal in structure and function. Aortic valve regurgitation is trivial. Aortic regurgitation PHT measures 281 msec. Mild to moderate aortic valve sclerosis/calcification is present, without any evidence of aortic stenosis. Aortic valve mean gradient measures 5.0 mmHg. Aortic valve peak gradient measures 8.5 mmHg. Aortic valve area, by VTI measures 2.53 cm. Pulmonic Valve: The pulmonic valve was normal in structure. Pulmonic valve regurgitation is not visualized. No evidence of pulmonic stenosis. Aorta: The aortic root is normal in size and structure. Venous: The inferior vena cava is normal in size with greater than 50% respiratory variability, suggesting right  atrial pressure of 3 mmHg. IAS/Shunts: No atrial level shunt detected by color flow Doppler.  LEFT VENTRICLE PLAX 2D LVIDd:         5.21 cm  Diastology LVIDs:         4.21 cm  LV e' lateral:   7.83 cm/s LV PW:         0.84 cm  LV E/e' lateral: 13.4 LV IVS:        0.89 cm  LV e' medial:    7.94 cm/s LVOT diam:     2.40 cm  LV E/e' medial:  13.2 LV SV:         54 LV SV Index:   26 LVOT Area:     4.52 cm  RIGHT VENTRICLE RV Basal diam:  3.08 cm LEFT ATRIUM             Index       RIGHT ATRIUM           Index LA  diam:        4.70 cm 2.28 cm/m  RA Area:     14.10 cm LA Vol (A2C):   62.0 ml 30.08 ml/m RA Volume:   35.20 ml  17.08 ml/m LA Vol (A4C):   68.6 ml 33.29 ml/m LA Biplane Vol: 65.3 ml 31.68 ml/m  AORTIC VALVE                    PULMONIC VALVE AV Area (Vmax):    2.95 cm     PV Vmax:       1.10 m/s AV Area (Vmean):   2.86 cm     PV Vmean:      68.400 cm/s AV Area (VTI):     2.53 cm     PV VTI:        0.146 m AV Vmax:           146.00 cm/s  PV Peak grad:  4.8 mmHg AV Vmean:          103.000 cm/s PV Mean grad:  2.0 mmHg AV VTI:            0.213 m AV Peak Grad:      8.5 mmHg AV Mean Grad:      5.0 mmHg LVOT Vmax:         95.30 cm/s LVOT Vmean:        65.200 cm/s LVOT VTI:          0.119 m LVOT/AV VTI ratio: 0.56 AI PHT:            281 msec  AORTA Ao Root diam: 3.00 cm MITRAL VALVE MV Area (PHT): 3.92 cm     SHUNTS MV Peak grad:  7.5 mmHg     Systemic VTI:  0.12 m MV Mean grad:  4.0 mmHg     Systemic Diam: 2.40 cm MV Vmax:       1.37 m/s MV Vmean:      92.7 cm/s MV Decel Time: 194 msec MV E velocity: 104.80 cm/s Kathlyn Sacramento MD Electronically signed by Kathlyn Sacramento MD Signature Date/Time: 03/04/2019/2:46:43 PM    Final     Time Spent in minutes 35   Loretha Ure M.D on 03/06/2019 at 11:42 AM  Between 7am to 7pm - Pager - [phone]  After 7pm go to [email or web address] - password [facility]  [facility] -  Office  [phone]

## 2019-03-06 NOTE — Consult Note (Addendum)
ANTICOAGULATION CONSULT NOTE  Pharmacy Consult for Heparin  Indication: atrial fibrillation- new onset  Patient Measurements: Height: 6' (182.9 cm) Weight: 184 lb 15.5 oz (83.9 kg) IBW/kg (Calculated) : 77.6 Heparin Dosing Weight: 86.2 kg   Vital Signs: Temp: 100.2 F (37.9 C) (03/03 0000) Temp Source: Axillary (03/03 0000) BP: 118/70 (03/03 0000) Pulse Rate: 113 (03/03 0000)  Labs: Recent Labs    03/03/19 1547 03/03/19 1547 03/03/19 1750 03/03/19 1936 03/04/19 1059 03/04/19 1849 03/05/19 0153 03/05/19 0153 03/05/19 0758 03/05/19 1743 03/06/19 0152  HGB  --   --   --    < > 12.2*  --  11.1*  --   --   --  11.0*  HCT  --   --   --    < > 34.2*  --  31.1*  --   --   --  31.0*  PLT  --   --   --    < > 210  --  189  --   --   --  204  APTT  --   --  85*  --   --   --   --   --   --   --   --   LABPROT  --   --  14.6  --   --   --   --   --   --   --   --   INR  --   --  1.2  --   --   --   --   --   --   --   --   HEPARINUNFRC  --   --   --    < > 0.24*   < > 0.33   < > 0.28* 0.30 0.23*  CREATININE 1.60*   < > 1.79*   < > 2.13*  --  3.16*  --   --   --  4.22*  TROPONINIHS 13,356*  --  >27,000*  --   --   --   --   --   --   --   --    < > = values in this interval not displayed.    Estimated Creatinine Clearance: 19.7 mL/min (A) (by C-G formula based on SCr of 4.22 mg/dL (H)).   Medications:  Called RN to confirm whether pt has been on [facility] prior to admission. Nurse was not able to confirm as she was not in the patient's room. Per chart review, no AC prior to admission and confirmed with pharmacy technician who spoke to patient.   Heparin Course: 02/28 initiation: 1150 units/hr 02/28 2350 HL 0.26: inc to 1300 units/hr 03/01 0530 HL 0.31: no change 03/01 1059 HL 0.24: inc to 1450 units/hr 03/01 1849 HL 0.28: inc to 1550 units/hr 03/02 0153 HL 0.33: no change 03/02 0758 HL 0.28: inc to 1700 units/hr 03/02 1743 HL 0.30- therapeutic  03/03 0152 HL 0.23,  subtherapeutic, CBC stable  Assessment: Craig Koch is a 64 y.o. male with a past medical history of anxiety, CAD, diabetes, and hyperlipidemia, hypertension, lung cancer, presents to the emergency department for shortness of breath. Pharmacy was consulted for heparin dosing in a patient with new onset atrial fibrillation. H&H trending down slightly, platelets are stable  Goal of Therapy:  Heparin level 0.3-0.7 units/ml Monitor platelets by anticoagulation protocol: Yes   Plan:   Heparin Level 0.23 SUBtherapeutic, trended down  Will increase infusion to 1950 units/hr  Re-check heparin  level in 6 hours after rate increase.   Ena Dawley, PharmD Clinical Pharmacist 03/06/2019 2:38 AM

## 2019-03-07 DIAGNOSIS — J9601 Acute respiratory failure with hypoxia: Secondary | ICD-10-CM

## 2019-03-07 DIAGNOSIS — E43 Unspecified severe protein-calorie malnutrition: Secondary | ICD-10-CM

## 2019-03-07 DIAGNOSIS — E1011 Type 1 diabetes mellitus with ketoacidosis with coma: Secondary | ICD-10-CM

## 2019-03-07 DIAGNOSIS — J9602 Acute respiratory failure with hypercapnia: Secondary | ICD-10-CM

## 2019-03-07 LAB — GLUCOSE, CAPILLARY
Glucose-Capillary: 123 mg/dL — ABNORMAL HIGH (ref 70–99)
Glucose-Capillary: 102 mg/dL — ABNORMAL HIGH (ref 70–99)
Glucose-Capillary: 59 mg/dL — ABNORMAL LOW (ref 70–99)
Glucose-Capillary: 67 mg/dL — ABNORMAL LOW (ref 70–99)
Glucose-Capillary: 77 mg/dL (ref 70–99)
Glucose-Capillary: 60 mg/dL — ABNORMAL LOW (ref 70–99)

## 2019-03-07 LAB — HEPARIN LEVEL (UNFRACTIONATED)
Heparin Unfractionated: <0.1 IU/mL — ABNORMAL LOW (ref 0.30–0.70)
Heparin Unfractionated: 0.14 IU/mL — ABNORMAL LOW (ref 0.30–0.70)

## 2019-03-07 LAB — BASIC METABOLIC PANEL
Anion gap: 13 (ref 5–15)
BUN: 84 mg/dL — ABNORMAL HIGH (ref 8–23)
CO2: 16 mmol/L — ABNORMAL LOW (ref 22–32)
Calcium: 8.2 mg/dL — ABNORMAL LOW (ref 8.9–10.3)
Chloride: 101 mmol/L (ref 98–111)
Creatinine, Ser: 5.25 mg/dL — ABNORMAL HIGH (ref 0.61–1.24)
GFR calc Af Amer: 12 mL/min — ABNORMAL LOW (ref >60)
GFR calc non Af Amer: 11 mL/min — ABNORMAL LOW (ref >60)
Glucose, Bld: 144 mg/dL — ABNORMAL HIGH (ref 70–99)
Potassium: 3.3 mmol/L — ABNORMAL LOW (ref 3.5–5.1)
Sodium: 130 mmol/L — ABNORMAL LOW (ref 135–145)

## 2019-03-07 LAB — CBC
HCT: 28.3 % — ABNORMAL LOW (ref 39.0–52.0)
Hemoglobin: 10.2 g/dL — ABNORMAL LOW (ref 13.0–17.0)
MCH: 27.3 pg (ref 26.0–34.0)
MCHC: 36 g/dL (ref 30.0–36.0)
MCV: 75.7 fL — ABNORMAL LOW (ref 80.0–100.0)
Platelets: 244 10*3/uL (ref 150–400)
RBC: 3.74 MIL/uL — ABNORMAL LOW (ref 4.22–5.81)
RDW: 17.5 % — ABNORMAL HIGH (ref 11.5–15.5)
WBC: 6.3 10*3/uL (ref 4.0–10.5)
nRBC: 0 % (ref 0.0–0.2)

## 2019-03-07 LAB — VANCOMYCIN, RANDOM: Vancomycin Rm: 14

## 2019-03-07 MED ORDER — AMIODARONE HCL 200 MG PO TABS
400.0000 mg | ORAL_TABLET | Freq: Two times a day (BID) | ORAL | Status: DC
  Administered 2019-03-07 – 2019-03-13 (×13): 400 mg via ORAL
  Filled 2019-03-07 (×14): qty 2

## 2019-03-07 MED ORDER — METOPROLOL TARTRATE 25 MG PO TABS
12.5000 mg | ORAL_TABLET | Freq: Two times a day (BID) | ORAL | Status: DC
  Administered 2019-03-07 – 2019-03-15 (×13): 12.5 mg via ORAL
  Filled 2019-03-07 (×16): qty 1

## 2019-03-07 MED ORDER — HEPARIN BOLUS VIA INFUSION
2000.0000 [IU] | Freq: Once | INTRAVENOUS | Status: AC
  Administered 2019-03-07: 2000 [IU] via INTRAVENOUS
  Filled 2019-03-07: qty 2000

## 2019-03-07 MED ORDER — GLUCOSE 40 % PO GEL
1.0000 | ORAL | Status: AC
  Administered 2019-03-08: 37.5 g via ORAL
  Filled 2019-03-07: qty 1

## 2019-03-07 MED ORDER — SODIUM CHLORIDE 0.9 % IV SOLN: INTRAVENOUS | Status: DC

## 2019-03-07 MED ORDER — POTASSIUM CHLORIDE CRYS ER 20 MEQ PO TBCR
40.0000 meq | EXTENDED_RELEASE_TABLET | Freq: Once | ORAL | Status: AC
  Administered 2019-03-07: 40 meq via ORAL
  Filled 2019-03-07: qty 2

## 2019-03-07 MED ORDER — VANCOMYCIN HCL IN DEXTROSE 1-5 GM/200ML-% IV SOLN
1000.0000 mg | Freq: Once | INTRAVENOUS | Status: AC
  Administered 2019-03-07: 1000 mg via INTRAVENOUS
  Filled 2019-03-07: qty 200

## 2019-03-07 NOTE — Progress Notes (Signed)
[place]  [phone] [phone]   Name: IVA POSTEN Date: 03/07/2019 MRN: [id number]  DOB: 09-24-1955  Patient Care Team: Tonia Ghent, MD as PCP - General (Family Medicine) Thelma Comp, [place] (Optometry) Telford Nab, RN as Registered Nurse    REASON FOR CONSULTATION: Craig Koch is a 64 y.o. male with multiple medical problems including stage IV adenocarcinoma of the lung with history of left sided malignant pleural effusion requiring Pleurx (later removed). Patient has opted not to pursue chemotherapy and has instead been treated on immunotherapy. Patient has had persistent shortness of breath. CTA of the chest on 11/02/2018 revealed marked increase in tumor encasing the entire left lung with new diffuse increased interstitial thickening concerning for disease progression and lymphangitic spread of disease.   Repeat CT of the chest and abdomen on 01/22/2019 revealed significant interval decrease of tumor burden on the lung.  Patient was admitted to the hospital on 03/03/2019 with shortness of breath and weakness.  He was found to have non-STEMI and DKA.  Patient was referred to palliative care to help address goals and manage ongoing symptoms.   CODE STATUS: DNR  PAST MEDICAL HISTORY: Past Medical History:  Diagnosis Date  . Anxiety   . Arthritis   . Coronary artery disease   . Diabetes mellitus   . Dyslipidemia   . Hx of CABG   . Hyperlipidemia   . Hypertension   . Malignant neoplasm of unspecified part of unspecified bronchus or lung ([place]) 08/2018   Immunotherapy  . Pleural effusion     PAST SURGICAL HISTORY:  Past Surgical History:  Procedure Laterality Date  . CATARACT EXTRACTION Left 09/2015  . CHEST TUBE INSERTION Left 10/01/2018   Procedure: INSERTION PLEURAL DRAINAGE CATHETER;  Surgeon: [name], MD;  Location: [facility] ORS;  Service: Thoracic;  Laterality: Left;  . CORONARY  ARTERY BYPASS GRAFT  2004   (CABG with LIMA to the  LAD, SVG to OM2/OM3, SVG  to diag  . Left ankle surgery     repair of fracture  . Right lower leg surgery     rod    HEMATOLOGY/ONCOLOGY HISTORY:  Oncology History  lung adenocarcinoma  09/08/2018 Initial Diagnosis   lung adenocarcinoma   10/19/2018 -  Chemotherapy   The patient had pembrolizumab (KEYTRUDA) 200 mg in sodium chloride 0.9 % 50 mL chemo infusion, 200 mg, Intravenous, Once, 2 of 8 cycles Administration: 200 mg (10/19/2018), 200 mg (11/09/2018)  for chemotherapy treatment.      ALLERGIES:  is allergic to lipitor [atorvastatin calcium].  MEDICATIONS:  Current Facility-Administered Medications  Medication Dose Route Frequency Provider Last Rate Last Admin  . 0.9 %  sodium chloride infusion   Intravenous Continuous Tyler Pita, MD 75 mL/hr at 03/07/19 1343 New Bag at 03/07/19 1343  . acetaminophen (TYLENOL) tablet 650 mg  650 mg Oral Q6H PRN Ivor Costa, MD      . albuterol (PROVENTIL) (2.5 MG/3ML) 0.083% nebulizer solution 3 mL  3 mL Inhalation Q4H PRN Ivor Costa, MD      . amiodarone (PACERONE) tablet 400 mg  400 mg Oral BID Christell Faith M, PA-C   400 mg at 03/07/19 4742  . aspirin EC tablet 81 mg  81 mg Oral Daily Kate Sable, MD   81 mg at 03/07/19 0834  . chlorhexidine (PERIDEX) 0.12 % solution 15 mL  15 mL Mouth Rinse BID Bradly Bienenstock, NP   15 mL  at 03/07/19 0835  . Chlorhexidine Gluconate Cloth 2 % PADS 6 each  6 each Topical Daily Darel Hong D, NP      . dextromethorphan-guaiFENesin [facility] DM) 30-600 MG per 12 hr tablet 1 tablet  1 tablet Oral BID Ivor Costa, MD   1 tablet at 03/06/19 2149  . dextrose 50 % solution 0-50 mL  0-50 mL Intravenous PRN Ivor Costa, MD      . diltiazem (CARDIZEM) 125 mg in dextrose 5% 125 mL (1 mg/mL) infusion  5-15 mg/hr Intravenous Titrated Ivor Costa, MD 10 mL/hr at 03/03/19 1815 10 mg/hr at 03/03/19 1815  . dronabinol (MARINOL) capsule 5 mg  5 mg Oral BID AC Ivor Costa, MD   5 mg at 03/04/19 0840  . DULoxetine (CYMBALTA) DR capsule 30 mg  30 mg Oral Daily Ivor Costa, MD   30 mg at 03/07/19 0834  . heparin ADULT infusion 100 units/mL (25000 units/238mL sodium chloride 0.45%)  2,200 Units/hr Intravenous Continuous Dallie Piles, RPH 22 mL/hr at 03/07/19 1300 2,200 Units/hr at 03/07/19 1300  . insulin aspart (novoLOG) injection 0-15 Units  0-15 Units Subcutaneous Q4H Ralene Muskrat B, MD   2 Units at 03/07/19 [phone]  . insulin glargine (LANTUS) injection 40 Units  40 Units Subcutaneous Daily Dhungel, Nishant, MD   40 Units at 03/07/19 0946  . ipratropium-albuterol (DUONEB) 0.5-2.5 (3) MG/3ML nebulizer solution 3 mL  3 mL Nebulization TID Dhungel, Nishant, MD   3 mL at 03/07/19 1343  . LORazepam (ATIVAN) injection 0-4 mg  0-4 mg Intravenous Q12H Ivor Costa, MD   Stopped at 03/07/19 [phone]  . MEDLINE mouth rinse  15 mL Mouth Rinse q12n4p Darel Hong D, NP   15 mL at 03/06/19 1553  . metoprolol tartrate (LOPRESSOR) tablet 12.5 mg  12.5 mg Oral BID Christell Faith M, PA-C   12.5 mg at 03/07/19 2952  . multivitamin with minerals tablet 1 tablet  1 tablet Oral Daily Ivor Costa, MD   1 tablet at 03/07/19 0834  . nicotine (NICODERM CQ - dosed in mg/24 hours) patch 21 mg  21 mg Transdermal Daily Ivor Costa, MD   21 mg at 03/07/19 0846  . ondansetron (ZOFRAN) injection 4 mg  4 mg Intravenous Q8H PRN Ivor Costa, MD      . piperacillin-tazobactam (ZOSYN) IVPB 3.375 g  3.375 g Intravenous Q12H Dallie Piles, RPH 12.5 mL/hr at 03/07/19 1300 Rate Verify at 03/07/19 1300  . pneumococcal 23 valent vaccine (PNEUMOVAX-23) injection 0.5 mL  0.5 mL Intramuscular Prior to discharge Sreenath, Sudheer B, MD      . protein supplement (ENSURE MAX) liquid  11 oz Oral BID BM Dhungel, Nishant, MD   11 oz at 03/07/19 1346  . rosuvastatin (CRESTOR) tablet 10 mg  10 mg Oral Daily Ivor Costa, MD   10 mg at 03/07/19 0834  . thiamine (B-1) 841 mg, folic acid 1 mg in sodium chloride 0.9 % 50 mL  injection   Intravenous Q24H Dallie Piles, RPH 50 mL/hr at 03/07/19 0944 New Bag at 03/07/19 0944  . vancomycin variable dose per unstable renal function (pharmacist dosing)   Does not apply See admin instructions Dallie Piles, [facility]        VITAL SIGNS: BP 106/60 (BP Location: Right Arm)   Pulse 78   Temp 98.6 F (37 C) (Oral)   Resp (!) 27   Ht 6' (1.829 m)   Wt 184 lb 15.5 oz (83.9 kg)  SpO2 99%   BMI 25.09 kg/m  Filed Weights   03/03/19 1328 03/03/19 2000  Weight: 190 lb (86.2 kg) 184 lb 15.5 oz (83.9 kg)    Estimated body mass index is 25.09 kg/m as calculated from the following:   Height as of this encounter: 6' (1.829 m).   Weight as of this encounter: 184 lb 15.5 oz (83.9 kg).  LABS: CBC:    Component Value Date/Time   WBC 6.3 03/07/2019 0310   HGB 10.2 (L) 03/07/2019 0310   HCT 28.3 (L) 03/07/2019 0310   PLT 244 03/07/2019 0310   MCV 75.7 (L) 03/07/2019 0310   NEUTROABS 7.3 03/03/2019 1330   LYMPHSABS 0.8 03/03/2019 1330   MONOABS 0.7 03/03/2019 1330   EOSABS 0.0 03/03/2019 1330   BASOSABS 0.1 03/03/2019 1330   Comprehensive Metabolic Panel:    Component Value Date/Time   NA 130 (L) 03/07/2019 0310   K 3.3 (L) 03/07/2019 0310   CL 101 03/07/2019 0310   CO2 16 (L) 03/07/2019 0310   BUN 84 (H) 03/07/2019 0310   CREATININE 5.25 (H) 03/07/2019 0310   GLUCOSE 144 (H) 03/07/2019 0310   CALCIUM 8.2 (L) 03/07/2019 0310   AST 136 (H) 03/03/2019 1750   ALT 25 03/03/2019 1750   ALKPHOS 104 03/03/2019 1750   BILITOT 2.4 (H) 03/03/2019 1750   PROT 7.5 03/03/2019 1750   ALBUMIN 3.2 (L) 03/03/2019 1750    RADIOGRAPHIC STUDIES: CT HEAD WO CONTRAST  Result Date: 03/05/2019 CLINICAL DATA:  Metastatic disease evaluation.  Lung cancer. EXAM: CT HEAD WITHOUT CONTRAST TECHNIQUE: Contiguous axial images were obtained from the base of the skull through the vertex without intravenous contrast. COMPARISON:  MRI head 09/20/2018 FINDINGS: Brain: Mild atrophy. Mild  hypodensity in the white matter unchanged from the prior MRI. No acute infarct, hemorrhage, mass. No edema or midline shift. Vascular: Negative for hyperdense vessel Skull: Negative skull. Sinuses/Orbits: Mucosal edema and bony thickening right maxillary sinus. Chronic nasal bone fracture. Left cataract extraction. Other: None IMPRESSION: No acute abnormality and negative for metastatic disease on unenhanced CT. Atrophy and mild chronic microvascular ischemia. Electronically Signed   By: Franchot Gallo M.D.   On: 03/05/2019 15:28   US RENAL  Result Date: 03/04/2019 CLINICAL DATA:  Acute kidney injury EXAM: RENAL / URINARY TRACT ULTRASOUND COMPLETE COMPARISON:  CT 01/22/2019 FINDINGS: Right Kidney: Renal measurements: 11.4 x 6 x 6 cm = volume: 215 mL . Echogenicity within normal limits. No mass or hydronephrosis visualized. Left Kidney: Renal measurements: 11.8 x 7.3 x 6.5 cm = volume: 291.8 mL. Echogenicity within normal limits. No mass or hydronephrosis visualized. Bladder: Appears normal for degree of bladder distention. Other: Incidental note made of gallstones and small effusions. Heterogenous prostate. IMPRESSION: Normal ultrasound appearance of the kidneys Electronically Signed   By: Donavan Foil M.D.   On: 03/04/2019 15:37   DG Chest Portable 1 View  Result Date: 03/03/2019 CLINICAL DATA:  Shortness of breath, since Monday worsening this morning around 1 a.m. EXAM: PORTABLE CHEST 1 VIEW COMPARISON:  12/19/2018 FINDINGS: Cardiomediastinal contours are stable following median sternotomy with persistent left-sided pleural effusion and pleural-parenchymal scarring. No new areas of consolidation or evidence of right-sided pleural effusion. No signs of acute bone process. IMPRESSION: Persistent left-sided pleural effusion and pleural-parenchymal scarring. Electronically Signed   By: Zetta Bills M.D.   On: 03/03/2019 13:59   ECHOCARDIOGRAM COMPLETE  Result Date: 03/04/2019    ECHOCARDIOGRAM REPORT    Patient Name:   Arsh E  Meeker Date of Exam: 03/04/2019 Medical Rec #:  [id number]       Height:       72.0 in Accession #:    [id number]      Weight:       185.0 lb Date of Birth:  [birth year]       BSA:          2.061 m Patient Age:    20 years        BP:           125/72 mmHg Patient Gender: M               HR:           119 bpm. Exam Location:  [facility] Procedure: 2D Echo, Color Doppler and Cardiac Doppler Indications:     I21.4 NSTEMI  History:         Patient has prior history of Echocardiogram examinations, most                  recent 08/24/2018. CAD, Prior CABG; Risk Factors:Hypertension,                  Dyslipidemia and Diabetes. Lung Cancer.  Sonographer:     Charmayne Sheer RDCS (AE) Referring Phys:  [place] Phys: Kathlyn Sacramento MD IMPRESSIONS  1. Left ventricular ejection fraction, by estimation, is 30 to 35%. The left ventricle has moderately decreased function. The left ventricle demonstrates global hypokinesis. Left ventricular diastolic parameters are indeterminate.  2. Right ventricular systolic function is normal. The right ventricular size is normal. Tricuspid regurgitation signal is inadequate for assessing PA pressure.  3. Left atrial size was mild to moderately dilated.  4. The mitral valve is normal in structure and function. Moderate mitral valve regurgitation. No evidence of mitral stenosis.  5. The aortic valve is normal in structure and function. Aortic valve regurgitation is trivial. Mild to moderate aortic valve sclerosis/calcification is present, without any evidence of aortic stenosis.  6. The inferior vena cava is normal in size with greater than 50% respiratory variability, suggesting right atrial pressure of 3 mmHg. FINDINGS  Left Ventricle: Left ventricular ejection fraction, by estimation, is 30 to 35%. The left ventricle has moderately decreased function. The left ventricle demonstrates global hypokinesis. The left ventricular internal cavity size was normal in  size. There is no left ventricular hypertrophy. Left ventricular diastolic parameters are indeterminate. Right Ventricle: The right ventricular size is normal. No increase in right ventricular wall thickness. Right ventricular systolic function is normal. Tricuspid regurgitation signal is inadequate for assessing PA pressure. Left Atrium: Left atrial size was mild to moderately dilated. Right Atrium: Right atrial size was normal in size. Pericardium: There is no evidence of pericardial effusion. Mitral Valve: The mitral valve is normal in structure and function. Normal mobility of the mitral valve leaflets. Moderate mitral valve regurgitation. No evidence of mitral valve stenosis. MV peak gradient, 7.5 mmHg. The mean mitral valve gradient is 4.0  mmHg. Tricuspid Valve: The tricuspid valve is normal in structure. Tricuspid valve regurgitation is not demonstrated. No evidence of tricuspid stenosis. Aortic Valve: The aortic valve is normal in structure and function. Aortic valve regurgitation is trivial. Aortic regurgitation PHT measures 281 msec. Mild to moderate aortic valve sclerosis/calcification is present, without any evidence of aortic stenosis. Aortic valve mean gradient measures 5.0 mmHg. Aortic valve peak gradient measures 8.5 mmHg. Aortic valve area, by VTI measures 2.53 cm. Pulmonic Valve: The pulmonic valve  was normal in structure. Pulmonic valve regurgitation is not visualized. No evidence of pulmonic stenosis. Aorta: The aortic root is normal in size and structure. Venous: The inferior vena cava is normal in size with greater than 50% respiratory variability, suggesting right atrial pressure of 3 mmHg. IAS/Shunts: No atrial level shunt detected by color flow Doppler.  LEFT VENTRICLE PLAX 2D LVIDd:         5.21 cm  Diastology LVIDs:         4.21 cm  LV e' lateral:   7.83 cm/s LV PW:         0.84 cm  LV E/e' lateral: 13.4 LV IVS:        0.89 cm  LV e' medial:    7.94 cm/s LVOT diam:     2.40 cm  LV E/e'  medial:  13.2 LV SV:         54 LV SV Index:   26 LVOT Area:     4.52 cm  RIGHT VENTRICLE RV Basal diam:  3.08 cm LEFT ATRIUM             Index       RIGHT ATRIUM           Index LA diam:        4.70 cm 2.28 cm/m  RA Area:     14.10 cm LA Vol (A2C):   62.0 ml 30.08 ml/m RA Volume:   35.20 ml  17.08 ml/m LA Vol (A4C):   68.6 ml 33.29 ml/m LA Biplane Vol: 65.3 ml 31.68 ml/m  AORTIC VALVE                    PULMONIC VALVE AV Area (Vmax):    2.95 cm     PV Vmax:       1.10 m/s AV Area (Vmean):   2.86 cm     PV Vmean:      68.400 cm/s AV Area (VTI):     2.53 cm     PV VTI:        0.146 m AV Vmax:           146.00 cm/s  PV Peak grad:  4.8 mmHg AV Vmean:          103.000 cm/s PV Mean grad:  2.0 mmHg AV VTI:            0.213 m AV Peak Grad:      8.5 mmHg AV Mean Grad:      5.0 mmHg LVOT Vmax:         95.30 cm/s LVOT Vmean:        65.200 cm/s LVOT VTI:          0.119 m LVOT/AV VTI ratio: 0.56 AI PHT:            281 msec  AORTA Ao Root diam: 3.00 cm MITRAL VALVE MV Area (PHT): 3.92 cm     SHUNTS MV Peak grad:  7.5 mmHg     Systemic VTI:  0.12 m MV Mean grad:  4.0 mmHg     Systemic Diam: 2.40 cm MV Vmax:       1.37 m/s MV Vmean:      92.7 cm/s MV Decel Time: 194 msec MV E velocity: 104.80 cm/s Kathlyn Sacramento MD Electronically signed by Kathlyn Sacramento MD Signature Date/Time: 03/04/2019/2:46:43 PM    Final     PERFORMANCE STATUS (ECOG) : 3 - Symptomatic, >50% confined to bed  Review of Systems Unless otherwise noted, a complete review of systems is negative.  Physical Exam General: NAD Pulmonary: unlabored Abdomen: soft, nontender, + bowel sounds GU: no suprapubic tenderness Extremities: no edema, no joint deformities Skin: no rashes Neurological: Weakness, some confusion  IMPRESSION: Patient remains in ICU.  His mentation is improved but he has some residual confusion.  Serum creatinine continues to climb.  Nurse reports that his urine output has declined significantly today.  I called and spoke  with patient's wife by phone.  She says that she was told this morning that patient likely would not be a candidate for hemodialysis.  She says she would probably not want dialysis unless there was significant chance for clinical improvement.  We did discuss the option of hospice/comfort care in the event that patient declines.  She says that her primary goal is to "make sure that he is not in pain." Nephrology is following.   PLAN: -Continue current scope of treatment -DNR/DNI -Will follow.     Time Total: 15 minutes  Visit consisted of counseling and education dealing with the complex and emotionally intense issues of symptom management and palliative care in the setting of serious and potentially life-threatening illness.Greater than 50%  of this time was spent counseling and coordinating care related to the above assessment and plan.  Signed by: Altha Harm, PhD, NP-C

## 2019-03-07 NOTE — Progress Notes (Signed)
Speech Language Pathology Treatment: Dysphagia  Patient Details Name: Craig Koch MRN: [id number] DOB: 12/08/1955 Today's Date: 03/07/2019 Time: [id number] SLP Time Calculation (min) (ACUTE ONLY): 25 min  Assessment / Plan / Recommendation Clinical Impression  Pt seen for ongoing assessment of swallowing and toleration of diet; trials to upgrade diet when appropriate. Pt continues to present w/ weakness but seems min improved. NSG reported pt at bites and sips w/ breakfast meal this morning; No overt s/s of aspiration noted.  Pt consumed po trials of thin liquids w/ no overt s/s of aspiration noted; no decline in Pulmonary presentation, no decline in O2 sats or vocal quality. Pt able to help hold cup for drinking w/ setup support. Pt has been drinking thin liquids w/ NSG w/ good tolerance. However, pt's overall presentation of significant weakness does not suggest it is the appropriate time to upgrade food consistency to increased textures which would require increased mastication effort -- thus exertion. Such increased demand in exertion could increase respiratory demand which could in turn impact timing of pharyngeal swallowing leading to increased risk for aspiration. Pt may also take less po's d/t the increased demand in exertion. Recommend continue current diet w/ puree foods, thin liquids at this time for safe, optimal intake. Aspiration precautions, pills in puree. ST services will f/u next 1-2 days for ongoing assessment and trials to upgrade diet as appropriate. Discussed this w/ NSG who fully agreed in light of pt's overall weakness.     HPI HPI: Pt is a 64 y.o. male with medical history significant of hypertension, hyperlipidemia, diabetes mellitus, depression, anxiety, lung cancer, CAD, CABG, tobacco abuse, alcohol abuse, left pleural effusion, who presents with shortness of breath and generalized weakness.  Patient states that he has been having shortness of breath, dry cough and  generalized weakness the past several days, which has worsened today.  Denies chest pain, fever or chills.  He states that he normally has O2 saturation at about the 90s.  Patient is using accessory muscle for breathing.  Denies nausea, no vomiting, diarrhea, abdominal pain.  Denies symptoms of UTI or unilateral weakness.  No recent fall.  Pt was admitted w/ dx of New onset atrial fibrillation with RVR; DKA, AKI.  Per Palliative Oncology note: "stage IV adenocarcinoma of the lung with history of left sided malignant pleural effusion requiring Pleurx (later removed).  Patient has opted not to pursue chemotherapy and has instead been treated on immunotherapy.  Patient has had persistent shortness of breath.  CTA of the chest on 11/02/2018 revealed marked increase in tumor encasing the entire left lung with new diffuse increased interstitial thickening concerning for disease progression and lymphangitic spread of disease.    Repeat CT of the chest and abdomen on 01/22/2019 revealed significant interval decrease of tumor burden on the lung.".      SLP Plan  Continue with current plan of care       Recommendations  Diet recommendations: Dysphagia 1 (puree);Thin liquid Liquids provided via: Cup;No straw Medication Administration: Crushed with puree(for safer swallowing) Supervision: Patient able to self feed;Staff to assist with self feeding;Intermittent supervision to cue for compensatory strategies Compensations: Minimize environmental distractions;Slow rate;Small sips/bites;Lingual sweep for clearance of pocketing;Multiple dry swallows after each bite/sip;Follow solids with liquid Postural Changes and/or Swallow Maneuvers: Seated upright 90 degrees;Upright 30-60 min after meal                General recommendations: (dietician f/u) Oral Care Recommendations: Oral care BID;Oral care before and after  PO;Staff/trained caregiver to provide oral care Follow up Recommendations: None(TBD) SLP Visit  Diagnosis: Dysphagia, oral phase (R13.11)(weakness ) Plan: Continue with current plan of care       [place], [place], CCC-SLP Reeanna Acri 03/07/2019, 2:40 PM

## 2019-03-07 NOTE — Consult Note (Signed)
ANTICOAGULATION CONSULT NOTE  Pharmacy Consult for Heparin  Indication: atrial fibrillation- new onset  Patient Measurements: Height: 6' (182.9 cm) Weight: 184 lb 15.5 oz (83.9 kg) IBW/kg (Calculated) : 77.6 Heparin Dosing Weight: 86.2 kg   Vital Signs: Temp: 98.9 F (37.2 C) (03/04 0000) Temp Source: Oral (03/04 0000) BP: 117/64 (03/04 0600) Pulse Rate: 90 (03/04 0600)  Labs: Recent Labs    03/05/19 0153 03/05/19 0758 03/06/19 0152 03/06/19 0855 03/06/19 1449 03/07/19 0310  HGB 11.1*  --  11.0*  --   --  10.2*  HCT 31.1*  --  31.0*  --   --  28.3*  PLT 189  --  204  --   --  244  HEPARINUNFRC 0.33   < > 0.23* 0.37 0.32  --   CREATININE 3.16*  --  4.22*  --   --  5.25*   < > = values in this interval not displayed.    Estimated Creatinine Clearance: 15.8 mL/min (A) (by C-G formula based on SCr of 5.25 mg/dL (H)).   Medications:  Called RN to confirm whether pt has been on [facility] prior to admission. Nurse was not able to confirm as she was not in the patient's room. Per chart review, no AC prior to admission and confirmed with pharmacy technician who spoke to patient.   Heparin Course: 02/28 initiation: 1150 units/hr 02/28 2350 HL 0.26: inc to 1300 units/hr 03/01 0530 HL 0.31: no change 03/01 1059 HL 0.24: inc to 1450 units/hr 03/01 1849 HL 0.28: inc to 1550 units/hr 03/02 0153 HL 0.33: no change 03/02 0758 HL 0.28: inc to 1700 units/hr 03/02 1743 HL 0.30: no change 03/03 0152 HL 0.23: inc to 1950 units/hr 03/03 1449 HL 0.32: no change 03/04 0633 HL <0.10  Assessment: Craig Koch is a 64 y.o. male with a past medical history of anxiety, CAD, diabetes, and hyperlipidemia, hypertension, lung cancer, presents to the emergency department for shortness of breath. Pharmacy was consulted for heparin dosing in a patient with new onset atrial fibrillation. H&H trending down slightly, platelets are stable  Goal of Therapy:  Heparin level 0.3-0.7 units/ml Monitor  platelets by anticoagulation protocol: Yes   Plan:   heparin level undetectable and there were no reported interruptions to the infusion since the previous note: 2000 unit bolus then increase rate to 2200 units/hr  Recheck heparin level at 1900  Next CBC with am labs.   Pharmacy will continue to monitor and adjust per consult.   Dallie Piles 03/07/2019 7:13 AM

## 2019-03-07 NOTE — Progress Notes (Signed)
PROGRESS NOTE                                                                                                                                                                                                             Patient Demographics:    Craig Koch, is a 64 y.o. male, DOB - 1955-08-29, MWN:[id number]  Admit date - 03/03/2019   Admitting Physician Ivor Costa, MD  Outpatient Primary MD for the patient is Tonia Ghent, MD  LOS - 4  Outpatient Specialists: Oncology  Chief Complaint  Patient presents with  . Shortness of Breath       Brief Narrative 64 year old male with hypertension, hyperlipidemia, type 2 diabetes mellitus, anxiety depression, stage IV lung cancer, CAD with history of CABG, tobacco and alcohol use who presented with generalized weakness and shortness of breath.  In the ED he was found to be in DKA, AKI with creatinine of 1.6, new onset A. fib with RVR.  Required BiPAP and admission to stepdown unit. Hospital course prolonged due to persistent encephalopathy, rapid A. fib,?  Occult bacteremia and ATN.   Subjective:   Continues to be better oriented today.  Heart rate improved on the monitor.   Assessment  & Plan :    Principal Problem: New onset atrial fibrillation with RVR ([place]) Now converted to normal sinus.   Cardiology recommends to continue amiodarone 40 mg twice daily for 5 days and then reduce to 200 mg twice daily. Continue IV heparin and transition to Eliquis over the next few days. Metoprolol dose reduced to 12.5 mg twice daily given low blood pressure.    Active problems Acute tubular necrosis Suspect prerenal with hypovolemia from ketoacidosis.. Creatinine further worsened to 5.25 but urine output is maintained.  Continue strict I's/O and gentle hydration.  No indication for hemodialysis at this time.  Acute metabolic encephalopathy Continue empiric vancomycin and Zosyn for  occult infection with elevated procalcitonin. Mental status continues to improve.  Plan on empiric antibiotic for at least 5 days.  Blood culture so far without any growth.  Acute systolic CHF (EF 40-34%) NSTEMI Presented with markedly elevated troponin and suspect NSTEMI with respiratory failure and DKA. Continue IV heparin, beta-blocker and statin.  Metoprolol dose reduced.  Further ischemic evaluation upon resolution of acute issues.  Uncontrolled type II diabetes mellitus with DKA DKA  resolved on insulin drip.  CBG better after Lantus dose increased to 40 units daily.  Monitor with worsening renal function.  Alcohol abuse Encephalopathy could be contributed by withdrawal symptoms.  No active withdrawal noted on exam today.     Tobacco abuse Nicotine patch.   Stage IV lung adenocarcinoma Dr. Tasia Catchings following him in the hospital.  Recommend to hold dabrafenib and trametinib.  Hypothermia Secondary to DKA.  Resolved    Code Status : DNR (being followed by palliative care)  Family Communication  : Wife updated on the phone  Disposition Plan  : Continue stepdown monitoring given multiple active issues including rapid A. fib, metabolic encephalopathy, NSTEMI, occult infection  Barriers For Discharge : Ongoing several medical issues.  Consults  : Cardiology, nephrology, critical care, oncology  Procedures  : 2D echo, head CT  DVT Prophylaxis  : IV heparin  Lab Results  Component Value Date   PLT 244 03/07/2019    Antibiotics  :    Anti-infectives (From admission, onward)   Start     Dose/Rate Route Frequency Ordered Stop   03/07/19 1200  vancomycin (VANCOCIN) IVPB 1000 mg/200 mL premix     1,000 mg 200 mL/hr over 60 Minutes Intravenous  Once 03/07/19 0801 03/07/19 1310   03/06/19 2200  piperacillin-tazobactam (ZOSYN) IVPB 3.375 g     3.375 g 12.5 mL/hr over 240 Minutes Intravenous Every 12 hours 03/06/19 0858     03/05/19 1420  vancomycin variable dose per unstable  renal function (pharmacist dosing)      Does not apply See admin instructions 03/05/19 1420     03/05/19 1200  vancomycin (VANCOREADY) IVPB 1500 mg/300 mL     1,500 mg 150 mL/hr over 120 Minutes Intravenous  Once 03/05/19 0944 03/06/19 2324   03/05/19 1100  piperacillin-tazobactam (ZOSYN) IVPB 3.375 g  Status:  Discontinued     3.375 g 12.5 mL/hr over 240 Minutes Intravenous Every 8 hours 03/05/19 0944 03/06/19 0858        Objective:   Vitals:   03/07/19 1100 03/07/19 1200 03/07/19 1300 03/07/19 1343  BP: 112/64 117/60 114/60   Pulse: 80 80 78   Resp: (!) 29 (!) 31 (!) 27   Temp:      TempSrc:      SpO2: 100% 100% 100% 100%  Weight:      Height:        Wt Readings from Last 3 Encounters:  03/03/19 83.9 kg  02/14/19 86.6 kg  02/08/19 86.9 kg     Intake/Output Summary (Last 24 hours) at 03/07/2019 1359 Last data filed at 03/07/2019 1300 Gross per 24 hour  Intake 2720 ml  Output 705 ml  Net 2015 ml    Physical exam Arousable and more oriented today HEENT: Moist mucosa, supple neck Chest: Diminished bilateral breath sound CVs: Normal S1-S2, no murmurs GI: Soft, nondistended, nontender Musculoskeletal: Warm, no edema CNS: Alert and oriented x2    Data Review:    CBC Recent Labs  Lab 03/03/19 1330 03/03/19 1330 03/03/19 2359 03/04/19 1059 03/05/19 0153 03/06/19 0152 03/07/19 0310  WBC 9.4   < > 9.9 8.5 8.5 7.8 6.3  HGB 13.4   < > 12.2* 12.2* 11.1* 11.0* 10.2*  HCT 42.9   < > 36.2* 34.2* 31.1* 31.0* 28.3*  PLT 232   < > 192 210 189 204 244  MCV 87.4   < > 82.1 77.0* 77.8* 77.1* 75.7*  MCH 27.3   < > 27.7 27.5 27.8  27.4 27.3  MCHC 31.2   < > 33.7 35.7 35.7 35.5 36.0  RDW 16.8*   < > 16.7* 16.4* 17.1* 17.4* 17.5*  LYMPHSABS 0.8  --   --   --   --   --   --   MONOABS 0.7  --   --   --   --   --   --   EOSABS 0.0  --   --   --   --   --   --   BASOSABS 0.1  --   --   --   --   --   --    < > = values in this interval not displayed.    Chemistries    Recent Labs  Lab 03/03/19 1547 03/03/19 1547 03/03/19 1750 03/03/19 1936 03/04/19 0526 03/04/19 1059 03/05/19 0153 03/06/19 0152 03/07/19 0310  NA 123*   < > 123*   < > 127* 127* 128* 129* 130*  K 5.1   < > 5.4*   < > 3.5 3.7 3.5 3.3* 3.3*  CL 89*   < > 89*   < > 99 102 102 102 101  CO2 <7*   < > 8*   < > 14* 14* 15* 14* 16*  GLUCOSE 718*   < > 756*   < > 177* 134* 311* 182* 144*  BUN 37*   < > 43*   < > 53* 53* 65* 75* 84*  CREATININE 1.60*   < > 1.79*   < > 1.88* 2.13* 3.16* 4.22* 5.25*  CALCIUM 8.2*   < > 8.2*   < > 8.1* 8.0* 8.0* 8.2* 8.2*  MG  --   --  2.3  --   --   --  1.9 1.9  --   AST 81*  --  136*  --   --   --   --   --   --   ALT 18  --  25  --   --   --   --   --   --   ALKPHOS 80  --  104  --   --   --   --   --   --   BILITOT 2.3*  --  2.4*  --   --   --   --   --   --    < > = values in this interval not displayed.   ------------------------------------------------------------------------------------------------------------------ No results for input(s): CHOL, HDL, LDLCALC, TRIG, CHOLHDL, LDLDIRECT in the last 72 hours.  Lab Results  Component Value Date   HGBA1C 11.3 (H) 03/04/2019   ------------------------------------------------------------------------------------------------------------------ No results for input(s): TSH, T4TOTAL, T3FREE, THYROIDAB in the last 72 hours.  Invalid input(s): FREET3 ------------------------------------------------------------------------------------------------------------------ No results for input(s): VITAMINB12, FOLATE, FERRITIN, TIBC, IRON, RETICCTPCT in the last 72 hours.  Coagulation profile Recent Labs  Lab 03/03/19 1750  INR 1.2    No results for input(s): DDIMER in the last 72 hours.  Cardiac Enzymes No results for input(s): CKMB, TROPONINI, MYOGLOBIN in the last 168 hours.  Invalid input(s):  CK ------------------------------------------------------------------------------------------------------------------    Component Value Date/Time   BNP 330.0 (H) 03/03/2019 1330    Inpatient Medications  Scheduled Meds: . amiodarone  400 mg Oral BID  . aspirin EC  81 mg Oral Daily  . chlorhexidine  15 mL Mouth Rinse BID  . Chlorhexidine Gluconate Cloth  6 each Topical Daily  . dextromethorphan-guaiFENesin  1 tablet Oral BID  . dronabinol  5 mg Oral BID AC  . DULoxetine  30 mg Oral Daily  . insulin aspart  0-15 Units Subcutaneous Q4H  . insulin glargine  40 Units Subcutaneous Daily  . ipratropium-albuterol  3 mL Nebulization TID  . LORazepam  0-4 mg Intravenous Q12H  . mouth rinse  15 mL Mouth Rinse q12n4p  . metoprolol tartrate  12.5 mg Oral BID  . multivitamin with minerals  1 tablet Oral Daily  . nicotine  21 mg Transdermal Daily  . Ensure Max Protein  11 oz Oral BID BM  . rosuvastatin  10 mg Oral Daily  . vancomycin variable dose per unstable renal function (pharmacist dosing)   Does not apply See admin instructions   Continuous Infusions: . sodium chloride 75 mL/hr at 03/07/19 1343  . diltiazem (CARDIZEM) infusion 10 mg/hr (03/03/19 1815)  . heparin 2,200 Units/hr (03/07/19 1300)  . piperacillin-tazobactam (ZOSYN)  IV 12.5 mL/hr at 03/07/19 1300  . small volume/piggyback builder 50 mL/hr at 03/07/19 0944   PRN Meds:.acetaminophen, albuterol, dextrose, ondansetron (ZOFRAN) IV, pneumococcal 23 valent vaccine  Micro Results Recent Results (from the past 240 hour(s))  Respiratory Panel by RT PCR (Flu A&B, Covid) - Nasopharyngeal Swab     Status: None   Collection Time: 03/03/19  3:31 PM   Specimen: Nasopharyngeal Swab  Result Value Ref Range Status   SARS Coronavirus 2 by RT PCR NEGATIVE NEGATIVE Final    Comment: (NOTE) SARS-CoV-2 target nucleic acids are NOT DETECTED. The SARS-CoV-2 RNA is generally detectable in upper respiratoy specimens during the acute phase of  infection. The lowest concentration of SARS-CoV-2 viral copies this assay can detect is 131 copies/mL. A negative result does not preclude SARS-Cov-2 infection and should not be used as the sole basis for treatment or other patient management decisions. A negative result may occur with  improper specimen collection/handling, submission of specimen other than nasopharyngeal swab, presence of viral mutation(s) within the areas targeted by this assay, and inadequate number of viral copies (<131 copies/mL). A negative result must be combined with clinical observations, patient history, and epidemiological information. The expected result is Negative. Fact Sheet for Patients:  PinkCheek.be Fact Sheet for Healthcare Providers:  GravelBags.it This test is not yet ap proved or cleared by the [place] FDA and  has been authorized for detection and/or diagnosis of SARS-CoV-2 by FDA under an Emergency Use Authorization (EUA). This EUA will remain  in effect (meaning this test can be used) for the duration of the COVID-19 declaration under Section 564(b)(1) of the Act, 21 U.S.C. section 360bbb-3(b)(1), unless the authorization is terminated or revoked sooner.    Influenza A by PCR NEGATIVE NEGATIVE Final   Influenza B by PCR NEGATIVE NEGATIVE Final    Comment: (NOTE) The Xpert Xpress SARS-CoV-2/FLU/RSV assay is intended as an aid in  the diagnosis of influenza from Nasopharyngeal swab specimens and  should not be used as a sole basis for treatment. Nasal washings and  aspirates are unacceptable for Xpert Xpress SARS-CoV-2/FLU/RSV  testing. Fact Sheet for Patients: PinkCheek.be Fact Sheet for Healthcare Providers: GravelBags.it This test is not yet approved or cleared by the [place] FDA and  has been authorized for detection and/or diagnosis of SARS-CoV-2 by  FDA under  an Emergency Use Authorization (EUA). This EUA will remain  in effect (meaning this test can be used) for the duration of the  Covid-19 declaration under Section 564(b)(1) of the Act, 21  U.S.C. section 360bbb-3(b)(1), unless the authorization is  terminated  or revoked. Performed at [facility], [address] [place], [place] [address]   MRSA PCR Screening     Status: None   Collection Time: 03/03/19  7:48 PM   Specimen: Nasopharyngeal  Result Value Ref Range Status   MRSA by PCR NEGATIVE NEGATIVE Final    Comment:        The GeneXpert MRSA Assay (FDA approved for NASAL specimens only), is one component of a comprehensive MRSA colonization surveillance program. It is not intended to diagnose MRSA infection nor to guide or monitor treatment for MRSA infections. Performed at [facility], [address] [place], [place] [address]   CULTURE, BLOOD (ROUTINE X 2) w Reflex to ID Panel     Status: None (Preliminary result)   Collection Time: 03/05/19 10:16 AM   Specimen: BLOOD  Result Value Ref Range Status   Specimen Description BLOOD LEFT ANTECUBITAL  Final   Special Requests   Final    BOTTLES DRAWN AEROBIC AND ANAEROBIC Blood Culture adequate volume   Culture   Final    NO GROWTH 2 DAYS Performed at [facility], [address] [place], [place] [address]    Report Status PENDING  Incomplete  CULTURE, BLOOD (ROUTINE X 2) w Reflex to ID Panel     Status: None (Preliminary result)   Collection Time: 03/05/19 10:22 AM   Specimen: BLOOD  Result Value Ref Range Status   Specimen Description BLOOD BLOOD LEFT HAND  Final   Special Requests   Final    BOTTLES DRAWN AEROBIC ONLY Blood Culture adequate volume   Culture   Final    NO GROWTH 2 DAYS Performed at [facility], [address], [place], [place] [address]    Report Status PENDING  Incomplete    Radiology Reports CT HEAD WO CONTRAST  Result Date: 03/05/2019 CLINICAL DATA:   Metastatic disease evaluation.  Lung cancer. EXAM: CT HEAD WITHOUT CONTRAST TECHNIQUE: Contiguous axial images were obtained from the base of the skull through the vertex without intravenous contrast. COMPARISON:  MRI head 09/20/2018 FINDINGS: Brain: Mild atrophy. Mild hypodensity in the white matter unchanged from the prior MRI. No acute infarct, hemorrhage, mass. No edema or midline shift. Vascular: Negative for hyperdense vessel Skull: Negative skull. Sinuses/Orbits: Mucosal edema and bony thickening right maxillary sinus. Chronic nasal bone fracture. Left cataract extraction. Other: None IMPRESSION: No acute abnormality and negative for metastatic disease on unenhanced CT. Atrophy and mild chronic microvascular ischemia. Electronically Signed   By: Franchot Gallo M.D.   On: 03/05/2019 15:28   US RENAL  Result Date: 03/04/2019 CLINICAL DATA:  Acute kidney injury EXAM: RENAL / URINARY TRACT ULTRASOUND COMPLETE COMPARISON:  CT 01/22/2019 FINDINGS: Right Kidney: Renal measurements: 11.4 x 6 x 6 cm = volume: 215 mL . Echogenicity within normal limits. No mass or hydronephrosis visualized. Left Kidney: Renal measurements: 11.8 x 7.3 x 6.5 cm = volume: 291.8 mL. Echogenicity within normal limits. No mass or hydronephrosis visualized. Bladder: Appears normal for degree of bladder distention. Other: Incidental note made of gallstones and small effusions. Heterogenous prostate. IMPRESSION: Normal ultrasound appearance of the kidneys Electronically Signed   By: Donavan Foil M.D.   On: 03/04/2019 15:37   DG Chest Portable 1 View  Result Date: 03/03/2019 CLINICAL DATA:  Shortness of breath, since Monday worsening this morning around 1 a.m. EXAM: PORTABLE CHEST 1 VIEW COMPARISON:  12/19/2018 FINDINGS: Cardiomediastinal contours are stable following median sternotomy with persistent left-sided pleural effusion and  pleural-parenchymal scarring. No new areas of consolidation or evidence of right-sided pleural effusion.  No signs of acute bone process. IMPRESSION: Persistent left-sided pleural effusion and pleural-parenchymal scarring. Electronically Signed   By: Zetta Bills M.D.   On: 03/03/2019 13:59   ECHOCARDIOGRAM COMPLETE  Result Date: 03/04/2019    ECHOCARDIOGRAM REPORT   Patient Name:   Craig Koch Date of Exam: 03/04/2019 Medical Rec #:  [id number]       Height:       72.0 in Accession #:    [id number]      Weight:       185.0 lb Date of Birth:  10-22-1955       BSA:          2.061 m Patient Age:    38 years        BP:           125/72 mmHg Patient Gender: M               HR:           119 bpm. Exam Location:  [facility] Procedure: 2D Echo, Color Doppler and Cardiac Doppler Indications:     I21.4 NSTEMI  History:         Patient has prior history of Echocardiogram examinations, most                  recent 08/24/2018. CAD, Prior CABG; Risk Factors:Hypertension,                  Dyslipidemia and Diabetes. Lung Cancer.  Sonographer:     Charmayne Sheer RDCS (AE) Referring Phys:  [place] Phys: Kathlyn Sacramento MD IMPRESSIONS  1. Left ventricular ejection fraction, by estimation, is 30 to 35%. The left ventricle has moderately decreased function. The left ventricle demonstrates global hypokinesis. Left ventricular diastolic parameters are indeterminate.  2. Right ventricular systolic function is normal. The right ventricular size is normal. Tricuspid regurgitation signal is inadequate for assessing PA pressure.  3. Left atrial size was mild to moderately dilated.  4. The mitral valve is normal in structure and function. Moderate mitral valve regurgitation. No evidence of mitral stenosis.  5. The aortic valve is normal in structure and function. Aortic valve regurgitation is trivial. Mild to moderate aortic valve sclerosis/calcification is present, without any evidence of aortic stenosis.  6. The inferior vena cava is normal in size with greater than 50% respiratory variability, suggesting right atrial pressure  of 3 mmHg. FINDINGS  Left Ventricle: Left ventricular ejection fraction, by estimation, is 30 to 35%. The left ventricle has moderately decreased function. The left ventricle demonstrates global hypokinesis. The left ventricular internal cavity size was normal in size. There is no left ventricular hypertrophy. Left ventricular diastolic parameters are indeterminate. Right Ventricle: The right ventricular size is normal. No increase in right ventricular wall thickness. Right ventricular systolic function is normal. Tricuspid regurgitation signal is inadequate for assessing PA pressure. Left Atrium: Left atrial size was mild to moderately dilated. Right Atrium: Right atrial size was normal in size. Pericardium: There is no evidence of pericardial effusion. Mitral Valve: The mitral valve is normal in structure and function. Normal mobility of the mitral valve leaflets. Moderate mitral valve regurgitation. No evidence of mitral valve stenosis. MV peak gradient, 7.5 mmHg. The mean mitral valve gradient is 4.0  mmHg. Tricuspid Valve: The tricuspid valve is normal in structure. Tricuspid valve regurgitation is not demonstrated. No evidence of tricuspid stenosis.  Aortic Valve: The aortic valve is normal in structure and function. Aortic valve regurgitation is trivial. Aortic regurgitation PHT measures 281 msec. Mild to moderate aortic valve sclerosis/calcification is present, without any evidence of aortic stenosis. Aortic valve mean gradient measures 5.0 mmHg. Aortic valve peak gradient measures 8.5 mmHg. Aortic valve area, by VTI measures 2.53 cm. Pulmonic Valve: The pulmonic valve was normal in structure. Pulmonic valve regurgitation is not visualized. No evidence of pulmonic stenosis. Aorta: The aortic root is normal in size and structure. Venous: The inferior vena cava is normal in size with greater than 50% respiratory variability, suggesting right atrial pressure of 3 mmHg. IAS/Shunts: No atrial level shunt detected  by color flow Doppler.  LEFT VENTRICLE PLAX 2D LVIDd:         5.21 cm  Diastology LVIDs:         4.21 cm  LV e' lateral:   7.83 cm/s LV PW:         0.84 cm  LV E/e' lateral: 13.4 LV IVS:        0.89 cm  LV e' medial:    7.94 cm/s LVOT diam:     2.40 cm  LV E/e' medial:  13.2 LV SV:         54 LV SV Index:   26 LVOT Area:     4.52 cm  RIGHT VENTRICLE RV Basal diam:  3.08 cm LEFT ATRIUM             Index       RIGHT ATRIUM           Index LA diam:        4.70 cm 2.28 cm/m  RA Area:     14.10 cm LA Vol (A2C):   62.0 ml 30.08 ml/m RA Volume:   35.20 ml  17.08 ml/m LA Vol (A4C):   68.6 ml 33.29 ml/m LA Biplane Vol: 65.3 ml 31.68 ml/m  AORTIC VALVE                    PULMONIC VALVE AV Area (Vmax):    2.95 cm     PV Vmax:       1.10 m/s AV Area (Vmean):   2.86 cm     PV Vmean:      68.400 cm/s AV Area (VTI):     2.53 cm     PV VTI:        0.146 m AV Vmax:           146.00 cm/s  PV Peak grad:  4.8 mmHg AV Vmean:          103.000 cm/s PV Mean grad:  2.0 mmHg AV VTI:            0.213 m AV Peak Grad:      8.5 mmHg AV Mean Grad:      5.0 mmHg LVOT Vmax:         95.30 cm/s LVOT Vmean:        65.200 cm/s LVOT VTI:          0.119 m LVOT/AV VTI ratio: 0.56 AI PHT:            281 msec  AORTA Ao Root diam: 3.00 cm MITRAL VALVE MV Area (PHT): 3.92 cm     SHUNTS MV Peak grad:  7.5 mmHg     Systemic VTI:  0.12 m MV Mean grad:  4.0 mmHg     Systemic Diam: 2.40  cm MV Vmax:       1.37 m/s MV Vmean:      92.7 cm/s MV Decel Time: 194 msec MV E velocity: 104.80 cm/s Kathlyn Sacramento MD Electronically signed by Kathlyn Sacramento MD Signature Date/Time: 03/04/2019/2:46:43 PM    Final     Time Spent in minutes 35   Arali Somera M.D on 03/07/2019 at 1:59 PM  Between 7am to 7pm - Pager - [phone]  After 7pm go to [email or web address] - password [facility]  [facility] -  Office  [phone]

## 2019-03-07 NOTE — Progress Notes (Signed)
[facility] [place], [place] 03/07/19  Subjective:   Hospital day # 4 S Creatinine trend is worsening  UOP borderline low at 825 cc    Renal: 03/03 0701 - 03/04 0700 In: 2643.5 [P.O.:340; I.V.:2253.3; IV Piggyback:50.2] Out: 825 [Urine:825] Lab Results  Component Value Date   CREATININE 5.25 (H) 03/07/2019   CREATININE 4.22 (H) 03/06/2019   CREATININE 3.16 (H) 03/05/2019   Able to drink a little ensure Foley was removed last night  Objective:  Vital signs in last 24 hours:  Temp:  [97.9 F (36.6 C)-98.9 F (37.2 C)] 98.6 F (37 C) (03/04 0700) Pulse Rate:  [63-97] 80 (03/04 0900) Resp:  [20-41] 28 (03/04 0900) BP: (89-129)/(48-72) 117/66 (03/04 0900) SpO2:  [82 %-100 %] 100 % (03/04 0900)  Weight change:  Filed Weights   03/03/19 1328 03/03/19 2000  Weight: 86.2 kg 83.9 kg    Intake/Output:    Intake/Output Summary (Last 24 hours) at 03/07/2019 0931 Last data filed at 03/07/2019 0800 Gross per 24 hour  Intake 2713.92 ml  Output 775 ml  Net 1938.92 ml     Physical Exam: General:  Ill-appearing, laying in the bed  HEENT  dry oral mucous membranes  Pulm/lungs  normal effort, clear anteriorly and laterally  CVS/Heart  regular rhythm  Abdomen:   Soft, nontender  Extremities:  Trace to 1+ pitting edema  Neurologic:  Lethargic, able to answer few simple questions  Skin:  Warm, dry          Basic Metabolic Panel:  Recent Labs  Lab 03/03/19 1750 03/03/19 1936 03/04/19 0526 03/04/19 0526 03/04/19 1059 03/04/19 1059 03/05/19 0153 03/06/19 0152 03/07/19 0310  NA 123*   < > 127*  --  127*  --  128* 129* 130*  K 5.4*   < > 3.5  --  3.7  --  3.5 3.3* 3.3*  CL 89*   < > 99  --  102  --  102 102 101  CO2 8*   < > 14*  --  14*  --  15* 14* 16*  GLUCOSE 756*   < > 177*  --  134*  --  311* 182* 144*  BUN 43*   < > 53*  --  53*  --  65* 75* 84*  CREATININE 1.79*   < > 1.88*  --  2.13*  --  3.16* 4.22* 5.25*  CALCIUM 8.2*   < > 8.1*   < >  8.0*   < > 8.0* 8.2* 8.2*  MG 2.3  --   --   --   --   --  1.9 1.9  --   PHOS  --   --   --   --   --   --  1.4*  --   --    < > = values in this interval not displayed.     CBC: Recent Labs  Lab 03/03/19 1330 03/03/19 1330 03/03/19 2359 03/04/19 1059 03/05/19 0153 03/06/19 0152 03/07/19 0310  WBC 9.4   < > 9.9 8.5 8.5 7.8 6.3  NEUTROABS 7.3  --   --   --   --   --   --   HGB 13.4   < > 12.2* 12.2* 11.1* 11.0* 10.2*  HCT 42.9   < > 36.2* 34.2* 31.1* 31.0* 28.3*  MCV 87.4   < > 82.1 77.0* 77.8* 77.1* 75.7*  PLT 232   < > 192 210 189 204 244   < > =  values in this interval not displayed.     No results found for: HEPBSAG, HEPBSAB, HEPBIGM    Microbiology:  Recent Results (from the past 240 hour(s))  Respiratory Panel by RT PCR (Flu A&B, Covid) - Nasopharyngeal Swab     Status: None   Collection Time: 03/03/19  3:31 PM   Specimen: Nasopharyngeal Swab  Result Value Ref Range Status   SARS Coronavirus 2 by RT PCR NEGATIVE NEGATIVE Final    Comment: (NOTE) SARS-CoV-2 target nucleic acids are NOT DETECTED. The SARS-CoV-2 RNA is generally detectable in upper respiratoy specimens during the acute phase of infection. The lowest concentration of SARS-CoV-2 viral copies this assay can detect is 131 copies/mL. A negative result does not preclude SARS-Cov-2 infection and should not be used as the sole basis for treatment or other patient management decisions. A negative result may occur with  improper specimen collection/handling, submission of specimen other than nasopharyngeal swab, presence of viral mutation(s) within the areas targeted by this assay, and inadequate number of viral copies (<131 copies/mL). A negative result must be combined with clinical observations, patient history, and epidemiological information. The expected result is Negative. Fact Sheet for Patients:  PinkCheek.be Fact Sheet for Healthcare Providers:   GravelBags.it This test is not yet ap proved or cleared by the [place] FDA and  has been authorized for detection and/or diagnosis of SARS-CoV-2 by FDA under an Emergency Use Authorization (EUA). This EUA will remain  in effect (meaning this test can be used) for the duration of the COVID-19 declaration under Section 564(b)(1) of the Act, 21 U.S.C. section 360bbb-3(b)(1), unless the authorization is terminated or revoked sooner.    Influenza A by PCR NEGATIVE NEGATIVE Final   Influenza B by PCR NEGATIVE NEGATIVE Final    Comment: (NOTE) The Xpert Xpress SARS-CoV-2/FLU/RSV assay is intended as an aid in  the diagnosis of influenza from Nasopharyngeal swab specimens and  should not be used as a sole basis for treatment. Nasal washings and  aspirates are unacceptable for Xpert Xpress SARS-CoV-2/FLU/RSV  testing. Fact Sheet for Patients: PinkCheek.be Fact Sheet for Healthcare Providers: GravelBags.it This test is not yet approved or cleared by the [place] FDA and  has been authorized for detection and/or diagnosis of SARS-CoV-2 by  FDA under an Emergency Use Authorization (EUA). This EUA will remain  in effect (meaning this test can be used) for the duration of the  Covid-19 declaration under Section 564(b)(1) of the Act, 21  U.S.C. section 360bbb-3(b)(1), unless the authorization is  terminated or revoked. Performed at [facility], [address] [place], [place] [address]   MRSA PCR Screening     Status: None   Collection Time: 03/03/19  7:48 PM   Specimen: Nasopharyngeal  Result Value Ref Range Status   MRSA by PCR NEGATIVE NEGATIVE Final    Comment:        The GeneXpert MRSA Assay (FDA approved for NASAL specimens only), is one component of a comprehensive MRSA colonization surveillance program. It is not intended to diagnose MRSA infection nor to guide or monitor  treatment for MRSA infections. Performed at [facility], [address] [place], [place] [address]   CULTURE, BLOOD (ROUTINE X 2) w Reflex to ID Panel     Status: None (Preliminary result)   Collection Time: 03/05/19 10:16 AM   Specimen: BLOOD  Result Value Ref Range Status   Specimen Description BLOOD LEFT ANTECUBITAL  Final   Special Requests   Final  BOTTLES DRAWN AEROBIC AND ANAEROBIC Blood Culture adequate volume   Culture   Final    NO GROWTH 2 DAYS Performed at [facility], [address] [place], [place] [address]    Report Status PENDING  Incomplete  CULTURE, BLOOD (ROUTINE X 2) w Reflex to ID Panel     Status: None (Preliminary result)   Collection Time: 03/05/19 10:22 AM   Specimen: BLOOD  Result Value Ref Range Status   Specimen Description BLOOD BLOOD LEFT HAND  Final   Special Requests   Final    BOTTLES DRAWN AEROBIC ONLY Blood Culture adequate volume   Culture   Final    NO GROWTH 2 DAYS Performed at [facility], [address], [place], [place] [address]    Report Status PENDING  Incomplete    Coagulation Studies: No results for input(s): LABPROT, INR in the last 72 hours.  Urinalysis: No results for input(s): COLORURINE, LABSPEC, PHURINE, GLUCOSEU, HGBUR, BILIRUBINUR, KETONESUR, PROTEINUR, UROBILINOGEN, NITRITE, LEUKOCYTESUR in the last 72 hours.  Invalid input(s): APPERANCEUR    Imaging: CT HEAD WO CONTRAST  Result Date: 03/05/2019 CLINICAL DATA:  Metastatic disease evaluation.  Lung cancer. EXAM: CT HEAD WITHOUT CONTRAST TECHNIQUE: Contiguous axial images were obtained from the base of the skull through the vertex without intravenous contrast. COMPARISON:  MRI head 09/20/2018 FINDINGS: Brain: Mild atrophy. Mild hypodensity in the white matter unchanged from the prior MRI. No acute infarct, hemorrhage, mass. No edema or midline shift. Vascular: Negative for hyperdense vessel Skull: Negative skull. Sinuses/Orbits: Mucosal  edema and bony thickening right maxillary sinus. Chronic nasal bone fracture. Left cataract extraction. Other: None IMPRESSION: No acute abnormality and negative for metastatic disease on unenhanced CT. Atrophy and mild chronic microvascular ischemia. Electronically Signed   By: Franchot Gallo M.D.   On: 03/05/2019 15:28     Medications:   . dextrose 5% lactated ringers 75 mL/hr at 03/07/19 0800  . diltiazem (CARDIZEM) infusion 10 mg/hr (03/03/19 1815)  . heparin 1,950 Units/hr (03/07/19 0800)  . piperacillin-tazobactam (ZOSYN)  IV Stopped (03/07/19 0202)  . small volume/piggyback builder 50 mL/hr at 03/06/19 1144  . vancomycin     . amiodarone  400 mg Oral BID  . aspirin EC  81 mg Oral Daily  . chlorhexidine  15 mL Mouth Rinse BID  . Chlorhexidine Gluconate Cloth  6 each Topical Daily  . dextromethorphan-guaiFENesin  1 tablet Oral BID  . dronabinol  5 mg Oral BID AC  . DULoxetine  30 mg Oral Daily  . heparin  2,000 Units Intravenous Once  . insulin aspart  0-15 Units Subcutaneous Q4H  . insulin glargine  40 Units Subcutaneous Daily  . ipratropium-albuterol  3 mL Nebulization TID  . LORazepam  0-4 mg Intravenous Q12H  . mouth rinse  15 mL Mouth Rinse q12n4p  . metoprolol tartrate  12.5 mg Oral BID  . multivitamin with minerals  1 tablet Oral Daily  . nicotine  21 mg Transdermal Daily  . Ensure Max Protein  11 oz Oral BID BM  . rosuvastatin  10 mg Oral Daily  . vancomycin variable dose per unstable renal function (pharmacist dosing)   Does not apply See admin instructions   acetaminophen, albuterol, dextrose, ondansetron (ZOFRAN) IV, pneumococcal 23 valent vaccine  Assessment/ Plan:  64 y.o. male with hypertension, hyperlipidemia, diabetes, depression, anxiety, adenocarcinoma of the lung stage IV, coronary disease, history of CABG, tobacco and alcohol abuse, left pleural effusion,  admitted on 03/03/2019 for DKA (diabetic  ketoacidoses) ([place]) [E11.10] New onset atrial fibrillation  ([place]) [I48.91] Atrial fibrillation with rapid ventricular response ([place]) [I48.91] Diabetic ketoacidosis without coma associated with other specified diabetes mellitus ([place]) [E13.10]     #Acute kidney injury, Metabolic acidosis Baseline creatinine of 0.7 on February 5.  Patient did have IV contrast exposure on January 19. Renal imaging: Kidney ultrasound March 1: Normal kidneys  Acute kidney injury is likely secondary to ATN caused by hypotension, hemodynamic instability. Currently he is nonoliguric with worsening renal parameters  Avoid further hypotension Gentle IV hydration Supportive care Today's creatinine has increased to 5.25 but UOP is maintained No acute indication for dialysis at present but may need this admission    #Hyponatremia Likely multifactorial with contribution from acute kidney injury and also possibility of liver disease from reported alcohol abuse  #Diabetic ketoacidosis, poorly controlled diabetes Management as per ICU team Lab Results  Component Value Date   HGBA1C 11.3 (H) 03/04/2019   Multiple underlying chronic illnesses, poorly controlled diabetes, reported history of tobacco and alcohol abuse, with underlying coronary disease, history of CABG, CHF with EF 30 to 35%, stage IV adenocarcinoma  # Hypokalemia Oral replacement as necessary       LOS: [place] 3/4/20219:31 AM  [facility] [place], [place]  Note: This note was prepared with Dragon dictation. Any transcription errors are unintentional

## 2019-03-07 NOTE — Evaluation (Signed)
Physical Therapy Evaluation Patient Details Name: Craig Koch MRN: [id number] DOB: 24-Apr-1955 Today's Date: 03/07/2019   History of Present Illness  64 y.o. male with medical history significant of hypertension, hyperlipidemia, diabetes mellitus, depression, anxiety, lung cancer, CAD, CABG, tobacco abuse, alcohol abuse, left pleural effusion, who presents with shortness of breath and generalized weakness  Clinical Impression  Pt very weak but willing to work with PT and showed reasonable strength with exercises and functional tasks in addition to the eval.  He did not c/o pain t/o the effort and though he had some dizziness (and soft BP, that did drop slightly with orthostatic position changes) he showed great willingness to try mobility tasks.  Pt did very poorly with standing attempt and essentially needed +2 total assist just to remain upright for a few seconds but again with bed exercises and bed mobility tasks he did better than expected.  Overall pt remains very weak and functionally limited and would not be safe to return home at this time even with 24/7 assist.  Pt hopes to regain enough strength to go home but does understand the need for rehab at this point.      Follow Up Recommendations SNF;Supervision/Assistance - 24 hour    Equipment Recommendations  (TBD at next venue of care)    Recommendations for Other Services       Precautions / Restrictions Precautions Precautions: Fall Restrictions Weight Bearing Restrictions: No      Mobility  Bed Mobility Overal bed mobility: Needs Assistance Bed Mobility: Supine to Sit     Supine to sit: Min assist     General bed mobility comments: Pt did well with getting to EOB.  Needed extra time and effort, heavy UE use, but overall did better than expected  Transfers Overall transfer level: Needs assistance Equipment used: Rolling walker (2 wheeled);2 person hand held assist Transfers: Sit to/from Stand Sit to Stand: Max  assist;+2 physical assistance         General transfer comment: Pt only able to attain standing with heavy +2 assist, needed to sit after <5 seconds with inability to really get weight over walker or actually stand  Ambulation/Gait                Stairs            Wheelchair Mobility    Modified Rankin (Stroke Patients Only)       Balance Overall balance assessment: Needs assistance   Sitting balance-Leahy Scale: Fair Sitting balance - Comments: Pt able to maintain sitting balance relatively well, BP did drop minimally with supine to sit with some lightheadedness, but able to maintain upright     Standing balance-Leahy Scale: Zero Standing balance comment: heavy +2 assist just to remain upright                             Pertinent Vitals/Pain Pain Assessment: No/denies pain    Home Living Family/patient expects to be discharged to:: Skilled nursing facility Living Arrangements: Spouse/significant other     Home Access: Stairs to enter Entrance Stairs-Rails: Can reach both Entrance Stairs-Number of Steps: 3 Home Layout: One level Home Equipment: Walker - 2 wheels      Prior Function Level of Independence: Independent         Comments: Pt reports he does not go out a lot, but is able to walk outside around the home, etc w/o issue  Hand Dominance        Extremity/Trunk Assessment   Upper Extremity Assessment Upper Extremity Assessment: Generalized weakness(slow, labored against gravity movement t/o)    Lower Extremity Assessment Lower Extremity Assessment: Generalized weakness       Communication   Communication: No difficulties  Cognition Arousal/Alertness: Awake/alert Behavior During Therapy: Flat affect Overall Cognitive Status: Within Functional Limits for tasks assessed                                 General Comments: Pt sleepy and fatigue, but generally able to follow instructions      General  Comments      Exercises General Exercises - Lower Extremity Ankle Circles/Pumps: AROM;10 reps Heel Slides: Strengthening;5 reps;Both Hip ABduction/ADduction: Strengthening;5 reps;Both Straight Leg Raises: AAROM;AROM;5 reps;Both   Assessment/Plan    PT Assessment Patient needs continued PT services  PT Problem List Decreased range of motion;Decreased activity tolerance;Decreased balance;Decreased mobility;Decreased coordination;Decreased cognition;Decreased knowledge of use of DME;Decreased strength;Decreased safety awareness       PT Treatment Interventions DME instruction;Gait training;Functional mobility training;Stair training;Therapeutic activities;Therapeutic exercise;Balance training;Neuromuscular re-education;Patient/family education;Cognitive remediation    PT Goals (Current goals can be found in the Care Plan section)  Acute Rehab PT Goals Patient Stated Goal: get stronger PT Goal Formulation: With patient Time For Goal Achievement: 03/21/19 Potential to Achieve Goals: Fair    Frequency Min 2X/week   Barriers to discharge        Co-evaluation               AM-PAC PT "6 Clicks" Mobility  Outcome Measure Help needed turning from your back to your side while in a flat bed without using bedrails?: A Little Help needed moving from lying on your back to sitting on the side of a flat bed without using bedrails?: A Little Help needed moving to and from a bed to a chair (including a wheelchair)?: A Lot Help needed standing up from a chair using your arms (e.g., wheelchair or bedside chair)?: Total Help needed to walk in hospital room?: Total Help needed climbing 3-5 steps with a railing? : Total 6 Click Score: 11    End of Session Equipment Utilized During Treatment: Gait belt Activity Tolerance: Patient limited by fatigue Patient left: with bed alarm set;with call bell/phone within reach Nurse Communication: Mobility status PT Visit Diagnosis: Muscle weakness  (generalized) (M62.81);Difficulty in walking, not elsewhere classified (R26.2);Unsteadiness on feet (R26.81)    Time: 9417-4081 PT Time Calculation (min) (ACUTE ONLY): 47 min   Charges:   PT Evaluation $PT Eval Moderate Complexity: 1 Mod PT Treatments $Therapeutic Exercise: 8-22 mins $Therapeutic Activity: 8-22 mins        Kreg Shropshire, DPT 03/07/2019, 5:08 PM

## 2019-03-07 NOTE — Consult Note (Signed)
Pharmacy Antibiotic Note  Craig Koch is a 64 y.o. male admitted on 03/03/2019 with medical history significant ofhypertension, hyperlipidemia, diabetes mellitus, depression, anxiety, lung cancer, CAD, CABG, tobacco abuse, alcohol abuse, left pleural effusion,who presents with new onset atrial fibillation and now with sepsis of an unclear origin. Pharmacy has been consulted for vancomycin and Zosyn dosing. This is day # 3 of antibiotics  Plan: 1) continue Zosyn 3.375g IV to q12 h (4 hour infusion)  2) Vancomycin 1000 mg IV x 1 based on this morning's random vancomycin level of 14 mcg/mL  random vancomycin levels in the morning to guide clearance and further dosing  BMP in am  Height: 6' (182.9 cm) Weight: 184 lb 15.5 oz (83.9 kg) IBW/kg (Calculated) : 77.6  Temp (24hrs), Avg:98.2 F (36.8 C), Min:97.9 F (36.6 C), Max:98.9 F (37.2 C)  Recent Labs  Lab 03/03/19 2359 03/03/19 2359 03/04/19 0526 03/04/19 1059 03/05/19 0153 03/06/19 0152 03/07/19 0310  WBC 9.9  --   --  8.5 8.5 7.8 6.3  CREATININE 1.98*   < > 1.88* 2.13* 3.16* 4.22* 5.25*  VANCORANDOM  --   --   --   --   --  16 14   < > = values in this interval not displayed.    Estimated Creatinine Clearance: 15.8 mL/min (A) (by C-G formula based on SCr of 5.25 mg/dL (H)).    Antimicrobials this admission: vancomycin 3/2 >>  Zosyn 3/2 >>   Microbiology results: 3/2 BCx: no growth  2/28 SARS CoV-2: negative  2/28 influenza A/B: negative 2/28 MRSA PCR: negative  Thank you for allowing pharmacy to be a part of this patient's care.  Dallie Piles 03/07/2019 7:14 AM

## 2019-03-07 NOTE — Progress Notes (Signed)
Progress Note  Patient Name: Craig Koch Date of Encounter: 03/07/2019  Primary Cardiologist: Rockey Situ  Subjective   Up sitting in bed, alert. States he does not feel well this morning, though does not elaborate beyond this. Food at bedside, not much of an appetite. Maintaining sinus rhythm as of around 11 AM on 3/3. IV amiodarone stopped 3/3 secondary to hypotension. Renal function continues to worsen. Potassium 3.3 this morning. Remains on heparin gtt with  HGB 10.2.   Inpatient Medications    Scheduled Meds: . amiodarone  400 mg Oral BID  . aspirin EC  81 mg Oral Daily  . chlorhexidine  15 mL Mouth Rinse BID  . Chlorhexidine Gluconate Cloth  6 each Topical Daily  . dextromethorphan-guaiFENesin  1 tablet Oral BID  . dronabinol  5 mg Oral BID AC  . DULoxetine  30 mg Oral Daily  . insulin aspart  0-15 Units Subcutaneous Q4H  . insulin glargine  40 Units Subcutaneous Daily  . ipratropium-albuterol  3 mL Nebulization TID  . LORazepam  0-4 mg Intravenous Q12H  . mouth rinse  15 mL Mouth Rinse q12n4p  . metoprolol tartrate  25 mg Oral BID  . multivitamin with minerals  1 tablet Oral Daily  . nicotine  21 mg Transdermal Daily  . Ensure Max Protein  11 oz Oral BID BM  . rosuvastatin  10 mg Oral Daily  . vancomycin variable dose per unstable renal function (pharmacist dosing)   Does not apply See admin instructions   Continuous Infusions: . dextrose 5% lactated ringers 75 mL/hr at 03/07/19 0800  . diltiazem (CARDIZEM) infusion 10 mg/hr (03/03/19 1815)  . heparin 1,950 Units/hr (03/07/19 0800)  . piperacillin-tazobactam (ZOSYN)  IV Stopped (03/07/19 0202)  . small volume/piggyback builder 50 mL/hr at 03/06/19 1144  . vancomycin     PRN Meds: acetaminophen, albuterol, dextrose, ondansetron (ZOFRAN) IV, pneumococcal 23 valent vaccine   Vital Signs    Vitals:   03/07/19 0506 03/07/19 0600 03/07/19 0700 03/07/19 0800  BP: 126/64 117/64 121/72 124/67  Pulse: 97 90 88 92    Resp: (!) 33 (!) 28 (!) 24 (!) 30  Temp:   98.6 F (37 C)   TempSrc:   Oral   SpO2: 95% 99% 99% 97%  Weight:      Height:        Intake/Output Summary (Last 24 hours) at 03/07/2019 0857 Last data filed at 03/07/2019 0800 Gross per 24 hour  Intake 2713.92 ml  Output 775 ml  Net 1938.92 ml   Filed Weights   03/03/19 1328 03/03/19 2000  Weight: 86.2 kg 83.9 kg    Telemetry    SR with PVCs - Personally Reviewed  ECG    No new tracings - Personally Reviewed  Physical Exam   GEN: No acute distress.   Neck: No JVD. Cardiac: RRR, no murmurs, rubs, or gallops.  Respiratory: Diminished breath sounds bilaterally.  GI: Soft, nontender, non-distended.   MS: No edema; No deformity. Neuro:  Alert and oriented x 3; Nonfocal.  Psych: Normal affect.  Labs    Chemistry Recent Labs  Lab 03/03/19 1547 03/03/19 1547 03/03/19 1750 03/03/19 1936 03/05/19 0153 03/06/19 0152 03/07/19 0310  NA 123*   < > 123*   < > 128* 129* 130*  K 5.1   < > 5.4*   < > 3.5 3.3* 3.3*  CL 89*   < > 89*   < > 102 102 101  CO2 <7*   < >  8*   < > 15* 14* 16*  GLUCOSE 718*   < > 756*   < > 311* 182* 144*  BUN 37*   < > 43*   < > 65* 75* 84*  CREATININE 1.60*   < > 1.79*   < > 3.16* 4.22* 5.25*  CALCIUM 8.2*   < > 8.2*   < > 8.0* 8.2* 8.2*  PROT 6.6  --  7.5  --   --   --   --   ALBUMIN 2.9*  --  3.2*  --   --   --   --   AST 81*  --  136*  --   --   --   --   ALT 18  --  25  --   --   --   --   ALKPHOS 80  --  104  --   --   --   --   BILITOT 2.3*  --  2.4*  --   --   --   --   GFRNONAA 45*   < > 39*   < > 20* 14* 11*  GFRAA 52*   < > 46*   < > 23* 16* 12*  ANIONGAP NOT CALCULATED   < > 26*   < > 11 13 13    < > = values in this interval not displayed.     Hematology Recent Labs  Lab 03/05/19 0153 03/06/19 0152 03/07/19 0310  WBC 8.5 7.8 6.3  RBC 4.00* 4.02* 3.74*  HGB 11.1* 11.0* 10.2*  HCT 31.1* 31.0* 28.3*  MCV 77.8* 77.1* 75.7*  MCH 27.8 27.4 27.3  MCHC 35.7 35.5 36.0  RDW 17.1*  17.4* 17.5*  PLT 189 204 244    Cardiac EnzymesNo results for input(s): TROPONINI in the last 168 hours. No results for input(s): TROPIPOC in the last 168 hours.   BNP Recent Labs  Lab 03/03/19 1330  BNP 330.0*     DDimer No results for input(s): DDIMER in the last 168 hours.   Radiology    CT HEAD WO CONTRAST  Result Date: 03/05/2019 IMPRESSION: No acute abnormality and negative for metastatic disease on unenhanced CT. Atrophy and mild chronic microvascular ischemia. Electronically Signed   By: Franchot Gallo M.D.   On: 03/05/2019 15:28    Cardiac Studies   2D Echo3/01/2019: 1. Left ventricular ejection fraction, by estimation, is 30 to 35%. The  left ventricle has moderately decreased function. The left ventricle  demonstrates global hypokinesis. Left ventricular diastolic parameters are  indeterminate.  2. Right ventricular systolic function is normal. The right ventricular  size is normal. Tricuspid regurgitation signal is inadequate for assessing  PA pressure.  3. Left atrial size was mild to moderately dilated.  4. The mitral valve is normal in structure and function. Moderate mitral  valve regurgitation. No evidence of mitral stenosis.  5. The aortic valve is normal in structure and function. Aortic valve  regurgitation is trivial. Mild to moderate aortic valve  sclerosis/calcification is present, without any evidence of aortic  stenosis.  6. The inferior vena cava is normal in size with greater than 50%  respiratory variability, suggesting right atrial pressure of 3 mmHg.   Patient Profile     64 y.o. 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   Assessment & Plan  1. CAD s/p CABG with NSTEMI: -Markedly elevated high-sensitivity troponin greater than 27,000 in the setting of known CAD with A. fib with RVR and  volume overload -Remains on heparin drip as outlined below -Currently, not a good candidate for invasive ischemic evaluation given acute illness with severe comorbid conditions including acute renal failure -Consider addition of Plavix for medical management with possible discontinuation of aspirin prior to discharge if he is to be placed on [place] given his A. Fib -ASA -Crestor  2. PAF with RVR: -Converted to sinus rhythm 3/3 and has maintained since -BP soft on amiodarone gtt leading to discontinuation 3/3 -High risk for recurrent arrhythmia -Start PO amiodarone 400 mg bid x 1 week then 200 mg bid x 1 week then 200 mg daily thereafter -Decrease Lopressor to 12.5 mg bid given relative hypotension -Continue heparin gtt -Prior to discharge will need to evaluate his status for possible OAC given his comorbid conditions -CHADS2VASc at least 4 (CHF, HTN, DM, vascular disease)  2. HFrEF: -We will need close monitoring of his volume status as he has received a significant 1 of IV fluid in the setting of his renal injury -For now, remains on Lopressor given high risk of recurrent arrhythmia with recommendation to transition to Toprol-XL prior to discharge given his cardiomyopathy -Acute renal failure precludes addition of GDMT including ACE inhibitor/ARB/Entresto/spironolactone -Currently, not a good candidate for invasive ischemic evaluation given his new onset cardiomyopathy as outlined above -CHF education -Strict I's and O's  3. ARF: -Renal function continues to worsen -Cannot exclude ATN secondary to hypotension and hemodynamic instability -Nephrology is following  4. Stage IV lung cancer with malignant pleural effusions and COPD with prior tobacco use: -Prior thoracentesis x2 -Oncology following -Complete cessation of tobacco recommended  5.  HLD: -LDL of 81 this admission with goal being less than 70 -Continue PTA Crestor -In outpatient follow-up consider addition of Zetia  For  questions or updates, please contact [place] Please consult [email or web address] for contact info under Cardiology/STEMI.    Signed, Christell Faith, PA-C [facility] Pager: [phone] 03/07/2019, 8:57 AM

## 2019-03-07 NOTE — Consult Note (Addendum)
[place] for Heparin  Indication: atrial fibrillation- new onset  Patient Measurements: Height: 6' (182.9 cm) Weight: 184 lb 15.5 oz (83.9 kg) IBW/kg (Calculated) : 77.6 Heparin Dosing Weight: 86.2 kg   Vital Signs: Temp: 97.5 F (36.4 C) (03/04 1925) Temp Source: Oral (03/04 1925) BP: 113/62 (03/04 1925) Pulse Rate: 76 (03/04 1925)  Labs: Recent Labs    03/05/19 0153 03/05/19 0758 03/06/19 0152 03/06/19 0855 03/06/19 1449 03/07/19 0310 03/07/19 0633 03/07/19 1856  HGB 11.1*  --  11.0*  --   --  10.2*  --   --   HCT 31.1*  --  31.0*  --   --  28.3*  --   --   PLT 189  --  204  --   --  244  --   --   HEPARINUNFRC 0.33   < > 0.23*   < > 0.32  --  <0.10* 0.14*  CREATININE 3.16*  --  4.22*  --   --  5.25*  --   --    < > = values in this interval not displayed.    Estimated Creatinine Clearance: 15.8 mL/min (A) (by C-G formula based on SCr of 5.25 mg/dL (H)).   Medications:  Called RN to confirm whether pt has been on [facility] prior to admission. Nurse was not able to confirm as she was not in the patient's room. Per chart review, no AC prior to admission and confirmed with pharmacy technician who spoke to patient.   Heparin Course: 02/28 initiation: 1150 units/hr 02/28 2350 HL 0.26: inc to 1300 units/hr 03/01 0530 HL 0.31: no change 03/01 1059 HL 0.24: inc to 1450 units/hr 03/01 1849 HL 0.28: inc to 1550 units/hr 03/02 0153 HL 0.33: no change 03/02 0758 HL 0.28: inc to 1700 units/hr 03/02 1743 HL 0.30: no change 03/03 0152 HL 0.23: inc to 1950 units/hr 03/03 1449 HL 0.32: no change 03/04 0633 HL <0.10  Assessment: Craig Koch is a 64 y.o. male with a past medical history of anxiety, CAD, diabetes, and hyperlipidemia, hypertension, lung cancer, presents to the emergency department for shortness of breath. Pharmacy was consulted for heparin dosing in a patient with new onset atrial fibrillation. H&H trending down slightly,  platelets are stable  Goal of Therapy:  Heparin level 0.3-0.7 units/ml Monitor platelets by anticoagulation protocol: Yes   Plan:  3/4 1856 HL 0.14 subtherapeutic. Heparin 2000 unit bolus followed by increase in heparin drip to 2400 units/hr. HL 3/5 at 0500. CBC daily.  Pharmacy will continue to monitor and adjust per consult.   Tawnya Crook, PharmD 03/07/2019 7:48 PM

## 2019-03-07 NOTE — Progress Notes (Signed)
[place] visited pt. briefly as follow-up from prior visit; wife @ bedside, pt. lying down in bed.  [place] spoke w/pt. and wife re: pt.'s current condition; no needs expressed at this time.      03/07/19 1220  Clinical Encounter Type  Visited With Patient and family together  Visit Type Follow-up;Psychological support  Stress Factors  Patient Stress Factors Health changes  Family Stress Factors Major life changes;Health changes

## 2019-03-07 NOTE — Progress Notes (Signed)
Voided post foley dc'ed 2 times. Pt has been A/Q ox 4 all night. No s/s of ETOH withdraw. VSS. Drinking fluid ok without any problem. Pt requires assistance  with drinking fluid. And all ADL's, even holding urinals.

## 2019-03-07 NOTE — Progress Notes (Signed)
Pt transferred to 2A, no needs expressed at this time, wife hattie at bedside

## 2019-03-08 ENCOUNTER — Inpatient Hospital Stay: Payer: Medicare Other

## 2019-03-08 ENCOUNTER — Inpatient Hospital Stay: Payer: Medicare Other | Admitting: Oncology

## 2019-03-08 ENCOUNTER — Inpatient Hospital Stay: Payer: Medicare Other | Admitting: Hospice and Palliative Medicine

## 2019-03-08 DIAGNOSIS — D509 Iron deficiency anemia, unspecified: Secondary | ICD-10-CM

## 2019-03-08 DIAGNOSIS — M79601 Pain in right arm: Secondary | ICD-10-CM

## 2019-03-08 DIAGNOSIS — I5021 Acute systolic (congestive) heart failure: Secondary | ICD-10-CM

## 2019-03-08 DIAGNOSIS — E08649 Diabetes mellitus due to underlying condition with hypoglycemia without coma: Secondary | ICD-10-CM

## 2019-03-08 DIAGNOSIS — Z794 Long term (current) use of insulin: Secondary | ICD-10-CM

## 2019-03-08 LAB — GLUCOSE, CAPILLARY
Glucose-Capillary: 77 mg/dL (ref 70–99)
Glucose-Capillary: 62 mg/dL — ABNORMAL LOW (ref 70–99)
Glucose-Capillary: 72 mg/dL (ref 70–99)
Glucose-Capillary: 77 mg/dL (ref 70–99)
Glucose-Capillary: 44 mg/dL — CL (ref 70–99)
Glucose-Capillary: 45 mg/dL — ABNORMAL LOW (ref 70–99)
Glucose-Capillary: 49 mg/dL — ABNORMAL LOW (ref 70–99)
Glucose-Capillary: 54 mg/dL — ABNORMAL LOW (ref 70–99)
Glucose-Capillary: 111 mg/dL — ABNORMAL HIGH (ref 70–99)
Glucose-Capillary: 103 mg/dL — ABNORMAL HIGH (ref 70–99)
Glucose-Capillary: 113 mg/dL — ABNORMAL HIGH (ref 70–99)
Glucose-Capillary: 109 mg/dL — ABNORMAL HIGH (ref 70–99)

## 2019-03-08 LAB — BASIC METABOLIC PANEL
Anion gap: 14 (ref 5–15)
BUN: 92 mg/dL — ABNORMAL HIGH (ref 8–23)
CO2: 13 mmol/L — ABNORMAL LOW (ref 22–32)
Calcium: 7.9 mg/dL — ABNORMAL LOW (ref 8.9–10.3)
Chloride: 105 mmol/L (ref 98–111)
Creatinine, Ser: 5.94 mg/dL — ABNORMAL HIGH (ref 0.61–1.24)
GFR calc Af Amer: 11 mL/min — ABNORMAL LOW (ref >60)
GFR calc non Af Amer: 9 mL/min — ABNORMAL LOW (ref >60)
Glucose, Bld: 59 mg/dL — ABNORMAL LOW (ref 70–99)
Potassium: 3.5 mmol/L (ref 3.5–5.1)
Sodium: 132 mmol/L — ABNORMAL LOW (ref 135–145)

## 2019-03-08 LAB — CBC
HCT: 26.2 % — ABNORMAL LOW (ref 39.0–52.0)
Hemoglobin: 9.2 g/dL — ABNORMAL LOW (ref 13.0–17.0)
MCH: 27.1 pg (ref 26.0–34.0)
MCHC: 35.1 g/dL (ref 30.0–36.0)
MCV: 77.1 fL — ABNORMAL LOW (ref 80.0–100.0)
Platelets: 256 10*3/uL (ref 150–400)
RBC: 3.4 MIL/uL — ABNORMAL LOW (ref 4.22–5.81)
RDW: 18.4 % — ABNORMAL HIGH (ref 11.5–15.5)
WBC: 7.9 10*3/uL (ref 4.0–10.5)
nRBC: 0 % (ref 0.0–0.2)

## 2019-03-08 LAB — HEPARIN LEVEL (UNFRACTIONATED)
Heparin Unfractionated: <0.1 IU/mL — ABNORMAL LOW (ref 0.30–0.70)
Heparin Unfractionated: 1.26 IU/mL — ABNORMAL HIGH (ref 0.30–0.70)

## 2019-03-08 LAB — ANTITHROMBIN III: AntiThromb III Func: 51 % — ABNORMAL LOW (ref 75–120)

## 2019-03-08 LAB — VANCOMYCIN, RANDOM: Vancomycin Rm: 20

## 2019-03-08 MED ORDER — HEPARIN BOLUS VIA INFUSION
2500.0000 [IU] | Freq: Once | INTRAVENOUS | Status: AC
  Administered 2019-03-08: 2500 [IU] via INTRAVENOUS
  Filled 2019-03-08: qty 2500

## 2019-03-08 MED ORDER — MENTHOL 3 MG MT LOZG
1.0000 | LOZENGE | OROMUCOSAL | Status: DC | PRN
  Filled 2019-03-08: qty 9

## 2019-03-08 MED ORDER — SODIUM BICARBONATE 650 MG PO TABS
1300.0000 mg | ORAL_TABLET | Freq: Two times a day (BID) | ORAL | Status: DC
  Administered 2019-03-08 – 2019-03-18 (×20): 1300 mg via ORAL
  Filled 2019-03-08 (×20): qty 2

## 2019-03-08 MED ORDER — INSULIN GLARGINE 100 UNIT/ML ~~LOC~~ SOLN
32.0000 [IU] | Freq: Every day | SUBCUTANEOUS | Status: DC
  Filled 2019-03-08: qty 0.32

## 2019-03-08 MED ORDER — HEPARIN (PORCINE) 25000 UT/250ML-% IV SOLN
2250.0000 [IU]/h | INTRAVENOUS | Status: DC
  Administered 2019-03-08: 2400 [IU]/h via INTRAVENOUS
  Filled 2019-03-08: qty 250

## 2019-03-08 MED ORDER — MAGNESIUM HYDROXIDE 400 MG/5ML PO SUSP: 30.0000 mL | Freq: Once | ORAL | Status: DC

## 2019-03-08 MED ORDER — DEXTROSE 50 % IV SOLN
25.0000 mL | Freq: Once | INTRAVENOUS | Status: AC
  Administered 2019-03-08: 25 mL via INTRAVENOUS
  Filled 2019-03-08: qty 50

## 2019-03-08 MED ORDER — POLYETHYLENE GLYCOL 3350 17 G PO PACK
17.0000 g | PACK | Freq: Every day | ORAL | Status: DC
  Administered 2019-03-08 – 2019-03-18 (×4): 17 g via ORAL
  Filled 2019-03-08 (×10): qty 1

## 2019-03-08 MED ORDER — DEXTROSE-NACL 5-0.9 % IV SOLN: INTRAVENOUS | Status: DC

## 2019-03-08 NOTE — Progress Notes (Signed)
PROGRESS NOTE                                                                                                                                                                                                             Patient Demographics:    Craig Koch, is a 64 y.o. male, DOB - May 27, 1955, ALP:[id number]  Admit date - 03/03/2019   Admitting Physician Ivor Costa, MD  Outpatient Primary MD for the patient is Tonia Ghent, MD  LOS - 5  Outpatient Specialists: Oncology  Chief Complaint  Patient presents with  . Shortness of Breath       Brief Narrative 64 year old male with hypertension, hyperlipidemia, type 2 diabetes mellitus, anxiety depression, stage IV lung cancer, CAD with history of CABG, tobacco and alcohol use who presented with generalized weakness and shortness of breath.  In the ED he was found to be in DKA, AKI with creatinine of 1.6, new onset A. fib with RVR.  Required BiPAP and admission to stepdown unit. Hospital course prolonged due to persistent encephalopathy, rapid A. fib,?  Occult bacteremia and ATN.   Subjective:   Much oriented today but having episodes of hypoglycemia with blood sugar dropping down to the 50s overnight and this morning.  Ordered D50.  Noted for poor urinary output with bladder scan showing up to 300 cc.  Worsening renal function.   Assessment  & Plan :    Principal Problem: New onset atrial fibrillation with RVR ([place]) Now converted to normal sinus.   Cardiology recommends to continue amiodarone 40 mg twice daily for 5 days and then reduce to 200 mg twice daily. Continue IV heparin.  Given his poor renal function cannot transition to Eliquis or Xarelto yet. Metoprolol dose reduced to 12.5 mg twice daily given low blood pressure.    Active problems Acute tubular necrosis Suspect prerenal with hypovolemia from ketoacidosis.. Creatinine continues to worsen however he is  maintaining urine output and not uremic.  May need dialysis per renal.   Acute metabolic encephalopathy Now resolved.  On empiric antibiotic for possible underlying infection.  Will discontinue antibiotics (especially vancomycin given his ATN).  Acute systolic CHF (EF 35-32%) NSTEMI Presented with markedly elevated troponin and suspect NSTEMI with respiratory failure and DKA. Continue IV heparin, beta-blocker and statin.  Metoprolol dose reduced.  Further ischemic evaluation upon resolution  of acute issues.  Uncontrolled type II diabetes mellitus with DKA DKA resolved on insulin drip.  Patient hyperglycemic, likely due to increasing his Lantus while his renal function continues to worsen.  Given D50 and placed on D5.  Hold off on Lantus today.  Monitor closely.  Alcohol abuse Encephalopathy could be contributed by withdrawal symptoms earlier.  Resolved.  Tobacco abuse Nicotine patch.   Stage IV lung adenocarcinoma Dr. Tasia Catchings following him in the hospital.  Recommend to hold dabrafenib and trametinib.  Hypothermia Secondary to DKA.  Resolved    Code Status : DNR (being followed by palliative care)  Family Communication  : Wife updated on the phone  Disposition Plan  : Needs SNF once renal function improved and IV heparin transition to oral anticoagulant.  Barriers For Discharge : Ongoing several medical issues.  (Need for IV heparin, progressive ATN, hyperglycemia)  Consults  : Cardiology, nephrology, critical care, oncology  Procedures  : 2D echo, head CT  DVT Prophylaxis  : IV heparin  Lab Results  Component Value Date   PLT 256 03/08/2019    Antibiotics  :    Anti-infectives (From admission, onward)   Start     Dose/Rate Route Frequency Ordered Stop   03/07/19 1200  vancomycin (VANCOCIN) IVPB 1000 mg/200 mL premix     1,000 mg 200 mL/hr over 60 Minutes Intravenous  Once 03/07/19 0801 03/07/19 2024   03/06/19 2200  piperacillin-tazobactam (ZOSYN) IVPB 3.375 g      3.375 g 12.5 mL/hr over 240 Minutes Intravenous Every 12 hours 03/06/19 0858     03/05/19 1420  vancomycin variable dose per unstable renal function (pharmacist dosing)      Does not apply See admin instructions 03/05/19 1420     03/05/19 1200  vancomycin (VANCOREADY) IVPB 1500 mg/300 mL     1,500 mg 150 mL/hr over 120 Minutes Intravenous  Once 03/05/19 0944 03/06/19 2324   03/05/19 1100  piperacillin-tazobactam (ZOSYN) IVPB 3.375 g  Status:  Discontinued     3.375 g 12.5 mL/hr over 240 Minutes Intravenous Every 8 hours 03/05/19 0944 03/06/19 0858        Objective:   Vitals:   03/08/19 0639 03/08/19 0725 03/08/19 0755 03/08/19 1148  BP:   115/66 102/70  Pulse:   68 69  Resp:   16 18  Temp: 97.7 F (36.5 C)  98 F (36.7 C) 97.9 F (36.6 C)  TempSrc: Oral     SpO2:  98% 99% 99%  Weight:    86.5 kg  Height:        Wt Readings from Last 3 Encounters:  03/08/19 86.5 kg  02/14/19 86.6 kg  02/08/19 86.9 kg     Intake/Output Summary (Last 24 hours) at 03/08/2019 1406 Last data filed at 03/08/2019 0521 Gross per 24 hour  Intake --  Output 550 ml  Net -550 ml   Physical exam Patient much oriented today, not in distress HEENT: Moist mucosa, supple neck Chest: Clear CVs: Normal S1-S2 GI: Soft, nondistended, nontender Musculoskeletal: Warm, no edema CNs: Alert and oriented x3     Data Review:    CBC Recent Labs  Lab 03/03/19 1330 03/03/19 2359 03/04/19 1059 03/05/19 0153 03/06/19 0152 03/07/19 0310 03/08/19 0707  WBC 9.4   < > 8.5 8.5 7.8 6.3 7.9  HGB 13.4   < > 12.2* 11.1* 11.0* 10.2* 9.2*  HCT 42.9   < > 34.2* 31.1* 31.0* 28.3* 26.2*  PLT 232   < >  210 189 204 244 256  MCV 87.4   < > 77.0* 77.8* 77.1* 75.7* 77.1*  MCH 27.3   < > 27.5 27.8 27.4 27.3 27.1  MCHC 31.2   < > 35.7 35.7 35.5 36.0 35.1  RDW 16.8*   < > 16.4* 17.1* 17.4* 17.5* 18.4*  LYMPHSABS 0.8  --   --   --   --   --   --   MONOABS 0.7  --   --   --   --   --   --   EOSABS 0.0  --   --   --    --   --   --   BASOSABS 0.1  --   --   --   --   --   --    < > = values in this interval not displayed.    Chemistries  Recent Labs  Lab 03/03/19 1547 03/03/19 1547 03/03/19 1750 03/03/19 1936 03/04/19 1059 03/05/19 0153 03/06/19 0152 03/07/19 0310 03/08/19 0707  NA 123*   < > 123*   < > 127* 128* 129* 130* 132*  K 5.1   < > 5.4*   < > 3.7 3.5 3.3* 3.3* 3.5  CL 89*   < > 89*   < > 102 102 102 101 105  CO2 <7*   < > 8*   < > 14* 15* 14* 16* 13*  GLUCOSE 718*   < > 756*   < > 134* 311* 182* 144* 59*  BUN 37*   < > 43*   < > 53* 65* 75* 84* 92*  CREATININE 1.60*   < > 1.79*   < > 2.13* 3.16* 4.22* 5.25* 5.94*  CALCIUM 8.2*   < > 8.2*   < > 8.0* 8.0* 8.2* 8.2* 7.9*  MG  --   --  2.3  --   --  1.9 1.9  --   --   AST 81*  --  136*  --   --   --   --   --   --   ALT 18  --  25  --   --   --   --   --   --   ALKPHOS 80  --  104  --   --   --   --   --   --   BILITOT 2.3*  --  2.4*  --   --   --   --   --   --    < > = values in this interval not displayed.   ------------------------------------------------------------------------------------------------------------------ No results for input(s): CHOL, HDL, LDLCALC, TRIG, CHOLHDL, LDLDIRECT in the last 72 hours.  Lab Results  Component Value Date   HGBA1C 11.3 (H) 03/04/2019   ------------------------------------------------------------------------------------------------------------------ No results for input(s): TSH, T4TOTAL, T3FREE, THYROIDAB in the last 72 hours.  Invalid input(s): FREET3 ------------------------------------------------------------------------------------------------------------------ No results for input(s): VITAMINB12, FOLATE, FERRITIN, TIBC, IRON, RETICCTPCT in the last 72 hours.  Coagulation profile Recent Labs  Lab 03/03/19 1750  INR 1.2    No results for input(s): DDIMER in the last 72 hours.  Cardiac Enzymes No results for input(s): CKMB, TROPONINI, MYOGLOBIN in the last 168  hours.  Invalid input(s): CK ------------------------------------------------------------------------------------------------------------------    Component Value Date/Time   BNP 330.0 (H) 03/03/2019 1330    Inpatient Medications  Scheduled Meds: . amiodarone  400 mg Oral BID  . aspirin EC  81 mg Oral Daily  . chlorhexidine  15 mL Mouth Rinse BID  . Chlorhexidine Gluconate Cloth  6 each Topical Daily  . dextromethorphan-guaiFENesin  1 tablet Oral BID  . dronabinol  5 mg Oral BID AC  . DULoxetine  30 mg Oral Daily  . insulin aspart  0-15 Units Subcutaneous Q4H  . ipratropium-albuterol  3 mL Nebulization TID  . mouth rinse  15 mL Mouth Rinse q12n4p  . metoprolol tartrate  12.5 mg Oral BID  . multivitamin with minerals  1 tablet Oral Daily  . nicotine  21 mg Transdermal Daily  . polyethylene glycol  17 g Oral Daily  . Ensure Max Protein  11 oz Oral BID BM  . rosuvastatin  10 mg Oral Daily  . sodium bicarbonate  1,300 mg Oral BID  . vancomycin variable dose per unstable renal function (pharmacist dosing)   Does not apply See admin instructions   Continuous Infusions: . dextrose 5 % and 0.9% NaCl 75 mL/hr at 03/08/19 0914  . diltiazem (CARDIZEM) infusion 10 mg/hr (03/03/19 1815)  . heparin 2,600 Units/hr (03/08/19 1026)  . piperacillin-tazobactam (ZOSYN)  IV 3.375 g (03/08/19 1022)  . small volume/piggyback builder Stopped (03/07/19 2024)   PRN Meds:.acetaminophen, albuterol, dextrose, menthol-cetylpyridinium, ondansetron (ZOFRAN) IV, pneumococcal 23 valent vaccine  Micro Results Recent Results (from the past 240 hour(s))  Respiratory Panel by RT PCR (Flu A&B, Covid) - Nasopharyngeal Swab     Status: None   Collection Time: 03/03/19  3:31 PM   Specimen: Nasopharyngeal Swab  Result Value Ref Range Status   SARS Coronavirus 2 by RT PCR NEGATIVE NEGATIVE Final    Comment: (NOTE) SARS-CoV-2 target nucleic acids are NOT DETECTED. The SARS-CoV-2 RNA is generally detectable in  upper respiratoy specimens during the acute phase of infection. The lowest concentration of SARS-CoV-2 viral copies this assay can detect is 131 copies/mL. A negative result does not preclude SARS-Cov-2 infection and should not be used as the sole basis for treatment or other patient management decisions. A negative result may occur with  improper specimen collection/handling, submission of specimen other than nasopharyngeal swab, presence of viral mutation(s) within the areas targeted by this assay, and inadequate number of viral copies (<131 copies/mL). A negative result must be combined with clinical observations, patient history, and epidemiological information. The expected result is Negative. Fact Sheet for Patients:  PinkCheek.be Fact Sheet for Healthcare Providers:  GravelBags.it This test is not yet ap proved or cleared by the [place] FDA and  has been authorized for detection and/or diagnosis of SARS-CoV-2 by FDA under an Emergency Use Authorization (EUA). This EUA will remain  in effect (meaning this test can be used) for the duration of the COVID-19 declaration under Section 564(b)(1) of the Act, 21 U.S.C. section 360bbb-3(b)(1), unless the authorization is terminated or revoked sooner.    Influenza A by PCR NEGATIVE NEGATIVE Final   Influenza B by PCR NEGATIVE NEGATIVE Final    Comment: (NOTE) The Xpert Xpress SARS-CoV-2/FLU/RSV assay is intended as an aid in  the diagnosis of influenza from Nasopharyngeal swab specimens and  should not be used as a sole basis for treatment. Nasal washings and  aspirates are unacceptable for Xpert Xpress SARS-CoV-2/FLU/RSV  testing. Fact Sheet for Patients: PinkCheek.be Fact Sheet for Healthcare Providers: GravelBags.it This test is not yet approved or cleared by the [place] FDA and  has been authorized for  detection and/or diagnosis of SARS-CoV-2 by  FDA under an Emergency Use Authorization (EUA). This EUA will remain  in effect (meaning  this test can be used) for the duration of the  Covid-19 declaration under Section 564(b)(1) of the Act, 21  U.S.C. section 360bbb-3(b)(1), unless the authorization is  terminated or revoked. Performed at [facility], [address] [place], [place] [address]   MRSA PCR Screening     Status: None   Collection Time: 03/03/19  7:48 PM   Specimen: Nasopharyngeal  Result Value Ref Range Status   MRSA by PCR NEGATIVE NEGATIVE Final    Comment:        The GeneXpert MRSA Assay (FDA approved for NASAL specimens only), is one component of a comprehensive MRSA colonization surveillance program. It is not intended to diagnose MRSA infection nor to guide or monitor treatment for MRSA infections. Performed at [facility], [address] [place], [place] [address]   CULTURE, BLOOD (ROUTINE X 2) w Reflex to ID Panel     Status: None (Preliminary result)   Collection Time: 03/05/19 10:16 AM   Specimen: BLOOD  Result Value Ref Range Status   Specimen Description BLOOD LEFT ANTECUBITAL  Final   Special Requests   Final    BOTTLES DRAWN AEROBIC AND ANAEROBIC Blood Culture adequate volume   Culture   Final    NO GROWTH 3 DAYS Performed at [facility], [address] [place], [place] [address]    Report Status PENDING  Incomplete  CULTURE, BLOOD (ROUTINE X 2) w Reflex to ID Panel     Status: None (Preliminary result)   Collection Time: 03/05/19 10:22 AM   Specimen: BLOOD  Result Value Ref Range Status   Specimen Description BLOOD BLOOD LEFT HAND  Final   Special Requests   Final    BOTTLES DRAWN AEROBIC ONLY Blood Culture adequate volume   Culture   Final    NO GROWTH 3 DAYS Performed at [facility], [address], [place], [place] [address]    Report Status PENDING  Incomplete    Radiology Reports CT HEAD  WO CONTRAST  Result Date: 03/05/2019 CLINICAL DATA:  Metastatic disease evaluation.  Lung cancer. EXAM: CT HEAD WITHOUT CONTRAST TECHNIQUE: Contiguous axial images were obtained from the base of the skull through the vertex without intravenous contrast. COMPARISON:  MRI head 09/20/2018 FINDINGS: Brain: Mild atrophy. Mild hypodensity in the white matter unchanged from the prior MRI. No acute infarct, hemorrhage, mass. No edema or midline shift. Vascular: Negative for hyperdense vessel Skull: Negative skull. Sinuses/Orbits: Mucosal edema and bony thickening right maxillary sinus. Chronic nasal bone fracture. Left cataract extraction. Other: None IMPRESSION: No acute abnormality and negative for metastatic disease on unenhanced CT. Atrophy and mild chronic microvascular ischemia. Electronically Signed   By: Franchot Gallo M.D.   On: 03/05/2019 15:28   US RENAL  Result Date: 03/04/2019 CLINICAL DATA:  Acute kidney injury EXAM: RENAL / URINARY TRACT ULTRASOUND COMPLETE COMPARISON:  CT 01/22/2019 FINDINGS: Right Kidney: Renal measurements: 11.4 x 6 x 6 cm = volume: 215 mL . Echogenicity within normal limits. No mass or hydronephrosis visualized. Left Kidney: Renal measurements: 11.8 x 7.3 x 6.5 cm = volume: 291.8 mL. Echogenicity within normal limits. No mass or hydronephrosis visualized. Bladder: Appears normal for degree of bladder distention. Other: Incidental note made of gallstones and small effusions. Heterogenous prostate. IMPRESSION: Normal ultrasound appearance of the kidneys Electronically Signed   By: Donavan Foil M.D.   On: 03/04/2019 15:37   DG Chest Portable 1 View  Result Date: 03/03/2019 CLINICAL DATA:  Shortness of breath,  since Monday worsening this morning around 1 a.m. EXAM: PORTABLE CHEST 1 VIEW COMPARISON:  12/19/2018 FINDINGS: Cardiomediastinal contours are stable following median sternotomy with persistent left-sided pleural effusion and pleural-parenchymal scarring. No new areas of  consolidation or evidence of right-sided pleural effusion. No signs of acute bone process. IMPRESSION: Persistent left-sided pleural effusion and pleural-parenchymal scarring. Electronically Signed   By: Zetta Bills M.D.   On: 03/03/2019 13:59   ECHOCARDIOGRAM COMPLETE  Result Date: 03/04/2019    ECHOCARDIOGRAM REPORT   Patient Name:   COMPTON BRIGANCE Date of Exam: 03/04/2019 Medical Rec #:  [id number]       Height:       72.0 in Accession #:    [id number]      Weight:       185.0 lb Date of Birth:  Jun 28, 1955       BSA:          2.061 m Patient Age:    71 years        BP:           125/72 mmHg Patient Gender: M               HR:           119 bpm. Exam Location:  [facility] Procedure: 2D Echo, Color Doppler and Cardiac Doppler Indications:     I21.4 NSTEMI  History:         Patient has prior history of Echocardiogram examinations, most                  recent 08/24/2018. CAD, Prior CABG; Risk Factors:Hypertension,                  Dyslipidemia and Diabetes. Lung Cancer.  Sonographer:     Charmayne Sheer RDCS (AE) Referring Phys:  [place] Phys: Kathlyn Sacramento MD IMPRESSIONS  1. Left ventricular ejection fraction, by estimation, is 30 to 35%. The left ventricle has moderately decreased function. The left ventricle demonstrates global hypokinesis. Left ventricular diastolic parameters are indeterminate.  2. Right ventricular systolic function is normal. The right ventricular size is normal. Tricuspid regurgitation signal is inadequate for assessing PA pressure.  3. Left atrial size was mild to moderately dilated.  4. The mitral valve is normal in structure and function. Moderate mitral valve regurgitation. No evidence of mitral stenosis.  5. The aortic valve is normal in structure and function. Aortic valve regurgitation is trivial. Mild to moderate aortic valve sclerosis/calcification is present, without any evidence of aortic stenosis.  6. The inferior vena cava is normal in size with greater than  50% respiratory variability, suggesting right atrial pressure of 3 mmHg. FINDINGS  Left Ventricle: Left ventricular ejection fraction, by estimation, is 30 to 35%. The left ventricle has moderately decreased function. The left ventricle demonstrates global hypokinesis. The left ventricular internal cavity size was normal in size. There is no left ventricular hypertrophy. Left ventricular diastolic parameters are indeterminate. Right Ventricle: The right ventricular size is normal. No increase in right ventricular wall thickness. Right ventricular systolic function is normal. Tricuspid regurgitation signal is inadequate for assessing PA pressure. Left Atrium: Left atrial size was mild to moderately dilated. Right Atrium: Right atrial size was normal in size. Pericardium: There is no evidence of pericardial effusion. Mitral Valve: The mitral valve is normal in structure and function. Normal mobility of the mitral valve leaflets. Moderate mitral valve regurgitation. No evidence of mitral valve stenosis. MV peak gradient, 7.5  mmHg. The mean mitral valve gradient is 4.0  mmHg. Tricuspid Valve: The tricuspid valve is normal in structure. Tricuspid valve regurgitation is not demonstrated. No evidence of tricuspid stenosis. Aortic Valve: The aortic valve is normal in structure and function. Aortic valve regurgitation is trivial. Aortic regurgitation PHT measures 281 msec. Mild to moderate aortic valve sclerosis/calcification is present, without any evidence of aortic stenosis. Aortic valve mean gradient measures 5.0 mmHg. Aortic valve peak gradient measures 8.5 mmHg. Aortic valve area, by VTI measures 2.53 cm. Pulmonic Valve: The pulmonic valve was normal in structure. Pulmonic valve regurgitation is not visualized. No evidence of pulmonic stenosis. Aorta: The aortic root is normal in size and structure. Venous: The inferior vena cava is normal in size with greater than 50% respiratory variability, suggesting right atrial  pressure of 3 mmHg. IAS/Shunts: No atrial level shunt detected by color flow Doppler.  LEFT VENTRICLE PLAX 2D LVIDd:         5.21 cm  Diastology LVIDs:         4.21 cm  LV e' lateral:   7.83 cm/s LV PW:         0.84 cm  LV E/e' lateral: 13.4 LV IVS:        0.89 cm  LV e' medial:    7.94 cm/s LVOT diam:     2.40 cm  LV E/e' medial:  13.2 LV SV:         54 LV SV Index:   26 LVOT Area:     4.52 cm  RIGHT VENTRICLE RV Basal diam:  3.08 cm LEFT ATRIUM             Index       RIGHT ATRIUM           Index LA diam:        4.70 cm 2.28 cm/m  RA Area:     14.10 cm LA Vol (A2C):   62.0 ml 30.08 ml/m RA Volume:   35.20 ml  17.08 ml/m LA Vol (A4C):   68.6 ml 33.29 ml/m LA Biplane Vol: 65.3 ml 31.68 ml/m  AORTIC VALVE                    PULMONIC VALVE AV Area (Vmax):    2.95 cm     PV Vmax:       1.10 m/s AV Area (Vmean):   2.86 cm     PV Vmean:      68.400 cm/s AV Area (VTI):     2.53 cm     PV VTI:        0.146 m AV Vmax:           146.00 cm/s  PV Peak grad:  4.8 mmHg AV Vmean:          103.000 cm/s PV Mean grad:  2.0 mmHg AV VTI:            0.213 m AV Peak Grad:      8.5 mmHg AV Mean Grad:      5.0 mmHg LVOT Vmax:         95.30 cm/s LVOT Vmean:        65.200 cm/s LVOT VTI:          0.119 m LVOT/AV VTI ratio: 0.56 AI PHT:            281 msec  AORTA Ao Root diam: 3.00 cm MITRAL VALVE MV Area (PHT): 3.92 cm  SHUNTS MV Peak grad:  7.5 mmHg     Systemic VTI:  0.12 m MV Mean grad:  4.0 mmHg     Systemic Diam: 2.40 cm MV Vmax:       1.37 m/s MV Vmean:      92.7 cm/s MV Decel Time: 194 msec MV E velocity: 104.80 cm/s Kathlyn Sacramento MD Electronically signed by Kathlyn Sacramento MD Signature Date/Time: 03/04/2019/2:46:43 PM    Final     Time Spent in minutes 35   Martie Fulgham M.D on 03/08/2019 at 2:06 PM  Between 7am to 7pm - Pager - [phone]  After 7pm go to [email or web address] - password [facility]  [facility] -  Office  [phone]

## 2019-03-08 NOTE — Progress Notes (Signed)
Dr. Clementeen Graham aware of low CBG trends last night and this morning. MD has adjusted orders

## 2019-03-08 NOTE — Progress Notes (Signed)
Patient with small amount of bright red blood on bed when we got him to [facility] to attempt BM. Was able to pass 2 small, hard pieces of stool, dark brown. However once we got him back to bed I had him role over so I could look for hemorroids and didn't see any, but it appears that he some small abrasions/perforations in midline of perineum between scrotal sac and anus. Very small area but when I applied a little pressure to wipe the dried blood it started oozing a little  Reports to Dr. Clementeen Graham. No new orders at this time. Will monitor and pass on to night shift nurse.

## 2019-03-08 NOTE — Care Management Important Message (Signed)
Important Message  Patient Details  Name: Craig Koch MRN: [id number] Date of Birth: 1955-08-29   Medicare Important Message Given:  Yes     [name] 03/08/2019, 1:11 PM

## 2019-03-08 NOTE — Progress Notes (Signed)
Progress Note  Patient Name: Craig Koch Date of Encounter: 03/08/2019  Primary Cardiologist: Rockey Situ  Subjective   Transferred out of the ICU to telemetry. Continues to feel weak, though a little better than yesterday. SOB improving. No chest pain or palpitations. Appetite remains low. He is maintaining sinus rhythm with PVCs. Tolerating PO amiodarone and lower dose metoprolol. Renal function continues to worsen. Potassium 3.5 this morning. Remains on heparin gtt with HGB 10.2-->9.2.   Inpatient Medications    Scheduled Meds: . amiodarone  400 mg Oral BID  . aspirin EC  81 mg Oral Daily  . chlorhexidine  15 mL Mouth Rinse BID  . Chlorhexidine Gluconate Cloth  6 each Topical Daily  . dextromethorphan-guaiFENesin  1 tablet Oral BID  . dronabinol  5 mg Oral BID AC  . DULoxetine  30 mg Oral Daily  . insulin aspart  0-15 Units Subcutaneous Q4H  . ipratropium-albuterol  3 mL Nebulization TID  . mouth rinse  15 mL Mouth Rinse q12n4p  . metoprolol tartrate  12.5 mg Oral BID  . multivitamin with minerals  1 tablet Oral Daily  . nicotine  21 mg Transdermal Daily  . polyethylene glycol  17 g Oral Daily  . Ensure Max Protein  11 oz Oral BID BM  . rosuvastatin  10 mg Oral Daily  . vancomycin variable dose per unstable renal function (pharmacist dosing)   Does not apply See admin instructions   Continuous Infusions: . dextrose 5 % and 0.9% NaCl 75 mL/hr at 03/08/19 0914  . diltiazem (CARDIZEM) infusion 10 mg/hr (03/03/19 1815)  . heparin 2,400 Units/hr (03/08/19 0350)  . piperacillin-tazobactam (ZOSYN)  IV 3.375 g (03/07/19 2015)  . small volume/piggyback builder Stopped (03/07/19 2024)   PRN Meds: acetaminophen, albuterol, dextrose, ondansetron (ZOFRAN) IV, pneumococcal 23 valent vaccine   Vital Signs    Vitals:   03/08/19 0334 03/08/19 0639 03/08/19 0725 03/08/19 0755  BP: 108/62   115/66  Pulse: 70   68  Resp: 19   16  Temp:  97.7 F (36.5 C)  98 F (36.7 C)  TempSrc:   Oral    SpO2: 100%  98% 99%  Weight:      Height:        Intake/Output Summary (Last 24 hours) at 03/08/2019 0934 Last data filed at 03/08/2019 0521 Gross per 24 hour  Intake 562.8 ml  Output 550 ml  Net 12.8 ml   Filed Weights   03/03/19 1328 03/03/19 2000  Weight: 86.2 kg 83.9 kg    Telemetry    SR with PVCs - Personally Reviewed  ECG    No new tracings - Personally Reviewed  Physical Exam   GEN: Chronically ill and frail appearing; No acute distress.   Neck: No JVD. Cardiac: RRR, no murmurs, rubs, or gallops.  Respiratory: Diminished, coarse breath sounds bilaterally.  GI: Soft, nontender, non-distended.   MS: No edema; No deformity. Neuro:  Alert and oriented x 3; Nonfocal.  Psych: Normal affect.  Labs    Chemistry Recent Labs  Lab 03/03/19 1547 03/03/19 1547 03/03/19 1750 03/03/19 1936 03/06/19 0152 03/07/19 0310 03/08/19 0707  NA 123*   < > 123*   < > 129* 130* 132*  K 5.1   < > 5.4*   < > 3.3* 3.3* 3.5  CL 89*   < > 89*   < > 102 101 105  CO2 <7*   < > 8*   < > 14* 16* 13*  GLUCOSE 718*   < > 756*   < > 182* 144* 59*  BUN 37*   < > 43*   < > 75* 84* 92*  CREATININE 1.60*   < > 1.79*   < > 4.22* 5.25* 5.94*  CALCIUM 8.2*   < > 8.2*   < > 8.2* 8.2* 7.9*  PROT 6.6  --  7.5  --   --   --   --   ALBUMIN 2.9*  --  3.2*  --   --   --   --   AST 81*  --  136*  --   --   --   --   ALT 18  --  25  --   --   --   --   ALKPHOS 80  --  104  --   --   --   --   BILITOT 2.3*  --  2.4*  --   --   --   --   GFRNONAA 45*   < > 39*   < > 14* 11* 9*  GFRAA 52*   < > 46*   < > 16* 12* 11*  ANIONGAP NOT CALCULATED   < > 26*   < > 13 13 14    < > = values in this interval not displayed.     Hematology Recent Labs  Lab 03/06/19 0152 03/07/19 0310 03/08/19 0707  WBC 7.8 6.3 7.9  RBC 4.02* 3.74* 3.40*  HGB 11.0* 10.2* 9.2*  HCT 31.0* 28.3* 26.2*  MCV 77.1* 75.7* 77.1*  MCH 27.4 27.3 27.1  MCHC 35.5 36.0 35.1  RDW 17.4* 17.5* 18.4*  PLT 204 244 256     Cardiac EnzymesNo results for input(s): TROPONINI in the last 168 hours. No results for input(s): TROPIPOC in the last 168 hours.   BNP Recent Labs  Lab 03/03/19 1330  BNP 330.0*     DDimer No results for input(s): DDIMER in the last 168 hours.   Radiology    CT HEAD WO CONTRAST  Result Date: 03/05/2019 IMPRESSION: No acute abnormality and negative for metastatic disease on unenhanced CT. Atrophy and mild chronic microvascular ischemia. Electronically Signed   By: Franchot Gallo M.D.   On: 03/05/2019 15:28    Cardiac Studies   2D Echo3/01/2019: 1. Left ventricular ejection fraction, by estimation, is 30 to 35%. The  left ventricle has moderately decreased function. The left ventricle  demonstrates global hypokinesis. Left ventricular diastolic parameters are  indeterminate.  2. Right ventricular systolic function is normal. The right ventricular  size is normal. Tricuspid regurgitation signal is inadequate for assessing  PA pressure.  3. Left atrial size was mild to moderately dilated.  4. The mitral valve is normal in structure and function. Moderate mitral  valve regurgitation. No evidence of mitral stenosis.  5. The aortic valve is normal in structure and function. Aortic valve  regurgitation is trivial. Mild to moderate aortic valve  sclerosis/calcification is present, without any evidence of aortic  stenosis.  6. The inferior vena cava is normal in size with greater than 50%  respiratory variability, suggesting right atrial pressure of 3 mmHg.   Patient Profile     64 y.o. 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   Assessment & Plan    1. CAD s/p CABG with NSTEMI: -  Markedly elevated high-sensitivity troponin greater than 27,000 in the setting of known CAD with A. fib with RVR and volume overload -Remains on heparin  drip as outlined below -Currently, not a good candidate for invasive ischemic evaluation given acute illness with severe comorbid conditions including acute renal failure -Consider addition of Plavix for medical management with possible discontinuation of aspirin prior to discharge if he is to be placed on [place] given his A. Fib if HGB allows -ASA -Crestor  2. PAF with RVR: -Converted to sinus rhythm 3/3 and has maintained since -BP soft on amiodarone gtt leading to discontinuation 3/3 -High risk for recurrent arrhythmia -Continue PO amiodarone 400 mg bid x 1 week, then 200 mg bid x 1 week, then 200 mg daily thereafter -Continue lower dose Lopressor to 12.5 mg bid given relative hypotension -Continue heparin gtt for now given ARF -Prior to discharge will need to evaluate his status for possible OAC given his comorbid conditions -CHADS2VASc at least 4 (CHF, HTN, DM, vascular disease)  2. HFrEF: -We will need close monitoring of his volume status as he has received a significant amount of IV fluid in the setting of his renal injury -For now, remains on Lopressor given high risk of recurrent arrhythmia with recommendation to transition to Toprol-XL prior to discharge given his cardiomyopathy -Acute renal failure precludes addition of GDMT including ACE inhibitor/ARB/Entresto/spironolactone -Currently, not a good candidate for invasive ischemic evaluation given his new onset cardiomyopathy as outlined above -CHF education -Strict I's and O's  3. ARF: -Renal function continues to worsen -Cannot exclude ATN secondary to hypotension and hemodynamic instability -Nephrology is following  4. Stage IV lung cancer with malignant pleural effusions and COPD with prior tobacco use: -Prior thoracentesis x2 -Oncology following -Complete cessation of tobacco recommended  5.  HLD: -LDL of 81 this admission with goal being less than 70 -Continue PTA Crestor -In outpatient follow-up consider addition  of Zetia  6. Anemia: -Close monitoring with heparin gtt -No indication for pRBC at this time  For questions or updates, please contact [place] Please consult [email or web address] for contact info under Cardiology/STEMI.    Signed, Christell Faith, PA-C [facility] Pager: [phone] 03/08/2019, 9:34 AM

## 2019-03-08 NOTE — Progress Notes (Deleted)
Dr. Clementeen Graham aware of CBG 69. Patient about to eat some dinner. Instructed to check again around 1830 and restart D5NS if CBG below 60.

## 2019-03-08 NOTE — Evaluation (Addendum)
Occupational Therapy Evaluation Patient Details Name: Craig Koch MRN: [id number] DOB: 08-05-1955 Today's Date: 03/08/2019    History of Present Illness 64 y.o. male with medical history significant of hypertension, hyperlipidemia, diabetes mellitus, depression, anxiety, lung cancer, CAD, CABG, tobacco abuse, alcohol abuse, left pleural effusion, who presents with shortness of breath and generalized weakness   Clinical Impression   Pt was seen for OT evaluation this date. Prior to hospital admission, pt was mostly Indep with BADLs with some increased assist from spouse in recent weeks leading up to admit for dressing. Pt lives with spouse in mobile home with 3 STE with bilateral railing. Spouse reports that she works and while she can help and take off work some when pt comes home, she needs to continue to work to pay the bills. Currently pt demonstrates impairments as described below (See OT problem list) which functionally limit his ability to perform ADL/self-care tasks. Pt currently requires MOD A for transition from EOB sitting and states he feels too weak to try and stand this date although OT offers to call for second person to assist. Pt seated EOB demos F-/P+ static sitting balance requiring seated support of at least one UE and difficulty with low reaching to simulate LB dressing at this time. Overall, pt presents deconditioned with gross weakness of trunk and limbs.  Pt would benefit from skilled OT to address noted impairments and functional limitations (see below for any additional details) in order to maximize safety and independence while minimizing falls risk and caregiver burden. Upon hospital discharge, recommend SNF to maximize pt safety and return to PLOF.     Follow Up Recommendations  SNF    Equipment Recommendations  Other (comment)(defer to next level of care)    Recommendations for Other Services       Precautions / Restrictions Precautions Precautions:  Fall Restrictions Weight Bearing Restrictions: No      Mobility Bed Mobility Overal bed mobility: Needs Assistance Bed Mobility: Supine to Sit;Sit to Supine     Supine to sit: Min assist Sit to supine: Mod assist   General bed mobility comments: MOD A to manage UB for back to bed  Transfers                 General transfer comment: pt reports feeling too weak to attempt transfer with OT at this time. Per PT note, pt requiring MAX A +2, tolerates very little stand time, and does not acheive full extension.    Balance Overall balance assessment: Needs assistance Sitting-balance support: Single extremity supported;Feet supported   Sitting balance - Comments: pt with c/o slight light headedness with position change. OT engages pt in ankle pumps which he reports helps some. Pt with F-/P+ static sitting balance, requiring support of at least one UE to sustain.       Standing balance comment: unable to assess on OT eval                           ADL either performed or assessed with clinical judgement   ADL Overall ADL's : Needs assistance/impaired Eating/Feeding: Set up;Bed level Eating/Feeding Details (indicate cue type and reason): HOB elevated Grooming: Set up;Bed level Grooming Details (indicate cue type and reason): HOB elevated         Upper Body Dressing : Moderate assistance;Sitting Upper Body Dressing Details (indicate cue type and reason): pt with F-/P+ static sitting balance EOB requiring at least one UE for  support making seated UB dsg task difficult Lower Body Dressing: Maximal assistance;Sitting/lateral leans     Toilet Transfer Details (indicate cue type and reason): unable to assess ADL transfer at this time, pt declines, states he is feeling too weak                 Vision Patient Visual Report: No change from baseline       Perception     Praxis      Pertinent Vitals/Pain Pain Assessment: Faces Faces Pain Scale: Hurts a  little bit Pain Location: R antecubital (somewhat red/irritated/swollen in [facility] area) Pain Descriptors / Indicators: Sore Pain Intervention(s): Monitored during session;Repositioned(elevated R UE on pillow)     Hand Dominance     Extremity/Trunk Assessment Upper Extremity Assessment Upper Extremity Assessment: Generalized weakness;RUE deficits/detail;LUE deficits/detail RUE Deficits / Details: shld flexion 2/3 arc of motion, MMT of elbow & grip 3+/5 LUE Deficits / Details: shld flexion 1/2 arc of motion, MMT of elbow & grip 3+/5   Lower Extremity Assessment Lower Extremity Assessment: Defer to PT evaluation;Generalized weakness       Communication Communication Communication: No difficulties   Cognition Arousal/Alertness: Awake/alert Behavior During Therapy: Flat affect Overall Cognitive Status: Within Functional Limits for tasks assessed                                 General Comments: Pt sleepy and fatigue, but generally able to follow instructions.   General Comments       Exercises Other Exercises Other Exercises: OT facilitates education with pt and spouse re: role of OT in acute setting and potential need for continued therapy services in rehab setting. Both parties verbalize understanding.   Shoulder Instructions      Home Living Family/patient expects to be discharged to:: Skilled nursing facility Living Arrangements: Spouse/significant other     Home Access: Stairs to enter Entrance Stairs-Number of Steps: 3 Entrance Stairs-Rails: Can reach both Home Layout: One level               Home Equipment: Clinical cytogeneticist - standard   Additional Comments: pt's spouse present and reports that the walker at home is over 13 years old and has no wheels.      Prior Functioning/Environment Level of Independence: Independent        Comments: Pt reports he does not go out a lot, but is able to walk outside around the home, etc w/o issue         OT Problem List: Decreased strength;Decreased range of motion;Decreased activity tolerance;Impaired balance (sitting and/or standing);Decreased knowledge of use of DME or AE;Cardiopulmonary status limiting activity      OT Treatment/Interventions: Self-care/ADL training;Therapeutic exercise;Energy conservation;DME and/or AE instruction;Therapeutic activities;Patient/family education;Balance training    OT Goals(Current goals can be found in the care plan section) Acute Rehab OT Goals Patient Stated Goal: get stronger OT Goal Formulation: With patient/family Time For Goal Achievement: 03/22/19 Potential to Achieve Goals: Good  OT Frequency: Min 2X/week   Barriers to D/C:            Co-evaluation              AM-PAC OT "6 Clicks" Daily Activity     Outcome Measure Help from another person eating meals?: A Little Help from another person taking care of personal grooming?: A Little Help from another person toileting, which includes using toliet, bedpan, or urinal?: A Lot Help  from another person bathing (including washing, rinsing, drying)?: A Lot Help from another person to put on and taking off regular upper body clothing?: A Lot Help from another person to put on and taking off regular lower body clothing?: A Lot 6 Click Score: 14   End of Session    Activity Tolerance: Patient limited by lethargy Patient left: in bed;with call bell/phone within reach;with bed alarm set;with family/visitor present(CNA presenting to take blood glucose and VS)  OT Visit Diagnosis: Unsteadiness on feet (R26.81);Muscle weakness (generalized) (M62.81)                Time: 2353-6144 OT Time Calculation (min): 31 min Charges:  OT General Charges $OT Visit: 1 Visit OT Evaluation $OT Eval Moderate Complexity: 1 Mod OT Treatments $Self Care/Home Management : 8-22 mins  Gerrianne Scale, MS, OTR/L ascom [phone] 03/08/19, 1:52 PM

## 2019-03-08 NOTE — Consult Note (Signed)
Pharmacy Antibiotic Note  Craig Koch is a 64 y.o. male admitted on 03/03/2019 with medical history significant ofhypertension, hyperlipidemia, diabetes mellitus, depression, anxiety, lung cancer, CAD, CABG, tobacco abuse, alcohol abuse, left pleural effusion,who presents with new onset atrial fibillation and now with sepsis of an unclear origin. Pharmacy has been consulted for vancomycin and Zosyn dosing. This is day # 4 of antibiotics   Vancomycin 1500 mg IV x1 given 3/2 at 1237 VR 3/3 at 0152 = 16 mcg/ml VR 3/4 at 0310 = 14 mcg/ml Vancomycin 1000 mg IV x1 given 3/4 at 1210 VR 3/5 at 0707 = 20 mcg/ml   Plan: 1) continue Zosyn 3.375g IV to q12 h (4 hour infusion)  2) VR 3/5 at 0707 = 20 mcg/ml  Will recheck random vancomycin levels in the morning to guide further dosing  BMP in am to continue to monitor renal function, SCr continues to trend up   Height: 6' (182.9 cm) Weight: 184 lb 15.5 oz (83.9 kg) IBW/kg (Calculated) : 77.6  Temp (24hrs), Avg:97.7 F (36.5 C), Min:97.4 F (36.3 C), Max:98 F (36.7 C)  Recent Labs  Lab 03/04/19 1059 03/05/19 0153 03/06/19 0152 03/06/19 0152 03/07/19 0310 03/08/19 0707  WBC 8.5 8.5 7.8  --  6.3 7.9  CREATININE 2.13* 3.16* 4.22*  --  5.25* 5.94*  VANCORANDOM  --   --  16   < > 14 20   < > = values in this interval not displayed.    Estimated Creatinine Clearance: 14 mL/min (A) (by C-G formula based on SCr of 5.94 mg/dL (H)).    Antimicrobials this admission: vancomycin 3/2 >>  Zosyn 3/2 >>   Microbiology results: 3/2 BCx: no growth to date 2/28 SARS CoV-2: negative  2/28 influenza A/B: negative 2/28 MRSA PCR: negative  Thank you for allowing pharmacy to be a part of this patient's care.  Rayna Sexton L 03/08/2019 10:14 AM

## 2019-03-08 NOTE — Progress Notes (Signed)
Craig Koch, [place] 03/08/19  Subjective:   Hospital day # 5 S Creatinine trend is worsening  UOP borderline low.  Has an external catheter    Renal: 03/04 0701 - 03/05 0700 In: 751.9 [I.V.:550.6; IV Piggyback:201.3] Out: 550 [Urine:550] Lab Results  Component Value Date   CREATININE 5.94 (H) 03/08/2019   CREATININE 5.25 (H) 03/07/2019   CREATININE 4.22 (H) 03/06/2019  Sitting up in the bed Starting to eat breakfast when seen More alert today.  No shortness of breath   Objective:  Vital signs in last 24 hours:  Temp:  [97.4 F (36.3 C)-98 F (36.7 C)] 98 F (36.7 C) (03/05 0755) Pulse Rate:  [68-87] 68 (03/05 0755) Resp:  [16-31] 16 (03/05 0755) BP: (105-117)/(60-67) 115/66 (03/05 0755) SpO2:  [97 %-100 %] 99 % (03/05 0755)  Weight change:  Filed Weights   03/03/19 1328 03/03/19 2000  Weight: 86.2 kg 83.9 kg    Intake/Output:    Intake/Output Summary (Last 24 hours) at 03/08/2019 1114 Last data filed at 03/08/2019 0521 Gross per 24 hour  Intake 562.8 ml  Output 550 ml  Net 12.8 ml     Physical Exam: General:  Ill-appearing, laying in the bed  HEENT  moist oral mucous membranes, decreased hearing  Pulm/lungs  normal effort, clear anteriorly and laterally  CVS/Heart  regular rhythm  Abdomen:   Soft, nontender  Extremities:  Trace to 1+ pitting edema  Neurologic:  Alert,  able to answer simple questions appropriately  Skin:  Warm, dry          Basic Metabolic Panel:  Recent Labs  Lab 03/03/19 1750 03/03/19 1936 03/04/19 1059 03/04/19 1059 03/05/19 0153 03/05/19 0153 03/06/19 0152 03/07/19 0310 03/08/19 0707  NA 123*   < > 127*  --  128*  --  129* 130* 132*  K 5.4*   < > 3.7  --  3.5  --  3.3* 3.3* 3.5  CL 89*   < > 102  --  102  --  102 101 105  CO2 8*   < > 14*  --  15*  --  14* 16* 13*  GLUCOSE 756*   < > 134*  --  311*  --  182* 144* 59*  BUN 43*   < > 53*  --  65*  --  75* 84* 92*  CREATININE 1.79*   < >  2.13*  --  3.16*  --  4.22* 5.25* 5.94*  CALCIUM 8.2*   < > 8.0*   < > 8.0*   < > 8.2* 8.2* 7.9*  MG 2.3  --   --   --  1.9  --  1.9  --   --   PHOS  --   --   --   --  1.4*  --   --   --   --    < > = values in this interval not displayed.     CBC: Recent Labs  Lab 03/03/19 1330 03/03/19 2359 03/04/19 1059 03/05/19 0153 03/06/19 0152 03/07/19 0310 03/08/19 0707  WBC 9.4   < > 8.5 8.5 7.8 6.3 7.9  NEUTROABS 7.3  --   --   --   --   --   --   HGB 13.4   < > 12.2* 11.1* 11.0* 10.2* 9.2*  HCT 42.9   < > 34.2* 31.1* 31.0* 28.3* 26.2*  MCV 87.4   < > 77.0* 77.8* 77.1* 75.7* 77.1*  PLT 232   < > 210 189 204 244 256   < > = values in this interval not displayed.     No results found for: HEPBSAG, HEPBSAB, HEPBIGM    Microbiology:  Recent Results (from the past 240 hour(s))  Respiratory Panel by RT PCR (Flu A&B, Covid) - Nasopharyngeal Swab     Status: None   Collection Time: 03/03/19  3:31 PM   Specimen: Nasopharyngeal Swab  Result Value Ref Range Status   SARS Coronavirus 2 by RT PCR NEGATIVE NEGATIVE Final    Comment: (NOTE) SARS-CoV-2 target nucleic acids are NOT DETECTED. The SARS-CoV-2 RNA is generally detectable in upper respiratoy specimens during the acute phase of infection. The lowest concentration of SARS-CoV-2 viral copies this assay can detect is 131 copies/mL. A negative result does not preclude SARS-Cov-2 infection and should not be used as the sole basis for treatment or other patient management decisions. A negative result may occur with  improper specimen collection/handling, submission of specimen other than nasopharyngeal swab, presence of viral mutation(s) within the areas targeted by this assay, and inadequate number of viral copies (<131 copies/mL). A negative result must be combined with clinical observations, patient history, and epidemiological information. The expected result is Negative. Fact Sheet for Patients:   PinkCheek.be Fact Sheet for Healthcare Providers:  GravelBags.it This test is not yet ap proved or cleared by the [place] FDA and  has been authorized for detection and/or diagnosis of SARS-CoV-2 by FDA under an Emergency Use Authorization (EUA). This EUA will remain  in effect (meaning this test can be used) for the duration of the COVID-19 declaration under Section 564(b)(1) of the Act, 21 U.S.C. section 360bbb-3(b)(1), unless the authorization is terminated or revoked sooner.    Influenza A by PCR NEGATIVE NEGATIVE Final   Influenza B by PCR NEGATIVE NEGATIVE Final    Comment: (NOTE) The Xpert Xpress SARS-CoV-2/FLU/RSV assay is intended as an aid in  the diagnosis of influenza from Nasopharyngeal swab specimens and  should not be used as a sole basis for treatment. Nasal washings and  aspirates are unacceptable for Xpert Xpress SARS-CoV-2/FLU/RSV  testing. Fact Sheet for Patients: PinkCheek.be Fact Sheet for Healthcare Providers: GravelBags.it This test is not yet approved or cleared by the [place] FDA and  has been authorized for detection and/or diagnosis of SARS-CoV-2 by  FDA under an Emergency Use Authorization (EUA). This EUA will remain  in effect (meaning this test can be used) for the duration of the  Covid-19 declaration under Section 564(b)(1) of the Act, 21  U.S.C. section 360bbb-3(b)(1), unless the authorization is  terminated or revoked. Performed at [facility], [address] [place], [place] [address]   MRSA PCR Screening     Status: None   Collection Time: 03/03/19  7:48 PM   Specimen: Nasopharyngeal  Result Value Ref Range Status   MRSA by PCR NEGATIVE NEGATIVE Final    Comment:        The GeneXpert MRSA Assay (FDA approved for NASAL specimens only), is one component of a comprehensive MRSA colonization surveillance  program. It is not intended to diagnose MRSA infection nor to guide or monitor treatment for MRSA infections. Performed at [facility], [address] [place], [place] [address]   CULTURE, BLOOD (ROUTINE X 2) w Reflex to ID Panel     Status: None (Preliminary result)   Collection Time: 03/05/19 10:16 AM   Specimen: BLOOD  Result Value Ref Range Status  Specimen Description BLOOD LEFT ANTECUBITAL  Final   Special Requests   Final    BOTTLES DRAWN AEROBIC AND ANAEROBIC Blood Culture adequate volume   Culture   Final    NO GROWTH 3 DAYS Performed at [facility], [address] [place], [place] [address]    Report Status PENDING  Incomplete  CULTURE, BLOOD (ROUTINE X 2) w Reflex to ID Panel     Status: None (Preliminary result)   Collection Time: 03/05/19 10:22 AM   Specimen: BLOOD  Result Value Ref Range Status   Specimen Description BLOOD BLOOD LEFT HAND  Final   Special Requests   Final    BOTTLES DRAWN AEROBIC ONLY Blood Culture adequate volume   Culture   Final    NO GROWTH 3 DAYS Performed at [facility], [address], [place], [place] [address]    Report Status PENDING  Incomplete    Coagulation Studies: No results for input(s): LABPROT, INR in the last 72 hours.  Urinalysis: No results for input(s): COLORURINE, LABSPEC, PHURINE, GLUCOSEU, HGBUR, BILIRUBINUR, KETONESUR, PROTEINUR, UROBILINOGEN, NITRITE, LEUKOCYTESUR in the last 72 hours.  Invalid input(s): APPERANCEUR    Imaging: No results found.   Medications:   . dextrose 5 % and 0.9% NaCl 75 mL/hr at 03/08/19 0914  . diltiazem (CARDIZEM) infusion 10 mg/hr (03/03/19 1815)  . heparin 2,600 Units/hr (03/08/19 1026)  . piperacillin-tazobactam (ZOSYN)  IV 3.375 g (03/08/19 1022)  . small volume/piggyback builder Stopped (03/07/19 2024)   . amiodarone  400 mg Oral BID  . aspirin EC  81 mg Oral Daily  . chlorhexidine  15 mL Mouth Rinse BID  . Chlorhexidine Gluconate Cloth   6 each Topical Daily  . dextromethorphan-guaiFENesin  1 tablet Oral BID  . dronabinol  5 mg Oral BID AC  . DULoxetine  30 mg Oral Daily  . insulin aspart  0-15 Units Subcutaneous Q4H  . ipratropium-albuterol  3 mL Nebulization TID  . mouth rinse  15 mL Mouth Rinse q12n4p  . metoprolol tartrate  12.5 mg Oral BID  . multivitamin with minerals  1 tablet Oral Daily  . nicotine  21 mg Transdermal Daily  . polyethylene glycol  17 g Oral Daily  . Ensure Max Protein  11 oz Oral BID BM  . rosuvastatin  10 mg Oral Daily  . vancomycin variable dose per unstable renal function (pharmacist dosing)   Does not apply See admin instructions   acetaminophen, albuterol, dextrose, ondansetron (ZOFRAN) IV, pneumococcal 23 valent vaccine  Assessment/ Plan:  64 y.o. male with hypertension, hyperlipidemia, diabetes, depression, anxiety, adenocarcinoma of the lung stage IV, coronary disease, history of CABG, tobacco and h/o alcohol abuse, left pleural effusion,  admitted on 03/03/2019 for DKA (diabetic ketoacidoses) ([place]) [E11.10] New onset atrial fibrillation ([place]) [I48.91] Atrial fibrillation with rapid ventricular response ([place]) [I48.91] Diabetic ketoacidosis without coma associated with other specified diabetes mellitus ([place]) [E13.10]     #Acute kidney injury, Metabolic acidosis Baseline creatinine of 0.7 on February 5.  Patient did have IV contrast exposure on January 19. Renal imaging: Kidney ultrasound March 1: Normal kidneys  Acute kidney injury is likely secondary to ATN caused by hypotension, hemodynamic instability.   Avoid further hypotension Gentle IV hydration Supportive care Worsening renal parameters-today's creatinine has increased to 5.9.  No acute obvious uremic symptoms or shortness of breath No acute indication for dialysis at present but may need this admission    #Hyponatremia Likely multifactorial with contribution from acute kidney injury  and also possibility of liver disease  from reported alcohol abuse  #Diabetic ketoacidosis, poorly controlled diabetes Management as per ICU team Lab Results  Component Value Date   HGBA1C 11.3 (H) 03/04/2019   Multiple underlying chronic illnesses, poorly controlled diabetes, reported history of tobacco and h/o alcohol abuse, with underlying coronary disease, history of CABG, CHF with EF 30 to 35%, stage IV adenocarcinoma  # Hypokalemia Oral replacement as necessary       LOS: 5 Craig Koch Craig Koch 3/5/202111:14 AM  [place], [place]  Note: This note was prepared with Dragon dictation. Any transcription errors are unintentional

## 2019-03-08 NOTE — Consult Note (Signed)
ANTICOAGULATION CONSULT NOTE  Pharmacy Consult for Heparin  Indication: atrial fibrillation- new onset  Patient Measurements: Height: 6' (182.9 cm) Weight: 190 lb 12.8 oz (86.5 kg) IBW/kg (Calculated) : 77.6 Heparin Dosing Weight: 86.2 kg   Vital Signs: Temp: 97.3 F (36.3 C) (03/05 1950) Temp Source: Oral (03/05 1950) BP: 104/69 (03/05 1950) Pulse Rate: 70 (03/05 1950)  Labs: Recent Labs    03/06/19 0152 03/06/19 0855 03/07/19 0310 03/07/19 0131 03/07/19 1856 03/08/19 0707 03/08/19 1848  HGB 11.0*  --  10.2*  --   --  9.2*  --   HCT 31.0*  --  28.3*  --   --  26.2*  --   PLT 204  --  244  --   --  256  --   HEPARINUNFRC 0.23*   < >  --    < > 0.14* <0.10* 1.26*  CREATININE 4.22*  --  5.25*  --   --  5.94*  --    < > = values in this interval not displayed.    Estimated Creatinine Clearance: 14 mL/min (A) (by C-G formula based on SCr of 5.94 mg/dL (H)).   Medications:  Called RN to confirm whether pt has been on [facility] prior to admission. Nurse was not able to confirm as she was not in the patient's room. Per chart review, no AC prior to admission and confirmed with pharmacy technician who spoke to patient.   Heparin Course: 02/28 initiation: 1150 units/hr 02/28 2350 HL 0.26: inc to 1300 units/hr 03/01 0530 HL 0.31: no change 03/01 1059 HL 0.24: inc to 1450 units/hr 03/01 1849 HL 0.28: inc to 1550 units/hr 03/02 0153 HL 0.33: no change 03/02 0758 HL 0.28: inc to 1700 units/hr 03/02 1743 HL 0.30: no change 03/03 0152 HL 0.23: inc to 1950 units/hr 03/03 1449 HL 0.32: no change 03/04 0633 HL <0.10: inc to 2200 units/hr 03/04 1856 HL 0.14: 2000 unit bolus, incto 2400 units/hr.  03/05 0707 HL <0.10: 2500 units bolus, inc to 2600 units/hr 0605 1848 HL 1.26  Assessment: Craig Koch is a 64 y.o. male with a past medical history of anxiety, CAD, diabetes, and hyperlipidemia, hypertension, lung cancer, presents to the emergency department for shortness of breath.  Pharmacy was consulted for heparin dosing in a patient with new onset atrial fibrillation. H&H trending down slightly, platelets are stable  Goal of Therapy:  Heparin level 0.3-0.7 units/ml Monitor platelets by anticoagulation protocol: Yes   Plan:  3/5 1848 HL 1.26 is now supratherapeutic. Will hold Heparin for 1 hour and decrease heparin drip to 2400 units/hr. Recheck HL in 8h.  CBC daily.  Difficulty in getting HL to goal and to keep therapeutic. Antithrombin III level was ordered earlier today. Per lab, antithrombin III is a send out to [facility], may be able to add in on to AM labs from today.   Pharmacy will continue to monitor and adjust per consult.   Paulina Fusi, PharmD, BCPS 03/08/2019 10:29 PM

## 2019-03-08 NOTE — Progress Notes (Signed)
Hematology/Oncology Progress Note [facility] [phone] [phone]  Patient Care Team: Tonia Ghent, MD as PCP - General (Family Medicine) Thelma Comp, [place] (Optometry) Telford Nab, RN as Registered Nurse   Name of the patient: Craig Koch  [id number]  Dec 17, 1955  Date of visit: 03/08/19   INTERVAL HISTORY-  Wife is at bedside today.  Patient has been transferred out of ICU. Mental status has improved.  He reports feeling weak and shortness of breath.  Denies any pain.   Review of systems- Review of Systems  Unable to perform ROS: Mental status change  Constitutional: Positive for fatigue.  Respiratory: Positive for shortness of breath.     Allergies  Allergen Reactions  . Lipitor [Atorvastatin Calcium] Other (See Comments)    Aches.  Tolerated crestor.     Patient Active Problem List   Diagnosis Date Noted  . Acute systolic heart failure ([place])   . Protein-calorie malnutrition, severe 03/06/2019  . Acute alteration in mental status   . Palliative care encounter   . Atrial fibrillation with RVR ([place]) 03/03/2019  . DKA (diabetic ketoacidoses) ([place]) 03/03/2019  . Diabetes mellitus without complication ([place]) 19/37/9024  . Hypothermia 03/03/2019  . SOB (shortness of breath) 03/03/2019  . AKI (acute kidney injury) ([place]) 03/03/2019  . NSTEMI (non-ST elevated myocardial infarction) ([place]) 03/03/2019  . Hyponatremia 02/08/2019  . Uncontrolled type 2 diabetes mellitus with hyperglycemia ([place]) 02/08/2019  . Leg swelling 02/08/2019  . Paronychia, finger 12/19/2018  . Encounter for antineoplastic chemotherapy 12/08/2018  . Tobacco abuse 10/12/2018  . Malignant pleural effusion   . Administrative encounter 09/23/2018  . lung adenocarcinoma 09/08/2018  . Goals of care, counseling/discussion 09/08/2018  . Recurrent pleural effusion on left 09/02/2018  . Elevated troponin 08/24/2018  . Medicare annual wellness visit,  subsequent 08/07/2018  . Lower urinary tract symptoms (LUTS) 08/07/2018  . Neck pain 08/07/2018  . CAD (coronary artery disease), native coronary artery 09/23/2017  . Smoker 09/23/2017  . Chronic joint pain 10/18/2016  . Healthcare maintenance 07/17/2016  . Advance care planning 07/17/2016  . Fungal rash of torso 05/06/2016  . Trigger finger 05/06/2016  . History of alcohol use 11/17/2015  . Skin lesion 05/19/2015  . Anxiety state 02/19/2015  . Hand weakness 05/23/2011  . Diabetes mellitus with complication ([place]) 09/73/5329  . HLD (hyperlipidemia) 03/05/2010  . Essential hypertension 03/05/2010  . Coronary atherosclerosis 03/05/2010     Past Medical History:  Diagnosis Date  . Anxiety   . Arthritis   . Coronary artery disease   . Diabetes mellitus   . Dyslipidemia   . Hx of CABG   . Hyperlipidemia   . Hypertension   . Malignant neoplasm of unspecified part of unspecified bronchus or lung ([place]) 08/2018   Immunotherapy  . Pleural effusion      Past Surgical History:  Procedure Laterality Date  . CATARACT EXTRACTION Left 09/2015  . CHEST TUBE INSERTION Left 10/01/2018   Procedure: INSERTION PLEURAL DRAINAGE CATHETER;  Surgeon: [name], MD;  Location: [facility] ORS;  Service: Thoracic;  Laterality: Left;  . CORONARY ARTERY BYPASS GRAFT  2004   (CABG with LIMA to the  LAD, SVG to OM2/OM3, SVG  to diag  . Left ankle surgery     repair of fracture  . Right lower leg surgery     rod    Social History   Socioeconomic History  . Marital status: Married    Spouse name: Not on file  .  Number of children: Not on file  . Years of education: Not on file  . Highest education level: Not on file  Occupational History  . Not on file  Tobacco Use  . Smoking status: Current Every Day Smoker    Packs/day: 0.20    Years: 45.00    Pack years: 9.00    Types: Cigarettes  . Smokeless tobacco: Former Systems developer    Types: Snuff  Substance and Sexual Activity  . Alcohol use: Yes     Alcohol/week: 6.0 standard drinks    Types: 6 Cans of beer per week    Comment: occ, average 6 pack in a week  . Drug use: No  . Sexual activity: Not Currently  Other Topics Concern  . Not on file  Social History Narrative   On disability 2009 after prev injuries and CAD.     Married 1976   2 kids, 4 grandkids.    Social Determinants of Health   Financial Resource Strain:   . Difficulty of Paying Living Expenses: Not on file  Food Insecurity:   . Worried About Charity fundraiser in the Last Year: Not on file  . Ran Out of Food in the Last Year: Not on file  Transportation Needs:   . Lack of Transportation (Medical): Not on file  . Lack of Transportation (Non-Medical): Not on file  Physical Activity:   . Days of Exercise per Week: Not on file  . Minutes of Exercise per Session: Not on file  Stress:   . Feeling of Stress : Not on file  Social Connections:   . Frequency of Communication with Friends and Family: Not on file  . Frequency of Social Gatherings with Friends and Family: Not on file  . Attends Religious Services: Not on file  . Active Member of Clubs or Organizations: Not on file  . Attends Archivist Meetings: Not on file  . Marital Status: Not on file  Intimate Partner Violence:   . Fear of Current or Ex-Partner: Not on file  . Emotionally Abused: Not on file  . Physically Abused: Not on file  . Sexually Abused: Not on file     Family History  Problem Relation Age of Onset  . Dementia Mother   . Heart disease Father   . Colon cancer Neg Hx   . Prostate cancer Neg Hx   . Diabetes Neg Hx      Current Facility-Administered Medications:  .  acetaminophen (TYLENOL) tablet 650 mg, 650 mg, Oral, Q6H PRN, Ivor Costa, MD .  albuterol (PROVENTIL) (2.5 MG/3ML) 0.083% nebulizer solution 3 mL, 3 mL, Inhalation, Q4H PRN, Ivor Costa, MD .  amiodarone (PACERONE) tablet 400 mg, 400 mg, Oral, BID, Dunn, Ryan M, PA-C, 400 mg at 03/08/19 2054 .  aspirin EC  tablet 81 mg, 81 mg, Oral, Daily, Agbor-Etang, Aaron Edelman, MD, 81 mg at 03/08/19 0915 .  chlorhexidine (PERIDEX) 0.12 % solution 15 mL, 15 mL, Mouth Rinse, BID, Darel Hong D, NP, 15 mL at 03/08/19 2054 .  Chlorhexidine Gluconate Cloth 2 % PADS 6 each, 6 each, Topical, Daily, Darel Hong D, NP .  dextromethorphan-guaiFENesin ([place] DM) 30-600 MG per 12 hr tablet 1 tablet, 1 tablet, Oral, BID, Ivor Costa, MD, 1 tablet at 03/08/19 2054 .  dextrose 5 %-0.9 % sodium chloride infusion, , Intravenous, Continuous, Dhungel, Nishant, MD, Last Rate: 75 mL/hr at 03/08/19 2247, New Bag at 03/08/19 2247 .  dextrose 50 % solution 0-50 mL, 0-50 mL,  Intravenous, PRN, Ivor Costa, MD, 25 mL at 03/08/19 0355 .  diltiazem (CARDIZEM) 125 mg in dextrose 5% 125 mL (1 mg/mL) infusion, 5-15 mg/hr, Intravenous, Titrated, Ivor Costa, MD, Last Rate: 10 mL/hr at 03/03/19 1815, 10 mg/hr at 03/03/19 1815 .  dronabinol (MARINOL) capsule 5 mg, 5 mg, Oral, BID AC, Ivor Costa, MD, 5 mg at 03/08/19 1608 .  DULoxetine (CYMBALTA) DR capsule 30 mg, 30 mg, Oral, Daily, Ivor Costa, MD, 30 mg at 03/08/19 0916 .  heparin ADULT infusion 100 units/mL (25000 units/224m sodium chloride 0.45%), 2,400 Units/hr, Intravenous, Continuous, Dhungel, Nishant, MD .  insulin aspart (novoLOG) injection 0-15 Units, 0-15 Units, Subcutaneous, Q4H, SRalene MuskratB, MD, 2 Units at 03/07/19 [phone].  ipratropium-albuterol (DUONEB) 0.5-2.5 (3) MG/3ML nebulizer solution 3 mL, 3 mL, Nebulization, TID, Dhungel, Nishant, MD, 3 mL at 03/08/19 1935 .  MEDLINE mouth rinse, 15 mL, Mouth Rinse, q12n4p, KDarel HongD, NP, 15 mL at 03/07/19 1639 .  menthol-cetylpyridinium (CEPACOL) lozenge 3 mg, 1 lozenge, Oral, PRN, YEarlie Server MD .  metoprolol tartrate (LOPRESSOR) tablet 12.5 mg, 12.5 mg, Oral, BID, Dunn, Ryan M, PA-C, 12.5 mg at 03/08/19 2055 .  multivitamin with minerals tablet 1 tablet, 1 tablet, Oral, Daily, NIvor Costa MD, 1 tablet at 03/08/19 0915 .   nicotine (NICODERM CQ - dosed in mg/24 hours) patch 21 mg, 21 mg, Transdermal, Daily, NIvor Costa MD, 21 mg at 03/07/19 0846 .  ondansetron (ZOFRAN) injection 4 mg, 4 mg, Intravenous, Q8H PRN, NIvor Costa MD .  pneumococcal 23 valent vaccine (PNEUMOVAX-23) injection 0.5 mL, 0.5 mL, Intramuscular, Prior to discharge, Sreenath, Sudheer B, MD .  polyethylene glycol (MIRALAX / GLYCOLAX) packet 17 g, 17 g, Oral, Daily, [place], EBing Neighbors NP, 17 g at 03/08/19 0925 .  protein supplement (ENSURE MAX) liquid, 11 oz, Oral, BID BM, Dhungel, Nishant, MD, 11 oz at 03/08/19 1444 .  rosuvastatin (CRESTOR) tablet 10 mg, 10 mg, Oral, Daily, NIvor Costa MD, 10 mg at 03/08/19 0916 .  sodium bicarbonate tablet 1,300 mg, 1,300 mg, Oral, BID, SMurlean Iba MD, 1,300 mg at 03/08/19 2054 .  thiamine (B-1) 1431mg, folic acid 1 mg in sodium chloride 0.9 % 50 mL injection, , Intravenous, Q24H, GDallie Piles RPH, Last Rate: 50 mL/hr at 03/08/19 1608, New Bag at 03/08/19 1608   Physical exam:  Vitals:   03/08/19 1407 03/08/19 1623 03/08/19 1936 03/08/19 1950  BP:  104/64  104/69  Pulse:  68  70  Resp:  16  18  Temp:  (!) 97.5 F (36.4 C)  (!) 97.3 F (36.3 C)  TempSrc:  Oral  Oral  SpO2: 99% 100% 99% 100%  Weight:      Height:       Physical Exam  Constitutional: No distress.  Eyes: No scleral icterus.  Cardiovascular: Normal rate.  Pulmonary/Chest: Effort normal. No respiratory distress.  Abdominal: Soft.  Musculoskeletal:        General: Normal range of motion.     Cervical back: Neck supple.     Comments: Right upper arm tenderness  Neurological: He is alert.  Skin: Skin is warm.  Ecchymosis and bruising on right upper extremity.       CMP Latest Ref Rng & Units 03/08/2019  Glucose 70 - 99 mg/dL 59(L)  BUN 8 - 23 mg/dL 92(H)  Creatinine 0.61 - 1.24 mg/dL 5.94(H)  Sodium 135 - 145 mmol/L 132(L)  Potassium 3.5 - 5.1 mmol/L 3.5  Chloride 98 - 111  mmol/L 105  CO2 22 - 32 mmol/L 13(L)    Calcium 8.9 - 10.3 mg/dL 7.9(L)  Total Protein 6.5 - 8.1 g/dL -  Total Bilirubin 0.3 - 1.2 mg/dL -  Alkaline Phos 38 - 126 U/L -  AST 15 - 41 U/L -  ALT 0 - 44 U/L -   CBC Latest Ref Rng & Units 03/08/2019  WBC 4.0 - 10.5 K/uL 7.9  Hemoglobin 13.0 - 17.0 g/dL 9.2(L)  Hematocrit 39.0 - 52.0 % 26.2(L)  Platelets 150 - 400 K/uL 256    RADIOGRAPHIC STUDIES: I have personally reviewed the radiological images as listed and agreed with the findings in the report. CT HEAD WO CONTRAST  Result Date: 03/05/2019 CLINICAL DATA:  Metastatic disease evaluation.  Lung cancer. EXAM: CT HEAD WITHOUT CONTRAST TECHNIQUE: Contiguous axial images were obtained from the base of the skull through the vertex without intravenous contrast. COMPARISON:  MRI head 09/20/2018 FINDINGS: Brain: Mild atrophy. Mild hypodensity in the white matter unchanged from the prior MRI. No acute infarct, hemorrhage, mass. No edema or midline shift. Vascular: Negative for hyperdense vessel Skull: Negative skull. Sinuses/Orbits: Mucosal edema and bony thickening right maxillary sinus. Chronic nasal bone fracture. Left cataract extraction. Other: None IMPRESSION: No acute abnormality and negative for metastatic disease on unenhanced CT. Atrophy and mild chronic microvascular ischemia. Electronically Signed   By: Franchot Gallo M.D.   On: 03/05/2019 15:28   US RENAL  Result Date: 03/04/2019 CLINICAL DATA:  Acute kidney injury EXAM: RENAL / URINARY TRACT ULTRASOUND COMPLETE COMPARISON:  CT 01/22/2019 FINDINGS: Right Kidney: Renal measurements: 11.4 x 6 x 6 cm = volume: 215 mL . Echogenicity within normal limits. No mass or hydronephrosis visualized. Left Kidney: Renal measurements: 11.8 x 7.3 x 6.5 cm = volume: 291.8 mL. Echogenicity within normal limits. No mass or hydronephrosis visualized. Bladder: Appears normal for degree of bladder distention. Other: Incidental note made of gallstones and small effusions. Heterogenous prostate. IMPRESSION:  Normal ultrasound appearance of the kidneys Electronically Signed   By: Donavan Foil M.D.   On: 03/04/2019 15:37   DG Chest Portable 1 View  Result Date: 03/03/2019 CLINICAL DATA:  Shortness of breath, since Monday worsening this morning around 1 a.m. EXAM: PORTABLE CHEST 1 VIEW COMPARISON:  12/19/2018 FINDINGS: Cardiomediastinal contours are stable following median sternotomy with persistent left-sided pleural effusion and pleural-parenchymal scarring. No new areas of consolidation or evidence of right-sided pleural effusion. No signs of acute bone process. IMPRESSION: Persistent left-sided pleural effusion and pleural-parenchymal scarring. Electronically Signed   By: Zetta Bills M.D.   On: 03/03/2019 13:59   ECHOCARDIOGRAM COMPLETE  Result Date: 03/04/2019    ECHOCARDIOGRAM REPORT   Patient Name:   Craig Koch Date of Exam: 03/04/2019 Medical Rec #:  [id number]       Height:       72.0 in Accession #:    [id number]      Weight:       185.0 lb Date of Birth:  1955-08-03       BSA:          2.061 m Patient Age:    18 years        BP:           125/72 mmHg Patient Gender: M               HR:           119 bpm. Exam Location:  [facility] Procedure: 2D Echo, Color Doppler  and Cardiac Doppler Indications:     I21.4 NSTEMI  History:         Patient has prior history of Echocardiogram examinations, most                  recent 08/24/2018. CAD, Prior CABG; Risk Factors:Hypertension,                  Dyslipidemia and Diabetes. Lung Cancer.  Sonographer:     Charmayne Sheer RDCS (AE) Referring Phys:  [place] Phys: Kathlyn Sacramento MD IMPRESSIONS  1. Left ventricular ejection fraction, by estimation, is 30 to 35%. The left ventricle has moderately decreased function. The left ventricle demonstrates global hypokinesis. Left ventricular diastolic parameters are indeterminate.  2. Right ventricular systolic function is normal. The right ventricular size is normal. Tricuspid regurgitation signal is  inadequate for assessing PA pressure.  3. Left atrial size was mild to moderately dilated.  4. The mitral valve is normal in structure and function. Moderate mitral valve regurgitation. No evidence of mitral stenosis.  5. The aortic valve is normal in structure and function. Aortic valve regurgitation is trivial. Mild to moderate aortic valve sclerosis/calcification is present, without any evidence of aortic stenosis.  6. The inferior vena cava is normal in size with greater than 50% respiratory variability, suggesting right atrial pressure of 3 mmHg. FINDINGS  Left Ventricle: Left ventricular ejection fraction, by estimation, is 30 to 35%. The left ventricle has moderately decreased function. The left ventricle demonstrates global hypokinesis. The left ventricular internal cavity size was normal in size. There is no left ventricular hypertrophy. Left ventricular diastolic parameters are indeterminate. Right Ventricle: The right ventricular size is normal. No increase in right ventricular wall thickness. Right ventricular systolic function is normal. Tricuspid regurgitation signal is inadequate for assessing PA pressure. Left Atrium: Left atrial size was mild to moderately dilated. Right Atrium: Right atrial size was normal in size. Pericardium: There is no evidence of pericardial effusion. Mitral Valve: The mitral valve is normal in structure and function. Normal mobility of the mitral valve leaflets. Moderate mitral valve regurgitation. No evidence of mitral valve stenosis. MV peak gradient, 7.5 mmHg. The mean mitral valve gradient is 4.0  mmHg. Tricuspid Valve: The tricuspid valve is normal in structure. Tricuspid valve regurgitation is not demonstrated. No evidence of tricuspid stenosis. Aortic Valve: The aortic valve is normal in structure and function. Aortic valve regurgitation is trivial. Aortic regurgitation PHT measures 281 msec. Mild to moderate aortic valve sclerosis/calcification is present, without any  evidence of aortic stenosis. Aortic valve mean gradient measures 5.0 mmHg. Aortic valve peak gradient measures 8.5 mmHg. Aortic valve area, by VTI measures 2.53 cm. Pulmonic Valve: The pulmonic valve was normal in structure. Pulmonic valve regurgitation is not visualized. No evidence of pulmonic stenosis. Aorta: The aortic root is normal in size and structure. Venous: The inferior vena cava is normal in size with greater than 50% respiratory variability, suggesting right atrial pressure of 3 mmHg. IAS/Shunts: No atrial level shunt detected by color flow Doppler.  LEFT VENTRICLE PLAX 2D LVIDd:         5.21 cm  Diastology LVIDs:         4.21 cm  LV e' lateral:   7.83 cm/s LV PW:         0.84 cm  LV E/e' lateral: 13.4 LV IVS:        0.89 cm  LV e' medial:    7.94 cm/s LVOT diam:  2.40 cm  LV E/e' medial:  13.2 LV SV:         54 LV SV Index:   26 LVOT Area:     4.52 cm  RIGHT VENTRICLE RV Basal diam:  3.08 cm LEFT ATRIUM             Index       RIGHT ATRIUM           Index LA diam:        4.70 cm 2.28 cm/m  RA Area:     14.10 cm LA Vol (A2C):   62.0 ml 30.08 ml/m RA Volume:   35.20 ml  17.08 ml/m LA Vol (A4C):   68.6 ml 33.29 ml/m LA Biplane Vol: 65.3 ml 31.68 ml/m  AORTIC VALVE                    PULMONIC VALVE AV Area (Vmax):    2.95 cm     PV Vmax:       1.10 m/s AV Area (Vmean):   2.86 cm     PV Vmean:      68.400 cm/s AV Area (VTI):     2.53 cm     PV VTI:        0.146 m AV Vmax:           146.00 cm/s  PV Peak grad:  4.8 mmHg AV Vmean:          103.000 cm/s PV Mean grad:  2.0 mmHg AV VTI:            0.213 m AV Peak Grad:      8.5 mmHg AV Mean Grad:      5.0 mmHg LVOT Vmax:         95.30 cm/s LVOT Vmean:        65.200 cm/s LVOT VTI:          0.119 m LVOT/AV VTI ratio: 0.56 AI PHT:            281 msec  AORTA Ao Root diam: 3.00 cm MITRAL VALVE MV Area (PHT): 3.92 cm     SHUNTS MV Peak grad:  7.5 mmHg     Systemic VTI:  0.12 m MV Mean grad:  4.0 mmHg     Systemic Diam: 2.40 cm MV Vmax:       1.37 m/s MV  Vmean:      92.7 cm/s MV Decel Time: 194 msec MV E velocity: 104.80 cm/s Kathlyn Sacramento MD Electronically signed by Kathlyn Sacramento MD Signature Date/Time: 03/04/2019/2:46:43 PM    Final    [place] RT UPPER EXTREM LTD SOFT TISSUE NON VASCULAR  Result Date: 03/08/2019 CLINICAL DATA:  Swelling and bruising of the right upper extremity. Possible hematoma. EXAM: ULTRASOUND right UPPER EXTREMITY LIMITED TECHNIQUE: Ultrasound examination of the upper extremity soft tissues was performed in the area of clinical concern. COMPARISON:  None. FINDINGS: Diffuse subcutaneous soft tissue swelling/edema and streaky fluid. No discrete fluid collection or hematoma. The underlying musculature is grossly normal. IMPRESSION: Diffuse subcutaneous soft tissue swelling/edema/streaky fluid but no discrete abscess or hematoma. Electronically Signed   By: Marijo Sanes M.D.   On: 03/08/2019 19:13    Assessment and plan-  Patient is a 64 y.o. male with history of stage IV lung cancer, BRAF mutation on targeted therapy, hypertension, hyperlipidemia, CAD with history of CABG, diabetes, anxiety presented to emergency room due to breathing difficulties.he was found have DKA, AKI, mental status change.  #  Stage IV lung adenocarcinoma, patient has [id number] E mutation, and had been on dabrafenib and trametinib prior to admission.  He has had excellent response to targeted therapy which was documented on CT scan on 01/22/2019.  #Altered mental status, mental status has improved. #Acute renal failure, ATN, creatinine continues to rise.  Nephrology following up. Nephrology recommendation was reviewed.  #Acute NSTEMI /atrial fibrillation with RVR, cardiology on board.  Acute CHF, ejection fraction decreased to 30%.  He is currently on heparin infusion.  A. fib, rate controlled.  On amiodarone.  Managed by cardiology. Patient has history of CAD/CABG.  Dabrafenib may have further contributed to his CHF. Hold dabrafenib and trametinib.  Right upper  extremity tenderness, bruising, and obtain ultrasound soft tissue to rule out hematoma. #Microcytic anemia, hemoglobin has trended down to 9.2.  MCV decreased to 77.  Possible iron deficiency Check iron panel.   CODE STATUS, DNR/DNI. Thank you for allowing me to participate in the care of this patient.   Earlie Server, MD, PhD Hematology Oncology [facility] at [facility] Pager- [phone] 03/08/2019

## 2019-03-08 NOTE — Progress Notes (Signed)
[place]  Telephone:([phone] [phone]   Name: Craig Koch Date: 03/08/2019 MRN: [id number]  DOB: [birth year]  Patient Care Team: Tonia Ghent, MD as PCP - General (Family Medicine) Thelma Comp, [place] (Optometry) Telford Nab, RN as Registered Nurse    REASON FOR CONSULTATION: Craig Koch is a 64 y.o. male with multiple medical problems including stage IV adenocarcinoma of the lung with history of left sided malignant pleural effusion requiring Pleurx (later removed). Patient has opted not to pursue chemotherapy and has instead been treated on immunotherapy. Patient has had persistent shortness of breath. CTA of the chest on 11/02/2018 revealed marked increase in tumor encasing the entire left lung with new diffuse increased interstitial thickening concerning for disease progression and lymphangitic spread of disease.   Repeat CT of the chest and abdomen on 01/22/2019 revealed significant interval decrease of tumor burden on the lung.  Patient was admitted to the hospital on 03/03/2019 with shortness of breath and weakness.  He was found to have non-STEMI and DKA.  Patient was referred to palliative care to help address goals and manage ongoing symptoms.   CODE STATUS: DNR  PAST MEDICAL HISTORY: Past Medical History:  Diagnosis Date   Anxiety    Arthritis    Coronary artery disease    Diabetes mellitus    Dyslipidemia    Hx of CABG    Hyperlipidemia    Hypertension    Malignant neoplasm of unspecified part of unspecified bronchus or lung ([place]) 08/2018   Immunotherapy   Pleural effusion     PAST SURGICAL HISTORY:  Past Surgical History:  Procedure Laterality Date   CATARACT EXTRACTION Left 09/2015   CHEST TUBE INSERTION Left 10/01/2018   Procedure: INSERTION PLEURAL DRAINAGE CATHETER;  Surgeon: [name], MD;  Location: [facility] ORS;  Service: Thoracic;  Laterality: Left;   CORONARY  ARTERY BYPASS GRAFT  2004   (CABG with LIMA to the  LAD, SVG to OM2/OM3, SVG  to diag   Left ankle surgery     repair of fracture   Right lower leg surgery     rod    HEMATOLOGY/ONCOLOGY HISTORY:  Oncology History  lung adenocarcinoma  09/08/2018 Initial Diagnosis   lung adenocarcinoma   10/19/2018 -  Chemotherapy   The patient had pembrolizumab (KEYTRUDA) 200 mg in sodium chloride 0.9 % 50 mL chemo infusion, 200 mg, Intravenous, Once, 2 of 8 cycles Administration: 200 mg (10/19/2018), 200 mg (11/09/2018)  for chemotherapy treatment.      ALLERGIES:  is allergic to lipitor [atorvastatin calcium].  MEDICATIONS:  Current Facility-Administered Medications  Medication Dose Route Frequency Provider Last Rate Last Admin   acetaminophen (TYLENOL) tablet 650 mg  650 mg Oral Q6H PRN Ivor Costa, MD       albuterol (PROVENTIL) (2.5 MG/3ML) 0.083% nebulizer solution 3 mL  3 mL Inhalation Q4H PRN Ivor Costa, MD       amiodarone (PACERONE) tablet 400 mg  400 mg Oral BID Dunn, Ryan M, PA-C   400 mg at 03/08/19 0915   aspirin EC tablet 81 mg  81 mg Oral Daily Kate Sable, MD   81 mg at 03/08/19 0915   chlorhexidine (PERIDEX) 0.12 % solution 15 mL  15 mL Mouth Rinse BID Darel Hong D, NP   15 mL at 03/08/19 0867   Chlorhexidine Gluconate Cloth 2 % PADS 6 each  6 each Topical Daily Bradly Bienenstock, NP  dextromethorphan-guaiFENesin ([place] DM) 30-600 MG per 12 hr tablet 1 tablet  1 tablet Oral BID Ivor Costa, MD   1 tablet at 03/08/19 0915   dextrose 5 %-0.9 % sodium chloride infusion   Intravenous Continuous Dhungel, Nishant, MD 75 mL/hr at 03/08/19 0914 New Bag at 03/08/19 0914   dextrose 50 % solution 0-50 mL  0-50 mL Intravenous PRN Ivor Costa, MD   25 mL at 03/08/19 0355   diltiazem (CARDIZEM) 125 mg in dextrose 5% 125 mL (1 mg/mL) infusion  5-15 mg/hr Intravenous Titrated Ivor Costa, MD 10 mL/hr at 03/03/19 1815 10 mg/hr at 03/03/19 1815   dronabinol (MARINOL)  capsule 5 mg  5 mg Oral BID Renella Cunas, MD   5 mg at 03/08/19 0915   DULoxetine (CYMBALTA) DR capsule 30 mg  30 mg Oral Daily Ivor Costa, MD   30 mg at 03/08/19 0916   heparin ADULT infusion 100 units/mL (25000 units/252mL sodium chloride 0.45%)  2,600 Units/hr Intravenous Continuous Rocky Morel, RPH 26 mL/hr at 03/08/19 1026 2,600 Units/hr at 03/08/19 1026   insulin aspart (novoLOG) injection 0-15 Units  0-15 Units Subcutaneous Q4H Ralene Muskrat B, MD   2 Units at 03/07/19 7824   ipratropium-albuterol (DUONEB) 0.5-2.5 (3) MG/3ML nebulizer solution 3 mL  3 mL Nebulization TID Dhungel, Nishant, MD   3 mL at 03/08/19 0725   MEDLINE mouth rinse  15 mL Mouth Rinse q12n4p Darel Hong D, NP   15 mL at 03/07/19 1639   metoprolol tartrate (LOPRESSOR) tablet 12.5 mg  12.5 mg Oral BID Christell Faith M, PA-C   12.5 mg at 03/08/19 2353   multivitamin with minerals tablet 1 tablet  1 tablet Oral Daily Ivor Costa, MD   1 tablet at 03/08/19 0915   nicotine (NICODERM CQ - dosed in mg/24 hours) patch 21 mg  21 mg Transdermal Daily Ivor Costa, MD   21 mg at 03/07/19 0846   ondansetron (ZOFRAN) injection 4 mg  4 mg Intravenous Q8H PRN Ivor Costa, MD       piperacillin-tazobactam (ZOSYN) IVPB 3.375 g  3.375 g Intravenous Q12H Dallie Piles, RPH 12.5 mL/hr at 03/08/19 1022 3.375 g at 03/08/19 1022   pneumococcal 23 valent vaccine (PNEUMOVAX-23) injection 0.5 mL  0.5 mL Intramuscular Prior to discharge Ralene Muskrat B, MD       polyethylene glycol (MIRALAX / GLYCOLAX) packet 17 g  17 g Oral Daily Lang Snow, NP   17 g at 03/08/19 6144   protein supplement (ENSURE MAX) liquid  11 oz Oral BID BM Dhungel, Nishant, MD   11 oz at 03/08/19 1012   rosuvastatin (CRESTOR) tablet 10 mg  10 mg Oral Daily Ivor Costa, MD   10 mg at 03/08/19 3154   thiamine (B-1) 008 mg, folic acid 1 mg in sodium chloride 0.9 % 50 mL injection   Intravenous Q24H Dallie Piles, [place] at 03/07/19 2024     vancomycin variable dose per unstable renal function (pharmacist dosing)   Does not apply See admin instructions Dallie Piles, [facility]        VITAL SIGNS: BP 115/66 (BP Location: Left Arm)    Pulse 68    Temp 98 F (36.7 C)    Resp 16    Ht 6' (1.829 m)    Wt 184 lb 15.5 oz (83.9 kg)    SpO2 99%    BMI 25.09 kg/m  Filed Weights   03/03/19 1328 03/03/19  2000  Weight: 190 lb (86.2 kg) 184 lb 15.5 oz (83.9 kg)    Estimated body mass index is 25.09 kg/m as calculated from the following:   Height as of this encounter: 6' (1.829 m).   Weight as of this encounter: 184 lb 15.5 oz (83.9 kg).  LABS: CBC:    Component Value Date/Time   WBC 7.9 03/08/2019 0707   HGB 9.2 (L) 03/08/2019 0707   HCT 26.2 (L) 03/08/2019 0707   PLT 256 03/08/2019 0707   MCV 77.1 (L) 03/08/2019 0707   NEUTROABS 7.3 03/03/2019 1330   LYMPHSABS 0.8 03/03/2019 1330   MONOABS 0.7 03/03/2019 1330   EOSABS 0.0 03/03/2019 1330   BASOSABS 0.1 03/03/2019 1330   Comprehensive Metabolic Panel:    Component Value Date/Time   NA 132 (L) 03/08/2019 0707   K 3.5 03/08/2019 0707   CL 105 03/08/2019 0707   CO2 13 (L) 03/08/2019 0707   BUN 92 (H) 03/08/2019 0707   CREATININE 5.94 (H) 03/08/2019 0707   GLUCOSE 59 (L) 03/08/2019 0707   CALCIUM 7.9 (L) 03/08/2019 0707   AST 136 (H) 03/03/2019 1750   ALT 25 03/03/2019 1750   ALKPHOS 104 03/03/2019 1750   BILITOT 2.4 (H) 03/03/2019 1750   PROT 7.5 03/03/2019 1750   ALBUMIN 3.2 (L) 03/03/2019 1750    RADIOGRAPHIC STUDIES: CT HEAD WO CONTRAST  Result Date: 03/05/2019 CLINICAL DATA:  Metastatic disease evaluation.  Lung cancer. EXAM: CT HEAD WITHOUT CONTRAST TECHNIQUE: Contiguous axial images were obtained from the base of the skull through the vertex without intravenous contrast. COMPARISON:  MRI head 09/20/2018 FINDINGS: Brain: Mild atrophy. Mild hypodensity in the white matter unchanged from the prior MRI. No acute infarct, hemorrhage, mass. No edema or midline shift.  Vascular: Negative for hyperdense vessel Skull: Negative skull. Sinuses/Orbits: Mucosal edema and bony thickening right maxillary sinus. Chronic nasal bone fracture. Left cataract extraction. Other: None IMPRESSION: No acute abnormality and negative for metastatic disease on unenhanced CT. Atrophy and mild chronic microvascular ischemia. Electronically Signed   By: Franchot Gallo M.D.   On: 03/05/2019 15:28   US RENAL  Result Date: 03/04/2019 CLINICAL DATA:  Acute kidney injury EXAM: RENAL / URINARY TRACT ULTRASOUND COMPLETE COMPARISON:  CT 01/22/2019 FINDINGS: Right Kidney: Renal measurements: 11.4 x 6 x 6 cm = volume: 215 mL . Echogenicity within normal limits. No mass or hydronephrosis visualized. Left Kidney: Renal measurements: 11.8 x 7.3 x 6.5 cm = volume: 291.8 mL. Echogenicity within normal limits. No mass or hydronephrosis visualized. Bladder: Appears normal for degree of bladder distention. Other: Incidental note made of gallstones and small effusions. Heterogenous prostate. IMPRESSION: Normal ultrasound appearance of the kidneys Electronically Signed   By: Donavan Foil M.D.   On: 03/04/2019 15:37   DG Chest Portable 1 View  Result Date: 03/03/2019 CLINICAL DATA:  Shortness of breath, since Monday worsening this morning around 1 a.m. EXAM: PORTABLE CHEST 1 VIEW COMPARISON:  12/19/2018 FINDINGS: Cardiomediastinal contours are stable following median sternotomy with persistent left-sided pleural effusion and pleural-parenchymal scarring. No new areas of consolidation or evidence of right-sided pleural effusion. No signs of acute bone process. IMPRESSION: Persistent left-sided pleural effusion and pleural-parenchymal scarring. Electronically Signed   By: Zetta Bills M.D.   On: 03/03/2019 13:59   ECHOCARDIOGRAM COMPLETE  Result Date: 03/04/2019    ECHOCARDIOGRAM REPORT   Patient Name:   Craig Koch Date of Exam: 03/04/2019 Medical Rec #:  [id number]       Height:  72.0 in Accession #:     [id number]      Weight:       185.0 lb Date of Birth:  [birth year]       BSA:          2.061 m Patient Age:    65 years        BP:           125/72 mmHg Patient Gender: M               HR:           119 bpm. Exam Location:  [facility] Procedure: 2D Echo, Color Doppler and Cardiac Doppler Indications:     I21.4 NSTEMI  History:         Patient has prior history of Echocardiogram examinations, most                  recent 08/24/2018. CAD, Prior CABG; Risk Factors:Hypertension,                  Dyslipidemia and Diabetes. Lung Cancer.  Sonographer:     Charmayne Sheer RDCS (AE) Referring Phys:  [place] Phys: Kathlyn Sacramento MD IMPRESSIONS  1. Left ventricular ejection fraction, by estimation, is 30 to 35%. The left ventricle has moderately decreased function. The left ventricle demonstrates global hypokinesis. Left ventricular diastolic parameters are indeterminate.  2. Right ventricular systolic function is normal. The right ventricular size is normal. Tricuspid regurgitation signal is inadequate for assessing PA pressure.  3. Left atrial size was mild to moderately dilated.  4. The mitral valve is normal in structure and function. Moderate mitral valve regurgitation. No evidence of mitral stenosis.  5. The aortic valve is normal in structure and function. Aortic valve regurgitation is trivial. Mild to moderate aortic valve sclerosis/calcification is present, without any evidence of aortic stenosis.  6. The inferior vena cava is normal in size with greater than 50% respiratory variability, suggesting right atrial pressure of 3 mmHg. FINDINGS  Left Ventricle: Left ventricular ejection fraction, by estimation, is 30 to 35%. The left ventricle has moderately decreased function. The left ventricle demonstrates global hypokinesis. The left ventricular internal cavity size was normal in size. There is no left ventricular hypertrophy. Left ventricular diastolic parameters are indeterminate. Right Ventricle: The  right ventricular size is normal. No increase in right ventricular wall thickness. Right ventricular systolic function is normal. Tricuspid regurgitation signal is inadequate for assessing PA pressure. Left Atrium: Left atrial size was mild to moderately dilated. Right Atrium: Right atrial size was normal in size. Pericardium: There is no evidence of pericardial effusion. Mitral Valve: The mitral valve is normal in structure and function. Normal mobility of the mitral valve leaflets. Moderate mitral valve regurgitation. No evidence of mitral valve stenosis. MV peak gradient, 7.5 mmHg. The mean mitral valve gradient is 4.0  mmHg. Tricuspid Valve: The tricuspid valve is normal in structure. Tricuspid valve regurgitation is not demonstrated. No evidence of tricuspid stenosis. Aortic Valve: The aortic valve is normal in structure and function. Aortic valve regurgitation is trivial. Aortic regurgitation PHT measures 281 msec. Mild to moderate aortic valve sclerosis/calcification is present, without any evidence of aortic stenosis. Aortic valve mean gradient measures 5.0 mmHg. Aortic valve peak gradient measures 8.5 mmHg. Aortic valve area, by VTI measures 2.53 cm. Pulmonic Valve: The pulmonic valve was normal in structure. Pulmonic valve regurgitation is not visualized. No evidence of pulmonic stenosis. Aorta: The aortic root is normal in  size and structure. Venous: The inferior vena cava is normal in size with greater than 50% respiratory variability, suggesting right atrial pressure of 3 mmHg. IAS/Shunts: No atrial level shunt detected by color flow Doppler.  LEFT VENTRICLE PLAX 2D LVIDd:         5.21 cm  Diastology LVIDs:         4.21 cm  LV e' lateral:   7.83 cm/s LV PW:         0.84 cm  LV E/e' lateral: 13.4 LV IVS:        0.89 cm  LV e' medial:    7.94 cm/s LVOT diam:     2.40 cm  LV E/e' medial:  13.2 LV SV:         54 LV SV Index:   26 LVOT Area:     4.52 cm  RIGHT VENTRICLE RV Basal diam:  3.08 cm LEFT ATRIUM              Index       RIGHT ATRIUM           Index LA diam:        4.70 cm 2.28 cm/m  RA Area:     14.10 cm LA Vol (A2C):   62.0 ml 30.08 ml/m RA Volume:   35.20 ml  17.08 ml/m LA Vol (A4C):   68.6 ml 33.29 ml/m LA Biplane Vol: 65.3 ml 31.68 ml/m  AORTIC VALVE                    PULMONIC VALVE AV Area (Vmax):    2.95 cm     PV Vmax:       1.10 m/s AV Area (Vmean):   2.86 cm     PV Vmean:      68.400 cm/s AV Area (VTI):     2.53 cm     PV VTI:        0.146 m AV Vmax:           146.00 cm/s  PV Peak grad:  4.8 mmHg AV Vmean:          103.000 cm/s PV Mean grad:  2.0 mmHg AV VTI:            0.213 m AV Peak Grad:      8.5 mmHg AV Mean Grad:      5.0 mmHg LVOT Vmax:         95.30 cm/s LVOT Vmean:        65.200 cm/s LVOT VTI:          0.119 m LVOT/AV VTI ratio: 0.56 AI PHT:            281 msec  AORTA Ao Root diam: 3.00 cm MITRAL VALVE MV Area (PHT): 3.92 cm     SHUNTS MV Peak grad:  7.5 mmHg     Systemic VTI:  0.12 m MV Mean grad:  4.0 mmHg     Systemic Diam: 2.40 cm MV Vmax:       1.37 m/s MV Vmean:      92.7 cm/s MV Decel Time: 194 msec MV E velocity: 104.80 cm/s Kathlyn Sacramento MD Electronically signed by Kathlyn Sacramento MD Signature Date/Time: 03/04/2019/2:46:43 PM    Final     PERFORMANCE STATUS (ECOG) : 3 - Symptomatic, >50% confined to bed  Review of Systems Unless otherwise noted, a complete review of systems is negative.  Physical Exam General: NAD Pulmonary: unlabored Abdomen: soft,  nontender, + bowel sounds GU: no suprapubic tenderness Extremities: no edema, no joint deformities Skin: no rashes Neurological: Weakness  IMPRESSION: Patient is much more clear cognitively today.  He is currently oriented, knows he is in the hospital and why.  Patient says that he spoke with nephrology this morning and was told that hemodialysis may need to be considered.  Patient says that that would be okay with him if necessary.  Discussed with hospitalist.  PLAN: -Continue current scope of  treatment -DNR/DNI -Will follow.     Time Total: 15 minutes  Visit consisted of counseling and education dealing with the complex and emotionally intense issues of symptom management and palliative care in the setting of serious and potentially life-threatening illness.Greater than 50%  of this time was spent counseling and coordinating care related to the above assessment and plan.  Signed by: Altha Harm, PhD, NP-C

## 2019-03-08 NOTE — Consult Note (Signed)
[place] for Heparin  Indication: atrial fibrillation- new onset  Patient Measurements: Height: 6' (182.9 cm) Weight: 184 lb 15.5 oz (83.9 kg) IBW/kg (Calculated) : 77.6 Heparin Dosing Weight: 86.2 kg   Vital Signs: Temp: 98 F (36.7 C) (03/05 0755) Temp Source: Oral (03/05 0639) BP: 115/66 (03/05 0755) Pulse Rate: 68 (03/05 0755)  Labs: Recent Labs    03/06/19 0152 03/06/19 0855 03/06/19 1449 03/07/19 0310 03/07/19 8786 03/07/19 1856 03/08/19 0707  HGB 11.0*  --   --  10.2*  --   --  9.2*  HCT 31.0*  --   --  28.3*  --   --  26.2*  PLT 204  --   --  244  --   --  256  HEPARINUNFRC 0.23*   < >   < >  --  <0.10* 0.14* <0.10*  CREATININE 4.22*  --   --  5.25*  --   --  5.94*   < > = values in this interval not displayed.    Estimated Creatinine Clearance: 14 mL/min (A) (by C-G formula based on SCr of 5.94 mg/dL (H)).   Medications:  Called RN to confirm whether pt has been on [facility] prior to admission. Nurse was not able to confirm as she was not in the patient's room. Per chart review, no AC prior to admission and confirmed with pharmacy technician who spoke to patient.   Heparin Course: 02/28 initiation: 1150 units/hr 02/28 2350 HL 0.26: inc to 1300 units/hr 03/01 0530 HL 0.31: no change 03/01 1059 HL 0.24: inc to 1450 units/hr 03/01 1849 HL 0.28: inc to 1550 units/hr 03/02 0153 HL 0.33: no change 03/02 0758 HL 0.28: inc to 1700 units/hr 03/02 1743 HL 0.30: no change 03/03 0152 HL 0.23: inc to 1950 units/hr 03/03 1449 HL 0.32: no change 03/04 0633 HL <0.10: inc to 2200 units/hr 03/04 1856 HL 0.14: 2000 unit bolus, incto 2400 units/hr.   Assessment: Craig Koch is a 64 y.o. male with a past medical history of anxiety, CAD, diabetes, and hyperlipidemia, hypertension, lung cancer, presents to the emergency department for shortness of breath. Pharmacy was consulted for heparin dosing in a patient with new onset atrial  fibrillation. H&H trending down slightly, platelets are stable  Goal of Therapy:  Heparin level 0.3-0.7 units/ml Monitor platelets by anticoagulation protocol: Yes   Plan:  3/5 0707 HL <0.10 subtherapeutic. RN confirms heparin infusing at 24 ml/hr with no line issues; no s/sx of bleeding. RN stated she will also recheck line when she next goes back into room. Will order Heparin 2500 unit bolus followed by increase in heparin drip to 2600 units/hr. Recheck HL in 8h.  CBC daily.  Difficulty in getting HL to goal and to keep therapeutic. Will order antithrombin III level. Per lab, antithrombin III is a send out to [facility], may be able to add in on to AM labs from today.   Pharmacy will continue to monitor and adjust per consult.   Rocky Morel, PharmD, BCPS 03/08/2019 9:58 AM

## 2019-03-09 LAB — BASIC METABOLIC PANEL
Anion gap: 12 (ref 5–15)
BUN: 98 mg/dL — ABNORMAL HIGH (ref 8–23)
CO2: 15 mmol/L — ABNORMAL LOW (ref 22–32)
Calcium: 7.9 mg/dL — ABNORMAL LOW (ref 8.9–10.3)
Chloride: 105 mmol/L (ref 98–111)
Creatinine, Ser: 5.94 mg/dL — ABNORMAL HIGH (ref 0.61–1.24)
GFR calc Af Amer: 11 mL/min — ABNORMAL LOW (ref >60)
GFR calc non Af Amer: 9 mL/min — ABNORMAL LOW (ref >60)
Glucose, Bld: 90 mg/dL (ref 70–99)
Potassium: 4 mmol/L (ref 3.5–5.1)
Sodium: 132 mmol/L — ABNORMAL LOW (ref 135–145)

## 2019-03-09 LAB — CBC
HCT: 26.7 % — ABNORMAL LOW (ref 39.0–52.0)
Hemoglobin: 9 g/dL — ABNORMAL LOW (ref 13.0–17.0)
MCH: 27.4 pg (ref 26.0–34.0)
MCHC: 33.7 g/dL (ref 30.0–36.0)
MCV: 81.4 fL (ref 80.0–100.0)
Platelets: 237 10*3/uL (ref 150–400)
RBC: 3.28 MIL/uL — ABNORMAL LOW (ref 4.22–5.81)
RDW: 19.9 % — ABNORMAL HIGH (ref 11.5–15.5)
WBC: 7.7 10*3/uL (ref 4.0–10.5)
nRBC: 0 % (ref 0.0–0.2)

## 2019-03-09 LAB — GLUCOSE, CAPILLARY
Glucose-Capillary: 106 mg/dL — ABNORMAL HIGH (ref 70–99)
Glucose-Capillary: 170 mg/dL — ABNORMAL HIGH (ref 70–99)
Glucose-Capillary: 195 mg/dL — ABNORMAL HIGH (ref 70–99)
Glucose-Capillary: 197 mg/dL — ABNORMAL HIGH (ref 70–99)
Glucose-Capillary: 205 mg/dL — ABNORMAL HIGH (ref 70–99)
Glucose-Capillary: 76 mg/dL (ref 70–99)
Glucose-Capillary: 91 mg/dL (ref 70–99)

## 2019-03-09 LAB — RETIC PANEL
Immature Retic Fract: 7.8 % (ref 2.3–15.9)
RBC.: 3.32 MIL/uL — ABNORMAL LOW (ref 4.22–5.81)
Retic Count, Absolute: 9.6 10*3/uL — ABNORMAL LOW (ref 19.0–186.0)
Retic Ct Pct: 0.3 % — ABNORMAL LOW (ref 0.4–3.1)
Reticulocyte Hemoglobin: 32 pg (ref >27.9)

## 2019-03-09 LAB — FERRITIN: Ferritin: 676 ng/mL — ABNORMAL HIGH (ref 24–336)

## 2019-03-09 LAB — IRON AND TIBC
Iron: 19 ug/dL — ABNORMAL LOW (ref 45–182)
Saturation Ratios: 10 % — ABNORMAL LOW (ref 17.9–39.5)
TIBC: 183 ug/dL — ABNORMAL LOW (ref 250–450)
UIBC: 164 ug/dL

## 2019-03-09 LAB — HEPARIN LEVEL (UNFRACTIONATED)
Heparin Unfractionated: 0.96 IU/mL — ABNORMAL HIGH (ref 0.30–0.70)
Heparin Unfractionated: 1.33 IU/mL — ABNORMAL HIGH (ref 0.30–0.70)

## 2019-03-09 MED ORDER — HEPARIN (PORCINE) 25000 UT/250ML-% IV SOLN
1950.0000 [IU]/h | INTRAVENOUS | Status: DC
  Administered 2019-03-09 (×2): 1950 [IU]/h via INTRAVENOUS
  Filled 2019-03-09: qty 250

## 2019-03-09 MED ORDER — SODIUM BICARBONATE-DEXTROSE 150-5 MEQ/L-% IV SOLN
150.0000 meq | INTRAVENOUS | Status: DC
  Administered 2019-03-09 – 2019-03-11 (×4): 150 meq via INTRAVENOUS
  Filled 2019-03-09 (×5): qty 1000

## 2019-03-09 NOTE — Progress Notes (Signed)
PROGRESS NOTE                                                                                                                                                                                                             Patient Demographics:    Craig Koch, is a 64 y.o. male, DOB - [birth year], WJX:[id number]  Admit date - 03/03/2019   Admitting Physician Ivor Costa, MD  Outpatient Primary MD for the patient is Tonia Ghent, MD  LOS - 6  Outpatient Specialists: Oncology  Chief Complaint  Patient presents with  . Shortness of Breath       Brief Narrative 64 year old male with hypertension, hyperlipidemia, type 2 diabetes mellitus, anxiety depression, stage IV lung cancer, CAD with history of CABG, tobacco and alcohol use who presented with generalized weakness and shortness of breath.  In the ED he was found to be in DKA, AKI with creatinine of 1.6, new onset A. fib with RVR.  Required BiPAP and admission to stepdown unit. Hospital course prolonged due to persistent encephalopathy, rapid A. fib,?  Occult bacteremia and ATN.   Subjective:   Reports having some difficulty breathing.  No other overnight events.   Assessment  & Plan :    Principal Problem: New onset atrial fibrillation with RVR ([place]) Now converted to normal sinus.   Cardiology recommends to continue amiodarone 40 mg twice daily for 5 days and then reduce to 200 mg twice daily. Continue IV heparin.  Given his poor renal function cannot transition to Eliquis or Xarelto yet. Metoprolol dose reduced to 12.5 mg twice daily given low blood pressure.    Active problems Acute tubular necrosis Suspect prerenal with hypovolemia from ketoacidosis.. Creatinine still elevated with low GFR.  Urine output 550 cc.  Holding off on dialysis but may need in the next few days. IV fluids discontinued due to shortness of breath.   Acute metabolic encephalopathy Now  resolved.  Was on antibiotic for possible underlying infection, now discontinued.  Acute systolic CHF (EF 21-30%) NSTEMI Presented with markedly elevated troponin and suspect NSTEMI with respiratory failure and DKA. Continue IV heparin, beta-blocker and statin.  Metoprolol dose reduced.  Further ischemic evaluation upon resolution of acute issues. IV fluids discontinued due to mild CHF symptoms today.  Continue to monitor with strict I's/O and daily weight.  Uncontrolled type  II diabetes mellitus with DKA DKA resolved on insulin drip.  Patient became hypoglycemic on 3/5 possibly due to increased dose of Lantus and worsened renal function.  Was placed on D5, now discontinued due to volume overload.  Alcohol abuse Encephalopathy could be contributed by withdrawal symptoms earlier.  Resolved.  Tobacco abuse Nicotine patch.   Stage IV lung adenocarcinoma Dr. Tasia Catchings following him in the hospital.  Recommend to hold dabrafenib and trametinib.  Iron deficiency anemia We will plan on iron infusion once dyspnea better.  Hypothermia Secondary to DKA.  Resolved    Code Status : DNR (being followed by palliative care)  Family Communication  : Wife involved in care  Disposition Plan  : Needs SNF once renal function improved and IV heparin transition to oral anticoagulant.  Barriers For Discharge : Ongoing several medical issues.  (Need for IV heparin, progressive ATN, hypo and hyperglycemia, CHF)  Consults  : Cardiology, nephrology, critical care, oncology  Procedures  : 2D echo, head CT  DVT Prophylaxis  : IV heparin  Lab Results  Component Value Date   PLT 237 03/09/2019    Antibiotics  :    Anti-infectives (From admission, onward)   Start     Dose/Rate Route Frequency Ordered Stop   03/07/19 1200  vancomycin (VANCOCIN) IVPB 1000 mg/200 mL premix     1,000 mg 200 mL/hr over 60 Minutes Intravenous  Once 03/07/19 0801 03/07/19 2024   03/06/19 2200  piperacillin-tazobactam  (ZOSYN) IVPB 3.375 g  Status:  Discontinued     3.375 g 12.5 mL/hr over 240 Minutes Intravenous Every 12 hours 03/06/19 0858 03/08/19 1408   03/05/19 1420  vancomycin variable dose per unstable renal function (pharmacist dosing)  Status:  Discontinued      Does not apply See admin instructions 03/05/19 1420 03/08/19 1408   03/05/19 1200  vancomycin (VANCOREADY) IVPB 1500 mg/300 mL     1,500 mg 150 mL/hr over 120 Minutes Intravenous  Once 03/05/19 0944 03/06/19 2324   03/05/19 1100  piperacillin-tazobactam (ZOSYN) IVPB 3.375 g  Status:  Discontinued     3.375 g 12.5 mL/hr over 240 Minutes Intravenous Every 8 hours 03/05/19 0944 03/06/19 0858        Objective:   Vitals:   03/09/19 0437 03/09/19 0752 03/09/19 0753 03/09/19 1137  BP: 108/70  109/66 105/64  Pulse: 68  72 69  Resp: 20  18 16   Temp: 98.6 F (37 C)  97.9 F (36.6 C)   TempSrc: Oral     SpO2: 98% 100% 100% 100%  Weight: 92.3 kg     Height:        Wt Readings from Last 3 Encounters:  03/09/19 92.3 kg  02/14/19 86.6 kg  02/08/19 86.9 kg     Intake/Output Summary (Last 24 hours) at 03/09/2019 1446 Last data filed at 03/09/2019 0438 Gross per 24 hour  Intake 1373.82 ml  Output 200 ml  Net 1173.82 ml   Physical exam Fatigue HEENT: Moist mucous, supple neck Chest: Scattered rhonchi bilaterally CVs: Normal S1-S2 GI: Soft, nondistended, nontender Musculoskeletal: Warm, no edema     Data Review:    CBC Recent Labs  Lab 03/03/19 1330 03/03/19 2359 03/05/19 0153 03/06/19 0152 03/07/19 0310 03/08/19 0707 03/09/19 0356  WBC 9.4   < > 8.5 7.8 6.3 7.9 7.7  HGB 13.4   < > 11.1* 11.0* 10.2* 9.2* 9.0*  HCT 42.9   < > 31.1* 31.0* 28.3* 26.2* 26.7*  PLT 232   < >  189 204 244 256 237  MCV 87.4   < > 77.8* 77.1* 75.7* 77.1* 81.4  MCH 27.3   < > 27.8 27.4 27.3 27.1 27.4  MCHC 31.2   < > 35.7 35.5 36.0 35.1 33.7  RDW 16.8*   < > 17.1* 17.4* 17.5* 18.4* 19.9*  LYMPHSABS 0.8  --   --   --   --   --   --   MONOABS  0.7  --   --   --   --   --   --   EOSABS 0.0  --   --   --   --   --   --   BASOSABS 0.1  --   --   --   --   --   --    < > = values in this interval not displayed.    Chemistries  Recent Labs  Lab 03/03/19 1547 03/03/19 1547 03/03/19 1750 03/03/19 1936 03/05/19 0153 03/06/19 0152 03/07/19 0310 03/08/19 0707 03/09/19 0356  NA 123*   < > 123*   < > 128* 129* 130* 132* 132*  K 5.1   < > 5.4*   < > 3.5 3.3* 3.3* 3.5 4.0  CL 89*   < > 89*   < > 102 102 101 105 105  CO2 <7*   < > 8*   < > 15* 14* 16* 13* 15*  GLUCOSE 718*   < > 756*   < > 311* 182* 144* 59* 90  BUN 37*   < > 43*   < > 65* 75* 84* 92* 98*  CREATININE 1.60*   < > 1.79*   < > 3.16* 4.22* 5.25* 5.94* 5.94*  CALCIUM 8.2*   < > 8.2*   < > 8.0* 8.2* 8.2* 7.9* 7.9*  MG  --   --  2.3  --  1.9 1.9  --   --   --   AST 81*  --  136*  --   --   --   --   --   --   ALT 18  --  25  --   --   --   --   --   --   ALKPHOS 80  --  104  --   --   --   --   --   --   BILITOT 2.3*  --  2.4*  --   --   --   --   --   --    < > = values in this interval not displayed.   ------------------------------------------------------------------------------------------------------------------ No results for input(s): CHOL, HDL, LDLCALC, TRIG, CHOLHDL, LDLDIRECT in the last 72 hours.  Lab Results  Component Value Date   HGBA1C 11.3 (H) 03/04/2019   ------------------------------------------------------------------------------------------------------------------ No results for input(s): TSH, T4TOTAL, T3FREE, THYROIDAB in the last 72 hours.  Invalid input(s): FREET3 ------------------------------------------------------------------------------------------------------------------ Recent Labs    03/09/19 0009 03/09/19 0356  FERRITIN 676*  --   TIBC 183*  --   IRON 19*  --   RETICCTPCT  --  0.3*    Coagulation profile Recent Labs  Lab 03/03/19 1750  INR 1.2    No results for input(s): DDIMER in the last 72 hours.  Cardiac  Enzymes No results for input(s): CKMB, TROPONINI, MYOGLOBIN in the last 168 hours.  Invalid input(s): CK ------------------------------------------------------------------------------------------------------------------    Component Value Date/Time   BNP 330.0 (H) 03/03/2019 1330    Inpatient Medications  Scheduled Meds: .  amiodarone  400 mg Oral BID  . aspirin EC  81 mg Oral Daily  . chlorhexidine  15 mL Mouth Rinse BID  . Chlorhexidine Gluconate Cloth  6 each Topical Daily  . dextromethorphan-guaiFENesin  1 tablet Oral BID  . dronabinol  5 mg Oral BID AC  . DULoxetine  30 mg Oral Daily  . insulin aspart  0-15 Units Subcutaneous Q4H  . ipratropium-albuterol  3 mL Nebulization TID  . mouth rinse  15 mL Mouth Rinse q12n4p  . metoprolol tartrate  12.5 mg Oral BID  . multivitamin with minerals  1 tablet Oral Daily  . nicotine  21 mg Transdermal Daily  . polyethylene glycol  17 g Oral Daily  . Ensure Max Protein  11 oz Oral BID BM  . rosuvastatin  10 mg Oral Daily  . sodium bicarbonate  1,300 mg Oral BID   Continuous Infusions: . diltiazem (CARDIZEM) infusion 10 mg/hr (03/03/19 1815)  . heparin 2,250 Units/hr (03/09/19 1014)  . sodium bicarbonate 150 mEq in dextrose 5% 1000 mL 150 mEq (03/09/19 1125)  . small volume/piggyback builder 50 mL/hr at 03/09/19 1014   PRN Meds:.acetaminophen, albuterol, dextrose, menthol-cetylpyridinium, ondansetron (ZOFRAN) IV, pneumococcal 23 valent vaccine  Micro Results Recent Results (from the past 240 hour(s))  Respiratory Panel by RT PCR (Flu A&B, Covid) - Nasopharyngeal Swab     Status: None   Collection Time: 03/03/19  3:31 PM   Specimen: Nasopharyngeal Swab  Result Value Ref Range Status   SARS Coronavirus 2 by RT PCR NEGATIVE NEGATIVE Final    Comment: (NOTE) SARS-CoV-2 target nucleic acids are NOT DETECTED. The SARS-CoV-2 RNA is generally detectable in upper respiratoy specimens during the acute phase of infection. The  lowest concentration of SARS-CoV-2 viral copies this assay can detect is 131 copies/mL. A negative result does not preclude SARS-Cov-2 infection and should not be used as the sole basis for treatment or other patient management decisions. A negative result may occur with  improper specimen collection/handling, submission of specimen other than nasopharyngeal swab, presence of viral mutation(s) within the areas targeted by this assay, and inadequate number of viral copies (<131 copies/mL). A negative result must be combined with clinical observations, patient history, and epidemiological information. The expected result is Negative. Fact Sheet for Patients:  PinkCheek.be Fact Sheet for Healthcare Providers:  GravelBags.it This test is not yet ap proved or cleared by the [place] FDA and  has been authorized for detection and/or diagnosis of SARS-CoV-2 by FDA under an Emergency Use Authorization (EUA). This EUA will remain  in effect (meaning this test can be used) for the duration of the COVID-19 declaration under Section 564(b)(1) of the Act, 21 U.S.C. section 360bbb-3(b)(1), unless the authorization is terminated or revoked sooner.    Influenza A by PCR NEGATIVE NEGATIVE Final   Influenza B by PCR NEGATIVE NEGATIVE Final    Comment: (NOTE) The Xpert Xpress SARS-CoV-2/FLU/RSV assay is intended as an aid in  the diagnosis of influenza from Nasopharyngeal swab specimens and  should not be used as a sole basis for treatment. Nasal washings and  aspirates are unacceptable for Xpert Xpress SARS-CoV-2/FLU/RSV  testing. Fact Sheet for Patients: PinkCheek.be Fact Sheet for Healthcare Providers: GravelBags.it This test is not yet approved or cleared by the [place] FDA and  has been authorized for detection and/or diagnosis of SARS-CoV-2 by  FDA under an Emergency  Use Authorization (EUA). This EUA will remain  in effect (meaning this test can be used) for  the duration of the  Covid-19 declaration under Section 564(b)(1) of the Act, 21  U.S.C. section 360bbb-3(b)(1), unless the authorization is  terminated or revoked. Performed at [facility], [address] [place], [place] [address]   MRSA PCR Screening     Status: None   Collection Time: 03/03/19  7:48 PM   Specimen: Nasopharyngeal  Result Value Ref Range Status   MRSA by PCR NEGATIVE NEGATIVE Final    Comment:        The GeneXpert MRSA Assay (FDA approved for NASAL specimens only), is one component of a comprehensive MRSA colonization surveillance program. It is not intended to diagnose MRSA infection nor to guide or monitor treatment for MRSA infections. Performed at [facility], [address] [place], [place] [address]   CULTURE, BLOOD (ROUTINE X 2) w Reflex to ID Panel     Status: None (Preliminary result)   Collection Time: 03/05/19 10:16 AM   Specimen: BLOOD  Result Value Ref Range Status   Specimen Description BLOOD LEFT ANTECUBITAL  Final   Special Requests   Final    BOTTLES DRAWN AEROBIC AND ANAEROBIC Blood Culture adequate volume   Culture   Final    NO GROWTH 4 DAYS Performed at [facility], [address], [place], [place] [address]    Report Status PENDING  Incomplete  CULTURE, BLOOD (ROUTINE X 2) w Reflex to ID Panel     Status: None (Preliminary result)   Collection Time: 03/05/19 10:22 AM   Specimen: BLOOD  Result Value Ref Range Status   Specimen Description BLOOD BLOOD LEFT HAND  Final   Special Requests   Final    BOTTLES DRAWN AEROBIC ONLY Blood Culture adequate volume   Culture   Final    NO GROWTH 4 DAYS Performed at [facility], [address] [place], [place] [address]    Report Status PENDING  Incomplete    Radiology Reports CT HEAD WO CONTRAST  Result Date: 03/05/2019 CLINICAL DATA:  Metastatic  disease evaluation.  Lung cancer. EXAM: CT HEAD WITHOUT CONTRAST TECHNIQUE: Contiguous axial images were obtained from the base of the skull through the vertex without intravenous contrast. COMPARISON:  MRI head 09/20/2018 FINDINGS: Brain: Mild atrophy. Mild hypodensity in the white matter unchanged from the prior MRI. No acute infarct, hemorrhage, mass. No edema or midline shift. Vascular: Negative for hyperdense vessel Skull: Negative skull. Sinuses/Orbits: Mucosal edema and bony thickening right maxillary sinus. Chronic nasal bone fracture. Left cataract extraction. Other: None IMPRESSION: No acute abnormality and negative for metastatic disease on unenhanced CT. Atrophy and mild chronic microvascular ischemia. Electronically Signed   By: Franchot Gallo M.D.   On: 03/05/2019 15:28   US RENAL  Result Date: 03/04/2019 CLINICAL DATA:  Acute kidney injury EXAM: RENAL / URINARY TRACT ULTRASOUND COMPLETE COMPARISON:  CT 01/22/2019 FINDINGS: Right Kidney: Renal measurements: 11.4 x 6 x 6 cm = volume: 215 mL . Echogenicity within normal limits. No mass or hydronephrosis visualized. Left Kidney: Renal measurements: 11.8 x 7.3 x 6.5 cm = volume: 291.8 mL. Echogenicity within normal limits. No mass or hydronephrosis visualized. Bladder: Appears normal for degree of bladder distention. Other: Incidental note made of gallstones and small effusions. Heterogenous prostate. IMPRESSION: Normal ultrasound appearance of the kidneys Electronically Signed   By: Donavan Foil M.D.   On: 03/04/2019 15:37   DG Chest Portable 1 View  Result Date: 03/03/2019 CLINICAL DATA:  Shortness of breath, since Monday worsening this morning around  1 a.m. EXAM: PORTABLE CHEST 1 VIEW COMPARISON:  12/19/2018 FINDINGS: Cardiomediastinal contours are stable following median sternotomy with persistent left-sided pleural effusion and pleural-parenchymal scarring. No new areas of consolidation or evidence of right-sided pleural effusion. No signs of  acute bone process. IMPRESSION: Persistent left-sided pleural effusion and pleural-parenchymal scarring. Electronically Signed   By: Zetta Bills M.D.   On: 03/03/2019 13:59   ECHOCARDIOGRAM COMPLETE  Result Date: 03/04/2019    ECHOCARDIOGRAM REPORT   Patient Name:   Craig Koch Date of Exam: 03/04/2019 Medical Rec #:  [id number]       Height:       72.0 in Accession #:    [id number]      Weight:       185.0 lb Date of Birth:  [birth year]       BSA:          2.061 m Patient Age:    7 years        BP:           125/72 mmHg Patient Gender: M               HR:           119 bpm. Exam Location:  [facility] Procedure: 2D Echo, Color Doppler and Cardiac Doppler Indications:     I21.4 NSTEMI  History:         Patient has prior history of Echocardiogram examinations, most                  recent 08/24/2018. CAD, Prior CABG; Risk Factors:Hypertension,                  Dyslipidemia and Diabetes. Lung Cancer.  Sonographer:     Charmayne Sheer RDCS (AE) Referring Phys:  [place] Phys: Kathlyn Sacramento MD IMPRESSIONS  1. Left ventricular ejection fraction, by estimation, is 30 to 35%. The left ventricle has moderately decreased function. The left ventricle demonstrates global hypokinesis. Left ventricular diastolic parameters are indeterminate.  2. Right ventricular systolic function is normal. The right ventricular size is normal. Tricuspid regurgitation signal is inadequate for assessing PA pressure.  3. Left atrial size was mild to moderately dilated.  4. The mitral valve is normal in structure and function. Moderate mitral valve regurgitation. No evidence of mitral stenosis.  5. The aortic valve is normal in structure and function. Aortic valve regurgitation is trivial. Mild to moderate aortic valve sclerosis/calcification is present, without any evidence of aortic stenosis.  6. The inferior vena cava is normal in size with greater than 50% respiratory variability, suggesting right atrial pressure of 3 mmHg.  FINDINGS  Left Ventricle: Left ventricular ejection fraction, by estimation, is 30 to 35%. The left ventricle has moderately decreased function. The left ventricle demonstrates global hypokinesis. The left ventricular internal cavity size was normal in size. There is no left ventricular hypertrophy. Left ventricular diastolic parameters are indeterminate. Right Ventricle: The right ventricular size is normal. No increase in right ventricular wall thickness. Right ventricular systolic function is normal. Tricuspid regurgitation signal is inadequate for assessing PA pressure. Left Atrium: Left atrial size was mild to moderately dilated. Right Atrium: Right atrial size was normal in size. Pericardium: There is no evidence of pericardial effusion. Mitral Valve: The mitral valve is normal in structure and function. Normal mobility of the mitral valve leaflets. Moderate mitral valve regurgitation. No evidence of mitral valve stenosis. MV peak gradient, 7.5 mmHg. The mean mitral valve gradient  is 4.0  mmHg. Tricuspid Valve: The tricuspid valve is normal in structure. Tricuspid valve regurgitation is not demonstrated. No evidence of tricuspid stenosis. Aortic Valve: The aortic valve is normal in structure and function. Aortic valve regurgitation is trivial. Aortic regurgitation PHT measures 281 msec. Mild to moderate aortic valve sclerosis/calcification is present, without any evidence of aortic stenosis. Aortic valve mean gradient measures 5.0 mmHg. Aortic valve peak gradient measures 8.5 mmHg. Aortic valve area, by VTI measures 2.53 cm. Pulmonic Valve: The pulmonic valve was normal in structure. Pulmonic valve regurgitation is not visualized. No evidence of pulmonic stenosis. Aorta: The aortic root is normal in size and structure. Venous: The inferior vena cava is normal in size with greater than 50% respiratory variability, suggesting right atrial pressure of 3 mmHg. IAS/Shunts: No atrial level shunt detected by color  flow Doppler.  LEFT VENTRICLE PLAX 2D LVIDd:         5.21 cm  Diastology LVIDs:         4.21 cm  LV e' lateral:   7.83 cm/s LV PW:         0.84 cm  LV E/e' lateral: 13.4 LV IVS:        0.89 cm  LV e' medial:    7.94 cm/s LVOT diam:     2.40 cm  LV E/e' medial:  13.2 LV SV:         54 LV SV Index:   26 LVOT Area:     4.52 cm  RIGHT VENTRICLE RV Basal diam:  3.08 cm LEFT ATRIUM             Index       RIGHT ATRIUM           Index LA diam:        4.70 cm 2.28 cm/m  RA Area:     14.10 cm LA Vol (A2C):   62.0 ml 30.08 ml/m RA Volume:   35.20 ml  17.08 ml/m LA Vol (A4C):   68.6 ml 33.29 ml/m LA Biplane Vol: 65.3 ml 31.68 ml/m  AORTIC VALVE                    PULMONIC VALVE AV Area (Vmax):    2.95 cm     PV Vmax:       1.10 m/s AV Area (Vmean):   2.86 cm     PV Vmean:      68.400 cm/s AV Area (VTI):     2.53 cm     PV VTI:        0.146 m AV Vmax:           146.00 cm/s  PV Peak grad:  4.8 mmHg AV Vmean:          103.000 cm/s PV Mean grad:  2.0 mmHg AV VTI:            0.213 m AV Peak Grad:      8.5 mmHg AV Mean Grad:      5.0 mmHg LVOT Vmax:         95.30 cm/s LVOT Vmean:        65.200 cm/s LVOT VTI:          0.119 m LVOT/AV VTI ratio: 0.56 AI PHT:            281 msec  AORTA Ao Root diam: 3.00 cm MITRAL VALVE MV Area (PHT): 3.92 cm     SHUNTS MV Peak grad:  7.5 mmHg     Systemic VTI:  0.12 m MV Mean grad:  4.0 mmHg     Systemic Diam: 2.40 cm MV Vmax:       1.37 m/s MV Vmean:      92.7 cm/s MV Decel Time: 194 msec MV E velocity: 104.80 cm/s Kathlyn Sacramento MD Electronically signed by Kathlyn Sacramento MD Signature Date/Time: 03/04/2019/2:46:43 PM    Final    [place] RT UPPER EXTREM LTD SOFT TISSUE NON VASCULAR  Result Date: 03/08/2019 CLINICAL DATA:  Swelling and bruising of the right upper extremity. Possible hematoma. EXAM: ULTRASOUND right UPPER EXTREMITY LIMITED TECHNIQUE: Ultrasound examination of the upper extremity soft tissues was performed in the area of clinical concern. COMPARISON:  None. FINDINGS: Diffuse  subcutaneous soft tissue swelling/edema and streaky fluid. No discrete fluid collection or hematoma. The underlying musculature is grossly normal. IMPRESSION: Diffuse subcutaneous soft tissue swelling/edema/streaky fluid but no discrete abscess or hematoma. Electronically Signed   By: Marijo Sanes M.D.   On: 03/08/2019 19:13    Time Spent in minutes 35   Finnean Cerami M.D on 03/09/2019 at 2:46 PM  Between 7am to 7pm - Pager - [phone]  After 7pm go to [email or web address] - password [facility]  [facility] -  Office  [phone]

## 2019-03-09 NOTE — Consult Note (Signed)
[place] for Heparin  Indication: atrial fibrillation- new onset  Patient Measurements: Height: 6' (182.9 cm) Weight: 203 lb 8 oz (92.3 kg) IBW/kg (Calculated) : 77.6 Heparin Dosing Weight: 86.2 kg   Vital Signs: Temp: 97.9 F (36.6 C) (03/06 0753) BP: 108/62 (03/06 1651) Pulse Rate: 70 (03/06 1651)  Labs: Recent Labs    03/07/19 0310 03/07/19 5498 03/08/19 0707 03/08/19 0707 03/08/19 1848 03/09/19 0356 03/09/19 0727 03/09/19 1709  HGB 10.2*   < > 9.2*  --   --  9.0*  --   --   HCT 28.3*  --  26.2*  --   --  26.7*  --   --   PLT 244  --  256  --   --  237  --   --   HEPARINUNFRC  --    < > <0.10*   < > 1.26*  --  0.96* 1.33*  CREATININE 5.25*  --  5.94*  --   --  5.94*  --   --    < > = values in this interval not displayed.    Estimated Creatinine Clearance: 14 mL/min (A) (by C-G formula based on SCr of 5.94 mg/dL (H)).   Medications:  Called RN to confirm whether pt has been on [facility] prior to admission. Nurse was not able to confirm as she was not in the patient's room. Per chart review, no AC prior to admission and confirmed with pharmacy technician who spoke to patient.   Heparin Course: 03/03 0152 HL 0.23: inc to 1950 units/hr 03/03 1449 HL 0.32: no change 03/04 0633 HL <0.10: inc to 2200 units/hr 03/04 1856 HL 0.14: 2000 unit bolus, incto 2400 units/hr.  03/05 0707 HL <0.10: 2500 units bolus, inc to 2600 units/hr 03/05 1848 HL 1.26 held Heparin for 1 hour and decrease heparin drip to 2400 units/hr. 03/06 0727 HL 0.96 Will decrease heparin to 2250 units/hr.      Assessment: Craig Koch is a 64 y.o. male with a past medical history of anxiety, CAD, diabetes, and hyperlipidemia, hypertension, lung cancer, presents to the emergency department for shortness of breath. Pharmacy was consulted for heparin dosing in a patient with new onset atrial fibrillation. H&H trending down slightly, platelets are stable   Goal of  Therapy:  Heparin level 0.3-0.7 units/ml Monitor platelets by anticoagulation protocol: Yes   Plan:  Heparin level is supratherapeutic. Will decrease heparin drip to 2250 units/hr. Recheck HL in 8h. CBC daily. CBC stable.   Antithrombin III level resulted lower than normal limits.   3/6: HL @ 1709 = 1.33 Will hold heparin gtt for 1 hr and restart @ 1950 units/hr Will recheck HL 8 hrs after restart.   Pharmacy will continue to monitor and adjust per consult.   Eliska Hamil D 03/09/2019 6:11 PM

## 2019-03-09 NOTE — Progress Notes (Signed)
[place], [place] 03/09/19  Subjective:  Patient seen and evaluated at bedside. Continues to have diminished renal function with a creatinine of 5.94 and urine output of 550 cc. Discussed the potential for renal placement therapy if urine output were to drop her renal function continued to deteriorate.    Objective:  Vital signs in last 24 hours:  Temp:  [97.3 F (36.3 C)-98.6 F (37 C)] 97.9 F (36.6 C) (03/06 0753) Pulse Rate:  [68-72] 72 (03/06 0753) Resp:  [16-20] 18 (03/06 0753) BP: (102-109)/(64-70) 109/66 (03/06 0753) SpO2:  [98 %-100 %] 100 % (03/06 0753) Weight:  [86.5 kg-92.3 kg] 92.3 kg (03/06 0437)  Weight change:  Filed Weights   03/03/19 2000 03/08/19 1148 03/09/19 0437  Weight: 83.9 kg 86.5 kg 92.3 kg    Intake/Output:    Intake/Output Summary (Last 24 hours) at 03/09/2019 1025 Last data filed at 03/09/2019 0438 Gross per 24 hour  Intake 1613.82 ml  Output 550 ml  Net 1063.82 ml     Physical Exam: General:  Ill-appearing, sitting up  HEENT  moist oral mucous membranes, decreased hearing  Pulm/lungs  normal effort, clear anteriorly and laterally  CVS/Heart  regular rhythm  Abdomen:   Soft, nontender  Extremities:  1+ pitting edema  Neurologic:  Alert,  able to answer simple questions appropriately  Skin:  Warm, dry          Basic Metabolic Panel:  Recent Labs  Lab 03/03/19 1750 03/03/19 1936 03/05/19 0153 03/05/19 0153 03/06/19 0152 03/06/19 0152 03/07/19 0310 03/08/19 0707 03/09/19 0356  NA 123*   < > 128*  --  129*  --  130* 132* 132*  K 5.4*   < > 3.5  --  3.3*  --  3.3* 3.5 4.0  CL 89*   < > 102  --  102  --  101 105 105  CO2 8*   < > 15*  --  14*  --  16* 13* 15*  GLUCOSE 756*   < > 311*  --  182*  --  144* 59* 90  BUN 43*   < > 65*  --  75*  --  84* 92* 98*  CREATININE 1.79*   < > 3.16*  --  4.22*  --  5.25* 5.94* 5.94*  CALCIUM 8.2*   < > 8.0*   < > 8.2*   < > 8.2* 7.9* 7.9*  MG 2.3  --  1.9  --   1.9  --   --   --   --   PHOS  --   --  1.4*  --   --   --   --   --   --    < > = values in this interval not displayed.     CBC: Recent Labs  Lab 03/03/19 1330 03/03/19 2359 03/05/19 0153 03/06/19 0152 03/07/19 0310 03/08/19 0707 03/09/19 0356  WBC 9.4   < > 8.5 7.8 6.3 7.9 7.7  NEUTROABS 7.3  --   --   --   --   --   --   HGB 13.4   < > 11.1* 11.0* 10.2* 9.2* 9.0*  HCT 42.9   < > 31.1* 31.0* 28.3* 26.2* 26.7*  MCV 87.4   < > 77.8* 77.1* 75.7* 77.1* 81.4  PLT 232   < > 189 204 244 256 237   < > = values in this interval not displayed.     No results  found for: HEPBSAG, HEPBSAB, HEPBIGM    Microbiology:  Recent Results (from the past 240 hour(s))  Respiratory Panel by RT PCR (Flu A&B, Covid) - Nasopharyngeal Swab     Status: None   Collection Time: 03/03/19  3:31 PM   Specimen: Nasopharyngeal Swab  Result Value Ref Range Status   SARS Coronavirus 2 by RT PCR NEGATIVE NEGATIVE Final    Comment: (NOTE) SARS-CoV-2 target nucleic acids are NOT DETECTED. The SARS-CoV-2 RNA is generally detectable in upper respiratoy specimens during the acute phase of infection. The lowest concentration of SARS-CoV-2 viral copies this assay can detect is 131 copies/mL. A negative result does not preclude SARS-Cov-2 infection and should not be used as the sole basis for treatment or other patient management decisions. A negative result may occur with  improper specimen collection/handling, submission of specimen other than nasopharyngeal swab, presence of viral mutation(s) within the areas targeted by this assay, and inadequate number of viral copies (<131 copies/mL). A negative result must be combined with clinical observations, patient history, and epidemiological information. The expected result is Negative. Fact Sheet for Patients:  PinkCheek.be Fact Sheet for Healthcare Providers:  GravelBags.it This test is not yet ap  proved or cleared by the [place] FDA and  has been authorized for detection and/or diagnosis of SARS-CoV-2 by FDA under an Emergency Use Authorization (EUA). This EUA will remain  in effect (meaning this test can be used) for the duration of the COVID-19 declaration under Section 564(b)(1) of the Act, 21 U.S.C. section 360bbb-3(b)(1), unless the authorization is terminated or revoked sooner.    Influenza A by PCR NEGATIVE NEGATIVE Final   Influenza B by PCR NEGATIVE NEGATIVE Final    Comment: (NOTE) The Xpert Xpress SARS-CoV-2/FLU/RSV assay is intended as an aid in  the diagnosis of influenza from Nasopharyngeal swab specimens and  should not be used as a sole basis for treatment. Nasal washings and  aspirates are unacceptable for Xpert Xpress SARS-CoV-2/FLU/RSV  testing. Fact Sheet for Patients: PinkCheek.be Fact Sheet for Healthcare Providers: GravelBags.it This test is not yet approved or cleared by the [place] FDA and  has been authorized for detection and/or diagnosis of SARS-CoV-2 by  FDA under an Emergency Use Authorization (EUA). This EUA will remain  in effect (meaning this test can be used) for the duration of the  Covid-19 declaration under Section 564(b)(1) of the Act, 21  U.S.C. section 360bbb-3(b)(1), unless the authorization is  terminated or revoked. Performed at [facility], [address] [place], [place] [address]   MRSA PCR Screening     Status: None   Collection Time: 03/03/19  7:48 PM   Specimen: Nasopharyngeal  Result Value Ref Range Status   MRSA by PCR NEGATIVE NEGATIVE Final    Comment:        The GeneXpert MRSA Assay (FDA approved for NASAL specimens only), is one component of a comprehensive MRSA colonization surveillance program. It is not intended to diagnose MRSA infection nor to guide or monitor treatment for MRSA infections. Performed at [facility],  [address] [place], [place] [address]   CULTURE, BLOOD (ROUTINE X 2) w Reflex to ID Panel     Status: None (Preliminary result)   Collection Time: 03/05/19 10:16 AM   Specimen: BLOOD  Result Value Ref Range Status   Specimen Description BLOOD LEFT ANTECUBITAL  Final   Special Requests   Final    BOTTLES DRAWN AEROBIC AND ANAEROBIC Blood Culture adequate volume  Culture   Final    NO GROWTH 4 DAYS Performed at [facility], [address] [place], [place] [address]    Report Status PENDING  Incomplete  CULTURE, BLOOD (ROUTINE X 2) w Reflex to ID Panel     Status: None (Preliminary result)   Collection Time: 03/05/19 10:22 AM   Specimen: BLOOD  Result Value Ref Range Status   Specimen Description BLOOD BLOOD LEFT HAND  Final   Special Requests   Final    BOTTLES DRAWN AEROBIC ONLY Blood Culture adequate volume   Culture   Final    NO GROWTH 4 DAYS Performed at [facility], [address], [place], [place] [address]    Report Status PENDING  Incomplete    Coagulation Studies: No results for input(s): LABPROT, INR in the last 72 hours.  Urinalysis: No results for input(s): COLORURINE, LABSPEC, PHURINE, GLUCOSEU, HGBUR, BILIRUBINUR, KETONESUR, PROTEINUR, UROBILINOGEN, NITRITE, LEUKOCYTESUR in the last 72 hours.  Invalid input(s): APPERANCEUR    Imaging: [place] RT UPPER EXTREM LTD SOFT TISSUE NON VASCULAR  Result Date: 03/08/2019 CLINICAL DATA:  Swelling and bruising of the right upper extremity. Possible hematoma. EXAM: ULTRASOUND right UPPER EXTREMITY LIMITED TECHNIQUE: Ultrasound examination of the upper extremity soft tissues was performed in the area of clinical concern. COMPARISON:  None. FINDINGS: Diffuse subcutaneous soft tissue swelling/edema and streaky fluid. No discrete fluid collection or hematoma. The underlying musculature is grossly normal. IMPRESSION: Diffuse subcutaneous soft tissue swelling/edema/streaky fluid but no discrete abscess or  hematoma. Electronically Signed   By: Marijo Sanes M.D.   On: 03/08/2019 19:13     Medications:   . dextrose 5 % and 0.9% NaCl 75 mL/hr at 03/08/19 2247  . diltiazem (CARDIZEM) infusion 10 mg/hr (03/03/19 1815)  . heparin 2,250 Units/hr (03/09/19 1014)  . small volume/piggyback builder 50 mL/hr at 03/09/19 1014   . amiodarone  400 mg Oral BID  . aspirin EC  81 mg Oral Daily  . chlorhexidine  15 mL Mouth Rinse BID  . Chlorhexidine Gluconate Cloth  6 each Topical Daily  . dextromethorphan-guaiFENesin  1 tablet Oral BID  . dronabinol  5 mg Oral BID AC  . DULoxetine  30 mg Oral Daily  . insulin aspart  0-15 Units Subcutaneous Q4H  . ipratropium-albuterol  3 mL Nebulization TID  . mouth rinse  15 mL Mouth Rinse q12n4p  . metoprolol tartrate  12.5 mg Oral BID  . multivitamin with minerals  1 tablet Oral Daily  . nicotine  21 mg Transdermal Daily  . polyethylene glycol  17 g Oral Daily  . Ensure Max Protein  11 oz Oral BID BM  . rosuvastatin  10 mg Oral Daily  . sodium bicarbonate  1,300 mg Oral BID   acetaminophen, albuterol, dextrose, menthol-cetylpyridinium, ondansetron (ZOFRAN) IV, pneumococcal 23 valent vaccine  Assessment/ Plan:  64 y.o. male with hypertension, hyperlipidemia, diabetes, depression, anxiety, adenocarcinoma of the lung stage IV, coronary disease, history of CABG, tobacco and h/o alcohol abuse, left pleural effusion,  admitted on 03/03/2019 for DKA (diabetic ketoacidoses) ([place]) [E11.10] New onset atrial fibrillation ([place]) [I48.91] Atrial fibrillation with rapid ventricular response ([place]) [I48.91] Diabetic ketoacidosis without coma associated with other specified diabetes mellitus ([place]) [E13.10]     #Acute kidney injury, Metabolic acidosis Baseline creatinine of 0.7 on February 5.  Patient did have IV contrast exposure on January 19. Renal imaging: Kidney ultrasound March 1: Normal kidneys  Acute kidney injury is likely secondary to ATN caused by hypotension,  hemodynamic instability.   BUN still quite high at 94 with a creatinine of 5.94.  Urine output was 550 cc over the preceding 24 hours.  No urgent indication for dialysis immediately however if urine output continues to drop or creatinine continues to worsen we may need to consider this.  Continue supportive care for now.  DC current IV fluids and off for sodium bicarbonate.   #Hyponatremia Serum sodium currently 132.  Continue to monitor closely.  #Diabetic ketoacidosis, poorly controlled diabetes Management as per ICU team Lab Results  Component Value Date   HGBA1C 11.3 (H) 03/04/2019   Multiple underlying chronic illnesses, poorly controlled diabetes, reported history of tobacco and h/o alcohol abuse, with underlying coronary disease, history of CABG, CHF with EF 30 to 35%, stage IV adenocarcinoma  # Hypokalemia Potassium up to 4.0.  Continue to monitor.       LOS: 6 Elenie Coven 3/6/202110:25 AM  [place], [place]  Note: This note was prepared with Dragon dictation. Any transcription errors are unintentional

## 2019-03-09 NOTE — Consult Note (Signed)
[place] for Heparin  Indication: atrial fibrillation- new onset  Patient Measurements: Height: 6' (182.9 cm) Weight: 203 lb 8 oz (92.3 kg) IBW/kg (Calculated) : 77.6 Heparin Dosing Weight: 86.2 kg   Vital Signs: Temp: 97.9 F (36.6 C) (03/06 0753) Temp Source: Oral (03/06 0437) BP: 109/66 (03/06 0753) Pulse Rate: 72 (03/06 0753)  Labs: Recent Labs    03/07/19 0310 03/07/19 6314 03/08/19 0707 03/08/19 1848 03/09/19 0356 03/09/19 0727  HGB 10.2*   < > 9.2*  --  9.0*  --   HCT 28.3*  --  26.2*  --  26.7*  --   PLT 244  --  256  --  237  --   HEPARINUNFRC  --    < > <0.10* 1.26*  --  0.96*  CREATININE 5.25*  --  5.94*  --  5.94*  --    < > = values in this interval not displayed.    Estimated Creatinine Clearance: 14 mL/min (A) (by C-G formula based on SCr of 5.94 mg/dL (H)).   Medications:  Called RN to confirm whether pt has been on [facility] prior to admission. Nurse was not able to confirm as she was not in the patient's room. Per chart review, no AC prior to admission and confirmed with pharmacy technician who spoke to patient.   Heparin Course: 03/03 0152 HL 0.23: inc to 1950 units/hr 03/03 1449 HL 0.32: no change 03/04 0633 HL <0.10: inc to 2200 units/hr 03/04 1856 HL 0.14: 2000 unit bolus, incto 2400 units/hr.  03/05 0707 HL <0.10: 2500 units bolus, inc to 2600 units/hr 03/05 1848 HL 1.26 held Heparin for 1 hour and decrease heparin drip to 2400 units/hr. 03/06 0727 HL 0.96 Will decrease heparin to 2250 units/hr.      Assessment: GURNEY BALTHAZOR is a 64 y.o. male with a past medical history of anxiety, CAD, diabetes, and hyperlipidemia, hypertension, lung cancer, presents to the emergency department for shortness of breath. Pharmacy was consulted for heparin dosing in a patient with new onset atrial fibrillation. H&H trending down slightly, platelets are stable   Goal of Therapy:  Heparin level 0.3-0.7 units/ml Monitor  platelets by anticoagulation protocol: Yes   Plan:  Heparin level is supratherapeutic. Will decrease heparin drip to 2250 units/hr. Recheck HL in 8h. CBC daily. CBC stable.   Antithrombin III level resulted lower than normal limits.   Pharmacy will continue to monitor and adjust per consult.   Eleonore Chiquito, PharmD, BCPS 03/09/2019 8:25 AM

## 2019-03-09 NOTE — Plan of Care (Signed)
  Problem: Education: Goal: Knowledge of General Education information will improve Description: Including pain rating scale, medication(s)/side effects and non-pharmacologic comfort measures Outcome: Progressing   Problem: Health Behavior/Discharge Planning: Goal: Ability to manage health-related needs will improve Outcome: Not Progressing Note: B.U.N. and creatinine levels worsening. Palliative care following. May need HD if lab values Abyan't improve. Will continue to monitor overall status for the remainder of the shift. Wenda Low [facility]

## 2019-03-09 NOTE — Progress Notes (Signed)
Progress Note  Patient Name: Craig Koch Date of Encounter: 03/09/2019  Primary Cardiologist: Rockey Situ  Subjective   Remains weak and fatigued.  Creatinine remains elevated with elevated BUN.  Shortness of breath has been improving.  Nephrology has discussed the possibility of dialysis.  Fortunately he remains in sinus rhythm.    Inpatient Medications    Scheduled Meds: . amiodarone  400 mg Oral BID  . aspirin EC  81 mg Oral Daily  . chlorhexidine  15 mL Mouth Rinse BID  . Chlorhexidine Gluconate Cloth  6 each Topical Daily  . dextromethorphan-guaiFENesin  1 tablet Oral BID  . dronabinol  5 mg Oral BID AC  . DULoxetine  30 mg Oral Daily  . insulin aspart  0-15 Units Subcutaneous Q4H  . ipratropium-albuterol  3 mL Nebulization TID  . mouth rinse  15 mL Mouth Rinse q12n4p  . metoprolol tartrate  12.5 mg Oral BID  . multivitamin with minerals  1 tablet Oral Daily  . nicotine  21 mg Transdermal Daily  . polyethylene glycol  17 g Oral Daily  . Ensure Max Protein  11 oz Oral BID BM  . rosuvastatin  10 mg Oral Daily  . sodium bicarbonate  1,300 mg Oral BID   Continuous Infusions: . diltiazem (CARDIZEM) infusion 10 mg/hr (03/03/19 1815)  . heparin 2,250 Units/hr (03/09/19 1014)  . sodium bicarbonate 150 mEq in dextrose 5% 1000 mL 150 mEq (03/09/19 1125)  . small volume/piggyback builder 50 mL/hr at 03/09/19 1014   PRN Meds: acetaminophen, albuterol, dextrose, menthol-cetylpyridinium, ondansetron (ZOFRAN) IV, pneumococcal 23 valent vaccine   Vital Signs    Vitals:   03/09/19 0437 03/09/19 0752 03/09/19 0753 03/09/19 1137  BP: 108/70  109/66 105/64  Pulse: 68  72 69  Resp: 20  18 16   Temp: 98.6 F (37 C)  97.9 F (36.6 C)   TempSrc: Oral     SpO2: 98% 100% 100% 100%  Weight: 92.3 kg     Height:        Intake/Output Summary (Last 24 hours) at 03/09/2019 1236 Last data filed at 03/09/2019 0438 Gross per 24 hour  Intake 1613.82 ml  Output 550 ml  Net 1063.82 ml    Filed Weights   03/03/19 2000 03/08/19 1148 03/09/19 0437  Weight: 83.9 kg 86.5 kg 92.3 kg    Telemetry    Sinus rhythm- Personally Reviewed  ECG    No new tracings - Personally Reviewed  Physical Exam   GEN: Chronically ill-appearing HEENT: normal  Neck: no JVD, carotid bruits, or masses Cardiac: RRR; no murmurs, rubs, or gallops,no edema  Respiratory:  clear to auscultation bilaterally, normal work of breathing GI: soft, nontender, nondistended, + BS MS: no deformity or atrophy  Skin: warm and dry, diffuse edema Neuro:  Strength and sensation are intact Psych: euthymic mood, full affect   Labs    Chemistry Recent Labs  Lab 03/03/19 1547 03/03/19 1547 03/03/19 1750 03/03/19 1936 03/07/19 0310 03/08/19 0707 03/09/19 0356  NA 123*   < > 123*   < > 130* 132* 132*  K 5.1   < > 5.4*   < > 3.3* 3.5 4.0  CL 89*   < > 89*   < > 101 105 105  CO2 <7*   < > 8*   < > 16* 13* 15*  GLUCOSE 718*   < > 756*   < > 144* 59* 90  BUN 37*   < > 43*   < >  84* 92* 98*  CREATININE 1.60*   < > 1.79*   < > 5.25* 5.94* 5.94*  CALCIUM 8.2*   < > 8.2*   < > 8.2* 7.9* 7.9*  PROT 6.6  --  7.5  --   --   --   --   ALBUMIN 2.9*  --  3.2*  --   --   --   --   AST 81*  --  136*  --   --   --   --   ALT 18  --  25  --   --   --   --   ALKPHOS 80  --  104  --   --   --   --   BILITOT 2.3*  --  2.4*  --   --   --   --   GFRNONAA 45*   < > 39*   < > 11* 9* 9*  GFRAA 52*   < > 46*   < > 12* 11* 11*  ANIONGAP NOT CALCULATED   < > 26*   < > 13 14 12    < > = values in this interval not displayed.     Hematology Recent Labs  Lab 03/07/19 0310 03/07/19 0310 03/08/19 0707 03/09/19 0356  WBC 6.3  --  7.9 7.7  RBC 3.74*   < > 3.40* 3.28*  3.32*  HGB 10.2*  --  9.2* 9.0*  HCT 28.3*  --  26.2* 26.7*  MCV 75.7*  --  77.1* 81.4  MCH 27.3  --  27.1 27.4  MCHC 36.0  --  35.1 33.7  RDW 17.5*  --  18.4* 19.9*  PLT 244  --  256 237   < > = values in this interval not displayed.    Cardiac  EnzymesNo results for input(s): TROPONINI in the last 168 hours. No results for input(s): TROPIPOC in the last 168 hours.   BNP Recent Labs  Lab 03/03/19 1330  BNP 330.0*     DDimer No results for input(s): DDIMER in the last 168 hours.   Radiology    CT HEAD WO CONTRAST  Result Date: 03/05/2019 IMPRESSION: No acute abnormality and negative for metastatic disease on unenhanced CT. Atrophy and mild chronic microvascular ischemia. Electronically Signed   By: Franchot Gallo M.D.   On: 03/05/2019 15:28    Cardiac Studies   2D Echo3/01/2019: 1. Left ventricular ejection fraction, by estimation, is 30 to 35%. The  left ventricle has moderately decreased function. The left ventricle  demonstrates global hypokinesis. Left ventricular diastolic parameters are  indeterminate.  2. Right ventricular systolic function is normal. The right ventricular  size is normal. Tricuspid regurgitation signal is inadequate for assessing  PA pressure.  3. Left atrial size was mild to moderately dilated.  4. The mitral valve is normal in structure and function. Moderate mitral  valve regurgitation. No evidence of mitral stenosis.  5. The aortic valve is normal in structure and function. Aortic valve  regurgitation is trivial. Mild to moderate aortic valve  sclerosis/calcification is present, without any evidence of aortic  stenosis.  6. The inferior vena cava is normal in size with greater than 50%  respiratory variability, suggesting right atrial pressure of 3 mmHg.   Patient Profile     64 y.o. 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   Assessment & Plan    1. CAD s/p CABG with NSTEMI: Currently on a heparin drip.  Had a markedly elevated high-sensitivity troponin.  Currently not a good candidate for ischemic evaluation due to kidney disease and  severe comorbidities.  Continue aspirin and Crestor.  He may benefit from the addition of Plavix at discharge. -Crestor  2. PAF with RVR: Fortunately has converted to sinus rhythm.  Blood pressures remain low.  Would continue p.o. amiodarone 400 mg twice daily.  Continue low-dose metoprolol and heparin drip.  2. HFrEF:  Is certainly volume overloaded.  This may be contributing to his kidney disease.  Iver Miklas allow nephrology to titrate his fluid status.  Currently on Toprol-XL.  Danielly Ackerley likely need hydralazine and nitrates at discharge pending kidney function.  3. ARF:  Unfortunately renal function continues to worsen.  Rhett Najera potentially need dialysis per nephrology.  4. Stage IV lung cancer with malignant pleural effusions and COPD with prior tobacco use:  Oncology following.  Complete tobacco cessation encouraged  5.  HLD: Continue Crestor  6. Anemia: No indication for transfusion at this time.  Continue close monitoring since he is on a heparin drip.  For questions or updates, please contact [place] Please consult [email or web address] for contact info under Cardiology/STEMI.    Signed, Christell Faith, PA-C [place] Pager: [phone] 03/09/2019, 12:36 PM

## 2019-03-10 ENCOUNTER — Inpatient Hospital Stay: Payer: Medicare Other

## 2019-03-10 LAB — CBC
HCT: 23.1 % — ABNORMAL LOW (ref 39.0–52.0)
Hemoglobin: 8 g/dL — ABNORMAL LOW (ref 13.0–17.0)
MCH: 27 pg (ref 26.0–34.0)
MCHC: 34.6 g/dL (ref 30.0–36.0)
MCV: 78 fL — ABNORMAL LOW (ref 80.0–100.0)
Platelets: 244 10*3/uL (ref 150–400)
RBC: 2.96 MIL/uL — ABNORMAL LOW (ref 4.22–5.81)
RDW: 19 % — ABNORMAL HIGH (ref 11.5–15.5)
WBC: 6.3 10*3/uL (ref 4.0–10.5)
nRBC: 0 % (ref 0.0–0.2)

## 2019-03-10 LAB — GLUCOSE, CAPILLARY
Glucose-Capillary: 121 mg/dL — ABNORMAL HIGH (ref 70–99)
Glucose-Capillary: 124 mg/dL — ABNORMAL HIGH (ref 70–99)
Glucose-Capillary: 126 mg/dL — ABNORMAL HIGH (ref 70–99)
Glucose-Capillary: 140 mg/dL — ABNORMAL HIGH (ref 70–99)
Glucose-Capillary: 147 mg/dL — ABNORMAL HIGH (ref 70–99)
Glucose-Capillary: 147 mg/dL — ABNORMAL HIGH (ref 70–99)
Glucose-Capillary: 170 mg/dL — ABNORMAL HIGH (ref 70–99)
Glucose-Capillary: 187 mg/dL — ABNORMAL HIGH (ref 70–99)
Glucose-Capillary: 200 mg/dL — ABNORMAL HIGH (ref 70–99)

## 2019-03-10 LAB — CULTURE, BLOOD (ROUTINE X 2)
Culture: NO GROWTH
Culture: NO GROWTH
Special Requests: ADEQUATE
Special Requests: ADEQUATE

## 2019-03-10 LAB — RENAL FUNCTION PANEL
Albumin: 1.9 g/dL — ABNORMAL LOW (ref 3.5–5.0)
Anion gap: 19 — ABNORMAL HIGH (ref 5–15)
BUN: 101 mg/dL — ABNORMAL HIGH (ref 8–23)
CO2: 15 mmol/L — ABNORMAL LOW (ref 22–32)
Calcium: 7.5 mg/dL — ABNORMAL LOW (ref 8.9–10.3)
Chloride: 96 mmol/L — ABNORMAL LOW (ref 98–111)
Creatinine, Ser: 6.09 mg/dL — ABNORMAL HIGH (ref 0.61–1.24)
GFR calc Af Amer: 10 mL/min — ABNORMAL LOW (ref >60)
GFR calc non Af Amer: 9 mL/min — ABNORMAL LOW (ref >60)
Glucose, Bld: 155 mg/dL — ABNORMAL HIGH (ref 70–99)
Phosphorus: 5 mg/dL — ABNORMAL HIGH (ref 2.5–4.6)
Potassium: 4 mmol/L (ref 3.5–5.1)
Sodium: 130 mmol/L — ABNORMAL LOW (ref 135–145)

## 2019-03-10 LAB — HEPARIN LEVEL (UNFRACTIONATED)
Heparin Unfractionated: 3.44 IU/mL — ABNORMAL HIGH (ref 0.30–0.70)
Heparin Unfractionated: 0.45 IU/mL (ref 0.30–0.70)
Heparin Unfractionated: 0.46 IU/mL (ref 0.30–0.70)

## 2019-03-10 MED ORDER — HEPARIN (PORCINE) 25000 UT/250ML-% IV SOLN
1650.0000 [IU]/h | INTRAVENOUS | Status: DC
  Administered 2019-03-10 – 2019-03-11 (×3): 1700 [IU]/h via INTRAVENOUS
  Administered 2019-03-11 – 2019-03-13 (×3): 1650 [IU]/h via INTRAVENOUS
  Administered 2019-03-13: 1600 [IU]/h via INTRAVENOUS
  Filled 2019-03-10 (×6): qty 250

## 2019-03-10 MED ORDER — SALINE SPRAY 0.65 % NA SOLN
1.0000 | NASAL | Status: DC | PRN
  Administered 2019-03-10 – 2019-03-11 (×2): 1 via NASAL
  Filled 2019-03-10: qty 44

## 2019-03-10 NOTE — Plan of Care (Signed)

## 2019-03-10 NOTE — Progress Notes (Addendum)
Pt is complaining of a stuck up nose. Notify prime. Will continue to monitor.  Update 0239: [place] NP ordered sodium chloride (OCEAN) 0.65% nasal spray 1 spray on each nare. Will continue to monitor.  Update 0440: Pharmacy called and ordered to stopped heparin drip at this time and restart after one hour. Heparin drip was stopped. Will continue to monitor.

## 2019-03-10 NOTE — Progress Notes (Signed)
PT Cancellation Note  Patient Details Name: Craig Koch MRN: [id number] DOB: 1955-01-21   Cancelled Treatment:    Reason Eval/Treat Not Completed: Medical issues which prohibited therapy. Per RN, pt not appropriate at this time. Also, per chart review, pt's BP is 91/55 and INR was last 3.44 and yet to be re-assessed.   Madilyn Hook 03/10/2019, 1:37 PM

## 2019-03-10 NOTE — Progress Notes (Signed)
PROGRESS NOTE                                                                                                                                                                                                             Patient Demographics:    Craig Koch, is a 64 y.o. male, DOB - 01-10-1955, JJK:[id number]  Admit date - 03/03/2019   Admitting Physician Ivor Costa, MD  Outpatient Primary MD for the patient is Tonia Ghent, MD  LOS - 7  Outpatient Specialists: Oncology  Chief Complaint  Patient presents with   Shortness of Breath       Brief Narrative 64 year old male with hypertension, hyperlipidemia, type 2 diabetes mellitus, anxiety depression, stage IV lung cancer, CAD with history of CABG, tobacco and alcohol use who presented with generalized weakness and shortness of breath.  In the ED he was found to be in DKA, AKI with creatinine of 1.6, new onset A. fib with RVR.  Required BiPAP and admission to stepdown unit. Hospital course prolonged due to persistent encephalopathy, rapid A. fib, and ATN.   Subjective:   Still feels tired.  Has urine output of around 700 cc.  Heart rate stable.   Assessment  & Plan :    Principal Problem: Acute tubular necrosis Suspect prerenal with hypovolemia from ketoacidosis.  Creatinine continues to worsen at 6.94 today.  Urine output of 700 cc.  Started on bicarb drip by nephrology yesterday.  Labs sent for SPEP, UPEP, ANA, ANCA, GBM, complements.  Possibly progressing towards need for dialysis.     Active symptoms New onset atrial fibrillation with RVR ([place]) Now converted to normal sinus.   Cardiology recommends to continue amiodarone 40 mg twice daily for 5 days and then reduce to 200 mg twice daily. Continue IV heparin.  Given his poor renal function cannot transition to Eliquis or Xarelto yet. Metoprolol dose reduced to 12.5 mg twice daily given low blood  pressure.   Acute metabolic encephalopathy Now resolved.  Was on antibiotic for possible underlying infection, now discontinued.  Acute systolic CHF (EF 37-16%) NSTEMI Presented with markedly elevated troponin and suspect NSTEMI with respiratory failure and DKA. Continue IV heparin, beta-blocker and statin.  Metoprolol dose reduced.  Further ischemic evaluation upon resolution of acute issues. .  Continue to monitor with strict I's/O and daily weight.  Uncontrolled type II diabetes mellitus with DKA DKA resolved on insulin drip.  Patient became hypoglycemic on 3/5 possibly due to increased dose of Lantus and worsened renal function.  Off insulin and being monitored on sliding scale only.  Alcohol abuse Encephalopathy could be contributed by withdrawal symptoms earlier.  Resolved.  Tobacco abuse Nicotine patch.   Stage IV lung adenocarcinoma Dr. Tasia Catchings following him in the hospital.  Recommend to hold dabrafenib and trametinib.  Iron deficiency anemia We will plan on iron infusion once dyspnea better.  Hypothermia Secondary to DKA.  Resolved    Code Status : DNR (being followed by palliative care)  Family Communication  : Wife involved in care  Disposition Plan  : Needs SNF once renal function improved and IV heparin transition to oral anticoagulant.  Barriers For Discharge : Ongoing several medical issues.  (Need for IV heparin, progressive ATN with possible inpatient dialysis need, hypo and hyperglycemia, CHF)  Consults  : Cardiology, nephrology, critical care, oncology  Procedures  : 2D echo, head CT  DVT Prophylaxis  : IV heparin  Lab Results  Component Value Date   PLT 244 03/10/2019    Antibiotics  :    Anti-infectives (From admission, onward)   Start     Dose/Rate Route Frequency Ordered Stop   03/07/19 1200  vancomycin (VANCOCIN) IVPB 1000 mg/200 mL premix     1,000 mg 200 mL/hr over 60 Minutes Intravenous  Once 03/07/19 0801 03/07/19 2024   03/06/19 2200   piperacillin-tazobactam (ZOSYN) IVPB 3.375 g  Status:  Discontinued     3.375 g 12.5 mL/hr over 240 Minutes Intravenous Every 12 hours 03/06/19 0858 03/08/19 1408   03/05/19 1420  vancomycin variable dose per unstable renal function (pharmacist dosing)  Status:  Discontinued      Does not apply See admin instructions 03/05/19 1420 03/08/19 1408   03/05/19 1200  vancomycin (VANCOREADY) IVPB 1500 mg/300 mL     1,500 mg 150 mL/hr over 120 Minutes Intravenous  Once 03/05/19 0944 03/06/19 2324   03/05/19 1100  piperacillin-tazobactam (ZOSYN) IVPB 3.375 g  Status:  Discontinued     3.375 g 12.5 mL/hr over 240 Minutes Intravenous Every 8 hours 03/05/19 0944 03/06/19 0858        Objective:   Vitals:   03/10/19 0450 03/10/19 0600 03/10/19 0804 03/10/19 1156  BP: 99/62 104/75 (!) 108/59 (!) 91/55  Pulse: 69  67 (!) 58  Resp: 20  18 16   Temp: 98 F (36.7 C)  (!) 97.3 F (36.3 C) (!) 97.5 F (36.4 C)  TempSrc:   Oral Oral  SpO2: 99%  99% 100%  Weight:      Height:        Wt Readings from Last 3 Encounters:  03/09/19 92.3 kg  02/14/19 86.6 kg  02/08/19 86.9 kg     Intake/Output Summary (Last 24 hours) at 03/10/2019 1431 Last data filed at 03/10/2019 0700 Gross per 24 hour  Intake 1068.84 ml  Output 700 ml  Net 368.84 ml   Physical exam Middle-aged male appears fatigued HEENT: Moist mucosa, supple neck Chest: Clear bilaterally CVs: Normal S1-S2 GI: Soft, nontender, nondistended Musculoskeletal: Warm, no edema     Data Review:    CBC Recent Labs  Lab 03/06/19 0152 03/07/19 0310 03/08/19 0707 03/09/19 0356 03/10/19 0319  WBC 7.8 6.3 7.9 7.7 6.3  HGB 11.0* 10.2* 9.2* 9.0* 8.0*  HCT 31.0* 28.3* 26.2* 26.7* 23.1*  PLT 204 244 256 237 244  MCV  77.1* 75.7* 77.1* 81.4 78.0*  MCH 27.4 27.3 27.1 27.4 27.0  MCHC 35.5 36.0 35.1 33.7 34.6  RDW 17.4* 17.5* 18.4* 19.9* 19.0*    Chemistries  Recent Labs  Lab 03/03/19 1547 03/03/19 1547 03/03/19 1750 03/03/19 1936  03/05/19 0153 03/05/19 0153 03/06/19 0152 03/07/19 0310 03/08/19 0707 03/09/19 0356 03/10/19 0319  NA 123*   < > 123*   < > 128*   < > 129* 130* 132* 132* 130*  K 5.1   < > 5.4*   < > 3.5   < > 3.3* 3.3* 3.5 4.0 4.0  CL 89*   < > 89*   < > 102   < > 102 101 105 105 96*  CO2 <7*   < > 8*   < > 15*   < > 14* 16* 13* 15* 15*  GLUCOSE 718*   < > 756*   < > 311*   < > 182* 144* 59* 90 155*  BUN 37*   < > 43*   < > 65*   < > 75* 84* 92* 98* 101*  CREATININE 1.60*   < > 1.79*   < > 3.16*   < > 4.22* 5.25* 5.94* 5.94* 6.09*  CALCIUM 8.2*   < > 8.2*   < > 8.0*   < > 8.2* 8.2* 7.9* 7.9* 7.5*  MG  --   --  2.3  --  1.9  --  1.9  --   --   --   --   AST 81*  --  136*  --   --   --   --   --   --   --   --   ALT 18  --  25  --   --   --   --   --   --   --   --   ALKPHOS 80  --  104  --   --   --   --   --   --   --   --   BILITOT 2.3*  --  2.4*  --   --   --   --   --   --   --   --    < > = values in this interval not displayed.   ------------------------------------------------------------------------------------------------------------------ No results for input(s): CHOL, HDL, LDLCALC, TRIG, CHOLHDL, LDLDIRECT in the last 72 hours.  Lab Results  Component Value Date   HGBA1C 11.3 (H) 03/04/2019   ------------------------------------------------------------------------------------------------------------------ No results for input(s): TSH, T4TOTAL, T3FREE, THYROIDAB in the last 72 hours.  Invalid input(s): FREET3 ------------------------------------------------------------------------------------------------------------------ Recent Labs    03/09/19 0009 03/09/19 0356  FERRITIN 676*  --   TIBC 183*  --   IRON 19*  --   RETICCTPCT  --  0.3*    Coagulation profile Recent Labs  Lab 03/03/19 1750  INR 1.2    No results for input(s): DDIMER in the last 72 hours.  Cardiac Enzymes No results for input(s): CKMB, TROPONINI, MYOGLOBIN in the last 168 hours.  Invalid input(s):  CK ------------------------------------------------------------------------------------------------------------------    Component Value Date/Time   BNP 330.0 (H) 03/03/2019 1330    Inpatient Medications  Scheduled Meds:  amiodarone  400 mg Oral BID   aspirin EC  81 mg Oral Daily   chlorhexidine  15 mL Mouth Rinse BID   Chlorhexidine Gluconate Cloth  6 each Topical Daily   dextromethorphan-guaiFENesin  1 tablet Oral BID   dronabinol  5  mg Oral BID AC   DULoxetine  30 mg Oral Daily   insulin aspart  0-15 Units Subcutaneous Q4H   ipratropium-albuterol  3 mL Nebulization TID   mouth rinse  15 mL Mouth Rinse q12n4p   metoprolol tartrate  12.5 mg Oral BID   multivitamin with minerals  1 tablet Oral Daily   nicotine  21 mg Transdermal Daily   polyethylene glycol  17 g Oral Daily   Ensure Max Protein  11 oz Oral BID BM   rosuvastatin  10 mg Oral Daily   sodium bicarbonate  1,300 mg Oral BID   Continuous Infusions:  heparin 1,700 Units/hr (03/10/19 0547)   sodium bicarbonate 150 mEq in dextrose 5% 1000 mL 150 mEq (03/10/19 1430)   small volume/piggyback builder 50 mL/hr at 03/09/19 1014   PRN Meds:.acetaminophen, albuterol, dextrose, menthol-cetylpyridinium, ondansetron (ZOFRAN) IV, pneumococcal 23 valent vaccine, sodium chloride  Micro Results Recent Results (from the past 240 hour(s))  Respiratory Panel by RT PCR (Flu A&B, Covid) - Nasopharyngeal Swab     Status: None   Collection Time: 03/03/19  3:31 PM   Specimen: Nasopharyngeal Swab  Result Value Ref Range Status   SARS Coronavirus 2 by RT PCR NEGATIVE NEGATIVE Final    Comment: (NOTE) SARS-CoV-2 target nucleic acids are NOT DETECTED. The SARS-CoV-2 RNA is generally detectable in upper respiratoy specimens during the acute phase of infection. The lowest concentration of SARS-CoV-2 viral copies this assay can detect is 131 copies/mL. A negative result does not preclude SARS-Cov-2 infection and should  not be used as the sole basis for treatment or other patient management decisions. A negative result may occur with  improper specimen collection/handling, submission of specimen other than nasopharyngeal swab, presence of viral mutation(s) within the areas targeted by this assay, and inadequate number of viral copies (<131 copies/mL). A negative result must be combined with clinical observations, patient history, and epidemiological information. The expected result is Negative. Fact Sheet for Patients:  PinkCheek.be Fact Sheet for Healthcare Providers:  GravelBags.it This test is not yet ap proved or cleared by the [place] FDA and  has been authorized for detection and/or diagnosis of SARS-CoV-2 by FDA under an Emergency Use Authorization (EUA). This EUA will remain  in effect (meaning this test can be used) for the duration of the COVID-19 declaration under Section 564(b)(1) of the Act, 21 U.S.C. section 360bbb-3(b)(1), unless the authorization is terminated or revoked sooner.    Influenza A by PCR NEGATIVE NEGATIVE Final   Influenza B by PCR NEGATIVE NEGATIVE Final    Comment: (NOTE) The Xpert Xpress SARS-CoV-2/FLU/RSV assay is intended as an aid in  the diagnosis of influenza from Nasopharyngeal swab specimens and  should not be used as a sole basis for treatment. Nasal washings and  aspirates are unacceptable for Xpert Xpress SARS-CoV-2/FLU/RSV  testing. Fact Sheet for Patients: PinkCheek.be Fact Sheet for Healthcare Providers: GravelBags.it This test is not yet approved or cleared by the [place] FDA and  has been authorized for detection and/or diagnosis of SARS-CoV-2 by  FDA under an Emergency Use Authorization (EUA). This EUA will remain  in effect (meaning this test can be used) for the duration of the  Covid-19 declaration under Section 564(b)(1)  of the Act, 21  U.S.C. section 360bbb-3(b)(1), unless the authorization is  terminated or revoked. Performed at [facility], [address] [place], [place] [address]   MRSA PCR Screening     Status: None   Collection Time: 03/03/19  7:48 PM   Specimen: Nasopharyngeal  Result Value Ref Range Status   MRSA by PCR NEGATIVE NEGATIVE Final    Comment:        The GeneXpert MRSA Assay (FDA approved for NASAL specimens only), is one component of a comprehensive MRSA colonization surveillance program. It is not intended to diagnose MRSA infection nor to guide or monitor treatment for MRSA infections. Performed at [facility], [address] [place], [place] [address]   CULTURE, BLOOD (ROUTINE X 2) w Reflex to ID Panel     Status: None   Collection Time: 03/05/19 10:16 AM   Specimen: BLOOD  Result Value Ref Range Status   Specimen Description BLOOD LEFT ANTECUBITAL  Final   Special Requests   Final    BOTTLES DRAWN AEROBIC AND ANAEROBIC Blood Culture adequate volume   Culture   Final    NO GROWTH 5 DAYS Performed at [facility], [address] [place], [place] [address]    Report Status 03/10/2019 FINAL  Final  CULTURE, BLOOD (ROUTINE X 2) w Reflex to ID Panel     Status: None   Collection Time: 03/05/19 10:22 AM   Specimen: BLOOD  Result Value Ref Range Status   Specimen Description BLOOD BLOOD LEFT HAND  Final   Special Requests   Final    BOTTLES DRAWN AEROBIC ONLY Blood Culture adequate volume   Culture   Final    NO GROWTH 5 DAYS Performed at [facility], [address], [place],  [address]    Report Status 03/10/2019 FINAL  Final    Radiology Reports CT HEAD WO CONTRAST  Result Date: 03/05/2019 CLINICAL DATA:  Metastatic disease evaluation.  Lung cancer. EXAM: CT HEAD WITHOUT CONTRAST TECHNIQUE: Contiguous axial images were obtained from the base of the skull through the vertex without intravenous contrast.  COMPARISON:  MRI head 09/20/2018 FINDINGS: Brain: Mild atrophy. Mild hypodensity in the white matter unchanged from the prior MRI. No acute infarct, hemorrhage, mass. No edema or midline shift. Vascular: Negative for hyperdense vessel Skull: Negative skull. Sinuses/Orbits: Mucosal edema and bony thickening right maxillary sinus. Chronic nasal bone fracture. Left cataract extraction. Other: None IMPRESSION: No acute abnormality and negative for metastatic disease on unenhanced CT. Atrophy and mild chronic microvascular ischemia. Electronically Signed   By: Franchot Gallo M.D.   On: 03/05/2019 15:28   US RENAL  Result Date: 03/04/2019 CLINICAL DATA:  Acute kidney injury EXAM: RENAL / URINARY TRACT ULTRASOUND COMPLETE COMPARISON:  CT 01/22/2019 FINDINGS: Right Kidney: Renal measurements: 11.4 x 6 x 6 cm = volume: 215 mL . Echogenicity within normal limits. No mass or hydronephrosis visualized. Left Kidney: Renal measurements: 11.8 x 7.3 x 6.5 cm = volume: 291.8 mL. Echogenicity within normal limits. No mass or hydronephrosis visualized. Bladder: Appears normal for degree of bladder distention. Other: Incidental note made of gallstones and small effusions. Heterogenous prostate. IMPRESSION: Normal ultrasound appearance of the kidneys Electronically Signed   By: Donavan Foil M.D.   On: 03/04/2019 15:37   DG Chest Portable 1 View  Result Date: 03/03/2019 CLINICAL DATA:  Shortness of breath, since Monday worsening this morning around 1 a.m. EXAM: PORTABLE CHEST 1 VIEW COMPARISON:  12/19/2018 FINDINGS: Cardiomediastinal contours are stable following median sternotomy with persistent left-sided pleural effusion and pleural-parenchymal scarring. No new areas of consolidation or evidence of right-sided pleural effusion. No signs of acute bone process. IMPRESSION: Persistent left-sided pleural effusion and pleural-parenchymal scarring. Electronically Signed   By: Cay Schillings  Wile M.D.   On: 03/03/2019 13:59    ECHOCARDIOGRAM COMPLETE  Result Date: 03/04/2019    ECHOCARDIOGRAM REPORT   Patient Name:   Craig Koch Date of Exam: 03/04/2019 Medical Rec #:  [id number]       Height:       72.0 in Accession #:    [id number]      Weight:       185.0 lb Date of Birth:  [birth year]       BSA:          2.061 m Patient Age:    52 years        BP:           125/72 mmHg Patient Gender: M               HR:           119 bpm. Exam Location:  [facility] Procedure: 2D Echo, Color Doppler and Cardiac Doppler Indications:     I21.4 NSTEMI  History:         Patient has prior history of Echocardiogram examinations, most                  recent 08/24/2018. CAD, Prior CABG; Risk Factors:Hypertension,                  Dyslipidemia and Diabetes. Lung Cancer.  Sonographer:     Charmayne Sheer RDCS (AE) Referring Phys:  [place] Phys: Kathlyn Sacramento MD IMPRESSIONS  1. Left ventricular ejection fraction, by estimation, is 30 to 35%. The left ventricle has moderately decreased function. The left ventricle demonstrates global hypokinesis. Left ventricular diastolic parameters are indeterminate.  2. Right ventricular systolic function is normal. The right ventricular size is normal. Tricuspid regurgitation signal is inadequate for assessing PA pressure.  3. Left atrial size was mild to moderately dilated.  4. The mitral valve is normal in structure and function. Moderate mitral valve regurgitation. No evidence of mitral stenosis.  5. The aortic valve is normal in structure and function. Aortic valve regurgitation is trivial. Mild to moderate aortic valve sclerosis/calcification is present, without any evidence of aortic stenosis.  6. The inferior vena cava is normal in size with greater than 50% respiratory variability, suggesting right atrial pressure of 3 mmHg. FINDINGS  Left Ventricle: Left ventricular ejection fraction, by estimation, is 30 to 35%. The left ventricle has moderately decreased function. The left ventricle demonstrates  global hypokinesis. The left ventricular internal cavity size was normal in size. There is no left ventricular hypertrophy. Left ventricular diastolic parameters are indeterminate. Right Ventricle: The right ventricular size is normal. No increase in right ventricular wall thickness. Right ventricular systolic function is normal. Tricuspid regurgitation signal is inadequate for assessing PA pressure. Left Atrium: Left atrial size was mild to moderately dilated. Right Atrium: Right atrial size was normal in size. Pericardium: There is no evidence of pericardial effusion. Mitral Valve: The mitral valve is normal in structure and function. Normal mobility of the mitral valve leaflets. Moderate mitral valve regurgitation. No evidence of mitral valve stenosis. MV peak gradient, 7.5 mmHg. The mean mitral valve gradient is 4.0  mmHg. Tricuspid Valve: The tricuspid valve is normal in structure. Tricuspid valve regurgitation is not demonstrated. No evidence of tricuspid stenosis. Aortic Valve: The aortic valve is normal in structure and function. Aortic valve regurgitation is trivial. Aortic regurgitation PHT measures 281 msec. Mild to moderate aortic valve sclerosis/calcification is present, without any evidence of  aortic stenosis. Aortic valve mean gradient measures 5.0 mmHg. Aortic valve peak gradient measures 8.5 mmHg. Aortic valve area, by VTI measures 2.53 cm. Pulmonic Valve: The pulmonic valve was normal in structure. Pulmonic valve regurgitation is not visualized. No evidence of pulmonic stenosis. Aorta: The aortic root is normal in size and structure. Venous: The inferior vena cava is normal in size with greater than 50% respiratory variability, suggesting right atrial pressure of 3 mmHg. IAS/Shunts: No atrial level shunt detected by color flow Doppler.  LEFT VENTRICLE PLAX 2D LVIDd:         5.21 cm  Diastology LVIDs:         4.21 cm  LV e' lateral:   7.83 cm/s LV PW:         0.84 cm  LV E/e' lateral: 13.4 LV IVS:         0.89 cm  LV e' medial:    7.94 cm/s LVOT diam:     2.40 cm  LV E/e' medial:  13.2 LV SV:         54 LV SV Index:   26 LVOT Area:     4.52 cm  RIGHT VENTRICLE RV Basal diam:  3.08 cm LEFT ATRIUM             Index       RIGHT ATRIUM           Index LA diam:        4.70 cm 2.28 cm/m  RA Area:     14.10 cm LA Vol (A2C):   62.0 ml 30.08 ml/m RA Volume:   35.20 ml  17.08 ml/m LA Vol (A4C):   68.6 ml 33.29 ml/m LA Biplane Vol: 65.3 ml 31.68 ml/m  AORTIC VALVE                    PULMONIC VALVE AV Area (Vmax):    2.95 cm     PV Vmax:       1.10 m/s AV Area (Vmean):   2.86 cm     PV Vmean:      68.400 cm/s AV Area (VTI):     2.53 cm     PV VTI:        0.146 m AV Vmax:           146.00 cm/s  PV Peak grad:  4.8 mmHg AV Vmean:          103.000 cm/s PV Mean grad:  2.0 mmHg AV VTI:            0.213 m AV Peak Grad:      8.5 mmHg AV Mean Grad:      5.0 mmHg LVOT Vmax:         95.30 cm/s LVOT Vmean:        65.200 cm/s LVOT VTI:          0.119 m LVOT/AV VTI ratio: 0.56 AI PHT:            281 msec  AORTA Ao Root diam: 3.00 cm MITRAL VALVE MV Area (PHT): 3.92 cm     SHUNTS MV Peak grad:  7.5 mmHg     Systemic VTI:  0.12 m MV Mean grad:  4.0 mmHg     Systemic Diam: 2.40 cm MV Vmax:       1.37 m/s MV Vmean:      92.7 cm/s MV Decel Time: 194 msec MV E velocity: 104.80 cm/s Kathlyn Sacramento MD Electronically  signed by Kathlyn Sacramento MD Signature Date/Time: 03/04/2019/2:46:43 PM    Final    [place] RT UPPER EXTREM LTD SOFT TISSUE NON VASCULAR  Result Date: 03/08/2019 CLINICAL DATA:  Swelling and bruising of the right upper extremity. Possible hematoma. EXAM: ULTRASOUND right UPPER EXTREMITY LIMITED TECHNIQUE: Ultrasound examination of the upper extremity soft tissues was performed in the area of clinical concern. COMPARISON:  None. FINDINGS: Diffuse subcutaneous soft tissue swelling/edema and streaky fluid. No discrete fluid collection or hematoma. The underlying musculature is grossly normal. IMPRESSION: Diffuse subcutaneous soft  tissue swelling/edema/streaky fluid but no discrete abscess or hematoma. Electronically Signed   By: Marijo Sanes M.D.   On: 03/08/2019 19:13    Time Spent in minutes 35   Louetta Hollingshead M.D on 03/10/2019 at 2:31 PM  Between 7am to 7pm - Pager - [phone]  After 7pm go to [email or web address] - password [facility]  [facility] -  Office  [phone]

## 2019-03-10 NOTE — Consult Note (Addendum)
[place] for Heparin  Indication: atrial fibrillation- new onset  Patient Measurements: Height: 6' (182.9 cm) Weight: 203 lb 8 oz (92.3 kg) IBW/kg (Calculated) : 77.6 Heparin Dosing Weight: 86.2 kg   Vital Signs: Temp: 97.5 F (36.4 C) (03/07 1156) Temp Source: Oral (03/07 1156) BP: 91/55 (03/07 1156) Pulse Rate: 58 (03/07 1156)  Labs: Recent Labs    03/08/19 0707 03/08/19 1848 03/09/19 0356 03/09/19 0727 03/09/19 1709 03/10/19 0319 03/10/19 1350  HGB 9.2*  --  9.0*  --   --  8.0*  --   HCT 26.2*  --  26.7*  --   --  23.1*  --   PLT 256  --  237  --   --  244  --   HEPARINUNFRC <0.10*   < >  --    < > 1.33* 3.44* 0.45  CREATININE 5.94*  --  5.94*  --   --  6.09*  --    < > = values in this interval not displayed.    Estimated Creatinine Clearance: 13.6 mL/min (A) (by C-G formula based on SCr of 6.09 mg/dL (H)).  Medications:  Called RN to confirm whether pt has been on [facility] prior to admission. Nurse was not able to confirm as she was not in the patient's room. Per chart review, no AC prior to admission and confirmed with pharmacy technician who spoke to patient.   Heparin Course: 03/03 0152 HL 0.23: inc to 1950 units/hr 03/03 1449 HL 0.32: no change 03/04 0633 HL <0.10: inc to 2200 units/hr 03/04 1856 HL 0.14: 2000 unit bolus, incto 2400 units/hr.  03/05 0707 HL <0.10: 2500 units bolus, inc to 2600 units/hr 03/05 1848 HL 1.26 held Heparin for 1 hour and decrease heparin drip to 2400 units/hr. 03/06 0727 HL 0.96 Will decrease heparin to 2250 units/hr.  3/6 1709 HL 1.33  3/7 0319 HL 3.44  3/7 1350 HL 0.45   Assessment: Craig Koch is a 64 y.o. male with a past medical history of anxiety, CAD, diabetes, and hyperlipidemia, hypertension, lung cancer, presents to the emergency department for shortness of breath. Pharmacy was consulted for heparin dosing in a patient with new onset atrial fibrillation. H&H trending down slightly,  platelets are stable   Goal of Therapy:  Heparin level 0.3-0.7 units/ml Monitor platelets by anticoagulation protocol: Yes   Plan:  Heparin level is therapeutic. Will continue heparin drip at 1700 units/hr. Recheck HL in [place]. CBC daily and monitor for s/sx bleeding complications. Hgb is trending down, potentially due to worsening renal function and IDA?   Antithrombin III level resulted lower than normal limits.   Pharmacy will continue to monitor and adjust per consult.   Oswald Hillock 03/10/2019 3:07 PM

## 2019-03-10 NOTE — Consult Note (Signed)
[place] for Heparin  Indication: atrial fibrillation- new onset  Patient Measurements: Height: 6' (182.9 cm) Weight: 203 lb 8 oz (92.3 kg) IBW/kg (Calculated) : 77.6 Heparin Dosing Weight: 86.2 kg   Vital Signs: Temp: 98 F (36.7 C) (03/07 2007) Temp Source: Oral (03/07 2007) BP: 126/88 (03/07 2007) Pulse Rate: 65 (03/07 2028)  Labs: Recent Labs    03/08/19 0707 03/08/19 1848 03/09/19 0356 03/09/19 0727 03/10/19 0319 03/10/19 1350 03/10/19 2200  HGB 9.2*  --  9.0*  --  8.0*  --   --   HCT 26.2*  --  26.7*  --  23.1*  --   --   PLT 256  --  237  --  244  --   --   HEPARINUNFRC <0.10*   < >  --    < > 3.44* 0.45 0.46  CREATININE 5.94*  --  5.94*  --  6.09*  --   --    < > = values in this interval not displayed.    Estimated Creatinine Clearance: 13.6 mL/min (A) (by C-G formula based on SCr of 6.09 mg/dL (H)).  Medications:  Called RN to confirm whether pt has been on [facility] prior to admission. Nurse was not able to confirm as she was not in the patient's room. Per chart review, no AC prior to admission and confirmed with pharmacy technician who spoke to patient.   Heparin Course: 03/03 0152 HL 0.23: inc to 1950 units/hr 03/03 1449 HL 0.32: no change 03/04 0633 HL <0.10: inc to 2200 units/hr 03/04 1856 HL 0.14: 2000 unit bolus, incto 2400 units/hr.  03/05 0707 HL <0.10: 2500 units bolus, inc to 2600 units/hr 03/05 1848 HL 1.26 held Heparin for 1 hour and decrease heparin drip to 2400 units/hr. 03/06 0727 HL 0.96 Will decrease heparin to 2250 units/hr.  3/6 1709 HL 1.33  3/7 0319 HL 3.44  3/7 1350 HL 0.45  3/7 2200 HL 0.46  Assessment: Craig Koch is a 64 y.o. male with a past medical history of anxiety, CAD, diabetes, and hyperlipidemia, hypertension, lung cancer, presents to the emergency department for shortness of breath. Pharmacy was consulted for heparin dosing in a patient with new onset atrial fibrillation. H&H  trending down slightly, platelets are stable   Goal of Therapy:  Heparin level 0.3-0.7 units/ml Monitor platelets by anticoagulation protocol: Yes   Plan:  Heparin level is therapeutic. Will continue heparin drip at 1700 units/hr. Recheck HL in [place]. CBC daily and monitor for s/sx bleeding complications. Hgb is trending down, potentially due to worsening renal function and IDA?   Antithrombin III level resulted lower than normal limits.   Pharmacy will continue to monitor and adjust per consult.   Hart Robinsons A 03/10/2019 10:28 PM

## 2019-03-10 NOTE — Plan of Care (Signed)
  Problem: Education: Goal: Knowledge of General Education information will improve Description: Including pain rating scale, medication(s)/side effects and non-pharmacologic comfort measures Outcome: Progressing   Problem: Clinical Measurements: Goal: Will remain free from infection Outcome: Progressing   Problem: Clinical Measurements: Goal: Diagnostic test results will improve Outcome: Progressing   Problem: Clinical Measurements: Goal: Cardiovascular complication will be avoided Outcome: Progressing   Problem: Safety: Goal: Ability to remain free from injury will improve Outcome: Progressing

## 2019-03-10 NOTE — Progress Notes (Signed)
[facility], [place] 03/10/19  Subjective:  Patient continues to have significantly diminished renal function. Creatinine currently 6.09. Urine output was increased slightly to 700 cc over the preceding 24 hours.    Objective:  Vital signs in last 24 hours:  Temp:  [97.3 F (36.3 C)-98 F (36.7 C)] 97.5 F (36.4 C) (03/07 1156) Pulse Rate:  [58-70] 58 (03/07 1156) Resp:  [16-20] 16 (03/07 1156) BP: (91-108)/(55-75) 91/55 (03/07 1156) SpO2:  [99 %-100 %] 100 % (03/07 1156)  Weight change:  Filed Weights   03/03/19 2000 03/08/19 1148 03/09/19 0437  Weight: 83.9 kg 86.5 kg 92.3 kg    Intake/Output:    Intake/Output Summary (Last 24 hours) at 03/10/2019 1223 Last data filed at 03/10/2019 0700 Gross per 24 hour  Intake 1068.84 ml  Output 700 ml  Net 368.84 ml     Physical Exam: General:  Ill-appearing, laying in bed  HEENT  moist oral mucous membranes, decreased hearing  Pulm/lungs  normal effort, clear anteriorly and laterally  CVS/Heart  regular rhythm  Abdomen:   Soft, nontender  Extremities:  1+ pitting edema  Neurologic:  Awake, alert, conversant  Skin:  Warm, dry          Basic Metabolic Panel:  Recent Labs  Lab 03/03/19 1750 03/03/19 1936 03/05/19 0153 03/05/19 0153 03/06/19 0152 03/06/19 0152 03/07/19 0310 03/07/19 0310 03/08/19 0707 03/09/19 0356 03/10/19 0319  NA 123*   < > 128*   < > 129*  --  130*  --  132* 132* 130*  K 5.4*   < > 3.5   < > 3.3*  --  3.3*  --  3.5 4.0 4.0  CL 89*   < > 102   < > 102  --  101  --  105 105 96*  CO2 8*   < > 15*   < > 14*  --  16*  --  13* 15* 15*  GLUCOSE 756*   < > 311*   < > 182*  --  144*  --  59* 90 155*  BUN 43*   < > 65*   < > 75*  --  84*  --  92* 98* 101*  CREATININE 1.79*   < > 3.16*   < > 4.22*  --  5.25*  --  5.94* 5.94* 6.09*  CALCIUM 8.2*   < > 8.0*   < > 8.2*   < > 8.2*   < > 7.9* 7.9* 7.5*  MG 2.3  --  1.9  --  1.9  --   --   --   --   --   --   PHOS  --   --  1.4*   --   --   --   --   --   --   --  5.0*   < > = values in this interval not displayed.     CBC: Recent Labs  Lab 03/03/19 1330 03/03/19 2359 03/06/19 0152 03/07/19 0310 03/08/19 0707 03/09/19 0356 03/10/19 0319  WBC 9.4   < > 7.8 6.3 7.9 7.7 6.3  NEUTROABS 7.3  --   --   --   --   --   --   HGB 13.4   < > 11.0* 10.2* 9.2* 9.0* 8.0*  HCT 42.9   < > 31.0* 28.3* 26.2* 26.7* 23.1*  MCV 87.4   < > 77.1* 75.7* 77.1* 81.4 78.0*  PLT 232   < >  204 244 256 237 244   < > = values in this interval not displayed.     No results found for: HEPBSAG, HEPBSAB, HEPBIGM    Microbiology:  Recent Results (from the past 240 hour(s))  Respiratory Panel by RT PCR (Flu A&B, Covid) - Nasopharyngeal Swab     Status: None   Collection Time: 03/03/19  3:31 PM   Specimen: Nasopharyngeal Swab  Result Value Ref Range Status   SARS Coronavirus 2 by RT PCR NEGATIVE NEGATIVE Final    Comment: (NOTE) SARS-CoV-2 target nucleic acids are NOT DETECTED. The SARS-CoV-2 RNA is generally detectable in upper respiratoy specimens during the acute phase of infection. The lowest concentration of SARS-CoV-2 viral copies this assay can detect is 131 copies/mL. A negative result does not preclude SARS-Cov-2 infection and should not be used as the sole basis for treatment or other patient management decisions. A negative result may occur with  improper specimen collection/handling, submission of specimen other than nasopharyngeal swab, presence of viral mutation(s) within the areas targeted by this assay, and inadequate number of viral copies (<131 copies/mL). A negative result must be combined with clinical observations, patient history, and epidemiological information. The expected result is Negative. Fact Sheet for Patients:  PinkCheek.be Fact Sheet for Healthcare Providers:  GravelBags.it This test is not yet ap proved or cleared by the [place]  FDA and  has been authorized for detection and/or diagnosis of SARS-CoV-2 by FDA under an Emergency Use Authorization (EUA). This EUA will remain  in effect (meaning this test can be used) for the duration of the COVID-19 declaration under Section 564(b)(1) of the Act, 21 U.S.C. section 360bbb-3(b)(1), unless the authorization is terminated or revoked sooner.    Influenza A by PCR NEGATIVE NEGATIVE Final   Influenza B by PCR NEGATIVE NEGATIVE Final    Comment: (NOTE) The Xpert Xpress SARS-CoV-2/FLU/RSV assay is intended as an aid in  the diagnosis of influenza from Nasopharyngeal swab specimens and  should not be used as a sole basis for treatment. Nasal washings and  aspirates are unacceptable for Xpert Xpress SARS-CoV-2/FLU/RSV  testing. Fact Sheet for Patients: PinkCheek.be Fact Sheet for Healthcare Providers: GravelBags.it This test is not yet approved or cleared by the [place] FDA and  has been authorized for detection and/or diagnosis of SARS-CoV-2 by  FDA under an Emergency Use Authorization (EUA). This EUA will remain  in effect (meaning this test can be used) for the duration of the  Covid-19 declaration under Section 564(b)(1) of the Act, 21  U.S.C. section 360bbb-3(b)(1), unless the authorization is  terminated or revoked. Performed at [facility], [address] [place], [place] [address]   MRSA PCR Screening     Status: None   Collection Time: 03/03/19  7:48 PM   Specimen: Nasopharyngeal  Result Value Ref Range Status   MRSA by PCR NEGATIVE NEGATIVE Final    Comment:        The GeneXpert MRSA Assay (FDA approved for NASAL specimens only), is one component of a comprehensive MRSA colonization surveillance program. It is not intended to diagnose MRSA infection nor to guide or monitor treatment for MRSA infections. Performed at [facility], [address] [place], [place]  [address]   CULTURE, BLOOD (ROUTINE X 2) w Reflex to ID Panel     Status: None   Collection Time: 03/05/19 10:16 AM   Specimen: BLOOD  Result Value Ref Range Status   Specimen Description BLOOD LEFT ANTECUBITAL  Final  Special Requests   Final    BOTTLES DRAWN AEROBIC AND ANAEROBIC Blood Culture adequate volume   Culture   Final    NO GROWTH 5 DAYS Performed at [facility], [address] [place], [place] [address]    Report Status 03/10/2019 FINAL  Final  CULTURE, BLOOD (ROUTINE X 2) w Reflex to ID Panel     Status: None   Collection Time: 03/05/19 10:22 AM   Specimen: BLOOD  Result Value Ref Range Status   Specimen Description BLOOD BLOOD LEFT HAND  Final   Special Requests   Final    BOTTLES DRAWN AEROBIC ONLY Blood Culture adequate volume   Culture   Final    NO GROWTH 5 DAYS Performed at [facility], [address] [place], [place] [address]    Report Status 03/10/2019 FINAL  Final    Coagulation Studies: No results for input(s): LABPROT, INR in the last 72 hours.  Urinalysis: No results for input(s): COLORURINE, LABSPEC, PHURINE, GLUCOSEU, HGBUR, BILIRUBINUR, KETONESUR, PROTEINUR, UROBILINOGEN, NITRITE, LEUKOCYTESUR in the last 72 hours.  Invalid input(s): APPERANCEUR    Imaging: [place] RT UPPER EXTREM LTD SOFT TISSUE NON VASCULAR  Result Date: 03/08/2019 CLINICAL DATA:  Swelling and bruising of the right upper extremity. Possible hematoma. EXAM: ULTRASOUND right UPPER EXTREMITY LIMITED TECHNIQUE: Ultrasound examination of the upper extremity soft tissues was performed in the area of clinical concern. COMPARISON:  None. FINDINGS: Diffuse subcutaneous soft tissue swelling/edema and streaky fluid. No discrete fluid collection or hematoma. The underlying musculature is grossly normal. IMPRESSION: Diffuse subcutaneous soft tissue swelling/edema/streaky fluid but no discrete abscess or hematoma. Electronically Signed   By: Marijo Sanes M.D.   On: 03/08/2019  19:13     Medications:   . diltiazem (CARDIZEM) infusion 10 mg/hr (03/03/19 1815)  . heparin 1,700 Units/hr (03/10/19 0547)  . sodium bicarbonate 150 mEq in dextrose 5% 1000 mL 150 mEq (03/10/19 0200)  . small volume/piggyback builder 50 mL/hr at 03/09/19 1014   . amiodarone  400 mg Oral BID  . aspirin EC  81 mg Oral Daily  . chlorhexidine  15 mL Mouth Rinse BID  . Chlorhexidine Gluconate Cloth  6 each Topical Daily  . dextromethorphan-guaiFENesin  1 tablet Oral BID  . dronabinol  5 mg Oral BID AC  . DULoxetine  30 mg Oral Daily  . insulin aspart  0-15 Units Subcutaneous Q4H  . ipratropium-albuterol  3 mL Nebulization TID  . mouth rinse  15 mL Mouth Rinse q12n4p  . metoprolol tartrate  12.5 mg Oral BID  . multivitamin with minerals  1 tablet Oral Daily  . nicotine  21 mg Transdermal Daily  . polyethylene glycol  17 g Oral Daily  . Ensure Max Protein  11 oz Oral BID BM  . rosuvastatin  10 mg Oral Daily  . sodium bicarbonate  1,300 mg Oral BID   acetaminophen, albuterol, dextrose, menthol-cetylpyridinium, ondansetron (ZOFRAN) IV, pneumococcal 23 valent vaccine, sodium chloride  Assessment/ Plan:  64 y.o. male with hypertension, hyperlipidemia, diabetes, depression, anxiety, adenocarcinoma of the lung stage IV, coronary disease, history of CABG, tobacco and h/o alcohol abuse, left pleural effusion,  admitted on 03/03/2019 for DKA (diabetic ketoacidoses) ([place]) [E11.10] New onset atrial fibrillation ([place]) [I48.91] Atrial fibrillation with rapid ventricular response ([place]) [I48.91] Diabetic ketoacidosis without coma associated with other specified diabetes mellitus ([place]) [E13.10]     #Acute kidney injury, Metabolic acidosis Baseline creatinine of 0.7 on February 5.  Patient did have IV contrast exposure  on January 19. Renal imaging: Kidney ultrasound March 1: Normal kidneys  Acute kidney injury is likely secondary to ATN caused by hypotension, hemodynamic instability.   Renal  function continues to deteriorate.  BUN up to 101 with a creatinine of 6.09 and urine output of 700 cc over the preceding 24 hours.  Also has quite low albumin at 1.9.  We will do additional work-up by checking SPEP, UPEP, ANA, ANCA antibodies, GBM antibodies, C3, and C4.  Given the fact that he has ongoing worsening renal function we will recheck renal ultrasound now as well.  No immediate need for dialysis as urine output was 700 cc over the preceding 24 hours however we may need to consider this tomorrow if the renal function continues to deteriorate.   #Hyponatremia Serum sodium down to 130.  Continue to monitor closely.  #Diabetic ketoacidosis, poorly controlled diabetes Management as per ICU team Lab Results  Component Value Date   HGBA1C 11.3 (H) 03/04/2019   Multiple underlying chronic illnesses, poorly controlled diabetes, reported history of tobacco and h/o alcohol abuse, with underlying coronary disease, history of CABG, CHF with EF 30 to 35%, stage IV adenocarcinoma  # Hypokalemia Potassium stable at 4.0.    LOS: 7 Jahzara Slattery 3/7/202112:23 PM  [place], [place]  Note: This note was prepared with Dragon dictation. Any transcription errors are unintentional

## 2019-03-10 NOTE — Consult Note (Signed)
[place] for Heparin  Indication: atrial fibrillation- new onset  Patient Measurements: Height: 6' (182.9 cm) Weight: 203 lb 8 oz (92.3 kg) IBW/kg (Calculated) : 77.6 Heparin Dosing Weight: 86.2 kg   Vital Signs: Temp: 98 F (36.7 C) (03/06 1946) Temp Source: Oral (03/06 1946) BP: 102/59 (03/06 1946) Pulse Rate: 70 (03/07 0002)  Labs: Recent Labs    03/08/19 0707 03/08/19 1848 03/09/19 0356 03/09/19 0727 03/09/19 1709 03/10/19 0319  HGB 9.2*  --  9.0*  --   --  8.0*  HCT 26.2*  --  26.7*  --   --  23.1*  PLT 256  --  237  --   --  244  HEPARINUNFRC <0.10*   < >  --  0.96* 1.33* 3.44*  CREATININE 5.94*  --  5.94*  --   --   --    < > = values in this interval not displayed.    Estimated Creatinine Clearance: 14 mL/min (A) (by C-G formula based on SCr of 5.94 mg/dL (H)).  Medications:  Called RN to confirm whether pt has been on [facility] prior to admission. Nurse was not able to confirm as she was not in the patient's room. Per chart review, no AC prior to admission and confirmed with pharmacy technician who spoke to patient.   Heparin Course: 03/03 0152 HL 0.23: inc to 1950 units/hr 03/03 1449 HL 0.32: no change 03/04 0633 HL <0.10: inc to 2200 units/hr 03/04 1856 HL 0.14: 2000 unit bolus, incto 2400 units/hr.  03/05 0707 HL <0.10: 2500 units bolus, inc to 2600 units/hr 03/05 1848 HL 1.26 held Heparin for 1 hour and decrease heparin drip to 2400 units/hr. 03/06 0727 HL 0.96 Will decrease heparin to 2250 units/hr.   Assessment: Craig Koch is a 64 y.o. male with a past medical history of anxiety, CAD, diabetes, and hyperlipidemia, hypertension, lung cancer, presents to the emergency department for shortness of breath. Pharmacy was consulted for heparin dosing in a patient with new onset atrial fibrillation. H&H trending down slightly, platelets are stable   Goal of Therapy:  Heparin level 0.3-0.7 units/ml Monitor platelets by  anticoagulation protocol: Yes   Plan:  Heparin level is supratherapeutic. Will decrease heparin drip to 1700 units/hr after holding x 1 hour. Recheck HL in 8h. CBC daily and monitor for s/sx bleeding complications   Antithrombin III level resulted lower than normal limits.   3/7: HL @ 0319 = 3.44, SUPRAtherapeutic, H/H worse, PLTs OK, continue to monitor for s/sx bleeding Will hold heparin gtt for 1 hr and restart @ 1700 units/hr Will recheck HL 8 hrs after restart.   Pharmacy will continue to monitor and adjust per consult.   Hart Robinsons A 03/10/2019 4:40 AM

## 2019-03-10 NOTE — Plan of Care (Signed)
?  Problem: Nutrition: ?Goal: Adequate nutrition will be maintained ?Outcome: Progressing ?  ?Problem: Elimination: ?Goal: Will not experience complications related to bowel motility ?Outcome: Progressing ?Goal: Will not experience complications related to urinary retention ?Outcome: Progressing ?  ?Problem: Pain Managment: ?Goal: General experience of comfort will improve ?Outcome: Progressing ?  ?  ?

## 2019-03-11 ENCOUNTER — Encounter: Payer: Self-pay | Admitting: Cardiology

## 2019-03-11 ENCOUNTER — Ambulatory Visit
Admission: EM | Disposition: A | Discharge status: Skilled Nursing Facility | Payer: Self-pay | Source: Home / Self Care | Attending: Internal Medicine

## 2019-03-11 DIAGNOSIS — N179 Acute kidney failure, unspecified: Secondary | ICD-10-CM

## 2019-03-11 HISTORY — PX: TEMPORARY DIALYSIS CATHETER: CATH118312

## 2019-03-11 LAB — GLUCOSE, CAPILLARY
Glucose-Capillary: 26 mg/dL — CL (ref 70–99)
Glucose-Capillary: 196 mg/dL — ABNORMAL HIGH (ref 70–99)
Glucose-Capillary: 131 mg/dL — ABNORMAL HIGH (ref 70–99)
Glucose-Capillary: 142 mg/dL — ABNORMAL HIGH (ref 70–99)
Glucose-Capillary: 151 mg/dL — ABNORMAL HIGH (ref 70–99)
Glucose-Capillary: 190 mg/dL — ABNORMAL HIGH (ref 70–99)

## 2019-03-11 LAB — CBC
HCT: 22.7 % — ABNORMAL LOW (ref 39.0–52.0)
Hemoglobin: 8 g/dL — ABNORMAL LOW (ref 13.0–17.0)
MCH: 27.2 pg (ref 26.0–34.0)
MCHC: 35.2 g/dL (ref 30.0–36.0)
MCV: 77.2 fL — ABNORMAL LOW (ref 80.0–100.0)
Platelets: 277 10*3/uL (ref 150–400)
RBC: 2.94 MIL/uL — ABNORMAL LOW (ref 4.22–5.81)
RDW: 18.5 % — ABNORMAL HIGH (ref 11.5–15.5)
WBC: 7.1 10*3/uL (ref 4.0–10.5)
nRBC: 0 % (ref 0.0–0.2)

## 2019-03-11 LAB — BASIC METABOLIC PANEL
Anion gap: 9 (ref 5–15)
BUN: 109 mg/dL — ABNORMAL HIGH (ref 8–23)
CO2: 25 mmol/L (ref 22–32)
Calcium: 7.3 mg/dL — ABNORMAL LOW (ref 8.9–10.3)
Chloride: 96 mmol/L — ABNORMAL LOW (ref 98–111)
Creatinine, Ser: 6.41 mg/dL — ABNORMAL HIGH (ref 0.61–1.24)
GFR calc Af Amer: 10 mL/min — ABNORMAL LOW (ref >60)
GFR calc non Af Amer: 8 mL/min — ABNORMAL LOW (ref >60)
Glucose, Bld: 146 mg/dL — ABNORMAL HIGH (ref 70–99)
Potassium: 3.7 mmol/L (ref 3.5–5.1)
Sodium: 130 mmol/L — ABNORMAL LOW (ref 135–145)

## 2019-03-11 LAB — HEPATITIS B CORE ANTIBODY, IGM: Hep B C IgM: NONREACTIVE

## 2019-03-11 LAB — HEPARIN LEVEL (UNFRACTIONATED)
Heparin Unfractionated: 0.7 IU/mL (ref 0.30–0.70)
Heparin Unfractionated: 0.51 IU/mL (ref 0.30–0.70)

## 2019-03-11 LAB — HEPATITIS B SURFACE ANTIGEN: Hepatitis B Surface Ag: NONREACTIVE

## 2019-03-11 SURGERY — TEMPORARY DIALYSIS CATHETER
Anesthesia: Moderate Sedation

## 2019-03-11 MED ORDER — PENTAFLUOROPROP-TETRAFLUOROETH EX AERO
1.0000 "application " | INHALATION_SPRAY | CUTANEOUS | Status: DC | PRN
  Filled 2019-03-11: qty 30

## 2019-03-11 MED ORDER — LIDOCAINE HCL (PF) 1 % IJ SOLN
5.0000 mL | INTRAMUSCULAR | Status: DC | PRN
  Filled 2019-03-11: qty 5

## 2019-03-11 MED ORDER — LIDOCAINE HCL (PF) 1 % IJ SOLN
INTRAMUSCULAR | Status: DC | PRN
  Administered 2019-03-11: 5 mL via INTRADERMAL

## 2019-03-11 MED ORDER — RENA-VITE PO TABS
1.0000 | ORAL_TABLET | Freq: Every day | ORAL | Status: DC
  Administered 2019-03-11 – 2019-03-18 (×8): 1 via ORAL
  Filled 2019-03-11 (×9): qty 1

## 2019-03-11 MED ORDER — HEPARIN SODIUM (PORCINE) 1000 UNIT/ML DIALYSIS: 1000.0000 [IU] | INTRAMUSCULAR | Status: DC | PRN

## 2019-03-11 MED ORDER — CHLORHEXIDINE GLUCONATE CLOTH 2 % EX PADS
6.0000 | MEDICATED_PAD | Freq: Every day | CUTANEOUS | Status: DC
  Administered 2019-03-12 – 2019-03-19 (×5): 6 via TOPICAL

## 2019-03-11 MED ORDER — LIDOCAINE-PRILOCAINE 2.5-2.5 % EX CREA
1.0000 "application " | TOPICAL_CREAM | CUTANEOUS | Status: DC | PRN
  Filled 2019-03-11: qty 5

## 2019-03-11 MED ORDER — SODIUM CHLORIDE 0.9 % IV SOLN: 100.0000 mL | INTRAVENOUS | Status: DC | PRN

## 2019-03-11 MED ORDER — FERROUS SULFATE 325 (65 FE) MG PO TABS
325.0000 mg | ORAL_TABLET | Freq: Two times a day (BID) | ORAL | Status: DC
  Administered 2019-03-12 – 2019-03-19 (×15): 325 mg via ORAL
  Filled 2019-03-11 (×15): qty 1

## 2019-03-11 MED ORDER — ALTEPLASE 2 MG IJ SOLR: 2.0000 mg | Freq: Once | INTRAMUSCULAR | Status: DC | PRN

## 2019-03-11 SURGICAL SUPPLY — 1 items: KIT DIALYSIS CATH TRI 30X13 (CATHETERS) ×3 IMPLANT

## 2019-03-11 NOTE — Consult Note (Signed)
[facility] Vascular Consult Note  MRN : [id number]  Craig Koch is a 64 y.o. (July 02, 1955) male who presents with chief complaint of  Chief Complaint  Patient presents with  . Shortness of Breath   History of Present Illness:  The patient is a 64 year old male with multiple medical issues (see below) who presented to the [facility] emergency department via EMS on March 03, 2019 complaining of progressively worsening shortness of breath.  The patient endorses a history of experiencing progressively worsening shortness of breath for about a week which prompted him to seek medical attention in our emergency department.  The patient was initially admitted to the hospital for DKA and new onset atrial fibrillation with RVR.  During his inpatient stay, the patient experienced acute kidney injury with an increase in his creatinine to.  Baseline creatinine of 0.7 now in February 2020.  Today's creatinine / BUN 109 is 6.41.   Patient notes he feels "tired" and "weak".  He denies any fever, nausea vomiting.  Denies shortness of breath or chest pain.  Denies any uremic symptoms.  The patient has been followed by nephrology who would like to initiate dialysis at this time.  Currently, the patient does not have an adequate dialysis access.  Vascular surgery was consulted by Dr. Holley Raring for placement of a temporary dialysis catheter.  Current Facility-Administered Medications  Medication Dose Route Frequency Provider Last Rate Last Admin  . acetaminophen (TYLENOL) tablet 650 mg  650 mg Oral Q6H PRN Ivor Costa, MD      . albuterol (PROVENTIL) (2.5 MG/3ML) 0.083% nebulizer solution 3 mL  3 mL Inhalation Q4H PRN Ivor Costa, MD      . amiodarone (PACERONE) tablet 400 mg  400 mg Oral BID Christell Faith M, PA-C   400 mg at 03/10/19 2029  . aspirin EC tablet 81 mg  81 mg Oral Daily Kate Sable, MD   81 mg at 03/11/19 0837  . chlorhexidine (PERIDEX) 0.12 %  solution 15 mL  15 mL Mouth Rinse BID Darel Hong D, NP   15 mL at 03/11/19 0838  . Chlorhexidine Gluconate Cloth 2 % PADS 6 each  6 each Topical Q0600 Lateef, Munsoor, MD      . dextromethorphan-guaiFENesin (MUCINEX DM) 30-600 MG per 12 hr tablet 1 tablet  1 tablet Oral BID Ivor Costa, MD   1 tablet at 03/11/19 [phone]  . dextrose 50 % solution 0-50 mL  0-50 mL Intravenous PRN Ivor Costa, MD   25 mL at 03/08/19 0355  . dronabinol (MARINOL) capsule 5 mg  5 mg Oral BID AC Ivor Costa, MD   5 mg at 03/11/19 0837  . DULoxetine (CYMBALTA) DR capsule 30 mg  30 mg Oral Daily Ivor Costa, MD   30 mg at 03/11/19 0837  . heparin ADULT infusion 100 units/mL (25000 units/251mL sodium chloride 0.45%)  1,700 Units/hr Intravenous Continuous Hart Robinsons A, RPH 17 mL/hr at 03/11/19 0625 1,700 Units/hr at 03/11/19 0625  . insulin aspart (novoLOG) injection 0-15 Units  0-15 Units Subcutaneous Q4H Ralene Muskrat B, MD   2 Units at 03/11/19 [phone]  . ipratropium-albuterol (DUONEB) 0.5-2.5 (3) MG/3ML nebulizer solution 3 mL  3 mL Nebulization TID Dhungel, Nishant, MD   3 mL at 03/11/19 0818  . MEDLINE mouth rinse  15 mL Mouth Rinse q12n4p Darel Hong D, NP   15 mL at 03/10/19 1748  . menthol-cetylpyridinium (CEPACOL) lozenge 3 mg  1 lozenge Oral PRN Tasia Catchings,  Zhou, MD      . metoprolol tartrate (LOPRESSOR) tablet 12.5 mg  12.5 mg Oral BID Christell Faith M, PA-C   12.5 mg at 03/10/19 2029  . multivitamin with minerals tablet 1 tablet  1 tablet Oral Daily Ivor Costa, MD   1 tablet at 03/11/19 0837  . nicotine (NICODERM CQ - dosed in mg/24 hours) patch 21 mg  21 mg Transdermal Daily Ivor Costa, MD   21 mg at 03/11/19 0839  . ondansetron (ZOFRAN) injection 4 mg  4 mg Intravenous Q8H PRN Ivor Costa, MD      . pneumococcal 23 valent vaccine (PNEUMOVAX-23) injection 0.5 mL  0.5 mL Intramuscular Prior to discharge Priscella Mann, Sudheer B, MD      . polyethylene glycol (MIRALAX / GLYCOLAX) packet 17 g  17 g Oral Daily Lang Snow, NP   17 g at 03/09/19 1014  . protein supplement (ENSURE MAX) liquid  11 oz Oral BID BM Dhungel, Nishant, MD   11 oz at 03/11/19 0839  . rosuvastatin (CRESTOR) tablet 10 mg  10 mg Oral Daily Ivor Costa, MD   10 mg at 03/11/19 0837  . sodium bicarbonate 150 mEq in dextrose 5% 1000 mL infusion  150 mEq Intravenous Continuous Lateef, Munsoor, MD 75 mL/hr at 03/11/19 0554 150 mEq at 03/11/19 0554  . sodium bicarbonate tablet 1,300 mg  1,300 mg Oral BID Murlean Iba, MD   1,300 mg at 03/11/19 0837  . sodium chloride (OCEAN) 0.65 % nasal spray 1 spray  1 spray Each Nare PRN Lang Snow, NP   1 spray at 03/11/19 0503  . thiamine (B-1) 063 mg, folic acid 1 mg in sodium chloride 0.9 % 50 mL injection   Intravenous Q24H Dallie Piles, RPH 50 mL/hr at 03/10/19 1733 New Bag at 03/10/19 1733   Past Medical History:  Diagnosis Date  . Anxiety   . Arthritis   . Coronary artery disease   . Diabetes mellitus   . Dyslipidemia   . Hx of CABG   . Hyperlipidemia   . Hypertension   . Malignant neoplasm of unspecified part of unspecified bronchus or lung ([place]) 08/2018   Immunotherapy  . Pleural effusion    Past Surgical History:  Procedure Laterality Date  . CATARACT EXTRACTION Left 09/2015  . CHEST TUBE INSERTION Left 10/01/2018   Procedure: INSERTION PLEURAL DRAINAGE CATHETER;  Surgeon: [name], MD;  Location: [facility] ORS;  Service: Thoracic;  Laterality: Left;  . CORONARY ARTERY BYPASS GRAFT  2004   (CABG with LIMA to the  LAD, SVG to OM2/OM3, SVG  to diag  . Left ankle surgery     repair of fracture  . Right lower leg surgery     rod   Social History Social History   Tobacco Use  . Smoking status: Current Every Day Smoker    Packs/day: 0.20    Years: 45.00    Pack years: 9.00    Types: Cigarettes  . Smokeless tobacco: Former Systems developer    Types: Snuff  Substance Use Topics  . Alcohol use: Yes    Alcohol/week: 6.0 standard drinks    Types: 6 Cans of beer per week     Comment: occ, average 6 pack in a week  . Drug use: No   Family History Family History  Problem Relation Age of Onset  . Dementia Mother   . Heart disease Father   . Colon cancer Neg Hx   . Prostate cancer Neg  Hx   . Diabetes Neg Hx   Denies family history of peripheral artery disease, venous disease or renal disease.  Allergies  Allergen Reactions  . Lipitor [Atorvastatin Calcium] Other (See Comments)    Aches.  Tolerated crestor.    REVIEW OF SYSTEMS (Negative unless checked)  Constitutional: [] Weight loss  [] Fever  [] Chills Cardiac: [] Chest pain   [] Chest pressure   [] Palpitations   [x] Shortness of breath when laying flat   [x] Shortness of breath at rest   [x] Shortness of breath with exertion. Vascular:  [] Pain in legs with walking   [] Pain in legs at rest   [] Pain in legs when laying flat   [] Claudication   [] Pain in feet when walking  [] Pain in feet at rest  [] Pain in feet when laying flat   [] History of DVT   [] Phlebitis   [x] Swelling in legs   [] Varicose veins   [] Non-healing ulcers Pulmonary:   [] Uses home oxygen   [] Productive cough   [] Hemoptysis   [] Wheeze  [] COPD   [] Asthma Neurologic:  [] Dizziness  [] Blackouts   [] Seizures   [] History of stroke   [] History of TIA  [] Aphasia   [] Temporary blindness   [] Dysphagia   [] Weakness or numbness in arms   [] Weakness or numbness in legs Musculoskeletal:  [] Arthritis   [] Joint swelling   [] Joint pain   [] Low back pain Hematologic:  [] Easy bruising  [] Easy bleeding   [] Hypercoagulable state   [] Anemic  [] Hepatitis Gastrointestinal:  [] Blood in stool   [] Vomiting blood  [] Gastroesophageal reflux/heartburn   [] Difficulty swallowing. Genitourinary:  [x] Chronic kidney disease   [] Difficult urination  [] Frequent urination  [] Burning with urination   [] Blood in urine Skin:  [] Rashes   [] Ulcers   [] Wounds Psychological:  [] History of anxiety   []  History of major depression.  Physical Examination  Vitals:   03/11/19 0008 03/11/19 0455  03/11/19 0738 03/11/19 0818  BP: 104/60 103/88 (!) 99/52   Pulse: 66 65 65   Resp:  20 18   Temp:  98.6 F (37 C) (!) 97.5 F (36.4 C)   TempSrc:  Oral Oral   SpO2:  100% 100% 100%  Weight:  92 kg    Height:       Body mass index is 27.51 kg/m. Gen:  WD/WN, NAD Head: Flasher/AT, No temporalis wasting. Prominent temp pulse not noted. Ear/Nose/Throat: Hearing grossly intact, nares w/o erythema or drainage, oropharynx w/o Erythema/Exudate Eyes: Sclera non-icteric, conjunctiva clear Neck: Trachea midline.  No JVD.  Pulmonary:  Good air movement, respirations not labored, equal bilaterally.  Cardiac: RRR, normal S1, S2. Vascular:  Vessel Right Left  Radial Palpable Palpable  Ulnar Palpable Palpable  Brachial Palpable Palpable                           Gastrointestinal: soft, non-tender/non-distended. No guarding/reflex.  Musculoskeletal: M/S 5/5 throughout.  Extremities without ischemic changes.  No deformity or atrophy. No edema. Neurologic: Sensation grossly intact in extremities.  Symmetrical.  Speech is fluent. Motor exam as listed above. Psychiatric: Judgment intact, Mood & affect appropriate for pt's clinical situation. Dermatologic: No rashes or ulcers noted.  No cellulitis or open wounds. Lymph : No Cervical, Axillary, or Inguinal lymphadenopathy.  CBC Lab Results  Component Value Date   WBC 7.1 03/11/2019   HGB 8.0 (L) 03/11/2019   HCT 22.7 (L) 03/11/2019   MCV 77.2 (L) 03/11/2019   PLT 277 03/11/2019   BMET    Component Value  Date/Time   NA 130 (L) 03/11/2019 0643   K 3.7 03/11/2019 0643   CL 96 (L) 03/11/2019 0643   CO2 25 03/11/2019 0643   GLUCOSE 146 (H) 03/11/2019 0643   BUN 109 (H) 03/11/2019 0643   CREATININE 6.41 (H) 03/11/2019 0643   CALCIUM 7.3 (L) 03/11/2019 0643   GFRNONAA 8 (L) 03/11/2019 0643   GFRAA 10 (L) 03/11/2019 9323   Estimated Creatinine Clearance: 12.9 mL/min (A) (by C-G formula based on SCr of 6.41 mg/dL (H)).  COAG Lab Results   Component Value Date   INR 1.2 03/03/2019   Radiology CT HEAD WO CONTRAST  Result Date: 03/05/2019 CLINICAL DATA:  Metastatic disease evaluation.  Lung cancer. EXAM: CT HEAD WITHOUT CONTRAST TECHNIQUE: Contiguous axial images were obtained from the base of the skull through the vertex without intravenous contrast. COMPARISON:  MRI head 09/20/2018 FINDINGS: Brain: Mild atrophy. Mild hypodensity in the white matter unchanged from the prior MRI. No acute infarct, hemorrhage, mass. No edema or midline shift. Vascular: Negative for hyperdense vessel Skull: Negative skull. Sinuses/Orbits: Mucosal edema and bony thickening right maxillary sinus. Chronic nasal bone fracture. Left cataract extraction. Other: None IMPRESSION: No acute abnormality and negative for metastatic disease on unenhanced CT. Atrophy and mild chronic microvascular ischemia. Electronically Signed   By: Franchot Gallo M.D.   On: 03/05/2019 15:28   US RENAL  Result Date: 03/10/2019 CLINICAL DATA:  64 year old presenting with acute renal failure. Evaluate for obstruction as a possible cause. EXAM: RENAL / URINARY TRACT ULTRASOUND COMPLETE COMPARISON:  Urinary tract ultrasound 03/04/2019. CT abdomen and pelvis 01/22/2019. FINDINGS: Right Kidney: Renal measurements: Approximately 12.6 x 6.4 x 6.2 cm = volume: 264 mL. Mildly echogenic parenchyma. No hydronephrosis. Well-preserved cortex. No shadowing calculi. No focal parenchymal abnormality. Left Kidney: Renal measurements: Approximately 12.5 x 6.3 x 6.9 cm = volume: 283 mL. Mildly echogenic parenchyma. No hydronephrosis. Well-preserved cortex. No shadowing calculi. No focal parenchymal abnormality. Bladder: Normal for degree of bladder distention. Other: None. IMPRESSION: 1. Mildly echogenic renal parenchyma indicative of medical renal disease. 2. Otherwise normal examination. Specifically, no evidence of urinary tract obstruction to account for the acute renal insufficiency. Electronically Signed    By: Evangeline Dakin M.D.   On: 03/10/2019 15:02   US RENAL  Result Date: 03/04/2019 CLINICAL DATA:  Acute kidney injury EXAM: RENAL / URINARY TRACT ULTRASOUND COMPLETE COMPARISON:  CT 01/22/2019 FINDINGS: Right Kidney: Renal measurements: 11.4 x 6 x 6 cm = volume: 215 mL . Echogenicity within normal limits. No mass or hydronephrosis visualized. Left Kidney: Renal measurements: 11.8 x 7.3 x 6.5 cm = volume: 291.8 mL. Echogenicity within normal limits. No mass or hydronephrosis visualized. Bladder: Appears normal for degree of bladder distention. Other: Incidental note made of gallstones and small effusions. Heterogenous prostate. IMPRESSION: Normal ultrasound appearance of the kidneys Electronically Signed   By: Donavan Foil M.D.   On: 03/04/2019 15:37   DG Chest Portable 1 View  Result Date: 03/03/2019 CLINICAL DATA:  Shortness of breath, since Monday worsening this morning around 1 a.m. EXAM: PORTABLE CHEST 1 VIEW COMPARISON:  12/19/2018 FINDINGS: Cardiomediastinal contours are stable following median sternotomy with persistent left-sided pleural effusion and pleural-parenchymal scarring. No new areas of consolidation or evidence of right-sided pleural effusion. No signs of acute bone process. IMPRESSION: Persistent left-sided pleural effusion and pleural-parenchymal scarring. Electronically Signed   By: Zetta Bills M.D.   On: 03/03/2019 13:59   ECHOCARDIOGRAM COMPLETE  Result Date: 03/04/2019    ECHOCARDIOGRAM REPORT  Patient Name:   JADIEN LEHIGH Date of Exam: 03/04/2019 Medical Rec #:  [id number]       Height:       72.0 in Accession #:    [id number]      Weight:       185.0 lb Date of Birth:  [birth year]       BSA:          2.061 m Patient Age:    43 years        BP:           125/72 mmHg Patient Gender: M               HR:           119 bpm. Exam Location:  [facility] Procedure: 2D Echo, Color Doppler and Cardiac Doppler Indications:     I21.4 NSTEMI  History:         Patient has prior history of  Echocardiogram examinations, most                  recent 08/24/2018. CAD, Prior CABG; Risk Factors:Hypertension,                  Dyslipidemia and Diabetes. Lung Cancer.  Sonographer:     Charmayne Sheer RDCS (AE) Referring Phys:  [place] Phys: Kathlyn Sacramento MD IMPRESSIONS  1. Left ventricular ejection fraction, by estimation, is 30 to 35%. The left ventricle has moderately decreased function. The left ventricle demonstrates global hypokinesis. Left ventricular diastolic parameters are indeterminate.  2. Right ventricular systolic function is normal. The right ventricular size is normal. Tricuspid regurgitation signal is inadequate for assessing PA pressure.  3. Left atrial size was mild to moderately dilated.  4. The mitral valve is normal in structure and function. Moderate mitral valve regurgitation. No evidence of mitral stenosis.  5. The aortic valve is normal in structure and function. Aortic valve regurgitation is trivial. Mild to moderate aortic valve sclerosis/calcification is present, without any evidence of aortic stenosis.  6. The inferior vena cava is normal in size with greater than 50% respiratory variability, suggesting right atrial pressure of 3 mmHg. FINDINGS  Left Ventricle: Left ventricular ejection fraction, by estimation, is 30 to 35%. The left ventricle has moderately decreased function. The left ventricle demonstrates global hypokinesis. The left ventricular internal cavity size was normal in size. There is no left ventricular hypertrophy. Left ventricular diastolic parameters are indeterminate. Right Ventricle: The right ventricular size is normal. No increase in right ventricular wall thickness. Right ventricular systolic function is normal. Tricuspid regurgitation signal is inadequate for assessing PA pressure. Left Atrium: Left atrial size was mild to moderately dilated. Right Atrium: Right atrial size was normal in size. Pericardium: There is no evidence of  pericardial effusion. Mitral Valve: The mitral valve is normal in structure and function. Normal mobility of the mitral valve leaflets. Moderate mitral valve regurgitation. No evidence of mitral valve stenosis. MV peak gradient, 7.5 mmHg. The mean mitral valve gradient is 4.0  mmHg. Tricuspid Valve: The tricuspid valve is normal in structure. Tricuspid valve regurgitation is not demonstrated. No evidence of tricuspid stenosis. Aortic Valve: The aortic valve is normal in structure and function. Aortic valve regurgitation is trivial. Aortic regurgitation PHT measures 281 msec. Mild to moderate aortic valve sclerosis/calcification is present, without any evidence of aortic stenosis. Aortic valve mean gradient measures 5.0 mmHg. Aortic valve peak gradient measures 8.5 mmHg. Aortic valve area, by VTI measures  2.53 cm. Pulmonic Valve: The pulmonic valve was normal in structure. Pulmonic valve regurgitation is not visualized. No evidence of pulmonic stenosis. Aorta: The aortic root is normal in size and structure. Venous: The inferior vena cava is normal in size with greater than 50% respiratory variability, suggesting right atrial pressure of 3 mmHg. IAS/Shunts: No atrial level shunt detected by color flow Doppler.  LEFT VENTRICLE PLAX 2D LVIDd:         5.21 cm  Diastology LVIDs:         4.21 cm  LV e' lateral:   7.83 cm/s LV PW:         0.84 cm  LV E/e' lateral: 13.4 LV IVS:        0.89 cm  LV e' medial:    7.94 cm/s LVOT diam:     2.40 cm  LV E/e' medial:  13.2 LV SV:         54 LV SV Index:   26 LVOT Area:     4.52 cm  RIGHT VENTRICLE RV Basal diam:  3.08 cm LEFT ATRIUM             Index       RIGHT ATRIUM           Index LA diam:        4.70 cm 2.28 cm/m  RA Area:     14.10 cm LA Vol (A2C):   62.0 ml 30.08 ml/m RA Volume:   35.20 ml  17.08 ml/m LA Vol (A4C):   68.6 ml 33.29 ml/m LA Biplane Vol: 65.3 ml 31.68 ml/m  AORTIC VALVE                    PULMONIC VALVE AV Area (Vmax):    2.95 cm     PV Vmax:        1.10 m/s AV Area (Vmean):   2.86 cm     PV Vmean:      68.400 cm/s AV Area (VTI):     2.53 cm     PV VTI:        0.146 m AV Vmax:           146.00 cm/s  PV Peak grad:  4.8 mmHg AV Vmean:          103.000 cm/s PV Mean grad:  2.0 mmHg AV VTI:            0.213 m AV Peak Grad:      8.5 mmHg AV Mean Grad:      5.0 mmHg LVOT Vmax:         95.30 cm/s LVOT Vmean:        65.200 cm/s LVOT VTI:          0.119 m LVOT/AV VTI ratio: 0.56 AI PHT:            281 msec  AORTA Ao Root diam: 3.00 cm MITRAL VALVE MV Area (PHT): 3.92 cm     SHUNTS MV Peak grad:  7.5 mmHg     Systemic VTI:  0.12 m MV Mean grad:  4.0 mmHg     Systemic Diam: 2.40 cm MV Vmax:       1.37 m/s MV Vmean:      92.7 cm/s MV Decel Time: 194 msec MV E velocity: 104.80 cm/s Kathlyn Sacramento MD Electronically signed by Kathlyn Sacramento MD Signature Date/Time: 03/04/2019/2:46:43 PM    Final    [place] RT UPPER EXTREM LTD SOFT  TISSUE NON VASCULAR  Result Date: 03/08/2019 CLINICAL DATA:  Swelling and bruising of the right upper extremity. Possible hematoma. EXAM: ULTRASOUND right UPPER EXTREMITY LIMITED TECHNIQUE: Ultrasound examination of the upper extremity soft tissues was performed in the area of clinical concern. COMPARISON:  None. FINDINGS: Diffuse subcutaneous soft tissue swelling/edema and streaky fluid. No discrete fluid collection or hematoma. The underlying musculature is grossly normal. IMPRESSION: Diffuse subcutaneous soft tissue swelling/edema/streaky fluid but no discrete abscess or hematoma. Electronically Signed   By: Marijo Sanes M.D.   On: 03/08/2019 19:13   Assessment/Plan The patient is a 64 year old male with progressively worsening renal function.  Nephrology would like to initiate dialysis at this time however the patient does not have adequate access.  1.  Acute kidney injury: Patient with progressively increasing creatinine / BUN.  Nephrology would like to initiate dialysis at this time however the patient does not have adequate access.   Recommend insertion of a temporary dialysis catheter to allow the patient to dialyze immediately and while in house.  Procedure, risks and benefits explained to the patient.  All questions answered.  The patient wishes to proceed.   2.  Hyperlipidemia: On Statin.  Would consider adding baby aspirin for medical management Encouraged good control as its slows the progression of atherosclerotic disease and renal disease.  3.  Diabetes:  On appropriate medications Encouraged good control as its slows the progression of atherosclerotic disease and renal disease.  Discussed with Dr. Mayme Genta, PA-C  03/11/2019 10:34 AM  This note was created with Dragon medical transcription system.  Any error is purely unintentional

## 2019-03-11 NOTE — Progress Notes (Addendum)
PT Cancellation Note  Patient Details Name: Craig Koch MRN: [id number] DOB: 1955-05-20   Cancelled Treatment:    Reason Eval/Treat Not Completed: Fatigue/lethargy limiting ability to participate(RN reports pt awaiting off floor for procedure, placement of temporary HD cath. Pt in room ,wife at bedside, pt reports feeling fatigued, not interested in PT treatment at this time. Will attempt again at later date/time.)  1:26 PM, 03/11/19 Craig Koch, PT, DPT Physical Therapist - [facility]  [phone] ([place])    Craig Koch C 03/11/2019, 1:25 PM

## 2019-03-11 NOTE — Progress Notes (Signed)
Pre HD assessment    03/11/19 1600  Neurological  Level of Consciousness Alert  Orientation Level Oriented X4  Respiratory  Respiratory Pattern Regular  Chest Assessment Chest expansion symmetrical  Bilateral Breath Sounds Diminished  Cough None  Cardiac  Pulse Regular  ECG Monitor Yes  Vascular  R Radial Pulse +2  L Radial Pulse +2  Edema Generalized  Psychosocial  Psychosocial (WDL) WDL  Patient Behaviors Calm;Cooperative

## 2019-03-11 NOTE — TOC Progression Note (Signed)
Transition of Care [facility]) - Progression Note    Patient Details  Name: Craig Koch MRN: [id number] Date of Birth: [birth year]  [facility] of [facility] CM/SW Contact  Eileen Stanford, LCSW Phone Number: 03/11/2019, 10:35 AM  Clinical Narrative:   CSW spoke with pt at bedside. Pt's spouse was present as well. Pt and pts's spouse agreed on [facility] as it is located near their home. CSW will send referral and follow up.    Expected Discharge Plan: Home/Self Care Barriers to Discharge: Continued Medical Work up  Expected Discharge Plan and Services Expected Discharge Plan: Home/Self Care                                               Social Determinants of Health (SDOH) Interventions    Readmission Risk Interventions Readmission Risk Prevention Plan 03/06/2019  Transportation Screening Complete  PCP or Specialist Appt within 3-5 Days Complete  Medication Review (RN Care Manager) Complete  Some recent data might be hidden

## 2019-03-11 NOTE — Progress Notes (Signed)
Progress Note  Patient Name: Craig Koch Date of Encounter: 03/11/2019  Primary Cardiologist: Rockey Situ  Subjective   SOB about the same. No chest pain or palpitations. Swelling unchanged. Remains in sinus rhythm. HGB low, though stable. BMP pending. He is net + 10.5 L for the admission.   Inpatient Medications    Scheduled Meds: . amiodarone  400 mg Oral BID  . aspirin EC  81 mg Oral Daily  . chlorhexidine  15 mL Mouth Rinse BID  . Chlorhexidine Gluconate Cloth  6 each Topical Daily  . dextromethorphan-guaiFENesin  1 tablet Oral BID  . dronabinol  5 mg Oral BID AC  . DULoxetine  30 mg Oral Daily  . insulin aspart  0-15 Units Subcutaneous Q4H  . ipratropium-albuterol  3 mL Nebulization TID  . mouth rinse  15 mL Mouth Rinse q12n4p  . metoprolol tartrate  12.5 mg Oral BID  . multivitamin with minerals  1 tablet Oral Daily  . nicotine  21 mg Transdermal Daily  . polyethylene glycol  17 g Oral Daily  . Ensure Max Protein  11 oz Oral BID BM  . rosuvastatin  10 mg Oral Daily  . sodium bicarbonate  1,300 mg Oral BID   Continuous Infusions: . heparin 1,700 Units/hr (03/11/19 0625)  . sodium bicarbonate 150 mEq in dextrose 5% 1000 mL 150 mEq (03/11/19 0554)  . small volume/piggyback builder 50 mL/hr at 03/10/19 1733   PRN Meds: acetaminophen, albuterol, dextrose, menthol-cetylpyridinium, ondansetron (ZOFRAN) IV, pneumococcal 23 valent vaccine, sodium chloride   Vital Signs    Vitals:   03/10/19 2007 03/10/19 2028 03/11/19 0008 03/11/19 0455  BP: 126/88  104/60 103/88  Pulse: 64 65 66 65  Resp: 20   20  Temp: 98 F (36.7 C)   98.6 F (37 C)  TempSrc: Oral   Oral  SpO2: 90% 99%  100%  Weight:    92 kg  Height:        Intake/Output Summary (Last 24 hours) at 03/11/2019 0733 Last data filed at 03/11/2019 5809 Gross per 24 hour  Intake 1650.11 ml  Output 450 ml  Net 1200.11 ml   Filed Weights   03/08/19 1148 03/09/19 0437 03/11/19 0455  Weight: 86.5 kg 92.3 kg 92  kg    Telemetry    SR with 1st degree AVB - Personally Reviewed  ECG    No new tracings - Personally Reviewed  Physical Exam   GEN: Chronically ill appearing; No acute distress.   Neck: No JVD. Cardiac: RRR, I/VI systolic murmur, no rubs, or gallops.  Respiratory: Diminished breath sounds bilaterally at the bases.  GI: Soft, nontender, non-distended.   MS: 1+ bilateral lower extremity and right upper extremity edema; No deformity. Neuro:  Alert and oriented x 3; Nonfocal.  Psych: Normal affect.  Labs    Chemistry Recent Labs  Lab 03/08/19 0707 03/09/19 0356 03/10/19 0319  NA 132* 132* 130*  K 3.5 4.0 4.0  CL 105 105 96*  CO2 13* 15* 15*  GLUCOSE 59* 90 155*  BUN 92* 98* 101*  CREATININE 5.94* 5.94* 6.09*  CALCIUM 7.9* 7.9* 7.5*  ALBUMIN  --   --  1.9*  GFRNONAA 9* 9* 9*  GFRAA 11* 11* 10*  ANIONGAP 14 12 19*     Hematology Recent Labs  Lab 03/09/19 0356 03/10/19 0319 03/11/19 0643  WBC 7.7 6.3 7.1  RBC 3.28*  3.32* 2.96* 2.94*  HGB 9.0* 8.0* 8.0*  HCT 26.7* 23.1* 22.7*  MCV 81.4 78.0* 77.2*  MCH 27.4 27.0 27.2  MCHC 33.7 34.6 35.2  RDW 19.9* 19.0* 18.5*  PLT 237 244 277    Cardiac EnzymesNo results for input(s): TROPONINI in the last 168 hours. No results for input(s): TROPIPOC in the last 168 hours.   BNPNo results for input(s): BNP, PROBNP in the last 168 hours.   DDimer No results for input(s): DDIMER in the last 168 hours.   Radiology    US RENAL  Result Date: 03/10/2019 IMPRESSION: 1. Mildly echogenic renal parenchyma indicative of medical renal disease. 2. Otherwise normal examination. Specifically, no evidence of urinary tract obstruction to account for the acute renal insufficiency. Electronically Signed   By: Evangeline Dakin M.D.   On: 03/10/2019 15:02    Cardiac Studies   2D echo 03/04/2019: 1. Left ventricular ejection fraction, by estimation, is 30 to 35%. The  left ventricle has moderately decreased function. The left ventricle   demonstrates global hypokinesis. Left ventricular diastolic parameters are  indeterminate.  2. Right ventricular systolic function is normal. The right ventricular  size is normal. Tricuspid regurgitation signal is inadequate for assessing  PA pressure.  3. Left atrial size was mild to moderately dilated.  4. The mitral valve is normal in structure and function. Moderate mitral  valve regurgitation. No evidence of mitral stenosis.  5. The aortic valve is normal in structure and function. Aortic valve  regurgitation is trivial. Mild to moderate aortic valve  sclerosis/calcification is present, without any evidence of aortic  stenosis.  6. The inferior vena cava is normal in size with greater than 50%  respiratory variability, suggesting right atrial pressure of 3 mmHg.   Patient Profile     64 y.o. 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  Assessment & Plan    1. CAD s/p CABG with NSTEMI: -Markedly elevated high-sensitivity troponin greater than 27,000 in the setting of known CAD with A. fib with RVR and volume overload at presentation  -Remains on heparin drip as outlined below -Currently, not a good candidate for invasive ischemic evaluation given acute illness with severe comorbid conditions including acute renal failure -Consider addition of Plavix for medical management with possible discontinuation of aspirin prior to discharge if he is to be placed on [place] given his A. Fib if HGB allows -ASA -Crestor  2. PAF with RVR: -He remains in sinus rhythm, high risk for recurrent arrhythmia  -Blood pressures remain low -Continue p.o. amiodarone 400 mg twice daily for 3 more days, then decrease to 200 mg daily for 1 week, then 200 mg daily thereafter -Continue low-dose metoprolol and heparin drip -CHADS2VASc at least 4 (CHF, HTN, DM,  vascular disease) -Prior to discharge will need to evaluate his status for possible OAC given his comorbid conditions  2. HFrEF: -He remains volume overloaded, which may be contributing to his kidney disease -Volume management currently per nephrology given tenuous renal function -Continue Toprol-XL with plans to add Imdur/hydralazine discharge pending kidney function -BP precludes Coreg -Renal function precludes addition of ACEi/ARB/spironolactone/Entresto -Currently, not a good candidate for invasive ischemic evaluation given his new onset cardiomyopathy as outlined above -CHF education -Strict I/O  3. ARF: -Unfortunately renal function has continued to worsen -Bladder scan as above -May need dialysis per nephrology  4. Stage IV lung cancer with malignant pleural effusions and COPD with prior  tobacco use: -Oncology following -Complete tobacco cessation encouraged  5.  HLD: -Continue Crestor  6. Anemia: -HGB low, though stable -No indication for transfusion at this time -Continue close monitoring since he is on a heparin drip  For questions or updates, please contact [place] Please consult [email or web address] for contact info under Cardiology/STEMI.    Signed, Christell Faith, PA-C [place] Pager: [phone] 03/11/2019, 7:33 AM

## 2019-03-11 NOTE — Progress Notes (Signed)
HD Tx Completed    03/11/19 1803  Vital Signs  Temp 97.6 F (36.4 C)  Temp Source Oral  Pulse Rate 74  Pulse Rate Source Dinamap  Resp 18  BP (!) 107/53  BP Location Left Arm  BP Method Automatic  Patient Position (if appropriate) Lying  Oxygen Therapy  SpO2 100 %  O2 Device Nasal Cannula  O2 Flow Rate (L/min) 2 L/min  During Hemodialysis Assessment  KECN 18.2 KECN  Dialysis Fluid Bolus Normal Saline  Bolus Amount (mL) 250 mL  Intra-Hemodialysis Comments Tx completed;Tolerated well

## 2019-03-11 NOTE — Op Note (Signed)
  OPERATIVE NOTE   PROCEDURE: 1. Ultrasound guidance for vascular access Right femoral vein 2. Placement of a 30 cm triple lumen dialysis catheter right femoral vein  PRE-OPERATIVE DIAGNOSIS: 1. Acute renal failure  2. DKA  POST-OPERATIVE DIAGNOSIS: Same  SURGEON: Leotis Pain, MD  ASSISTANT(S): None  ANESTHESIA: local  ESTIMATED BLOOD LOSS: Minimal   FINDING(S): 1. None  SPECIMEN(S): None  INDICATIONS:  Patient is a 64 y.o.male who presents with acute kidney injury with continued decline of renal function.  He now needs initiation of dialysis with a BUN over 100 and a creatinine clearance down below 10.  He needs a temporary dialysis catheter and if he does not improve, a PermCath we placed later on this admission.  Risks and benefits were discussed, and informed consent was obtained..  DESCRIPTION: After obtaining full informed written consent, the patient was laid flat in the bed. The right groin was sterilely prepped and draped in a sterile surgical field was created. The right femoral vein was visualized with ultrasound and found to be widely patent. It was then accessed under direct guidance without difficulty with a Seldinger needle and a permanent image was recorded. A J-wire was then placed. After skin nick and dilatation, a 30 cm triple-lumen dialysis catheter was placed over the wire and the wire was removed. The lumens withdrew dark red nonpulsatile blood and flushed easily with sterile saline. The catheter was secured to the skin with 3 nylon sutures. Sterile dressing was placed.  COMPLICATIONS: None  CONDITION: Stable  Leotis Pain 03/11/2019 3:27 PM  This note was created with Dragon Medical transcription system. Any errors in dictation are purely unintentional.

## 2019-03-11 NOTE — Progress Notes (Signed)
Patient back from dialysis.  Wife has been updated.

## 2019-03-11 NOTE — NC FL2 (Signed)
[place] LEVEL OF CARE SCREENING TOOL     IDENTIFICATION  Patient Name: Craig Koch Birthdate: 07-25-1955 Sex: male Admission Date (Current Location): 03/03/2019  [place] and [place] Number:  Engineering geologist and Address:  [facility], [address], [place], [place] [address]      Provider Number: [id number]  Attending Physician Name and Address:  Louellen Molder, MD  Relative Name and Phone Number:       Current Level of Care: Hospital Recommended Level of Care: [place] Prior Approval Number:    Date Approved/Denied:   PASRR Number: [id number]  Discharge Plan: SNF    Current Diagnoses: Patient Active Problem List   Diagnosis Date Noted  . Acute systolic heart failure ([place])   . Pain of right upper extremity   . Microcytic anemia   . Protein-calorie malnutrition, severe 03/06/2019  . Acute alteration in mental status   . Palliative care encounter   . Atrial fibrillation with RVR ([place]) 03/03/2019  . DKA (diabetic ketoacidoses) ([place]) 03/03/2019  . Diabetes mellitus without complication ([place]) 94/85/4627  . Hypothermia 03/03/2019  . SOB (shortness of breath) 03/03/2019  . AKI (acute kidney injury) ([place]) 03/03/2019  . NSTEMI (non-ST elevated myocardial infarction) ([place]) 03/03/2019  . Hyponatremia 02/08/2019  . Uncontrolled type 2 diabetes mellitus with hyperglycemia ([place]) 02/08/2019  . Leg swelling 02/08/2019  . Paronychia, finger 12/19/2018  . Encounter for antineoplastic chemotherapy 12/08/2018  . Tobacco abuse 10/12/2018  . Malignant pleural effusion   . Administrative encounter 09/23/2018  . lung adenocarcinoma 09/08/2018  . Goals of care, counseling/discussion 09/08/2018  . Recurrent pleural effusion on left 09/02/2018  . Elevated troponin 08/24/2018  . Medicare annual wellness visit, subsequent 08/07/2018  . Lower urinary tract symptoms (LUTS) 08/07/2018  . Neck pain 08/07/2018  . CAD  (coronary artery disease), native coronary artery 09/23/2017  . Smoker 09/23/2017  . Chronic joint pain 10/18/2016  . Healthcare maintenance 07/17/2016  . Advance care planning 07/17/2016  . Fungal rash of torso 05/06/2016  . Trigger finger 05/06/2016  . History of alcohol use 11/17/2015  . Skin lesion 05/19/2015  . Anxiety state 02/19/2015  . Hand weakness 05/23/2011  . Diabetes mellitus with complication ([place]) 03/50/0938  . HLD (hyperlipidemia) 03/05/2010  . Essential hypertension 03/05/2010  . Coronary atherosclerosis 03/05/2010    Orientation RESPIRATION BLADDER Height & Weight     Self, Time, Situation, Place  O2(Nasal Cannula 2L) Continent Weight: 202 lb 13.2 oz (92 kg) Height:  6' (182.9 cm)  BEHAVIORAL SYMPTOMS/MOOD NEUROLOGICAL BOWEL NUTRITION STATUS      Continent Diet(DYS 1 diet, thin liquids)  AMBULATORY STATUS COMMUNICATION OF NEEDS Skin   Extensive Assist Verbally Normal                       Personal Care Assistance Level of Assistance  Bathing, Dressing, Feeding Bathing Assistance: Maximum assistance Feeding assistance: Limited assistance Dressing Assistance: Maximum assistance     Functional Limitations Info  Sight, Speech, Hearing Sight Info: Adequate Hearing Info: Adequate Speech Info: Adequate    SPECIAL CARE FACTORS FREQUENCY  PT (By licensed PT), OT (By licensed OT)     PT Frequency: 5x OT Frequency: 5x            Contractures Contractures Info: Not present    Additional Factors Info  Code Status, Allergies Code Status Info: DNR Allergies Info: Lipitor (Atorvastatin Calcium)  Current Medications (03/11/2019):  This is the current hospital active medication list Current Facility-Administered Medications  Medication Dose Route Frequency Provider Last Rate Last Admin  . acetaminophen (TYLENOL) tablet 650 mg  650 mg Oral Q6H PRN Ivor Costa, MD      . albuterol (PROVENTIL) (2.5 MG/3ML) 0.083% nebulizer solution 3 mL  3 mL  Inhalation Q4H PRN Ivor Costa, MD      . amiodarone (PACERONE) tablet 400 mg  400 mg Oral BID Christell Faith M, PA-C   400 mg at 03/10/19 2029  . aspirin EC tablet 81 mg  81 mg Oral Daily Kate Sable, MD   81 mg at 03/11/19 0837  . chlorhexidine (PERIDEX) 0.12 % solution 15 mL  15 mL Mouth Rinse BID Darel Hong D, NP   15 mL at 03/11/19 0838  . Chlorhexidine Gluconate Cloth 2 % PADS 6 each  6 each Topical Q0600 Lateef, Munsoor, MD      . dextromethorphan-guaiFENesin (MUCINEX DM) 30-600 MG per 12 hr tablet 1 tablet  1 tablet Oral BID Ivor Costa, MD   1 tablet at 03/11/19 [phone]  . dextrose 50 % solution 0-50 mL  0-50 mL Intravenous PRN Ivor Costa, MD   25 mL at 03/08/19 0355  . dronabinol (MARINOL) capsule 5 mg  5 mg Oral BID AC Ivor Costa, MD   5 mg at 03/11/19 0837  . DULoxetine (CYMBALTA) DR capsule 30 mg  30 mg Oral Daily Ivor Costa, MD   30 mg at 03/11/19 0837  . heparin ADULT infusion 100 units/mL (25000 units/213mL sodium chloride 0.45%)  1,700 Units/hr Intravenous Continuous Hart Robinsons A, RPH 17 mL/hr at 03/11/19 0625 1,700 Units/hr at 03/11/19 0625  . insulin aspart (novoLOG) injection 0-15 Units  0-15 Units Subcutaneous Q4H Ralene Muskrat B, MD   2 Units at 03/11/19 [phone]  . ipratropium-albuterol (DUONEB) 0.5-2.5 (3) MG/3ML nebulizer solution 3 mL  3 mL Nebulization TID Dhungel, Nishant, MD   3 mL at 03/11/19 0818  . MEDLINE mouth rinse  15 mL Mouth Rinse q12n4p Darel Hong D, NP   15 mL at 03/10/19 1748  . menthol-cetylpyridinium (CEPACOL) lozenge 3 mg  1 lozenge Oral PRN Earlie Server, MD      . metoprolol tartrate (LOPRESSOR) tablet 12.5 mg  12.5 mg Oral BID Christell Faith M, PA-C   12.5 mg at 03/10/19 2029  . multivitamin with minerals tablet 1 tablet  1 tablet Oral Daily Ivor Costa, MD   1 tablet at 03/11/19 0837  . nicotine (NICODERM CQ - dosed in mg/24 hours) patch 21 mg  21 mg Transdermal Daily Ivor Costa, MD   21 mg at 03/11/19 0839  . ondansetron (ZOFRAN) injection 4 mg  4 mg  Intravenous Q8H PRN Ivor Costa, MD      . pneumococcal 23 valent vaccine (PNEUMOVAX-23) injection 0.5 mL  0.5 mL Intramuscular Prior to discharge Priscella Mann, Sudheer B, MD      . polyethylene glycol (MIRALAX / GLYCOLAX) packet 17 g  17 g Oral Daily Lang Snow, NP   17 g at 03/09/19 1014  . protein supplement (ENSURE MAX) liquid  11 oz Oral BID BM Dhungel, Nishant, MD   11 oz at 03/11/19 0839  . rosuvastatin (CRESTOR) tablet 10 mg  10 mg Oral Daily Ivor Costa, MD   10 mg at 03/11/19 0837  . sodium bicarbonate 150 mEq in dextrose 5% 1000 mL infusion  150 mEq Intravenous Continuous Lateef, Munsoor, MD 75 mL/hr at 03/11/19 0554 150  mEq at 03/11/19 0554  . sodium bicarbonate tablet 1,300 mg  1,300 mg Oral BID Murlean Iba, MD   1,300 mg at 03/11/19 0837  . sodium chloride (OCEAN) 0.65 % nasal spray 1 spray  1 spray Each Nare PRN Lang Snow, NP   1 spray at 03/11/19 0503  . thiamine (B-1) 159 mg, folic acid 1 mg in sodium chloride 0.9 % 50 mL injection   Intravenous Q24H Dallie Piles, RPH 50 mL/hr at 03/10/19 1733 New Bag at 03/10/19 1733     Discharge Medications: Please see discharge summary for a list of discharge medications.  Relevant Imaging Results:  Relevant Lab Results:   Additional Information ELM:[id number]  Gerrianne Scale Sojourner Behringer, LCSW

## 2019-03-11 NOTE — Progress Notes (Signed)
[place]  Telephone:([phone] [phone]   Name: Craig Koch Date: 03/11/2019 MRN: [id number]  DOB: 1955/04/10  Patient Care Team: Tonia Ghent, MD as PCP - General (Family Medicine) Thelma Comp, [place] (Optometry) Telford Nab, RN as Registered Nurse    REASON FOR CONSULTATION: Craig Koch is a 64 y.o. male with multiple medical problems including stage IV adenocarcinoma of the lung with history of left sided malignant pleural effusion requiring Pleurx (later removed). Patient has opted not to pursue chemotherapy and has instead been treated on immunotherapy. Patient has had persistent shortness of breath. CTA of the chest on 11/02/2018 revealed marked increase in tumor encasing the entire left lung with new diffuse increased interstitial thickening concerning for disease progression and lymphangitic spread of disease.   Repeat CT of the chest and abdomen on 01/22/2019 revealed significant interval decrease of tumor burden on the lung.  Patient was admitted to the hospital on 03/03/2019 with shortness of breath and weakness.  He was found to have non-STEMI and DKA.  Patient was referred to palliative care to help address goals and manage ongoing symptoms.   CODE STATUS: DNR  PAST MEDICAL HISTORY: Past Medical History:  Diagnosis Date  . Anxiety   . Arthritis   . Coronary artery disease   . Diabetes mellitus   . Dyslipidemia   . Hx of CABG   . Hyperlipidemia   . Hypertension   . Malignant neoplasm of unspecified part of unspecified bronchus or lung ([place]) 08/2018   Immunotherapy  . Pleural effusion     PAST SURGICAL HISTORY:  Past Surgical History:  Procedure Laterality Date  . CATARACT EXTRACTION Left 09/2015  . CHEST TUBE INSERTION Left 10/01/2018   Procedure: INSERTION PLEURAL DRAINAGE CATHETER;  Surgeon: [name], MD;  Location: [facility] ORS;  Service: Thoracic;  Laterality: Left;  . CORONARY  ARTERY BYPASS GRAFT  2004   (CABG with LIMA to the  LAD, SVG to OM2/OM3, SVG  to diag  . Left ankle surgery     repair of fracture  . Right lower leg surgery     rod    HEMATOLOGY/ONCOLOGY HISTORY:  Oncology History  lung adenocarcinoma  09/08/2018 Initial Diagnosis   lung adenocarcinoma   10/19/2018 -  Chemotherapy   The patient had pembrolizumab (KEYTRUDA) 200 mg in sodium chloride 0.9 % 50 mL chemo infusion, 200 mg, Intravenous, Once, 2 of 8 cycles Administration: 200 mg (10/19/2018), 200 mg (11/09/2018)  for chemotherapy treatment.      ALLERGIES:  is allergic to lipitor [atorvastatin calcium].  MEDICATIONS:  Current Facility-Administered Medications  Medication Dose Route Frequency Provider Last Rate Last Admin  . acetaminophen (TYLENOL) tablet 650 mg  650 mg Oral Q6H PRN Ivor Costa, MD      . albuterol (PROVENTIL) (2.5 MG/3ML) 0.083% nebulizer solution 3 mL  3 mL Inhalation Q4H PRN Ivor Costa, MD      . amiodarone (PACERONE) tablet 400 mg  400 mg Oral BID Christell Faith M, PA-C   400 mg at 03/10/19 2029  . aspirin EC tablet 81 mg  81 mg Oral Daily Kate Sable, MD   81 mg at 03/11/19 0837  . chlorhexidine (PERIDEX) 0.12 % solution 15 mL  15 mL Mouth Rinse BID Darel Hong D, NP   15 mL at 03/11/19 0838  . Chlorhexidine Gluconate Cloth 2 % PADS 6 each  6 each Topical Q0600 Holley Raring, [place], MD      .  dextromethorphan-guaiFENesin (MUCINEX DM) 30-600 MG per 12 hr tablet 1 tablet  1 tablet Oral BID Ivor Costa, MD   1 tablet at 03/11/19 [phone]  . dextrose 50 % solution 0-50 mL  0-50 mL Intravenous PRN Ivor Costa, MD   25 mL at 03/08/19 0355  . dronabinol (MARINOL) capsule 5 mg  5 mg Oral BID AC Ivor Costa, MD   5 mg at 03/11/19 0837  . DULoxetine (CYMBALTA) DR capsule 30 mg  30 mg Oral Daily Ivor Costa, MD   30 mg at 03/11/19 0837  . ferrous sulfate tablet 325 mg  325 mg Oral BID WC Earlie Server, MD      . heparin ADULT infusion 100 units/mL (25000 units/276mL sodium chloride 0.45%)   1,650 Units/hr Intravenous Continuous Benita Gutter, RPH 16.5 mL/hr at 03/11/19 1126 1,650 Units/hr at 03/11/19 1126  . insulin aspart (novoLOG) injection 0-15 Units  0-15 Units Subcutaneous Q4H Ralene Muskrat B, MD   2 Units at 03/11/19 1152  . ipratropium-albuterol (DUONEB) 0.5-2.5 (3) MG/3ML nebulizer solution 3 mL  3 mL Nebulization TID Dhungel, Nishant, MD   3 mL at 03/11/19 0818  . MEDLINE mouth rinse  15 mL Mouth Rinse q12n4p Darel Hong D, NP   15 mL at 03/11/19 1145  . menthol-cetylpyridinium (CEPACOL) lozenge 3 mg  1 lozenge Oral PRN Earlie Server, MD      . metoprolol tartrate (LOPRESSOR) tablet 12.5 mg  12.5 mg Oral BID Christell Faith M, PA-C   12.5 mg at 03/10/19 2029  . multivitamin with minerals tablet 1 tablet  1 tablet Oral Daily Ivor Costa, MD   1 tablet at 03/11/19 0837  . nicotine (NICODERM CQ - dosed in mg/24 hours) patch 21 mg  21 mg Transdermal Daily Ivor Costa, MD   21 mg at 03/11/19 0839  . ondansetron (ZOFRAN) injection 4 mg  4 mg Intravenous Q8H PRN Ivor Costa, MD      . pneumococcal 23 valent vaccine (PNEUMOVAX-23) injection 0.5 mL  0.5 mL Intramuscular Prior to discharge Priscella Mann, Sudheer B, MD      . polyethylene glycol (MIRALAX / GLYCOLAX) packet 17 g  17 g Oral Daily Lang Snow, NP   17 g at 03/09/19 1014  . protein supplement (ENSURE MAX) liquid  11 oz Oral BID BM Dhungel, Nishant, MD   11 oz at 03/11/19 0839  . rosuvastatin (CRESTOR) tablet 10 mg  10 mg Oral Daily Ivor Costa, MD   10 mg at 03/11/19 0837  . sodium bicarbonate tablet 1,300 mg  1,300 mg Oral BID Murlean Iba, MD   1,300 mg at 03/11/19 0837  . sodium chloride (OCEAN) 0.65 % nasal spray 1 spray  1 spray Each Nare PRN Lang Snow, NP   1 spray at 03/11/19 0503  . thiamine (B-1) 960 mg, folic acid 1 mg in sodium chloride 0.9 % 50 mL injection   Intravenous Q24H Dallie Piles, [facility]   Stopped at 03/11/19 1145    VITAL SIGNS: BP (!) 97/49 (BP Location: Right Arm)   Pulse 68    Temp (!) 97.5 F (36.4 C) (Oral)   Resp 18   Ht 6' (1.829 m)   Wt 202 lb 13.2 oz (92 kg)   SpO2 100%   BMI 27.51 kg/m  Filed Weights   03/08/19 1148 03/09/19 0437 03/11/19 0455  Weight: 190 lb 12.8 oz (86.5 kg) 203 lb 8 oz (92.3 kg) 202 lb 13.2 oz (92 kg)    Estimated  body mass index is 27.51 kg/m as calculated from the following:   Height as of this encounter: 6' (1.829 m).   Weight as of this encounter: 202 lb 13.2 oz (92 kg).  LABS: CBC:    Component Value Date/Time   WBC 7.1 03/11/2019 0643   HGB 8.0 (L) 03/11/2019 0643   HCT 22.7 (L) 03/11/2019 0643   PLT 277 03/11/2019 0643   MCV 77.2 (L) 03/11/2019 0643   NEUTROABS 7.3 03/03/2019 1330   LYMPHSABS 0.8 03/03/2019 1330   MONOABS 0.7 03/03/2019 1330   EOSABS 0.0 03/03/2019 1330   BASOSABS 0.1 03/03/2019 1330   Comprehensive Metabolic Panel:    Component Value Date/Time   NA 130 (L) 03/11/2019 0643   K 3.7 03/11/2019 0643   CL 96 (L) 03/11/2019 0643   CO2 25 03/11/2019 0643   BUN 109 (H) 03/11/2019 0643   CREATININE 6.41 (H) 03/11/2019 0643   GLUCOSE 146 (H) 03/11/2019 0643   CALCIUM 7.3 (L) 03/11/2019 0643   AST 136 (H) 03/03/2019 1750   ALT 25 03/03/2019 1750   ALKPHOS 104 03/03/2019 1750   BILITOT 2.4 (H) 03/03/2019 1750   PROT 7.5 03/03/2019 1750   ALBUMIN 1.9 (L) 03/10/2019 0319    RADIOGRAPHIC STUDIES: CT HEAD WO CONTRAST  Result Date: 03/05/2019 CLINICAL DATA:  Metastatic disease evaluation.  Lung cancer. EXAM: CT HEAD WITHOUT CONTRAST TECHNIQUE: Contiguous axial images were obtained from the base of the skull through the vertex without intravenous contrast. COMPARISON:  MRI head 09/20/2018 FINDINGS: Brain: Mild atrophy. Mild hypodensity in the white matter unchanged from the prior MRI. No acute infarct, hemorrhage, mass. No edema or midline shift. Vascular: Negative for hyperdense vessel Skull: Negative skull. Sinuses/Orbits: Mucosal edema and bony thickening right maxillary sinus. Chronic nasal bone  fracture. Left cataract extraction. Other: None IMPRESSION: No acute abnormality and negative for metastatic disease on unenhanced CT. Atrophy and mild chronic microvascular ischemia. Electronically Signed   By: Franchot Gallo M.D.   On: 03/05/2019 15:28   US RENAL  Result Date: 03/10/2019 CLINICAL DATA:  64 year old presenting with acute renal failure. Evaluate for obstruction as a possible cause. EXAM: RENAL / URINARY TRACT ULTRASOUND COMPLETE COMPARISON:  Urinary tract ultrasound 03/04/2019. CT abdomen and pelvis 01/22/2019. FINDINGS: Right Kidney: Renal measurements: Approximately 12.6 x 6.4 x 6.2 cm = volume: 264 mL. Mildly echogenic parenchyma. No hydronephrosis. Well-preserved cortex. No shadowing calculi. No focal parenchymal abnormality. Left Kidney: Renal measurements: Approximately 12.5 x 6.3 x 6.9 cm = volume: 283 mL. Mildly echogenic parenchyma. No hydronephrosis. Well-preserved cortex. No shadowing calculi. No focal parenchymal abnormality. Bladder: Normal for degree of bladder distention. Other: None. IMPRESSION: 1. Mildly echogenic renal parenchyma indicative of medical renal disease. 2. Otherwise normal examination. Specifically, no evidence of urinary tract obstruction to account for the acute renal insufficiency. Electronically Signed   By: Evangeline Dakin M.D.   On: 03/10/2019 15:02   US RENAL  Result Date: 03/04/2019 CLINICAL DATA:  Acute kidney injury EXAM: RENAL / URINARY TRACT ULTRASOUND COMPLETE COMPARISON:  CT 01/22/2019 FINDINGS: Right Kidney: Renal measurements: 11.4 x 6 x 6 cm = volume: 215 mL . Echogenicity within normal limits. No mass or hydronephrosis visualized. Left Kidney: Renal measurements: 11.8 x 7.3 x 6.5 cm = volume: 291.8 mL. Echogenicity within normal limits. No mass or hydronephrosis visualized. Bladder: Appears normal for degree of bladder distention. Other: Incidental note made of gallstones and small effusions. Heterogenous prostate. IMPRESSION: Normal ultrasound  appearance of the kidneys Electronically Signed  By: Donavan Foil M.D.   On: 03/04/2019 15:37   DG Chest Portable 1 View  Result Date: 03/03/2019 CLINICAL DATA:  Shortness of breath, since Monday worsening this morning around 1 a.m. EXAM: PORTABLE CHEST 1 VIEW COMPARISON:  12/19/2018 FINDINGS: Cardiomediastinal contours are stable following median sternotomy with persistent left-sided pleural effusion and pleural-parenchymal scarring. No new areas of consolidation or evidence of right-sided pleural effusion. No signs of acute bone process. IMPRESSION: Persistent left-sided pleural effusion and pleural-parenchymal scarring. Electronically Signed   By: Zetta Bills M.D.   On: 03/03/2019 13:59   ECHOCARDIOGRAM COMPLETE  Result Date: 03/04/2019    ECHOCARDIOGRAM REPORT   Patient Name:   CRECENCIO KWIATEK Date of Exam: 03/04/2019 Medical Rec #:  [id number]       Height:       72.0 in Accession #:    [id number]      Weight:       185.0 lb Date of Birth:  04/19/1955       BSA:          2.061 m Patient Age:    71 years        BP:           125/72 mmHg Patient Gender: M               HR:           119 bpm. Exam Location:  [facility] Procedure: 2D Echo, Color Doppler and Cardiac Doppler Indications:     I21.4 NSTEMI  History:         Patient has prior history of Echocardiogram examinations, most                  recent 08/24/2018. CAD, Prior CABG; Risk Factors:Hypertension,                  Dyslipidemia and Diabetes. Lung Cancer.  Sonographer:     Charmayne Sheer RDCS (AE) Referring Phys:  [place] Phys: Kathlyn Sacramento MD IMPRESSIONS  1. Left ventricular ejection fraction, by estimation, is 30 to 35%. The left ventricle has moderately decreased function. The left ventricle demonstrates global hypokinesis. Left ventricular diastolic parameters are indeterminate.  2. Right ventricular systolic function is normal. The right ventricular size is normal. Tricuspid regurgitation signal is inadequate for assessing  PA pressure.  3. Left atrial size was mild to moderately dilated.  4. The mitral valve is normal in structure and function. Moderate mitral valve regurgitation. No evidence of mitral stenosis.  5. The aortic valve is normal in structure and function. Aortic valve regurgitation is trivial. Mild to moderate aortic valve sclerosis/calcification is present, without any evidence of aortic stenosis.  6. The inferior vena cava is normal in size with greater than 50% respiratory variability, suggesting right atrial pressure of 3 mmHg. FINDINGS  Left Ventricle: Left ventricular ejection fraction, by estimation, is 30 to 35%. The left ventricle has moderately decreased function. The left ventricle demonstrates global hypokinesis. The left ventricular internal cavity size was normal in size. There is no left ventricular hypertrophy. Left ventricular diastolic parameters are indeterminate. Right Ventricle: The right ventricular size is normal. No increase in right ventricular wall thickness. Right ventricular systolic function is normal. Tricuspid regurgitation signal is inadequate for assessing PA pressure. Left Atrium: Left atrial size was mild to moderately dilated. Right Atrium: Right atrial size was normal in size. Pericardium: There is no evidence of pericardial effusion. Mitral Valve: The mitral valve is  normal in structure and function. Normal mobility of the mitral valve leaflets. Moderate mitral valve regurgitation. No evidence of mitral valve stenosis. MV peak gradient, 7.5 mmHg. The mean mitral valve gradient is 4.0  mmHg. Tricuspid Valve: The tricuspid valve is normal in structure. Tricuspid valve regurgitation is not demonstrated. No evidence of tricuspid stenosis. Aortic Valve: The aortic valve is normal in structure and function. Aortic valve regurgitation is trivial. Aortic regurgitation PHT measures 281 msec. Mild to moderate aortic valve sclerosis/calcification is present, without any evidence of aortic  stenosis. Aortic valve mean gradient measures 5.0 mmHg. Aortic valve peak gradient measures 8.5 mmHg. Aortic valve area, by VTI measures 2.53 cm. Pulmonic Valve: The pulmonic valve was normal in structure. Pulmonic valve regurgitation is not visualized. No evidence of pulmonic stenosis. Aorta: The aortic root is normal in size and structure. Venous: The inferior vena cava is normal in size with greater than 50% respiratory variability, suggesting right atrial pressure of 3 mmHg. IAS/Shunts: No atrial level shunt detected by color flow Doppler.  LEFT VENTRICLE PLAX 2D LVIDd:         5.21 cm  Diastology LVIDs:         4.21 cm  LV e' lateral:   7.83 cm/s LV PW:         0.84 cm  LV E/e' lateral: 13.4 LV IVS:        0.89 cm  LV e' medial:    7.94 cm/s LVOT diam:     2.40 cm  LV E/e' medial:  13.2 LV SV:         54 LV SV Index:   26 LVOT Area:     4.52 cm  RIGHT VENTRICLE RV Basal diam:  3.08 cm LEFT ATRIUM             Index       RIGHT ATRIUM           Index LA diam:        4.70 cm 2.28 cm/m  RA Area:     14.10 cm LA Vol (A2C):   62.0 ml 30.08 ml/m RA Volume:   35.20 ml  17.08 ml/m LA Vol (A4C):   68.6 ml 33.29 ml/m LA Biplane Vol: 65.3 ml 31.68 ml/m  AORTIC VALVE                    PULMONIC VALVE AV Area (Vmax):    2.95 cm     PV Vmax:       1.10 m/s AV Area (Vmean):   2.86 cm     PV Vmean:      68.400 cm/s AV Area (VTI):     2.53 cm     PV VTI:        0.146 m AV Vmax:           146.00 cm/s  PV Peak grad:  4.8 mmHg AV Vmean:          103.000 cm/s PV Mean grad:  2.0 mmHg AV VTI:            0.213 m AV Peak Grad:      8.5 mmHg AV Mean Grad:      5.0 mmHg LVOT Vmax:         95.30 cm/s LVOT Vmean:        65.200 cm/s LVOT VTI:          0.119 m LVOT/AV VTI ratio: 0.56 AI PHT:  281 msec  AORTA Ao Root diam: 3.00 cm MITRAL VALVE MV Area (PHT): 3.92 cm     SHUNTS MV Peak grad:  7.5 mmHg     Systemic VTI:  0.12 m MV Mean grad:  4.0 mmHg     Systemic Diam: 2.40 cm MV Vmax:       1.37 m/s MV Vmean:      92.7  cm/s MV Decel Time: 194 msec MV E velocity: 104.80 cm/s Kathlyn Sacramento MD Electronically signed by Kathlyn Sacramento MD Signature Date/Time: 03/04/2019/2:46:43 PM    Final    [place] RT UPPER EXTREM LTD SOFT TISSUE NON VASCULAR  Result Date: 03/08/2019 CLINICAL DATA:  Swelling and bruising of the right upper extremity. Possible hematoma. EXAM: ULTRASOUND right UPPER EXTREMITY LIMITED TECHNIQUE: Ultrasound examination of the upper extremity soft tissues was performed in the area of clinical concern. COMPARISON:  None. FINDINGS: Diffuse subcutaneous soft tissue swelling/edema and streaky fluid. No discrete fluid collection or hematoma. The underlying musculature is grossly normal. IMPRESSION: Diffuse subcutaneous soft tissue swelling/edema/streaky fluid but no discrete abscess or hematoma. Electronically Signed   By: Marijo Sanes M.D.   On: 03/08/2019 19:13    PERFORMANCE STATUS (ECOG) : 3 - Symptomatic, >50% confined to bed  Review of Systems Unless otherwise noted, a complete review of systems is negative.  Physical Exam General: NAD Pulmonary: unlabored Abdomen: soft, nontender, + bowel sounds GU: no suprapubic tenderness Extremities: no edema, no joint deformities Skin: no rashes Neurological: Weakness  IMPRESSION: Weekend notes reviewed.  Patient's mentation is back to baseline.  Unfortunately, serum creatinine has continued to climb.  Patient says that he spoke with nephrology who is likely to pursue dialysis.  Patient had some questions about hemodialysis and says that he remains undecided but wants to speak with his wife prior to making any decisions.  PLAN: -Continue current scope of treatment -DNR/DNI -Will follow.     Time Total: 15 minutes  Visit consisted of counseling and education dealing with the complex and emotionally intense issues of symptom management and palliative care in the setting of serious and potentially life-threatening illness.Greater than 50%  of this time was  spent counseling and coordinating care related to the above assessment and plan.  Signed by: Altha Harm, PhD, NP-C

## 2019-03-11 NOTE — Progress Notes (Signed)
Occupational Therapy Treatment Patient Details Name: Craig Koch MRN: [id number] DOB: 06-May-1955 Today's Date: 03/11/2019    History of present illness 64 y.o. male with medical history significant of hypertension, hyperlipidemia, diabetes mellitus, depression, anxiety, lung cancer, CAD, CABG, tobacco abuse, alcohol abuse, left pleural effusion, who presents with shortness of breath and generalized weakness   OT comments  Pt seen for OT tx this date to f/u re: safety and indep with self care ADLs/ADL mobility. Session limited this date as pt is fatigued and awaiting temporary dialysis catheter placement and dialysis treatment this date. Pt is noted to have slid down in bed when OT presents for treatment and is agreeable to participating in repositioning. Pt requires MIN/MOD A +2 for propulsion towards [facility] with OT providing MNI verbal cues for pt foot placement to contribute to repositioning. Pt noted to employ LEs and moderately contribute to repositioning efforts. In addition, OT notes some swelling to R UE and provides repositioning through elevation on pillows to encourage decreased edema and explains rationale to pt and spouse. In addition, OT provides education re: importance of OOB activity and notifies that activity might be limited after procedures, however, pt still states he does not feel up to any more activity this date and session concluded at this time. Will continue to follow pt. Anticipate SNF is still most prudent discharge destination at this time.   Follow Up Recommendations  SNF    Equipment Recommendations  Other (comment)(defer to next level of care)    Recommendations for Other Services      Precautions / Restrictions Precautions Precautions: Fall Restrictions Weight Bearing Restrictions: No       Mobility Bed Mobility Overal bed mobility: Needs Assistance             General bed mobility comments: Pt requires MIN/MOD A +2 with MIN verbal cues for foot  placement for propulsion toward HOB to optimize positioning. Pt does make effort to contribute and can be felt emplying LEs to adjust bed level positioning. Pt not agreeable to sup<>sit transition this date as he is fatigued and with several procedures today and in addition, pt's BP noted to be somewhat soft this date.  Transfers                 General transfer comment: deferred    Balance                                           ADL either performed or assessed with clinical judgement   ADL                                               Vision Patient Visual Report: No change from baseline     Perception     Praxis      Cognition Arousal/Alertness: Awake/alert Behavior During Therapy: Flat affect Overall Cognitive Status: Within Functional Limits for tasks assessed                                          Exercises Other Exercises Other Exercises: OT facilitates repositioning of pt's R UE (elevates) to assist with edema and  provides education to pt and spouse re: rationale for this positioning. Other Exercises: OT facilitates pt and spouse education re: importance of keeping feet off hard surface as pt is noted to have slid down in bed with feet touching footboard when OT presents. OT engages pt in repositioning/propulsion towards [facility] for which pt is moderately able to contribute with MIN verbal cues. Other Exercises: OT facilitates education with pt and spouse re: importance of OOB activity including prevention of secondary infection such as PNA. Pt and spouse verbalize understanding.   Shoulder Instructions       General Comments      Pertinent Vitals/ Pain       Pain Assessment: Faces Faces Pain Scale: Hurts a little bit Pain Location: R forearm/AC still swollen/bruised Pain Descriptors / Indicators: Sore Pain Intervention(s): Monitored during session;Repositioned(OT elevated R UE on pillows to help with  edema and provided rationale to pt and spouse who is present througout)  Home Living                                          Prior Functioning/Environment              Frequency  Min 2X/week        Progress Toward Goals  OT Goals(current goals can now be found in the care plan section)  Progress towards OT goals: OT to reassess next treatment  Acute Rehab OT Goals Patient Stated Goal: get stronger OT Goal Formulation: With patient/family Time For Goal Achievement: 03/22/19 Potential to Achieve Goals: Good  Plan Discharge plan remains appropriate    Co-evaluation                 AM-PAC OT "6 Clicks" Daily Activity     Outcome Measure   Help from another person eating meals?: A Little Help from another person taking care of personal grooming?: A Little Help from another person toileting, which includes using toliet, bedpan, or urinal?: A Lot Help from another person bathing (including washing, rinsing, drying)?: A Lot Help from another person to put on and taking off regular upper body clothing?: A Lot Help from another person to put on and taking off regular lower body clothing?: A Lot 6 Click Score: 14    End of Session    OT Visit Diagnosis: Unsteadiness on feet (R26.81);Muscle weakness (generalized) (M62.81)   Activity Tolerance Patient limited by fatigue   Patient Left in bed;with call bell/phone within reach;with bed alarm set;with family/visitor present;with nursing/sitter in room(RN presenting to have pt sign paperwork prior to procedure-temporary dialysis catheter placement)   Nurse Communication          Time: 3559-7416 OT Time Calculation (min): 12 min  Charges: OT General Charges $OT Visit: 1 Visit OT Treatments $Therapeutic Activity: 8-22 mins  Gerrianne Scale, [place], OTR/L ascom [phone] 03/11/19, 2:08 PM

## 2019-03-11 NOTE — Progress Notes (Signed)
Hematology/Oncology Progress Note [facility] [phone] [phone]  Patient Care Team: Tonia Ghent, MD as PCP - General (Family Medicine) Thelma Comp, Melvin (Optometry) Telford Nab, RN as Registered Nurse   Name of the patient: Craig Koch  [id number]  05-25-1955  Date of visit: 03/11/19   INTERVAL HISTORY-  Wife is at bedside today.  Patient has weaned off oxygen and yesterday evening, he was started on oxygen again. Some shortness of breath. Right upper extremity pain    Review of systems- Review of Systems  Constitutional: Positive for fatigue. Negative for appetite change, chills, fever and unexpected weight change.  HENT:   Negative for hearing loss and voice change.   Eyes: Negative for eye problems and icterus.  Respiratory: Positive for shortness of breath. Negative for chest tightness and cough.   Cardiovascular: Negative for chest pain and leg swelling.  Gastrointestinal: Negative for abdominal distention and abdominal pain.  Endocrine: Negative for hot flashes.  Genitourinary: Negative for difficulty urinating, dysuria and frequency.   Musculoskeletal: Negative for arthralgias.       Right arm pain  Skin: Negative for itching and rash.  Neurological: Negative for light-headedness and numbness.  Hematological: Negative for adenopathy. Does not bruise/bleed easily.  Psychiatric/Behavioral: Negative for confusion.    Allergies  Allergen Reactions  . Lipitor [Atorvastatin Calcium] Other (See Comments)    Aches.  Tolerated crestor.     Patient Active Problem List   Diagnosis Date Noted  . Acute systolic heart failure ([place])   . Pain of right upper extremity   . Microcytic anemia   . Protein-calorie malnutrition, severe 03/06/2019  . Acute alteration in mental status   . Palliative care encounter   . Atrial fibrillation with RVR ([place]) 03/03/2019  . DKA (diabetic ketoacidoses) ([place]) 03/03/2019  .  Diabetes mellitus without complication ([place]) 09/32/3557  . Hypothermia 03/03/2019  . SOB (shortness of breath) 03/03/2019  . AKI (acute kidney injury) ([place]) 03/03/2019  . NSTEMI (non-ST elevated myocardial infarction) ([place]) 03/03/2019  . Hyponatremia 02/08/2019  . Uncontrolled type 2 diabetes mellitus with hyperglycemia ([place]) 02/08/2019  . Leg swelling 02/08/2019  . Paronychia, finger 12/19/2018  . Encounter for antineoplastic chemotherapy 12/08/2018  . Tobacco abuse 10/12/2018  . Malignant pleural effusion   . Administrative encounter 09/23/2018  . lung adenocarcinoma 09/08/2018  . Goals of care, counseling/discussion 09/08/2018  . Recurrent pleural effusion on left 09/02/2018  . Elevated troponin 08/24/2018  . Medicare annual wellness visit, subsequent 08/07/2018  . Lower urinary tract symptoms (LUTS) 08/07/2018  . Neck pain 08/07/2018  . CAD (coronary artery disease), native coronary artery 09/23/2017  . Smoker 09/23/2017  . Chronic joint pain 10/18/2016  . Healthcare maintenance 07/17/2016  . Advance care planning 07/17/2016  . Fungal rash of torso 05/06/2016  . Trigger finger 05/06/2016  . History of alcohol use 11/17/2015  . Skin lesion 05/19/2015  . Anxiety state 02/19/2015  . Hand weakness 05/23/2011  . Diabetes mellitus with complication ([place]) 32/20/2542  . HLD (hyperlipidemia) 03/05/2010  . Essential hypertension 03/05/2010  . Coronary atherosclerosis 03/05/2010     Past Medical History:  Diagnosis Date  . Anxiety   . Arthritis   . Coronary artery disease   . Diabetes mellitus   . Dyslipidemia   . Hx of CABG   . Hyperlipidemia   . Hypertension   . Malignant neoplasm of unspecified part of unspecified bronchus or lung ([place]) 08/2018   Immunotherapy  . Pleural effusion  Past Surgical History:  Procedure Laterality Date  . CATARACT EXTRACTION Left 09/2015  . CHEST TUBE INSERTION Left 10/01/2018   Procedure: INSERTION PLEURAL DRAINAGE CATHETER;   Surgeon: [name], MD;  Location: [facility] ORS;  Service: Thoracic;  Laterality: Left;  . CORONARY ARTERY BYPASS GRAFT  2004   (CABG with LIMA to the  LAD, SVG to OM2/OM3, SVG  to diag  . Left ankle surgery     repair of fracture  . Right lower leg surgery     rod    Social History   Socioeconomic History  . Marital status: Married    Spouse name: Not on file  . Number of children: Not on file  . Years of education: Not on file  . Highest education level: Not on file  Occupational History  . Not on file  Tobacco Use  . Smoking status: Current Every Day Smoker    Packs/day: 0.20    Years: 45.00    Pack years: 9.00    Types: Cigarettes  . Smokeless tobacco: Former Systems developer    Types: Snuff  Substance and Sexual Activity  . Alcohol use: Yes    Alcohol/week: 6.0 standard drinks    Types: 6 Cans of beer per week    Comment: occ, average 6 pack in a week  . Drug use: No  . Sexual activity: Not Currently  Other Topics Concern  . Not on file  Social History Narrative   On disability 2009 after prev injuries and CAD.     Married 1976   2 kids, 4 grandkids.    Social Determinants of Health   Financial Resource Strain:   . Difficulty of Paying Living Expenses: Not on file  Food Insecurity:   . Worried About Charity fundraiser in the Last Year: Not on file  . Ran Out of Food in the Last Year: Not on file  Transportation Needs:   . Lack of Transportation (Medical): Not on file  . Lack of Transportation (Non-Medical): Not on file  Physical Activity:   . Days of Exercise per Week: Not on file  . Minutes of Exercise per Session: Not on file  Stress:   . Feeling of Stress : Not on file  Social Connections:   . Frequency of Communication with Friends and Family: Not on file  . Frequency of Social Gatherings with Friends and Family: Not on file  . Attends Religious Services: Not on file  . Active Member of Clubs or Organizations: Not on file  . Attends Archivist  Meetings: Not on file  . Marital Status: Not on file  Intimate Partner Violence:   . Fear of Current or Ex-Partner: Not on file  . Emotionally Abused: Not on file  . Physically Abused: Not on file  . Sexually Abused: Not on file     Family History  Problem Relation Age of Onset  . Dementia Mother   . Heart disease Father   . Colon cancer Neg Hx   . Prostate cancer Neg Hx   . Diabetes Neg Hx      Current Facility-Administered Medications:  .  acetaminophen (TYLENOL) tablet 650 mg, 650 mg, Oral, Q6H PRN, Ivor Costa, MD .  albuterol (PROVENTIL) (2.5 MG/3ML) 0.083% nebulizer solution 3 mL, 3 mL, Inhalation, Q4H PRN, Ivor Costa, MD .  amiodarone (PACERONE) tablet 400 mg, 400 mg, Oral, BID, Dunn, Ryan M, PA-C, 400 mg at 03/10/19 2029 .  aspirin EC tablet 81 mg, 81 mg,  Oral, Daily, Kate Sable, MD, 81 mg at 03/11/19 0837 .  chlorhexidine (PERIDEX) 0.12 % solution 15 mL, 15 mL, Mouth Rinse, BID, Darel Hong D, NP, 15 mL at 03/11/19 0838 .  Chlorhexidine Gluconate Cloth 2 % PADS 6 each, 6 each, Topical, Q0600, Lateef, Munsoor, MD .  dextromethorphan-guaiFENesin (MUCINEX DM) 30-600 MG per 12 hr tablet 1 tablet, 1 tablet, Oral, BID, Ivor Costa, MD, 1 tablet at 03/11/19 [id number] .  dextrose 50 % solution 0-50 mL, 0-50 mL, Intravenous, PRN, Ivor Costa, MD, 25 mL at 03/08/19 0355 .  dronabinol (MARINOL) capsule 5 mg, 5 mg, Oral, BID AC, Ivor Costa, MD, 5 mg at 03/11/19 0837 .  DULoxetine (CYMBALTA) DR capsule 30 mg, 30 mg, Oral, Daily, Ivor Costa, MD, 30 mg at 03/11/19 0837 .  heparin ADULT infusion 100 units/mL (25000 units/215m sodium chloride 0.45%), 1,650 Units/hr, Intravenous, Continuous, CBenita Gutter RPH, Last Rate: 16.5 mL/hr at 03/11/19 1126, 1,650 Units/hr at 03/11/19 1126 .  insulin aspart (novoLOG) injection 0-15 Units, 0-15 Units, Subcutaneous, Q4H, Sreenath, Sudheer B, MD, 2 Units at 03/11/19 1152 .  ipratropium-albuterol (DUONEB) 0.5-2.5 (3) MG/3ML nebulizer solution 3  mL, 3 mL, Nebulization, TID, Dhungel, Nishant, MD, 3 mL at 03/11/19 0818 .  MEDLINE mouth rinse, 15 mL, Mouth Rinse, q12n4p, KDarel HongD, NP, 15 mL at 03/11/19 1145 .  menthol-cetylpyridinium (CEPACOL) lozenge 3 mg, 1 lozenge, Oral, PRN, YEarlie Server MD .  metoprolol tartrate (LOPRESSOR) tablet 12.5 mg, 12.5 mg, Oral, BID, Dunn, Ryan M, PA-C, 12.5 mg at 03/10/19 2029 .  multivitamin with minerals tablet 1 tablet, 1 tablet, Oral, Daily, NIvor Costa MD, 1 tablet at 03/11/19 0837 .  nicotine (NICODERM CQ - dosed in mg/24 hours) patch 21 mg, 21 mg, Transdermal, Daily, NIvor Costa MD, 21 mg at 03/11/19 0839 .  ondansetron (ZOFRAN) injection 4 mg, 4 mg, Intravenous, Q8H PRN, NIvor Costa MD .  pneumococcal 23 valent vaccine (PNEUMOVAX-23) injection 0.5 mL, 0.5 mL, Intramuscular, Prior to discharge, Sreenath, Sudheer B, MD .  polyethylene glycol (MIRALAX / GLYCOLAX) packet 17 g, 17 g, Oral, Daily, [place], EBing Neighbors NP, 17 g at 03/09/19 1014 .  protein supplement (ENSURE MAX) liquid, 11 oz, Oral, BID BM, Dhungel, Nishant, MD, 11 oz at 03/11/19 0839 .  rosuvastatin (CRESTOR) tablet 10 mg, 10 mg, Oral, Daily, NIvor Costa MD, 10 mg at 03/11/19 0837 .  sodium bicarbonate tablet 1,300 mg, 1,300 mg, Oral, BID, SMurlean Iba MD, 1,300 mg at 03/11/19 0837 .  sodium chloride (OCEAN) 0.65 % nasal spray 1 spray, 1 spray, Each Nare, PRN, OLang Snow NP, 1 spray at 03/11/19 0503 .  thiamine (B-1) 1626mg, folic acid 1 mg in sodium chloride 0.9 % 50 mL injection, , Intravenous, Q24H, GDallie Piles RPH, Stopped at 03/11/19 1145   Physical exam:  Vitals:   03/11/19 0455 03/11/19 0738 03/11/19 0818 03/11/19 1121  BP: 103/88 (!) 99/52  (!) 97/49  Pulse: 65 65  68  Resp: '20 18  18  ' Temp: 98.6 F (37 C) (!) 97.5 F (36.4 C)  (!) 97.5 F (36.4 C)  TempSrc: Oral Oral  Oral  SpO2: 100% 100% 100% 100%  Weight: 202 lb 13.2 oz (92 kg)     Height:       Physical Exam  Constitutional: He is  oriented to person, place, and time. No distress.  HENT:  Head: Normocephalic and atraumatic.  Mouth/Throat: No oropharyngeal exudate.  Eyes: Pupils are equal, round, and reactive  to light. EOM are normal. No scleral icterus.  Cardiovascular: Normal rate and regular rhythm.  No murmur heard. Pulmonary/Chest: Effort normal. No respiratory distress.  Abdominal: Soft. He exhibits no distension.  Musculoskeletal:        General: Normal range of motion.     Cervical back: Normal range of motion and neck supple.     Comments: Right upper arm tenderness  Neurological: He is alert and oriented to person, place, and time.  Skin: Skin is warm and dry. He is not diaphoretic. No erythema.  Ecchymosis and bruising on right upper extremity.  Psychiatric: Affect normal.       CMP Latest Ref Rng & Units 03/11/2019  Glucose 70 - 99 mg/dL 146(H)  BUN 8 - 23 mg/dL 109(H)  Creatinine 0.61 - 1.24 mg/dL 6.41(H)  Sodium 135 - 145 mmol/L 130(L)  Potassium 3.5 - 5.1 mmol/L 3.7  Chloride 98 - 111 mmol/L 96(L)  CO2 22 - 32 mmol/L 25  Calcium 8.9 - 10.3 mg/dL 7.3(L)  Total Protein 6.5 - 8.1 g/dL -  Total Bilirubin 0.3 - 1.2 mg/dL -  Alkaline Phos 38 - 126 U/L -  AST 15 - 41 U/L -  ALT 0 - 44 U/L -   CBC Latest Ref Rng & Units 03/11/2019  WBC 4.0 - 10.5 K/uL 7.1  Hemoglobin 13.0 - 17.0 g/dL 8.0(L)  Hematocrit 39.0 - 52.0 % 22.7(L)  Platelets 150 - 400 K/uL 277    RADIOGRAPHIC STUDIES: I have personally reviewed the radiological images as listed and agreed with the findings in the report. CT HEAD WO CONTRAST  Result Date: 03/05/2019 CLINICAL DATA:  Metastatic disease evaluation.  Lung cancer. EXAM: CT HEAD WITHOUT CONTRAST TECHNIQUE: Contiguous axial images were obtained from the base of the skull through the vertex without intravenous contrast. COMPARISON:  MRI head 09/20/2018 FINDINGS: Brain: Mild atrophy. Mild hypodensity in the white matter unchanged from the prior MRI. No acute infarct, hemorrhage,  mass. No edema or midline shift. Vascular: Negative for hyperdense vessel Skull: Negative skull. Sinuses/Orbits: Mucosal edema and bony thickening right maxillary sinus. Chronic nasal bone fracture. Left cataract extraction. Other: None IMPRESSION: No acute abnormality and negative for metastatic disease on unenhanced CT. Atrophy and mild chronic microvascular ischemia. Electronically Signed   By: Franchot Gallo M.D.   On: 03/05/2019 15:28   US RENAL  Result Date: 03/10/2019 CLINICAL DATA:  64 year old presenting with acute renal failure. Evaluate for obstruction as a possible cause. EXAM: RENAL / URINARY TRACT ULTRASOUND COMPLETE COMPARISON:  Urinary tract ultrasound 03/04/2019. CT abdomen and pelvis 01/22/2019. FINDINGS: Right Kidney: Renal measurements: Approximately 12.6 x 6.4 x 6.2 cm = volume: 264 mL. Mildly echogenic parenchyma. No hydronephrosis. Well-preserved cortex. No shadowing calculi. No focal parenchymal abnormality. Left Kidney: Renal measurements: Approximately 12.5 x 6.3 x 6.9 cm = volume: 283 mL. Mildly echogenic parenchyma. No hydronephrosis. Well-preserved cortex. No shadowing calculi. No focal parenchymal abnormality. Bladder: Normal for degree of bladder distention. Other: None. IMPRESSION: 1. Mildly echogenic renal parenchyma indicative of medical renal disease. 2. Otherwise normal examination. Specifically, no evidence of urinary tract obstruction to account for the acute renal insufficiency. Electronically Signed   By: Evangeline Dakin M.D.   On: 03/10/2019 15:02   US RENAL  Result Date: 03/04/2019 CLINICAL DATA:  Acute kidney injury EXAM: RENAL / URINARY TRACT ULTRASOUND COMPLETE COMPARISON:  CT 01/22/2019 FINDINGS: Right Kidney: Renal measurements: 11.4 x 6 x 6 cm = volume: 215 mL . Echogenicity within normal limits. No mass  or hydronephrosis visualized. Left Kidney: Renal measurements: 11.8 x 7.3 x 6.5 cm = volume: 291.8 mL. Echogenicity within normal limits. No mass or  hydronephrosis visualized. Bladder: Appears normal for degree of bladder distention. Other: Incidental note made of gallstones and small effusions. Heterogenous prostate. IMPRESSION: Normal ultrasound appearance of the kidneys Electronically Signed   By: Donavan Foil M.D.   On: 03/04/2019 15:37   DG Chest Portable 1 View  Result Date: 03/03/2019 CLINICAL DATA:  Shortness of breath, since Monday worsening this morning around 1 a.m. EXAM: PORTABLE CHEST 1 VIEW COMPARISON:  12/19/2018 FINDINGS: Cardiomediastinal contours are stable following median sternotomy with persistent left-sided pleural effusion and pleural-parenchymal scarring. No new areas of consolidation or evidence of right-sided pleural effusion. No signs of acute bone process. IMPRESSION: Persistent left-sided pleural effusion and pleural-parenchymal scarring. Electronically Signed   By: Zetta Bills M.D.   On: 03/03/2019 13:59   ECHOCARDIOGRAM COMPLETE  Result Date: 03/04/2019    ECHOCARDIOGRAM REPORT   Patient Name:   Craig Koch Date of Exam: 03/04/2019 Medical Rec #:  [id number]       Height:       72.0 in Accession #:    [id number]      Weight:       185.0 lb Date of Birth:  [birth year]       BSA:          2.061 m Patient Age:    63 years        BP:           125/72 mmHg Patient Gender: M               HR:           119 bpm. Exam Location:  [facility] Procedure: 2D Echo, Color Doppler and Cardiac Doppler Indications:     I21.4 NSTEMI  History:         Patient has prior history of Echocardiogram examinations, most                  recent 08/24/2018. CAD, Prior CABG; Risk Factors:Hypertension,                  Dyslipidemia and Diabetes. Lung Cancer.  Sonographer:     Charmayne Sheer RDCS (AE) Referring Phys:  [place] Phys: Kathlyn Sacramento MD IMPRESSIONS  1. Left ventricular ejection fraction, by estimation, is 30 to 35%. The left ventricle has moderately decreased function. The left ventricle demonstrates global hypokinesis. Left  ventricular diastolic parameters are indeterminate.  2. Right ventricular systolic function is normal. The right ventricular size is normal. Tricuspid regurgitation signal is inadequate for assessing PA pressure.  3. Left atrial size was mild to moderately dilated.  4. The mitral valve is normal in structure and function. Moderate mitral valve regurgitation. No evidence of mitral stenosis.  5. The aortic valve is normal in structure and function. Aortic valve regurgitation is trivial. Mild to moderate aortic valve sclerosis/calcification is present, without any evidence of aortic stenosis.  6. The inferior vena cava is normal in size with greater than 50% respiratory variability, suggesting right atrial pressure of 3 mmHg. FINDINGS  Left Ventricle: Left ventricular ejection fraction, by estimation, is 30 to 35%. The left ventricle has moderately decreased function. The left ventricle demonstrates global hypokinesis. The left ventricular internal cavity size was normal in size. There is no left ventricular hypertrophy. Left ventricular diastolic parameters are indeterminate. Right Ventricle: The right ventricular size  is normal. No increase in right ventricular wall thickness. Right ventricular systolic function is normal. Tricuspid regurgitation signal is inadequate for assessing PA pressure. Left Atrium: Left atrial size was mild to moderately dilated. Right Atrium: Right atrial size was normal in size. Pericardium: There is no evidence of pericardial effusion. Mitral Valve: The mitral valve is normal in structure and function. Normal mobility of the mitral valve leaflets. Moderate mitral valve regurgitation. No evidence of mitral valve stenosis. MV peak gradient, 7.5 mmHg. The mean mitral valve gradient is 4.0  mmHg. Tricuspid Valve: The tricuspid valve is normal in structure. Tricuspid valve regurgitation is not demonstrated. No evidence of tricuspid stenosis. Aortic Valve: The aortic valve is normal in structure  and function. Aortic valve regurgitation is trivial. Aortic regurgitation PHT measures 281 msec. Mild to moderate aortic valve sclerosis/calcification is present, without any evidence of aortic stenosis. Aortic valve mean gradient measures 5.0 mmHg. Aortic valve peak gradient measures 8.5 mmHg. Aortic valve area, by VTI measures 2.53 cm. Pulmonic Valve: The pulmonic valve was normal in structure. Pulmonic valve regurgitation is not visualized. No evidence of pulmonic stenosis. Aorta: The aortic root is normal in size and structure. Venous: The inferior vena cava is normal in size with greater than 50% respiratory variability, suggesting right atrial pressure of 3 mmHg. IAS/Shunts: No atrial level shunt detected by color flow Doppler.  LEFT VENTRICLE PLAX 2D LVIDd:         5.21 cm  Diastology LVIDs:         4.21 cm  LV e' lateral:   7.83 cm/s LV PW:         0.84 cm  LV E/e' lateral: 13.4 LV IVS:        0.89 cm  LV e' medial:    7.94 cm/s LVOT diam:     2.40 cm  LV E/e' medial:  13.2 LV SV:         54 LV SV Index:   26 LVOT Area:     4.52 cm  RIGHT VENTRICLE RV Basal diam:  3.08 cm LEFT ATRIUM             Index       RIGHT ATRIUM           Index LA diam:        4.70 cm 2.28 cm/m  RA Area:     14.10 cm LA Vol (A2C):   62.0 ml 30.08 ml/m RA Volume:   35.20 ml  17.08 ml/m LA Vol (A4C):   68.6 ml 33.29 ml/m LA Biplane Vol: 65.3 ml 31.68 ml/m  AORTIC VALVE                    PULMONIC VALVE AV Area (Vmax):    2.95 cm     PV Vmax:       1.10 m/s AV Area (Vmean):   2.86 cm     PV Vmean:      68.400 cm/s AV Area (VTI):     2.53 cm     PV VTI:        0.146 m AV Vmax:           146.00 cm/s  PV Peak grad:  4.8 mmHg AV Vmean:          103.000 cm/s PV Mean grad:  2.0 mmHg AV VTI:            0.213 m AV Peak Grad:      8.5  mmHg AV Mean Grad:      5.0 mmHg LVOT Vmax:         95.30 cm/s LVOT Vmean:        65.200 cm/s LVOT VTI:          0.119 m LVOT/AV VTI ratio: 0.56 AI PHT:            281 msec  AORTA Ao Root diam: 3.00 cm  MITRAL VALVE MV Area (PHT): 3.92 cm     SHUNTS MV Peak grad:  7.5 mmHg     Systemic VTI:  0.12 m MV Mean grad:  4.0 mmHg     Systemic Diam: 2.40 cm MV Vmax:       1.37 m/s MV Vmean:      92.7 cm/s MV Decel Time: 194 msec MV E velocity: 104.80 cm/s Kathlyn Sacramento MD Electronically signed by Kathlyn Sacramento MD Signature Date/Time: 03/04/2019/2:46:43 PM    Final    [place] RT UPPER EXTREM LTD SOFT TISSUE NON VASCULAR  Result Date: 03/08/2019 CLINICAL DATA:  Swelling and bruising of the right upper extremity. Possible hematoma. EXAM: ULTRASOUND right UPPER EXTREMITY LIMITED TECHNIQUE: Ultrasound examination of the upper extremity soft tissues was performed in the area of clinical concern. COMPARISON:  None. FINDINGS: Diffuse subcutaneous soft tissue swelling/edema and streaky fluid. No discrete fluid collection or hematoma. The underlying musculature is grossly normal. IMPRESSION: Diffuse subcutaneous soft tissue swelling/edema/streaky fluid but no discrete abscess or hematoma. Electronically Signed   By: Marijo Sanes M.D.   On: 03/08/2019 19:13    Assessment and plan-  Patient is a 64 y.o. male with history of stage IV lung cancer, BRAF mutation on targeted therapy, hypertension, hyperlipidemia, CAD with history of CABG, diabetes, anxiety presented to emergency room due to breathing difficulties.he was found have DKA, AKI, mental status change.  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  He has had excellent response to targeted therapy which was documented on CT scan on 01/22/2019.  Cancer treatment dabrafenib and trametinib are held due to acute illness.  #Altered mental status, mental status has resolved and not at baseline. #Acute renal failure, ATN, creatinine continues to rise.  Nephrology following up. Patient has agreed with hemodialysis.  Patient is going to have temporary dialysis catheter placed this  afternoon.  #Acute NSTEMI /atrial fibrillation with RVR, cardiology on board.  Acute CHF, ejection fraction decreased to 30%.  He is currently on heparin infusion.  A. fib, rate controlled.  On amiodarone.  Managed by cardiology. Patient has history of CAD/CABG.  Dabrafenib may have further contributed to his CHF. Hold dabrafenib and trametinib.  Right upper extremity tenderness, due to subcutaneous soft tissue swelling/edema.  No hematoma on ultrasound. Right arm IV has been discontinued.  Apply ice. #Microcytic anemia, hemoglobin has trended down to 9.2.  MCV decreased to 77.  Iron panel showed ferritin 676, iron saturation 10.  TIBC 183.  Anemia secondary to CKD.  Ferritin can be falsely elevated due to the acute illness.   I will patient on oral iron supplementation.   CODE STATUS, DNR/DNI. Thank you for allowing me to participate in the care of this patient.   Earlie Server, MD, PhD Hematology Oncology [facility] Pager- [phone] 03/11/2019

## 2019-03-11 NOTE — Progress Notes (Signed)
Pre HD Treatment : Pt is receiving his first hemodialysis treatment after having right femoral tem cath placed   03/11/19 1600  Vital Signs  Temp 97.6 F (36.4 C)  Temp Source Oral  Pulse Rate 71  Pulse Rate Source Monitor  Resp 18  BP (!) 102/55  BP Location Left Arm  BP Method Automatic  Patient Position (if appropriate) Lying  Oxygen Therapy  SpO2 100 %  O2 Device Room Air  Pain Assessment  Pain Scale 0-10  Pain Score 0  Dialysis Weight  Weight 92 kg  Type of Weight Pre-Dialysis  Time-Out for Hemodialysis  What Procedure? HD   Pt Identifiers(min of two) First/Last Name;MRN/Account#  Correct Site? Yes  Correct Side? Yes  Correct Procedure? Yes  Consents Verified? Yes  Rad Studies Available? Yes  Safety Precautions Reviewed? Yes  Engineer, civil (consulting) Number 2  Station Number 1  UF/Alarm Test Passed  Conductivity: Meter 14  Conductivity: Machine  14.1  pH 7.2  Reverse Osmosis Main  Normal Saline Lot Number [id number]  Dialyzer Lot Number [id number]  Disposable Set Lot Number [id number]  Machine Temperature 98.6 F (37 C)  Musician and Audible Yes  Blood Lines Intact and Secured Yes  Pre Treatment Patient Checks  Vascular access used during treatment Catheter  HD catheter dressing before treatment WDL  Hepatitis B Surface Antigen Results Pending  Isolation Initiated Yes  Hemodialysis Consent Verified Yes  Hemodialysis Standing Orders Initiated Yes  ECG (Telemetry) Monitor On Yes  Prime Ordered Normal Saline  Length of  DialysisTreatment -hour(s) 1.5 Hour(s)  Dialysis Treatment Comments Na 140 bfr150  Dialyzer Elisio 17H NR  Dialysate 3K;2.5 Ca  Dialysis Anticoagulant None  Dialysate Flow Ordered 300  Blood Flow Rate Ordered 150 mL/min  Ultrafiltration Goal 0 Liters  Pre Treatment Labs Hepatitis B Surface Antigen;Phosphorus  Dialysis Blood Pressure Support Ordered Normal Saline  Education / Care Plan  Dialysis Education Provided Yes  Documented  Education in Care Plan Yes  Hemodialysis Catheter Right Femoral vein Triple lumen Temporary (Non-Tunneled)  Placement Date/Time: 03/11/19 1532   Time Out: Correct patient;Correct site;Correct procedure  Maximum sterile barrier precautions: Hand hygiene;Cap;Mask;Sterile gown;Sterile gloves;Large sterile sheet  Site Prep: Chlorhexidine (preferred)  Local Anes...  Site Condition No complications  Blue Lumen Status Blood return noted  Red Lumen Status Blood return noted  Purple Lumen Status Capped (Central line)  Dressing Type Biopatch;Occlusive  Dressing Status Clean;Dry;Intact

## 2019-03-11 NOTE — Progress Notes (Signed)
Post HD Assessment    03/11/19 1811  Neurological  Level of Consciousness Alert  Orientation Level Oriented X4  Respiratory  Respiratory Pattern Regular  Chest Assessment Chest expansion symmetrical  Bilateral Breath Sounds Diminished  Cough None  Cardiac  Pulse Regular  ECG Monitor Yes  Vascular  R Radial Pulse +2  L Radial Pulse +2  Edema Generalized  Integumentary  Integumentary (WDL) X  Musculoskeletal  Musculoskeletal (WDL) X  Generalized Weakness Yes  GU Assessment  Genitourinary (WDL) X  Psychosocial  Psychosocial (WDL) WDL  Patient Behaviors Calm;Cooperative

## 2019-03-11 NOTE — Progress Notes (Signed)
PROGRESS NOTE                                                                                                                                                                                                             Patient Demographics:    Craig Koch, is a 64 y.o. male, DOB - January 08, 1955, NGE:[id number]  Admit date - 03/03/2019   Admitting Physician Ivor Costa, MD  Outpatient Primary MD for the patient is Tonia Ghent, MD  LOS - 8  Outpatient Specialists: Oncology  Chief Complaint  Patient presents with   Shortness of Breath       Brief Narrative 64 year old male with hypertension, hyperlipidemia, type 2 diabetes mellitus, anxiety depression, stage IV lung cancer, CAD with history of CABG, tobacco and alcohol use who presented with generalized weakness and shortness of breath.  In the ED he was found to be in DKA, AKI with creatinine of 1.6, new onset A. fib with RVR.  Required BiPAP and admission to stepdown unit. Hospital course prolonged due to persistent encephalopathy, rapid A. fib, and ATN.   Subjective:   Still feels tired but reports breathing better.   Assessment  & Plan :    Principal Problem: Acute tubular necrosis Suspect prerenal with hypovolemia from ketoacidosis.  Creatinine continues to worsen without improvement with fluids and IV bicarb.   Urine output only 550 cc.  Labs sent for SPEP, UPEP, ANA, ANCA, GBM, complements. Renal plan on temporary dialysis.  Vascular surgery consult did for dialysis access. Monitor UOP and renal function closely.   Active symptoms New onset atrial fibrillation with RVR ([place]) Has been in sinus.  Amiodarone dose adjusted cardiology recommends to continue amiodarone 40 mg twice daily until 3/10, then reduce to 200 mg twice daily. Still on IV heparin.  Unable to transition to Eliquis or Xarelto due to his poor renal function. Continue low-dose  metoprolol.   Acute metabolic encephalopathy Possibly related to alcohol use.  Currently resolved.  Antibiotic discontinued.  Acute systolic CHF (EF 40-10%) NSTEMI Presented with markedly elevated troponin and suspect NSTEMI with respiratory failure and DKA. Continue IV heparin, beta-blocker and statin.  Metoprolol dose reduced.  Further ischemic evaluation upon resolution of acute issues. Continue strict I's/O and daily weight.  Uncontrolled type II diabetes mellitus with DKA DKA resolved on insulin drip.  Patient became  hypoglycemic on 3/5 possibly due to increased dose of Lantus and worsened renal function.  Off insulin and being monitored on sliding scale only.  CBG stable.  Alcohol abuse No withdrawal symptoms.  Tobacco abuse Nicotine patch.   Stage IV lung adenocarcinoma Dr. Tasia Catchings following him in the hospital.  Recommend to hold dabrafenib and trametinib.  Iron deficiency anemia We will plan on iron infusion during hospital stay.  Hypothermia Secondary to DKA.  Resolved    Code Status : DNR (being followed by palliative care)  Family Communication  : Wife involved in care  Disposition Plan  : Needs SNF.  Hospital course prolonged due to need for temporary dialysis.  Barriers For Discharge : Ongoing several medical issues.  (Need for IV heparin, progressive ATN with need for inpatient temporary dialysis.  Consults  : Cardiology, nephrology, critical care, oncology  Procedures  : 2D echo, head CT  DVT Prophylaxis  : IV heparin  Lab Results  Component Value Date   PLT 277 03/11/2019    Antibiotics  :    Anti-infectives (From admission, onward)   Start     Dose/Rate Route Frequency Ordered Stop   03/07/19 1200  vancomycin (VANCOCIN) IVPB 1000 mg/200 mL premix     1,000 mg 200 mL/hr over 60 Minutes Intravenous  Once 03/07/19 0801 03/07/19 2024   03/06/19 2200  piperacillin-tazobactam (ZOSYN) IVPB 3.375 g  Status:  Discontinued     3.375 g 12.5 mL/hr over  240 Minutes Intravenous Every 12 hours 03/06/19 0858 03/08/19 1408   03/05/19 1420  vancomycin variable dose per unstable renal function (pharmacist dosing)  Status:  Discontinued      Does not apply See admin instructions 03/05/19 1420 03/08/19 1408   03/05/19 1200  vancomycin (VANCOREADY) IVPB 1500 mg/300 mL     1,500 mg 150 mL/hr over 120 Minutes Intravenous  Once 03/05/19 0944 03/06/19 2324   03/05/19 1100  piperacillin-tazobactam (ZOSYN) IVPB 3.375 g  Status:  Discontinued     3.375 g 12.5 mL/hr over 240 Minutes Intravenous Every 8 hours 03/05/19 0944 03/06/19 0858        Objective:   Vitals:   03/11/19 0455 03/11/19 0738 03/11/19 0818 03/11/19 1121  BP: 103/88 (!) 99/52  (!) 97/49  Pulse: 65 65  68  Resp: 20 18  18   Temp: 98.6 F (37 C) (!) 97.5 F (36.4 C)  (!) 97.5 F (36.4 C)  TempSrc: Oral Oral  Oral  SpO2: 100% 100% 100% 100%  Weight: 92 kg     Height:        Wt Readings from Last 3 Encounters:  03/11/19 92 kg  02/14/19 86.6 kg  02/08/19 86.9 kg     Intake/Output Summary (Last 24 hours) at 03/11/2019 1242 Last data filed at 03/11/2019 0629 Gross per 24 hour  Intake 1650.11 ml  Output 450 ml  Net 1200.11 ml   Physical exam Fatigued, not in distress HEENT: Pallor present, moist mucosa, supple neck Chest: Clear CVs: Normal S1-S2 GI: Soft, nondistended, nontender Musculoskeletal: Warm, no edema     Data Review:    CBC Recent Labs  Lab 03/07/19 0310 03/08/19 0707 03/09/19 0356 03/10/19 0319 03/11/19 0643  WBC 6.3 7.9 7.7 6.3 7.1  HGB 10.2* 9.2* 9.0* 8.0* 8.0*  HCT 28.3* 26.2* 26.7* 23.1* 22.7*  PLT 244 256 237 244 277  MCV 75.7* 77.1* 81.4 78.0* 77.2*  MCH 27.3 27.1 27.4 27.0 27.2  MCHC 36.0 35.1 33.7 34.6 35.2  RDW 17.5*  18.4* 19.9* 19.0* 18.5*    Chemistries  Recent Labs  Lab 03/05/19 0153 03/05/19 0153 03/06/19 0152 03/06/19 0152 03/07/19 0310 03/08/19 0707 03/09/19 0356 03/10/19 0319 03/11/19 0643  NA 128*   < > 129*   < >  130* 132* 132* 130* 130*  K 3.5   < > 3.3*   < > 3.3* 3.5 4.0 4.0 3.7  CL 102   < > 102   < > 101 105 105 96* 96*  CO2 15*   < > 14*   < > 16* 13* 15* 15* 25  GLUCOSE 311*   < > 182*   < > 144* 59* 90 155* 146*  BUN 65*   < > 75*   < > 84* 92* 98* 101* 109*  CREATININE 3.16*   < > 4.22*   < > 5.25* 5.94* 5.94* 6.09* 6.41*  CALCIUM 8.0*   < > 8.2*   < > 8.2* 7.9* 7.9* 7.5* 7.3*  MG 1.9  --  1.9  --   --   --   --   --   --    < > = values in this interval not displayed.   ------------------------------------------------------------------------------------------------------------------ No results for input(s): CHOL, HDL, LDLCALC, TRIG, CHOLHDL, LDLDIRECT in the last 72 hours.  Lab Results  Component Value Date   HGBA1C 11.3 (H) 03/04/2019   ------------------------------------------------------------------------------------------------------------------ No results for input(s): TSH, T4TOTAL, T3FREE, THYROIDAB in the last 72 hours.  Invalid input(s): FREET3 ------------------------------------------------------------------------------------------------------------------ Recent Labs    03/09/19 0009 03/09/19 0356  FERRITIN 676*  --   TIBC 183*  --   IRON 19*  --   RETICCTPCT  --  0.3*    Coagulation profile No results for input(s): INR, PROTIME in the last 168 hours.  No results for input(s): DDIMER in the last 72 hours.  Cardiac Enzymes No results for input(s): CKMB, TROPONINI, MYOGLOBIN in the last 168 hours.  Invalid input(s): CK ------------------------------------------------------------------------------------------------------------------    Component Value Date/Time   BNP 330.0 (H) 03/03/2019 1330    Inpatient Medications  Scheduled Meds:  amiodarone  400 mg Oral BID   aspirin EC  81 mg Oral Daily   chlorhexidine  15 mL Mouth Rinse BID   Chlorhexidine Gluconate Cloth  6 each Topical Q0600   dextromethorphan-guaiFENesin  1 tablet Oral BID   dronabinol   5 mg Oral BID AC   DULoxetine  30 mg Oral Daily   insulin aspart  0-15 Units Subcutaneous Q4H   ipratropium-albuterol  3 mL Nebulization TID   mouth rinse  15 mL Mouth Rinse q12n4p   metoprolol tartrate  12.5 mg Oral BID   multivitamin with minerals  1 tablet Oral Daily   nicotine  21 mg Transdermal Daily   polyethylene glycol  17 g Oral Daily   Ensure Max Protein  11 oz Oral BID BM   rosuvastatin  10 mg Oral Daily   sodium bicarbonate  1,300 mg Oral BID   Continuous Infusions:  heparin 1,650 Units/hr (03/11/19 1126)   small volume/piggyback builder Stopped (03/11/19 1145)   PRN Meds:.acetaminophen, albuterol, dextrose, menthol-cetylpyridinium, ondansetron (ZOFRAN) IV, pneumococcal 23 valent vaccine, sodium chloride  Micro Results Recent Results (from the past 240 hour(s))  Respiratory Panel by RT PCR (Flu A&B, Covid) - Nasopharyngeal Swab     Status: None   Collection Time: 03/03/19  3:31 PM   Specimen: Nasopharyngeal Swab  Result Value Ref Range Status   SARS Coronavirus 2 by RT PCR NEGATIVE NEGATIVE  Final    Comment: (NOTE) SARS-CoV-2 target nucleic acids are NOT DETECTED. The SARS-CoV-2 RNA is generally detectable in upper respiratoy specimens during the acute phase of infection. The lowest concentration of SARS-CoV-2 viral copies this assay can detect is 131 copies/mL. A negative result does not preclude SARS-Cov-2 infection and should not be used as the sole basis for treatment or other patient management decisions. A negative result may occur with  improper specimen collection/handling, submission of specimen other than nasopharyngeal swab, presence of viral mutation(s) within the areas targeted by this assay, and inadequate number of viral copies (<131 copies/mL). A negative result must be combined with clinical observations, patient history, and epidemiological information. The expected result is Negative. Fact Sheet for Patients:   PinkCheek.be Fact Sheet for Healthcare Providers:  GravelBags.it This test is not yet ap proved or cleared by the [place] FDA and  has been authorized for detection and/or diagnosis of SARS-CoV-2 by FDA under an Emergency Use Authorization (EUA). This EUA will remain  in effect (meaning this test can be used) for the duration of the COVID-19 declaration under Section 564(b)(1) of the Act, 21 U.S.C. section 360bbb-3(b)(1), unless the authorization is terminated or revoked sooner.    Influenza A by PCR NEGATIVE NEGATIVE Final   Influenza B by PCR NEGATIVE NEGATIVE Final    Comment: (NOTE) The Xpert Xpress SARS-CoV-2/FLU/RSV assay is intended as an aid in  the diagnosis of influenza from Nasopharyngeal swab specimens and  should not be used as a sole basis for treatment. Nasal washings and  aspirates are unacceptable for Xpert Xpress SARS-CoV-2/FLU/RSV  testing. Fact Sheet for Patients: PinkCheek.be Fact Sheet for Healthcare Providers: GravelBags.it This test is not yet approved or cleared by the [place] FDA and  has been authorized for detection and/or diagnosis of SARS-CoV-2 by  FDA under an Emergency Use Authorization (EUA). This EUA will remain  in effect (meaning this test can be used) for the duration of the  Covid-19 declaration under Section 564(b)(1) of the Act, 21  U.S.C. section 360bbb-3(b)(1), unless the authorization is  terminated or revoked. Performed at [facility], [address] [place], [place] [address]   MRSA PCR Screening     Status: None   Collection Time: 03/03/19  7:48 PM   Specimen: Nasopharyngeal  Result Value Ref Range Status   MRSA by PCR NEGATIVE NEGATIVE Final    Comment:        The GeneXpert MRSA Assay (FDA approved for NASAL specimens only), is one component of a comprehensive MRSA colonization surveillance  program. It is not intended to diagnose MRSA infection nor to guide or monitor treatment for MRSA infections. Performed at [facility], [address] [place], [place] [address]   CULTURE, BLOOD (ROUTINE X 2) w Reflex to ID Panel     Status: None   Collection Time: 03/05/19 10:16 AM   Specimen: BLOOD  Result Value Ref Range Status   Specimen Description BLOOD LEFT ANTECUBITAL  Final   Special Requests   Final    BOTTLES DRAWN AEROBIC AND ANAEROBIC Blood Culture adequate volume   Culture   Final    NO GROWTH 5 DAYS Performed at [facility], [address] [place], [place] [address]    Report Status 03/10/2019 FINAL  Final  CULTURE, BLOOD (ROUTINE X 2) w Reflex to ID Panel     Status: None   Collection Time: 03/05/19 10:22 AM   Specimen: BLOOD  Result Value Ref Range  Status   Specimen Description BLOOD BLOOD LEFT HAND  Final   Special Requests   Final    BOTTLES DRAWN AEROBIC ONLY Blood Culture adequate volume   Culture   Final    NO GROWTH 5 DAYS Performed at [facility], [address], [place], [place] [address]    Report Status 03/10/2019 FINAL  Final    Radiology Reports CT HEAD WO CONTRAST  Result Date: 03/05/2019 CLINICAL DATA:  Metastatic disease evaluation.  Lung cancer. EXAM: CT HEAD WITHOUT CONTRAST TECHNIQUE: Contiguous axial images were obtained from the base of the skull through the vertex without intravenous contrast. COMPARISON:  MRI head 09/20/2018 FINDINGS: Brain: Mild atrophy. Mild hypodensity in the white matter unchanged from the prior MRI. No acute infarct, hemorrhage, mass. No edema or midline shift. Vascular: Negative for hyperdense vessel Skull: Negative skull. Sinuses/Orbits: Mucosal edema and bony thickening right maxillary sinus. Chronic nasal bone fracture. Left cataract extraction. Other: None IMPRESSION: No acute abnormality and negative for metastatic disease on unenhanced CT. Atrophy and mild chronic microvascular  ischemia. Electronically Signed   By: Franchot Gallo M.D.   On: 03/05/2019 15:28   US RENAL  Result Date: 03/10/2019 CLINICAL DATA:  64 year old presenting with acute renal failure. Evaluate for obstruction as a possible cause. EXAM: RENAL / URINARY TRACT ULTRASOUND COMPLETE COMPARISON:  Urinary tract ultrasound 03/04/2019. CT abdomen and pelvis 01/22/2019. FINDINGS: Right Kidney: Renal measurements: Approximately 12.6 x 6.4 x 6.2 cm = volume: 264 mL. Mildly echogenic parenchyma. No hydronephrosis. Well-preserved cortex. No shadowing calculi. No focal parenchymal abnormality. Left Kidney: Renal measurements: Approximately 12.5 x 6.3 x 6.9 cm = volume: 283 mL. Mildly echogenic parenchyma. No hydronephrosis. Well-preserved cortex. No shadowing calculi. No focal parenchymal abnormality. Bladder: Normal for degree of bladder distention. Other: None. IMPRESSION: 1. Mildly echogenic renal parenchyma indicative of medical renal disease. 2. Otherwise normal examination. Specifically, no evidence of urinary tract obstruction to account for the acute renal insufficiency. Electronically Signed   By: Evangeline Dakin M.D.   On: 03/10/2019 15:02   US RENAL  Result Date: 03/04/2019 CLINICAL DATA:  Acute kidney injury EXAM: RENAL / URINARY TRACT ULTRASOUND COMPLETE COMPARISON:  CT 01/22/2019 FINDINGS: Right Kidney: Renal measurements: 11.4 x 6 x 6 cm = volume: 215 mL . Echogenicity within normal limits. No mass or hydronephrosis visualized. Left Kidney: Renal measurements: 11.8 x 7.3 x 6.5 cm = volume: 291.8 mL. Echogenicity within normal limits. No mass or hydronephrosis visualized. Bladder: Appears normal for degree of bladder distention. Other: Incidental note made of gallstones and small effusions. Heterogenous prostate. IMPRESSION: Normal ultrasound appearance of the kidneys Electronically Signed   By: Donavan Foil M.D.   On: 03/04/2019 15:37   DG Chest Portable 1 View  Result Date: 03/03/2019 CLINICAL DATA:   Shortness of breath, since Monday worsening this morning around 1 a.m. EXAM: PORTABLE CHEST 1 VIEW COMPARISON:  12/19/2018 FINDINGS: Cardiomediastinal contours are stable following median sternotomy with persistent left-sided pleural effusion and pleural-parenchymal scarring. No new areas of consolidation or evidence of right-sided pleural effusion. No signs of acute bone process. IMPRESSION: Persistent left-sided pleural effusion and pleural-parenchymal scarring. Electronically Signed   By: Zetta Bills M.D.   On: 03/03/2019 13:59   ECHOCARDIOGRAM COMPLETE  Result Date: 03/04/2019    ECHOCARDIOGRAM REPORT   Patient Name:   Craig Koch Date of Exam: 03/04/2019 Medical Rec #:  [id number]       Height:       72.0 in Accession #:  [id number]      Weight:       185.0 lb Date of Birth:  [birth year]       BSA:          2.061 m Patient Age:    71 years        BP:           125/72 mmHg Patient Gender: M               HR:           119 bpm. Exam Location:  [facility] Procedure: 2D Echo, Color Doppler and Cardiac Doppler Indications:     I21.4 NSTEMI  History:         Patient has prior history of Echocardiogram examinations, most                  recent 08/24/2018. CAD, Prior CABG; Risk Factors:Hypertension,                  Dyslipidemia and Diabetes. Lung Cancer.  Sonographer:     Charmayne Sheer RDCS (AE) Referring Phys:  [place] Phys: Kathlyn Sacramento MD IMPRESSIONS  1. Left ventricular ejection fraction, by estimation, is 30 to 35%. The left ventricle has moderately decreased function. The left ventricle demonstrates global hypokinesis. Left ventricular diastolic parameters are indeterminate.  2. Right ventricular systolic function is normal. The right ventricular size is normal. Tricuspid regurgitation signal is inadequate for assessing PA pressure.  3. Left atrial size was mild to moderately dilated.  4. The mitral valve is normal in structure and function. Moderate mitral valve regurgitation. No  evidence of mitral stenosis.  5. The aortic valve is normal in structure and function. Aortic valve regurgitation is trivial. Mild to moderate aortic valve sclerosis/calcification is present, without any evidence of aortic stenosis.  6. The inferior vena cava is normal in size with greater than 50% respiratory variability, suggesting right atrial pressure of 3 mmHg. FINDINGS  Left Ventricle: Left ventricular ejection fraction, by estimation, is 30 to 35%. The left ventricle has moderately decreased function. The left ventricle demonstrates global hypokinesis. The left ventricular internal cavity size was normal in size. There is no left ventricular hypertrophy. Left ventricular diastolic parameters are indeterminate. Right Ventricle: The right ventricular size is normal. No increase in right ventricular wall thickness. Right ventricular systolic function is normal. Tricuspid regurgitation signal is inadequate for assessing PA pressure. Left Atrium: Left atrial size was mild to moderately dilated. Right Atrium: Right atrial size was normal in size. Pericardium: There is no evidence of pericardial effusion. Mitral Valve: The mitral valve is normal in structure and function. Normal mobility of the mitral valve leaflets. Moderate mitral valve regurgitation. No evidence of mitral valve stenosis. MV peak gradient, 7.5 mmHg. The mean mitral valve gradient is 4.0  mmHg. Tricuspid Valve: The tricuspid valve is normal in structure. Tricuspid valve regurgitation is not demonstrated. No evidence of tricuspid stenosis. Aortic Valve: The aortic valve is normal in structure and function. Aortic valve regurgitation is trivial. Aortic regurgitation PHT measures 281 msec. Mild to moderate aortic valve sclerosis/calcification is present, without any evidence of aortic stenosis. Aortic valve mean gradient measures 5.0 mmHg. Aortic valve peak gradient measures 8.5 mmHg. Aortic valve area, by VTI measures 2.53 cm. Pulmonic Valve: The  pulmonic valve was normal in structure. Pulmonic valve regurgitation is not visualized. No evidence of pulmonic stenosis. Aorta: The aortic root is normal in size and structure. Venous: The inferior vena  cava is normal in size with greater than 50% respiratory variability, suggesting right atrial pressure of 3 mmHg. IAS/Shunts: No atrial level shunt detected by color flow Doppler.  LEFT VENTRICLE PLAX 2D LVIDd:         5.21 cm  Diastology LVIDs:         4.21 cm  LV e' lateral:   7.83 cm/s LV PW:         0.84 cm  LV E/e' lateral: 13.4 LV IVS:        0.89 cm  LV e' medial:    7.94 cm/s LVOT diam:     2.40 cm  LV E/e' medial:  13.2 LV SV:         54 LV SV Index:   26 LVOT Area:     4.52 cm  RIGHT VENTRICLE RV Basal diam:  3.08 cm LEFT ATRIUM             Index       RIGHT ATRIUM           Index LA diam:        4.70 cm 2.28 cm/m  RA Area:     14.10 cm LA Vol (A2C):   62.0 ml 30.08 ml/m RA Volume:   35.20 ml  17.08 ml/m LA Vol (A4C):   68.6 ml 33.29 ml/m LA Biplane Vol: 65.3 ml 31.68 ml/m  AORTIC VALVE                    PULMONIC VALVE AV Area (Vmax):    2.95 cm     PV Vmax:       1.10 m/s AV Area (Vmean):   2.86 cm     PV Vmean:      68.400 cm/s AV Area (VTI):     2.53 cm     PV VTI:        0.146 m AV Vmax:           146.00 cm/s  PV Peak grad:  4.8 mmHg AV Vmean:          103.000 cm/s PV Mean grad:  2.0 mmHg AV VTI:            0.213 m AV Peak Grad:      8.5 mmHg AV Mean Grad:      5.0 mmHg LVOT Vmax:         95.30 cm/s LVOT Vmean:        65.200 cm/s LVOT VTI:          0.119 m LVOT/AV VTI ratio: 0.56 AI PHT:            281 msec  AORTA Ao Root diam: 3.00 cm MITRAL VALVE MV Area (PHT): 3.92 cm     SHUNTS MV Peak grad:  7.5 mmHg     Systemic VTI:  0.12 m MV Mean grad:  4.0 mmHg     Systemic Diam: 2.40 cm MV Vmax:       1.37 m/s MV Vmean:      92.7 cm/s MV Decel Time: 194 msec MV E velocity: 104.80 cm/s Kathlyn Sacramento MD Electronically signed by Kathlyn Sacramento MD Signature Date/Time: 03/04/2019/2:46:43 PM    Final      [place] RT UPPER EXTREM LTD SOFT TISSUE NON VASCULAR  Result Date: 03/08/2019 CLINICAL DATA:  Swelling and bruising of the right upper extremity. Possible hematoma. EXAM: ULTRASOUND right UPPER EXTREMITY LIMITED TECHNIQUE: Ultrasound examination of the upper extremity soft tissues  was performed in the area of clinical concern. COMPARISON:  None. FINDINGS: Diffuse subcutaneous soft tissue swelling/edema and streaky fluid. No discrete fluid collection or hematoma. The underlying musculature is grossly normal. IMPRESSION: Diffuse subcutaneous soft tissue swelling/edema/streaky fluid but no discrete abscess or hematoma. Electronically Signed   By: Marijo Sanes M.D.   On: 03/08/2019 19:13    Time Spent in minutes 35   Emelynn Rance M.D on 03/11/2019 at 12:42 PM  Between 7am to 7pm - Pager - [phone]  After 7pm go to [email or web address] - password [facility]  [facility] -  Office  [phone]

## 2019-03-11 NOTE — Progress Notes (Signed)
Nutrition Follow Up Note   DOCUMENTATION CODES:   Severe malnutrition in context of chronic illness  INTERVENTION:   Provide Ensure Max Protein po BID, each supplement provides 150 kcal and 30 grams of protein.  MVI daily   Rena-vite daily   Encouraged adequate intake at meals  NUTRITION DIAGNOSIS:   Severe Malnutrition related to chronic illness(stage IV lung adenocarcinoma, CHF, EtOH abuse) as evidenced by 11.9% weight loss over 5 months, severe fat depletion, moderate-severe muscle depletion.  GOAL:   Patient will meet greater than or equal to 90% of their needs  -progressing   MONITOR:   PO intake, Supplement acceptance, Labs, Weight trends, I & O's  ASSESSMENT:   64 year old male with PMHx of CAD s/p CABG 2004, HTN, DM, HLD, anxiety, arthritis, stage IV lung adenocarcinoma admitted with new onset A-fib with RVR, acute tubular necrosis, acute metabolic encephalopathy, NSTEMI, acute systolic CHF, DKA, EtOH abuse.   Pt with fair appetite and oral intake in hospital; pt eating 50% of his meals today and is drinking Ensure Max protein supplements. Palliative care following. Recommend continue supplements and MVI. Per chart, pt up ~12lbs since admit. Renal function has continued to decline; per MD note, plan is for HD. RD will add rena-vite daily.   Medications reviewed and include: aspirin, Marinol, ferrous sulfate, insulin, MVI, nicotine, miralax, Na Bicarbonate, heparin, thiamine   Labs reviewed: Na 130(L), BUN 109(H), creat 6.41(H), P 5.0(H), Mg 1.9(L) Hgb 8.0(L), Hct 22.7(L), MCV 77.2(L) cbgs- 196, 131, 142 x 24 hrs AIC 11.3(H)- 3/1  Diet Order:   Diet Order            DIET - DYS 1 Room service appropriate? Yes with Assist; Fluid consistency: Thin  Diet effective now             EDUCATION NEEDS:   No education needs have been identified at this time  Skin:  Skin Assessment: Reviewed RN Assessment  Last BM:  3/8- TYPE 6  Height:   Ht Readings from Last  1 Encounters:  03/03/19 6' (1.829 m)   Weight:   Wt Readings from Last 1 Encounters:  03/11/19 92 kg   Ideal Body Weight:  80.9 kg  BMI:  Body mass index is 27.51 kg/m.  Estimated Nutritional Needs:   Kcal:  7544-9201  Protein:  100 grams  Fluid:  per MD  Koleen Distance MS, RD, LDN Contact information available in Amion

## 2019-03-11 NOTE — Care Management Important Message (Signed)
Important Message  Patient Details  Name: Craig Koch MRN: [id number] Date of Birth: 11-26-1955   Medicare Important Message Given:  Yes     [name] 03/11/2019, 11:29 AM

## 2019-03-11 NOTE — Progress Notes (Signed)
Post HD    03/11/19 1810  Vital Signs  Temp 97.6 F (36.4 C)  Temp Source Oral  Pulse Rate 74  Pulse Rate Source Dinamap  Resp 18  BP (!) 107/53  BP Location Left Arm  BP Method Automatic  Patient Position (if appropriate) Lying  Oxygen Therapy  SpO2 100 %  O2 Device Nasal Cannula  O2 Flow Rate (L/min) 2 L/min  Post-Hemodialysis Assessment  Rinseback Volume (mL) 250 mL  KECN 18.2 V  Dialyzer Clearance Lightly streaked  Duration of HD Treatment -hour(s) 2 hour(s)  Hemodialysis Intake (mL) 500 mL  UF Total -Machine (mL) 500 mL  Net UF (mL) 0 mL  Tolerated HD Treatment Yes  AVG/AVF Arterial Site Held (minutes)  (n/a)  AVG/AVF Venous Site Held (minutes)  (n/a)  Education / Care Plan  Dialysis Education Provided Yes  Documented Education in Care Plan Yes  Outpatient Plan of Care Reviewed and on Chart Yes  Hemodialysis Catheter Right Femoral vein Triple lumen Temporary (Non-Tunneled)  Placement Date/Time: 03/11/19 1532   Time Out: Correct patient;Correct site;Correct procedure  Maximum sterile barrier precautions: Hand hygiene;Cap;Mask;Sterile gown;Sterile gloves;Large sterile sheet  Site Prep: Chlorhexidine (preferred)  Local Anes...  Site Condition No complications  Blue Lumen Status Heparin locked  Red Lumen Status Heparin locked  Purple Lumen Status N/A  Catheter fill solution Heparin 1000 units/ml  Catheter fill volume (Arterial) 1.8 cc  Catheter fill volume (Venous) 1.8  Dressing Type Biopatch;Occlusive  Dressing Status Clean;Dry;Intact  Interventions  (none required)  Drainage Description None  Dressing Change Due 03/17/19  Post treatment catheter status Capped and Clamped

## 2019-03-11 NOTE — Progress Notes (Signed)
HD Tx started w/o complication.    03/11/19 1603  Vital Signs  Pulse Rate 71  Resp (!) 21  BP (!) 101/52  BP Location Left Arm  BP Method Automatic  Patient Position (if appropriate) Lying  During Hemodialysis Assessment  Blood Flow Rate (mL/min) 150 mL/min  Arterial Pressure (mmHg) -80 mmHg  Venous Pressure (mmHg) 50 mmHg  Transmembrane Pressure (mmHg) 30 mmHg  Ultrafiltration Rate (mL/min) 330 mL/min  Dialysate Flow Rate (mL/min) 300 ml/min  Conductivity: Machine  14.1  HD Safety Checks Performed Yes  Dialysis Fluid Bolus Normal Saline  Bolus Amount (mL) 250 mL  Intra-Hemodialysis Comments Tx initiated

## 2019-03-11 NOTE — Consult Note (Signed)
[place] for Heparin  Indication: atrial fibrillation- new onset  Patient Measurements: Height: 6' (182.9 cm) Weight: 202 lb 13.2 oz (92 kg) IBW/kg (Calculated) : 77.6 Heparin Dosing Weight: 86.2 kg   Vital Signs: Temp: 98.6 F (37 C) (03/08 0455) Temp Source: Oral (03/08 0455) BP: 103/88 (03/08 0455) Pulse Rate: 65 (03/08 0455)  Labs: Recent Labs    03/09/19 0356 03/09/19 0727 03/10/19 0319 03/10/19 0319 03/10/19 1350 03/10/19 2200 03/11/19 0643  HGB 9.0*   < > 8.0*  --   --   --  8.0*  HCT 26.7*  --  23.1*  --   --   --  22.7*  PLT 237  --  244  --   --   --  277  HEPARINUNFRC  --    < > 3.44*   < > 0.45 0.46 0.70  CREATININE 5.94*  --  6.09*  --   --   --   --    < > = values in this interval not displayed.    Estimated Creatinine Clearance: 13.6 mL/min (A) (by C-G formula based on SCr of 6.09 mg/dL (H)).  Medications:  Called RN to confirm whether pt has been on [facility] prior to admission. Nurse was not able to confirm as she was not in the patient's room. Per chart review, no AC prior to admission and confirmed with pharmacy technician who spoke to patient.   Heparin Course: 03/03 0152 HL 0.23: inc to 1950 units/hr 03/03 1449 HL 0.32: no change 03/04 0633 HL <0.10: inc to 2200 units/hr 03/04 1856 HL 0.14: 2000 unit bolus, incto 2400 units/hr.  03/05 0707 HL <0.10: 2500 units bolus, inc to 2600 units/hr 03/05 1848 HL 1.26 held Heparin for 1 hour and decrease heparin drip to 2400 units/hr. 03/06 0727 HL 0.96 Will decrease heparin to 2250 units/hr.  3/6 1709 HL 1.33  3/7 0319 HL 3.44  3/7 1350 HL 0.45  3/7 2200 HL 0.46 3/8 0643 HL: 0.70  Assessment: Craig Koch is a 64 y.o. male with a past medical history of anxiety, CAD, diabetes, and hyperlipidemia, hypertension, lung cancer, presents to the emergency department for shortness of breath. Pharmacy was consulted for heparin dosing in a patient with new onset atrial  fibrillation. H&H trending down slightly, platelets are stable   Goal of Therapy:  Heparin level 0.3-0.7 units/ml Monitor platelets by anticoagulation protocol: Yes   Plan:  -Heparin level is at upper range of therapeutic. Will decrease heparin drip to 1650 units/hr.  -Recheck HL 8 hours after rate change.  -CBC daily and monitor for s/sx bleeding complications. Hgb is trending down, potentially due to worsening renal function and IDA?   Antithrombin III level resulted lower than normal limits.   Pharmacy will continue to monitor and adjust per consult.   [place] Resident 03/11/2019 7:15 AM

## 2019-03-11 NOTE — Progress Notes (Signed)
[facility], [place] 03/11/19  Subjective:  Renal function continues to deteriorate. Creatinine up to 6.4 and EGFR down to 8. Case discussed with patient and his wife. We have agreed to proceed with renal placement therapy.   Objective:  Vital signs in last 24 hours:  Temp:  [97.5 F (36.4 C)-98.6 F (37 C)] 97.5 F (36.4 C) (03/08 1121) Pulse Rate:  [64-68] 68 (03/08 1121) Resp:  [18-20] 18 (03/08 1121) BP: (97-126)/(49-88) 97/49 (03/08 1121) SpO2:  [90 %-100 %] 100 % (03/08 1121) Weight:  [92 kg] 92 kg (03/08 0455)  Weight change:  Filed Weights   03/08/19 1148 03/09/19 0437 03/11/19 0455  Weight: 86.5 kg 92.3 kg 92 kg    Intake/Output:    Intake/Output Summary (Last 24 hours) at 03/11/2019 1255 Last data filed at 03/11/2019 0629 Gross per 24 hour  Intake 1650.11 ml  Output 450 ml  Net 1200.11 ml     Physical Exam: General:  Ill-appearing, laying in bed  HEENT  moist oral mucous membranes, decreased hearing  Pulm/lungs  clear bilateral, normal effort  CVS/Heart  regular rhythm  Abdomen:   Soft, nontender  Extremities:  1+ pitting edema  Neurologic:  Awake, alert, conversant  Skin:  Warm, dry          Basic Metabolic Panel:  Recent Labs  Lab 03/05/19 0153 03/05/19 0153 03/06/19 0152 03/06/19 0152 03/07/19 0310 03/07/19 0310 03/08/19 0707 03/08/19 0707 03/09/19 0356 03/10/19 0319 03/11/19 0643  NA 128*   < > 129*   < > 130*  --  132*  --  132* 130* 130*  K 3.5   < > 3.3*   < > 3.3*  --  3.5  --  4.0 4.0 3.7  CL 102   < > 102   < > 101  --  105  --  105 96* 96*  CO2 15*   < > 14*   < > 16*  --  13*  --  15* 15* 25  GLUCOSE 311*   < > 182*   < > 144*  --  59*  --  90 155* 146*  BUN 65*   < > 75*   < > 84*  --  92*  --  98* 101* 109*  CREATININE 3.16*   < > 4.22*   < > 5.25*  --  5.94*  --  5.94* 6.09* 6.41*  CALCIUM 8.0*   < > 8.2*   < > 8.2*   < > 7.9*   < > 7.9* 7.5* 7.3*  MG 1.9  --  1.9  --   --   --   --   --   --    --   --   PHOS 1.4*  --   --   --   --   --   --   --   --  5.0*  --    < > = values in this interval not displayed.     CBC: Recent Labs  Lab 03/07/19 0310 03/08/19 0707 03/09/19 0356 03/10/19 0319 03/11/19 0643  WBC 6.3 7.9 7.7 6.3 7.1  HGB 10.2* 9.2* 9.0* 8.0* 8.0*  HCT 28.3* 26.2* 26.7* 23.1* 22.7*  MCV 75.7* 77.1* 81.4 78.0* 77.2*  PLT 244 256 237 244 277     No results found for: HEPBSAG, HEPBSAB, HEPBIGM    Microbiology:  Recent Results (from the past 240 hour(s))  Respiratory Panel by RT PCR (Flu A&B,  Covid) - Nasopharyngeal Swab     Status: None   Collection Time: 03/03/19  3:31 PM   Specimen: Nasopharyngeal Swab  Result Value Ref Range Status   SARS Coronavirus 2 by RT PCR NEGATIVE NEGATIVE Final    Comment: (NOTE) SARS-CoV-2 target nucleic acids are NOT DETECTED. The SARS-CoV-2 RNA is generally detectable in upper respiratoy specimens during the acute phase of infection. The lowest concentration of SARS-CoV-2 viral copies this assay can detect is 131 copies/mL. A negative result does not preclude SARS-Cov-2 infection and should not be used as the sole basis for treatment or other patient management decisions. A negative result may occur with  improper specimen collection/handling, submission of specimen other than nasopharyngeal swab, presence of viral mutation(s) within the areas targeted by this assay, and inadequate number of viral copies (<131 copies/mL). A negative result must be combined with clinical observations, patient history, and epidemiological information. The expected result is Negative. Fact Sheet for Patients:  PinkCheek.be Fact Sheet for Healthcare Providers:  GravelBags.it This test is not yet ap proved or cleared by the [place] FDA and  has been authorized for detection and/or diagnosis of SARS-CoV-2 by FDA under an Emergency Use Authorization (EUA). This EUA will remain   in effect (meaning this test can be used) for the duration of the COVID-19 declaration under Section 564(b)(1) of the Act, 21 U.S.C. section 360bbb-3(b)(1), unless the authorization is terminated or revoked sooner.    Influenza A by PCR NEGATIVE NEGATIVE Final   Influenza B by PCR NEGATIVE NEGATIVE Final    Comment: (NOTE) The Xpert Xpress SARS-CoV-2/FLU/RSV assay is intended as an aid in  the diagnosis of influenza from Nasopharyngeal swab specimens and  should not be used as a sole basis for treatment. Nasal washings and  aspirates are unacceptable for Xpert Xpress SARS-CoV-2/FLU/RSV  testing. Fact Sheet for Patients: PinkCheek.be Fact Sheet for Healthcare Providers: GravelBags.it This test is not yet approved or cleared by the [place] FDA and  has been authorized for detection and/or diagnosis of SARS-CoV-2 by  FDA under an Emergency Use Authorization (EUA). This EUA will remain  in effect (meaning this test can be used) for the duration of the  Covid-19 declaration under Section 564(b)(1) of the Act, 21  U.S.C. section 360bbb-3(b)(1), unless the authorization is  terminated or revoked. Performed at [facility], [address] [place], [place] [address]   MRSA PCR Screening     Status: None   Collection Time: 03/03/19  7:48 PM   Specimen: Nasopharyngeal  Result Value Ref Range Status   MRSA by PCR NEGATIVE NEGATIVE Final    Comment:        The GeneXpert MRSA Assay (FDA approved for NASAL specimens only), is one component of a comprehensive MRSA colonization surveillance program. It is not intended to diagnose MRSA infection nor to guide or monitor treatment for MRSA infections. Performed at [facility], [address] [place], [place] [address]   CULTURE, BLOOD (ROUTINE X 2) w Reflex to ID Panel     Status: None   Collection Time: 03/05/19 10:16 AM   Specimen: BLOOD  Result Value  Ref Range Status   Specimen Description BLOOD LEFT ANTECUBITAL  Final   Special Requests   Final    BOTTLES DRAWN AEROBIC AND ANAEROBIC Blood Culture adequate volume   Culture   Final    NO GROWTH 5 DAYS Performed at [facility], [address] [place], [place] [address]  Report Status 03/10/2019 FINAL  Final  CULTURE, BLOOD (ROUTINE X 2) w Reflex to ID Panel     Status: None   Collection Time: 03/05/19 10:22 AM   Specimen: BLOOD  Result Value Ref Range Status   Specimen Description BLOOD BLOOD LEFT HAND  Final   Special Requests   Final    BOTTLES DRAWN AEROBIC ONLY Blood Culture adequate volume   Culture   Final    NO GROWTH 5 DAYS Performed at [facility], [address], [place], [place] [address]    Report Status 03/10/2019 FINAL  Final    Coagulation Studies: No results for input(s): LABPROT, INR in the last 72 hours.  Urinalysis: No results for input(s): COLORURINE, LABSPEC, PHURINE, GLUCOSEU, HGBUR, BILIRUBINUR, KETONESUR, PROTEINUR, UROBILINOGEN, NITRITE, LEUKOCYTESUR in the last 72 hours.  Invalid input(s): APPERANCEUR    Imaging: US RENAL  Result Date: 03/10/2019 CLINICAL DATA:  64 year old presenting with acute renal failure. Evaluate for obstruction as a possible cause. EXAM: RENAL / URINARY TRACT ULTRASOUND COMPLETE COMPARISON:  Urinary tract ultrasound 03/04/2019. CT abdomen and pelvis 01/22/2019. FINDINGS: Right Kidney: Renal measurements: Approximately 12.6 x 6.4 x 6.2 cm = volume: 264 mL. Mildly echogenic parenchyma. No hydronephrosis. Well-preserved cortex. No shadowing calculi. No focal parenchymal abnormality. Left Kidney: Renal measurements: Approximately 12.5 x 6.3 x 6.9 cm = volume: 283 mL. Mildly echogenic parenchyma. No hydronephrosis. Well-preserved cortex. No shadowing calculi. No focal parenchymal abnormality. Bladder: Normal for degree of bladder distention. Other: None. IMPRESSION: 1. Mildly echogenic renal parenchyma  indicative of medical renal disease. 2. Otherwise normal examination. Specifically, no evidence of urinary tract obstruction to account for the acute renal insufficiency. Electronically Signed   By: Evangeline Dakin M.D.   On: 03/10/2019 15:02     Medications:   . heparin 1,650 Units/hr (03/11/19 1126)  . small volume/piggyback builder Stopped (03/11/19 1145)   . amiodarone  400 mg Oral BID  . aspirin EC  81 mg Oral Daily  . chlorhexidine  15 mL Mouth Rinse BID  . Chlorhexidine Gluconate Cloth  6 each Topical Q0600  . dextromethorphan-guaiFENesin  1 tablet Oral BID  . dronabinol  5 mg Oral BID AC  . DULoxetine  30 mg Oral Daily  . insulin aspart  0-15 Units Subcutaneous Q4H  . ipratropium-albuterol  3 mL Nebulization TID  . mouth rinse  15 mL Mouth Rinse q12n4p  . metoprolol tartrate  12.5 mg Oral BID  . multivitamin with minerals  1 tablet Oral Daily  . nicotine  21 mg Transdermal Daily  . polyethylene glycol  17 g Oral Daily  . Ensure Max Protein  11 oz Oral BID BM  . rosuvastatin  10 mg Oral Daily  . sodium bicarbonate  1,300 mg Oral BID   acetaminophen, albuterol, dextrose, menthol-cetylpyridinium, ondansetron (ZOFRAN) IV, pneumococcal 23 valent vaccine, sodium chloride  Assessment/ Plan:  64 y.o. male with hypertension, hyperlipidemia, diabetes, depression, anxiety, adenocarcinoma of the lung stage IV, coronary disease, history of CABG, tobacco and h/o alcohol abuse, left pleural effusion,  admitted on 03/03/2019 for DKA (diabetic ketoacidoses) ([place]) [E11.10] New onset atrial fibrillation ([place]) [I48.91] Atrial fibrillation with rapid ventricular response ([place]) [I48.91] Diabetic ketoacidosis without coma associated with other specified diabetes mellitus ([place]) [E13.10]     #Acute kidney injury, Metabolic acidosis Baseline creatinine of 0.7 on February 5.  Patient did have IV contrast exposure on January 19. Renal imaging: Kidney ultrasound March 1: Normal kidneys  Acute  kidney injury is likely secondary to  ATN caused by hypotension, hemodynamic instability.   Renal function continues to drop.  BUN up to 109 with a creatinine of 6.4 and EGFR down to 8.  Repeat renal ultrasound negative for hydronephrosis.  EGFR now down to 8 and urine output only 450 cc over the preceding 24 hours.  Therefore at this time we elected proceed with renal placement therapy after discussing risk, benefits, and alternatives with the patient and his wife.   #Hyponatremia Sodium remains low at 130.  This should come up with dialysis treatments.  #Diabetic ketoacidosis, poorly controlled diabetes Management as per ICU team Lab Results  Component Value Date   HGBA1C 11.3 (H) 03/04/2019   Multiple underlying chronic illnesses, poorly controlled diabetes, reported history of tobacco and h/o alcohol abuse, with underlying coronary disease, history of CABG, CHF with EF 30 to 35%, stage IV adenocarcinoma  # Hypokalemia Potassium currently 3.7.  Continue to monitor.    LOS: 8 Munsoor Lateef 3/8/202112:55 PM  [place], [place]  Note: This note was prepared with Dragon dictation. Any transcription errors are unintentional

## 2019-03-11 NOTE — Consult Note (Signed)
[place] for Heparin  Indication: atrial fibrillation- new onset  Patient Measurements: Height: 6' (182.9 cm) Weight: (UNABLE TO WEIGH PT) IBW/kg (Calculated) : 77.6 Heparin Dosing Weight: 86.2 kg   Vital Signs: Temp: 97.3 F (36.3 C) (03/08 2019) Temp Source: Oral (03/08 2019) BP: 107/50 (03/08 2019) Pulse Rate: 73 (03/08 2019)  Labs: Recent Labs    03/09/19 0356 03/09/19 0727 03/10/19 0319 03/10/19 1350 03/10/19 2200 03/11/19 0643 03/11/19 1949  HGB 9.0*   < > 8.0*  --   --  8.0*  --   HCT 26.7*  --  23.1*  --   --  22.7*  --   PLT 237  --  244  --   --  277  --   HEPARINUNFRC  --    < > 3.44*   < > 0.46 0.70 0.51  CREATININE 5.94*  --  6.09*  --   --  6.41*  --    < > = values in this interval not displayed.    Estimated Creatinine Clearance: 12.9 mL/min (A) (by C-G formula based on SCr of 6.41 mg/dL (H)).  Medications:  Called RN to confirm whether pt has been on [facility] prior to admission. Nurse was not able to confirm as she was not in the patient's room. Per chart review, no AC prior to admission and confirmed with pharmacy technician who spoke to patient.   Heparin Course: 03/03 0152 HL 0.23: inc to 1950 units/hr 03/03 1449 HL 0.32: no change 03/04 0633 HL <0.10: inc to 2200 units/hr 03/04 1856 HL 0.14: 2000 unit bolus, incto 2400 units/hr.  03/05 0707 HL <0.10: 2500 units bolus, inc to 2600 units/hr 03/05 1848 HL 1.26 held Heparin for 1 hour and decrease heparin drip to 2400 units/hr. 03/06 0727 HL 0.96 Will decrease heparin to 2250 units/hr.  3/6 1709 HL 1.33  3/7 0319 HL 3.44  3/7 1350 HL 0.45  3/7 2200 HL 0.46 3/8 0643 HL: 0.70 3/8 2025 HL 0.5. Therapeutic. S/p reduced infusion.   Assessment: Craig Koch is a 64 y.o. male with a past medical history of anxiety, CAD, diabetes, and hyperlipidemia, hypertension, lung cancer, presents to the emergency department for shortness of breath. Pharmacy was consulted for  heparin dosing in a patient with new onset atrial fibrillation. H&H trending down slightly, platelets are stable   Goal of Therapy:  Heparin level 0.3-0.7 units/ml Monitor platelets by anticoagulation protocol: Yes   Plan:  - Will continue heparin infusion rate of 1650 units/hr.  -Recheck HL 8 hours. CBC with AM labs.    Pharmacy will continue to monitor and adjust per consult.   [place] Resident 03/11/2019 8:26 PM

## 2019-03-12 DIAGNOSIS — N179 Acute kidney failure, unspecified: Secondary | ICD-10-CM

## 2019-03-12 DIAGNOSIS — D649 Anemia, unspecified: Secondary | ICD-10-CM

## 2019-03-12 DIAGNOSIS — Z515 Encounter for palliative care: Secondary | ICD-10-CM

## 2019-03-12 DIAGNOSIS — I480 Paroxysmal atrial fibrillation: Secondary | ICD-10-CM

## 2019-03-12 LAB — C4 COMPLEMENT: Complement C4, Body Fluid: 29 mg/dL (ref 12–38)

## 2019-03-12 LAB — CBC
HCT: 22.9 % — ABNORMAL LOW (ref 39.0–52.0)
Hemoglobin: 7.7 g/dL — ABNORMAL LOW (ref 13.0–17.0)
MCH: 26.9 pg (ref 26.0–34.0)
MCHC: 33.6 g/dL (ref 30.0–36.0)
MCV: 80.1 fL (ref 80.0–100.0)
Platelets: 244 10*3/uL (ref 150–400)
RBC: 2.86 MIL/uL — ABNORMAL LOW (ref 4.22–5.81)
RDW: 19.3 % — ABNORMAL HIGH (ref 11.5–15.5)
WBC: 5.5 10*3/uL (ref 4.0–10.5)
nRBC: 0.4 % — ABNORMAL HIGH (ref 0.0–0.2)

## 2019-03-12 LAB — ANA W/REFLEX IF POSITIVE: Anti Nuclear Antibody (ANA): NEGATIVE

## 2019-03-12 LAB — ABO/RH: ABO/RH(D): O POS

## 2019-03-12 LAB — BASIC METABOLIC PANEL
Anion gap: 19 — ABNORMAL HIGH (ref 5–15)
BUN: 81 mg/dL — ABNORMAL HIGH (ref 8–23)
CO2: 21 mmol/L — ABNORMAL LOW (ref 22–32)
Calcium: 7.5 mg/dL — ABNORMAL LOW (ref 8.9–10.3)
Chloride: 95 mmol/L — ABNORMAL LOW (ref 98–111)
Creatinine, Ser: 5.29 mg/dL — ABNORMAL HIGH (ref 0.61–1.24)
GFR calc Af Amer: 12 mL/min — ABNORMAL LOW (ref >60)
GFR calc non Af Amer: 11 mL/min — ABNORMAL LOW (ref >60)
Glucose, Bld: 168 mg/dL — ABNORMAL HIGH (ref 70–99)
Potassium: 3.9 mmol/L (ref 3.5–5.1)
Sodium: 135 mmol/L (ref 135–145)

## 2019-03-12 LAB — RETIC PANEL
Immature Retic Fract: 21 % — ABNORMAL HIGH (ref 2.3–15.9)
RBC.: 2.95 MIL/uL — ABNORMAL LOW (ref 4.22–5.81)
Retic Count, Absolute: 31.3 10*3/uL (ref 19.0–186.0)
Retic Ct Pct: 1.1 % (ref 0.4–3.1)
Reticulocyte Hemoglobin: 32.8 pg (ref >27.9)

## 2019-03-12 LAB — GLUCOSE, CAPILLARY
Glucose-Capillary: 153 mg/dL — ABNORMAL HIGH (ref 70–99)
Glucose-Capillary: 119 mg/dL — ABNORMAL HIGH (ref 70–99)
Glucose-Capillary: 134 mg/dL — ABNORMAL HIGH (ref 70–99)
Glucose-Capillary: 184 mg/dL — ABNORMAL HIGH (ref 70–99)
Glucose-Capillary: 237 mg/dL — ABNORMAL HIGH (ref 70–99)

## 2019-03-12 LAB — PHOSPHORUS: Phosphorus: 4.7 mg/dL — ABNORMAL HIGH (ref 2.5–4.6)

## 2019-03-12 LAB — PARATHYROID HORMONE, INTACT (NO CA): PTH: 65 pg/mL (ref 15–65)

## 2019-03-12 LAB — PREPARE RBC (CROSSMATCH)

## 2019-03-12 LAB — MPO/PR-3 (ANCA) ANTIBODIES
ANCA Proteinase 3: <3.5 U/mL (ref 0.0–3.5)
Myeloperoxidase Abs: <9 U/mL (ref 0.0–9.0)

## 2019-03-12 LAB — HEPARIN LEVEL (UNFRACTIONATED): Heparin Unfractionated: 0.37 IU/mL (ref 0.30–0.70)

## 2019-03-12 LAB — C3 COMPLEMENT: C3 Complement: 111 mg/dL (ref 82–167)

## 2019-03-12 LAB — GLOMERULAR BASEMENT MEMBRANE ANTIBODIES: GBM Ab: 5 units (ref 0–20)

## 2019-03-12 MED ORDER — INSULIN ASPART 100 UNIT/ML ~~LOC~~ SOLN
0.0000 [IU] | Freq: Every day | SUBCUTANEOUS | Status: DC
  Administered 2019-03-12 – 2019-03-17 (×3): 2 [IU] via SUBCUTANEOUS
  Filled 2019-03-12 (×3): qty 1

## 2019-03-12 MED ORDER — HEPARIN SOD (PORK) LOCK FLUSH 100 UNIT/ML IV SOLN
500.0000 [IU] | Freq: Every day | INTRAVENOUS | Status: DC | PRN
  Filled 2019-03-12: qty 5

## 2019-03-12 MED ORDER — DIPHENHYDRAMINE HCL 25 MG PO CAPS
25.0000 mg | ORAL_CAPSULE | Freq: Once | ORAL | Status: AC
  Administered 2019-03-13: 25 mg via ORAL

## 2019-03-12 MED ORDER — SODIUM CHLORIDE 0.9% IV SOLUTION: 250.0000 mL | Freq: Once | INTRAVENOUS | Status: DC

## 2019-03-12 MED ORDER — INSULIN ASPART 100 UNIT/ML ~~LOC~~ SOLN
0.0000 [IU] | Freq: Three times a day (TID) | SUBCUTANEOUS | Status: DC
  Administered 2019-03-12: 2 [IU] via SUBCUTANEOUS
  Administered 2019-03-13: 3 [IU] via SUBCUTANEOUS
  Administered 2019-03-13 – 2019-03-17 (×6): 2 [IU] via SUBCUTANEOUS
  Administered 2019-03-17: 3 [IU] via SUBCUTANEOUS
  Administered 2019-03-18: 1 [IU] via SUBCUTANEOUS
  Administered 2019-03-19: 3 [IU] via SUBCUTANEOUS
  Administered 2019-03-19: 2 [IU] via SUBCUTANEOUS
  Filled 2019-03-12 (×13): qty 1

## 2019-03-12 MED ORDER — OXYMETAZOLINE HCL 0.05 % NA SOLN
1.0000 | Freq: Two times a day (BID) | NASAL | Status: DC
  Administered 2019-03-12 – 2019-03-18 (×9): 1 via NASAL
  Filled 2019-03-12: qty 15

## 2019-03-12 MED ORDER — HEPARIN SOD (PORK) LOCK FLUSH 100 UNIT/ML IV SOLN
250.0000 [IU] | INTRAVENOUS | Status: DC | PRN
  Filled 2019-03-12: qty 5

## 2019-03-12 MED ORDER — SODIUM CHLORIDE 0.9% FLUSH
3.0000 mL | INTRAVENOUS | Status: AC | PRN
  Administered 2019-03-16: 3 mL

## 2019-03-12 MED ORDER — ACETAMINOPHEN 325 MG PO TABS: 650.0000 mg | ORAL_TABLET | Freq: Once | ORAL | Status: DC

## 2019-03-12 MED ORDER — CALCIUM CARBONATE ANTACID 500 MG PO CHEW
1.0000 | CHEWABLE_TABLET | Freq: Once | ORAL | Status: AC
  Administered 2019-03-12: 200 mg via ORAL
  Filled 2019-03-12: qty 1

## 2019-03-12 MED ORDER — ACETAMINOPHEN 325 MG PO TABS
650.0000 mg | ORAL_TABLET | Freq: Once | ORAL | Status: AC
  Administered 2019-03-13: 650 mg via ORAL

## 2019-03-12 MED ORDER — SODIUM CHLORIDE 0.9% FLUSH: 10.0000 mL | INTRAVENOUS | Status: DC | PRN

## 2019-03-12 MED ORDER — DIPHENHYDRAMINE HCL 25 MG PO CAPS: 25.0000 mg | ORAL_CAPSULE | Freq: Once | ORAL | Status: DC

## 2019-03-12 NOTE — Progress Notes (Addendum)
Progress Note  Patient Name: Craig Koch Date of Encounter: 03/12/2019  Primary Cardiologist: Rockey Situ  Subjective   SOB about the same. No chest pain or palpitations. Swelling slightly improved. Remains in sinus rhythm. HGB low, though stable. Renal replacement therapy started 3/8 with slight improvement in renal function this morning. He is net + 10.1 L for the admission.   Inpatient Medications    Scheduled Meds: . amiodarone  400 mg Oral BID  . aspirin EC  81 mg Oral Daily  . chlorhexidine  15 mL Mouth Rinse BID  . Chlorhexidine Gluconate Cloth  6 each Topical Q0600  . dextromethorphan-guaiFENesin  1 tablet Oral BID  . dronabinol  5 mg Oral BID AC  . DULoxetine  30 mg Oral Daily  . ferrous sulfate  325 mg Oral BID WC  . insulin aspart  0-15 Units Subcutaneous Q4H  . ipratropium-albuterol  3 mL Nebulization TID  . mouth rinse  15 mL Mouth Rinse q12n4p  . metoprolol tartrate  12.5 mg Oral BID  . multivitamin  1 tablet Oral QHS  . multivitamin with minerals  1 tablet Oral Daily  . nicotine  21 mg Transdermal Daily  . polyethylene glycol  17 g Oral Daily  . Ensure Max Protein  11 oz Oral BID BM  . rosuvastatin  10 mg Oral Daily  . sodium bicarbonate  1,300 mg Oral BID   Continuous Infusions: . heparin 1,650 Units/hr (03/12/19 0300)  . small volume/piggyback builder Stopped (03/11/19 1145)   PRN Meds: acetaminophen, albuterol, dextrose, menthol-cetylpyridinium, ondansetron (ZOFRAN) IV, pneumococcal 23 valent vaccine, sodium chloride   Vital Signs    Vitals:   03/11/19 1810 03/11/19 1819 03/11/19 2019 03/12/19 0344  BP: (!) 107/53 (!) 104/46 (!) 107/50 (!) 101/47  Pulse: 74 75 73 70  Resp: 18 19 20 20   Temp: 97.6 F (36.4 C) 98 F (36.7 C) (!) 97.3 F (36.3 C) 98.3 F (36.8 C)  TempSrc: Oral Oral Oral Oral  SpO2: 100% 98% 99% 99%  Weight:    94.5 kg  Height:        Intake/Output Summary (Last 24 hours) at 03/12/2019 0719 Last data filed at 03/12/2019  0422 Gross per 24 hour  Intake 552.81 ml  Output 950 ml  Net -397.19 ml   Filed Weights   03/11/19 0455 03/11/19 1600 03/12/19 0344  Weight: 92 kg 92 kg 94.5 kg    Telemetry    SR with sinus arrhythmia and 1st degree AVB - Personally Reviewed  ECG    No new tracings - Personally Reviewed  Physical Exam   GEN: Chronically ill appearing; No acute distress.   Neck: No JVD. Cardiac: RRR, I/VI systolic murmur, no rubs, or gallops.  Respiratory: Diminished breath sounds bilaterally at the bases.  GI: Soft, nontender, non-distended.   MS: 1+ bilateral lower extremity and right upper extremity edema; No deformity. Neuro:  Alert and oriented x 3; Nonfocal.  Psych: Normal affect.  Labs    Chemistry Recent Labs  Lab 03/10/19 0319 03/11/19 0643 03/12/19 0432  NA 130* 130* 135  K 4.0 3.7 3.9  CL 96* 96* 95*  CO2 15* 25 21*  GLUCOSE 155* 146* 168*  BUN 101* 109* 81*  CREATININE 6.09* 6.41* 5.29*  CALCIUM 7.5* 7.3* 7.5*  ALBUMIN 1.9*  --   --   GFRNONAA 9* 8* 11*  GFRAA 10* 10* 12*  ANIONGAP 19* 9 19*     Hematology Recent Labs  Lab 03/10/19  0814 03/11/19 0643 03/12/19 0432  WBC 6.3 7.1 5.5  RBC 2.96* 2.94* 2.86*  HGB 8.0* 8.0* 7.7*  HCT 23.1* 22.7* 22.9*  MCV 78.0* 77.2* 80.1  MCH 27.0 27.2 26.9  MCHC 34.6 35.2 33.6  RDW 19.0* 18.5* 19.3*  PLT 244 277 244    Cardiac EnzymesNo results for input(s): TROPONINI in the last 168 hours. No results for input(s): TROPIPOC in the last 168 hours.   BNPNo results for input(s): BNP, PROBNP in the last 168 hours.   DDimer No results for input(s): DDIMER in the last 168 hours.   Radiology    US RENAL  Result Date: 03/10/2019 IMPRESSION: 1. Mildly echogenic renal parenchyma indicative of medical renal disease. 2. Otherwise normal examination. Specifically, no evidence of urinary tract obstruction to account for the acute renal insufficiency. Electronically Signed   By: Evangeline Dakin M.D.   On: 03/10/2019 15:02     Cardiac Studies   2D echo 03/04/2019: 1. Left ventricular ejection fraction, by estimation, is 30 to 35%. The  left ventricle has moderately decreased function. The left ventricle  demonstrates global hypokinesis. Left ventricular diastolic parameters are  indeterminate.  2. Right ventricular systolic function is normal. The right ventricular  size is normal. Tricuspid regurgitation signal is inadequate for assessing  PA pressure.  3. Left atrial size was mild to moderately dilated.  4. The mitral valve is normal in structure and function. Moderate mitral  valve regurgitation. No evidence of mitral stenosis.  5. The aortic valve is normal in structure and function. Aortic valve  regurgitation is trivial. Mild to moderate aortic valve  sclerosis/calcification is present, without any evidence of aortic  stenosis.  6. The inferior vena cava is normal in size with greater than 50%  respiratory variability, suggesting right atrial pressure of 3 mmHg.   Patient Profile     64 y.o. 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  Assessment & Plan    1. CAD s/p CABG with NSTEMI: -Markedly elevated high-sensitivity troponin greater than 27,000 in the setting of known CAD with A. fib with RVR and volume overload at presentation  -Remains on heparin drip as outlined below -Currently, not a good candidate for invasive ischemic evaluation given acute illness with severe comorbid conditions including acute renal failure -Consider addition of Plavix for medical management with possible discontinuation of aspirin prior to discharge if he is to be placed on [place] given his A. Fib if comorbid conditions allow -ASA -Crestor  2. PAF with RVR: -He remains in sinus rhythm, high risk for recurrent arrhythmia  -Blood pressures remain low -Continue p.o.  amiodarone 400 mg twice daily for 2 more days, then decrease to 200 mg daily for 1 week, then 200 mg daily thereafter -Continue low-dose metoprolol and heparin drip -CHADS2VASc at least 4 (CHF, HTN, DM, vascular disease) -Prior to discharge will need to evaluate his status for possible OAC given his comorbid conditions  2. HFrEF: -He remains volume overloaded, which may be contributing to his kidney disease -Started renal replacement therapy 3/8 -Continue Toprol-XL if SBP > 100 mmHg, with plans to add Imdur/hydralazine discharge pending kidney function -BP precludes Coreg -Renal function precludes addition of ACEi/ARB/spironolactone/Entresto -Currently, not a good candidate for invasive ischemic evaluation given his new onset cardiomyopathy as outlined above -CHF education -Strict I/O  3. ARF: -Unfortunately renal  function has continued to worsen -Bladder scan as above -Renal replacement therapy per nephrology  4. Stage IV lung cancer with malignant pleural effusions and COPD with prior tobacco use: -Oncology following -Complete tobacco cessation encouraged  5.  HLD: -Continue Crestor  6. Anemia: -HGB low, though stable -No indication for transfusion at this time -Continue close monitoring since he is on a heparin drip  For questions or updates, please contact [place] Please consult [email or web address] for contact info under Cardiology/STEMI.    Signed, Christell Faith, PA-C [place] Pager: [phone] 03/12/2019, 7:19 AM

## 2019-03-12 NOTE — Progress Notes (Signed)
Post HD Tx    03/12/19 1134  Hand-Off documentation  Report given to (Full Name) Burr Medico, RN   Report received from (Full Name) Beatris Ship, RN   Vital Signs  Temp 98.4 F (36.9 C)  Temp Source Oral  Pulse Rate 78  Pulse Rate Source Monitor  Resp 17  BP (!) 99/52  BP Location Left Arm  BP Method Automatic  Patient Position (if appropriate) Lying  Oxygen Therapy  SpO2 97 %  O2 Device Nasal Cannula  Pain Assessment  Pain Scale 0-10  Pain Score 0  Dialysis Weight  Weight 94.6 kg  Type of Weight Post-Dialysis  Time-Out for Hemodialysis  What Procedure? HD   Pt Identifiers(min of two) First/Last Name;MRN/Account#  Correct Site? Yes  Correct Side? Yes  Correct Procedure? Yes  Consents Verified? Yes  Rad Studies Available? N/A  Safety Precautions Reviewed? Yes  Engineer, civil (consulting) Number 2  Station Number 1  UF/Alarm Test Passed  Conductivity: Meter 14  Conductivity: Machine  14.1  pH 7.2  Reverse Osmosis Main  Normal Saline Lot Number [id number]  Dialyzer Lot Number [id number]  Disposable Set Lot Number [id number]  Machine Temperature 98.6 F (37 C)  Musician and Audible Yes  Blood Lines Intact and Secured Yes  Pre Treatment Patient Checks  Vascular access used during treatment Catheter  HD catheter dressing before treatment WDL  Hepatitis B Surface Antigen Results Negative  Date Hepatitis B Surface Antigen Drawn 03/11/19  Hemodialysis Consent Verified Yes  Hemodialysis Standing Orders Initiated Yes  ECG (Telemetry) Monitor On Yes  Prime Ordered Normal Saline  Length of  DialysisTreatment -hour(s) 2.5 Hour(s)  Dialysis Treatment Comments Na 140  Dialyzer Elisio 17H NR  Dialysate 3K;2.5 Ca  Dialysis Anticoagulant None  Dialysate Flow Ordered 500  Blood Flow Rate Ordered 250 mL/min  Ultrafiltration Goal 0 Liters  Dialysis Blood Pressure Support Ordered Normal Saline  Hemodialysis Catheter Right Femoral vein Triple lumen Temporary  (Non-Tunneled)  Placement Date/Time: 03/11/19 1532   Time Out: Correct patient;Correct site;Correct procedure  Maximum sterile barrier precautions: Hand hygiene;Cap;Mask;Sterile gown;Sterile gloves;Large sterile sheet  Site Prep: Chlorhexidine (preferred)  Local Anes...  Site Condition No complications  Blue Lumen Status Heparin locked  Red Lumen Status Heparin locked  Post treatment catheter status Capped and Clamped

## 2019-03-12 NOTE — Consult Note (Signed)
ANTICOAGULATION CONSULT NOTE  Pharmacy Consult for Heparin  Indication: atrial fibrillation- new onset  Patient Measurements: Height: 6' (182.9 cm) Weight: 208 lb 4.8 oz (94.5 kg) IBW/kg (Calculated) : 77.6 Heparin Dosing Weight: 86.2 kg   Vital Signs: Temp: 98.3 F (36.8 C) (03/09 0344) Temp Source: Oral (03/09 0344) BP: 101/47 (03/09 0344) Pulse Rate: 70 (03/09 0344)  Labs: Recent Labs    03/10/19 0319 03/10/19 1350 03/11/19 0643 03/11/19 1949 03/12/19 0432  HGB 8.0*  --  8.0*  --  7.7*  HCT 23.1*  --  22.7*  --  22.9*  PLT 244  --  277  --  244  HEPARINUNFRC 3.44*   < > 0.70 0.51 0.37  CREATININE 6.09*  --  6.41*  --  5.29*   < > = values in this interval not displayed.    Estimated Creatinine Clearance: 17.1 mL/min (A) (by C-G formula based on SCr of 5.29 mg/dL (H)).  Medications:  Called RN to confirm whether pt has been on [facility] prior to admission. Nurse was not able to confirm as she was not in the patient's room. Per chart review, no AC prior to admission and confirmed with pharmacy technician who spoke to patient.   Heparin Course: 03/03 0152 HL 0.23: inc to 1950 units/hr 03/03 1449 HL 0.32: no change 03/04 0633 HL <0.10: inc to 2200 units/hr 03/04 1856 HL 0.14: 2000 unit bolus, incto 2400 units/hr.  03/05 0707 HL <0.10: 2500 units bolus, inc to 2600 units/hr 03/05 1848 HL 1.26 held Heparin for 1 hour and decrease heparin drip to 2400 units/hr. 03/06 0727 HL 0.96 Will decrease heparin to 2250 units/hr.  3/6 1709 HL 1.33  3/7 0319 HL 3.44  3/7 1350 HL 0.45  3/7 2200 HL 0.46 3/8 0643 HL: 0.70 3/8 2025 HL 0.5. Therapeutic. S/p reduced infusion.   Assessment: Craig Koch is a 64 y.o. male with a past medical history of anxiety, CAD, diabetes, and hyperlipidemia, hypertension, lung cancer, presents to the emergency department for shortness of breath. Pharmacy was consulted for heparin dosing in a patient with new onset atrial fibrillation. H&H trending down  slightly, platelets are stable   Goal of Therapy:  Heparin level 0.3-0.7 units/ml Monitor platelets by anticoagulation protocol: Yes   Plan:  03/09 @ 0430 HL 0.37 therapeutic. Will continue current rate and will recheck HL w/ am labs, hgb trending down will continue to monitor.  Tobie Lords, PharmD, BCPS Clinical Pharmacist 03/12/2019 6:40 AM

## 2019-03-12 NOTE — Progress Notes (Signed)
Hematology/Oncology Progress Note [facility] Telephone:([phone] [phone]  Patient Care Team: Tonia Ghent, MD as PCP - General (Family Medicine) Thelma Comp, [place] (Optometry) Telford Nab, RN as Registered Nurse   Name of the patient: Craig Koch  [id number]  [birth year]  Date of visit: 03/12/19   INTERVAL HISTORY-  Wife is at bedside today.  Patient reports feeling weak.  Right upper extremity pain is getting better.    Review of systems- Review of Systems  Constitutional: Positive for fatigue. Negative for appetite change, chills, fever and unexpected weight change.  HENT:   Negative for hearing loss and voice change.   Eyes: Negative for eye problems and icterus.  Respiratory: Positive for shortness of breath. Negative for chest tightness and cough.   Cardiovascular: Negative for chest pain and leg swelling.  Gastrointestinal: Negative for abdominal distention and abdominal pain.  Endocrine: Negative for hot flashes.  Genitourinary: Negative for difficulty urinating, dysuria and frequency.   Musculoskeletal: Negative for arthralgias.       Right arm pain  Skin: Negative for itching and rash.  Neurological: Negative for light-headedness and numbness.  Hematological: Negative for adenopathy. Does not bruise/bleed easily.  Psychiatric/Behavioral: Negative for confusion.    Allergies  Allergen Reactions  . Lipitor [Atorvastatin Calcium] Other (See Comments)    Aches.  Tolerated crestor.     Patient Active Problem List   Diagnosis Date Noted  . Acute systolic heart failure ([place])   . Pain of right upper extremity   . Microcytic anemia   . Protein-calorie malnutrition, severe 03/06/2019  . Acute alteration in mental status   . Palliative care encounter   . Atrial fibrillation with RVR ([place]) 03/03/2019  . DKA (diabetic ketoacidoses) ([place]) 03/03/2019  . Diabetes mellitus without complication ([place]) 48/54/6270  .  Hypothermia 03/03/2019  . SOB (shortness of breath) 03/03/2019  . AKI (acute kidney injury) ([place]) 03/03/2019  . NSTEMI (non-ST elevated myocardial infarction) ([place]) 03/03/2019  . Hyponatremia 02/08/2019  . Uncontrolled type 2 diabetes mellitus with hyperglycemia ([place]) 02/08/2019  . Leg swelling 02/08/2019  . Paronychia, finger 12/19/2018  . Encounter for antineoplastic chemotherapy 12/08/2018  . Tobacco abuse 10/12/2018  . Malignant pleural effusion   . Administrative encounter 09/23/2018  . lung adenocarcinoma 09/08/2018  . Goals of care, counseling/discussion 09/08/2018  . Recurrent pleural effusion on left 09/02/2018  . Elevated troponin 08/24/2018  . Medicare annual wellness visit, subsequent 08/07/2018  . Lower urinary tract symptoms (LUTS) 08/07/2018  . Neck pain 08/07/2018  . CAD (coronary artery disease), native coronary artery 09/23/2017  . Smoker 09/23/2017  . Chronic joint pain 10/18/2016  . Healthcare maintenance 07/17/2016  . Advance care planning 07/17/2016  . Fungal rash of torso 05/06/2016  . Trigger finger 05/06/2016  . History of alcohol use 11/17/2015  . Skin lesion 05/19/2015  . Anxiety state 02/19/2015  . Hand weakness 05/23/2011  . Diabetes mellitus with complication ([place]) 35/00/9381  . HLD (hyperlipidemia) 03/05/2010  . Essential hypertension 03/05/2010  . Coronary atherosclerosis 03/05/2010     Past Medical History:  Diagnosis Date  . Anxiety   . Arthritis   . Coronary artery disease   . Diabetes mellitus   . Dyslipidemia   . Hx of CABG   . Hyperlipidemia   . Hypertension   . Malignant neoplasm of unspecified part of unspecified bronchus or lung ([place]) 08/2018   Immunotherapy  . Pleural effusion      Past Surgical History:  Procedure Laterality Date  .  CATARACT EXTRACTION Left 09/2015  . CHEST TUBE INSERTION Left 10/01/2018   Procedure: INSERTION PLEURAL DRAINAGE CATHETER;  Surgeon: [name], MD;  Location: [facility] ORS;  Service:  Thoracic;  Laterality: Left;  . CORONARY ARTERY BYPASS GRAFT  2004   (CABG with LIMA to the  LAD, SVG to OM2/OM3, SVG  to diag  . Left ankle surgery     repair of fracture  . Right lower leg surgery     rod  . TEMPORARY DIALYSIS CATHETER N/A 03/11/2019   Procedure: TEMPORARY DIALYSIS CATHETER;  Surgeon: Algernon Huxley, MD;  Location: [facility];  Service: Cardiovascular;  Laterality: N/A;    Social History   Socioeconomic History  . Marital status: Married    Spouse name: Not on file  . Number of children: Not on file  . Years of education: Not on file  . Highest education level: Not on file  Occupational History  . Not on file  Tobacco Use  . Smoking status: Current Every Day Smoker    Packs/day: 0.20    Years: 45.00    Pack years: 9.00    Types: Cigarettes  . Smokeless tobacco: Former Systems developer    Types: Snuff  Substance and Sexual Activity  . Alcohol use: Yes    Alcohol/week: 6.0 standard drinks    Types: 6 Cans of beer per week    Comment: occ, average 6 pack in a week  . Drug use: No  . Sexual activity: Not Currently  Other Topics Concern  . Not on file  Social History Narrative   On disability 2009 after prev injuries and CAD.     Married 1976   2 kids, 4 grandkids.    Social Determinants of Health   Financial Resource Strain:   . Difficulty of Paying Living Expenses: Not on file  Food Insecurity:   . Worried About Charity fundraiser in the Last Year: Not on file  . Ran Out of Food in the Last Year: Not on file  Transportation Needs:   . Lack of Transportation (Medical): Not on file  . Lack of Transportation (Non-Medical): Not on file  Physical Activity:   . Days of Exercise per Week: Not on file  . Minutes of Exercise per Session: Not on file  Stress:   . Feeling of Stress : Not on file  Social Connections:   . Frequency of Communication with Friends and Family: Not on file  . Frequency of Social Gatherings with Friends and Family: Not on file  .  Attends Religious Services: Not on file  . Active Member of Clubs or Organizations: Not on file  . Attends Archivist Meetings: Not on file  . Marital Status: Not on file  Intimate Partner Violence:   . Fear of Current or Ex-Partner: Not on file  . Emotionally Abused: Not on file  . Physically Abused: Not on file  . Sexually Abused: Not on file     Family History  Problem Relation Age of Onset  . Dementia Mother   . Heart disease Father   . Colon cancer Neg Hx   . Prostate cancer Neg Hx   . Diabetes Neg Hx      Current Facility-Administered Medications:  .  acetaminophen (TYLENOL) tablet 650 mg, 650 mg, Oral, Q6H PRN, Lucky Cowboy, Erskine Squibb, MD .  albuterol (PROVENTIL) (2.5 MG/3ML) 0.083% nebulizer solution 3 mL, 3 mL, Inhalation, Q4H PRN, Lucky Cowboy, Erskine Squibb, MD .  amiodarone (PACERONE) tablet  400 mg, 400 mg, Oral, BID, Dew, Erskine Squibb, MD, 400 mg at 03/12/19 1322 .  aspirin EC tablet 81 mg, 81 mg, Oral, Daily, Algernon Huxley, MD, 81 mg at 03/12/19 1254 .  chlorhexidine (PERIDEX) 0.12 % solution 15 mL, 15 mL, Mouth Rinse, BID, Dew, Erskine Squibb, MD, 15 mL at 03/12/19 1322 .  Chlorhexidine Gluconate Cloth 2 % PADS 6 each, 6 each, Topical, Q0600, Algernon Huxley, MD, 6 each at 03/12/19 0428 .  dextromethorphan-guaiFENesin (MUCINEX DM) 30-600 MG per 12 hr tablet 1 tablet, 1 tablet, Oral, BID, Dew, Erskine Squibb, MD, 1 tablet at 03/12/19 1322 .  dextrose 50 % solution 0-50 mL, 0-50 mL, Intravenous, PRN, Algernon Huxley, MD, 25 mL at 03/08/19 0355 .  dronabinol (MARINOL) capsule 5 mg, 5 mg, Oral, BID AC, Dew, Erskine Squibb, MD, 5 mg at 03/12/19 1255 .  DULoxetine (CYMBALTA) DR capsule 30 mg, 30 mg, Oral, Daily, Dew, Erskine Squibb, MD, 30 mg at 03/12/19 1254 .  ferrous sulfate tablet 325 mg, 325 mg, Oral, BID WC, Dew, Erskine Squibb, MD, 325 mg at 03/12/19 1254 .  heparin ADULT infusion 100 units/mL (25000 units/269m sodium chloride 0.45%), 1,650 Units/hr, Intravenous, Continuous, Dew, JErskine Squibb MD, Last Rate: 16.5 mL/hr at 03/12/19  1310, 1,650 Units/hr at 03/12/19 1310 .  insulin aspart (novoLOG) injection 0-5 Units, 0-5 Units, Subcutaneous, QHS, Dhungel, Nishant, MD .  insulin aspart (novoLOG) injection 0-9 Units, 0-9 Units, Subcutaneous, TID WC, Dhungel, Nishant, MD .  ipratropium-albuterol (DUONEB) 0.5-2.5 (3) MG/3ML nebulizer solution 3 mL, 3 mL, Nebulization, TID, Dew, JErskine Squibb MD, 3 mL at 03/12/19 1348 .  MEDLINE mouth rinse, 15 mL, Mouth Rinse, q12n4p, Dew, JErskine Squibb MD, 15 mL at 03/12/19 1454 .  menthol-cetylpyridinium (CEPACOL) lozenge 3 mg, 1 lozenge, Oral, PRN, DLucky Cowboy JErskine Squibb MD .  metoprolol tartrate (LOPRESSOR) tablet 12.5 mg, 12.5 mg, Oral, BID, Dew, JErskine Squibb MD, 12.5 mg at 03/11/19 2302 .  multivitamin (RENA-VIT) tablet 1 tablet, 1 tablet, Oral, QHS, Dew, JErskine Squibb MD, 1 tablet at 03/11/19 2304 .  multivitamin with minerals tablet 1 tablet, 1 tablet, Oral, Daily, Dew, JErskine Squibb MD, 1 tablet at 03/12/19 1255 .  nicotine (NICODERM CQ - dosed in mg/24 hours) patch 21 mg, 21 mg, Transdermal, Daily, Dew, JErskine Squibb MD, 21 mg at 03/12/19 1300 .  ondansetron (ZOFRAN) injection 4 mg, 4 mg, Intravenous, Q8H PRN, Dew, JErskine Squibb MD .  pneumococcal 23 valent vaccine (PNEUMOVAX-23) injection 0.5 mL, 0.5 mL, Intramuscular, Prior to discharge, Sreenath, Sudheer B, MD .  polyethylene glycol (MIRALAX / GLYCOLAX) packet 17 g, 17 g, Oral, Daily, Dew, JErskine Squibb MD, 17 g at 03/09/19 1014 .  protein supplement (ENSURE MAX) liquid, 11 oz, Oral, BID BM, DAlgernon Huxley MD, 11 oz at 03/12/19 1259 .  rosuvastatin (CRESTOR) tablet 10 mg, 10 mg, Oral, Daily, Dew, JErskine Squibb MD, 10 mg at 03/12/19 1255 .  sodium bicarbonate tablet 1,300 mg, 1,300 mg, Oral, BID, Dew, JErskine Squibb MD, 1,300 mg at 03/12/19 1310 .  sodium chloride (OCEAN) 0.65 % nasal spray 1 spray, 1 spray, Each Nare, PRN, DAlgernon Huxley MD, 1 spray at 03/11/19 0503 .  thiamine (B-1) 1676mg, folic acid 1 mg in sodium chloride 0.9 % 50 mL injection, , Intravenous, Q24H, Dew, JErskine Squibb MD, Last  Rate: 50 mL/hr at 03/12/19 1256, New Bag at 03/12/19 1256   Physical exam:  Vitals:   03/12/19 1130 03/12/19 1134 03/12/19 1218 03/12/19 1348  BP: (!) 96/47 (!) 99/52 (!) 100/53   Pulse: 78 78 79   Resp: (!) '24 17 18   ' Temp: 98.4 F (36.9 C) 98.4 F (36.9 C) 98 F (36.7 C)   TempSrc: Oral Oral Oral   SpO2: 97% 97% 97% 97%  Weight:  208 lb 8.9 oz (94.6 kg)    Height:       Physical Exam  Constitutional: He is oriented to person, place, and time. No distress.  HENT:  Head: Normocephalic and atraumatic.  Eyes: Pupils are equal, round, and reactive to light. EOM are normal. No scleral icterus.  Cardiovascular: Normal rate and regular rhythm.  No murmur heard. Pulmonary/Chest: Effort normal. No respiratory distress. He has no rales. He exhibits no tenderness.  Abdominal: Soft.  Musculoskeletal:        General: No edema. Normal range of motion.     Cervical back: Normal range of motion and neck supple.     Comments: Right upper arm tenderness  Neurological: He is alert and oriented to person, place, and time.  Skin: Skin is warm and dry. He is not diaphoretic. No erythema.  Ecchymosis and bruising on right upper extremity.  Psychiatric: Affect normal.       CMP Latest Ref Rng & Units 03/12/2019  Glucose 70 - 99 mg/dL 168(H)  BUN 8 - 23 mg/dL 81(H)  Creatinine 0.61 - 1.24 mg/dL 5.29(H)  Sodium 135 - 145 mmol/L 135  Potassium 3.5 - 5.1 mmol/L 3.9  Chloride 98 - 111 mmol/L 95(L)  CO2 22 - 32 mmol/L 21(L)  Calcium 8.9 - 10.3 mg/dL 7.5(L)  Total Protein 6.5 - 8.1 g/dL -  Total Bilirubin 0.3 - 1.2 mg/dL -  Alkaline Phos 38 - 126 U/L -  AST 15 - 41 U/L -  ALT 0 - 44 U/L -   CBC Latest Ref Rng & Units 03/12/2019  WBC 4.0 - 10.5 K/uL 5.5  Hemoglobin 13.0 - 17.0 g/dL 7.7(L)  Hematocrit 39.0 - 52.0 % 22.9(L)  Platelets 150 - 400 K/uL 244    RADIOGRAPHIC STUDIES: I have personally reviewed the radiological images as listed and agreed with the findings in the report. CT HEAD  WO CONTRAST  Result Date: 03/05/2019 CLINICAL DATA:  Metastatic disease evaluation.  Lung cancer. EXAM: CT HEAD WITHOUT CONTRAST TECHNIQUE: Contiguous axial images were obtained from the base of the skull through the vertex without intravenous contrast. COMPARISON:  MRI head 09/20/2018 FINDINGS: Brain: Mild atrophy. Mild hypodensity in the white matter unchanged from the prior MRI. No acute infarct, hemorrhage, mass. No edema or midline shift. Vascular: Negative for hyperdense vessel Skull: Negative skull. Sinuses/Orbits: Mucosal edema and bony thickening right maxillary sinus. Chronic nasal bone fracture. Left cataract extraction. Other: None IMPRESSION: No acute abnormality and negative for metastatic disease on unenhanced CT. Atrophy and mild chronic microvascular ischemia. Electronically Signed   By: Franchot Gallo M.D.   On: 03/05/2019 15:28   US RENAL  Result Date: 03/10/2019 CLINICAL DATA:  64 year old presenting with acute renal failure. Evaluate for obstruction as a possible cause. EXAM: RENAL / URINARY TRACT ULTRASOUND COMPLETE COMPARISON:  Urinary tract ultrasound 03/04/2019. CT abdomen and pelvis 01/22/2019. FINDINGS: Right Kidney: Renal measurements: Approximately 12.6 x 6.4 x 6.2 cm = volume: 264 mL. Mildly echogenic parenchyma. No hydronephrosis. Well-preserved cortex. No shadowing calculi. No focal parenchymal abnormality. Left Kidney: Renal measurements: Approximately 12.5 x 6.3 x 6.9 cm = volume: 283 mL. Mildly echogenic parenchyma. No hydronephrosis. Well-preserved cortex. No shadowing calculi. No  focal parenchymal abnormality. Bladder: Normal for degree of bladder distention. Other: None. IMPRESSION: 1. Mildly echogenic renal parenchyma indicative of medical renal disease. 2. Otherwise normal examination. Specifically, no evidence of urinary tract obstruction to account for the acute renal insufficiency. Electronically Signed   By: Evangeline Dakin M.D.   On: 03/10/2019 15:02   US  RENAL  Result Date: 03/04/2019 CLINICAL DATA:  Acute kidney injury EXAM: RENAL / URINARY TRACT ULTRASOUND COMPLETE COMPARISON:  CT 01/22/2019 FINDINGS: Right Kidney: Renal measurements: 11.4 x 6 x 6 cm = volume: 215 mL . Echogenicity within normal limits. No mass or hydronephrosis visualized. Left Kidney: Renal measurements: 11.8 x 7.3 x 6.5 cm = volume: 291.8 mL. Echogenicity within normal limits. No mass or hydronephrosis visualized. Bladder: Appears normal for degree of bladder distention. Other: Incidental note made of gallstones and small effusions. Heterogenous prostate. IMPRESSION: Normal ultrasound appearance of the kidneys Electronically Signed   By: Donavan Foil M.D.   On: 03/04/2019 15:37   PERIPHERAL VASCULAR CATHETERIZATION  Result Date: 03/11/2019 See op note  DG Chest Portable 1 View  Result Date: 03/03/2019 CLINICAL DATA:  Shortness of breath, since Monday worsening this morning around 1 a.m. EXAM: PORTABLE CHEST 1 VIEW COMPARISON:  12/19/2018 FINDINGS: Cardiomediastinal contours are stable following median sternotomy with persistent left-sided pleural effusion and pleural-parenchymal scarring. No new areas of consolidation or evidence of right-sided pleural effusion. No signs of acute bone process. IMPRESSION: Persistent left-sided pleural effusion and pleural-parenchymal scarring. Electronically Signed   By: Zetta Bills M.D.   On: 03/03/2019 13:59   ECHOCARDIOGRAM COMPLETE  Result Date: 03/04/2019    ECHOCARDIOGRAM REPORT   Patient Name:   NICKOLAUS BORDELON Date of Exam: 03/04/2019 Medical Rec #:  [id number]       Height:       72.0 in Accession #:    [id number]      Weight:       185.0 lb Date of Birth:  1955-12-08       BSA:          2.061 m Patient Age:    43 years        BP:           125/72 mmHg Patient Gender: M               HR:           119 bpm. Exam Location:  [facility] Procedure: 2D Echo, Color Doppler and Cardiac Doppler Indications:     I21.4 NSTEMI  History:         Patient has  prior history of Echocardiogram examinations, most                  recent 08/24/2018. CAD, Prior CABG; Risk Factors:Hypertension,                  Dyslipidemia and Diabetes. Lung Cancer.  Sonographer:     Charmayne Sheer RDCS (AE) Referring Phys:  [place] Phys: Kathlyn Sacramento MD IMPRESSIONS  1. Left ventricular ejection fraction, by estimation, is 30 to 35%. The left ventricle has moderately decreased function. The left ventricle demonstrates global hypokinesis. Left ventricular diastolic parameters are indeterminate.  2. Right ventricular systolic function is normal. The right ventricular size is normal. Tricuspid regurgitation signal is inadequate for assessing PA pressure.  3. Left atrial size was mild to moderately dilated.  4. The mitral valve is normal in structure and function. Moderate mitral valve regurgitation.  No evidence of mitral stenosis.  5. The aortic valve is normal in structure and function. Aortic valve regurgitation is trivial. Mild to moderate aortic valve sclerosis/calcification is present, without any evidence of aortic stenosis.  6. The inferior vena cava is normal in size with greater than 50% respiratory variability, suggesting right atrial pressure of 3 mmHg. FINDINGS  Left Ventricle: Left ventricular ejection fraction, by estimation, is 30 to 35%. The left ventricle has moderately decreased function. The left ventricle demonstrates global hypokinesis. The left ventricular internal cavity size was normal in size. There is no left ventricular hypertrophy. Left ventricular diastolic parameters are indeterminate. Right Ventricle: The right ventricular size is normal. No increase in right ventricular wall thickness. Right ventricular systolic function is normal. Tricuspid regurgitation signal is inadequate for assessing PA pressure. Left Atrium: Left atrial size was mild to moderately dilated. Right Atrium: Right atrial size was normal in size. Pericardium: There is no  evidence of pericardial effusion. Mitral Valve: The mitral valve is normal in structure and function. Normal mobility of the mitral valve leaflets. Moderate mitral valve regurgitation. No evidence of mitral valve stenosis. MV peak gradient, 7.5 mmHg. The mean mitral valve gradient is 4.0  mmHg. Tricuspid Valve: The tricuspid valve is normal in structure. Tricuspid valve regurgitation is not demonstrated. No evidence of tricuspid stenosis. Aortic Valve: The aortic valve is normal in structure and function. Aortic valve regurgitation is trivial. Aortic regurgitation PHT measures 281 msec. Mild to moderate aortic valve sclerosis/calcification is present, without any evidence of aortic stenosis. Aortic valve mean gradient measures 5.0 mmHg. Aortic valve peak gradient measures 8.5 mmHg. Aortic valve area, by VTI measures 2.53 cm. Pulmonic Valve: The pulmonic valve was normal in structure. Pulmonic valve regurgitation is not visualized. No evidence of pulmonic stenosis. Aorta: The aortic root is normal in size and structure. Venous: The inferior vena cava is normal in size with greater than 50% respiratory variability, suggesting right atrial pressure of 3 mmHg. IAS/Shunts: No atrial level shunt detected by color flow Doppler.  LEFT VENTRICLE PLAX 2D LVIDd:         5.21 cm  Diastology LVIDs:         4.21 cm  LV e' lateral:   7.83 cm/s LV PW:         0.84 cm  LV E/e' lateral: 13.4 LV IVS:        0.89 cm  LV e' medial:    7.94 cm/s LVOT diam:     2.40 cm  LV E/e' medial:  13.2 LV SV:         54 LV SV Index:   26 LVOT Area:     4.52 cm  RIGHT VENTRICLE RV Basal diam:  3.08 cm LEFT ATRIUM             Index       RIGHT ATRIUM           Index LA diam:        4.70 cm 2.28 cm/m  RA Area:     14.10 cm LA Vol (A2C):   62.0 ml 30.08 ml/m RA Volume:   35.20 ml  17.08 ml/m LA Vol (A4C):   68.6 ml 33.29 ml/m LA Biplane Vol: 65.3 ml 31.68 ml/m  AORTIC VALVE                    PULMONIC VALVE AV Area (Vmax):    2.95 cm     PV  Vmax:  1.10 m/s AV Area (Vmean):   2.86 cm     PV Vmean:      68.400 cm/s AV Area (VTI):     2.53 cm     PV VTI:        0.146 m AV Vmax:           146.00 cm/s  PV Peak grad:  4.8 mmHg AV Vmean:          103.000 cm/s PV Mean grad:  2.0 mmHg AV VTI:            0.213 m AV Peak Grad:      8.5 mmHg AV Mean Grad:      5.0 mmHg LVOT Vmax:         95.30 cm/s LVOT Vmean:        65.200 cm/s LVOT VTI:          0.119 m LVOT/AV VTI ratio: 0.56 AI PHT:            281 msec  AORTA Ao Root diam: 3.00 cm MITRAL VALVE MV Area (PHT): 3.92 cm     SHUNTS MV Peak grad:  7.5 mmHg     Systemic VTI:  0.12 m MV Mean grad:  4.0 mmHg     Systemic Diam: 2.40 cm MV Vmax:       1.37 m/s MV Vmean:      92.7 cm/s MV Decel Time: 194 msec MV E velocity: 104.80 cm/s Kathlyn Sacramento MD Electronically signed by Kathlyn Sacramento MD Signature Date/Time: 03/04/2019/2:46:43 PM    Final    [place] RT UPPER EXTREM LTD SOFT TISSUE NON VASCULAR  Result Date: 03/08/2019 CLINICAL DATA:  Swelling and bruising of the right upper extremity. Possible hematoma. EXAM: ULTRASOUND right UPPER EXTREMITY LIMITED TECHNIQUE: Ultrasound examination of the upper extremity soft tissues was performed in the area of clinical concern. COMPARISON:  None. FINDINGS: Diffuse subcutaneous soft tissue swelling/edema and streaky fluid. No discrete fluid collection or hematoma. The underlying musculature is grossly normal. IMPRESSION: Diffuse subcutaneous soft tissue swelling/edema/streaky fluid but no discrete abscess or hematoma. Electronically Signed   By: Marijo Sanes M.D.   On: 03/08/2019 19:13    Assessment and plan-  Patient is a 64 y.o. male with history of stage IV lung cancer, BRAF mutation on targeted therapy, hypertension, hyperlipidemia, CAD with history of CABG, diabetes, anxiety presented to emergency room due to breathing difficulties.he was found have DKA, AKI, mental status change.  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  He has had excellent response to targeted therapy which was documented on CT scan on 01/22/2019.  Cancer treatment dabrafenib and trametinib are held due to acute illness.  Consider outpatient immunotherapy after he recovers from current acute illness.   #Altered mental status, resolved.  #Acute renal failure, ATN, creatinine continues to rise.  Started on hemodialysis.  #Acute NSTEMI /atrial fibrillation with RVR, cardiology on board.  Acute CHF, ejection fraction decreased to 30%.  He is currently on heparin infusion.  A. fib, rate controlled.  On amiodarone.  Managed by cardiology. Patient has history of CAD/CABG.  Dabrafenib may have further contributed to his CHF. Hold dabrafenib and trametinib.   #Microcytic anemia, hemoglobin has trended down to 9.2.  MCV decreased to 77.  Iron panel showed ferritin 676, iron saturation 10.  TIBC 183.  Anemia secondary to CKD.  Ferritin can be falsely elevated due to the acute  illness.  I started patient empirically on oral iron supplementation.  Check reticulocyte panel. Recommend transfuse 1 unit of PRBC during his hemodialysis session.  CODE STATUS, DNR/DNI. Thank you for allowing me to participate in the care of this patient.   Earlie Server, MD, PhD Hematology [facility] Pager- [phone] 03/12/2019

## 2019-03-12 NOTE — Progress Notes (Signed)
PT Cancellation Note  Patient Details Name: Craig Koch MRN: [id number] DOB: 1955/01/09   Cancelled Treatment:    Reason Eval/Treat Not Completed: Other (comment) Initiated PT session this afternoon post HD.  Did L hip ab/ad exercises X 10, then during first heel slide rep it became evident that pt had a loose BM and needed cleaned up.  Nursing notified for clean up, may be able to try back yet again today as time allows. Regardless, PT will continue to follow.   Kreg Shropshire, DPT 03/12/2019, 4:07 PM

## 2019-03-12 NOTE — Progress Notes (Signed)
PROGRESS NOTE                                                                                                                                                                                                             Patient Demographics:    Craig Koch, is a 64 y.o. male, DOB - 1955-08-27, OJJ:[id number]  Admit date - 03/03/2019   Admitting Physician Ivor Costa, MD  Outpatient Primary MD for the patient is Tonia Ghent, MD  LOS - 9  Outpatient Specialists: Oncology  Chief Complaint  Patient presents with  . Shortness of Breath       Brief Narrative 64 year old male with hypertension, hyperlipidemia, type 2 diabetes mellitus, anxiety depression, stage IV lung cancer, CAD with history of CABG, tobacco and alcohol use who presented with generalized weakness and shortness of breath.  In the ED he was found to be in DKA, AKI with creatinine of 1.6, new onset A. fib with RVR.  Required BiPAP and admission to stepdown unit. Hospital course prolonged due to persistent encephalopathy, rapid A. fib, and ATN.   Subjective:   Return from dialysis and was having small amount of bleeding from the right nostril.  Reports feeling tired but breathing better.   Assessment  & Plan :    Principal Problem: Acute tubular necrosis Suspect prerenal with hypovolemia from ketoacidosis.  Creatinine continues to worsen despite fluids and IV bicarb.  Right femoral catheter placed for temporary dialysis (started on 3/8 s/p 2 sessions). Urine output better past 24 hours (central procedures) Follow SPEP, UPEP.  Normal parathyroid hormone, MPO/PR-3 antibody, GBM, complements.   Active symptoms New onset atrial fibrillation with RVR ([place]) Has been in sinus for past few days.  Amiodarone dose adjusted cardiology recommends to continue amiodarone 40 mg twice daily until 3/10, then reduce to 200 mg twice daily. Still on IV heparin.  Unable to  transition to Eliquis or Xarelto due to his poor renal function. Continue low-dose metoprolol.   Acute metabolic encephalopathy Possibly related to alcohol use.  Currently resolved.  Antibiotic discontinued.  Acute systolic CHF (EF 93-71%) NSTEMI Presented with markedly elevated troponin and suspect NSTEMI with respiratory failure and DKA. Continue IV heparin, beta-blocker and statin.   Further ischemic evaluation upon resolution of acute issues. Continue strict I's/O and daily weight.  Uncontrolled type II diabetes mellitus  with DKA DKA resolved on insulin drip.  Patient became hypoglycemic on 3/5 possibly due to increased dose of Lantus and worsened renal function.  Off insulin and being monitored on sliding scale only.  CBG stable.  Alcohol abuse No withdrawal symptoms.  Tobacco abuse Nicotine patch.   Stage IV lung adenocarcinoma Dr. Tasia Catchings following him in the hospital.  Recommend to hold dabrafenib and trametinib.  Iron deficiency anemia Noted for  drop in H&H.  PRBC transfusion planned for 3/10 with HD.  Hypothermia Secondary to DKA.  Resolved    Code Status : DNR (being followed by palliative care)  Family Communication  : Wife involved in care.  Discussed at bedside.  Disposition Plan  : Needs SNF.  Hospital course prolonged due to need for temporary dialysis.  Barriers For Discharge : Ongoing several medical issues.  (Need for IV heparin, progressive ATN with need for inpatient temporary dialysis.  Consults  : Cardiology, nephrology, critical care, oncology  Procedures  : 2D echo, head CT  DVT Prophylaxis  : IV heparin  Lab Results  Component Value Date   PLT 244 03/12/2019    Antibiotics  :    Anti-infectives (From admission, onward)   Start     Dose/Rate Route Frequency Ordered Stop   03/07/19 1200  vancomycin (VANCOCIN) IVPB 1000 mg/200 mL premix     1,000 mg 200 mL/hr over 60 Minutes Intravenous  Once 03/07/19 0801 03/07/19 2024   03/06/19 2200   piperacillin-tazobactam (ZOSYN) IVPB 3.375 g  Status:  Discontinued     3.375 g 12.5 mL/hr over 240 Minutes Intravenous Every 12 hours 03/06/19 0858 03/08/19 1408   03/05/19 1420  vancomycin variable dose per unstable renal function (pharmacist dosing)  Status:  Discontinued      Does not apply See admin instructions 03/05/19 1420 03/08/19 1408   03/05/19 1200  vancomycin (VANCOREADY) IVPB 1500 mg/300 mL     1,500 mg 150 mL/hr over 120 Minutes Intravenous  Once 03/05/19 0944 03/06/19 2324   03/05/19 1100  piperacillin-tazobactam (ZOSYN) IVPB 3.375 g  Status:  Discontinued     3.375 g 12.5 mL/hr over 240 Minutes Intravenous Every 8 hours 03/05/19 0944 03/06/19 0858        Objective:   Vitals:   03/12/19 1130 03/12/19 1134 03/12/19 1218 03/12/19 1348  BP: (!) 96/47 (!) 99/52 (!) 100/53   Pulse: 78 78 79   Resp: (!) 24 17 18    Temp: 98.4 F (36.9 C) 98.4 F (36.9 C) 98 F (36.7 C)   TempSrc: Oral Oral Oral   SpO2: 97% 97% 97% 97%  Weight:  94.6 kg    Height:        Wt Readings from Last 3 Encounters:  03/12/19 94.6 kg  02/14/19 86.6 kg  02/08/19 86.9 kg     Intake/Output Summary (Last 24 hours) at 03/12/2019 1623 Last data filed at 03/12/2019 0422 Gross per 24 hour  Intake --  Output 950 ml  Net -950 ml   Physical exam Middle-aged male, fatigued, HEENT: Pallor present, moist mucosa, supple neck Chest: Clear bilaterally CVS: Normal S1-S2, no murmurs GI: Soft, nondistended, nontender Musculoskeletal: Warm, trace edema bilaterally, puffy arms, right femoral HD catheter      Data Review:    CBC Recent Labs  Lab 03/08/19 0707 03/09/19 0356 03/10/19 0319 03/11/19 0643 03/12/19 0432  WBC 7.9 7.7 6.3 7.1 5.5  HGB 9.2* 9.0* 8.0* 8.0* 7.7*  HCT 26.2* 26.7* 23.1* 22.7* 22.9*  PLT 256  237 244 277 244  MCV 77.1* 81.4 78.0* 77.2* 80.1  MCH 27.1 27.4 27.0 27.2 26.9  MCHC 35.1 33.7 34.6 35.2 33.6  RDW 18.4* 19.9* 19.0* 18.5* 19.3*    Chemistries  Recent Labs   Lab 03/06/19 0152 03/07/19 0310 03/08/19 0707 03/09/19 0356 03/10/19 0319 03/11/19 0643 03/12/19 0432  NA 129*   < > 132* 132* 130* 130* 135  K 3.3*   < > 3.5 4.0 4.0 3.7 3.9  CL 102   < > 105 105 96* 96* 95*  CO2 14*   < > 13* 15* 15* 25 21*  GLUCOSE 182*   < > 59* 90 155* 146* 168*  BUN 75*   < > 92* 98* 101* 109* 81*  CREATININE 4.22*   < > 5.94* 5.94* 6.09* 6.41* 5.29*  CALCIUM 8.2*   < > 7.9* 7.9* 7.5* 7.3* 7.5*  MG 1.9  --   --   --   --   --   --    < > = values in this interval not displayed.   ------------------------------------------------------------------------------------------------------------------ No results for input(s): CHOL, HDL, LDLCALC, TRIG, CHOLHDL, LDLDIRECT in the last 72 hours.  Lab Results  Component Value Date   HGBA1C 11.3 (H) 03/04/2019   ------------------------------------------------------------------------------------------------------------------ No results for input(s): TSH, T4TOTAL, T3FREE, THYROIDAB in the last 72 hours.  Invalid input(s): FREET3 ------------------------------------------------------------------------------------------------------------------ Recent Labs    03/12/19 0432  RETICCTPCT 1.1    Coagulation profile No results for input(s): INR, PROTIME in the last 168 hours.  No results for input(s): DDIMER in the last 72 hours.  Cardiac Enzymes No results for input(s): CKMB, TROPONINI, MYOGLOBIN in the last 168 hours.  Invalid input(s): CK ------------------------------------------------------------------------------------------------------------------    Component Value Date/Time   BNP 330.0 (H) 03/03/2019 1330    Inpatient Medications  Scheduled Meds: . sodium chloride  250 mL Intravenous Once  . [START ON 03/13/2019] acetaminophen  650 mg Oral Once  . amiodarone  400 mg Oral BID  . aspirin EC  81 mg Oral Daily  . chlorhexidine  15 mL Mouth Rinse BID  . Chlorhexidine Gluconate Cloth  6 each Topical  Q0600  . dextromethorphan-guaiFENesin  1 tablet Oral BID  . [START ON 03/13/2019] diphenhydrAMINE  25 mg Oral Once  . dronabinol  5 mg Oral BID AC  . DULoxetine  30 mg Oral Daily  . ferrous sulfate  325 mg Oral BID WC  . insulin aspart  0-5 Units Subcutaneous QHS  . insulin aspart  0-9 Units Subcutaneous TID WC  . ipratropium-albuterol  3 mL Nebulization TID  . mouth rinse  15 mL Mouth Rinse q12n4p  . metoprolol tartrate  12.5 mg Oral BID  . multivitamin  1 tablet Oral QHS  . multivitamin with minerals  1 tablet Oral Daily  . nicotine  21 mg Transdermal Daily  . polyethylene glycol  17 g Oral Daily  . Ensure Max Protein  11 oz Oral BID BM  . rosuvastatin  10 mg Oral Daily  . sodium bicarbonate  1,300 mg Oral BID   Continuous Infusions: . heparin 1,650 Units/hr (03/12/19 1310)  . small volume/piggyback builder 50 mL/hr at 03/12/19 1256   PRN Meds:.acetaminophen, albuterol, dextrose, heparin lock flush, heparin lock flush, menthol-cetylpyridinium, ondansetron (ZOFRAN) IV, pneumococcal 23 valent vaccine, sodium chloride, sodium chloride flush, sodium chloride flush  Micro Results Recent Results (from the past 240 hour(s))  Respiratory Panel by RT PCR (Flu A&B, Covid) - Nasopharyngeal Swab     Status:  None   Collection Time: 03/03/19  3:31 PM   Specimen: Nasopharyngeal Swab  Result Value Ref Range Status   SARS Coronavirus 2 by RT PCR NEGATIVE NEGATIVE Final    Comment: (NOTE) SARS-CoV-2 target nucleic acids are NOT DETECTED. The SARS-CoV-2 RNA is generally detectable in upper respiratoy specimens during the acute phase of infection. The lowest concentration of SARS-CoV-2 viral copies this assay can detect is 131 copies/mL. A negative result does not preclude SARS-Cov-2 infection and should not be used as the sole basis for treatment or other patient management decisions. A negative result may occur with  improper specimen collection/handling, submission of specimen other than  nasopharyngeal swab, presence of viral mutation(s) within the areas targeted by this assay, and inadequate number of viral copies (<131 copies/mL). A negative result must be combined with clinical observations, patient history, and epidemiological information. The expected result is Negative. Fact Sheet for Patients:  PinkCheek.be Fact Sheet for Healthcare Providers:  GravelBags.it This test is not yet ap proved or cleared by the [place] FDA and  has been authorized for detection and/or diagnosis of SARS-CoV-2 by FDA under an Emergency Use Authorization (EUA). This EUA will remain  in effect (meaning this test can be used) for the duration of the COVID-19 declaration under Section 564(b)(1) of the Act, 21 U.S.C. section 360bbb-3(b)(1), unless the authorization is terminated or revoked sooner.    Influenza A by PCR NEGATIVE NEGATIVE Final   Influenza B by PCR NEGATIVE NEGATIVE Final    Comment: (NOTE) The Xpert Xpress SARS-CoV-2/FLU/RSV assay is intended as an aid in  the diagnosis of influenza from Nasopharyngeal swab specimens and  should not be used as a sole basis for treatment. Nasal washings and  aspirates are unacceptable for Xpert Xpress SARS-CoV-2/FLU/RSV  testing. Fact Sheet for Patients: PinkCheek.be Fact Sheet for Healthcare Providers: GravelBags.it This test is not yet approved or cleared by the [place] FDA and  has been authorized for detection and/or diagnosis of SARS-CoV-2 by  FDA under an Emergency Use Authorization (EUA). This EUA will remain  in effect (meaning this test can be used) for the duration of the  Covid-19 declaration under Section 564(b)(1) of the Act, 21  U.S.C. section 360bbb-3(b)(1), unless the authorization is  terminated or revoked. Performed at [facility], [address] [place], [place] [address]   MRSA  PCR Screening     Status: None   Collection Time: 03/03/19  7:48 PM   Specimen: Nasopharyngeal  Result Value Ref Range Status   MRSA by PCR NEGATIVE NEGATIVE Final    Comment:        The GeneXpert MRSA Assay (FDA approved for NASAL specimens only), is one component of a comprehensive MRSA colonization surveillance program. It is not intended to diagnose MRSA infection nor to guide or monitor treatment for MRSA infections. Performed at [facility], [address] [place], [place] [address]   CULTURE, BLOOD (ROUTINE X 2) w Reflex to ID Panel     Status: None   Collection Time: 03/05/19 10:16 AM   Specimen: BLOOD  Result Value Ref Range Status   Specimen Description BLOOD LEFT ANTECUBITAL  Final   Special Requests   Final    BOTTLES DRAWN AEROBIC AND ANAEROBIC Blood Culture adequate volume   Culture   Final    NO GROWTH 5 DAYS Performed at [facility], [address] [place], [place] [address]    Report Status 03/10/2019 FINAL  Final  CULTURE, BLOOD (  ROUTINE X 2) w Reflex to ID Panel     Status: None   Collection Time: 03/05/19 10:22 AM   Specimen: BLOOD  Result Value Ref Range Status   Specimen Description BLOOD BLOOD LEFT HAND  Final   Special Requests   Final    BOTTLES DRAWN AEROBIC ONLY Blood Culture adequate volume   Culture   Final    NO GROWTH 5 DAYS Performed at [facility], [address] [place], [place] [address]    Report Status 03/10/2019 FINAL  Final    Radiology Reports CT HEAD WO CONTRAST  Result Date: 03/05/2019 CLINICAL DATA:  Metastatic disease evaluation.  Lung cancer. EXAM: CT HEAD WITHOUT CONTRAST TECHNIQUE: Contiguous axial images were obtained from the base of the skull through the vertex without intravenous contrast. COMPARISON:  MRI head 09/20/2018 FINDINGS: Brain: Mild atrophy. Mild hypodensity in the white matter unchanged from the prior MRI. No acute infarct, hemorrhage, mass. No edema or midline shift.  Vascular: Negative for hyperdense vessel Skull: Negative skull. Sinuses/Orbits: Mucosal edema and bony thickening right maxillary sinus. Chronic nasal bone fracture. Left cataract extraction. Other: None IMPRESSION: No acute abnormality and negative for metastatic disease on unenhanced CT. Atrophy and mild chronic microvascular ischemia. Electronically Signed   By: Franchot Gallo M.D.   On: 03/05/2019 15:28   US RENAL  Result Date: 03/10/2019 CLINICAL DATA:  64 year old presenting with acute renal failure. Evaluate for obstruction as a possible cause. EXAM: RENAL / URINARY TRACT ULTRASOUND COMPLETE COMPARISON:  Urinary tract ultrasound 03/04/2019. CT abdomen and pelvis 01/22/2019. FINDINGS: Right Kidney: Renal measurements: Approximately 12.6 x 6.4 x 6.2 cm = volume: 264 mL. Mildly echogenic parenchyma. No hydronephrosis. Well-preserved cortex. No shadowing calculi. No focal parenchymal abnormality. Left Kidney: Renal measurements: Approximately 12.5 x 6.3 x 6.9 cm = volume: 283 mL. Mildly echogenic parenchyma. No hydronephrosis. Well-preserved cortex. No shadowing calculi. No focal parenchymal abnormality. Bladder: Normal for degree of bladder distention. Other: None. IMPRESSION: 1. Mildly echogenic renal parenchyma indicative of medical renal disease. 2. Otherwise normal examination. Specifically, no evidence of urinary tract obstruction to account for the acute renal insufficiency. Electronically Signed   By: Evangeline Dakin M.D.   On: 03/10/2019 15:02   US RENAL  Result Date: 03/04/2019 CLINICAL DATA:  Acute kidney injury EXAM: RENAL / URINARY TRACT ULTRASOUND COMPLETE COMPARISON:  CT 01/22/2019 FINDINGS: Right Kidney: Renal measurements: 11.4 x 6 x 6 cm = volume: 215 mL . Echogenicity within normal limits. No mass or hydronephrosis visualized. Left Kidney: Renal measurements: 11.8 x 7.3 x 6.5 cm = volume: 291.8 mL. Echogenicity within normal limits. No mass or hydronephrosis visualized. Bladder: Appears  normal for degree of bladder distention. Other: Incidental note made of gallstones and small effusions. Heterogenous prostate. IMPRESSION: Normal ultrasound appearance of the kidneys Electronically Signed   By: Donavan Foil M.D.   On: 03/04/2019 15:37   PERIPHERAL VASCULAR CATHETERIZATION  Result Date: 03/11/2019 See op note  DG Chest Portable 1 View  Result Date: 03/03/2019 CLINICAL DATA:  Shortness of breath, since Monday worsening this morning around 1 a.m. EXAM: PORTABLE CHEST 1 VIEW COMPARISON:  12/19/2018 FINDINGS: Cardiomediastinal contours are stable following median sternotomy with persistent left-sided pleural effusion and pleural-parenchymal scarring. No new areas of consolidation or evidence of right-sided pleural effusion. No signs of acute bone process. IMPRESSION: Persistent left-sided pleural effusion and pleural-parenchymal scarring. Electronically Signed   By: Zetta Bills M.D.   On: 03/03/2019 13:59   ECHOCARDIOGRAM COMPLETE  Result Date:  03/04/2019    ECHOCARDIOGRAM REPORT   Patient Name:   EITHAN BEAGLE Date of Exam: 03/04/2019 Medical Rec #:  [id number]       Height:       72.0 in Accession #:    [id number]      Weight:       185.0 lb Date of Birth:  01/30/1955       BSA:          2.061 m Patient Age:    8 years        BP:           125/72 mmHg Patient Gender: M               HR:           119 bpm. Exam Location:  [facility] Procedure: 2D Echo, Color Doppler and Cardiac Doppler Indications:     I21.4 NSTEMI  History:         Patient has prior history of Echocardiogram examinations, most                  recent 08/24/2018. CAD, Prior CABG; Risk Factors:Hypertension,                  Dyslipidemia and Diabetes. Lung Cancer.  Sonographer:     Charmayne Sheer RDCS (AE) Referring Phys:  [place] Phys: Kathlyn Sacramento MD IMPRESSIONS  1. Left ventricular ejection fraction, by estimation, is 30 to 35%. The left ventricle has moderately decreased function. The left ventricle  demonstrates global hypokinesis. Left ventricular diastolic parameters are indeterminate.  2. Right ventricular systolic function is normal. The right ventricular size is normal. Tricuspid regurgitation signal is inadequate for assessing PA pressure.  3. Left atrial size was mild to moderately dilated.  4. The mitral valve is normal in structure and function. Moderate mitral valve regurgitation. No evidence of mitral stenosis.  5. The aortic valve is normal in structure and function. Aortic valve regurgitation is trivial. Mild to moderate aortic valve sclerosis/calcification is present, without any evidence of aortic stenosis.  6. The inferior vena cava is normal in size with greater than 50% respiratory variability, suggesting right atrial pressure of 3 mmHg. FINDINGS  Left Ventricle: Left ventricular ejection fraction, by estimation, is 30 to 35%. The left ventricle has moderately decreased function. The left ventricle demonstrates global hypokinesis. The left ventricular internal cavity size was normal in size. There is no left ventricular hypertrophy. Left ventricular diastolic parameters are indeterminate. Right Ventricle: The right ventricular size is normal. No increase in right ventricular wall thickness. Right ventricular systolic function is normal. Tricuspid regurgitation signal is inadequate for assessing PA pressure. Left Atrium: Left atrial size was mild to moderately dilated. Right Atrium: Right atrial size was normal in size. Pericardium: There is no evidence of pericardial effusion. Mitral Valve: The mitral valve is normal in structure and function. Normal mobility of the mitral valve leaflets. Moderate mitral valve regurgitation. No evidence of mitral valve stenosis. MV peak gradient, 7.5 mmHg. The mean mitral valve gradient is 4.0  mmHg. Tricuspid Valve: The tricuspid valve is normal in structure. Tricuspid valve regurgitation is not demonstrated. No evidence of tricuspid stenosis. Aortic Valve:  The aortic valve is normal in structure and function. Aortic valve regurgitation is trivial. Aortic regurgitation PHT measures 281 msec. Mild to moderate aortic valve sclerosis/calcification is present, without any evidence of aortic stenosis. Aortic valve mean gradient measures 5.0 mmHg. Aortic valve peak gradient measures  8.5 mmHg. Aortic valve area, by VTI measures 2.53 cm. Pulmonic Valve: The pulmonic valve was normal in structure. Pulmonic valve regurgitation is not visualized. No evidence of pulmonic stenosis. Aorta: The aortic root is normal in size and structure. Venous: The inferior vena cava is normal in size with greater than 50% respiratory variability, suggesting right atrial pressure of 3 mmHg. IAS/Shunts: No atrial level shunt detected by color flow Doppler.  LEFT VENTRICLE PLAX 2D LVIDd:         5.21 cm  Diastology LVIDs:         4.21 cm  LV e' lateral:   7.83 cm/s LV PW:         0.84 cm  LV E/e' lateral: 13.4 LV IVS:        0.89 cm  LV e' medial:    7.94 cm/s LVOT diam:     2.40 cm  LV E/e' medial:  13.2 LV SV:         54 LV SV Index:   26 LVOT Area:     4.52 cm  RIGHT VENTRICLE RV Basal diam:  3.08 cm LEFT ATRIUM             Index       RIGHT ATRIUM           Index LA diam:        4.70 cm 2.28 cm/m  RA Area:     14.10 cm LA Vol (A2C):   62.0 ml 30.08 ml/m RA Volume:   35.20 ml  17.08 ml/m LA Vol (A4C):   68.6 ml 33.29 ml/m LA Biplane Vol: 65.3 ml 31.68 ml/m  AORTIC VALVE                    PULMONIC VALVE AV Area (Vmax):    2.95 cm     PV Vmax:       1.10 m/s AV Area (Vmean):   2.86 cm     PV Vmean:      68.400 cm/s AV Area (VTI):     2.53 cm     PV VTI:        0.146 m AV Vmax:           146.00 cm/s  PV Peak grad:  4.8 mmHg AV Vmean:          103.000 cm/s PV Mean grad:  2.0 mmHg AV VTI:            0.213 m AV Peak Grad:      8.5 mmHg AV Mean Grad:      5.0 mmHg LVOT Vmax:         95.30 cm/s LVOT Vmean:        65.200 cm/s LVOT VTI:          0.119 m LVOT/AV VTI ratio: 0.56 AI PHT:             281 msec  AORTA Ao Root diam: 3.00 cm MITRAL VALVE MV Area (PHT): 3.92 cm     SHUNTS MV Peak grad:  7.5 mmHg     Systemic VTI:  0.12 m MV Mean grad:  4.0 mmHg     Systemic Diam: 2.40 cm MV Vmax:       1.37 m/s MV Vmean:      92.7 cm/s MV Decel Time: 194 msec MV E velocity: 104.80 cm/s Kathlyn Sacramento MD Electronically signed by Kathlyn Sacramento MD Signature Date/Time: 03/04/2019/2:46:43 PM    Final  [place] RT UPPER EXTREM LTD SOFT TISSUE NON VASCULAR  Result Date: 03/08/2019 CLINICAL DATA:  Swelling and bruising of the right upper extremity. Possible hematoma. EXAM: ULTRASOUND right UPPER EXTREMITY LIMITED TECHNIQUE: Ultrasound examination of the upper extremity soft tissues was performed in the area of clinical concern. COMPARISON:  None. FINDINGS: Diffuse subcutaneous soft tissue swelling/edema and streaky fluid. No discrete fluid collection or hematoma. The underlying musculature is grossly normal. IMPRESSION: Diffuse subcutaneous soft tissue swelling/edema/streaky fluid but no discrete abscess or hematoma. Electronically Signed   By: Marijo Sanes M.D.   On: 03/08/2019 19:13    Time Spent in minutes 35   Avri Paiva M.D on 03/12/2019 at 4:23 PM  Between 7am to 7pm - Pager - [phone]  After 7pm go to [email or web address] - password [facility]  [facility] -  Office  [phone]

## 2019-03-12 NOTE — Progress Notes (Signed)
  Speech Language Pathology Treatment: Dysphagia  Patient Details Name: Craig Koch MRN: [id number] DOB: 06/21/1955 Today's Date: 03/12/2019 Time: 4765-4650 SLP Time Calculation (min) (ACUTE ONLY): 31 min  Assessment / Plan / Recommendation Clinical Impression  Pt seen for ongoing assessment of swallowing; toleration of current diet and trials to upgrade diet as able. Pt awake sitting in bed eating some of his Lunch meal; Wife present. Pt appeared more alert; more engagement in conversation and energy overall.  Recommend diet upgrade to Regular consistency(meats cut for conservation of energy, soft foods) and thin liquids; general aspiration precautions; Pills w/ Water or Whole in Puree if easier, safer for swallowing. Education given to pt and Wife on general aspiration precautions; food consistencies and preparation for conservation of energy during meals. Suggested monitoring sitting/eating in bed -- want to achieve a full, upright position w/ head forward(eating sitting in a chair is best for positioning). Pt and Wife gave verbal agreement w/ education and recommendations re: diet/foods. NSG present and updated. No further skilled ST services indicated at this time. NSG to reconsult if any new needs arise.      HPI HPI: Pt is a 64 y.o. male with medical history significant of hypertension, hyperlipidemia, diabetes mellitus, depression, anxiety, lung cancer, CAD, CABG, tobacco abuse, alcohol abuse, left pleural effusion, who presents with shortness of breath and generalized weakness.  Patient states that he has been having shortness of breath, dry cough and generalized weakness the past several days, which has worsened today.  Denies chest pain, fever or chills.  He states that he normally has O2 saturation at about the 90s.  Patient is using accessory muscle for breathing.  Denies nausea, no vomiting, diarrhea, abdominal pain.  Denies symptoms of UTI or unilateral weakness.  No recent fall.  Pt  was admitted w/ dx of New onset atrial fibrillation with RVR; DKA, AKI.  Per Palliative Oncology note: "stage IV adenocarcinoma of the lung with history of left sided malignant pleural effusion requiring Pleurx (later removed).  Patient has opted not to pursue chemotherapy and has instead been treated on immunotherapy.  Patient has had persistent shortness of breath.  CTA of the chest on 11/02/2018 revealed marked increase in tumor encasing the entire left lung with new diffuse increased interstitial thickening concerning for disease progression and lymphangitic spread of disease.    Repeat CT of the chest and abdomen on 01/22/2019 revealed significant interval decrease of tumor burden on the lung.".      SLP Plan  All goals met       Recommendations  Diet recommendations: Regular;Thin liquid Liquids provided via: Cup;No straw Medication Administration: Whole meds with liquid(or Whole in Puree if needed for ease of swallow) Supervision: Patient able to self feed;Intermittent supervision to cue for compensatory strategies(support) Compensations: Minimize environmental distractions;Slow rate;Small sips/bites;Lingual sweep for clearance of pocketing;Multiple dry swallows after each bite/sip;Follow solids with liquid Postural Changes and/or Swallow Maneuvers: Seated upright 90 degrees;Upright 30-60 min after meal                General recommendations: (dietician f/u as needed) Oral Care Recommendations: Oral care BID;Oral care before and after PO;Patient independent with oral care Follow up Recommendations: None SLP Visit Diagnosis: Dysphagia, unspecified (R13.10) Plan: All goals met       GO                Orinda Kenner, MS, CCC-SLP Koch,Craig 03/12/2019, 3:58 PM

## 2019-03-12 NOTE — Progress Notes (Signed)
HD tx completed    03/12/19 1130  Hand-Off documentation  Report given to (Full Name) Burr Medico, RN   Report received from (Full Name) Beatris Ship, RN   Vital Signs  Temp 98.4 F (36.9 C)  Temp Source Oral  Pulse Rate 78  Pulse Rate Source Monitor  Resp (!) 24  BP (!) 96/47  BP Location Left Arm  BP Method Automatic  Patient Position (if appropriate) Lying  Oxygen Therapy  SpO2 97 %  O2 Device Nasal Cannula  During Hemodialysis Assessment  Blood Flow Rate (mL/min) 250 mL/min  Arterial Pressure (mmHg) -80 mmHg  Venous Pressure (mmHg) 50 mmHg  Transmembrane Pressure (mmHg) 70 mmHg  Ultrafiltration Rate (mL/min) 200 mL/min  Dialysate Flow Rate (mL/min) 500 ml/min  Conductivity: Machine  14.1  HD Safety Checks Performed Yes  Dialysis Fluid Bolus Normal Saline  Bolus Amount (mL) 250 mL  Intra-Hemodialysis Comments Tx completed

## 2019-03-12 NOTE — Progress Notes (Signed)
Daily Progress Note   Patient Name: Craig Koch       Date: 03/12/2019 DOB: 10/15/1955  Age: 64 y.o. MRN#: [id number] Attending Physician: Louellen Molder, MD Primary Care Physician: Tonia Ghent, MD Admit Date: 03/03/2019  Reason for Consultation/Follow-up: Establishing goals of care  Subjective: Patient resting, no complaints. Wife at bedside.   Length of Stay: 9  Current Medications: Scheduled Meds:  . amiodarone  400 mg Oral BID  . aspirin EC  81 mg Oral Daily  . chlorhexidine  15 mL Mouth Rinse BID  . Chlorhexidine Gluconate Cloth  6 each Topical Q0600  . dextromethorphan-guaiFENesin  1 tablet Oral BID  . dronabinol  5 mg Oral BID AC  . DULoxetine  30 mg Oral Daily  . ferrous sulfate  325 mg Oral BID WC  . insulin aspart  0-5 Units Subcutaneous QHS  . insulin aspart  0-9 Units Subcutaneous TID WC  . ipratropium-albuterol  3 mL Nebulization TID  . mouth rinse  15 mL Mouth Rinse q12n4p  . metoprolol tartrate  12.5 mg Oral BID  . multivitamin  1 tablet Oral QHS  . multivitamin with minerals  1 tablet Oral Daily  . nicotine  21 mg Transdermal Daily  . polyethylene glycol  17 g Oral Daily  . Ensure Max Protein  11 oz Oral BID BM  . rosuvastatin  10 mg Oral Daily  . sodium bicarbonate  1,300 mg Oral BID    Continuous Infusions: . heparin 1,650 Units/hr (03/12/19 1310)  . small volume/piggyback builder 50 mL/hr at 03/12/19 1256    PRN Meds: acetaminophen, albuterol, dextrose, menthol-cetylpyridinium, ondansetron (ZOFRAN) IV, pneumococcal 23 valent vaccine, sodium chloride  Physical Exam Constitutional:      General: He is not in acute distress. Pulmonary:     Effort: Pulmonary effort is normal. No tachypnea.  Skin:    General: Skin is warm and dry.  Neurological:   Mental Status: He is alert and oriented to person, place, and time.  Psychiatric:        Mood and Affect: Mood normal.        Behavior: Behavior normal.             Vital Signs: BP (!) 100/53 (BP Location: Left Arm)   Pulse 79   Temp 98 F (36.7 C) (Oral)   Resp 18   Ht 6' (1.829 m)   Wt 94.6 kg   SpO2 97%   BMI 28.29 kg/m  SpO2: SpO2: 97 % O2 Device: O2 Device: Nasal Cannula O2 Flow Rate: O2 Flow Rate (L/min): 2 L/min  Intake/output summary:   Intake/Output Summary (Last 24 hours) at 03/12/2019 1611 Last data filed at 03/12/2019 0422 Gross per 24 hour  Intake --  Output 950 ml  Net -950 ml   LBM: Last BM Date: 03/11/19 Baseline Weight: Weight: 86.2 kg Most recent weight: Weight: 94.6 kg       Palliative Assessment/Data: PPS 40%    Flowsheet Rows     Most Recent Value  Intake Tab  Referral Department  Hospitalist  Unit at Time of Referral  ICU  Palliative Care Primary Diagnosis  Cancer  Date Notified  03/04/19  Palliative Care  Type  New Palliative care  Reason for referral  Non-pain Symptom  Date of Admission  03/03/19  Date first seen by Palliative Care  03/04/19  # of days Palliative referral response time  0 Day(s)  # of days IP prior to Palliative referral  1  Clinical Assessment  Psychosocial & Spiritual Assessment  Palliative Care Outcomes      Patient Active Problem List   Diagnosis Date Noted  . Acute systolic heart failure ([place])   . Pain of right upper extremity   . Symptomatic anemia   . Protein-calorie malnutrition, severe 03/06/2019  . Acute alteration in mental status   . Palliative care encounter   . Atrial fibrillation with RVR ([place]) 03/03/2019  . DKA (diabetic ketoacidoses) ([place]) 03/03/2019  . Diabetes mellitus without complication ([place]) 51/76/1607  . Hypothermia 03/03/2019  . SOB (shortness of breath) 03/03/2019  . Acute renal failure ([place]) 03/03/2019  . NSTEMI (non-ST elevated myocardial infarction) ([place]) 03/03/2019  .  Hyponatremia 02/08/2019  . Uncontrolled type 2 diabetes mellitus with hyperglycemia ([place]) 02/08/2019  . Leg swelling 02/08/2019  . Paronychia, finger 12/19/2018  . Encounter for antineoplastic chemotherapy 12/08/2018  . Tobacco abuse 10/12/2018  . Malignant pleural effusion   . Administrative encounter 09/23/2018  . lung adenocarcinoma 09/08/2018  . Goals of care, counseling/discussion 09/08/2018  . Recurrent pleural effusion on left 09/02/2018  . Elevated troponin 08/24/2018  . Medicare annual wellness visit, subsequent 08/07/2018  . Lower urinary tract symptoms (LUTS) 08/07/2018  . Neck pain 08/07/2018  . CAD (coronary artery disease), native coronary artery 09/23/2017  . Smoker 09/23/2017  . Chronic joint pain 10/18/2016  . Healthcare maintenance 07/17/2016  . Advance care planning 07/17/2016  . Fungal rash of torso 05/06/2016  . Trigger finger 05/06/2016  . History of alcohol use 11/17/2015  . Skin lesion 05/19/2015  . Anxiety state 02/19/2015  . Hand weakness 05/23/2011  . Diabetes mellitus with complication ([place]) 37/10/6267  . HLD (hyperlipidemia) 03/05/2010  . Essential hypertension 03/05/2010  . Coronary atherosclerosis 03/05/2010    Palliative Care Assessment & Plan   HPI: Craig Dada Gerringeris a 64 y.o.malewith multiple medical problems including stage IV adenocarcinoma of the lung with history of left sided malignant pleural effusion requiring Pleurx(later removed). Patient has opted not to pursue chemotherapy and has instead been treated on immunotherapy. Patient has had persistent shortness of breath. CTA of the chest on 11/02/2018 revealed marked increase in tumor encasing the entire left lung with new diffuse increased interstitial thickening concerning for disease progression and lymphangitic spread of disease.Repeat CT of the chest and abdomen on 01/22/2019 revealed significant interval decreaseoftumor burden on the lung. Patient was admitted to the  hospital on 03/03/2019 with shortness of breath and weakness. He was found to have non-STEMI and DKA. Patient was referred to palliative care to help address goals and manage ongoing symptoms.  Assessment: Patient has no complaints - denies symptoms, okay with current plan of care. Tells me he is tolerating HD okay.   Discussed with wife - she has multiple questions about patient's plan of care - reviewed clinical condition with her and current treatments. Also discussed plan to d/c to SNF - she agrees with plan.   Recommendations/Plan:  Continue current care  DNR  Will follow along  Prognosis:   Unable to determine  Discharge Planning:  [place] for rehab with Palliative care service follow-up  Care plan was discussed with patient and wife  Thank you for allowing the Palliative  Medicine Team to assist in the care of this patient.   Total Time 25 minuts Prolonged Time Billed  no       Greater than 50%  of this time was spent counseling and coordinating care related to the above assessment and plan.  Juel Burrow, DNP, [facility] Palliative Medicine Team Team Phone # [phone]  Pager [phone]

## 2019-03-12 NOTE — Progress Notes (Signed)
[name], [place] 03/12/19  Subjective:  Patient seen and evaluated at bedside. Did complete his first dialysis treatment yesterday. Tolerated well.  Due for second dialysis treatment today. Urine output was 950 cc of the patient in 24 hours.   Objective:  Vital signs in last 24 hours:  Temp:  [97.3 F (36.3 C)-98.3 F (36.8 C)] 97.3 F (36.3 C) (03/09 0739) Pulse Rate:  [68-76] 74 (03/09 0753) Resp:  [16-26] 20 (03/09 0753) BP: (97-115)/(46-60) 115/60 (03/09 0739) SpO2:  [97 %-100 %] 97 % (03/09 0753) Weight:  [92 kg-94.5 kg] 94.5 kg (03/09 0344)  Weight change: 0 kg Filed Weights   03/11/19 0455 03/11/19 1600 03/12/19 0344  Weight: 92 kg 92 kg 94.5 kg    Intake/Output:    Intake/Output Summary (Last 24 hours) at 03/12/2019 0854 Last data filed at 03/12/2019 0422 Gross per 24 hour  Intake 552.81 ml  Output 950 ml  Net -397.19 ml     Physical Exam: General:  Ill-appearing, laying in bed  HEENT  moist oral mucous membranes, decreased hearing  Pulm/lungs  clear bilateral, normal effort  CVS/Heart  S1S2 irregular  Abdomen:   Soft, nontender, bowel sounds present  Extremities:  1+ pitting edema  Neurologic:  Awake, alert, conversant  Skin:  Warm, dry          Basic Metabolic Panel:  Recent Labs  Lab 03/06/19 0152 03/07/19 0310 03/08/19 0707 03/08/19 0707 03/09/19 0356 03/09/19 0356 03/10/19 0319 03/11/19 0643 03/12/19 0432  NA 129*   < > 132*  --  132*  --  130* 130* 135  K 3.3*   < > 3.5  --  4.0  --  4.0 3.7 3.9  CL 102   < > 105  --  105  --  96* 96* 95*  CO2 14*   < > 13*  --  15*  --  15* 25 21*  GLUCOSE 182*   < > 59*  --  90  --  155* 146* 168*  BUN 75*   < > 92*  --  98*  --  101* 109* 81*  CREATININE 4.22*   < > 5.94*  --  5.94*  --  6.09* 6.41* 5.29*  CALCIUM 8.2*   < > 7.9*   < > 7.9*   < > 7.5* 7.3* 7.5*  MG 1.9  --   --   --   --   --   --   --   --   PHOS  --   --   --   --   --   --  5.0*  --   --    < >  = values in this interval not displayed.     CBC: Recent Labs  Lab 03/08/19 0707 03/09/19 0356 03/10/19 0319 03/11/19 0643 03/12/19 0432  WBC 7.9 7.7 6.3 7.1 5.5  HGB 9.2* 9.0* 8.0* 8.0* 7.7*  HCT 26.2* 26.7* 23.1* 22.7* 22.9*  MCV 77.1* 81.4 78.0* 77.2* 80.1  PLT 256 237 244 277 244      Lab Results  Component Value Date   HEPBSAG NON REACTIVE 03/11/2019   HEPBIGM NON REACTIVE 03/11/2019      Microbiology:  Recent Results (from the past 240 hour(s))  Respiratory Panel by RT PCR (Flu A&B, Covid) - Nasopharyngeal Swab     Status: None   Collection Time: 03/03/19  3:31 PM   Specimen: Nasopharyngeal Swab  Result Value Ref Range Status  SARS Coronavirus 2 by RT PCR NEGATIVE NEGATIVE Final    Comment: (NOTE) SARS-CoV-2 target nucleic acids are NOT DETECTED. The SARS-CoV-2 RNA is generally detectable in upper respiratoy specimens during the acute phase of infection. The lowest concentration of SARS-CoV-2 viral copies this assay can detect is 131 copies/mL. A negative result does not preclude SARS-Cov-2 infection and should not be used as the sole basis for treatment or other patient management decisions. A negative result may occur with  improper specimen collection/handling, submission of specimen other than nasopharyngeal swab, presence of viral mutation(s) within the areas targeted by this assay, and inadequate number of viral copies (<131 copies/mL). A negative result must be combined with clinical observations, patient history, and epidemiological information. The expected result is Negative. Fact Sheet for Patients:  PinkCheek.be Fact Sheet for Healthcare Providers:  GravelBags.it This test is not yet ap proved or cleared by the [place] FDA and  has been authorized for detection and/or diagnosis of SARS-CoV-2 by FDA under an Emergency Use Authorization (EUA). This EUA will remain  in effect  (meaning this test can be used) for the duration of the COVID-19 declaration under Section 564(b)(1) of the Act, 21 U.S.C. section 360bbb-3(b)(1), unless the authorization is terminated or revoked sooner.    Influenza A by PCR NEGATIVE NEGATIVE Final   Influenza B by PCR NEGATIVE NEGATIVE Final    Comment: (NOTE) The Xpert Xpress SARS-CoV-2/FLU/RSV assay is intended as an aid in  the diagnosis of influenza from Nasopharyngeal swab specimens and  should not be used as a sole basis for treatment. Nasal washings and  aspirates are unacceptable for Xpert Xpress SARS-CoV-2/FLU/RSV  testing. Fact Sheet for Patients: PinkCheek.be Fact Sheet for Healthcare Providers: GravelBags.it This test is not yet approved or cleared by the [place] FDA and  has been authorized for detection and/or diagnosis of SARS-CoV-2 by  FDA under an Emergency Use Authorization (EUA). This EUA will remain  in effect (meaning this test can be used) for the duration of the  Covid-19 declaration under Section 564(b)(1) of the Act, 21  U.S.C. section 360bbb-3(b)(1), unless the authorization is  terminated or revoked. Performed at [facility], [address] [place], [place] [address]   MRSA PCR Screening     Status: None   Collection Time: 03/03/19  7:48 PM   Specimen: Nasopharyngeal  Result Value Ref Range Status   MRSA by PCR NEGATIVE NEGATIVE Final    Comment:        The GeneXpert MRSA Assay (FDA approved for NASAL specimens only), is one component of a comprehensive MRSA colonization surveillance program. It is not intended to diagnose MRSA infection nor to guide or monitor treatment for MRSA infections. Performed at [facility], [address] [place], [place] [address]   CULTURE, BLOOD (ROUTINE X 2) w Reflex to ID Panel     Status: None   Collection Time: 03/05/19 10:16 AM   Specimen: BLOOD  Result Value Ref Range  Status   Specimen Description BLOOD LEFT ANTECUBITAL  Final   Special Requests   Final    BOTTLES DRAWN AEROBIC AND ANAEROBIC Blood Culture adequate volume   Culture   Final    NO GROWTH 5 DAYS Performed at [facility], [address], [place], [place] [address]    Report Status 03/10/2019 FINAL  Final  CULTURE, BLOOD (ROUTINE X 2) w Reflex to ID Panel     Status: None   Collection Time: 03/05/19 10:22 AM  Specimen: BLOOD  Result Value Ref Range Status   Specimen Description BLOOD BLOOD LEFT HAND  Final   Special Requests   Final    BOTTLES DRAWN AEROBIC ONLY Blood Culture adequate volume   Culture   Final    NO GROWTH 5 DAYS Performed at [facility], [address] [place],  [address]    Report Status 03/10/2019 FINAL  Final    Coagulation Studies: No results for input(s): LABPROT, INR in the last 72 hours.  Urinalysis: No results for input(s): COLORURINE, LABSPEC, PHURINE, GLUCOSEU, HGBUR, BILIRUBINUR, KETONESUR, PROTEINUR, UROBILINOGEN, NITRITE, LEUKOCYTESUR in the last 72 hours.  Invalid input(s): APPERANCEUR    Imaging: US RENAL  Result Date: 03/10/2019 CLINICAL DATA:  64 year old presenting with acute renal failure. Evaluate for obstruction as a possible cause. EXAM: RENAL / URINARY TRACT ULTRASOUND COMPLETE COMPARISON:  Urinary tract ultrasound 03/04/2019. CT abdomen and pelvis 01/22/2019. FINDINGS: Right Kidney: Renal measurements: Approximately 12.6 x 6.4 x 6.2 cm = volume: 264 mL. Mildly echogenic parenchyma. No hydronephrosis. Well-preserved cortex. No shadowing calculi. No focal parenchymal abnormality. Left Kidney: Renal measurements: Approximately 12.5 x 6.3 x 6.9 cm = volume: 283 mL. Mildly echogenic parenchyma. No hydronephrosis. Well-preserved cortex. No shadowing calculi. No focal parenchymal abnormality. Bladder: Normal for degree of bladder distention. Other: None. IMPRESSION: 1. Mildly echogenic renal parenchyma indicative of  medical renal disease. 2. Otherwise normal examination. Specifically, no evidence of urinary tract obstruction to account for the acute renal insufficiency. Electronically Signed   By: Evangeline Dakin M.D.   On: 03/10/2019 15:02   PERIPHERAL VASCULAR CATHETERIZATION  Result Date: 03/11/2019 See op note    Medications:   . heparin 1,650 Units/hr (03/12/19 0300)  . small volume/piggyback builder Stopped (03/11/19 1145)   . amiodarone  400 mg Oral BID  . aspirin EC  81 mg Oral Daily  . chlorhexidine  15 mL Mouth Rinse BID  . Chlorhexidine Gluconate Cloth  6 each Topical Q0600  . dextromethorphan-guaiFENesin  1 tablet Oral BID  . dronabinol  5 mg Oral BID AC  . DULoxetine  30 mg Oral Daily  . ferrous sulfate  325 mg Oral BID WC  . insulin aspart  0-5 Units Subcutaneous QHS  . insulin aspart  0-9 Units Subcutaneous TID WC  . ipratropium-albuterol  3 mL Nebulization TID  . mouth rinse  15 mL Mouth Rinse q12n4p  . metoprolol tartrate  12.5 mg Oral BID  . multivitamin  1 tablet Oral QHS  . multivitamin with minerals  1 tablet Oral Daily  . nicotine  21 mg Transdermal Daily  . polyethylene glycol  17 g Oral Daily  . Ensure Max Protein  11 oz Oral BID BM  . rosuvastatin  10 mg Oral Daily  . sodium bicarbonate  1,300 mg Oral BID   acetaminophen, albuterol, dextrose, menthol-cetylpyridinium, ondansetron (ZOFRAN) IV, pneumococcal 23 valent vaccine, sodium chloride  Assessment/ Plan:  64 y.o. male with hypertension, hyperlipidemia, diabetes, depression, anxiety, adenocarcinoma of the lung stage IV, coronary disease, history of CABG, tobacco and h/o alcohol abuse, left pleural effusion,  admitted on 03/03/2019 for DKA (diabetic ketoacidoses) ([place]) [E11.10] New onset atrial fibrillation ([place]) [I48.91] Atrial fibrillation with rapid ventricular response ([place]) [I48.91] Diabetic ketoacidosis without coma associated with other specified diabetes mellitus ([place]) [E13.10]     #Acute kidney injury,  Metabolic acidosis Baseline creatinine of 0.7 on February 5.  Patient did have IV contrast exposure on January 19. Renal imaging: Kidney ultrasound March 1: Normal kidneys  Acute kidney injury is likely secondary to ATN caused by hypotension, hemodynamic instability.   Patient underwent first dialysis treatment yesterday.  Tolerated well.  We plan for second Alysis treatment today.  Urine output did increase a bit to 950 cc.  Continue to monitor progress.   #Hyponatremia Serum sodium improved to 135 with dialysis.  Continue to monitor.  #Diabetic ketoacidosis, poorly controlled diabetes Management as per ICU team Lab Results  Component Value Date   HGBA1C 11.3 (H) 03/04/2019   Multiple underlying chronic illnesses, poorly controlled diabetes, reported history of tobacco and h/o alcohol abuse, with underlying coronary disease, history of CABG, CHF with EF 30 to 35%, stage IV adenocarcinoma  # Hypokalemia Potassium up to 3.9.  Continue to monitor.    LOS: 9 Jahziel Sinn 3/9/20218:54 AM  [place], [place]  Note: This note was prepared with Dragon dictation. Any transcription errors are unintentional

## 2019-03-12 NOTE — Progress Notes (Signed)
PT Cancellation Note  Patient Details Name: CHARLS CUSTER MRN: [id number] DOB: 20-Jun-1955   Cancelled Treatment:    Reason Eval/Treat Not Completed: Patient at procedure or test/unavailable Pt out of room for dialysis.  As time allows may be able to try back this afternoon.  Given temporary femoral catheter a lot of functional activities will limited.    Kreg Shropshire, DPT 03/12/2019, 11:18 AM

## 2019-03-13 ENCOUNTER — Other Ambulatory Visit (INDEPENDENT_AMBULATORY_CARE_PROVIDER_SITE_OTHER): Payer: Self-pay | Admitting: Vascular Surgery

## 2019-03-13 LAB — BASIC METABOLIC PANEL
Anion gap: 12 (ref 5–15)
BUN: 50 mg/dL — ABNORMAL HIGH (ref 8–23)
CO2: 27 mmol/L (ref 22–32)
Calcium: 7.6 mg/dL — ABNORMAL LOW (ref 8.9–10.3)
Chloride: 98 mmol/L (ref 98–111)
Creatinine, Ser: 4.09 mg/dL — ABNORMAL HIGH (ref 0.61–1.24)
GFR calc Af Amer: 17 mL/min — ABNORMAL LOW (ref >60)
GFR calc non Af Amer: 15 mL/min — ABNORMAL LOW (ref >60)
Glucose, Bld: 228 mg/dL — ABNORMAL HIGH (ref 70–99)
Potassium: 3.5 mmol/L (ref 3.5–5.1)
Sodium: 137 mmol/L (ref 135–145)

## 2019-03-13 LAB — CBC
HCT: 25 % — ABNORMAL LOW (ref 39.0–52.0)
Hemoglobin: 8.2 g/dL — ABNORMAL LOW (ref 13.0–17.0)
MCH: 27.7 pg (ref 26.0–34.0)
MCHC: 32.8 g/dL (ref 30.0–36.0)
MCV: 84.5 fL (ref 80.0–100.0)
Platelets: 277 10*3/uL (ref 150–400)
RBC: 2.96 MIL/uL — ABNORMAL LOW (ref 4.22–5.81)
RDW: 18.6 % — ABNORMAL HIGH (ref 11.5–15.5)
WBC: 6.3 10*3/uL (ref 4.0–10.5)
nRBC: 0 % (ref 0.0–0.2)

## 2019-03-13 LAB — PROTEIN ELECTROPHORESIS, SERUM
A/G Ratio: 0.9 (ref 0.7–1.7)
Albumin ELP: 2.2 g/dL — ABNORMAL LOW (ref 2.9–4.4)
Alpha-1-Globulin: 0.4 g/dL (ref 0.0–0.4)
Alpha-2-Globulin: 0.7 g/dL (ref 0.4–1.0)
Beta Globulin: 0.6 g/dL — ABNORMAL LOW (ref 0.7–1.3)
Gamma Globulin: 0.8 g/dL (ref 0.4–1.8)
Globulin, Total: 2.5 g/dL (ref 2.2–3.9)
Total Protein ELP: 4.7 g/dL — ABNORMAL LOW (ref 6.0–8.5)

## 2019-03-13 LAB — GLUCOSE, CAPILLARY
Glucose-Capillary: 206 mg/dL — ABNORMAL HIGH (ref 70–99)
Glucose-Capillary: 165 mg/dL — ABNORMAL HIGH (ref 70–99)
Glucose-Capillary: 228 mg/dL — ABNORMAL HIGH (ref 70–99)
Glucose-Capillary: 208 mg/dL — ABNORMAL HIGH (ref 70–99)

## 2019-03-13 LAB — HEPARIN LEVEL (UNFRACTIONATED)
Heparin Unfractionated: 0.3 IU/mL (ref 0.30–0.70)
Heparin Unfractionated: 0.42 IU/mL (ref 0.30–0.70)

## 2019-03-13 LAB — HEPATITIS B SURFACE ANTIBODY, QUANTITATIVE: Hep B S AB Quant (Post): <3.1 m[IU]/mL — ABNORMAL LOW (ref >9.9)

## 2019-03-13 MED ORDER — SODIUM CHLORIDE 0.9% FLUSH
10.0000 mL | Freq: Two times a day (BID) | INTRAVENOUS | Status: DC
  Administered 2019-03-13 – 2019-03-19 (×12): 10 mL

## 2019-03-13 MED ORDER — SODIUM CHLORIDE 0.9% FLUSH: 10.0000 mL | INTRAVENOUS | Status: DC | PRN

## 2019-03-13 MED ORDER — POTASSIUM CHLORIDE CRYS ER 20 MEQ PO TBCR
40.0000 meq | EXTENDED_RELEASE_TABLET | Freq: Once | ORAL | Status: AC
  Administered 2019-03-13: 40 meq via ORAL
  Filled 2019-03-13: qty 2

## 2019-03-13 NOTE — Progress Notes (Signed)
Pre HD Assessment   03/13/19 0645  Neurological  Level of Consciousness Alert  Orientation Level Oriented X4  Respiratory  Respiratory Pattern Regular;Unlabored  Chest Assessment Chest expansion symmetrical  Bilateral Breath Sounds Clear;Diminished  Cough None  Cardiac  Pulse Regular  Heart Sounds S1, S2  ECG Monitor Yes  Cardiac Rhythm NSR  Vascular  R Radial Pulse +2  L Radial Pulse +2  Edema Generalized  Psychosocial  Psychosocial (WDL) WDL  Patient Behaviors Calm;Cooperative

## 2019-03-13 NOTE — Progress Notes (Signed)
[facility]  Communication Note  (03/11/19):  Ultrasound guidance for vascular access Right femoral vein Placement of a 30 cm triple lumen dialysis catheter right femoral vein  Will plan on insertion of a permcath Thursday with Dr. Lucky Cowboy. Will pre-op.  Marcelle Overlie PA-C 03/13/2019 11:57 AM

## 2019-03-13 NOTE — Progress Notes (Signed)
[facility], [place] 03/13/19  Subjective:  Renal parameters have improved significantly with dialysis treatment. However urine output still low at 400 cc over the preceding 24 hours. Therefore he may need prolonged outpatient hemodialysis for acute kidney injury.   Objective:  Vital signs in last 24 hours:  Temp:  [97.4 F (36.3 C)-98.4 F (36.9 C)] 97.4 F (36.3 C) (03/10 0851) Pulse Rate:  [75-90] 80 (03/10 0930) Resp:  [17-25] 21 (03/10 0930) BP: (95-135)/(39-72) 121/58 (03/10 0930) SpO2:  [93 %-100 %] 99 % (03/10 0930) Weight:  [94.5 kg-94.6 kg] 94.5 kg (03/10 0649)  Weight change: 2.8 kg Filed Weights   03/12/19 1134 03/13/19 0401 03/13/19 0649  Weight: 94.6 kg 94.5 kg 94.5 kg    Intake/Output:    Intake/Output Summary (Last 24 hours) at 03/13/2019 0953 Last data filed at 03/13/2019 0836 Gross per 24 hour  Intake 590 ml  Output 400 ml  Net 190 ml     Physical Exam: General:  Ill-appearing, laying in bed  HEENT  moist oral mucous membranes, decreased hearing  Pulm/lungs  clear bilateral, normal effort  CVS/Heart  S1S2 irregular  Abdomen:   Soft, nontender, bowel sounds present  Extremities:  1+ pitting edema  Neurologic:  Awake, alert, conversant  Skin:  Warm, dry          Basic Metabolic Panel:  Recent Labs  Lab 03/09/19 0356 03/09/19 0356 03/10/19 0319 03/10/19 0319 03/11/19 0643 03/12/19 0432 03/12/19 1012 03/13/19 0417  NA 132*  --  130*  --  130* 135  --  137  K 4.0  --  4.0  --  3.7 3.9  --  3.5  CL 105  --  96*  --  96* 95*  --  98  CO2 15*  --  15*  --  25 21*  --  27  GLUCOSE 90  --  155*  --  146* 168*  --  228*  BUN 98*  --  101*  --  109* 81*  --  50*  CREATININE 5.94*  --  6.09*  --  6.41* 5.29*  --  4.09*  CALCIUM 7.9*   < > 7.5*   < > 7.3* 7.5*  --  7.6*  PHOS  --   --  5.0*  --   --   --  4.7*  --    < > = values in this interval not displayed.     CBC: Recent Labs  Lab 03/08/19 0707  03/09/19 0356 03/10/19 0319 03/11/19 0643 03/12/19 0432  WBC 7.9 7.7 6.3 7.1 5.5  HGB 9.2* 9.0* 8.0* 8.0* 7.7*  HCT 26.2* 26.7* 23.1* 22.7* 22.9*  MCV 77.1* 81.4 78.0* 77.2* 80.1  PLT 256 237 244 277 244      Lab Results  Component Value Date   HEPBSAG NON REACTIVE 03/11/2019   HEPBIGM NON REACTIVE 03/11/2019      Microbiology:  Recent Results (from the past 240 hour(s))  Respiratory Panel by RT PCR (Flu A&B, Covid) - Nasopharyngeal Swab     Status: None   Collection Time: 03/03/19  3:31 PM   Specimen: Nasopharyngeal Swab  Result Value Ref Range Status   SARS Coronavirus 2 by RT PCR NEGATIVE NEGATIVE Final    Comment: (NOTE) SARS-CoV-2 target nucleic acids are NOT DETECTED. The SARS-CoV-2 RNA is generally detectable in upper respiratoy specimens during the acute phase of infection. The lowest concentration of SARS-CoV-2 viral copies this assay can detect is 131  copies/mL. A negative result does not preclude SARS-Cov-2 infection and should not be used as the sole basis for treatment or other patient management decisions. A negative result may occur with  improper specimen collection/handling, submission of specimen other than nasopharyngeal swab, presence of viral mutation(s) within the areas targeted by this assay, and inadequate number of viral copies (<131 copies/mL). A negative result must be combined with clinical observations, patient history, and epidemiological information. The expected result is Negative. Fact Sheet for Patients:  PinkCheek.be Fact Sheet for Healthcare Providers:  GravelBags.it This test is not yet ap proved or cleared by the [place] FDA and  has been authorized for detection and/or diagnosis of SARS-CoV-2 by FDA under an Emergency Use Authorization (EUA). This EUA will remain  in effect (meaning this test can be used) for the duration of the COVID-19 declaration under Section  564(b)(1) of the Act, 21 U.S.C. section 360bbb-3(b)(1), unless the authorization is terminated or revoked sooner.    Influenza A by PCR NEGATIVE NEGATIVE Final   Influenza B by PCR NEGATIVE NEGATIVE Final    Comment: (NOTE) The Xpert Xpress SARS-CoV-2/FLU/RSV assay is intended as an aid in  the diagnosis of influenza from Nasopharyngeal swab specimens and  should not be used as a sole basis for treatment. Nasal washings and  aspirates are unacceptable for Xpert Xpress SARS-CoV-2/FLU/RSV  testing. Fact Sheet for Patients: PinkCheek.be Fact Sheet for Healthcare Providers: GravelBags.it This test is not yet approved or cleared by the [place] FDA and  has been authorized for detection and/or diagnosis of SARS-CoV-2 by  FDA under an Emergency Use Authorization (EUA). This EUA will remain  in effect (meaning this test can be used) for the duration of the  Covid-19 declaration under Section 564(b)(1) of the Act, 21  U.S.C. section 360bbb-3(b)(1), unless the authorization is  terminated or revoked. Performed at [facility], [address] [place], [place] [address]   MRSA PCR Screening     Status: None   Collection Time: 03/03/19  7:48 PM   Specimen: Nasopharyngeal  Result Value Ref Range Status   MRSA by PCR NEGATIVE NEGATIVE Final    Comment:        The GeneXpert MRSA Assay (FDA approved for NASAL specimens only), is one component of a comprehensive MRSA colonization surveillance program. It is not intended to diagnose MRSA infection nor to guide or monitor treatment for MRSA infections. Performed at [facility], [address] [place], [place] [address]   CULTURE, BLOOD (ROUTINE X 2) w Reflex to ID Panel     Status: None   Collection Time: 03/05/19 10:16 AM   Specimen: BLOOD  Result Value Ref Range Status   Specimen Description BLOOD LEFT ANTECUBITAL  Final   Special Requests   Final     BOTTLES DRAWN AEROBIC AND ANAEROBIC Blood Culture adequate volume   Culture   Final    NO GROWTH 5 DAYS Performed at [facility], [address] [place], [place] [address]    Report Status 03/10/2019 FINAL  Final  CULTURE, BLOOD (ROUTINE X 2) w Reflex to ID Panel     Status: None   Collection Time: 03/05/19 10:22 AM   Specimen: BLOOD  Result Value Ref Range Status   Specimen Description BLOOD BLOOD LEFT HAND  Final   Special Requests   Final    BOTTLES DRAWN AEROBIC ONLY Blood Culture adequate volume   Culture   Final    NO GROWTH 5 DAYS  Performed at [facility], [address] [place], [place] [address]    Report Status 03/10/2019 FINAL  Final    Coagulation Studies: No results for input(s): LABPROT, INR in the last 72 hours.  Urinalysis: No results for input(s): COLORURINE, LABSPEC, PHURINE, GLUCOSEU, HGBUR, BILIRUBINUR, KETONESUR, PROTEINUR, UROBILINOGEN, NITRITE, LEUKOCYTESUR in the last 72 hours.  Invalid input(s): APPERANCEUR    Imaging: PERIPHERAL VASCULAR CATHETERIZATION  Result Date: 03/11/2019 See op note    Medications:   . heparin 1,650 Units/hr (03/13/19 0506)  . small volume/piggyback builder 50 mL/hr at 03/12/19 1256   . sodium chloride  250 mL Intravenous Once  . amiodarone  400 mg Oral BID  . aspirin EC  81 mg Oral Daily  . chlorhexidine  15 mL Mouth Rinse BID  . Chlorhexidine Gluconate Cloth  6 each Topical Q0600  . dextromethorphan-guaiFENesin  1 tablet Oral BID  . dronabinol  5 mg Oral BID AC  . DULoxetine  30 mg Oral Daily  . ferrous sulfate  325 mg Oral BID WC  . insulin aspart  0-5 Units Subcutaneous QHS  . insulin aspart  0-9 Units Subcutaneous TID WC  . ipratropium-albuterol  3 mL Nebulization TID  . mouth rinse  15 mL Mouth Rinse q12n4p  . metoprolol tartrate  12.5 mg Oral BID  . multivitamin  1 tablet Oral QHS  . multivitamin with minerals  1 tablet Oral Daily  . nicotine  21 mg Transdermal Daily  .  oxymetazoline  1 spray Each Nare BID  . polyethylene glycol  17 g Oral Daily  . Ensure Max Protein  11 oz Oral BID BM  . rosuvastatin  10 mg Oral Daily  . sodium bicarbonate  1,300 mg Oral BID  . sodium chloride flush  10-40 mL Intracatheter Q12H   acetaminophen, albuterol, dextrose, heparin lock flush, heparin lock flush, menthol-cetylpyridinium, ondansetron (ZOFRAN) IV, pneumococcal 23 valent vaccine, sodium chloride, sodium chloride flush, sodium chloride flush  Assessment/ Plan:  64 y.o. male with hypertension, hyperlipidemia, diabetes, depression, anxiety, adenocarcinoma of the lung stage IV, coronary disease, history of CABG, tobacco and h/o alcohol abuse, left pleural effusion,  admitted on 03/03/2019 for DKA (diabetic ketoacidoses) ([place]) [E11.10] New onset atrial fibrillation ([place]) [I48.91] Atrial fibrillation with rapid ventricular response ([place]) [I48.91] Diabetic ketoacidosis without coma associated with other specified diabetes mellitus ([place]) [E13.10]     #Acute kidney injury, Metabolic acidosis Baseline creatinine of 0.7 on February 5.  Patient did have IV contrast exposure on January 19. Renal imaging: Kidney ultrasound March 1: Normal kidneys  Acute kidney injury is likely secondary to ATN caused by hypotension, hemodynamic instability.   Patient undergoing third dialysis treatment today.  Urine output did drop off yesterday to 400 cc over the preceding 24 hours.  Therefore it appears he will need ongoing dialysis for now for acute kidney injury.  We will begin the process of placement for his underlying acute kidney injury.   #Hyponatremia Sodium currently 137.  Continue to monitor this.  #Diabetic ketoacidosis, poorly controlled diabetes Management as per ICU team Lab Results  Component Value Date   HGBA1C 11.3 (H) 03/04/2019   Multiple underlying chronic illnesses, poorly controlled diabetes, reported history of tobacco and h/o alcohol abuse, with underlying  coronary disease, history of CABG, CHF with EF 30 to 35%, stage IV adenocarcinoma  # Hypokalemia Potassium currently 3.5.  Patient being dialyzed against a 3K bath.    LOS: 10 Luwanda Starr 3/10/20219:53 AM  [facility]  [place], [place]  Note: This note was prepared with Dragon dictation. Any transcription errors are unintentional

## 2019-03-13 NOTE — Consult Note (Signed)
ANTICOAGULATION CONSULT NOTE  Pharmacy Consult for Heparin  Indication: atrial fibrillation- new onset  Patient Measurements: Height: 6' (182.9 cm) Weight: 208 lb 5.4 oz (94.5 kg) IBW/kg (Calculated) : 77.6 Heparin Dosing Weight: 86.2 kg   Vital Signs: Temp: 98.4 F (36.9 C) (03/10 0401) Temp Source: Oral (03/10 0401) BP: 122/62 (03/10 0401) Pulse Rate: 78 (03/10 0401)  Labs: Recent Labs    03/11/19 0643 03/11/19 0643 03/11/19 1949 03/12/19 0432 03/13/19 0417  HGB 8.0*  --   --  7.7*  --   HCT 22.7*  --   --  22.9*  --   PLT 277  --   --  244  --   HEPARINUNFRC 0.70   < > 0.51 0.37 0.30  CREATININE 6.41*  --   --  5.29* 4.09*   < > = values in this interval not displayed.    Estimated Creatinine Clearance: 22.1 mL/min (A) (by C-G formula based on SCr of 4.09 mg/dL (H)).  Medications:  Called RN to confirm whether pt has been on [facility] prior to admission. Nurse was not able to confirm as she was not in the patient's room. Per chart review, no AC prior to admission and confirmed with pharmacy technician who spoke to patient.   Heparin Course: 03/03 0152 HL 0.23: inc to 1950 units/hr 03/03 1449 HL 0.32: no change 03/04 0633 HL <0.10: inc to 2200 units/hr 03/04 1856 HL 0.14: 2000 unit bolus, incto 2400 units/hr.  03/05 0707 HL <0.10: 2500 units bolus, inc to 2600 units/hr 03/05 1848 HL 1.26 held Heparin for 1 hour and decrease heparin drip to 2400 units/hr. 03/06 0727 HL 0.96 Will decrease heparin to 2250 units/hr.  3/6 1709 HL 1.33  3/7 0319 HL 3.44  3/7 1350 HL 0.45  3/7 2200 HL 0.46 3/8 0643 HL: 0.70 3/8 2025 HL 0.5. Therapeutic. S/p reduced infusion.   Assessment: Craig Koch is a 64 y.o. male with a past medical history of anxiety, CAD, diabetes, and hyperlipidemia, hypertension, lung cancer, presents to the emergency department for shortness of breath. Pharmacy was consulted for heparin dosing in a patient with new onset atrial fibrillation. H&H trending down  slightly, platelets are stable   Goal of Therapy:  Heparin level 0.3-0.7 units/ml Monitor platelets by anticoagulation protocol: Yes   Plan:  03/10 @ 0400 HL 0.30 therapeutic, but has been trending down. Will continue current rate and will recheck HL at 1000 since levels continue to trend down. hgb also trending down will continue to monitor.  Tobie Lords, PharmD, BCPS Clinical Pharmacist 03/13/2019 5:11 AM

## 2019-03-13 NOTE — Progress Notes (Signed)
Per Dr. Holley Raring, patient may require dialysis upon discharge. Following for hemodialysis out patient placement.

## 2019-03-13 NOTE — Progress Notes (Signed)
PROGRESS NOTE    Craig Koch  OEV:[id number] DOB: [birth year] DOA: 03/03/2019  PCP: Tonia Ghent, MD    LOS - 10   Brief Narrative:  64 year old male with hypertension, hyperlipidemia, type 2 diabetes mellitus, anxiety depression, stage IV lung cancer, CAD with history of CABG, tobacco and alcohol use who presented with generalized weakness and shortness of breath.  In the ED he was found to be in DKA, AKI with creatinine of 1.6, new onset A. fib with RVR.  Required BiPAP and admission to stepdown unit.  Hospital course prolonged due to persistent encephalopathy, rapid A. fib, and ATN.  Subjective 3/10: Patient seen after dialysis this morning, wife at bedside during encounter.  He reports having upset stomach today but without any nausea or vomiting.  States his stomach just feels wobbly and sort of gassy.  He also reports about 3 episodes of diarrhea since yesterday.  Denies any fevers or chills, or other acute complaints.  States he feels well other than his stomach today.  Assessment & Plan:   Principal Problem:   Atrial fibrillation with RVR (HCC) Active Problems:   HLD (hyperlipidemia)   Essential hypertension   History of alcohol use   CAD (coronary artery disease), native coronary artery   lung adenocarcinoma   Tobacco abuse   Uncontrolled type 2 diabetes mellitus with hyperglycemia (HCC)   DKA (diabetic ketoacidoses) (HCC)   Hypothermia   SOB (shortness of breath)   Acute renal failure (HCC)   NSTEMI (non-ST elevated myocardial infarction) (HCC)   Acute alteration in mental status   Palliative care encounter   Protein-calorie malnutrition, severe   Acute systolic heart failure (HCC)   Pain of right upper extremity   Symptomatic anemia   AKI (acute kidney injury) ([place])   AKI secondary to Acute Tubular Necrosis Baseline Cr 0.7. Secondary to hypovolemia.  Now in intermittent hemodialysis. Right femoral temporary dialysis catheter placed (started on 3/8, 3rd  session today 3/10). Poor UO past 24 hours (400cc), likely will need longer term dialysis while AKI recovers. Evaluation thus far: normal PTH, MPO/PR-3 antibody, GBM, complements. --nephrology following --TOC following for outpatient dialysis set up --f/u SPEP, UPEP --dialysis per nephro  New onset atrial fibrillation with RVR - paroxysmal Has been in sinus for past few days.   --Cardiology following --amiodarone 400 mg PO BID until 3/10, then 200 mg PO BID --continue heparin gtt --unable to transition to Eliquis or Xarelto with renal failure --continue low dose metoprolol  Acute metabolic encephalopathy - resolved Possibly related to alcohol use.  Antibiotic discontinued.  Acute systolic CHF (EF 82-99%) NSTEMI Presented with markedly elevated troponin and suspect NSTEMI with respiratory failure and DKA. --Cardiology following --on heparin gtt --continue beta blocker, statin --further ischemic evaluation pending resolution of acute renal failure --monitor volume status closely: strict I/O's, daily weights   Uncontrolled type II diabetes mellitus with DKA DKA resolved on insulin drip.  Patient became hypoglycemic on 3/5 possibly due to increased dose of Lantus and worsened renal function.  Off insulin and being monitored on sliding scale only.  CBG stable.  Alcohol abuse No withdrawal symptoms.  Tobacco abuse Nicotine patch.   Stage IV lung adenocarcinoma Dr. Tasia Catchings following him in the hospital.  Recommend to hold dabrafenib and trametinib.  Iron deficiency anemia Noted for  drop in H&H.  PRBC transfusion planned for 3/10 with HD.  Hypothermia Secondary to DKA.  Resolved    DVT prophylaxis: on heparin gtt   Code Status: DNR  Family Communication: wife at bedside during encounter, updated and all questions answered   Disposition Plan:  Expect d/c to SNF pending set up of outpatient dialysis, placement of Permcath, cleared by nephrology and  cardiology. Coming From home Exp DC Date 3/12-13 Barriers as above Medically Stable for Discharge? No   Consultants:   Nephrology  Cardiology  Procedures:   2D Echo   Antimicrobials:   None    Objective: Vitals:   03/13/19 0815 03/13/19 0830 03/13/19 0845 03/13/19 0851  BP: 127/72 134/60 (!) 123/54 (!) 123/57  Pulse: 88 90 85 83  Resp: (!) 21 17 (!) 25 (!) 21  Temp:  (!) 97.4 F (36.3 C)  (!) 97.4 F (36.3 C)  TempSrc:  Oral  Oral  SpO2: 99% 98% 98% 99%  Weight:      Height:        Intake/Output Summary (Last 24 hours) at 03/13/2019 0854 Last data filed at 03/13/2019 0836 Gross per 24 hour  Intake 590 ml  Output 400 ml  Net 190 ml   Filed Weights   03/12/19 1134 03/13/19 0401 03/13/19 0649  Weight: 94.6 kg 94.5 kg 94.5 kg    Examination:  General exam: awake, alert, no acute distress HEENT: moist mucus membranes, hearing grossly normal  Respiratory system: CTAB, diminished at bases, no wheezes, rales or rhonchi, normal respiratory effort. Cardiovascular system: normal S1/S2, RRR, no pedal edema.   Gastrointestinal system: soft, NT, ND, no HSM felt, +bowel sounds. Central nervous system: A&O x3. no gross focal neurologic deficits, normal speech Extremities: moves all, no edema, normal tone Psychiatry: normal mood, congruent affect, judgement and insight appear normal    Data Reviewed: I have personally reviewed following labs and imaging studies  CBC: Recent Labs  Lab 03/08/19 0707 03/09/19 0356 03/10/19 0319 03/11/19 0643 03/12/19 0432  WBC 7.9 7.7 6.3 7.1 5.5  HGB 9.2* 9.0* 8.0* 8.0* 7.7*  HCT 26.2* 26.7* 23.1* 22.7* 22.9*  MCV 77.1* 81.4 78.0* 77.2* 80.1  PLT 256 237 244 277 956   Basic Metabolic Panel: Recent Labs  Lab 03/09/19 0356 03/10/19 0319 03/11/19 0643 03/12/19 0432 03/12/19 1012 03/13/19 0417  NA 132* 130* 130* 135  --  137  K 4.0 4.0 3.7 3.9  --  3.5  CL 105 96* 96* 95*  --  98  CO2 15* 15* 25 21*  --  27  GLUCOSE 90  155* 146* 168*  --  228*  BUN 98* 101* 109* 81*  --  50*  CREATININE 5.94* 6.09* 6.41* 5.29*  --  4.09*  CALCIUM 7.9* 7.5* 7.3* 7.5*  --  7.6*  PHOS  --  5.0*  --   --  4.7*  --    GFR: Estimated Creatinine Clearance: 22.1 mL/min (A) (by C-G formula based on SCr of 4.09 mg/dL (H)). Liver Function Tests: Recent Labs  Lab 03/10/19 0319  ALBUMIN 1.9*   No results for input(s): LIPASE, AMYLASE in the last 168 hours. No results for input(s): AMMONIA in the last 168 hours. Coagulation Profile: No results for input(s): INR, PROTIME in the last 168 hours. Cardiac Enzymes: No results for input(s): CKTOTAL, CKMB, CKMBINDEX, TROPONINI in the last 168 hours. BNP (last 3 results) No results for input(s): PROBNP in the last 8760 hours. HbA1C: No results for input(s): HGBA1C in the last 72 hours. CBG: Recent Labs  Lab 03/12/19 0741 03/12/19 1215 03/12/19 1627 03/12/19 2058 03/13/19 0412  GLUCAP 119* 134* 184* 237* 206*   Lipid Profile: No  results for input(s): CHOL, HDL, LDLCALC, TRIG, CHOLHDL, LDLDIRECT in the last 72 hours. Thyroid Function Tests: No results for input(s): TSH, T4TOTAL, FREET4, T3FREE, THYROIDAB in the last 72 hours. Anemia Panel: Recent Labs    03/12/19 0432  RETICCTPCT 1.1   Sepsis Labs: No results for input(s): PROCALCITON, LATICACIDVEN in the last 168 hours.  Recent Results (from the past 240 hour(s))  Respiratory Panel by RT PCR (Flu A&B, Covid) - Nasopharyngeal Swab     Status: None   Collection Time: 03/03/19  3:31 PM   Specimen: Nasopharyngeal Swab  Result Value Ref Range Status   SARS Coronavirus 2 by RT PCR NEGATIVE NEGATIVE Final    Comment: (NOTE) SARS-CoV-2 target nucleic acids are NOT DETECTED. The SARS-CoV-2 RNA is generally detectable in upper respiratoy specimens during the acute phase of infection. The lowest concentration of SARS-CoV-2 viral copies this assay can detect is 131 copies/mL. A negative result does not preclude  SARS-Cov-2 infection and should not be used as the sole basis for treatment or other patient management decisions. A negative result may occur with  improper specimen collection/handling, submission of specimen other than nasopharyngeal swab, presence of viral mutation(s) within the areas targeted by this assay, and inadequate number of viral copies (<131 copies/mL). A negative result must be combined with clinical observations, patient history, and epidemiological information. The expected result is Negative. Fact Sheet for Patients:  PinkCheek.be Fact Sheet for Healthcare Providers:  GravelBags.it This test is not yet ap proved or cleared by the [place] FDA and  has been authorized for detection and/or diagnosis of SARS-CoV-2 by FDA under an Emergency Use Authorization (EUA). This EUA will remain  in effect (meaning this test can be used) for the duration of the COVID-19 declaration under Section 564(b)(1) of the Act, 21 U.S.C. section 360bbb-3(b)(1), unless the authorization is terminated or revoked sooner.    Influenza A by PCR NEGATIVE NEGATIVE Final   Influenza B by PCR NEGATIVE NEGATIVE Final    Comment: (NOTE) The Xpert Xpress SARS-CoV-2/FLU/RSV assay is intended as an aid in  the diagnosis of influenza from Nasopharyngeal swab specimens and  should not be used as a sole basis for treatment. Nasal washings and  aspirates are unacceptable for Xpert Xpress SARS-CoV-2/FLU/RSV  testing. Fact Sheet for Patients: PinkCheek.be Fact Sheet for Healthcare Providers: GravelBags.it This test is not yet approved or cleared by the [place] FDA and  has been authorized for detection and/or diagnosis of SARS-CoV-2 by  FDA under an Emergency Use Authorization (EUA). This EUA will remain  in effect (meaning this test can be used) for the duration of the  Covid-19  declaration under Section 564(b)(1) of the Act, 21  U.S.C. section 360bbb-3(b)(1), unless the authorization is  terminated or revoked. Performed at [facility], [address] [place], [place] [address]   MRSA PCR Screening     Status: None   Collection Time: 03/03/19  7:48 PM   Specimen: Nasopharyngeal  Result Value Ref Range Status   MRSA by PCR NEGATIVE NEGATIVE Final    Comment:        The GeneXpert MRSA Assay (FDA approved for NASAL specimens only), is one component of a comprehensive MRSA colonization surveillance program. It is not intended to diagnose MRSA infection nor to guide or monitor treatment for MRSA infections. Performed at [facility], [place], [place] [address]   CULTURE, BLOOD (ROUTINE X 2) w Reflex to ID Panel     Status:  None   Collection Time: 03/05/19 10:16 AM   Specimen: BLOOD  Result Value Ref Range Status   Specimen Description BLOOD LEFT ANTECUBITAL  Final   Special Requests   Final    BOTTLES DRAWN AEROBIC AND ANAEROBIC Blood Culture adequate volume   Culture   Final    NO GROWTH 5 DAYS Performed at [facility], [address] [place], [place] [address]    Report Status 03/10/2019 FINAL  Final  CULTURE, BLOOD (ROUTINE X 2) w Reflex to ID Panel     Status: None   Collection Time: 03/05/19 10:22 AM   Specimen: BLOOD  Result Value Ref Range Status   Specimen Description BLOOD BLOOD LEFT HAND  Final   Special Requests   Final    BOTTLES DRAWN AEROBIC ONLY Blood Culture adequate volume   Culture   Final    NO GROWTH 5 DAYS Performed at [facility], [address] [place], [place] [address]    Report Status 03/10/2019 FINAL  Final         Radiology Studies: PERIPHERAL VASCULAR CATHETERIZATION  Result Date: 03/11/2019 See op note       Scheduled Meds: . sodium chloride  250 mL Intravenous Once  . amiodarone  400 mg Oral BID  . aspirin EC  81 mg Oral Daily  .  chlorhexidine  15 mL Mouth Rinse BID  . Chlorhexidine Gluconate Cloth  6 each Topical Q0600  . dextromethorphan-guaiFENesin  1 tablet Oral BID  . dronabinol  5 mg Oral BID AC  . DULoxetine  30 mg Oral Daily  . ferrous sulfate  325 mg Oral BID WC  . insulin aspart  0-5 Units Subcutaneous QHS  . insulin aspart  0-9 Units Subcutaneous TID WC  . ipratropium-albuterol  3 mL Nebulization TID  . mouth rinse  15 mL Mouth Rinse q12n4p  . metoprolol tartrate  12.5 mg Oral BID  . multivitamin  1 tablet Oral QHS  . multivitamin with minerals  1 tablet Oral Daily  . nicotine  21 mg Transdermal Daily  . oxymetazoline  1 spray Each Nare BID  . polyethylene glycol  17 g Oral Daily  . Ensure Max Protein  11 oz Oral BID BM  . rosuvastatin  10 mg Oral Daily  . sodium bicarbonate  1,300 mg Oral BID  . sodium chloride flush  10-40 mL Intracatheter Q12H   Continuous Infusions: . heparin 1,650 Units/hr (03/13/19 0506)  . small volume/piggyback builder 50 mL/hr at 03/12/19 1256     LOS: 10 days    Time spent: 40 minutes    Ezekiel Slocumb, DO [facility]   If 7PM-7AM, please contact night-coverage [email or web address] 03/13/2019, 8:54 AM

## 2019-03-13 NOTE — Progress Notes (Signed)
HD TX initiated w/o complication   20/35/59 7416  Hand-Off documentation  Report given to (Full Name) Beatris Ship, RN   Vital Signs  Temp 98.1 F (36.7 C)  Temp Source Oral  Pulse Rate 77  BP 110/64  BP Location Left Arm  BP Method Automatic  Patient Position (if appropriate) Lying  Oxygen Therapy  SpO2 100 %  O2 Device Nasal Cannula  O2 Flow Rate (L/min) 2 L/min  Pulse Oximetry Type Continuous  Pain Assessment  Pain Scale 0-10  Pain Score 0  Dialysis Weight  Weight 94.5 kg  Type of Weight Pre-Dialysis  Time-Out for Hemodialysis  What Procedure? HD  Pt Identifiers(min of two) First/Last Name;MRN/Account#  Correct Site? Yes  Correct Side? Yes  Correct Procedure? Yes  Consents Verified? Yes  Rad Studies Available? N/A  Engineer, civil (consulting) Number 6  Station Number 3  UF/Alarm Test Passed  Conductivity: Meter 14  Conductivity: Machine  14  pH 7.2  Reverse Osmosis Main  Normal Saline Lot Number [id number]  Dialyzer Lot Number [id number]  Disposable Set Lot Number [id number]  Machine Temperature 98.6 F (37 C)  Musician and Audible Yes  Blood Lines Intact and Secured Yes  Pre Treatment Patient Checks  Vascular access used during treatment Catheter  HD catheter dressing before treatment WDL  Hepatitis B Surface Antigen Results Negative  Date Hepatitis B Surface Antigen Drawn 03/11/19  Hepatitis B Surface Antibody  (<10)  Date Hepatitis B Surface Antibody Drawn 03/11/19  Hemodialysis Consent Verified Yes  Hemodialysis Standing Orders Initiated Yes  ECG (Telemetry) Monitor On Yes  Prime Ordered Normal Saline  Length of  DialysisTreatment -hour(s) 3 Hour(s)  Dialysis Treatment Comments Na 140  Dialyzer Elisio 17H NR  Dialysate 3K;2.5 Ca  Dialysate Flow Ordered 600  Blood Flow Rate Ordered 300 mL/min  Ultrafiltration Goal 0.5 Liters  Pre Treatment Labs Phosphorus  Dialysis Blood Pressure Support Ordered Normal Saline  During Hemodialysis Assessment   Blood Flow Rate (mL/min) 300 mL/min  Arterial Pressure (mmHg) -120 mmHg  Venous Pressure (mmHg) 110 mmHg  Transmembrane Pressure (mmHg) 60 mmHg  Ultrafiltration Rate (mL/min) 160 mL/min  Dialysate Flow Rate (mL/min) 600 ml/min  Conductivity: Machine  13.7  HD Safety Checks Performed Yes  Dialysis Fluid Bolus Normal Saline  Bolus Amount (mL) 250 mL  Intra-Hemodialysis Comments Tx initiated  Education / Care Plan  Dialysis Education Provided Yes  Documented Education in Care Plan Yes  Hemodialysis Catheter Right Femoral vein Triple lumen Temporary (Non-Tunneled)  Placement Date/Time: 03/11/19 1532   Time Out: Correct patient;Correct site;Correct procedure  Maximum sterile barrier precautions: Hand hygiene;Cap;Mask;Sterile gown;Sterile gloves;Large sterile sheet  Site Prep: Chlorhexidine (preferred)  Local Anes...  Site Condition No complications  Blue Lumen Status Blood return noted  Red Lumen Status Blood return noted  Purple Lumen Status Capped (Central line)  Dressing Type Biopatch;Occlusive  Dressing Status Clean;Dry;Intact

## 2019-03-13 NOTE — Progress Notes (Signed)
Patient scheduled for dialysis today; dialysis personnel updated; transporter arrived to take pt to dialysis. Around 4 am, pt's right groin trialysis catheter dressing was noted to be saturated, with blood seeping from underneath dressing. Gauze that was wrapped around ports (blue and red) was also saturated. Dressing removed and site cleaned with CHG. No bleeding from catheter site noted, but suture site oozing. No hematoma noted, no complaints from patient.

## 2019-03-13 NOTE — Progress Notes (Signed)
Was told in report that nurse stopped heparin drip around 2 pm because patient was bleeding from Right femoral cath site. The heparin drip was not stopped in the chart and there was no order to discontinue. Patient is no longer bleeding from the site at this time.  I called Pharmacy to figure out what to do and he suggested I contact covering provider to see if the heparin drip is still necessary or if patient can transition to oral. Sharion Settler, NP notified.  I also tried to get patient to sign informed consent but he says he was never told about the procedure.

## 2019-03-13 NOTE — Consult Note (Signed)
ANTICOAGULATION CONSULT NOTE  Pharmacy Consult for Heparin  Indication: atrial fibrillation- new onset  Patient Measurements: Height: 6' (182.9 cm) Weight: 208 lb 5.4 oz (94.5 kg) IBW/kg (Calculated) : 77.6 Heparin Dosing Weight: 86.2 kg   Vital Signs: Temp: 97.8 F (36.6 C) (03/10 1059) Temp Source: Oral (03/10 1059) BP: 120/62 (03/10 1059) Pulse Rate: 84 (03/10 1059)  Labs: Recent Labs    03/11/19 0643 03/11/19 1949 03/12/19 0432 03/13/19 0417 03/13/19 1149  HGB 8.0*  --  7.7*  --  8.2*  HCT 22.7*  --  22.9*  --  25.0*  PLT 277  --  244  --  277  HEPARINUNFRC 0.70   < > 0.37 0.30 0.42  CREATININE 6.41*  --  5.29* 4.09*  --    < > = values in this interval not displayed.    Estimated Creatinine Clearance: 22.1 mL/min (A) (by C-G formula based on SCr of 4.09 mg/dL (H)).  Medications:  Called RN to confirm whether pt has been on [facility] prior to admission. Nurse was not able to confirm as she was not in the patient's room. Per chart review, no AC prior to admission and confirmed with pharmacy technician who spoke to patient.   Heparin Course (since 3/9): 3/9 @ 0432 HL 0.37, therapeutic X1 3/10 @ 0417 HL 0.30, therapeutic X2 3/10 @ 1149 HL 0.42, therapeutic X 3  Assessment: Craig Koch is a 64 y.o. male with a past medical history of anxiety, CAD, diabetes, and hyperlipidemia, hypertension, lung cancer, presents to the emergency department for shortness of breath. Pharmacy was consulted for heparin dosing in a patient with new onset atrial fibrillation. H&H trending down slightly, platelets are stable   Patient is s/p 1 unit PRBC with HD today  Goal of Therapy:  Heparin level 0.3-0.7 units/ml Monitor platelets by anticoagulation protocol: Yes   Plan:  -03/10 @ 1149 HL 0.42 therapeutic x 3, continue current rate at 1650 units/hr -Heparin level tomorrow with AM labs -CBC tomorrow AM   [place] Resident 03/13/2019 12:57 PM

## 2019-03-13 NOTE — Progress Notes (Signed)
Progress Note  Patient Name: Craig Koch Date of Encounter: 03/13/2019  Primary Cardiologist: Rockey Situ  Subjective   Seen in HD. Receiving pRBC currently.  Renal function improving.  Remains in sinus rhythm.  Dyspnea slightly worse this morning. No chest pain or palpitations.   Inpatient Medications    Scheduled Meds: . sodium chloride  250 mL Intravenous Once  . acetaminophen  650 mg Oral Once  . amiodarone  400 mg Oral BID  . aspirin EC  81 mg Oral Daily  . chlorhexidine  15 mL Mouth Rinse BID  . Chlorhexidine Gluconate Cloth  6 each Topical Q0600  . dextromethorphan-guaiFENesin  1 tablet Oral BID  . diphenhydrAMINE  25 mg Oral Once  . dronabinol  5 mg Oral BID AC  . DULoxetine  30 mg Oral Daily  . ferrous sulfate  325 mg Oral BID WC  . insulin aspart  0-5 Units Subcutaneous QHS  . insulin aspart  0-9 Units Subcutaneous TID WC  . ipratropium-albuterol  3 mL Nebulization TID  . mouth rinse  15 mL Mouth Rinse q12n4p  . metoprolol tartrate  12.5 mg Oral BID  . multivitamin  1 tablet Oral QHS  . multivitamin with minerals  1 tablet Oral Daily  . nicotine  21 mg Transdermal Daily  . oxymetazoline  1 spray Each Nare BID  . polyethylene glycol  17 g Oral Daily  . Ensure Max Protein  11 oz Oral BID BM  . rosuvastatin  10 mg Oral Daily  . sodium bicarbonate  1,300 mg Oral BID  . sodium chloride flush  10-40 mL Intracatheter Q12H   Continuous Infusions: . heparin 1,650 Units/hr (03/13/19 0506)  . small volume/piggyback builder 50 mL/hr at 03/12/19 1256   PRN Meds: acetaminophen, albuterol, dextrose, heparin lock flush, heparin lock flush, menthol-cetylpyridinium, ondansetron (ZOFRAN) IV, pneumococcal 23 valent vaccine, sodium chloride, sodium chloride flush, sodium chloride flush   Vital Signs    Vitals:   03/13/19 0649 03/13/19 0700 03/13/19 0715 03/13/19 0730  BP: 110/64 96/68 127/60 130/60  Pulse: 77 75 90 87  Resp:  (!) 22 (!) 22 20  Temp: 98.1 F (36.7 C)      TempSrc: Oral     SpO2: 100% 100% 93% 95%  Weight: 94.5 kg     Height:        Intake/Output Summary (Last 24 hours) at 03/13/2019 0823 Last data filed at 03/13/2019 0118 Gross per 24 hour  Intake 240 ml  Output 400 ml  Net -160 ml   Filed Weights   03/12/19 1134 03/13/19 0401 03/13/19 0649  Weight: 94.6 kg 94.5 kg 94.5 kg    Telemetry    SR with sinus arrhythmia and 1st degree AVB - Personally Reviewed  ECG    No new tracings - Personally Reviewed  Physical Exam   GEN: Chronically ill appearing; No acute distress.   Neck: No JVD. Cardiac: RRR, I/VI systolic murmur, no rubs, or gallops.  Respiratory: Diminished breath sounds bilaterally at the bases.  GI: Soft, nontender, non-distended.   MS: 1+ bilateral lower extremity and right upper extremity edema; No deformity. Neuro:  Alert and oriented x 3; Nonfocal.  Psych: Normal affect.  Labs    Chemistry Recent Labs  Lab 03/10/19 0319 03/10/19 0319 03/11/19 9983 03/12/19 0432 03/13/19 0417  NA 130*   < > 130* 135 137  K 4.0   < > 3.7 3.9 3.5  CL 96*   < > 96* 95* 98  CO2 15*   < > 25 21* 27  GLUCOSE 155*   < > 146* 168* 228*  BUN 101*   < > 109* 81* 50*  CREATININE 6.09*   < > 6.41* 5.29* 4.09*  CALCIUM 7.5*   < > 7.3* 7.5* 7.6*  ALBUMIN 1.9*  --   --   --   --   GFRNONAA 9*   < > 8* 11* 15*  GFRAA 10*   < > 10* 12* 17*  ANIONGAP 19*   < > 9 19* 12   < > = values in this interval not displayed.     Hematology Recent Labs  Lab 03/10/19 0319 03/10/19 0319 03/11/19 0643 03/12/19 0432  WBC 6.3  --  7.1 5.5  RBC 2.96*   < > 2.94* 2.86*  2.95*  HGB 8.0*  --  8.0* 7.7*  HCT 23.1*  --  22.7* 22.9*  MCV 78.0*  --  77.2* 80.1  MCH 27.0  --  27.2 26.9  MCHC 34.6  --  35.2 33.6  RDW 19.0*  --  18.5* 19.3*  PLT 244  --  277 244   < > = values in this interval not displayed.    Cardiac EnzymesNo results for input(s): TROPONINI in the last 168 hours. No results for input(s): TROPIPOC in the last 168 hours.    BNPNo results for input(s): BNP, PROBNP in the last 168 hours.   DDimer No results for input(s): DDIMER in the last 168 hours.   Radiology    US RENAL  Result Date: 03/10/2019 IMPRESSION: 1. Mildly echogenic renal parenchyma indicative of medical renal disease. 2. Otherwise normal examination. Specifically, no evidence of urinary tract obstruction to account for the acute renal insufficiency. Electronically Signed   By: Evangeline Dakin M.D.   On: 03/10/2019 15:02    Cardiac Studies   2D echo 03/04/2019: 1. Left ventricular ejection fraction, by estimation, is 30 to 35%. The  left ventricle has moderately decreased function. The left ventricle  demonstrates global hypokinesis. Left ventricular diastolic parameters are  indeterminate.  2. Right ventricular systolic function is normal. The right ventricular  size is normal. Tricuspid regurgitation signal is inadequate for assessing  PA pressure.  3. Left atrial size was mild to moderately dilated.  4. The mitral valve is normal in structure and function. Moderate mitral  valve regurgitation. No evidence of mitral stenosis.  5. The aortic valve is normal in structure and function. Aortic valve  regurgitation is trivial. Mild to moderate aortic valve  sclerosis/calcification is present, without any evidence of aortic  stenosis.  6. The inferior vena cava is normal in size with greater than 50%  respiratory variability, suggesting right atrial pressure of 3 mmHg.   Patient Profile     64 y.o. 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  Assessment & Plan    1. CAD s/p CABG with NSTEMI: -Markedly elevated high-sensitivity troponin greater than 27,000 in the setting of known CAD with A. fib with RVR and volume overload at presentation  -Remains on heparin drip as outlined  below -Currently, not a good candidate for invasive ischemic evaluation given acute illness with severe comorbid conditions including acute renal failure -Consider addition of Plavix for medical management with possible discontinuation of aspirin prior to discharge if he is to be placed on  [place] given his A. Fib if comorbid conditions allow -ASA -Crestor  2. PAF with RVR: -He remains in sinus rhythm, high risk for recurrent arrhythmia  -Blood pressures remain low -Continue p.o. amiodarone 400 mg twice daily for 1 more day, then decrease to 200 mg daily for 1 week, then 200 mg daily thereafter -Continue low-dose metoprolol and heparin drip -CHADS2VASc at least 4 (CHF, HTN, DM, vascular disease) -Prior to discharge will need to evaluate his status for possible OAC given his comorbid conditions  2. HFrEF: -He remains volume overloaded, which may be contributing to his kidney disease -Volume currently being managed with HD -Continue metoprolol if SBP > 100 mmHg, with plans to add Imdur/hydralazine discharge pending kidney function -BP precludes transition to Coreg -Renal function precludes addition of ACEi/ARB/spironolactone/Entresto -Currently, not a good candidate for invasive ischemic evaluation given his new onset cardiomyopathy as outlined above -CHF education -Strict I/O  3. ARF: -Started on HD this admission following worsening of renal function, which is now improving with HD -Bladder scan as above -Per nephrology   4. Stage IV lung cancer with malignant pleural effusions and COPD with prior tobacco use: -Oncology following -Complete tobacco cessation encouraged  5.  HLD: -Continue Crestor  6. Anemia: -Packed RBC transfusion with HD today -Continue close monitoring since he is on a heparin drip  7. Dyspnea: -Multifactorial including volume overload with him being + 10 L for the admission currently managed by HD, anemia with HGB 7.7 currently scheduled to receive pRBC,  stage IV lung cancer, and physical deconditioning  -Less likely PE given he has been anticoagulated with heparin  -Management as above   For questions or updates, please contact [facility] Please consult [email or web address] for contact info under Cardiology/STEMI.    Signed, Christell Faith, PA-C [place] Pager: [phone] 03/13/2019, 8:23 AM

## 2019-03-13 NOTE — Progress Notes (Signed)
PT Cancellation Note  Patient Details Name: Craig Koch MRN: [id number] DOB: December 12, 1955   Cancelled Treatment:      PT attempt. 2nd attempt today. Pt politely refused stating he is having pain from  vascular accessRight femoralvein procedure. Pt requested therapist return in morning. PT will continue to follow per POC.    Willette Pa 03/13/2019, 3:47 PM

## 2019-03-13 NOTE — Consult Note (Signed)
[place] for Heparin  Indication: atrial fibrillation- new onset  Patient Measurements: Height: 6' (182.9 cm) Weight: 208 lb 5.4 oz (94.5 kg) IBW/kg (Calculated) : 77.6 Heparin Dosing Weight: 86.2 kg   Vital Signs: Temp: 97.5 F (36.4 C) (03/10 1957) Temp Source: Oral (03/10 1957) BP: 117/55 (03/10 1957) Pulse Rate: 78 (03/10 1957)  Labs: Recent Labs    03/11/19 0643 03/11/19 1949 03/12/19 0432 03/13/19 0417 03/13/19 1149  HGB 8.0*  --  7.7*  --  8.2*  HCT 22.7*  --  22.9*  --  25.0*  PLT 277  --  244  --  277  HEPARINUNFRC 0.70   < > 0.37 0.30 0.42  CREATININE 6.41*  --  5.29* 4.09*  --    < > = values in this interval not displayed.    Estimated Creatinine Clearance: 22.1 mL/min (A) (by C-G formula based on SCr of 4.09 mg/dL (H)).  Medications:  Called RN to confirm whether pt has been on [facility] prior to admission. Nurse was not able to confirm as she was not in the patient's room. Per chart review, no AC prior to admission and confirmed with pharmacy technician who spoke to patient.   Heparin Course (since 3/9): 3/9 @ 0432 HL 0.37, therapeutic X1 3/10 @ 0417 HL 0.30, therapeutic X2 3/10 @ 1149 HL 0.42, therapeutic X 3  Assessment: Craig Koch is a 64 y.o. male with a past medical history of anxiety, CAD, diabetes, and hyperlipidemia, hypertension, lung cancer, presents to the emergency department for shortness of breath. Pharmacy was consulted for heparin dosing in a patient with new onset atrial fibrillation. H&H trending down slightly, platelets are stable   Patient is s/p 1 unit PRBC with HD today  Goal of Therapy:  Heparin level 0.3-0.7 units/ml Monitor platelets by anticoagulation protocol: Yes   Plan:  03/10 @ 2200 RN calling saying patient had a bleed from catheter and heparin drip was stopped, spoke to RN and asked her to touch base w/ provider to see if drip can be discontinued and transitioned to PO since patient  wasn't in afib last two EKGs. RN called provider and provider wanted to continue drip. Will restart drip at 1600 units/hr and will check HL w/ am labs and continue to monitor CBC's and will recommend blood administration for hgb < 7.0 mg/dL.  Tobie Lords, PharmD, BCPS Clinical Pharmacist 03/13/2019 10:17 PM

## 2019-03-13 NOTE — Progress Notes (Signed)
Per Craig Koch continue the heparin drip. Pharmacist notified and rate adjusted. Heparin drip restarted. Will continue to monitor.

## 2019-03-13 NOTE — Progress Notes (Signed)
PT Cancellation Note  Patient Details Name: Craig Koch MRN: [id number] DOB: 1955-07-03   Cancelled Treatment:     PT attempt. Pt currently having HD. Therapist will return later per POC.    Willette Pa 03/13/2019, 10:19 AM

## 2019-03-13 NOTE — Plan of Care (Signed)

## 2019-03-14 ENCOUNTER — Inpatient Hospital Stay: Payer: Medicare Other

## 2019-03-14 ENCOUNTER — Encounter: Payer: Self-pay | Admitting: Cardiology

## 2019-03-14 ENCOUNTER — Encounter
Admission: EM | Disposition: A | Discharge status: Skilled Nursing Facility | Payer: Self-pay | Source: Home / Self Care | Attending: Internal Medicine

## 2019-03-14 DIAGNOSIS — N185 Chronic kidney disease, stage 5: Secondary | ICD-10-CM

## 2019-03-14 DIAGNOSIS — N170 Acute kidney failure with tubular necrosis: Secondary | ICD-10-CM

## 2019-03-14 HISTORY — PX: DIALYSIS/PERMA CATHETER INSERTION: CATH118288

## 2019-03-14 LAB — BLOOD GAS, ARTERIAL
Acid-base deficit: 1.9 mmol/L (ref 0.0–2.0)
Bicarbonate: 22.2 mmol/L (ref 20.0–28.0)
FIO2: 0.28
O2 Saturation: 96.1 %
Patient temperature: 37
pCO2 arterial: 35 mmHg (ref 32.0–48.0)
pH, Arterial: 7.41 (ref 7.350–7.450)
pO2, Arterial: 82 mmHg — ABNORMAL LOW (ref 83.0–108.0)

## 2019-03-14 LAB — QUANTIFERON-TB GOLD PLUS (RQFGPL)
QuantiFERON Mitogen Value: 0.18 IU/mL
QuantiFERON Nil Value: 0.02 IU/mL
QuantiFERON TB1 Ag Value: 0.02 IU/mL
QuantiFERON TB2 Ag Value: 0.02 IU/mL

## 2019-03-14 LAB — TYPE AND SCREEN
ABO/RH(D): O POS
Antibody Screen: NEGATIVE
Unit division: 0

## 2019-03-14 LAB — BASIC METABOLIC PANEL
Anion gap: 15 (ref 5–15)
BUN: 33 mg/dL — ABNORMAL HIGH (ref 8–23)
CO2: 24 mmol/L (ref 22–32)
Calcium: 8 mg/dL — ABNORMAL LOW (ref 8.9–10.3)
Chloride: 96 mmol/L — ABNORMAL LOW (ref 98–111)
Creatinine, Ser: 3.17 mg/dL — ABNORMAL HIGH (ref 0.61–1.24)
GFR calc Af Amer: 23 mL/min — ABNORMAL LOW (ref >60)
GFR calc non Af Amer: 20 mL/min — ABNORMAL LOW (ref >60)
Glucose, Bld: 265 mg/dL — ABNORMAL HIGH (ref 70–99)
Potassium: 4.5 mmol/L (ref 3.5–5.1)
Sodium: 135 mmol/L (ref 135–145)

## 2019-03-14 LAB — TROPONIN I (HIGH SENSITIVITY)
Troponin I (High Sensitivity): 398 ng/L (ref <18)
Troponin I (High Sensitivity): 390 ng/L (ref <18)

## 2019-03-14 LAB — CBC
HCT: 29 % — ABNORMAL LOW (ref 39.0–52.0)
Hemoglobin: 9.7 g/dL — ABNORMAL LOW (ref 13.0–17.0)
MCH: 28 pg (ref 26.0–34.0)
MCHC: 33.4 g/dL (ref 30.0–36.0)
MCV: 83.8 fL (ref 80.0–100.0)
Platelets: 302 10*3/uL (ref 150–400)
RBC: 3.46 MIL/uL — ABNORMAL LOW (ref 4.22–5.81)
RDW: 18.6 % — ABNORMAL HIGH (ref 11.5–15.5)
WBC: 6.9 10*3/uL (ref 4.0–10.5)
nRBC: 0 % (ref 0.0–0.2)

## 2019-03-14 LAB — PHOSPHORUS: Phosphorus: 2.8 mg/dL (ref 2.5–4.6)

## 2019-03-14 LAB — GLUCOSE, CAPILLARY
Glucose-Capillary: 152 mg/dL — ABNORMAL HIGH (ref 70–99)
Glucose-Capillary: 171 mg/dL — ABNORMAL HIGH (ref 70–99)
Glucose-Capillary: 173 mg/dL — ABNORMAL HIGH (ref 70–99)
Glucose-Capillary: 196 mg/dL — ABNORMAL HIGH (ref 70–99)
Glucose-Capillary: 223 mg/dL — ABNORMAL HIGH (ref 70–99)
Glucose-Capillary: 235 mg/dL — ABNORMAL HIGH (ref 70–99)
Glucose-Capillary: 241 mg/dL — ABNORMAL HIGH (ref 70–99)

## 2019-03-14 LAB — BPAM RBC
Blood Product Expiration Date: 202103132359
ISSUE DATE / TIME: 202103100827
Unit Type and Rh: 5100

## 2019-03-14 LAB — QUANTIFERON-TB GOLD PLUS: QuantiFERON-TB Gold Plus: UNDETERMINED — AB

## 2019-03-14 LAB — HEPARIN LEVEL (UNFRACTIONATED)
Heparin Unfractionated: 0.28 IU/mL — ABNORMAL LOW (ref 0.30–0.70)
Heparin Unfractionated: 0.78 IU/mL — ABNORMAL HIGH (ref 0.30–0.70)

## 2019-03-14 SURGERY — DIALYSIS/PERMA CATHETER INSERTION
Anesthesia: Choice

## 2019-03-14 MED ORDER — FENTANYL CITRATE (PF) 100 MCG/2ML IJ SOLN
INTRAMUSCULAR | Status: DC | PRN
  Administered 2019-03-14: 50 ug via INTRAVENOUS

## 2019-03-14 MED ORDER — PIPERACILLIN-TAZOBACTAM 3.375 G IVPB
3.3750 g | Freq: Two times a day (BID) | INTRAVENOUS | Status: AC
  Administered 2019-03-14 – 2019-03-19 (×10): 3.375 g via INTRAVENOUS
  Filled 2019-03-14 (×9): qty 50

## 2019-03-14 MED ORDER — VANCOMYCIN HCL 2000 MG/400ML IV SOLN
2000.0000 mg | Freq: Once | INTRAVENOUS | Status: AC
  Administered 2019-03-14: 2000 mg via INTRAVENOUS
  Filled 2019-03-14: qty 400

## 2019-03-14 MED ORDER — AMIODARONE HCL 200 MG PO TABS
400.0000 mg | ORAL_TABLET | Freq: Once | ORAL | Status: AC
  Administered 2019-03-14: 400 mg via ORAL
  Filled 2019-03-14: qty 2

## 2019-03-14 MED ORDER — MIDAZOLAM HCL 5 MG/5ML IJ SOLN
INTRAMUSCULAR | Status: AC
  Filled 2019-03-14: qty 5

## 2019-03-14 MED ORDER — CEFAZOLIN SODIUM-DEXTROSE 1-4 GM/50ML-% IV SOLN
1.0000 g | Freq: Once | INTRAVENOUS | Status: AC
  Administered 2019-03-14: 1 g via INTRAVENOUS

## 2019-03-14 MED ORDER — AMIODARONE HCL 200 MG PO TABS
200.0000 mg | ORAL_TABLET | Freq: Two times a day (BID) | ORAL | Status: AC
  Administered 2019-03-15 – 2019-03-18 (×7): 200 mg via ORAL
  Filled 2019-03-14 (×7): qty 1

## 2019-03-14 MED ORDER — SODIUM CHLORIDE 0.9 % IV SOLN: INTRAVENOUS | Status: DC

## 2019-03-14 MED ORDER — LORAZEPAM 2 MG/ML IJ SOLN
0.5000 mg | Freq: Once | INTRAMUSCULAR | Status: AC
  Administered 2019-03-14: 2 mg via INTRAVENOUS
  Filled 2019-03-14: qty 0.25

## 2019-03-14 MED ORDER — ONDANSETRON HCL 4 MG/2ML IJ SOLN: 4.0000 mg | Freq: Four times a day (QID) | INTRAMUSCULAR | Status: DC | PRN

## 2019-03-14 MED ORDER — INSULIN DETEMIR 100 UNIT/ML ~~LOC~~ SOLN
20.0000 [IU] | Freq: Every day | SUBCUTANEOUS | Status: DC
  Administered 2019-03-14 – 2019-03-18 (×5): 20 [IU] via SUBCUTANEOUS
  Filled 2019-03-14 (×6): qty 0.2

## 2019-03-14 MED ORDER — FENTANYL CITRATE (PF) 100 MCG/2ML IJ SOLN
INTRAMUSCULAR | Status: AC
  Filled 2019-03-14: qty 2

## 2019-03-14 MED ORDER — APIXABAN 5 MG PO TABS
5.0000 mg | ORAL_TABLET | Freq: Two times a day (BID) | ORAL | Status: DC
  Administered 2019-03-14 – 2019-03-19 (×10): 5 mg via ORAL
  Filled 2019-03-14 (×11): qty 1

## 2019-03-14 MED ORDER — FAMOTIDINE 40 MG PO TABS
40.0000 mg | ORAL_TABLET | Freq: Once | ORAL | Status: DC | PRN
  Filled 2019-03-14: qty 1

## 2019-03-14 MED ORDER — HYDROMORPHONE HCL 1 MG/ML IJ SOLN: 1.0000 mg | Freq: Once | INTRAMUSCULAR | Status: DC | PRN

## 2019-03-14 MED ORDER — PIPERACILLIN-TAZOBACTAM 3.375 G IVPB
3.3750 g | Freq: Three times a day (TID) | INTRAVENOUS | Status: DC
  Filled 2019-03-14: qty 50

## 2019-03-14 MED ORDER — MIDAZOLAM HCL 2 MG/ML PO SYRP: 8.0000 mg | ORAL_SOLUTION | Freq: Once | ORAL | Status: DC | PRN

## 2019-03-14 MED ORDER — FUROSEMIDE 10 MG/ML IJ SOLN
60.0000 mg | INTRAMUSCULAR | Status: AC
  Administered 2019-03-14 (×2): 60 mg via INTRAVENOUS
  Filled 2019-03-14 (×2): qty 6

## 2019-03-14 MED ORDER — AMIODARONE HCL 200 MG PO TABS
200.0000 mg | ORAL_TABLET | Freq: Every day | ORAL | Status: DC
  Administered 2019-03-19: 200 mg via ORAL
  Filled 2019-03-14: qty 1

## 2019-03-14 MED ORDER — DIPHENHYDRAMINE HCL 50 MG/ML IJ SOLN: 50.0000 mg | Freq: Once | INTRAMUSCULAR | Status: DC | PRN

## 2019-03-14 MED ORDER — MIDAZOLAM HCL 2 MG/2ML IJ SOLN
INTRAMUSCULAR | Status: DC | PRN
  Administered 2019-03-14: 2 mg via INTRAVENOUS

## 2019-03-14 MED ORDER — METHYLPREDNISOLONE SODIUM SUCC 125 MG IJ SOLR: 125.0000 mg | Freq: Once | INTRAMUSCULAR | Status: DC | PRN

## 2019-03-14 SURGICAL SUPPLY — 6 items
ADH SKN CLS APL DERMABOND .7 (GAUZE/BANDAGES/DRESSINGS) ×1
CATH PALINDROME RT-P 15FX19CM (CATHETERS) ×2 IMPLANT
DERMABOND ADVANCED (GAUZE/BANDAGES/DRESSINGS) ×2
DERMABOND ADVANCED .7 DNX12 (GAUZE/BANDAGES/DRESSINGS) IMPLANT
SUT MNCRL AB 4-0 PS2 18 (SUTURE) ×2 IMPLANT
SUT PROLENE 0 CT 1 30 (SUTURE) ×2 IMPLANT

## 2019-03-14 NOTE — Progress Notes (Signed)
Progress Note  Patient Name: Craig Koch Date of Encounter: 03/14/2019  Primary Cardiologist:Dr. Rockey Situ  Subjective   Seen in HD. He reports that nephrology has been in touch with his wife. He remains in NSR with rare ectopy. Work with respiration noted and patient reports he is chronically SOB and that it is unchanged from yesterday. No chest pain, palpitations, or racing HR. He reports that he just wants "to get some of this fluid off of me and finish HD."  Inpatient Medications    Scheduled Meds: . sodium chloride  250 mL Intravenous Once  . amiodarone  400 mg Oral BID  . aspirin EC  81 mg Oral Daily  . chlorhexidine  15 mL Mouth Rinse BID  . Chlorhexidine Gluconate Cloth  6 each Topical Q0600  . dextromethorphan-guaiFENesin  1 tablet Oral BID  . dronabinol  5 mg Oral BID AC  . DULoxetine  30 mg Oral Daily  . ferrous sulfate  325 mg Oral BID WC  . insulin aspart  0-5 Units Subcutaneous QHS  . insulin aspart  0-9 Units Subcutaneous TID WC  . ipratropium-albuterol  3 mL Nebulization TID  . mouth rinse  15 mL Mouth Rinse q12n4p  . metoprolol tartrate  12.5 mg Oral BID  . multivitamin  1 tablet Oral QHS  . multivitamin with minerals  1 tablet Oral Daily  . nicotine  21 mg Transdermal Daily  . oxymetazoline  1 spray Each Nare BID  . polyethylene glycol  17 g Oral Daily  . Ensure Max Protein  11 oz Oral BID BM  . rosuvastatin  10 mg Oral Daily  . sodium bicarbonate  1,300 mg Oral BID  . sodium chloride flush  10-40 mL Intracatheter Q12H   Continuous Infusions: . sodium chloride    .  ceFAZolin (ANCEF) IV    . heparin 1,650 Units/hr (03/14/19 0640)  . small volume/piggyback builder Stopped (03/13/19 1957)   PRN Meds: acetaminophen, albuterol, dextrose, diphenhydrAMINE, famotidine, heparin lock flush, heparin lock flush, HYDROmorphone (DILAUDID) injection, menthol-cetylpyridinium, methylPREDNISolone (SOLU-MEDROL) injection, midazolam, ondansetron (ZOFRAN) IV,  ondansetron (ZOFRAN) IV, pneumococcal 23 valent vaccine, sodium chloride, sodium chloride flush, sodium chloride flush   Vital Signs    Vitals:   03/14/19 1130 03/14/19 1145 03/14/19 1200 03/14/19 1215  BP: (!) 122/59 (!) 124/55 (!) 113/57 (!) 117/56  Pulse: 87 86 83 84  Resp: (!) 23 (!) 29 (!) 23 20  Temp:      TempSrc:      SpO2:      Weight:      Height:        Intake/Output Summary (Last 24 hours) at 03/14/2019 1226 Last data filed at 03/14/2019 0954 Gross per 24 hour  Intake 537.09 ml  Output 1125 ml  Net -587.91 ml   Last 3 Weights 03/14/2019 03/13/2019 03/13/2019  Weight (lbs) 210 lb 9.6 oz 208 lb 5.4 oz 208 lb 5.4 oz  Weight (kg) 95.528 kg 94.5 kg 94.5 kg      Telemetry    NSR with 1st degree AVB, rare ectopy - Personally Reviewed  ECG    No new tracings- Personally Reviewed  Physical Exam   GEN: Frail and cachetic male.   Neck: No JVD Cardiac: RRR, 1/6 systolic murmur. No rubs or gallops.  Respiratory: Bilateral wheezing noted. Bibasilar reduced breath sounds. GI: Soft, nontender, non-distended  MS: mild to moderate bilateral non-pitting edema, mild bilateral upper extremity edema, worse on the R; No deformity. Neuro:  Nonfocal  Psych: Normal affect   Labs    High Sensitivity Troponin:   Recent Labs  Lab 03/03/19 1547 03/03/19 1750  TROPONINIHS 13,356* >27,000*      Chemistry Recent Labs  Lab 03/10/19 0319 03/11/19 9518 03/12/19 0432 03/13/19 0417 03/14/19 0520  NA 130*   < > 135 137 135  K 4.0   < > 3.9 3.5 4.5  CL 96*   < > 95* 98 96*  CO2 15*   < > 21* 27 24  GLUCOSE 155*   < > 168* 228* 265*  BUN 101*   < > 81* 50* 33*  CREATININE 6.09*   < > 5.29* 4.09* 3.17*  CALCIUM 7.5*   < > 7.5* 7.6* 8.0*  ALBUMIN 1.9*  --   --   --   --   GFRNONAA 9*   < > 11* 15* 20*  GFRAA 10*   < > 12* 17* 23*  ANIONGAP 19*   < > 19* 12 15   < > = values in this interval not displayed.     Hematology Recent Labs  Lab 03/12/19 0432 03/13/19 1149  03/14/19 0520  WBC 5.5 6.3 6.9  RBC 2.86*  2.95* 2.96* 3.46*  HGB 7.7* 8.2* 9.7*  HCT 22.9* 25.0* 29.0*  MCV 80.1 84.5 83.8  MCH 26.9 27.7 28.0  MCHC 33.6 32.8 33.4  RDW 19.3* 18.6* 18.6*  PLT 244 277 302    BNPNo results for input(s): BNP, PROBNP in the last 168 hours.   DDimer No results for input(s): DDIMER in the last 168 hours.   Radiology    DG Chest 1 View  Result Date: 03/14/2019 CLINICAL DATA:  Wheezing EXAM: CHEST  1 VIEW COMPARISON:  03/03/2019 FINDINGS: Borderline heart size.  CABG. Diffuse interstitial opacity with Kerley lines and left more than right pleural effusion. No pneumothorax. IMPRESSION: CHF. Electronically Signed   By: Monte Fantasia M.D.   On: 03/14/2019 05:47    Cardiac Studies   2D echo 03/04/2019: 1. Left ventricular ejection fraction, by estimation, is 30 to 35%. The  left ventricle has moderately decreased function. The left ventricle  demonstrates global hypokinesis. Left ventricular diastolic parameters are  indeterminate.  2. Right ventricular systolic function is normal. The right ventricular  size is normal. Tricuspid regurgitation signal is inadequate for assessing  PA pressure.  3. Left atrial size was mild to moderately dilated.  4. The mitral valve is normal in structure and function. Moderate mitral  valve regurgitation. No evidence of mitral stenosis.  5. The aortic valve is normal in structure and function. Aortic valve  regurgitation is trivial. Mild to moderate aortic valve  sclerosis/calcification is present, without any evidence of aortic  stenosis.  6. The inferior vena cava is normal in size with greater than 50%  respiratory variability, suggesting right atrial pressure of 3 mmHg.   Patient Profile     64 y.o. 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 fib with RVR, and  NSTEMI.  Assessment & Plan    CAD s/p CABG with non-ST elevation myocardial infarction --No current chest pain.  High-sensitivity Tn elevation greater than 27,000 in the setting of known CAD with rapid ventricular rate and A. fib, as well as volume overload at presentation.  Continue IV heparin.  Given his comorbid conditions, including acute renal failure, he is not a good  candidate for ischemic evaluation.  Agree with previous recommendation to consider the addition of Plavix for medical management with possible discontinuation of aspirin prior to discharge, as the current plan is to place him on [facility] given his A. fib and if comorbid conditions allow.  Continue statin for risk factor modification.  Paroxysmal atrial fibrillation with RVR --No racing heart rate or palpitations.  He remains in NSR with ectopy seen on telemetry during HD.  He is at high risk for recurrent arrhythmia.  Continue p.o. amiodarone 400 mg for 1 more dose to achieve a 10g load, as he missed a dose of amiodarone 400 mg on 03/11/2019.  Following this amiodarone 400 mg x1, we will drop him down to amiodarone 200mg  BID for 7 days, then 200mg  daily thereafter. Continue BB for rate control with telemetry rates currently well controlled. Continue IV heparin with plan to transition him to Eliquis at discharge given CHA2DS2VASc score of at least  4 (CHF, HTN, DM2, vascular). We will reassess [place] prior to discharge, given his comorbid conditions.  Continue to monitor electrolytes with most recent potassium 4.5 and magnesium 1.9 on 3/3.  Recent TSH 3/1 was 3.184.  HFrEF --He continues to note shortness of breath and is showing significant work with breathing during today's exam.  Continue volume management per nephrology/HD.  Continue metoprolol and BP precludes Coreg. Renal function precludes ACE/ARB/spiro/Entresto. As above, he is not an ideal candidate for invasive workup given his reduced EF as above. Continue to monitor I/O, daily  standing weights, daily BMET.  ARF --Started on HD this admission. Per nephrology.  Stage IV lung CA with malignant pleural effusions COPD with prior tobacco use --Per oncology. Continue tobacco cessation recommended.   HLD --Continue statin.  Anemia --S/p PRBC with HD. Close monitoring on heparin. Will need to consider his anemia with consideration of risks versus benefits of long term [place].   SOB/dyspnea --Continues to note shortness of breath at rest.  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  Continue management as above.  For questions or updates, please contact [place] Please consult [email or web address] for contact info under        Signed, Arvil Chaco, PA-C  03/14/2019, 12:26 PM

## 2019-03-14 NOTE — Progress Notes (Addendum)
PROGRESS NOTE    Craig Koch  SNK:[id number] DOB: 05-16-1955 DOA: 03/03/2019  PCP: Tonia Ghent, MD    LOS - 11   Brief Narrative:  64 year old male with hypertension, hyperlipidemia, type 2 diabetes mellitus, anxiety depression, stage IV lung cancer, CAD with history of CABG, tobacco and alcohol use who presented with generalized weakness and shortness of breath. In the ED he was found to be in DKA, AKI with creatinine of 1.6, new onset A. fib with RVR. Required BiPAP and admission to stepdown unit.  Hospital course prolonged due to persistent encephalopathy, rapid A. fib, and ATN.  Subjective 3/11: Very early this morning, patient noted to have increased work of breathing but without hypoxia.  Night coverage provider ordered chest x-ray which showed CHF.  Nephrology was contacted and advised to give 120 mg IV Lasix.  Patient was also placed on BiPAP and transfer order placed for stepdown unit.  This morning patient taken to dialysis on nasal cannula and maintaining oxygen saturation at 95%.  Patient reported he continues to feel short of breath but denies fever, chills, cough or congestion.  Assessment & Plan:   Principal Problem:   Atrial fibrillation with RVR (HCC) Active Problems:   HLD (hyperlipidemia)   Essential hypertension   History of alcohol use   CAD (coronary artery disease), native coronary artery   lung adenocarcinoma   Tobacco abuse   Uncontrolled type 2 diabetes mellitus with hyperglycemia (HCC)   DKA (diabetic ketoacidoses) (HCC)   Hypothermia   SOB (shortness of breath)   Acute renal failure (HCC)   NSTEMI (non-ST elevated myocardial infarction) (HCC)   Acute alteration in mental status   Palliative care encounter   Protein-calorie malnutrition, severe   Acute systolic heart failure (HCC)   Pain of right upper extremity   Symptomatic anemia   AKI (acute kidney injury) ([place])   Acute Respiratory Failure with Hypoxia due to Acute Decompensation  of HFrEF /volume overload Onset early AM of 3/11, increased work of breathing, CXR with pulmonary edema, placed on bipap.  Given 120 mg IV Lasix per nephrology.  Echo of 03/04/19 showed EF 30-35% with moderate MR. No wheezing on exam to suggest bronchospasm.  Chest x-ray personally reviewed and no appreciable infiltrates seen, appears consistent with pulmonary edema. --Patient for dialysis today, expect improvement with ultrafiltration --Supplemental oxygen to maintain O2 sat greater than 90% --BiPAP as needed to alleviate increased work of breathing --ABG pending --limit volume as much as possible, will transition off heparin gtt asap PM Addendum: Noncontrast CT chest shows new consolidation in LLL and moderate to large pleural effusion with compressive atelectasis.  Starting Vanc/Zosyn.  Will consult pulmonary in AM to consider thoracentesis. Started on Eliquis today.  AKI secondary to Acute Tubular Necrosis Baseline Cr 0.7. Secondary to hypovolemia.  Now in intermittent hemodialysis. Right femoral temporary dialysis catheter placed (started on 3/8, 3rd session today 3/10). Poor UO past 24 hours (400cc), likely will need longer term dialysis while AKI recovers. Evaluation thus far: normal PTH, MPO/PR-3 antibody, GBM, complements. --nephrology following --TOC following for outpatient dialysis set up --f/u SPEP, UPEP --dialysis per nephro --Permacath to be placed today  New onset atrial fibrillation with RVR - paroxysmal Has been in sinus for past few days.  --Cardiology following --amiodarone 200 mg PO BID x 7 doses, then 200 mg daily starting 3/16 --continue heparin gtt --Plan to transition to Eliquis or Xarelto, if renal function improves sufficiently  --continue low dose metoprolol  Acute metabolic  encephalopathy - resolved Possibly related to alcohol use. Antibiotic discontinued.  Acute systolic CHF (EF 37-62%) NSTEMI Presented with markedly elevated troponin and suspect  NSTEMI with respiratory failure and DKA. --Cardiology following --on heparin gtt --continue beta blocker, statin --Per cardiology, consider Plavix on discharge and discontinue aspirin --further ischemic evaluation pending resolution of acute renal failure --monitor volume status closely: strict I/O's, daily weights  Uncontrolled type II diabetes mellitus with DKA DKA resolved on insulin drip. Patient became hypoglycemic on 3/5 possibly due to increased dose of Lantus and worsened renal function. Off insulin and being monitored on sliding scale only. CBG stable.  Alcohol abuse No withdrawal symptoms.  Tobacco abuse Nicotine patch.   Stage IV lung adenocarcinoma Dr. Tasia Catchings following him in the hospital. Recommend to hold dabrafenib and trametinib.  Iron deficiency anemia Noted for drop in H&H. PRBC transfusion planned for 3/10 with HD.  Hypothermia Secondary to DKA. Resolved    DVT prophylaxis: on heparin gtt   Code Status: DNR  Family Communication: wife at bedside during encounter, updated and all questions answered   Disposition Plan:  Expect d/c to SNF pending set up of outpatient dialysis, placement of Permcath, cleared by nephrology and cardiology. Coming From home Exp DC Date 3/12-13 Barriers as above Medically Stable for Discharge? No   Consultants:   Nephrology  Cardiology  Procedures:   2D Echo   Antimicrobials:   None    Objective: Vitals:   03/14/19 0350 03/14/19 0457 03/14/19 0634 03/14/19 0725  BP:  (!) 162/96  (!) 113/59  Pulse: 74 95 88 76  Resp:   (!) 21 17  Temp: 97.8 F (36.6 C) 97.8 F (36.6 C)  98 F (36.7 C)  TempSrc:    Oral  SpO2: 97% 97% 98% 99%  Weight: 95.5 kg     Height:        Intake/Output Summary (Last 24 hours) at 03/14/2019 0739 Last data filed at 03/14/2019 0507 Gross per 24 hour  Intake 1247.09 ml  Output 825 ml  Net 422.09 ml   Filed Weights   03/13/19 0401 03/13/19 0649 03/14/19 0350    Weight: 94.5 kg 94.5 kg 95.5 kg    Examination:  General exam: awake, alert, no acute distress HEENT: moist mucus membranes, hearing grossly normal  Respiratory system: Diffuse rhonchi, no wheezes, increased respiratory effort. Cardiovascular system: normal S1/S2, RRR, no pedal edema.   Gastrointestinal system: soft, NT, ND, no HSM felt, +bowel sounds. Central nervous system: A&O x3. no gross focal neurologic deficits, normal speech Extremities: moves all, no edema, normal tone Skin: dry, intact, normal temperature, normal color Psychiatry: normal mood, congruent affect, judgement and insight appear normal    Data Reviewed: I have personally reviewed following labs and imaging studies  CBC: Recent Labs  Lab 03/10/19 0319 03/11/19 0643 03/12/19 0432 03/13/19 1149 03/14/19 0520  WBC 6.3 7.1 5.5 6.3 6.9  HGB 8.0* 8.0* 7.7* 8.2* 9.7*  HCT 23.1* 22.7* 22.9* 25.0* 29.0*  MCV 78.0* 77.2* 80.1 84.5 83.8  PLT 244 277 244 277 831   Basic Metabolic Panel: Recent Labs  Lab 03/10/19 0319 03/11/19 0643 03/12/19 0432 03/12/19 1012 03/13/19 0417 03/14/19 0520  NA 130* 130* 135  --  137 135  K 4.0 3.7 3.9  --  3.5 4.5  CL 96* 96* 95*  --  98 96*  CO2 15* 25 21*  --  27 24  GLUCOSE 155* 146* 168*  --  228* 265*  BUN 101* 109* 81*  --  50* 33*  CREATININE 6.09* 6.41* 5.29*  --  4.09* 3.17*  CALCIUM 7.5* 7.3* 7.5*  --  7.6* 8.0*  PHOS 5.0*  --   --  4.7*  --   --    GFR: Estimated Creatinine Clearance: 28.6 mL/min (A) (by C-G formula based on SCr of 3.17 mg/dL (H)). Liver Function Tests: Recent Labs  Lab 03/10/19 0319  ALBUMIN 1.9*   No results for input(s): LIPASE, AMYLASE in the last 168 hours. No results for input(s): AMMONIA in the last 168 hours. Coagulation Profile: No results for input(s): INR, PROTIME in the last 168 hours. Cardiac Enzymes: No results for input(s): CKTOTAL, CKMB, CKMBINDEX, TROPONINI in the last 168 hours. BNP (last 3 results) No results for  input(s): PROBNP in the last 8760 hours. HbA1C: No results for input(s): HGBA1C in the last 72 hours. CBG: Recent Labs  Lab 03/13/19 1632 03/13/19 2015 03/14/19 0021 03/14/19 0412 03/14/19 0727  GLUCAP 228* 208* 235* 223* 241*   Lipid Profile: No results for input(s): CHOL, HDL, LDLCALC, TRIG, CHOLHDL, LDLDIRECT in the last 72 hours. Thyroid Function Tests: No results for input(s): TSH, T4TOTAL, FREET4, T3FREE, THYROIDAB in the last 72 hours. Anemia Panel: Recent Labs    03/12/19 0432  RETICCTPCT 1.1   Sepsis Labs: No results for input(s): PROCALCITON, LATICACIDVEN in the last 168 hours.  Recent Results (from the past 240 hour(s))  CULTURE, BLOOD (ROUTINE X 2) w Reflex to ID Panel     Status: None   Collection Time: 03/05/19 10:16 AM   Specimen: BLOOD  Result Value Ref Range Status   Specimen Description BLOOD LEFT ANTECUBITAL  Final   Special Requests   Final    BOTTLES DRAWN AEROBIC AND ANAEROBIC Blood Culture adequate volume   Culture   Final    NO GROWTH 5 DAYS Performed at [facility], [address] [place], [place] [address]    Report Status 03/10/2019 FINAL  Final  CULTURE, BLOOD (ROUTINE X 2) w Reflex to ID Panel     Status: None   Collection Time: 03/05/19 10:22 AM   Specimen: BLOOD  Result Value Ref Range Status   Specimen Description BLOOD BLOOD LEFT HAND  Final   Special Requests   Final    BOTTLES DRAWN AEROBIC ONLY Blood Culture adequate volume   Culture   Final    NO GROWTH 5 DAYS Performed at [facility], [address], [place],  [address]    Report Status 03/10/2019 FINAL  Final         Radiology Studies: DG Chest 1 View  Result Date: 03/14/2019 CLINICAL DATA:  Wheezing EXAM: CHEST  1 VIEW COMPARISON:  03/03/2019 FINDINGS: Borderline heart size.  CABG. Diffuse interstitial opacity with Kerley lines and left more than right pleural effusion. No pneumothorax. IMPRESSION: CHF. Electronically Signed   By:  Monte Fantasia M.D.   On: 03/14/2019 05:47        Scheduled Meds: . sodium chloride  250 mL Intravenous Once  . amiodarone  400 mg Oral BID  . aspirin EC  81 mg Oral Daily  . chlorhexidine  15 mL Mouth Rinse BID  . Chlorhexidine Gluconate Cloth  6 each Topical Q0600  . dextromethorphan-guaiFENesin  1 tablet Oral BID  . dronabinol  5 mg Oral BID AC  . DULoxetine  30 mg Oral Daily  . ferrous sulfate  325 mg Oral BID WC  . insulin aspart  0-5 Units Subcutaneous QHS  . insulin aspart  0-9 Units Subcutaneous TID WC  . ipratropium-albuterol  3 mL Nebulization TID  . mouth rinse  15 mL Mouth Rinse q12n4p  . metoprolol tartrate  12.5 mg Oral BID  . multivitamin  1 tablet Oral QHS  . multivitamin with minerals  1 tablet Oral Daily  . nicotine  21 mg Transdermal Daily  . oxymetazoline  1 spray Each Nare BID  . polyethylene glycol  17 g Oral Daily  . Ensure Max Protein  11 oz Oral BID BM  . rosuvastatin  10 mg Oral Daily  . sodium bicarbonate  1,300 mg Oral BID  . sodium chloride flush  10-40 mL Intracatheter Q12H   Continuous Infusions: . heparin 1,650 Units/hr (03/14/19 0640)  . small volume/piggyback builder Stopped (03/13/19 1957)     LOS: 11 days    Time spent: 68 minutes    Ezekiel Slocumb, DO [facility]   If 7PM-7AM, please contact night-coverage [email or web address] 03/14/2019, 7:39 AM

## 2019-03-14 NOTE — Progress Notes (Signed)
Pharmacy Antibiotic Note  Craig Koch is a 64 y.o. male admitted on 03/03/2019 with sepsis.  Pharmacy has been consulted for Vancomycin, Zosyn dosing.  Pt has AKI but has had 2 HD sessions as of 3/11 PM.  HD permacath has been placed so anticipate he will receive regular HD sessions.  However, HD schedule is not yet established.  Will need to f/u on 3/12 to order schedule vanc doses and trough.   Plan: Zosyn 3.375 gm IV Q12H EI ordered to start to start on 3/11 @ 2200.  Vancomycin 2 gm IV X 1 ordered for 3/11 @ 2200. HD scheduled not clear so will need to f/u on 3/12 to order scheduled vanc doses and trough.   Height: 6' (182.9 cm) Weight: 210 lb 8.6 oz (95.5 kg) IBW/kg (Calculated) : 77.6  Temp (24hrs), Avg:97.8 F (36.6 C), Min:97.6 F (36.4 C), Max:98.1 F (36.7 C)  Recent Labs  Lab 03/08/19 0707 03/09/19 0356 03/10/19 0319 03/11/19 0643 03/12/19 0432 03/13/19 0417 03/13/19 1149 03/14/19 0520  WBC 7.9   < > 6.3 7.1 5.5  --  6.3 6.9  CREATININE 5.94*   < > 6.09* 6.41* 5.29* 4.09*  --  3.17*  VANCORANDOM 20  --   --   --   --   --   --   --    < > = values in this interval not displayed.    Estimated Creatinine Clearance: 28.6 mL/min (A) (by C-G formula based on SCr of 3.17 mg/dL (H)).    Allergies  Allergen Reactions  . Lipitor [Atorvastatin Calcium] Other (See Comments)    Aches.  Tolerated crestor.     Antimicrobials this admission:   >>    >>   Dose adjustments this admission:   Microbiology results:  BCx:  UCx:    Sputum:    MRSA PCR:   Thank you for allowing pharmacy to be a part of this patient's care.  Horice Carrero D 03/14/2019 9:41 PM

## 2019-03-14 NOTE — Progress Notes (Signed)
Bed request placed for patient to be transfer to ICU.  Bed approved for patient to come to ICU 13 at 08:45.  Meagan accepting RN in ICU had not received report as of 09:30.  Charge nurse went to 2A to help transfer patient but patient was not in room.  Per Richardson Landry he sent patient to dialysis on BiPAP.  Charge went to dialysis unit to assess patient.  Patient with stable vital signs, 02 SATs 95 on 2L.  Patient noted to have increased work of breathing.  Breath sounds with noted rhonchi bilaterally.  Dr. Arbutus Ped paged and notified about patient and need for patient to be assessed.

## 2019-03-14 NOTE — Progress Notes (Signed)
Hematology/Oncology Progress Note [facility] Telephone:([phone] [phone]  Patient Care Team: Tonia Ghent, MD as PCP - General (Family Medicine) Thelma Comp, [place] (Optometry) Telford Nab, RN as Registered Nurse   Name of the patient: Craig Koch  [id number]  14-Apr-1955  Date of visit: 03/14/19   INTERVAL HISTORY-  Patient is on BiPAP this morning.  Shortness of breath. Chest x-ray showed diffuse interstitial opacity with Kerley B lines and the left more than right pleural effusion. Lasix 120 mg x1   Review of systems- Review of Systems  Unable to perform ROS: Severe respiratory distress  Constitutional: Positive for fatigue.  Respiratory: Positive for shortness of breath.   Gastrointestinal: Negative for abdominal pain.  Musculoskeletal:       Right arm pain is better  Psychiatric/Behavioral: Negative for confusion.    Allergies  Allergen Reactions  . Lipitor [Atorvastatin Calcium] Other (See Comments)    Aches.  Tolerated crestor.     Patient Active Problem List   Diagnosis Date Noted  . AKI (acute kidney injury) ([place])   . Acute systolic heart failure ([place])   . Pain of right upper extremity   . Symptomatic anemia   . Protein-calorie malnutrition, severe 03/06/2019  . Acute alteration in mental status   . Palliative care encounter   . Atrial fibrillation with RVR ([place]) 03/03/2019  . DKA (diabetic ketoacidoses) ([place]) 03/03/2019  . Diabetes mellitus without complication ([place]) 25/95/6387  . Hypothermia 03/03/2019  . SOB (shortness of breath) 03/03/2019  . Acute renal failure ([place]) 03/03/2019  . NSTEMI (non-ST elevated myocardial infarction) ([place]) 03/03/2019  . Hyponatremia 02/08/2019  . Uncontrolled type 2 diabetes mellitus with hyperglycemia ([place]) 02/08/2019  . Leg swelling 02/08/2019  . Paronychia, finger 12/19/2018  . Encounter for antineoplastic chemotherapy 12/08/2018  . Tobacco abuse 10/12/2018  .  Malignant pleural effusion   . Administrative encounter 09/23/2018  . lung adenocarcinoma 09/08/2018  . Goals of care, counseling/discussion 09/08/2018  . Recurrent pleural effusion on left 09/02/2018  . Elevated troponin 08/24/2018  . Medicare annual wellness visit, subsequent 08/07/2018  . Lower urinary tract symptoms (LUTS) 08/07/2018  . Neck pain 08/07/2018  . CAD (coronary artery disease), native coronary artery 09/23/2017  . Smoker 09/23/2017  . Chronic joint pain 10/18/2016  . Healthcare maintenance 07/17/2016  . Advance care planning 07/17/2016  . Fungal rash of torso 05/06/2016  . Trigger finger 05/06/2016  . History of alcohol use 11/17/2015  . Skin lesion 05/19/2015  . Anxiety state 02/19/2015  . Hand weakness 05/23/2011  . Diabetes mellitus with complication ([place]) 56/43/3295  . HLD (hyperlipidemia) 03/05/2010  . Essential hypertension 03/05/2010  . Coronary atherosclerosis 03/05/2010     Past Medical History:  Diagnosis Date  . Anxiety   . Arthritis   . Coronary artery disease   . Diabetes mellitus   . Dyslipidemia   . Hx of CABG   . Hyperlipidemia   . Hypertension   . Malignant neoplasm of unspecified part of unspecified bronchus or lung ([place]) 08/2018   Immunotherapy  . Pleural effusion      Past Surgical History:  Procedure Laterality Date  . CATARACT EXTRACTION Left 09/2015  . CHEST TUBE INSERTION Left 10/01/2018   Procedure: INSERTION PLEURAL DRAINAGE CATHETER;  Surgeon: [name], MD;  Location: [facility] ORS;  Service: Thoracic;  Laterality: Left;  . CORONARY ARTERY BYPASS GRAFT  2004   (CABG with LIMA to the  LAD, SVG to OM2/OM3, SVG  to diag  .  Left ankle surgery     repair of fracture  . Right lower leg surgery     rod  . TEMPORARY DIALYSIS CATHETER N/A 03/11/2019   Procedure: TEMPORARY DIALYSIS CATHETER;  Surgeon: Algernon Huxley, MD;  Location: [facility];  Service: Cardiovascular;  Laterality: N/A;    Social History    Socioeconomic History  . Marital status: Married    Spouse name: Not on file  . Number of children: Not on file  . Years of education: Not on file  . Highest education level: Not on file  Occupational History  . Not on file  Tobacco Use  . Smoking status: Current Every Day Smoker    Packs/day: 0.20    Years: 45.00    Pack years: 9.00    Types: Cigarettes  . Smokeless tobacco: Former Systems developer    Types: Snuff  Substance and Sexual Activity  . Alcohol use: Yes    Alcohol/week: 6.0 standard drinks    Types: 6 Cans of beer per week    Comment: occ, average 6 pack in a week  . Drug use: No  . Sexual activity: Not Currently  Other Topics Concern  . Not on file  Social History Narrative   On disability 2009 after prev injuries and CAD.     Married 1976   2 kids, 4 grandkids.    Social Determinants of Health   Financial Resource Strain:   . Difficulty of Paying Living Expenses:   Food Insecurity:   . Worried About Charity fundraiser in the Last Year:   . Arboriculturist in the Last Year:   Transportation Needs:   . Film/video editor (Medical):   [name] Lack of Transportation (Non-Medical):   Physical Activity:   . Days of Exercise per Week:   . Minutes of Exercise per Session:   Stress:   . Feeling of Stress :   Social Connections:   . Frequency of Communication with Friends and Family:   . Frequency of Social Gatherings with Friends and Family:   . Attends Religious Services:   . Active Member of Clubs or Organizations:   . Attends Archivist Meetings:   [name] Marital Status:   Intimate Partner Violence:   . Fear of Current or Ex-Partner:   . Emotionally Abused:   [name] Physically Abused:   . Sexually Abused:      Family History  Problem Relation Age of Onset  . Dementia Mother   . Heart disease Father   . Colon cancer Neg Hx   . Prostate cancer Neg Hx   . Diabetes Neg Hx      Current Facility-Administered Medications:  .  0.9 %  sodium chloride  infusion (Manually program via Guardrails IV Fluids), 250 mL, Intravenous, Once, Earlie Server, MD .  acetaminophen (TYLENOL) tablet 650 mg, 650 mg, Oral, Q6H PRN, Lucky Cowboy, Erskine Squibb, MD .  albuterol (PROVENTIL) (2.5 MG/3ML) 0.083% nebulizer solution 3 mL, 3 mL, Inhalation, Q4H PRN, Algernon Huxley, MD, 3 mL at 03/14/19 0517 .  amiodarone (PACERONE) tablet 400 mg, 400 mg, Oral, BID, Dew, Erskine Squibb, MD, 400 mg at 03/13/19 2011 .  aspirin EC tablet 81 mg, 81 mg, Oral, Daily, Algernon Huxley, MD, 81 mg at 03/13/19 1045 .  chlorhexidine (PERIDEX) 0.12 % solution 15 mL, 15 mL, Mouth Rinse, BID, Dew, Erskine Squibb, MD, 15 mL at 03/13/19 2012 .  Chlorhexidine Gluconate Cloth 2 % PADS 6 each, 6  each, Topical, [id number], Algernon Huxley, MD, 6 each at 03/13/19 [phone] .  dextromethorphan-guaiFENesin (MUCINEX DM) 30-600 MG per 12 hr tablet 1 tablet, 1 tablet, Oral, BID, Dew, Erskine Squibb, MD, 1 tablet at 03/13/19 2011 .  dextrose 50 % solution 0-50 mL, 0-50 mL, Intravenous, PRN, Algernon Huxley, MD, 25 mL at 03/08/19 0355 .  dronabinol (MARINOL) capsule 5 mg, 5 mg, Oral, BID AC, Algernon Huxley, MD, 5 mg at 03/13/19 1654 .  DULoxetine (CYMBALTA) DR capsule 30 mg, 30 mg, Oral, Daily, Dew, Erskine Squibb, MD, 30 mg at 03/13/19 1045 .  ferrous sulfate tablet 325 mg, 325 mg, Oral, BID WC, Dew, Erskine Squibb, MD, 325 mg at 03/13/19 1654 .  heparin ADULT infusion 100 units/mL (25000 units/266m sodium chloride 0.45%), 1,650 Units/hr, Intravenous, Continuous, GNicole KindredA, DO, Last Rate: 16.5 mL/hr at 03/14/19 0640, 1,650 Units/hr at 03/14/19 0640 .  heparin lock flush 100 unit/mL, 500 Units, Intracatheter, Daily PRN, YEarlie Server MD .  heparin lock flush 100 unit/mL, 250 Units, Intracatheter, PRN, YEarlie Server MD .  insulin aspart (novoLOG) injection 0-5 Units, 0-5 Units, Subcutaneous, QHS, Dhungel, Nishant, MD, 2 Units at 03/13/19 2029 .  insulin aspart (novoLOG) injection 0-9 Units, 0-9 Units, Subcutaneous, TID WC, Dhungel, Nishant, MD, 3 Units at 03/13/19 1653 .   ipratropium-albuterol (DUONEB) 0.5-2.5 (3) MG/3ML nebulizer solution 3 mL, 3 mL, Nebulization, TID, Dew, JErskine Squibb MD, 3 mL at 03/13/19 2048 .  MEDLINE mouth rinse, 15 mL, Mouth Rinse, q12n4p, Dew, JErskine Squibb MD, 15 mL at 03/13/19 1716 .  menthol-cetylpyridinium (CEPACOL) lozenge 3 mg, 1 lozenge, Oral, PRN, DLucky Cowboy JErskine Squibb MD .  metoprolol tartrate (LOPRESSOR) tablet 12.5 mg, 12.5 mg, Oral, BID, Dew, JErskine Squibb MD, 12.5 mg at 03/13/19 2011 .  multivitamin (RENA-VIT) tablet 1 tablet, 1 tablet, Oral, QHS, Dew, JErskine Squibb MD, 1 tablet at 03/13/19 2011 .  multivitamin with minerals tablet 1 tablet, 1 tablet, Oral, Daily, Dew, JErskine Squibb MD, 1 tablet at 03/13/19 1046 .  nicotine (NICODERM CQ - dosed in mg/24 hours) patch 21 mg, 21 mg, Transdermal, Daily, Dew, JErskine Squibb MD, 21 mg at 03/13/19 1203 .  ondansetron (ZOFRAN) injection 4 mg, 4 mg, Intravenous, Q8H PRN, Dew, JErskine Squibb MD .  oxymetazoline (AFRIN) 0.05 % nasal spray 1 spray, 1 spray, Each Nare, BID, Dhungel, Nishant, MD, 1 spray at 03/13/19 2012 .  pneumococcal 23 valent vaccine (PNEUMOVAX-23) injection 0.5 mL, 0.5 mL, Intramuscular, Prior to discharge, Sreenath, Sudheer B, MD .  polyethylene glycol (MIRALAX / GLYCOLAX) packet 17 g, 17 g, Oral, Daily, Dew, JErskine Squibb MD, 17 g at 03/09/19 1014 .  protein supplement (ENSURE MAX) liquid, 11 oz, Oral, BID BM, DAlgernon Huxley MD, 11 oz at 03/13/19 1209 .  rosuvastatin (CRESTOR) tablet 10 mg, 10 mg, Oral, Daily, Dew, JErskine Squibb MD, 10 mg at 03/13/19 1045 .  sodium bicarbonate tablet 1,300 mg, 1,300 mg, Oral, BID, Dew, JErskine Squibb MD, 1,300 mg at 03/13/19 2011 .  sodium chloride (OCEAN) 0.65 % nasal spray 1 spray, 1 spray, Each Nare, PRN, DAlgernon Huxley MD, 1 spray at 03/11/19 0503 .  sodium chloride flush (NS) 0.9 % injection 10-40 mL, 10-40 mL, Intracatheter, Q12H, Dhungel, Nishant, MD, 10 mL at 03/13/19 2015 .  sodium chloride flush (NS) 0.9 % injection 10-40 mL, 10-40 mL, Intracatheter, PRN, Dhungel, Nishant, MD .  sodium  chloride flush (NS) 0.9 % injection 3 mL, 3 mL, Intracatheter, PRN, YEarlie Server MD .  thiamine (B-1) 202 mg, folic acid 1 mg in sodium chloride 0.9 % 50 mL injection, , Intravenous, Q24H, Algernon Huxley, MD, Stopped at 03/13/19 1957   Physical exam:  Vitals:   03/14/19 0350 03/14/19 0457 03/14/19 0634 03/14/19 0725  BP:  (!) 162/96  (!) 113/59  Pulse: 74 95 88 76  Resp:   (!) 21 17  Temp: 97.8 F (36.6 C) 97.8 F (36.6 C)  98 F (36.7 C)  TempSrc:    Oral  SpO2: 97% 97% 98% 99%  Weight: 210 lb 9.6 oz (95.5 kg)     Height:       Physical Exam  Constitutional: He is oriented to person, place, and time. No distress.  HENT:  Head: Normocephalic and atraumatic.  Eyes: Pupils are equal, round, and reactive to light. EOM are normal. No scleral icterus.  Cardiovascular: Normal rate and regular rhythm.  No murmur heard. Pulmonary/Chest: Effort normal.  On BiPAP  Abdominal: Soft.  Musculoskeletal:        General: No edema. Normal range of motion.     Cervical back: Normal range of motion and neck supple.     Comments: Right upper arm tenderness  Neurological: He is alert and oriented to person, place, and time.  Skin: Skin is warm and dry. No erythema.  Ecchymosis and bruising on right upper extremity.  Psychiatric: Affect normal.       CMP Latest Ref Rng & Units 03/14/2019  Glucose 70 - 99 mg/dL 265(H)  BUN 8 - 23 mg/dL 33(H)  Creatinine 0.61 - 1.24 mg/dL 3.17(H)  Sodium 135 - 145 mmol/L 135  Potassium 3.5 - 5.1 mmol/L 4.5  Chloride 98 - 111 mmol/L 96(L)  CO2 22 - 32 mmol/L 24  Calcium 8.9 - 10.3 mg/dL 8.0(L)  Total Protein 6.5 - 8.1 g/dL -  Total Bilirubin 0.3 - 1.2 mg/dL -  Alkaline Phos 38 - 126 U/L -  AST 15 - 41 U/L -  ALT 0 - 44 U/L -   CBC Latest Ref Rng & Units 03/14/2019  WBC 4.0 - 10.5 K/uL 6.9  Hemoglobin 13.0 - 17.0 g/dL 9.7(L)  Hematocrit 39.0 - 52.0 % 29.0(L)  Platelets 150 - 400 K/uL 302    RADIOGRAPHIC STUDIES: I have personally reviewed the  radiological images as listed and agreed with the findings in the report. DG Chest 1 View  Result Date: 03/14/2019 CLINICAL DATA:  Wheezing EXAM: CHEST  1 VIEW COMPARISON:  03/03/2019 FINDINGS: Borderline heart size.  CABG. Diffuse interstitial opacity with Kerley lines and left more than right pleural effusion. No pneumothorax. IMPRESSION: CHF. Electronically Signed   By: Monte Fantasia M.D.   On: 03/14/2019 05:47   CT HEAD WO CONTRAST  Result Date: 03/05/2019 CLINICAL DATA:  Metastatic disease evaluation.  Lung cancer. EXAM: CT HEAD WITHOUT CONTRAST TECHNIQUE: Contiguous axial images were obtained from the base of the skull through the vertex without intravenous contrast. COMPARISON:  MRI head 09/20/2018 FINDINGS: Brain: Mild atrophy. Mild hypodensity in the white matter unchanged from the prior MRI. No acute infarct, hemorrhage, mass. No edema or midline shift. Vascular: Negative for hyperdense vessel Skull: Negative skull. Sinuses/Orbits: Mucosal edema and bony thickening right maxillary sinus. Chronic nasal bone fracture. Left cataract extraction. Other: None IMPRESSION: No acute abnormality and negative for metastatic disease on unenhanced CT. Atrophy and mild chronic microvascular ischemia. Electronically Signed   By: Franchot Gallo M.D.   On: 03/05/2019 15:28   US RENAL  Result Date: 03/10/2019  CLINICAL DATA:  64 year old presenting with acute renal failure. Evaluate for obstruction as a possible cause. EXAM: RENAL / URINARY TRACT ULTRASOUND COMPLETE COMPARISON:  Urinary tract ultrasound 03/04/2019. CT abdomen and pelvis 01/22/2019. FINDINGS: Right Kidney: Renal measurements: Approximately 12.6 x 6.4 x 6.2 cm = volume: 264 mL. Mildly echogenic parenchyma. No hydronephrosis. Well-preserved cortex. No shadowing calculi. No focal parenchymal abnormality. Left Kidney: Renal measurements: Approximately 12.5 x 6.3 x 6.9 cm = volume: 283 mL. Mildly echogenic parenchyma. No hydronephrosis. Well-preserved  cortex. No shadowing calculi. No focal parenchymal abnormality. Bladder: Normal for degree of bladder distention. Other: None. IMPRESSION: 1. Mildly echogenic renal parenchyma indicative of medical renal disease. 2. Otherwise normal examination. Specifically, no evidence of urinary tract obstruction to account for the acute renal insufficiency. Electronically Signed   By: Evangeline Dakin M.D.   On: 03/10/2019 15:02   US RENAL  Result Date: 03/04/2019 CLINICAL DATA:  Acute kidney injury EXAM: RENAL / URINARY TRACT ULTRASOUND COMPLETE COMPARISON:  CT 01/22/2019 FINDINGS: Right Kidney: Renal measurements: 11.4 x 6 x 6 cm = volume: 215 mL . Echogenicity within normal limits. No mass or hydronephrosis visualized. Left Kidney: Renal measurements: 11.8 x 7.3 x 6.5 cm = volume: 291.8 mL. Echogenicity within normal limits. No mass or hydronephrosis visualized. Bladder: Appears normal for degree of bladder distention. Other: Incidental note made of gallstones and small effusions. Heterogenous prostate. IMPRESSION: Normal ultrasound appearance of the kidneys Electronically Signed   By: Donavan Foil M.D.   On: 03/04/2019 15:37   PERIPHERAL VASCULAR CATHETERIZATION  Result Date: 03/11/2019 See op note  DG Chest Portable 1 View  Result Date: 03/03/2019 CLINICAL DATA:  Shortness of breath, since Monday worsening this morning around 1 a.m. EXAM: PORTABLE CHEST 1 VIEW COMPARISON:  12/19/2018 FINDINGS: Cardiomediastinal contours are stable following median sternotomy with persistent left-sided pleural effusion and pleural-parenchymal scarring. No new areas of consolidation or evidence of right-sided pleural effusion. No signs of acute bone process. IMPRESSION: Persistent left-sided pleural effusion and pleural-parenchymal scarring. Electronically Signed   By: Zetta Bills M.D.   On: 03/03/2019 13:59   ECHOCARDIOGRAM COMPLETE  Result Date: 03/04/2019    ECHOCARDIOGRAM REPORT   Patient Name:   AARAV BURGETT Date of  Exam: 03/04/2019 Medical Rec #:  [id number]       Height:       72.0 in Accession #:    [id number]      Weight:       185.0 lb Date of Birth:  [birth year]       BSA:          2.061 m Patient Age:    70 years        BP:           125/72 mmHg Patient Gender: M               HR:           119 bpm. Exam Location:  [facility] Procedure: 2D Echo, Color Doppler and Cardiac Doppler Indications:     I21.4 NSTEMI  History:         Patient has prior history of Echocardiogram examinations, most                  recent 08/24/2018. CAD, Prior CABG; Risk Factors:Hypertension,                  Dyslipidemia and Diabetes. Lung Cancer.  Sonographer:     Charmayne Sheer RDCS (AE) Referring Phys:  [place] Phys: Kathlyn Sacramento MD IMPRESSIONS  1. Left ventricular ejection fraction, by estimation, is 30 to 35%. The left ventricle has moderately decreased function. The left ventricle demonstrates global hypokinesis. Left ventricular diastolic parameters are indeterminate.  2. Right ventricular systolic function is normal. The right ventricular size is normal. Tricuspid regurgitation signal is inadequate for assessing PA pressure.  3. Left atrial size was mild to moderately dilated.  4. The mitral valve is normal in structure and function. Moderate mitral valve regurgitation. No evidence of mitral stenosis.  5. The aortic valve is normal in structure and function. Aortic valve regurgitation is trivial. Mild to moderate aortic valve sclerosis/calcification is present, without any evidence of aortic stenosis.  6. The inferior vena cava is normal in size with greater than 50% respiratory variability, suggesting right atrial pressure of 3 mmHg. FINDINGS  Left Ventricle: Left ventricular ejection fraction, by estimation, is 30 to 35%. The left ventricle has moderately decreased function. The left ventricle demonstrates global hypokinesis. The left ventricular internal cavity size was normal in size. There is no left ventricular  hypertrophy. Left ventricular diastolic parameters are indeterminate. Right Ventricle: The right ventricular size is normal. No increase in right ventricular wall thickness. Right ventricular systolic function is normal. Tricuspid regurgitation signal is inadequate for assessing PA pressure. Left Atrium: Left atrial size was mild to moderately dilated. Right Atrium: Right atrial size was normal in size. Pericardium: There is no evidence of pericardial effusion. Mitral Valve: The mitral valve is normal in structure and function. Normal mobility of the mitral valve leaflets. Moderate mitral valve regurgitation. No evidence of mitral valve stenosis. MV peak gradient, 7.5 mmHg. The mean mitral valve gradient is 4.0  mmHg. Tricuspid Valve: The tricuspid valve is normal in structure. Tricuspid valve regurgitation is not demonstrated. No evidence of tricuspid stenosis. Aortic Valve: The aortic valve is normal in structure and function. Aortic valve regurgitation is trivial. Aortic regurgitation PHT measures 281 msec. Mild to moderate aortic valve sclerosis/calcification is present, without any evidence of aortic stenosis. Aortic valve mean gradient measures 5.0 mmHg. Aortic valve peak gradient measures 8.5 mmHg. Aortic valve area, by VTI measures 2.53 cm. Pulmonic Valve: The pulmonic valve was normal in structure. Pulmonic valve regurgitation is not visualized. No evidence of pulmonic stenosis. Aorta: The aortic root is normal in size and structure. Venous: The inferior vena cava is normal in size with greater than 50% respiratory variability, suggesting right atrial pressure of 3 mmHg. IAS/Shunts: No atrial level shunt detected by color flow Doppler.  LEFT VENTRICLE PLAX 2D LVIDd:         5.21 cm  Diastology LVIDs:         4.21 cm  LV e' lateral:   7.83 cm/s LV PW:         0.84 cm  LV E/e' lateral: 13.4 LV IVS:        0.89 cm  LV e' medial:    7.94 cm/s LVOT diam:     2.40 cm  LV E/e' medial:  13.2 LV SV:         54 LV  SV Index:   26 LVOT Area:     4.52 cm  RIGHT VENTRICLE RV Basal diam:  3.08 cm LEFT ATRIUM             Index       RIGHT ATRIUM           Index LA diam:  4.70 cm 2.28 cm/m  RA Area:     14.10 cm LA Vol (A2C):   62.0 ml 30.08 ml/m RA Volume:   35.20 ml  17.08 ml/m LA Vol (A4C):   68.6 ml 33.29 ml/m LA Biplane Vol: 65.3 ml 31.68 ml/m  AORTIC VALVE                    PULMONIC VALVE AV Area (Vmax):    2.95 cm     PV Vmax:       1.10 m/s AV Area (Vmean):   2.86 cm     PV Vmean:      68.400 cm/s AV Area (VTI):     2.53 cm     PV VTI:        0.146 m AV Vmax:           146.00 cm/s  PV Peak grad:  4.8 mmHg AV Vmean:          103.000 cm/s PV Mean grad:  2.0 mmHg AV VTI:            0.213 m AV Peak Grad:      8.5 mmHg AV Mean Grad:      5.0 mmHg LVOT Vmax:         95.30 cm/s LVOT Vmean:        65.200 cm/s LVOT VTI:          0.119 m LVOT/AV VTI ratio: 0.56 AI PHT:            281 msec  AORTA Ao Root diam: 3.00 cm MITRAL VALVE MV Area (PHT): 3.92 cm     SHUNTS MV Peak grad:  7.5 mmHg     Systemic VTI:  0.12 m MV Mean grad:  4.0 mmHg     Systemic Diam: 2.40 cm MV Vmax:       1.37 m/s MV Vmean:      92.7 cm/s MV Decel Time: 194 msec MV E velocity: 104.80 cm/s Kathlyn Sacramento MD Electronically signed by Kathlyn Sacramento MD Signature Date/Time: 03/04/2019/2:46:43 PM    Final    [place] RT UPPER EXTREM LTD SOFT TISSUE NON VASCULAR  Result Date: 03/08/2019 CLINICAL DATA:  Swelling and bruising of the right upper extremity. Possible hematoma. EXAM: ULTRASOUND right UPPER EXTREMITY LIMITED TECHNIQUE: Ultrasound examination of the upper extremity soft tissues was performed in the area of clinical concern. COMPARISON:  None. FINDINGS: Diffuse subcutaneous soft tissue swelling/edema and streaky fluid. No discrete fluid collection or hematoma. The underlying musculature is grossly normal. IMPRESSION: Diffuse subcutaneous soft tissue swelling/edema/streaky fluid but no discrete abscess or hematoma. Electronically Signed   By: Marijo Sanes M.D.   On: 03/08/2019 19:13    Assessment and plan-  Patient is a 64 y.o. male with history of stage IV lung cancer, BRAF mutation on targeted therapy, hypertension, hyperlipidemia, CAD with history of CABG, diabetes, anxiety presented to emergency room due to breathing difficulties.he was found have DKA, AKI, mental status change.  #Acute renal failure, ATN, creatinine continues to rise.  Status post hemodialysis x3 sessions.  #Acute NSTEMI /atrial fibrillation with RVR, cardiology on board.  Acute CHF, ejection fraction decreased to 30%.   Chest x-ray showed pulmonary edema.  On BiPAP.  #Microcytic anemia, status post 1 unit of PRBC transfusion with hemodialysis.  Hemoglobin has improved. Continue oral iron supplementation.  # #Stage IV lung adenocarcinoma, patient has [id number] E mutation, and had been on dabrafenib and trametinib prior to admission.  Hold off targeted therapy due to acute illness.  He may be a candidate of outpatient immunotherapy after he recovers from current acute illness.   CODE STATUS, DNR/DNI. Thank you for allowing me to participate in the care of this patient.   Earlie Server, MD, PhD Hematology Oncology [facility] at [facility] Pager- [phone] 03/14/2019

## 2019-03-14 NOTE — Progress Notes (Signed)
[facility], [place] 03/14/19  Subjective:  Patient having increasing shortness of breath.  Patient placed on dialysis this morning.  Tolerating well.    Objective:  Vital signs in last 24 hours:  Temp:  [97.3 F (36.3 C)-98 F (36.7 C)] 97.6 F (36.4 C) (03/11 0900) Pulse Rate:  [74-95] 93 (03/11 1030) Resp:  [17-25] 21 (03/11 1030) BP: (107-162)/(55-96) 145/72 (03/11 1030) SpO2:  [95 %-100 %] 97 % (03/11 0833) Weight:  [95.5 kg] 95.5 kg (03/11 0350)  Weight change: 0.727 kg Filed Weights   03/13/19 0401 03/13/19 0649 03/14/19 0350  Weight: 94.5 kg 94.5 kg 95.5 kg    Intake/Output:    Intake/Output Summary (Last 24 hours) at 03/14/2019 1036 Last data filed at 03/14/2019 0954 Gross per 24 hour  Intake 547.09 ml  Output 1125 ml  Net -577.91 ml     Physical Exam: General:  Ill-appearing, laying in bed  HEENT  moist oral mucous membranes, decreased hearing  Pulm/lungs  Bilateral rhonchi and rales, normal effort  CVS/Heart  S1S2 irregular  Abdomen:   Soft, nontender, bowel sounds present  Extremities:  1+ pitting edema  Neurologic:  Awake, alert, conversant  Skin:  Warm, dry          Basic Metabolic Panel:  Recent Labs  Lab 03/10/19 0319 03/10/19 0319 03/11/19 0643 03/11/19 0643 03/12/19 0432 03/12/19 1012 03/13/19 0417 03/14/19 0520  NA 130*  --  130*  --  135  --  137 135  K 4.0  --  3.7  --  3.9  --  3.5 4.5  CL 96*  --  96*  --  95*  --  98 96*  CO2 15*  --  25  --  21*  --  27 24  GLUCOSE 155*  --  146*  --  168*  --  228* 265*  BUN 101*  --  109*  --  81*  --  50* 33*  CREATININE 6.09*  --  6.41*  --  5.29*  --  4.09* 3.17*  CALCIUM 7.5*   < > 7.3*   < > 7.5*  --  7.6* 8.0*  PHOS 5.0*  --   --   --   --  4.7*  --   --    < > = values in this interval not displayed.     CBC: Recent Labs  Lab 03/10/19 0319 03/11/19 0643 03/12/19 0432 03/13/19 1149 03/14/19 0520  WBC 6.3 7.1 5.5 6.3 6.9  HGB 8.0* 8.0* 7.7*  8.2* 9.7*  HCT 23.1* 22.7* 22.9* 25.0* 29.0*  MCV 78.0* 77.2* 80.1 84.5 83.8  PLT 244 277 244 277 302      Lab Results  Component Value Date   HEPBSAG NON REACTIVE 03/11/2019   HEPBIGM NON REACTIVE 03/11/2019      Microbiology:  Recent Results (from the past 240 hour(s))  CULTURE, BLOOD (ROUTINE X 2) w Reflex to ID Panel     Status: None   Collection Time: 03/05/19 10:16 AM   Specimen: BLOOD  Result Value Ref Range Status   Specimen Description BLOOD LEFT ANTECUBITAL  Final   Special Requests   Final    BOTTLES DRAWN AEROBIC AND ANAEROBIC Blood Culture adequate volume   Culture   Final    NO GROWTH 5 DAYS Performed at [facility], [address], [place], [place] [address]    Report Status 03/10/2019 FINAL  Final  CULTURE, BLOOD (ROUTINE X 2) w  Reflex to ID Panel     Status: None   Collection Time: 03/05/19 10:22 AM   Specimen: BLOOD  Result Value Ref Range Status   Specimen Description BLOOD BLOOD LEFT HAND  Final   Special Requests   Final    BOTTLES DRAWN AEROBIC ONLY Blood Culture adequate volume   Culture   Final    NO GROWTH 5 DAYS Performed at [facility], [address], [place], [place] [address]    Report Status 03/10/2019 FINAL  Final    Coagulation Studies: No results for input(s): LABPROT, INR in the last 72 hours.  Urinalysis: No results for input(s): COLORURINE, LABSPEC, PHURINE, GLUCOSEU, HGBUR, BILIRUBINUR, KETONESUR, PROTEINUR, UROBILINOGEN, NITRITE, LEUKOCYTESUR in the last 72 hours.  Invalid input(s): APPERANCEUR    Imaging: DG Chest 1 View  Result Date: 03/14/2019 CLINICAL DATA:  Wheezing EXAM: CHEST  1 VIEW COMPARISON:  03/03/2019 FINDINGS: Borderline heart size.  CABG. Diffuse interstitial opacity with Kerley lines and left more than right pleural effusion. No pneumothorax. IMPRESSION: CHF. Electronically Signed   By: Monte Fantasia M.D.   On: 03/14/2019 05:47     Medications:   . heparin 1,650 Units/hr  (03/14/19 0640)  . small volume/piggyback builder Stopped (03/13/19 1957)   . sodium chloride  250 mL Intravenous Once  . amiodarone  400 mg Oral BID  . aspirin EC  81 mg Oral Daily  . chlorhexidine  15 mL Mouth Rinse BID  . Chlorhexidine Gluconate Cloth  6 each Topical Q0600  . dextromethorphan-guaiFENesin  1 tablet Oral BID  . dronabinol  5 mg Oral BID AC  . DULoxetine  30 mg Oral Daily  . ferrous sulfate  325 mg Oral BID WC  . insulin aspart  0-5 Units Subcutaneous QHS  . insulin aspart  0-9 Units Subcutaneous TID WC  . ipratropium-albuterol  3 mL Nebulization TID  . LORazepam  0.5 mg Intravenous Once  . mouth rinse  15 mL Mouth Rinse q12n4p  . metoprolol tartrate  12.5 mg Oral BID  . multivitamin  1 tablet Oral QHS  . multivitamin with minerals  1 tablet Oral Daily  . nicotine  21 mg Transdermal Daily  . oxymetazoline  1 spray Each Nare BID  . polyethylene glycol  17 g Oral Daily  . Ensure Max Protein  11 oz Oral BID BM  . rosuvastatin  10 mg Oral Daily  . sodium bicarbonate  1,300 mg Oral BID  . sodium chloride flush  10-40 mL Intracatheter Q12H   acetaminophen, albuterol, dextrose, heparin lock flush, heparin lock flush, menthol-cetylpyridinium, ondansetron (ZOFRAN) IV, pneumococcal 23 valent vaccine, sodium chloride, sodium chloride flush, sodium chloride flush  Assessment/ Plan:  64 y.o. male with hypertension, hyperlipidemia, diabetes, depression, anxiety, adenocarcinoma of the lung stage IV, coronary disease, history of CABG, tobacco and h/o alcohol abuse, left pleural effusion,  admitted on 03/03/2019 for DKA (diabetic ketoacidoses) ([place]) [E11.10] New onset atrial fibrillation ([place]) [I48.91] Atrial fibrillation with rapid ventricular response ([place]) [I48.91] Diabetic ketoacidosis without coma associated with other specified diabetes mellitus ([place]) [E13.10]     #Acute kidney injury, Metabolic acidosis Baseline creatinine of 0.7 on February 5.  Patient did have IV  contrast exposure on January 19. Renal imaging: Kidney ultrasound March 1: Normal kidneys  Acute kidney injury is likely secondary to ATN caused by hypotension, hemodynamic instability.   Patient had increasing shortness of breath today.  Therefore he is undergoing another dialysis session today.  Ultrafiltration target 1.5 to 2 kg.   #  Hyponatremia Serum sodium currently 135.  Continue to monitor.  #Diabetic ketoacidosis, poorly controlled diabetes Management as per ICU team Lab Results  Component Value Date   HGBA1C 11.3 (H) 03/04/2019   Multiple underlying chronic illnesses, poorly controlled diabetes, reported history of tobacco and h/o alcohol abuse, with underlying coronary disease, history of CABG, CHF with EF 30 to 35%, stage IV adenocarcinoma  # Hypokalemia Resolved at the moment.  Serum potassium 4.5.    LOS: 11 Bessy Reaney 3/11/202110:36 AM  [place], [place]  Note: This note was prepared with Dragon dictation. Any transcription errors are unintentional

## 2019-03-14 NOTE — Progress Notes (Addendum)
Patient called at 4 saying he felt like he couldn't breathe. Vital signs stable. Sharion Settler, NP notified of acute change. Breathing treatment given, but did not help with patient breathing much. Is labored and wheezy. Patient is also anxious. NP ordered stat Chest xray. Continuing to monitor.

## 2019-03-14 NOTE — Plan of Care (Signed)
  Problem: Education: Goal: Knowledge of General Education information will improve Description: Including pain rating scale, medication(s)/side effects and non-pharmacologic comfort measures Outcome: Progressing   Problem: Health Behavior/Discharge Planning: Goal: Ability to manage health-related needs will improve Outcome: Progressing Note: Pt. received a new HD PermCath today, along with receiving an HD session prior. Remains off of BiPAP at this time. Respiratory rate W.D.L. and non-labored. Will continue to monitor overall progression for the remainder of the shift. Wenda Low [facility]

## 2019-03-14 NOTE — Progress Notes (Signed)
OT Cancellation Note  Patient Details Name: Craig Koch MRN: [id number] DOB: 1955-10-28   Cancelled Treatment:    Reason Eval/Treat Not Completed: Patient at procedure or test/ unavailable  Attempted to see patient but he was out of room at diaylsis.  Will attempt again later today or tomorrow.  Chrys Racer, OTR/L, NTMTC ascom [phone] 03/14/19, 11:20 AM

## 2019-03-14 NOTE — Progress Notes (Signed)
Hd started  

## 2019-03-14 NOTE — Progress Notes (Signed)
PT Cancellation Note  Patient Details Name: Craig Koch MRN: [id number] DOB: 06/11/1955   Cancelled Treatment:    Reason Eval/Treat Not Completed: Patient's level of consciousness Pt had dialysis catheter placed this afternoon after AM dialysis. (Temp fem cath still in place)  Family in room and reports that he is soundly sleeping and has a lot of meds in his system.  She requests PT to not even try to wake him at this point.  Will maintain pt on PT caseload and continue to attempt as appropriate.  Kreg Shropshire, DPT 03/14/2019, 5:09 PM

## 2019-03-14 NOTE — Op Note (Signed)
OPERATIVE NOTE    PRE-OPERATIVE DIAGNOSIS: 1. ESRD   POST-OPERATIVE DIAGNOSIS: same as above  PROCEDURE: 1. Ultrasound guidance for vascular access to the right internal jugular vein 2. Fluoroscopic guidance for placement of catheter 3. Placement of a 19 cm tip to cuff tunneled hemodialysis catheter via the right internal jugular vein  SURGEON: Leotis Pain, MD  ANESTHESIA:  Local with Moderate conscious sedation for approximately 15 minutes using 2 mg of Versed and 50 mcg of Fentanyl  ESTIMATED BLOOD LOSS: 10 cc  FLUORO TIME: less than one minute  CONTRAST: none  FINDING(S): 1.  Patent right internal jugular vein  SPECIMEN(S):  None  INDICATIONS:   Craig Koch is a 64 y.o.male who presents with renal failure.  The patient needs long term dialysis access for their ESRD, and a Permcath is necessary.  Risks and benefits are discussed and informed consent is obtained.    DESCRIPTION: After obtaining full informed written consent, the patient was brought back to the vascular suited. The patient's right neck and chest were sterilely prepped and draped in a sterile surgical field was created. Moderate conscious sedation was administered during a face to face encounter with the patient throughout the procedure with my supervision of the RN administering medicines and monitoring the patient's vital signs, pulse oximetry, telemetry and mental status throughout from the start of the procedure until the patient was taken to the recovery room.  The right internal jugular vein was visualized with ultrasound and found to be patent. It was then accessed under direct ultrasound guidance and a permanent image was recorded. A wire was placed. After skin nick and dilatation, the peel-away sheath was placed over the wire. I then turned my attention to an area under the clavicle. Approximately 1-2 fingerbreadths below the clavicle a small counterincision was created and tunneled from the subclavicular  incision to the access site. Using fluoroscopic guidance, a 19 centimeter tip to cuff tunneled hemodialysis catheter was selected, and tunneled from the subclavicular incision to the access site. It was then placed through the peel-away sheath and the peel-away sheath was removed. Using fluoroscopic guidance the catheter tips were parked in the right atrium. The appropriate distal connectors were placed. It withdrew blood well and flushed easily with heparinized saline and a concentrated heparin solution was then placed. It was secured to the chest wall with 2 Prolene sutures. The access incision was closed single 4-0 Monocryl. A 4-0 Monocryl pursestring suture was placed around the exit site. Sterile dressings were placed. The patient tolerated the procedure well and was taken to the recovery room in stable condition.  COMPLICATIONS: None  CONDITION: Stable  Leotis Pain, MD 03/14/2019 2:24 PM   This note was created with Dragon Medical transcription system. Any errors in dictation are purely unintentional.

## 2019-03-14 NOTE — Progress Notes (Signed)
Pt removed from BiPAP, per cancellation of order by NP. Pt placed on room air with sats stable in mid 90's, Breathing treatment given. Pt stated that BiPAP helped him very much.

## 2019-03-14 NOTE — Progress Notes (Signed)
Patient not back to room and was sent from dialysis to specials for permanent hemodialysis placement.  Assessment completed post dialysis treatment and one time dose of ativan given by hemodialysis nurse per Dr. Arbutus Ped order. Patient respiratory status appeared unchanged.  Kelly accepting specials RN updated on patients respiratory status and steps taken to improve respiratory status.  Dr. Arbutus Ped paged and notified of unchanged respiratory status post hemodialysis and ativan.  Order received for ABG.  Dr. Arbutus Ped updated on ABG results via secure chat and per MD she advised for patient to be sent back to 234. Charge nurse Janett Billow and primary nurse Remo Lipps notified of information via secure chat as well.

## 2019-03-14 NOTE — Progress Notes (Signed)
RT called to patient bedside for assessment due to increased work of breathing and oxygen demand. Upon assessment patient has labored breathing with accessory muscle use, bilateral expiratory wheezes with coarse crackles, respirations in mid 20s.. Patient speaking in partial sentences. Patient placed on Bipap per NP order. Patient tolerating Bipap well. Respirations returned to 18bpm. SATs increased to 98%. Patient states he is comfortable and breathing is "feeling better". Will continue to monitor.

## 2019-03-14 NOTE — Progress Notes (Addendum)
Patient with increased work of breathing and wheezing non responsive to breathing treatments. Oxygen requirement now  L [place] without any relief of work of breathing symptoms.  Chest x ray with pulmonary edema. Noted to be on HD schedule today. Secondary to accessory muscle use and work of breathing, discussed with Dr Holley Raring  With nephrology and ordered 120 mg IV lasix to challenge kidneys . Bipap ordered to ease work of breathing. Patient transferred to step down level of care secondary to bipap need

## 2019-03-14 NOTE — H&P (Signed)
[facility] History & Physical Update  The patient was interviewed and re-examined.  The patient's previous History and Physical has been reviewed and is unchanged.  There is no change in the plan of care. We plan to proceed with the scheduled procedure.  Leotis Pain, MD  03/14/2019, 1:28 PM

## 2019-03-14 NOTE — Progress Notes (Addendum)
Dr. Arbutus Ped at bedside in dialysis unit.

## 2019-03-14 NOTE — Consult Note (Signed)
[place] for Heparin  Indication: atrial fibrillation- new onset  Patient Measurements: Height: 6' (182.9 cm) Weight: 210 lb 9.6 oz (95.5 kg) IBW/kg (Calculated) : 77.6 Heparin Dosing Weight: 86.2 kg   Vital Signs: Temp: 97.8 F (36.6 C) (03/11 0457) Temp Source: Oral (03/10 1957) BP: 162/96 (03/11 0457) Pulse Rate: 95 (03/11 0457)  Labs: Recent Labs    03/12/19 0432 03/12/19 0432 03/13/19 0417 03/13/19 1149 03/14/19 0520  HGB 7.7*   < >  --  8.2* 9.7*  HCT 22.9*  --   --  25.0* 29.0*  PLT 244  --   --  277 302  HEPARINUNFRC 0.37   < > 0.30 0.42 0.28*  CREATININE 5.29*  --  4.09*  --  3.17*   < > = values in this interval not displayed.    Estimated Creatinine Clearance: 28.6 mL/min (A) (by C-G formula based on SCr of 3.17 mg/dL (H)).  Medications:  Called RN to confirm whether pt has been on [facility] prior to admission. Nurse was not able to confirm as she was not in the patient's room. Per chart review, no AC prior to admission and confirmed with pharmacy technician who spoke to patient.   Heparin Course (since 3/9): 3/9 @ 0432 HL 0.37, therapeutic X1 3/10 @ 0417 HL 0.30, therapeutic X2 3/10 @ 1149 HL 0.42, therapeutic X 3  Assessment: Craig Koch is a 64 y.o. male with a past medical history of anxiety, CAD, diabetes, and hyperlipidemia, hypertension, lung cancer, presents to the emergency department for shortness of breath. Pharmacy was consulted for heparin dosing in a patient with new onset atrial fibrillation. H&H trending down slightly, platelets are stable   Patient is s/p 1 unit PRBC with HD today  Goal of Therapy:  Heparin level 0.3-0.7 units/ml Monitor platelets by anticoagulation protocol: Yes   Plan:  03/11 @ 0530 HL 0.28 subtherapeutic. Will increase rate slightly to 1650 units/hr and will recheck HL at 1200 and will continue to monitor.  Tobie Lords, PharmD, BCPS Clinical Pharmacist 03/14/2019 6:34  AM

## 2019-03-14 NOTE — Progress Notes (Signed)
PT Cancellation Note  Patient Details Name: Craig Koch MRN: [id number] DOB: [birth year]   Cancelled Treatment:    Reason Eval/Treat Not Completed: Patient at procedure or test/unavailable Pt out of room this AM for dialysis, plan to see him this afternoon as time and schedule allow.    Kreg Shropshire, DPT 03/14/2019, 10:30 AM

## 2019-03-14 NOTE — Progress Notes (Signed)
Called and gave report to the ICU RN at this time. Will call and inform HD nurse as well. Wenda Low [facility]

## 2019-03-15 ENCOUNTER — Inpatient Hospital Stay: Payer: Medicare Other

## 2019-03-15 ENCOUNTER — Inpatient Hospital Stay (HOSPITAL_COMMUNITY)
Admit: 2019-03-15 | Discharge: 2019-03-15 | Disposition: A | Discharge status: Home / Self Care | Payer: Medicare Other | Attending: Internal Medicine | Admitting: Internal Medicine

## 2019-03-15 DIAGNOSIS — I340 Nonrheumatic mitral (valve) insufficiency: Secondary | ICD-10-CM

## 2019-03-15 DIAGNOSIS — I361 Nonrheumatic tricuspid (valve) insufficiency: Secondary | ICD-10-CM

## 2019-03-15 LAB — PATHOLOGIST SMEAR REVIEW

## 2019-03-15 LAB — BASIC METABOLIC PANEL
Anion gap: 15 (ref 5–15)
BUN: 25 mg/dL — ABNORMAL HIGH (ref 8–23)
CO2: 24 mmol/L (ref 22–32)
Calcium: 8 mg/dL — ABNORMAL LOW (ref 8.9–10.3)
Chloride: 96 mmol/L — ABNORMAL LOW (ref 98–111)
Creatinine, Ser: 2.81 mg/dL — ABNORMAL HIGH (ref 0.61–1.24)
GFR calc Af Amer: 27 mL/min — ABNORMAL LOW (ref >60)
GFR calc non Af Amer: 23 mL/min — ABNORMAL LOW (ref >60)
Glucose, Bld: 159 mg/dL — ABNORMAL HIGH (ref 70–99)
Potassium: 3.8 mmol/L (ref 3.5–5.1)
Sodium: 135 mmol/L (ref 135–145)

## 2019-03-15 LAB — CBC
HCT: 29.6 % — ABNORMAL LOW (ref 39.0–52.0)
Hemoglobin: 9.3 g/dL — ABNORMAL LOW (ref 13.0–17.0)
MCH: 27.5 pg (ref 26.0–34.0)
MCHC: 31.4 g/dL (ref 30.0–36.0)
MCV: 87.6 fL (ref 80.0–100.0)
Platelets: 238 10*3/uL (ref 150–400)
RBC: 3.38 MIL/uL — ABNORMAL LOW (ref 4.22–5.81)
RDW: 18.9 % — ABNORMAL HIGH (ref 11.5–15.5)
WBC: 7 10*3/uL (ref 4.0–10.5)
nRBC: 0 % (ref 0.0–0.2)

## 2019-03-15 LAB — LACTATE DEHYDROGENASE, PLEURAL OR PERITONEAL FLUID: LD, Fluid: 73 U/L — ABNORMAL HIGH (ref 3–23)

## 2019-03-15 LAB — BODY FLUID CELL COUNT WITH DIFFERENTIAL
Eos, Fluid: 0 %
Lymphs, Fluid: 41 %
Monocyte-Macrophage-Serous Fluid: 56 %
Neutrophil Count, Fluid: 3 %
Total Nucleated Cell Count, Fluid: 138 cu mm

## 2019-03-15 LAB — GLUCOSE, CAPILLARY
Glucose-Capillary: 120 mg/dL — ABNORMAL HIGH (ref 70–99)
Glucose-Capillary: 98 mg/dL (ref 70–99)
Glucose-Capillary: 212 mg/dL — ABNORMAL HIGH (ref 70–99)

## 2019-03-15 LAB — ECHOCARDIOGRAM COMPLETE
Height: 72 in
Weight: 3222.4 oz

## 2019-03-15 LAB — PROTEIN, PLEURAL OR PERITONEAL FLUID: Total protein, fluid: <3 g/dL

## 2019-03-15 LAB — MRSA PCR SCREENING: MRSA by PCR: NEGATIVE

## 2019-03-15 MED ORDER — POTASSIUM CHLORIDE CRYS ER 10 MEQ PO TBCR
40.0000 meq | EXTENDED_RELEASE_TABLET | Freq: Once | ORAL | Status: AC
  Administered 2019-03-15: 40 meq via ORAL
  Filled 2019-03-15: qty 4

## 2019-03-15 NOTE — Progress Notes (Signed)
OT Cancellation Note  Patient Details Name: RUBY DILONE MRN: [id number] DOB: 08/01/1955   Cancelled Treatment:    Reason Eval/Treat Not Completed: Patient at procedure or test/ unavailable  Pt off floor at HD at this time. Will f/u as able for OT tx.  Gerrianne Scale, [place], OTR/L ascom [phone] 03/15/19, 2:58 PM

## 2019-03-15 NOTE — Progress Notes (Signed)
[facility], [place] 03/15/19  Subjective:  Patient underwent right-sided thoracentesis today for right pleural effusion. Tolerated procedure well. Due for dialysis treatment today.   Objective:  Vital signs in last 24 hours:  Temp:  [97.7 F (36.5 C)-98.1 F (36.7 C)] 97.8 F (36.6 C) (03/12 0744) Pulse Rate:  [70-96] 86 (03/12 1055) Resp:  [16-28] 19 (03/12 0744) BP: (100-126)/(48-69) 122/52 (03/12 1055) SpO2:  [92 %-100 %] 97 % (03/12 1055) FiO2 (%):  [40 %] 40 % (03/11 1404) Weight:  [91.4 kg-95.5 kg] 91.4 kg (03/12 0422)  Weight change: -0.027 kg Filed Weights   03/14/19 0350 03/14/19 1311 03/15/19 0422  Weight: 95.5 kg 95.5 kg 91.4 kg    Intake/Output:    Intake/Output Summary (Last 24 hours) at 03/15/2019 1222 Last data filed at 03/15/2019 1100 Gross per 24 hour  Intake 120 ml  Output 1550 ml  Net -1430 ml     Physical Exam: General:  Ill-appearing, laying in bed  HEENT  moist oral mucous membranes, decreased hearing  Pulm/lungs  diminished at right base, scattered rhonchi, normal effort  CVS/Heart  S1S2 irregular  Abdomen:   Soft, nontender, bowel sounds present  Extremities:  1+ pitting edema  Neurologic:  Awake, alert, conversant  Skin:  Warm, dry          Basic Metabolic Panel:  Recent Labs  Lab 03/10/19 0319 03/10/19 0319 03/11/19 4431 03/11/19 0643 03/12/19 0432 03/12/19 0432 03/12/19 1012 03/13/19 0417 03/14/19 0520 03/14/19 1133 03/15/19 0554  NA 130*   < > 130*  --  135  --   --  137 135  --  135  K 4.0   < > 3.7  --  3.9  --   --  3.5 4.5  --  3.8  CL 96*   < > 96*  --  95*  --   --  98 96*  --  96*  CO2 15*   < > 25  --  21*  --   --  27 24  --  24  GLUCOSE 155*   < > 146*  --  168*  --   --  228* 265*  --  159*  BUN 101*   < > 109*  --  81*  --   --  50* 33*  --  25*  CREATININE 6.09*   < > 6.41*  --  5.29*  --   --  4.09* 3.17*  --  2.81*  CALCIUM 7.5*   < > 7.3*   < > 7.5*   < >  --  7.6* 8.0*   --  8.0*  PHOS 5.0*  --   --   --   --   --  4.7*  --   --  2.8  --    < > = values in this interval not displayed.     CBC: Recent Labs  Lab 03/11/19 0643 03/12/19 0432 03/13/19 1149 03/14/19 0520 03/15/19 0554  WBC 7.1 5.5 6.3 6.9 7.0  HGB 8.0* 7.7* 8.2* 9.7* 9.3*  HCT 22.7* 22.9* 25.0* 29.0* 29.6*  MCV 77.2* 80.1 84.5 83.8 87.6  PLT 277 244 277 302 238      Lab Results  Component Value Date   HEPBSAG NON REACTIVE 03/11/2019   HEPBIGM NON REACTIVE 03/11/2019      Microbiology:  Recent Results (from the past 240 hour(s))  MRSA PCR Screening     Status: None   Collection Time:  03/15/19  9:08 AM   Specimen: Nasopharyngeal  Result Value Ref Range Status   MRSA by PCR NEGATIVE NEGATIVE Final    Comment:        The GeneXpert MRSA Assay (FDA approved for NASAL specimens only), is one component of a comprehensive MRSA colonization surveillance program. It is not intended to diagnose MRSA infection nor to guide or monitor treatment for MRSA infections. Performed at [facility], [address] [place], [place] [address]     Coagulation Studies: No results for input(s): LABPROT, INR in the last 72 hours.  Urinalysis: No results for input(s): COLORURINE, LABSPEC, PHURINE, GLUCOSEU, HGBUR, BILIRUBINUR, KETONESUR, PROTEINUR, UROBILINOGEN, NITRITE, LEUKOCYTESUR in the last 72 hours.  Invalid input(s): APPERANCEUR    Imaging: DG Chest 1 View  Result Date: 03/14/2019 CLINICAL DATA:  Wheezing EXAM: CHEST  1 VIEW COMPARISON:  03/03/2019 FINDINGS: Borderline heart size.  CABG. Diffuse interstitial opacity with Kerley lines and left more than right pleural effusion. No pneumothorax. IMPRESSION: CHF. Electronically Signed   By: Monte Fantasia M.D.   On: 03/14/2019 05:47   CT CHEST WO CONTRAST  Result Date: 03/14/2019 CLINICAL DATA:  Respiratory failure. EXAM: CT CHEST WITHOUT CONTRAST TECHNIQUE: Multidetector CT imaging of the chest was performed following  the standard protocol without IV contrast. COMPARISON:  Chest CT dated 01/22/2019. FINDINGS: Cardiovascular: Heart size appears stable. No pericardial effusion. Diffuse coronary artery calcifications. Surgical changes of median sternotomy for presumed CABG. Mediastinum/Nodes: No mass or enlarged lymph nodes seen within the mediastinum. Esophagus appears normal. Trachea is unremarkable. Lungs/Pleura: New RIGHT pleural effusion, moderate to large in size. Associated compressive atelectasis within the RIGHT perihilar and lower lobe. New ill-defined consolidation within the LEFT lower lobe, suspected pneumonia. Probable additional rounded atelectasis within the lingula. Diffuse interstitial thickening indicating some degree of volume overload versus atypical infection. Upper Abdomen: Limited images of the upper abdomen are unremarkable. Musculoskeletal: No acute or suspicious osseous finding. IMPRESSION: 1. New ill-defined consolidation within the LEFT lower lobe, suspected pneumonia. 2. New moderate to large-sized RIGHT pleural effusion, with associated compressive atelectasis. 3. Diffuse interstitial thickening, most likely edema/fluid overload, less likely additional atypical infection. 4. Probable additional rounded atelectasis within the lingula. Aortic Atherosclerosis (ICD10-I70.0). Electronically Signed   By: Franki Cabot M.D.   On: 03/14/2019 19:47   PERIPHERAL VASCULAR CATHETERIZATION  Result Date: 03/14/2019 See op note  DG Chest Port 1 View  Result Date: 03/15/2019 CLINICAL DATA:  Status post thoracentesis. EXAM: PORTABLE CHEST 1 VIEW COMPARISON:  03/14/2019 FINDINGS: Interval decrease right pleural effusion. No evidence for pneumothorax. Bibasilar atelectasis/infiltrate again noted with persistent small left pleural effusion. The cardiopericardial silhouette is within normal limits for size. Pulmonary vascular congestion has decreased in the interval. Right IJ central line tip overlies the mid to  distal SVC. The visualized bony structures of the thorax are intact. Telemetry leads overlie the chest. IMPRESSION: Interval decrease in right pleural effusion without evidence of pneumothorax. Decrease in pulmonary vascular congestion with persistent bibasilar atelectasis/infiltrate and small left pleural effusion. Electronically Signed   By: Misty Stanley M.D.   On: 03/15/2019 11:36   ECHOCARDIOGRAM COMPLETE  Result Date: 03/15/2019    ECHOCARDIOGRAM REPORT   Patient Name:   Craig Koch Date of Exam: 03/15/2019 Medical Rec #:  [id number]       Height:       72.0 in Accession #:    [id number]      Weight:       201.4 lb Date  of Birth:  April 08, 1955       BSA:          2.137 m Patient Age:    64 years        BP:           125/69 mmHg Patient Gender: M               HR:           83 bpm. Exam Location:  [facility] Procedure: 2D Echo, Cardiac Doppler and Color Doppler Indications:     Respiratory distress  History:         Patient has prior history of Echocardiogram examinations, most                  recent 03/04/2019. Prior CABG; Risk Factors:Hypertension and                  Diabetes.  Sonographer:     Sherrie Sport RDCS (AE) Referring Phys:  [id number] Claiborne Billings A GRIFFITH Diagnosing Phys: Kathlyn Sacramento MD IMPRESSIONS  1. Left ventricular ejection fraction, by estimation, is 35 to 40%. The left ventricle has moderately decreased function. The left ventricle demonstrates regional wall motion abnormalities . The left ventricular internal cavity size was mildly dilated. There is mild left ventricular hypertrophy. Left ventricular diastolic parameters are consistent with Grade II diastolic dysfunction (pseudonormalization). There is moderate hypokinesis of the left ventricular, entire inferior wall.  2. Right ventricular systolic function is normal. The right ventricular size is normal. There is moderately elevated pulmonary artery systolic pressure. The estimated right ventricular systolic pressure is 43.3 mmHg.  3. Left atrial  size was mildly dilated.  4. The mitral valve is normal in structure. Moderate mitral valve regurgitation. No evidence of mitral stenosis.  5. The aortic valve is abnormal. Aortic valve regurgitation is mild. Mild to moderate aortic valve sclerosis/calcification is present, without any evidence of aortic stenosis.  6. The inferior vena cava is normal in size with greater than 50% respiratory variability, suggesting right atrial pressure of 3 mmHg. FINDINGS  Left Ventricle: Left ventricular ejection fraction, by estimation, is 35 to 40%. The left ventricle has moderately decreased function. The left ventricle demonstrates regional wall motion abnormalities. Moderate hypokinesis of the left ventricular, entire inferior wall. The left ventricular internal cavity size was mildly dilated. There is mild left ventricular hypertrophy. Left ventricular diastolic parameters are consistent with Grade II diastolic dysfunction (pseudonormalization). Right Ventricle: The right ventricular size is normal. No increase in right ventricular wall thickness. Right ventricular systolic function is normal. There is moderately elevated pulmonary artery systolic pressure. The tricuspid regurgitant velocity is 3.37 m/s, and with an assumed right atrial pressure of 3 mmHg, the estimated right ventricular systolic pressure is 29.5 mmHg. Left Atrium: Left atrial size was mildly dilated. Right Atrium: Right atrial size was normal in size. Pericardium: There is no evidence of pericardial effusion. Mitral Valve: The mitral valve is normal in structure. Normal mobility of the mitral valve leaflets. Moderate mitral valve regurgitation. No evidence of mitral valve stenosis. Tricuspid Valve: The tricuspid valve is normal in structure. Tricuspid valve regurgitation is mild . No evidence of tricuspid stenosis. Aortic Valve: The aortic valve is abnormal. Aortic valve regurgitation is mild. Mild to moderate aortic valve sclerosis/calcification is  present, without any evidence of aortic stenosis. Aortic valve mean gradient measures 4.5 mmHg. Aortic valve peak gradient measures 8.4 mmHg. Aortic valve area, by VTI measures 2.54 cm. Pulmonic Valve: The pulmonic valve was  normal in structure. Pulmonic valve regurgitation is not visualized. No evidence of pulmonic stenosis. Aorta: The aortic root is normal in size and structure. Venous: The inferior vena cava is normal in size with greater than 50% respiratory variability, suggesting right atrial pressure of 3 mmHg. IAS/Shunts: No atrial level shunt detected by color flow Doppler.  LEFT VENTRICLE PLAX 2D LVIDd:         5.34 cm      Diastology LVIDs:         4.38 cm      LV e' lateral:   5.77 cm/s LV PW:         1.00 cm      LV E/e' lateral: 22.4 LV IVS:        1.20 cm      LV e' medial:    5.00 cm/s LVOT diam:     2.00 cm      LV E/e' medial:  25.8 LV SV:         63 LV SV Index:   30 LVOT Area:     3.14 cm  LV Volumes (MOD) LV vol d, MOD A2C: 227.0 ml LV vol d, MOD A4C: 148.0 ml LV vol s, MOD A2C: 142.0 ml LV vol s, MOD A4C: 110.0 ml LV SV MOD A2C:     85.0 ml LV SV MOD A4C:     148.0 ml LV SV MOD BP:      59.1 ml RIGHT VENTRICLE RV Basal diam:  4.14 cm RV S prime:     10.30 cm/s TAPSE (M-mode): 3.2 cm LEFT ATRIUM             Index       RIGHT ATRIUM           Index LA diam:        4.80 cm 2.25 cm/m  RA Area:     15.70 cm LA Vol (A2C):   68.8 ml 32.20 ml/m RA Volume:   40.00 ml  18.72 ml/m LA Vol (A4C):   77.9 ml 36.45 ml/m LA Biplane Vol: 75.1 ml 35.14 ml/m  AORTIC VALVE                   PULMONIC VALVE AV Area (Vmax):    2.41 cm    PV Vmax:        0.99 m/s AV Area (Vmean):   2.15 cm    PV Peak grad:   3.9 mmHg AV Area (VTI):     2.54 cm    RVOT Peak grad: 4 mmHg AV Vmax:           144.50 cm/s AV Vmean:          94.950 cm/s AV VTI:            0.249 m AV Peak Grad:      8.4 mmHg AV Mean Grad:      4.5 mmHg LVOT Vmax:         111.00 cm/s LVOT Vmean:        65.100 cm/s LVOT VTI:          0.201 m LVOT/AV  VTI ratio: 0.81  AORTA Ao Root diam: 2.90 cm MITRAL VALVE                TRICUSPID VALVE MV Area (PHT): 5.13 cm     TR Peak grad:   45.4 mmHg MV Decel Time: 148 msec     TR Vmax:  337.00 cm/s MV E velocity: 129.00 cm/s MV A velocity: 68.40 cm/s   SHUNTS MV E/A ratio:  1.89         Systemic VTI:  0.20 m                             Systemic Diam: 2.00 cm Kathlyn Sacramento MD Electronically signed by Kathlyn Sacramento MD Signature Date/Time: 03/15/2019/11:27:06 AM    Final      Medications:   . piperacillin-tazobactam (ZOSYN)  IV Stopped (03/15/19 0207)  . small volume/piggyback builder Stopped (03/14/19 1805)   . sodium chloride  250 mL Intravenous Once  . amiodarone  200 mg Oral BID   Followed by  . [START ON 03/19/2019] amiodarone  200 mg Oral Daily  . apixaban  5 mg Oral BID  . aspirin EC  81 mg Oral Daily  . chlorhexidine  15 mL Mouth Rinse BID  . Chlorhexidine Gluconate Cloth  6 each Topical Q0600  . dextromethorphan-guaiFENesin  1 tablet Oral BID  . dronabinol  5 mg Oral BID AC  . DULoxetine  30 mg Oral Daily  . ferrous sulfate  325 mg Oral BID WC  . insulin aspart  0-5 Units Subcutaneous QHS  . insulin aspart  0-9 Units Subcutaneous TID WC  . insulin detemir  20 Units Subcutaneous QHS  . ipratropium-albuterol  3 mL Nebulization TID  . mouth rinse  15 mL Mouth Rinse q12n4p  . metoprolol tartrate  12.5 mg Oral BID  . multivitamin  1 tablet Oral QHS  . multivitamin with minerals  1 tablet Oral Daily  . nicotine  21 mg Transdermal Daily  . oxymetazoline  1 spray Each Nare BID  . polyethylene glycol  17 g Oral Daily  . potassium chloride  40 mEq Oral Once  . Ensure Max Protein  11 oz Oral BID BM  . rosuvastatin  10 mg Oral Daily  . sodium bicarbonate  1,300 mg Oral BID  . sodium chloride flush  10-40 mL Intracatheter Q12H   acetaminophen, albuterol, dextrose, heparin lock flush, heparin lock flush, HYDROmorphone (DILAUDID) injection, menthol-cetylpyridinium, ondansetron (ZOFRAN)  IV, ondansetron (ZOFRAN) IV, pneumococcal 23 valent vaccine, sodium chloride, sodium chloride flush, sodium chloride flush  Assessment/ Plan:  64 y.o. male with hypertension, hyperlipidemia, diabetes, depression, anxiety, adenocarcinoma of the lung stage IV, coronary disease, history of CABG, tobacco and h/o alcohol abuse, left pleural effusion,  admitted on 03/03/2019 for DKA (diabetic ketoacidoses) ([place]) [E11.10] New onset atrial fibrillation ([place]) [I48.91] Atrial fibrillation with rapid ventricular response ([place]) [I48.91] Diabetic ketoacidosis without coma associated with other specified diabetes mellitus ([place]) [E13.10]     #Acute kidney injury, Metabolic acidosis Baseline creatinine of 0.7 on February 5.  Patient did have IV contrast exposure on January 19. Renal imaging: Kidney ultrasound March 1: Normal kidneys  Acute kidney injury is likely secondary to ATN caused by hypotension, hemodynamic instability.   Renal placement therapy initiated earlier this week.  Urine output does appear to be improving at the moment however as urine output was 1.2 L over the preceding 24 hours.  Continue to monitor renal parameters over the weekend.  PermCath was placed yesterday.  If urine output continues to increase consider removing it on Monday.   #Hyponatremia Sodium improved with dialysis.  Currently 135.  #Diabetic ketoacidosis, poorly controlled diabetes Management as per ICU team Lab Results  Component Value Date   HGBA1C 11.3 (H) 03/04/2019   Multiple  underlying chronic illnesses, poorly controlled diabetes, reported history of tobacco and h/o alcohol abuse, with underlying coronary disease, history of CABG, CHF with EF 30 to 35%, stage IV adenocarcinoma  # Hypokalemia Potassium currently 3.8.  Continue to creatinine monitor.    LOS: 12 Cheston Coury 3/12/202112:22 PM  [place], [place]  Note: This note was prepared with Dragon  dictation. Any transcription errors are unintentional

## 2019-03-15 NOTE — Progress Notes (Signed)
HD Initiated    03/15/19 1136  Vital Signs  Temp 98.6 F (37 C)  Temp Source Axillary  Pulse Rate 74  Pulse Rate Source Dinamap  Resp (!) 26  BP (!) 109/51  BP Location Left Arm  BP Method Automatic  Patient Position (if appropriate) Lying  Oxygen Therapy  SpO2 100 %  O2 Device Nasal Cannula  O2 Flow Rate (L/min) 2 L/min  Pain Assessment  Pain Scale 0-10  Pain Score 0  During Hemodialysis Assessment  Blood Flow Rate (mL/min) 300 mL/min  Arterial Pressure (mmHg) -170 mmHg  Venous Pressure (mmHg) 170 mmHg  Transmembrane Pressure (mmHg) 50 mmHg  Ultrafiltration Rate (mL/min) 670 mL/min  Dialysate Flow Rate (mL/min) 600 ml/min  Conductivity: Machine  14.2  HD Safety Checks Performed Yes  Dialysis Fluid Bolus Normal Saline  Bolus Amount (mL) 250 mL  Intra-Hemodialysis Comments Tx initiated     03/15/19 1136  Vital Signs  Temp 98.6 F (37 C)  Temp Source Axillary  Pulse Rate 74  Pulse Rate Source Dinamap  Resp (!) 26  BP (!) 109/51  BP Location Left Arm  BP Method Automatic  Patient Position (if appropriate) Lying  Oxygen Therapy  SpO2 100 %  O2 Device Nasal Cannula  O2 Flow Rate (L/min) 2 L/min  Pain Assessment  Pain Scale 0-10  Pain Score 0  During Hemodialysis Assessment  Blood Flow Rate (mL/min) 300 mL/min  Arterial Pressure (mmHg) -170 mmHg  Venous Pressure (mmHg) 170 mmHg  Transmembrane Pressure (mmHg) 50 mmHg  Ultrafiltration Rate (mL/min) 670 mL/min  Dialysate Flow Rate (mL/min) 600 ml/min  Conductivity: Machine  14.2  HD Safety Checks Performed Yes  Dialysis Fluid Bolus Normal Saline  Bolus Amount (mL) 250 mL  Intra-Hemodialysis Comments Tx initiated

## 2019-03-15 NOTE — Progress Notes (Signed)
New referral for [facility] community Palliative to follow post discharge received from Kathie Rhodes NP. Discharge location to be determined. TOC Bridget Cobb aware. Patient information given to referral. Flo Shanks BSN, RN, [place] [phone]

## 2019-03-15 NOTE — Procedures (Signed)
PROCEDURE SUMMARY:  Successful US guided right thoracentesis. Yielded 2.7 L of clear yellow fluid. Patient tolerated procedure well. No immediate complications. EBL = trace  Specimen was sent for labs.  Post procedure chest X-ray reveals no pneumothorax  Pearlene Teat S Kalene Cutler PA-C 03/15/2019 12:44 PM

## 2019-03-15 NOTE — Progress Notes (Signed)
HD Complete    03/15/19 1447  Hand-Off documentation  Report given to (Full Name) Zada Girt, RN  Report received from (Full Name) Gustavo Meditz, RN  Vital Signs  Temp 97.6 F (36.4 C)  Temp Source Axillary  Pulse Rate 74  Pulse Rate Source Dinamap  Resp (!) 21  BP (!) 109/47  BP Location Left Arm  BP Method Automatic  Patient Position (if appropriate) Lying  Oxygen Therapy  SpO2 100 %  O2 Device Nasal Cannula  O2 Flow Rate (L/min) 2 L/min  During Hemodialysis Assessment  HD Safety Checks Performed Yes  Dialysis Fluid Bolus Normal Saline  Bolus Amount (mL) 250 mL  Intra-Hemodialysis Comments Tx completed

## 2019-03-15 NOTE — Progress Notes (Signed)
Pre HD     03/15/19 1136  Vital Signs  Temp 98.6 F (37 C)  Temp Source Axillary  Pulse Rate 74  Pulse Rate Source Dinamap  Resp (!) 26  BP (!) 109/51  BP Location Left Arm  BP Method Automatic  Patient Position (if appropriate) Lying  Oxygen Therapy  SpO2 100 %  O2 Device Nasal Cannula  O2 Flow Rate (L/min) 2 L/min  Pain Assessment  Pain Scale 0-10  Pain Score 0  Dialysis Weight  Weight  (unable to weigh pt )  Type of Weight Pre-Dialysis  Time-Out for Hemodialysis  What Procedure? HD   Pt Identifiers(min of two) First/Last Name;MRN/Account#;Pt's DOB(use if MRN/Acct# not available  Correct Site? Yes  Correct Side? Yes  Correct Procedure? Yes  Consents Verified? Yes  Rad Studies Available? N/A  Safety Precautions Reviewed? Yes  Engineer, civil (consulting) Number 1  Station Number 4  UF/Alarm Test Passed  Conductivity: Meter 14  Conductivity: Machine  14.2  pH 7.2  Reverse Osmosis main   Normal Saline Lot Number [id number]  Dialyzer Lot Number [id number]  Disposable Set Lot Number [id number]  Machine Temperature 98.6 F (37 C)  Musician and Audible Yes  Blood Lines Intact and Secured Yes  Pre Treatment Patient Checks  Vascular access used during treatment Catheter  HD catheter dressing before treatment WDL  Patient is receiving dialysis in a chair  (no)  Hepatitis B Surface Antigen Results Negative  Date Hepatitis B Surface Antigen Drawn 03/11/19  Isolation Initiated Yes  Hepatitis B Surface Antibody  (<10)  Date Hepatitis B Surface Antibody Drawn 03/11/19  Hemodialysis Consent Verified Yes  Hemodialysis Standing Orders Initiated Yes  ECG (Telemetry) Monitor On Yes  Prime Ordered Normal Saline  Length of  DialysisTreatment -hour(s) 3 Hour(s)  Dialysis Treatment Comments  (NA 140)  Dialyzer Elisio 17H NR  Dialysate 3K;2.5 Ca  Dialysate Flow Ordered 600  Blood Flow Rate Ordered 300 mL/min  Ultrafiltration Goal 1.5 Liters  Dialysis Blood Pressure  Support Ordered Normal Saline

## 2019-03-15 NOTE — Progress Notes (Signed)
*  PRELIMINARY RESULTS* Echocardiogram 2D Echocardiogram has been performed.  Sherrie Sport 03/15/2019, 8:47 AM

## 2019-03-15 NOTE — Progress Notes (Signed)
Pre HD Assessment    03/15/19 1132  Neurological  Level of Consciousness Alert  Orientation Level Oriented X4  Respiratory  Respiratory Pattern Regular;Unlabored  Chest Assessment Chest expansion symmetrical  Bilateral Breath Sounds Diminished  Cardiac  Pulse Regular  Heart Sounds S1, S2  Integumentary  Integumentary (WDL) X  Musculoskeletal  Musculoskeletal (WDL) X  Psychosocial  Psychosocial (WDL) WDL

## 2019-03-15 NOTE — Progress Notes (Signed)
Daily Progress Note   Patient Name: Craig Koch       Date: 03/15/2019 DOB: 09-30-1955  Age: 64 y.o. MRN#: [id number] Attending Physician: Craig Slocumb, DO Primary Care Physician: Craig Ghent, MD Admit Date: 03/03/2019  Reason for Consultation/Follow-up: Establishing goals of care  Subjective: Dr. Arbutus Koch at bedside - discussing plans for today - HD and possible thoracentesis Patient feels short of breath but better than yesterday - not requiring bipap  Length of Stay: 12  Current Medications: Scheduled Meds:  . sodium chloride  250 mL Intravenous Once  . amiodarone  200 mg Oral BID   Followed by  . [START ON 03/19/2019] amiodarone  200 mg Oral Daily  . apixaban  5 mg Oral BID  . aspirin EC  81 mg Oral Daily  . chlorhexidine  15 mL Mouth Rinse BID  . Chlorhexidine Gluconate Cloth  6 each Topical Q0600  . dextromethorphan-guaiFENesin  1 tablet Oral BID  . dronabinol  5 mg Oral BID AC  . DULoxetine  30 mg Oral Daily  . ferrous sulfate  325 mg Oral BID WC  . insulin aspart  0-5 Units Subcutaneous QHS  . insulin aspart  0-9 Units Subcutaneous TID WC  . insulin detemir  20 Units Subcutaneous QHS  . ipratropium-albuterol  3 mL Nebulization TID  . mouth rinse  15 mL Mouth Rinse q12n4p  . metoprolol tartrate  12.5 mg Oral BID  . multivitamin  1 tablet Oral QHS  . multivitamin with minerals  1 tablet Oral Daily  . nicotine  21 mg Transdermal Daily  . oxymetazoline  1 spray Each Nare BID  . polyethylene glycol  17 g Oral Daily  . Ensure Max Protein  11 oz Oral BID BM  . rosuvastatin  10 mg Oral Daily  . sodium bicarbonate  1,300 mg Oral BID  . sodium chloride flush  10-40 mL Intracatheter Q12H    Continuous Infusions: . piperacillin-tazobactam (ZOSYN)  IV Stopped (03/15/19 0207)    . small volume/piggyback builder Stopped (03/14/19 1805)    PRN Meds: acetaminophen, albuterol, dextrose, heparin lock flush, heparin lock flush, HYDROmorphone (DILAUDID) injection, menthol-cetylpyridinium, ondansetron (ZOFRAN) IV, ondansetron (ZOFRAN) IV, pneumococcal 23 valent vaccine, sodium chloride, sodium chloride flush, sodium chloride flush  Physical Exam Constitutional:      General: He is not in acute distress. Pulmonary:     Comments: Slight tachypnea, slightly increased WOB Skin:    General: Skin is warm and dry.  Neurological:     Mental Status: He is alert and oriented to person, place, and time.  Psychiatric:        Mood and Affect: Mood normal.        Behavior: Behavior normal.             Vital Signs: BP (!) 122/52 (BP Location: Left Arm)   Pulse 86   Temp 97.8 F (36.6 C) (Oral)   Resp 19   Ht 6' (1.829 m)   Wt 91.4 kg   SpO2 97%   BMI 27.31 kg/m  SpO2: SpO2: 97 % O2 Device: O2 Device: Nasal Cannula O2 Flow Rate: O2 Flow Rate (L/min): 3 L/min  Intake/output summary:  Intake/Output Summary (Last 24 hours) at 03/15/2019 1152 Last data filed at 03/15/2019 1100 Gross per 24 hour  Intake 120 ml  Output 1550 ml  Net -1430 ml   LBM: Last BM Date: 03/13/19 Baseline Weight: Weight: 86.2 kg Most recent weight: Weight: 91.4 kg       Palliative Assessment/Data: PPS 40%    Flowsheet Rows     Most Recent Value  Intake Tab  Referral Department  Hospitalist  Unit at Time of Referral  ICU  Palliative Care Primary Diagnosis  Cancer  Date Notified  03/04/19  Palliative Care Type  New Palliative care  Reason for referral  Non-pain Symptom  Date of Admission  03/03/19  Date first seen by Palliative Care  03/04/19  # of days Palliative referral response time  0 Day(s)  # of days IP prior to Palliative referral  1  Clinical Assessment  Psychosocial & Spiritual Assessment  Palliative Care Outcomes      Patient Active Problem List   Diagnosis Date  Noted  . AKI (acute kidney injury) ([place])   . Acute systolic heart failure ([place])   . Pain of right upper extremity   . Microcytic anemia   . Protein-calorie malnutrition, severe 03/06/2019  . Acute alteration in mental status   . Palliative care encounter   . Atrial fibrillation with RVR ([place]) 03/03/2019  . DKA (diabetic ketoacidoses) ([place]) 03/03/2019  . Diabetes mellitus without complication ([place]) 30/16/0109  . Hypothermia 03/03/2019  . SOB (shortness of breath) 03/03/2019  . Acute renal failure ([place]) 03/03/2019  . NSTEMI (non-ST elevated myocardial infarction) ([place]) 03/03/2019  . Hyponatremia 02/08/2019  . Uncontrolled type 2 diabetes mellitus with hyperglycemia ([place]) 02/08/2019  . Leg swelling 02/08/2019  . Paronychia, finger 12/19/2018  . Encounter for antineoplastic chemotherapy 12/08/2018  . Tobacco abuse 10/12/2018  . Malignant pleural effusion   . Administrative encounter 09/23/2018  . lung adenocarcinoma 09/08/2018  . Goals of care, counseling/discussion 09/08/2018  . Recurrent pleural effusion on left 09/02/2018  . Elevated troponin 08/24/2018  . Medicare annual wellness visit, subsequent 08/07/2018  . Lower urinary tract symptoms (LUTS) 08/07/2018  . Neck pain 08/07/2018  . CAD (coronary artery disease), native coronary artery 09/23/2017  . Smoker 09/23/2017  . Chronic joint pain 10/18/2016  . Healthcare maintenance 07/17/2016  . Advance care planning 07/17/2016  . Fungal rash of torso 05/06/2016  . Trigger finger 05/06/2016  . History of alcohol use 11/17/2015  . Skin lesion 05/19/2015  . Anxiety state 02/19/2015  . Hand weakness 05/23/2011  . Diabetes mellitus with complication ([place]) 32/35/5732  . HLD (hyperlipidemia) 03/05/2010  . Essential hypertension 03/05/2010  . Coronary atherosclerosis 03/05/2010    Palliative Care Assessment & Plan   HPI: Craig Bakken Gerringeris a 64 y.o.malewith multiple medical problems including stage IV adenocarcinoma of the lung  with history of left sided malignant pleural effusion requiring Pleurx(later removed). Patient has opted not to pursue chemotherapy and has instead been treated on immunotherapy. Patient has had persistent shortness of breath. CTA of the chest on 11/02/2018 revealed marked increase in tumor encasing the entire left lung with new diffuse increased interstitial thickening concerning for disease progression and lymphangitic spread of disease.Repeat CT of the chest and abdomen on 01/22/2019 revealed significant interval decreaseoftumor burden on the lung. Patient was admitted to the hospital on 03/03/2019 with shortness of breath and weakness. He was found to have non-STEMI and DKA. Patient was referred to palliative care to help address goals and manage ongoing  symptoms.  Assessment: Checked in with patient - he is anxious about having thoracentesis - ready to have it for symptom relief.  Discussed ongoing dialysis - he tells me he would like to continue with it and is open to "whatever will help". We discuss plans for discharge to rehab - he is agreeable to this. I suggest palliative to follow him at rehab and his is agreeable to this as well.   Called wife, Georga Kaufmann. Provided update and all questions answered. She is concerned about patient's continued problems with respiratory failure - discussed the multiple reasons for this - lung cancer, heart failure, kidney failure. She is also interested in continued follow up by palliative at rehab.   Recommendations/Plan:  Continue current care including HD - patient is interested in all offered medical interventions to prolong life with limitation of DNR  Palliative to follow at rehab - [facility] aware  PMT will not be here over the weekend - will check in next week  Prognosis:   Unable to determine  Discharge Planning:  [place] for rehab with Palliative care service follow-up  Care plan was discussed with patient and wife,  TOC  Thank you for allowing the Palliative Medicine Team to assist in the care of this patient.   Total Time 35 minutes Prolonged Time Billed  no       Greater than 50%  of this time was spent counseling and coordinating care related to the above assessment and plan.  Juel Burrow, DNP, [facility] Palliative Medicine Team Team Phone # [phone]  Pager [phone]

## 2019-03-15 NOTE — Plan of Care (Signed)
  Problem: Education: Goal: Knowledge of General Education information will improve Description: Including pain rating scale, medication(s)/side effects and non-pharmacologic comfort measures Outcome: Progressing   Problem: Clinical Measurements: Goal: Ability to maintain clinical measurements within normal limits will improve Outcome: Progressing Goal: Will remain free from infection Outcome: Progressing Goal: Diagnostic test results will improve Outcome: Progressing Goal: Respiratory complications will improve Outcome: Progressing Goal: Cardiovascular complication will be avoided Outcome: Progressing   Problem: Safety: Goal: Ability to remain free from injury will improve Outcome: Progressing

## 2019-03-15 NOTE — Progress Notes (Signed)
PROGRESS NOTE    Craig Koch  IYM:[id number] DOB: [birth year] DOA: 03/03/2019  PCP: Tonia Ghent, MD    LOS - 12   Brief Narrative:  64 year old male with hypertension, hyperlipidemia, type 2 diabetes mellitus, anxiety depression, stage IV lung cancer, CAD with history of CABG, tobacco and alcohol use who presented with generalized weakness and shortness of breath. In the ED he was found to be in DKA, AKI with creatinine of 1.6, new onset A. fib with RVR. Required BiPAP and admission to stepdown unit.  Hospital course prolonged due to persistent encephalopathy, rapid A. fib, and ATN.  Increased work of breathing on 3/11 with CXR showing acute pulmonary edema, given IV Lasix and dialyzed without much improvement.  ABG normal, never dropped oxygen sats.  No fevers or leukocytosis.  Dry chest CT showed a new mod/large right pleural effusion and new LLL infiltrate.  Started on Vanc/Zosyn.  Repeat echo pending.  US guided thoracentesis ordered.  Subjective 3/12: Patient seen this AM before dialysis.  He reports his shortness of breath that started early yesterday continues.  He did not need to use bipap overnight.  He is agreeable to thoracentesis, asks if it can be done before dialysis.  Assessment & Plan:   Principal Problem:   Atrial fibrillation with RVR (HCC) Active Problems:   HLD (hyperlipidemia)   Essential hypertension   History of alcohol use   CAD (coronary artery disease), native coronary artery   lung adenocarcinoma   Tobacco abuse   Uncontrolled type 2 diabetes mellitus with hyperglycemia (HCC)   DKA (diabetic ketoacidoses) (HCC)   Hypothermia   SOB (shortness of breath)   Acute renal failure (HCC)   NSTEMI (non-ST elevated myocardial infarction) (HCC)   Acute alteration in mental status   Palliative care encounter   Protein-calorie malnutrition, severe   Acute systolic heart failure (HCC)   Pain of right upper extremity   Microcytic anemia   AKI (acute kidney  injury) ([place])   Acute Respiratory Failure with Hypoxia due to Acute Decompensation of HFrEF /volume overload Left Lower Lobe Pneumonia - new 03/14/19, possibly HCAP Moderate to Large Right Pleural Effusion - seen on CT chest 3/11, likely cause of acute respiratory distress that started early AM of 3/11. CXR with pulmonary edema, placed on bipap.  Given 120 mg IV Lasix per nephrology.  Echo of 03/04/19 showed EF 30-35% with moderate MR. No wheezing on exam to suggest bronchospasm.  Chest x-ray personally reviewed and no appreciable infiltrates seen, appears consistent with pulmonary edema.  ABG normal.  Noncontrast CT chest shows new consolidation in LLL and moderate to large pleural effusion with compressive atelectasis. --can d/c Vancomycin (negative MRSA screen) --continue Zosyn --Supplemental oxygen to maintain O2 sat greater than 90% --BiPAP as needed during sleep --limit volume as much as possible --f/u repeat Echo   Right Pleural Effusion - likely the cause of patient's respiratory distress, in addition to LLL infiltrate. Patient's prior malignant effusions were left-sided (lung cancer also on left), loculated, required chest tube at home for some time. --US guided thoracentesis ordered --follow up fluid studies  AKI secondary toAcuteTubularNecrosis Baseline Cr 0.7. Secondary to hypovolemia. Now in intermittent hemodialysis. Right femoraltemporary dialysiscatheter placed (started on 3/8, 3rd session today 3/10). Poor UO past 24 hours (400cc), likely will need longer term dialysis while AKI recovers. Evaluation thus far: normalPTH, MPO/PR-3 antibody, GBM, complements. --nephrology following --TOC following for outpatient dialysis set up --f/u SPEP, UPEP --dialysis per nephro  New onset atrial  fibrillation with RVR- paroxysmal Has been in sinus for past few days. --Cardiology following --amiodarone 200 mg PO BID x 7 doses, then 200 mg daily starting 3/16 --transitioned  off heparin gtt to Elquis 3/11 --continue low dose metoprolol  Stage IV lung adenocarcinoma Dr. Tasia Catchings following him in the hospital. Recommend to hold dabrafenib and trametinib. --Palliative care consulted.   --[facility] care to follow after discharge.  Acute metabolic encephalopathy- resolved Possibly related to alcohol use. Antibiotic discontinued.  Acute systolic CHF (EF 13-24%) NSTEMI Presented with markedly elevated troponin and suspect NSTEMI with respiratory failure and DKA. --Cardiology following --off heparin gtt --continue beta blocker, statin --Per cardiology, consider Plavix on discharge and discontinue aspirin --further ischemic evaluation pending resolution of acute renal failure --monitor volume status closely: strict I/O's, daily weights  Uncontrolled type II diabetes mellitus with DKA DKA resolved on insulin drip. Patient became hypoglycemic on 3/5 possibly due to increased dose of Lantus and worsened renal function. Off insulin and being monitored on sliding scale only. CBG stable.  Alcohol abuse No withdrawal symptoms.  Tobacco abuse Nicotine patch.  Iron deficiency anemia Noted for drop in H&H. PRBC transfusion planned for 3/10 with HD.  Hypothermia Secondary to DKA. Resolved    DVT prophylaxis: on Eliquis   Code Status: DNR  Family Communication: none at bedside during encounter, will call wife   Disposition Plan:  D/C to SNF pending further clinical improvement and clearance by consultants.  Patient with acute onset of respiratory distress on 3/11 with ongoing evaluation and management.  Also will require being set up for outpatient dialysis prior to discharge Coming From home Exp DC Date 3/14 Barriers as above Medically Stable for Discharge? No   Consultants:   Nephrology  Cardiology  Vascular surgery  Palliative Care  Oncology  Procedures:   Dialysis  PermCath placed on  03/14/19  Antimicrobials:   Zosyn    Objective: Vitals:   03/14/19 2046 03/15/19 0422 03/15/19 0716 03/15/19 0744  BP:  122/60  126/69  Pulse:  77  83  Resp:  20  19  Temp:  97.7 F (36.5 C)  97.8 F (36.6 C)  TempSrc:  Oral  Oral  SpO2: 96% 94% 92% 96%  Weight:  91.4 kg    Height:        Intake/Output Summary (Last 24 hours) at 03/15/2019 0757 Last data filed at 03/15/2019 0431 Gross per 24 hour  Intake --  Output 1250 ml  Net -1250 ml   Filed Weights   03/14/19 0350 03/14/19 1311 03/15/19 0422  Weight: 95.5 kg 95.5 kg 91.4 kg    Examination:  General exam: awake, alert, no acute distress, chronically ill appearing HEENT: atraumatic, clear conjunctiva, anicteric sclera, moist mucus membranes, hearing grossly normal  Respiratory system: diminished breath sounds but improved, no wheezes, rales or rhonchi, increased respiratory effort with accessory muscle use. Cardiovascular system: normal S1/S2, RRR, no pedal edema.   Gastrointestinal system: soft, NT, ND, no HSM felt, +bowel sounds. Central nervous system: A&O x4. no gross focal neurologic deficits, normal speech Extremities: moves all, no cyanosis, normal tone Skin: dry, intact, normal temperature Psychiatry: normal mood, congruent affect, judgement and insight appear normal    Data Reviewed: I have personally reviewed following labs and imaging studies  CBC: Recent Labs  Lab 03/11/19 0643 03/12/19 0432 03/13/19 1149 03/14/19 0520 03/15/19 0554  WBC 7.1 5.5 6.3 6.9 7.0  HGB 8.0* 7.7* 8.2* 9.7* 9.3*  HCT 22.7* 22.9*  25.0* 29.0* 29.6*  MCV 77.2* 80.1 84.5 83.8 87.6  PLT 277 244 277 302 474   Basic Metabolic Panel: Recent Labs  Lab 03/10/19 0319 03/10/19 0319 03/11/19 0643 03/12/19 0432 03/12/19 1012 03/13/19 0417 03/14/19 0520 03/14/19 1133 03/15/19 0554  NA 130*   < > 130* 135  --  137 135  --  135  K 4.0   < > 3.7 3.9  --  3.5 4.5  --  3.8  CL 96*   < > 96* 95*  --  98 96*  --  96*  CO2  15*   < > 25 21*  --  27 24  --  24  GLUCOSE 155*   < > 146* 168*  --  228* 265*  --  159*  BUN 101*   < > 109* 81*  --  50* 33*  --  25*  CREATININE 6.09*   < > 6.41* 5.29*  --  4.09* 3.17*  --  2.81*  CALCIUM 7.5*   < > 7.3* 7.5*  --  7.6* 8.0*  --  8.0*  PHOS 5.0*  --   --   --  4.7*  --   --  2.8  --    < > = values in this interval not displayed.   GFR: Estimated Creatinine Clearance: 29.5 mL/min (A) (by C-G formula based on SCr of 2.81 mg/dL (H)). Liver Function Tests: Recent Labs  Lab 03/10/19 0319  ALBUMIN 1.9*   No results for input(s): LIPASE, AMYLASE in the last 168 hours. No results for input(s): AMMONIA in the last 168 hours. Coagulation Profile: No results for input(s): INR, PROTIME in the last 168 hours. Cardiac Enzymes: No results for input(s): CKTOTAL, CKMB, CKMBINDEX, TROPONINI in the last 168 hours. BNP (last 3 results) No results for input(s): PROBNP in the last 8760 hours. HbA1C: No results for input(s): HGBA1C in the last 72 hours. CBG: Recent Labs  Lab 03/14/19 1209 03/14/19 1326 03/14/19 1549 03/14/19 2031 03/15/19 0742  GLUCAP 173* 152* 171* 196* 120*   Lipid Profile: No results for input(s): CHOL, HDL, LDLCALC, TRIG, CHOLHDL, LDLDIRECT in the last 72 hours. Thyroid Function Tests: No results for input(s): TSH, T4TOTAL, FREET4, T3FREE, THYROIDAB in the last 72 hours. Anemia Panel: No results for input(s): VITAMINB12, FOLATE, FERRITIN, TIBC, IRON, RETICCTPCT in the last 72 hours. Sepsis Labs: No results for input(s): PROCALCITON, LATICACIDVEN in the last 168 hours.  Recent Results (from the past 240 hour(s))  CULTURE, BLOOD (ROUTINE X 2) w Reflex to ID Panel     Status: None   Collection Time: 03/05/19 10:16 AM   Specimen: BLOOD  Result Value Ref Range Status   Specimen Description BLOOD LEFT ANTECUBITAL  Final   Special Requests   Final    BOTTLES DRAWN AEROBIC AND ANAEROBIC Blood Culture adequate volume   Culture   Final    NO GROWTH 5  DAYS Performed at [facility], [address] [place], [place] [address]    Report Status 03/10/2019 FINAL  Final  CULTURE, BLOOD (ROUTINE X 2) w Reflex to ID Panel     Status: None   Collection Time: 03/05/19 10:22 AM   Specimen: BLOOD  Result Value Ref Range Status   Specimen Description BLOOD BLOOD LEFT HAND  Final   Special Requests   Final    BOTTLES DRAWN AEROBIC ONLY Blood Culture adequate volume   Culture   Final    NO GROWTH 5 DAYS Performed at  [facility], [address] [place], [place] [address]    Report Status 03/10/2019 FINAL  Final         Radiology Studies: DG Chest 1 View  Result Date: 03/14/2019 CLINICAL DATA:  Wheezing EXAM: CHEST  1 VIEW COMPARISON:  03/03/2019 FINDINGS: Borderline heart size.  CABG. Diffuse interstitial opacity with Kerley lines and left more than right pleural effusion. No pneumothorax. IMPRESSION: CHF. Electronically Signed   By: Monte Fantasia M.D.   On: 03/14/2019 05:47   CT CHEST WO CONTRAST  Result Date: 03/14/2019 CLINICAL DATA:  Respiratory failure. EXAM: CT CHEST WITHOUT CONTRAST TECHNIQUE: Multidetector CT imaging of the chest was performed following the standard protocol without IV contrast. COMPARISON:  Chest CT dated 01/22/2019. FINDINGS: Cardiovascular: Heart size appears stable. No pericardial effusion. Diffuse coronary artery calcifications. Surgical changes of median sternotomy for presumed CABG. Mediastinum/Nodes: No mass or enlarged lymph nodes seen within the mediastinum. Esophagus appears normal. Trachea is unremarkable. Lungs/Pleura: New RIGHT pleural effusion, moderate to large in size. Associated compressive atelectasis within the RIGHT perihilar and lower lobe. New ill-defined consolidation within the LEFT lower lobe, suspected pneumonia. Probable additional rounded atelectasis within the lingula. Diffuse interstitial thickening indicating some degree of volume overload versus atypical infection.  Upper Abdomen: Limited images of the upper abdomen are unremarkable. Musculoskeletal: No acute or suspicious osseous finding. IMPRESSION: 1. New ill-defined consolidation within the LEFT lower lobe, suspected pneumonia. 2. New moderate to large-sized RIGHT pleural effusion, with associated compressive atelectasis. 3. Diffuse interstitial thickening, most likely edema/fluid overload, less likely additional atypical infection. 4. Probable additional rounded atelectasis within the lingula. Aortic Atherosclerosis (ICD10-I70.0). Electronically Signed   By: Franki Cabot M.D.   On: 03/14/2019 19:47   PERIPHERAL VASCULAR CATHETERIZATION  Result Date: 03/14/2019 See op note       Scheduled Meds: . sodium chloride  250 mL Intravenous Once  . amiodarone  200 mg Oral BID   Followed by  . [START ON 03/19/2019] amiodarone  200 mg Oral Daily  . apixaban  5 mg Oral BID  . aspirin EC  81 mg Oral Daily  . chlorhexidine  15 mL Mouth Rinse BID  . Chlorhexidine Gluconate Cloth  6 each Topical Q0600  . dextromethorphan-guaiFENesin  1 tablet Oral BID  . dronabinol  5 mg Oral BID AC  . DULoxetine  30 mg Oral Daily  . ferrous sulfate  325 mg Oral BID WC  . insulin aspart  0-5 Units Subcutaneous QHS  . insulin aspart  0-9 Units Subcutaneous TID WC  . insulin detemir  20 Units Subcutaneous QHS  . ipratropium-albuterol  3 mL Nebulization TID  . mouth rinse  15 mL Mouth Rinse q12n4p  . metoprolol tartrate  12.5 mg Oral BID  . multivitamin  1 tablet Oral QHS  . multivitamin with minerals  1 tablet Oral Daily  . nicotine  21 mg Transdermal Daily  . oxymetazoline  1 spray Each Nare BID  . polyethylene glycol  17 g Oral Daily  . Ensure Max Protein  11 oz Oral BID BM  . rosuvastatin  10 mg Oral Daily  . sodium bicarbonate  1,300 mg Oral BID  . sodium chloride flush  10-40 mL Intracatheter Q12H   Continuous Infusions: . piperacillin-tazobactam (ZOSYN)  IV Stopped (03/15/19 0207)  . small volume/piggyback  builder Stopped (03/14/19 1805)     LOS: 12 days    Time spent: 24 minutes    Ezekiel Slocumb, DO [facility]  If 7PM-7AM, please contact night-coverage [email or web address] 03/15/2019, 7:57 AM

## 2019-03-15 NOTE — Care Management Important Message (Signed)
Important Message  Patient Details  Name: Craig Koch MRN: [id number] Date of Birth: 10/16/1955   Medicare Important Message Given:  Yes     [name] 03/15/2019, 12:57 PM

## 2019-03-15 NOTE — Progress Notes (Signed)
   Did not round on patient, as he was off the floor for most of today. We will will resuming rounding tomorrow 03/16/19.  Signed, Arvil Chaco, PA-C 03/15/2019, 5:22 PM

## 2019-03-15 NOTE — Progress Notes (Signed)
Pharmacy Antibiotic Note  Craig Koch is a 64 y.o. male admitted on 03/03/2019 with pneumonia.  Pharmacy has been consulted for Zosyn dosing. Chest X-ray from 3/11 with left lower lobe consolidation consistent with pneumonia. Pt has ARF but has gone to HD 3/8-3/12.  HD perm cath placed 3/11. Patient went for thoracentesis 3/12, pleural cultures pending. Afebrile no leukocytosis.   Plan: Zosyn 3.375 gm IV Q12H EI ordered to start to start on 3/11 @ 2200.  Discontinue Vancomycin due to negative MRSA PCR.  Height: 6' (182.9 cm) Weight: (unable to weigh pt ) IBW/kg (Calculated) : 77.6  Temp (24hrs), Avg:98.1 F (36.7 C), Min:97.7 F (36.5 C), Max:98.6 F (37 C)  Recent Labs  Lab 03/11/19 0643 03/12/19 0432 03/13/19 0417 03/13/19 1149 03/14/19 0520 03/15/19 0554  WBC 7.1 5.5  --  6.3 6.9 7.0  CREATININE 6.41* 5.29* 4.09*  --  3.17* 2.81*    Estimated Creatinine Clearance: 29.5 mL/min (A) (by C-G formula based on SCr of 2.81 mg/dL (H)).    Allergies  Allergen Reactions  . Lipitor [Atorvastatin Calcium] Other (See Comments)    Aches.  Tolerated crestor.     Antimicrobials this admission:   3/11 Vancomycin 2g x1    Zosyn 3/11>>  Dose adjustments this admission: none  Microbiology results: 3/2 BCx: NG x 5 days 2/28, 3/12 MRSA PCR: Negative 3/12 Pleural cx: Pending  Thank you for allowing pharmacy to be a part of this patient's care.  Artis Delay 03/15/2019 2:41 PM

## 2019-03-16 LAB — BASIC METABOLIC PANEL
Anion gap: 8 (ref 5–15)
BUN: 19 mg/dL (ref 8–23)
CO2: 29 mmol/L (ref 22–32)
Calcium: 7.7 mg/dL — ABNORMAL LOW (ref 8.9–10.3)
Chloride: 98 mmol/L (ref 98–111)
Creatinine, Ser: 2.49 mg/dL — ABNORMAL HIGH (ref 0.61–1.24)
GFR calc Af Amer: 31 mL/min — ABNORMAL LOW (ref >60)
GFR calc non Af Amer: 26 mL/min — ABNORMAL LOW (ref >60)
Glucose, Bld: 251 mg/dL — ABNORMAL HIGH (ref 70–99)
Potassium: 4.2 mmol/L (ref 3.5–5.1)
Sodium: 135 mmol/L (ref 135–145)

## 2019-03-16 LAB — CBC
HCT: 25.4 % — ABNORMAL LOW (ref 39.0–52.0)
Hemoglobin: 8.2 g/dL — ABNORMAL LOW (ref 13.0–17.0)
MCH: 28.2 pg (ref 26.0–34.0)
MCHC: 32.3 g/dL (ref 30.0–36.0)
MCV: 87.3 fL (ref 80.0–100.0)
Platelets: 223 10*3/uL (ref 150–400)
RBC: 2.91 MIL/uL — ABNORMAL LOW (ref 4.22–5.81)
RDW: 18.8 % — ABNORMAL HIGH (ref 11.5–15.5)
WBC: 5.3 10*3/uL (ref 4.0–10.5)
nRBC: 0 % (ref 0.0–0.2)

## 2019-03-16 LAB — GLUCOSE, CAPILLARY
Glucose-Capillary: 173 mg/dL — ABNORMAL HIGH (ref 70–99)
Glucose-Capillary: 173 mg/dL — ABNORMAL HIGH (ref 70–99)
Glucose-Capillary: 196 mg/dL — ABNORMAL HIGH (ref 70–99)
Glucose-Capillary: 191 mg/dL — ABNORMAL HIGH (ref 70–99)

## 2019-03-16 LAB — PROTEIN, TOTAL: Total Protein: 5.7 g/dL — ABNORMAL LOW (ref 6.5–8.1)

## 2019-03-16 LAB — LACTATE DEHYDROGENASE: LDH: 208 U/L — ABNORMAL HIGH (ref 98–192)

## 2019-03-16 MED ORDER — THIAMINE HCL 100 MG PO TABS
100.0000 mg | ORAL_TABLET | Freq: Every day | ORAL | Status: DC
  Administered 2019-03-17 – 2019-03-19 (×3): 100 mg via ORAL
  Filled 2019-03-16 (×3): qty 1

## 2019-03-16 MED ORDER — FOLIC ACID 1 MG PO TABS
1.0000 mg | ORAL_TABLET | Freq: Every day | ORAL | Status: DC
  Administered 2019-03-17 – 2019-03-19 (×3): 1 mg via ORAL
  Filled 2019-03-16 (×3): qty 1

## 2019-03-16 MED ORDER — METOPROLOL SUCCINATE ER 25 MG PO TB24
12.5000 mg | ORAL_TABLET | Freq: Every day | ORAL | Status: DC
  Administered 2019-03-16 – 2019-03-19 (×4): 12.5 mg via ORAL
  Filled 2019-03-16 (×4): qty 1

## 2019-03-16 NOTE — Progress Notes (Addendum)
Progress Note  Patient Name: Craig Koch Date of Encounter: 03/16/2019  Primary Cardiologist: Dr. Rockey Situ  Subjective   No acute events overnight.  Denies chest pain.  Left-sided chest tube in place.  Inpatient Medications    Scheduled Meds: . sodium chloride  250 mL Intravenous Once  . amiodarone  200 mg Oral BID   Followed by  . [START ON 03/19/2019] amiodarone  200 mg Oral Daily  . apixaban  5 mg Oral BID  . aspirin EC  81 mg Oral Daily  . chlorhexidine  15 mL Mouth Rinse BID  . Chlorhexidine Gluconate Cloth  6 each Topical Q0600  . dextromethorphan-guaiFENesin  1 tablet Oral BID  . dronabinol  5 mg Oral BID AC  . DULoxetine  30 mg Oral Daily  . ferrous sulfate  325 mg Oral BID WC  . insulin aspart  0-5 Units Subcutaneous QHS  . insulin aspart  0-9 Units Subcutaneous TID WC  . insulin detemir  20 Units Subcutaneous QHS  . ipratropium-albuterol  3 mL Nebulization TID  . mouth rinse  15 mL Mouth Rinse q12n4p  . metoprolol succinate  12.5 mg Oral Daily  . multivitamin  1 tablet Oral QHS  . multivitamin with minerals  1 tablet Oral Daily  . nicotine  21 mg Transdermal Daily  . oxymetazoline  1 spray Each Nare BID  . polyethylene glycol  17 g Oral Daily  . Ensure Max Protein  11 oz Oral BID BM  . rosuvastatin  10 mg Oral Daily  . sodium bicarbonate  1,300 mg Oral BID  . sodium chloride flush  10-40 mL Intracatheter Q12H   Continuous Infusions: . piperacillin-tazobactam (ZOSYN)  IV 3.375 g (03/16/19 1112)  . small volume/piggyback builder 50 mL/hr at 03/16/19 1110   PRN Meds: acetaminophen, albuterol, dextrose, heparin lock flush, heparin lock flush, HYDROmorphone (DILAUDID) injection, menthol-cetylpyridinium, ondansetron (ZOFRAN) IV, ondansetron (ZOFRAN) IV, pneumococcal 23 valent vaccine, sodium chloride, sodium chloride flush   Vital Signs    Vitals:   03/15/19 1957 03/16/19 0424 03/16/19 0732 03/16/19 0739  BP: (!) 96/45 104/61  108/62  Pulse: 70 65  69    Resp: 20 20  18   Temp: 98.4 F (36.9 C) 97.9 F (36.6 C)  97.8 F (36.6 C)  TempSrc: Oral Oral  Oral  SpO2: 98% 100% 93% 100%  Weight:  87.9 kg    Height:        Intake/Output Summary (Last 24 hours) at 03/16/2019 1135 Last data filed at 03/16/2019 0950 Gross per 24 hour  Intake 206.67 ml  Output 550 ml  Net -343.33 ml   Last 3 Weights 03/16/2019 03/15/2019 03/15/2019  Weight (lbs) 193 lb 11.2 oz (No Data) (No Data)  Weight (kg) 87.862 kg (No Data) (No Data)      Telemetry    Sinus rhythm, heart rate 75- Personally Reviewed  ECG    New tracing obtained- Personally Reviewed  Physical Exam   GEN:  No acute distress Neck: No JVD Cardiac: RRR, no murmurs, rubs, or gallops.  Respiratory:  Decreased breath sounds at bases. GI: Soft, nontender, non-distended  MS: No edema; No deformity. Neuro:  Nonfocal  Psych: Somnolent, not able to arouse patient  Labs    High Sensitivity Troponin:   Recent Labs  Lab 03/03/19 1547 03/03/19 1750 03/14/19 1917 03/14/19 2058  TROPONINIHS 13,356* >27,000* 398* 390*      Chemistry Recent Labs  Lab 03/10/19 0319 03/11/19 0254 03/14/19 0520 03/15/19 0554 03/16/19  6314 03/16/19 0927  NA 130*   < > 135 135 135  --   K 4.0   < > 4.5 3.8 4.2  --   CL 96*   < > 96* 96* 98  --   CO2 15*   < > 24 24 29   --   GLUCOSE 155*   < > 265* 159* 251*  --   BUN 101*   < > 33* 25* 19  --   CREATININE 6.09*   < > 3.17* 2.81* 2.49*  --   CALCIUM 7.5*   < > 8.0* 8.0* 7.7*  --   PROT  --   --   --   --   --  5.7*  ALBUMIN 1.9*  --   --   --   --   --   GFRNONAA 9*   < > 20* 23* 26*  --   GFRAA 10*   < > 23* 27* 31*  --   ANIONGAP 19*   < > 15 15 8   --    < > = values in this interval not displayed.     Hematology Recent Labs  Lab 03/14/19 0520 03/15/19 0554 03/16/19 0432  WBC 6.9 7.0 5.3  RBC 3.46* 3.38* 2.91*  HGB 9.7* 9.3* 8.2*  HCT 29.0* 29.6* 25.4*  MCV 83.8 87.6 87.3  MCH 28.0 27.5 28.2  MCHC 33.4 31.4 32.3  RDW 18.6* 18.9*  18.8*  PLT 302 238 223    BNP No results for input(s): BNP, PROBNP in the last 168 hours.   DDimer No results for input(s): DDIMER in the last 168 hours.   Radiology    CT CHEST WO CONTRAST  Result Date: 03/14/2019 CLINICAL DATA:  Respiratory failure. EXAM: CT CHEST WITHOUT CONTRAST TECHNIQUE: Multidetector CT imaging of the chest was performed following the standard protocol without IV contrast. COMPARISON:  Chest CT dated 01/22/2019. FINDINGS: Cardiovascular: Heart size appears stable. No pericardial effusion. Diffuse coronary artery calcifications. Surgical changes of median sternotomy for presumed CABG. Mediastinum/Nodes: No mass or enlarged lymph nodes seen within the mediastinum. Esophagus appears normal. Trachea is unremarkable. Lungs/Pleura: New RIGHT pleural effusion, moderate to large in size. Associated compressive atelectasis within the RIGHT perihilar and lower lobe. New ill-defined consolidation within the LEFT lower lobe, suspected pneumonia. Probable additional rounded atelectasis within the lingula. Diffuse interstitial thickening indicating some degree of volume overload versus atypical infection. Upper Abdomen: Limited images of the upper abdomen are unremarkable. Musculoskeletal: No acute or suspicious osseous finding. IMPRESSION: 1. New ill-defined consolidation within the LEFT lower lobe, suspected pneumonia. 2. New moderate to large-sized RIGHT pleural effusion, with associated compressive atelectasis. 3. Diffuse interstitial thickening, most likely edema/fluid overload, less likely additional atypical infection. 4. Probable additional rounded atelectasis within the lingula. Aortic Atherosclerosis (ICD10-I70.0). Electronically Signed   By: Franki Cabot M.D.   On: 03/14/2019 19:47   PERIPHERAL VASCULAR CATHETERIZATION  Result Date: 03/14/2019 See op note  DG Chest Port 1 View  Result Date: 03/15/2019 CLINICAL DATA:  Status post thoracentesis. EXAM: PORTABLE CHEST 1 VIEW  COMPARISON:  03/14/2019 FINDINGS: Interval decrease right pleural effusion. No evidence for pneumothorax. Bibasilar atelectasis/infiltrate again noted with persistent small left pleural effusion. The cardiopericardial silhouette is within normal limits for size. Pulmonary vascular congestion has decreased in the interval. Right IJ central line tip overlies the mid to distal SVC. The visualized bony structures of the thorax are intact. Telemetry leads overlie the chest. IMPRESSION: Interval decrease in right pleural effusion  without evidence of pneumothorax. Decrease in pulmonary vascular congestion with persistent bibasilar atelectasis/infiltrate and small left pleural effusion. Electronically Signed   By: Misty Stanley M.D.   On: 03/15/2019 11:36   ECHOCARDIOGRAM COMPLETE  Result Date: 03/15/2019    ECHOCARDIOGRAM REPORT   Patient Name:   Craig Koch Date of Exam: 03/15/2019 Medical Rec #:  [id number]       Height:       72.0 in Accession #:    [id number]      Weight:       201.4 lb Date of Birth:  [birth year]       BSA:          2.137 m Patient Age:    36 years        BP:           125/69 mmHg Patient Gender: M               HR:           83 bpm. Exam Location:  [facility] Procedure: 2D Echo, Cardiac Doppler and Color Doppler Indications:     Respiratory distress  History:         Patient has prior history of Echocardiogram examinations, most                  recent 03/04/2019. Prior CABG; Risk Factors:Hypertension and                  Diabetes.  Sonographer:     Sherrie Sport RDCS (AE) Referring Phys:  [id number] Claiborne Billings A GRIFFITH Diagnosing Phys: Kathlyn Sacramento MD IMPRESSIONS  1. Left ventricular ejection fraction, by estimation, is 35 to 40%. The left ventricle has moderately decreased function. The left ventricle demonstrates regional wall motion abnormalities . The left ventricular internal cavity size was mildly dilated. There is mild left ventricular hypertrophy. Left ventricular diastolic parameters are consistent  with Grade II diastolic dysfunction (pseudonormalization). There is moderate hypokinesis of the left ventricular, entire inferior wall.  2. Right ventricular systolic function is normal. The right ventricular size is normal. There is moderately elevated pulmonary artery systolic pressure. The estimated right ventricular systolic pressure is 47.4 mmHg.  3. Left atrial size was mildly dilated.  4. The mitral valve is normal in structure. Moderate mitral valve regurgitation. No evidence of mitral stenosis.  5. The aortic valve is abnormal. Aortic valve regurgitation is mild. Mild to moderate aortic valve sclerosis/calcification is present, without any evidence of aortic stenosis.  6. The inferior vena cava is normal in size with greater than 50% respiratory variability, suggesting right atrial pressure of 3 mmHg. FINDINGS  Left Ventricle: Left ventricular ejection fraction, by estimation, is 35 to 40%. The left ventricle has moderately decreased function. The left ventricle demonstrates regional wall motion abnormalities. Moderate hypokinesis of the left ventricular, entire inferior wall. The left ventricular internal cavity size was mildly dilated. There is mild left ventricular hypertrophy. Left ventricular diastolic parameters are consistent with Grade II diastolic dysfunction (pseudonormalization). Right Ventricle: The right ventricular size is normal. No increase in right ventricular wall thickness. Right ventricular systolic function is normal. There is moderately elevated pulmonary artery systolic pressure. The tricuspid regurgitant velocity is 3.37 m/s, and with an assumed right atrial pressure of 3 mmHg, the estimated right ventricular systolic pressure is 25.9 mmHg. Left Atrium: Left atrial size was mildly dilated. Right Atrium: Right atrial size was normal in size. Pericardium: There is no evidence of pericardial effusion. Mitral Valve:  The mitral valve is normal in structure. Normal mobility of the mitral  valve leaflets. Moderate mitral valve regurgitation. No evidence of mitral valve stenosis. Tricuspid Valve: The tricuspid valve is normal in structure. Tricuspid valve regurgitation is mild . No evidence of tricuspid stenosis. Aortic Valve: The aortic valve is abnormal. Aortic valve regurgitation is mild. Mild to moderate aortic valve sclerosis/calcification is present, without any evidence of aortic stenosis. Aortic valve mean gradient measures 4.5 mmHg. Aortic valve peak gradient measures 8.4 mmHg. Aortic valve area, by VTI measures 2.54 cm. Pulmonic Valve: The pulmonic valve was normal in structure. Pulmonic valve regurgitation is not visualized. No evidence of pulmonic stenosis. Aorta: The aortic root is normal in size and structure. Venous: The inferior vena cava is normal in size with greater than 50% respiratory variability, suggesting right atrial pressure of 3 mmHg. IAS/Shunts: No atrial level shunt detected by color flow Doppler.  LEFT VENTRICLE PLAX 2D LVIDd:         5.34 cm      Diastology LVIDs:         4.38 cm      LV e' lateral:   5.77 cm/s LV PW:         1.00 cm      LV E/e' lateral: 22.4 LV IVS:        1.20 cm      LV e' medial:    5.00 cm/s LVOT diam:     2.00 cm      LV E/e' medial:  25.8 LV SV:         63 LV SV Index:   30 LVOT Area:     3.14 cm  LV Volumes (MOD) LV vol d, MOD A2C: 227.0 ml LV vol d, MOD A4C: 148.0 ml LV vol s, MOD A2C: 142.0 ml LV vol s, MOD A4C: 110.0 ml LV SV MOD A2C:     85.0 ml LV SV MOD A4C:     148.0 ml LV SV MOD BP:      59.1 ml RIGHT VENTRICLE RV Basal diam:  4.14 cm RV S prime:     10.30 cm/s TAPSE (M-mode): 3.2 cm LEFT ATRIUM             Index       RIGHT ATRIUM           Index LA diam:        4.80 cm 2.25 cm/m  RA Area:     15.70 cm LA Vol (A2C):   68.8 ml 32.20 ml/m RA Volume:   40.00 ml  18.72 ml/m LA Vol (A4C):   77.9 ml 36.45 ml/m LA Biplane Vol: 75.1 ml 35.14 ml/m  AORTIC VALVE                   PULMONIC VALVE AV Area (Vmax):    2.41 cm    PV Vmax:         0.99 m/s AV Area (Vmean):   2.15 cm    PV Peak grad:   3.9 mmHg AV Area (VTI):     2.54 cm    RVOT Peak grad: 4 mmHg AV Vmax:           144.50 cm/s AV Vmean:          94.950 cm/s AV VTI:            0.249 m AV Peak Grad:      8.4 mmHg AV Mean Grad:  4.5 mmHg LVOT Vmax:         111.00 cm/s LVOT Vmean:        65.100 cm/s LVOT VTI:          0.201 m LVOT/AV VTI ratio: 0.81  AORTA Ao Root diam: 2.90 cm MITRAL VALVE                TRICUSPID VALVE MV Area (PHT): 5.13 cm     TR Peak grad:   45.4 mmHg MV Decel Time: 148 msec     TR Vmax:        337.00 cm/s MV E velocity: 129.00 cm/s MV A velocity: 68.40 cm/s   SHUNTS MV E/A ratio:  1.89         Systemic VTI:  0.20 m                             Systemic Diam: 2.00 cm Kathlyn Sacramento MD Electronically signed by Kathlyn Sacramento MD Signature Date/Time: 03/15/2019/11:27:06 AM    Final    US THORACENTESIS ASP PLEURAL SPACE W/IMG GUIDE  Result Date: 03/15/2019 INDICATION: History of lung cancer with recurrent right pleural effusion. Request for diagnostic and therapeutic thoracentesis. EXAM: ULTRASOUND GUIDED RIGHT THORACENTESIS MEDICATIONS: 1% lidocaine 10 mL COMPLICATIONS: None immediate. PROCEDURE: An ultrasound guided thoracentesis was thoroughly discussed with the patient and questions answered. The benefits, risks, alternatives and complications were also discussed. The patient understands and wishes to proceed with the procedure. Written consent was obtained. Ultrasound was performed to localize and mark an adequate pocket of fluid in the right chest. The area was then prepped and draped in the normal sterile fashion. 1% Lidocaine was used for local anesthesia. Under ultrasound guidance a 6 Fr Safe-T-Centesis catheter was introduced. Thoracentesis was performed. The catheter was removed and a dressing applied. FINDINGS: A total of approximately 2.7 L of clear yellow fluid was removed. Samples were sent to the laboratory as requested by the clinical team. IMPRESSION:  Successful ultrasound guided right thoracentesis yielding 2.7 L of pleural fluid. No pneumothorax on post-procedure chest x-ray. Read by: Gareth Eagle, PA-C Electronically Signed   By: Sandi Mariscal M.D.   On: 03/15/2019 12:44    Cardiac Studies   Echo 1. Left ventricular ejection fraction, by estimation, is 30 to 35%. The  left ventricle has moderately decreased function. The left ventricle  demonstrates global hypokinesis. Left ventricular diastolic parameters are  indeterminate.  2. Right ventricular systolic function is normal. The right ventricular  size is normal. Tricuspid regurgitation signal is inadequate for assessing  PA pressure.  3. Left atrial size was mild to moderately dilated.  4. The mitral valve is normal in structure and function. Moderate mitral  valve regurgitation. No evidence of mitral stenosis.  5. The aortic valve is normal in structure and function. Aortic valve  regurgitation is trivial. Mild to moderate aortic valve  sclerosis/calcification is present, without any evidence of aortic  stenosis.  6. The inferior vena cava is normal in size with greater than 50%  respiratory variability, suggesting right atrial pressure of 3 mmHg.   Patient Profile     64 y.o. male with history of CAD/CABG x4, diabetes, hypertension, stage IV lung cancer, malignant pleural effusions presenting with shortness of breath, found to have atrial fibrillation with rapid ventricular response and non-ST elevated MI.  Assessment & Plan    1.  Paroxysmal A. fib with RVR -Currently in sinus rhythm -Continue p.o.  amiodarone 200 twice daily able to tolerate p.o.  -Eliquis  2.  NSTEMI, history of CAD/CABG -Not a candidate for ischemic work-up given his comorbidities and current condition -Status post Heparin infusion x48 hours total -Continue PTA aspirin, Crestor, beta-blocker  3.  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 -Kidney  injury prevents addition of ACE/ARB at this point  4.  Renal dysfunction -Hemodialysis as per nephrology.     Signed, Kate Sable, MD  03/16/2019, 11:35 AM

## 2019-03-16 NOTE — Progress Notes (Signed)
[facility]  ROUNDING NOTE   Subjective:   UOP 1160mL  Hemodialysis treatment yesterday. RIJ tunneled catheter placed yesterday  Objective:  Vital signs in last 24 hours:  Temp:  [97.6 F (36.4 C)-98.4 F (36.9 C)] 98.4 F (36.9 C) (03/13 1220) Pulse Rate:  [65-76] 76 (03/13 1220) Resp:  [18-21] 18 (03/13 1220) BP: (96-110)/(45-62) 106/46 (03/13 1220) SpO2:  [93 %-100 %] 96 % (03/13 1220) Weight:  [87.9 kg] 87.9 kg (03/13 0424)  Weight change: -7.638 kg Filed Weights   03/15/19 0422 03/16/19 0424  Weight: 91.4 kg 87.9 kg    Intake/Output: I/O last 3 completed shifts: In: 206.7 [P.O.:120; I.V.:10; IV Piggyback:76.7] Out: 1800 [Urine:1800]   Intake/Output this shift:  Total I/O In: 290 [P.O.:240; IV Piggyback:50] Out: 300 [Urine:300]  Physical Exam: General: NAD,   Head: Normocephalic, atraumatic. Moist oral mucosal membranes  Eyes: Anicteric, PERRL  Neck: Supple, trachea midline  Lungs:  Clear to auscultation  Heart: Regular rate and rhythm  Abdomen:  Soft, nontender,   Extremities:  no peripheral edema.  Neurologic: Nonfocal, moving all four extremities  Skin: No lesions  Access: RIJ permcath    Basic Metabolic Panel: Recent Labs  Lab 03/10/19 0319 03/11/19 3419 03/12/19 0432 03/12/19 0432 03/12/19 1012 03/13/19 0417 03/13/19 0417 03/14/19 0520 03/14/19 1133 03/15/19 0554 03/16/19 0432  NA 130*   < > 135  --   --  137  --  135  --  135 135  K 4.0   < > 3.9  --   --  3.5  --  4.5  --  3.8 4.2  CL 96*   < > 95*  --   --  98  --  96*  --  96* 98  CO2 15*   < > 21*  --   --  27  --  24  --  24 29  GLUCOSE 155*   < > 168*  --   --  228*  --  265*  --  159* 251*  BUN 101*   < > 81*  --   --  50*  --  33*  --  25* 19  CREATININE 6.09*   < > 5.29*  --   --  4.09*  --  3.17*  --  2.81* 2.49*  CALCIUM 7.5*   < > 7.5*   < >  --  7.6*   < > 8.0*  --  8.0* 7.7*  PHOS 5.0*  --   --   --  4.7*  --   --   --  2.8  --   --    < > = values in this  interval not displayed.    Liver Function Tests: Recent Labs  Lab 03/10/19 0319 03/16/19 0927  PROT  --  5.7*  ALBUMIN 1.9*  --    No results for input(s): LIPASE, AMYLASE in the last 168 hours. No results for input(s): AMMONIA in the last 168 hours.  CBC: Recent Labs  Lab 03/12/19 0432 03/13/19 1149 03/14/19 0520 03/15/19 0554 03/16/19 0432  WBC 5.5 6.3 6.9 7.0 5.3  HGB 7.7* 8.2* 9.7* 9.3* 8.2*  HCT 22.9* 25.0* 29.0* 29.6* 25.4*  MCV 80.1 84.5 83.8 87.6 87.3  PLT 244 277 302 238 223    Cardiac Enzymes: No results for input(s): CKTOTAL, CKMB, CKMBINDEX, TROPONINI in the last 168 hours.  BNP: Invalid input(s): POCBNP  CBG: Recent Labs  Lab 03/15/19 0742 03/15/19 1626 03/15/19 2121 03/16/19  0740 03/16/19 1221  GLUCAP 120* 98 212* 173* 173*    Microbiology: Results for orders placed or performed during the hospital encounter of 03/03/19  Respiratory Panel by RT PCR (Flu A&B, Covid) - Nasopharyngeal Swab     Status: None   Collection Time: 03/03/19  3:31 PM   Specimen: Nasopharyngeal Swab  Result Value Ref Range Status   SARS Coronavirus 2 by RT PCR NEGATIVE NEGATIVE Final    Comment: (NOTE) SARS-CoV-2 target nucleic acids are NOT DETECTED. The SARS-CoV-2 RNA is generally detectable in upper respiratoy specimens during the acute phase of infection. The lowest concentration of SARS-CoV-2 viral copies this assay can detect is 131 copies/mL. A negative result does not preclude SARS-Cov-2 infection and should not be used as the sole basis for treatment or other patient management decisions. A negative result may occur with  improper specimen collection/handling, submission of specimen other than nasopharyngeal swab, presence of viral mutation(s) within the areas targeted by this assay, and inadequate number of viral copies (<131 copies/mL). A negative result must be combined with clinical observations, patient history, and epidemiological information.  The expected result is Negative. Fact Sheet for Patients:  PinkCheek.be Fact Sheet for Healthcare Providers:  GravelBags.it This test is not yet ap proved or cleared by the [place] FDA and  has been authorized for detection and/or diagnosis of SARS-CoV-2 by FDA under an Emergency Use Authorization (EUA). This EUA will remain  in effect (meaning this test can be used) for the duration of the COVID-19 declaration under Section 564(b)(1) of the Act, 21 U.S.C. section 360bbb-3(b)(1), unless the authorization is terminated or revoked sooner.    Influenza A by PCR NEGATIVE NEGATIVE Final   Influenza B by PCR NEGATIVE NEGATIVE Final    Comment: (NOTE) The Xpert Xpress SARS-CoV-2/FLU/RSV assay is intended as an aid in  the diagnosis of influenza from Nasopharyngeal swab specimens and  should not be used as a sole basis for treatment. Nasal washings and  aspirates are unacceptable for Xpert Xpress SARS-CoV-2/FLU/RSV  testing. Fact Sheet for Patients: PinkCheek.be Fact Sheet for Healthcare Providers: GravelBags.it This test is not yet approved or cleared by the [place] FDA and  has been authorized for detection and/or diagnosis of SARS-CoV-2 by  FDA under an Emergency Use Authorization (EUA). This EUA will remain  in effect (meaning this test can be used) for the duration of the  Covid-19 declaration under Section 564(b)(1) of the Act, 21  U.S.C. section 360bbb-3(b)(1), unless the authorization is  terminated or revoked. Performed at [facility], [address] [place], [place] [address]   MRSA PCR Screening     Status: None   Collection Time: 03/03/19  7:48 PM   Specimen: Nasopharyngeal  Result Value Ref Range Status   MRSA by PCR NEGATIVE NEGATIVE Final    Comment:        The GeneXpert MRSA Assay (FDA approved for NASAL specimens only), is one  component of a comprehensive MRSA colonization surveillance program. It is not intended to diagnose MRSA infection nor to guide or monitor treatment for MRSA infections. Performed at [facility], [address] [place], [place] [address]   CULTURE, BLOOD (ROUTINE X 2) w Reflex to ID Panel     Status: None   Collection Time: 03/05/19 10:16 AM   Specimen: BLOOD  Result Value Ref Range Status   Specimen Description BLOOD LEFT ANTECUBITAL  Final   Special Requests   Final    BOTTLES DRAWN AEROBIC  AND ANAEROBIC Blood Culture adequate volume   Culture   Final    NO GROWTH 5 DAYS Performed at [facility], [address] [place], [place] [address]    Report Status 03/10/2019 FINAL  Final  CULTURE, BLOOD (ROUTINE X 2) w Reflex to ID Panel     Status: None   Collection Time: 03/05/19 10:22 AM   Specimen: BLOOD  Result Value Ref Range Status   Specimen Description BLOOD BLOOD LEFT HAND  Final   Special Requests   Final    BOTTLES DRAWN AEROBIC ONLY Blood Culture adequate volume   Culture   Final    NO GROWTH 5 DAYS Performed at [facility], [address] [place], [place] [address]    Report Status 03/10/2019 FINAL  Final  MRSA PCR Screening     Status: None   Collection Time: 03/15/19  9:08 AM   Specimen: Nasopharyngeal  Result Value Ref Range Status   MRSA by PCR NEGATIVE NEGATIVE Final    Comment:        The GeneXpert MRSA Assay (FDA approved for NASAL specimens only), is one component of a comprehensive MRSA colonization surveillance program. It is not intended to diagnose MRSA infection nor to guide or monitor treatment for MRSA infections. Performed at [facility], [address] [place], [place] [address]   Body fluid culture     Status: None (Preliminary result)   Collection Time: 03/15/19 11:00 AM   Specimen: PATH Cytology Pleural fluid  Result Value Ref Range Status   Specimen Description   Final     PLEURAL Performed at [facility], [address], [place], [place] [address]    Special Requests   Final    NONE Performed at [facility], [place], [place] [address]    Gram Stain NO WBC SEEN NO ORGANISMS SEEN   Final   Culture   Final    NO GROWTH < 24 HOURS Performed at [facility], [place] [address], [place], [place] [address]    Report Status PENDING  Incomplete    Coagulation Studies: No results for input(s): LABPROT, INR in the last 72 hours.  Urinalysis: No results for input(s): COLORURINE, LABSPEC, PHURINE, GLUCOSEU, HGBUR, BILIRUBINUR, KETONESUR, PROTEINUR, UROBILINOGEN, NITRITE, LEUKOCYTESUR in the last 72 hours.  Invalid input(s): APPERANCEUR    Imaging: CT CHEST WO CONTRAST  Result Date: 03/14/2019 CLINICAL DATA:  Respiratory failure. EXAM: CT CHEST WITHOUT CONTRAST TECHNIQUE: Multidetector CT imaging of the chest was performed following the standard protocol without IV contrast. COMPARISON:  Chest CT dated 01/22/2019. FINDINGS: Cardiovascular: Heart size appears stable. No pericardial effusion. Diffuse coronary artery calcifications. Surgical changes of median sternotomy for presumed CABG. Mediastinum/Nodes: No mass or enlarged lymph nodes seen within the mediastinum. Esophagus appears normal. Trachea is unremarkable. Lungs/Pleura: New RIGHT pleural effusion, moderate to large in size. Associated compressive atelectasis within the RIGHT perihilar and lower lobe. New ill-defined consolidation within the LEFT lower lobe, suspected pneumonia. Probable additional rounded atelectasis within the lingula. Diffuse interstitial thickening indicating some degree of volume overload versus atypical infection. Upper Abdomen: Limited images of the upper abdomen are unremarkable. Musculoskeletal: No acute or suspicious osseous finding. IMPRESSION: 1. New ill-defined consolidation within the LEFT lower lobe, suspected pneumonia. 2. New moderate to  large-sized RIGHT pleural effusion, with associated compressive atelectasis. 3. Diffuse interstitial thickening, most likely edema/fluid overload, less likely additional atypical infection. 4. Probable additional rounded atelectasis within the lingula. Aortic Atherosclerosis (  ICD10-I70.0). Electronically Signed   By: Franki Cabot M.D.   On: 03/14/2019 19:47   DG Chest Port 1 View  Result Date: 03/15/2019 CLINICAL DATA:  Status post thoracentesis. EXAM: PORTABLE CHEST 1 VIEW COMPARISON:  03/14/2019 FINDINGS: Interval decrease right pleural effusion. No evidence for pneumothorax. Bibasilar atelectasis/infiltrate again noted with persistent small left pleural effusion. The cardiopericardial silhouette is within normal limits for size. Pulmonary vascular congestion has decreased in the interval. Right IJ central line tip overlies the mid to distal SVC. The visualized bony structures of the thorax are intact. Telemetry leads overlie the chest. IMPRESSION: Interval decrease in right pleural effusion without evidence of pneumothorax. Decrease in pulmonary vascular congestion with persistent bibasilar atelectasis/infiltrate and small left pleural effusion. Electronically Signed   By: Misty Stanley M.D.   On: 03/15/2019 11:36   ECHOCARDIOGRAM COMPLETE  Result Date: 03/15/2019    ECHOCARDIOGRAM REPORT   Patient Name:   RUTHVIK BARNABY Date of Exam: 03/15/2019 Medical Rec #:  [id number]       Height:       72.0 in Accession #:    [id number]      Weight:       201.4 lb Date of Birth:  [birth year]       BSA:          2.137 m Patient Age:    89 years        BP:           125/69 mmHg Patient Gender: M               HR:           83 bpm. Exam Location:  [facility] Procedure: 2D Echo, Cardiac Doppler and Color Doppler Indications:     Respiratory distress  History:         Patient has prior history of Echocardiogram examinations, most                  recent 03/04/2019. Prior CABG; Risk Factors:Hypertension and                   Diabetes.  Sonographer:     Sherrie Sport RDCS (AE) Referring Phys:  [id number] Claiborne Billings A GRIFFITH Diagnosing Phys: Kathlyn Sacramento MD IMPRESSIONS  1. Left ventricular ejection fraction, by estimation, is 35 to 40%. The left ventricle has moderately decreased function. The left ventricle demonstrates regional wall motion abnormalities . The left ventricular internal cavity size was mildly dilated. There is mild left ventricular hypertrophy. Left ventricular diastolic parameters are consistent with Grade II diastolic dysfunction (pseudonormalization). There is moderate hypokinesis of the left ventricular, entire inferior wall.  2. Right ventricular systolic function is normal. The right ventricular size is normal. There is moderately elevated pulmonary artery systolic pressure. The estimated right ventricular systolic pressure is 22.2 mmHg.  3. Left atrial size was mildly dilated.  4. The mitral valve is normal in structure. Moderate mitral valve regurgitation. No evidence of mitral stenosis.  5. The aortic valve is abnormal. Aortic valve regurgitation is mild. Mild to moderate aortic valve sclerosis/calcification is present, without any evidence of aortic stenosis.  6. The inferior vena cava is normal in size with greater than 50% respiratory variability, suggesting right atrial pressure of 3 mmHg. FINDINGS  Left Ventricle: Left ventricular ejection fraction, by estimation, is 35 to 40%. The left ventricle has moderately decreased function. The left ventricle demonstrates regional wall motion abnormalities. Moderate hypokinesis of the left ventricular, entire inferior wall.  The left ventricular internal cavity size was mildly dilated. There is mild left ventricular hypertrophy. Left ventricular diastolic parameters are consistent with Grade II diastolic dysfunction (pseudonormalization). Right Ventricle: The right ventricular size is normal. No increase in right ventricular wall thickness. Right ventricular systolic function  is normal. There is moderately elevated pulmonary artery systolic pressure. The tricuspid regurgitant velocity is 3.37 m/s, and with an assumed right atrial pressure of 3 mmHg, the estimated right ventricular systolic pressure is 50.2 mmHg. Left Atrium: Left atrial size was mildly dilated. Right Atrium: Right atrial size was normal in size. Pericardium: There is no evidence of pericardial effusion. Mitral Valve: The mitral valve is normal in structure. Normal mobility of the mitral valve leaflets. Moderate mitral valve regurgitation. No evidence of mitral valve stenosis. Tricuspid Valve: The tricuspid valve is normal in structure. Tricuspid valve regurgitation is mild . No evidence of tricuspid stenosis. Aortic Valve: The aortic valve is abnormal. Aortic valve regurgitation is mild. Mild to moderate aortic valve sclerosis/calcification is present, without any evidence of aortic stenosis. Aortic valve mean gradient measures 4.5 mmHg. Aortic valve peak gradient measures 8.4 mmHg. Aortic valve area, by VTI measures 2.54 cm. Pulmonic Valve: The pulmonic valve was normal in structure. Pulmonic valve regurgitation is not visualized. No evidence of pulmonic stenosis. Aorta: The aortic root is normal in size and structure. Venous: The inferior vena cava is normal in size with greater than 50% respiratory variability, suggesting right atrial pressure of 3 mmHg. IAS/Shunts: No atrial level shunt detected by color flow Doppler.  LEFT VENTRICLE PLAX 2D LVIDd:         5.34 cm      Diastology LVIDs:         4.38 cm      LV e' lateral:   5.77 cm/s LV PW:         1.00 cm      LV E/e' lateral: 22.4 LV IVS:        1.20 cm      LV e' medial:    5.00 cm/s LVOT diam:     2.00 cm      LV E/e' medial:  25.8 LV SV:         63 LV SV Index:   30 LVOT Area:     3.14 cm  LV Volumes (MOD) LV vol d, MOD A2C: 227.0 ml LV vol d, MOD A4C: 148.0 ml LV vol s, MOD A2C: 142.0 ml LV vol s, MOD A4C: 110.0 ml LV SV MOD A2C:     85.0 ml LV SV MOD A4C:      148.0 ml LV SV MOD BP:      59.1 ml RIGHT VENTRICLE RV Basal diam:  4.14 cm RV S prime:     10.30 cm/s TAPSE (M-mode): 3.2 cm LEFT ATRIUM             Index       RIGHT ATRIUM           Index LA diam:        4.80 cm 2.25 cm/m  RA Area:     15.70 cm LA Vol (A2C):   68.8 ml 32.20 ml/m RA Volume:   40.00 ml  18.72 ml/m LA Vol (A4C):   77.9 ml 36.45 ml/m LA Biplane Vol: 75.1 ml 35.14 ml/m  AORTIC VALVE                   PULMONIC VALVE AV Area (Vmax):  2.41 cm    PV Vmax:        0.99 m/s AV Area (Vmean):   2.15 cm    PV Peak grad:   3.9 mmHg AV Area (VTI):     2.54 cm    RVOT Peak grad: 4 mmHg AV Vmax:           144.50 cm/s AV Vmean:          94.950 cm/s AV VTI:            0.249 m AV Peak Grad:      8.4 mmHg AV Mean Grad:      4.5 mmHg LVOT Vmax:         111.00 cm/s LVOT Vmean:        65.100 cm/s LVOT VTI:          0.201 m LVOT/AV VTI ratio: 0.81  AORTA Ao Root diam: 2.90 cm MITRAL VALVE                TRICUSPID VALVE MV Area (PHT): 5.13 cm     TR Peak grad:   45.4 mmHg MV Decel Time: 148 msec     TR Vmax:        337.00 cm/s MV E velocity: 129.00 cm/s MV A velocity: 68.40 cm/s   SHUNTS MV E/A ratio:  1.89         Systemic VTI:  0.20 m                             Systemic Diam: 2.00 cm Kathlyn Sacramento MD Electronically signed by Kathlyn Sacramento MD Signature Date/Time: 03/15/2019/11:27:06 AM    Final    US THORACENTESIS ASP PLEURAL SPACE W/IMG GUIDE  Result Date: 03/15/2019 INDICATION: History of lung cancer with recurrent right pleural effusion. Request for diagnostic and therapeutic thoracentesis. EXAM: ULTRASOUND GUIDED RIGHT THORACENTESIS MEDICATIONS: 1% lidocaine 10 mL COMPLICATIONS: None immediate. PROCEDURE: An ultrasound guided thoracentesis was thoroughly discussed with the patient and questions answered. The benefits, risks, alternatives and complications were also discussed. The patient understands and wishes to proceed with the procedure. Written consent was obtained. Ultrasound was performed to  localize and mark an adequate pocket of fluid in the right chest. The area was then prepped and draped in the normal sterile fashion. 1% Lidocaine was used for local anesthesia. Under ultrasound guidance a 6 Fr Safe-T-Centesis catheter was introduced. Thoracentesis was performed. The catheter was removed and a dressing applied. FINDINGS: A total of approximately 2.7 L of clear yellow fluid was removed. Samples were sent to the laboratory as requested by the clinical team. IMPRESSION: Successful ultrasound guided right thoracentesis yielding 2.7 L of pleural fluid. No pneumothorax on post-procedure chest x-ray. Read by: Gareth Eagle, PA-C Electronically Signed   By: Sandi Mariscal M.D.   On: 03/15/2019 12:44     Medications:   . piperacillin-tazobactam (ZOSYN)  IV 3.375 g (03/16/19 1112)   . sodium chloride  250 mL Intravenous Once  . amiodarone  200 mg Oral BID   Followed by  . [START ON 03/19/2019] amiodarone  200 mg Oral Daily  . apixaban  5 mg Oral BID  . aspirin EC  81 mg Oral Daily  . chlorhexidine  15 mL Mouth Rinse BID  . Chlorhexidine Gluconate Cloth  6 each Topical Q0600  . dextromethorphan-guaiFENesin  1 tablet Oral BID  . dronabinol  5 mg Oral BID AC  . DULoxetine  30  mg Oral Daily  . ferrous sulfate  325 mg Oral BID WC  . [START ON 7/65/4650] folic acid  1 mg Oral Daily  . insulin aspart  0-5 Units Subcutaneous QHS  . insulin aspart  0-9 Units Subcutaneous TID WC  . insulin detemir  20 Units Subcutaneous QHS  . ipratropium-albuterol  3 mL Nebulization TID  . mouth rinse  15 mL Mouth Rinse q12n4p  . metoprolol succinate  12.5 mg Oral Daily  . multivitamin  1 tablet Oral QHS  . multivitamin with minerals  1 tablet Oral Daily  . nicotine  21 mg Transdermal Daily  . oxymetazoline  1 spray Each Nare BID  . polyethylene glycol  17 g Oral Daily  . Ensure Max Protein  11 oz Oral BID BM  . rosuvastatin  10 mg Oral Daily  . sodium bicarbonate  1,300 mg Oral BID  . sodium chloride  flush  10-40 mL Intracatheter Q12H  . [START ON 03/17/2019] thiamine  100 mg Oral Daily   acetaminophen, albuterol, dextrose, heparin lock flush, heparin lock flush, HYDROmorphone (DILAUDID) injection, menthol-cetylpyridinium, ondansetron (ZOFRAN) IV, ondansetron (ZOFRAN) IV, pneumococcal 23 valent vaccine, sodium chloride, sodium chloride flush  Assessment/ Plan:  [name] is a 64 y.o. white male with hypertension, hyperlipidemia, diabetes, depression, anxiety, adenocarcinoma of the lung stage IV, coronary disease, history of CABG, tobacco and h/o alcohol abuse, left pleural effusion, admitted on 03/03/2019 for DKA (diabetic ketoacidoses) ([place]) [E11.10] New onset atrial fibrillation ([place]) [I48.91] Atrial fibrillation with rapid ventricular response ([place]) [I48.91] Diabetic ketoacidosis without coma associated with other specified diabetes mellitus ([place]) [E13.10]   1. Acute renal failure with metabolic acidosis: requiring renal replacement therapy. Last dialysis was yesterday 3/12 Baseline creatinine of 0.7, GFR > 60 on 02/08/19.  No recent urinalysis to look for underlying diabetic nephropathy.  Nonoliguric urine output. No acute indication for dialysis today.   2. Hyponatremia: secondary to renal failure  3. Diabetes mellitus type II : hemoglobin A1c of 11.3% on 03/04/19.   4. Anemia with renal failure: hemoglobin 8.2, normocytic.      LOS: 13 Anthonymichael Munday 3/13/20212:32 PM

## 2019-03-16 NOTE — Progress Notes (Signed)
PROGRESS NOTE    Craig Koch  QBH:[id number] DOB: 1955-10-30 DOA: 03/03/2019  PCP: Tonia Ghent, MD    LOS - 13   Brief Narrative:  64 year old male with hypertension, hyperlipidemia, type 2 diabetes mellitus, anxiety depression, stage IV lung cancer, CAD with history of CABG, tobacco and alcohol use who presented with generalized weakness and shortness of breath. In the ED he was found to be in DKA, AKI with creatinine of 1.6, new onset A. fib with RVR. Required BiPAP and admission to stepdown unit.  Hospital course prolonged due to persistent encephalopathy, rapid A. fib, and ATN.  Increased work of breathing on 3/11 with CXR showing acute pulmonary edema, given IV Lasix and dialyzed without much improvement.  ABG normal, never dropped oxygen sats.  No fevers or leukocytosis.  Dry chest CT showed a new mod/large right pleural effusion and new LLL infiltrate.  Started on Vanc/Zosyn.  Repeat echo pending.  US guided thoracentesis done on 3/12 with 2.7L fluid removed.  Subjective 3/13: Patient seen and examined with wife at bedside this morning.  Patient reports his breathing is much better after thoracentesis yesterday with 2.7 L total fluid removed from the right side.  Denies fevers or chills, cough or congestion, chest pain or shortness of breath, nausea vomiting or diarrhea or other acute complaints.  No acute events reported.  Assessment & Plan:   Principal Problem:   Atrial fibrillation with RVR (HCC) Active Problems:   HLD (hyperlipidemia)   Essential hypertension   History of alcohol use   CAD (coronary artery disease), native coronary artery   lung adenocarcinoma   Tobacco abuse   Uncontrolled type 2 diabetes mellitus with hyperglycemia (HCC)   DKA (diabetic ketoacidoses) (HCC)   Hypothermia   SOB (shortness of breath)   Acute renal failure (HCC)   NSTEMI (non-ST elevated myocardial infarction) (HCC)   Acute alteration in mental status   Palliative care encounter   Protein-calorie malnutrition, severe   Acute systolic heart failure (HCC)   Pain of right upper extremity   Microcytic anemia   AKI (acute kidney injury) ([place])   Acute Respiratory Failure with Hypoxiadue to Acute Decompensation of HFrEF/volume overload Left Lower Lobe Pneumonia - new 03/14/19, possibly HCAP Moderate to Large Right Pleural Effusion - seen on CT chest 3/11, likely cause of acute respiratory distress that started early AM of 3/11.  CXR with pulmonary edema, placed on bipap. Given 120 mg IV Lasix per nephrology. Echo of 03/04/19 showed EF 30-35% with moderate MR.No wheezing on exam to suggest bronchospasm. Chest x-ray personally reviewed and no appreciable infiltrates seen, appears consistent with pulmonary edema.  ABG normal.  Noncontrast CT chest shows new consolidation in LLL and moderate to large right-sided pleural effusion with compressive atelectasis.  Repeated echo and this was unchanged from previous. --continue Zosyn --Supplemental oxygen to maintain O2 sat greater than 90% --BiPAP as needed during sleep --limit volume as much as possible  Right Pleural Effusion - likely the cause of patient's respiratory distress, in addition to LLL infiltrate. Patient's prior malignant effusions were left-sided (lung cancer also on left), loculated, required chest tube at home for some time.  Underwent ultrasound-guided thoracentesis on 3/12 with 2.7 L fluid removed. None of lights criteria were met, consistent with transudate of effusion. --Monitor for recurrence --Monitor respiratory status  AKI secondary toAcuteTubularNecrosis Baseline Cr 0.7. Secondary to hypovolemia. Now in intermittent hemodialysis. Right femoraltemporary dialysiscatheter placed (started on 3/8, 3rd session today 3/10). Poor UO past 24 hours (  400cc), likely will need longer term dialysis while AKI recovers. Evaluation thus far: normalPTH, MPO/PR-3 antibody, GBM, complements. --nephrology  following --TOC following for outpatient dialysis set up --f/u SPEP, UPEP --dialysis per nephro  New onset atrial fibrillation with RVR- paroxysmal Has been in sinus for past few days. --Cardiology following --amiodarone200 mg PO BIDx 7doses, then 200 mgdaily starting 3/16 --transitioned off heparin gtt to Elquis 3/11 --continue low dose metoprolol  Stage IV lung adenocarcinoma Dr. Tasia Catchings following him in the hospital. Recommend to hold dabrafenib and trametinib. --Palliative care consulted.   --Patient being [facility] to [facility] for palliative care to follow after discharge.  Acute metabolic encephalopathy- resolved Possibly related to alcohol use. Antibiotic discontinued.  Acute systolic CHF (EF 82-42%) NSTEMI Presented with markedly elevated troponin and suspect NSTEMI with respiratory failure and DKA. --Cardiology following --off heparin gtt --continue beta blocker, statin --Per cardiology, consider Plavix on discharge and discontinue aspirin --further ischemic evaluation pending resolution of acute renal failure --monitor volume status closely: strict I/O's, daily weights  Uncontrolled type II diabetes mellitus with DKA DKA resolved on insulin drip. Patient became hypoglycemic on 3/5 possibly due to increased dose of Lantus and worsened renal function. Off insulin and being monitored on sliding scale only. CBG stable.  Alcohol abuse No withdrawal symptoms.  Tobacco abuse Nicotine patch.  Iron deficiency anemia Noted for drop in H&H. PRBC transfusion planned for 3/10 with HD.  Hypothermia Secondary to DKA. Resolved   DVT prophylaxis: On Eliquis   Code Status: DNR  Family Communication: Wife at bedside during encounter, updated and questions answered  Disposition Plan: DC to SNF pending further improvement and clearance by consultants. Coming From home Exp DC Date 3/15 Barriers as above Medically Stable for Discharge?   No  Consultants:   Nephrology  Cardiology  Vascular surgery  Palliative Care  Oncology  Procedures:   Dialysis  PermCath placed on 03/14/19  Right thoracentesis on 03/15/19 with 2.7 L pleural fluid removed  Antimicrobials:   Zosyn    Objective: Vitals:   03/15/19 1957 03/16/19 0424 03/16/19 0732 03/16/19 0739  BP: (!) 96/45 104/61  108/62  Pulse: 70 65  69  Resp: _0 Temp: 98.4 F (36.9 C) 97.9 F (36.6 C)  97.8 F (36.6 C)  TempSrc: Oral Oral  Oral  SpO2: 98% 100% 93% 100%  Weight:  87.9 kg    Height:        Intake/Output Summary (Last 24 hours) at 03/16/2019 0823 Last data filed at 03/16/2019 3536 Gross per 24 hour  Intake 206.67 ml  Output 1150 ml  Net -943.33 ml   Filed Weights   03/15/19 0422 03/16/19 0424  Weight: 91.4 kg 87.9 kg    Examination:  General exam: awake, alert, no acute distress, chronically ill-appearing HEENT: moist mucus membranes, hearing grossly normal  Respiratory system: Decreased breath sounds right greater than left, no wheezes, rales or rhonchi, normal respiratory effort. Cardiovascular system: normal S1/S2, RRR, no pedal edema.   Gastrointestinal system: soft, NT, ND, no HSM felt, +bowel sounds. Central nervous system: A&O x4. no gross focal neurologic deficits, normal speech Extremities: moves all, no cyanosis, normal tone Psychiatry: normal mood, congruent affect, judgement and insight appear normal    Data Reviewed: I have personally reviewed following labs and imaging studies  CBC: Recent Labs  Lab 03/12/19 0432 03/13/19 1149 03/14/19 0520 03/15/19 0554 03/16/19 0432  WBC 5.5 6.3 6.9 7.0 5.3  HGB 7.7* 8.2* 9.7* 9.3* 8.2*  HCT 22.9*  25.0* 29.0* 29.6* 25.4*  MCV 80.1 84.5 83.8 87.6 87.3  PLT 244 277 302 238 546   Basic Metabolic Panel: Recent Labs  Lab 03/10/19 0319 03/11/19 0643 03/12/19 0432 03/12/19 1012 03/13/19 0417 03/14/19 0520 03/14/19 1133 03/15/19 0554 03/16/19 0432  NA 130*    < > 135  --  137 135  --  135 135  K 4.0   < > 3.9  --  3.5 4.5  --  3.8 4.2  CL 96*   < > 95*  --  98 96*  --  96* 98  CO2 15*   < > 21*  --  27 24  --  24 29  GLUCOSE 155*   < > 168*  --  228* 265*  --  159* 251*  BUN 101*   < > 81*  --  50* 33*  --  25* 19  CREATININE 6.09*   < > 5.29*  --  4.09* 3.17*  --  2.81* 2.49*  CALCIUM 7.5*   < > 7.5*  --  7.6* 8.0*  --  8.0* 7.7*  PHOS 5.0*  --   --  4.7*  --   --  2.8  --   --    < > = values in this interval not displayed.   GFR: Estimated Creatinine Clearance: 33.3 mL/min (A) (by C-G formula based on SCr of 2.49 mg/dL (H)). Liver Function Tests: Recent Labs  Lab 03/10/19 0319  ALBUMIN 1.9*   No results for input(s): LIPASE, AMYLASE in the last 168 hours. No results for input(s): AMMONIA in the last 168 hours. Coagulation Profile: No results for input(s): INR, PROTIME in the last 168 hours. Cardiac Enzymes: No results for input(s): CKTOTAL, CKMB, CKMBINDEX, TROPONINI in the last 168 hours. BNP (last 3 results) No results for input(s): PROBNP in the last 8760 hours. HbA1C: No results for input(s): HGBA1C in the last 72 hours. CBG: Recent Labs  Lab 03/14/19 2031 03/15/19 0742 03/15/19 1626 03/15/19 2121 03/16/19 0740  GLUCAP 196* 120* 98 212* 173*   Lipid Profile: No results for input(s): CHOL, HDL, LDLCALC, TRIG, CHOLHDL, LDLDIRECT in the last 72 hours. Thyroid Function Tests: No results for input(s): TSH, T4TOTAL, FREET4, T3FREE, THYROIDAB in the last 72 hours. Anemia Panel: No results for input(s): VITAMINB12, FOLATE, FERRITIN, TIBC, IRON, RETICCTPCT in the last 72 hours. Sepsis Labs: No results for input(s): PROCALCITON, LATICACIDVEN in the last 168 hours.  Recent Results (from the past 240 hour(s))  MRSA PCR Screening     Status: None   Collection Time: 03/15/19  9:08 AM   Specimen: Nasopharyngeal  Result Value Ref Range Status   MRSA by PCR NEGATIVE NEGATIVE Final    Comment:        The GeneXpert MRSA Assay  (FDA approved for NASAL specimens only), is one component of a comprehensive MRSA colonization surveillance program. It is not intended to diagnose MRSA infection nor to guide or monitor treatment for MRSA infections. Performed at [facility], [address] [place], [place] [address]   Body fluid culture     Status: None (Preliminary result)   Collection Time: 03/15/19 11:00 AM   Specimen: PATH Cytology Pleural fluid  Result Value Ref Range Status   Specimen Description   Final    PLEURAL Performed at [facility], [address] [place], [place] [address]    Special Requests   Final    NONE Performed at [facility], [address] [place], [place] [address]    Gram Stain NO WBC SEEN NO ORGANISMS SEEN   Final   Culture   Final    NO GROWTH < 24 HOURS Performed at [facility], [place] [address] [place], [place] [address]    Report Status PENDING  Incomplete         Radiology Studies: CT CHEST WO CONTRAST  Result Date: 03/14/2019 CLINICAL DATA:  Respiratory failure. EXAM: CT CHEST WITHOUT CONTRAST TECHNIQUE: Multidetector CT imaging of the chest was performed following the standard protocol without IV contrast. COMPARISON:  Chest CT dated 01/22/2019. FINDINGS: Cardiovascular: Heart size appears stable. No pericardial effusion. Diffuse coronary artery calcifications. Surgical changes of median sternotomy for presumed CABG. Mediastinum/Nodes: No mass or enlarged lymph nodes seen within the mediastinum. Esophagus appears normal. Trachea is unremarkable. Lungs/Pleura: New RIGHT pleural effusion, moderate to large in size. Associated compressive atelectasis within the RIGHT perihilar and lower lobe. New ill-defined consolidation within the LEFT lower lobe, suspected pneumonia. Probable additional rounded atelectasis within the lingula. Diffuse interstitial thickening indicating some degree of volume overload versus atypical infection. Upper  Abdomen: Limited images of the upper abdomen are unremarkable. Musculoskeletal: No acute or suspicious osseous finding. IMPRESSION: 1. New ill-defined consolidation within the LEFT lower lobe, suspected pneumonia. 2. New moderate to large-sized RIGHT pleural effusion, with associated compressive atelectasis. 3. Diffuse interstitial thickening, most likely edema/fluid overload, less likely additional atypical infection. 4. Probable additional rounded atelectasis within the lingula. Aortic Atherosclerosis (ICD10-I70.0). Electronically Signed   By: Franki Cabot M.D.   On: 03/14/2019 19:47   PERIPHERAL VASCULAR CATHETERIZATION  Result Date: 03/14/2019 See op note  DG Chest Port 1 View  Result Date: 03/15/2019 CLINICAL DATA:  Status post thoracentesis. EXAM: PORTABLE CHEST 1 VIEW COMPARISON:  03/14/2019 FINDINGS: Interval decrease right pleural effusion. No evidence for pneumothorax. Bibasilar atelectasis/infiltrate again noted with persistent small left pleural effusion. The cardiopericardial silhouette is within normal limits for size. Pulmonary vascular congestion has decreased in the interval. Right IJ central line tip overlies the mid to distal SVC. The visualized bony structures of the thorax are intact. Telemetry leads overlie the chest. IMPRESSION: Interval decrease in right pleural effusion without evidence of pneumothorax. Decrease in pulmonary vascular congestion with persistent bibasilar atelectasis/infiltrate and small left pleural effusion. Electronically Signed   By: Misty Stanley M.D.   On: 03/15/2019 11:36   ECHOCARDIOGRAM COMPLETE  Result Date: 03/15/2019    ECHOCARDIOGRAM REPORT   Patient Name:   NASIF BOS Date of Exam: 03/15/2019 Medical Rec #:  [id number]       Height:       72.0 in Accession #:    [id number]      Weight:       201.4 lb Date of Birth:  [birth year]       BSA:          2.137 m Patient Age:    35 years        BP:           125/69 mmHg Patient Gender: M               HR:            83 bpm. Exam Location:  [facility] Procedure: 2D Echo, Cardiac Doppler and Color Doppler Indications:     Respiratory distress  History:         Patient has prior history of Echocardiogram examinations, most  recent 03/04/2019. Prior CABG; Risk Factors:Hypertension and                  Diabetes.  Sonographer:     Sherrie Sport RDCS (AE) Referring Phys:  [id number] Claiborne Billings A GRIFFITH Diagnosing Phys: Kathlyn Sacramento MD IMPRESSIONS  1. Left ventricular ejection fraction, by estimation, is 35 to 40%. The left ventricle has moderately decreased function. The left ventricle demonstrates regional wall motion abnormalities . The left ventricular internal cavity size was mildly dilated. There is mild left ventricular hypertrophy. Left ventricular diastolic parameters are consistent with Grade II diastolic dysfunction (pseudonormalization). There is moderate hypokinesis of the left ventricular, entire inferior wall.  2. Right ventricular systolic function is normal. The right ventricular size is normal. There is moderately elevated pulmonary artery systolic pressure. The estimated right ventricular systolic pressure is 47.0 mmHg.  3. Left atrial size was mildly dilated.  4. The mitral valve is normal in structure. Moderate mitral valve regurgitation. No evidence of mitral stenosis.  5. The aortic valve is abnormal. Aortic valve regurgitation is mild. Mild to moderate aortic valve sclerosis/calcification is present, without any evidence of aortic stenosis.  6. The inferior vena cava is normal in size with greater than 50% respiratory variability, suggesting right atrial pressure of 3 mmHg. FINDINGS  Left Ventricle: Left ventricular ejection fraction, by estimation, is 35 to 40%. The left ventricle has moderately decreased function. The left ventricle demonstrates regional wall motion abnormalities. Moderate hypokinesis of the left ventricular, entire inferior wall. The left ventricular internal cavity size was mildly  dilated. There is mild left ventricular hypertrophy. Left ventricular diastolic parameters are consistent with Grade II diastolic dysfunction (pseudonormalization). Right Ventricle: The right ventricular size is normal. No increase in right ventricular wall thickness. Right ventricular systolic function is normal. There is moderately elevated pulmonary artery systolic pressure. The tricuspid regurgitant velocity is 3.37 m/s, and with an assumed right atrial pressure of 3 mmHg, the estimated right ventricular systolic pressure is 96.2 mmHg. Left Atrium: Left atrial size was mildly dilated. Right Atrium: Right atrial size was normal in size. Pericardium: There is no evidence of pericardial effusion. Mitral Valve: The mitral valve is normal in structure. Normal mobility of the mitral valve leaflets. Moderate mitral valve regurgitation. No evidence of mitral valve stenosis. Tricuspid Valve: The tricuspid valve is normal in structure. Tricuspid valve regurgitation is mild . No evidence of tricuspid stenosis. Aortic Valve: The aortic valve is abnormal. Aortic valve regurgitation is mild. Mild to moderate aortic valve sclerosis/calcification is present, without any evidence of aortic stenosis. Aortic valve mean gradient measures 4.5 mmHg. Aortic valve peak gradient measures 8.4 mmHg. Aortic valve area, by VTI measures 2.54 cm. Pulmonic Valve: The pulmonic valve was normal in structure. Pulmonic valve regurgitation is not visualized. No evidence of pulmonic stenosis. Aorta: The aortic root is normal in size and structure. Venous: The inferior vena cava is normal in size with greater than 50% respiratory variability, suggesting right atrial pressure of 3 mmHg. IAS/Shunts: No atrial level shunt detected by color flow Doppler.  LEFT VENTRICLE PLAX 2D LVIDd:         5.34 cm      Diastology LVIDs:         4.38 cm      LV e' lateral:   5.77 cm/s LV PW:         1.00 cm      LV E/e' lateral: 22.4 LV IVS:        1.20 cm  LV e'  medial:    5.00 cm/s LVOT diam:     2.00 cm      LV E/e' medial:  25.8 LV SV:         63 LV SV Index:   30 LVOT Area:     3.14 cm  LV Volumes (MOD) LV vol d, MOD A2C: 227.0 ml LV vol d, MOD A4C: 148.0 ml LV vol s, MOD A2C: 142.0 ml LV vol s, MOD A4C: 110.0 ml LV SV MOD A2C:     85.0 ml LV SV MOD A4C:     148.0 ml LV SV MOD BP:      59.1 ml RIGHT VENTRICLE RV Basal diam:  4.14 cm RV S prime:     10.30 cm/s TAPSE (M-mode): 3.2 cm LEFT ATRIUM             Index       RIGHT ATRIUM           Index LA diam:        4.80 cm 2.25 cm/m  RA Area:     15.70 cm LA Vol (A2C):   68.8 ml 32.20 ml/m RA Volume:   40.00 ml  18.72 ml/m LA Vol (A4C):   77.9 ml 36.45 ml/m LA Biplane Vol: 75.1 ml 35.14 ml/m  AORTIC VALVE                   PULMONIC VALVE AV Area (Vmax):    2.41 cm    PV Vmax:        0.99 m/s AV Area (Vmean):   2.15 cm    PV Peak grad:   3.9 mmHg AV Area (VTI):     2.54 cm    RVOT Peak grad: 4 mmHg AV Vmax:           144.50 cm/s AV Vmean:          94.950 cm/s AV VTI:            0.249 m AV Peak Grad:      8.4 mmHg AV Mean Grad:      4.5 mmHg LVOT Vmax:         111.00 cm/s LVOT Vmean:        65.100 cm/s LVOT VTI:          0.201 m LVOT/AV VTI ratio: 0.81  AORTA Ao Root diam: 2.90 cm MITRAL VALVE                TRICUSPID VALVE MV Area (PHT): 5.13 cm     TR Peak grad:   45.4 mmHg MV Decel Time: 148 msec     TR Vmax:        337.00 cm/s MV E velocity: 129.00 cm/s MV A velocity: 68.40 cm/s   SHUNTS MV E/A ratio:  1.89         Systemic VTI:  0.20 m                             Systemic Diam: 2.00 cm Kathlyn Sacramento MD Electronically signed by Kathlyn Sacramento MD Signature Date/Time: 03/15/2019/11:27:06 AM    Final    US THORACENTESIS ASP PLEURAL SPACE W/IMG GUIDE  Result Date: 03/15/2019 INDICATION: History of lung cancer with recurrent right pleural effusion. Request for diagnostic and therapeutic thoracentesis. EXAM: ULTRASOUND GUIDED RIGHT THORACENTESIS MEDICATIONS: 1% lidocaine 10 mL COMPLICATIONS: None immediate.  PROCEDURE: An ultrasound guided thoracentesis was thoroughly  discussed with the patient and questions answered. The benefits, risks, alternatives and complications were also discussed. The patient understands and wishes to proceed with the procedure. Written consent was obtained. Ultrasound was performed to localize and mark an adequate pocket of fluid in the right chest. The area was then prepped and draped in the normal sterile fashion. 1% Lidocaine was used for local anesthesia. Under ultrasound guidance a 6 Fr Safe-T-Centesis catheter was introduced. Thoracentesis was performed. The catheter was removed and a dressing applied. FINDINGS: A total of approximately 2.7 L of clear yellow fluid was removed. Samples were sent to the laboratory as requested by the clinical team. IMPRESSION: Successful ultrasound guided right thoracentesis yielding 2.7 L of pleural fluid. No pneumothorax on post-procedure chest x-ray. Read by: Gareth Eagle, PA-C Electronically Signed   By: Sandi Mariscal M.D.   On: 03/15/2019 12:44        Scheduled Meds: . sodium chloride  250 mL Intravenous Once  . amiodarone  200 mg Oral BID   Followed by  . [START ON 03/19/2019] amiodarone  200 mg Oral Daily  . apixaban  5 mg Oral BID  . aspirin EC  81 mg Oral Daily  . chlorhexidine  15 mL Mouth Rinse BID  . Chlorhexidine Gluconate Cloth  6 each Topical Q0600  . dextromethorphan-guaiFENesin  1 tablet Oral BID  . dronabinol  5 mg Oral BID AC  . DULoxetine  30 mg Oral Daily  . ferrous sulfate  325 mg Oral BID WC  . insulin aspart  0-5 Units Subcutaneous QHS  . insulin aspart  0-9 Units Subcutaneous TID WC  . insulin detemir  20 Units Subcutaneous QHS  . ipratropium-albuterol  3 mL Nebulization TID  . mouth rinse  15 mL Mouth Rinse q12n4p  . metoprolol tartrate  12.5 mg Oral BID  . multivitamin  1 tablet Oral QHS  . multivitamin with minerals  1 tablet Oral Daily  . nicotine  21 mg Transdermal Daily  . oxymetazoline  1 spray Each  Nare BID  . polyethylene glycol  17 g Oral Daily  . Ensure Max Protein  11 oz Oral BID BM  . rosuvastatin  10 mg Oral Daily  . sodium bicarbonate  1,300 mg Oral BID  . sodium chloride flush  10-40 mL Intracatheter Q12H   Continuous Infusions: . piperacillin-tazobactam (ZOSYN)  IV Stopped (03/16/19 0131)  . small volume/piggyback builder Stopped (03/16/19 0132)     LOS: 13 days    Time spent: 30 minutes    Ezekiel Slocumb, DO [facility]   If 7PM-7AM, please contact night-coverage [email or web address] 03/16/2019, 8:23 AM

## 2019-03-17 LAB — CBC
HCT: 26.9 % — ABNORMAL LOW (ref 39.0–52.0)
Hemoglobin: 8.8 g/dL — ABNORMAL LOW (ref 13.0–17.0)
MCH: 28.7 pg (ref 26.0–34.0)
MCHC: 32.7 g/dL (ref 30.0–36.0)
MCV: 87.6 fL (ref 80.0–100.0)
Platelets: 226 10*3/uL (ref 150–400)
RBC: 3.07 MIL/uL — ABNORMAL LOW (ref 4.22–5.81)
RDW: 19 % — ABNORMAL HIGH (ref 11.5–15.5)
WBC: 6.7 10*3/uL (ref 4.0–10.5)
nRBC: 0 % (ref 0.0–0.2)

## 2019-03-17 LAB — BASIC METABOLIC PANEL
Anion gap: 10 (ref 5–15)
BUN: 28 mg/dL — ABNORMAL HIGH (ref 8–23)
CO2: 29 mmol/L (ref 22–32)
Calcium: 8.1 mg/dL — ABNORMAL LOW (ref 8.9–10.3)
Chloride: 97 mmol/L — ABNORMAL LOW (ref 98–111)
Creatinine, Ser: 3.09 mg/dL — ABNORMAL HIGH (ref 0.61–1.24)
GFR calc Af Amer: 23 mL/min — ABNORMAL LOW (ref >60)
GFR calc non Af Amer: 20 mL/min — ABNORMAL LOW (ref >60)
Glucose, Bld: 154 mg/dL — ABNORMAL HIGH (ref 70–99)
Potassium: 3.6 mmol/L (ref 3.5–5.1)
Sodium: 136 mmol/L (ref 135–145)

## 2019-03-17 LAB — URINALYSIS, ROUTINE W REFLEX MICROSCOPIC
Bilirubin Urine: NEGATIVE
Glucose, UA: 50 mg/dL — AB
Hgb urine dipstick: NEGATIVE
Ketones, ur: 5 mg/dL — AB
Leukocytes,Ua: NEGATIVE
Nitrite: NEGATIVE
Protein, ur: 100 mg/dL — AB
Specific Gravity, Urine: 1.017 (ref 1.005–1.030)
pH: 5 (ref 5.0–8.0)

## 2019-03-17 LAB — PROTEIN / CREATININE RATIO, URINE
Creatinine, Urine: 120 mg/dL
Protein Creatinine Ratio: 1.01 mg/mg{Cre} — ABNORMAL HIGH (ref 0.00–0.15)
Total Protein, Urine: 121 mg/dL

## 2019-03-17 LAB — PROTEIN, BODY FLUID (OTHER): Total Protein, Body Fluid Other: 0.9 g/dL

## 2019-03-17 LAB — GLUCOSE, CAPILLARY
Glucose-Capillary: 138 mg/dL — ABNORMAL HIGH (ref 70–99)
Glucose-Capillary: 151 mg/dL — ABNORMAL HIGH (ref 70–99)
Glucose-Capillary: 187 mg/dL — ABNORMAL HIGH (ref 70–99)
Glucose-Capillary: 213 mg/dL — ABNORMAL HIGH (ref 70–99)
Glucose-Capillary: 224 mg/dL — ABNORMAL HIGH (ref 70–99)
Glucose-Capillary: 90 mg/dL (ref 70–99)

## 2019-03-17 NOTE — Progress Notes (Signed)
PROGRESS NOTE    Craig Koch  XIH:[id number] DOB: May 05, 1955 DOA: 03/03/2019  PCP: Tonia Ghent, MD    LOS - 14   Brief Narrative:  64 year old male with hypertension, hyperlipidemia, type 2 diabetes mellitus, anxiety depression, stage IV lung cancer, CAD with history of CABG, tobacco and alcohol use who presented with generalized weakness and shortness of breath. In the ED he was found to be in DKA, AKI with creatinine of 1.6, new onset A. fib with RVR. Required BiPAP and admission to stepdown unit. Hospital course prolonged due to persistent encephalopathy, rapid A. fib, and ATN. Increased work of breathing on 3/11 with CXR showing acute pulmonary edema, given IV Lasix and dialyzed without much improvement. ABG normal, never dropped oxygen sats. No fevers or leukocytosis. Dry chest CT showed a new mod/large right pleural effusion and new LLL infiltrate. Started on Vanc/Zosyn. Repeat echo pending. US guided thoracentesis done on 3/12 with 2.7L fluid removed.  Subjective 3/14: Patient seen this morning at bedside. No acute events reported overnight. He reports feeling okay today. Says his breathing might be just slightly worse than it was immediately after his thoracentesis 2 days ago. He denies fevers chills or cough, nausea vomiting diarrhea or other acute complaints. Reports eating and drinking well.  Assessment & Plan:   Principal Problem:   Atrial fibrillation with RVR (HCC) Active Problems:   HLD (hyperlipidemia)   Essential hypertension   History of alcohol use   CAD (coronary artery disease), native coronary artery   lung adenocarcinoma   Tobacco abuse   Uncontrolled type 2 diabetes mellitus with hyperglycemia (HCC)   DKA (diabetic ketoacidoses) (HCC)   Hypothermia   SOB (shortness of breath)   Acute renal failure (HCC)   NSTEMI (non-ST elevated myocardial infarction) (HCC)   Acute alteration in mental status   Palliative care encounter   Protein-calorie  malnutrition, severe   Acute systolic heart failure (HCC)   Pain of right upper extremity   Microcytic anemia   AKI (acute kidney injury) ([place])   Acute Respiratory Failure with Hypoxiadue to Acute Decompensation of HFrEF/volume overload Left Lower Lobe Pneumonia- new 03/14/19, possibly HCAP Moderate to Large Right Pleural Effusion- seen on CT chest 3/11, likely cause of acute respiratory distress that started early AM of 3/11.  CXR with pulmonary edema, placed on bipap. Given 120 mg IV Lasix per nephrology. Echo of 03/04/19 showed EF 30-35% with moderate MR.No wheezing on exam to suggest bronchospasm. Chest x-ray personally reviewed and no appreciable infiltrates seen, appears consistent with pulmonary edema.ABG normal.Noncontrast CT chest shows new consolidation in LLL and moderate to large right-sided pleural effusion with compressive atelectasis.  Repeated echo and this was unchanged from previous. --continue Zosyn --Supplemental oxygen to maintain O2 sat greater than 90% --BiPAP as neededduring sleep --limit volume as much as possible  Right Pleural Effusion- likely the cause of patient's respiratory distress, in addition to LLL infiltrate. Patient's prior malignant effusions were left-sided (lung cancer also on left), loculated, required chest tube at home for some time.  Underwent ultrasound-guided thoracentesis on 3/12 with 2.7 L fluid removed. None of lights criteria were met, consistent with transudate of effusion. --Monitor for recurrence --Monitor respiratory status  AKI secondary toAcuteTubularNecrosis Baseline Cr 0.7. Secondary to hypovolemia. Now in intermittent hemodialysis. Right femoraltemporary dialysiscatheter placed (started on 3/8, 3rd session today 3/10). Poor UO past 24 hours (400cc), likely will need longer term dialysis while AKI recovers. Evaluation thus far: normalPTH, MPO/PR-3 antibody, GBM, complements. --nephrology following --TOC  following for outpatient dialysis set up --f/u SPEP, UPEP --dialysis per nephro  New onset atrial fibrillation with RVR- paroxysmal Has been in sinus for past few days. --Cardiology following --amiodarone200 mg PO BIDx 7doses, then 200 mgdaily starting 3/16 --transitioned off heparin gtt to Elquis 3/11 --continue low dose metoprolol  Stage IV lung adenocarcinoma Dr. Tasia Catchings following him in the hospital. Recommend to hold dabrafenib and trametinib. --Palliative care consulted.  --Patient being [facility] to [facility] for palliative care to follow after discharge.  Acute metabolic encephalopathy- resolved Possibly related to alcohol use. Antibiotic discontinued.  Acute systolic CHF (EF 59-56%) NSTEMI Presented with markedly elevated troponin and suspect NSTEMI with respiratory failure and DKA. --Cardiology following --off heparin gtt --continue beta blocker, statin --Per cardiology, consider Plavix on discharge and discontinue aspirin --further ischemic evaluation pending resolution of acute renal failure --monitor volume status closely: strict I/O's, daily weights  Uncontrolled type II diabetes mellitus with DKA DKA resolved on insulin drip. Patient became hypoglycemic on 3/5 possibly due to increased dose of Lantus and worsened renal function. Off insulin and being monitored on sliding scale only. CBG stable.  Alcohol abuse No withdrawal symptoms.  Tobacco abuse Nicotine patch.  Iron deficiency anemia Noted for drop in H&H. PRBC transfusion planned for 3/10 with HD.  Hypothermia Secondary to DKA. Resolved   DVT prophylaxis: On Eliquis   Code Status: DNR  Family Communication:  None at bedside during encounter  Disposition Plan: DC to SNF pending further improvement and clearance by consultants. Coming From home Exp DC Date 3/15 Barriers as above Medically Stable for Discharge?  No  Consultants:   Nephrology  Cardiology  Vascular  surgery  Palliative Care  Oncology  Procedures:   Dialysis  PermCath placed on 03/14/19  Right thoracentesis on 03/15/19 with 2.7 L pleural fluid removed  Antimicrobials:   Zosyn     Objective: Vitals:   03/16/19 2001 03/16/19 2008 03/17/19 0418 03/17/19 0739  BP:  (!) 110/55 113/61 120/65  Pulse:  70 72 75  Resp:  20 20   Temp:  98 F (36.7 C) 98.2 F (36.8 C) 98.2 F (36.8 C)  TempSrc:  Oral Oral Oral  SpO2: 93% 100% 100% 96%  Weight:      Height:        Intake/Output Summary (Last 24 hours) at 03/17/2019 0741 Last data filed at 03/17/2019 0300 Gross per 24 hour  Intake 290 ml  Output 550 ml  Net -260 ml   Filed Weights   03/15/19 0422 03/16/19 0424  Weight: 91.4 kg 87.9 kg    Examination:  General exam: awake, alert, no acute distress Respiratory system: Decreased breath sounds at the right base otherwise clear, no wheezes, rales or rhonchi, normal respiratory effort. Cardiovascular system: normal S1/S2, RRR, no pedal edema.   Central nervous system: A&O x4. no gross focal neurologic deficits, normal speech Extremities: moves all, no cyanosis, normal tone Skin: dry, intact, normal temperature Psychiatry: normal mood, congruent affect, judgement and insight appear normal    Data Reviewed: I have personally reviewed following labs and imaging studies  CBC: Recent Labs  Lab 03/13/19 1149 03/14/19 0520 03/15/19 0554 03/16/19 0432 03/17/19 0440  WBC 6.3 6.9 7.0 5.3 6.7  HGB 8.2* 9.7* 9.3* 8.2* 8.8*  HCT 25.0* 29.0* 29.6* 25.4* 26.9*  MCV 84.5 83.8 87.6 87.3 87.6  PLT 277 302 238 223 387   Basic Metabolic Panel: Recent Labs  Lab 03/12/19 0432 03/12/19 1012 03/13/19 0417 03/14/19 0520 03/14/19 1133 03/15/19 0554  03/16/19 0432 03/17/19 0440  NA   < >  --  137 135  --  135 135 136  K   < >  --  3.5 4.5  --  3.8 4.2 3.6  CL   < >  --  98 96*  --  96* 98 97*  CO2   < >  --  27 24  --  '24 29 29  ' GLUCOSE   < >  --  228* 265*  --  159*  251* 154*  BUN   < >  --  50* 33*  --  25* 19 28*  CREATININE   < >  --  4.09* 3.17*  --  2.81* 2.49* 3.09*  CALCIUM   < >  --  7.6* 8.0*  --  8.0* 7.7* 8.1*  PHOS  --  4.7*  --   --  2.8  --   --   --    < > = values in this interval not displayed.   GFR: Estimated Creatinine Clearance: 26.5 mL/min (A) (by C-G formula based on SCr of 3.09 mg/dL (H)). Liver Function Tests: Recent Labs  Lab 03/16/19 0927  PROT 5.7*   No results for input(s): LIPASE, AMYLASE in the last 168 hours. No results for input(s): AMMONIA in the last 168 hours. Coagulation Profile: No results for input(s): INR, PROTIME in the last 168 hours. Cardiac Enzymes: No results for input(s): CKTOTAL, CKMB, CKMBINDEX, TROPONINI in the last 168 hours. BNP (last 3 results) No results for input(s): PROBNP in the last 8760 hours. HbA1C: No results for input(s): HGBA1C in the last 72 hours. CBG: Recent Labs  Lab 03/16/19 1221 03/16/19 1654 03/16/19 2007 03/17/19 0008 03/17/19 0417  GLUCAP 173* 196* 191* 187* 138*   Lipid Profile: No results for input(s): CHOL, HDL, LDLCALC, TRIG, CHOLHDL, LDLDIRECT in the last 72 hours. Thyroid Function Tests: No results for input(s): TSH, T4TOTAL, FREET4, T3FREE, THYROIDAB in the last 72 hours. Anemia Panel: No results for input(s): VITAMINB12, FOLATE, FERRITIN, TIBC, IRON, RETICCTPCT in the last 72 hours. Sepsis Labs: No results for input(s): PROCALCITON, LATICACIDVEN in the last 168 hours.  Recent Results (from the past 240 hour(s))  MRSA PCR Screening     Status: None   Collection Time: 03/15/19  9:08 AM   Specimen: Nasopharyngeal  Result Value Ref Range Status   MRSA by PCR NEGATIVE NEGATIVE Final    Comment:        The GeneXpert MRSA Assay (FDA approved for NASAL specimens only), is one component of a comprehensive MRSA colonization surveillance program. It is not intended to diagnose MRSA infection nor to guide or monitor treatment for MRSA  infections. Performed at [facility], [address] [place], [place] [address]   Body fluid culture     Status: None (Preliminary result)   Collection Time: 03/15/19 11:00 AM   Specimen: PATH Cytology Pleural fluid  Result Value Ref Range Status   Specimen Description   Final    PLEURAL Performed at [facility], [address] [place], [place] [address]    Special Requests   Final    NONE Performed at [facility], [place], [place] [address]    Gram Stain NO WBC SEEN NO ORGANISMS SEEN   Final   Culture   Final    NO GROWTH < 24 HOURS Performed at [facility], [place] [address], [place], [place] [address]    Report Status  PENDING  Incomplete         Radiology Studies: DG Chest Port 1 View  Result Date: 03/15/2019 CLINICAL DATA:  Status post thoracentesis. EXAM: PORTABLE CHEST 1 VIEW COMPARISON:  03/14/2019 FINDINGS: Interval decrease right pleural effusion. No evidence for pneumothorax. Bibasilar atelectasis/infiltrate again noted with persistent small left pleural effusion. The cardiopericardial silhouette is within normal limits for size. Pulmonary vascular congestion has decreased in the interval. Right IJ central line tip overlies the mid to distal SVC. The visualized bony structures of the thorax are intact. Telemetry leads overlie the chest. IMPRESSION: Interval decrease in right pleural effusion without evidence of pneumothorax. Decrease in pulmonary vascular congestion with persistent bibasilar atelectasis/infiltrate and small left pleural effusion. Electronically Signed   By: Misty Stanley M.D.   On: 03/15/2019 11:36   ECHOCARDIOGRAM COMPLETE  Result Date: 03/15/2019    ECHOCARDIOGRAM REPORT   Patient Name:   Craig Koch Date of Exam: 03/15/2019 Medical Rec #:  [id number]       Height:       72.0 in Accession #:    [id number]      Weight:       201.4 lb Date of Birth:  1955/05/02       BSA:          2.137 m Patient  Age:    29 years        BP:           125/69 mmHg Patient Gender: M               HR:           83 bpm. Exam Location:  [facility] Procedure: 2D Echo, Cardiac Doppler and Color Doppler Indications:     Respiratory distress  History:         Patient has prior history of Echocardiogram examinations, most                  recent 03/04/2019. Prior CABG; Risk Factors:Hypertension and                  Diabetes.  Sonographer:     Sherrie Sport RDCS (AE) Referring Phys:  [id number] Claiborne Billings A Nick Armel Diagnosing Phys: Kathlyn Sacramento MD IMPRESSIONS  1. Left ventricular ejection fraction, by estimation, is 35 to 40%. The left ventricle has moderately decreased function. The left ventricle demonstrates regional wall motion abnormalities . The left ventricular internal cavity size was mildly dilated. There is mild left ventricular hypertrophy. Left ventricular diastolic parameters are consistent with Grade II diastolic dysfunction (pseudonormalization). There is moderate hypokinesis of the left ventricular, entire inferior wall.  2. Right ventricular systolic function is normal. The right ventricular size is normal. There is moderately elevated pulmonary artery systolic pressure. The estimated right ventricular systolic pressure is 31.5 mmHg.  3. Left atrial size was mildly dilated.  4. The mitral valve is normal in structure. Moderate mitral valve regurgitation. No evidence of mitral stenosis.  5. The aortic valve is abnormal. Aortic valve regurgitation is mild. Mild to moderate aortic valve sclerosis/calcification is present, without any evidence of aortic stenosis.  6. The inferior vena cava is normal in size with greater than 50% respiratory variability, suggesting right atrial pressure of 3 mmHg. FINDINGS  Left Ventricle: Left ventricular ejection fraction, by estimation, is 35 to 40%. The left ventricle has moderately decreased function. The left ventricle demonstrates regional wall motion abnormalities. Moderate hypokinesis of the left  ventricular, entire inferior wall. The left ventricular  internal cavity size was mildly dilated. There is mild left ventricular hypertrophy. Left ventricular diastolic parameters are consistent with Grade II diastolic dysfunction (pseudonormalization). Right Ventricle: The right ventricular size is normal. No increase in right ventricular wall thickness. Right ventricular systolic function is normal. There is moderately elevated pulmonary artery systolic pressure. The tricuspid regurgitant velocity is 3.37 m/s, and with an assumed right atrial pressure of 3 mmHg, the estimated right ventricular systolic pressure is 10.6 mmHg. Left Atrium: Left atrial size was mildly dilated. Right Atrium: Right atrial size was normal in size. Pericardium: There is no evidence of pericardial effusion. Mitral Valve: The mitral valve is normal in structure. Normal mobility of the mitral valve leaflets. Moderate mitral valve regurgitation. No evidence of mitral valve stenosis. Tricuspid Valve: The tricuspid valve is normal in structure. Tricuspid valve regurgitation is mild . No evidence of tricuspid stenosis. Aortic Valve: The aortic valve is abnormal. Aortic valve regurgitation is mild. Mild to moderate aortic valve sclerosis/calcification is present, without any evidence of aortic stenosis. Aortic valve mean gradient measures 4.5 mmHg. Aortic valve peak gradient measures 8.4 mmHg. Aortic valve area, by VTI measures 2.54 cm. Pulmonic Valve: The pulmonic valve was normal in structure. Pulmonic valve regurgitation is not visualized. No evidence of pulmonic stenosis. Aorta: The aortic root is normal in size and structure. Venous: The inferior vena cava is normal in size with greater than 50% respiratory variability, suggesting right atrial pressure of 3 mmHg. IAS/Shunts: No atrial level shunt detected by color flow Doppler.  LEFT VENTRICLE PLAX 2D LVIDd:         5.34 cm      Diastology LVIDs:         4.38 cm      LV e' lateral:   5.77  cm/s LV PW:         1.00 cm      LV E/e' lateral: 22.4 LV IVS:        1.20 cm      LV e' medial:    5.00 cm/s LVOT diam:     2.00 cm      LV E/e' medial:  25.8 LV SV:         63 LV SV Index:   30 LVOT Area:     3.14 cm  LV Volumes (MOD) LV vol d, MOD A2C: 227.0 ml LV vol d, MOD A4C: 148.0 ml LV vol s, MOD A2C: 142.0 ml LV vol s, MOD A4C: 110.0 ml LV SV MOD A2C:     85.0 ml LV SV MOD A4C:     148.0 ml LV SV MOD BP:      59.1 ml RIGHT VENTRICLE RV Basal diam:  4.14 cm RV S prime:     10.30 cm/s TAPSE (M-mode): 3.2 cm LEFT ATRIUM             Index       RIGHT ATRIUM           Index LA diam:        4.80 cm 2.25 cm/m  RA Area:     15.70 cm LA Vol (A2C):   68.8 ml 32.20 ml/m RA Volume:   40.00 ml  18.72 ml/m LA Vol (A4C):   77.9 ml 36.45 ml/m LA Biplane Vol: 75.1 ml 35.14 ml/m  AORTIC VALVE                   PULMONIC VALVE AV Area (Vmax):    2.41 cm  PV Vmax:        0.99 m/s AV Area (Vmean):   2.15 cm    PV Peak grad:   3.9 mmHg AV Area (VTI):     2.54 cm    RVOT Peak grad: 4 mmHg AV Vmax:           144.50 cm/s AV Vmean:          94.950 cm/s AV VTI:            0.249 m AV Peak Grad:      8.4 mmHg AV Mean Grad:      4.5 mmHg LVOT Vmax:         111.00 cm/s LVOT Vmean:        65.100 cm/s LVOT VTI:          0.201 m LVOT/AV VTI ratio: 0.81  AORTA Ao Root diam: 2.90 cm MITRAL VALVE                TRICUSPID VALVE MV Area (PHT): 5.13 cm     TR Peak grad:   45.4 mmHg MV Decel Time: 148 msec     TR Vmax:        337.00 cm/s MV E velocity: 129.00 cm/s MV A velocity: 68.40 cm/s   SHUNTS MV E/A ratio:  1.89         Systemic VTI:  0.20 m                             Systemic Diam: 2.00 cm Kathlyn Sacramento MD Electronically signed by Kathlyn Sacramento MD Signature Date/Time: 03/15/2019/11:27:06 AM    Final    US THORACENTESIS ASP PLEURAL SPACE W/IMG GUIDE  Result Date: 03/15/2019 INDICATION: History of lung cancer with recurrent right pleural effusion. Request for diagnostic and therapeutic thoracentesis. EXAM: ULTRASOUND GUIDED  RIGHT THORACENTESIS MEDICATIONS: 1% lidocaine 10 mL COMPLICATIONS: None immediate. PROCEDURE: An ultrasound guided thoracentesis was thoroughly discussed with the patient and questions answered. The benefits, risks, alternatives and complications were also discussed. The patient understands and wishes to proceed with the procedure. Written consent was obtained. Ultrasound was performed to localize and mark an adequate pocket of fluid in the right chest. The area was then prepped and draped in the normal sterile fashion. 1% Lidocaine was used for local anesthesia. Under ultrasound guidance a 6 Fr Safe-T-Centesis catheter was introduced. Thoracentesis was performed. The catheter was removed and a dressing applied. FINDINGS: A total of approximately 2.7 L of clear yellow fluid was removed. Samples were sent to the laboratory as requested by the clinical team. IMPRESSION: Successful ultrasound guided right thoracentesis yielding 2.7 L of pleural fluid. No pneumothorax on post-procedure chest x-ray. Read by: Gareth Eagle, PA-C Electronically Signed   By: Sandi Mariscal M.D.   On: 03/15/2019 12:44        Scheduled Meds: . sodium chloride  250 mL Intravenous Once  . amiodarone  200 mg Oral BID   Followed by  . [START ON 03/19/2019] amiodarone  200 mg Oral Daily  . apixaban  5 mg Oral BID  . aspirin EC  81 mg Oral Daily  . chlorhexidine  15 mL Mouth Rinse BID  . Chlorhexidine Gluconate Cloth  6 each Topical Q0600  . dextromethorphan-guaiFENesin  1 tablet Oral BID  . dronabinol  5 mg Oral BID AC  . DULoxetine  30 mg Oral Daily  . ferrous sulfate  325 mg Oral BID WC  .  folic acid  1 mg Oral Daily  . insulin aspart  0-5 Units Subcutaneous QHS  . insulin aspart  0-9 Units Subcutaneous TID WC  . insulin detemir  20 Units Subcutaneous QHS  . ipratropium-albuterol  3 mL Nebulization TID  . mouth rinse  15 mL Mouth Rinse q12n4p  . metoprolol succinate  12.5 mg Oral Daily  . multivitamin  1 tablet Oral QHS  .  multivitamin with minerals  1 tablet Oral Daily  . nicotine  21 mg Transdermal Daily  . oxymetazoline  1 spray Each Nare BID  . polyethylene glycol  17 g Oral Daily  . Ensure Max Protein  11 oz Oral BID BM  . rosuvastatin  10 mg Oral Daily  . sodium bicarbonate  1,300 mg Oral BID  . sodium chloride flush  10-40 mL Intracatheter Q12H  . thiamine  100 mg Oral Daily   Continuous Infusions: . piperacillin-tazobactam (ZOSYN)  IV Stopped (03/17/19 0317)     LOS: 14 days    Time spent: 25 minutes    Ezekiel Slocumb, DO [facility]   If 7PM-7AM, please contact night-coverage [email or web address] 03/17/2019, 7:41 AM

## 2019-03-17 NOTE — Progress Notes (Signed)
[facility]  ROUNDING NOTE   Subjective:   Patient states he is feeling subjectively better. Denies any shortness of breath. Continues to state he is weak.   UOP 560mL.   Objective:  Vital signs in last 24 hours:  Temp:  [98 F (36.7 C)-98.9 F (37.2 C)] 98.8 F (37.1 C) (03/14 1122) Pulse Rate:  [70-79] 79 (03/14 1122) Resp:  [18-20] 20 (03/14 0418) BP: (106-120)/(46-65) 113/52 (03/14 1122) SpO2:  [93 %-100 %] 99 % (03/14 1122)  Weight change:  Filed Weights   03/15/19 0422 03/16/19 0424  Weight: 91.4 kg 87.9 kg    Intake/Output: I/O last 3 completed shifts: In: 366.7 [P.O.:240; IV Piggyback:126.7] Out: 1100 [Urine:1100]   Intake/Output this shift:  Total I/O In: 120 [P.O.:120] Out: 200 [Urine:200]  Physical Exam: General: NAD,   Head: Normocephalic, atraumatic. Moist oral mucosal membranes  Eyes: Anicteric, PERRL  Neck: Supple, trachea midline  Lungs:  Clear to auscultation  Heart: Regular rate and rhythm  Abdomen:  Soft, nontender,   Extremities:  no peripheral edema.  Neurologic: Nonfocal, moving all four extremities  Skin: No lesions  Access: RIJ permcath    Basic Metabolic Panel: Recent Labs  Lab 03/12/19 0432 03/12/19 1012 03/13/19 0417 03/13/19 0417 03/14/19 0520 03/14/19 0520 03/14/19 1133 03/15/19 0554 03/16/19 0432 03/17/19 0440  NA   < >  --  137  --  135  --   --  135 135 136  K   < >  --  3.5  --  4.5  --   --  3.8 4.2 3.6  CL   < >  --  98  --  96*  --   --  96* 98 97*  CO2   < >  --  27  --  24  --   --  24 29 29   GLUCOSE   < >  --  228*  --  265*  --   --  159* 251* 154*  BUN   < >  --  50*  --  33*  --   --  25* 19 28*  CREATININE   < >  --  4.09*  --  3.17*  --   --  2.81* 2.49* 3.09*  CALCIUM   < >  --  7.6*   < > 8.0*   < >  --  8.0* 7.7* 8.1*  PHOS  --  4.7*  --   --   --   --  2.8  --   --   --    < > = values in this interval not displayed.    Liver Function Tests: Recent Labs  Lab 03/16/19 0927  PROT  5.7*   No results for input(s): LIPASE, AMYLASE in the last 168 hours. No results for input(s): AMMONIA in the last 168 hours.  CBC: Recent Labs  Lab 03/13/19 1149 03/14/19 0520 03/15/19 0554 03/16/19 0432 03/17/19 0440  WBC 6.3 6.9 7.0 5.3 6.7  HGB 8.2* 9.7* 9.3* 8.2* 8.8*  HCT 25.0* 29.0* 29.6* 25.4* 26.9*  MCV 84.5 83.8 87.6 87.3 87.6  PLT 277 302 238 223 226    Cardiac Enzymes: No results for input(s): CKTOTAL, CKMB, CKMBINDEX, TROPONINI in the last 168 hours.  BNP: Invalid input(s): POCBNP  CBG: Recent Labs  Lab 03/16/19 2007 03/17/19 0008 03/17/19 0417 03/17/19 0740 03/17/19 1123  GLUCAP 191* 187* 138* 90 151*    Microbiology: Results for orders placed or performed during the  hospital encounter of 03/03/19  Respiratory Panel by RT PCR (Flu A&B, Covid) - Nasopharyngeal Swab     Status: None   Collection Time: 03/03/19  3:31 PM   Specimen: Nasopharyngeal Swab  Result Value Ref Range Status   SARS Coronavirus 2 by RT PCR NEGATIVE NEGATIVE Final    Comment: (NOTE) SARS-CoV-2 target nucleic acids are NOT DETECTED. The SARS-CoV-2 RNA is generally detectable in upper respiratoy specimens during the acute phase of infection. The lowest concentration of SARS-CoV-2 viral copies this assay can detect is 131 copies/mL. A negative result does not preclude SARS-Cov-2 infection and should not be used as the sole basis for treatment or other patient management decisions. A negative result may occur with  improper specimen collection/handling, submission of specimen other than nasopharyngeal swab, presence of viral mutation(s) within the areas targeted by this assay, and inadequate number of viral copies (<131 copies/mL). A negative result must be combined with clinical observations, patient history, and epidemiological information. The expected result is Negative. Fact Sheet for Patients:  PinkCheek.be Fact Sheet for Healthcare Providers:   GravelBags.it This test is not yet ap proved or cleared by the [place] FDA and  has been authorized for detection and/or diagnosis of SARS-CoV-2 by FDA under an Emergency Use Authorization (EUA). This EUA will remain  in effect (meaning this test can be used) for the duration of the COVID-19 declaration under Section 564(b)(1) of the Act, 21 U.S.C. section 360bbb-3(b)(1), unless the authorization is terminated or revoked sooner.    Influenza A by PCR NEGATIVE NEGATIVE Final   Influenza B by PCR NEGATIVE NEGATIVE Final    Comment: (NOTE) The Xpert Xpress SARS-CoV-2/FLU/RSV assay is intended as an aid in  the diagnosis of influenza from Nasopharyngeal swab specimens and  should not be used as a sole basis for treatment. Nasal washings and  aspirates are unacceptable for Xpert Xpress SARS-CoV-2/FLU/RSV  testing. Fact Sheet for Patients: PinkCheek.be Fact Sheet for Healthcare Providers: GravelBags.it This test is not yet approved or cleared by the [place] FDA and  has been authorized for detection and/or diagnosis of SARS-CoV-2 by  FDA under an Emergency Use Authorization (EUA). This EUA will remain  in effect (meaning this test can be used) for the duration of the  Covid-19 declaration under Section 564(b)(1) of the Act, 21  U.S.C. section 360bbb-3(b)(1), unless the authorization is  terminated or revoked. Performed at [facility], [address] [place], [place] [address]   MRSA PCR Screening     Status: None   Collection Time: 03/03/19  7:48 PM   Specimen: Nasopharyngeal  Result Value Ref Range Status   MRSA by PCR NEGATIVE NEGATIVE Final    Comment:        The GeneXpert MRSA Assay (FDA approved for NASAL specimens only), is one component of a comprehensive MRSA colonization surveillance program. It is not intended to diagnose MRSA infection nor to guide or monitor  treatment for MRSA infections. Performed at [facility], [address] [place], [place] [address]   CULTURE, BLOOD (ROUTINE X 2) w Reflex to ID Panel     Status: None   Collection Time: 03/05/19 10:16 AM   Specimen: BLOOD  Result Value Ref Range Status   Specimen Description BLOOD LEFT ANTECUBITAL  Final   Special Requests   Final    BOTTLES DRAWN AEROBIC AND ANAEROBIC Blood Culture adequate volume   Culture   Final    NO GROWTH 5 DAYS Performed at [facility], [address] [place], [place] [address]    Report Status 03/10/2019 FINAL  Final  CULTURE, BLOOD (ROUTINE X 2) w Reflex to ID Panel     Status: None   Collection Time: 03/05/19 10:22 AM   Specimen: BLOOD  Result Value Ref Range Status   Specimen Description BLOOD BLOOD LEFT HAND  Final   Special Requests   Final    BOTTLES DRAWN AEROBIC ONLY Blood Culture adequate volume   Culture   Final    NO GROWTH 5 DAYS Performed at [facility], [address] [place], [place] [address]    Report Status 03/10/2019 FINAL  Final  MRSA PCR Screening     Status: None   Collection Time: 03/15/19  9:08 AM   Specimen: Nasopharyngeal  Result Value Ref Range Status   MRSA by PCR NEGATIVE NEGATIVE Final    Comment:        The GeneXpert MRSA Assay (FDA approved for NASAL specimens only), is one component of a comprehensive MRSA colonization surveillance program. It is not intended to diagnose MRSA infection nor to guide or monitor treatment for MRSA infections. Performed at [facility], [address] [place], [place] [address]   Body fluid culture     Status: None (Preliminary result)   Collection Time: 03/15/19 11:00 AM   Specimen: PATH Cytology Pleural fluid  Result Value Ref Range Status   Specimen Description   Final    PLEURAL Performed at [facility], [address] [place], [place] [address]    Special Requests   Final    NONE Performed at [facility], [address] [place], [place] [address]    Gram Stain NO WBC SEEN NO ORGANISMS SEEN   Final   Culture   Final    NO GROWTH 2 DAYS Performed at [facility], [place] [address], [place], [place] [address]    Report Status PENDING  Incomplete    Coagulation Studies: No results for input(s): LABPROT, INR in the last 72 hours.  Urinalysis: No results for input(s): COLORURINE, LABSPEC, PHURINE, GLUCOSEU, HGBUR, BILIRUBINUR, KETONESUR, PROTEINUR, UROBILINOGEN, NITRITE, LEUKOCYTESUR in the last 72 hours.  Invalid input(s): APPERANCEUR    Imaging: No results found.   Medications:   . piperacillin-tazobactam (ZOSYN)  IV 3.375 g (03/17/19 0924)   . sodium chloride  250 mL Intravenous Once  . amiodarone  200 mg Oral BID   Followed by  . [START ON 03/19/2019] amiodarone  200 mg Oral Daily  . apixaban  5 mg Oral BID  . aspirin EC  81 mg Oral Daily  . chlorhexidine  15 mL Mouth Rinse BID  . Chlorhexidine Gluconate Cloth  6 each Topical Q0600  . dextromethorphan-guaiFENesin  1 tablet Oral BID  . dronabinol  5 mg Oral BID AC  . DULoxetine  30 mg Oral Daily  . ferrous sulfate  325 mg Oral BID WC  . folic acid  1 mg Oral Daily  . insulin aspart  0-5 Units Subcutaneous QHS  . insulin aspart  0-9 Units Subcutaneous TID WC  . insulin detemir  20 Units Subcutaneous QHS  . ipratropium-albuterol  3 mL Nebulization TID  . mouth rinse  15 mL Mouth Rinse q12n4p  . metoprolol succinate  12.5 mg Oral Daily  . multivitamin  1 tablet Oral QHS  . multivitamin with minerals  1 tablet Oral Daily  . nicotine  21 mg Transdermal Daily  . oxymetazoline  1 spray Each Nare BID  . polyethylene glycol  17 g Oral Daily  . Ensure Max Protein  11 oz Oral BID BM  . rosuvastatin  10 mg Oral Daily  . sodium bicarbonate  1,300 mg Oral BID  . sodium chloride flush  10-40 mL Intracatheter Q12H  . thiamine  100 mg Oral Daily   acetaminophen, albuterol, dextrose, heparin lock flush, heparin lock  flush, HYDROmorphone (DILAUDID) injection, menthol-cetylpyridinium, ondansetron (ZOFRAN) IV, ondansetron (ZOFRAN) IV, pneumococcal 23 valent vaccine, sodium chloride, sodium chloride flush  Assessment/ Plan:  [name] is a 64 y.o. white male with hypertension, hyperlipidemia, diabetes, depression, anxiety, adenocarcinoma of the lung stage IV, coronary disease, history of CABG, tobacco and h/o alcohol abuse, left pleural effusion, admitted on 03/03/2019 for DKA (diabetic ketoacidoses) ([place]) [E11.10] New onset atrial fibrillation ([place]) [I48.91] Atrial fibrillation with rapid ventricular response ([place]) [I48.91] Diabetic ketoacidosis without coma associated with other specified diabetes mellitus ([place]) [E13.10]   1. Acute renal failure with metabolic acidosis: requiring renal replacement therapy. Last dialysis was 3/12 Baseline creatinine of 0.7, GFR > 60 on 02/08/19.  Most likely with underlying diabetic nephropathy.  Nonoliguric urine output. No acute indication for dialysis today.  - Check urine studies.  - Holding ACE-I/ARB  2. Hyponatremia: secondary to renal failure. Improved to 136 today.   3. Diabetes mellitus type II : hemoglobin A1c of 11.3% on 03/04/19. Poor control.  Holding metformin.   4. Anemia with renal failure: hemoglobin 8.8, normocytic.   5. Hypertension: with atrial fibrillation and coronary artery disease.  Appreciate cardiology input.  - metoprolol.   6. Pneumonia: status post right thoracentesis on 3/12 with 2.7 liters removed.  - pip/tazo.    LOS: 14 Dashun Borre 3/14/202111:27 AM

## 2019-03-17 NOTE — Progress Notes (Signed)
Progress Note  Patient Name: Craig Koch Date of Encounter: 03/17/2019  Primary Cardiologist: Dr. Rockey Situ  Subjective   No acute events overnight.  States doing okay, feels weak.  Inpatient Medications    Scheduled Meds: . sodium chloride  250 mL Intravenous Once  . amiodarone  200 mg Oral BID   Followed by  . [START ON 03/19/2019] amiodarone  200 mg Oral Daily  . apixaban  5 mg Oral BID  . aspirin EC  81 mg Oral Daily  . chlorhexidine  15 mL Mouth Rinse BID  . Chlorhexidine Gluconate Cloth  6 each Topical Q0600  . dextromethorphan-guaiFENesin  1 tablet Oral BID  . dronabinol  5 mg Oral BID AC  . DULoxetine  30 mg Oral Daily  . ferrous sulfate  325 mg Oral BID WC  . folic acid  1 mg Oral Daily  . insulin aspart  0-5 Units Subcutaneous QHS  . insulin aspart  0-9 Units Subcutaneous TID WC  . insulin detemir  20 Units Subcutaneous QHS  . ipratropium-albuterol  3 mL Nebulization TID  . mouth rinse  15 mL Mouth Rinse q12n4p  . metoprolol succinate  12.5 mg Oral Daily  . multivitamin  1 tablet Oral QHS  . multivitamin with minerals  1 tablet Oral Daily  . nicotine  21 mg Transdermal Daily  . oxymetazoline  1 spray Each Nare BID  . polyethylene glycol  17 g Oral Daily  . Ensure Max Protein  11 oz Oral BID BM  . rosuvastatin  10 mg Oral Daily  . sodium bicarbonate  1,300 mg Oral BID  . sodium chloride flush  10-40 mL Intracatheter Q12H  . thiamine  100 mg Oral Daily   Continuous Infusions: . piperacillin-tazobactam (ZOSYN)  IV 3.375 g (03/17/19 0924)   PRN Meds: acetaminophen, albuterol, dextrose, heparin lock flush, heparin lock flush, HYDROmorphone (DILAUDID) injection, menthol-cetylpyridinium, ondansetron (ZOFRAN) IV, ondansetron (ZOFRAN) IV, pneumococcal 23 valent vaccine, sodium chloride, sodium chloride flush   Vital Signs    Vitals:   03/16/19 2001 03/16/19 2008 03/17/19 0418 03/17/19 0739  BP:  (!) 110/55 113/61 120/65  Pulse:  70 72 75  Resp:  20 20     Temp:  98 F (36.7 C) 98.2 F (36.8 C) 98.2 F (36.8 C)  TempSrc:  Oral Oral Oral  SpO2: 93% 100% 100% 96%  Weight:      Height:        Intake/Output Summary (Last 24 hours) at 03/17/2019 1111 Last data filed at 03/17/2019 0950 Gross per 24 hour  Intake 290 ml  Output 750 ml  Net -460 ml   Last 3 Weights 03/16/2019 03/15/2019 03/15/2019  Weight (lbs) 193 lb 11.2 oz (No Data) (No Data)  Weight (kg) 87.862 kg (No Data) (No Data)      Telemetry    Sinus rhythm, heart rate 70- Personally Reviewed  ECG    New tracing obtained- Personally Reviewed  Physical Exam   GEN:  No acute distress Neck: No JVD Cardiac: RRR, no murmurs, rubs, or gallops.  Respiratory:  Decreased breath sounds at bases. GI: Soft, nontender, non-distended  MS: No edema; No deformity. Neuro:  Nonfocal  Psych: Somnolent, not able to arouse patient  Labs    High Sensitivity Troponin:   Recent Labs  Lab 03/03/19 1547 03/03/19 1750 03/14/19 1917 03/14/19 2058  TROPONINIHS 13,356* >27,000* 398* 390*      Chemistry Recent Labs  Lab 03/15/19 0554 03/16/19 0432 03/16/19 0927 03/17/19  0440  NA 135 135  --  136  K 3.8 4.2  --  3.6  CL 96* 98  --  97*  CO2 24 29  --  29  GLUCOSE 159* 251*  --  154*  BUN 25* 19  --  28*  CREATININE 2.81* 2.49*  --  3.09*  CALCIUM 8.0* 7.7*  --  8.1*  PROT  --   --  5.7*  --   GFRNONAA 23* 26*  --  20*  GFRAA 27* 31*  --  23*  ANIONGAP 15 8  --  10     Hematology Recent Labs  Lab 03/15/19 0554 03/16/19 0432 03/17/19 0440  WBC 7.0 5.3 6.7  RBC 3.38* 2.91* 3.07*  HGB 9.3* 8.2* 8.8*  HCT 29.6* 25.4* 26.9*  MCV 87.6 87.3 87.6  MCH 27.5 28.2 28.7  MCHC 31.4 32.3 32.7  RDW 18.9* 18.8* 19.0*  PLT 238 223 226    BNP No results for input(s): BNP, PROBNP in the last 168 hours.   DDimer No results for input(s): DDIMER in the last 168 hours.   Radiology    DG Chest Port 1 View  Result Date: 03/15/2019 CLINICAL DATA:  Status post thoracentesis.  EXAM: PORTABLE CHEST 1 VIEW COMPARISON:  03/14/2019 FINDINGS: Interval decrease right pleural effusion. No evidence for pneumothorax. Bibasilar atelectasis/infiltrate again noted with persistent small left pleural effusion. The cardiopericardial silhouette is within normal limits for size. Pulmonary vascular congestion has decreased in the interval. Right IJ central line tip overlies the mid to distal SVC. The visualized bony structures of the thorax are intact. Telemetry leads overlie the chest. IMPRESSION: Interval decrease in right pleural effusion without evidence of pneumothorax. Decrease in pulmonary vascular congestion with persistent bibasilar atelectasis/infiltrate and small left pleural effusion. Electronically Signed   By: Misty Stanley M.D.   On: 03/15/2019 11:36   US THORACENTESIS ASP PLEURAL SPACE W/IMG GUIDE  Result Date: 03/15/2019 INDICATION: History of lung cancer with recurrent right pleural effusion. Request for diagnostic and therapeutic thoracentesis. EXAM: ULTRASOUND GUIDED RIGHT THORACENTESIS MEDICATIONS: 1% lidocaine 10 mL COMPLICATIONS: None immediate. PROCEDURE: An ultrasound guided thoracentesis was thoroughly discussed with the patient and questions answered. The benefits, risks, alternatives and complications were also discussed. The patient understands and wishes to proceed with the procedure. Written consent was obtained. Ultrasound was performed to localize and mark an adequate pocket of fluid in the right chest. The area was then prepped and draped in the normal sterile fashion. 1% Lidocaine was used for local anesthesia. Under ultrasound guidance a 6 Fr Safe-T-Centesis catheter was introduced. Thoracentesis was performed. The catheter was removed and a dressing applied. FINDINGS: A total of approximately 2.7 L of clear yellow fluid was removed. Samples were sent to the laboratory as requested by the clinical team. IMPRESSION: Successful ultrasound guided right thoracentesis  yielding 2.7 L of pleural fluid. No pneumothorax on post-procedure chest x-ray. Read by: Gareth Eagle, PA-C Electronically Signed   By: Sandi Mariscal M.D.   On: 03/15/2019 12:44    Cardiac Studies   Echo 1. Left ventricular ejection fraction, by estimation, is 30 to 35%. The  left ventricle has moderately decreased function. The left ventricle  demonstrates global hypokinesis. Left ventricular diastolic parameters are  indeterminate.  2. Right ventricular systolic function is normal. The right ventricular  size is normal. Tricuspid regurgitation signal is inadequate for assessing  PA pressure.  3. Left atrial size was mild to moderately dilated.  4. The mitral valve is  normal in structure and function. Moderate mitral  valve regurgitation. No evidence of mitral stenosis.  5. The aortic valve is normal in structure and function. Aortic valve  regurgitation is trivial. Mild to moderate aortic valve  sclerosis/calcification is present, without any evidence of aortic  stenosis.  6. The inferior vena cava is normal in size with greater than 50%  respiratory variability, suggesting right atrial pressure of 3 mmHg.   Patient Profile     64 y.o. male with history of CAD/CABG x4, diabetes, hypertension, stage IV lung cancer, malignant pleural effusions presenting with shortness of breath, found to have atrial fibrillation with rapid ventricular response and non-ST elevated MI.  Assessment & Plan    1.  Paroxysmal A. fib with RVR -Currently in sinus rhythm -Continue p.o. amiodarone 200 twice daily  -Eliquis  2.  NSTEMI, history of CAD/CABG -Not a candidate for ischemic work-up given his comorbidities and current condition -Status post Heparin infusion x48 hours total -Continue PTA aspirin, Crestor, beta-blocker  3.  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 -Kidney injury prevents addition of ACE/ARB at this point  4.  Renal  dysfunction -Hemodialysis as per nephrology.     Signed, Kate Sable, MD  03/17/2019, 11:11 AM

## 2019-03-18 DIAGNOSIS — J90 Pleural effusion, not elsewhere classified: Secondary | ICD-10-CM

## 2019-03-18 LAB — CBC WITH DIFFERENTIAL/PLATELET
Abs Immature Granulocytes: 0.05 10*3/uL (ref 0.00–0.07)
Basophils Absolute: 0.1 10*3/uL (ref 0.0–0.1)
Basophils Relative: 1 %
Eosinophils Absolute: 0.2 10*3/uL (ref 0.0–0.5)
Eosinophils Relative: 2 %
HCT: 26.1 % — ABNORMAL LOW (ref 39.0–52.0)
Hemoglobin: 8.3 g/dL — ABNORMAL LOW (ref 13.0–17.0)
Immature Granulocytes: 1 %
Lymphocytes Relative: 7 %
Lymphs Abs: 0.5 10*3/uL — ABNORMAL LOW (ref 0.7–4.0)
MCH: 27.9 pg (ref 26.0–34.0)
MCHC: 31.8 g/dL (ref 30.0–36.0)
MCV: 87.6 fL (ref 80.0–100.0)
Monocytes Absolute: 0.8 10*3/uL (ref 0.1–1.0)
Monocytes Relative: 11 %
Neutro Abs: 5.7 10*3/uL (ref 1.7–7.7)
Neutrophils Relative %: 78 %
Platelets: 215 10*3/uL (ref 150–400)
RBC: 2.98 MIL/uL — ABNORMAL LOW (ref 4.22–5.81)
RDW: 18.7 % — ABNORMAL HIGH (ref 11.5–15.5)
WBC: 7.2 10*3/uL (ref 4.0–10.5)
nRBC: 0 % (ref 0.0–0.2)

## 2019-03-18 LAB — CBC
HCT: 26.8 % — ABNORMAL LOW (ref 39.0–52.0)
Hemoglobin: 8.6 g/dL — ABNORMAL LOW (ref 13.0–17.0)
MCH: 28.3 pg (ref 26.0–34.0)
MCHC: 32.1 g/dL (ref 30.0–36.0)
MCV: 88.2 fL (ref 80.0–100.0)
Platelets: 237 10*3/uL (ref 150–400)
RBC: 3.04 MIL/uL — ABNORMAL LOW (ref 4.22–5.81)
RDW: 18.9 % — ABNORMAL HIGH (ref 11.5–15.5)
WBC: 7.1 10*3/uL (ref 4.0–10.5)
nRBC: 0 % (ref 0.0–0.2)

## 2019-03-18 LAB — BASIC METABOLIC PANEL
Anion gap: 10 (ref 5–15)
BUN: 37 mg/dL — ABNORMAL HIGH (ref 8–23)
CO2: 29 mmol/L (ref 22–32)
Calcium: 8 mg/dL — ABNORMAL LOW (ref 8.9–10.3)
Chloride: 96 mmol/L — ABNORMAL LOW (ref 98–111)
Creatinine, Ser: 3.7 mg/dL — ABNORMAL HIGH (ref 0.61–1.24)
GFR calc Af Amer: 19 mL/min — ABNORMAL LOW (ref >60)
GFR calc non Af Amer: 16 mL/min — ABNORMAL LOW (ref >60)
Glucose, Bld: 144 mg/dL — ABNORMAL HIGH (ref 70–99)
Potassium: 3.8 mmol/L (ref 3.5–5.1)
Sodium: 135 mmol/L (ref 135–145)

## 2019-03-18 LAB — BODY FLUID CULTURE
Culture: NO GROWTH
Gram Stain: NONE SEEN

## 2019-03-18 LAB — GLUCOSE, CAPILLARY
Glucose-Capillary: 95 mg/dL (ref 70–99)
Glucose-Capillary: 103 mg/dL — ABNORMAL HIGH (ref 70–99)
Glucose-Capillary: 126 mg/dL — ABNORMAL HIGH (ref 70–99)
Glucose-Capillary: 170 mg/dL — ABNORMAL HIGH (ref 70–99)

## 2019-03-18 MED ORDER — BISACODYL 5 MG PO TBEC: 5.0000 mg | DELAYED_RELEASE_TABLET | Freq: Every day | ORAL | Status: DC | PRN

## 2019-03-18 MED ORDER — SIMETHICONE 80 MG PO CHEW
80.0000 mg | CHEWABLE_TABLET | Freq: Four times a day (QID) | ORAL | Status: DC | PRN
  Administered 2019-03-19: 80 mg via ORAL
  Filled 2019-03-18 (×2): qty 1

## 2019-03-18 NOTE — Progress Notes (Addendum)
Hematology/Oncology Progress Note [facility] Telephone:([phone] [phone]  Patient Care Team: Tonia Ghent, MD as PCP - General (Family Medicine) Thelma Comp, [place] (Optometry) Telford Nab, RN as Registered Nurse   Name of the patient: Craig Koch  [id number]  Jan 27, 1955  Date of visit: 03/18/19   INTERVAL HISTORY-  Patient was sitting in the chair.  Wife at bedside.  He reports breathing is okay today. The interval, patient has had developed worsening of breathing distress, CT 03/14/2019 showed new right pleural effusion, moderate to large in size associated with compressive atelectasis.  New ill-defined consolidation with the left lower lobe suspect pneumonia. Patient status post thoracentesis and drained 2.7 L of pleural fluid. Today he reports breathing has improved.  Currently off oxygen.  Occasionally he coughs to clear throat. Review of systems- Review of Systems  Constitutional: Positive for appetite change and fatigue.  Respiratory: Positive for cough. Negative for shortness of breath.   Gastrointestinal: Negative for abdominal pain.  Genitourinary: Negative for dysuria.   Musculoskeletal: Negative for neck pain.       Right arm pain is better  Skin: Negative for rash and wound.  Neurological: Negative for headaches.  Hematological: Negative for adenopathy. Bruises/bleeds easily.  Psychiatric/Behavioral: Negative for confusion.    Allergies  Allergen Reactions  . Lipitor [Atorvastatin Calcium] Other (See Comments)    Aches.  Tolerated crestor.     Patient Active Problem List   Diagnosis Date Noted  . AKI (acute kidney injury) ([name])   . Acute systolic heart failure ([place])   . Pain of right upper extremity   . Microcytic anemia   . Protein-calorie malnutrition, severe 03/06/2019  . Acute alteration in mental status   . Palliative care encounter   . Atrial fibrillation with RVR ([place]) 03/03/2019  . DKA (diabetic  ketoacidoses) ([place]) 03/03/2019  . Diabetes mellitus without complication ([place]) 54/62/7035  . Hypothermia 03/03/2019  . SOB (shortness of breath) 03/03/2019  . Acute renal failure ([place]) 03/03/2019  . NSTEMI (non-ST elevated myocardial infarction) ([place]) 03/03/2019  . Hyponatremia 02/08/2019  . Uncontrolled type 2 diabetes mellitus with hyperglycemia ([place]) 02/08/2019  . Leg swelling 02/08/2019  . Paronychia, finger 12/19/2018  . Encounter for antineoplastic chemotherapy 12/08/2018  . Tobacco abuse 10/12/2018  . Malignant pleural effusion   . Administrative encounter 09/23/2018  . lung adenocarcinoma 09/08/2018  . Goals of care, counseling/discussion 09/08/2018  . Recurrent pleural effusion on left 09/02/2018  . Elevated troponin 08/24/2018  . Medicare annual wellness visit, subsequent 08/07/2018  . Lower urinary tract symptoms (LUTS) 08/07/2018  . Neck pain 08/07/2018  . CAD (coronary artery disease), native coronary artery 09/23/2017  . Smoker 09/23/2017  . Chronic joint pain 10/18/2016  . Healthcare maintenance 07/17/2016  . Advance care planning 07/17/2016  . Fungal rash of torso 05/06/2016  . Trigger finger 05/06/2016  . History of alcohol use 11/17/2015  . Skin lesion 05/19/2015  . Anxiety state 02/19/2015  . Hand weakness 05/23/2011  . Diabetes mellitus with complication ([place]) 00/93/8182  . HLD (hyperlipidemia) 03/05/2010  . Essential hypertension 03/05/2010  . Coronary atherosclerosis 03/05/2010     Past Medical History:  Diagnosis Date  . Anxiety   . Arthritis   . Coronary artery disease   . Diabetes mellitus   . Dyslipidemia   . Hx of CABG   . Hyperlipidemia   . Hypertension   . Malignant neoplasm of unspecified part of unspecified bronchus or lung ([place]) 08/2018   Immunotherapy  .  Pleural effusion      Past Surgical History:  Procedure Laterality Date  . CATARACT EXTRACTION Left 09/2015  . CHEST TUBE INSERTION Left 10/01/2018   Procedure: INSERTION  PLEURAL DRAINAGE CATHETER;  Surgeon: [name], MD;  Location: [facility] ORS;  Service: Thoracic;  Laterality: Left;  . CORONARY ARTERY BYPASS GRAFT  2004   (CABG with LIMA to the  LAD, SVG to OM2/OM3, SVG  to diag  . DIALYSIS/PERMA CATHETER INSERTION N/A 03/14/2019   Procedure: DIALYSIS/PERMA CATHETER INSERTION;  Surgeon: Algernon Huxley, MD;  Location: [place] [facility];  Service: Cardiovascular;  Laterality: N/A;  . Left ankle surgery     repair of fracture  . Right lower leg surgery     rod  . TEMPORARY DIALYSIS CATHETER N/A 03/11/2019   Procedure: TEMPORARY DIALYSIS CATHETER;  Surgeon: Algernon Huxley, MD;  Location: [place] [facility];  Service: Cardiovascular;  Laterality: N/A;    Social History   Socioeconomic History  . Marital status: Married    Spouse name: Not on file  . Number of children: Not on file  . Years of education: Not on file  . Highest education level: Not on file  Occupational History  . Not on file  Tobacco Use  . Smoking status: Current Every Day Smoker    Packs/day: 0.20    Years: 45.00    Pack years: 9.00    Types: Cigarettes  . Smokeless tobacco: Former Systems developer    Types: Snuff  Substance and Sexual Activity  . Alcohol use: Yes    Alcohol/week: 6.0 standard drinks    Types: 6 Cans of beer per week    Comment: occ, average 6 pack in a week  . Drug use: No  . Sexual activity: Not Currently  Other Topics Concern  . Not on file  Social History Narrative   On disability 2009 after prev injuries and CAD.     Married 1976   2 kids, 4 grandkids.    Social Determinants of Health   Financial Resource Strain:   . Difficulty of Paying Living Expenses:   Food Insecurity:   . Worried About Charity fundraiser in the Last Year:   . Arboriculturist in the Last Year:   Transportation Needs:   . Film/video editor (Medical):   [name] Lack of Transportation (Non-Medical):   Physical Activity:   . Days of Exercise per Week:   . Minutes of Exercise per Session:    Stress:   . Feeling of Stress :   Social Connections:   . Frequency of Communication with Friends and Family:   . Frequency of Social Gatherings with Friends and Family:   . Attends Religious Services:   . Active Member of Clubs or Organizations:   . Attends Archivist Meetings:   [name] Marital Status:   Intimate Partner Violence:   . Fear of Current or Ex-Partner:   . Emotionally Abused:   [name] Physically Abused:   . Sexually Abused:      Family History  Problem Relation Age of Onset  . Dementia Mother   . Heart disease Father   . Colon cancer Neg Hx   . Prostate cancer Neg Hx   . Diabetes Neg Hx      Current Facility-Administered Medications:  .  0.9 %  sodium chloride infusion (Manually program via Guardrails IV Fluids), 250 mL, Intravenous, Once, Dew, Erskine Squibb, MD .  acetaminophen (TYLENOL) tablet 650 mg, 650  mg, Oral, Q6H PRN, Algernon Huxley, MD .  albuterol (PROVENTIL) (2.5 MG/3ML) 0.083% nebulizer solution 3 mL, 3 mL, Inhalation, Q4H PRN, Algernon Huxley, MD, 3 mL at 03/14/19 0517 .  [COMPLETED] amiodarone (PACERONE) tablet 400 mg, 400 mg, Oral, Once, 400 mg at 03/14/19 2109 **FOLLOWED BY** [COMPLETED] amiodarone (PACERONE) tablet 200 mg, 200 mg, Oral, BID, 200 mg at 03/18/19 1100 **FOLLOWED BY** [START ON 03/19/2019] amiodarone (PACERONE) tablet 200 mg, 200 mg, Oral, Daily, Dew, Erskine Squibb, MD .  apixaban (ELIQUIS) tablet 5 mg, 5 mg, Oral, BID, Nicole Kindred A, DO, 5 mg at 03/18/19 1059 .  aspirin EC tablet 81 mg, 81 mg, Oral, Daily, Algernon Huxley, MD, 81 mg at 03/18/19 1059 .  bisacodyl (DULCOLAX) EC tablet 5 mg, 5 mg, Oral, Daily PRN, Nicole Kindred A, DO .  chlorhexidine (PERIDEX) 0.12 % solution 15 mL, 15 mL, Mouth Rinse, BID, Dew, Erskine Squibb, MD, 15 mL at 03/18/19 1059 .  Chlorhexidine Gluconate Cloth 2 % PADS 6 each, 6 each, Topical, Q0600, Algernon Huxley, MD, 6 each at 03/16/19 [phone]  dextromethorphan-guaiFENesin (MUCINEX DM) 30-600 MG per 12 hr tablet 1 tablet, 1 tablet,  Oral, BID, Dew, Erskine Squibb, MD, 1 tablet at 03/18/19 1059 .  dextrose 50 % solution 0-50 mL, 0-50 mL, Intravenous, PRN, Algernon Huxley, MD, 25 mL at 03/08/19 0355 .  dronabinol (MARINOL) capsule 5 mg, 5 mg, Oral, BID AC, Dew, Erskine Squibb, MD, 5 mg at 03/18/19 1100 .  DULoxetine (CYMBALTA) DR capsule 30 mg, 30 mg, Oral, Daily, Dew, Erskine Squibb, MD, 30 mg at 03/18/19 1100 .  ferrous sulfate tablet 325 mg, 325 mg, Oral, BID WC, Dew, Erskine Squibb, MD, 325 mg at 03/18/19 1101 .  folic acid (FOLVITE) tablet 1 mg, 1 mg, Oral, Daily, Dallie Piles, RPH, 1 mg at 03/18/19 1059 .  heparin lock flush 100 unit/mL, 500 Units, Intracatheter, Daily PRN, Algernon Huxley, MD .  heparin lock flush 100 unit/mL, 250 Units, Intracatheter, PRN, Lucky Cowboy, Erskine Squibb, MD .  HYDROmorphone (DILAUDID) injection 1 mg, 1 mg, Intravenous, Once PRN, Lucky Cowboy, Erskine Squibb, MD .  insulin aspart (novoLOG) injection 0-5 Units, 0-5 Units, Subcutaneous, QHS, Algernon Huxley, MD, 2 Units at 03/17/19 2154 .  insulin aspart (novoLOG) injection 0-9 Units, 0-9 Units, Subcutaneous, TID WC, Algernon Huxley, MD, 3 Units at 03/17/19 1757 .  insulin detemir (LEVEMIR) injection 20 Units, 20 Units, Subcutaneous, QHS, Algernon Huxley, MD, 20 Units at 03/17/19 2154 .  ipratropium-albuterol (DUONEB) 0.5-2.5 (3) MG/3ML nebulizer solution 3 mL, 3 mL, Nebulization, TID, Dew, Erskine Squibb, MD, 3 mL at 03/18/19 0750 .  MEDLINE mouth rinse, 15 mL, Mouth Rinse, q12n4p, Dew, Erskine Squibb, MD, 15 mL at 03/17/19 1757 .  menthol-cetylpyridinium (CEPACOL) lozenge 3 mg, 1 lozenge, Oral, PRN, Lucky Cowboy, Erskine Squibb, MD .  metoprolol succinate (TOPROL-XL) 24 hr tablet 12.5 mg, 12.5 mg, Oral, Daily, Agbor-Etang, Brian, MD, 12.5 mg at 03/18/19 1059 .  multivitamin (RENA-VIT) tablet 1 tablet, 1 tablet, Oral, QHS, Dew, Erskine Squibb, MD, 1 tablet at 03/17/19 2154 .  multivitamin with minerals tablet 1 tablet, 1 tablet, Oral, Daily, Dew, Erskine Squibb, MD, 1 tablet at 03/18/19 1059 .  nicotine (NICODERM CQ - dosed in mg/24 hours) patch 21 mg, 21  mg, Transdermal, Daily, Dew, Erskine Squibb, MD, 21 mg at 03/18/19 1146 .  ondansetron (ZOFRAN) injection 4 mg, 4 mg, Intravenous, Q8H PRN, Lucky Cowboy, Erskine Squibb, MD .  ondansetron [facility]) injection  4 mg, 4 mg, Intravenous, Q6H PRN, Dew, Erskine Squibb, MD .  oxymetazoline (AFRIN) 0.05 % nasal spray 1 spray, 1 spray, Each Nare, BID, Dew, Erskine Squibb, MD, 1 spray at 03/18/19 1000 .  piperacillin-tazobactam (ZOSYN) IVPB 3.375 g, 3.375 g, Intravenous, Q12H, Nicole Kindred A, DO, Last Rate: 12.5 mL/hr at 03/18/19 1058, 3.375 g at 03/18/19 1058 .  pneumococcal 23 valent vaccine (PNEUMOVAX-23) injection 0.5 mL, 0.5 mL, Intramuscular, Prior to discharge, Sreenath, Sudheer B, MD .  polyethylene glycol (MIRALAX / GLYCOLAX) packet 17 g, 17 g, Oral, Daily, Dew, Erskine Squibb, MD, 17 g at 03/18/19 1101 .  protein supplement (ENSURE MAX) liquid, 11 oz, Oral, BID BM, Algernon Huxley, MD, 11 oz at 03/18/19 1147 .  rosuvastatin (CRESTOR) tablet 10 mg, 10 mg, Oral, Daily, Dew, Erskine Squibb, MD, 10 mg at 03/18/19 1059 .  simethicone (MYLICON) chewable tablet 80 mg, 80 mg, Oral, QID PRN, Nicole Kindred A, DO .  sodium chloride (OCEAN) 0.65 % nasal spray 1 spray, 1 spray, Each Nare, PRN, Algernon Huxley, MD, 1 spray at 03/11/19 0503 .  sodium chloride flush (NS) 0.9 % injection 10-40 mL, 10-40 mL, Intracatheter, Q12H, Dew, Erskine Squibb, MD, 10 mL at 03/18/19 1101 .  sodium chloride flush (NS) 0.9 % injection 10-40 mL, 10-40 mL, Intracatheter, PRN, Lucky Cowboy, Erskine Squibb, MD .  thiamine tablet 100 mg, 100 mg, Oral, Daily, Dallie Piles, RPH, 100 mg at 03/18/19 1059   Physical exam:  Vitals:   03/18/19 0403 03/18/19 0722 03/18/19 0750 03/18/19 1131  BP: 114/60 117/61  124/64  Pulse: 77 75  80  Resp: _0 Temp: 98 F (36.7 C) (!) 97.4 F (36.3 C)  97.6 F (36.4 C)  TempSrc: Oral Oral  Oral  SpO2: 97% 96% 96% 100%  Weight: 200 lb 6.4 oz (90.9 kg)     Height:       Physical Exam  Constitutional: He is oriented to person, place, and time. No distress.    HENT:  Head: Normocephalic and atraumatic.  Eyes: Pupils are equal, round, and reactive to light. EOM are normal. No scleral icterus.  Cardiovascular: Normal rate and regular rhythm.  No murmur heard. Pulmonary/Chest: Effort normal.  Decreased breath sound at the bases.  Few crackles on the right side.  Abdominal: Soft.  Musculoskeletal:        General: No edema. Normal range of motion.     Cervical back: Normal range of motion and neck supple.  Neurological: He is alert and oriented to person, place, and time.  Skin: Skin is warm and dry. No erythema.  Ecchymosis right upper extremity. right IJ permacath and femoral catheter.  Psychiatric: Affect normal.       CMP Latest Ref Rng & Units 03/18/2019  Glucose 70 - 99 mg/dL 144(H)  BUN 8 - 23 mg/dL 37(H)  Creatinine 0.61 - 1.24 mg/dL 3.70(H)  Sodium 135 - 145 mmol/L 135  Potassium 3.5 - 5.1 mmol/L 3.8  Chloride 98 - 111 mmol/L 96(L)  CO2 22 - 32 mmol/L 29  Calcium 8.9 - 10.3 mg/dL 8.0(L)  Total Protein 6.5 - 8.1 g/dL -  Total Bilirubin 0.3 - 1.2 mg/dL -  Alkaline Phos 38 - 126 U/L -  AST 15 - 41 U/L -  ALT 0 - 44 U/L -   CBC Latest Ref Rng & Units 03/18/2019  WBC 4.0 - 10.5 K/uL 7.2  Hemoglobin 13.0 - 17.0 g/dL 8.3(L)  Hematocrit 39.0 -  52.0 % 26.1(L)  Platelets 150 - 400 K/uL 215    RADIOGRAPHIC STUDIES: I have personally reviewed the radiological images as listed and agreed with the findings in the report. DG Chest 1 View  Result Date: 03/14/2019 CLINICAL DATA:  Wheezing EXAM: CHEST  1 VIEW COMPARISON:  03/03/2019 FINDINGS: Borderline heart size.  CABG. Diffuse interstitial opacity with Kerley lines and left more than right pleural effusion. No pneumothorax. IMPRESSION: CHF. Electronically Signed   By: Monte Fantasia M.D.   On: 03/14/2019 05:47   CT HEAD WO CONTRAST  Result Date: 03/05/2019 CLINICAL DATA:  Metastatic disease evaluation.  Lung cancer. EXAM: CT HEAD WITHOUT CONTRAST TECHNIQUE: Contiguous axial images  were obtained from the base of the skull through the vertex without intravenous contrast. COMPARISON:  MRI head 09/20/2018 FINDINGS: Brain: Mild atrophy. Mild hypodensity in the white matter unchanged from the prior MRI. No acute infarct, hemorrhage, mass. No edema or midline shift. Vascular: Negative for hyperdense vessel Skull: Negative skull. Sinuses/Orbits: Mucosal edema and bony thickening right maxillary sinus. Chronic nasal bone fracture. Left cataract extraction. Other: None IMPRESSION: No acute abnormality and negative for metastatic disease on unenhanced CT. Atrophy and mild chronic microvascular ischemia. Electronically Signed   By: Franchot Gallo M.D.   On: 03/05/2019 15:28   CT CHEST WO CONTRAST  Result Date: 03/14/2019 CLINICAL DATA:  Respiratory failure. EXAM: CT CHEST WITHOUT CONTRAST TECHNIQUE: Multidetector CT imaging of the chest was performed following the standard protocol without IV contrast. COMPARISON:  Chest CT dated 01/22/2019. FINDINGS: Cardiovascular: Heart size appears stable. No pericardial effusion. Diffuse coronary artery calcifications. Surgical changes of median sternotomy for presumed CABG. Mediastinum/Nodes: No mass or enlarged lymph nodes seen within the mediastinum. Esophagus appears normal. Trachea is unremarkable. Lungs/Pleura: New RIGHT pleural effusion, moderate to large in size. Associated compressive atelectasis within the RIGHT perihilar and lower lobe. New ill-defined consolidation within the LEFT lower lobe, suspected pneumonia. Probable additional rounded atelectasis within the lingula. Diffuse interstitial thickening indicating some degree of volume overload versus atypical infection. Upper Abdomen: Limited images of the upper abdomen are unremarkable. Musculoskeletal: No acute or suspicious osseous finding. IMPRESSION: 1. New ill-defined consolidation within the LEFT lower lobe, suspected pneumonia. 2. New moderate to large-sized RIGHT pleural effusion, with  associated compressive atelectasis. 3. Diffuse interstitial thickening, most likely edema/fluid overload, less likely additional atypical infection. 4. Probable additional rounded atelectasis within the lingula. Aortic Atherosclerosis (ICD10-I70.0). Electronically Signed   By: Franki Cabot M.D.   On: 03/14/2019 19:47   US RENAL  Result Date: 03/10/2019 CLINICAL DATA:  64 year old presenting with acute renal failure. Evaluate for obstruction as a possible cause. EXAM: RENAL / URINARY TRACT ULTRASOUND COMPLETE COMPARISON:  Urinary tract ultrasound 03/04/2019. CT abdomen and pelvis 01/22/2019. FINDINGS: Right Kidney: Renal measurements: Approximately 12.6 x 6.4 x 6.2 cm = volume: 264 mL. Mildly echogenic parenchyma. No hydronephrosis. Well-preserved cortex. No shadowing calculi. No focal parenchymal abnormality. Left Kidney: Renal measurements: Approximately 12.5 x 6.3 x 6.9 cm = volume: 283 mL. Mildly echogenic parenchyma. No hydronephrosis. Well-preserved cortex. No shadowing calculi. No focal parenchymal abnormality. Bladder: Normal for degree of bladder distention. Other: None. IMPRESSION: 1. Mildly echogenic renal parenchyma indicative of medical renal disease. 2. Otherwise normal examination. Specifically, no evidence of urinary tract obstruction to account for the acute renal insufficiency. Electronically Signed   By: Evangeline Dakin M.D.   On: 03/10/2019 15:02   US RENAL  Result Date: 03/04/2019 CLINICAL DATA:  Acute kidney injury EXAM: RENAL / URINARY  TRACT ULTRASOUND COMPLETE COMPARISON:  CT 01/22/2019 FINDINGS: Right Kidney: Renal measurements: 11.4 x 6 x 6 cm = volume: 215 mL . Echogenicity within normal limits. No mass or hydronephrosis visualized. Left Kidney: Renal measurements: 11.8 x 7.3 x 6.5 cm = volume: 291.8 mL. Echogenicity within normal limits. No mass or hydronephrosis visualized. Bladder: Appears normal for degree of bladder distention. Other: Incidental note made of gallstones and  small effusions. Heterogenous prostate. IMPRESSION: Normal ultrasound appearance of the kidneys Electronically Signed   By: Donavan Foil M.D.   On: 03/04/2019 15:37   PERIPHERAL VASCULAR CATHETERIZATION  Result Date: 03/14/2019 See op note  PERIPHERAL VASCULAR CATHETERIZATION  Result Date: 03/11/2019 See op note  DG Chest Port 1 View  Result Date: 03/15/2019 CLINICAL DATA:  Status post thoracentesis. EXAM: PORTABLE CHEST 1 VIEW COMPARISON:  03/14/2019 FINDINGS: Interval decrease right pleural effusion. No evidence for pneumothorax. Bibasilar atelectasis/infiltrate again noted with persistent small left pleural effusion. The cardiopericardial silhouette is within normal limits for size. Pulmonary vascular congestion has decreased in the interval. Right IJ central line tip overlies the mid to distal SVC. The visualized bony structures of the thorax are intact. Telemetry leads overlie the chest. IMPRESSION: Interval decrease in right pleural effusion without evidence of pneumothorax. Decrease in pulmonary vascular congestion with persistent bibasilar atelectasis/infiltrate and small left pleural effusion. Electronically Signed   By: Misty Stanley M.D.   On: 03/15/2019 11:36   DG Chest Portable 1 View  Result Date: 03/03/2019 CLINICAL DATA:  Shortness of breath, since Monday worsening this morning around 1 a.m. EXAM: PORTABLE CHEST 1 VIEW COMPARISON:  12/19/2018 FINDINGS: Cardiomediastinal contours are stable following median sternotomy with persistent left-sided pleural effusion and pleural-parenchymal scarring. No new areas of consolidation or evidence of right-sided pleural effusion. No signs of acute bone process. IMPRESSION: Persistent left-sided pleural effusion and pleural-parenchymal scarring. Electronically Signed   By: Zetta Bills M.D.   On: 03/03/2019 13:59   ECHOCARDIOGRAM COMPLETE  Result Date: 03/15/2019    ECHOCARDIOGRAM REPORT   Patient Name:   MARCIANO MUNDT Date of Exam:  03/15/2019 Medical Rec #:  [id number]       Height:       72.0 in Accession #:    [id number]      Weight:       201.4 lb Date of Birth:  1955/10/07       BSA:          2.137 m Patient Age:    22 years        BP:           125/69 mmHg Patient Gender: M               HR:           83 bpm. Exam Location:  [facility] Procedure: 2D Echo, Cardiac Doppler and Color Doppler Indications:     Respiratory distress  History:         Patient has prior history of Echocardiogram examinations, most                  recent 03/04/2019. Prior CABG; Risk Factors:Hypertension and                  Diabetes.  Sonographer:     Sherrie Sport RDCS (AE) Referring Phys:  [id number] Claiborne Billings A GRIFFITH Diagnosing Phys: Kathlyn Sacramento MD IMPRESSIONS  1. Left ventricular ejection fraction, by estimation, is 35 to 40%. The left ventricle has moderately decreased function.  The left ventricle demonstrates regional wall motion abnormalities . The left ventricular internal cavity size was mildly dilated. There is mild left ventricular hypertrophy. Left ventricular diastolic parameters are consistent with Grade II diastolic dysfunction (pseudonormalization). There is moderate hypokinesis of the left ventricular, entire inferior wall.  2. Right ventricular systolic function is normal. The right ventricular size is normal. There is moderately elevated pulmonary artery systolic pressure. The estimated right ventricular systolic pressure is 12.2 mmHg.  3. Left atrial size was mildly dilated.  4. The mitral valve is normal in structure. Moderate mitral valve regurgitation. No evidence of mitral stenosis.  5. The aortic valve is abnormal. Aortic valve regurgitation is mild. Mild to moderate aortic valve sclerosis/calcification is present, without any evidence of aortic stenosis.  6. The inferior vena cava is normal in size with greater than 50% respiratory variability, suggesting right atrial pressure of 3 mmHg. FINDINGS  Left Ventricle: Left ventricular ejection fraction, by  estimation, is 35 to 40%. The left ventricle has moderately decreased function. The left ventricle demonstrates regional wall motion abnormalities. Moderate hypokinesis of the left ventricular, entire inferior wall. The left ventricular internal cavity size was mildly dilated. There is mild left ventricular hypertrophy. Left ventricular diastolic parameters are consistent with Grade II diastolic dysfunction (pseudonormalization). Right Ventricle: The right ventricular size is normal. No increase in right ventricular wall thickness. Right ventricular systolic function is normal. There is moderately elevated pulmonary artery systolic pressure. The tricuspid regurgitant velocity is 3.37 m/s, and with an assumed right atrial pressure of 3 mmHg, the estimated right ventricular systolic pressure is 48.2 mmHg. Left Atrium: Left atrial size was mildly dilated. Right Atrium: Right atrial size was normal in size. Pericardium: There is no evidence of pericardial effusion. Mitral Valve: The mitral valve is normal in structure. Normal mobility of the mitral valve leaflets. Moderate mitral valve regurgitation. No evidence of mitral valve stenosis. Tricuspid Valve: The tricuspid valve is normal in structure. Tricuspid valve regurgitation is mild . No evidence of tricuspid stenosis. Aortic Valve: The aortic valve is abnormal. Aortic valve regurgitation is mild. Mild to moderate aortic valve sclerosis/calcification is present, without any evidence of aortic stenosis. Aortic valve mean gradient measures 4.5 mmHg. Aortic valve peak gradient measures 8.4 mmHg. Aortic valve area, by VTI measures 2.54 cm. Pulmonic Valve: The pulmonic valve was normal in structure. Pulmonic valve regurgitation is not visualized. No evidence of pulmonic stenosis. Aorta: The aortic root is normal in size and structure. Venous: The inferior vena cava is normal in size with greater than 50% respiratory variability, suggesting right atrial pressure of 3 mmHg.  IAS/Shunts: No atrial level shunt detected by color flow Doppler.  LEFT VENTRICLE PLAX 2D LVIDd:         5.34 cm      Diastology LVIDs:         4.38 cm      LV e' lateral:   5.77 cm/s LV PW:         1.00 cm      LV E/e' lateral: 22.4 LV IVS:        1.20 cm      LV e' medial:    5.00 cm/s LVOT diam:     2.00 cm      LV E/e' medial:  25.8 LV SV:         63 LV SV Index:   30 LVOT Area:     3.14 cm  LV Volumes (MOD) LV vol d, MOD A2C: 227.0 ml  LV vol d, MOD A4C: 148.0 ml LV vol s, MOD A2C: 142.0 ml LV vol s, MOD A4C: 110.0 ml LV SV MOD A2C:     85.0 ml LV SV MOD A4C:     148.0 ml LV SV MOD BP:      59.1 ml RIGHT VENTRICLE RV Basal diam:  4.14 cm RV S prime:     10.30 cm/s TAPSE (M-mode): 3.2 cm LEFT ATRIUM             Index       RIGHT ATRIUM           Index LA diam:        4.80 cm 2.25 cm/m  RA Area:     15.70 cm LA Vol (A2C):   68.8 ml 32.20 ml/m RA Volume:   40.00 ml  18.72 ml/m LA Vol (A4C):   77.9 ml 36.45 ml/m LA Biplane Vol: 75.1 ml 35.14 ml/m  AORTIC VALVE                   PULMONIC VALVE AV Area (Vmax):    2.41 cm    PV Vmax:        0.99 m/s AV Area (Vmean):   2.15 cm    PV Peak grad:   3.9 mmHg AV Area (VTI):     2.54 cm    RVOT Peak grad: 4 mmHg AV Vmax:           144.50 cm/s AV Vmean:          94.950 cm/s AV VTI:            0.249 m AV Peak Grad:      8.4 mmHg AV Mean Grad:      4.5 mmHg LVOT Vmax:         111.00 cm/s LVOT Vmean:        65.100 cm/s LVOT VTI:          0.201 m LVOT/AV VTI ratio: 0.81  AORTA Ao Root diam: 2.90 cm MITRAL VALVE                TRICUSPID VALVE MV Area (PHT): 5.13 cm     TR Peak grad:   45.4 mmHg MV Decel Time: 148 msec     TR Vmax:        337.00 cm/s MV E velocity: 129.00 cm/s MV A velocity: 68.40 cm/s   SHUNTS MV E/A ratio:  1.89         Systemic VTI:  0.20 m                             Systemic Diam: 2.00 cm Kathlyn Sacramento MD Electronically signed by Kathlyn Sacramento MD Signature Date/Time: 03/15/2019/11:27:06 AM    Final    ECHOCARDIOGRAM COMPLETE  Result Date:  03/04/2019    ECHOCARDIOGRAM REPORT   Patient Name:   Tretha Sciara Date of Exam: 03/04/2019 Medical Rec #:  [id number]       Height:       72.0 in Accession #:    [id number]      Weight:       185.0 lb Date of Birth:  [birth year]       BSA:          2.061 m Patient Age:    83 years        BP:  125/72 mmHg Patient Gender: M               HR:           119 bpm. Exam Location:  [facility] Procedure: 2D Echo, Color Doppler and Cardiac Doppler Indications:     I21.4 NSTEMI  History:         Patient has prior history of Echocardiogram examinations, most                  recent 08/24/2018. CAD, Prior CABG; Risk Factors:Hypertension,                  Dyslipidemia and Diabetes. Lung Cancer.  Sonographer:     Charmayne Sheer RDCS (AE) Referring Phys:  [place] Phys: Kathlyn Sacramento MD IMPRESSIONS  1. Left ventricular ejection fraction, by estimation, is 30 to 35%. The left ventricle has moderately decreased function. The left ventricle demonstrates global hypokinesis. Left ventricular diastolic parameters are indeterminate.  2. Right ventricular systolic function is normal. The right ventricular size is normal. Tricuspid regurgitation signal is inadequate for assessing PA pressure.  3. Left atrial size was mild to moderately dilated.  4. The mitral valve is normal in structure and function. Moderate mitral valve regurgitation. No evidence of mitral stenosis.  5. The aortic valve is normal in structure and function. Aortic valve regurgitation is trivial. Mild to moderate aortic valve sclerosis/calcification is present, without any evidence of aortic stenosis.  6. The inferior vena cava is normal in size with greater than 50% respiratory variability, suggesting right atrial pressure of 3 mmHg. FINDINGS  Left Ventricle: Left ventricular ejection fraction, by estimation, is 30 to 35%. The left ventricle has moderately decreased function. The left ventricle demonstrates global hypokinesis. The left ventricular  internal cavity size was normal in size. There is no left ventricular hypertrophy. Left ventricular diastolic parameters are indeterminate. Right Ventricle: The right ventricular size is normal. No increase in right ventricular wall thickness. Right ventricular systolic function is normal. Tricuspid regurgitation signal is inadequate for assessing PA pressure. Left Atrium: Left atrial size was mild to moderately dilated. Right Atrium: Right atrial size was normal in size. Pericardium: There is no evidence of pericardial effusion. Mitral Valve: The mitral valve is normal in structure and function. Normal mobility of the mitral valve leaflets. Moderate mitral valve regurgitation. No evidence of mitral valve stenosis. MV peak gradient, 7.5 mmHg. The mean mitral valve gradient is 4.0  mmHg. Tricuspid Valve: The tricuspid valve is normal in structure. Tricuspid valve regurgitation is not demonstrated. No evidence of tricuspid stenosis. Aortic Valve: The aortic valve is normal in structure and function. Aortic valve regurgitation is trivial. Aortic regurgitation PHT measures 281 msec. Mild to moderate aortic valve sclerosis/calcification is present, without any evidence of aortic stenosis. Aortic valve mean gradient measures 5.0 mmHg. Aortic valve peak gradient measures 8.5 mmHg. Aortic valve area, by VTI measures 2.53 cm. Pulmonic Valve: The pulmonic valve was normal in structure. Pulmonic valve regurgitation is not visualized. No evidence of pulmonic stenosis. Aorta: The aortic root is normal in size and structure. Venous: The inferior vena cava is normal in size with greater than 50% respiratory variability, suggesting right atrial pressure of 3 mmHg. IAS/Shunts: No atrial level shunt detected by color flow Doppler.  LEFT VENTRICLE PLAX 2D LVIDd:         5.21 cm  Diastology LVIDs:         4.21 cm  LV e' lateral:   7.83  cm/s LV PW:         0.84 cm  LV E/e' lateral: 13.4 LV IVS:        0.89 cm  LV e' medial:    7.94  cm/s LVOT diam:     2.40 cm  LV E/e' medial:  13.2 LV SV:         54 LV SV Index:   26 LVOT Area:     4.52 cm  RIGHT VENTRICLE RV Basal diam:  3.08 cm LEFT ATRIUM             Index       RIGHT ATRIUM           Index LA diam:        4.70 cm 2.28 cm/m  RA Area:     14.10 cm LA Vol (A2C):   62.0 ml 30.08 ml/m RA Volume:   35.20 ml  17.08 ml/m LA Vol (A4C):   68.6 ml 33.29 ml/m LA Biplane Vol: 65.3 ml 31.68 ml/m  AORTIC VALVE                    PULMONIC VALVE AV Area (Vmax):    2.95 cm     PV Vmax:       1.10 m/s AV Area (Vmean):   2.86 cm     PV Vmean:      68.400 cm/s AV Area (VTI):     2.53 cm     PV VTI:        0.146 m AV Vmax:           146.00 cm/s  PV Peak grad:  4.8 mmHg AV Vmean:          103.000 cm/s PV Mean grad:  2.0 mmHg AV VTI:            0.213 m AV Peak Grad:      8.5 mmHg AV Mean Grad:      5.0 mmHg LVOT Vmax:         95.30 cm/s LVOT Vmean:        65.200 cm/s LVOT VTI:          0.119 m LVOT/AV VTI ratio: 0.56 AI PHT:            281 msec  AORTA Ao Root diam: 3.00 cm MITRAL VALVE MV Area (PHT): 3.92 cm     SHUNTS MV Peak grad:  7.5 mmHg     Systemic VTI:  0.12 m MV Mean grad:  4.0 mmHg     Systemic Diam: 2.40 cm MV Vmax:       1.37 m/s MV Vmean:      92.7 cm/s MV Decel Time: 194 msec MV E velocity: 104.80 cm/s Kathlyn Sacramento MD Electronically signed by Kathlyn Sacramento MD Signature Date/Time: 03/04/2019/2:46:43 PM    Final    [place] RT UPPER EXTREM LTD SOFT TISSUE NON VASCULAR  Result Date: 03/08/2019 CLINICAL DATA:  Swelling and bruising of the right upper extremity. Possible hematoma. EXAM: ULTRASOUND right UPPER EXTREMITY LIMITED TECHNIQUE: Ultrasound examination of the upper extremity soft tissues was performed in the area of clinical concern. COMPARISON:  None. FINDINGS: Diffuse subcutaneous soft tissue swelling/edema and streaky fluid. No discrete fluid collection or hematoma. The underlying musculature is grossly normal. IMPRESSION: Diffuse subcutaneous soft tissue swelling/edema/streaky fluid  but no discrete abscess or hematoma. Electronically Signed   By: Marijo Sanes M.D.   On: 03/08/2019 19:13   US THORACENTESIS ASP  PLEURAL SPACE W/IMG GUIDE  Result Date: 03/15/2019 INDICATION: History of lung cancer with recurrent right pleural effusion. Request for diagnostic and therapeutic thoracentesis. EXAM: ULTRASOUND GUIDED RIGHT THORACENTESIS MEDICATIONS: 1% lidocaine 10 mL COMPLICATIONS: None immediate. PROCEDURE: An ultrasound guided thoracentesis was thoroughly discussed with the patient and questions answered. The benefits, risks, alternatives and complications were also discussed. The patient understands and wishes to proceed with the procedure. Written consent was obtained. Ultrasound was performed to localize and mark an adequate pocket of fluid in the right chest. The area was then prepped and draped in the normal sterile fashion. 1% Lidocaine was used for local anesthesia. Under ultrasound guidance a 6 Fr Safe-T-Centesis catheter was introduced. Thoracentesis was performed. The catheter was removed and a dressing applied. FINDINGS: A total of approximately 2.7 L of clear yellow fluid was removed. Samples were sent to the laboratory as requested by the clinical team. IMPRESSION: Successful ultrasound guided right thoracentesis yielding 2.7 L of pleural fluid. No pneumothorax on post-procedure chest x-ray. Read by: Gareth Eagle, PA-C Electronically Signed   By: Sandi Mariscal M.D.   On: 03/15/2019 12:44    Assessment and plan-  Patient is a 64 y.o. male with history of stage IV lung cancer, BRAF mutation on targeted therapy, hypertension, hyperlipidemia, CAD with history of CABG, diabetes, anxiety presented to emergency room due to breathing difficulties.he was found have DKA, AKI, mental status change.  #Acute renal failure, ATN, creatinine continues to rise.  Continue hemodialysis per nephrology.  Patient has right IJ permacath and right femoral catheter.  The latter will be discontinued  today.  #Acute NSTEMI /atrial fibrillation with RVR, cardiology on board.  Acute CHF, ejection fraction decreased to 30%.   Patient is on amiodarone, Eliquis 5 mg twice daily.  Aspirin, Crestor, beta-blocker.  Cardiology on board.  Not on ACE inhibitor or ARB due to renal failure.  #Normocytic anemia,  Continue oral iron supplementation.  Microcytosis has resolved.  Hemoglobin 8.3. Anemia is multifactorial.  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    Hold off targeted therapy due to acute illness.  He may be a candidate of outpatient immunotherapy after he recovers from current acute illness.  He will follow-up next week with me at the cancer center for post hospitalization follow-up, if condition is stable, will initiate immunotherapy.    #Right pleural effusion, status post thoracentesis.  Not meeting lights criteria, likely transudate.   Cytospin is negative for malignant cells.  #New ill-defined consolidation with the left lower lobe, suspect pneumonia.  Continue Zosyn.  Monitor recurrence.  CODE STATUS, DNR/DNI. Thank you for allowing me to participate in the care of this patient.   Earlie Server, MD, PhD Hematology Oncology [facility] for initiation at [place]- [phone] 03/18/2019

## 2019-03-18 NOTE — Progress Notes (Signed)
Occupational Therapy Treatment Patient Details Name: Craig Koch MRN: [id number] DOB: 05/07/1955 Today's Date: 03/18/2019    History of present illness Pt is 64 y.o. male with hypertension, hyperlipidemia, diabetes, depression, anxiety, adenocarcinoma of the lung stage IV, coronary disease, history of CABG, tobacco and h/o alcohol abuse, left pleural effusion, admitted on 03/03/2019 for DKA. Pt hospital stay complicated by new onset afib with RVR and non-ST elevated MI. Pt has also had a thoracentesis during stay. Pt underwent perm cath placement. Temp cath still in place, per nephrology okay to mobilize patient (planning to remove).   OT comments  Pt seen for OT tx this date to f/u re: safety and indep with self care ADLs/ADL mobility. Pt agreeable to attempt to get OOB this date and okay'ed by nephrologist as temp HD catheter to be removed this date. Pt able to perform sup to sit with MIN A only with use of bed rail and with increased speed demonstrating improved tolerance this date. Pt able to demo fair to good static sitting balance and tolerates participating in seated grooming-washing face and oral care this date with setup. Pt noted to have improved fine motor control/strength this date to open caps/wrappers of ADL items. OT and PT facilitate pt t/f with RW with MIN A +2 from bed to chair. (sit to stand completed first with same level of assist and BP taken to compare to sitting, orthostatics assessed and [facility]). Pt requires MIN verbal cues for safe hand placement relative to RW as well as MIN verbal cues to feel chair behind legs prior to initiating descent into sitting. Pt reports being comfortable and happy to be up in chair at end of session. Anticipate that SNF is still most appropriate d/c recommendation at this time. Will continue to assess.    Follow Up Recommendations  SNF    Equipment Recommendations  Other (comment)(defer to next level of care. Anticipate pt will need two wheeled RW  regardless of d/c setting.)    Recommendations for Other Services      Precautions / Restrictions Precautions Precautions: Fall Restrictions Weight Bearing Restrictions: No       Mobility Bed Mobility Overal bed mobility: Needs Assistance Bed Mobility: Supine to Sit     Supine to sit: Min assist     General bed mobility comments: pt initiates sup to sit with use of bed rail and extended timem, much improved versus last session  Transfers Overall transfer level: Needs assistance Equipment used: Rolling walker (2 wheeled) Transfers: Sit to/from [facility] Sit to Stand: Min assist;+2 physical assistance;+2 safety/equipment Stand pivot transfers: Min assist;+2 physical assistance;+2 safety/equipment       General transfer comment: On first sit to stand, BP compared to sitting BP (119/60 sitting, 117/56 standing) and pt tolerates ~2 mins static standing, but then requires seated rest break for ~2 mins. Pt then able to perform SPS with RW with MIN A +2 with MIN verbal cues for safe hand placement with use of RW.    Balance Overall balance assessment: Needs assistance Sitting-balance support: Feet supported;Single extremity supported Sitting balance-Leahy Scale: Fair Sitting balance - Comments: pt reports some dizziness with sup to sit transition but dissipates some with time. (-) orthostatics     Standing balance-Leahy Scale: Poor Standing balance comment: reliant on RW for balance in static standing                           ADL either  performed or assessed with clinical judgement   ADL       Grooming: Wash/dry face;Oral care;Set up;Sitting                                       Vision Patient Visual Report: No change from baseline     Perception     Praxis      Cognition Arousal/Alertness: Awake/alert Behavior During Therapy: WFL for tasks assessed/performed Overall Cognitive Status: Within Functional Limits for  tasks assessed                                 General Comments: pt much more awake/participatory this date.        Exercises Other Exercises Other Exercises: OT facilitates education re: importance of oral care even if pt has dentures on top-to brush mouth/tounge to prevent growth of bacteria and decrease risk of heart disease. Other Exercises: pt performed seated exercises with supervision and visual cues; LAQx10 bilaterally, heel/toe raises x10   Shoulder Instructions       General Comments      Pertinent Vitals/ Pain       Pain Assessment: Faces Faces Pain Scale: Hurts a little bit Pain Location: Pt reported some R arm pain, and then R sided headache pain after mobilization. Pain Descriptors / Indicators: Grimacing Pain Intervention(s): Monitored during session;Repositioned;Other (comment)(RN notified)  Home Living                                          Prior Functioning/Environment              Frequency  Min 2X/week        Progress Toward Goals  OT Goals(current goals can now be found in the care plan section)  Progress towards OT goals: Progressing toward goals  Acute Rehab OT Goals Patient Stated Goal: get stronger OT Goal Formulation: With patient/family Time For Goal Achievement: 03/22/19 Potential to Achieve Goals: Good  Plan Discharge plan remains appropriate    Co-evaluation    PT/OT/SLP Co-Evaluation/Treatment: Yes Reason for Co-Treatment: Complexity of the patient's impairments (multi-system involvement);For patient/therapist safety PT goals addressed during session: Mobility/safety with mobility;Strengthening/ROM OT goals addressed during session: ADL's and self-care;Proper use of Adaptive equipment and DME;Strengthening/ROM      AM-PAC OT "6 Clicks" Daily Activity     Outcome Measure   Help from another person eating meals?: None(improved [place] to open containers/wrappers today) Help from another person  taking care of personal grooming?: A Little Help from another person toileting, which includes using toliet, bedpan, or urinal?: A Lot Help from another person bathing (including washing, rinsing, drying)?: A Lot Help from another person to put on and taking off regular upper body clothing?: A Lot Help from another person to put on and taking off regular lower body clothing?: A Lot 6 Click Score: 15    End of Session Equipment Utilized During Treatment: Gait belt;Rolling walker  OT Visit Diagnosis: Unsteadiness on feet (R26.81);Muscle weakness (generalized) (M62.81)   Activity Tolerance Patient limited by fatigue   Patient Left with family/visitor present;in chair;with call bell/phone within reach;with chair alarm set;with nursing/sitter in room(nurse present to administer meds)   Nurse Communication  Time: 1638-4536 OT Time Calculation (min): 29 min  Charges: OT General Charges $OT Visit: 1 Visit OT Treatments $Self Care/Home Management : 8-22 mins  Gerrianne Scale, MS, OTR/L ascom [phone] 03/18/19, 4:13 PM

## 2019-03-18 NOTE — Progress Notes (Signed)
[facility]  ROUNDING NOTE   Subjective:   Wife at bedside.  UOP 450  Objective:  Vital signs in last 24 hours:  Temp:  [97.4 F (36.3 C)-98.8 F (37.1 C)] 97.4 F (36.3 C) (03/15 0722) Pulse Rate:  [75-79] 75 (03/15 0722) Resp:  [18-20] 18 (03/15 0722) BP: (113-117)/(52-67) 117/61 (03/15 0722) SpO2:  [93 %-99 %] 96 % (03/15 0750) Weight:  [90.9 kg] 90.9 kg (03/15 0403)  Weight change:  Filed Weights   03/15/19 0422 03/16/19 0424 03/18/19 0403  Weight: 91.4 kg 87.9 kg 90.9 kg    Intake/Output: I/O last 3 completed shifts: In: 120 [P.O.:120] Out: 700 [Urine:700]   Intake/Output this shift:  Total I/O In: 240 [P.O.:240] Out: 0   Physical Exam: General: NAD,   Head: Normocephalic, atraumatic. Moist oral mucosal membranes  Eyes: Anicteric, PERRL  Neck: Supple, trachea midline  Lungs:  Clear to auscultation  Heart: Regular rate and rhythm  Abdomen:  Soft, nontender,   Extremities:  no peripheral edema.  Neurologic: Nonfocal, moving all four extremities  Skin: No lesions  Access: RIJ permcath, right temp HD femoral cath    Basic Metabolic Panel: Recent Labs  Lab 03/12/19 1012 03/13/19 0417 03/14/19 0520 03/14/19 0520 03/14/19 1133 03/15/19 0554 03/15/19 0554 03/16/19 0432 03/17/19 0440 03/18/19 0541  NA  --    < > 135  --   --  135  --  135 136 135  K  --    < > 4.5  --   --  3.8  --  4.2 3.6 3.8  CL  --    < > 96*  --   --  96*  --  98 97* 96*  CO2  --    < > 24  --   --  24  --  29 29 29   GLUCOSE  --    < > 265*  --   --  159*  --  251* 154* 144*  BUN  --    < > 33*  --   --  25*  --  19 28* 37*  CREATININE  --    < > 3.17*  --   --  2.81*  --  2.49* 3.09* 3.70*  CALCIUM  --    < > 8.0*   < >  --  8.0*   < > 7.7* 8.1* 8.0*  PHOS 4.7*  --   --   --  2.8  --   --   --   --   --    < > = values in this interval not displayed.    Liver Function Tests: Recent Labs  Lab 03/16/19 0927  PROT 5.7*   No results for input(s): LIPASE,  AMYLASE in the last 168 hours. No results for input(s): AMMONIA in the last 168 hours.  CBC: Recent Labs  Lab 03/14/19 0520 03/15/19 0554 03/16/19 0432 03/17/19 0440 03/18/19 0541  WBC 6.9 7.0 5.3 6.7 7.2  NEUTROABS  --   --   --   --  5.7  HGB 9.7* 9.3* 8.2* 8.8* 8.3*  HCT 29.0* 29.6* 25.4* 26.9* 26.1*  MCV 83.8 87.6 87.3 87.6 87.6  PLT 302 238 223 226 215    Cardiac Enzymes: No results for input(s): CKTOTAL, CKMB, CKMBINDEX, TROPONINI in the last 168 hours.  BNP: Invalid input(s): POCBNP  CBG: Recent Labs  Lab 03/17/19 0740 03/17/19 1123 03/17/19 1648 03/17/19 2012 03/18/19 0725  GLUCAP 90 151* 213* 224*  95    Microbiology: Results for orders placed or performed during the hospital encounter of 03/03/19  Respiratory Panel by RT PCR (Flu A&B, Covid) - Nasopharyngeal Swab     Status: None   Collection Time: 03/03/19  3:31 PM   Specimen: Nasopharyngeal Swab  Result Value Ref Range Status   SARS Coronavirus 2 by RT PCR NEGATIVE NEGATIVE Final    Comment: (NOTE) SARS-CoV-2 target nucleic acids are NOT DETECTED. The SARS-CoV-2 RNA is generally detectable in upper respiratoy specimens during the acute phase of infection. The lowest concentration of SARS-CoV-2 viral copies this assay can detect is 131 copies/mL. A negative result does not preclude SARS-Cov-2 infection and should not be used as the sole basis for treatment or other patient management decisions. A negative result may occur with  improper specimen collection/handling, submission of specimen other than nasopharyngeal swab, presence of viral mutation(s) within the areas targeted by this assay, and inadequate number of viral copies (<131 copies/mL). A negative result must be combined with clinical observations, patient history, and epidemiological information. The expected result is Negative. Fact Sheet for Patients:  PinkCheek.be Fact Sheet for Healthcare Providers:   GravelBags.it This test is not yet ap proved or cleared by the [place] FDA and  has been authorized for detection and/or diagnosis of SARS-CoV-2 by FDA under an Emergency Use Authorization (EUA). This EUA will remain  in effect (meaning this test can be used) for the duration of the COVID-19 declaration under Section 564(b)(1) of the Act, 21 U.S.C. section 360bbb-3(b)(1), unless the authorization is terminated or revoked sooner.    Influenza A by PCR NEGATIVE NEGATIVE Final   Influenza B by PCR NEGATIVE NEGATIVE Final    Comment: (NOTE) The Xpert Xpress SARS-CoV-2/FLU/RSV assay is intended as an aid in  the diagnosis of influenza from Nasopharyngeal swab specimens and  should not be used as a sole basis for treatment. Nasal washings and  aspirates are unacceptable for Xpert Xpress SARS-CoV-2/FLU/RSV  testing. Fact Sheet for Patients: PinkCheek.be Fact Sheet for Healthcare Providers: GravelBags.it This test is not yet approved or cleared by the [place] FDA and  has been authorized for detection and/or diagnosis of SARS-CoV-2 by  FDA under an Emergency Use Authorization (EUA). This EUA will remain  in effect (meaning this test can be used) for the duration of the  Covid-19 declaration under Section 564(b)(1) of the Act, 21  U.S.C. section 360bbb-3(b)(1), unless the authorization is  terminated or revoked. Performed at [facility], [address] [place], [place] [address]   MRSA PCR Screening     Status: None   Collection Time: 03/03/19  7:48 PM   Specimen: Nasopharyngeal  Result Value Ref Range Status   MRSA by PCR NEGATIVE NEGATIVE Final    Comment:        The GeneXpert MRSA Assay (FDA approved for NASAL specimens only), is one component of a comprehensive MRSA colonization surveillance program. It is not intended to diagnose MRSA infection nor to guide or monitor  treatment for MRSA infections. Performed at [facility], [address] [place], [place] [address]   CULTURE, BLOOD (ROUTINE X 2) w Reflex to ID Panel     Status: None   Collection Time: 03/05/19 10:16 AM   Specimen: BLOOD  Result Value Ref Range Status   Specimen Description BLOOD LEFT ANTECUBITAL  Final   Special Requests   Final    BOTTLES DRAWN AEROBIC AND ANAEROBIC Blood Culture adequate volume   Culture  Final    NO GROWTH 5 DAYS Performed at [facility], [address] [place], [place] [address]    Report Status 03/10/2019 FINAL  Final  CULTURE, BLOOD (ROUTINE X 2) w Reflex to ID Panel     Status: None   Collection Time: 03/05/19 10:22 AM   Specimen: BLOOD  Result Value Ref Range Status   Specimen Description BLOOD BLOOD LEFT HAND  Final   Special Requests   Final    BOTTLES DRAWN AEROBIC ONLY Blood Culture adequate volume   Culture   Final    NO GROWTH 5 DAYS Performed at [facility], [address] [place], [place] [address]    Report Status 03/10/2019 FINAL  Final  MRSA PCR Screening     Status: None   Collection Time: 03/15/19  9:08 AM   Specimen: Nasopharyngeal  Result Value Ref Range Status   MRSA by PCR NEGATIVE NEGATIVE Final    Comment:        The GeneXpert MRSA Assay (FDA approved for NASAL specimens only), is one component of a comprehensive MRSA colonization surveillance program. It is not intended to diagnose MRSA infection nor to guide or monitor treatment for MRSA infections. Performed at [facility], [address] [place], [place] [address]   Body fluid culture     Status: None   Collection Time: 03/15/19 11:00 AM   Specimen: PATH Cytology Pleural fluid  Result Value Ref Range Status   Specimen Description   Final    PLEURAL Performed at [facility], [address] [place], [place] [address]    Special Requests   Final    NONE Performed at [facility], [address] [place], [place] [address]    Gram Stain   Final    NO WBC SEEN NO ORGANISMS SEEN Performed at [facility], [place] [address], [place], [place] [address]    Culture NO GROWTH  Final   Report Status 03/18/2019 FINAL  Final    Coagulation Studies: No results for input(s): LABPROT, INR in the last 72 hours.  Urinalysis: Recent Labs    03/17/19 1940  COLORURINE YELLOW*  LABSPEC 1.017  PHURINE 5.0  GLUCOSEU 50*  HGBUR NEGATIVE  BILIRUBINUR NEGATIVE  KETONESUR 5*  PROTEINUR 100*  NITRITE NEGATIVE  LEUKOCYTESUR NEGATIVE      Imaging: No results found.   Medications:   . piperacillin-tazobactam (ZOSYN)  IV 3.375 g (03/17/19 2159)   . sodium chloride  250 mL Intravenous Once  . amiodarone  200 mg Oral BID   Followed by  . [START ON 03/19/2019] amiodarone  200 mg Oral Daily  . apixaban  5 mg Oral BID  . aspirin EC  81 mg Oral Daily  . chlorhexidine  15 mL Mouth Rinse BID  . Chlorhexidine Gluconate Cloth  6 each Topical Q0600  . dextromethorphan-guaiFENesin  1 tablet Oral BID  . dronabinol  5 mg Oral BID AC  . DULoxetine  30 mg Oral Daily  . ferrous sulfate  325 mg Oral BID WC  . folic acid  1 mg Oral Daily  . insulin aspart  0-5 Units Subcutaneous QHS  . insulin aspart  0-9 Units Subcutaneous TID WC  . insulin detemir  20 Units Subcutaneous QHS  . ipratropium-albuterol  3 mL Nebulization TID  . mouth rinse  15 mL Mouth Rinse q12n4p  . metoprolol succinate  12.5 mg Oral Daily  . multivitamin  1 tablet Oral QHS  .  multivitamin with minerals  1 tablet Oral Daily  . nicotine  21 mg Transdermal Daily  . oxymetazoline  1 spray Each Nare BID  . polyethylene glycol  17 g Oral Daily  . Ensure Max Protein  11 oz Oral BID BM  . rosuvastatin  10 mg Oral Daily  . sodium bicarbonate  1,300 mg Oral BID  . sodium chloride flush  10-40 mL Intracatheter Q12H  . thiamine  100 mg Oral Daily   acetaminophen, albuterol, bisacodyl, dextrose, heparin lock flush, heparin lock  flush, HYDROmorphone (DILAUDID) injection, menthol-cetylpyridinium, ondansetron (ZOFRAN) IV, ondansetron (ZOFRAN) IV, pneumococcal 23 valent vaccine, simethicone, sodium chloride, sodium chloride flush  Assessment/ Plan:  [name] is a 64 y.o. white male with hypertension, hyperlipidemia, diabetes, depression, anxiety, adenocarcinoma of the lung stage IV, coronary disease, history of CABG, tobacco and h/o alcohol abuse, left pleural effusion, admitted on 03/03/2019 for DKA (diabetic ketoacidoses) ([place]) [E11.10] New onset atrial fibrillation ([place]) [I48.91] Atrial fibrillation with rapid ventricular response ([place]) [I48.91] Diabetic ketoacidosis without coma associated with other specified diabetes mellitus ([place]) [E13.10]   1. Acute renal failure with metabolic acidosis: requiring renal replacement therapy. Last dialysis was 3/12 Baseline creatinine of 0.7, GFR > 60 on 02/08/19.  Most likely with underlying diabetic nephropathy.  Nonoliguric urine output. No acute indication for dialysis today.  - Dialysis for tomorrow. Keep on TTS schedule.  - discontinue sodium bicarbonate.   2. Diabetes mellitus type II : hemoglobin A1c of 11.3% on 03/04/19. Poor control.  Holding metformin.   4. Anemia with renal failure: hemoglobin 8.3, normocytic.  - EPO with HD treatment.   5. Hypertension: with atrial fibrillation and coronary artery disease.  Appreciate cardiology input.  - metoprolol.   6. Pneumonia: status post right thoracentesis on 3/12 with 2.7 liters removed.  - pip/tazo.    LOS: Story 3/15/202110:48 AM

## 2019-03-18 NOTE — Progress Notes (Signed)
Post HD  

## 2019-03-18 NOTE — Progress Notes (Signed)
Physical Therapy Treatment Patient Details Name: Craig Koch MRN: [id number] DOB: 09-Jul-1955 Today's Date: 03/18/2019    History of Present Illness Pt is 64 y.o. male with hypertension, hyperlipidemia, diabetes, depression, anxiety, adenocarcinoma of the lung stage IV, coronary disease, history of CABG, tobacco and h/o alcohol abuse, left pleural effusion, admitted on 03/03/2019 for DKA. Pt hospital stay complicated by new onset afib with RVR and non-ST elevated MI. Pt has also had a thoracentesis during stay. Pt underwent perm cath placement. Temp cath still in place, per nephrology okay to mobilize patient (planning to remove).    PT Comments    Pt alert, family at bedside, nephrology MD at bedside. Pt oriented x4, reported some pain in RUE, and then endorsed R sided headache pain at end of session, RN notified. Pt seen as re-evaluation with OT for pt and therapist safety and to maximize mobility.  The patient demonstrated bed mobility with very light physical assist to complete weight shift. Sitting EOB pt endorsed dizziness, vitals WFLs, symptoms decreased with time but did not resolve. Sit <> stand with RW and minAx2 during session. First attempt to assess BP, pt fatigued and needed seated rest break. Then pt able to stand pivot to recliner with cueing for RW management and minAx2. Pt up in chair, all needs in reach. The patient would benefit from continued skilled PT intervention to return to PLOF as able. Overall the patient demonstrated improvement and progression towards goals, but recommendation of SNF remains appropriate due to continued weakness and level of assistance needed for mobility.     Follow Up Recommendations  SNF;Supervision/Assistance - 24 hour     Equipment Recommendations  Rolling walker with 5" wheels    Recommendations for Other Services       Precautions / Restrictions Precautions Precautions: Fall Restrictions Weight Bearing Restrictions: No     Mobility  Bed Mobility Overal bed mobility: Needs Assistance Bed Mobility: Supine to Sit     Supine to sit: Min assist     General bed mobility comments: pt able to initiate all movements and demonstrated improvement since last session, supervision to initiate, and very light minA to assist with weight shift towards EOB.  Transfers Overall transfer level: Needs assistance Equipment used: Rolling walker (2 wheeled) Transfers: Sit to/from [facility] Sit to Stand: Min assist;+2 physical assistance;+2 safety/equipment Stand pivot transfers: Min assist;+2 physical assistance;+2 safety/equipment       General transfer comment: sit to stand first attempt to assess BP, pt fatigued and needed seated rest break. Then pt able to stand pivot to recliner with cueing for RW management and minAx2  Ambulation/Gait                 Stairs             Wheelchair Mobility    Modified Rankin (Stroke Patients Only)       Balance Overall balance assessment: Needs assistance Sitting-balance support: Feet supported;Single extremity supported Sitting balance-Leahy Scale: Fair Sitting balance - Comments: Pt with dizziness upon sitting, pt stated the dizziness decreased with time but did not completely resolve. Orthostatics assessed, WFLs.     Standing balance-Leahy Scale: Poor Standing balance comment: reliant on RW for balance in static standing                            Cognition Arousal/Alertness: Awake/alert Behavior During Therapy: WFL for tasks assessed/performed Overall Cognitive Status: Within Functional Limits for  tasks assessed                                        Exercises Other Exercises Other Exercises: Family and pt educated on importance of continued mobility, as well as talked through/instructed in LE therapeutic exercises. (heel/toe raises, LAQ, heel slides/hip abduction inbed) Other Exercises: pt performed  seated exercises with supervision and visual cues; LAQx10 bilaterally, heel/toe raises x10    General Comments        Pertinent Vitals/Pain Pain Assessment: Faces Faces Pain Scale: Hurts a little bit Pain Location: Pt reported some R arm pain, and then R sided headache pain after mobilization. Pain Descriptors / Indicators: Grimacing Pain Intervention(s): Repositioned;Monitored during session(RN notified)    Home Living                      Prior Function            PT Goals (current goals can now be found in the care plan section) Progress towards PT goals: Progressing toward goals    Frequency    Min 2X/week      PT Plan Current plan remains appropriate    Co-evaluation PT/OT/SLP Co-Evaluation/Treatment: Yes Reason for Co-Treatment: Necessary to address cognition/behavior during functional activity;For patient/therapist safety;To address functional/ADL transfers PT goals addressed during session: Mobility/safety with mobility;Strengthening/ROM OT goals addressed during session: ADL's and self-care;Strengthening/ROM;Proper use of Adaptive equipment and DME      AM-PAC PT "6 Clicks" Mobility   Outcome Measure  Help needed turning from your back to your side while in a flat bed without using bedrails?: A Little Help needed moving from lying on your back to sitting on the side of a flat bed without using bedrails?: A Little Help needed moving to and from a bed to a chair (including a wheelchair)?: A Lot Help needed standing up from a chair using your arms (e.g., wheelchair or bedside chair)?: A Lot Help needed to walk in hospital room?: A Lot Help needed climbing 3-5 steps with a railing? : Total 6 Click Score: 13    End of Session Equipment Utilized During Treatment: Gait belt Activity Tolerance: Patient tolerated treatment well Patient left: with chair alarm set;in chair;with family/visitor present;with call bell/phone within reach;with nursing/sitter in  room(chair alarm pad placed under pt, no green box in room, RN informed) Nurse Communication: Mobility status PT Visit Diagnosis: Muscle weakness (generalized) (M62.81);Difficulty in walking, not elsewhere classified (R26.2);Unsteadiness on feet (R26.81)     Time: 9794-8016 PT Time Calculation (min) (ACUTE ONLY): 29 min  Charges:  $Therapeutic Exercise: 8-22 mins                     Lieutenant Diego PT, DPT 12:25 PM,03/18/19

## 2019-03-18 NOTE — Progress Notes (Signed)
Right femoral cath removed, pt tolerated well.  gauze and transparent dressing  intact, no bleeding noted.

## 2019-03-18 NOTE — Progress Notes (Signed)
Patient accepted at [facility] MWF 2pm. Can start on Wednesday 3/17. Patient's wife is going to clinic today at 1pm to sign consents.

## 2019-03-18 NOTE — Progress Notes (Signed)
HD started. 

## 2019-03-18 NOTE — TOC Progression Note (Signed)
Transition of Care [facility]) - Progression Note    Patient Details  Name: Craig Koch MRN: [id number] Date of Birth: 1955/02/06  Transition of Care [facility]) CM/SW [place], LCSW Phone Number: 03/18/2019, 1:07 PM  Clinical Narrative:   Josem Kaufmann started for [facility]. COVID test pending.    Expected Discharge Plan: Home/Self Care Barriers to Discharge: Continued Medical Work up  Expected Discharge Plan and Services Expected Discharge Plan: Home/Self Care                                               Social Determinants of Health (SDOH) Interventions    Readmission Risk Interventions Readmission Risk Prevention Plan 03/06/2019  Transportation Screening Complete  PCP or Specialist Appt within 3-5 Days Complete  Medication Review (RN Care Manager) Complete  Some recent data might be hidden

## 2019-03-18 NOTE — Care Management Important Message (Signed)
Important Message  Patient Details  Name: Craig Koch MRN: [id number] Date of Birth: September 29, 1955   Medicare Important Message Given:  Yes     [name] 03/18/2019, 12:30 PM

## 2019-03-18 NOTE — Progress Notes (Signed)
Progress Note  Patient Name: Craig Koch Date of Encounter: 03/18/2019  Primary Cardiologist: Dr. Rockey Situ  Subjective   No acute events overnight.  No chest pain or shortness of breath.  He is maintaining in sinus rhythm.  Inpatient Medications    Scheduled Meds: . sodium chloride  250 mL Intravenous Once  . amiodarone  200 mg Oral BID   Followed by  . [START ON 03/19/2019] amiodarone  200 mg Oral Daily  . apixaban  5 mg Oral BID  . aspirin EC  81 mg Oral Daily  . chlorhexidine  15 mL Mouth Rinse BID  . Chlorhexidine Gluconate Cloth  6 each Topical Q0600  . dextromethorphan-guaiFENesin  1 tablet Oral BID  . dronabinol  5 mg Oral BID AC  . DULoxetine  30 mg Oral Daily  . ferrous sulfate  325 mg Oral BID WC  . folic acid  1 mg Oral Daily  . insulin aspart  0-5 Units Subcutaneous QHS  . insulin aspart  0-9 Units Subcutaneous TID WC  . insulin detemir  20 Units Subcutaneous QHS  . ipratropium-albuterol  3 mL Nebulization TID  . mouth rinse  15 mL Mouth Rinse q12n4p  . metoprolol succinate  12.5 mg Oral Daily  . multivitamin  1 tablet Oral QHS  . multivitamin with minerals  1 tablet Oral Daily  . nicotine  21 mg Transdermal Daily  . oxymetazoline  1 spray Each Nare BID  . polyethylene glycol  17 g Oral Daily  . Ensure Max Protein  11 oz Oral BID BM  . rosuvastatin  10 mg Oral Daily  . sodium bicarbonate  1,300 mg Oral BID  . sodium chloride flush  10-40 mL Intracatheter Q12H  . thiamine  100 mg Oral Daily   Continuous Infusions: . piperacillin-tazobactam (ZOSYN)  IV 3.375 g (03/17/19 2159)   PRN Meds: acetaminophen, albuterol, dextrose, heparin lock flush, heparin lock flush, HYDROmorphone (DILAUDID) injection, menthol-cetylpyridinium, ondansetron (ZOFRAN) IV, ondansetron (ZOFRAN) IV, pneumococcal 23 valent vaccine, sodium chloride, sodium chloride flush   Vital Signs    Vitals:   03/17/19 2013 03/17/19 2057 03/18/19 0403 03/18/19 0722  BP: 113/67  114/60 117/61    Pulse: 76  77 75  Resp: 20  20 18   Temp: 98 F (36.7 C)  98 F (36.7 C) (!) 97.4 F (36.3 C)  TempSrc: Oral  Oral Oral  SpO2: 95% 93% 97% 96%  Weight:   90.9 kg   Height:        Intake/Output Summary (Last 24 hours) at 03/18/2019 0847 Last data filed at 03/18/2019 0727 Gross per 24 hour  Intake 120 ml  Output 250 ml  Net -130 ml   Last 3 Weights 03/18/2019 03/16/2019 03/15/2019  Weight (lbs) 200 lb 6.4 oz 193 lb 11.2 oz (No Data)  Weight (kg) 90.901 kg 87.862 kg (No Data)      Telemetry    Sinus rhythm, heart rate 70- Personally Reviewed  ECG    New tracing obtained- Personally Reviewed  Physical Exam   GEN:  No acute distress Neck: No JVD Cardiac: RRR, no murmurs, rubs, or gallops.  Respiratory:  Decreased breath sounds at bases. GI: Soft, nontender, non-distended  MS: No edema; No deformity. Neuro:  Nonfocal  Psych: Somnolent, not able to arouse patient  Labs    High Sensitivity Troponin:   Recent Labs  Lab 03/03/19 1547 03/03/19 1750 03/14/19 1917 03/14/19 2058  TROPONINIHS 13,356* >27,000* 398* 390*  Chemistry Recent Labs  Lab 03/16/19 0432 03/16/19 0927 03/17/19 0440 03/18/19 0541  NA 135  --  136 135  K 4.2  --  3.6 3.8  CL 98  --  97* 96*  CO2 29  --  29 29  GLUCOSE 251*  --  154* 144*  BUN 19  --  28* 37*  CREATININE 2.49*  --  3.09* 3.70*  CALCIUM 7.7*  --  8.1* 8.0*  PROT  --  5.7*  --   --   GFRNONAA 26*  --  20* 16*  GFRAA 31*  --  23* 19*  ANIONGAP 8  --  10 10     Hematology Recent Labs  Lab 03/16/19 0432 03/17/19 0440 03/18/19 0541  WBC 5.3 6.7 7.2  RBC 2.91* 3.07* 2.98*  HGB 8.2* 8.8* 8.3*  HCT 25.4* 26.9* 26.1*  MCV 87.3 87.6 87.6  MCH 28.2 28.7 27.9  MCHC 32.3 32.7 31.8  RDW 18.8* 19.0* 18.7*  PLT 223 226 215    BNP No results for input(s): BNP, PROBNP in the last 168 hours.   DDimer No results for input(s): DDIMER in the last 168 hours.   Radiology    No results found.  Cardiac Studies    Echo 1. Left ventricular ejection fraction, by estimation, is 30 to 35%. The  left ventricle has moderately decreased function. The left ventricle  demonstrates global hypokinesis. Left ventricular diastolic parameters are  indeterminate.  2. Right ventricular systolic function is normal. The right ventricular  size is normal. Tricuspid regurgitation signal is inadequate for assessing  PA pressure.  3. Left atrial size was mild to moderately dilated.  4. The mitral valve is normal in structure and function. Moderate mitral  valve regurgitation. No evidence of mitral stenosis.  5. The aortic valve is normal in structure and function. Aortic valve  regurgitation is trivial. Mild to moderate aortic valve  sclerosis/calcification is present, without any evidence of aortic  stenosis.  6. The inferior vena cava is normal in size with greater than 50%  respiratory variability, suggesting right atrial pressure of 3 mmHg.   Patient Profile     64 y.o. male with history of CAD/CABG x4, diabetes, hypertension, stage IV lung cancer, malignant pleural effusions presenting with shortness of breath, found to have atrial fibrillation with rapid ventricular response and non-ST elevated MI.  Assessment & Plan    1.  Paroxysmal A. fib with RVR -Currently in sinus rhythm -Continue p.o. amiodarone 200 twice daily followed by 200 mg once daily. -Continue Eliquis 5 mg twice daily.  2.  NSTEMI, history of CAD/CABG -Not a candidate for ischemic work-up given his comorbidities and current condition -Continue PTA aspirin, Crestor, beta-blocker  3.  Chronic systolic heart failure with an ejection fraction of 35% likely due to underlying ischemic cardiomyopathy.  Continue small dose Toprol.  Not able to uptitrate due to low blood pressure.  Not on ACE inhibitor or ARB due to low blood pressure and renal failure.  4.  Renal dysfunction -Hemodialysis as per nephrology.     Signed, Kathlyn Sacramento,  MD  03/18/2019, 8:47 AM

## 2019-03-18 NOTE — Progress Notes (Signed)
Pre HD  

## 2019-03-18 NOTE — Progress Notes (Signed)
PROGRESS NOTE    Craig Koch  QMG:[id number] DOB: 1955-06-26 DOA: 03/03/2019  PCP: Tonia Ghent, MD    LOS - 15   Brief Narrative:  64 year old male with hypertension, hyperlipidemia, type 2 diabetes mellitus, anxiety depression, stage IV lung cancer, CAD with history of CABG, tobacco and alcohol use who presented with generalized weakness and shortness of breath. In the ED he was found to be in DKA, AKI with creatinine of 1.6, new onset A. fib with RVR. Required BiPAP and admission to stepdown unit. Hospital course prolonged due to persistent encephalopathy, rapid A. fib, and ATN. Increased work of breathing on 3/11 with CXR showing acute pulmonary edema, given IV Lasix and dialyzed without much improvement. ABG normal, never dropped oxygen sats. No fevers or leukocytosis. Dry chest CT showed a new mod/large right pleural effusion and new LLL infiltrate. Started on Vanc/Zosyn. Repeat echo pending. US guided thoracentesisdone on 3/12 with 2.7L fluid removed.  Subjective 3/15: Patient seen and examined this morning.  Wife arrived at bedside.  Patient reports feeling overall weak but otherwise okay.  Reports has not had shortness of breath recur since thoracentesis.  Denies chest pain, fevers, chills, nausea vomiting diarrhea or other acute complaints.  We discussed should be able to discharge him to SNF later today or tomorrow, since his outpatient dialysis has been arranged.  Assessment & Plan:   Principal Problem:   Atrial fibrillation with RVR (HCC) Active Problems:   HLD (hyperlipidemia)   Essential hypertension   History of alcohol use   CAD (coronary artery disease), native coronary artery   lung adenocarcinoma   Tobacco abuse   Uncontrolled type 2 diabetes mellitus with hyperglycemia (HCC)   DKA (diabetic ketoacidoses) (HCC)   Hypothermia   SOB (shortness of breath)   Acute renal failure (HCC)   NSTEMI (non-ST elevated myocardial infarction) (HCC)   Acute  alteration in mental status   Palliative care encounter   Protein-calorie malnutrition, severe   Acute systolic heart failure (HCC)   Pain of right upper extremity   Microcytic anemia   AKI (acute kidney injury) ([place])   Pleural effusion   Acute Respiratory Failure with Hypoxiadue to Acute Decompensation of HFrEF/volume overload Left Lower Lobe Pneumonia- new 03/14/19, possibly HCAP Moderate to Large Right Pleural Effusion- seen on CT chest 3/11, likely cause of acute respiratory distress that started early AM of 3/11. CXR with pulmonary edema, placed on bipap. Given 120 mg IV Lasix per nephrology. Echo of 03/04/19 showed EF 30-35% with moderate MR.No wheezing on exam to suggest bronchospasm. Chest x-ray personally reviewed and no appreciable infiltrates seen, appears consistent with pulmonary edema.ABG normal.Noncontrast CT chest shows new consolidation in LLL and moderate to largeright-sidedpleural effusion with compressive atelectasis. Repeated echo and this was unchanged from previous. --continue Zosyn -last day --Supplemental oxygen to maintain O2 sat greater than 90% --BiPAP as neededduring sleep, has not needed --limit volume as much as possible  Right Pleural Effusion- likely the cause of patient's respiratory distress, in addition to LLL infiltrate. Patient's prior malignant effusions were left-sided (lung cancer also on left), loculated, required chest tube at home for some time.Underwent ultrasound-guided thoracentesis on 3/12 with 2.7 L fluid removed. None of lights criteria were met, consistent with transudative of effusion. --Monitor for recurrence --Monitor respiratory status  AKI secondary toAcuteTubularNecrosis Baseline Cr 0.7. Secondary to hypovolemia. Now on intermittent hemodialysis since 3/8. Evaluation thus far: normalPTH, MPO/PR-3 antibody, GBM, complements. --nephrology following --Temporary dialysis catheter to be removed today --TOC  following for outpatient dialysis set up --f/u SPEP, UPEP --dialysis per nephro  New onset atrial fibrillation with RVR- paroxysmal Has been in sinus rhythm on telemetry. --Cardiology following --amiodarone200 mg PO BIDx 7doses, then 200 mgdaily starting 3/16 --transitioned off heparin gtt to Elquis 3/11 --continue low dose metoprolol  Stage IV lung adenocarcinoma Dr. Tasia Catchings following him in the hospital. Recommend to hold dabrafenib and trametinib. --Palliative care consulted.  --Patient being [facility] to [facility] for palliative care to follow after discharge.  Acute metabolic encephalopathy- resolved Possibly related to alcohol use. Antibiotic discontinued.  Acute systolic CHF (EF 83-41%) NSTEMI Presented with markedly elevated troponin and suspect NSTEMI with respiratory failure and DKA. --Cardiology following --continue beta blocker, statin --Per cardiology, consider Plavix on discharge and discontinue aspirin --further ischemic evaluation pending resolution of acute renal failure --monitor volume status closely: strict I/O's, daily weights  Uncontrolled type II diabetes mellitus with DKA DKA resolved on insulin drip. Patient became hypoglycemic on 3/5 possibly due to increased dose of Lantus and worsened renal function. Off insulin and being monitored on sliding scale only. CBG stable.  Alcohol abuse No withdrawal symptoms.  Tobacco abuse Nicotine patch.  Iron deficiency anemia Noted for drop in H&H. PRBC transfusion planned for 3/10 with HD.  Hypothermia Secondary to DKA. Resolved   DVT prophylaxis:On Eliquis Code Status: DNR Family Communication: None at bedside during encounter  Disposition Plan:DC to SNF pending further improvement and clearance by consultants. Coming Fromhome Exp DC Date3/15 Barriersas above Medically Stable for Discharge?No  Consultants:  Nephrology  Cardiology  Vascular  surgery  Palliative Care  Oncology  Procedures:  Dialysis  PermCath placed on 03/14/19  Right thoracentesis on 03/15/19 with 2.7 L pleural fluid removed  Antimicrobials:  Zosyn   Objective: Vitals:   03/18/19 1530 03/18/19 1545 03/18/19 1600 03/18/19 1615  BP: (!) 117/50 (!) 111/51 (!) 113/50 (!) 109/52  Pulse: 66 65 64 64  Resp: _0 Temp:      TempSrc:      SpO2: 100% 100% 98% 98%  Weight:      Height:        Intake/Output Summary (Last 24 hours) at 03/18/2019 1649 Last data filed at 03/18/2019 1600 Gross per 24 hour  Intake 240 ml  Output 1750 ml  Net -1510 ml   Filed Weights   03/16/19 0424 03/18/19 0403  Weight: 87.9 kg 90.9 kg    Examination:  General exam: awake, alert, no acute distress, chronically ill-appearing HEENT: moist mucus membranes, hearing grossly normal  Respiratory system: CTAB but diminished at the right base, no wheezes, rales or rhonchi, normal respiratory effort. Cardiovascular system: normal S1/S2, RRR, no pedal edema.   Central nervous system: A&O x4. no gross focal neurologic deficits, normal speech Extremities: moves all, no cyanosis, normal tone Skin: dry, intact, normal temperature Psychiatry: normal mood, congruent affect, judgement and insight appear normal    Data Reviewed: I have personally reviewed following labs and imaging studies  CBC: Recent Labs  Lab 03/15/19 0554 03/16/19 0432 03/17/19 0440 03/18/19 0541 03/18/19 1256  WBC 7.0 5.3 6.7 7.2 7.1  NEUTROABS  --   --   --  5.7  --   HGB 9.3* 8.2* 8.8* 8.3* 8.6*  HCT 29.6* 25.4* 26.9* 26.1* 26.8*  MCV 87.6 87.3 87.6 87.6 88.2  PLT 238 223 226 215 962   Basic Metabolic Panel: Recent Labs  Lab 03/12/19 1012 03/13/19 0417 03/14/19 0520 03/14/19 1133 03/15/19 0554 03/16/19 0432 03/17/19 0440 03/18/19  0541  NA  --    < > 135  --  135 135 136 135  K  --    < > 4.5  --  3.8 4.2 3.6 3.8  CL  --    < > 96*  --  96* 98 97* 96*  CO2  --    < > 24   --  _0 GLUCOSE  --    < > 265*  --  159* 251* 154* 144*  BUN  --    < > 33*  --  25* 19 28* 37*  CREATININE  --    < > 3.17*  --  2.81* 2.49* 3.09* 3.70*  CALCIUM  --    < > 8.0*  --  8.0* 7.7* 8.1* 8.0*  PHOS 4.7*  --   --  2.8  --   --   --   --    < > = values in this interval not displayed.   GFR: Estimated Creatinine Clearance: 22.1 mL/min (A) (by C-G formula based on SCr of 3.7 mg/dL (H)). Liver Function Tests: Recent Labs  Lab 03/16/19 0927  PROT 5.7*   No results for input(s): LIPASE, AMYLASE in the last 168 hours. No results for input(s): AMMONIA in the last 168 hours. Coagulation Profile: No results for input(s): INR, PROTIME in the last 168 hours. Cardiac Enzymes: No results for input(s): CKTOTAL, CKMB, CKMBINDEX, TROPONINI in the last 168 hours. BNP (last 3 results) No results for input(s): PROBNP in the last 8760 hours. HbA1C: No results for input(s): HGBA1C in the last 72 hours. CBG: Recent Labs  Lab 03/17/19 1123 03/17/19 1648 03/17/19 2012 03/18/19 0725 03/18/19 1133  GLUCAP 151* 213* 224* 95 103*   Lipid Profile: No results for input(s): CHOL, HDL, LDLCALC, TRIG, CHOLHDL, LDLDIRECT in the last 72 hours. Thyroid Function Tests: No results for input(s): TSH, T4TOTAL, FREET4, T3FREE, THYROIDAB in the last 72 hours. Anemia Panel: No results for input(s): VITAMINB12, FOLATE, FERRITIN, TIBC, IRON, RETICCTPCT in the last 72 hours. Sepsis Labs: No results for input(s): PROCALCITON, LATICACIDVEN in the last 168 hours.  Recent Results (from the past 240 hour(s))  MRSA PCR Screening     Status: None   Collection Time: 03/15/19  9:08 AM   Specimen: Nasopharyngeal  Result Value Ref Range Status   MRSA by PCR NEGATIVE NEGATIVE Final    Comment:        The GeneXpert MRSA Assay (FDA approved for NASAL specimens only), is one component of a comprehensive MRSA colonization surveillance program. It is not intended to diagnose MRSA infection nor to  guide or monitor treatment for MRSA infections. Performed at [facility], [address] [place], [place] [address]   Body fluid culture     Status: None   Collection Time: 03/15/19 11:00 AM   Specimen: PATH Cytology Pleural fluid  Result Value Ref Range Status   Specimen Description   Final    PLEURAL Performed at [facility], [address] [place], [place] [address]    Special Requests   Final    NONE Performed at [facility], [address] [place], [place] [address]    Gram Stain   Final    NO WBC SEEN NO ORGANISMS SEEN Performed at [facility], [place] [address], [place], [place] [address]    Culture NO GROWTH  Final   Report Status 03/18/2019 FINAL  Final  Radiology Studies: No results found.      Scheduled Meds: . sodium chloride  250 mL Intravenous Once  . [START ON 03/19/2019] amiodarone  200 mg Oral Daily  . apixaban  5 mg Oral BID  . aspirin EC  81 mg Oral Daily  . chlorhexidine  15 mL Mouth Rinse BID  . Chlorhexidine Gluconate Cloth  6 each Topical Q0600  . dextromethorphan-guaiFENesin  1 tablet Oral BID  . dronabinol  5 mg Oral BID AC  . DULoxetine  30 mg Oral Daily  . ferrous sulfate  325 mg Oral BID WC  . folic acid  1 mg Oral Daily  . insulin aspart  0-5 Units Subcutaneous QHS  . insulin aspart  0-9 Units Subcutaneous TID WC  . insulin detemir  20 Units Subcutaneous QHS  . ipratropium-albuterol  3 mL Nebulization TID  . mouth rinse  15 mL Mouth Rinse q12n4p  . metoprolol succinate  12.5 mg Oral Daily  . multivitamin  1 tablet Oral QHS  . multivitamin with minerals  1 tablet Oral Daily  . nicotine  21 mg Transdermal Daily  . oxymetazoline  1 spray Each Nare BID  . polyethylene glycol  17 g Oral Daily  . Ensure Max Protein  11 oz Oral BID BM  . rosuvastatin  10 mg Oral Daily  . sodium chloride flush  10-40 mL Intracatheter Q12H  . thiamine  100 mg Oral Daily   Continuous Infusions: .  piperacillin-tazobactam (ZOSYN)  IV 3.375 g (03/18/19 1058)     LOS: 15 days    Time spent: 50 minutes    Ezekiel Slocumb, DO [facility]   If 7PM-7AM, please contact night-coverage [email or web address] 03/18/2019, 4:49 PM

## 2019-03-18 NOTE — Progress Notes (Signed)
Nutrition Follow Up Note   DOCUMENTATION CODES:   Severe malnutrition in context of chronic illness  INTERVENTION:   Provide Ensure Max Protein po BID, each supplement provides 150 kcal and 30 grams of protein.  MVI daily   Rena-vite daily   Liberalize diet   NUTRITION DIAGNOSIS:   Severe Malnutrition related to chronic illness(stage IV lung adenocarcinoma, CHF, EtOH abuse) as evidenced by 11.9% weight loss over 5 months, severe fat depletion, moderate-severe muscle depletion.  GOAL:   Patient will meet greater than or equal to 90% of their needs  -progressing   MONITOR:   PO intake, Supplement acceptance, Labs, Weight trends, I & O's  ASSESSMENT:   64 year old male with PMHx of CAD s/p CABG 2004, HTN, DM, HLD, anxiety, arthritis, stage IV lung adenocarcinoma admitted with new onset A-fib with RVR, acute tubular necrosis, acute metabolic encephalopathy, NSTEMI, acute systolic CHF, DKA, EtOH abuse.   Pt continues to have fair appetite and oral intake in hospital; pt eating 40-80% of his meals today and is drinking some Ensure Max protein supplements. Recommend continue supplements and MVI. RD will also liberalize pt's diet as a renal diet is highly restrictive and pt is not eating enough to exceed nutrient limits. Per chart, pt initially up ~23lbs after admit but pt's weight is no trending back down. Renal function declined requiring HD; pt s/p treatment today. Rena-vite added to replace losses from HD.  Palliative care following.   Medications reviewed and include: aspirin, Marinol, ferrous sulfate, insulin, rena-vite,  MVI, nicotine, miralax, thiamine, zosyn   Labs reviewed: K 3.8 wnl, BUN 37(H), creat 3.70(H) P 2.8 wnl- 3/11 Hgb 8.6(L), Hct 26.8(L) cbgs- 95, 103, 126 x 24 hrs AIC 11.3(H)- 3/1  Diet Order:   Diet Order            Diet renal/carb modified with fluid restriction Diet-HS Snack? Nothing; Fluid restriction: 1200 mL Fluid; Room service appropriate? Yes; Fluid  consistency: Thin  Diet effective now             EDUCATION NEEDS:   No education needs have been identified at this time  Skin:  Skin Assessment: Reviewed RN Assessment  Last BM:  3/11- type 6  Height:   Ht Readings from Last 1 Encounters:  03/14/19 6' (1.829 m)   Weight:   Wt Readings from Last 1 Encounters:  03/18/19 90.9 kg   Ideal Body Weight:  80.9 kg  BMI:  Body mass index is 27.18 kg/m.  Estimated Nutritional Needs:   Kcal:  6295-2841  Protein:  100 grams  Fluid:  per MD  Koleen Distance MS, RD, LDN Contact information available in Amion

## 2019-03-18 NOTE — Progress Notes (Signed)
HD tx ended 

## 2019-03-18 NOTE — Progress Notes (Signed)
Post Hd

## 2019-03-19 ENCOUNTER — Telehealth: Payer: Self-pay | Admitting: Cardiovascular Disease

## 2019-03-19 DIAGNOSIS — Z951 Presence of aortocoronary bypass graft: Secondary | ICD-10-CM | POA: Diagnosis not present

## 2019-03-19 DIAGNOSIS — Z9889 Other specified postprocedural states: Secondary | ICD-10-CM

## 2019-03-19 DIAGNOSIS — J90 Pleural effusion, not elsewhere classified: Secondary | ICD-10-CM | POA: Diagnosis not present

## 2019-03-19 DIAGNOSIS — R0902 Hypoxemia: Secondary | ICD-10-CM | POA: Diagnosis not present

## 2019-03-19 DIAGNOSIS — Z794 Long term (current) use of insulin: Secondary | ICD-10-CM | POA: Diagnosis not present

## 2019-03-19 DIAGNOSIS — E785 Hyperlipidemia, unspecified: Secondary | ICD-10-CM | POA: Diagnosis not present

## 2019-03-19 DIAGNOSIS — I5022 Chronic systolic (congestive) heart failure: Secondary | ICD-10-CM | POA: Diagnosis not present

## 2019-03-19 DIAGNOSIS — Z743 Need for continuous supervision: Secondary | ICD-10-CM | POA: Diagnosis not present

## 2019-03-19 DIAGNOSIS — R2681 Unsteadiness on feet: Secondary | ICD-10-CM | POA: Diagnosis not present

## 2019-03-19 DIAGNOSIS — J189 Pneumonia, unspecified organism: Secondary | ICD-10-CM | POA: Diagnosis not present

## 2019-03-19 DIAGNOSIS — N170 Acute kidney failure with tubular necrosis: Secondary | ICD-10-CM | POA: Diagnosis not present

## 2019-03-19 DIAGNOSIS — I5021 Acute systolic (congestive) heart failure: Secondary | ICD-10-CM | POA: Diagnosis not present

## 2019-03-19 DIAGNOSIS — D649 Anemia, unspecified: Secondary | ICD-10-CM | POA: Diagnosis not present

## 2019-03-19 DIAGNOSIS — Z20822 Contact with and (suspected) exposure to covid-19: Secondary | ICD-10-CM | POA: Diagnosis not present

## 2019-03-19 DIAGNOSIS — R0609 Other forms of dyspnea: Secondary | ICD-10-CM | POA: Diagnosis not present

## 2019-03-19 DIAGNOSIS — I5043 Acute on chronic combined systolic (congestive) and diastolic (congestive) heart failure: Secondary | ICD-10-CM | POA: Diagnosis not present

## 2019-03-19 DIAGNOSIS — Z7401 Bed confinement status: Secondary | ICD-10-CM | POA: Diagnosis not present

## 2019-03-19 DIAGNOSIS — R7989 Other specified abnormal findings of blood chemistry: Secondary | ICD-10-CM | POA: Diagnosis not present

## 2019-03-19 DIAGNOSIS — C3490 Malignant neoplasm of unspecified part of unspecified bronchus or lung: Secondary | ICD-10-CM | POA: Diagnosis not present

## 2019-03-19 DIAGNOSIS — M6259 Muscle wasting and atrophy, not elsewhere classified, multiple sites: Secondary | ICD-10-CM | POA: Diagnosis not present

## 2019-03-19 DIAGNOSIS — N184 Chronic kidney disease, stage 4 (severe): Secondary | ICD-10-CM | POA: Diagnosis not present

## 2019-03-19 DIAGNOSIS — Z992 Dependence on renal dialysis: Secondary | ICD-10-CM | POA: Diagnosis not present

## 2019-03-19 DIAGNOSIS — N179 Acute kidney failure, unspecified: Secondary | ICD-10-CM | POA: Diagnosis not present

## 2019-03-19 DIAGNOSIS — F411 Generalized anxiety disorder: Secondary | ICD-10-CM | POA: Diagnosis present

## 2019-03-19 DIAGNOSIS — F329 Major depressive disorder, single episode, unspecified: Secondary | ICD-10-CM | POA: Diagnosis present

## 2019-03-19 DIAGNOSIS — E876 Hypokalemia: Secondary | ICD-10-CM | POA: Diagnosis not present

## 2019-03-19 DIAGNOSIS — D519 Vitamin B12 deficiency anemia, unspecified: Secondary | ICD-10-CM | POA: Diagnosis not present

## 2019-03-19 DIAGNOSIS — R0602 Shortness of breath: Secondary | ICD-10-CM | POA: Diagnosis not present

## 2019-03-19 DIAGNOSIS — I2510 Atherosclerotic heart disease of native coronary artery without angina pectoris: Secondary | ICD-10-CM | POA: Diagnosis not present

## 2019-03-19 DIAGNOSIS — I10 Essential (primary) hypertension: Secondary | ICD-10-CM | POA: Diagnosis not present

## 2019-03-19 DIAGNOSIS — E43 Unspecified severe protein-calorie malnutrition: Secondary | ICD-10-CM | POA: Diagnosis not present

## 2019-03-19 DIAGNOSIS — Z66 Do not resuscitate: Secondary | ICD-10-CM | POA: Diagnosis not present

## 2019-03-19 DIAGNOSIS — R531 Weakness: Secondary | ICD-10-CM | POA: Diagnosis not present

## 2019-03-19 DIAGNOSIS — D509 Iron deficiency anemia, unspecified: Secondary | ICD-10-CM | POA: Diagnosis not present

## 2019-03-19 DIAGNOSIS — D631 Anemia in chronic kidney disease: Secondary | ICD-10-CM | POA: Diagnosis not present

## 2019-03-19 DIAGNOSIS — E119 Type 2 diabetes mellitus without complications: Secondary | ICD-10-CM | POA: Diagnosis not present

## 2019-03-19 DIAGNOSIS — I4891 Unspecified atrial fibrillation: Secondary | ICD-10-CM | POA: Diagnosis not present

## 2019-03-19 DIAGNOSIS — I5023 Acute on chronic systolic (congestive) heart failure: Secondary | ICD-10-CM | POA: Diagnosis not present

## 2019-03-19 DIAGNOSIS — I252 Old myocardial infarction: Secondary | ICD-10-CM | POA: Diagnosis not present

## 2019-03-19 DIAGNOSIS — I214 Non-ST elevation (NSTEMI) myocardial infarction: Secondary | ICD-10-CM | POA: Diagnosis not present

## 2019-03-19 DIAGNOSIS — E1165 Type 2 diabetes mellitus with hyperglycemia: Secondary | ICD-10-CM | POA: Diagnosis not present

## 2019-03-19 DIAGNOSIS — F17210 Nicotine dependence, cigarettes, uncomplicated: Secondary | ICD-10-CM | POA: Diagnosis present

## 2019-03-19 DIAGNOSIS — I132 Hypertensive heart and chronic kidney disease with heart failure and with stage 5 chronic kidney disease, or end stage renal disease: Secondary | ICD-10-CM | POA: Diagnosis not present

## 2019-03-19 DIAGNOSIS — N189 Chronic kidney disease, unspecified: Secondary | ICD-10-CM | POA: Diagnosis not present

## 2019-03-19 DIAGNOSIS — M6281 Muscle weakness (generalized): Secondary | ICD-10-CM | POA: Diagnosis not present

## 2019-03-19 DIAGNOSIS — E1110 Type 2 diabetes mellitus with ketoacidosis without coma: Secondary | ICD-10-CM | POA: Diagnosis not present

## 2019-03-19 DIAGNOSIS — J9601 Acute respiratory failure with hypoxia: Secondary | ICD-10-CM | POA: Diagnosis not present

## 2019-03-19 DIAGNOSIS — M2550 Pain in unspecified joint: Secondary | ICD-10-CM | POA: Diagnosis not present

## 2019-03-19 DIAGNOSIS — M6389 Disorders of muscle in diseases classified elsewhere, multiple sites: Secondary | ICD-10-CM | POA: Diagnosis not present

## 2019-03-19 DIAGNOSIS — I480 Paroxysmal atrial fibrillation: Secondary | ICD-10-CM | POA: Diagnosis not present

## 2019-03-19 DIAGNOSIS — N186 End stage renal disease: Secondary | ICD-10-CM | POA: Diagnosis not present

## 2019-03-19 DIAGNOSIS — N2581 Secondary hyperparathyroidism of renal origin: Secondary | ICD-10-CM | POA: Diagnosis not present

## 2019-03-19 DIAGNOSIS — E1122 Type 2 diabetes mellitus with diabetic chronic kidney disease: Secondary | ICD-10-CM | POA: Diagnosis not present

## 2019-03-19 LAB — GLUCOSE, CAPILLARY
Glucose-Capillary: 120 mg/dL — ABNORMAL HIGH (ref 70–99)
Glucose-Capillary: 165 mg/dL — ABNORMAL HIGH (ref 70–99)
Glucose-Capillary: 209 mg/dL — ABNORMAL HIGH (ref 70–99)
Glucose-Capillary: 200 mg/dL — ABNORMAL HIGH (ref 70–99)

## 2019-03-19 LAB — RESPIRATORY PANEL BY RT PCR (FLU A&B, COVID)
Influenza A by PCR: NEGATIVE
Influenza B by PCR: NEGATIVE
SARS Coronavirus 2 by RT PCR: NEGATIVE

## 2019-03-19 LAB — BASIC METABOLIC PANEL
Anion gap: 9 (ref 5–15)
BUN: 26 mg/dL — ABNORMAL HIGH (ref 8–23)
CO2: 29 mmol/L (ref 22–32)
Calcium: 8 mg/dL — ABNORMAL LOW (ref 8.9–10.3)
Chloride: 100 mmol/L (ref 98–111)
Creatinine, Ser: 2.45 mg/dL — ABNORMAL HIGH (ref 0.61–1.24)
GFR calc Af Amer: 31 mL/min — ABNORMAL LOW (ref >60)
GFR calc non Af Amer: 27 mL/min — ABNORMAL LOW (ref >60)
Glucose, Bld: 206 mg/dL — ABNORMAL HIGH (ref 70–99)
Potassium: 3.6 mmol/L (ref 3.5–5.1)
Sodium: 138 mmol/L (ref 135–145)

## 2019-03-19 MED ORDER — ASPIRIN 81 MG PO TBEC: 81.0000 mg | DELAYED_RELEASE_TABLET | Freq: Every day | ORAL | Status: DC

## 2019-03-19 MED ORDER — ENSURE MAX PROTEIN PO LIQD: 11.0000 [oz_av] | Freq: Two times a day (BID) | ORAL | Status: DC

## 2019-03-19 MED ORDER — SIMETHICONE 80 MG PO CHEW: 80.0000 mg | CHEWABLE_TABLET | Freq: Four times a day (QID) | ORAL | 0 refills | Status: DC | PRN

## 2019-03-19 MED ORDER — POLYETHYLENE GLYCOL 3350 17 G PO PACK: 17.0000 g | PACK | Freq: Every day | ORAL | 0 refills | Status: DC

## 2019-03-19 MED ORDER — INSULIN ASPART 100 UNIT/ML ~~LOC~~ SOLN: 0.0000 [IU] | Freq: Three times a day (TID) | SUBCUTANEOUS | Status: DC

## 2019-03-19 MED ORDER — INSULIN DETEMIR 100 UNIT/ML ~~LOC~~ SOLN: 20.0000 [IU] | Freq: Every day | SUBCUTANEOUS | 11 refills | Status: DC

## 2019-03-19 MED ORDER — AMIODARONE HCL 200 MG PO TABS: 200.0000 mg | ORAL_TABLET | Freq: Every day | ORAL | Status: DC

## 2019-03-19 MED ORDER — FOLIC ACID 1 MG PO TABS: 1.0000 mg | ORAL_TABLET | Freq: Every day | ORAL | Status: DC

## 2019-03-19 MED ORDER — SALINE SPRAY 0.65 % NA SOLN: 1.0000 | NASAL | 0 refills | Status: DC | PRN

## 2019-03-19 MED ORDER — NICOTINE 21 MG/24HR TD PT24: 21.0000 mg | MEDICATED_PATCH | Freq: Every day | TRANSDERMAL | 0 refills | Status: DC

## 2019-03-19 MED ORDER — POTASSIUM CHLORIDE CRYS ER 20 MEQ PO TBCR
40.0000 meq | EXTENDED_RELEASE_TABLET | Freq: Once | ORAL | Status: AC
  Administered 2019-03-19: 40 meq via ORAL
  Filled 2019-03-19: qty 2

## 2019-03-19 MED ORDER — DM-GUAIFENESIN ER 30-600 MG PO TB12: 1.0000 | ORAL_TABLET | Freq: Two times a day (BID) | ORAL | Status: DC

## 2019-03-19 MED ORDER — APIXABAN 5 MG PO TABS: 5.0000 mg | ORAL_TABLET | Freq: Two times a day (BID) | ORAL | Status: DC

## 2019-03-19 MED ORDER — FERROUS SULFATE 325 (65 FE) MG PO TABS: 325.0000 mg | ORAL_TABLET | Freq: Two times a day (BID) | ORAL | 3 refills | Status: DC

## 2019-03-19 MED ORDER — ACETAMINOPHEN 325 MG PO TABS: 650.0000 mg | ORAL_TABLET | Freq: Four times a day (QID) | ORAL | Status: DC | PRN

## 2019-03-19 MED ORDER — RENA-VITE PO TABS: 1.0000 | ORAL_TABLET | Freq: Every day | ORAL | 0 refills | Status: AC

## 2019-03-19 MED ORDER — THIAMINE HCL 100 MG PO TABS: 100.0000 mg | ORAL_TABLET | Freq: Every day | ORAL | Status: DC

## 2019-03-19 MED ORDER — METOPROLOL SUCCINATE ER 25 MG PO TB24: 12.5000 mg | ORAL_TABLET | Freq: Every day | ORAL | Status: DC

## 2019-03-19 NOTE — Progress Notes (Signed)
CH received secure chat from SW to help pt. and wife complete AD in anticipation of discg to SNF later today; pt. in bed, awake and oriented; wife @ bedside.  CH explained AD and assisted pt. in completing document. PT. DOES NOT WANT ANY HEROIC MEASURES, but is interested in comfort care.  Document witnessed, notarized, scanned into Vynca and physical copy placed in chart.  CH offered prayer for pt.'s swift rehabilitation before excusing himself.  Pt. and wife grateful for [facility] visit.    03/19/19 1600  Clinical Encounter Type  Visited With Patient and family together  Visit Type Follow-up;Social support;Other (Comment) (Advanced Directive Education/Completion)  Advance Directives (For Healthcare)  Does Patient Have a Medical Advance Directive? Yes  Does patient want to make changes to medical advance directive? Yes (Inpatient - patient requests chaplain consult to change a medical advance directive)  Type of Advance Directive [place];Living will  Copy of [place] in Chart? Yes - validated most recent copy scanned in chart (See row information)  Copy of Living Will in Chart? Yes - validated most recent copy scanned in chart (See row information)  Would patient like information on creating a medical advance directive? No - Patient declined

## 2019-03-19 NOTE — TOC Transition Note (Signed)
Transition of Care [facility]) - CM/SW Discharge Note   Patient Details  Name: Craig Koch MRN: [id number] Date of Birth: 06/24/1955  Transition of Care [facility] CM/SW Contact:  Eileen Stanford, LCSW Phone Number: 03/19/2019, 2:03 PM   Clinical Narrative:  Clinical Social Worker facilitated patient discharge including contacting patient family and facility to confirm patient discharge plans.  Clinical information faxed to facility and family agreeable with plan.  CSW arranged ambulance transport via [facility] to [facility]  RN to call [phone] for report prior to discharge.   Final next level of care: Skilled Nursing Facility Barriers to Discharge: No Barriers Identified   Patient Goals and CMS Choice        Discharge Placement              Patient chooses bed at: [facility] Patient to be transferred to facility by: [facility] Name of family member notified: [name] Patient and family notified of of transfer: 03/19/19  Discharge Plan and Services                                     Social Determinants of Health (SDOH) Interventions     Readmission Risk Interventions Readmission Risk Prevention Plan 03/06/2019  Transportation Screening Complete  PCP or Specialist Appt within 3-5 Days Complete  Medication Review (RN Care Manager) Complete  Some recent data might be hidden

## 2019-03-19 NOTE — Progress Notes (Signed)
Attempted to call report to [facility] place. Transferred to a line with no answer. Will attempt again once EMS arrives.

## 2019-03-19 NOTE — TOC Progression Note (Signed)
Transition of Care [facility]) - Progression Note    Patient Details  Name: Craig Koch MRN: [id number] Date of Birth: 07-Oct-1955  Transition of Care [facility]) CM/SW Contact  Eileen Stanford, LCSW Phone Number: 03/19/2019, 1:50 PM  Clinical Narrative: CSW received authorization for pt to admit to [facility] today.  3 days, 3/17, review date, level 2, reference: [id number], CM Darnelle Bos [phone] (f). Information provided to facility.    Expected Discharge Plan: Home/Self Care Barriers to Discharge: Continued Medical Work up  Expected Discharge Plan and Services Expected Discharge Plan: Home/Self Care         Expected Discharge Date: 03/19/19                                     Social Determinants of Health (SDOH) Interventions    Readmission Risk Interventions Readmission Risk Prevention Plan 03/06/2019  Transportation Screening Complete  PCP or Specialist Appt within 3-5 Days Complete  Medication Review [employer]) Complete  Some recent data might be hidden

## 2019-03-19 NOTE — Progress Notes (Signed)
Progress Note  Patient Name: Craig Koch Date of Encounter: 03/19/2019  Primary Cardiologist: Esmond Plants, MD PhD  Subjective   No chest pain, shortness of breath, or edema.  Temporary dialysis catheter was removed yesterday and tunneled right IJ HD catheter placed.  Patient tolerated HD well.  Very weak.  Inpatient Medications    Scheduled Meds: . sodium chloride  250 mL Intravenous Once  . amiodarone  200 mg Oral Daily  . apixaban  5 mg Oral BID  . aspirin EC  81 mg Oral Daily  . chlorhexidine  15 mL Mouth Rinse BID  . Chlorhexidine Gluconate Cloth  6 each Topical Q0600  . dextromethorphan-guaiFENesin  1 tablet Oral BID  . dronabinol  5 mg Oral BID AC  . DULoxetine  30 mg Oral Daily  . ferrous sulfate  325 mg Oral BID WC  . folic acid  1 mg Oral Daily  . insulin aspart  0-5 Units Subcutaneous QHS  . insulin aspart  0-9 Units Subcutaneous TID WC  . insulin detemir  20 Units Subcutaneous QHS  . ipratropium-albuterol  3 mL Nebulization TID  . mouth rinse  15 mL Mouth Rinse q12n4p  . metoprolol succinate  12.5 mg Oral Daily  . multivitamin  1 tablet Oral QHS  . multivitamin with minerals  1 tablet Oral Daily  . nicotine  21 mg Transdermal Daily  . oxymetazoline  1 spray Each Nare BID  . polyethylene glycol  17 g Oral Daily  . Ensure Max Protein  11 oz Oral BID BM  . rosuvastatin  10 mg Oral Daily  . sodium chloride flush  10-40 mL Intracatheter Q12H  . thiamine  100 mg Oral Daily   Continuous Infusions: . piperacillin-tazobactam (ZOSYN)  IV 3.375 g (03/18/19 2228)   PRN Meds: acetaminophen, albuterol, bisacodyl, dextrose, heparin lock flush, heparin lock flush, HYDROmorphone (DILAUDID) injection, menthol-cetylpyridinium, ondansetron (ZOFRAN) IV, ondansetron (ZOFRAN) IV, pneumococcal 23 valent vaccine, simethicone, sodium chloride, sodium chloride flush   Vital Signs    Vitals:   03/18/19 1656 03/18/19 1936 03/18/19 1955 03/19/19 0422  BP: 124/61 (!) 100/55   117/61  Pulse: 75 68  69  Resp: 18     Temp: 97.8 F (36.6 C) (!) 97.5 F (36.4 C)  98 F (36.7 C)  TempSrc: Oral Oral  Oral  SpO2: 100% 98% 97% 98%  Weight:    86.2 kg  Height:        Intake/Output Summary (Last 24 hours) at 03/19/2019 0749 Last data filed at 03/19/2019 0500 Gross per 24 hour  Intake 530 ml  Output 1800 ml  Net -1270 ml   Last 3 Weights 03/19/2019 03/18/2019 03/18/2019  Weight (lbs) 190 lb (No Data) (No Data)  Weight (kg) 86.183 kg (No Data) (No Data)      Telemetry    NSR with PVC's - Personally Reviewed  ECG    No new tracing.  Physical Exam   GEN: No acute distress.   Neck: No JVD Cardiac: RRR, no murmurs, rubs, or gallops.  Respiratory: Bilateral rhonchi and faint crackles at the left base. GI: Soft, nontender, non-distended  MS: No edema; No deformity. Neuro:  Nonfocal  Psych: Normal affect   Labs    High Sensitivity Troponin:   Recent Labs  Lab 03/03/19 1547 03/03/19 1750 03/14/19 1917 03/14/19 2058  TROPONINIHS 13,356* >27,000* 398* 390*      Chemistry Recent Labs  Lab 03/16/19 8413 03/16/19 2440 03/17/19 0440 03/18/19 0541 03/19/19 0320  NA   < >  --  136 135 138  K   < >  --  3.6 3.8 3.6  CL   < >  --  97* 96* 100  CO2   < >  --  29 29 29   GLUCOSE   < >  --  154* 144* 206*  BUN   < >  --  28* 37* 26*  CREATININE   < >  --  3.09* 3.70* 2.45*  CALCIUM   < >  --  8.1* 8.0* 8.0*  PROT  --  5.7*  --   --   --   GFRNONAA   < >  --  20* 16* 27*  GFRAA   < >  --  23* 19* 31*  ANIONGAP   < >  --  10 10 9    < > = values in this interval not displayed.     Hematology Recent Labs  Lab 03/17/19 0440 03/18/19 0541 03/18/19 1256  WBC 6.7 7.2 7.1  RBC 3.07* 2.98* 3.04*  HGB 8.8* 8.3* 8.6*  HCT 26.9* 26.1* 26.8*  MCV 87.6 87.6 88.2  MCH 28.7 27.9 28.3  MCHC 32.7 31.8 32.1  RDW 19.0* 18.7* 18.9*  PLT 226 215 237    BNPNo results for input(s): BNP, PROBNP in the last 168 hours.   DDimer No results for input(s):  DDIMER in the last 168 hours.   Radiology    No results found.  Cardiac Studies   TTE (03/15/19): 1. Left ventricular ejection fraction, by estimation, is 35 to 40%. The  left ventricle has moderately decreased function. The left ventricle  demonstrates regional wall motion abnormalities . The left ventricular  internal cavity size was mildly dilated.  There is mild left ventricular hypertrophy. Left ventricular diastolic  parameters are consistent with Grade II diastolic dysfunction  (pseudonormalization). There is moderate hypokinesis of the left  ventricular, entire inferior wall.  2. Right ventricular systolic function is normal. The right ventricular  size is normal. There is moderately elevated pulmonary artery systolic  pressure. The estimated right ventricular systolic pressure is 64.6 mmHg.  3. Left atrial size was mildly dilated.  4. The mitral valve is normal in structure. Moderate mitral valve  regurgitation. No evidence of mitral stenosis.  5. The aortic valve is abnormal. Aortic valve regurgitation is mild. Mild  to moderate aortic valve sclerosis/calcification is present, without any  evidence of aortic stenosis.  6. The inferior vena cava is normal in size with greater than 50%  respiratory variability, suggesting right atrial pressure of 3 mmHg.   Patient Profile     64 y.o. 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  Assessment & Plan    Paroxysmal atrial fibrillation: Patient is maintaining sinus rhythm on amiodarone.  He is also on apixaban without significant bleeding; mild epistaxis noted.  Transition to amiodarone 200 mg daily.  Continue apixaban 5 mg BID.  NSTEMI: No symptoms of ongoing myocardial ischemia.  Patient is not a candidate for cath/PCI due to his  comorbidities (AKI, stage IV lung CA).  Continue ASA 81 mg daily as long as there is no significant bleeding, as he is also on apixaban.  Continue rosuvastatin and metoprolol succinate.  Chronic HFrEF: Patient appears euvolemic on exam today.  He is very weak and  would benefit from further rehab.  Continue metoprolol succinate 12.5 mg daily.  Defer addition of ACEI/ARB/aldosterone antagonist in the setting of soft blood pressure and acute kidney injury.  Acute kidney injury: Patient remains on hemodialysis with plans for continued HD after discharge.  Per nephrology.  Anemia: Hemoglobin has been relatively stable without evidence of active bleeding.  I suspect anemia is due to multiple factors, including acute on chronic illness.  We will need to monitor for bleeding given ongoing anticoagulation with apixaban and aspirin.  [facility] will sign off.   Medication Recommendations:  Amiodarone, apixaban, aspirin, metoprolol, and rosuvastatin, as above. Other recommendations (labs, testing, etc):  None Follow up as an outpatient:  ~2 weeks with Dr. Rockey Situ or APP (appointment to be arranged by our office).  For questions or updates, please contact [place] Please consult [email or web address] for contact info under [facility].     Signed, Nelva Bush, MD  03/19/2019, 7:49 AM

## 2019-03-19 NOTE — Discharge Summary (Addendum)
Physician Discharge Summary  Craig Koch TDV:[id number] DOB: 1955/06/13 DOA: 03/03/2019  PCP: Craig Ghent, MD  Admit date: 03/03/2019 Discharge date: 03/19/2019  Admitted From: Home Disposition:  [place]  Recommendations for Outpatient Follow-up:  Follow up with PCP in 1-2 weeks Please obtain BMP/CBC in one week Attend dialysis appointments as scheduled on Monday/Wednesday/Friday's Follow up with Dr. Rockey Situ with cardiology in 2 weeks (appointment will be arranged by their office) Follow up with oncology next week regarding immunotherapy for lung cancer [facility] to follow patient for palliative care in outpatient setting  Home Health: No  Equipment/Devices: None   Discharge Condition: Stable  CODE STATUS: DNR  Diet recommendation: Heart Healthy / Carb Modified   Brief/Interim Summary:  64 year old male with hypertension, hyperlipidemia, type 2 diabetes mellitus, anxiety depression, stage IV lung cancer, CAD with history of CABG, tobacco and alcohol use who presented with generalized weakness and shortness of breath.  In the ED he was found to be in DKA, AKI with creatinine of 1.6, new onset A. fib with RVR.  Required BiPAP and admission to stepdown unit.  Hospital course prolonged due to persistent encephalopathy, rapid A. fib, and ATN.  Increased work of breathing on 3/11 with CXR showing acute pulmonary edema, given IV Lasix and dialyzed without much improvement.  ABG normal, never dropped oxygen sats.  No fevers or leukocytosis.  Dry chest CT showed a new mod/large right pleural effusion and new LLL infiltrate.  Started on antibiotics. Craig Koch guided thoracentesis done on 3/12 with 2.7L fluid removed.  Acute Respiratory Failure with Hypoxia due to Acute Decompensation of HFrEF /volume overload Left Lower Lobe Pneumonia - new 03/14/19, possibly HCAP Moderate to Large Right Pleural Effusion - seen on CT chest 3/11, likely cause of acute respiratory distress that started early AM  of 3/11.  CXR with pulmonary edema, placed on bipap and given Lasix per nephrology.  Echo of 03/04/19 showed EF 30-35% with moderate MR, repeat echo unchanged.  Noncontrast CT chest shows new consolidation in LLL and moderate to large right-sided pleural effusion with compressive atelectasis.  Completed course of Zosyn.  Stable respiratory status with no hypoxia.   Right Pleural Effusion - likely the cause of patient's respiratory distress, in addition to LLL infiltrate. Patient's prior malignant effusions were left-sided (lung cancer also on left), loculated, required chest tube at home for some time.  Underwent ultrasound-guided thoracentesis on 3/12 with 2.7 L fluid removed. None of lights criteria were met, consistent with transudative of effusion. --Monitor respiratory status   AKI secondary to Acute Tubular Necrosis Baseline Cr 0.7. Secondary to hypovolemia.  Now on intermittent hemodialysis since 3/8. Evaluation thus far: normal PTH, MPO/PR-3 antibody, GBM, complements. --outpatient dialysis at [facility] M/W/F's --follow up with nephrology   New onset atrial fibrillation with RVR - paroxysmal Has been in sinus rhythm on telemetry.   --Cardiology following --discharge on amiodarone 200 mg daily --continue Eliquis --continue low dose metoprolol   Stage IV lung adenocarcinoma Dr. Tasia Koch following him in the hospital.  Recommend to hold dabrafenib and trametinib. --Palliative care consulted.   --[facility] for [facility] care to follow after discharge. --oral chemo meds stopped due to renal failure --follow up with Dr. Tasia Koch next week regarding immunotherapy   Acute metabolic encephalopathy - resolved Possibly related to alcohol use.  Antibiotic discontinued.   Acute systolic CHF (EF 06-26%) NSTEMI Presented with markedly elevated troponin and suspect NSTEMI with respiratory failure and DKA. --Cardiology following --continue beta blocker, statin --  Per cardiology,  consider Plavix on discharge and discontinue aspirin --further ischemic evaluation pending resolution of acute renal failure --monitor volume status closely: strict I/O's, daily weights   Uncontrolled type II diabetes mellitus with DKA DKA resolved on insulin drip.  Patient became hypoglycemic on 3/5 possibly due to increased dose of Lantus and worsened renal function.  Off insulin and being monitored on sliding scale only.  CBG stable.   Alcohol abuse - No withdrawal symptoms. Tobacco abuse - Nicotine patch. Iron deficiency anemia - Noted for  drop in H&H.  PRBC transfusion planned for 3/10 with HD. Hypothermia - Secondary to DKA.  Resolved  Discharge Diagnoses: Principal Problem:   Atrial fibrillation with RVR ([place]) Active Problems:   HLD (hyperlipidemia)   Essential hypertension   History of alcohol use   CAD (coronary artery disease), native coronary artery   lung adenocarcinoma   Tobacco abuse   Uncontrolled type 2 diabetes mellitus with hyperglycemia (HCC)   DKA (diabetic ketoacidoses) (HCC)   Hypothermia   SOB (shortness of breath)   Acute renal failure (HCC)   NSTEMI (non-ST elevated myocardial infarction) (HCC)   Acute alteration in mental status   Palliative care encounter   Protein-calorie malnutrition, severe   Acute systolic heart failure (HCC)   Pain of right upper extremity   Microcytic anemia   AKI (acute kidney injury) ([place])   Pleural effusion   S/P thoracentesis    Discharge Instructions   Discharge Instructions     (HEART FAILURE PATIENTS) Call MD:  Anytime you have any of the following symptoms: 1) 3 pound weight gain in 24 hours or 5 pounds in 1 week 2) shortness of breath, with or without a dry hacking cough 3) swelling in the hands, feet or stomach 4) if you have to sleep on extra pillows at night in order to breathe.   Complete by: As directed    Call MD for:  extreme fatigue   Complete by: As directed    Call MD for:  persistant dizziness or  light-headedness   Complete by: As directed    Call MD for:  severe uncontrolled pain   Complete by: As directed    Call MD for:  temperature >100.4   Complete by: As directed    Care order/instruction   Complete by: As directed    Transfuse Parameters   Complete patient signature process for consent form   Complete by: As directed    Diet - low sodium heart healthy   Complete by: As directed    Increase activity slowly   Complete by: As directed    Practitioner attestation of consent   Complete by: As directed    I, the ordering practitioner, attest that I have discussed with the patient the benefits, risks, side effects, alternatives, likelihood of achieving goals and potential problems during recovery for the procedure listed.   Procedure: Blood Product(s)   Type and screen   Complete by: As directed    [place] as of 03/19/2019       Reactions   Lipitor [atorvastatin Calcium] Other (See Comments)   Aches.  Tolerated crestor.         Medication List     STOP taking these medications    dabrafenib mesylate 75 MG capsule Commonly known as: TAFINLAR   insulin lispro 100 UNIT/ML injection Commonly known as: HumaLOG   metFORMIN 500 MG tablet Commonly known as: GLUCOPHAGE  metoprolol tartrate 100 MG tablet Commonly known as: LOPRESSOR   trametinib dimethyl sulfoxide 2 MG tablet Commonly known as: MEKINIST       TAKE these medications    acetaminophen 325 MG tablet Commonly known as: TYLENOL Take 2 tablets (650 mg total) by mouth every 6 (six) hours as needed for mild pain or fever.   albuterol 108 (90 Base) MCG/ACT inhaler Commonly known as: VENTOLIN HFA Inhale 2 puffs into the lungs every 6 (six) hours as needed for wheezing or shortness of breath.   amiodarone 200 MG tablet Commonly known as: PACERONE Take 1 tablet (200 mg total) by mouth daily. Start taking on: March 20, 2019   apixaban 5 MG Tabs  tablet Commonly known as: ELIQUIS Take 1 tablet (5 mg total) by mouth 2 (two) times daily.   aspirin 81 MG EC tablet Take 1 tablet (81 mg total) by mouth daily. Start taking on: March 20, 2019   BD Insulin Syringe U/F 31G X 5/16" 0.5 ML Misc Generic drug: Insulin Syringe-Needle U-100 USE DAILY AS DIRECTED. DX E11.9   dextromethorphan-guaiFENesin 30-600 MG 12hr tablet Commonly known as: MUCINEX DM Take 1 tablet by mouth 2 (two) times daily.   dronabinol 5 MG capsule Commonly known as: MARINOL Take 1 capsule (5 mg total) by mouth 2 (two) times daily before a meal.   DULoxetine 30 MG capsule Commonly known as: CYMBALTA TAKE 1 CAPSULE BY MOUTH EVERY DAY What changed: how much to take   Ensure Max Protein Liqd Take 330 mLs (11 oz total) by mouth 2 (two) times daily between meals.   ferrous sulfate 325 (65 FE) MG tablet Take 1 tablet (325 mg total) by mouth 2 (two) times daily with a meal.   folic acid 1 MG tablet Commonly known as: FOLVITE Take 1 tablet (1 mg total) by mouth daily. Start taking on: March 20, 2019   glucose blood test strip Commonly known as: ONE TOUCH ULTRA TEST USE TO TEST GLUCOSE LEVELS 4 (FOUR) TIMES DAILY.   insulin aspart 100 UNIT/ML injection Commonly known as: novoLOG Inject 0-9 Units into the skin 3 (three) times daily with meals. Take 0 units if CBG 70-120 Take 1 unit if CBG 121-150 Take 2 units if CBG 151-200 Take 3 units if CBG 201-250 Take 5 units if CBG 251-300 Take 7 units if CBG 301-350 Take 9 units if CBG 351-400 If CBG > 400, give 12 units and call MD   insulin detemir 100 UNIT/ML injection Commonly known as: LEVEMIR Inject 0.2 mLs (20 Units total) into the skin at bedtime. What changed:  how much to take additional instructions   metoprolol succinate 25 MG 24 hr tablet Commonly known as: TOPROL-XL Take 0.5 tablets (12.5 mg total) by mouth daily. Start taking on: March 20, 2019   multivitamin Tabs tablet Take 1 tablet by  mouth at bedtime.   nicotine 21 mg/24hr patch Commonly known as: NICODERM CQ - dosed in mg/24 hours Place 1 patch (21 mg total) onto the skin daily. Start taking on: March 20, 2019   ondansetron 8 MG tablet Commonly known as: ZOFRAN Take 1 tablet (8 mg total) by mouth every 8 (eight) hours as needed for nausea or vomiting.   polyethylene glycol 17 g packet Commonly known as: MIRALAX / GLYCOLAX Take 17 g by mouth daily. Start taking on: March 20, 2019   rosuvastatin 10 MG tablet Commonly known as: CRESTOR TAKE 1 TABLET (10 MG TOTAL) BY MOUTH DAILY.   simethicone  80 MG chewable tablet Commonly known as: MYLICON Chew 1 tablet (80 mg total) by mouth 4 (four) times daily as needed for flatulence.   sodium chloride 0.65 % Soln nasal spray Commonly known as: OCEAN Place 1 spray into both nostrils as needed for congestion.   thiamine 100 MG tablet Take 1 tablet (100 mg total) by mouth daily. Start taking on: March 20, 2019   triamcinolone cream 0.1 % Commonly known as: KENALOG Apply 1 application topically 2 (two) times daily.        Contact information for follow-up providers     Craig Ghent, MD. Schedule an appointment as soon as possible for a visit.   Specialty: Family Medicine Why: Please make appointment with PCP for 5-7 days following discharge.  Contact information: [place] [id number] [phone]         [place] Follow up on 03/26/2019.   Specialty: Cardiology Why: at 1:30pm. Enter through the [facility] entrance Contact information: [place] [phone]        Minna Merritts, MD. Go on 04/01/2019.   Specialty: Cardiology Why: Appointment at 11:20am Contact information: [place] [address] [phone]              Contact information for after-discharge care      Destination     [facility] .   Service: Skilled Nursing Contact information: [address] [place]                    Allergies  Allergen Reactions   Lipitor [Atorvastatin Calcium] Other (See Comments)    Aches.  Tolerated crestor.     Consultations: Cardiology Nephrology Oncology  Palliative Care Pulmonology   Procedures/Studies: DG Chest 1 View  Result Date: 03/14/2019 CLINICAL DATA:  Wheezing EXAM: CHEST  1 VIEW COMPARISON:  03/03/2019 FINDINGS: Borderline heart size.  CABG. Diffuse interstitial opacity with Kerley lines and left more than right pleural effusion. No pneumothorax. IMPRESSION: CHF. Electronically Signed   By: Monte Fantasia M.D.   On: 03/14/2019 05:47   CT HEAD WO CONTRAST  Result Date: 03/05/2019 CLINICAL DATA:  Metastatic disease evaluation.  Lung cancer. EXAM: CT HEAD WITHOUT CONTRAST TECHNIQUE: Contiguous axial images were obtained from the base of the skull through the vertex without intravenous contrast. COMPARISON:  MRI head 09/20/2018 FINDINGS: Brain: Mild atrophy. Mild hypodensity in the white matter unchanged from the prior MRI. No acute infarct, hemorrhage, mass. No edema or midline shift. Vascular: Negative for hyperdense vessel Skull: Negative skull. Sinuses/Orbits: Mucosal edema and bony thickening right maxillary sinus. Chronic nasal bone fracture. Left cataract extraction. Other: None IMPRESSION: No acute abnormality and negative for metastatic disease on unenhanced CT. Atrophy and mild chronic microvascular ischemia. Electronically Signed   By: Franchot Gallo M.D.   On: 03/05/2019 15:28   CT CHEST WO CONTRAST  Result Date: 03/14/2019 CLINICAL DATA:  Respiratory failure. EXAM: CT CHEST WITHOUT CONTRAST TECHNIQUE: Multidetector CT imaging of the chest was performed following the standard protocol without IV contrast. COMPARISON:  Chest CT dated 01/22/2019. FINDINGS:  Cardiovascular: Heart size appears stable. No pericardial effusion. Diffuse coronary artery calcifications. Surgical changes of median sternotomy for presumed CABG. Mediastinum/Nodes: No mass or enlarged lymph nodes seen within the mediastinum. Esophagus appears normal. Trachea is unremarkable. Lungs/Pleura: New RIGHT pleural effusion, moderate to  large in size. Associated compressive atelectasis within the RIGHT perihilar and lower lobe. New ill-defined consolidation within the LEFT lower lobe, suspected pneumonia. Probable additional rounded atelectasis within the lingula. Diffuse interstitial thickening indicating some degree of volume overload versus atypical infection. Upper Abdomen: Limited images of the upper abdomen are unremarkable. Musculoskeletal: No acute or suspicious osseous finding. IMPRESSION: 1. New ill-defined consolidation within the LEFT lower lobe, suspected pneumonia. 2. New moderate to large-sized RIGHT pleural effusion, with associated compressive atelectasis. 3. Diffuse interstitial thickening, most likely edema/fluid overload, less likely additional atypical infection. 4. Probable additional rounded atelectasis within the lingula. Aortic Atherosclerosis (ICD10-I70.0). Electronically Signed   By: Franki Cabot M.D.   On: 03/14/2019 19:47   Craig Koch RENAL  Result Date: 03/10/2019 CLINICAL DATA:  65 year old presenting with acute renal failure. Evaluate for obstruction as a possible cause. EXAM: RENAL / URINARY TRACT ULTRASOUND COMPLETE COMPARISON:  Urinary tract ultrasound 03/04/2019. CT abdomen and pelvis 01/22/2019. FINDINGS: Right Kidney: Renal measurements: Approximately 12.6 x 6.4 x 6.2 cm = volume: 264 mL. Mildly echogenic parenchyma. No hydronephrosis. Well-preserved cortex. No shadowing calculi. No focal parenchymal abnormality. Left Kidney: Renal measurements: Approximately 12.5 x 6.3 x 6.9 cm = volume: 283 mL. Mildly echogenic parenchyma. No hydronephrosis. Well-preserved cortex. No  shadowing calculi. No focal parenchymal abnormality. Bladder: Normal for degree of bladder distention. Other: None. IMPRESSION: 1. Mildly echogenic renal parenchyma indicative of medical renal disease. 2. Otherwise normal examination. Specifically, no evidence of urinary tract obstruction to account for the acute renal insufficiency. Electronically Signed   By: Evangeline Dakin M.D.   On: 03/10/2019 15:02   Craig Koch RENAL  Result Date: 03/04/2019 CLINICAL DATA:  Acute kidney injury EXAM: RENAL / URINARY TRACT ULTRASOUND COMPLETE COMPARISON:  CT 01/22/2019 FINDINGS: Right Kidney: Renal measurements: 11.4 x 6 x 6 cm = volume: 215 mL . Echogenicity within normal limits. No mass or hydronephrosis visualized. Left Kidney: Renal measurements: 11.8 x 7.3 x 6.5 cm = volume: 291.8 mL. Echogenicity within normal limits. No mass or hydronephrosis visualized. Bladder: Appears normal for degree of bladder distention. Other: Incidental note made of gallstones and small effusions. Heterogenous prostate. IMPRESSION: Normal ultrasound appearance of the kidneys Electronically Signed   By: Donavan Foil M.D.   On: 03/04/2019 15:37   PERIPHERAL VASCULAR CATHETERIZATION  Result Date: 03/14/2019 See op note  PERIPHERAL VASCULAR CATHETERIZATION  Result Date: 03/11/2019 See op note  DG Chest Port 1 View  Result Date: 03/15/2019 CLINICAL DATA:  Status post thoracentesis. EXAM: PORTABLE CHEST 1 VIEW COMPARISON:  03/14/2019 FINDINGS: Interval decrease right pleural effusion. No evidence for pneumothorax. Bibasilar atelectasis/infiltrate again noted with persistent small left pleural effusion. The cardiopericardial silhouette is within normal limits for size. Pulmonary vascular congestion has decreased in the interval. Right IJ central line tip overlies the mid to distal SVC. The visualized bony structures of the thorax are intact. Telemetry leads overlie the chest. IMPRESSION: Interval decrease in right pleural effusion without  evidence of pneumothorax. Decrease in pulmonary vascular congestion with persistent bibasilar atelectasis/infiltrate and small left pleural effusion. Electronically Signed   By: Misty Stanley M.D.   On: 03/15/2019 11:36   DG Chest Portable 1 View  Result Date: 03/03/2019 CLINICAL DATA:  Shortness of breath, since Monday worsening this morning around 1 a.m. EXAM: PORTABLE CHEST 1 VIEW COMPARISON:  12/19/2018 FINDINGS: Cardiomediastinal contours are stable following median sternotomy with persistent left-sided pleural effusion and pleural-parenchymal scarring. No new areas of consolidation or evidence of right-sided pleural effusion. No signs of  acute bone process. IMPRESSION: Persistent left-sided pleural effusion and pleural-parenchymal scarring. Electronically Signed   By: Zetta Bills M.D.   On: 03/03/2019 13:59   ECHOCARDIOGRAM COMPLETE  Result Date: 03/15/2019    ECHOCARDIOGRAM REPORT   Patient Name:   Craig Koch Date of Exam: 03/15/2019 Medical Rec #:  [id number]       Height:       72.0 in Accession #:    [id number]      Weight:       201.4 lb Date of Birth:  02/01/1955       BSA:          2.137 m Patient Age:    89 years        BP:           125/69 mmHg Patient Gender: M               HR:           83 bpm. Exam Location:  [facility] Procedure: 2D Echo, Cardiac Doppler and Color Doppler Indications:     Respiratory distress  History:         Patient has prior history of Echocardiogram examinations, most                  recent 03/04/2019. Prior CABG; Risk Factors:Hypertension and                  Diabetes.  Sonographer:     Sherrie Sport RDCS (AE) Referring Phys:  [id number] Claiborne Billings A Naquita Nappier Diagnosing Phys: Kathlyn Sacramento MD IMPRESSIONS  1. Left ventricular ejection fraction, by estimation, is 35 to 40%. The left ventricle has moderately decreased function. The left ventricle demonstrates regional wall motion abnormalities . The left ventricular internal cavity size was mildly dilated. There is mild left  ventricular hypertrophy. Left ventricular diastolic parameters are consistent with Grade II diastolic dysfunction (pseudonormalization). There is moderate hypokinesis of the left ventricular, entire inferior wall.  2. Right ventricular systolic function is normal. The right ventricular size is normal. There is moderately elevated pulmonary artery systolic pressure. The estimated right ventricular systolic pressure is 44.0 mmHg.  3. Left atrial size was mildly dilated.  4. The mitral valve is normal in structure. Moderate mitral valve regurgitation. No evidence of mitral stenosis.  5. The aortic valve is abnormal. Aortic valve regurgitation is mild. Mild to moderate aortic valve sclerosis/calcification is present, without any evidence of aortic stenosis.  6. The inferior vena cava is normal in size with greater than 50% respiratory variability, suggesting right atrial pressure of 3 mmHg. FINDINGS  Left Ventricle: Left ventricular ejection fraction, by estimation, is 35 to 40%. The left ventricle has moderately decreased function. The left ventricle demonstrates regional wall motion abnormalities. Moderate hypokinesis of the left ventricular, entire inferior wall. The left ventricular internal cavity size was mildly dilated. There is mild left ventricular hypertrophy. Left ventricular diastolic parameters are consistent with Grade II diastolic dysfunction (pseudonormalization). Right Ventricle: The right ventricular size is normal. No increase in right ventricular wall thickness. Right ventricular systolic function is normal. There is moderately elevated pulmonary artery systolic pressure. The tricuspid regurgitant velocity is 3.37 m/s, and with an assumed right atrial pressure of 3 mmHg, the estimated right ventricular systolic pressure is 34.7 mmHg. Left Atrium: Left atrial size was mildly dilated. Right Atrium: Right atrial size was normal in size. Pericardium: There is no evidence of pericardial effusion. Mitral  Valve: The mitral valve is normal in  structure. Normal mobility of the mitral valve leaflets. Moderate mitral valve regurgitation. No evidence of mitral valve stenosis. Tricuspid Valve: The tricuspid valve is normal in structure. Tricuspid valve regurgitation is mild . No evidence of tricuspid stenosis. Aortic Valve: The aortic valve is abnormal. Aortic valve regurgitation is mild. Mild to moderate aortic valve sclerosis/calcification is present, without any evidence of aortic stenosis. Aortic valve mean gradient measures 4.5 mmHg. Aortic valve peak gradient measures 8.4 mmHg. Aortic valve area, by VTI measures 2.54 cm. Pulmonic Valve: The pulmonic valve was normal in structure. Pulmonic valve regurgitation is not visualized. No evidence of pulmonic stenosis. Aorta: The aortic root is normal in size and structure. Venous: The inferior vena cava is normal in size with greater than 50% respiratory variability, suggesting right atrial pressure of 3 mmHg. IAS/Shunts: No atrial level shunt detected by color flow Doppler.  LEFT VENTRICLE PLAX 2D LVIDd:         5.34 cm      Diastology LVIDs:         4.38 cm      LV e' lateral:   5.77 cm/s LV PW:         1.00 cm      LV E/e' lateral: 22.4 LV IVS:        1.20 cm      LV e' medial:    5.00 cm/s LVOT diam:     2.00 cm      LV E/e' medial:  25.8 LV SV:         63 LV SV Index:   30 LVOT Area:     3.14 cm  LV Volumes (MOD) LV vol d, MOD A2C: 227.0 ml LV vol d, MOD A4C: 148.0 ml LV vol s, MOD A2C: 142.0 ml LV vol s, MOD A4C: 110.0 ml LV SV MOD A2C:     85.0 ml LV SV MOD A4C:     148.0 ml LV SV MOD BP:      59.1 ml RIGHT VENTRICLE RV Basal diam:  4.14 cm RV S prime:     10.30 cm/s TAPSE (M-mode): 3.2 cm LEFT ATRIUM             Index       RIGHT ATRIUM           Index LA diam:        4.80 cm 2.25 cm/m  RA Area:     15.70 cm LA Vol (A2C):   68.8 ml 32.20 ml/m RA Volume:   40.00 ml  18.72 ml/m LA Vol (A4C):   77.9 ml 36.45 ml/m LA Biplane Vol: 75.1 ml 35.14 ml/m  AORTIC VALVE                    PULMONIC VALVE AV Area (Vmax):    2.41 cm    PV Vmax:        0.99 m/s AV Area (Vmean):   2.15 cm    PV Peak grad:   3.9 mmHg AV Area (VTI):     2.54 cm    RVOT Peak grad: 4 mmHg AV Vmax:           144.50 cm/s AV Vmean:          94.950 cm/s AV VTI:            0.249 m AV Peak Grad:      8.4 mmHg AV Mean Grad:      4.5 mmHg LVOT Vmax:  111.00 cm/s LVOT Vmean:        65.100 cm/s LVOT VTI:          0.201 m LVOT/AV VTI ratio: 0.81  AORTA Ao Root diam: 2.90 cm MITRAL VALVE                TRICUSPID VALVE MV Area (PHT): 5.13 cm     TR Peak grad:   45.4 mmHg MV Decel Time: 148 msec     TR Vmax:        337.00 cm/s MV E velocity: 129.00 cm/s MV A velocity: 68.40 cm/s   SHUNTS MV E/A ratio:  1.89         Systemic VTI:  0.20 m                             Systemic Diam: 2.00 cm Kathlyn Sacramento MD Electronically signed by Kathlyn Sacramento MD Signature Date/Time: 03/15/2019/11:27:06 AM    Final    ECHOCARDIOGRAM COMPLETE  Result Date: 03/04/2019    ECHOCARDIOGRAM REPORT   Patient Name:   ARYEH BUTTERFIELD Date of Exam: 03/04/2019 Medical Rec #:  [id number]       Height:       72.0 in Accession #:    [id number]      Weight:       185.0 lb Date of Birth:  Mar 30, 1955       BSA:          2.061 m Patient Age:    11 years        BP:           125/72 mmHg Patient Gender: M               HR:           119 bpm. Exam Location:  [facility] Procedure: 2D Echo, Color Doppler and Cardiac Doppler Indications:     I21.4 NSTEMI  History:         Patient has prior history of Echocardiogram examinations, most                  recent 08/24/2018. CAD, Prior CABG; Risk Factors:Hypertension,                  Dyslipidemia and Diabetes. Lung Cancer.  Sonographer:     Charmayne Sheer RDCS (AE) Referring Phys:  [place] Phys: Kathlyn Sacramento MD IMPRESSIONS  1. Left ventricular ejection fraction, by estimation, is 30 to 35%. The left ventricle has moderately decreased function. The left ventricle demonstrates global  hypokinesis. Left ventricular diastolic parameters are indeterminate.  2. Right ventricular systolic function is normal. The right ventricular size is normal. Tricuspid regurgitation signal is inadequate for assessing PA pressure.  3. Left atrial size was mild to moderately dilated.  4. The mitral valve is normal in structure and function. Moderate mitral valve regurgitation. No evidence of mitral stenosis.  5. The aortic valve is normal in structure and function. Aortic valve regurgitation is trivial. Mild to moderate aortic valve sclerosis/calcification is present, without any evidence of aortic stenosis.  6. The inferior vena cava is normal in size with greater than 50% respiratory variability, suggesting right atrial pressure of 3 mmHg. FINDINGS  Left Ventricle: Left ventricular ejection fraction, by estimation, is 30 to 35%. The left ventricle has moderately decreased function. The left ventricle demonstrates global hypokinesis. The left ventricular internal cavity size was normal in size.  There is no left ventricular hypertrophy. Left ventricular diastolic parameters are indeterminate. Right Ventricle: The right ventricular size is normal. No increase in right ventricular wall thickness. Right ventricular systolic function is normal. Tricuspid regurgitation signal is inadequate for assessing PA pressure. Left Atrium: Left atrial size was mild to moderately dilated. Right Atrium: Right atrial size was normal in size. Pericardium: There is no evidence of pericardial effusion. Mitral Valve: The mitral valve is normal in structure and function. Normal mobility of the mitral valve leaflets. Moderate mitral valve regurgitation. No evidence of mitral valve stenosis. MV peak gradient, 7.5 mmHg. The mean mitral valve gradient is 4.0  mmHg. Tricuspid Valve: The tricuspid valve is normal in structure. Tricuspid valve regurgitation is not demonstrated. No evidence of tricuspid stenosis. Aortic Valve: The aortic valve is  normal in structure and function. Aortic valve regurgitation is trivial. Aortic regurgitation PHT measures 281 msec. Mild to moderate aortic valve sclerosis/calcification is present, without any evidence of aortic stenosis. Aortic valve mean gradient measures 5.0 mmHg. Aortic valve peak gradient measures 8.5 mmHg. Aortic valve area, by VTI measures 2.53 cm. Pulmonic Valve: The pulmonic valve was normal in structure. Pulmonic valve regurgitation is not visualized. No evidence of pulmonic stenosis. Aorta: The aortic root is normal in size and structure. Venous: The inferior vena cava is normal in size with greater than 50% respiratory variability, suggesting right atrial pressure of 3 mmHg. IAS/Shunts: No atrial level shunt detected by color flow Doppler.  LEFT VENTRICLE PLAX 2D LVIDd:         5.21 cm  Diastology LVIDs:         4.21 cm  LV e' lateral:   7.83 cm/s LV PW:         0.84 cm  LV E/e' lateral: 13.4 LV IVS:        0.89 cm  LV e' medial:    7.94 cm/s LVOT diam:     2.40 cm  LV E/e' medial:  13.2 LV SV:         54 LV SV Index:   26 LVOT Area:     4.52 cm  RIGHT VENTRICLE RV Basal diam:  3.08 cm LEFT ATRIUM             Index       RIGHT ATRIUM           Index LA diam:        4.70 cm 2.28 cm/m  RA Area:     14.10 cm LA Vol (A2C):   62.0 ml 30.08 ml/m RA Volume:   35.20 ml  17.08 ml/m LA Vol (A4C):   68.6 ml 33.29 ml/m LA Biplane Vol: 65.3 ml 31.68 ml/m  AORTIC VALVE                    PULMONIC VALVE AV Area (Vmax):    2.95 cm     PV Vmax:       1.10 m/s AV Area (Vmean):   2.86 cm     PV Vmean:      68.400 cm/s AV Area (VTI):     2.53 cm     PV VTI:        0.146 m AV Vmax:           146.00 cm/s  PV Peak grad:  4.8 mmHg AV Vmean:          103.000 cm/s PV Mean grad:  2.0 mmHg AV VTI:  0.213 m AV Peak Grad:      8.5 mmHg AV Mean Grad:      5.0 mmHg LVOT Vmax:         95.30 cm/s LVOT Vmean:        65.200 cm/s LVOT VTI:          0.119 m LVOT/AV VTI ratio: 0.56 AI PHT:            281 msec  AORTA Ao  Root diam: 3.00 cm MITRAL VALVE MV Area (PHT): 3.92 cm     SHUNTS MV Peak grad:  7.5 mmHg     Systemic VTI:  0.12 m MV Mean grad:  4.0 mmHg     Systemic Diam: 2.40 cm MV Vmax:       1.37 m/s MV Vmean:      92.7 cm/s MV Decel Time: 194 msec MV E velocity: 104.80 cm/s Kathlyn Sacramento MD Electronically signed by Kathlyn Sacramento MD Signature Date/Time: 03/04/2019/2:46:43 PM    Final    Craig Koch RT UPPER EXTREM LTD SOFT TISSUE NON VASCULAR  Result Date: 03/08/2019 CLINICAL DATA:  Swelling and bruising of the right upper extremity. Possible hematoma. EXAM: ULTRASOUND right UPPER EXTREMITY LIMITED TECHNIQUE: Ultrasound examination of the upper extremity soft tissues was performed in the area of clinical concern. COMPARISON:  None. FINDINGS: Diffuse subcutaneous soft tissue swelling/edema and streaky fluid. No discrete fluid collection or hematoma. The underlying musculature is grossly normal. IMPRESSION: Diffuse subcutaneous soft tissue swelling/edema/streaky fluid but no discrete abscess or hematoma. Electronically Signed   By: Marijo Sanes M.D.   On: 03/08/2019 19:13   Craig Koch THORACENTESIS ASP PLEURAL SPACE W/IMG GUIDE  Result Date: 03/15/2019 INDICATION: History of lung cancer with recurrent right pleural effusion. Request for diagnostic and therapeutic thoracentesis. EXAM: ULTRASOUND GUIDED RIGHT THORACENTESIS MEDICATIONS: 1% lidocaine 10 mL COMPLICATIONS: None immediate. PROCEDURE: An ultrasound guided thoracentesis was thoroughly discussed with the patient and questions answered. The benefits, risks, alternatives and complications were also discussed. The patient understands and wishes to proceed with the procedure. Written consent was obtained. Ultrasound was performed to localize and mark an adequate pocket of fluid in the right chest. The area was then prepped and draped in the normal sterile fashion. 1% Lidocaine was used for local anesthesia. Under ultrasound guidance a 6 Fr Safe-T-Centesis catheter was introduced.  Thoracentesis was performed. The catheter was removed and a dressing applied. FINDINGS: A total of approximately 2.7 L of clear yellow fluid was removed. Samples were sent to the laboratory as requested by the clinical team. IMPRESSION: Successful ultrasound guided right thoracentesis yielding 2.7 L of pleural fluid. No pneumothorax on post-procedure chest x-ray. Read by: Gareth Eagle, PA-C Electronically Signed   By: Sandi Mariscal M.D.   On: 03/15/2019 12:44    Dialysis Echocardiogram - EF 63-14%, grade 2 diastolic dysfunction   Subjective: Patient seen with wife at bedside today.  No acute events reported.  Patient denies SOB, chest pain, fever/chills, N/V/D.  Feels bloated and gassy.  Had nosebleed after Covid swab earlier this AM.  No other acute complaints   Discharge Exam: Vitals:   03/19/19 0817 03/19/19 1123  BP: 121/67 117/61  Pulse: 72 78  Resp: 17 17  Temp: 98.2 F (36.8 C) 97.8 F (36.6 C)  SpO2: 100% 96%   Vitals:   03/19/19 0422 03/19/19 0736 03/19/19 0817 03/19/19 1123  BP: 117/61  121/67 117/61  Pulse: 69  72 78  Resp:   17 17  Temp: 98 F (36.7  C)  98.2 F (36.8 C) 97.8 F (36.6 C)  TempSrc: Oral  Oral   SpO2: 98% 98% 100% 96%  Weight: 86.2 kg     Height:        General: Pt is alert, awake, not in acute distress Cardiovascular: RRR, S1/S2 +, no rubs, no gallops, permcath present upper right chest Respiratory: CTA bilaterally, mildly diminished at right base, no wheezing, no rhonchi Abdominal: Soft, NT, ND, bowel sounds + Extremities: no edema, no cyanosis    The results of significant diagnostics from this hospitalization (including imaging, microbiology, ancillary and laboratory) are listed below for reference.     Microbiology: Recent Results (from the past 240 hour(s))  MRSA PCR Screening     Status: None   Collection Time: 03/15/19  9:08 AM   Specimen: Nasopharyngeal  Result Value Ref Range Status   MRSA by PCR NEGATIVE NEGATIVE Final     Comment:        The GeneXpert MRSA Assay (FDA approved for NASAL specimens only), is one component of a comprehensive MRSA colonization surveillance program. It is not intended to diagnose MRSA infection nor to guide or monitor treatment for MRSA infections. Performed at [facility], [address] [place], [place] [address]   Body fluid culture     Status: None   Collection Time: 03/15/19 11:00 AM   Specimen: PATH Cytology Pleural fluid  Result Value Ref Range Status   Specimen Description   Final    PLEURAL Performed at [facility], [address] [place], [place] [address]    Special Requests   Final    NONE Performed at [facility], [address] [place], [place] [address]    Gram Stain   Final    NO WBC SEEN NO ORGANISMS SEEN Performed at [facility], [place] [address], [place], [place] [address]    Culture NO GROWTH  Final   Report Status 03/18/2019 FINAL  Final  Respiratory Panel by RT PCR (Flu A&B, Covid) - Nasopharyngeal Swab     Status: None   Collection Time: 03/19/19 10:41 AM   Specimen: Nasopharyngeal Swab  Result Value Ref Range Status   SARS Coronavirus 2 by RT PCR NEGATIVE NEGATIVE Final    Comment: (NOTE) SARS-CoV-2 target nucleic acids are NOT DETECTED. The SARS-CoV-2 RNA is generally detectable in upper respiratoy specimens during the acute phase of infection. The lowest concentration of SARS-CoV-2 viral copies this assay can detect is 131 copies/mL. A negative result does not preclude SARS-Cov-2 infection and should not be used as the sole basis for treatment or other patient management decisions. A negative result may occur with  improper specimen collection/handling, submission of specimen other than nasopharyngeal swab, presence of viral mutation(s) within the areas targeted by this assay, and inadequate number of viral copies (<131 copies/mL). A negative result must be combined with clinical observations,  patient history, and epidemiological information. The expected result is Negative. Fact Sheet for Patients:  PinkCheek.be Fact Sheet for Healthcare Providers:  GravelBags.it This test is not yet ap proved or cleared by the [place] FDA and  has been authorized for detection and/or diagnosis of SARS-CoV-2 by FDA under an Emergency Use Authorization (EUA). This EUA will remain  in effect (meaning this test can be used) for the duration of the COVID-19 declaration under Section 564(b)(1) of the Act, 21 U.S.C. section 360bbb-3(b)(1), unless the authorization is terminated or revoked sooner.    Influenza A by PCR NEGATIVE  NEGATIVE Final   Influenza B by PCR NEGATIVE NEGATIVE Final    Comment: (NOTE) The Xpert Xpress SARS-CoV-2/FLU/RSV assay is intended as an aid in  the diagnosis of influenza from Nasopharyngeal swab specimens and  should not be used as a sole basis for treatment. Nasal washings and  aspirates are unacceptable for Xpert Xpress SARS-CoV-2/FLU/RSV  testing. Fact Sheet for Patients: PinkCheek.be Fact Sheet for Healthcare Providers: GravelBags.it This test is not yet approved or cleared by the [place] FDA and  has been authorized for detection and/or diagnosis of SARS-CoV-2 by  FDA under an Emergency Use Authorization (EUA). This EUA will remain  in effect (meaning this test can be used) for the duration of the  Covid-19 declaration under Section 564(b)(1) of the Act, 21  U.S.C. section 360bbb-3(b)(1), unless the authorization is  terminated or revoked. Performed at [facility], [address] [place], [place] [address]      Labs: BNP (last 3 results) Recent Labs    03/03/19 1330  BNP 086.5*   Basic Metabolic Panel: Recent Labs  Lab 03/14/19 0520 03/14/19 1133 03/15/19 0554 03/16/19 0432 03/17/19 0440 03/18/19 0541  03/19/19 0320  NA   < >  --  135 135 136 135 138  K   < >  --  3.8 4.2 3.6 3.8 3.6  CL   < >  --  96* 98 97* 96* 100  CO2   < >  --  _0 GLUCOSE   < >  --  159* 251* 154* 144* 206*  BUN   < >  --  25* 19 28* 37* 26*  CREATININE   < >  --  2.81* 2.49* 3.09* 3.70* 2.45*  CALCIUM   < >  --  8.0* 7.7* 8.1* 8.0* 8.0*  PHOS  --  2.8  --   --   --   --   --    < > = values in this interval not displayed.   Liver Function Tests: Recent Labs  Lab 03/16/19 0927  PROT 5.7*   No results for input(s): LIPASE, AMYLASE in the last 168 hours. No results for input(s): AMMONIA in the last 168 hours. CBC: Recent Labs  Lab 03/15/19 0554 03/16/19 0432 03/17/19 0440 03/18/19 0541 03/18/19 1256  WBC 7.0 5.3 6.7 7.2 7.1  NEUTROABS  --   --   --  5.7  --   HGB 9.3* 8.2* 8.8* 8.3* 8.6*  HCT 29.6* 25.4* 26.9* 26.1* 26.8*  MCV 87.6 87.3 87.6 87.6 88.2  PLT 238 223 226 215 237   Cardiac Enzymes: No results for input(s): CKTOTAL, CKMB, CKMBINDEX, TROPONINI in the last 168 hours. BNP: Invalid input(s): POCBNP CBG: Recent Labs  Lab 03/18/19 1133 03/18/19 1701 03/18/19 2001 03/19/19 0731 03/19/19 1215  GLUCAP 103* 126* 170* 120* 165*   D-Dimer No results for input(s): DDIMER in the last 72 hours. Hgb A1c No results for input(s): HGBA1C in the last 72 hours. Lipid Profile No results for input(s): CHOL, HDL, LDLCALC, TRIG, CHOLHDL, LDLDIRECT in the last 72 hours. Thyroid function studies No results for input(s): TSH, T4TOTAL, T3FREE, THYROIDAB in the last 72 hours.  Invalid input(s): FREET3 Anemia work up No results for input(s): VITAMINB12, FOLATE, FERRITIN, TIBC, IRON, RETICCTPCT in the last 72 hours. Urinalysis    Component Value Date/Time   COLORURINE YELLOW (A) 03/17/2019 1940   APPEARANCEUR HAZY (A) 03/17/2019 1940   LABSPEC 1.017 03/17/2019 [place]  5.0 03/17/2019 1940   GLUCOSEU 50 (A) 03/17/2019 1940   HGBUR NEGATIVE 03/17/2019 [place]  NEGATIVE 03/17/2019 1940   KETONESUR 5 (A) 03/17/2019 1940   PROTEINUR 100 (A) 03/17/2019 1940   NITRITE NEGATIVE 03/17/2019 1940   LEUKOCYTESUR NEGATIVE 03/17/2019 1940   Sepsis Labs Invalid input(s): PROCALCITONIN,  WBC,  LACTICIDVEN Microbiology Recent Results (from the past 240 hour(s))  MRSA PCR Screening     Status: None   Collection Time: 03/15/19  9:08 AM   Specimen: Nasopharyngeal  Result Value Ref Range Status   MRSA by PCR NEGATIVE NEGATIVE Final    Comment:        The GeneXpert MRSA Assay (FDA approved for NASAL specimens only), is one component of a comprehensive MRSA colonization surveillance program. It is not intended to diagnose MRSA infection nor to guide or monitor treatment for MRSA infections. Performed at [facility], [address] [place], [place] [address]   Body fluid culture     Status: None   Collection Time: 03/15/19 11:00 AM   Specimen: PATH Cytology Pleural fluid  Result Value Ref Range Status   Specimen Description   Final    PLEURAL Performed at [facility], [address] [place], [place] [address]    Special Requests   Final    NONE Performed at [facility], [address] [place],  [address]    Gram Stain   Final    NO WBC SEEN NO ORGANISMS SEEN Performed at [facility], [place] [address], [place],  [address]    Culture NO GROWTH  Final   Report Status 03/18/2019 FINAL  Final  Respiratory Panel by RT PCR (Flu A&B, Covid) - Nasopharyngeal Swab     Status: None   Collection Time: 03/19/19 10:41 AM   Specimen: Nasopharyngeal Swab  Result Value Ref Range Status   SARS Coronavirus 2 by RT PCR NEGATIVE NEGATIVE Final    Comment: (NOTE) SARS-CoV-2 target nucleic acids are NOT DETECTED. The SARS-CoV-2 RNA is generally detectable in upper respiratoy specimens during the acute phase of infection. The lowest concentration of SARS-CoV-2 viral copies this assay can detect is 131  copies/mL. A negative result does not preclude SARS-Cov-2 infection and should not be used as the sole basis for treatment or other patient management decisions. A negative result may occur with  improper specimen collection/handling, submission of specimen other than nasopharyngeal swab, presence of viral mutation(s) within the areas targeted by this assay, and inadequate number of viral copies (<131 copies/mL). A negative result must be combined with clinical observations, patient history, and epidemiological information. The expected result is Negative. Fact Sheet for Patients:  PinkCheek.be Fact Sheet for Healthcare Providers:  GravelBags.it This test is not yet ap proved or cleared by the [place] FDA and  has been authorized for detection and/or diagnosis of SARS-CoV-2 by FDA under an Emergency Use Authorization (EUA). This EUA will remain  in effect (meaning this test can be used) for the duration of the COVID-19 declaration under Section 564(b)(1) of the Act, 21 U.S.C. section 360bbb-3(b)(1), unless the authorization is terminated or revoked sooner.    Influenza A by PCR NEGATIVE NEGATIVE Final   Influenza B by PCR NEGATIVE NEGATIVE Final    Comment: (NOTE) The Xpert Xpress SARS-CoV-2/FLU/RSV assay is intended as an aid in  the diagnosis of influenza from Nasopharyngeal swab specimens and  should not be used as a sole basis for treatment. Nasal washings and  aspirates are unacceptable for Xpert Xpress SARS-CoV-2/FLU/RSV  testing. Fact Sheet for Patients: PinkCheek.be Fact Sheet for Healthcare Providers: GravelBags.it This test is not yet approved or cleared by the [place] FDA and  has been authorized for detection and/or diagnosis of SARS-CoV-2 by  FDA under an Emergency Use Authorization (EUA). This EUA will remain  in effect (meaning this test can  be used) for the duration of the  Covid-19 declaration under Section 564(b)(1) of the Act, 21  U.S.C. section 360bbb-3(b)(1), unless the authorization is  terminated or revoked. Performed at [facility], [address] [place], [place] [address]      Time coordinating discharge: Over 30 minutes  SIGNED:   Ezekiel Slocumb, DO [facility] 03/19/2019, 1:47 PM   If 7PM-7AM, please contact night-coverage [email or web address]

## 2019-03-19 NOTE — Telephone Encounter (Signed)
-----   Message from Nelva Bush, MD sent at 03/19/2019  7:59 AM EDT ----- Regarding: Hospital follow-up Good morning,  Mr. Ragone will likely be discharged later today or tomorrow.  Could you schedule him for a hospital f/u appointment with Dr. Rockey Situ or an APP in ~2 weeks?  Thanks.  Gerald Stabs

## 2019-03-19 NOTE — Progress Notes (Signed)
[facility]  ROUNDING NOTE   Subjective:   Hemodialysis treatment yesterday. Tolerated treatment well. Seated in chair. UF of 1564mL.   Objective:  Vital signs in last 24 hours:  Temp:  [97.5 F (36.4 C)-98.2 F (36.8 C)] 98.2 F (36.8 C) (03/16 0817) Pulse Rate:  [64-80] 72 (03/16 0817) Resp:  [17-22] 17 (03/16 0817) BP: (100-124)/(47-67) 121/67 (03/16 0817) SpO2:  [97 %-100 %] 100 % (03/16 0817) Weight:  [86.2 kg] 86.2 kg (03/16 0422)  Weight change: -4.717 kg Filed Weights   03/18/19 0403 03/19/19 0422  Weight: 90.9 kg 86.2 kg    Intake/Output: I/O last 3 completed shifts: In: 83 [P.O.:480; IV Piggyback:50] Out: 2050 [Urine:550; Other:1500]   Intake/Output this shift:  Total I/O In: 240 [P.O.:240] Out: 0   Physical Exam: General: NAD,   Head: Normocephalic, atraumatic. Moist oral mucosal membranes  Eyes: Anicteric, PERRL  Neck: Supple, trachea midline  Lungs:  Clear to auscultation  Heart: Regular rate and rhythm  Abdomen:  Soft, nontender,   Extremities:  ++peripheral edema.  Neurologic: Nonfocal, moving all four extremities  Skin: No lesions  Access: RIJ permcath     Basic Metabolic Panel: Recent Labs  Lab 03/12/19 1012 03/13/19 0417 03/14/19 0520 03/14/19 1133 03/15/19 0554 03/15/19 0554 03/16/19 0432 03/16/19 0432 03/17/19 0440 03/18/19 0541 03/19/19 0320  NA  --    < >   < >  --  135  --  135  --  136 135 138  K  --    < >   < >  --  3.8  --  4.2  --  3.6 3.8 3.6  CL  --    < >   < >  --  96*  --  98  --  97* 96* 100  CO2  --    < >   < >  --  24  --  29  --  29 29 29   GLUCOSE  --    < >   < >  --  159*  --  251*  --  154* 144* 206*  BUN  --    < >   < >  --  25*  --  19  --  28* 37* 26*  CREATININE  --    < >   < >  --  2.81*  --  2.49*  --  3.09* 3.70* 2.45*  CALCIUM  --    < >   < >  --  8.0*   < > 7.7*   < > 8.1* 8.0* 8.0*  PHOS 4.7*  --   --  2.8  --   --   --   --   --   --   --    < > = values in this interval not  displayed.    Liver Function Tests: Recent Labs  Lab 03/16/19 0927  PROT 5.7*   No results for input(s): LIPASE, AMYLASE in the last 168 hours. No results for input(s): AMMONIA in the last 168 hours.  CBC: Recent Labs  Lab 03/15/19 0554 03/16/19 0432 03/17/19 0440 03/18/19 0541 03/18/19 1256  WBC 7.0 5.3 6.7 7.2 7.1  NEUTROABS  --   --   --  5.7  --   HGB 9.3* 8.2* 8.8* 8.3* 8.6*  HCT 29.6* 25.4* 26.9* 26.1* 26.8*  MCV 87.6 87.3 87.6 87.6 88.2  PLT 238 223 226 215 237    Cardiac Enzymes: No  results for input(s): CKTOTAL, CKMB, CKMBINDEX, TROPONINI in the last 168 hours.  BNP: Invalid input(s): POCBNP  CBG: Recent Labs  Lab 03/18/19 0725 03/18/19 1133 03/18/19 1701 03/18/19 2001 03/19/19 0731  GLUCAP 95 103* 126* 170* 120*    Microbiology: Results for orders placed or performed during the hospital encounter of 03/03/19  Respiratory Panel by RT PCR (Flu A&B, Covid) - Nasopharyngeal Swab     Status: None   Collection Time: 03/03/19  3:31 PM   Specimen: Nasopharyngeal Swab  Result Value Ref Range Status   SARS Coronavirus 2 by RT PCR NEGATIVE NEGATIVE Final    Comment: (NOTE) SARS-CoV-2 target nucleic acids are NOT DETECTED. The SARS-CoV-2 RNA is generally detectable in upper respiratoy specimens during the acute phase of infection. The lowest concentration of SARS-CoV-2 viral copies this assay can detect is 131 copies/mL. A negative result does not preclude SARS-Cov-2 infection and should not be used as the sole basis for treatment or other patient management decisions. A negative result may occur with  improper specimen collection/handling, submission of specimen other than nasopharyngeal swab, presence of viral mutation(s) within the areas targeted by this assay, and inadequate number of viral copies (<131 copies/mL). A negative result must be combined with clinical observations, patient history, and epidemiological information. The expected result is  Negative. Fact Sheet for Patients:  PinkCheek.be Fact Sheet for Healthcare Providers:  GravelBags.it This test is not yet ap proved or cleared by the [place] FDA and  has been authorized for detection and/or diagnosis of SARS-CoV-2 by FDA under an Emergency Use Authorization (EUA). This EUA will remain  in effect (meaning this test can be used) for the duration of the COVID-19 declaration under Section 564(b)(1) of the Act, 21 U.S.C. section 360bbb-3(b)(1), unless the authorization is terminated or revoked sooner.    Influenza A by PCR NEGATIVE NEGATIVE Final   Influenza B by PCR NEGATIVE NEGATIVE Final    Comment: (NOTE) The Xpert Xpress SARS-CoV-2/FLU/RSV assay is intended as an aid in  the diagnosis of influenza from Nasopharyngeal swab specimens and  should not be used as a sole basis for treatment. Nasal washings and  aspirates are unacceptable for Xpert Xpress SARS-CoV-2/FLU/RSV  testing. Fact Sheet for Patients: PinkCheek.be Fact Sheet for Healthcare Providers: GravelBags.it This test is not yet approved or cleared by the [place] FDA and  has been authorized for detection and/or diagnosis of SARS-CoV-2 by  FDA under an Emergency Use Authorization (EUA). This EUA will remain  in effect (meaning this test can be used) for the duration of the  Covid-19 declaration under Section 564(b)(1) of the Act, 21  U.S.C. section 360bbb-3(b)(1), unless the authorization is  terminated or revoked. Performed at [facility], [address] [place], [place] [address]   MRSA PCR Screening     Status: None   Collection Time: 03/03/19  7:48 PM   Specimen: Nasopharyngeal  Result Value Ref Range Status   MRSA by PCR NEGATIVE NEGATIVE Final    Comment:        The GeneXpert MRSA Assay (FDA approved for NASAL specimens only), is one component of  a comprehensive MRSA colonization surveillance program. It is not intended to diagnose MRSA infection nor to guide or monitor treatment for MRSA infections. Performed at [facility], [place], [place] [address]   CULTURE, BLOOD (ROUTINE X 2) w Reflex to ID Panel     Status: None   Collection Time: 03/05/19 10:16 AM  Specimen: BLOOD  Result Value Ref Range Status   Specimen Description BLOOD LEFT ANTECUBITAL  Final   Special Requests   Final    BOTTLES DRAWN AEROBIC AND ANAEROBIC Blood Culture adequate volume   Culture   Final    NO GROWTH 5 DAYS Performed at [facility], [address] [place], [place] [address]    Report Status 03/10/2019 FINAL  Final  CULTURE, BLOOD (ROUTINE X 2) w Reflex to ID Panel     Status: None   Collection Time: 03/05/19 10:22 AM   Specimen: BLOOD  Result Value Ref Range Status   Specimen Description BLOOD BLOOD LEFT HAND  Final   Special Requests   Final    BOTTLES DRAWN AEROBIC ONLY Blood Culture adequate volume   Culture   Final    NO GROWTH 5 DAYS Performed at [facility], [address] [place], [place] [address]    Report Status 03/10/2019 FINAL  Final  MRSA PCR Screening     Status: None   Collection Time: 03/15/19  9:08 AM   Specimen: Nasopharyngeal  Result Value Ref Range Status   MRSA by PCR NEGATIVE NEGATIVE Final    Comment:        The GeneXpert MRSA Assay (FDA approved for NASAL specimens only), is one component of a comprehensive MRSA colonization surveillance program. It is not intended to diagnose MRSA infection nor to guide or monitor treatment for MRSA infections. Performed at [facility], [address] [place], [place] [address]   Body fluid culture     Status: None   Collection Time: 03/15/19 11:00 AM   Specimen: PATH Cytology Pleural fluid  Result Value Ref Range Status   Specimen Description   Final    PLEURAL Performed at [facility], [address] [place], [place] [address]    Special Requests   Final    NONE Performed at [facility], [address] [place], [place] [address]    Gram Stain   Final    NO WBC SEEN NO ORGANISMS SEEN Performed at [facility], [place] [address] [place],  [address]    Culture NO GROWTH  Final   Report Status 03/18/2019 FINAL  Final    Coagulation Studies: No results for input(s): LABPROT, INR in the last 72 hours.  Urinalysis: Recent Labs    03/17/19 1940  COLORURINE YELLOW*  LABSPEC 1.017  PHURINE 5.0  GLUCOSEU 50*  HGBUR NEGATIVE  BILIRUBINUR NEGATIVE  KETONESUR 5*  PROTEINUR 100*  NITRITE NEGATIVE  LEUKOCYTESUR NEGATIVE      Imaging: No results found.   Medications:   . piperacillin-tazobactam (ZOSYN)  IV 3.375 g (03/19/19 0936)   . sodium chloride  250 mL Intravenous Once  . amiodarone  200 mg Oral Daily  . apixaban  5 mg Oral BID  . aspirin EC  81 mg Oral Daily  . chlorhexidine  15 mL Mouth Rinse BID  . Chlorhexidine Gluconate Cloth  6 each Topical Q0600  . dextromethorphan-guaiFENesin  1 tablet Oral BID  . dronabinol  5 mg Oral BID AC  . DULoxetine  30 mg Oral Daily  . ferrous sulfate  325 mg Oral BID WC  . folic acid  1 mg Oral Daily  . insulin aspart  0-5 Units Subcutaneous QHS  . insulin aspart  0-9 Units Subcutaneous TID WC  . insulin detemir  20 Units Subcutaneous QHS  . ipratropium-albuterol  3 mL Nebulization TID  .  mouth rinse  15 mL Mouth Rinse q12n4p  . metoprolol succinate  12.5 mg Oral Daily  . multivitamin  1 tablet Oral QHS  . multivitamin with minerals  1 tablet Oral Daily  . nicotine  21 mg Transdermal Daily  . oxymetazoline  1 spray Each Nare BID  . polyethylene glycol  17 g Oral Daily  . Ensure Max Protein  11 oz Oral BID BM  . rosuvastatin  10 mg Oral Daily  . sodium chloride flush  10-40 mL Intracatheter Q12H  . thiamine  100 mg Oral Daily   acetaminophen, albuterol, bisacodyl, dextrose, heparin lock  flush, heparin lock flush, HYDROmorphone (DILAUDID) injection, menthol-cetylpyridinium, ondansetron (ZOFRAN) IV, ondansetron (ZOFRAN) IV, pneumococcal 23 valent vaccine, simethicone, sodium chloride, sodium chloride flush  Assessment/ Plan:  [name] is a 64 y.o. white male with hypertension, hyperlipidemia, diabetes, depression, anxiety, adenocarcinoma of the lung stage IV, coronary disease, history of CABG, tobacco and h/o alcohol abuse, left pleural effusion, admitted on 03/03/2019 for DKA (diabetic ketoacidoses) ([place]) [E11.10] New onset atrial fibrillation ([place]) [I48.91] Atrial fibrillation with rapid ventricular response ([place]) [I48.91] Diabetic ketoacidosis without coma associated with other specified diabetes mellitus ([place]) [E13.10]   1. Acute renal failure with metabolic acidosis: requiring renal replacement therapy. Last dialysis was 3/12 Baseline creatinine of 0.7, GFR > 60 on 02/08/19.  Most likely with underlying diabetic nephropathy.  Nonoliguric urine output. No acute indication for dialysis today.  - Dialysis on MWF schedule. Outpatient planning for [address]   2. Diabetes mellitus type II : hemoglobin A1c of 11.3% on 03/04/19. Poor control.  Holding metformin.   4. Anemia with renal failure: hemoglobin 8.3, normocytic.  - EPO with HD treatment. Will discuss with hematology.   5. Hypertension: with atrial fibrillation and coronary artery disease.  Appreciate cardiology input.  - metoprolol.   6. Pneumonia: status post right thoracentesis on 3/12 with 2.7 liters removed.  - pip/tazo.    LOS: 16 Samyak Sackmann 3/16/202110:01 AM

## 2019-03-19 NOTE — Telephone Encounter (Signed)
TCM....  Patient is being discharged      They are scheduled to see  Gollan on 3/29 at 1120  They were seen for NSTEMI AFIB   They need to be seen within ~2 weeks      Please call

## 2019-03-19 NOTE — Progress Notes (Signed)
Hematology/Oncology Progress Note [facility] Telephone:([phone] [phone]  Patient Care Team: Tonia Ghent, MD as PCP - General (Family Medicine) Thelma Comp, [place] (Optometry) Telford Nab, RN as Registered Nurse   Name of the patient: Craig Koch  [id number]  02-20-1955  Date of visit: 03/19/19   INTERVAL HISTORY-  Patient was sitting in the chair.  Wife is at chair side.  He has no new complaints. He reports breathing is okay today. Review of systems- Review of Systems  Constitutional: Positive for appetite change and fatigue.  Respiratory: Positive for cough. Negative for shortness of breath.   Gastrointestinal: Negative for abdominal pain.  Genitourinary: Negative for dysuria.   Musculoskeletal: Negative for neck pain.       Right arm pain is better  Skin: Negative for rash and wound.  Neurological: Negative for headaches.  Hematological: Negative for adenopathy. Bruises/bleeds easily.  Psychiatric/Behavioral: Negative for confusion.    Allergies  Allergen Reactions  . Lipitor [Atorvastatin Calcium] Other (See Comments)    Aches.  Tolerated crestor.     Patient Active Problem List   Diagnosis Date Noted  . Pleural effusion   . AKI (acute kidney injury) ([place])   . Acute systolic heart failure ([place])   . Pain of right upper extremity   . Microcytic anemia   . Protein-calorie malnutrition, severe 03/06/2019  . Acute alteration in mental status   . Palliative care encounter   . Atrial fibrillation with RVR ([place]) 03/03/2019  . DKA (diabetic ketoacidoses) ([place]) 03/03/2019  . Diabetes mellitus without complication ([place]) 90/38/3338  . Hypothermia 03/03/2019  . SOB (shortness of breath) 03/03/2019  . Acute renal failure ([place]) 03/03/2019  . NSTEMI (non-ST elevated myocardial infarction) ([place]) 03/03/2019  . Hyponatremia 02/08/2019  . Uncontrolled type 2 diabetes mellitus with hyperglycemia ([place]) 02/08/2019  . Leg  swelling 02/08/2019  . Paronychia, finger 12/19/2018  . Encounter for antineoplastic chemotherapy 12/08/2018  . Tobacco abuse 10/12/2018  . Malignant pleural effusion   . Administrative encounter 09/23/2018  . lung adenocarcinoma 09/08/2018  . Goals of care, counseling/discussion 09/08/2018  . Recurrent pleural effusion on left 09/02/2018  . Elevated troponin 08/24/2018  . Medicare annual wellness visit, subsequent 08/07/2018  . Lower urinary tract symptoms (LUTS) 08/07/2018  . Neck pain 08/07/2018  . CAD (coronary artery disease), native coronary artery 09/23/2017  . Smoker 09/23/2017  . Chronic joint pain 10/18/2016  . Healthcare maintenance 07/17/2016  . Advance care planning 07/17/2016  . Fungal rash of torso 05/06/2016  . Trigger finger 05/06/2016  . History of alcohol use 11/17/2015  . Skin lesion 05/19/2015  . Anxiety state 02/19/2015  . Hand weakness 05/23/2011  . Diabetes mellitus with complication ([place]) 32/91/9166  . HLD (hyperlipidemia) 03/05/2010  . Essential hypertension 03/05/2010  . Coronary atherosclerosis 03/05/2010     Past Medical History:  Diagnosis Date  . Anxiety   . Arthritis   . Coronary artery disease   . Diabetes mellitus   . Dyslipidemia   . Hx of CABG   . Hyperlipidemia   . Hypertension   . Malignant neoplasm of unspecified part of unspecified bronchus or lung ([place]) 08/2018   Immunotherapy  . Pleural effusion      Past Surgical History:  Procedure Laterality Date  . CATARACT EXTRACTION Left 09/2015  . CHEST TUBE INSERTION Left 10/01/2018   Procedure: INSERTION PLEURAL DRAINAGE CATHETER;  Surgeon: [name], MD;  Location: [facility] ORS;  Service: Thoracic;  Laterality: Left;  .  CORONARY ARTERY BYPASS GRAFT  2004   (CABG with LIMA to the  LAD, SVG to OM2/OM3, SVG  to diag  . DIALYSIS/PERMA CATHETER INSERTION N/A 03/14/2019   Procedure: DIALYSIS/PERMA CATHETER INSERTION;  Surgeon: Algernon Huxley, MD;  Location: [place] [facility];   Service: Cardiovascular;  Laterality: N/A;  . Left ankle surgery     repair of fracture  . Right lower leg surgery     rod  . TEMPORARY DIALYSIS CATHETER N/A 03/11/2019   Procedure: TEMPORARY DIALYSIS CATHETER;  Surgeon: Algernon Huxley, MD;  Location: [place] [facility];  Service: Cardiovascular;  Laterality: N/A;    Social History   Socioeconomic History  . Marital status: Married    Spouse name: Not on file  . Number of children: Not on file  . Years of education: Not on file  . Highest education level: Not on file  Occupational History  . Not on file  Tobacco Use  . Smoking status: Current Every Day Smoker    Packs/day: 0.20    Years: 45.00    Pack years: 9.00    Types: Cigarettes  . Smokeless tobacco: Former Systems developer    Types: Snuff  Substance and Sexual Activity  . Alcohol use: Yes    Alcohol/week: 6.0 standard drinks    Types: 6 Cans of beer per week    Comment: occ, average 6 pack in a week  . Drug use: No  . Sexual activity: Not Currently  Other Topics Concern  . Not on file  Social History Narrative   On disability 2009 after prev injuries and CAD.     Married 1976   2 kids, 4 grandkids.    Social Determinants of Health   Financial Resource Strain:   . Difficulty of Paying Living Expenses:   Food Insecurity:   . Worried About Charity fundraiser in the Last Year:   . Arboriculturist in the Last Year:   Transportation Needs:   . Film/video editor (Medical):   [name] Lack of Transportation (Non-Medical):   Physical Activity:   . Days of Exercise per Week:   . Minutes of Exercise per Session:   Stress:   . Feeling of Stress :   Social Connections:   . Frequency of Communication with Friends and Family:   . Frequency of Social Gatherings with Friends and Family:   . Attends Religious Services:   . Active Member of Clubs or Organizations:   . Attends Archivist Meetings:   [name] Marital Status:   Intimate Partner Violence:   . Fear of Current or  Ex-Partner:   . Emotionally Abused:   [name] Physically Abused:   . Sexually Abused:      Family History  Problem Relation Age of Onset  . Dementia Mother   . Heart disease Father   . Colon cancer Neg Hx   . Prostate cancer Neg Hx   . Diabetes Neg Hx      Current Facility-Administered Medications:  .  0.9 %  sodium chloride infusion (Manually program via Guardrails IV Fluids), 250 mL, Intravenous, Once, Dew, Erskine Squibb, MD .  acetaminophen (TYLENOL) tablet 650 mg, 650 mg, Oral, Q6H PRN, Lucky Cowboy, Erskine Squibb, MD .  albuterol (PROVENTIL) (2.5 MG/3ML) 0.083% nebulizer solution 3 mL, 3 mL, Inhalation, Q4H PRN, Algernon Huxley, MD, 3 mL at 03/14/19 0517 .  [COMPLETED] amiodarone (PACERONE) tablet 400 mg, 400 mg, Oral, Once, 400 mg at 03/14/19 2109 **FOLLOWED  BY** [COMPLETED] amiodarone (PACERONE) tablet 200 mg, 200 mg, Oral, BID, 200 mg at 03/18/19 1100 **FOLLOWED BY** amiodarone (PACERONE) tablet 200 mg, 200 mg, Oral, Daily, Dew, Erskine Squibb, MD, 200 mg at 03/19/19 0928 .  apixaban (ELIQUIS) tablet 5 mg, 5 mg, Oral, BID, Nicole Kindred A, DO, 5 mg at 03/19/19 0929 .  aspirin EC tablet 81 mg, 81 mg, Oral, Daily, Algernon Huxley, MD, 81 mg at 03/19/19 0928 .  bisacodyl (DULCOLAX) EC tablet 5 mg, 5 mg, Oral, Daily PRN, Nicole Kindred A, DO .  chlorhexidine (PERIDEX) 0.12 % solution 15 mL, 15 mL, Mouth Rinse, BID, Dew, Erskine Squibb, MD, 15 mL at 03/19/19 0928 .  Chlorhexidine Gluconate Cloth 2 % PADS 6 each, 6 each, Topical, Q0600, Algernon Huxley, MD, 6 each at 03/19/19 0421 .  dextromethorphan-guaiFENesin (MUCINEX DM) 30-600 MG per 12 hr tablet 1 tablet, 1 tablet, Oral, BID, Dew, Erskine Squibb, MD, 1 tablet at 03/19/19 [phone]  dextrose 50 % solution 0-50 mL, 0-50 mL, Intravenous, PRN, Algernon Huxley, MD, 25 mL at 03/08/19 0355 .  dronabinol (MARINOL) capsule 5 mg, 5 mg, Oral, BID AC, Algernon Huxley, MD, 5 mg at 03/19/19 0928 .  DULoxetine (CYMBALTA) DR capsule 30 mg, 30 mg, Oral, Daily, Dew, Erskine Squibb, MD, 30 mg at 03/19/19 0929 .   ferrous sulfate tablet 325 mg, 325 mg, Oral, BID WC, Dew, Erskine Squibb, MD, 325 mg at 40/37/09 6438 .  folic acid (FOLVITE) tablet 1 mg, 1 mg, Oral, Daily, Dallie Piles, RPH, 1 mg at 03/19/19 3818 .  heparin lock flush 100 unit/mL, 500 Units, Intracatheter, Daily PRN, Algernon Huxley, MD .  heparin lock flush 100 unit/mL, 250 Units, Intracatheter, PRN, Algernon Huxley, MD .  HYDROmorphone (DILAUDID) injection 1 mg, 1 mg, Intravenous, Once PRN, Algernon Huxley, MD .  insulin aspart (novoLOG) injection 0-5 Units, 0-5 Units, Subcutaneous, QHS, Algernon Huxley, MD, 2 Units at 03/17/19 2154 .  insulin aspart (novoLOG) injection 0-9 Units, 0-9 Units, Subcutaneous, TID WC, Algernon Huxley, MD, 2 Units at 03/19/19 1219 .  insulin detemir (LEVEMIR) injection 20 Units, 20 Units, Subcutaneous, QHS, Algernon Huxley, MD, 20 Units at 03/18/19 2229 .  ipratropium-albuterol (DUONEB) 0.5-2.5 (3) MG/3ML nebulizer solution 3 mL, 3 mL, Nebulization, TID, Dew, Erskine Squibb, MD, 3 mL at 03/19/19 0736 .  MEDLINE mouth rinse, 15 mL, Mouth Rinse, q12n4p, Dew, Erskine Squibb, MD, 15 mL at 03/17/19 1757 .  menthol-cetylpyridinium (CEPACOL) lozenge 3 mg, 1 lozenge, Oral, PRN, Lucky Cowboy, Erskine Squibb, MD .  metoprolol succinate (TOPROL-XL) 24 hr tablet 12.5 mg, 12.5 mg, Oral, Daily, Agbor-Etang, Brian, MD, 12.5 mg at 03/19/19 0928 .  multivitamin (RENA-VIT) tablet 1 tablet, 1 tablet, Oral, QHS, Dew, Erskine Squibb, MD, 1 tablet at 03/18/19 2229 .  multivitamin with minerals tablet 1 tablet, 1 tablet, Oral, Daily, Dew, Erskine Squibb, MD, 1 tablet at 03/19/19 [phone]  nicotine (NICODERM CQ - dosed in mg/24 hours) patch 21 mg, 21 mg, Transdermal, Daily, Dew, Erskine Squibb, MD, 21 mg at 03/19/19 0928 .  ondansetron (ZOFRAN) injection 4 mg, 4 mg, Intravenous, Q8H PRN, Dew, Erskine Squibb, MD .  ondansetron (ZOFRAN) injection 4 mg, 4 mg, Intravenous, Q6H PRN, Dew, Erskine Squibb, MD .  oxymetazoline (AFRIN) 0.05 % nasal spray 1 spray, 1 spray, Each Nare, BID, Dew, Erskine Squibb, MD, 1 spray at 03/18/19 1000 .   piperacillin-tazobactam (ZOSYN) IVPB 3.375 g, 3.375 g, Intravenous, Q12H, Ezekiel Slocumb,  DO, Last Rate: 12.5 mL/hr at 03/19/19 0936, 3.375 g at 03/19/19 0936 .  pneumococcal 23 valent vaccine (PNEUMOVAX-23) injection 0.5 mL, 0.5 mL, Intramuscular, Prior to discharge, Sreenath, Sudheer B, MD .  polyethylene glycol (MIRALAX / GLYCOLAX) packet 17 g, 17 g, Oral, Daily, Dew, Erskine Squibb, MD, 17 g at 03/18/19 1101 .  potassium chloride SA (KLOR-CON) CR tablet 40 mEq, 40 mEq, Oral, Once, Nicole Kindred A, DO .  protein supplement (ENSURE MAX) liquid, 11 oz, Oral, BID BM, Algernon Huxley, MD, 11 oz at 03/18/19 1147 .  rosuvastatin (CRESTOR) tablet 10 mg, 10 mg, Oral, Daily, Dew, Erskine Squibb, MD, 10 mg at 03/19/19 0928 .  simethicone (MYLICON) chewable tablet 80 mg, 80 mg, Oral, QID PRN, Ezekiel Slocumb, DO, 80 mg at 03/19/19 1219 .  sodium chloride (OCEAN) 0.65 % nasal spray 1 spray, 1 spray, Each Nare, PRN, Algernon Huxley, MD, 1 spray at 03/11/19 0503 .  sodium chloride flush (NS) 0.9 % injection 10-40 mL, 10-40 mL, Intracatheter, Q12H, Dew, Erskine Squibb, MD, 10 mL at 03/19/19 0930 .  sodium chloride flush (NS) 0.9 % injection 10-40 mL, 10-40 mL, Intracatheter, PRN, Lucky Cowboy, Erskine Squibb, MD .  thiamine tablet 100 mg, 100 mg, Oral, Daily, Dallie Piles, RPH, 100 mg at 03/19/19 7829   Physical exam:  Vitals:   03/19/19 0422 03/19/19 0736 03/19/19 0817 03/19/19 1123  BP: 117/61  121/67 117/61  Pulse: 69  72 78  Resp:   17 17  Temp: 98 F (36.7 C)  98.2 F (36.8 C) 97.8 F (36.6 C)  TempSrc: Oral  Oral   SpO2: 98% 98% 100% 96%  Weight: 190 lb (86.2 kg)     Height:       Physical Exam  Constitutional: He is oriented to person, place, and time. No distress.  HENT:  Head: Normocephalic and atraumatic.  Eyes: Pupils are equal, round, and reactive to light. EOM are normal. No scleral icterus.  Cardiovascular: Normal rate and regular rhythm.  No murmur heard. Pulmonary/Chest: Effort normal.  Decreased breath sound  at the bases.  Few crackles on the right side.  Abdominal: Soft.  Musculoskeletal:        General: No edema. Normal range of motion.     Cervical back: Normal range of motion and neck supple.  Neurological: He is alert and oriented to person, place, and time.  Skin: Skin is warm and dry. No erythema.  Ecchymosis right upper extremity. right IJ permacath   Psychiatric: Affect normal.       CMP Latest Ref Rng & Units 03/19/2019  Glucose 70 - 99 mg/dL 206(H)  BUN 8 - 23 mg/dL 26(H)  Creatinine 0.61 - 1.24 mg/dL 2.45(H)  Sodium 135 - 145 mmol/L 138  Potassium 3.5 - 5.1 mmol/L 3.6  Chloride 98 - 111 mmol/L 100  CO2 22 - 32 mmol/L 29  Calcium 8.9 - 10.3 mg/dL 8.0(L)  Total Protein 6.5 - 8.1 g/dL -  Total Bilirubin 0.3 - 1.2 mg/dL -  Alkaline Phos 38 - 126 U/L -  AST 15 - 41 U/L -  ALT 0 - 44 U/L -   CBC Latest Ref Rng & Units 03/18/2019  WBC 4.0 - 10.5 K/uL 7.1  Hemoglobin 13.0 - 17.0 g/dL 8.6(L)  Hematocrit 39.0 - 52.0 % 26.8(L)  Platelets 150 - 400 K/uL 237    RADIOGRAPHIC STUDIES: I have personally reviewed the radiological images as listed and agreed with the findings in the report.  DG Chest 1 View  Result Date: 03/14/2019 CLINICAL DATA:  Wheezing EXAM: CHEST  1 VIEW COMPARISON:  03/03/2019 FINDINGS: Borderline heart size.  CABG. Diffuse interstitial opacity with Kerley lines and left more than right pleural effusion. No pneumothorax. IMPRESSION: CHF. Electronically Signed   By: Monte Fantasia M.D.   On: 03/14/2019 05:47   CT HEAD WO CONTRAST  Result Date: 03/05/2019 CLINICAL DATA:  Metastatic disease evaluation.  Lung cancer. EXAM: CT HEAD WITHOUT CONTRAST TECHNIQUE: Contiguous axial images were obtained from the base of the skull through the vertex without intravenous contrast. COMPARISON:  MRI head 09/20/2018 FINDINGS: Brain: Mild atrophy. Mild hypodensity in the white matter unchanged from the prior MRI. No acute infarct, hemorrhage, mass. No edema or midline shift.  Vascular: Negative for hyperdense vessel Skull: Negative skull. Sinuses/Orbits: Mucosal edema and bony thickening right maxillary sinus. Chronic nasal bone fracture. Left cataract extraction. Other: None IMPRESSION: No acute abnormality and negative for metastatic disease on unenhanced CT. Atrophy and mild chronic microvascular ischemia. Electronically Signed   By: Franchot Gallo M.D.   On: 03/05/2019 15:28   CT CHEST WO CONTRAST  Result Date: 03/14/2019 CLINICAL DATA:  Respiratory failure. EXAM: CT CHEST WITHOUT CONTRAST TECHNIQUE: Multidetector CT imaging of the chest was performed following the standard protocol without IV contrast. COMPARISON:  Chest CT dated 01/22/2019. FINDINGS: Cardiovascular: Heart size appears stable. No pericardial effusion. Diffuse coronary artery calcifications. Surgical changes of median sternotomy for presumed CABG. Mediastinum/Nodes: No mass or enlarged lymph nodes seen within the mediastinum. Esophagus appears normal. Trachea is unremarkable. Lungs/Pleura: New RIGHT pleural effusion, moderate to large in size. Associated compressive atelectasis within the RIGHT perihilar and lower lobe. New ill-defined consolidation within the LEFT lower lobe, suspected pneumonia. Probable additional rounded atelectasis within the lingula. Diffuse interstitial thickening indicating some degree of volume overload versus atypical infection. Upper Abdomen: Limited images of the upper abdomen are unremarkable. Musculoskeletal: No acute or suspicious osseous finding. IMPRESSION: 1. New ill-defined consolidation within the LEFT lower lobe, suspected pneumonia. 2. New moderate to large-sized RIGHT pleural effusion, with associated compressive atelectasis. 3. Diffuse interstitial thickening, most likely edema/fluid overload, less likely additional atypical infection. 4. Probable additional rounded atelectasis within the lingula. Aortic Atherosclerosis (ICD10-I70.0). Electronically Signed   By: Franki Cabot M.D.   On: 03/14/2019 19:47   US RENAL  Result Date: 03/10/2019 CLINICAL DATA:  64 year old presenting with acute renal failure. Evaluate for obstruction as a possible cause. EXAM: RENAL / URINARY TRACT ULTRASOUND COMPLETE COMPARISON:  Urinary tract ultrasound 03/04/2019. CT abdomen and pelvis 01/22/2019. FINDINGS: Right Kidney: Renal measurements: Approximately 12.6 x 6.4 x 6.2 cm = volume: 264 mL. Mildly echogenic parenchyma. No hydronephrosis. Well-preserved cortex. No shadowing calculi. No focal parenchymal abnormality. Left Kidney: Renal measurements: Approximately 12.5 x 6.3 x 6.9 cm = volume: 283 mL. Mildly echogenic parenchyma. No hydronephrosis. Well-preserved cortex. No shadowing calculi. No focal parenchymal abnormality. Bladder: Normal for degree of bladder distention. Other: None. IMPRESSION: 1. Mildly echogenic renal parenchyma indicative of medical renal disease. 2. Otherwise normal examination. Specifically, no evidence of urinary tract obstruction to account for the acute renal insufficiency. Electronically Signed   By: Evangeline Dakin M.D.   On: 03/10/2019 15:02   US RENAL  Result Date: 03/04/2019 CLINICAL DATA:  Acute kidney injury EXAM: RENAL / URINARY TRACT ULTRASOUND COMPLETE COMPARISON:  CT 01/22/2019 FINDINGS: Right Kidney: Renal measurements: 11.4 x 6 x 6 cm = volume: 215 mL . Echogenicity within normal limits. No mass or hydronephrosis visualized.  Left Kidney: Renal measurements: 11.8 x 7.3 x 6.5 cm = volume: 291.8 mL. Echogenicity within normal limits. No mass or hydronephrosis visualized. Bladder: Appears normal for degree of bladder distention. Other: Incidental note made of gallstones and small effusions. Heterogenous prostate. IMPRESSION: Normal ultrasound appearance of the kidneys Electronically Signed   By: Donavan Foil M.D.   On: 03/04/2019 15:37   PERIPHERAL VASCULAR CATHETERIZATION  Result Date: 03/14/2019 See op note  PERIPHERAL VASCULAR  CATHETERIZATION  Result Date: 03/11/2019 See op note  DG Chest Port 1 View  Result Date: 03/15/2019 CLINICAL DATA:  Status post thoracentesis. EXAM: PORTABLE CHEST 1 VIEW COMPARISON:  03/14/2019 FINDINGS: Interval decrease right pleural effusion. No evidence for pneumothorax. Bibasilar atelectasis/infiltrate again noted with persistent small left pleural effusion. The cardiopericardial silhouette is within normal limits for size. Pulmonary vascular congestion has decreased in the interval. Right IJ central line tip overlies the mid to distal SVC. The visualized bony structures of the thorax are intact. Telemetry leads overlie the chest. IMPRESSION: Interval decrease in right pleural effusion without evidence of pneumothorax. Decrease in pulmonary vascular congestion with persistent bibasilar atelectasis/infiltrate and small left pleural effusion. Electronically Signed   By: Misty Stanley M.D.   On: 03/15/2019 11:36   DG Chest Portable 1 View  Result Date: 03/03/2019 CLINICAL DATA:  Shortness of breath, since Monday worsening this morning around 1 a.m. EXAM: PORTABLE CHEST 1 VIEW COMPARISON:  12/19/2018 FINDINGS: Cardiomediastinal contours are stable following median sternotomy with persistent left-sided pleural effusion and pleural-parenchymal scarring. No new areas of consolidation or evidence of right-sided pleural effusion. No signs of acute bone process. IMPRESSION: Persistent left-sided pleural effusion and pleural-parenchymal scarring. Electronically Signed   By: Zetta Bills M.D.   On: 03/03/2019 13:59   ECHOCARDIOGRAM COMPLETE  Result Date: 03/15/2019    ECHOCARDIOGRAM REPORT   Patient Name:   THI SISEMORE Date of Exam: 03/15/2019 Medical Rec #:  [id number]       Height:       72.0 in Accession #:    [id number]      Weight:       201.4 lb Date of Birth:  [birth year]       BSA:          2.137 m Patient Age:    57 years        BP:           125/69 mmHg Patient Gender: M               HR:            83 bpm. Exam Location:  [facility] Procedure: 2D Echo, Cardiac Doppler and Color Doppler Indications:     Respiratory distress  History:         Patient has prior history of Echocardiogram examinations, most                  recent 03/04/2019. Prior CABG; Risk Factors:Hypertension and                  Diabetes.  Sonographer:     Sherrie Sport RDCS (AE) Referring Phys:  [id number] Claiborne Billings A GRIFFITH Diagnosing Phys: Kathlyn Sacramento MD IMPRESSIONS  1. Left ventricular ejection fraction, by estimation, is 35 to 40%. The left ventricle has moderately decreased function. The left ventricle demonstrates regional wall motion abnormalities . The left ventricular internal cavity size was mildly dilated. There is mild left ventricular hypertrophy. Left ventricular diastolic parameters are consistent with Grade  II diastolic dysfunction (pseudonormalization). There is moderate hypokinesis of the left ventricular, entire inferior wall.  2. Right ventricular systolic function is normal. The right ventricular size is normal. There is moderately elevated pulmonary artery systolic pressure. The estimated right ventricular systolic pressure is 95.2 mmHg.  3. Left atrial size was mildly dilated.  4. The mitral valve is normal in structure. Moderate mitral valve regurgitation. No evidence of mitral stenosis.  5. The aortic valve is abnormal. Aortic valve regurgitation is mild. Mild to moderate aortic valve sclerosis/calcification is present, without any evidence of aortic stenosis.  6. The inferior vena cava is normal in size with greater than 50% respiratory variability, suggesting right atrial pressure of 3 mmHg. FINDINGS  Left Ventricle: Left ventricular ejection fraction, by estimation, is 35 to 40%. The left ventricle has moderately decreased function. The left ventricle demonstrates regional wall motion abnormalities. Moderate hypokinesis of the left ventricular, entire inferior wall. The left ventricular internal cavity size was mildly  dilated. There is mild left ventricular hypertrophy. Left ventricular diastolic parameters are consistent with Grade II diastolic dysfunction (pseudonormalization). Right Ventricle: The right ventricular size is normal. No increase in right ventricular wall thickness. Right ventricular systolic function is normal. There is moderately elevated pulmonary artery systolic pressure. The tricuspid regurgitant velocity is 3.37 m/s, and with an assumed right atrial pressure of 3 mmHg, the estimated right ventricular systolic pressure is 84.1 mmHg. Left Atrium: Left atrial size was mildly dilated. Right Atrium: Right atrial size was normal in size. Pericardium: There is no evidence of pericardial effusion. Mitral Valve: The mitral valve is normal in structure. Normal mobility of the mitral valve leaflets. Moderate mitral valve regurgitation. No evidence of mitral valve stenosis. Tricuspid Valve: The tricuspid valve is normal in structure. Tricuspid valve regurgitation is mild . No evidence of tricuspid stenosis. Aortic Valve: The aortic valve is abnormal. Aortic valve regurgitation is mild. Mild to moderate aortic valve sclerosis/calcification is present, without any evidence of aortic stenosis. Aortic valve mean gradient measures 4.5 mmHg. Aortic valve peak gradient measures 8.4 mmHg. Aortic valve area, by VTI measures 2.54 cm. Pulmonic Valve: The pulmonic valve was normal in structure. Pulmonic valve regurgitation is not visualized. No evidence of pulmonic stenosis. Aorta: The aortic root is normal in size and structure. Venous: The inferior vena cava is normal in size with greater than 50% respiratory variability, suggesting right atrial pressure of 3 mmHg. IAS/Shunts: No atrial level shunt detected by color flow Doppler.  LEFT VENTRICLE PLAX 2D LVIDd:         5.34 cm      Diastology LVIDs:         4.38 cm      LV e' lateral:   5.77 cm/s LV PW:         1.00 cm      LV E/e' lateral: 22.4 LV IVS:        1.20 cm      LV e'  medial:    5.00 cm/s LVOT diam:     2.00 cm      LV E/e' medial:  25.8 LV SV:         63 LV SV Index:   30 LVOT Area:     3.14 cm  LV Volumes (MOD) LV vol d, MOD A2C: 227.0 ml LV vol d, MOD A4C: 148.0 ml LV vol s, MOD A2C: 142.0 ml LV vol s, MOD A4C: 110.0 ml LV SV MOD A2C:     85.0 ml LV  SV MOD A4C:     148.0 ml LV SV MOD BP:      59.1 ml RIGHT VENTRICLE RV Basal diam:  4.14 cm RV S prime:     10.30 cm/s TAPSE (M-mode): 3.2 cm LEFT ATRIUM             Index       RIGHT ATRIUM           Index LA diam:        4.80 cm 2.25 cm/m  RA Area:     15.70 cm LA Vol (A2C):   68.8 ml 32.20 ml/m RA Volume:   40.00 ml  18.72 ml/m LA Vol (A4C):   77.9 ml 36.45 ml/m LA Biplane Vol: 75.1 ml 35.14 ml/m  AORTIC VALVE                   PULMONIC VALVE AV Area (Vmax):    2.41 cm    PV Vmax:        0.99 m/s AV Area (Vmean):   2.15 cm    PV Peak grad:   3.9 mmHg AV Area (VTI):     2.54 cm    RVOT Peak grad: 4 mmHg AV Vmax:           144.50 cm/s AV Vmean:          94.950 cm/s AV VTI:            0.249 m AV Peak Grad:      8.4 mmHg AV Mean Grad:      4.5 mmHg LVOT Vmax:         111.00 cm/s LVOT Vmean:        65.100 cm/s LVOT VTI:          0.201 m LVOT/AV VTI ratio: 0.81  AORTA Ao Root diam: 2.90 cm MITRAL VALVE                TRICUSPID VALVE MV Area (PHT): 5.13 cm     TR Peak grad:   45.4 mmHg MV Decel Time: 148 msec     TR Vmax:        337.00 cm/s MV E velocity: 129.00 cm/s MV A velocity: 68.40 cm/s   SHUNTS MV E/A ratio:  1.89         Systemic VTI:  0.20 m                             Systemic Diam: 2.00 cm Kathlyn Sacramento MD Electronically signed by Kathlyn Sacramento MD Signature Date/Time: 03/15/2019/11:27:06 AM    Final    ECHOCARDIOGRAM COMPLETE  Result Date: 03/04/2019    ECHOCARDIOGRAM REPORT   Patient Name:   Tretha Sciara Date of Exam: 03/04/2019 Medical Rec #:  [id number]       Height:       72.0 in Accession #:    [id number]      Weight:       185.0 lb Date of Birth:  Nov 23, 1955       BSA:          2.061 m Patient Age:    28  years        BP:           125/72 mmHg Patient Gender: M               HR:  119 bpm. Exam Location:  [facility] Procedure: 2D Echo, Color Doppler and Cardiac Doppler Indications:     I21.4 NSTEMI  History:         Patient has prior history of Echocardiogram examinations, most                  recent 08/24/2018. CAD, Prior CABG; Risk Factors:Hypertension,                  Dyslipidemia and Diabetes. Lung Cancer.  Sonographer:     Charmayne Sheer RDCS (AE) Referring Phys:  [place] Phys: Kathlyn Sacramento MD IMPRESSIONS  1. Left ventricular ejection fraction, by estimation, is 30 to 35%. The left ventricle has moderately decreased function. The left ventricle demonstrates global hypokinesis. Left ventricular diastolic parameters are indeterminate.  2. Right ventricular systolic function is normal. The right ventricular size is normal. Tricuspid regurgitation signal is inadequate for assessing PA pressure.  3. Left atrial size was mild to moderately dilated.  4. The mitral valve is normal in structure and function. Moderate mitral valve regurgitation. No evidence of mitral stenosis.  5. The aortic valve is normal in structure and function. Aortic valve regurgitation is trivial. Mild to moderate aortic valve sclerosis/calcification is present, without any evidence of aortic stenosis.  6. The inferior vena cava is normal in size with greater than 50% respiratory variability, suggesting right atrial pressure of 3 mmHg. FINDINGS  Left Ventricle: Left ventricular ejection fraction, by estimation, is 30 to 35%. The left ventricle has moderately decreased function. The left ventricle demonstrates global hypokinesis. The left ventricular internal cavity size was normal in size. There is no left ventricular hypertrophy. Left ventricular diastolic parameters are indeterminate. Right Ventricle: The right ventricular size is normal. No increase in right ventricular wall thickness. Right ventricular systolic  function is normal. Tricuspid regurgitation signal is inadequate for assessing PA pressure. Left Atrium: Left atrial size was mild to moderately dilated. Right Atrium: Right atrial size was normal in size. Pericardium: There is no evidence of pericardial effusion. Mitral Valve: The mitral valve is normal in structure and function. Normal mobility of the mitral valve leaflets. Moderate mitral valve regurgitation. No evidence of mitral valve stenosis. MV peak gradient, 7.5 mmHg. The mean mitral valve gradient is 4.0  mmHg. Tricuspid Valve: The tricuspid valve is normal in structure. Tricuspid valve regurgitation is not demonstrated. No evidence of tricuspid stenosis. Aortic Valve: The aortic valve is normal in structure and function. Aortic valve regurgitation is trivial. Aortic regurgitation PHT measures 281 msec. Mild to moderate aortic valve sclerosis/calcification is present, without any evidence of aortic stenosis. Aortic valve mean gradient measures 5.0 mmHg. Aortic valve peak gradient measures 8.5 mmHg. Aortic valve area, by VTI measures 2.53 cm. Pulmonic Valve: The pulmonic valve was normal in structure. Pulmonic valve regurgitation is not visualized. No evidence of pulmonic stenosis. Aorta: The aortic root is normal in size and structure. Venous: The inferior vena cava is normal in size with greater than 50% respiratory variability, suggesting right atrial pressure of 3 mmHg. IAS/Shunts: No atrial level shunt detected by color flow Doppler.  LEFT VENTRICLE PLAX 2D LVIDd:         5.21 cm  Diastology LVIDs:         4.21 cm  LV e' lateral:   7.83 cm/s LV PW:         0.84 cm  LV E/e' lateral: 13.4 LV IVS:        0.89 cm  LV e' medial:    7.94 cm/s LVOT diam:     2.40 cm  LV E/e' medial:  13.2 LV SV:         54 LV SV Index:   26 LVOT Area:     4.52 cm  RIGHT VENTRICLE RV Basal diam:  3.08 cm LEFT ATRIUM             Index       RIGHT ATRIUM           Index LA diam:        4.70 cm 2.28 cm/m  RA Area:     14.10  cm LA Vol (A2C):   62.0 ml 30.08 ml/m RA Volume:   35.20 ml  17.08 ml/m LA Vol (A4C):   68.6 ml 33.29 ml/m LA Biplane Vol: 65.3 ml 31.68 ml/m  AORTIC VALVE                    PULMONIC VALVE AV Area (Vmax):    2.95 cm     PV Vmax:       1.10 m/s AV Area (Vmean):   2.86 cm     PV Vmean:      68.400 cm/s AV Area (VTI):     2.53 cm     PV VTI:        0.146 m AV Vmax:           146.00 cm/s  PV Peak grad:  4.8 mmHg AV Vmean:          103.000 cm/s PV Mean grad:  2.0 mmHg AV VTI:            0.213 m AV Peak Grad:      8.5 mmHg AV Mean Grad:      5.0 mmHg LVOT Vmax:         95.30 cm/s LVOT Vmean:        65.200 cm/s LVOT VTI:          0.119 m LVOT/AV VTI ratio: 0.56 AI PHT:            281 msec  AORTA Ao Root diam: 3.00 cm MITRAL VALVE MV Area (PHT): 3.92 cm     SHUNTS MV Peak grad:  7.5 mmHg     Systemic VTI:  0.12 m MV Mean grad:  4.0 mmHg     Systemic Diam: 2.40 cm MV Vmax:       1.37 m/s MV Vmean:      92.7 cm/s MV Decel Time: 194 msec MV E velocity: 104.80 cm/s Kathlyn Sacramento MD Electronically signed by Kathlyn Sacramento MD Signature Date/Time: 03/04/2019/2:46:43 PM    Final    [place] RT UPPER EXTREM LTD SOFT TISSUE NON VASCULAR  Result Date: 03/08/2019 CLINICAL DATA:  Swelling and bruising of the right upper extremity. Possible hematoma. EXAM: ULTRASOUND right UPPER EXTREMITY LIMITED TECHNIQUE: Ultrasound examination of the upper extremity soft tissues was performed in the area of clinical concern. COMPARISON:  None. FINDINGS: Diffuse subcutaneous soft tissue swelling/edema and streaky fluid. No discrete fluid collection or hematoma. The underlying musculature is grossly normal. IMPRESSION: Diffuse subcutaneous soft tissue swelling/edema/streaky fluid but no discrete abscess or hematoma. Electronically Signed   By: Marijo Sanes M.D.   On: 03/08/2019 19:13   US THORACENTESIS ASP PLEURAL SPACE W/IMG GUIDE  Result Date: 03/15/2019 INDICATION: History of lung cancer with recurrent right pleural effusion. Request for  diagnostic and therapeutic thoracentesis. EXAM: ULTRASOUND GUIDED RIGHT THORACENTESIS MEDICATIONS:  1% lidocaine 10 mL COMPLICATIONS: None immediate. PROCEDURE: An ultrasound guided thoracentesis was thoroughly discussed with the patient and questions answered. The benefits, risks, alternatives and complications were also discussed. The patient understands and wishes to proceed with the procedure. Written consent was obtained. Ultrasound was performed to localize and mark an adequate pocket of fluid in the right chest. The area was then prepped and draped in the normal sterile fashion. 1% Lidocaine was used for local anesthesia. Under ultrasound guidance a 6 Fr Safe-T-Centesis catheter was introduced. Thoracentesis was performed. The catheter was removed and a dressing applied. FINDINGS: A total of approximately 2.7 L of clear yellow fluid was removed. Samples were sent to the laboratory as requested by the clinical team. IMPRESSION: Successful ultrasound guided right thoracentesis yielding 2.7 L of pleural fluid. No pneumothorax on post-procedure chest x-ray. Read by: Gareth Eagle, PA-C Electronically Signed   By: Sandi Mariscal M.D.   On: 03/15/2019 12:44    Assessment and plan-  Patient is a 64 y.o. male with history of stage IV lung cancer, BRAF mutation on targeted therapy, hypertension, hyperlipidemia, CAD with history of CABG, diabetes, anxiety presented to emergency room due to breathing difficulties.he was found have DKA, AKI, mental status change.  #Acute renal failure, ATN, creatinine continues to rise.  Continue hemodialysis per nephrology.  Patient has right IJ permacath  #Anemia secondary to chronic kidney disease, I discussed with nephrology Dr. Juleen China.  Given that patient has noncurable stage IV lung cancer, recent ischemic cardiomyopathy, severe due to kidney failure, benefit of short-term erythropoietin overweighs potential risks.  Per nephrology, cardiology is also okay with erythropoietin  therapy.  Discussed with patient and wife.  Continue oral iron supplementation.  Ferrous sulfate 325 mg twice daily.   #Acute NSTEMI /atrial fibrillation with RVR, cardiology on board.  Acute CHF, ejection fraction decreased to 30%. Patient is on amiodarone, Eliquis 5 mg twice daily.  Aspirin, Crestor, beta-blocker.  Cardiology on board.  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    Hold off targeted therapy due to acute illness.  He may be a candidate of outpatient immunotherapy after he recovers from current acute illness.  Patient will follow up with me next week for posthospitalization follow-up and evaluation prior to immunotherapy.  #Right pleural effusion, status post thoracentesis.  Not meeting lights criteria, likely transudate.   Cytospin is negative for malignant cells.  #New ill-defined consolidation with the left lower lobe, suspect pneumonia.  Treated with Zosyn.   CODE STATUS, DNR/DNI. There is a plan for patient to go to rehab today, pending insurance approval. Thank you for allowing me to participate in the care of this patient.   Earlie Server, MD, PhD Hematology Oncology [facility] for initiation at [place]- [phone] 03/19/2019

## 2019-03-19 NOTE — TOC Progression Note (Signed)
Transition of Care [facility]) - Progression Note    Patient Details  Name: Craig Koch MRN: [id number] Date of Birth: [birth year]  Transition of Care [facility]) CM/SW Contact  Eileen Stanford, LCSW Phone Number: 03/19/2019, 9:42 AM  Clinical Narrative:  Pt's daughter requesting HCPOA ppwk be done before pt d/c. Consult placed for [place]. Pt's daughter will be at bedside at 10:00 Am.     Expected Discharge Plan: Home/Self Care Barriers to Discharge: Continued Medical Work up  Expected Discharge Plan and Services Expected Discharge Plan: Home/Self Care                                               Social Determinants of Health (SDOH) Interventions    Readmission Risk Interventions Readmission Risk Prevention Plan 03/06/2019  Transportation Screening Complete  PCP or Specialist Appt within 3-5 Days Complete  Medication Review (RN Care Manager) Complete  Some recent data might be hidden

## 2019-03-20 ENCOUNTER — Other Ambulatory Visit: Payer: Self-pay | Admitting: Oncology

## 2019-03-20 DIAGNOSIS — I480 Paroxysmal atrial fibrillation: Secondary | ICD-10-CM | POA: Diagnosis not present

## 2019-03-20 DIAGNOSIS — N179 Acute kidney failure, unspecified: Secondary | ICD-10-CM | POA: Diagnosis not present

## 2019-03-20 DIAGNOSIS — I5023 Acute on chronic systolic (congestive) heart failure: Secondary | ICD-10-CM | POA: Diagnosis not present

## 2019-03-20 DIAGNOSIS — N184 Chronic kidney disease, stage 4 (severe): Secondary | ICD-10-CM | POA: Diagnosis not present

## 2019-03-20 DIAGNOSIS — C3490 Malignant neoplasm of unspecified part of unspecified bronchus or lung: Secondary | ICD-10-CM | POA: Diagnosis not present

## 2019-03-20 DIAGNOSIS — N189 Chronic kidney disease, unspecified: Secondary | ICD-10-CM | POA: Diagnosis not present

## 2019-03-20 DIAGNOSIS — E1110 Type 2 diabetes mellitus with ketoacidosis without coma: Secondary | ICD-10-CM | POA: Diagnosis not present

## 2019-03-20 NOTE — Telephone Encounter (Signed)
Patient contacted regarding discharge from [facility] on 03/19/2019.  Patient understands to follow up with provider Gollan on 04/01/19 at 1120am at [facility]. Patient understands discharge instructions?  Patient understands medications and regiment?  Patient understands to bring all medications to this visit?   Ask patient:  Are you enrolled in My Chart?  If no ask patient if they would like to enroll.   Patient did not answer the phone but I did leave a message on his voicemail to please call us back.

## 2019-03-21 ENCOUNTER — Telehealth: Payer: Self-pay

## 2019-03-21 DIAGNOSIS — R531 Weakness: Secondary | ICD-10-CM | POA: Diagnosis not present

## 2019-03-21 DIAGNOSIS — N179 Acute kidney failure, unspecified: Secondary | ICD-10-CM | POA: Diagnosis not present

## 2019-03-21 DIAGNOSIS — J9601 Acute respiratory failure with hypoxia: Secondary | ICD-10-CM | POA: Diagnosis not present

## 2019-03-21 DIAGNOSIS — J189 Pneumonia, unspecified organism: Secondary | ICD-10-CM | POA: Diagnosis not present

## 2019-03-21 NOTE — Telephone Encounter (Signed)
Done. Lab/ MD/ Keytruda appts has been scheduled as requested. I will try to contact pt to make him aware.

## 2019-03-21 NOTE — Telephone Encounter (Signed)
Called x2 a detailed message was left on pt vmail making  him aware of his scheduled 03/29/19 appts for lab/MD/Keytruda.

## 2019-03-21 NOTE — Telephone Encounter (Signed)
-----   Message from Earlie Server, MD sent at 03/20/2019  9:50 PM EDT ----- He is discharged to Rehab. Please arrange him to follow up next week, lab MD [place]. Discontinue debrafenib and trimetinib.

## 2019-03-22 ENCOUNTER — Telehealth: Payer: Self-pay | Admitting: Family

## 2019-03-22 NOTE — Telephone Encounter (Signed)
Spoke with staff at [facility] to confirmed his new patient [facility] appointment with [place] on 3/23. Nurse stated patient is doing well by following a heart healthy diet, daily weights and no issues with medications or complaints of problems from the patient.    Alyse Low, [place]

## 2019-03-23 DIAGNOSIS — N184 Chronic kidney disease, stage 4 (severe): Secondary | ICD-10-CM | POA: Diagnosis not present

## 2019-03-23 DIAGNOSIS — N179 Acute kidney failure, unspecified: Secondary | ICD-10-CM | POA: Diagnosis not present

## 2019-03-23 DIAGNOSIS — N189 Chronic kidney disease, unspecified: Secondary | ICD-10-CM | POA: Diagnosis not present

## 2019-03-25 NOTE — Telephone Encounter (Signed)
2nd attempt at [facility].  No answer on first number and VM is full. No answer. Left message to call back on next number.

## 2019-03-26 ENCOUNTER — Emergency Department: Payer: Medicare Other

## 2019-03-26 ENCOUNTER — Other Ambulatory Visit: Payer: Self-pay

## 2019-03-26 ENCOUNTER — Telehealth: Payer: Self-pay | Admitting: Oncology

## 2019-03-26 ENCOUNTER — Ambulatory Visit: Payer: Medicare Other | Admitting: Family

## 2019-03-26 ENCOUNTER — Inpatient Hospital Stay
Admission: EM | Admit: 2019-03-26 | Discharge: 2019-04-01 | DRG: 180 | Disposition: A | Discharge status: Home Health | Payer: Medicare Other | Source: Other Acute Inpatient Hospital | Attending: Internal Medicine | Admitting: Internal Medicine

## 2019-03-26 DIAGNOSIS — D509 Iron deficiency anemia, unspecified: Secondary | ICD-10-CM | POA: Diagnosis not present

## 2019-03-26 DIAGNOSIS — E1122 Type 2 diabetes mellitus with diabetic chronic kidney disease: Secondary | ICD-10-CM | POA: Diagnosis not present

## 2019-03-26 DIAGNOSIS — N2581 Secondary hyperparathyroidism of renal origin: Secondary | ICD-10-CM | POA: Diagnosis not present

## 2019-03-26 DIAGNOSIS — F329 Major depressive disorder, single episode, unspecified: Secondary | ICD-10-CM | POA: Diagnosis present

## 2019-03-26 DIAGNOSIS — I480 Paroxysmal atrial fibrillation: Secondary | ICD-10-CM

## 2019-03-26 DIAGNOSIS — E1110 Type 2 diabetes mellitus with ketoacidosis without coma: Secondary | ICD-10-CM | POA: Diagnosis not present

## 2019-03-26 DIAGNOSIS — I10 Essential (primary) hypertension: Secondary | ICD-10-CM | POA: Diagnosis not present

## 2019-03-26 DIAGNOSIS — N170 Acute kidney failure with tubular necrosis: Secondary | ICD-10-CM | POA: Diagnosis present

## 2019-03-26 DIAGNOSIS — I132 Hypertensive heart and chronic kidney disease with heart failure and with stage 5 chronic kidney disease, or end stage renal disease: Secondary | ICD-10-CM | POA: Diagnosis present

## 2019-03-26 DIAGNOSIS — C3490 Malignant neoplasm of unspecified part of unspecified bronchus or lung: Secondary | ICD-10-CM | POA: Diagnosis not present

## 2019-03-26 DIAGNOSIS — Z20822 Contact with and (suspected) exposure to covid-19: Secondary | ICD-10-CM | POA: Diagnosis not present

## 2019-03-26 DIAGNOSIS — R0902 Hypoxemia: Secondary | ICD-10-CM | POA: Diagnosis not present

## 2019-03-26 DIAGNOSIS — E1165 Type 2 diabetes mellitus with hyperglycemia: Secondary | ICD-10-CM | POA: Diagnosis not present

## 2019-03-26 DIAGNOSIS — N186 End stage renal disease: Secondary | ICD-10-CM | POA: Diagnosis not present

## 2019-03-26 DIAGNOSIS — I252 Old myocardial infarction: Secondary | ICD-10-CM | POA: Diagnosis not present

## 2019-03-26 DIAGNOSIS — N189 Chronic kidney disease, unspecified: Secondary | ICD-10-CM | POA: Diagnosis not present

## 2019-03-26 DIAGNOSIS — F17210 Nicotine dependence, cigarettes, uncomplicated: Secondary | ICD-10-CM | POA: Diagnosis present

## 2019-03-26 DIAGNOSIS — Z7982 Long term (current) use of aspirin: Secondary | ICD-10-CM

## 2019-03-26 DIAGNOSIS — N179 Acute kidney failure, unspecified: Secondary | ICD-10-CM | POA: Diagnosis not present

## 2019-03-26 DIAGNOSIS — Z66 Do not resuscitate: Secondary | ICD-10-CM | POA: Diagnosis not present

## 2019-03-26 DIAGNOSIS — J9601 Acute respiratory failure with hypoxia: Secondary | ICD-10-CM | POA: Diagnosis present

## 2019-03-26 DIAGNOSIS — Z7952 Long term (current) use of systemic steroids: Secondary | ICD-10-CM

## 2019-03-26 DIAGNOSIS — D631 Anemia in chronic kidney disease: Secondary | ICD-10-CM | POA: Diagnosis present

## 2019-03-26 DIAGNOSIS — Z794 Long term (current) use of insulin: Secondary | ICD-10-CM

## 2019-03-26 DIAGNOSIS — F411 Generalized anxiety disorder: Secondary | ICD-10-CM | POA: Diagnosis present

## 2019-03-26 DIAGNOSIS — N184 Chronic kidney disease, stage 4 (severe): Secondary | ICD-10-CM | POA: Diagnosis not present

## 2019-03-26 DIAGNOSIS — J90 Pleural effusion, not elsewhere classified: Secondary | ICD-10-CM

## 2019-03-26 DIAGNOSIS — E785 Hyperlipidemia, unspecified: Secondary | ICD-10-CM | POA: Diagnosis present

## 2019-03-26 DIAGNOSIS — R600 Localized edema: Secondary | ICD-10-CM | POA: Diagnosis not present

## 2019-03-26 DIAGNOSIS — Z888 Allergy status to other drugs, medicaments and biological substances status: Secondary | ICD-10-CM

## 2019-03-26 DIAGNOSIS — I2510 Atherosclerotic heart disease of native coronary artery without angina pectoris: Secondary | ICD-10-CM | POA: Diagnosis not present

## 2019-03-26 DIAGNOSIS — Z743 Need for continuous supervision: Secondary | ICD-10-CM | POA: Diagnosis not present

## 2019-03-26 DIAGNOSIS — Z992 Dependence on renal dialysis: Secondary | ICD-10-CM

## 2019-03-26 DIAGNOSIS — I5043 Acute on chronic combined systolic (congestive) and diastolic (congestive) heart failure: Secondary | ICD-10-CM | POA: Diagnosis not present

## 2019-03-26 DIAGNOSIS — Z8249 Family history of ischemic heart disease and other diseases of the circulatory system: Secondary | ICD-10-CM

## 2019-03-26 DIAGNOSIS — Z818 Family history of other mental and behavioral disorders: Secondary | ICD-10-CM

## 2019-03-26 DIAGNOSIS — Z951 Presence of aortocoronary bypass graft: Secondary | ICD-10-CM

## 2019-03-26 DIAGNOSIS — E118 Type 2 diabetes mellitus with unspecified complications: Secondary | ICD-10-CM | POA: Diagnosis present

## 2019-03-26 DIAGNOSIS — Z7689 Persons encountering health services in other specified circumstances: Secondary | ICD-10-CM | POA: Diagnosis not present

## 2019-03-26 DIAGNOSIS — E876 Hypokalemia: Secondary | ICD-10-CM | POA: Diagnosis not present

## 2019-03-26 DIAGNOSIS — J189 Pneumonia, unspecified organism: Secondary | ICD-10-CM

## 2019-03-26 DIAGNOSIS — Z9842 Cataract extraction status, left eye: Secondary | ICD-10-CM

## 2019-03-26 DIAGNOSIS — R0602 Shortness of breath: Secondary | ICD-10-CM | POA: Diagnosis not present

## 2019-03-26 DIAGNOSIS — Z79899 Other long term (current) drug therapy: Secondary | ICD-10-CM

## 2019-03-26 DIAGNOSIS — Z7901 Long term (current) use of anticoagulants: Secondary | ICD-10-CM

## 2019-03-26 DIAGNOSIS — I959 Hypotension, unspecified: Secondary | ICD-10-CM | POA: Diagnosis not present

## 2019-03-26 LAB — CBC WITH DIFFERENTIAL/PLATELET
Abs Immature Granulocytes: 0.02 10*3/uL (ref 0.00–0.07)
Basophils Absolute: 0.1 10*3/uL (ref 0.0–0.1)
Basophils Relative: 2 %
Eosinophils Absolute: 0.3 10*3/uL (ref 0.0–0.5)
Eosinophils Relative: 7 %
HCT: 32.8 % — ABNORMAL LOW (ref 39.0–52.0)
Hemoglobin: 10.4 g/dL — ABNORMAL LOW (ref 13.0–17.0)
Immature Granulocytes: 1 %
Lymphocytes Relative: 14 %
Lymphs Abs: 0.6 10*3/uL — ABNORMAL LOW (ref 0.7–4.0)
MCH: 28.5 pg (ref 26.0–34.0)
MCHC: 31.7 g/dL (ref 30.0–36.0)
MCV: 89.9 fL (ref 80.0–100.0)
Monocytes Absolute: 0.6 10*3/uL (ref 0.1–1.0)
Monocytes Relative: 14 %
Neutro Abs: 2.5 10*3/uL (ref 1.7–7.7)
Neutrophils Relative %: 62 %
Platelets: 211 10*3/uL (ref 150–400)
RBC: 3.65 MIL/uL — ABNORMAL LOW (ref 4.22–5.81)
RDW: 18.3 % — ABNORMAL HIGH (ref 11.5–15.5)
WBC: 4 10*3/uL (ref 4.0–10.5)
nRBC: 0 % (ref 0.0–0.2)

## 2019-03-26 LAB — COMPREHENSIVE METABOLIC PANEL
ALT: 15 U/L (ref 0–44)
AST: 26 U/L (ref 15–41)
Albumin: 2.8 g/dL — ABNORMAL LOW (ref 3.5–5.0)
Alkaline Phosphatase: 114 U/L (ref 38–126)
Anion gap: 11 (ref 5–15)
BUN: 13 mg/dL (ref 8–23)
CO2: 28 mmol/L (ref 22–32)
Calcium: 8.4 mg/dL — ABNORMAL LOW (ref 8.9–10.3)
Chloride: 100 mmol/L (ref 98–111)
Creatinine, Ser: 1.82 mg/dL — ABNORMAL HIGH (ref 0.61–1.24)
GFR calc Af Amer: 44 mL/min — ABNORMAL LOW (ref >60)
GFR calc non Af Amer: 38 mL/min — ABNORMAL LOW (ref >60)
Glucose, Bld: 174 mg/dL — ABNORMAL HIGH (ref 70–99)
Potassium: 4 mmol/L (ref 3.5–5.1)
Sodium: 139 mmol/L (ref 135–145)
Total Bilirubin: 1 mg/dL (ref 0.3–1.2)
Total Protein: 7.2 g/dL (ref 6.5–8.1)

## 2019-03-26 LAB — TROPONIN I (HIGH SENSITIVITY)
Troponin I (High Sensitivity): 19 ng/L — ABNORMAL HIGH (ref <18)
Troponin I (High Sensitivity): 17 ng/L (ref <18)

## 2019-03-26 LAB — MRSA PCR SCREENING: MRSA by PCR: NEGATIVE

## 2019-03-26 LAB — PROCALCITONIN: Procalcitonin: <0.1 ng/mL

## 2019-03-26 LAB — RESPIRATORY PANEL BY RT PCR (FLU A&B, COVID)
Influenza A by PCR: NEGATIVE
Influenza B by PCR: NEGATIVE
SARS Coronavirus 2 by RT PCR: NEGATIVE

## 2019-03-26 LAB — GLUCOSE, CAPILLARY: Glucose-Capillary: 226 mg/dL — ABNORMAL HIGH (ref 70–99)

## 2019-03-26 LAB — BRAIN NATRIURETIC PEPTIDE: B Natriuretic Peptide: 475 pg/mL — ABNORMAL HIGH (ref 0.0–100.0)

## 2019-03-26 MED ORDER — FUROSEMIDE 10 MG/ML IJ SOLN
40.0000 mg | Freq: Two times a day (BID) | INTRAMUSCULAR | Status: DC
  Administered 2019-03-26 – 2019-03-27 (×2): 40 mg via INTRAVENOUS
  Filled 2019-03-26 (×2): qty 4

## 2019-03-26 MED ORDER — SODIUM CHLORIDE 0.9% FLUSH: 3.0000 mL | INTRAVENOUS | Status: DC | PRN

## 2019-03-26 MED ORDER — NICOTINE 21 MG/24HR TD PT24
21.0000 mg | MEDICATED_PATCH | Freq: Every day | TRANSDERMAL | Status: DC
  Filled 2019-03-26 (×3): qty 1

## 2019-03-26 MED ORDER — VANCOMYCIN HCL 1250 MG/250ML IV SOLN
1250.0000 mg | INTRAVENOUS | Status: DC
  Filled 2019-03-26: qty 250

## 2019-03-26 MED ORDER — SODIUM CHLORIDE 0.9 % IV SOLN
250.0000 mL | INTRAVENOUS | Status: DC | PRN
  Administered 2019-03-26 – 2019-03-27 (×2): 250 mL via INTRAVENOUS

## 2019-03-26 MED ORDER — INSULIN ASPART 100 UNIT/ML ~~LOC~~ SOLN
0.0000 [IU] | Freq: Every day | SUBCUTANEOUS | Status: DC
  Administered 2019-03-26 – 2019-03-31 (×3): 2 [IU] via SUBCUTANEOUS
  Filled 2019-03-26 (×3): qty 1

## 2019-03-26 MED ORDER — AMIODARONE HCL 200 MG PO TABS: 200.0000 mg | ORAL_TABLET | Freq: Every day | ORAL | Status: DC

## 2019-03-26 MED ORDER — ALBUTEROL SULFATE (2.5 MG/3ML) 0.083% IN NEBU: 5.0000 mg | INHALATION_SOLUTION | Freq: Once | RESPIRATORY_TRACT | Status: DC

## 2019-03-26 MED ORDER — SODIUM CHLORIDE 0.9 % IV SOLN: 500.0000 mg | INTRAVENOUS | Status: DC

## 2019-03-26 MED ORDER — FERROUS SULFATE 325 (65 FE) MG PO TABS
325.0000 mg | ORAL_TABLET | Freq: Two times a day (BID) | ORAL | Status: DC
  Administered 2019-03-27 – 2019-04-01 (×11): 325 mg via ORAL
  Filled 2019-03-26 (×11): qty 1

## 2019-03-26 MED ORDER — ROSUVASTATIN CALCIUM 10 MG PO TABS: 10.0000 mg | ORAL_TABLET | Freq: Every day | ORAL | Status: DC

## 2019-03-26 MED ORDER — SODIUM CHLORIDE 0.9 % IV SOLN
2.0000 g | INTRAVENOUS | Status: DC
  Administered 2019-03-27: 2 g via INTRAVENOUS
  Filled 2019-03-26: qty 20

## 2019-03-26 MED ORDER — APIXABAN 5 MG PO TABS
5.0000 mg | ORAL_TABLET | Freq: Two times a day (BID) | ORAL | Status: DC
  Administered 2019-03-26 – 2019-04-01 (×12): 5 mg via ORAL
  Filled 2019-03-26 (×12): qty 1

## 2019-03-26 MED ORDER — VANCOMYCIN HCL IN DEXTROSE 1-5 GM/200ML-% IV SOLN
1000.0000 mg | Freq: Once | INTRAVENOUS | Status: AC
  Administered 2019-03-26: 1000 mg via INTRAVENOUS
  Filled 2019-03-26: qty 200

## 2019-03-26 MED ORDER — ROSUVASTATIN CALCIUM 10 MG PO TABS
10.0000 mg | ORAL_TABLET | Freq: Every day | ORAL | Status: DC
  Administered 2019-03-27 – 2019-04-01 (×6): 10 mg via ORAL
  Filled 2019-03-26 (×6): qty 1

## 2019-03-26 MED ORDER — SODIUM CHLORIDE 0.9% FLUSH
3.0000 mL | Freq: Two times a day (BID) | INTRAVENOUS | Status: DC
  Administered 2019-03-26 – 2019-04-01 (×12): 3 mL via INTRAVENOUS

## 2019-03-26 MED ORDER — VANCOMYCIN HCL 750 MG/150ML IV SOLN
750.0000 mg | Freq: Once | INTRAVENOUS | Status: AC
  Administered 2019-03-26: 750 mg via INTRAVENOUS
  Filled 2019-03-26: qty 150

## 2019-03-26 MED ORDER — DULOXETINE HCL 30 MG PO CPEP: 30.0000 mg | ORAL_CAPSULE | Freq: Every day | ORAL | Status: DC

## 2019-03-26 MED ORDER — METOPROLOL SUCCINATE ER 25 MG PO TB24: 12.5000 mg | ORAL_TABLET | Freq: Every day | ORAL | Status: DC

## 2019-03-26 MED ORDER — ONDANSETRON HCL 4 MG/2ML IJ SOLN
4.0000 mg | Freq: Four times a day (QID) | INTRAMUSCULAR | Status: DC | PRN
  Administered 2019-03-30: 4 mg via INTRAVENOUS
  Filled 2019-03-26: qty 2

## 2019-03-26 MED ORDER — ACETAMINOPHEN 325 MG PO TABS: 650.0000 mg | ORAL_TABLET | ORAL | Status: DC | PRN

## 2019-03-26 MED ORDER — SODIUM CHLORIDE 0.9 % IV SOLN: 2.0000 g | INTRAVENOUS | Status: DC

## 2019-03-26 MED ORDER — AMIODARONE HCL 200 MG PO TABS
200.0000 mg | ORAL_TABLET | Freq: Every day | ORAL | Status: DC
  Administered 2019-03-27 – 2019-04-01 (×6): 200 mg via ORAL
  Filled 2019-03-26 (×6): qty 1

## 2019-03-26 MED ORDER — RENA-VITE PO TABS
1.0000 | ORAL_TABLET | Freq: Every day | ORAL | Status: DC
  Administered 2019-03-27 – 2019-03-31 (×5): 1 via ORAL
  Filled 2019-03-26 (×7): qty 1

## 2019-03-26 MED ORDER — DRONABINOL 2.5 MG PO CAPS
5.0000 mg | ORAL_CAPSULE | Freq: Two times a day (BID) | ORAL | Status: DC
  Administered 2019-03-27 – 2019-04-01 (×11): 5 mg via ORAL
  Filled 2019-03-26 (×11): qty 2

## 2019-03-26 MED ORDER — ONDANSETRON HCL 4 MG PO TABS: 8.0000 mg | ORAL_TABLET | Freq: Three times a day (TID) | ORAL | Status: DC | PRN

## 2019-03-26 MED ORDER — INSULIN ASPART 100 UNIT/ML ~~LOC~~ SOLN
0.0000 [IU] | Freq: Three times a day (TID) | SUBCUTANEOUS | Status: DC
  Administered 2019-03-27: 5 [IU] via SUBCUTANEOUS
  Administered 2019-03-27: 8 [IU] via SUBCUTANEOUS
  Administered 2019-03-27: 3 [IU] via SUBCUTANEOUS
  Administered 2019-03-28 (×2): 8 [IU] via SUBCUTANEOUS
  Administered 2019-03-29: 5 [IU] via SUBCUTANEOUS
  Administered 2019-03-29: 8 [IU] via SUBCUTANEOUS
  Administered 2019-03-29: 11 [IU] via SUBCUTANEOUS
  Administered 2019-03-30: 5 [IU] via SUBCUTANEOUS
  Administered 2019-03-30: 3 [IU] via SUBCUTANEOUS
  Administered 2019-03-31: 11 [IU] via SUBCUTANEOUS
  Administered 2019-03-31 – 2019-04-01 (×3): 3 [IU] via SUBCUTANEOUS
  Filled 2019-03-26 (×13): qty 1

## 2019-03-26 MED ORDER — FOLIC ACID 1 MG PO TABS
1.0000 mg | ORAL_TABLET | Freq: Every day | ORAL | Status: DC
  Administered 2019-03-27 – 2019-04-01 (×6): 1 mg via ORAL
  Filled 2019-03-26 (×6): qty 1

## 2019-03-26 MED ORDER — SODIUM CHLORIDE 0.9 % IV SOLN
500.0000 mg | INTRAVENOUS | Status: DC
  Administered 2019-03-26: 500 mg via INTRAVENOUS
  Filled 2019-03-26 (×2): qty 500

## 2019-03-26 MED ORDER — ASPIRIN EC 81 MG PO TBEC
81.0000 mg | DELAYED_RELEASE_TABLET | Freq: Every day | ORAL | Status: DC
  Administered 2019-03-27 – 2019-04-01 (×6): 81 mg via ORAL
  Filled 2019-03-26 (×6): qty 1

## 2019-03-26 MED ORDER — METOPROLOL SUCCINATE ER 25 MG PO TB24
12.5000 mg | ORAL_TABLET | Freq: Every day | ORAL | Status: DC
  Administered 2019-03-27 – 2019-04-01 (×6): 12.5 mg via ORAL
  Filled 2019-03-26 (×6): qty 1

## 2019-03-26 MED ORDER — SODIUM CHLORIDE 0.9 % IV SOLN
2.0000 g | Freq: Once | INTRAVENOUS | Status: AC
  Administered 2019-03-26: 2 g via INTRAVENOUS
  Filled 2019-03-26: qty 2

## 2019-03-26 MED ORDER — THIAMINE HCL 100 MG PO TABS
100.0000 mg | ORAL_TABLET | Freq: Every day | ORAL | Status: DC
  Administered 2019-03-27 – 2019-04-01 (×6): 100 mg via ORAL
  Filled 2019-03-26 (×7): qty 1

## 2019-03-26 MED ORDER — POLYETHYLENE GLYCOL 3350 17 G PO PACK
17.0000 g | PACK | Freq: Every day | ORAL | Status: DC
  Administered 2019-03-31 – 2019-04-01 (×2): 17 g via ORAL
  Filled 2019-03-26 (×2): qty 1

## 2019-03-26 MED ORDER — DULOXETINE HCL 30 MG PO CPEP
30.0000 mg | ORAL_CAPSULE | Freq: Every day | ORAL | Status: DC
  Administered 2019-03-27 – 2019-04-01 (×6): 30 mg via ORAL
  Filled 2019-03-26 (×6): qty 1

## 2019-03-26 MED ORDER — INFLUENZA VAC SPLIT QUAD 0.5 ML IM SUSY: 0.5000 mL | PREFILLED_SYRINGE | INTRAMUSCULAR | Status: DC

## 2019-03-26 NOTE — ED Triage Notes (Addendum)
Pt to ED via [facility] from [place] facility. Per EMS pt was getting SOB after finishing dialysis. Pt started dialysis 3 weeks ago. Per EMS RA sats 90%. Pt placed on 2L [place] with improvement to 99%. Pt diagnosed with stage 4 lung cancer  EMS VS: BP 140/83 O2 99% on 2L [place] HR 100  Upon arrival pt A&Ox4. Pt in NAD. Pt remains on 2L [place] with sats 100%. Pt is from [facility]. Charge Nurse # [phone] and clinic # [phone]

## 2019-03-26 NOTE — Consult Note (Signed)
Pharmacy Antibiotic Note  Craig Koch is a 64 y.o. male admitted on 03/26/2019 with pneumonia.  Pharmacy has been consulted for vancomycin dosing.Pt is also on ceftriaxone and azithromycin.   Plan: Will load the patient with vancomycin 1750 mg total followed by 1250 mg q24H. Predicted AUC 482. Goal AUC 400-550. Scr used 1.82. Plan to order level in 4-5 days.   MRSA PCR ordered. D/c vancomycin if negative.    Height: 6' (182.9 cm) Weight: 190 lb (86.2 kg) IBW/kg (Calculated) : 77.6  Temp (24hrs), Avg:98.6 F (37 C), Min:98.6 F (37 C), Max:98.6 F (37 C)  Recent Labs  Lab 03/26/19 1702  WBC 4.0  CREATININE 1.82*    Estimated Creatinine Clearance: 45 mL/min (A) (by C-G formula based on SCr of 1.82 mg/dL (H)).    Allergies  Allergen Reactions  . Lipitor [Atorvastatin Calcium] Other (See Comments)    Aches.  Tolerated crestor.     Antimicrobials this admission: 3/23 ceftriaxone >>  3/23 azithromycin >>  3/23 vancomycin >>   Dose adjustments this admission: None  Microbiology results: None   Thank you for allowing pharmacy to be a part of this patient's care.  Oswald Hillock 03/26/2019 8:04 PM

## 2019-03-26 NOTE — ED Notes (Signed)
Pt presents today following an episode of SOB following his dialysis treatment. Pt has only had 4 previous dialysis treatments and no history of SOB with dialysis.

## 2019-03-26 NOTE — ED Notes (Signed)
Provided pt w/ phone to call wife.

## 2019-03-26 NOTE — ED Provider Notes (Signed)
[facility] Emergency Department Provider Note  ____________________________________________   First MD Initiated Contact with Patient 03/26/19 1631     (approximate)  I have reviewed the triage vital signs and the nursing notes.  History  Chief Complaint Shortness of Breath    HPI Craig Koch is a 64 y.o. male with hx of ESRD, recently started on dialysis, CAD w/ hx of CABG, HFrEF, lung cancer, HTN, HLD, DM who presents for shortness of breath and hypoxia. He has had ongoing shortness of breath for the last few weeks, but this is acutely worsened today, especially after finishing dialysis.  Per EMS RA sats 90% placed on 2 L nasal cannula with improvement to upper 90s.  Patient has intermittently needed supplemental oxygen during recent hospitalizations, but does not normally wear it at baseline.  Also reports increased lower extremity swelling over the last week or so.  Denies any chest pain, fevers, cough, sick contacts.  No vomiting or diarrhea.  Patient recently had long admission from 2/28 to 3/16 for hypoxic respiratory failure due to HFrEF w/ volume overload as well as PNA and pleural effusion.    Past Medical Hx Past Medical History:  Diagnosis Date  . Anxiety   . Arthritis   . Coronary artery disease   . Diabetes mellitus   . Dyslipidemia   . Hx of CABG   . Hyperlipidemia   . Hypertension   . Malignant neoplasm of unspecified part of unspecified bronchus or lung ([place]) 08/2018   Immunotherapy  . Pleural effusion     Problem List Patient Active Problem List   Diagnosis Date Noted  . S/P thoracentesis   . Pleural effusion   . AKI (acute kidney injury) ([place])   . Acute systolic heart failure ([place])   . Pain of right upper extremity   . Microcytic anemia   . Protein-calorie malnutrition, severe 03/06/2019  . Acute alteration in mental status   . Palliative care encounter   . Atrial fibrillation with RVR ([place]) 03/03/2019  . DKA  (diabetic ketoacidoses) ([place]) 03/03/2019  . Diabetes mellitus without complication ([place]) 39/03/90  . Hypothermia 03/03/2019  . SOB (shortness of breath) 03/03/2019  . Acute renal failure ([place]) 03/03/2019  . NSTEMI (non-ST elevated myocardial infarction) ([place]) 03/03/2019  . Hyponatremia 02/08/2019  . Uncontrolled type 2 diabetes mellitus with hyperglycemia ([place]) 02/08/2019  . Leg swelling 02/08/2019  . Paronychia, finger 12/19/2018  . Encounter for antineoplastic chemotherapy 12/08/2018  . Tobacco abuse 10/12/2018  . Malignant pleural effusion   . Administrative encounter 09/23/2018  . lung adenocarcinoma 09/08/2018  . Goals of care, counseling/discussion 09/08/2018  . Recurrent pleural effusion on left 09/02/2018  . Elevated troponin 08/24/2018  . Medicare annual wellness visit, subsequent 08/07/2018  . Lower urinary tract symptoms (LUTS) 08/07/2018  . Neck pain 08/07/2018  . CAD (coronary artery disease), native coronary artery 09/23/2017  . Smoker 09/23/2017  . Chronic joint pain 10/18/2016  . Healthcare maintenance 07/17/2016  . Advance care planning 07/17/2016  . Fungal rash of torso 05/06/2016  . Trigger finger 05/06/2016  . History of alcohol use 11/17/2015  . Skin lesion 05/19/2015  . Anxiety state 02/19/2015  . Hand weakness 05/23/2011  . Diabetes mellitus with complication ([place]) 33/00/7622  . HLD (hyperlipidemia) 03/05/2010  . Essential hypertension 03/05/2010  . Coronary atherosclerosis 03/05/2010    Past Surgical Hx Past Surgical History:  Procedure Laterality Date  . CATARACT EXTRACTION Left 09/2015  . CHEST TUBE INSERTION Left 10/01/2018  Procedure: INSERTION PLEURAL DRAINAGE CATHETER;  Surgeon: [name], MD;  Location: [facility] ORS;  Service: Thoracic;  Laterality: Left;  . CORONARY ARTERY BYPASS GRAFT  2004   (CABG with LIMA to the  LAD, SVG to OM2/OM3, SVG  to diag  . DIALYSIS/PERMA CATHETER INSERTION N/A 03/14/2019   Procedure: DIALYSIS/PERMA CATHETER  INSERTION;  Surgeon: Algernon Huxley, MD;  Location: [facility];  Service: Cardiovascular;  Laterality: N/A;  . Left ankle surgery     repair of fracture  . Right lower leg surgery     rod  . TEMPORARY DIALYSIS CATHETER N/A 03/11/2019   Procedure: TEMPORARY DIALYSIS CATHETER;  Surgeon: Algernon Huxley, MD;  Location: [facility];  Service: Cardiovascular;  Laterality: N/A;    Medications Prior to Admission medications   Medication Sig Start Date End Date Taking? Authorizing Provider  acetaminophen (TYLENOL) 325 MG tablet Take 2 tablets (650 mg total) by mouth every 6 (six) hours as needed for mild pain or fever. 03/19/19   Ezekiel Slocumb, DO  albuterol (VENTOLIN HFA) 108 (90 Base) MCG/ACT inhaler Inhale 2 puffs into the lungs every 6 (six) hours as needed for wheezing or shortness of breath. 08/24/18   Loletha Grayer, MD  amiodarone (PACERONE) 200 MG tablet Take 1 tablet (200 mg total) by mouth daily. 03/20/19   Ezekiel Slocumb, DO  apixaban (ELIQUIS) 5 MG TABS tablet Take 1 tablet (5 mg total) by mouth 2 (two) times daily. 03/19/19   Ezekiel Slocumb, DO  aspirin EC 81 MG EC tablet Take 1 tablet (81 mg total) by mouth daily. 03/20/19   Nicole Kindred A, DO  BD INSULIN SYRINGE U/F 31G X 5/16" 0.5 ML MISC USE DAILY AS DIRECTED. DX E11.9 02/20/19   Tonia Ghent, MD  dextromethorphan-guaiFENesin [facility] DM) 30-600 MG 12hr tablet Take 1 tablet by mouth 2 (two) times daily. 03/19/19   Ezekiel Slocumb, DO  dronabinol (MARINOL) 5 MG capsule Take 1 capsule (5 mg total) by mouth 2 (two) times daily before a meal. 11/09/18   Earlie Server, MD  DULoxetine (CYMBALTA) 30 MG capsule TAKE 1 CAPSULE BY MOUTH EVERY DAY Patient taking differently: Take 30 mg by mouth daily.  02/06/19   Tonia Ghent, MD  Ensure Max Protein (ENSURE MAX PROTEIN) LIQD Take 330 mLs (11 oz total) by mouth 2 (two) times daily between meals. 03/19/19   Nicole Kindred A, DO  ferrous sulfate 325 (65 FE) MG tablet Take 1  tablet (325 mg total) by mouth 2 (two) times daily with a meal. 03/19/19   Ezekiel Slocumb, DO  folic acid (FOLVITE) 1 MG tablet Take 1 tablet (1 mg total) by mouth daily. 03/20/19   Nicole Kindred A, DO  glucose blood (ONE TOUCH ULTRA TEST) test strip USE TO TEST GLUCOSE LEVELS 4 (FOUR) TIMES DAILY. 02/14/19   Tonia Ghent, MD  insulin aspart (NOVOLOG) 100 UNIT/ML injection Inject 0-9 Units into the skin 3 (three) times daily with meals. Take 0 units if CBG 70-120 Take 1 unit if CBG 121-150 Take 2 units if CBG 151-200 Take 3 units if CBG 201-250 Take 5 units if CBG 251-300 Take 7 units if CBG 301-350 Take 9 units if CBG 351-400 If CBG > 400, give 12 units and call MD 03/19/19   Nicole Kindred A, DO  insulin detemir (LEVEMIR) 100 UNIT/ML injection Inject 0.2 mLs (20 Units total) into the skin at bedtime. 03/19/19   Ezekiel Slocumb,  DO  metoprolol succinate (TOPROL-XL) 25 MG 24 hr tablet Take 0.5 tablets (12.5 mg total) by mouth daily. 03/20/19   Ezekiel Slocumb, DO  multivitamin (RENA-VIT) TABS tablet Take 1 tablet by mouth at bedtime. 03/19/19   Ezekiel Slocumb, DO  nicotine (NICODERM CQ - DOSED IN MG/24 HOURS) 21 mg/24hr patch Place 1 patch (21 mg total) onto the skin daily. 03/20/19   Nicole Kindred A, DO  ondansetron (ZOFRAN) 8 MG tablet Take 1 tablet (8 mg total) by mouth every 8 (eight) hours as needed for nausea or vomiting. 11/16/18   Borders, Kirt Boys, NP  polyethylene glycol (MIRALAX / GLYCOLAX) 17 g packet Take 17 g by mouth daily. 03/20/19   Nicole Kindred A, DO  rosuvastatin (CRESTOR) 10 MG tablet TAKE 1 TABLET (10 MG TOTAL) BY MOUTH DAILY. 01/07/19   Tonia Ghent, MD  simethicone (MYLICON) 80 MG chewable tablet Chew 1 tablet (80 mg total) by mouth 4 (four) times daily as needed for flatulence. 03/19/19   Ezekiel Slocumb, DO  sodium chloride (OCEAN) 0.65 % SOLN nasal spray Place 1 spray into both nostrils as needed for congestion. 03/19/19   Nicole Kindred A, DO  thiamine  100 MG tablet Take 1 tablet (100 mg total) by mouth daily. 03/20/19   Nicole Kindred A, DO  triamcinolone cream (KENALOG) 0.1 % Apply 1 application topically 2 (two) times daily. 01/12/19   Earlie Server, MD    Allergies Lipitor [atorvastatin calcium]  Family Hx Family History  Problem Relation Age of Onset  . Dementia Mother   . Heart disease Father   . Colon cancer Neg Hx   . Prostate cancer Neg Hx   . Diabetes Neg Hx     Social Hx Social History   Tobacco Use  . Smoking status: Current Every Day Smoker    Packs/day: 0.20    Years: 45.00    Pack years: 9.00    Types: Cigarettes  . Smokeless tobacco: Former Systems developer    Types: Snuff  Substance Use Topics  . Alcohol use: Yes    Alcohol/week: 6.0 standard drinks    Types: 6 Cans of beer per week    Comment: occ, average 6 pack in a week  . Drug use: No     Review of Systems  Constitutional: Negative for fever, chills. Eyes: Negative for visual changes. ENT: Negative for sore throat. Cardiovascular: Negative for chest pain. Respiratory: Positive for shortness of breath. Gastrointestinal: Negative for nausea, vomiting.  Genitourinary: Negative for dysuria. Musculoskeletal: Negative for leg swelling. Skin: Negative for rash. Neurological: Negative for headaches.   Physical Exam  Vital Signs: ED Triage Vitals  Enc Vitals Group     BP 03/26/19 1632 136/80     Pulse Rate 03/26/19 1632 98     Resp 03/26/19 1632 (!) 22     Temp 03/26/19 1632 98.6 F (37 C)     Temp Source 03/26/19 1632 Oral     SpO2 03/26/19 1631 100 %     Weight 03/26/19 1633 190 lb (86.2 kg)     Height 03/26/19 1633 6' (1.829 m)     Head Circumference --      Peak Flow --      Pain Score 03/26/19 1632 0     Pain Loc --      Pain Edu? --      Excl. in [place]? --     Constitutional: Alert and oriented. Chronically ill appearing. Appears older than stated  age. Head: Normocephalic. Atraumatic. Eyes: Conjunctivae clear, sclera anicteric. Pupils equal  and symmetric. Nose: No masses or lesions. No congestion or rhinorrhea. Mouth/Throat: Wearing mask.  Neck: No stridor. Trachea midline.  Cardiovascular: Normal rate, regular rhythm. Extremities well perfused. Respiratory: Increased respiratory effort. On 2 L [place]. Coarse sounds bilaterally. Decreased BS at bases. Gastrointestinal: Soft. Non-distended. Non-tender.  Genitourinary: Deferred. Musculoskeletal: Bilateral lower extremity pitting edema to distal leg. No deformities. Neurologic:  Normal speech and language. No gross focal or lateralizing neurologic deficits are appreciated.  Skin: Skin is warm, dry and intact. No rash noted. Psychiatric: Mood and affect are appropriate for situation.  EKG  Personally reviewed and interpreted by myself.   Rate: 100 Rhythm: sinus Axis: normal Intervals: IVCD Non-specific ST/T changes No STEMI    Radiology  CXR  IMPRESSION:  Cardiomegaly with pulmonary vascular congestion. Left pleural  effusion. There is a degree of interstitial edema. Suspect a degree  of congestive heart failure. Consolidation in the medial left base  may represent alveolar edema or pneumonia. Both entities may be  present concurrently.   Central catheter tip is in the superior vena cava. Patient is status  post coronary artery bypass grafting.     Procedures  Procedure(s) performed (including critical care):  Procedures   Initial Impression / Assessment and Plan / MDM / ED Course  64 y.o. male who presents to the ED for SOB, leg swelling, hypoxia, new 2 L [place] requirement  Ddx: volume overload 2/2 HF +/- ESRD status, pulmonary infection, COVID, likely multifactorial   Work up reveals elevated BNP. XR with vascular congestion as well as consolidation medial left base which may be edema or PNA. Perhaps both and multifactorial. Will plan for antibiotics & likely diuretics/dialysis for PNA, volume overload respectively. With new O2 requirement will plan to admit  for further treatment and management. COVID pending. Procalcitonin sent for assistance with further differentiation. Discussed w/ hospitalist.   _______________________________  As part of my medical decision making I have reviewed available labs, radiology tests, reviewed old records.  Final Clinical Impression(s) / ED Diagnosis  Final diagnoses:  SOB (shortness of breath)  Hypoxia       Note:  This document was prepared using Dragon voice recognition software and may include unintentional dictation errors.   Lilia Pro., MD 03/27/19 [phone]

## 2019-03-26 NOTE — Consult Note (Signed)
PHARMACY -  BRIEF ANTIBIOTIC NOTE   Pharmacy has received consult(s) for vancomycin + cefepime from an ED provider.  The patient's profile has been reviewed for ht/wt/allergies/indication/available labs.    One time order(s) placed for  -Vancomycin 1 g (already ordered) -Cefepime 2 g (already ordered)  Further antibiotics/pharmacy consults should be ordered by admitting physician if indicated.                       Thank you, [place] Resident 03/26/2019  6:33 PM

## 2019-03-26 NOTE — H&P (Signed)
History and Physical    Craig Koch RCB:[id number] DOB: [birth year] DOA: 03/26/2019  PCP: Tonia Ghent, MD   Patient coming from: SNF I have personally briefly reviewed patient's old medical records in [place]  Chief Complaint: Shortness of breath  HPI: Craig Koch is a 64 y.o. male with medical history significant for stage IV lung cancer, ESRD on HD MWF, starting dialysis 3 weeks ago, paroxysmal A. fib , CAD with history of CABG, with recent NSTEMI, chronic combined systolic and diastolic heart failure, uncontrolled diabetes with last A1c 11.3, history of hypertension, recently hospitalized about 2 weeks prior for fluid overload and heart failure exacerbation, when dialysis was initiated, who presents to the emergency room after developing shortness of breath after his dialysis session today.  According to his wife who gives a lot of the history, allowing his recent discharge he had been coming along well but patient was noted to have progressing lower extremity edema.  He denies chest pain.  Has no fever and chills and no cough.  ED Course: On arrival to the emergency room his O2 sat was initially 90% on room air improving to 100% with 2 L and he had otherwise normal vitals.  WBC normal at 4 with hemoglobin 10.4, history is as expected for dialysis patient with potassium 4, bicarb 28.  BNP 475, troponin XIX.  EKG with nonspecific ST-T wave changes, chest x-ray showing pulmonary vascular congestion, left pleural effusion, interstitial edema, heart failure.  Also shows consolidation in left medial base which may represent alveolar edema or pneumonia.  Patient was started on IV antibiotics for possible HCAP.  Hospitalist consulted for admission.  Review of Systems: As per HPI otherwise 10 point review of systems negative.    Past Medical History:  Diagnosis Date  . Anxiety   . Arthritis   . Coronary artery disease   . Diabetes mellitus   . Dyslipidemia   . Hx of CABG   .  Hyperlipidemia   . Hypertension   . Malignant neoplasm of unspecified part of unspecified bronchus or lung ([place]) 08/2018   Immunotherapy  . Pleural effusion     Past Surgical History:  Procedure Laterality Date  . CATARACT EXTRACTION Left 09/2015  . CHEST TUBE INSERTION Left 10/01/2018   Procedure: INSERTION PLEURAL DRAINAGE CATHETER;  Surgeon: [name], MD;  Location: [facility] ORS;  Service: Thoracic;  Laterality: Left;  . CORONARY ARTERY BYPASS GRAFT  2004   (CABG with LIMA to the  LAD, SVG to OM2/OM3, SVG  to diag  . DIALYSIS/PERMA CATHETER INSERTION N/A 03/14/2019   Procedure: DIALYSIS/PERMA CATHETER INSERTION;  Surgeon: Algernon Huxley, MD;  Location: [place] [facility];  Service: Cardiovascular;  Laterality: N/A;  . Left ankle surgery     repair of fracture  . Right lower leg surgery     rod  . TEMPORARY DIALYSIS CATHETER N/A 03/11/2019   Procedure: TEMPORARY DIALYSIS CATHETER;  Surgeon: Algernon Huxley, MD;  Location: [place] [facility];  Service: Cardiovascular;  Laterality: N/A;     reports that he has been smoking cigarettes. He has a 9.00 pack-year smoking history. He has quit using smokeless tobacco.  His smokeless tobacco use included snuff. He reports current alcohol use of about 6.0 standard drinks of alcohol per week. He reports that he does not use drugs.  Allergies  Allergen Reactions  . Lipitor [Atorvastatin Calcium] Other (See Comments)    Aches.  Tolerated crestor.     Family  History  Problem Relation Age of Onset  . Dementia Mother   . Heart disease Father   . Colon cancer Neg Hx   . Prostate cancer Neg Hx   . Diabetes Neg Hx      Prior to Admission medications   Medication Sig Start Date End Date Taking? Authorizing Provider  acetaminophen (TYLENOL) 325 MG tablet Take 2 tablets (650 mg total) by mouth every 6 (six) hours as needed for mild pain or fever. 03/19/19   Ezekiel Slocumb, DO  albuterol (VENTOLIN HFA) 108 (90 Base) MCG/ACT inhaler Inhale 2  puffs into the lungs every 6 (six) hours as needed for wheezing or shortness of breath. 08/24/18   Loletha Grayer, MD  amiodarone (PACERONE) 200 MG tablet Take 1 tablet (200 mg total) by mouth daily. 03/20/19   Ezekiel Slocumb, DO  apixaban (ELIQUIS) 5 MG TABS tablet Take 1 tablet (5 mg total) by mouth 2 (two) times daily. 03/19/19   Ezekiel Slocumb, DO  aspirin EC 81 MG EC tablet Take 1 tablet (81 mg total) by mouth daily. 03/20/19   Nicole Kindred A, DO  BD INSULIN SYRINGE U/F 31G X 5/16" 0.5 ML MISC USE DAILY AS DIRECTED. DX E11.9 02/20/19   Tonia Ghent, MD  dextromethorphan-guaiFENesin [facility] DM) 30-600 MG 12hr tablet Take 1 tablet by mouth 2 (two) times daily. 03/19/19   Ezekiel Slocumb, DO  dronabinol (MARINOL) 5 MG capsule Take 1 capsule (5 mg total) by mouth 2 (two) times daily before a meal. 11/09/18   Earlie Server, MD  DULoxetine (CYMBALTA) 30 MG capsule TAKE 1 CAPSULE BY MOUTH EVERY DAY Patient taking differently: Take 30 mg by mouth daily.  02/06/19   Tonia Ghent, MD  Ensure Max Protein (ENSURE MAX PROTEIN) LIQD Take 330 mLs (11 oz total) by mouth 2 (two) times daily between meals. 03/19/19   Nicole Kindred A, DO  ferrous sulfate 325 (65 FE) MG tablet Take 1 tablet (325 mg total) by mouth 2 (two) times daily with a meal. 03/19/19   Ezekiel Slocumb, DO  folic acid (FOLVITE) 1 MG tablet Take 1 tablet (1 mg total) by mouth daily. 03/20/19   Nicole Kindred A, DO  glucose blood (ONE TOUCH ULTRA TEST) test strip USE TO TEST GLUCOSE LEVELS 4 (FOUR) TIMES DAILY. 02/14/19   Tonia Ghent, MD  insulin aspart (NOVOLOG) 100 UNIT/ML injection Inject 0-9 Units into the skin 3 (three) times daily with meals. Take 0 units if CBG 70-120 Take 1 unit if CBG 121-150 Take 2 units if CBG 151-200 Take 3 units if CBG 201-250 Take 5 units if CBG 251-300 Take 7 units if CBG 301-350 Take 9 units if CBG 351-400 If CBG > 400, give 12 units and call MD 03/19/19   Nicole Kindred A, DO  insulin detemir  (LEVEMIR) 100 UNIT/ML injection Inject 0.2 mLs (20 Units total) into the skin at bedtime. 03/19/19   Nicole Kindred A, DO  metoprolol succinate (TOPROL-XL) 25 MG 24 hr tablet Take 0.5 tablets (12.5 mg total) by mouth daily. 03/20/19   Ezekiel Slocumb, DO  multivitamin (RENA-VIT) TABS tablet Take 1 tablet by mouth at bedtime. 03/19/19   Ezekiel Slocumb, DO  nicotine (NICODERM CQ - DOSED IN MG/24 HOURS) 21 mg/24hr patch Place 1 patch (21 mg total) onto the skin daily. 03/20/19   Nicole Kindred A, DO  ondansetron (ZOFRAN) 8 MG tablet Take 1 tablet (8 mg total) by mouth every 8 (eight)  hours as needed for nausea or vomiting. 11/16/18   Borders, Kirt Boys, NP  polyethylene glycol (MIRALAX / GLYCOLAX) 17 g packet Take 17 g by mouth daily. 03/20/19   Nicole Kindred A, DO  rosuvastatin (CRESTOR) 10 MG tablet TAKE 1 TABLET (10 MG TOTAL) BY MOUTH DAILY. 01/07/19   Tonia Ghent, MD  simethicone (MYLICON) 80 MG chewable tablet Chew 1 tablet (80 mg total) by mouth 4 (four) times daily as needed for flatulence. 03/19/19   Ezekiel Slocumb, DO  sodium chloride (OCEAN) 0.65 % SOLN nasal spray Place 1 spray into both nostrils as needed for congestion. 03/19/19   Nicole Kindred A, DO  thiamine 100 MG tablet Take 1 tablet (100 mg total) by mouth daily. 03/20/19   Nicole Kindred A, DO  triamcinolone cream (KENALOG) 0.1 % Apply 1 application topically 2 (two) times daily. 01/12/19   Earlie Server, MD    Physical Exam: Vitals:   03/26/19 1633 03/26/19 1730 03/26/19 1800 03/26/19 1830  BP:  121/73 121/66 119/67  Pulse:  90 90 89  Resp:  (!) 22 (!) 21 (!) 22  Temp:      TempSrc:      SpO2:  100% 100% 100%  Weight: 86.2 kg     Height: 6' (1.829 m)        Vitals:   03/26/19 1633 03/26/19 1730 03/26/19 1800 03/26/19 1830  BP:  121/73 121/66 119/67  Pulse:  90 90 89  Resp:  (!) 22 (!) 21 (!) 22  Temp:      TempSrc:      SpO2:  100% 100% 100%  Weight: 86.2 kg     Height: 6' (1.829 m)       Constitutional:  Alert and awake, oriented x3, frail appearing but in o acute distress Eyes: PERLA, EOMI, irises appear normal, anicteric sclera,  ENMT: external ears and nose appear normal, normal hearing             Lips appears normal, oropharynx mucosa, tongue, posterior pharynx appear normal  Neck: neck appears normal, no masses, normal ROM, no thyromegaly, no JVD  CVS: S1-S2 clear, no murmur rubs or gallops,  , no carotid bruits, pedal pulses palpable,1-2+ pitting edema Respiratory: Diminished bilaterally, no wheezing, rales or rhonchi. Respiratory effort normal. No accessory muscle use.  Abdomen: soft nontender, nondistended, normal bowel sounds, no hepatosplenomegaly, no hernias Musculoskeletal: : no cyanosis, clubbing , no contractures or atrophy Neuro: Cranial nerves II-XII intact, sensation, reflexes normal, strength Psych: judgement and insight appear normal, stable mood and affect,  Skin: no rashes or lesions or ulcers, no induration or nodules   Labs on Admission: I have personally reviewed following labs and imaging studies  CBC: Recent Labs  Lab 03/26/19 1702  WBC 4.0  NEUTROABS 2.5  HGB 10.4*  HCT 32.8*  MCV 89.9  PLT 062   Basic Metabolic Panel: Recent Labs  Lab 03/26/19 1702  NA 139  K 4.0  CL 100  CO2 28  GLUCOSE 174*  BUN 13  CREATININE 1.82*  CALCIUM 8.4*   GFR: Estimated Creatinine Clearance: 45 mL/min (A) (by C-G formula based on SCr of 1.82 mg/dL (H)). Liver Function Tests: Recent Labs  Lab 03/26/19 1702  AST 26  ALT 15  ALKPHOS 114  BILITOT 1.0  PROT 7.2  ALBUMIN 2.8*   No results for input(s): LIPASE, AMYLASE in the last 168 hours. No results for input(s): AMMONIA in the last 168 hours. Coagulation Profile: No results for  input(s): INR, PROTIME in the last 168 hours. Cardiac Enzymes: No results for input(s): CKTOTAL, CKMB, CKMBINDEX, TROPONINI in the last 168 hours. BNP (last 3 results) No results for input(s): PROBNP in the last 8760 hours.  HbA1C: No results for input(s): HGBA1C in the last 72 hours. CBG: No results for input(s): GLUCAP in the last 168 hours. Lipid Profile: No results for input(s): CHOL, HDL, LDLCALC, TRIG, CHOLHDL, LDLDIRECT in the last 72 hours. Thyroid Function Tests: No results for input(s): TSH, T4TOTAL, FREET4, T3FREE, THYROIDAB in the last 72 hours. Anemia Panel: No results for input(s): VITAMINB12, FOLATE, FERRITIN, TIBC, IRON, RETICCTPCT in the last 72 hours. Urine analysis:    Component Value Date/Time   COLORURINE YELLOW (A) 03/17/2019 1940   APPEARANCEUR HAZY (A) 03/17/2019 1940   LABSPEC 1.017 03/17/2019 1940   PHURINE 5.0 03/17/2019 1940   GLUCOSEU 50 (A) 03/17/2019 1940   HGBUR NEGATIVE 03/17/2019 [place] NEGATIVE 03/17/2019 1940   KETONESUR 5 (A) 03/17/2019 1940   PROTEINUR 100 (A) 03/17/2019 1940   NITRITE NEGATIVE 03/17/2019 1940   LEUKOCYTESUR NEGATIVE 03/17/2019 1940    Radiological Exams on Admission: DG Chest 2 View  Result Date: 03/26/2019 CLINICAL DATA:  Shortness of breath EXAM: CHEST - 2 VIEW COMPARISON:  March 15, 2019 FINDINGS: Heart is mildly enlarged with pulmonary venous hypertension. There is a pleural effusion on the left. There is consolidation in the medial left base. There is mild interstitial edema present with atelectatic change in the left mid and lower lung zones. Patient is status post coronary artery bypass grafting. Central catheter tip is in the superior vena cava. No pneumothorax. No bone lesions. IMPRESSION: Cardiomegaly with pulmonary vascular congestion. Left pleural effusion. There is a degree of interstitial edema. Suspect a degree of congestive heart failure. Consolidation in the medial left base may represent alveolar edema or pneumonia. Both entities may be present concurrently. Central catheter tip is in the superior vena cava. Patient is status post coronary artery bypass grafting. Electronically Signed   By: Lowella Grip III M.D.   On:  03/26/2019 16:56    EKG: Independently reviewed.   Assessment/Plan Active Problems: Hypoxia -Patient with O2 sats of 90 on 2 L -Etiology likely multifactorial related to heart failure exacerbation, possible pneumonia as well as underlying stage IV lung cancer -We will treat each etiology outlined below    Acute on chronic combined systolic and diastolic CHF (congestive heart failure) (HCC) -IV Lasix 40 mg twice daily -Arrange for hemodialysis which should help with fluid overload -Not currently on beta-blockers, ACE inhibitor -Started on amiodarone recent hospitalization for paroxysmal A. fib  Possible HCAP (healthcare-associated pneumonia) -Patient recently treated for pneumonia during his hospitalization -Low suspicion for repeat pneumonia given afebrile and normal white cell count -IV Rocephin, azithromycin and vancomycin x1 -Procalcitonin ordered to determine need for continuation of antibiotics    CAD (coronary artery disease), native coronary artery   Hx of CABG, NSTEMI 03/04/2019 -Troponin was 19.  Continue to cycle -Patient denies chest pain and EKG shows no acute ST-T wave changes -Not on antiplatelets, likely secondary to apixaban started recent hospitalization, not currently on beta-blockers, likely secondary to amiodarone started recently  AF (paroxysmal atrial fibrillation) ([place]) -Patient was diagnosed with new onset paroxysmal A. fib and started on amiodarone and Eliquis earlier this month -Med rec from nursing home showing patient back on metoprolol and not amiodarone -Continue metoprolol.  Holding amiodarone until clarified    Diabetes mellitus with complication (HCC) -Hemoglobin A1c  done earlier this month on 03/04/2019 was 11.3 -Sliding scale insulin coverage    Essential hypertension -Blood pressure stable continue home meds    Stage 4 lung cancer ([place]) -No acute concerns.  Continue outpatient follow-up with oncology     DVT prophylaxis: Lovenox  Code  Status: dnr Family Communication:  Wife Economist Disposition Plan: Back to previous home environment Consults called: renal Status:inp    Athena Masse MD [facility]     03/26/2019, 7:32 PM

## 2019-03-26 NOTE — ED Notes (Signed)
Replace dressing on IV, wife at bedside at this time.

## 2019-03-26 NOTE — Telephone Encounter (Signed)
Patient's wife phoned and stated that patient recently went to [facility] for rehab and was not sure if Isai[facility]man was aware of appts. Writer phoned [employer] and informed Diane, transportation, of appts.

## 2019-03-27 DIAGNOSIS — Z992 Dependence on renal dialysis: Secondary | ICD-10-CM

## 2019-03-27 DIAGNOSIS — J90 Pleural effusion, not elsewhere classified: Secondary | ICD-10-CM

## 2019-03-27 DIAGNOSIS — N186 End stage renal disease: Secondary | ICD-10-CM

## 2019-03-27 DIAGNOSIS — I5043 Acute on chronic combined systolic (congestive) and diastolic (congestive) heart failure: Secondary | ICD-10-CM

## 2019-03-27 LAB — CBC
HCT: 30.2 % — ABNORMAL LOW (ref 39.0–52.0)
Hemoglobin: 9.2 g/dL — ABNORMAL LOW (ref 13.0–17.0)
MCH: 28.4 pg (ref 26.0–34.0)
MCHC: 30.5 g/dL (ref 30.0–36.0)
MCV: 93.2 fL (ref 80.0–100.0)
Platelets: 248 10*3/uL (ref 150–400)
RBC: 3.24 MIL/uL — ABNORMAL LOW (ref 4.22–5.81)
RDW: 18.4 % — ABNORMAL HIGH (ref 11.5–15.5)
WBC: 4.4 10*3/uL (ref 4.0–10.5)
nRBC: 0 % (ref 0.0–0.2)

## 2019-03-27 LAB — GLUCOSE, CAPILLARY
Glucose-Capillary: 229 mg/dL — ABNORMAL HIGH (ref 70–99)
Glucose-Capillary: 194 mg/dL — ABNORMAL HIGH (ref 70–99)
Glucose-Capillary: 286 mg/dL — ABNORMAL HIGH (ref 70–99)
Glucose-Capillary: 244 mg/dL — ABNORMAL HIGH (ref 70–99)

## 2019-03-27 LAB — BASIC METABOLIC PANEL
Anion gap: 15 (ref 5–15)
BUN: 18 mg/dL (ref 8–23)
CO2: 24 mmol/L (ref 22–32)
Calcium: 8.4 mg/dL — ABNORMAL LOW (ref 8.9–10.3)
Chloride: 100 mmol/L (ref 98–111)
Creatinine, Ser: 2.13 mg/dL — ABNORMAL HIGH (ref 0.61–1.24)
GFR calc Af Amer: 37 mL/min — ABNORMAL LOW (ref >60)
GFR calc non Af Amer: 32 mL/min — ABNORMAL LOW (ref >60)
Glucose, Bld: 246 mg/dL — ABNORMAL HIGH (ref 70–99)
Potassium: 4 mmol/L (ref 3.5–5.1)
Sodium: 139 mmol/L (ref 135–145)

## 2019-03-27 LAB — HIV ANTIBODY (ROUTINE TESTING W REFLEX): HIV Screen 4th Generation wRfx: NONREACTIVE

## 2019-03-27 LAB — HEMOGLOBIN A1C
Hgb A1c MFr Bld: 8.8 % — ABNORMAL HIGH (ref 4.8–5.6)
Mean Plasma Glucose: 205.86 mg/dL

## 2019-03-27 MED ORDER — SALINE SPRAY 0.65 % NA SOLN
1.0000 | NASAL | Status: DC | PRN
  Filled 2019-03-27: qty 44

## 2019-03-27 MED ORDER — CHLORHEXIDINE GLUCONATE CLOTH 2 % EX PADS
6.0000 | MEDICATED_PAD | Freq: Every morning | CUTANEOUS | Status: DC
  Administered 2019-03-27 – 2019-04-01 (×6): 6 via TOPICAL

## 2019-03-27 MED ORDER — NEPRO/CARBSTEADY PO LIQD
237.0000 mL | Freq: Two times a day (BID) | ORAL | Status: DC
  Administered 2019-03-27 – 2019-04-01 (×3): 237 mL via ORAL

## 2019-03-27 MED ORDER — FUROSEMIDE 10 MG/ML IJ SOLN
80.0000 mg | Freq: Two times a day (BID) | INTRAMUSCULAR | Status: DC
  Administered 2019-03-27 – 2019-03-29 (×4): 80 mg via INTRAVENOUS
  Filled 2019-03-27 (×4): qty 8

## 2019-03-27 NOTE — Progress Notes (Signed)
New hemodialysis patient, recently started at [facility] the last admission. Patient was discharged to [facility], who provides transportation to treatments. Patient chair time is TTS 11:30am.

## 2019-03-27 NOTE — Progress Notes (Signed)
Inpatient Diabetes Program Recommendations  AACE/ADA: New Consensus Statement on Inpatient Glycemic Control   Target Ranges:  Prepandial:   less than 140 mg/dL      Peak postprandial:   less than 180 mg/dL (1-2 hours)      Critically ill patients:  140 - 180 mg/dL   Results for Craig Koch, Craig Koch (MRN [id number]) as of 03/27/2019 11:56  Ref. Range 03/26/2019 21:27 03/27/2019 07:57 03/27/2019 11:39  Glucose-Capillary Latest Ref Range: 70 - 99 mg/dL 226 (H) 229 (H) 194 (H)  Results for Craig Koch, Craig Koch (MRN [id number]) as of 03/27/2019 11:56  Ref. Range 03/04/2019 10:59 03/26/2019 21:55  Hemoglobin A1C Latest Ref Range: 4.8 - 5.6 % 11.3 (H) 8.8 (H)   Review of Glycemic Control  Diabetes history: DM2 Outpatient Diabetes medications: Levemir 20 units QHS, Novolog 0-9 units TID with meals Current orders for Inpatient glycemic control: Novolog 0-15 units TID with meals, Novolog 0-5 units QHS  Inpatient Diabetes Program Recommendations:   Insulin - Basal: Please consider ordering Levemir 15 units daily.  NOTE: Inpatient Diabetes Coordinator last spoke with patient on 03/04/19 during last hospital admission.  Thanks, Barnie Alderman, RN, MSN, CDE Diabetes Coordinator Inpatient Diabetes Program [phone] (Team Pager from 8am to 5pm)

## 2019-03-27 NOTE — Plan of Care (Signed)
  Problem: Education: Goal: Ability to demonstrate management of disease process will improve Outcome: Progressing   Problem: Education: Goal: Knowledge of General Education information will improve Description: Including pain rating scale, medication(s)/side effects and non-pharmacologic comfort measures Outcome: Progressing Note: Patient profile completed. No complaints of pain. Patient is dyspneic at rest. Patient is receiving antibiotic therapy. Will continue to monitor.   Problem: Education: Goal: Knowledge of General Education information will improve Description: Including pain rating scale, medication(s)/side effects and non-pharmacologic comfort measures Outcome: Progressing Note: Patient profile completed. No complaints of pain. Patient is dyspneic at rest. Patient is receiving antibiotic therapy. Will continue to monitor.

## 2019-03-27 NOTE — Progress Notes (Signed)
[facility], [place] 03/27/19  Subjective:   LOS: 1 03/23 0701 - 03/24 0700 In: 614.8 [I.V.:14.8; IV Piggyback:600] Out: 500 [Urine:500]  Patient known to our practice from outpatient dialysis He presented to the emergency room via [facility] from dialysis.  He was sent for shortness of breath after finishing dialysis.  He reports as his sats were low which improved with oxygen.  He also reports increasing lower extremity edema This morning, he is sitting up at the side of the bed and overall feels a little better.  No acute shortness of breath.  Continues to have lower extremity edema.  Patient reports good urine output.  Objective:  Vital signs in last 24 hours:  Temp:  [97.7 F (36.5 C)-98.6 F (37 C)] 97.7 F (36.5 C) (03/24 0753) Pulse Rate:  [89-103] 102 (03/24 0753) Resp:  [18-25] 18 (03/24 0753) BP: (119-136)/(66-83) 130/67 (03/24 0753) SpO2:  [100 %] 100 % (03/24 0753) Weight:  [86.2 kg-88.7 kg] 88.4 kg (03/24 0412)  Weight change:  Filed Weights   03/26/19 1633 03/26/19 2048 03/27/19 0412  Weight: 86.2 kg 88.7 kg 88.4 kg    Intake/Output:    Intake/Output Summary (Last 24 hours) at 03/27/2019 1011 Last data filed at 03/27/2019 1006 Gross per 24 hour  Intake 854.78 ml  Output 500 ml  Net 354.78 ml     Physical Exam: General:  No acute distress, sitting up on the side of the bed  HEENT  anicteric, moist oral mucous membrane  Pulm/lungs  normal breathing effort, decreased BS at bases, [place] O2  CVS/Heart  irregular, tachycardic  Abdomen:   Soft, nontender  Extremities:  1-2+ pitting edema bilaterally  Neurologic:  Alert, oriented  Skin:  No acute rashes  Access:  PermCath       Basic Metabolic Panel:  Recent Labs  Lab 03/26/19 1702 03/27/19 0532  NA 139 139  K 4.0 4.0  CL 100 100  CO2 28 24  GLUCOSE 174* 246*  BUN 13 18  CREATININE 1.82* 2.13*  CALCIUM 8.4* 8.4*     CBC: Recent Labs  Lab 03/26/19 1702  03/27/19 0532  WBC 4.0 4.4  NEUTROABS 2.5  --   HGB 10.4* 9.2*  HCT 32.8* 30.2*  MCV 89.9 93.2  PLT 211 248      Lab Results  Component Value Date   HEPBSAG NON REACTIVE 03/11/2019   HEPBIGM NON REACTIVE 03/11/2019      Microbiology:  Recent Results (from the past 240 hour(s))  Respiratory Panel by RT PCR (Flu A&B, Covid) - Nasopharyngeal Swab     Status: None   Collection Time: 03/19/19 10:41 AM   Specimen: Nasopharyngeal Swab  Result Value Ref Range Status   SARS Coronavirus 2 by RT PCR NEGATIVE NEGATIVE Final    Comment: (NOTE) SARS-CoV-2 target nucleic acids are NOT DETECTED. The SARS-CoV-2 RNA is generally detectable in upper respiratoy specimens during the acute phase of infection. The lowest concentration of SARS-CoV-2 viral copies this assay can detect is 131 copies/mL. A negative result does not preclude SARS-Cov-2 infection and should not be used as the sole basis for treatment or other patient management decisions. A negative result may occur with  improper specimen collection/handling, submission of specimen other than nasopharyngeal swab, presence of viral mutation(s) within the areas targeted by this assay, and inadequate number of viral copies (<131 copies/mL). A negative result must be combined with clinical observations, patient history, and epidemiological information. The expected result is  Negative. Fact Sheet for Patients:  PinkCheek.be Fact Sheet for Healthcare Providers:  GravelBags.it This test is not yet ap proved or cleared by the [place] FDA and  has been authorized for detection and/or diagnosis of SARS-CoV-2 by FDA under an Emergency Use Authorization (EUA). This EUA will remain  in effect (meaning this test can be used) for the duration of the COVID-19 declaration under Section 564(b)(1) of the Act, 21 U.S.C. section 360bbb-3(b)(1), unless the authorization is terminated  or revoked sooner.    Influenza A by PCR NEGATIVE NEGATIVE Final   Influenza B by PCR NEGATIVE NEGATIVE Final    Comment: (NOTE) The Xpert Xpress SARS-CoV-2/FLU/RSV assay is intended as an aid in  the diagnosis of influenza from Nasopharyngeal swab specimens and  should not be used as a sole basis for treatment. Nasal washings and  aspirates are unacceptable for Xpert Xpress SARS-CoV-2/FLU/RSV  testing. Fact Sheet for Patients: PinkCheek.be Fact Sheet for Healthcare Providers: GravelBags.it This test is not yet approved or cleared by the [place] FDA and  has been authorized for detection and/or diagnosis of SARS-CoV-2 by  FDA under an Emergency Use Authorization (EUA). This EUA will remain  in effect (meaning this test can be used) for the duration of the  Covid-19 declaration under Section 564(b)(1) of the Act, 21  U.S.C. section 360bbb-3(b)(1), unless the authorization is  terminated or revoked. Performed at [facility], [address] [place], [place] [address]   Respiratory Panel by RT PCR (Flu A&B, Covid) - Nasopharyngeal Swab     Status: None   Collection Time: 03/26/19  6:40 PM   Specimen: Nasopharyngeal Swab  Result Value Ref Range Status   SARS Coronavirus 2 by RT PCR NEGATIVE NEGATIVE Final    Comment: (NOTE) SARS-CoV-2 target nucleic acids are NOT DETECTED. The SARS-CoV-2 RNA is generally detectable in upper respiratoy specimens during the acute phase of infection. The lowest concentration of SARS-CoV-2 viral copies this assay can detect is 131 copies/mL. A negative result does not preclude SARS-Cov-2 infection and should not be used as the sole basis for treatment or other patient management decisions. A negative result may occur with  improper specimen collection/handling, submission of specimen other than nasopharyngeal swab, presence of viral mutation(s) within the areas targeted by this  assay, and inadequate number of viral copies (<131 copies/mL). A negative result must be combined with clinical observations, patient history, and epidemiological information. The expected result is Negative. Fact Sheet for Patients:  PinkCheek.be Fact Sheet for Healthcare Providers:  GravelBags.it This test is not yet ap proved or cleared by the [place] FDA and  has been authorized for detection and/or diagnosis of SARS-CoV-2 by FDA under an Emergency Use Authorization (EUA). This EUA will remain  in effect (meaning this test can be used) for the duration of the COVID-19 declaration under Section 564(b)(1) of the Act, 21 U.S.C. section 360bbb-3(b)(1), unless the authorization is terminated or revoked sooner.    Influenza A by PCR NEGATIVE NEGATIVE Final   Influenza B by PCR NEGATIVE NEGATIVE Final    Comment: (NOTE) The Xpert Xpress SARS-CoV-2/FLU/RSV assay is intended as an aid in  the diagnosis of influenza from Nasopharyngeal swab specimens and  should not be used as a sole basis for treatment. Nasal washings and  aspirates are unacceptable for Xpert Xpress SARS-CoV-2/FLU/RSV  testing. Fact Sheet for Patients: PinkCheek.be Fact Sheet for Healthcare Providers: GravelBags.it This test is not yet approved or cleared by the [place] and  has been authorized for detection and/or diagnosis of SARS-CoV-2 by  FDA under an Emergency Use Authorization (EUA). This EUA will remain  in effect (meaning this test can be used) for the duration of the  Covid-19 declaration under Section 564(b)(1) of the Act, 21  U.S.C. section 360bbb-3(b)(1), unless the authorization is  terminated or revoked. Performed at [facility], [address] [place], [place] [address]   MRSA PCR Screening     Status: None   Collection Time: 03/26/19  8:03 PM   Specimen: Nasal  Mucosa; Nasopharyngeal  Result Value Ref Range Status   MRSA by PCR NEGATIVE NEGATIVE Final    Comment:        The GeneXpert MRSA Assay (FDA approved for NASAL specimens only), is one component of a comprehensive MRSA colonization surveillance program. It is not intended to diagnose MRSA infection nor to guide or monitor treatment for MRSA infections. Performed at [facility], [address] [place], [place] [address]     Coagulation Studies: No results for input(s): LABPROT, INR in the last 72 hours.  Urinalysis: No results for input(s): COLORURINE, LABSPEC, PHURINE, GLUCOSEU, HGBUR, BILIRUBINUR, KETONESUR, PROTEINUR, UROBILINOGEN, NITRITE, LEUKOCYTESUR in the last 72 hours.  Invalid input(s): APPERANCEUR    Imaging: DG Chest 2 View  Result Date: 03/26/2019 CLINICAL DATA:  Shortness of breath EXAM: CHEST - 2 VIEW COMPARISON:  March 15, 2019 FINDINGS: Heart is mildly enlarged with pulmonary venous hypertension. There is a pleural effusion on the left. There is consolidation in the medial left base. There is mild interstitial edema present with atelectatic change in the left mid and lower lung zones. Patient is status post coronary artery bypass grafting. Central catheter tip is in the superior vena cava. No pneumothorax. No bone lesions. IMPRESSION: Cardiomegaly with pulmonary vascular congestion. Left pleural effusion. There is a degree of interstitial edema. Suspect a degree of congestive heart failure. Consolidation in the medial left base may represent alveolar edema or pneumonia. Both entities may be present concurrently. Central catheter tip is in the superior vena cava. Patient is status post coronary artery bypass grafting. Electronically Signed   By: Lowella Grip III M.D.   On: 03/26/2019 16:56     Medications:   . sodium chloride 250 mL (03/27/19 0504)  . azithromycin 500 mg (03/26/19 2307)  . cefTRIAXone (ROCEPHIN)  IV 2 g (03/27/19 0505)  .  vancomycin     . albuterol  5 mg Nebulization Once  . amiodarone  200 mg Oral Daily  . apixaban  5 mg Oral BID  . aspirin EC  81 mg Oral Daily  . Chlorhexidine Gluconate Cloth  6 each Topical q morning - 10a  . dronabinol  5 mg Oral BID AC  . DULoxetine  30 mg Oral Daily  . ferrous sulfate  325 mg Oral BID WC  . folic acid  1 mg Oral Daily  . furosemide  40 mg Intravenous BID  . influenza vac split quadrivalent PF  0.5 mL Intramuscular Tomorrow-1000  . insulin aspart  0-15 Units Subcutaneous TID WC  . insulin aspart  0-5 Units Subcutaneous QHS  . metoprolol succinate  12.5 mg Oral Daily  . multivitamin  1 tablet Oral QHS  . nicotine  21 mg Transdermal Daily  . polyethylene glycol  17 g Oral Daily  . rosuvastatin  10 mg Oral Daily  . sodium chloride flush  3 mL Intravenous Q12H  . thiamine  100 mg Oral Daily   sodium  chloride, acetaminophen, ondansetron (ZOFRAN) IV, ondansetron, sodium chloride flush  Assessment/ Plan:  64 y.o. male with hypertension, hyperlipidemia, diabetes, depression, anxiety, adenocarcinoma of the lung stage IV, coronary disease, history of CABG, tobacco and h/o alcohol abuse, left pleural effusion admitted with SOB (shortness of breath) [R06.02] Hypoxia [R09.02] Acute on chronic combined systolic (congestive) and diastolic (congestive) heart failure (HCC) [I50.43]   Active Problems:   Diabetes mellitus with complication (HCC)   Essential hypertension   CAD (coronary artery disease), native coronary artery   Stage 4 lung cancer (HCC)   Acute on chronic combined systolic and diastolic CHF (congestive heart failure) ([place])   HCAP (healthcare-associated pneumonia)   Hypoxemia   ESRD on hemodialysis ([place])   Hx of CABG   AF (paroxysmal atrial fibrillation) (HCC)   Acute on chronic combined systolic (congestive) and diastolic (congestive) heart failure ([place])   #. ARF Requiring renal replacement therapy Started in early March Currently MWF schedule at  [facility] Patient is nonoliguric Electrolytes and volume status are acceptable at present.  No acute indication for dialysis.  Potassium 4.0 We will evaluate for hemodialysis tomorrow   #. Anemia of CKD  Lab Results  Component Value Date   HGB 9.2 (L) 03/27/2019   Holding Epogen due to malignancy  #. SHPTH     Component Value Date/Time   PTH 65 03/10/2019 1350   Lab Results  Component Value Date   PHOS 2.8 03/14/2019   Monitor calcium and phos level during this admission  #.  Shortness of breath, LE edema Differential diagnosis includes arrhythmia during dialysis, worsening of pleural effusions and compressive atelectasis,volume overload, acute coronary event Work-up in progress Repeat chest x-ray after dialysis tomorrow IV furosemide today   #. Diabetes type 2 with CKD Hgb A1c MFr Bld (%)  Date Value  03/26/2019 8.8 (H)     LOS: [place] 3/24/202110:11 AM  [place], [place]

## 2019-03-27 NOTE — Progress Notes (Signed)
PROGRESS NOTE    Craig Koch  ZYS:[id number] DOB: 14-Mar-1955 DOA: 03/26/2019 PCP: Tonia Ghent, MD      Assessment & Plan:   Active Problems:   Diabetes mellitus with complication (HCC)   Essential hypertension   CAD (coronary artery disease), native coronary artery   Stage 4 lung cancer (HCC)   Acute on chronic combined systolic and diastolic CHF (congestive heart failure) ([place])   HCAP (healthcare-associated pneumonia)   Hypoxemia   ESRD on hemodialysis (HCC)   Hx of CABG   AF (paroxysmal atrial fibrillation) (HCC)   Acute on chronic combined systolic (congestive) and diastolic (congestive) heart failure (HCC)   Acute hypoxia respiratory failure: etiology likely multifactorial related to heart failure exacerbation as well as underlying stage IV lung cancer  Acute on chronic combined CHF: continue on lasix. Monitor I/Os  ESRD: on HD TTS. Still makes urine. Management per nephro  Left pleural effusion: will continue on IV lasix. If no improvement will consider thoracentesis   Unlikely HCAP: recently treated for pneumonia during his hospitalization. Low suspicion for repeat pneumonia given afebrile and normal white cell count. IV Rocephin, azithromycin and vancomycin x1. Procalcitonin <0.10. Will d/c abxs   CAD: hx of CABG, NSTEMI 03/04/2019. Patient denies chest pain and EKG shows no acute ST-T wave changes. Continue on aspirin, metoprolol  PAF: continue on amiodarone and eliquis. Continue metoprolol.   DM2: uncontrolled, HbA1c 11.3. Continue on SSI w/ accuchecks  Essential hypertension: continue on metoprolol, amiodarone   Stage 4 lung cancer:  continue outpatient follow-up with oncology   DVT prophylaxis: eliquis Code Status: DNR Family Communication: discussed w/ pt's family who was at bedside and answered their questions Disposition Plan:  Will likely d/c back home but have PT/OT see the pt as well    Consultants:   nephro    Procedures:     Antimicrobials:   Subjective: Pt c/o shortness of breath   Objective: Vitals:   03/26/19 1930 03/26/19 2048 03/27/19 0412 03/27/19 0753  BP: 134/78 135/83 134/83 130/67  Pulse: 94 (!) 103 93 (!) 102  Resp: (!) 25 20 18 18   Temp:  98.1 F (36.7 C) 97.9 F (36.6 C) 97.7 F (36.5 C)  TempSrc:  Oral Oral Oral  SpO2: 100% 100% 100% 100%  Weight:  88.7 kg 88.4 kg   Height:  6' (1.829 m)      Intake/Output Summary (Last 24 hours) at 03/27/2019 0809 Last data filed at 03/27/2019 0549 Gross per 24 hour  Intake 614.78 ml  Output 500 ml  Net 114.78 ml   Filed Weights   03/26/19 1633 03/26/19 2048 03/27/19 0412  Weight: 86.2 kg 88.7 kg 88.4 kg    Examination:  General exam: Appears calm and comfortable  Respiratory system: diminished breath sounds b/l. Cardiovascular system: S1 & S2 +. No  rubs, gallops or clicks. B/l LE edema Gastrointestinal system: Abdomen is nondistended, soft and nontender. . Normal bowel sounds heard. Central nervous system: Alert and oriented. Moves all 4 extremities  Psychiatry: Judgement and insight appear normal. Flat mood and affect.     Data Reviewed: I have personally reviewed following labs and imaging studies  CBC: Recent Labs  Lab 03/26/19 1702  WBC 4.0  NEUTROABS 2.5  HGB 10.4*  HCT 32.8*  MCV 89.9  PLT 932   Basic Metabolic Panel: Recent Labs  Lab 03/26/19 1702 03/27/19 0532  NA 139 139  K 4.0 4.0  CL 100 100  CO2 28 24  GLUCOSE  174* 246*  BUN 13 18  CREATININE 1.82* 2.13*  CALCIUM 8.4* 8.4*   GFR: Estimated Creatinine Clearance: 38.5 mL/min (A) (by C-G formula based on SCr of 2.13 mg/dL (H)). Liver Function Tests: Recent Labs  Lab 03/26/19 1702  AST 26  ALT 15  ALKPHOS 114  BILITOT 1.0  PROT 7.2  ALBUMIN 2.8*   No results for input(s): LIPASE, AMYLASE in the last 168 hours. No results for input(s): AMMONIA in the last 168 hours. Coagulation Profile: No results for input(s): INR, PROTIME in the last 168  hours. Cardiac Enzymes: No results for input(s): CKTOTAL, CKMB, CKMBINDEX, TROPONINI in the last 168 hours. BNP (last 3 results) No results for input(s): PROBNP in the last 8760 hours. HbA1C: No results for input(s): HGBA1C in the last 72 hours. CBG: Recent Labs  Lab 03/26/19 2127 03/27/19 0757  GLUCAP 226* 229*   Lipid Profile: No results for input(s): CHOL, HDL, LDLCALC, TRIG, CHOLHDL, LDLDIRECT in the last 72 hours. Thyroid Function Tests: No results for input(s): TSH, T4TOTAL, FREET4, T3FREE, THYROIDAB in the last 72 hours. Anemia Panel: No results for input(s): VITAMINB12, FOLATE, FERRITIN, TIBC, IRON, RETICCTPCT in the last 72 hours. Sepsis Labs: Recent Labs  Lab 03/26/19 1939  PROCALCITON <0.10    Recent Results (from the past 240 hour(s))  Respiratory Panel by RT PCR (Flu A&B, Covid) - Nasopharyngeal Swab     Status: None   Collection Time: 03/19/19 10:41 AM   Specimen: Nasopharyngeal Swab  Result Value Ref Range Status   SARS Coronavirus 2 by RT PCR NEGATIVE NEGATIVE Final    Comment: (NOTE) SARS-CoV-2 target nucleic acids are NOT DETECTED. The SARS-CoV-2 RNA is generally detectable in upper respiratoy specimens during the acute phase of infection. The lowest concentration of SARS-CoV-2 viral copies this assay can detect is 131 copies/mL. A negative result does not preclude SARS-Cov-2 infection and should not be used as the sole basis for treatment or other patient management decisions. A negative result may occur with  improper specimen collection/handling, submission of specimen other than nasopharyngeal swab, presence of viral mutation(s) within the areas targeted by this assay, and inadequate number of viral copies (<131 copies/mL). A negative result must be combined with clinical observations, patient history, and epidemiological information. The expected result is Negative. Fact Sheet for Patients:  PinkCheek.be Fact Sheet  for Healthcare Providers:  GravelBags.it This test is not yet ap proved or cleared by the [place] FDA and  has been authorized for detection and/or diagnosis of SARS-CoV-2 by FDA under an Emergency Use Authorization (EUA). This EUA will remain  in effect (meaning this test can be used) for the duration of the COVID-19 declaration under Section 564(b)(1) of the Act, 21 U.S.C. section 360bbb-3(b)(1), unless the authorization is terminated or revoked sooner.    Influenza A by PCR NEGATIVE NEGATIVE Final   Influenza B by PCR NEGATIVE NEGATIVE Final    Comment: (NOTE) The Xpert Xpress SARS-CoV-2/FLU/RSV assay is intended as an aid in  the diagnosis of influenza from Nasopharyngeal swab specimens and  should not be used as a sole basis for treatment. Nasal washings and  aspirates are unacceptable for Xpert Xpress SARS-CoV-2/FLU/RSV  testing. Fact Sheet for Patients: PinkCheek.be Fact Sheet for Healthcare Providers: GravelBags.it This test is not yet approved or cleared by the [place] FDA and  has been authorized for detection and/or diagnosis of SARS-CoV-2 by  FDA under an Emergency Use Authorization (EUA). This EUA will remain  in effect (meaning this test  can be used) for the duration of the  Covid-19 declaration under Section 564(b)(1) of the Act, 21  U.S.C. section 360bbb-3(b)(1), unless the authorization is  terminated or revoked. Performed at [facility], [address] [place], [place] [address]   Respiratory Panel by RT PCR (Flu A&B, Covid) - Nasopharyngeal Swab     Status: None   Collection Time: 03/26/19  6:40 PM   Specimen: Nasopharyngeal Swab  Result Value Ref Range Status   SARS Coronavirus 2 by RT PCR NEGATIVE NEGATIVE Final    Comment: (NOTE) SARS-CoV-2 target nucleic acids are NOT DETECTED. The SARS-CoV-2 RNA is generally detectable in upper  respiratoy specimens during the acute phase of infection. The lowest concentration of SARS-CoV-2 viral copies this assay can detect is 131 copies/mL. A negative result does not preclude SARS-Cov-2 infection and should not be used as the sole basis for treatment or other patient management decisions. A negative result may occur with  improper specimen collection/handling, submission of specimen other than nasopharyngeal swab, presence of viral mutation(s) within the areas targeted by this assay, and inadequate number of viral copies (<131 copies/mL). A negative result must be combined with clinical observations, patient history, and epidemiological information. The expected result is Negative. Fact Sheet for Patients:  PinkCheek.be Fact Sheet for Healthcare Providers:  GravelBags.it This test is not yet ap proved or cleared by the [place] FDA and  has been authorized for detection and/or diagnosis of SARS-CoV-2 by FDA under an Emergency Use Authorization (EUA). This EUA will remain  in effect (meaning this test can be used) for the duration of the COVID-19 declaration under Section 564(b)(1) of the Act, 21 U.S.C. section 360bbb-3(b)(1), unless the authorization is terminated or revoked sooner.    Influenza A by PCR NEGATIVE NEGATIVE Final   Influenza B by PCR NEGATIVE NEGATIVE Final    Comment: (NOTE) The Xpert Xpress SARS-CoV-2/FLU/RSV assay is intended as an aid in  the diagnosis of influenza from Nasopharyngeal swab specimens and  should not be used as a sole basis for treatment. Nasal washings and  aspirates are unacceptable for Xpert Xpress SARS-CoV-2/FLU/RSV  testing. Fact Sheet for Patients: PinkCheek.be Fact Sheet for Healthcare Providers: GravelBags.it This test is not yet approved or cleared by the [place] FDA and  has been authorized for  detection and/or diagnosis of SARS-CoV-2 by  FDA under an Emergency Use Authorization (EUA). This EUA will remain  in effect (meaning this test can be used) for the duration of the  Covid-19 declaration under Section 564(b)(1) of the Act, 21  U.S.C. section 360bbb-3(b)(1), unless the authorization is  terminated or revoked. Performed at [facility], [address] [place], [place] [address]   MRSA PCR Screening     Status: None   Collection Time: 03/26/19  8:03 PM   Specimen: Nasal Mucosa; Nasopharyngeal  Result Value Ref Range Status   MRSA by PCR NEGATIVE NEGATIVE Final    Comment:        The GeneXpert MRSA Assay (FDA approved for NASAL specimens only), is one component of a comprehensive MRSA colonization surveillance program. It is not intended to diagnose MRSA infection nor to guide or monitor treatment for MRSA infections. Performed at [facility], [address], [place], [place] [address]          Radiology Studies: DG Chest 2 View  Result Date: 03/26/2019 CLINICAL DATA:  Shortness of breath EXAM: CHEST - 2 VIEW COMPARISON:  March 15, 2019 FINDINGS: Heart is  mildly enlarged with pulmonary venous hypertension. There is a pleural effusion on the left. There is consolidation in the medial left base. There is mild interstitial edema present with atelectatic change in the left mid and lower lung zones. Patient is status post coronary artery bypass grafting. Central catheter tip is in the superior vena cava. No pneumothorax. No bone lesions. IMPRESSION: Cardiomegaly with pulmonary vascular congestion. Left pleural effusion. There is a degree of interstitial edema. Suspect a degree of congestive heart failure. Consolidation in the medial left base may represent alveolar edema or pneumonia. Both entities may be present concurrently. Central catheter tip is in the superior vena cava. Patient is status post coronary artery bypass grafting. Electronically Signed    By: Lowella Grip III M.D.   On: 03/26/2019 16:56        Scheduled Meds: . albuterol  5 mg Nebulization Once  . amiodarone  200 mg Oral Daily  . apixaban  5 mg Oral BID  . aspirin EC  81 mg Oral Daily  . Chlorhexidine Gluconate Cloth  6 each Topical q morning - 10a  . dronabinol  5 mg Oral BID AC  . DULoxetine  30 mg Oral Daily  . ferrous sulfate  325 mg Oral BID WC  . folic acid  1 mg Oral Daily  . furosemide  40 mg Intravenous BID  . influenza vac split quadrivalent PF  0.5 mL Intramuscular Tomorrow-1000  . insulin aspart  0-15 Units Subcutaneous TID WC  . insulin aspart  0-5 Units Subcutaneous QHS  . metoprolol succinate  12.5 mg Oral Daily  . multivitamin  1 tablet Oral QHS  . nicotine  21 mg Transdermal Daily  . polyethylene glycol  17 g Oral Daily  . rosuvastatin  10 mg Oral Daily  . sodium chloride flush  3 mL Intravenous Q12H  . thiamine  100 mg Oral Daily   Continuous Infusions: . sodium chloride 250 mL (03/27/19 0504)  . azithromycin 500 mg (03/26/19 2307)  . cefTRIAXone (ROCEPHIN)  IV 2 g (03/27/19 0505)  . vancomycin       LOS: 1 day    Time spent: 35 mins    Wyvonnia Dusky, MD [facility] Pager [phone]  If 7PM-7AM, please contact night-coverage [email or web address] 03/27/2019, 8:09 AM

## 2019-03-27 NOTE — Progress Notes (Signed)
   03/27/19 1000  Clinical Encounter Type  Visited With Patient  Visit Type Follow-up;Spiritual support  Referral From Nurse  Consult/Referral To Chaplain  Chaplain received OR for patient. Chaplain arrived and made pastoral presence known. Patient stated he did not request for Chaplain. Chaplain ask was there anything she could do while she was here. Patient declined. Chaplain told patient if he changes his mind, have Chaplain page.

## 2019-03-28 ENCOUNTER — Inpatient Hospital Stay: Payer: Medicare Other

## 2019-03-28 LAB — BASIC METABOLIC PANEL
Anion gap: 12 (ref 5–15)
BUN: 25 mg/dL — ABNORMAL HIGH (ref 8–23)
CO2: 27 mmol/L (ref 22–32)
Calcium: 8.8 mg/dL — ABNORMAL LOW (ref 8.9–10.3)
Chloride: 98 mmol/L (ref 98–111)
Creatinine, Ser: 2.94 mg/dL — ABNORMAL HIGH (ref 0.61–1.24)
GFR calc Af Amer: 25 mL/min — ABNORMAL LOW (ref >60)
GFR calc non Af Amer: 21 mL/min — ABNORMAL LOW (ref >60)
Glucose, Bld: 282 mg/dL — ABNORMAL HIGH (ref 70–99)
Potassium: 3.6 mmol/L (ref 3.5–5.1)
Sodium: 137 mmol/L (ref 135–145)

## 2019-03-28 LAB — CBC
HCT: 31.3 % — ABNORMAL LOW (ref 39.0–52.0)
Hemoglobin: 10 g/dL — ABNORMAL LOW (ref 13.0–17.0)
MCH: 28.9 pg (ref 26.0–34.0)
MCHC: 31.9 g/dL (ref 30.0–36.0)
MCV: 90.5 fL (ref 80.0–100.0)
Platelets: 298 10*3/uL (ref 150–400)
RBC: 3.46 MIL/uL — ABNORMAL LOW (ref 4.22–5.81)
RDW: 18 % — ABNORMAL HIGH (ref 11.5–15.5)
WBC: 4.1 10*3/uL (ref 4.0–10.5)
nRBC: 0 % (ref 0.0–0.2)

## 2019-03-28 LAB — GLUCOSE, CAPILLARY
Glucose-Capillary: 269 mg/dL — ABNORMAL HIGH (ref 70–99)
Glucose-Capillary: 288 mg/dL — ABNORMAL HIGH (ref 70–99)
Glucose-Capillary: 210 mg/dL — ABNORMAL HIGH (ref 70–99)

## 2019-03-28 MED ORDER — INSULIN DETEMIR 100 UNIT/ML ~~LOC~~ SOLN
5.0000 [IU] | Freq: Every day | SUBCUTANEOUS | Status: DC
  Administered 2019-03-28: 5 [IU] via SUBCUTANEOUS
  Filled 2019-03-28 (×2): qty 0.05

## 2019-03-28 NOTE — Progress Notes (Signed)
PT Cancellation Note  Patient Details Name: Craig Koch MRN: [id number] DOB: 29-Nov-1955   Cancelled Treatment:    Reason Eval/Treat Not Completed: (Pt off the floor at this time for HD, PT to re-attempt as able.)  Lieutenant Diego PT, DPT 11:52 AM,03/28/19

## 2019-03-28 NOTE — TOC Initial Note (Signed)
Transition of Care [facility]) - Initial/Assessment Note    Patient Details  Name: Craig Koch MRN: [id number] Date of Birth: 02-04-1955  Transition of Care [facility]) CM/SW Contact:    Shelbie Ammons, RN Phone Number: 03/28/2019, 10:32 AM  Clinical Narrative:    RNCM placed call to wife due to patient being in dialysis and report from attending MD that wife wants to take patient home. Wife reports that it is her plan to take patient home when he is ready for discharge. She reports that she is not really happy with communication with [employer]. Informed wife that this nurse would let them know. She reports that he doesn't currently have any equipment at home and she is unsure if he will need it. Informed that he is pending PT/OT eval and equipment can be ordered at that time.  RNCM placed call and spoke to [place] with [facility] to let them know he won't be returning.  RNCM notified Estill Bamberg, HD co-ordinator.                Expected Discharge Plan: Home/Self Care Barriers to Discharge: Continued Medical Work up   Patient Goals and CMS Choice        Expected Discharge Plan and Services Expected Discharge Plan: Home/Self Care In-house Referral: Hospice / Palliative Care Discharge Planning Services: CM Consult   Living arrangements for the past 2 months: [place]                                      Prior Living Arrangements/Services Living arrangements for the past 2 months: [place] Lives with:: Spouse Patient language and need for interpreter reviewed:: Yes        Need for Family Participation in Patient Care: Yes (Comment) Care giver support system in place?: Yes (comment)   Criminal Activity/Legal Involvement Pertinent to Current Situation/Hospitalization: No - Comment as needed  Activities of Daily Living Home Assistive Devices/Equipment: Walker (specify type) ADL Screening (condition at time of admission) Patient's cognitive ability  adequate to safely complete daily activities?: Yes Is the patient deaf or have difficulty hearing?: Yes Does the patient have difficulty seeing, even when wearing glasses/contacts?: No Does the patient have difficulty concentrating, remembering, or making decisions?: Yes Patient able to express need for assistance with ADLs?: Yes Does the patient have difficulty dressing or bathing?: Yes Independently performs ADLs?: No Communication: Independent Dressing (OT): Needs assistance Is this a change from baseline?: Pre-admission baseline Grooming: Needs assistance Is this a change from baseline?: Pre-admission baseline Feeding: Independent Bathing: Needs assistance Is this a change from baseline?: Pre-admission baseline Toileting: Needs assistance Is this a change from baseline?: Pre-admission baseline In/Out Bed: Needs assistance Is this a change from baseline?: Pre-admission baseline Walks in Home: Independent with device (comment)(walker) Does the patient have difficulty walking or climbing stairs?: Yes Weakness of Legs: Both Weakness of Arms/Hands: Both  Permission Sought/Granted                  Emotional Assessment Appearance:: Appears stated age            Admission diagnosis:  SOB (shortness of breath) [R06.02] Hypoxia [R09.02] Acute on chronic combined systolic (congestive) and diastolic (congestive) heart failure ([place]) [I50.43] Patient Active Problem List   Diagnosis Date Noted  . Acute on chronic combined systolic and diastolic CHF (congestive heart failure) ([place]) 03/26/2019  . HCAP (healthcare-associated pneumonia)  03/26/2019  . Hypoxemia 03/26/2019  . ESRD on hemodialysis ([place]) 03/26/2019  . Hx of CABG 03/26/2019  . AF (paroxysmal atrial fibrillation) ([place]) 03/26/2019  . Acute on chronic combined systolic (congestive) and diastolic (congestive) heart failure ([place]) 03/26/2019  . S/P thoracentesis   . Pleural effusion   . AKI (acute kidney injury) ([place])    . Acute systolic heart failure ([place])   . Pain of right upper extremity   . Microcytic anemia   . Protein-calorie malnutrition, severe 03/06/2019  . Acute alteration in mental status   . Palliative care encounter   . Atrial fibrillation with RVR ([place]) 03/03/2019  . DKA (diabetic ketoacidoses) ([place]) 03/03/2019  . Diabetes mellitus without complication ([place]) 81/19/1478  . Hypothermia 03/03/2019  . SOB (shortness of breath) 03/03/2019  . Acute renal failure ([place]) 03/03/2019  . NSTEMI (non-ST elevated myocardial infarction) ([place]) 03/03/2019  . Hyponatremia 02/08/2019  . Uncontrolled type 2 diabetes mellitus with hyperglycemia ([place]) 02/08/2019  . Leg swelling 02/08/2019  . Paronychia, finger 12/19/2018  . Encounter for antineoplastic chemotherapy 12/08/2018  . Tobacco abuse 10/12/2018  . Malignant pleural effusion   . Administrative encounter 09/23/2018  . Stage 4 lung cancer ([place]) 09/08/2018  . Goals of care, counseling/discussion 09/08/2018  . Recurrent pleural effusion on left 09/02/2018  . Elevated troponin 08/24/2018  . Medicare annual wellness visit, subsequent 08/07/2018  . Lower urinary tract symptoms (LUTS) 08/07/2018  . Neck pain 08/07/2018  . CAD (coronary artery disease), native coronary artery 09/23/2017  . Smoker 09/23/2017  . Chronic joint pain 10/18/2016  . Healthcare maintenance 07/17/2016  . Advance care planning 07/17/2016  . Fungal rash of torso 05/06/2016  . Trigger finger 05/06/2016  . History of alcohol use 11/17/2015  . Skin lesion 05/19/2015  . Anxiety state 02/19/2015  . Hand weakness 05/23/2011  . Diabetes mellitus with complication ([place]) 29/56/2130  . HLD (hyperlipidemia) 03/05/2010  . Essential hypertension 03/05/2010  . Coronary atherosclerosis 03/05/2010   PCP:  Tonia Ghent, MD Pharmacy:   [id number] [place], [place] [address] Phone: [phone] Fax: [place], [place] [address] Phone: [phone] Fax: [phone]     Social Determinants of Health ([place]) Interventions    Readmission Risk Interventions Readmission Risk Prevention Plan 03/28/2019 03/06/2019  Transportation Screening Complete Complete  PCP or Specialist Appt within 3-5 Days - Complete  Medication Review ([place]) Complete Complete  PCP or Specialist appointment within 3-5 days of discharge Complete -  [place] or Home Care Consult Complete -  SW Recovery Care/Counseling Consult Complete -  Palliative Care Screening Complete -  [place] Patient Refused -  Some recent data might be hidden

## 2019-03-28 NOTE — Progress Notes (Signed)
HD treatment tolerated with no incidence. Pt states his breathing is better. Reinforced the importance of monitoring fluid intake.

## 2019-03-28 NOTE — Progress Notes (Signed)
[facility], [place] 03/28/19  Subjective:   LOS: 2 03/24 0701 - 03/25 0700 In: 240 [P.O.:240] Out: 9622 [Urine:1450]  Patient known to our practice from outpatient dialysis He presented to the emergency room via [facility] from dialysis.  He was sent for shortness of breath after finishing dialysis.  He reports as his sats were low which improved with oxygen.  He also reports increasing lower extremity edema Hospital course: Overall feeling a little better.  Able to eat.  No nausea or vomiting reported Continues to have lower extremity edema   HEMODIALYSIS FLOWSHEET:  Blood Flow Rate (mL/min): 400 mL/min Arterial Pressure (mmHg): -180 mmHg Venous Pressure (mmHg): 120 mmHg Transmembrane Pressure (mmHg): 60 mmHg Ultrafiltration Rate (mL/min): 930 mL/min Dialysate Flow Rate (mL/min): 800 ml/min Conductivity: Machine : 14 Conductivity: Machine : 14 Dialysis Fluid Bolus: Normal Saline Bolus Amount (mL): 250 mL    Objective:  Vital signs in last 24 hours:  Temp:  [97.7 F (36.5 C)-98.6 F (37 C)] 97.8 F (36.6 C) (03/25 1314) Pulse Rate:  [81-159] 91 (03/25 1314) Resp:  [15-26] 23 (03/25 1230) BP: (111-157)/(61-99) 126/62 (03/25 1314) SpO2:  [97 %-100 %] 100 % (03/25 1314) Weight:  [86.5 kg] 86.5 kg (03/25 0437)  Weight change: 0.273 kg Filed Weights   03/26/19 2048 03/27/19 0412 03/28/19 0437  Weight: 88.7 kg 88.4 kg 86.5 kg    Intake/Output:    Intake/Output Summary (Last 24 hours) at 03/28/2019 1338 Last data filed at 03/28/2019 1229 Gross per 24 hour  Intake 0 ml  Output 3175 ml  Net -3175 ml     Physical Exam: General:  No acute distress,    HEENT  anicteric, moist oral mucous membrane  Pulm/lungs  normal breathing effort, decreased BS at bases, [place] O2  CVS/Heart  irregular, tachycardic  Abdomen:   Soft, nontender  Extremities:  1-2+ pitting edema bilaterally  Neurologic:  Alert, oriented  Skin:  No acute rashes   Access:  PermCath       Basic Metabolic Panel:  Recent Labs  Lab 03/26/19 1702 03/27/19 0532 03/28/19 0605  NA 139 139 137  K 4.0 4.0 3.6  CL 100 100 98  CO2 28 24 27   GLUCOSE 174* 246* 282*  BUN 13 18 25*  CREATININE 1.82* 2.13* 2.94*  CALCIUM 8.4* 8.4* 8.8*     CBC: Recent Labs  Lab 03/26/19 1702 03/27/19 0532 03/28/19 0605  WBC 4.0 4.4 4.1  NEUTROABS 2.5  --   --   HGB 10.4* 9.2* 10.0*  HCT 32.8* 30.2* 31.3*  MCV 89.9 93.2 90.5  PLT 211 248 298      Lab Results  Component Value Date   HEPBSAG NON REACTIVE 03/11/2019   HEPBIGM NON REACTIVE 03/11/2019      Microbiology:  Recent Results (from the past 240 hour(s))  Respiratory Panel by RT PCR (Flu A&B, Covid) - Nasopharyngeal Swab     Status: None   Collection Time: 03/19/19 10:41 AM   Specimen: Nasopharyngeal Swab  Result Value Ref Range Status   SARS Coronavirus 2 by RT PCR NEGATIVE NEGATIVE Final    Comment: (NOTE) SARS-CoV-2 target nucleic acids are NOT DETECTED. The SARS-CoV-2 RNA is generally detectable in upper respiratoy specimens during the acute phase of infection. The lowest concentration of SARS-CoV-2 viral copies this assay can detect is 131 copies/mL. A negative result does not preclude SARS-Cov-2 infection and should not be used as the sole basis for treatment or other patient  management decisions. A negative result may occur with  improper specimen collection/handling, submission of specimen other than nasopharyngeal swab, presence of viral mutation(s) within the areas targeted by this assay, and inadequate number of viral copies (<131 copies/mL). A negative result must be combined with clinical observations, patient history, and epidemiological information. The expected result is Negative. Fact Sheet for Patients:  PinkCheek.be Fact Sheet for Healthcare Providers:  GravelBags.it This test is not yet ap proved or cleared  by the [place] FDA and  has been authorized for detection and/or diagnosis of SARS-CoV-2 by FDA under an Emergency Use Authorization (EUA). This EUA will remain  in effect (meaning this test can be used) for the duration of the COVID-19 declaration under Section 564(b)(1) of the Act, 21 U.S.C. section 360bbb-3(b)(1), unless the authorization is terminated or revoked sooner.    Influenza A by PCR NEGATIVE NEGATIVE Final   Influenza B by PCR NEGATIVE NEGATIVE Final    Comment: (NOTE) The Xpert Xpress SARS-CoV-2/FLU/RSV assay is intended as an aid in  the diagnosis of influenza from Nasopharyngeal swab specimens and  should not be used as a sole basis for treatment. Nasal washings and  aspirates are unacceptable for Xpert Xpress SARS-CoV-2/FLU/RSV  testing. Fact Sheet for Patients: PinkCheek.be Fact Sheet for Healthcare Providers: GravelBags.it This test is not yet approved or cleared by the [place] FDA and  has been authorized for detection and/or diagnosis of SARS-CoV-2 by  FDA under an Emergency Use Authorization (EUA). This EUA will remain  in effect (meaning this test can be used) for the duration of the  Covid-19 declaration under Section 564(b)(1) of the Act, 21  U.S.C. section 360bbb-3(b)(1), unless the authorization is  terminated or revoked. Performed at [facility], [address] [place], [place] [address]   Respiratory Panel by RT PCR (Flu A&B, Covid) - Nasopharyngeal Swab     Status: None   Collection Time: 03/26/19  6:40 PM   Specimen: Nasopharyngeal Swab  Result Value Ref Range Status   SARS Coronavirus 2 by RT PCR NEGATIVE NEGATIVE Final    Comment: (NOTE) SARS-CoV-2 target nucleic acids are NOT DETECTED. The SARS-CoV-2 RNA is generally detectable in upper respiratoy specimens during the acute phase of infection. The lowest concentration of SARS-CoV-2 viral copies this assay can  detect is 131 copies/mL. A negative result does not preclude SARS-Cov-2 infection and should not be used as the sole basis for treatment or other patient management decisions. A negative result may occur with  improper specimen collection/handling, submission of specimen other than nasopharyngeal swab, presence of viral mutation(s) within the areas targeted by this assay, and inadequate number of viral copies (<131 copies/mL). A negative result must be combined with clinical observations, patient history, and epidemiological information. The expected result is Negative. Fact Sheet for Patients:  PinkCheek.be Fact Sheet for Healthcare Providers:  GravelBags.it This test is not yet ap proved or cleared by the [place] FDA and  has been authorized for detection and/or diagnosis of SARS-CoV-2 by FDA under an Emergency Use Authorization (EUA). This EUA will remain  in effect (meaning this test can be used) for the duration of the COVID-19 declaration under Section 564(b)(1) of the Act, 21 U.S.C. section 360bbb-3(b)(1), unless the authorization is terminated or revoked sooner.    Influenza A by PCR NEGATIVE NEGATIVE Final   Influenza B by PCR NEGATIVE NEGATIVE Final    Comment: (NOTE) The Xpert Xpress SARS-CoV-2/FLU/RSV assay is intended as an aid in  the diagnosis  of influenza from Nasopharyngeal swab specimens and  should not be used as a sole basis for treatment. Nasal washings and  aspirates are unacceptable for Xpert Xpress SARS-CoV-2/FLU/RSV  testing. Fact Sheet for Patients: PinkCheek.be Fact Sheet for Healthcare Providers: GravelBags.it This test is not yet approved or cleared by the [place] FDA and  has been authorized for detection and/or diagnosis of SARS-CoV-2 by  FDA under an Emergency Use Authorization (EUA). This EUA will remain  in effect (meaning  this test can be used) for the duration of the  Covid-19 declaration under Section 564(b)(1) of the Act, 21  U.S.C. section 360bbb-3(b)(1), unless the authorization is  terminated or revoked. Performed at [facility], [address] [place], [place] [address]   MRSA PCR Screening     Status: None   Collection Time: 03/26/19  8:03 PM   Specimen: Nasal Mucosa; Nasopharyngeal  Result Value Ref Range Status   MRSA by PCR NEGATIVE NEGATIVE Final    Comment:        The GeneXpert MRSA Assay (FDA approved for NASAL specimens only), is one component of a comprehensive MRSA colonization surveillance program. It is not intended to diagnose MRSA infection nor to guide or monitor treatment for MRSA infections. Performed at [facility], [address] [place], [place] [address]     Coagulation Studies: No results for input(s): LABPROT, INR in the last 72 hours.  Urinalysis: No results for input(s): COLORURINE, LABSPEC, PHURINE, GLUCOSEU, HGBUR, BILIRUBINUR, KETONESUR, PROTEINUR, UROBILINOGEN, NITRITE, LEUKOCYTESUR in the last 72 hours.  Invalid input(s): APPERANCEUR    Imaging: DG Chest 2 View  Result Date: 03/26/2019 CLINICAL DATA:  Shortness of breath EXAM: CHEST - 2 VIEW COMPARISON:  March 15, 2019 FINDINGS: Heart is mildly enlarged with pulmonary venous hypertension. There is a pleural effusion on the left. There is consolidation in the medial left base. There is mild interstitial edema present with atelectatic change in the left mid and lower lung zones. Patient is status post coronary artery bypass grafting. Central catheter tip is in the superior vena cava. No pneumothorax. No bone lesions. IMPRESSION: Cardiomegaly with pulmonary vascular congestion. Left pleural effusion. There is a degree of interstitial edema. Suspect a degree of congestive heart failure. Consolidation in the medial left base may represent alveolar edema or pneumonia. Both entities may be  present concurrently. Central catheter tip is in the superior vena cava. Patient is status post coronary artery bypass grafting. Electronically Signed   By: Lowella Grip III M.D.   On: 03/26/2019 16:56     Medications:   . sodium chloride 250 mL (03/27/19 0504)   . albuterol  5 mg Nebulization Once  . amiodarone  200 mg Oral Daily  . apixaban  5 mg Oral BID  . aspirin EC  81 mg Oral Daily  . Chlorhexidine Gluconate Cloth  6 each Topical q morning - 10a  . dronabinol  5 mg Oral BID AC  . DULoxetine  30 mg Oral Daily  . feeding supplement (NEPRO CARB STEADY)  237 mL Oral BID BM  . ferrous sulfate  325 mg Oral BID WC  . folic acid  1 mg Oral Daily  . furosemide  80 mg Intravenous BID  . influenza vac split quadrivalent PF  0.5 mL Intramuscular Tomorrow-1000  . insulin aspart  0-15 Units Subcutaneous TID WC  . insulin aspart  0-5 Units Subcutaneous QHS  . insulin detemir  5 Units Subcutaneous QHS  . metoprolol succinate  12.5  mg Oral Daily  . multivitamin  1 tablet Oral QHS  . nicotine  21 mg Transdermal Daily  . polyethylene glycol  17 g Oral Daily  . rosuvastatin  10 mg Oral Daily  . sodium chloride flush  3 mL Intravenous Q12H  . thiamine  100 mg Oral Daily   sodium chloride, acetaminophen, ondansetron (ZOFRAN) IV, ondansetron, sodium chloride, sodium chloride flush  Assessment/ Plan:  64 y.o. male with hypertension, hyperlipidemia, diabetes, depression, anxiety, adenocarcinoma of the lung stage IV, coronary disease, history of CABG, tobacco and h/o alcohol abuse, left pleural effusion admitted with SOB (shortness of breath) [R06.02] Hypoxia [R09.02] Acute on chronic combined systolic (congestive) and diastolic (congestive) heart failure (HCC) [I50.43]   Active Problems:   Diabetes mellitus with complication (HCC)   Essential hypertension   CAD (coronary artery disease), native coronary artery   Stage 4 lung cancer (HCC)   Acute on chronic combined systolic and  diastolic CHF (congestive heart failure) ([place])   HCAP (healthcare-associated pneumonia)   Hypoxemia   ESRD on hemodialysis ([place])   Hx of CABG   AF (paroxysmal atrial fibrillation) (HCC)   Acute on chronic combined systolic (congestive) and diastolic (congestive) heart failure ([place])   #. ARF Requiring renal replacement therapy Started in early March Currently MWF schedule at [facility] Patient is nonoliguric Patient seen during dialysis Tolerating well  2 L removed with HD    #. Anemia of CKD  Lab Results  Component Value Date   HGB 10.0 (L) 03/28/2019   Holding Epogen due to malignancy  #. SHPTH     Component Value Date/Time   PTH 65 03/10/2019 1350   Lab Results  Component Value Date   PHOS 2.8 03/14/2019   Monitor calcium and phos level during this admission  #.  Shortness of breath, LE edema Differential diagnosis includes arrhythmia during dialysis, worsening of pleural effusions and compressive atelectasis,volume overload, acute coronary event Work-up in progress Repeat chest x-ray after dialysis   Continue IV furosemide     #. Diabetes type 2 with CKD Hgb A1c MFr Bld (%)  Date Value  03/26/2019 8.8 (H)     LOS: [place] 3/25/20211:38 [place], [place]

## 2019-03-28 NOTE — Progress Notes (Signed)
OT Cancellation Note  Patient Details Name: Craig Koch MRN: [id number] DOB: [birth year]   Cancelled Treatment:    Reason Eval/Treat Not Completed: Patient at procedure or test/ unavailable  OT consult received and chart reviewed. Pt off floor at HD at this time. Will f/u for OT evaluation at later date/time as able. Thank you.  Gerrianne Scale, [place], OTR/L ascom [phone] 03/28/19, 9:46 AM

## 2019-03-28 NOTE — Progress Notes (Signed)
ReDS Pro not appropriate due to presence of perm-cath.

## 2019-03-28 NOTE — Progress Notes (Signed)
PROGRESS NOTE    Craig Koch  YTK:[id number] DOB: 04-07-1955 DOA: 03/26/2019 PCP: Tonia Ghent, MD      Assessment & Plan:   Active Problems:   Diabetes mellitus with complication (HCC)   Essential hypertension   CAD (coronary artery disease), native coronary artery   Stage 4 lung cancer (HCC)   Acute on chronic combined systolic and diastolic CHF (congestive heart failure) ([place])   HCAP (healthcare-associated pneumonia)   Hypoxemia   ESRD on hemodialysis (HCC)   Hx of CABG   AF (paroxysmal atrial fibrillation) (HCC)   Acute on chronic combined systolic (congestive) and diastolic (congestive) heart failure (HCC)   Acute hypoxia respiratory failure: etiology likely multifactorial related to heart failure exacerbation as well as underlying stage IV lung cancer  Acute on chronic combined CHF: continue on lasix. Monitor I/Os  Acute renal failure: on HD TTS. Will have HD today. Still makes urine. Management per nephro  Left pleural effusion: will continue on IV lasix. Neg approx 1.5 L. Will repeat CXR this afternoon    Unlikely HCAP: recently treated for pneumonia during his hospitalization. Low suspicion for repeat pneumonia given afebrile and normal white cell count. IV Rocephin, azithromycin and vancomycin x1. Procalcitonin <0.10. Abxs were d/c  CAD: hx of CABG, NSTEMI 03/04/2019. Patient denies chest pain and EKG shows no acute ST-T wave changes. Continue on aspirin, metoprolol  PAF: continue on amiodarone and eliquis. Continue metoprolol.   DM2: uncontrolled, HbA1c 11.3. Continue on SSI w/ accuchecks  Essential hypertension: continue on metoprolol, amiodarone   Stage 4 lung cancer:  continue outpatient follow-up with oncology  Generalized weakness: PT/OT consulted   DVT prophylaxis: eliquis Code Status: DNR Family Communication: discussed w/ pt's wife, Georga Kaufmann, & answered her questions  Disposition Plan:  Will likely d/c back home but have PT/OT see the pt  as well    Consultants:   nephro    Procedures:    Antimicrobials:   Subjective: Pt c/o fatigue and shortness of breath which has improved from day prior.  Objective: Vitals:   03/27/19 1637 03/27/19 1942 03/28/19 0437 03/28/19 0750  BP: 125/65 125/64 122/70 136/69  Pulse: 87 87 96 98  Resp: 16   16  Temp: 97.9 F (36.6 C) 98.1 F (36.7 C) 97.7 F (36.5 C) 98.6 F (37 C)  TempSrc: Oral Oral Oral Oral  SpO2: 100% 100% 99% 99%  Weight:   86.5 kg   Height:        Intake/Output Summary (Last 24 hours) at 03/28/2019 0807 Last data filed at 03/28/2019 0524 Gross per 24 hour  Intake 240 ml  Output 1450 ml  Net -1210 ml   Filed Weights   03/26/19 2048 03/27/19 0412 03/28/19 0437  Weight: 88.7 kg 88.4 kg 86.5 kg    Examination:  General exam: Appears calm and comfortable  Respiratory system: decreased breath sounds b/l. No wheezes Cardiovascular system: S1 & S2 +. No  rubs, gallops or clicks. B/l LE edema Gastrointestinal system: Abdomen is nondistended, soft and nontender. . Normal bowel sounds heard. Central nervous system: Alert and oriented. Moves all 4 extremities  Psychiatry: Judgement and insight appear normal. Flat mood and affect.     Data Reviewed: I have personally reviewed following labs and imaging studies  CBC: Recent Labs  Lab 03/26/19 1702 03/27/19 0532 03/28/19 0605  WBC 4.0 4.4 4.1  NEUTROABS 2.5  --   --   HGB 10.4* 9.2* 10.0*  HCT 32.8* 30.2* 31.3*  MCV 89.9  93.2 90.5  PLT 211 248 053   Basic Metabolic Panel: Recent Labs  Lab 03/26/19 1702 03/27/19 0532 03/28/19 0605  NA 139 139 137  K 4.0 4.0 3.6  CL 100 100 98  CO2 28 24 27   GLUCOSE 174* 246* 282*  BUN 13 18 25*  CREATININE 1.82* 2.13* 2.94*  CALCIUM 8.4* 8.4* 8.8*   GFR: Estimated Creatinine Clearance: 27.9 mL/min (A) (by C-G formula based on SCr of 2.94 mg/dL (H)). Liver Function Tests: Recent Labs  Lab 03/26/19 1702  AST 26  ALT 15  ALKPHOS 114  BILITOT 1.0    PROT 7.2  ALBUMIN 2.8*   No results for input(s): LIPASE, AMYLASE in the last 168 hours. No results for input(s): AMMONIA in the last 168 hours. Coagulation Profile: No results for input(s): INR, PROTIME in the last 168 hours. Cardiac Enzymes: No results for input(s): CKTOTAL, CKMB, CKMBINDEX, TROPONINI in the last 168 hours. BNP (last 3 results) No results for input(s): PROBNP in the last 8760 hours. HbA1C: Recent Labs    03/26/19 2155  HGBA1C 8.8*   CBG: Recent Labs  Lab 03/27/19 0757 03/27/19 1139 03/27/19 1639 03/27/19 2108 03/28/19 0750  GLUCAP 229* 194* 286* 244* 269*   Lipid Profile: No results for input(s): CHOL, HDL, LDLCALC, TRIG, CHOLHDL, LDLDIRECT in the last 72 hours. Thyroid Function Tests: No results for input(s): TSH, T4TOTAL, FREET4, T3FREE, THYROIDAB in the last 72 hours. Anemia Panel: No results for input(s): VITAMINB12, FOLATE, FERRITIN, TIBC, IRON, RETICCTPCT in the last 72 hours. Sepsis Labs: Recent Labs  Lab 03/26/19 1939  PROCALCITON <0.10    Recent Results (from the past 240 hour(s))  Respiratory Panel by RT PCR (Flu A&B, Covid) - Nasopharyngeal Swab     Status: None   Collection Time: 03/19/19 10:41 AM   Specimen: Nasopharyngeal Swab  Result Value Ref Range Status   SARS Coronavirus 2 by RT PCR NEGATIVE NEGATIVE Final    Comment: (NOTE) SARS-CoV-2 target nucleic acids are NOT DETECTED. The SARS-CoV-2 RNA is generally detectable in upper respiratoy specimens during the acute phase of infection. The lowest concentration of SARS-CoV-2 viral copies this assay can detect is 131 copies/mL. A negative result does not preclude SARS-Cov-2 infection and should not be used as the sole basis for treatment or other patient management decisions. A negative result may occur with  improper specimen collection/handling, submission of specimen other than nasopharyngeal swab, presence of viral mutation(s) within the areas targeted by this assay, and  inadequate number of viral copies (<131 copies/mL). A negative result must be combined with clinical observations, patient history, and epidemiological information. The expected result is Negative. Fact Sheet for Patients:  PinkCheek.be Fact Sheet for Healthcare Providers:  GravelBags.it This test is not yet ap proved or cleared by the [place] FDA and  has been authorized for detection and/or diagnosis of SARS-CoV-2 by FDA under an Emergency Use Authorization (EUA). This EUA will remain  in effect (meaning this test can be used) for the duration of the COVID-19 declaration under Section 564(b)(1) of the Act, 21 U.S.C. section 360bbb-3(b)(1), unless the authorization is terminated or revoked sooner.    Influenza A by PCR NEGATIVE NEGATIVE Final   Influenza B by PCR NEGATIVE NEGATIVE Final    Comment: (NOTE) The Xpert Xpress SARS-CoV-2/FLU/RSV assay is intended as an aid in  the diagnosis of influenza from Nasopharyngeal swab specimens and  should not be used as a sole basis for treatment. Nasal washings and  aspirates are unacceptable  for Xpert Xpress SARS-CoV-2/FLU/RSV  testing. Fact Sheet for Patients: PinkCheek.be Fact Sheet for Healthcare Providers: GravelBags.it This test is not yet approved or cleared by the [place] FDA and  has been authorized for detection and/or diagnosis of SARS-CoV-2 by  FDA under an Emergency Use Authorization (EUA). This EUA will remain  in effect (meaning this test can be used) for the duration of the  Covid-19 declaration under Section 564(b)(1) of the Act, 21  U.S.C. section 360bbb-3(b)(1), unless the authorization is  terminated or revoked. Performed at [facility], [address] [place], [place] [address]   Respiratory Panel by RT PCR (Flu A&B, Covid) - Nasopharyngeal Swab     Status: None   Collection Time:  03/26/19  6:40 PM   Specimen: Nasopharyngeal Swab  Result Value Ref Range Status   SARS Coronavirus 2 by RT PCR NEGATIVE NEGATIVE Final    Comment: (NOTE) SARS-CoV-2 target nucleic acids are NOT DETECTED. The SARS-CoV-2 RNA is generally detectable in upper respiratoy specimens during the acute phase of infection. The lowest concentration of SARS-CoV-2 viral copies this assay can detect is 131 copies/mL. A negative result does not preclude SARS-Cov-2 infection and should not be used as the sole basis for treatment or other patient management decisions. A negative result may occur with  improper specimen collection/handling, submission of specimen other than nasopharyngeal swab, presence of viral mutation(s) within the areas targeted by this assay, and inadequate number of viral copies (<131 copies/mL). A negative result must be combined with clinical observations, patient history, and epidemiological information. The expected result is Negative. Fact Sheet for Patients:  PinkCheek.be Fact Sheet for Healthcare Providers:  GravelBags.it This test is not yet ap proved or cleared by the [place] FDA and  has been authorized for detection and/or diagnosis of SARS-CoV-2 by FDA under an Emergency Use Authorization (EUA). This EUA will remain  in effect (meaning this test can be used) for the duration of the COVID-19 declaration under Section 564(b)(1) of the Act, 21 U.S.C. section 360bbb-3(b)(1), unless the authorization is terminated or revoked sooner.    Influenza A by PCR NEGATIVE NEGATIVE Final   Influenza B by PCR NEGATIVE NEGATIVE Final    Comment: (NOTE) The Xpert Xpress SARS-CoV-2/FLU/RSV assay is intended as an aid in  the diagnosis of influenza from Nasopharyngeal swab specimens and  should not be used as a sole basis for treatment. Nasal washings and  aspirates are unacceptable for Xpert Xpress SARS-CoV-2/FLU/RSV    testing. Fact Sheet for Patients: PinkCheek.be Fact Sheet for Healthcare Providers: GravelBags.it This test is not yet approved or cleared by the [place] FDA and  has been authorized for detection and/or diagnosis of SARS-CoV-2 by  FDA under an Emergency Use Authorization (EUA). This EUA will remain  in effect (meaning this test can be used) for the duration of the  Covid-19 declaration under Section 564(b)(1) of the Act, 21  U.S.C. section 360bbb-3(b)(1), unless the authorization is  terminated or revoked. Performed at [facility], [address] [place], [place] [address]   MRSA PCR Screening     Status: None   Collection Time: 03/26/19  8:03 PM   Specimen: Nasal Mucosa; Nasopharyngeal  Result Value Ref Range Status   MRSA by PCR NEGATIVE NEGATIVE Final    Comment:        The GeneXpert MRSA Assay (FDA approved for NASAL specimens only), is one component of a comprehensive MRSA colonization surveillance program. It is not intended to diagnose  MRSA infection nor to guide or monitor treatment for MRSA infections. Performed at [facility], [address] [place], [place] [address]          Radiology Studies: DG Chest 2 View  Result Date: 03/26/2019 CLINICAL DATA:  Shortness of breath EXAM: CHEST - 2 VIEW COMPARISON:  March 15, 2019 FINDINGS: Heart is mildly enlarged with pulmonary venous hypertension. There is a pleural effusion on the left. There is consolidation in the medial left base. There is mild interstitial edema present with atelectatic change in the left mid and lower lung zones. Patient is status post coronary artery bypass grafting. Central catheter tip is in the superior vena cava. No pneumothorax. No bone lesions. IMPRESSION: Cardiomegaly with pulmonary vascular congestion. Left pleural effusion. There is a degree of interstitial edema. Suspect a degree of congestive heart  failure. Consolidation in the medial left base may represent alveolar edema or pneumonia. Both entities may be present concurrently. Central catheter tip is in the superior vena cava. Patient is status post coronary artery bypass grafting. Electronically Signed   By: Lowella Grip III M.D.   On: 03/26/2019 16:56        Scheduled Meds: . albuterol  5 mg Nebulization Once  . amiodarone  200 mg Oral Daily  . apixaban  5 mg Oral BID  . aspirin EC  81 mg Oral Daily  . Chlorhexidine Gluconate Cloth  6 each Topical q morning - 10a  . dronabinol  5 mg Oral BID AC  . DULoxetine  30 mg Oral Daily  . feeding supplement (NEPRO CARB STEADY)  237 mL Oral BID BM  . ferrous sulfate  325 mg Oral BID WC  . folic acid  1 mg Oral Daily  . furosemide  80 mg Intravenous BID  . influenza vac split quadrivalent PF  0.5 mL Intramuscular Tomorrow-1000  . insulin aspart  0-15 Units Subcutaneous TID WC  . insulin aspart  0-5 Units Subcutaneous QHS  . metoprolol succinate  12.5 mg Oral Daily  . multivitamin  1 tablet Oral QHS  . nicotine  21 mg Transdermal Daily  . polyethylene glycol  17 g Oral Daily  . rosuvastatin  10 mg Oral Daily  . sodium chloride flush  3 mL Intravenous Q12H  . thiamine  100 mg Oral Daily   Continuous Infusions: . sodium chloride 250 mL (03/27/19 0504)     LOS: 2 days    Time spent: 33 mins    Wyvonnia Dusky, MD [facility] Pager [phone]  If 7PM-7AM, please contact night-coverage [email or web address] 03/28/2019, 8:07 AM

## 2019-03-28 NOTE — Progress Notes (Signed)
HD treatment initiated at [phone]

## 2019-03-28 NOTE — Telephone Encounter (Signed)
Patient currently admitted since 03/26/19. Closing this encounter.

## 2019-03-28 NOTE — Progress Notes (Signed)
Goal increased to 2.5 kg per MD order as BP trending upwards. S.Ingram, Physicist, medical aware and in agreement.

## 2019-03-29 ENCOUNTER — Inpatient Hospital Stay: Payer: Medicare Other

## 2019-03-29 ENCOUNTER — Inpatient Hospital Stay: Payer: Medicare Other | Admitting: Oncology

## 2019-03-29 LAB — BODY FLUID CELL COUNT WITH DIFFERENTIAL
Eos, Fluid: 0 %
Lymphs, Fluid: 56 %
Monocyte-Macrophage-Serous Fluid: 41 %
Neutrophil Count, Fluid: 3 %
Total Nucleated Cell Count, Fluid: 82 cu mm

## 2019-03-29 LAB — CBC
HCT: 28 % — ABNORMAL LOW (ref 39.0–52.0)
Hemoglobin: 8.9 g/dL — ABNORMAL LOW (ref 13.0–17.0)
MCH: 28.4 pg (ref 26.0–34.0)
MCHC: 31.8 g/dL (ref 30.0–36.0)
MCV: 89.5 fL (ref 80.0–100.0)
Platelets: 277 10*3/uL (ref 150–400)
RBC: 3.13 MIL/uL — ABNORMAL LOW (ref 4.22–5.81)
RDW: 17.7 % — ABNORMAL HIGH (ref 11.5–15.5)
WBC: 3.9 10*3/uL — ABNORMAL LOW (ref 4.0–10.5)
nRBC: 0 % (ref 0.0–0.2)

## 2019-03-29 LAB — GLUCOSE, CAPILLARY
Glucose-Capillary: 211 mg/dL — ABNORMAL HIGH (ref 70–99)
Glucose-Capillary: 297 mg/dL — ABNORMAL HIGH (ref 70–99)
Glucose-Capillary: 339 mg/dL — ABNORMAL HIGH (ref 70–99)
Glucose-Capillary: 181 mg/dL — ABNORMAL HIGH (ref 70–99)

## 2019-03-29 LAB — PHOSPHORUS: Phosphorus: 2.8 mg/dL (ref 2.5–4.6)

## 2019-03-29 LAB — BASIC METABOLIC PANEL
Anion gap: 11 (ref 5–15)
BUN: 18 mg/dL (ref 8–23)
CO2: 29 mmol/L (ref 22–32)
Calcium: 8.4 mg/dL — ABNORMAL LOW (ref 8.9–10.3)
Chloride: 98 mmol/L (ref 98–111)
Creatinine, Ser: 2.08 mg/dL — ABNORMAL HIGH (ref 0.61–1.24)
GFR calc Af Amer: 38 mL/min — ABNORMAL LOW (ref >60)
GFR calc non Af Amer: 33 mL/min — ABNORMAL LOW (ref >60)
Glucose, Bld: 235 mg/dL — ABNORMAL HIGH (ref 70–99)
Potassium: 3.3 mmol/L — ABNORMAL LOW (ref 3.5–5.1)
Sodium: 138 mmol/L (ref 135–145)

## 2019-03-29 LAB — PROTEIN, PLEURAL OR PERITONEAL FLUID: Total protein, fluid: <3 g/dL

## 2019-03-29 LAB — LACTATE DEHYDROGENASE, PLEURAL OR PERITONEAL FLUID: LD, Fluid: 54 U/L — ABNORMAL HIGH (ref 3–23)

## 2019-03-29 LAB — ALBUMIN, PLEURAL OR PERITONEAL FLUID: Albumin, Fluid: <1 g/dL

## 2019-03-29 MED ORDER — TORSEMIDE 20 MG PO TABS
40.0000 mg | ORAL_TABLET | Freq: Every day | ORAL | Status: DC
  Administered 2019-03-30 – 2019-04-01 (×3): 40 mg via ORAL
  Filled 2019-03-29 (×3): qty 2

## 2019-03-29 MED ORDER — INSULIN DETEMIR 100 UNIT/ML ~~LOC~~ SOLN
12.0000 [IU] | Freq: Every day | SUBCUTANEOUS | Status: DC
  Administered 2019-03-29 – 2019-03-31 (×3): 12 [IU] via SUBCUTANEOUS
  Filled 2019-03-29 (×4): qty 0.12

## 2019-03-29 NOTE — Progress Notes (Addendum)
PROGRESS NOTE    ED MANDICH  EVO:[id number] DOB: Oct 07, 1955 DOA: 03/26/2019 PCP: Tonia Ghent, MD      Assessment & Plan:   Active Problems:   Diabetes mellitus with complication (HCC)   Essential hypertension   CAD (coronary artery disease), native coronary artery   Stage 4 lung cancer (HCC)   Acute on chronic combined systolic and diastolic CHF (congestive heart failure) ([place])   HCAP (healthcare-associated pneumonia)   Hypoxemia   ESRD on hemodialysis (HCC)   Hx of CABG   AF (paroxysmal atrial fibrillation) (HCC)   Acute on chronic combined systolic (congestive) and diastolic (congestive) heart failure (HCC)   Acute hypoxia respiratory failure: etiology likely multifactorial related to heart failure exacerbation, underlying stage IV lung cancer, & left pleural effusion  Acute on chronic combined CHF: continue on lasix. Monitor I/Os  Acute renal failure: on HD TTS. Will have HD today. Still makes urine. Management per nephro  B/l pleural effusion: will continue on IV lasix. Neg approx 2 L. Right thoracentesis w/ fluid studies ordered   Unlikely HCAP: recently treated for pneumonia during his hospitalization. Low suspicion for repeat pneumonia given afebrile and normal white cell count. IV Rocephin, azithromycin and vancomycin x1. Procalcitonin <0.10. Abxs were d/c  CAD: hx of CABG, NSTEMI 03/04/2019. Patient denies chest pain and EKG shows no acute ST-T wave changes. Continue on aspirin, metoprolol  PAF: continue on amiodarone and eliquis. Continue metoprolol.   DM2: uncontrolled, HbA1c 11.3. Continue on SSI w/ accuchecks  Essential hypertension: continue on metoprolol, amiodarone   Stage 4 lung cancer:  continue outpatient follow-up with oncology  Generalized weakness: OT recs home health. PT consulted   DVT prophylaxis: eliquis Code Status: DNR Family Communication:  Disposition Plan:  Will likely d/c back home w/ home health but PT still needs to see  the pt    Consultants:   nephro    Procedures:    Antimicrobials:   Subjective: Pt c/o malaise and shortness of breath especially with exertion   Objective: Vitals:   03/28/19 1628 03/28/19 1946 03/29/19 0352 03/29/19 0737  BP: 120/61 106/65 108/63 125/78  Pulse: 92 87 78 88  Resp: 18   16  Temp: 97.9 F (36.6 C) 98.6 F (37 C) 97.8 F (36.6 C) 97.7 F (36.5 C)  TempSrc: Oral Oral Oral Oral  SpO2: 96% 100% 100% 100%  Weight:   84.9 kg   Height:        Intake/Output Summary (Last 24 hours) at 03/29/2019 0750 Last data filed at 03/29/2019 0741 Gross per 24 hour  Intake 480 ml  Output 2600 ml  Net -2120 ml   Filed Weights   03/27/19 0412 03/28/19 0437 03/29/19 0352  Weight: 88.4 kg 86.5 kg 84.9 kg    Examination:  General exam: Appears calm and comfortable  Respiratory system: diminished breath sounds b/l.  Cardiovascular system: S1 & S2 +. No  rubs, gallops or clicks. B/l LE edema Gastrointestinal system: Abdomen is nondistended, soft and nontender. . Normal bowel sounds heard. Central nervous system: Alert and oriented. Moves all 4 extremities  Psychiatry: Judgement and insight appear normal. Flat mood and affect.     Data Reviewed: I have personally reviewed following labs and imaging studies  CBC: Recent Labs  Lab 03/26/19 1702 03/27/19 0532 03/28/19 0605 03/29/19 0504  WBC 4.0 4.4 4.1 3.9*  NEUTROABS 2.5  --   --   --   HGB 10.4* 9.2* 10.0* 8.9*  HCT 32.8* 30.2* 31.3*  28.0*  MCV 89.9 93.2 90.5 89.5  PLT 211 248 298 010   Basic Metabolic Panel: Recent Labs  Lab 03/26/19 1702 03/27/19 0532 03/28/19 0605 03/28/19 2329 03/29/19 0504  NA 139 139 137  --  138  K 4.0 4.0 3.6  --  3.3*  CL 100 100 98  --  98  CO2 28 24 27   --  29  GLUCOSE 174* 246* 282*  --  235*  BUN 13 18 25*  --  18  CREATININE 1.82* 2.13* 2.94*  --  2.08*  CALCIUM 8.4* 8.4* 8.8*  --  8.4*  PHOS  --   --   --  2.8  --    GFR: Estimated Creatinine Clearance: 39.4  mL/min (A) (by C-G formula based on SCr of 2.08 mg/dL (H)). Liver Function Tests: Recent Labs  Lab 03/26/19 1702  AST 26  ALT 15  ALKPHOS 114  BILITOT 1.0  PROT 7.2  ALBUMIN 2.8*   No results for input(s): LIPASE, AMYLASE in the last 168 hours. No results for input(s): AMMONIA in the last 168 hours. Coagulation Profile: No results for input(s): INR, PROTIME in the last 168 hours. Cardiac Enzymes: No results for input(s): CKTOTAL, CKMB, CKMBINDEX, TROPONINI in the last 168 hours. BNP (last 3 results) No results for input(s): PROBNP in the last 8760 hours. HbA1C: Recent Labs    03/26/19 2155  HGBA1C 8.8*   CBG: Recent Labs  Lab 03/27/19 2108 03/28/19 0750 03/28/19 1626 03/28/19 2113 03/29/19 0738  GLUCAP 244* 269* 288* 210* 211*   Lipid Profile: No results for input(s): CHOL, HDL, LDLCALC, TRIG, CHOLHDL, LDLDIRECT in the last 72 hours. Thyroid Function Tests: No results for input(s): TSH, T4TOTAL, FREET4, T3FREE, THYROIDAB in the last 72 hours. Anemia Panel: No results for input(s): VITAMINB12, FOLATE, FERRITIN, TIBC, IRON, RETICCTPCT in the last 72 hours. Sepsis Labs: Recent Labs  Lab 03/26/19 1939  PROCALCITON <0.10    Recent Results (from the past 240 hour(s))  Respiratory Panel by RT PCR (Flu A&B, Covid) - Nasopharyngeal Swab     Status: None   Collection Time: 03/19/19 10:41 AM   Specimen: Nasopharyngeal Swab  Result Value Ref Range Status   SARS Coronavirus 2 by RT PCR NEGATIVE NEGATIVE Final    Comment: (NOTE) SARS-CoV-2 target nucleic acids are NOT DETECTED. The SARS-CoV-2 RNA is generally detectable in upper respiratoy specimens during the acute phase of infection. The lowest concentration of SARS-CoV-2 viral copies this assay can detect is 131 copies/mL. A negative result does not preclude SARS-Cov-2 infection and should not be used as the sole basis for treatment or other patient management decisions. A negative result may occur with  improper  specimen collection/handling, submission of specimen other than nasopharyngeal swab, presence of viral mutation(s) within the areas targeted by this assay, and inadequate number of viral copies (<131 copies/mL). A negative result must be combined with clinical observations, patient history, and epidemiological information. The expected result is Negative. Fact Sheet for Patients:  PinkCheek.be Fact Sheet for Healthcare Providers:  GravelBags.it This test is not yet ap proved or cleared by the [place] FDA and  has been authorized for detection and/or diagnosis of SARS-CoV-2 by FDA under an Emergency Use Authorization (EUA). This EUA will remain  in effect (meaning this test can be used) for the duration of the COVID-19 declaration under Section 564(b)(1) of the Act, 21 U.S.C. section 360bbb-3(b)(1), unless the authorization is terminated or revoked sooner.    Influenza A by PCR  NEGATIVE NEGATIVE Final   Influenza B by PCR NEGATIVE NEGATIVE Final    Comment: (NOTE) The Xpert Xpress SARS-CoV-2/FLU/RSV assay is intended as an aid in  the diagnosis of influenza from Nasopharyngeal swab specimens and  should not be used as a sole basis for treatment. Nasal washings and  aspirates are unacceptable for Xpert Xpress SARS-CoV-2/FLU/RSV  testing. Fact Sheet for Patients: PinkCheek.be Fact Sheet for Healthcare Providers: GravelBags.it This test is not yet approved or cleared by the [place] FDA and  has been authorized for detection and/or diagnosis of SARS-CoV-2 by  FDA under an Emergency Use Authorization (EUA). This EUA will remain  in effect (meaning this test can be used) for the duration of the  Covid-19 declaration under Section 564(b)(1) of the Act, 21  U.S.C. section 360bbb-3(b)(1), unless the authorization is  terminated or revoked. Performed at [facility], [address] [place], [place] [address]   Respiratory Panel by RT PCR (Flu A&B, Covid) - Nasopharyngeal Swab     Status: None   Collection Time: 03/26/19  6:40 PM   Specimen: Nasopharyngeal Swab  Result Value Ref Range Status   SARS Coronavirus 2 by RT PCR NEGATIVE NEGATIVE Final    Comment: (NOTE) SARS-CoV-2 target nucleic acids are NOT DETECTED. The SARS-CoV-2 RNA is generally detectable in upper respiratoy specimens during the acute phase of infection. The lowest concentration of SARS-CoV-2 viral copies this assay can detect is 131 copies/mL. A negative result does not preclude SARS-Cov-2 infection and should not be used as the sole basis for treatment or other patient management decisions. A negative result may occur with  improper specimen collection/handling, submission of specimen other than nasopharyngeal swab, presence of viral mutation(s) within the areas targeted by this assay, and inadequate number of viral copies (<131 copies/mL). A negative result must be combined with clinical observations, patient history, and epidemiological information. The expected result is Negative. Fact Sheet for Patients:  PinkCheek.be Fact Sheet for Healthcare Providers:  GravelBags.it This test is not yet ap proved or cleared by the [place] FDA and  has been authorized for detection and/or diagnosis of SARS-CoV-2 by FDA under an Emergency Use Authorization (EUA). This EUA will remain  in effect (meaning this test can be used) for the duration of the COVID-19 declaration under Section 564(b)(1) of the Act, 21 U.S.C. section 360bbb-3(b)(1), unless the authorization is terminated or revoked sooner.    Influenza A by PCR NEGATIVE NEGATIVE Final   Influenza B by PCR NEGATIVE NEGATIVE Final    Comment: (NOTE) The Xpert Xpress SARS-CoV-2/FLU/RSV assay is intended as an aid in  the diagnosis of influenza from  Nasopharyngeal swab specimens and  should not be used as a sole basis for treatment. Nasal washings and  aspirates are unacceptable for Xpert Xpress SARS-CoV-2/FLU/RSV  testing. Fact Sheet for Patients: PinkCheek.be Fact Sheet for Healthcare Providers: GravelBags.it This test is not yet approved or cleared by the [place] FDA and  has been authorized for detection and/or diagnosis of SARS-CoV-2 by  FDA under an Emergency Use Authorization (EUA). This EUA will remain  in effect (meaning this test can be used) for the duration of the  Covid-19 declaration under Section 564(b)(1) of the Act, 21  U.S.C. section 360bbb-3(b)(1), unless the authorization is  terminated or revoked. Performed at [facility], [address], [place], [place] [address]   MRSA PCR Screening     Status: None   Collection Time: 03/26/19  8:03 PM  Specimen: Nasal Mucosa; Nasopharyngeal  Result Value Ref Range Status   MRSA by PCR NEGATIVE NEGATIVE Final    Comment:        The GeneXpert MRSA Assay (FDA approved for NASAL specimens only), is one component of a comprehensive MRSA colonization surveillance program. It is not intended to diagnose MRSA infection nor to guide or monitor treatment for MRSA infections. Performed at [facility], [address] [place], [place] [address]          Radiology Studies: DG Chest 2 View  Result Date: 03/28/2019 CLINICAL DATA:  End-stage renal disease EXAM: CHEST - 2 VIEW COMPARISON:  03/26/2019 FINDINGS: Cardiac shadow is mildly enlarged but stable. Postsurgical changes and dialysis catheter are again seen. Bilateral pleural effusions are seen stable in appearance from the prior exam. Mild vascular congestion is again noted but slightly improved. No focal confluent infiltrate is seen. IMPRESSION: Slight improvement in the degree of vascular congestion. Bilateral pleural effusions stable  from the prior exam. Electronically Signed   By: Inez Catalina M.D.   On: 03/28/2019 14:22        Scheduled Meds: . albuterol  5 mg Nebulization Once  . amiodarone  200 mg Oral Daily  . apixaban  5 mg Oral BID  . aspirin EC  81 mg Oral Daily  . Chlorhexidine Gluconate Cloth  6 each Topical q morning - 10a  . dronabinol  5 mg Oral BID AC  . DULoxetine  30 mg Oral Daily  . feeding supplement (NEPRO CARB STEADY)  237 mL Oral BID BM  . ferrous sulfate  325 mg Oral BID WC  . folic acid  1 mg Oral Daily  . furosemide  80 mg Intravenous BID  . influenza vac split quadrivalent PF  0.5 mL Intramuscular Tomorrow-1000  . insulin aspart  0-15 Units Subcutaneous TID WC  . insulin aspart  0-5 Units Subcutaneous QHS  . insulin detemir  5 Units Subcutaneous QHS  . metoprolol succinate  12.5 mg Oral Daily  . multivitamin  1 tablet Oral QHS  . nicotine  21 mg Transdermal Daily  . polyethylene glycol  17 g Oral Daily  . rosuvastatin  10 mg Oral Daily  . sodium chloride flush  3 mL Intravenous Q12H  . thiamine  100 mg Oral Daily   Continuous Infusions: . sodium chloride 250 mL (03/27/19 0504)     LOS: 3 days    Time spent: 31 mins    Wyvonnia Dusky, MD [facility] Pager [phone]  If 7PM-7AM, please contact night-coverage [email or web address] 03/29/2019, 7:50 AM

## 2019-03-29 NOTE — Care Management Important Message (Signed)
Important Message  Patient Details  Name: Craig Koch MRN: [id number] Date of Birth: 1955-10-13   Medicare Important Message Given:  Yes     [name] 03/29/2019, 11:46 AM

## 2019-03-29 NOTE — Progress Notes (Signed)
Inpatient Diabetes Program Recommendations  AACE/ADA: New Consensus Statement on Inpatient Glycemic Control (2015)  Target Ranges:  Prepandial:   less than 140 mg/dL      Peak postprandial:   less than 180 mg/dL (1-2 hours)      Critically ill patients:  140 - 180 mg/dL   Lab Results  Component Value Date   GLUCAP 297 (H) 03/29/2019   HGBA1C 8.8 (H) 03/26/2019    Review of Glycemic Control Results for LEMARCUS, Craig Koch (MRN [id number]) as of 03/29/2019 12:03  Ref. Range 03/28/2019 07:50 03/28/2019 16:26 03/28/2019 21:13 03/29/2019 07:38 03/29/2019 11:14  Glucose-Capillary Latest Ref Range: 70 - 99 mg/dL 269 (H) 288 (H) 210 (H) 211 (H) 297 (H)  Diabetes history: DM2 Outpatient Diabetes medications: Levemir 20 units QHS, Novolog 0-9 units TID with meals Current orders for Inpatient glycemic control: Novolog 0-15 units TID with meals, Novolog 0-5 units QHS, Levemir 5 units q HS  Inpatient Diabetes Program Recommendations:  Consider increasing Levemir to 12 units daily.   Thanks  Adah Perl, RN, BC-ADM Inpatient Diabetes Coordinator Pager [phone] (8a-5p)

## 2019-03-29 NOTE — Progress Notes (Signed)
Pt has been on room air for about 5 hours and oxygen saturation is 99%.  Pt states he feels good.

## 2019-03-29 NOTE — Progress Notes (Signed)
Spoke with patient wife Craig Koch about discharge plan. Wife stated that patient will be going home and not to [facility] at discharge. Discussed concern about transportation and getting patient to treatments. Wife stated that as long as patient can walk with walker, she was confident that she could get him in and out to car. Plan at this time is to continue treatments at [facility]. If transportation becomes an issue. DC advised that she communicates this with clinic to maybe have patient transferred to a clinic within the [facility] to set up transportation.

## 2019-03-29 NOTE — Progress Notes (Signed)
[place], [place] 03/29/19  Subjective:   LOS: 3 03/25 0701 - 03/26 0700 In: 480 [P.O.:480] Out: 2475 [Urine:475]  Patient known to our practice from outpatient dialysis He presented to the emergency room via [facility] from dialysis.  He was sent for shortness of breath after finishing dialysis.  He reports as his sats were low which improved with oxygen.  He also reports increasing lower extremity edema Hospital course: Overall feeling a little better.  Able to eat.  No nausea or vomiting reported 2 L of fluid was removed with hemodialysis yesterday Patient reports good urine output today Lower extremity edema is better He worked with physical therapy today but became hypoxic  Objective:  Vital signs in last 24 hours:  Temp:  [97.7 F (36.5 C)-98.6 F (37 C)] 97.8 F (36.6 C) (03/26 1547) Pulse Rate:  [78-88] 83 (03/26 1547) Resp:  [16-18] 18 (03/26 1547) BP: (106-125)/(54-78) 116/67 (03/26 1547) SpO2:  [96 %-100 %] 97 % (03/26 1547) Weight:  [84.9 kg] 84.9 kg (03/26 0352)  Weight change: -1.542 kg Filed Weights   03/27/19 0412 03/28/19 0437 03/29/19 0352  Weight: 88.4 kg 86.5 kg 84.9 kg    Intake/Output:    Intake/Output Summary (Last 24 hours) at 03/29/2019 1751 Last data filed at 03/29/2019 1558 Gross per 24 hour  Intake 240 ml  Output 1300 ml  Net -1060 ml     Physical Exam: General:  No acute distress,    HEENT  anicteric, moist oral mucous membrane  Pulm/lungs  normal breathing effort, decreased BS at bases, room air  CVS/Heart  irregular, tachycardic  Abdomen:   Soft, nontender  Extremities:  Trace pitting edema bilaterally  Neurologic:  Alert, oriented  Skin:  No acute rashes  Access:  PermCath       Basic Metabolic Panel:  Recent Labs  Lab 03/26/19 1702 03/26/19 1702 03/27/19 0532 03/28/19 0605 03/28/19 2329 03/29/19 0504  NA 139  --  139 137  --  138  K 4.0  --  4.0 3.6  --  3.3*  CL 100  --   100 98  --  98  CO2 28  --  24 27  --  29  GLUCOSE 174*  --  246* 282*  --  235*  BUN 13  --  18 25*  --  18  CREATININE 1.82*  --  2.13* 2.94*  --  2.08*  CALCIUM 8.4*   < > 8.4* 8.8*  --  8.4*  PHOS  --   --   --   --  2.8  --    < > = values in this interval not displayed.     CBC: Recent Labs  Lab 03/26/19 1702 03/27/19 0532 03/28/19 0605 03/29/19 0504  WBC 4.0 4.4 4.1 3.9*  NEUTROABS 2.5  --   --   --   HGB 10.4* 9.2* 10.0* 8.9*  HCT 32.8* 30.2* 31.3* 28.0*  MCV 89.9 93.2 90.5 89.5  PLT 211 248 298 277      Lab Results  Component Value Date   HEPBSAG NON REACTIVE 03/11/2019   HEPBIGM NON REACTIVE 03/11/2019      Microbiology:  Recent Results (from the past 240 hour(s))  Respiratory Panel by RT PCR (Flu A&B, Covid) - Nasopharyngeal Swab     Status: None   Collection Time: 03/26/19  6:40 PM   Specimen: Nasopharyngeal Swab  Result Value Ref Range Status   SARS Coronavirus 2 by  RT PCR NEGATIVE NEGATIVE Final    Comment: (NOTE) SARS-CoV-2 target nucleic acids are NOT DETECTED. The SARS-CoV-2 RNA is generally detectable in upper respiratoy specimens during the acute phase of infection. The lowest concentration of SARS-CoV-2 viral copies this assay can detect is 131 copies/mL. A negative result does not preclude SARS-Cov-2 infection and should not be used as the sole basis for treatment or other patient management decisions. A negative result may occur with  improper specimen collection/handling, submission of specimen other than nasopharyngeal swab, presence of viral mutation(s) within the areas targeted by this assay, and inadequate number of viral copies (<131 copies/mL). A negative result must be combined with clinical observations, patient history, and epidemiological information. The expected result is Negative. Fact Sheet for Patients:  PinkCheek.be Fact Sheet for Healthcare Providers:   GravelBags.it This test is not yet ap proved or cleared by the [place] FDA and  has been authorized for detection and/or diagnosis of SARS-CoV-2 by FDA under an Emergency Use Authorization (EUA). This EUA will remain  in effect (meaning this test can be used) for the duration of the COVID-19 declaration under Section 564(b)(1) of the Act, 21 U.S.C. section 360bbb-3(b)(1), unless the authorization is terminated or revoked sooner.    Influenza A by PCR NEGATIVE NEGATIVE Final   Influenza B by PCR NEGATIVE NEGATIVE Final    Comment: (NOTE) The Xpert Xpress SARS-CoV-2/FLU/RSV assay is intended as an aid in  the diagnosis of influenza from Nasopharyngeal swab specimens and  should not be used as a sole basis for treatment. Nasal washings and  aspirates are unacceptable for Xpert Xpress SARS-CoV-2/FLU/RSV  testing. Fact Sheet for Patients: PinkCheek.be Fact Sheet for Healthcare Providers: GravelBags.it This test is not yet approved or cleared by the [place] FDA and  has been authorized for detection and/or diagnosis of SARS-CoV-2 by  FDA under an Emergency Use Authorization (EUA). This EUA will remain  in effect (meaning this test can be used) for the duration of the  Covid-19 declaration under Section 564(b)(1) of the Act, 21  U.S.C. section 360bbb-3(b)(1), unless the authorization is  terminated or revoked. Performed at [facility], [address] [place], [place] [address]   MRSA PCR Screening     Status: None   Collection Time: 03/26/19  8:03 PM   Specimen: Nasal Mucosa; Nasopharyngeal  Result Value Ref Range Status   MRSA by PCR NEGATIVE NEGATIVE Final    Comment:        The GeneXpert MRSA Assay (FDA approved for NASAL specimens only), is one component of a comprehensive MRSA colonization surveillance program. It is not intended to diagnose MRSA infection nor to  guide or monitor treatment for MRSA infections. Performed at [facility], [address] [place], [place] [address]     Coagulation Studies: No results for input(s): LABPROT, INR in the last 72 hours.  Urinalysis: No results for input(s): COLORURINE, LABSPEC, PHURINE, GLUCOSEU, HGBUR, BILIRUBINUR, KETONESUR, PROTEINUR, UROBILINOGEN, NITRITE, LEUKOCYTESUR in the last 72 hours.  Invalid input(s): APPERANCEUR    Imaging: DG Chest 2 View  Result Date: 03/28/2019 CLINICAL DATA:  End-stage renal disease EXAM: CHEST - 2 VIEW COMPARISON:  03/26/2019 FINDINGS: Cardiac shadow is mildly enlarged but stable. Postsurgical changes and dialysis catheter are again seen. Bilateral pleural effusions are seen stable in appearance from the prior exam. Mild vascular congestion is again noted but slightly improved. No focal confluent infiltrate is seen. IMPRESSION: Slight improvement in the degree of vascular congestion. Bilateral pleural effusions  stable from the prior exam. Electronically Signed   By: Inez Catalina M.D.   On: 03/28/2019 14:22   DG Chest Port 1 View  Result Date: 03/29/2019 CLINICAL DATA:  Post thoracentesis. EXAM: PORTABLE CHEST 1 VIEW COMPARISON:  03/28/2019. FINDINGS: Post thoracentesis chest x-ray reveals no evidence of pneumothorax. Significant reduction in right-sided pleural effusion. Small left pleural effusion again noted. Bibasilar atelectasis/infiltrates again noted. Prior CABG. Right IJ line stable position. IMPRESSION: Post thoracentesis chest x-ray reveals no evidence of pneumothorax. Significant reduction in right-sided pleural effusion. Electronically Signed   By: Marcello Moores  Register   On: 03/29/2019 15:01   US THORACENTESIS ASP PLEURAL SPACE W/IMG GUIDE  Result Date: 03/29/2019 INDICATION: Pleural effusion. EXAM: ULTRASOUND GUIDED RIGHT THORACENTESIS MEDICATIONS: None. COMPLICATIONS: None immediate. PROCEDURE: An ultrasound guided thoracentesis was thoroughly discussed  with the patient and questions answered. The benefits, risks, alternatives and complications were also discussed. The patient understands and wishes to proceed with the procedure. Written consent was obtained. Ultrasound was performed to localize and mark an adequate pocket of fluid in the right chest. The area was then prepped and draped in the normal sterile fashion. 1% Lidocaine was used for local anesthesia. Under ultrasound guidance a 6 French catheter was introduced. Thoracentesis was performed. The catheter was removed and a dressing applied. FINDINGS: A total of approximately 1 L of yellow fluid was removed. Samples were sent to the laboratory as requested by the clinical team. IMPRESSION: Successful ultrasound guided right thoracentesis yielding 1 L of pleural fluid. Electronically Signed   By: Marcello Moores  Register   On: 03/29/2019 15:04     Medications:   . sodium chloride 250 mL (03/27/19 0504)   . albuterol  5 mg Nebulization Once  . amiodarone  200 mg Oral Daily  . apixaban  5 mg Oral BID  . aspirin EC  81 mg Oral Daily  . Chlorhexidine Gluconate Cloth  6 each Topical q morning - 10a  . dronabinol  5 mg Oral BID AC  . DULoxetine  30 mg Oral Daily  . feeding supplement (NEPRO CARB STEADY)  237 mL Oral BID BM  . ferrous sulfate  325 mg Oral BID WC  . folic acid  1 mg Oral Daily  . furosemide  80 mg Intravenous BID  . influenza vac split quadrivalent PF  0.5 mL Intramuscular Tomorrow-1000  . insulin aspart  0-15 Units Subcutaneous TID WC  . insulin aspart  0-5 Units Subcutaneous QHS  . insulin detemir  12 Units Subcutaneous QHS  . metoprolol succinate  12.5 mg Oral Daily  . multivitamin  1 tablet Oral QHS  . nicotine  21 mg Transdermal Daily  . polyethylene glycol  17 g Oral Daily  . rosuvastatin  10 mg Oral Daily  . sodium chloride flush  3 mL Intravenous Q12H  . thiamine  100 mg Oral Daily   sodium chloride, acetaminophen, ondansetron (ZOFRAN) IV, ondansetron, sodium chloride,  sodium chloride flush  Assessment/ Plan:  64 y.o. male with hypertension, hyperlipidemia, diabetes, depression, anxiety, adenocarcinoma of the lung stage IV, coronary disease, history of CABG, tobacco and h/o alcohol abuse, left pleural effusion admitted with SOB (shortness of breath) [R06.02] Hypoxia [R09.02] Acute on chronic combined systolic (congestive) and diastolic (congestive) heart failure (HCC) [I50.43]   Active Problems:   Diabetes mellitus with complication (HCC)   Essential hypertension   CAD (coronary artery disease), native coronary artery   Stage 4 lung cancer (HCC)   Acute on chronic combined systolic and diastolic  CHF (congestive heart failure) ([place])   HCAP (healthcare-associated pneumonia)   Hypoxemia   ESRD on hemodialysis ([place])   Hx of CABG   AF (paroxysmal atrial fibrillation) (HCC)   Acute on chronic combined systolic (congestive) and diastolic (congestive) heart failure ([place])  [facility]/ [address]/ [phone]  #. ARF Requiring renal replacement therapy Started in early March Next hemodialysis treatment scheduled for Saturday  #. Anemia of CKD  Lab Results  Component Value Date   HGB 8.9 (L) 03/29/2019   Holding Epogen due to malignancy  #. SHPTH     Component Value Date/Time   PTH 65 03/10/2019 1350   Lab Results  Component Value Date   PHOS 2.8 03/28/2019   Monitor calcium and phos level during this admission  #.  Shortness of breath, LE edema Differential diagnosis includes arrhythmia during dialysis, worsening of pleural effusions and compressive atelectasis,volume overload, acute coronary event Work-up in progress Thoracentesis planned for today Change diuretic to torsemide orally starting tomorrow   #. Diabetes type 2 with CKD Hgb A1c MFr Bld (%)  Date Value  03/26/2019 8.8 (H)     LOS: [place] 3/26/20215:51 PM  [place], [place]

## 2019-03-29 NOTE — Evaluation (Signed)
Physical Therapy Evaluation Patient Details Name: Craig Koch MRN: [id number] DOB: [birth year] Today's Date: 03/29/2019   History of Present Illness  Pt is a 64 y.o. male presenting to hospital 03/26/19 with SOB after finishing dialysis (pt started dialysis about 3 weeks ago); noted with hypoxia and increasing LE edema.  Pt admitted with hypoxia, acute on chronic combined systolic and diastolic CHF, possible HCAP, CAD, and a-fib.  PMH includes CAD with h/o CABG, HFrEF, lung CA (stage IV), htn, DM, h/o L ankle surgery, h/o thoracentesis, afib with RVR, NSTEMI, and R IJ permcath placement 3/11.  Clinical Impression  Prior to hospital admission, pt was at [facility] ambulating with RW; lives with his wife in 1 level home with 3 steps to enter (B railings).  Currently pt is CGA with transfers and ambulation 120 feet and then 40 feet with RW (sitting rest break between ambulation trials; distance ambulating limited d/t SOB and fatigue).  Pt's O2 sats on room air 90-91% with ambulation and 95% or greater at rest on room air.  Pt would benefit from skilled PT to address noted impairments and functional limitations (see below for any additional details).  Upon hospital discharge, pt would benefit from [place].    Follow Up Recommendations Home health PT;Supervision for mobility/OOB    Equipment Recommendations  Rolling walker with 5" wheels    Recommendations for Other Services OT consult     Precautions / Restrictions Precautions Precautions: None Precaution Comments: R IJ permcath Restrictions Weight Bearing Restrictions: No      Mobility  Bed Mobility       General bed mobility comments: Deferred (pt sitting in recliner upon PT arrival)  Transfers Overall transfer level: Needs assistance Equipment used: Rolling walker (2 wheeled) Transfers: Sit to/from Stand Sit to Stand: Min guard Stand pivot transfers: Min guard       General transfer comment: mild increased effort to stand up to RW  (x2 trials)  Ambulation/Gait Ambulation/Gait assistance: Min guard Gait Distance (Feet): (120 feet; 40 feet) Assistive device: Rolling walker (2 wheeled)   Gait velocity: decreased   General Gait Details: partial step through gait pattern; steady with RW use  Stairs            Wheelchair Mobility    Modified Rankin (Stroke Patients Only)       Balance Overall balance assessment: Needs assistance Sitting-balance support: No upper extremity supported;Feet supported Sitting balance-Leahy Scale: Normal Sitting balance - Comments: steady sitting reaching outside BOS   Standing balance support: No upper extremity supported Standing balance-Leahy Scale: Fair Standing balance comment: steady static standing no UE support briefly                             Pertinent Vitals/Pain Pain Assessment: No/denies pain Pain Intervention(s): Limited activity within patient's tolerance;Monitored during session;Repositioned  HR WFL during sessions activities.    Home Living Family/patient expects to be discharged to:: Private residence Living Arrangements: Spouse/significant other Available Help at Discharge: Family(wife works; has son and daughter that live close by) Type of Home: House Home Access: Stairs to enter Entrance Stairs-Rails: Right;Left;Can reach both Technical brewer of Steps: [place]: [place]: [place] - single point;Walker - standard      Prior Function Level of Independence: Needs assistance   Gait / Transfers Assistance Needed: Has been ambulating with RW at STR.     Comments: Pt reports his mobility has been improving at STR.  Hand Dominance   Dominant Hand: Right    Extremity/Trunk Assessment   Upper Extremity Assessment Upper Extremity Assessment: Defer to OT evaluation RUE Deficits / Details: Per OT note "AROM WFL grossly. Grip 5/5. 5 finger opposition intact c slowed movements. Pt reports occassional B  hand tremor, not noted this session, and "less nimble" fingers - able to open plastic spoon package c increased time." LUE Deficits / Details: Per OT note "AROM WFL grossly. Grip 5/5. 5 finger opposition intact c slowed movements. Pt reports occassional B hand tremor, not noted this session, and "less nimble" fingers - able to open plastic spoon package c increased time."    Lower Extremity Assessment Lower Extremity Assessment: Generalized weakness RLE Deficits / Details: LE edema noted LLE Deficits / Details: LE edema noted    Cervical / Trunk Assessment Cervical / Trunk Assessment: Normal  Communication   Communication: No difficulties  Cognition Arousal/Alertness: Awake/alert Behavior During Therapy: WFL for tasks assessed/performed Overall Cognitive Status: Within Functional Limits for tasks assessed                                        General Comments General comments (skin integrity, edema, etc.): B LE edema noted.  Nursing cleared pt for participation in physical therapy.  Pt agreeable to PT session.    Exercises    Assessment/Plan    PT Assessment Patient needs continued PT services  PT Problem List Decreased strength;Decreased activity tolerance;Decreased balance;Decreased mobility;Cardiopulmonary status limiting activity       PT Treatment Interventions DME instruction;Gait training;Stair training;Functional mobility training;Therapeutic activities;Therapeutic exercise;Balance training;Patient/family education    PT Goals (Current goals can be found in the Care Plan section)  Acute Rehab PT Goals Patient Stated Goal: to go home PT Goal Formulation: With patient Time For Goal Achievement: 04/12/19 Potential to Achieve Goals: Good    Frequency Min 2X/week   Barriers to discharge        Co-evaluation               AM-PAC PT "6 Clicks" Mobility  Outcome Measure Help needed turning from your back to your side while in a flat bed  without using bedrails?: None Help needed moving from lying on your back to sitting on the side of a flat bed without using bedrails?: None Help needed moving to and from a bed to a chair (including a wheelchair)?: A Little Help needed standing up from a chair using your arms (e.g., wheelchair or bedside chair)?: A Little Help needed to walk in hospital room?: A Little Help needed climbing 3-5 steps with a railing? : A Little 6 Click Score: 20    End of Session Equipment Utilized During Treatment: Gait belt Activity Tolerance: Patient limited by fatigue Patient left: in chair;with call bell/phone within reach Nurse Communication: Mobility status;Precautions(Pt fall risk score 9: no chair alarm in place) PT Visit Diagnosis: Muscle weakness (generalized) (M62.81);Difficulty in walking, not elsewhere classified (R26.2)    Time: 6644-0347 PT Time Calculation (min) (ACUTE ONLY): 20 min   Charges:   PT Evaluation $PT Eval Low Complexity: 1 Low PT Treatments $Therapeutic Exercise: 8-22 mins       Leitha Bleak, PT 03/29/19, 1:45 PM

## 2019-03-29 NOTE — Evaluation (Signed)
Occupational Therapy Evaluation Patient Details Name: Craig Koch MRN: [id number] DOB: Jul 18, 1955 Today's Date: 03/29/2019    History of Present Illness  Craig Koch is a 64 y.o. male with medical history significant for stage IV lung cancer, ESRD on HD MWF, starting dialysis 3 weeks ago, paroxysmal A. fib , CAD with history of CABG, with recent NSTEMI, chronic combined systolic and diastolic heart failure, uncontrolled diabetes with last A1c 11.3, history of hypertension, recently hospitalized about 2 weeks prior for fluid overload and heart failure exacerbation, when dialysis was initiated, who presents to the emergency room after developing shortness of breath after his dialysis session today.  According to his wife who gives a lot of the history, allowing his recent discharge he had been coming along well but patient was noted to have progressing lower extremity edema.   Clinical Impression   Craig Koch was seen for OT evaluation this date. Prior to hospital admission, pt was in SNF. Pt lives in single level home with wife and has 2 children nearby. Pt presents to acute OT demonstrating impaired ADL performance and functional mobility 2/2 increased edema, decreased activity tolerance, and functional balance deficits. Pt currently requires CGA + RW + O2 for SPT and SBA for EOB dressing ADLs. Pt would benefit from skilled OT to address noted impairments and functional limitations (see below for any additional details) in order to maximize safety and independence while minimizing falls risk and caregiver burden. Upon hospital discharge, recommend HHOT to maximize pt safety and return to functional independence during meaningful occupations of daily life.    Follow Up Recommendations  Home health OT(Supervision for OOB/Mobility)    Equipment Recommendations  3 in 1 bedside commode;Tub/shower bench;Other (comment)(grab bars )    Recommendations for Other Services       Precautions /  Restrictions Precautions Precautions: None Restrictions Weight Bearing Restrictions: No      Mobility Bed Mobility Overal bed mobility: Modified Independent Bed Mobility: Supine to Sit;Sit to Supine     Supine to sit: HOB elevated;Supervision Sit to supine: HOB elevated;Supervision(Increased time for LLE - pt states is "heavier")   General bed mobility comments: SpO2 stable at 95-96% on RA t/o bed mobility and sitting EOB  Transfers Overall transfer level: Needs assistance Equipment used: Rolling walker (2 wheeled) Transfers: Sit to/from [facility] Sit to Stand: Min guard Stand pivot transfers: Min guard       General transfer comment: CGA + RW sit<>stand at EOB c no LOBs, SpO2 desat to 88% on RA, resolved with 2L O2. CGA SPT from bed to recliner - slowed, unsteady movements    Balance Overall balance assessment: Needs assistance Sitting-balance support: Feet supported Sitting balance-Leahy Scale: Good     Standing balance support: Bilateral upper extremity supported Standing balance-Leahy Scale: Fair Standing balance comment: Initial sit<>stand trial pt utilized BUE on RW. Second sit<>stand trail maintained balance w/o RW but less steady                           ADL either performed or assessed with clinical judgement   ADL Overall ADL's : Needs assistance/impaired                                       General ADL Comments: SBA + RW simulated BSC transfer. SBA Julis/doff B socks sitting EOB. MIN A Craig Koch/doff  gown seated EOB - assist for tying/untying in back. IND self-feeding reclined in bed     Vision Baseline Vision/History: Wears glasses;Cataracts Wears Glasses: Reading only Patient Visual Report: Blurring of vision(Pt reports intermittent blurring in his L "good eye" )       Perception     Praxis      Pertinent Vitals/Pain Pain Assessment: No/denies pain     Hand Dominance Right   Extremity/Trunk Assessment  Upper Extremity Assessment Upper Extremity Assessment: RUE deficits/detail;LUE deficits/detail RUE Deficits / Details: AROM WFL grossly. Grip 5/5. 5 finger opposition intact c slowed movements. Pt reports occassional B hand tremor, not noted this session, and "less nimble" fingers - able to open plastic spoon package c increased time.  LUE Deficits / Details: AROM WFL grossly. Grip 5/5. 5 finger opposition intact c slowed movements. Pt reports occassional B hand tremor, not noted this session, and "less nimble" fingers - able to open plastic spoon package c increased time.    Lower Extremity Assessment Lower Extremity Assessment: RLE deficits/detail;LLE deficits/detail RLE Deficits / Details: LE edema noted especially foot/ankle LLE Deficits / Details: LE edema noted especially foot/ankle. Pt states LLE is "heavier"       Communication Communication Communication: No difficulties   Cognition Arousal/Alertness: Awake/alert Behavior During Therapy: WFL for tasks assessed/performed Overall Cognitive Status: Within Functional Limits for tasks assessed                                     General Comments  At start of session reclined in bed: SpO2 96% on RA. Seated EOB ~15 mins including ADLs and conversation c MD: 95% on RA. Standing EOB c RW: 88% on RA - unable to resolve c PLB/standing rest break/diagphramic breathing VCs, resolved with return to sitting and 2L O2. SPT: 98% on 2L O2. Pt left on 1L O2 reclined in chair SpO2 97%    Exercises Exercises: Other exercises Other Exercises Other Exercises: Pt educated re: energy conservation, DME recommendations, importance of supervision for OOB mobility, IS (provided) use and frequency, edema management, RW technique Other Exercises: UBD, LBD, sitting/standing balance/tolerance, positioning for edema management, bed mobliity, sup<>sit, sit<>stand x2, SPT, Incentive Spirometer,    Shoulder Instructions      Home Living  Family/patient expects to be discharged to:: Private residence Living Arrangements: Spouse/significant other Available Help at Discharge: Family(Wife works, son and daughter close by) Type of Home: House Home Access: Stairs to enter Technical brewer of Steps: 3 Entrance Stairs-Rails: Can reach both Home Layout: One level     Bathroom Shower/Tub: Teacher, early years/pre: Handicapped height     Home Equipment: [employer] - single point;Walker - standard(Per chart review - walker is 64 years old with no wheels)          Prior Functioning/Environment Level of Independence: Needs assistance        Comments: Pt presents from SNF following recent hospitalization (03/07/19). States his mobility/ADLs have been approving.        OT Problem List: Decreased range of motion;Decreased activity tolerance;Decreased knowledge of use of DME or AE;Increased edema      OT Treatment/Interventions: Self-care/ADL training;Therapeutic exercise;Energy conservation;DME and/or AE instruction;Therapeutic activities;Patient/family education;Balance training    OT Goals(Current goals can be found in the care plan section) Acute Rehab OT Goals Patient Stated Goal: go home OT Goal Formulation: With patient Time For Goal Achievement: 04/12/19 Potential to Achieve Goals:  Good ADL Goals Pt Will Perform Lower Body Dressing: with modified independence;sit to/from stand(c LRAD PRN) Pt Will Perform Tub/Shower Transfer: with supervision;shower seat;grab bars(c LRAD PRN) Additional ADL Goal #1: Pt will independently verbalize x3 energy conservation strategies to implement in home  OT Frequency: Min 2X/week   Barriers to D/C: Inaccessible home environment          Co-evaluation              AM-PAC OT "6 Clicks" Daily Activity     Outcome Measure Help from another person eating meals?: None Help from another person taking care of personal grooming?: A Little Help from another person  toileting, which includes using toliet, bedpan, or urinal?: A Little Help from another person bathing (including washing, rinsing, drying)?: A Little Help from another person to put on and taking off regular upper body clothing?: A Little Help from another person to put on and taking off regular lower body clothing?: A Little 6 Click Score: 19   End of Session Equipment Utilized During Treatment: Rolling walker;Oxygen  Activity Tolerance: Patient tolerated treatment well Patient left: in chair;with call bell/phone within reach;with nursing/sitter in room(NA in room at end of session)  OT Visit Diagnosis: Unsteadiness on feet (R26.81)                Time: 3559-7416 OT Time Calculation (min): 43 min Charges:  OT General Charges $OT Visit: 1 Visit OT Evaluation $OT Eval Moderate Complexity: 1 Mod OT Treatments $Self Care/Home Management : 38-52 mins  Dessie Coma, M.S. OTR/L  03/29/19, 10:53 AM

## 2019-03-30 LAB — BASIC METABOLIC PANEL
Anion gap: 11 (ref 5–15)
BUN: 28 mg/dL — ABNORMAL HIGH (ref 8–23)
CO2: 31 mmol/L (ref 22–32)
Calcium: 8.5 mg/dL — ABNORMAL LOW (ref 8.9–10.3)
Chloride: 94 mmol/L — ABNORMAL LOW (ref 98–111)
Creatinine, Ser: 2.52 mg/dL — ABNORMAL HIGH (ref 0.61–1.24)
GFR calc Af Amer: 30 mL/min — ABNORMAL LOW (ref >60)
GFR calc non Af Amer: 26 mL/min — ABNORMAL LOW (ref >60)
Glucose, Bld: 194 mg/dL — ABNORMAL HIGH (ref 70–99)
Potassium: 3.1 mmol/L — ABNORMAL LOW (ref 3.5–5.1)
Sodium: 136 mmol/L (ref 135–145)

## 2019-03-30 LAB — CBC
HCT: 27.9 % — ABNORMAL LOW (ref 39.0–52.0)
Hemoglobin: 9.2 g/dL — ABNORMAL LOW (ref 13.0–17.0)
MCH: 28.8 pg (ref 26.0–34.0)
MCHC: 33 g/dL (ref 30.0–36.0)
MCV: 87.5 fL (ref 80.0–100.0)
Platelets: 297 10*3/uL (ref 150–400)
RBC: 3.19 MIL/uL — ABNORMAL LOW (ref 4.22–5.81)
RDW: 17.3 % — ABNORMAL HIGH (ref 11.5–15.5)
WBC: 3.7 10*3/uL — ABNORMAL LOW (ref 4.0–10.5)
nRBC: 0 % (ref 0.0–0.2)

## 2019-03-30 LAB — GLUCOSE, CAPILLARY
Glucose-Capillary: 168 mg/dL — ABNORMAL HIGH (ref 70–99)
Glucose-Capillary: 142 mg/dL — ABNORMAL HIGH (ref 70–99)
Glucose-Capillary: 241 mg/dL — ABNORMAL HIGH (ref 70–99)
Glucose-Capillary: 215 mg/dL — ABNORMAL HIGH (ref 70–99)

## 2019-03-30 MED ORDER — ALBUMIN HUMAN 25 % IV SOLN
25.0000 g | Freq: Once | INTRAVENOUS | Status: AC
  Administered 2019-03-30: 25 g via INTRAVENOUS
  Filled 2019-03-30: qty 100

## 2019-03-30 MED ORDER — SODIUM CHLORIDE 0.9 % IV BOLUS
250.0000 mL | Freq: Once | INTRAVENOUS | Status: AC
  Administered 2019-03-30: 250 mL via INTRAVENOUS

## 2019-03-30 NOTE — Progress Notes (Signed)
Notified B. Randol Kern, NP of [place].

## 2019-03-30 NOTE — Progress Notes (Signed)
Craig Koch  MRN: [id number]  DOB/AGE: 1955/10/17 64 y.o.  Primary Care Physician:Duncan, Elveria Rising, MD  Admit date: 03/26/2019  Chief Complaint:  Chief Complaint  Patient presents with  . Shortness of Breath    S-Pt presented on  03/26/2019 with  Chief Complaint  Patient presents with  . Shortness of Breath  . Patient seen on dialysis.  Patient tolerating treatment well.  Patient main query was" if kidneys will get better?' I answered patient questions to the best of my ability  Medications . albuterol  5 mg Nebulization Once  . amiodarone  200 mg Oral Daily  . apixaban  5 mg Oral BID  . aspirin EC  81 mg Oral Daily  . Chlorhexidine Gluconate Cloth  6 each Topical q morning - 10a  . dronabinol  5 mg Oral BID AC  . DULoxetine  30 mg Oral Daily  . feeding supplement (NEPRO CARB STEADY)  237 mL Oral BID BM  . ferrous sulfate  325 mg Oral BID WC  . folic acid  1 mg Oral Daily  . influenza vac split quadrivalent PF  0.5 mL Intramuscular Tomorrow-1000  . insulin aspart  0-15 Units Subcutaneous TID WC  . insulin aspart  0-5 Units Subcutaneous QHS  . insulin detemir  12 Units Subcutaneous QHS  . metoprolol succinate  12.5 mg Oral Daily  . multivitamin  1 tablet Oral QHS  . nicotine  21 mg Transdermal Daily  . polyethylene glycol  17 g Oral Daily  . rosuvastatin  10 mg Oral Daily  . sodium chloride flush  3 mL Intravenous Q12H  . thiamine  100 mg Oral Daily  . torsemide  40 mg Oral Daily         WUJ:WJXBJ from the symptoms mentioned above,there are no other symptoms referable to all systems reviewed.  Physical Exam: Vital signs in last 24 hours: Temp:  [97.8 F (36.6 C)-98.3 F (36.8 C)] 98.3 F (36.8 C) (03/27 0750) Pulse Rate:  [80-95] 95 (03/27 1145) Resp:  [14-23] 19 (03/27 1145) BP: (104-128)/(54-84) 128/71 (03/27 1145) SpO2:  [96 %-99 %] 99 % (03/27 1015) Weight:  [83.2 kg] 83.2 kg (03/27 0400) Weight change: -1.724 kg Last BM Date:  03/27/19  Intake/Output from previous day: 03/26 0701 - 03/27 0700 In: -  Out: 2500 [Urine:2500] No intake/output data recorded.   Physical Exam: General- pt is awake,alert, oriented to time place and person Resp- No acute REsp distress, CTA B/L NO Rhonchi CVS- S1S2 regular in rate and rhythm GIT- BS+, soft, NT, ND EXT- NO LE Edema, Cyanosis Access tunneled catheter in situ  Lab Results: CBC Recent Labs    03/29/19 0504 03/30/19 0512  WBC 3.9* 3.7*  HGB 8.9* 9.2*  HCT 28.0* 27.9*  PLT 277 297    BMET Recent Labs    03/29/19 0504 03/30/19 0512  NA 138 136  K 3.3* 3.1*  CL 98 94*  CO2 29 31  GLUCOSE 235* 194*  BUN 18 28*  CREATININE 2.08* 2.52*  CALCIUM 8.4* 8.5*    MICRO Recent Results (from the past 240 hour(s))  Respiratory Panel by RT PCR (Flu A&B, Covid) - Nasopharyngeal Swab     Status: None   Collection Time: 03/26/19  6:40 PM   Specimen: Nasopharyngeal Swab  Result Value Ref Range Status   SARS Coronavirus 2 by RT PCR NEGATIVE NEGATIVE Final    Comment: (NOTE) SARS-CoV-2 target nucleic acids are NOT DETECTED. The SARS-CoV-2 RNA is generally detectable  in upper respiratoy specimens during the acute phase of infection. The lowest concentration of SARS-CoV-2 viral copies this assay can detect is 131 copies/mL. A negative result does not preclude SARS-Cov-2 infection and should not be used as the sole basis for treatment or other patient management decisions. A negative result may occur with  improper specimen collection/handling, submission of specimen other than nasopharyngeal swab, presence of viral mutation(s) within the areas targeted by this assay, and inadequate number of viral copies (<131 copies/mL). A negative result must be combined with clinical observations, patient history, and epidemiological information. The expected result is Negative. Fact Sheet for Patients:  PinkCheek.be Fact Sheet for Healthcare  Providers:  GravelBags.it This test is not yet ap proved or cleared by the [place] FDA and  has been authorized for detection and/or diagnosis of SARS-CoV-2 by FDA under an Emergency Use Authorization (EUA). This EUA will remain  in effect (meaning this test can be used) for the duration of the COVID-19 declaration under Section 564(b)(1) of the Act, 21 U.S.C. section 360bbb-3(b)(1), unless the authorization is terminated or revoked sooner.    Influenza A by PCR NEGATIVE NEGATIVE Final   Influenza B by PCR NEGATIVE NEGATIVE Final    Comment: (NOTE) The Xpert Xpress SARS-CoV-2/FLU/RSV assay is intended as an aid in  the diagnosis of influenza from Nasopharyngeal swab specimens and  should not be used as a sole basis for treatment. Nasal washings and  aspirates are unacceptable for Xpert Xpress SARS-CoV-2/FLU/RSV  testing. Fact Sheet for Patients: PinkCheek.be Fact Sheet for Healthcare Providers: GravelBags.it This test is not yet approved or cleared by the [place] FDA and  has been authorized for detection and/or diagnosis of SARS-CoV-2 by  FDA under an Emergency Use Authorization (EUA). This EUA will remain  in effect (meaning this test can be used) for the duration of the  Covid-19 declaration under Section 564(b)(1) of the Act, 21  U.S.C. section 360bbb-3(b)(1), unless the authorization is  terminated or revoked. Performed at [facility], [address] [place], [place] [address]   MRSA PCR Screening     Status: None   Collection Time: 03/26/19  8:03 PM   Specimen: Nasal Mucosa; Nasopharyngeal  Result Value Ref Range Status   MRSA by PCR NEGATIVE NEGATIVE Final    Comment:        The GeneXpert MRSA Assay (FDA approved for NASAL specimens only), is one component of a comprehensive MRSA colonization surveillance program. It is not intended to diagnose MRSA infection  nor to guide or monitor treatment for MRSA infections. Performed at [facility], [address] [place], [place] [address]   Body fluid culture     Status: None (Preliminary result)   Collection Time: 03/29/19  2:30 PM   Specimen: PATH Cytology Pleural fluid  Result Value Ref Range Status   Specimen Description   Final    PLEURAL Performed at [facility], [address], [place], [place] [address]    Special Requests   Final    NONE Performed at [facility], [address] [place], [place] [address]    Gram Stain   Final    FEW WBC PRESENT, PREDOMINANTLY MONONUCLEAR NO ORGANISMS SEEN    Culture   Final    NO GROWTH < 24 HOURS Performed at [facility], [place] [address] [place],  [address]    Report Status PENDING  Incomplete      Lab Results  Component Value Date  PTH 65 03/10/2019   CALCIUM 8.5 (L) 03/30/2019   PHOS 2.8 03/28/2019               Impression: 1)Renal  AKI secondary to ATN Patient had ATN during March admission. Patient had severe ATN requiring renal replacement therapy. Patient has been on dialysis since March 11, 2019. Was currently being dialyzed as an outpatient at [facility] at  [place] Patient is on Monday Wednesday Friday schedule as an outpatient As inpatient patient was dialyzed on March 25 and is currently being dialyzed  2)HTN Blood pressure is stable. Patient is on beta-blockers   3)Anemia of chronic disease  HGb at goal (9--11)   4) secondary hyperparathyroidism -CKD Mineral-Bone Disorder  Patient calcium and phosphorus are at goal. Patient had hyperphosphatemia earlier  5) shortness of breath. Patient is now s/p thoracentesis. Patient is now clinically better  6) electrolytes   sodium Normonatremic   potassium Hypokalemia We are using 4K bath    7)Acid base Co2 at goal     Plan:  We will continue current treatment plan Patient is tolerating treatment  well     Sharetta Ricchio s [facility] 03/30/2019, 12:02 PM

## 2019-03-30 NOTE — Progress Notes (Signed)
Pt given 238ml bolus and albumin.  BP104/53 MAP69.  Rachael Fee, NP notified.

## 2019-03-30 NOTE — Progress Notes (Signed)
PROGRESS NOTE    Craig Koch  ZDG:[id number] DOB: [birth year] DOA: 03/26/2019 PCP: Tonia Ghent, MD      Assessment & Plan:   Active Problems:   Diabetes mellitus with complication (HCC)   Essential hypertension   CAD (coronary artery disease), native coronary artery   Stage 4 lung cancer (HCC)   Acute on chronic combined systolic and diastolic CHF (congestive heart failure) ([place])   HCAP (healthcare-associated pneumonia)   Hypoxemia   ESRD on hemodialysis (HCC)   Hx of CABG   AF (paroxysmal atrial fibrillation) (HCC)   Acute on chronic combined systolic (congestive) and diastolic (congestive) heart failure (HCC)   Acute hypoxia respiratory failure: etiology likely multifactorial related to heart failure exacerbation, underlying stage IV lung cancer, & left pleural effusion. Weaned off of supplemental oxygen today.  Acute on chronic combined CHF: continue on lasix, metoprolol, statin. Monitor I/Os  Acute renal failure: on HD TTS. Still makes urine. Management per nephro  Hypokalemia: managed w/ HD, as per nephro.  B/l pleural effusion: will continue on IV lasix. Neg approx 2 L. S/p right thoracentesis on 03/29/19. Improved respiratory status today   Unlikely HCAP: recently treated for pneumonia during his hospitalization. Low suspicion for repeat pneumonia given afebrile and normal white cell count. IV Rocephin, azithromycin and vancomycin x1. Procalcitonin <0.10. Abxs were d/c  CAD: hx of CABG, NSTEMI 03/04/2019. Patient denies chest pain and EKG shows no acute ST-T wave changes. Continue on aspirin, metoprolol, statin  PAF: continue on amiodarone, metoprolol and eliquis.  DM2: uncontrolled, HbA1c 11.3. Continue on SSI w/ accuchecks  Essential hypertension: continue on metoprolol, amiodarone   Stage 4 lung cancer:  continue outpatient follow-up with oncology  Generalized weakness: PT/OT recs home health. Home health orders placed    DVT prophylaxis:  eliquis Code Status: DNR Family Communication: discussed w/ pt's family who was at bedside and answered their questions  Disposition Plan:  Will likely d/c back home w/ home health   Consultants:   nephro    Procedures:    Antimicrobials:   Subjective: Pt c/o fatigue. Pt is able to take full deep breaths.   Objective: Vitals:   03/29/19 2028 03/30/19 0358 03/30/19 0400 03/30/19 0750  BP: 119/65 118/67  104/66  Pulse: 86 80  95  Resp:  16  19  Temp: 98.1 F (36.7 C) 97.8 F (36.6 C)  98.3 F (36.8 C)  TempSrc: Oral Oral  Oral  SpO2: 99% 96%  99%  Weight:   83.2 kg   Height:        Intake/Output Summary (Last 24 hours) at 03/30/2019 0804 Last data filed at 03/30/2019 0400 Gross per 24 hour  Intake --  Output 2375 ml  Net -2375 ml   Filed Weights   03/28/19 0437 03/29/19 0352 03/30/19 0400  Weight: 86.5 kg 84.9 kg 83.2 kg    Examination:  General exam: Appears calm and comfortable  Respiratory system: decreased breath sounds b/l.  Cardiovascular system: S1 & S2 +. No  rubs, gallops or clicks. B/l LE edema Gastrointestinal system: Abdomen is nondistended, soft and nontender.  Normal bowel sounds heard. Central nervous system: Alert and oriented. Moves all 4 extremities  Psychiatry: Judgement and insight appear normal. Flat mood and affect.     Data Reviewed: I have personally reviewed following labs and imaging studies  CBC: Recent Labs  Lab 03/26/19 1702 03/27/19 0532 03/28/19 0605 03/29/19 0504 03/30/19 0512  WBC 4.0 4.4 4.1 3.9* 3.7*  NEUTROABS 2.5  --   --   --   --  HGB 10.4* 9.2* 10.0* 8.9* 9.2*  HCT 32.8* 30.2* 31.3* 28.0* 27.9*  MCV 89.9 93.2 90.5 89.5 87.5  PLT 211 248 298 277 341   Basic Metabolic Panel: Recent Labs  Lab 03/26/19 1702 03/27/19 0532 03/28/19 0605 03/28/19 2329 03/29/19 0504 03/30/19 0512  NA 139 139 137  --  138 136  K 4.0 4.0 3.6  --  3.3* 3.1*  CL 100 100 98  --  98 94*  CO2 28 24 27   --  29 31  GLUCOSE  174* 246* 282*  --  235* 194*  BUN 13 18 25*  --  18 28*  CREATININE 1.82* 2.13* 2.94*  --  2.08* 2.52*  CALCIUM 8.4* 8.4* 8.8*  --  8.4* 8.5*  PHOS  --   --   --  2.8  --   --    GFR: Estimated Creatinine Clearance: 32.5 mL/min (A) (by C-G formula based on SCr of 2.52 mg/dL (H)). Liver Function Tests: Recent Labs  Lab 03/26/19 1702  AST 26  ALT 15  ALKPHOS 114  BILITOT 1.0  PROT 7.2  ALBUMIN 2.8*   No results for input(s): LIPASE, AMYLASE in the last 168 hours. No results for input(s): AMMONIA in the last 168 hours. Coagulation Profile: No results for input(s): INR, PROTIME in the last 168 hours. Cardiac Enzymes: No results for input(s): CKTOTAL, CKMB, CKMBINDEX, TROPONINI in the last 168 hours. BNP (last 3 results) No results for input(s): PROBNP in the last 8760 hours. HbA1C: No results for input(s): HGBA1C in the last 72 hours. CBG: Recent Labs  Lab 03/29/19 0738 03/29/19 1114 03/29/19 1620 03/29/19 2132 03/30/19 0752  GLUCAP 211* 297* 339* 181* 168*   Lipid Profile: No results for input(s): CHOL, HDL, LDLCALC, TRIG, CHOLHDL, LDLDIRECT in the last 72 hours. Thyroid Function Tests: No results for input(s): TSH, T4TOTAL, FREET4, T3FREE, THYROIDAB in the last 72 hours. Anemia Panel: No results for input(s): VITAMINB12, FOLATE, FERRITIN, TIBC, IRON, RETICCTPCT in the last 72 hours. Sepsis Labs: Recent Labs  Lab 03/26/19 1939  PROCALCITON <0.10    Recent Results (from the past 240 hour(s))  Respiratory Panel by RT PCR (Flu A&B, Covid) - Nasopharyngeal Swab     Status: None   Collection Time: 03/26/19  6:40 PM   Specimen: Nasopharyngeal Swab  Result Value Ref Range Status   SARS Coronavirus 2 by RT PCR NEGATIVE NEGATIVE Final    Comment: (NOTE) SARS-CoV-2 target nucleic acids are NOT DETECTED. The SARS-CoV-2 RNA is generally detectable in upper respiratoy specimens during the acute phase of infection. The lowest concentration of SARS-CoV-2 viral copies this  assay can detect is 131 copies/mL. A negative result does not preclude SARS-Cov-2 infection and should not be used as the sole basis for treatment or other patient management decisions. A negative result may occur with  improper specimen collection/handling, submission of specimen other than nasopharyngeal swab, presence of viral mutation(s) within the areas targeted by this assay, and inadequate number of viral copies (<131 copies/mL). A negative result must be combined with clinical observations, patient history, and epidemiological information. The expected result is Negative. Fact Sheet for Patients:  PinkCheek.be Fact Sheet for Healthcare Providers:  GravelBags.it This test is not yet ap proved or cleared by the [place] FDA and  has been authorized for detection and/or diagnosis of SARS-CoV-2 by FDA under an Emergency Use Authorization (EUA). This EUA will remain  in effect (meaning this test can be used) for the duration of the COVID-19  declaration under Section 564(b)(1) of the Act, 21 U.S.C. section 360bbb-3(b)(1), unless the authorization is terminated or revoked sooner.    Influenza A by PCR NEGATIVE NEGATIVE Final   Influenza B by PCR NEGATIVE NEGATIVE Final    Comment: (NOTE) The Xpert Xpress SARS-CoV-2/FLU/RSV assay is intended as an aid in  the diagnosis of influenza from Nasopharyngeal swab specimens and  should not be used as a sole basis for treatment. Nasal washings and  aspirates are unacceptable for Xpert Xpress SARS-CoV-2/FLU/RSV  testing. Fact Sheet for Patients: PinkCheek.be Fact Sheet for Healthcare Providers: GravelBags.it This test is not yet approved or cleared by the [place] FDA and  has been authorized for detection and/or diagnosis of SARS-CoV-2 by  FDA under an Emergency Use Authorization (EUA). This EUA will remain  in  effect (meaning this test can be used) for the duration of the  Covid-19 declaration under Section 564(b)(1) of the Act, 21  U.S.C. section 360bbb-3(b)(1), unless the authorization is  terminated or revoked. Performed at [facility], [address] [place], [place] [address]   MRSA PCR Screening     Status: None   Collection Time: 03/26/19  8:03 PM   Specimen: Nasal Mucosa; Nasopharyngeal  Result Value Ref Range Status   MRSA by PCR NEGATIVE NEGATIVE Final    Comment:        The GeneXpert MRSA Assay (FDA approved for NASAL specimens only), is one component of a comprehensive MRSA colonization surveillance program. It is not intended to diagnose MRSA infection nor to guide or monitor treatment for MRSA infections. Performed at [facility], [address] [place], [place] [address]   Body fluid culture     Status: None (Preliminary result)   Collection Time: 03/29/19  2:30 PM   Specimen: PATH Cytology Pleural fluid  Result Value Ref Range Status   Specimen Description   Final    PLEURAL Performed at [facility], [address], [place], [place] [address]    Special Requests   Final    NONE Performed at [facility], [address] [place], [place] [address]    Gram Stain   Final    FEW WBC PRESENT, PREDOMINANTLY MONONUCLEAR NO ORGANISMS SEEN Performed at [facility], [place] [address], [place],  [address]    Culture PENDING  Incomplete   Report Status PENDING  Incomplete         Radiology Studies: DG Chest 2 View  Result Date: 03/28/2019 CLINICAL DATA:  End-stage renal disease EXAM: CHEST - 2 VIEW COMPARISON:  03/26/2019 FINDINGS: Cardiac shadow is mildly enlarged but stable. Postsurgical changes and dialysis catheter are again seen. Bilateral pleural effusions are seen stable in appearance from the prior exam. Mild vascular congestion is again noted but slightly improved. No focal confluent infiltrate is seen.  IMPRESSION: Slight improvement in the degree of vascular congestion. Bilateral pleural effusions stable from the prior exam. Electronically Signed   By: Inez Catalina M.D.   On: 03/28/2019 14:22   DG Chest Port 1 View  Result Date: 03/29/2019 CLINICAL DATA:  Post thoracentesis. EXAM: PORTABLE CHEST 1 VIEW COMPARISON:  03/28/2019. FINDINGS: Post thoracentesis chest x-ray reveals no evidence of pneumothorax. Significant reduction in right-sided pleural effusion. Small left pleural effusion again noted. Bibasilar atelectasis/infiltrates again noted. Prior CABG. Right IJ line stable position. IMPRESSION: Post thoracentesis chest x-ray reveals no evidence of pneumothorax. Significant reduction in right-sided pleural effusion. Electronically Signed   By: Marcello Moores  Register  On: 03/29/2019 15:01   US THORACENTESIS ASP PLEURAL SPACE W/IMG GUIDE  Result Date: 03/29/2019 INDICATION: Pleural effusion. EXAM: ULTRASOUND GUIDED RIGHT THORACENTESIS MEDICATIONS: None. COMPLICATIONS: None immediate. PROCEDURE: An ultrasound guided thoracentesis was thoroughly discussed with the patient and questions answered. The benefits, risks, alternatives and complications were also discussed. The patient understands and wishes to proceed with the procedure. Written consent was obtained. Ultrasound was performed to localize and mark an adequate pocket of fluid in the right chest. The area was then prepped and draped in the normal sterile fashion. 1% Lidocaine was used for local anesthesia. Under ultrasound guidance a 6 French catheter was introduced. Thoracentesis was performed. The catheter was removed and a dressing applied. FINDINGS: A total of approximately 1 L of yellow fluid was removed. Samples were sent to the laboratory as requested by the clinical team. IMPRESSION: Successful ultrasound guided right thoracentesis yielding 1 L of pleural fluid. Electronically Signed   By: [place]   On: 03/29/2019 15:04         Scheduled Meds: . albuterol  5 mg Nebulization Once  . amiodarone  200 mg Oral Daily  . apixaban  5 mg Oral BID  . aspirin EC  81 mg Oral Daily  . Chlorhexidine Gluconate Cloth  6 each Topical q morning - 10a  . dronabinol  5 mg Oral BID AC  . DULoxetine  30 mg Oral Daily  . feeding supplement (NEPRO CARB STEADY)  237 mL Oral BID BM  . ferrous sulfate  325 mg Oral BID WC  . folic acid  1 mg Oral Daily  . influenza vac split quadrivalent PF  0.5 mL Intramuscular Tomorrow-1000  . insulin aspart  0-15 Units Subcutaneous TID WC  . insulin aspart  0-5 Units Subcutaneous QHS  . insulin detemir  12 Units Subcutaneous QHS  . metoprolol succinate  12.5 mg Oral Daily  . multivitamin  1 tablet Oral QHS  . nicotine  21 mg Transdermal Daily  . polyethylene glycol  17 g Oral Daily  . rosuvastatin  10 mg Oral Daily  . sodium chloride flush  3 mL Intravenous Q12H  . thiamine  100 mg Oral Daily  . torsemide  40 mg Oral Daily   Continuous Infusions: . sodium chloride 250 mL (03/27/19 0504)     LOS: 4 days    Time spent: 31 mins    Wyvonnia Dusky, MD [facility] Pager [phone]  If 7PM-7AM, please contact night-coverage [email or web address] 03/30/2019, 8:04 AM

## 2019-03-30 NOTE — Progress Notes (Signed)
Hd started  

## 2019-03-30 NOTE — Progress Notes (Signed)
This note also relates to the following rows which could not be included: Pulse Rate - Cannot attach notes to unvalidated device data Resp - Cannot attach notes to unvalidated device data BP - Cannot attach notes to unvalidated device data  Hd completed  

## 2019-03-31 LAB — BASIC METABOLIC PANEL
Anion gap: 7 (ref 5–15)
BUN: 17 mg/dL (ref 8–23)
CO2: 32 mmol/L (ref 22–32)
Calcium: 8.3 mg/dL — ABNORMAL LOW (ref 8.9–10.3)
Chloride: 97 mmol/L — ABNORMAL LOW (ref 98–111)
Creatinine, Ser: 2.12 mg/dL — ABNORMAL HIGH (ref 0.61–1.24)
GFR calc Af Amer: 37 mL/min — ABNORMAL LOW (ref >60)
GFR calc non Af Amer: 32 mL/min — ABNORMAL LOW (ref >60)
Glucose, Bld: 182 mg/dL — ABNORMAL HIGH (ref 70–99)
Potassium: 3.8 mmol/L (ref 3.5–5.1)
Sodium: 136 mmol/L (ref 135–145)

## 2019-03-31 LAB — CBC
HCT: 26.8 % — ABNORMAL LOW (ref 39.0–52.0)
Hemoglobin: 8.7 g/dL — ABNORMAL LOW (ref 13.0–17.0)
MCH: 29 pg (ref 26.0–34.0)
MCHC: 32.5 g/dL (ref 30.0–36.0)
MCV: 89.3 fL (ref 80.0–100.0)
Platelets: 276 10*3/uL (ref 150–400)
RBC: 3 MIL/uL — ABNORMAL LOW (ref 4.22–5.81)
RDW: 17.7 % — ABNORMAL HIGH (ref 11.5–15.5)
WBC: 2.7 10*3/uL — ABNORMAL LOW (ref 4.0–10.5)
nRBC: 0 % (ref 0.0–0.2)

## 2019-03-31 LAB — GLUCOSE, CAPILLARY
Glucose-Capillary: 166 mg/dL — ABNORMAL HIGH (ref 70–99)
Glucose-Capillary: 170 mg/dL — ABNORMAL HIGH (ref 70–99)
Glucose-Capillary: 323 mg/dL — ABNORMAL HIGH (ref 70–99)
Glucose-Capillary: 218 mg/dL — ABNORMAL HIGH (ref 70–99)

## 2019-03-31 LAB — PROTEIN, BODY FLUID (OTHER): Total Protein, Body Fluid Other: 1.5 g/dL

## 2019-03-31 NOTE — Progress Notes (Signed)
PROGRESS NOTE    Craig Koch  IWP:[id number] DOB: [birth year] DOA: 03/26/2019 PCP: Tonia Ghent, MD      Assessment & Plan:   Active Problems:   Diabetes mellitus with complication (HCC)   Essential hypertension   CAD (coronary artery disease), native coronary artery   Stage 4 lung cancer (HCC)   Acute on chronic combined systolic and diastolic CHF (congestive heart failure) ([place])   HCAP (healthcare-associated pneumonia)   Hypoxemia   ESRD on hemodialysis (HCC)   Hx of CABG   AF (paroxysmal atrial fibrillation) (HCC)   Acute on chronic combined systolic (congestive) and diastolic (congestive) heart failure (HCC)   Acute hypoxia respiratory failure: etiology likely multifactorial related to heart failure exacerbation, underlying stage IV lung cancer, & left pleural effusion. Weaned off of supplemental oxygen. Resolved  Acute on chronic combined CHF: continue on lasix, metoprolol, statin. Monitor I/Os  Acute renal failure: on HD TTS. Still makes urine. Management per nephro  Hypokalemia: managed w/ HD, as per nephro.  B/l pleural effusion: likely secondary to stage IV lung cancer & will likely reoccur. Discussed this with the pt, & he verbalized his understanding. Will continue on IV lasix. Neg approx 1 L. S/p right thoracentesis on 03/29/19. Improved respiratory status, no longer requiring O2  Unlikely HCAP: recently treated for pneumonia during his hospitalization. Low suspicion for repeat pneumonia given afebrile and normal white cell count. IV Rocephin, azithromycin and vancomycin x1. Procalcitonin <0.10. Abxs were d/c  CAD: hx of CABG, NSTEMI 03/04/2019. Patient denies chest pain and EKG shows no acute ST-T wave changes. Continue on aspirin, metoprolol, statin  PAF: continue on amiodarone, metoprolol and eliquis.  DM2: uncontrolled, HbA1c 11.3. Continue on SSI w/ accuchecks  Essential hypertension: continue on metoprolol, amiodarone   Stage 4 lung cancer:   continue outpatient follow-up with oncology  Generalized weakness: PT/OT recs home health. Home health orders placed    DVT prophylaxis: eliquis Code Status: DNR Family Communication:  Disposition Plan:  Will likely d/c back home w/ home health in 24 hours if pt is medically stable   Consultants:   nephro    Procedures:    Antimicrobials:   Subjective: Pt c/o malaise but improved from day prior.   Objective: Vitals:   03/30/19 2331 03/31/19 0408 03/31/19 0500 03/31/19 0729  BP: (!) 103/56 111/62  108/67  Pulse: 80 86  82  Resp:    16  Temp:  98.5 F (36.9 C)  97.6 F (36.4 C)  TempSrc:  Oral    SpO2: 97% 94%  94%  Weight:   82.3 kg   Height:        Intake/Output Summary (Last 24 hours) at 03/31/2019 0829 Last data filed at 03/31/2019 0729 Gross per 24 hour  Intake 590 ml  Output 1600 ml  Net -1010 ml   Filed Weights   03/29/19 0352 03/30/19 0400 03/31/19 0500  Weight: 84.9 kg 83.2 kg 82.3 kg    Examination:  General exam: Appears calm and comfortable  Respiratory system: diminished breath sounds b/l.  Cardiovascular system: S1 & S2 +. No  rubs, gallops or clicks. B/l LE edema Gastrointestinal system: Abdomen is nondistended, soft and nontender.  Hypoactive bowel sounds heard. Central nervous system: Alert and oriented. Moves all 4 extremities  Psychiatry: Judgement and insight appear normal. Flat mood and affect.     Data Reviewed: I have personally reviewed following labs and imaging studies  CBC: Recent Labs  Lab 03/26/19 1702 03/26/19 1702 03/27/19  0532 03/28/19 0605 03/29/19 0504 03/30/19 0512 03/31/19 0634  WBC 4.0   < > 4.4 4.1 3.9* 3.7* 2.7*  NEUTROABS 2.5  --   --   --   --   --   --   HGB 10.4*   < > 9.2* 10.0* 8.9* 9.2* 8.7*  HCT 32.8*   < > 30.2* 31.3* 28.0* 27.9* 26.8*  MCV 89.9   < > 93.2 90.5 89.5 87.5 89.3  PLT 211   < > 248 298 277 297 276   < > = values in this interval not displayed.   Basic Metabolic Panel: Recent Labs   Lab 03/27/19 0532 03/28/19 7026 03/28/19 2329 03/29/19 0504 03/30/19 0512 03/31/19 0634  NA 139 137  --  138 136 136  K 4.0 3.6  --  3.3* 3.1* 3.8  CL 100 98  --  98 94* 97*  CO2 24 27  --  29 31 32  GLUCOSE 246* 282*  --  235* 194* 182*  BUN 18 25*  --  18 28* 17  CREATININE 2.13* 2.94*  --  2.08* 2.52* 2.12*  CALCIUM 8.4* 8.8*  --  8.4* 8.5* 8.3*  PHOS  --   --  2.8  --   --   --    GFR: Estimated Creatinine Clearance: 38.6 mL/min (A) (by C-G formula based on SCr of 2.12 mg/dL (H)). Liver Function Tests: Recent Labs  Lab 03/26/19 1702  AST 26  ALT 15  ALKPHOS 114  BILITOT 1.0  PROT 7.2  ALBUMIN 2.8*   No results for input(s): LIPASE, AMYLASE in the last 168 hours. No results for input(s): AMMONIA in the last 168 hours. Coagulation Profile: No results for input(s): INR, PROTIME in the last 168 hours. Cardiac Enzymes: No results for input(s): CKTOTAL, CKMB, CKMBINDEX, TROPONINI in the last 168 hours. BNP (last 3 results) No results for input(s): PROBNP in the last 8760 hours. HbA1C: No results for input(s): HGBA1C in the last 72 hours. CBG: Recent Labs  Lab 03/30/19 0752 03/30/19 1448 03/30/19 1645 03/30/19 2031 03/31/19 0728  GLUCAP 168* 142* 241* 215* 166*   Lipid Profile: No results for input(s): CHOL, HDL, LDLCALC, TRIG, CHOLHDL, LDLDIRECT in the last 72 hours. Thyroid Function Tests: No results for input(s): TSH, T4TOTAL, FREET4, T3FREE, THYROIDAB in the last 72 hours. Anemia Panel: No results for input(s): VITAMINB12, FOLATE, FERRITIN, TIBC, IRON, RETICCTPCT in the last 72 hours. Sepsis Labs: Recent Labs  Lab 03/26/19 1939  PROCALCITON <0.10    Recent Results (from the past 240 hour(s))  Respiratory Panel by RT PCR (Flu A&B, Covid) - Nasopharyngeal Swab     Status: None   Collection Time: 03/26/19  6:40 PM   Specimen: Nasopharyngeal Swab  Result Value Ref Range Status   SARS Coronavirus 2 by RT PCR NEGATIVE NEGATIVE Final    Comment:  (NOTE) SARS-CoV-2 target nucleic acids are NOT DETECTED. The SARS-CoV-2 RNA is generally detectable in upper respiratoy specimens during the acute phase of infection. The lowest concentration of SARS-CoV-2 viral copies this assay can detect is 131 copies/mL. A negative result does not preclude SARS-Cov-2 infection and should not be used as the sole basis for treatment or other patient management decisions. A negative result may occur with  improper specimen collection/handling, submission of specimen other than nasopharyngeal swab, presence of viral mutation(s) within the areas targeted by this assay, and inadequate number of viral copies (<131 copies/mL). A negative result must be combined with clinical observations, patient  history, and epidemiological information. The expected result is Negative. Fact Sheet for Patients:  PinkCheek.be Fact Sheet for Healthcare Providers:  GravelBags.it This test is not yet ap proved or cleared by the [place] FDA and  has been authorized for detection and/or diagnosis of SARS-CoV-2 by FDA under an Emergency Use Authorization (EUA). This EUA will remain  in effect (meaning this test can be used) for the duration of the COVID-19 declaration under Section 564(b)(1) of the Act, 21 U.S.C. section 360bbb-3(b)(1), unless the authorization is terminated or revoked sooner.    Influenza A by PCR NEGATIVE NEGATIVE Final   Influenza B by PCR NEGATIVE NEGATIVE Final    Comment: (NOTE) The Xpert Xpress SARS-CoV-2/FLU/RSV assay is intended as an aid in  the diagnosis of influenza from Nasopharyngeal swab specimens and  should not be used as a sole basis for treatment. Nasal washings and  aspirates are unacceptable for Xpert Xpress SARS-CoV-2/FLU/RSV  testing. Fact Sheet for Patients: PinkCheek.be Fact Sheet for Healthcare  Providers: GravelBags.it This test is not yet approved or cleared by the [place] FDA and  has been authorized for detection and/or diagnosis of SARS-CoV-2 by  FDA under an Emergency Use Authorization (EUA). This EUA will remain  in effect (meaning this test can be used) for the duration of the  Covid-19 declaration under Section 564(b)(1) of the Act, 21  U.S.C. section 360bbb-3(b)(1), unless the authorization is  terminated or revoked. Performed at [facility], [address] [place], [place] [address]   MRSA PCR Screening     Status: None   Collection Time: 03/26/19  8:03 PM   Specimen: Nasal Mucosa; Nasopharyngeal  Result Value Ref Range Status   MRSA by PCR NEGATIVE NEGATIVE Final    Comment:        The GeneXpert MRSA Assay (FDA approved for NASAL specimens only), is one component of a comprehensive MRSA colonization surveillance program. It is not intended to diagnose MRSA infection nor to guide or monitor treatment for MRSA infections. Performed at [facility], [address] [place], [place] [address]   Body fluid culture     Status: None (Preliminary result)   Collection Time: 03/29/19  2:30 PM   Specimen: PATH Cytology Pleural fluid  Result Value Ref Range Status   Specimen Description   Final    PLEURAL Performed at [facility], [address] [place], [place] [address]    Special Requests   Final    NONE Performed at [facility], [address] [place], [place] [address]    Gram Stain   Final    FEW WBC PRESENT, PREDOMINANTLY MONONUCLEAR NO ORGANISMS SEEN    Culture   Final    NO GROWTH < 24 HOURS Performed at [facility], [place] [address], [place], [place] [address]    Report Status PENDING  Incomplete         Radiology Studies: DG Chest Port 1 View  Result Date: 03/29/2019 CLINICAL DATA:  Post thoracentesis. EXAM: PORTABLE CHEST 1 VIEW COMPARISON:  03/28/2019.  FINDINGS: Post thoracentesis chest x-ray reveals no evidence of pneumothorax. Significant reduction in right-sided pleural effusion. Small left pleural effusion again noted. Bibasilar atelectasis/infiltrates again noted. Prior CABG. Right IJ line stable position. IMPRESSION: Post thoracentesis chest x-ray reveals no evidence of pneumothorax. Significant reduction in right-sided pleural effusion. Electronically Signed   By: [place]   On: 03/29/2019 15:01   US THORACENTESIS ASP PLEURAL SPACE W/IMG GUIDE  Result  Date: 03/29/2019 INDICATION: Pleural effusion. EXAM: ULTRASOUND GUIDED RIGHT THORACENTESIS MEDICATIONS: None. COMPLICATIONS: None immediate. PROCEDURE: An ultrasound guided thoracentesis was thoroughly discussed with the patient and questions answered. The benefits, risks, alternatives and complications were also discussed. The patient understands and wishes to proceed with the procedure. Written consent was obtained. Ultrasound was performed to localize and mark an adequate pocket of fluid in the right chest. The area was then prepped and draped in the normal sterile fashion. 1% Lidocaine was used for local anesthesia. Under ultrasound guidance a 6 French catheter was introduced. Thoracentesis was performed. The catheter was removed and a dressing applied. FINDINGS: A total of approximately 1 L of yellow fluid was removed. Samples were sent to the laboratory as requested by the clinical team. IMPRESSION: Successful ultrasound guided right thoracentesis yielding 1 L of pleural fluid. Electronically Signed   By: [place]   On: 03/29/2019 15:04        Scheduled Meds: . albuterol  5 mg Nebulization Once  . amiodarone  200 mg Oral Daily  . apixaban  5 mg Oral BID  . aspirin EC  81 mg Oral Daily  . Chlorhexidine Gluconate Cloth  6 each Topical q morning - 10a  . dronabinol  5 mg Oral BID AC  . DULoxetine  30 mg Oral Daily  . feeding supplement (NEPRO CARB STEADY)  237 mL Oral BID  BM  . ferrous sulfate  325 mg Oral BID WC  . folic acid  1 mg Oral Daily  . influenza vac split quadrivalent PF  0.5 mL Intramuscular Tomorrow-1000  . insulin aspart  0-15 Units Subcutaneous TID WC  . insulin aspart  0-5 Units Subcutaneous QHS  . insulin detemir  12 Units Subcutaneous QHS  . metoprolol succinate  12.5 mg Oral Daily  . multivitamin  1 tablet Oral QHS  . nicotine  21 mg Transdermal Daily  . polyethylene glycol  17 g Oral Daily  . rosuvastatin  10 mg Oral Daily  . sodium chloride flush  3 mL Intravenous Q12H  . thiamine  100 mg Oral Daily  . torsemide  40 mg Oral Daily   Continuous Infusions: . sodium chloride 250 mL (03/27/19 0504)     LOS: 5 days    Time spent: 30 mins    Wyvonnia Dusky, MD [facility] Pager [phone]  If 7PM-7AM, please contact night-coverage [email or web address] 03/31/2019, 8:29 AM

## 2019-03-31 NOTE — Progress Notes (Signed)
I have reviewed the charting done by the Student-RN and I agree with his assessments.   Earleen Reaper, RN

## 2019-03-31 NOTE — Progress Notes (Deleted)
Cardiology Office Note  Date:  03/31/2019   ID:  Craig Koch, DOB 1955/11/19, MRN [id number]  PCP:  Tonia Ghent, MD   No chief complaint on file.   HPI:  [name] is a 64 year old male with past medical history of CAD,  CABG with LIMA to the  LAD, SVG to OM2/OM3, SVG to diag Diabetes, poorly controlled , 9.0 Smoker HTN Depression hyperlipidemia Presents for f/u of his CAD, hx of CABG  Echo 03/15/2019: EF 35 to 40%    New to our office, he reports that he has no chest pain concerning for angina Long discussion concerning his prior cardiac history  Stress test 03/2008 EF 45% Fixed defect involving the inferior wall most consistent with scar versus hibernating myocardium.  Stress test March 2012 ? 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  PSH:    Past Surgical History:  Procedure Laterality Date  . CATARACT EXTRACTION Left 09/2015  . CHEST TUBE INSERTION Left 10/01/2018   Procedure: INSERTION PLEURAL DRAINAGE CATHETER;  Surgeon: [name], MD;  Location: [facility] ORS;  Service: Thoracic;  Laterality: Left;  . CORONARY ARTERY BYPASS  GRAFT  2004   (CABG with LIMA to the  LAD, SVG to OM2/OM3, SVG  to diag  . DIALYSIS/PERMA CATHETER INSERTION N/A 03/14/2019   Procedure: DIALYSIS/PERMA CATHETER INSERTION;  Surgeon: Algernon Huxley, MD;  Location: [place] [facility];  Service: Cardiovascular;  Laterality: N/A;  . Left ankle surgery     repair of fracture  . Right lower leg surgery     rod  . TEMPORARY DIALYSIS CATHETER N/A 03/11/2019   Procedure: TEMPORARY DIALYSIS CATHETER;  Surgeon: Algernon Huxley, MD;  Location: [place] [facility];  Service: Cardiovascular;  Laterality: N/A;    No current facility-administered medications for this visit.   No current outpatient medications on file.   Facility-Administered Medications Ordered in Other Visits  Medication Dose Route Frequency Provider Last Rate Last Admin  . 0.9 %  sodium chloride infusion  250 mL Intravenous PRN Athena Masse, MD 10 mL/hr at 03/27/19 0504 250 mL at 03/27/19 0504  . acetaminophen (TYLENOL) tablet 650 mg  650 mg Oral Q4H PRN Judd Gaudier V, MD      . albuterol (PROVENTIL) (2.5 MG/3ML) 0.083% nebulizer solution 5 mg  5 mg Nebulization Once Lilia Pro., MD      . amiodarone (PACERONE) tablet 200 mg  200 mg Oral Daily Athena Masse, MD   200 mg at 03/31/19 7124  . apixaban (ELIQUIS) tablet 5 mg  5 mg Oral BID Athena Masse, MD   5 mg at 03/31/19 0926  . aspirin EC tablet 81 mg  81  mg Oral Daily Athena Masse, MD   81 mg at 03/31/19 7510  . Chlorhexidine Gluconate Cloth 2 % PADS 6 each  6 each Topical q morning - 10a Athena Masse, MD   6 each at 03/31/19 [phone]  . dronabinol (MARINOL) capsule 5 mg  5 mg Oral BID AC Athena Masse, MD   5 mg at 03/31/19 1746  . DULoxetine (CYMBALTA) DR capsule 30 mg  30 mg Oral Daily Athena Masse, MD   30 mg at 03/31/19 0925  . feeding supplement (NEPRO CARB STEADY) liquid 237 mL  237 mL Oral BID BM Wyvonnia Dusky, MD   237 mL at 03/30/19 1426  . ferrous sulfate tablet 325 mg  325 mg Oral BID WC Athena Masse, MD   325 mg at 03/31/19 1746  . folic acid (FOLVITE) tablet 1 mg  1 mg Oral Daily Athena Masse, MD   1 mg at 03/31/19 2778  . influenza vac split quadrivalent PF (FLUARIX) injection 0.5 mL  0.5 mL Intramuscular Tomorrow-1000 Judd Gaudier V, MD      . insulin aspart (novoLOG) injection 0-15 Units  0-15 Units Subcutaneous TID WC Athena Masse, MD   11 Units at 03/31/19 1746  . insulin aspart (novoLOG) injection 0-5 Units  0-5 Units Subcutaneous QHS Athena Masse, MD   2 Units at 03/27/19 2112  . insulin detemir (LEVEMIR) injection 12 Units  12 Units Subcutaneous QHS Wyvonnia Dusky, MD   12 Units at 03/30/19 2058  . metoprolol succinate (TOPROL-XL) 24 hr tablet 12.5 mg  12.5 mg Oral Daily Judd Gaudier V, MD   12.5 mg at 03/31/19 0924  . multivitamin (RENA-VIT) tablet 1 tablet  1 tablet Oral QHS Athena Masse, MD   1 tablet at 03/30/19 2001  . nicotine (NICODERM CQ - dosed in mg/24 hours) patch 21 mg  21 mg Transdermal Daily Judd Gaudier V, MD      . ondansetron [facility]) injection 4 mg  4 mg Intravenous Q6H PRN Athena Masse, MD   4 mg at 03/30/19 1715  . ondansetron (ZOFRAN) tablet 8 mg  8 mg Oral Q8H PRN Athena Masse, MD      . polyethylene glycol (MIRALAX / GLYCOLAX) packet 17 g  17 g Oral Daily Athena Masse, MD   17 g at 03/31/19 [phone]  . rosuvastatin (CRESTOR) tablet 10 mg  10 mg Oral Daily Oswald Hillock, RPH   10 mg at 03/31/19 5361  . sodium chloride (OCEAN) 0.65 % nasal spray 1 spray  1 spray Each Nare PRN Sharion Settler, NP      . sodium chloride flush (NS) 0.9 % injection 3 mL  3 mL Intravenous Q12H Athena Masse, MD   3 mL at 03/31/19 0926  . sodium chloride flush (NS) 0.9 % injection 3 mL  3 mL Intravenous PRN Athena Masse, MD      . thiamine tablet 100 mg  100 mg Oral Daily Athena Masse, MD   100 mg at 03/31/19 0924  . torsemide (DEMADEX) tablet 40 mg  40 mg Oral Daily Murlean Iba, MD   40 mg at 03/31/19 4431    Allergies:   Lipitor [atorvastatin  calcium]   Social History:  The patient  reports that he has been smoking cigarettes. He has a 9.00 pack-year smoking history. He has quit using smokeless tobacco.  His smokeless tobacco use included snuff. He reports current  alcohol use of about 6.0 standard drinks of alcohol per week. He reports that he does not use drugs.   Family History:   family history includes Dementia in his mother; Heart disease in his father.    Review of Systems: Review of Systems  Constitutional: Negative.   Respiratory: Negative.   Cardiovascular: Negative.   Gastrointestinal: Negative.   Musculoskeletal: Negative.   Neurological: Negative.   Psychiatric/Behavioral: Negative.   All other systems reviewed and are negative.   PHYSICAL EXAM: VS:  There were no vitals taken for this visit. , BMI There is no height or weight on file to calculate BMI. GEN: Well nourished, well developed, in no acute distress  HEENT: normal  Neck: no JVD, carotid bruits, or masses Cardiac: RRR; no murmurs, rubs, or gallops,no edema  Respiratory:  clear to auscultation bilaterally, normal work of breathing GI: soft, nontender, nondistended, + BS MS: no deformity or atrophy  Skin: warm and dry, no rash Neuro:  Strength and sensation are intact Psych: euthymic mood, full affect   Recent Labs: 03/04/2019: TSH 3.184 03/06/2019: Magnesium 1.9 03/26/2019: ALT 15; B Natriuretic Peptide 475.0 03/31/2019: BUN 17; Creatinine, Ser 2.12; Hemoglobin 8.7; Platelets 276; Potassium 3.8; Sodium 136    Lipid Panel Lab Results  Component Value Date   CHOL 138 03/04/2019   HDL 15 (L) 03/04/2019   LDLCALC 81 03/04/2019   TRIG 209 (H) 03/04/2019      Wt Readings from Last 3 Encounters:  03/31/19 181 lb 8 oz (82.3 kg)  03/19/19 190 lb (86.2 kg)  02/14/19 191 lb (86.6 kg)      ASSESSMENT AND PLAN:  Coronary artery disease of native artery of native heart with stable angina pectoris ([place]) - Plan: EKG 12-Lead Currently with no  symptoms of angina. No further workup at this time. Continue current medication regimen. Long discussion concerning anginal symptoms to watch for No testing has been ordered at this time Stressed the importance of diabetes control  Diabetes mellitus with complication ([place]) - Plan: EKG 12-Lead Hemoglobin A1c discussed with him, 9 Recommended a low carbohydrate diet.Discussed with him in detail Dietary guide provided  Mixed hyperlipidemia Ideally goal LDL should be 60 her last He does not want to increase the Crestor at this time Prefers to do it through low carbohydrate diet and weight loss  Smoker We have encouraged him to continue to work on weaning his cigarettes and smoking cessation. He will continue to work on this and does not want any assistance with chantix. We did provide him with coupon for Chantix if he changes his mind  Essential hypertension Blood pressure is well controlled on today's visit. No changes made to the medications.  Disposition:   F/U  12 months   extensive discussion with patient and review of records  Total encounter time more than 60 minutes  Greater than 50% was spent in counseling and coordination of care with the patient    No orders of the defined types were placed in this encounter.    Signed, Esmond Plants, M.D., Ph.D. 03/31/2019  [place], [place]

## 2019-03-31 NOTE — Progress Notes (Signed)
Craig Koch  MRN: [id number]  DOB/AGE: 1955/07/23 64 y.o.  Primary Care Physician:Duncan, Elveria Rising, MD  Admit date: 03/26/2019  Chief Complaint:  Chief Complaint  Patient presents with  . Shortness of Breath    S-Pt presented on  03/26/2019 with  Chief Complaint  Patient presents with  . Shortness of Breath  . Patient offers no new complaints Patient's main concern he believes he can go home.  I then discussed patient labs e including patient blood pressure being on the lower side. I discussed with patient that hisblood pressure is on lower side , it is better if he stays for another day .  Patient and his spouse voiced understanding   Medications . albuterol  5 mg Nebulization Once  . amiodarone  200 mg Oral Daily  . apixaban  5 mg Oral BID  . aspirin EC  81 mg Oral Daily  . Chlorhexidine Gluconate Cloth  6 each Topical q morning - 10a  . dronabinol  5 mg Oral BID AC  . DULoxetine  30 mg Oral Daily  . feeding supplement (NEPRO CARB STEADY)  237 mL Oral BID BM  . ferrous sulfate  325 mg Oral BID WC  . folic acid  1 mg Oral Daily  . influenza vac split quadrivalent PF  0.5 mL Intramuscular Tomorrow-1000  . insulin aspart  0-15 Units Subcutaneous TID WC  . insulin aspart  0-5 Units Subcutaneous QHS  . insulin detemir  12 Units Subcutaneous QHS  . metoprolol succinate  12.5 mg Oral Daily  . multivitamin  1 tablet Oral QHS  . nicotine  21 mg Transdermal Daily  . polyethylene glycol  17 g Oral Daily  . rosuvastatin  10 mg Oral Daily  . sodium chloride flush  3 mL Intravenous Q12H  . thiamine  100 mg Oral Daily  . torsemide  40 mg Oral Daily         OXB:DZHGD from the symptoms mentioned above,there are no other symptoms referable to all systems reviewed.  Physical Exam: Vital signs in last 24 hours: Temp:  [97.6 F (36.4 C)-99.8 F (37.7 C)] 97.6 F (36.4 C) (03/28 0729) Pulse Rate:  [78-98] 82 (03/28 0729) Resp:  [14-23] 16 (03/28 0729) BP: (77-135)/(45-84)  108/67 (03/28 0729) SpO2:  [92 %-99 %] 94 % (03/28 0729) Weight:  [82.3 kg] 82.3 kg (03/28 0500) Weight change: -0.862 kg Last BM Date: 03/27/19  Intake/Output from previous day: 03/27 0701 - 03/28 0700 In: 590 [P.O.:240; IV Piggyback:350] Out: 1400 [Urine:400] Total I/O In: 3 [I.V.:3] Out: 200 [Urine:200]   Physical Exam: General- pt is awake,alert, oriented to time place and person Resp- No acute REsp distress, CTA B/L NO Rhonchi CVS- S1S2 regular in rate and rhythm GIT- BS+, soft, NT, ND EXT- NO LE Edema, Cyanosis Access tunneled catheter in situ  Lab Results: CBC Recent Labs    03/30/19 0512 03/31/19 0634  WBC 3.7* 2.7*  HGB 9.2* 8.7*  HCT 27.9* 26.8*  PLT 297 276    BMET Recent Labs    03/30/19 0512 03/31/19 0634  NA 136 136  K 3.1* 3.8  CL 94* 97*  CO2 31 32  GLUCOSE 194* 182*  BUN 28* 17  CREATININE 2.52* 2.12*  CALCIUM 8.5* 8.3*    MICRO Recent Results (from the past 240 hour(s))  Respiratory Panel by RT PCR (Flu A&B, Covid) - Nasopharyngeal Swab     Status: None   Collection Time: 03/26/19  6:40 PM   Specimen:  Nasopharyngeal Swab  Result Value Ref Range Status   SARS Coronavirus 2 by RT PCR NEGATIVE NEGATIVE Final    Comment: (NOTE) SARS-CoV-2 target nucleic acids are NOT DETECTED. The SARS-CoV-2 RNA is generally detectable in upper respiratoy specimens during the acute phase of infection. The lowest concentration of SARS-CoV-2 viral copies this assay can detect is 131 copies/mL. A negative result does not preclude SARS-Cov-2 infection and should not be used as the sole basis for treatment or other patient management decisions. A negative result may occur with  improper specimen collection/handling, submission of specimen other than nasopharyngeal swab, presence of viral mutation(s) within the areas targeted by this assay, and inadequate number of viral copies (<131 copies/mL). A negative result must be combined with clinical observations,  patient history, and epidemiological information. The expected result is Negative. Fact Sheet for Patients:  PinkCheek.be Fact Sheet for Healthcare Providers:  GravelBags.it This test is not yet ap proved or cleared by the [place] FDA and  has been authorized for detection and/or diagnosis of SARS-CoV-2 by FDA under an Emergency Use Authorization (EUA). This EUA will remain  in effect (meaning this test can be used) for the duration of the COVID-19 declaration under Section 564(b)(1) of the Act, 21 U.S.C. section 360bbb-3(b)(1), unless the authorization is terminated or revoked sooner.    Influenza A by PCR NEGATIVE NEGATIVE Final   Influenza B by PCR NEGATIVE NEGATIVE Final    Comment: (NOTE) The Xpert Xpress SARS-CoV-2/FLU/RSV assay is intended as an aid in  the diagnosis of influenza from Nasopharyngeal swab specimens and  should not be used as a sole basis for treatment. Nasal washings and  aspirates are unacceptable for Xpert Xpress SARS-CoV-2/FLU/RSV  testing. Fact Sheet for Patients: PinkCheek.be Fact Sheet for Healthcare Providers: GravelBags.it This test is not yet approved or cleared by the [place] FDA and  has been authorized for detection and/or diagnosis of SARS-CoV-2 by  FDA under an Emergency Use Authorization (EUA). This EUA will remain  in effect (meaning this test can be used) for the duration of the  Covid-19 declaration under Section 564(b)(1) of the Act, 21  U.S.C. section 360bbb-3(b)(1), unless the authorization is  terminated or revoked. Performed at [facility], [address] [place], [place] [address]   MRSA PCR Screening     Status: None   Collection Time: 03/26/19  8:03 PM   Specimen: Nasal Mucosa; Nasopharyngeal  Result Value Ref Range Status   MRSA by PCR NEGATIVE NEGATIVE Final    Comment:        The  GeneXpert MRSA Assay (FDA approved for NASAL specimens only), is one component of a comprehensive MRSA colonization surveillance program. It is not intended to diagnose MRSA infection nor to guide or monitor treatment for MRSA infections. Performed at [facility], [address] [place], [place] [address]   Body fluid culture     Status: None (Preliminary result)   Collection Time: 03/29/19  2:30 PM   Specimen: PATH Cytology Pleural fluid  Result Value Ref Range Status   Specimen Description   Final    PLEURAL Performed at [name] [address] [place], [place] [address]    Special Requests   Final    NONE Performed at [facility], [address] [place], [place] [address]    Gram Stain   Final    FEW WBC PRESENT, PREDOMINANTLY MONONUCLEAR NO ORGANISMS SEEN    Culture   Final  NO GROWTH 2 DAYS Performed at [facility], [place] [address] [place], [place] [address]    Report Status PENDING  Incomplete      Lab Results  Component Value Date   PTH 65 03/10/2019   CALCIUM 8.3 (L) 03/31/2019   PHOS 2.8 03/28/2019               Impression: 1)Renal  AKI secondary to ATN Patient had ATN during March admission. Patient had severe ATN requiring renal replacement therapy. Patient has been on dialysis since March 11, 2019. Was currently being dialyzed as an outpatient at [facility] at  [place] Patient is on Monday Wednesday Friday schedule as an outpatient As inpatient patient was dialyzed on March 25 and yesterday( 3.27.21)    2) hypotension Patient is clinically stable  3)Anemia of chronic disease  HGb at goal (9--11)   4) secondary hyperparathyroidism -CKD Mineral-Bone Disorder  Patient calcium and phosphorus are at goal. Patient had hyperphosphatemia earlier  5) shortness of breath. Patient is now s/p thoracentesis. Patient is now clinically better  6) electrolytes    sodium Normonatremic   potassium Hypokalemia We are using 4K bath    7)Acid base Co2 at goal     Plan:  We will continue current treatment plan      Mannix Kroeker s [facility] 03/31/2019, 10:03 AM

## 2019-04-01 ENCOUNTER — Ambulatory Visit: Payer: Medicare Other | Admitting: Cardiovascular Disease

## 2019-04-01 ENCOUNTER — Telehealth: Payer: Self-pay | Admitting: Cardiovascular Disease

## 2019-04-01 DIAGNOSIS — C3490 Malignant neoplasm of unspecified part of unspecified bronchus or lung: Principal | ICD-10-CM

## 2019-04-01 LAB — BASIC METABOLIC PANEL
Anion gap: 10 (ref 5–15)
BUN: 30 mg/dL — ABNORMAL HIGH (ref 8–23)
CO2: 30 mmol/L (ref 22–32)
Calcium: 8.4 mg/dL — ABNORMAL LOW (ref 8.9–10.3)
Chloride: 94 mmol/L — ABNORMAL LOW (ref 98–111)
Creatinine, Ser: 2.8 mg/dL — ABNORMAL HIGH (ref 0.61–1.24)
GFR calc Af Amer: 26 mL/min — ABNORMAL LOW (ref >60)
GFR calc non Af Amer: 23 mL/min — ABNORMAL LOW (ref >60)
Glucose, Bld: 117 mg/dL — ABNORMAL HIGH (ref 70–99)
Potassium: 3.5 mmol/L (ref 3.5–5.1)
Sodium: 134 mmol/L — ABNORMAL LOW (ref 135–145)

## 2019-04-01 LAB — BODY FLUID CULTURE: Culture: NO GROWTH

## 2019-04-01 LAB — CBC
HCT: 26.4 % — ABNORMAL LOW (ref 39.0–52.0)
Hemoglobin: 8.7 g/dL — ABNORMAL LOW (ref 13.0–17.0)
MCH: 29.2 pg (ref 26.0–34.0)
MCHC: 33 g/dL (ref 30.0–36.0)
MCV: 88.6 fL (ref 80.0–100.0)
Platelets: 258 10*3/uL (ref 150–400)
RBC: 2.98 MIL/uL — ABNORMAL LOW (ref 4.22–5.81)
RDW: 17.8 % — ABNORMAL HIGH (ref 11.5–15.5)
WBC: 2.5 10*3/uL — ABNORMAL LOW (ref 4.0–10.5)
nRBC: 0 % (ref 0.0–0.2)

## 2019-04-01 LAB — GLUCOSE, CAPILLARY
Glucose-Capillary: 107 mg/dL — ABNORMAL HIGH (ref 70–99)
Glucose-Capillary: 180 mg/dL — ABNORMAL HIGH (ref 70–99)

## 2019-04-01 MED ORDER — ALBUTEROL SULFATE HFA 108 (90 BASE) MCG/ACT IN AERS: 2.0000 | INHALATION_SPRAY | Freq: Four times a day (QID) | RESPIRATORY_TRACT | 0 refills | Status: DC | PRN

## 2019-04-01 MED ORDER — DRONABINOL 5 MG PO CAPS: 5.0000 mg | ORAL_CAPSULE | Freq: Two times a day (BID) | ORAL | 0 refills | Status: DC

## 2019-04-01 MED ORDER — AMIODARONE HCL 200 MG PO TABS: 200.0000 mg | ORAL_TABLET | Freq: Every day | ORAL | 0 refills | Status: DC

## 2019-04-01 MED ORDER — METOPROLOL SUCCINATE ER 25 MG PO TB24: 12.5000 mg | ORAL_TABLET | Freq: Every day | ORAL | 0 refills | Status: DC

## 2019-04-01 MED ORDER — DULOXETINE HCL 30 MG PO CPEP: 30.0000 mg | ORAL_CAPSULE | Freq: Every day | ORAL | 0 refills | Status: DC

## 2019-04-01 MED ORDER — CLOPIDOGREL BISULFATE 75 MG PO TABS: 75.0000 mg | ORAL_TABLET | Freq: Every day | ORAL | 0 refills | Status: DC

## 2019-04-01 MED ORDER — GUAIFENESIN ER 600 MG PO TB12
1200.0000 mg | ORAL_TABLET | Freq: Two times a day (BID) | ORAL | Status: DC
  Administered 2019-04-01: 1200 mg via ORAL
  Filled 2019-04-01: qty 2

## 2019-04-01 MED ORDER — CLOPIDOGREL BISULFATE 75 MG PO TABS: 75.0000 mg | ORAL_TABLET | Freq: Every day | ORAL | Status: DC

## 2019-04-01 MED ORDER — APIXABAN 5 MG PO TABS: 5.0000 mg | ORAL_TABLET | Freq: Two times a day (BID) | ORAL | Status: DC

## 2019-04-01 MED ORDER — FERROUS SULFATE 325 (65 FE) MG PO TABS: 325.0000 mg | ORAL_TABLET | Freq: Two times a day (BID) | ORAL | 0 refills | Status: DC

## 2019-04-01 MED ORDER — ONDANSETRON HCL 8 MG PO TABS: 8.0000 mg | ORAL_TABLET | Freq: Three times a day (TID) | ORAL | 0 refills | Status: AC | PRN

## 2019-04-01 NOTE — Progress Notes (Signed)
Inpatient Diabetes Program Recommendations  AACE/ADA: New Consensus Statement on Inpatient Glycemic Control (2015)  Target Ranges:  Prepandial:   less than 140 mg/dL      Peak postprandial:   less than 180 mg/dL (1-2 hours)      Critically ill patients:  140 - 180 mg/dL   Lab Results  Component Value Date   GLUCAP 107 (H) 04/01/2019   HGBA1C 8.8 (H) 03/26/2019    Review of Glycemic Control Results for Craig Koch, Craig Koch (MRN [id number]) as of 04/01/2019 10:52  Ref. Range 03/31/2019 07:28 03/31/2019 11:18 03/31/2019 16:51 03/31/2019 20:50 04/01/2019 07:54  Glucose-Capillary Latest Ref Range: 70 - 99 mg/dL 166 (H) 170 (H) 323 (H) 218 (H) 107 (H)  Diabetes history:DM2 Outpatient Diabetes medications:Levemir 20 units QHS, Novolog 0-9 units TID with meals Current orders for Inpatient glycemic control:Novolog 0-15 units TID with meals, Novolog 0-5 units QHS, Levemir 12 units q HS Inpatient Diabetes Program Recommendations:  Please consider adding Novolog 2 units tid with meals (meal coverage) to prevent post-prandial peaks in blood sugars.    Thanks  Adah Perl, RN, BC-ADM Inpatient Diabetes Coordinator Pager [phone] (8a-5p)

## 2019-04-01 NOTE — Telephone Encounter (Signed)
Cancelled appointment and left a vm to have patient call and reschedule

## 2019-04-01 NOTE — Telephone Encounter (Signed)
-----   Message from Minna Merritts, MD sent at 03/31/2019  7:07 PM EDT ----- Scheduled with me on Monday 3/29 Looks like still in hospital We can reschedule Thx TG

## 2019-04-01 NOTE — Progress Notes (Signed)
IV and tele removed from patient. Discharge instructions given to patient and wife. Verbalized understanding. No acute distress at this time. Wife to transport patient home.

## 2019-04-01 NOTE — Progress Notes (Signed)
Physical Therapy Treatment Patient Details Name: Craig Koch MRN: [id number] DOB: [birth year] Today's Date: 04/01/2019    History of Present Illness Pt is a 65 y.o. male presenting to hospital 03/26/19 with SOB after finishing dialysis (pt started dialysis about 3 weeks ago); noted with hypoxia and increasing LE edema.  Pt admitted with hypoxia, acute on chronic combined systolic and diastolic CHF, possible HCAP, CAD, and a-fib.  PMH includes CAD with h/o CABG, HFrEF, lung CA (stage IV), htn, DM, h/o L ankle surgery, h/o thoracentesis, afib with RVR, NSTEMI, and R IJ permcath placement 3/11.    PT Comments    Pt resting in bed upon PT arrival and pt reporting minimal OOB mobility over the weekend and also now with cough (nurse already aware).  Pt able to ambulate 50 feet x3 with RW CGA to SBA (limited distance d/t fatigue); O2 sats 92% or greater on room air 1st 2 trials of ambulation and 89% on room air 3rd trial; O2 sats 92% at rest on room air end of session sitting in recliner.  Educated pt on pacing and activity modification with household distance ambulation.  Will continue to focus on strengthening, increasing ambulation distance, and activity tolerance/endurance per pt tolerance.    Follow Up Recommendations  Home health PT;Supervision for mobility/OOB     Equipment Recommendations  Rolling walker with 5" wheels    Recommendations for Other Services OT consult     Precautions / Restrictions Precautions Precautions: Fall Precaution Comments: R IJ permcath Restrictions Weight Bearing Restrictions: No    Mobility  Bed Mobility Overal bed mobility: Modified Independent Bed Mobility: Supine to Sit     Supine to sit: Modified independent (Device/Increase time);HOB elevated     General bed mobility comments: No difficulties noted semi-supine to sitting edge of bed  Transfers Overall transfer level: Needs assistance Equipment used: Rolling walker (2 wheeled) Transfers:  Sit to/from Stand Sit to Stand: Supervision         General transfer comment: mild increased effort to stand up to RW x3 trials from bed  Ambulation/Gait Ambulation/Gait assistance: Min guard;Supervision Gait Distance (Feet): (50 feet x3) Assistive device: Rolling walker (2 wheeled)   Gait velocity: decreased   General Gait Details: 1st 2 trials of ambulation pt demonstrating step to gait pattern and progressing to partial step through gait pattern 3rd trial ambulating   Stairs             Wheelchair Mobility    Modified Rankin (Stroke Patients Only)       Balance Overall balance assessment: Needs assistance Sitting-balance support: No upper extremity supported;Feet supported Sitting balance-Leahy Scale: Normal Sitting balance - Comments: steady sitting reaching outside BOS   Standing balance support: No upper extremity supported Standing balance-Leahy Scale: Fair Standing balance comment: steady standing reaching within BOS                            Cognition Arousal/Alertness: Awake/alert Behavior During Therapy: WFL for tasks assessed/performed Overall Cognitive Status: Within Functional Limits for tasks assessed                                        Exercises      General Comments   Nursing cleared pt for participation in physical therapy.  Pt agreeable to PT session.      Pertinent Vitals/Pain Pain  Assessment: No/denies pain Pain Score: 0-No pain Pain Intervention(s): Limited activity within patient's tolerance;Monitored during session;Repositioned    Home Living                      Prior Function            PT Goals (current goals can now be found in the care plan section) Acute Rehab PT Goals Patient Stated Goal: to go home PT Goal Formulation: With patient Time For Goal Achievement: 04/12/19 Potential to Achieve Goals: Good Progress towards PT goals: Progressing toward goals    Frequency     Min 2X/week      PT Plan Current plan remains appropriate    Co-evaluation              AM-PAC PT "6 Clicks" Mobility   Outcome Measure  Help needed turning from your back to your side while in a flat bed without using bedrails?: None Help needed moving from lying on your back to sitting on the side of a flat bed without using bedrails?: None Help needed moving to and from a bed to a chair (including a wheelchair)?: A Little Help needed standing up from a chair using your arms (e.g., wheelchair or bedside chair)?: A Little Help needed to walk in hospital room?: A Little Help needed climbing 3-5 steps with a railing? : A Little 6 Click Score: 20    End of Session Equipment Utilized During Treatment: Gait belt Activity Tolerance: Patient limited by fatigue Patient left: in chair;with call bell/phone within reach;with chair alarm set Nurse Communication: Mobility status;Precautions;Other (comment)(Pt's O2 sats during session) PT Visit Diagnosis: Muscle weakness (generalized) (M62.81);Difficulty in walking, not elsewhere classified (R26.2)     Time: 8270-7867 PT Time Calculation (min) (ACUTE ONLY): 23 min  Charges:  $Therapeutic Exercise: 8-22 mins $Therapeutic Activity: 8-22 mins                    Leitha Bleak, PT 04/01/19, 1:06 PM

## 2019-04-01 NOTE — Discharge Instructions (Signed)
Eating Plan for Dialysis Dialysis is a treatment that cleans your blood. It is used when your kidneys are damaged. When you need dialysis, you should watch what you eat. This is because some nutrients can build up in your blood between treatments and make you sick. Your doctor or diet specialist (dietitian) will:  Tell you what nutrients you should include or avoid.  Tell you how much of these nutrients you should get each day.  Help you plan meals.  Tell you how much to drink each day. What are tips for following this plan? Reading food labels  Check food labels for: ? Potassium. This is found in milk, fruits, and vegetables. ? Phosphorus. This is found in milk, cheese, beans, nuts, and carbonated beverages. ? Salt (sodium). This is in processed meats, cured meats, ready-made frozen meals, canned vegetables, and salty snack foods.  Try to find foods that are low in potassium, phosphorus, and sodium.  Look for foods that are labeled "sodium free," "reduced sodium," or "low sodium." Shopping  Do not buy whole-grain and high-fiber foods.  Do not buy or use salt substitutes.  Do not buy processed foods. Cooking  Drain all fluid from cooked vegetables and canned fruits before you eat them.  Before you cook potatoes, cut them into small pieces. Then boil them in unsalted water.  Try using herbs and spices that do not contain sodium to add flavor. Meal planning Most people on dialysis should try to eat:  6-11 servings of grains each day. One serving is equal to 1 slice of bread or  cup of cooked rice or pasta.  2-3 servings of low-potassium vegetables each day. One serving is equal to  cup.  2-3 servings of low-potassium fruits each day. One serving is equal to  cup.  Protein, such as meat, poultry, fish, and eggs. Talk with your doctor or dietitian about the right amount and type of protein to eat.   cup of dairy each day. General information  Follow your doctor's  instructions about how much to drink. You may be told to: ? Write down what you drink. ? Write down the foods you eat that are made mostly from water, such as gelatin and soups. ? Drink from small cups.  Take vitamin and mineral supplements only as told by your doctor.  Take over-the-counter and prescription medicines only as told by your doctor. What foods can I eat?     Fruits Apples. Fresh or frozen berries. Fresh or canned pears, peaches, and pineapple. Grapes. Plums. Vegetables Fresh or frozen broccoli, carrots, and green beans. Cabbage. Cauliflower. Celery. Cucumbers. Eggplant. Radishes. Zucchini. Grains White bread. White rice. Cooked cereal. Unsalted popcorn. Tortillas. Pasta. Meats and other proteins Fresh or frozen beef, pork, chicken, and fish. Eggs. Dairy Cream cheese. Heavy cream. Ricotta cheese. Beverages Apple cider. Cranberry juice. Grape juice. Lemonade. Black coffee. Rice milk (that is not enriched or fortified). Seasonings and condiments Herbs. Spices. Jam and jelly. Honey. Sweets and desserts Sherbet. Cakes. Cookies. Fats and oils Olive oil, canola oil, and safflower oil. Other foods Non-dairy creamer. Non-dairy whipped topping. Homemade broth without salt. The items listed above may not be a complete list of foods and beverages you can eat. Contact your dietitian for more options. What foods should I avoid? Fruits Star fruit. Bananas. Oranges. Kiwi. Nectarines. Prunes. Melon. Dried fruit. Avocado. Vegetables Potatoes. Beets. Tomatoes. Winter squash and pumpkin. Asparagus. Spinach. Parsnips. Grains Whole-grain bread. Whole-grain pasta. High-fiber cereal. Meats and other proteins Canned, smoked, and  cured meats. Packaged lunch meat. Sardines. Nuts and seeds. Peanut butter. Beans and legumes. Dairy Milk. Buttermilk. Yogurt. Cheese and cottage cheese. Processed cheese spreads. Beverages Orange juice. Prune juice. Carbonated soft drinks. Seasonings and  condiments Salt. Salt substitutes. Soy sauce. Sweets and desserts Ice cream. Chocolate. Candied nuts. Fats and oils Butter. Margarine. Other foods Ready-made frozen meals. Canned soups. The items listed above may not be a complete list of foods and beverages you should avoid. Contact your dietitian for more information. Summary  If you are having dialysis, it is important to watch what you eat. Certain nutrients and wastes can build up in your blood and cause you to get sick.  Your dietitian will help you make an eating plan that meets your needs.  Avoid foods that are high in potassium, salt (sodium), and phosphorus. Restrict fluids as told by your doctor or dietitian. This information is not intended to replace advice given to you by your health care provider. Make sure you discuss any questions you have with your health care provider. Document Revised: 03/08/2017 Document Reviewed: 12/21/2016 Elsevier Patient Education  [place].

## 2019-04-01 NOTE — Discharge Summary (Addendum)
Physician Discharge Summary  Craig Koch DJM:[id number] DOB: 25-Feb-1955 DOA: 03/26/2019  PCP: Tonia Ghent, MD  Admit date: 03/26/2019 Discharge date: 04/01/2019  Admitted From: home Disposition: home  Recommendations for Outpatient Follow-up:  1. Follow up with PCP in 1-2 weeks 2. F/u nephro in 1-2 weeks 3. F/u oncology in 1 week    Home Health: no, pt did not want to wait to have home health set up  Equipment/Devices:   Discharge Condition: stable CODE STATUS: DNR Diet recommendation: Heart Healthy / Carb Modified   Brief/Interim Summary: HPI was taken from Dr. Damita Dunnings: Craig Koch is a 64 y.o. male with medical history significant for stage IV lung cancer, ESRD on HD MWF, starting dialysis 3 weeks ago, paroxysmal A. fib , CAD with history of CABG, with recent NSTEMI, chronic combined systolic and diastolic heart failure, uncontrolled diabetes with last A1c 11.3, history of hypertension, recently hospitalized about 2 weeks prior for fluid overload and heart failure exacerbation, when dialysis was initiated, who presents to the emergency room after developing shortness of breath after his dialysis session today.  According to his wife who gives a lot of the history, allowing his recent discharge he had been coming along well but patient was noted to have progressing lower extremity edema.  He denies chest pain.  Has no fever and chills and no cough.  ED Course: On arrival to the emergency room his O2 sat was initially 90% on room air improving to 100% with 2 L and he had otherwise normal vitals.  WBC normal at 4 with hemoglobin 10.4, history is as expected for dialysis patient with potassium 4, bicarb 28.  BNP 475, troponin XIX.  EKG with nonspecific ST-T wave changes, chest x-ray showing pulmonary vascular congestion, left pleural effusion, interstitial edema, heart failure.  Also shows consolidation in left medial base which may represent alveolar edema or pneumonia.  Patient  was started on IV antibiotics for possible HCAP.  Hospitalist consulted for admission.  Hospital Course from Dr. Lenise Herald 03/27/19-04/01/19: Pt presented w/ acute hypoxic respiratory failure secondary to CHF exacerbation, stage IV lung cancer & b/l pleural effusions. Pt did receive HD TTS as per pt's regular schedule for acute renal failure, which was started at the beginning of March 2021. Of note, pt also had a right thoracentesis for right pleural effusion. Pt tolerated the procedure well and was able to be weaned off of supplemental oxygen prior to d/c. Pt has had several thoracentesis in the past secondary to recurrent pleural effusions likely secondary to stage IV lung cancer. Pt is high risk for recurrent pleural effusion requiring thoracentesis. Pt verbalized his understanding. PT/OT saw the pt and recommended home health but unfortunately pt did not want to wait until home health was set up by CM. CM was having difficultly setting up home health. Pt will do outpatient PT.   Discharge Diagnoses:  Active Problems:   Diabetes mellitus with complication (HCC)   Essential hypertension   CAD (coronary artery disease), native coronary artery   Stage 4 lung cancer (HCC)   Acute on chronic combined systolic and diastolic CHF (congestive heart failure) ([facility])   HCAP (healthcare-associated pneumonia)   Hypoxemia   ESRD on hemodialysis (HCC)   Hx of CABG   AF (paroxysmal atrial fibrillation) (HCC)   Acute on chronic combined systolic (congestive) and diastolic (congestive) heart failure (HCC)  Acute hypoxic respiratory failure: etiology likely multifactorial related to heart failure exacerbation, underlying stage IV lung cancer, & left  pleural effusion. Weaned off of supplemental oxygen. Resolved  Acute on chronic combined CHF: continue on lasix, metoprolol, statin. Monitor I/Os  Acute renal failure: on HD TTS. Still makes urine. Management per nephro  Hypokalemia: managed w/ HD, as per  nephro.  B/l pleural effusion: likely secondary to stage IV lung cancer & will likely reoccur. Discussed this with the pt, & he verbalized his understanding. Will continue on IV lasix. Neg approx 1 L. S/p right thoracentesis on 03/29/19. Improved respiratory status, no longer requiring O2  Leukopenia: etiology unclear. Continues to trend down. With get CBC w/ diff tomorrow if pt still inpatient   ACD: likely secondary to CKD. No need for a transfusion. Will continue to monitor   Unlikely HCAP: recently treated for pneumonia during his hospitalization. Low suspicion for repeat pneumonia given afebrile and normal white cell count. IV Rocephin, azithromycin and vancomycin x1. Procalcitonin <0.10. Abxs were d/c  CAD: hx of CABG, NSTEMI 03/04/2019. Patient denies chest pain and EKG shows no acute ST-T wave changes. Continue on aspirin, metoprolol, statin  PAF: continue on amiodarone, metoprolol and eliquis.  DM2: uncontrolled, HbA1c 11.3. Continue on SSI w/ accuchecks  Essential hypertension: continue on metoprolol, amiodarone   Stage 4 lung cancer:  continue outpatient follow-up with oncology  Generalized weakness: PT/OT recs home health. Home health orders placed   Discharge Instructions  Discharge Instructions    AMB referral to CHF clinic   Complete by: As directed    AMB referral to pulmonary rehabilitation   Complete by: As directed    Please select a program: Respiratory Care Services   Respiratory Care Services Diagnosis: Heart Failure   After initial evaluation and assessments completed: Virtual Based Care may be provided alone or in conjunction with Pulmonary Rehab/Respiratory Care services based on patient barriers.: Yes   Diet - low sodium heart healthy   Complete by: As directed    Renal diet   Discharge instructions   Complete by: As directed    F/u PCP in 1-2 weeks; F/u nephro in 1-2 weeks; f/u oncology in 1 week; Will need a referral from your PCP for outpatient  physical therapy   Increase activity slowly   Complete by: As directed      Allergies as of 04/01/2019      Reactions   Lipitor [atorvastatin Calcium] Other (See Comments)   Aches.  Tolerated crestor.       Medication List    TAKE these medications   acetaminophen 325 MG tablet Commonly known as: TYLENOL Take 2 tablets (650 mg total) by mouth every 6 (six) hours as needed for mild pain or fever.   albuterol 108 (90 Base) MCG/ACT inhaler Commonly known as: VENTOLIN HFA Inhale 2 puffs into the lungs every 6 (six) hours as needed for wheezing or shortness of breath.   amiodarone 200 MG tablet Commonly known as: PACERONE Take 1 tablet (200 mg total) by mouth daily.   apixaban 5 MG Tabs tablet Commonly known as: ELIQUIS Take 1 tablet (5 mg total) by mouth 2 (two) times daily.   clopidogrel 75 MG tablet Commonly known as: PLAVIX Take 75 mg by mouth daily.   dextromethorphan-guaiFENesin 30-600 MG 12hr tablet Commonly known as: MUCINEX DM Take 1 tablet by mouth 2 (two) times daily.   dronabinol 5 MG capsule Commonly known as: MARINOL Take 1 capsule (5 mg total) by mouth 2 (two) times daily before a meal.   DULoxetine 30 MG capsule Commonly known as: CYMBALTA TAKE 1 CAPSULE  BY MOUTH EVERY DAY What changed: how much to take   Ensure Max Protein Liqd Take 330 mLs (11 oz total) by mouth 2 (two) times daily between meals.   ferrous sulfate 325 (65 FE) MG tablet Take 1 tablet (325 mg total) by mouth 2 (two) times daily with a meal.   folic acid 1 MG tablet Commonly known as: FOLVITE Take 1 tablet (1 mg total) by mouth daily.   insulin aspart 100 UNIT/ML injection Commonly known as: novoLOG Inject 0-9 Units into the skin 3 (three) times daily with meals. Take 0 units if CBG 70-120 Take 1 unit if CBG 121-150 Take 2 units if CBG 151-200 Take 3 units if CBG 201-250 Take 5 units if CBG 251-300 Take 7 units if CBG 301-350 Take 9 units if CBG 351-400 If CBG > 400, give 12  units and call MD   insulin detemir 100 UNIT/ML injection Commonly known as: LEVEMIR Inject 0.2 mLs (20 Units total) into the skin at bedtime.   metoprolol succinate 25 MG 24 hr tablet Commonly known as: TOPROL-XL Take 0.5 tablets (12.5 mg total) by mouth daily.   multivitamin Tabs tablet Take 1 tablet by mouth at bedtime.   nicotine 21 mg/24hr patch Commonly known as: NICODERM CQ - dosed in mg/24 hours Place 1 patch (21 mg total) onto the skin daily.   ondansetron 8 MG tablet Commonly known as: ZOFRAN Take 1 tablet (8 mg total) by mouth every 8 (eight) hours as needed for nausea or vomiting.   polyethylene glycol 17 g packet Commonly known as: MIRALAX / GLYCOLAX Take 17 g by mouth daily.   rosuvastatin 10 MG tablet Commonly known as: CRESTOR TAKE 1 TABLET (10 MG TOTAL) BY MOUTH DAILY.   simethicone 80 MG chewable tablet Commonly known as: MYLICON Chew 1 tablet (80 mg total) by mouth 4 (four) times daily as needed for flatulence.   sodium chloride 0.65 % Soln nasal spray Commonly known as: OCEAN Place 1 spray into both nostrils as needed for congestion.   thiamine 100 MG tablet Take 1 tablet (100 mg total) by mouth daily.   triamcinolone cream 0.1 % Commonly known as: KENALOG Apply 1 application topically 2 (two) times daily.            Durable Medical Equipment  (From admission, onward)         Start     Ordered   03/30/19 0811  For home use only DME Walker rolling  Once    Question Answer Comment  Walker: With [employer] Wheels   Patient needs a walker to treat with the following condition Generalized weakness      03/30/19 0810   03/30/19 0810  For home use only DME 3 n 1  Once     03/30/19 0810   03/30/19 0810  For home use only DME Shower stool  Once     03/30/19 0810         Follow-up Information    [facility] up on 04/05/2019.   Specialty: Cardiology Why: at 10:00am. Enter through the [facility] entrance Contact information: [place] [phone]       Tonia Ghent, MD. Schedule an appointment as soon as possible for a visit in 5 day(s).   Specialty: Family Medicine Contact information: [address] [place] [address] [phone]  Allergies  Allergen Reactions  . Lipitor [Atorvastatin Calcium] Other (See Comments)    Aches.  Tolerated crestor.     Consultations: nephro   Procedures/Studies: DG Chest 1 View  Result Date: 03/14/2019 CLINICAL DATA:  Wheezing EXAM: CHEST  1 VIEW COMPARISON:  03/03/2019 FINDINGS: Borderline heart size.  CABG. Diffuse interstitial opacity with Kerley lines and left more than right pleural effusion. No pneumothorax. IMPRESSION: CHF. Electronically Signed   By: Monte Fantasia M.D.   On: 03/14/2019 05:47   DG Chest 2 View  Result Date: 03/28/2019 CLINICAL DATA:  End-stage renal disease EXAM: CHEST - 2 VIEW COMPARISON:  03/26/2019 FINDINGS: Cardiac shadow is mildly enlarged but stable. Postsurgical changes and dialysis catheter are again seen. Bilateral pleural effusions are seen stable in appearance from the prior exam. Mild vascular congestion is again noted but slightly improved. No focal confluent infiltrate is seen. IMPRESSION: Slight improvement in the degree of vascular congestion. Bilateral pleural effusions stable from the prior exam. Electronically Signed   By: Inez Catalina M.D.   On: 03/28/2019 14:22   DG Chest 2 View  Result Date: 03/26/2019 CLINICAL DATA:  Shortness of breath EXAM: CHEST - 2 VIEW COMPARISON:  March 15, 2019 FINDINGS: Heart is mildly enlarged with pulmonary venous hypertension. There is a pleural effusion on the left. There is consolidation in the medial left base. There is mild interstitial edema present with atelectatic change in the left mid and lower lung zones. Patient is status post coronary artery bypass grafting. Central  catheter tip is in the superior vena cava. No pneumothorax. No bone lesions. IMPRESSION: Cardiomegaly with pulmonary vascular congestion. Left pleural effusion. There is a degree of interstitial edema. Suspect a degree of congestive heart failure. Consolidation in the medial left base may represent alveolar edema or pneumonia. Both entities may be present concurrently. Central catheter tip is in the superior vena cava. Patient is status post coronary artery bypass grafting. Electronically Signed   By: Lowella Grip III M.D.   On: 03/26/2019 16:56   CT HEAD WO CONTRAST  Result Date: 03/05/2019 CLINICAL DATA:  Metastatic disease evaluation.  Lung cancer. EXAM: CT HEAD WITHOUT CONTRAST TECHNIQUE: Contiguous axial images were obtained from the base of the skull through the vertex without intravenous contrast. COMPARISON:  MRI head 09/20/2018 FINDINGS: Brain: Mild atrophy. Mild hypodensity in the white matter unchanged from the prior MRI. No acute infarct, hemorrhage, mass. No edema or midline shift. Vascular: Negative for hyperdense vessel Skull: Negative skull. Sinuses/Orbits: Mucosal edema and bony thickening right maxillary sinus. Chronic nasal bone fracture. Left cataract extraction. Other: None IMPRESSION: No acute abnormality and negative for metastatic disease on unenhanced CT. Atrophy and mild chronic microvascular ischemia. Electronically Signed   By: Franchot Gallo M.D.   On: 03/05/2019 15:28   CT CHEST WO CONTRAST  Result Date: 03/14/2019 CLINICAL DATA:  Respiratory failure. EXAM: CT CHEST WITHOUT CONTRAST TECHNIQUE: Multidetector CT imaging of the chest was performed following the standard protocol without IV contrast. COMPARISON:  Chest CT dated 01/22/2019. FINDINGS: Cardiovascular: Heart size appears stable. No pericardial effusion. Diffuse coronary artery calcifications. Surgical changes of median sternotomy for presumed CABG. Mediastinum/Nodes: No mass or enlarged lymph nodes seen within the  mediastinum. Esophagus appears normal. Trachea is unremarkable. Lungs/Pleura: New RIGHT pleural effusion, moderate to large in size. Associated compressive atelectasis within the RIGHT perihilar and lower lobe. New ill-defined consolidation within the LEFT lower lobe, suspected pneumonia. Probable additional rounded atelectasis within the lingula. Diffuse interstitial thickening indicating some  degree of volume overload versus atypical infection. Upper Abdomen: Limited images of the upper abdomen are unremarkable. Musculoskeletal: No acute or suspicious osseous finding. IMPRESSION: 1. New ill-defined consolidation within the LEFT lower lobe, suspected pneumonia. 2. New moderate to large-sized RIGHT pleural effusion, with associated compressive atelectasis. 3. Diffuse interstitial thickening, most likely edema/fluid overload, less likely additional atypical infection. 4. Probable additional rounded atelectasis within the lingula. Aortic Atherosclerosis (ICD10-I70.0). Electronically Signed   By: Franki Cabot M.D.   On: 03/14/2019 19:47   US RENAL  Result Date: 03/10/2019 CLINICAL DATA:  64 year old presenting with acute renal failure. Evaluate for obstruction as a possible cause. EXAM: RENAL / URINARY TRACT ULTRASOUND COMPLETE COMPARISON:  Urinary tract ultrasound 03/04/2019. CT abdomen and pelvis 01/22/2019. FINDINGS: Right Kidney: Renal measurements: Approximately 12.6 x 6.4 x 6.2 cm = volume: 264 mL. Mildly echogenic parenchyma. No hydronephrosis. Well-preserved cortex. No shadowing calculi. No focal parenchymal abnormality. Left Kidney: Renal measurements: Approximately 12.5 x 6.3 x 6.9 cm = volume: 283 mL. Mildly echogenic parenchyma. No hydronephrosis. Well-preserved cortex. No shadowing calculi. No focal parenchymal abnormality. Bladder: Normal for degree of bladder distention. Other: None. IMPRESSION: 1. Mildly echogenic renal parenchyma indicative of medical renal disease. 2. Otherwise normal examination.  Specifically, no evidence of urinary tract obstruction to account for the acute renal insufficiency. Electronically Signed   By: Evangeline Dakin M.D.   On: 03/10/2019 15:02   US RENAL  Result Date: 03/04/2019 CLINICAL DATA:  Acute kidney injury EXAM: RENAL / URINARY TRACT ULTRASOUND COMPLETE COMPARISON:  CT 01/22/2019 FINDINGS: Right Kidney: Renal measurements: 11.4 x 6 x 6 cm = volume: 215 mL . Echogenicity within normal limits. No mass or hydronephrosis visualized. Left Kidney: Renal measurements: 11.8 x 7.3 x 6.5 cm = volume: 291.8 mL. Echogenicity within normal limits. No mass or hydronephrosis visualized. Bladder: Appears normal for degree of bladder distention. Other: Incidental note made of gallstones and small effusions. Heterogenous prostate. IMPRESSION: Normal ultrasound appearance of the kidneys Electronically Signed   By: Donavan Foil M.D.   On: 03/04/2019 15:37   PERIPHERAL VASCULAR CATHETERIZATION  Result Date: 03/14/2019 See op note  PERIPHERAL VASCULAR CATHETERIZATION  Result Date: 03/11/2019 See op note  DG Chest Port 1 View  Result Date: 03/29/2019 CLINICAL DATA:  Post thoracentesis. EXAM: PORTABLE CHEST 1 VIEW COMPARISON:  03/28/2019. FINDINGS: Post thoracentesis chest x-ray reveals no evidence of pneumothorax. Significant reduction in right-sided pleural effusion. Small left pleural effusion again noted. Bibasilar atelectasis/infiltrates again noted. Prior CABG. Right IJ line stable position. IMPRESSION: Post thoracentesis chest x-ray reveals no evidence of pneumothorax. Significant reduction in right-sided pleural effusion. Electronically Signed   By: Marcello Moores  Register   On: 03/29/2019 15:01   DG Chest Port 1 View  Result Date: 03/15/2019 CLINICAL DATA:  Status post thoracentesis. EXAM: PORTABLE CHEST 1 VIEW COMPARISON:  03/14/2019 FINDINGS: Interval decrease right pleural effusion. No evidence for pneumothorax. Bibasilar atelectasis/infiltrate again noted with persistent  small left pleural effusion. The cardiopericardial silhouette is within normal limits for size. Pulmonary vascular congestion has decreased in the interval. Right IJ central line tip overlies the mid to distal SVC. The visualized bony structures of the thorax are intact. Telemetry leads overlie the chest. IMPRESSION: Interval decrease in right pleural effusion without evidence of pneumothorax. Decrease in pulmonary vascular congestion with persistent bibasilar atelectasis/infiltrate and small left pleural effusion. Electronically Signed   By: Misty Stanley M.D.   On: 03/15/2019 11:36   DG Chest Portable 1 View  Result Date: 03/03/2019 CLINICAL  DATA:  Shortness of breath, since Monday worsening this morning around 1 a.m. EXAM: PORTABLE CHEST 1 VIEW COMPARISON:  12/19/2018 FINDINGS: Cardiomediastinal contours are stable following median sternotomy with persistent left-sided pleural effusion and pleural-parenchymal scarring. No new areas of consolidation or evidence of right-sided pleural effusion. No signs of acute bone process. IMPRESSION: Persistent left-sided pleural effusion and pleural-parenchymal scarring. Electronically Signed   By: Zetta Bills M.D.   On: 03/03/2019 13:59   ECHOCARDIOGRAM COMPLETE  Result Date: 03/15/2019    ECHOCARDIOGRAM REPORT   Patient Name:   TIMOTHY TRUDELL Date of Exam: 03/15/2019 Medical Rec #:  [id number]       Height:       72.0 in Accession #:    [id number]      Weight:       201.4 lb Date of Birth:  01/07/1955       BSA:          2.137 m Patient Age:    31 years        BP:           125/69 mmHg Patient Gender: M               HR:           83 bpm. Exam Location:  [facility] Procedure: 2D Echo, Cardiac Doppler and Color Doppler Indications:     Respiratory distress  History:         Patient has prior history of Echocardiogram examinations, most                  recent 03/04/2019. Prior CABG; Risk Factors:Hypertension and                  Diabetes.  Sonographer:     Sherrie Sport RDCS  (AE) Referring Phys:  [id number] Claiborne Billings A GRIFFITH Diagnosing Phys: Kathlyn Sacramento MD IMPRESSIONS  1. Left ventricular ejection fraction, by estimation, is 35 to 40%. The left ventricle has moderately decreased function. The left ventricle demonstrates regional wall motion abnormalities . The left ventricular internal cavity size was mildly dilated. There is mild left ventricular hypertrophy. Left ventricular diastolic parameters are consistent with Grade II diastolic dysfunction (pseudonormalization). There is moderate hypokinesis of the left ventricular, entire inferior wall.  2. Right ventricular systolic function is normal. The right ventricular size is normal. There is moderately elevated pulmonary artery systolic pressure. The estimated right ventricular systolic pressure is 00.9 mmHg.  3. Left atrial size was mildly dilated.  4. The mitral valve is normal in structure. Moderate mitral valve regurgitation. No evidence of mitral stenosis.  5. The aortic valve is abnormal. Aortic valve regurgitation is mild. Mild to moderate aortic valve sclerosis/calcification is present, without any evidence of aortic stenosis.  6. The inferior vena cava is normal in size with greater than 50% respiratory variability, suggesting right atrial pressure of 3 mmHg. FINDINGS  Left Ventricle: Left ventricular ejection fraction, by estimation, is 35 to 40%. The left ventricle has moderately decreased function. The left ventricle demonstrates regional wall motion abnormalities. Moderate hypokinesis of the left ventricular, entire inferior wall. The left ventricular internal cavity size was mildly dilated. There is mild left ventricular hypertrophy. Left ventricular diastolic parameters are consistent with Grade II diastolic dysfunction (pseudonormalization). Right Ventricle: The right ventricular size is normal. No increase in right ventricular wall thickness. Right ventricular systolic function is normal. There is moderately elevated  pulmonary artery systolic pressure. The tricuspid regurgitant velocity is 3.37 m/s, and  with an assumed right atrial pressure of 3 mmHg, the estimated right ventricular systolic pressure is 84.6 mmHg. Left Atrium: Left atrial size was mildly dilated. Right Atrium: Right atrial size was normal in size. Pericardium: There is no evidence of pericardial effusion. Mitral Valve: The mitral valve is normal in structure. Normal mobility of the mitral valve leaflets. Moderate mitral valve regurgitation. No evidence of mitral valve stenosis. Tricuspid Valve: The tricuspid valve is normal in structure. Tricuspid valve regurgitation is mild . No evidence of tricuspid stenosis. Aortic Valve: The aortic valve is abnormal. Aortic valve regurgitation is mild. Mild to moderate aortic valve sclerosis/calcification is present, without any evidence of aortic stenosis. Aortic valve mean gradient measures 4.5 mmHg. Aortic valve peak gradient measures 8.4 mmHg. Aortic valve area, by VTI measures 2.54 cm. Pulmonic Valve: The pulmonic valve was normal in structure. Pulmonic valve regurgitation is not visualized. No evidence of pulmonic stenosis. Aorta: The aortic root is normal in size and structure. Venous: The inferior vena cava is normal in size with greater than 50% respiratory variability, suggesting right atrial pressure of 3 mmHg. IAS/Shunts: No atrial level shunt detected by color flow Doppler.  LEFT VENTRICLE PLAX 2D LVIDd:         5.34 cm      Diastology LVIDs:         4.38 cm      LV e' lateral:   5.77 cm/s LV PW:         1.00 cm      LV E/e' lateral: 22.4 LV IVS:        1.20 cm      LV e' medial:    5.00 cm/s LVOT diam:     2.00 cm      LV E/e' medial:  25.8 LV SV:         63 LV SV Index:   30 LVOT Area:     3.14 cm  LV Volumes (MOD) LV vol d, MOD A2C: 227.0 ml LV vol d, MOD A4C: 148.0 ml LV vol s, MOD A2C: 142.0 ml LV vol s, MOD A4C: 110.0 ml LV SV MOD A2C:     85.0 ml LV SV MOD A4C:     148.0 ml LV SV MOD BP:      59.1 ml  RIGHT VENTRICLE RV Basal diam:  4.14 cm RV S prime:     10.30 cm/s TAPSE (M-mode): 3.2 cm LEFT ATRIUM             Index       RIGHT ATRIUM           Index LA diam:        4.80 cm 2.25 cm/m  RA Area:     15.70 cm LA Vol (A2C):   68.8 ml 32.20 ml/m RA Volume:   40.00 ml  18.72 ml/m LA Vol (A4C):   77.9 ml 36.45 ml/m LA Biplane Vol: 75.1 ml 35.14 ml/m  AORTIC VALVE                   PULMONIC VALVE AV Area (Vmax):    2.41 cm    PV Vmax:        0.99 m/s AV Area (Vmean):   2.15 cm    PV Peak grad:   3.9 mmHg AV Area (VTI):     2.54 cm    RVOT Peak grad: 4 mmHg AV Vmax:           144.50 cm/s  AV Vmean:          94.950 cm/s AV VTI:            0.249 m AV Peak Grad:      8.4 mmHg AV Mean Grad:      4.5 mmHg LVOT Vmax:         111.00 cm/s LVOT Vmean:        65.100 cm/s LVOT VTI:          0.201 m LVOT/AV VTI ratio: 0.81  AORTA Ao Root diam: 2.90 cm MITRAL VALVE                TRICUSPID VALVE MV Area (PHT): 5.13 cm     TR Peak grad:   45.4 mmHg MV Decel Time: 148 msec     TR Vmax:        337.00 cm/s MV E velocity: 129.00 cm/s MV A velocity: 68.40 cm/s   SHUNTS MV E/A ratio:  1.89         Systemic VTI:  0.20 m                             Systemic Diam: 2.00 cm Kathlyn Sacramento MD Electronically signed by Kathlyn Sacramento MD Signature Date/Time: 03/15/2019/11:27:06 AM    Final    ECHOCARDIOGRAM COMPLETE  Result Date: 03/04/2019    ECHOCARDIOGRAM REPORT   Patient Name:   LANTZ HERMANN Date of Exam: 03/04/2019 Medical Rec #:  [id number]       Height:       72.0 in Accession #:    [id number]      Weight:       185.0 lb Date of Birth:  December 27, 1955       BSA:          2.061 m Patient Age:    13 years        BP:           125/72 mmHg Patient Gender: M               HR:           119 bpm. Exam Location:  [facility] Procedure: 2D Echo, Color Doppler and Cardiac Doppler Indications:     I21.4 NSTEMI  History:         Patient has prior history of Echocardiogram examinations, most                  recent 08/24/2018. CAD, Prior CABG; Risk  Factors:Hypertension,                  Dyslipidemia and Diabetes. Lung Cancer.  Sonographer:     Charmayne Sheer RDCS (AE) Referring Phys:  [place] Phys: Kathlyn Sacramento MD IMPRESSIONS  1. Left ventricular ejection fraction, by estimation, is 30 to 35%. The left ventricle has moderately decreased function. The left ventricle demonstrates global hypokinesis. Left ventricular diastolic parameters are indeterminate.  2. Right ventricular systolic function is normal. The right ventricular size is normal. Tricuspid regurgitation signal is inadequate for assessing PA pressure.  3. Left atrial size was mild to moderately dilated.  4. The mitral valve is normal in structure and function. Moderate mitral valve regurgitation. No evidence of mitral stenosis.  5. The aortic valve is normal in structure and function. Aortic valve regurgitation is trivial. Mild to moderate aortic valve sclerosis/calcification is present, without any evidence of aortic stenosis.  6.  The inferior vena cava is normal in size with greater than 50% respiratory variability, suggesting right atrial pressure of 3 mmHg. FINDINGS  Left Ventricle: Left ventricular ejection fraction, by estimation, is 30 to 35%. The left ventricle has moderately decreased function. The left ventricle demonstrates global hypokinesis. The left ventricular internal cavity size was normal in size. There is no left ventricular hypertrophy. Left ventricular diastolic parameters are indeterminate. Right Ventricle: The right ventricular size is normal. No increase in right ventricular wall thickness. Right ventricular systolic function is normal. Tricuspid regurgitation signal is inadequate for assessing PA pressure. Left Atrium: Left atrial size was mild to moderately dilated. Right Atrium: Right atrial size was normal in size. Pericardium: There is no evidence of pericardial effusion. Mitral Valve: The mitral valve is normal in structure and function. Normal  mobility of the mitral valve leaflets. Moderate mitral valve regurgitation. No evidence of mitral valve stenosis. MV peak gradient, 7.5 mmHg. The mean mitral valve gradient is 4.0  mmHg. Tricuspid Valve: The tricuspid valve is normal in structure. Tricuspid valve regurgitation is not demonstrated. No evidence of tricuspid stenosis. Aortic Valve: The aortic valve is normal in structure and function. Aortic valve regurgitation is trivial. Aortic regurgitation PHT measures 281 msec. Mild to moderate aortic valve sclerosis/calcification is present, without any evidence of aortic stenosis. Aortic valve mean gradient measures 5.0 mmHg. Aortic valve peak gradient measures 8.5 mmHg. Aortic valve area, by VTI measures 2.53 cm. Pulmonic Valve: The pulmonic valve was normal in structure. Pulmonic valve regurgitation is not visualized. No evidence of pulmonic stenosis. Aorta: The aortic root is normal in size and structure. Venous: The inferior vena cava is normal in size with greater than 50% respiratory variability, suggesting right atrial pressure of 3 mmHg. IAS/Shunts: No atrial level shunt detected by color flow Doppler.  LEFT VENTRICLE PLAX 2D LVIDd:         5.21 cm  Diastology LVIDs:         4.21 cm  LV e' lateral:   7.83 cm/s LV PW:         0.84 cm  LV E/e' lateral: 13.4 LV IVS:        0.89 cm  LV e' medial:    7.94 cm/s LVOT diam:     2.40 cm  LV E/e' medial:  13.2 LV SV:         54 LV SV Index:   26 LVOT Area:     4.52 cm  RIGHT VENTRICLE RV Basal diam:  3.08 cm LEFT ATRIUM             Index       RIGHT ATRIUM           Index LA diam:        4.70 cm 2.28 cm/m  RA Area:     14.10 cm LA Vol (A2C):   62.0 ml 30.08 ml/m RA Volume:   35.20 ml  17.08 ml/m LA Vol (A4C):   68.6 ml 33.29 ml/m LA Biplane Vol: 65.3 ml 31.68 ml/m  AORTIC VALVE                    PULMONIC VALVE AV Area (Vmax):    2.95 cm     PV Vmax:       1.10 m/s AV Area (Vmean):   2.86 cm     PV Vmean:      68.400 cm/s AV Area (VTI):     2.53 cm  PV VTI:        0.146 m AV Vmax:           146.00 cm/s  PV Peak grad:  4.8 mmHg AV Vmean:          103.000 cm/s PV Mean grad:  2.0 mmHg AV VTI:            0.213 m AV Peak Grad:      8.5 mmHg AV Mean Grad:      5.0 mmHg LVOT Vmax:         95.30 cm/s LVOT Vmean:        65.200 cm/s LVOT VTI:          0.119 m LVOT/AV VTI ratio: 0.56 AI PHT:            281 msec  AORTA Ao Root diam: 3.00 cm MITRAL VALVE MV Area (PHT): 3.92 cm     SHUNTS MV Peak grad:  7.5 mmHg     Systemic VTI:  0.12 m MV Mean grad:  4.0 mmHg     Systemic Diam: 2.40 cm MV Vmax:       1.37 m/s MV Vmean:      92.7 cm/s MV Decel Time: 194 msec MV E velocity: 104.80 cm/s Kathlyn Sacramento MD Electronically signed by Kathlyn Sacramento MD Signature Date/Time: 03/04/2019/2:46:43 PM    Final    [place] RT UPPER EXTREM LTD SOFT TISSUE NON VASCULAR  Result Date: 03/08/2019 CLINICAL DATA:  Swelling and bruising of the right upper extremity. Possible hematoma. EXAM: ULTRASOUND right UPPER EXTREMITY LIMITED TECHNIQUE: Ultrasound examination of the upper extremity soft tissues was performed in the area of clinical concern. COMPARISON:  None. FINDINGS: Diffuse subcutaneous soft tissue swelling/edema and streaky fluid. No discrete fluid collection or hematoma. The underlying musculature is grossly normal. IMPRESSION: Diffuse subcutaneous soft tissue swelling/edema/streaky fluid but no discrete abscess or hematoma. Electronically Signed   By: Marijo Sanes M.D.   On: 03/08/2019 19:13   US THORACENTESIS ASP PLEURAL SPACE W/IMG GUIDE  Result Date: 03/29/2019 INDICATION: Pleural effusion. EXAM: ULTRASOUND GUIDED RIGHT THORACENTESIS MEDICATIONS: None. COMPLICATIONS: None immediate. PROCEDURE: An ultrasound guided thoracentesis was thoroughly discussed with the patient and questions answered. The benefits, risks, alternatives and complications were also discussed. The patient understands and wishes to proceed with the procedure. Written consent was obtained. Ultrasound was performed  to localize and mark an adequate pocket of fluid in the right chest. The area was then prepped and draped in the normal sterile fashion. 1% Lidocaine was used for local anesthesia. Under ultrasound guidance a 6 French catheter was introduced. Thoracentesis was performed. The catheter was removed and a dressing applied. FINDINGS: A total of approximately 1 L of yellow fluid was removed. Samples were sent to the laboratory as requested by the clinical team. IMPRESSION: Successful ultrasound guided right thoracentesis yielding 1 L of pleural fluid. Electronically Signed   By: Marcello Moores  Register   On: 03/29/2019 15:04   US THORACENTESIS ASP PLEURAL SPACE W/IMG GUIDE  Result Date: 03/15/2019 INDICATION: History of lung cancer with recurrent right pleural effusion. Request for diagnostic and therapeutic thoracentesis. EXAM: ULTRASOUND GUIDED RIGHT THORACENTESIS MEDICATIONS: 1% lidocaine 10 mL COMPLICATIONS: None immediate. PROCEDURE: An ultrasound guided thoracentesis was thoroughly discussed with the patient and questions answered. The benefits, risks, alternatives and complications were also discussed. The patient understands and wishes to proceed with the procedure. Written consent was obtained. Ultrasound was performed to localize and mark an adequate pocket of fluid in the right  chest. The area was then prepped and draped in the normal sterile fashion. 1% Lidocaine was used for local anesthesia. Under ultrasound guidance a 6 Fr Safe-T-Centesis catheter was introduced. Thoracentesis was performed. The catheter was removed and a dressing applied. FINDINGS: A total of approximately 2.7 L of clear yellow fluid was removed. Samples were sent to the laboratory as requested by the clinical team. IMPRESSION: Successful ultrasound guided right thoracentesis yielding 2.7 L of pleural fluid. No pneumothorax on post-procedure chest x-ray. Read by: Gareth Eagle, PA-C Electronically Signed   By: Sandi Mariscal M.D.   On: 03/15/2019  12:44       Subjective: Pt c/o malaise    Discharge Exam: Vitals:   04/01/19 0752 04/01/19 1142  BP: 119/69 109/63  Pulse: 89 84  Resp: 19 19  Temp: 98.3 F (36.8 C) 99 F (37.2 C)  SpO2: 91% 93%   Vitals:   04/01/19 0500 04/01/19 0722 04/01/19 0752 04/01/19 1142  BP:  (!) 144/71 119/69 109/63  Pulse:  71 89 84  Resp:  19 19 19   Temp:  98.9 F (37.2 C) 98.3 F (36.8 C) 99 F (37.2 C)  TempSrc:  Oral Oral Oral  SpO2: 93% (!) 88% 91% 93%  Weight:      Height:        General: Pt is alert, awake, not in acute distress Cardiovascular: S1/S2 +, no rubs, no gallops Respiratory: diminished breath sounds b/l. No rales Abdominal: Soft, NT, ND, bowel sounds + Extremities: b/l LE edema, no cyanosis    The results of significant diagnostics from this hospitalization (including imaging, microbiology, ancillary and laboratory) are listed below for reference.     Microbiology: Recent Results (from the past 240 hour(s))  Respiratory Panel by RT PCR (Flu A&B, Covid) - Nasopharyngeal Swab     Status: None   Collection Time: 03/26/19  6:40 PM   Specimen: Nasopharyngeal Swab  Result Value Ref Range Status   SARS Coronavirus 2 by RT PCR NEGATIVE NEGATIVE Final    Comment: (NOTE) SARS-CoV-2 target nucleic acids are NOT DETECTED. The SARS-CoV-2 RNA is generally detectable in upper respiratoy specimens during the acute phase of infection. The lowest concentration of SARS-CoV-2 viral copies this assay can detect is 131 copies/mL. A negative result does not preclude SARS-Cov-2 infection and should not be used as the sole basis for treatment or other patient management decisions. A negative result may occur with  improper specimen collection/handling, submission of specimen other than nasopharyngeal swab, presence of viral mutation(s) within the areas targeted by this assay, and inadequate number of viral copies (<131 copies/mL). A negative result must be combined with  clinical observations, patient history, and epidemiological information. The expected result is Negative. Fact Sheet for Patients:  PinkCheek.be Fact Sheet for Healthcare Providers:  GravelBags.it This test is not yet ap proved or cleared by the [place] FDA and  has been authorized for detection and/or diagnosis of SARS-CoV-2 by FDA under an Emergency Use Authorization (EUA). This EUA will remain  in effect (meaning this test can be used) for the duration of the COVID-19 declaration under Section 564(b)(1) of the Act, 21 U.S.C. section 360bbb-3(b)(1), unless the authorization is terminated or revoked sooner.    Influenza A by PCR NEGATIVE NEGATIVE Final   Influenza B by PCR NEGATIVE NEGATIVE Final    Comment: (NOTE) The Xpert Xpress SARS-CoV-2/FLU/RSV assay is intended as an aid in  the diagnosis of influenza from Nasopharyngeal swab specimens and  should not be  used as a sole basis for treatment. Nasal washings and  aspirates are unacceptable for Xpert Xpress SARS-CoV-2/FLU/RSV  testing. Fact Sheet for Patients: PinkCheek.be Fact Sheet for Healthcare Providers: GravelBags.it This test is not yet approved or cleared by the [place] FDA and  has been authorized for detection and/or diagnosis of SARS-CoV-2 by  FDA under an Emergency Use Authorization (EUA). This EUA will remain  in effect (meaning this test can be used) for the duration of the  Covid-19 declaration under Section 564(b)(1) of the Act, 21  U.S.C. section 360bbb-3(b)(1), unless the authorization is  terminated or revoked. Performed at [facility], [address] [place], [place] [address]   MRSA PCR Screening     Status: None   Collection Time: 03/26/19  8:03 PM   Specimen: Nasal Mucosa; Nasopharyngeal  Result Value Ref Range Status   MRSA by PCR NEGATIVE NEGATIVE Final     Comment:        The GeneXpert MRSA Assay (FDA approved for NASAL specimens only), is one component of a comprehensive MRSA colonization surveillance program. It is not intended to diagnose MRSA infection nor to guide or monitor treatment for MRSA infections. Performed at [facility], [address] [place], [place] [address]   Body fluid culture     Status: None   Collection Time: 03/29/19  2:30 PM   Specimen: PATH Cytology Pleural fluid  Result Value Ref Range Status   Specimen Description   Final    PLEURAL Performed at [facility], [address], [place], [place] [address]    Special Requests   Final    NONE Performed at [facility], [address] [place], [place] [address]    Gram Stain   Final    FEW WBC PRESENT, PREDOMINANTLY MONONUCLEAR NO ORGANISMS SEEN Performed at [facility], [place] [address] [place], [place] [address]    Culture NO GROWTH  Final   Report Status 04/01/2019 FINAL  Final     Labs: BNP (last 3 results) Recent Labs    03/03/19 1330 03/26/19 1702  BNP 330.0* 938.1*   Basic Metabolic Panel: Recent Labs  Lab 03/28/19 0605 03/28/19 2329 03/29/19 0504 03/30/19 0512 03/31/19 0634 04/01/19 0535  NA 137  --  138 136 136 134*  K 3.6  --  3.3* 3.1* 3.8 3.5  CL 98  --  98 94* 97* 94*  CO2 27  --  29 31 32 30  GLUCOSE 282*  --  235* 194* 182* 117*  BUN 25*  --  18 28* 17 30*  CREATININE 2.94*  --  2.08* 2.52* 2.12* 2.80*  CALCIUM 8.8*  --  8.4* 8.5* 8.3* 8.4*  PHOS  --  2.8  --   --   --   --    Liver Function Tests: Recent Labs  Lab 03/26/19 1702  AST 26  ALT 15  ALKPHOS 114  BILITOT 1.0  PROT 7.2  ALBUMIN 2.8*   No results for input(s): LIPASE, AMYLASE in the last 168 hours. No results for input(s): AMMONIA in the last 168 hours. CBC: Recent Labs  Lab 03/26/19 1702 03/27/19 0532 03/28/19 0605 03/29/19 0504 03/30/19 0512 03/31/19 0634 04/01/19 0535  WBC 4.0   < > 4.1 3.9* 3.7*  2.7* 2.5*  NEUTROABS 2.5  --   --   --   --   --   --   HGB 10.4*   < > 10.0* 8.9* 9.2* 8.7*  8.7*  HCT 32.8*   < > 31.3* 28.0* 27.9* 26.8* 26.4*  MCV 89.9   < > 90.5 89.5 87.5 89.3 88.6  PLT 211   < > 298 277 297 276 258   < > = values in this interval not displayed.   Cardiac Enzymes: No results for input(s): CKTOTAL, CKMB, CKMBINDEX, TROPONINI in the last 168 hours. BNP: Invalid input(s): POCBNP CBG: Recent Labs  Lab 03/31/19 1118 03/31/19 1651 03/31/19 2050 04/01/19 0754 04/01/19 1143  GLUCAP 170* 323* 218* 107* 180*   D-Dimer No results for input(s): DDIMER in the last 72 hours. Hgb A1c No results for input(s): HGBA1C in the last 72 hours. Lipid Profile No results for input(s): CHOL, HDL, LDLCALC, TRIG, CHOLHDL, LDLDIRECT in the last 72 hours. Thyroid function studies No results for input(s): TSH, T4TOTAL, T3FREE, THYROIDAB in the last 72 hours.  Invalid input(s): FREET3 Anemia work up No results for input(s): VITAMINB12, FOLATE, FERRITIN, TIBC, IRON, RETICCTPCT in the last 72 hours. Urinalysis    Component Value Date/Time   COLORURINE YELLOW (A) 03/17/2019 1940   APPEARANCEUR HAZY (A) 03/17/2019 1940   LABSPEC 1.017 03/17/2019 1940   PHURINE 5.0 03/17/2019 1940   GLUCOSEU 50 (A) 03/17/2019 1940   HGBUR NEGATIVE 03/17/2019 [place] NEGATIVE 03/17/2019 1940   KETONESUR 5 (A) 03/17/2019 1940   PROTEINUR 100 (A) 03/17/2019 1940   NITRITE NEGATIVE 03/17/2019 1940   LEUKOCYTESUR NEGATIVE 03/17/2019 1940   Sepsis Labs Invalid input(s): PROCALCITONIN,  WBC,  LACTICIDVEN Microbiology Recent Results (from the past 240 hour(s))  Respiratory Panel by RT PCR (Flu A&B, Covid) - Nasopharyngeal Swab     Status: None   Collection Time: 03/26/19  6:40 PM   Specimen: Nasopharyngeal Swab  Result Value Ref Range Status   SARS Coronavirus 2 by RT PCR NEGATIVE NEGATIVE Final    Comment: (NOTE) SARS-CoV-2 target nucleic acids are NOT DETECTED. The SARS-CoV-2 RNA is  generally detectable in upper respiratoy specimens during the acute phase of infection. The lowest concentration of SARS-CoV-2 viral copies this assay can detect is 131 copies/mL. A negative result does not preclude SARS-Cov-2 infection and should not be used as the sole basis for treatment or other patient management decisions. A negative result may occur with  improper specimen collection/handling, submission of specimen other than nasopharyngeal swab, presence of viral mutation(s) within the areas targeted by this assay, and inadequate number of viral copies (<131 copies/mL). A negative result must be combined with clinical observations, patient history, and epidemiological information. The expected result is Negative. Fact Sheet for Patients:  PinkCheek.be Fact Sheet for Healthcare Providers:  GravelBags.it This test is not yet ap proved or cleared by the [place] FDA and  has been authorized for detection and/or diagnosis of SARS-CoV-2 by FDA under an Emergency Use Authorization (EUA). This EUA will remain  in effect (meaning this test can be used) for the duration of the COVID-19 declaration under Section 564(b)(1) of the Act, 21 U.S.C. section 360bbb-3(b)(1), unless the authorization is terminated or revoked sooner.    Influenza A by PCR NEGATIVE NEGATIVE Final   Influenza B by PCR NEGATIVE NEGATIVE Final    Comment: (NOTE) The Xpert Xpress SARS-CoV-2/FLU/RSV assay is intended as an aid in  the diagnosis of influenza from Nasopharyngeal swab specimens and  should not be used as a sole basis for treatment. Nasal washings and  aspirates are unacceptable for Xpert Xpress SARS-CoV-2/FLU/RSV  testing. Fact Sheet for Patients: PinkCheek.be Fact Sheet for Healthcare  Providers: GravelBags.it This test is not yet approved or cleared by the [place] and   has been authorized for detection and/or diagnosis of SARS-CoV-2 by  FDA under an Emergency Use Authorization (EUA). This EUA will remain  in effect (meaning this test can be used) for the duration of the  Covid-19 declaration under Section 564(b)(1) of the Act, 21  U.S.C. section 360bbb-3(b)(1), unless the authorization is  terminated or revoked. Performed at [facility], [address] [place], [place] [address]   MRSA PCR Screening     Status: None   Collection Time: 03/26/19  8:03 PM   Specimen: Nasal Mucosa; Nasopharyngeal  Result Value Ref Range Status   MRSA by PCR NEGATIVE NEGATIVE Final    Comment:        The GeneXpert MRSA Assay (FDA approved for NASAL specimens only), is one component of a comprehensive MRSA colonization surveillance program. It is not intended to diagnose MRSA infection nor to guide or monitor treatment for MRSA infections. Performed at [facility], [address] [place], [place] [address]   Body fluid culture     Status: None   Collection Time: 03/29/19  2:30 PM   Specimen: PATH Cytology Pleural fluid  Result Value Ref Range Status   Specimen Description   Final    PLEURAL Performed at [facility], [address], [place], [name] [address]    Special Requests   Final    NONE Performed at [facility], [address] [place], [place] [address]    Gram Stain   Final    FEW WBC PRESENT, PREDOMINANTLY MONONUCLEAR NO ORGANISMS SEEN Performed at [facility], [place] [address], [place], [place] [address]    Culture NO GROWTH  Final   Report Status 04/01/2019 FINAL  Final     Time coordinating discharge: Over 30 minutes  SIGNED:   Wyvonnia Dusky, MD  [facility] 04/01/2019, 3:02 PM Pager   If 7PM-7AM, please contact night-coverage [email or web address]

## 2019-04-01 NOTE — Care Management Important Message (Signed)
Important Message  Patient Details  Name: Craig Koch MRN: [id number] Date of Birth: 02/20/1955   Medicare Important Message Given:  Yes     [name] 04/01/2019, 1:44 PM

## 2019-04-02 ENCOUNTER — Telehealth: Payer: Self-pay

## 2019-04-02 ENCOUNTER — Telehealth: Payer: Self-pay | Admitting: *Deleted

## 2019-04-02 DIAGNOSIS — N189 Chronic kidney disease, unspecified: Secondary | ICD-10-CM | POA: Diagnosis not present

## 2019-04-02 DIAGNOSIS — N184 Chronic kidney disease, stage 4 (severe): Secondary | ICD-10-CM | POA: Diagnosis not present

## 2019-04-02 DIAGNOSIS — N179 Acute kidney failure, unspecified: Secondary | ICD-10-CM | POA: Diagnosis not present

## 2019-04-02 LAB — CYTOLOGY - NON PAP

## 2019-04-02 NOTE — Telephone Encounter (Signed)
Wife/patient advised.

## 2019-04-02 NOTE — Telephone Encounter (Signed)
Pt was in hospital and when discharged on 04/01/19 pt was given rx for Novolog; while pt was in hospital he was given novolog instead of humalog. Eric pts son (do not see DPR signed for Randall Hiss) wants to know if pt should go back on humalog instead of the novolog. Pt had taken humalog for several years. Since pt has been home pt has been taking humalog (sliding scale) like he did before he went into the hospital. Pt is feeling OK; pt just got home from dialysis.  FBS this morning was 151 and after dialysis BS was 80. Pt has not gotten novolog filled and Randall Hiss thinks was on humalog due to ins guidelines.

## 2019-04-02 NOTE — Telephone Encounter (Signed)
Per MD to R/S pt 03/29/19 lab/MD/Keytruda appts Appts were R/S to 04/08/19 per pt wife [name] request.

## 2019-04-02 NOTE — Telephone Encounter (Signed)
Would be okay to continue with humalog as he had prior.  Thanks.

## 2019-04-03 ENCOUNTER — Telehealth: Payer: Self-pay

## 2019-04-03 DIAGNOSIS — C3490 Malignant neoplasm of unspecified part of unspecified bronchus or lung: Secondary | ICD-10-CM

## 2019-04-03 NOTE — Telephone Encounter (Signed)
I am okay with referral to palliative care assuming the patient is okay with it.  Please verify.  I put in the referral.  Thanks.

## 2019-04-03 NOTE — Telephone Encounter (Signed)
Craig Koch with [facility] palliative division left v/m that pt had a palliative care request in while pt was at [facility]. Pt went from [facility] to admission to hospital and then when discharged pt went home. Craig Koch with [facility] wants to know if Dr Damita Dunnings will do a palliative care referral to pts home since d/c from [place] request cb.

## 2019-04-04 ENCOUNTER — Telehealth: Payer: Self-pay | Admitting: Adult Health Nurse Practitioner

## 2019-04-04 ENCOUNTER — Other Ambulatory Visit: Payer: Medicare Other

## 2019-04-04 ENCOUNTER — Ambulatory Visit: Payer: Medicare Other | Admitting: Oncology

## 2019-04-04 ENCOUNTER — Ambulatory Visit: Payer: Medicare Other

## 2019-04-04 DIAGNOSIS — N179 Acute kidney failure, unspecified: Secondary | ICD-10-CM | POA: Diagnosis not present

## 2019-04-04 DIAGNOSIS — N189 Chronic kidney disease, unspecified: Secondary | ICD-10-CM | POA: Diagnosis not present

## 2019-04-04 DIAGNOSIS — N184 Chronic kidney disease, stage 4 (severe): Secondary | ICD-10-CM | POA: Diagnosis not present

## 2019-04-04 NOTE — Telephone Encounter (Signed)
Left message with ?Sharyn Lull at [place] who says she will get the message over to [place].

## 2019-04-04 NOTE — Progress Notes (Signed)
Patient ID: Craig Koch, male    DOB: [birth year], 65 y.o.   MRN: [id number]  HPI  Craig Koch is a 64 y/o male with a history of lung cancer, CAE, DM, hyperlipidemia, HTN, anxiety, pleural effusions, recent tobacco use and chronic heart failure.   Echo report from 03/15/19 reviewed and showed an EF of 35-40% along with moderately elevated PA pressure of 48.4 mmHg and moderate Craig.  Admitted 03/26/19 due to HF exacerbation. Had right thoracentesis for right pleural effusion. PT/OT eval done. Initially given IV lasix and then transitioned to oral diuretics. Dialysis done while admitted. Discharged after 6 days. Admitted 03/03/19 due to acute respiratory failure due to acute HF. Vascular surgery, nephrology, cardiology, oncology and palliative care consults obtained. Initially needed bipap and IV lasix. Thoracentesis done with removal of 2.7L of fluid.   Discharged after 16 days.   He presents today for his initial visit with a chief complaint of moderate shortness of breath upon minimal exertion. He describes this as chronic in nature. He has associated fatigue, cough, nausea, back pain and weakness along with this. He denies any difficulty sleeping, dizziness, abdominal distention, palpitations, pedal edema, chest pain or weight gain.   Currently doing dialysis on Tues, Thurs & Sat and has palliative care appointment lined up for 04/18/19  Past Medical History:  Diagnosis Date  . Anxiety   . Arthritis   . CHF (congestive heart failure) ([place])   . Coronary artery disease   . Diabetes mellitus   . Dyslipidemia   . Hx of CABG   . Hyperlipidemia   . Hypertension   . Malignant neoplasm of unspecified part of unspecified bronchus or lung ([place]) 08/2018   Immunotherapy  . Pleural effusion    Past Surgical History:  Procedure Laterality Date  . CATARACT EXTRACTION Left 09/2015  . CHEST TUBE INSERTION Left 10/01/2018   Procedure: INSERTION PLEURAL DRAINAGE CATHETER;  Surgeon: [name], MD;   Location: [facility];  Service: Thoracic;  Laterality: Left;  . CORONARY ARTERY BYPASS GRAFT  2004   (CABG with LIMA to the  LAD, SVG to OM2/OM3, SVG  to diag  . DIALYSIS/PERMA CATHETER INSERTION N/A 03/14/2019   Procedure: DIALYSIS/PERMA CATHETER INSERTION;  Surgeon: Algernon Huxley, MD;  Location: [facility];  Service: Cardiovascular;  Laterality: N/A;  . Left ankle surgery     repair of fracture  . Right lower leg surgery     rod  . TEMPORARY DIALYSIS CATHETER N/A 03/11/2019   Procedure: TEMPORARY DIALYSIS CATHETER;  Surgeon: Algernon Huxley, MD;  Location: [facility];  Service: Cardiovascular;  Laterality: N/A;   Family History  Problem Relation Age of Onset  . Dementia Mother   . Heart disease Father   . Colon cancer Neg Hx   . Prostate cancer Neg Hx   . Diabetes Neg Hx    Social History   Tobacco Use  . Smoking status: Former Smoker    Packs/day: 0.20    Years: 45.00    Pack years: 9.00    Types: Cigarettes    Quit date: 03/05/2019    Years since quitting: 0.0  . Smokeless tobacco: Former Systems developer    Types: Snuff  Substance Use Topics  . Alcohol use: Yes    Alcohol/week: 6.0 standard drinks    Types: 6 Cans of beer per week    Comment: occ, average 6 pack in a week   Allergies  Allergen Reactions  . Lipitor [employer]  Calcium] Other (See Comments)    Aches.  Tolerated crestor.    Prior to Admission medications   Medication Sig Start Date End Date Taking? Authorizing Provider  acetaminophen (TYLENOL) 325 MG tablet Take 2 tablets (650 mg total) by mouth every 6 (six) hours as needed for mild pain or fever. 03/19/19  Yes Nicole Kindred A, DO  albuterol (VENTOLIN HFA) 108 (90 Base) MCG/ACT inhaler Inhale 2 puffs into the lungs every 6 (six) hours as needed for wheezing or shortness of breath. 04/01/19 05/01/19 Yes Wyvonnia Dusky, MD  amiodarone (PACERONE) 200 MG tablet Take 1 tablet (200 mg total) by mouth daily. 04/01/19 05/01/19 Yes Wyvonnia Dusky, MD   clopidogrel (PLAVIX) 75 MG tablet Take 1 tablet (75 mg total) by mouth daily. 04/01/19 05/01/19 Yes Wyvonnia Dusky, MD  dextromethorphan-guaiFENesin [facility] DM) 30-600 MG 12hr tablet Take 1 tablet by mouth 2 (two) times daily. 03/19/19  Yes Ezekiel Slocumb, DO  dronabinol (MARINOL) 5 MG capsule Take 1 capsule (5 mg total) by mouth 2 (two) times daily before a meal. 04/01/19 05/01/19 Yes Wyvonnia Dusky, MD  DULoxetine (CYMBALTA) 30 MG capsule Take 1 capsule (30 mg total) by mouth daily. 04/01/19 05/01/19 Yes Wyvonnia Dusky, MD  Ensure Max Protein (ENSURE MAX PROTEIN) LIQD Take 330 mLs (11 oz total) by mouth 2 (two) times daily between meals. 03/19/19  Yes Nicole Kindred A, DO  ferrous sulfate 325 (65 FE) MG tablet Take 1 tablet (325 mg total) by mouth 2 (two) times daily with a meal. 04/01/19 05/01/19 Yes Wyvonnia Dusky, MD  folic acid (FOLVITE) 1 MG tablet Take 1 tablet (1 mg total) by mouth daily. 03/20/19  Yes Nicole Kindred A, DO  insulin aspart (NOVOLOG) 100 UNIT/ML injection Inject 0-9 Units into the skin 3 (three) times daily with meals. Take 0 units if CBG 70-120 Take 1 unit if CBG 121-150 Take 2 units if CBG 151-200 Take 3 units if CBG 201-250 Take 5 units if CBG 251-300 Take 7 units if CBG 301-350 Take 9 units if CBG 351-400 If CBG > 400, give 12 units and call MD 03/19/19  Yes Nicole Kindred A, DO  insulin detemir (LEVEMIR) 100 UNIT/ML injection Inject 0.2 mLs (20 Units total) into the skin at bedtime. 03/19/19  Yes Nicole Kindred A, DO  metoprolol succinate (TOPROL-XL) 25 MG 24 hr tablet Take 0.5 tablets (12.5 mg total) by mouth daily. 04/01/19 05/01/19 Yes Wyvonnia Dusky, MD  multivitamin (RENA-VIT) TABS tablet Take 1 tablet by mouth at bedtime. 03/19/19  Yes Nicole Kindred A, DO  ondansetron (ZOFRAN) 8 MG tablet Take 1 tablet (8 mg total) by mouth every 8 (eight) hours as needed for up to 14 days for nausea or vomiting. 04/01/19 04/15/19 Yes Wyvonnia Dusky, MD   polyethylene glycol (MIRALAX / GLYCOLAX) 17 g packet Take 17 g by mouth daily. 03/20/19  Yes Nicole Kindred A, DO  rosuvastatin (CRESTOR) 10 MG tablet TAKE 1 TABLET (10 MG TOTAL) BY MOUTH DAILY. 01/07/19  Yes Tonia Ghent, MD  simethicone (MYLICON) 80 MG chewable tablet Chew 1 tablet (80 mg total) by mouth 4 (four) times daily as needed for flatulence. 03/19/19  Yes Nicole Kindred A, DO  thiamine 100 MG tablet Take 1 tablet (100 mg total) by mouth daily. 03/20/19  Yes Nicole Kindred A, DO  triamcinolone cream (KENALOG) 0.1 % Apply 1 application topically 2 (two) times daily. Patient not taking: Reported on 04/05/2019 01/12/19   Earlie Server,  MD     Review of Systems  Constitutional: Positive for fatigue. Negative for appetite change.  HENT: Positive for sore throat. Negative for congestion and postnasal drip.   Eyes: Negative.   Respiratory: Positive for cough (productive) and shortness of breath (easily).   Cardiovascular: Negative for chest pain, palpitations and leg swelling.  Gastrointestinal: Positive for nausea. Negative for abdominal distention and abdominal pain.  Endocrine: Negative.   Genitourinary: Negative.   Musculoskeletal: Positive for back pain. Negative for neck pain.  Skin: Negative.   Allergic/Immunologic: Negative.   Neurological: Positive for weakness. Negative for dizziness and light-headedness.  Hematological: Negative for adenopathy. Does not bruise/bleed easily.  Psychiatric/Behavioral: Negative for dysphoric mood and sleep disturbance (sleeping on 2 pillow). The patient is not nervous/anxious.    Vitals:   04/05/19 0959 04/05/19 1016  BP: (!) 95/48 (!) 100/50  Pulse: 76   Resp: 18   SpO2: 96%   Weight: 190 lb (86.2 kg)   Height: 6' (1.829 m)    Wt Readings from Last 3 Encounters:  04/05/19 190 lb (86.2 kg)  04/01/19 180 lb 14.6 oz (82.1 kg)  03/19/19 190 lb (86.2 kg)   Lab Results  Component Value Date   CREATININE 2.80 (H) 04/01/2019   CREATININE 2.12  (H) 03/31/2019   CREATININE 2.52 (H) 03/30/2019     Physical Exam Vitals and nursing note reviewed.  Constitutional:      Appearance: Normal appearance.  HENT:     Head: Normocephalic and atraumatic.  Cardiovascular:     Rate and Rhythm: Normal rate and regular rhythm.  Pulmonary:     Effort: Pulmonary effort is normal. No respiratory distress.     Breath sounds: No wheezing or rales.  Abdominal:     General: There is no distension.     Palpations: Abdomen is soft.  Musculoskeletal:        General: No tenderness.     Cervical back: Neck supple.     Right lower leg: No edema.     Left lower leg: No edema.  Skin:    General: Skin is warm and dry.  Neurological:     General: No focal deficit present.     Mental Status: He is alert and oriented to person, place, and time.  Psychiatric:        Mood and Affect: Mood normal.        Behavior: Behavior normal.        Thought Content: Thought content normal.     Assessment & Plan:  1: Chronic heart failure with reduced ejection fraction- - NYHA class III - euvolemic today - weighing daily and he was instructed to call for an overnight weight gain of > 2 pounds or a weekly weight gain of >5 pounds - not adding salt to his food and the importance of following a 2000mg  sodium diet was discussed; written dietary information and a low sodium cookbook were given to him and his wife - says that he's extremely weak since his recent hospital admission - has palliative care consult scheduled on 04/18/19 - unable to add entresto or titrate up metoprolol succinate due to low BP - BNP 03/26/19 was 475.0  2: HTN- - BP on the low side but was slightly better upon recheck - saw PCP Damita Dunnings) 02/14/19 - BMP 04/01/19 reviewed and showed sodium 134, potassium 3.5, creatinine 2.8 and GFR 23  3: DM with CKD- - A1c 03/26/19 was 8.8% - does dialysis on Tues, Thurs & Sat -  nonfasting glucose at home today was 242  4: Stage IV lung cancer-  - saw  oncology Tasia Catchings) 02/08/19 - palliative care involved - says that he hasn't smoked in the last month   Medication list reviewed.   Return in 6 weeks or sooner for any questions/problems before then.

## 2019-04-05 ENCOUNTER — Other Ambulatory Visit: Payer: Self-pay

## 2019-04-05 ENCOUNTER — Encounter: Payer: Self-pay | Admitting: Family

## 2019-04-05 ENCOUNTER — Ambulatory Visit: Payer: Medicare Other | Attending: Family | Admitting: Family

## 2019-04-05 VITALS — BP 100/50 | HR 76 | Resp 18 | Ht 72.0 in | Wt 190.0 lb

## 2019-04-05 DIAGNOSIS — Z87891 Personal history of nicotine dependence: Secondary | ICD-10-CM | POA: Diagnosis not present

## 2019-04-05 DIAGNOSIS — I509 Heart failure, unspecified: Secondary | ICD-10-CM | POA: Diagnosis present

## 2019-04-05 DIAGNOSIS — E1122 Type 2 diabetes mellitus with diabetic chronic kidney disease: Secondary | ICD-10-CM | POA: Diagnosis not present

## 2019-04-05 DIAGNOSIS — C3490 Malignant neoplasm of unspecified part of unspecified bronchus or lung: Secondary | ICD-10-CM | POA: Diagnosis not present

## 2019-04-05 DIAGNOSIS — M1990 Unspecified osteoarthritis, unspecified site: Secondary | ICD-10-CM | POA: Diagnosis not present

## 2019-04-05 DIAGNOSIS — F419 Anxiety disorder, unspecified: Secondary | ICD-10-CM | POA: Insufficient documentation

## 2019-04-05 DIAGNOSIS — Z79899 Other long term (current) drug therapy: Secondary | ICD-10-CM | POA: Diagnosis not present

## 2019-04-05 DIAGNOSIS — I130 Hypertensive heart and chronic kidney disease with heart failure and stage 1 through stage 4 chronic kidney disease, or unspecified chronic kidney disease: Secondary | ICD-10-CM | POA: Diagnosis not present

## 2019-04-05 DIAGNOSIS — I2510 Atherosclerotic heart disease of native coronary artery without angina pectoris: Secondary | ICD-10-CM | POA: Insufficient documentation

## 2019-04-05 DIAGNOSIS — I10 Essential (primary) hypertension: Secondary | ICD-10-CM

## 2019-04-05 DIAGNOSIS — Z8249 Family history of ischemic heart disease and other diseases of the circulatory system: Secondary | ICD-10-CM | POA: Diagnosis not present

## 2019-04-05 DIAGNOSIS — Z992 Dependence on renal dialysis: Secondary | ICD-10-CM | POA: Diagnosis not present

## 2019-04-05 DIAGNOSIS — E785 Hyperlipidemia, unspecified: Secondary | ICD-10-CM | POA: Insufficient documentation

## 2019-04-05 DIAGNOSIS — Z951 Presence of aortocoronary bypass graft: Secondary | ICD-10-CM | POA: Diagnosis not present

## 2019-04-05 DIAGNOSIS — N189 Chronic kidney disease, unspecified: Secondary | ICD-10-CM | POA: Diagnosis not present

## 2019-04-05 DIAGNOSIS — Z794 Long term (current) use of insulin: Secondary | ICD-10-CM | POA: Diagnosis not present

## 2019-04-05 DIAGNOSIS — I5022 Chronic systolic (congestive) heart failure: Secondary | ICD-10-CM | POA: Insufficient documentation

## 2019-04-05 DIAGNOSIS — Z888 Allergy status to other drugs, medicaments and biological substances status: Secondary | ICD-10-CM | POA: Diagnosis not present

## 2019-04-05 NOTE — Patient Instructions (Addendum)
Continue weighing daily and call for an overnight weight gain of > 2 pounds or a weekly weight gain of >5 pounds. 

## 2019-04-06 DIAGNOSIS — N189 Chronic kidney disease, unspecified: Secondary | ICD-10-CM | POA: Diagnosis not present

## 2019-04-06 DIAGNOSIS — N184 Chronic kidney disease, stage 4 (severe): Secondary | ICD-10-CM | POA: Diagnosis not present

## 2019-04-06 DIAGNOSIS — N179 Acute kidney failure, unspecified: Secondary | ICD-10-CM | POA: Diagnosis not present

## 2019-04-08 ENCOUNTER — Inpatient Hospital Stay: Payer: Medicare Other | Attending: Oncology

## 2019-04-08 ENCOUNTER — Inpatient Hospital Stay (HOSPITAL_BASED_OUTPATIENT_CLINIC_OR_DEPARTMENT_OTHER): Payer: Medicare Other | Admitting: Oncology

## 2019-04-08 ENCOUNTER — Encounter: Payer: Self-pay | Admitting: Oncology

## 2019-04-08 ENCOUNTER — Other Ambulatory Visit: Payer: Self-pay

## 2019-04-08 ENCOUNTER — Inpatient Hospital Stay: Payer: Medicare Other

## 2019-04-08 VITALS — BP 110/67 | HR 92 | Temp 95.3°F | Resp 20 | Wt 177.7 lb

## 2019-04-08 DIAGNOSIS — C3490 Malignant neoplasm of unspecified part of unspecified bronchus or lung: Secondary | ICD-10-CM | POA: Insufficient documentation

## 2019-04-08 DIAGNOSIS — Z87891 Personal history of nicotine dependence: Secondary | ICD-10-CM | POA: Insufficient documentation

## 2019-04-08 DIAGNOSIS — I132 Hypertensive heart and chronic kidney disease with heart failure and with stage 5 chronic kidney disease, or end stage renal disease: Secondary | ICD-10-CM | POA: Insufficient documentation

## 2019-04-08 DIAGNOSIS — I509 Heart failure, unspecified: Secondary | ICD-10-CM | POA: Insufficient documentation

## 2019-04-08 DIAGNOSIS — Z992 Dependence on renal dialysis: Secondary | ICD-10-CM | POA: Diagnosis not present

## 2019-04-08 DIAGNOSIS — J910 Malignant pleural effusion: Secondary | ICD-10-CM | POA: Insufficient documentation

## 2019-04-08 DIAGNOSIS — J449 Chronic obstructive pulmonary disease, unspecified: Secondary | ICD-10-CM | POA: Diagnosis not present

## 2019-04-08 DIAGNOSIS — R531 Weakness: Secondary | ICD-10-CM | POA: Insufficient documentation

## 2019-04-08 DIAGNOSIS — R110 Nausea: Secondary | ICD-10-CM | POA: Diagnosis not present

## 2019-04-08 DIAGNOSIS — Z7189 Other specified counseling: Secondary | ICD-10-CM

## 2019-04-08 DIAGNOSIS — E1136 Type 2 diabetes mellitus with diabetic cataract: Secondary | ICD-10-CM | POA: Insufficient documentation

## 2019-04-08 LAB — CBC WITH DIFFERENTIAL/PLATELET
Abs Immature Granulocytes: 0.04 10*3/uL (ref 0.00–0.07)
Basophils Absolute: 0 10*3/uL (ref 0.0–0.1)
Basophils Relative: 1 %
Eosinophils Absolute: 0 10*3/uL (ref 0.0–0.5)
Eosinophils Relative: 1 %
HCT: 29.3 % — ABNORMAL LOW (ref 39.0–52.0)
Hemoglobin: 9.5 g/dL — ABNORMAL LOW (ref 13.0–17.0)
Immature Granulocytes: 2 %
Lymphocytes Relative: 8 %
Lymphs Abs: 0.2 10*3/uL — ABNORMAL LOW (ref 0.7–4.0)
MCH: 28.4 pg (ref 26.0–34.0)
MCHC: 32.4 g/dL (ref 30.0–36.0)
MCV: 87.7 fL (ref 80.0–100.0)
Monocytes Absolute: 0.2 10*3/uL (ref 0.1–1.0)
Monocytes Relative: 11 %
Neutro Abs: 1.7 10*3/uL (ref 1.7–7.7)
Neutrophils Relative %: 77 %
Platelets: 272 10*3/uL (ref 150–400)
RBC: 3.34 MIL/uL — ABNORMAL LOW (ref 4.22–5.81)
RDW: 16.5 % — ABNORMAL HIGH (ref 11.5–15.5)
WBC: 2.3 10*3/uL — ABNORMAL LOW (ref 4.0–10.5)
nRBC: 0 % (ref 0.0–0.2)

## 2019-04-08 LAB — COMPREHENSIVE METABOLIC PANEL
ALT: 17 U/L (ref 0–44)
AST: 28 U/L (ref 15–41)
Albumin: 2.5 g/dL — ABNORMAL LOW (ref 3.5–5.0)
Alkaline Phosphatase: 100 U/L (ref 38–126)
Anion gap: 11 (ref 5–15)
BUN: 21 mg/dL (ref 8–23)
CO2: 28 mmol/L (ref 22–32)
Calcium: 8 mg/dL — ABNORMAL LOW (ref 8.9–10.3)
Chloride: 93 mmol/L — ABNORMAL LOW (ref 98–111)
Creatinine, Ser: 2.14 mg/dL — ABNORMAL HIGH (ref 0.61–1.24)
GFR calc Af Amer: 37 mL/min — ABNORMAL LOW (ref >60)
GFR calc non Af Amer: 32 mL/min — ABNORMAL LOW (ref >60)
Glucose, Bld: 190 mg/dL — ABNORMAL HIGH (ref 70–99)
Potassium: 3.2 mmol/L — ABNORMAL LOW (ref 3.5–5.1)
Sodium: 132 mmol/L — ABNORMAL LOW (ref 135–145)
Total Bilirubin: 0.5 mg/dL (ref 0.3–1.2)
Total Protein: 6.4 g/dL — ABNORMAL LOW (ref 6.5–8.1)

## 2019-04-08 MED ORDER — PROMETHAZINE HCL 25 MG PO TABS: 25.0000 mg | ORAL_TABLET | Freq: Four times a day (QID) | ORAL | 0 refills | Status: DC | PRN

## 2019-04-08 NOTE — Progress Notes (Signed)
Hematology/Oncologyfollow up  note [facility] [phone] [phone]   Patient Care Team: Tonia Ghent, MD as PCP - General (Family Medicine) Thelma Comp, Mobile (Optometry) Telford Nab, RN as Registered Nurse  REFERRING PROVIDER: Tonia Ghent, MD  CHIEF COMPLAINTS/REASON FOR VISIT:  Follow-up for metastatic lung adenocarcinoma  HISTORY OF PRESENTING ILLNESS:   Craig Koch is a  64 y.o.  male with PMH listed below was seen in consultation at the request of  Tonia Ghent, MD  for evaluation of malignant pleural effusion. Patient was recently admitted to [facility] due to large amount of pleural effusion, acute hypoxic respiratory failure. Patient reports feeling shortness of breath and dyspnea on exertion for several weeks and presented to emergency room.  A chest x-ray 08/23/2018 showed a large amount of left pleural effusion 08/24/2018 CT chest abdomen pelvis confirmed large left pleural effusion with near complete collapse of the left lower lobe and extensive volume loss in the lingula and a portion of the left upper lobe. No acute intra-abdominal process.  Cholelithiasis, diverticulitis, coronary atherosclerosis post CABG.  Bilateral renal cortical scarring.  Aortic atherosclerosis.  #08/24/2018 ultrasound guided diagnostic and therapeutic thoracentesis removed 1.8 L of pleural fluid. Patient subsequently felt better and was discharged to follow-up with pulmonology. 09/04/2018 patient followed up with Dr.Aleskerov chest x-ray showed recurrent moderate left pleural effusion.  Patient underwent ultrasound-guided left thoracentesis again and it drained 3 L of fluid. Pleural fluid cytology positive for adenocarcinoma. Patient was referred to me for further evaluation and management.  # PET eft sided pleural hypermetabolic activity  consistent with malignant pleural effusion. Left upper and lower lobe low-level hypermetabolic him  corresponding to areas of airspace and groundglass opacity.  This was felt to be secondary to atelectasis.  Underlying left upper lobe primary bronchogenic carcinoma cannot be excluded. Medial left upper lobe area more focal hypermetabolic area could represent a pleural disease or a site of small left upper lobe primary. No evidence of extra thoracic hypermetabolic metastatic disease.  Left sided hydro-pneumothorax within the pleural air component being new since the prior CT. Cholelithiasis.  #MRI brain showed punctate focus of enhancement within the right parietal lobe cortex suspicious for possible metastasis.  Molecular study showed BRAF V600 mutation.  INTERVAL HISTORY Craig Koch is a 64 y.o. male who has above history reviewed by me today presents for follow-up of lung cancer  Problems and complaints are listed below:  Patient was accompanied by wife today. Patient was admitted from 03/03/2019-03/19/2019 due to DKA due to uncontrolled diabetes, acute decompensation of CHF left lower lobe pneumonia right pleural effusion status post thoracentesis.  Fluid study was consistent with transudate. AKI secondary to ATN, on hemodialysis Tuesday Thursday and Saturday, atrial fibrillation with RVR, paroxysmal.  Patient was discharged on Eliquis and amiodarone. Patient was readmitted from 03/26/2019-04/01/2019 due to CHF exacerbation, right pleural effusion recurrent.  Status post diagnostic and therapeutic thoracentesis.  Cytology was negative for malignant cells. Patient Reports feeling weak today.  He has nausea, no vomiting.  Not relieved by Zofran as needed.  Denies any constipation.  Occasionally he has abdominal pain.  Denies any breathing difficulties today.  Continues to have productive cough with clear sputum.  Denies any fever or chills..  No appetite.     Review of Systems  Constitutional: Positive for appetite change and fatigue. Negative for chills and fever.  HENT:   Negative for  hearing loss and voice change.   Eyes: Negative for  eye problems and icterus.  Respiratory: Positive for cough. Negative for chest tightness and shortness of breath.   Cardiovascular: Negative for chest pain.  Gastrointestinal: Positive for nausea. Negative for abdominal distention and abdominal pain.  Endocrine: Negative for hot flashes.  Genitourinary: Negative for difficulty urinating, dysuria and frequency.   Musculoskeletal: Negative for arthralgias.  Skin: Negative for itching and rash.  Neurological: Negative for light-headedness and numbness.  Hematological: Negative for adenopathy. Does not bruise/bleed easily.  Psychiatric/Behavioral: Negative for confusion.    MEDICAL HISTORY:  Past Medical History:  Diagnosis Date   Anxiety    Arthritis    CHF (congestive heart failure) ([place])    Coronary artery disease    Diabetes mellitus    Dyslipidemia    Hx of CABG    Hyperlipidemia    Hypertension    Malignant neoplasm of unspecified part of unspecified bronchus or lung ([place]) 08/2018   Immunotherapy   Pleural effusion     SURGICAL HISTORY: Past Surgical History:  Procedure Laterality Date   CATARACT EXTRACTION Left 09/2015   CHEST TUBE INSERTION Left 10/01/2018   Procedure: INSERTION PLEURAL DRAINAGE CATHETER;  Surgeon: [name], MD;  Location: [facility];  Service: Thoracic;  Laterality: Left;   CORONARY ARTERY BYPASS GRAFT  2004   (CABG with LIMA to the  LAD, SVG to OM2/OM3, SVG  to diag   DIALYSIS/PERMA CATHETER INSERTION N/A 03/14/2019   Procedure: DIALYSIS/PERMA CATHETER INSERTION;  Surgeon: Algernon Huxley, MD;  Location: [facility];  Service: Cardiovascular;  Laterality: N/A;   Left ankle surgery     repair of fracture   Right lower leg surgery     rod   TEMPORARY DIALYSIS CATHETER N/A 03/11/2019   Procedure: TEMPORARY DIALYSIS CATHETER;  Surgeon: Algernon Huxley, MD;  Location: [facility];  Service: Cardiovascular;  Laterality: N/A;    SOCIAL HISTORY: Social History   Socioeconomic History   Marital status: Married    Spouse name: Not on file   Number of children: Not on file   Years of education: Not on file   Highest education level: Not on file  Occupational History   Not on file  Tobacco Use   Smoking status: Former Smoker    Packs/day: 0.20    Years: 45.00    Pack years: 9.00    Types: Cigarettes    Quit date: 03/05/2019    Years since quitting: 0.0   Smokeless tobacco: Former Systems developer    Types: Snuff  Substance and Sexual Activity   Alcohol use: Yes    Alcohol/week: 6.0 standard drinks    Types: 6 Cans of beer per week    Comment: occ, average 6 pack in a week   Drug use: No   Sexual activity: Not Currently  Other Topics Concern   Not on file  Social History Narrative   On disability 2009 after prev injuries and CAD.     Married 1976   2 kids, 4 grandkids.    Social Determinants of Health   Financial Resource Strain:    Difficulty of Paying Living Expenses:   Food Insecurity:    Worried About Charity fundraiser in the Last Year:    Arboriculturist in the Last Year:   Transportation Needs:    Film/video editor (Medical):    Lack of Transportation (Non-Medical):   Physical Activity:    Days of Exercise per Week:    Minutes of Exercise per Session:  Stress:    Feeling of Stress :   Social Connections:    Frequency of Communication with Friends and Family:    Frequency of Social Gatherings with Friends and Family:    Attends Religious Services:    Active Member of Clubs or Organizations:    Attends Music therapist:    Marital Status:   Intimate Partner Violence:    Fear of Current or Ex-Partner:    Emotionally Abused:    Physically Abused:    Sexually Abused:      FAMILY HISTORY: Family History  Problem Relation Age of Onset   Dementia Mother    Heart disease Father    Colon cancer Neg Hx    Prostate cancer Neg Hx     Diabetes Neg Hx    ALLERGIES:  Allergies  Allergen Reactions   Lipitor [Atorvastatin Calcium] Other (See Comments)    Aches.  Tolerated crestor.      MEDICATIONS: PHYSICAL EXAMINATION: Current Outpatient Medications on File Prior to Visit  Medication Sig Dispense Refill   acetaminophen (TYLENOL) 325 MG tablet Take 2 tablets (650 mg total) by mouth every 6 (six) hours as needed for mild pain or fever.     albuterol (VENTOLIN HFA) 108 (90 Base) MCG/ACT inhaler Inhale 2 puffs into the lungs every 6 (six) hours as needed for wheezing or shortness of breath. 18 g 0   amiodarone (PACERONE) 200 MG tablet Take 1 tablet (200 mg total) by mouth daily. 30 tablet 0   clopidogrel (PLAVIX) 75 MG tablet Take 1 tablet (75 mg total) by mouth daily. 30 tablet 0   dextromethorphan-guaiFENesin (MUCINEX DM) 30-600 MG 12hr tablet Take 1 tablet by mouth 2 (two) times daily.     DULoxetine (CYMBALTA) 30 MG capsule Take 1 capsule (30 mg total) by mouth daily. 30 capsule 0   Ensure Max Protein (ENSURE MAX PROTEIN) LIQD Take 330 mLs (11 oz total) by mouth 2 (two) times daily between meals.     ferrous sulfate 325 (65 FE) MG tablet Take 1 tablet (325 mg total) by mouth 2 (two) times daily with a meal. 60 tablet 0   folic acid (FOLVITE) 1 MG tablet Take 1 tablet (1 mg total) by mouth daily.     insulin aspart (NOVOLOG) 100 UNIT/ML injection Inject 0-9 Units into the skin 3 (three) times daily with meals. Take 0 units if CBG 70-120 Take 1 unit if CBG 121-150 Take 2 units if CBG 151-200 Take 3 units if CBG 201-250 Take 5 units if CBG 251-300 Take 7 units if CBG 301-350 Take 9 units if CBG 351-400 If CBG > 400, give 12 units and call MD     insulin detemir (LEVEMIR) 100 UNIT/ML injection Inject 0.2 mLs (20 Units total) into the skin at bedtime. 10 mL 11   metoprolol succinate (TOPROL-XL) 25 MG 24 hr tablet Take 0.5 tablets (12.5 mg total) by mouth daily. 15 tablet 0   multivitamin (RENA-VIT) TABS  tablet Take 1 tablet by mouth at bedtime.  0   ondansetron (ZOFRAN) 8 MG tablet Take 1 tablet (8 mg total) by mouth every 8 (eight) hours as needed for up to 14 days for nausea or vomiting. 42 tablet 0   polyethylene glycol (MIRALAX / GLYCOLAX) 17 g packet Take 17 g by mouth daily. 14 each 0   rosuvastatin (CRESTOR) 10 MG tablet TAKE 1 TABLET (10 MG TOTAL) BY MOUTH DAILY. 90 tablet 3   simethicone (MYLICON) 80 MG chewable tablet  Chew 1 tablet (80 mg total) by mouth 4 (four) times daily as needed for flatulence. 30 tablet 0   thiamine 100 MG tablet Take 1 tablet (100 mg total) by mouth daily.     triamcinolone cream (KENALOG) 0.1 % Apply 1 application topically 2 (two) times daily. 30 g 0   dronabinol (MARINOL) 5 MG capsule Take 1 capsule (5 mg total) by mouth 2 (two) times daily before a meal. (Patient not taking: Reported on 04/08/2019) 60 capsule 0   No current facility-administered medications on file prior to visit.    ECOG PERFORMANCE STATUS:2  Today's Vitals   04/08/19 0911  BP: 110/67  Pulse: 92  Resp: 20  Temp: (!) 95.3 F (35.2 C)  SpO2: 96%  Weight: 177 lb 11.2 oz (80.6 kg)  PainSc: 0-No pain   Body mass index is 24.1 kg/m.  Physical Exam Constitutional:      Appearance: He is ill-appearing.     Comments: Patient sits in the wheelchair.  HENT:     Head: Normocephalic and atraumatic.  Eyes:     General: No scleral icterus.    Pupils: Pupils are equal, round, and reactive to light.  Cardiovascular:     Rate and Rhythm: Normal rate and regular rhythm.     Heart sounds: Normal heart sounds.  Pulmonary:     Effort: Pulmonary effort is normal. No respiratory distress.     Breath sounds: No wheezing.     Comments: Decreased breathing sounds bilateral. Abdominal:     General: Bowel sounds are normal. There is no distension.     Palpations: Abdomen is soft. There is no mass.     Tenderness: There is no abdominal tenderness.  Musculoskeletal:        General: No  deformity. Normal range of motion.     Cervical back: Normal range of motion and neck supple.  Skin:    General: Skin is warm and dry.     Findings: No erythema or rash.  Neurological:     Mental Status: He is alert and oriented to person, place, and time. Mental status is at baseline.     Cranial Nerves: No cranial nerve deficit.     Coordination: Coordination normal.  Psychiatric:        Mood and Affect: Mood normal.     LABORATORY DATA:  I have reviewed the data as listed Lab Results  Component Value Date   WBC 18.9 (H) 11/09/2018   HGB 11.0 (L) 11/09/2018   HCT 33.5 (L) 11/09/2018   MCV 89.8 11/09/2018   PLT 581 (H) 11/09/2018   Recent Labs    10/19/18 0841 11/02/18 1238 11/09/18 0833  NA 137 132* 131*  K 4.0 4.0 3.5  CL 104 95* 94*  CO2 '24 22 26  ' GLUCOSE 252* 425* 361*  BUN '14 15 12  ' CREATININE 0.61 0.84 0.77  CALCIUM 8.5* 8.5* 8.0*  GFRNONAA >60 >60 >60  GFRAA >60 >60 >60  PROT 6.0* 7.0 6.5  ALBUMIN 3.0* 2.6* 2.3*  AST 14* 11* 12*  ALT '10 10 9  ' ALKPHOS 69 75 63  BILITOT 0.5 0.9 0.8   Iron/TIBC/Ferritin/ %Sat No results found for: IRON, TIBC, FERRITIN, IRONPCTSAT    RADIOGRAPHIC STUDIES: I have personally reviewed the radiological images as listed and agreed with the findings in the report.   DG Chest 1 View  Result Date: 03/14/2019 CLINICAL DATA:  Wheezing EXAM: CHEST  1 VIEW COMPARISON:  03/03/2019 FINDINGS: Borderline heart size.  CABG. Diffuse interstitial opacity with Kerley lines and left more than right pleural effusion. No pneumothorax. IMPRESSION: CHF. Electronically Signed   By: Monte Fantasia M.D.   On: 03/14/2019 05:47   DG Chest 2 View  Result Date: 03/28/2019 CLINICAL DATA:  End-stage renal disease EXAM: CHEST - 2 VIEW COMPARISON:  03/26/2019 FINDINGS: Cardiac shadow is mildly enlarged but stable. Postsurgical changes and dialysis catheter are again seen. Bilateral pleural effusions are seen stable in appearance from the prior exam. Mild  vascular congestion is again noted but slightly improved. No focal confluent infiltrate is seen. IMPRESSION: Slight improvement in the degree of vascular congestion. Bilateral pleural effusions stable from the prior exam. Electronically Signed   By: Inez Catalina M.D.   On: 03/28/2019 14:22   DG Chest 2 View  Result Date: 03/26/2019 CLINICAL DATA:  Shortness of breath EXAM: CHEST - 2 VIEW COMPARISON:  March 15, 2019 FINDINGS: Heart is mildly enlarged with pulmonary venous hypertension. There is a pleural effusion on the left. There is consolidation in the medial left base. There is mild interstitial edema present with atelectatic change in the left mid and lower lung zones. Patient is status post coronary artery bypass grafting. Central catheter tip is in the superior vena cava. No pneumothorax. No bone lesions. IMPRESSION: Cardiomegaly with pulmonary vascular congestion. Left pleural effusion. There is a degree of interstitial edema. Suspect a degree of congestive heart failure. Consolidation in the medial left base may represent alveolar edema or pneumonia. Both entities may be present concurrently. Central catheter tip is in the superior vena cava. Patient is status post coronary artery bypass grafting. Electronically Signed   By: Lowella Grip III M.D.   On: 03/26/2019 16:56   CT HEAD WO CONTRAST  Result Date: 03/05/2019 CLINICAL DATA:  Metastatic disease evaluation.  Lung cancer. EXAM: CT HEAD WITHOUT CONTRAST TECHNIQUE: Contiguous axial images were obtained from the base of the skull through the vertex without intravenous contrast. COMPARISON:  MRI head 09/20/2018 FINDINGS: Brain: Mild atrophy. Mild hypodensity in the white matter unchanged from the prior MRI. No acute infarct, hemorrhage, mass. No edema or midline shift. Vascular: Negative for hyperdense vessel Skull: Negative skull. Sinuses/Orbits: Mucosal edema and bony thickening right maxillary sinus. Chronic nasal bone fracture. Left cataract  extraction. Other: None IMPRESSION: No acute abnormality and negative for metastatic disease on unenhanced CT. Atrophy and mild chronic microvascular ischemia. Electronically Signed   By: Franchot Gallo M.D.   On: 03/05/2019 15:28   CT CHEST WO CONTRAST  Result Date: 03/14/2019 CLINICAL DATA:  Respiratory failure. EXAM: CT CHEST WITHOUT CONTRAST TECHNIQUE: Multidetector CT imaging of the chest was performed following the standard protocol without IV contrast. COMPARISON:  Chest CT dated 01/22/2019. FINDINGS: Cardiovascular: Heart size appears stable. No pericardial effusion. Diffuse coronary artery calcifications. Surgical changes of median sternotomy for presumed CABG. Mediastinum/Nodes: No mass or enlarged lymph nodes seen within the mediastinum. Esophagus appears normal. Trachea is unremarkable. Lungs/Pleura: New RIGHT pleural effusion, moderate to large in size. Associated compressive atelectasis within the RIGHT perihilar and lower lobe. New ill-defined consolidation within the LEFT lower lobe, suspected pneumonia. Probable additional rounded atelectasis within the lingula. Diffuse interstitial thickening indicating some degree of volume overload versus atypical infection. Upper Abdomen: Limited images of the upper abdomen are unremarkable. Musculoskeletal: No acute or suspicious osseous finding. IMPRESSION: 1. New ill-defined consolidation within the LEFT lower lobe, suspected pneumonia. 2. New moderate to large-sized RIGHT pleural effusion, with associated compressive atelectasis. 3. Diffuse interstitial thickening, most  likely edema/fluid overload, less likely additional atypical infection. 4. Probable additional rounded atelectasis within the lingula. Aortic Atherosclerosis (ICD10-I70.0). Electronically Signed   By: Franki Cabot M.D.   On: 03/14/2019 19:47   CT Chest W Contrast  Result Date: 01/22/2019 CLINICAL DATA:  Restaging lung cancer, immunotherapy EXAM: CT CHEST, ABDOMEN, AND PELVIS WITH  CONTRAST TECHNIQUE: Multidetector CT imaging of the chest, abdomen and pelvis was performed following the standard protocol during bolus administration of intravenous contrast. CONTRAST:  141m OMNIPAQUE IOHEXOL 300 MG/ML SOLN, additional oral enteric contrast COMPARISON:  CT chest angiogram, 11/02/2018, PET-CT, 09/18/2018 FINDINGS: CT CHEST FINDINGS Cardiovascular: Aortic atherosclerosis. Normal heart size. Extensive 3 vessel coronary artery calcifications and/or stents. No pericardial effusion. Mediastinum/Nodes: No enlarged mediastinal, hilar, or axillary lymph nodes. Redemonstrated subcentimeter hypodense nodule of the left lobe of the thyroid. Trachea, and esophagus demonstrate no significant findings. Lungs/Pleura: There has been a substantial interval decrease in rinded pleural thickening and interlobular septal thickening about the left lung, with mild, persistent pleural thickening throughout and a small, loculated left pleural effusion. A previously seen left-sided tunneled pleural drainage catheter has been removed. Musculoskeletal: No chest wall mass or suspicious bone lesions identified. CT ABDOMEN PELVIS FINDINGS Hepatobiliary: No solid liver abnormality is seen. Small gallstone in the gallbladder. No gallbladder wall thickening, or biliary dilatation. Pancreas: Unremarkable. No pancreatic ductal dilatation or surrounding inflammatory changes. Spleen: Normal in size without significant abnormality. Adrenals/Urinary Tract: Adrenal glands are unremarkable. Kidneys are normal, without renal calculi, solid lesion, or hydronephrosis. Bladder is unremarkable. Stomach/Bowel: Stomach is within normal limits. Appendix appears normal. No evidence of bowel wall thickening, distention, or inflammatory changes. Vascular/Lymphatic: Severe mixed aortic atherosclerosis. No enlarged abdominal or pelvic lymph nodes. Reproductive: No mass or other abnormality. Other: No abdominal wall hernia or abnormality. No  abdominopelvic ascites. Musculoskeletal: No acute or significant osseous findings. IMPRESSION: 1. Significant interval decrease in rinded pleural thickening and interlobular septal thickening about the left lung, with mild, persistent pleural thickening throughout and a small, loculated left pleural effusion. Findings are consistent with significant treatment response of pleural metastatic disease, particularly when compared to most recent CT angiogram dated 11/02/2018. 2. A previously seen left-sided tunneled pleural drainage catheter has been removed. 3. No evidence of metastatic disease in the abdomen or pelvis. 4. Cholelithiasis. 5. Coronary artery disease.  Aortic Atherosclerosis (ICD10-I70.0). Electronically Signed   By: AEddie CandleM.D.   On: 01/22/2019 10:41   CT Abdomen Pelvis W Contrast  Result Date: 01/22/2019 CLINICAL DATA:  Restaging lung cancer, immunotherapy EXAM: CT CHEST, ABDOMEN, AND PELVIS WITH CONTRAST TECHNIQUE: Multidetector CT imaging of the chest, abdomen and pelvis was performed following the standard protocol during bolus administration of intravenous contrast. CONTRAST:  1063mOMNIPAQUE IOHEXOL 300 MG/ML SOLN, additional oral enteric contrast COMPARISON:  CT chest angiogram, 11/02/2018, PET-CT, 09/18/2018 FINDINGS: CT CHEST FINDINGS Cardiovascular: Aortic atherosclerosis. Normal heart size. Extensive 3 vessel coronary artery calcifications and/or stents. No pericardial effusion. Mediastinum/Nodes: No enlarged mediastinal, hilar, or axillary lymph nodes. Redemonstrated subcentimeter hypodense nodule of the left lobe of the thyroid. Trachea, and esophagus demonstrate no significant findings. Lungs/Pleura: There has been a substantial interval decrease in rinded pleural thickening and interlobular septal thickening about the left lung, with mild, persistent pleural thickening throughout and a small, loculated left pleural effusion. A previously seen left-sided tunneled pleural drainage  catheter has been removed. Musculoskeletal: No chest wall mass or suspicious bone lesions identified. CT ABDOMEN PELVIS FINDINGS Hepatobiliary: No solid liver abnormality is seen. Small gallstone in  the gallbladder. No gallbladder wall thickening, or biliary dilatation. Pancreas: Unremarkable. No pancreatic ductal dilatation or surrounding inflammatory changes. Spleen: Normal in size without significant abnormality. Adrenals/Urinary Tract: Adrenal glands are unremarkable. Kidneys are normal, without renal calculi, solid lesion, or hydronephrosis. Bladder is unremarkable. Stomach/Bowel: Stomach is within normal limits. Appendix appears normal. No evidence of bowel wall thickening, distention, or inflammatory changes. Vascular/Lymphatic: Severe mixed aortic atherosclerosis. No enlarged abdominal or pelvic lymph nodes. Reproductive: No mass or other abnormality. Other: No abdominal wall hernia or abnormality. No abdominopelvic ascites. Musculoskeletal: No acute or significant osseous findings. IMPRESSION: 1. Significant interval decrease in rinded pleural thickening and interlobular septal thickening about the left lung, with mild, persistent pleural thickening throughout and a small, loculated left pleural effusion. Findings are consistent with significant treatment response of pleural metastatic disease, particularly when compared to most recent CT angiogram dated 11/02/2018. 2. A previously seen left-sided tunneled pleural drainage catheter has been removed. 3. No evidence of metastatic disease in the abdomen or pelvis. 4. Cholelithiasis. 5. Coronary artery disease.  Aortic Atherosclerosis (ICD10-I70.0). Electronically Signed   By: Eddie Candle M.D.   On: 01/22/2019 10:41   US RENAL  Result Date: 03/10/2019 CLINICAL DATA:  64 year old presenting with acute renal failure. Evaluate for obstruction as a possible cause. EXAM: RENAL / URINARY TRACT ULTRASOUND COMPLETE COMPARISON:  Urinary tract ultrasound 03/04/2019.  CT abdomen and pelvis 01/22/2019. FINDINGS: Right Kidney: Renal measurements: Approximately 12.6 x 6.4 x 6.2 cm = volume: 264 mL. Mildly echogenic parenchyma. No hydronephrosis. Well-preserved cortex. No shadowing calculi. No focal parenchymal abnormality. Left Kidney: Renal measurements: Approximately 12.5 x 6.3 x 6.9 cm = volume: 283 mL. Mildly echogenic parenchyma. No hydronephrosis. Well-preserved cortex. No shadowing calculi. No focal parenchymal abnormality. Bladder: Normal for degree of bladder distention. Other: None. IMPRESSION: 1. Mildly echogenic renal parenchyma indicative of medical renal disease. 2. Otherwise normal examination. Specifically, no evidence of urinary tract obstruction to account for the acute renal insufficiency. Electronically Signed   By: Evangeline Dakin M.D.   On: 03/10/2019 15:02   US RENAL  Result Date: 03/04/2019 CLINICAL DATA:  Acute kidney injury EXAM: RENAL / URINARY TRACT ULTRASOUND COMPLETE COMPARISON:  CT 01/22/2019 FINDINGS: Right Kidney: Renal measurements: 11.4 x 6 x 6 cm = volume: 215 mL . Echogenicity within normal limits. No mass or hydronephrosis visualized. Left Kidney: Renal measurements: 11.8 x 7.3 x 6.5 cm = volume: 291.8 mL. Echogenicity within normal limits. No mass or hydronephrosis visualized. Bladder: Appears normal for degree of bladder distention. Other: Incidental note made of gallstones and small effusions. Heterogenous prostate. IMPRESSION: Normal ultrasound appearance of the kidneys Electronically Signed   By: Donavan Foil M.D.   On: 03/04/2019 15:37   PERIPHERAL VASCULAR CATHETERIZATION  Result Date: 03/14/2019 See op note  PERIPHERAL VASCULAR CATHETERIZATION  Result Date: 03/11/2019 See op note  US Venous Img Lower Bilateral  Result Date: 01/29/2019 CLINICAL DATA:  Bilateral lower extremity edema. History of pulmonary embolism and lung cancer. Evaluate for DVT. EXAM: BILATERAL LOWER EXTREMITY VENOUS DOPPLER ULTRASOUND TECHNIQUE:  Gray-scale sonography with graded compression, as well as color Doppler and duplex ultrasound were performed to evaluate the lower extremity deep venous systems from the level of the common femoral vein and including the common femoral, femoral, profunda femoral, popliteal and calf veins including the posterior tibial, peroneal and gastrocnemius veins when visible. The superficial great saphenous vein was also interrogated. Spectral Doppler was utilized to evaluate flow at rest and with distal augmentation maneuvers in the  common femoral, femoral and popliteal veins. COMPARISON:  None. FINDINGS: RIGHT LOWER EXTREMITY Common Femoral Vein: No evidence of thrombus. Normal compressibility, respiratory phasicity and response to augmentation. Saphenofemoral Junction: No evidence of thrombus. Normal compressibility and flow on color Doppler imaging. Profunda Femoral Vein: No evidence of thrombus. Normal compressibility and flow on color Doppler imaging. Femoral Vein: No evidence of thrombus. Normal compressibility, respiratory phasicity and response to augmentation. Popliteal Vein: No evidence of thrombus. Normal compressibility, respiratory phasicity and response to augmentation. Calf Veins: No evidence of thrombus. Normal compressibility and flow on color Doppler imaging. Superficial Great Saphenous Vein: No evidence of thrombus. Normal compressibility. Venous Reflux:  None. Other Findings:  None. LEFT LOWER EXTREMITY Common Femoral Vein: No evidence of thrombus. Normal compressibility, respiratory phasicity and response to augmentation. Saphenofemoral Junction: No evidence of thrombus. Normal compressibility and flow on color Doppler imaging. Profunda Femoral Vein: No evidence of thrombus. Normal compressibility and flow on color Doppler imaging. Femoral Vein: No evidence of thrombus. Normal compressibility, respiratory phasicity and response to augmentation. Popliteal Vein: No evidence of thrombus. Normal  compressibility, respiratory phasicity and response to augmentation. Calf Veins: No evidence of thrombus. Normal compressibility and flow on color Doppler imaging. Superficial Great Saphenous Vein: No evidence of thrombus. Normal compressibility. Venous Reflux:  None. Other Findings:  None. IMPRESSION: No evidence of DVT within either lower extremity. Electronically Signed   By: Sandi Mariscal M.D.   On: 01/29/2019 16:09   DG Chest Port 1 View  Result Date: 03/29/2019 CLINICAL DATA:  Post thoracentesis. EXAM: PORTABLE CHEST 1 VIEW COMPARISON:  03/28/2019. FINDINGS: Post thoracentesis chest x-ray reveals no evidence of pneumothorax. Significant reduction in right-sided pleural effusion. Small left pleural effusion again noted. Bibasilar atelectasis/infiltrates again noted. Prior CABG. Right IJ line stable position. IMPRESSION: Post thoracentesis chest x-ray reveals no evidence of pneumothorax. Significant reduction in right-sided pleural effusion. Electronically Signed   By: Marcello Moores  Register   On: 03/29/2019 15:01   DG Chest Port 1 View  Result Date: 03/15/2019 CLINICAL DATA:  Status post thoracentesis. EXAM: PORTABLE CHEST 1 VIEW COMPARISON:  03/14/2019 FINDINGS: Interval decrease right pleural effusion. No evidence for pneumothorax. Bibasilar atelectasis/infiltrate again noted with persistent small left pleural effusion. The cardiopericardial silhouette is within normal limits for size. Pulmonary vascular congestion has decreased in the interval. Right IJ central line tip overlies the mid to distal SVC. The visualized bony structures of the thorax are intact. Telemetry leads overlie the chest. IMPRESSION: Interval decrease in right pleural effusion without evidence of pneumothorax. Decrease in pulmonary vascular congestion with persistent bibasilar atelectasis/infiltrate and small left pleural effusion. Electronically Signed   By: Misty Stanley M.D.   On: 03/15/2019 11:36   DG Chest Portable 1 View  Result  Date: 03/03/2019 CLINICAL DATA:  Shortness of breath, since Monday worsening this morning around 1 a.m. EXAM: PORTABLE CHEST 1 VIEW COMPARISON:  12/19/2018 FINDINGS: Cardiomediastinal contours are stable following median sternotomy with persistent left-sided pleural effusion and pleural-parenchymal scarring. No new areas of consolidation or evidence of right-sided pleural effusion. No signs of acute bone process. IMPRESSION: Persistent left-sided pleural effusion and pleural-parenchymal scarring. Electronically Signed   By: Zetta Bills M.D.   On: 03/03/2019 13:59   ECHOCARDIOGRAM COMPLETE  Result Date: 03/15/2019    ECHOCARDIOGRAM REPORT   Patient Name:   Craig Koch Date of Exam: 03/15/2019 Medical Rec #:  [id number]       Height:       72.0 in Accession #:  [id number]      Weight:       201.4 lb Date of Birth:  May 12, 1955       BSA:          2.137 m Patient Age:    82 years        BP:           125/69 mmHg Patient Gender: M               HR:           83 bpm. Exam Location:  [facility] Procedure: 2D Echo, Cardiac Doppler and Color Doppler Indications:     Respiratory distress  History:         Patient has prior history of Echocardiogram examinations, most                  recent 03/04/2019. Prior CABG; Risk Factors:Hypertension and                  Diabetes.  Sonographer:     Sherrie Sport RDCS (AE) Referring Phys:  [id number] Claiborne Billings A GRIFFITH Diagnosing Phys: Kathlyn Sacramento MD IMPRESSIONS  1. Left ventricular ejection fraction, by estimation, is 35 to 40%. The left ventricle has moderately decreased function. The left ventricle demonstrates regional wall motion abnormalities . The left ventricular internal cavity size was mildly dilated. There is mild left ventricular hypertrophy. Left ventricular diastolic parameters are consistent with Grade II diastolic dysfunction (pseudonormalization). There is moderate hypokinesis of the left ventricular, entire inferior wall.  2. Right ventricular systolic function is normal.  The right ventricular size is normal. There is moderately elevated pulmonary artery systolic pressure. The estimated right ventricular systolic pressure is 52.7 mmHg.  3. Left atrial size was mildly dilated.  4. The mitral valve is normal in structure. Moderate mitral valve regurgitation. No evidence of mitral stenosis.  5. The aortic valve is abnormal. Aortic valve regurgitation is mild. Mild to moderate aortic valve sclerosis/calcification is present, without any evidence of aortic stenosis.  6. The inferior vena cava is normal in size with greater than 50% respiratory variability, suggesting right atrial pressure of 3 mmHg. FINDINGS  Left Ventricle: Left ventricular ejection fraction, by estimation, is 35 to 40%. The left ventricle has moderately decreased function. The left ventricle demonstrates regional wall motion abnormalities. Moderate hypokinesis of the left ventricular, entire inferior wall. The left ventricular internal cavity size was mildly dilated. There is mild left ventricular hypertrophy. Left ventricular diastolic parameters are consistent with Grade II diastolic dysfunction (pseudonormalization). Right Ventricle: The right ventricular size is normal. No increase in right ventricular wall thickness. Right ventricular systolic function is normal. There is moderately elevated pulmonary artery systolic pressure. The tricuspid regurgitant velocity is 3.37 m/s, and with an assumed right atrial pressure of 3 mmHg, the estimated right ventricular systolic pressure is 78.2 mmHg. Left Atrium: Left atrial size was mildly dilated. Right Atrium: Right atrial size was normal in size. Pericardium: There is no evidence of pericardial effusion. Mitral Valve: The mitral valve is normal in structure. Normal mobility of the mitral valve leaflets. Moderate mitral valve regurgitation. No evidence of mitral valve stenosis. Tricuspid Valve: The tricuspid valve is normal in structure. Tricuspid valve regurgitation is  mild . No evidence of tricuspid stenosis. Aortic Valve: The aortic valve is abnormal. Aortic valve regurgitation is mild. Mild to moderate aortic valve sclerosis/calcification is present, without any evidence of aortic stenosis. Aortic valve mean gradient measures 4.5 mmHg. Aortic valve peak gradient measures  8.4 mmHg. Aortic valve area, by VTI measures 2.54 cm. Pulmonic Valve: The pulmonic valve was normal in structure. Pulmonic valve regurgitation is not visualized. No evidence of pulmonic stenosis. Aorta: The aortic root is normal in size and structure. Venous: The inferior vena cava is normal in size with greater than 50% respiratory variability, suggesting right atrial pressure of 3 mmHg. IAS/Shunts: No atrial level shunt detected by color flow Doppler.  LEFT VENTRICLE PLAX 2D LVIDd:         5.34 cm      Diastology LVIDs:         4.38 cm      LV e' lateral:   5.77 cm/s LV PW:         1.00 cm      LV E/e' lateral: 22.4 LV IVS:        1.20 cm      LV e' medial:    5.00 cm/s LVOT diam:     2.00 cm      LV E/e' medial:  25.8 LV SV:         63 LV SV Index:   30 LVOT Area:     3.14 cm  LV Volumes (MOD) LV vol d, MOD A2C: 227.0 ml LV vol d, MOD A4C: 148.0 ml LV vol s, MOD A2C: 142.0 ml LV vol s, MOD A4C: 110.0 ml LV SV MOD A2C:     85.0 ml LV SV MOD A4C:     148.0 ml LV SV MOD BP:      59.1 ml RIGHT VENTRICLE RV Basal diam:  4.14 cm RV S prime:     10.30 cm/s TAPSE (M-mode): 3.2 cm LEFT ATRIUM             Index       RIGHT ATRIUM           Index LA diam:        4.80 cm 2.25 cm/m  RA Area:     15.70 cm LA Vol (A2C):   68.8 ml 32.20 ml/m RA Volume:   40.00 ml  18.72 ml/m LA Vol (A4C):   77.9 ml 36.45 ml/m LA Biplane Vol: 75.1 ml 35.14 ml/m  AORTIC VALVE                   PULMONIC VALVE AV Area (Vmax):    2.41 cm    PV Vmax:        0.99 m/s AV Area (Vmean):   2.15 cm    PV Peak grad:   3.9 mmHg AV Area (VTI):     2.54 cm    RVOT Peak grad: 4 mmHg AV Vmax:           144.50 cm/s AV Vmean:          94.950 cm/s AV  VTI:            0.249 m AV Peak Grad:      8.4 mmHg AV Mean Grad:      4.5 mmHg LVOT Vmax:         111.00 cm/s LVOT Vmean:        65.100 cm/s LVOT VTI:          0.201 m LVOT/AV VTI ratio: 0.81  AORTA Ao Root diam: 2.90 cm MITRAL VALVE                TRICUSPID VALVE MV Area (PHT): 5.13 cm     TR Peak grad:  45.4 mmHg MV Decel Time: 148 msec     TR Vmax:        337.00 cm/s MV E velocity: 129.00 cm/s MV A velocity: 68.40 cm/s   SHUNTS MV E/A ratio:  1.89         Systemic VTI:  0.20 m                             Systemic Diam: 2.00 cm Kathlyn Sacramento MD Electronically signed by Kathlyn Sacramento MD Signature Date/Time: 03/15/2019/11:27:06 AM    Final    ECHOCARDIOGRAM COMPLETE  Result Date: 03/04/2019    ECHOCARDIOGRAM REPORT   Patient Name:   Craig Koch Date of Exam: 03/04/2019 Medical Rec #:  [id number]       Height:       72.0 in Accession #:    [id number]      Weight:       185.0 lb Date of Birth:  03-30-1955       BSA:          2.061 m Patient Age:    45 years        BP:           125/72 mmHg Patient Gender: M               HR:           119 bpm. Exam Location:  [facility] Procedure: 2D Echo, Color Doppler and Cardiac Doppler Indications:     I21.4 NSTEMI  History:         Patient has prior history of Echocardiogram examinations, most                  recent 08/24/2018. CAD, Prior CABG; Risk Factors:Hypertension,                  Dyslipidemia and Diabetes. Lung Cancer.  Sonographer:     Charmayne Sheer RDCS (AE) Referring Phys:  [place] Phys: Kathlyn Sacramento MD IMPRESSIONS  1. Left ventricular ejection fraction, by estimation, is 30 to 35%. The left ventricle has moderately decreased function. The left ventricle demonstrates global hypokinesis. Left ventricular diastolic parameters are indeterminate.  2. Right ventricular systolic function is normal. The right ventricular size is normal. Tricuspid regurgitation signal is inadequate for assessing PA pressure.  3. Left atrial size was mild to moderately  dilated.  4. The mitral valve is normal in structure and function. Moderate mitral valve regurgitation. No evidence of mitral stenosis.  5. The aortic valve is normal in structure and function. Aortic valve regurgitation is trivial. Mild to moderate aortic valve sclerosis/calcification is present, without any evidence of aortic stenosis.  6. The inferior vena cava is normal in size with greater than 50% respiratory variability, suggesting right atrial pressure of 3 mmHg. FINDINGS  Left Ventricle: Left ventricular ejection fraction, by estimation, is 30 to 35%. The left ventricle has moderately decreased function. The left ventricle demonstrates global hypokinesis. The left ventricular internal cavity size was normal in size. There is no left ventricular hypertrophy. Left ventricular diastolic parameters are indeterminate. Right Ventricle: The right ventricular size is normal. No increase in right ventricular wall thickness. Right ventricular systolic function is normal. Tricuspid regurgitation signal is inadequate for assessing PA pressure. Left Atrium: Left atrial size was mild to moderately dilated. Right Atrium: Right atrial size was normal in size. Pericardium: There is no evidence of pericardial effusion. Mitral  Valve: The mitral valve is normal in structure and function. Normal mobility of the mitral valve leaflets. Moderate mitral valve regurgitation. No evidence of mitral valve stenosis. MV peak gradient, 7.5 mmHg. The mean mitral valve gradient is 4.0  mmHg. Tricuspid Valve: The tricuspid valve is normal in structure. Tricuspid valve regurgitation is not demonstrated. No evidence of tricuspid stenosis. Aortic Valve: The aortic valve is normal in structure and function. Aortic valve regurgitation is trivial. Aortic regurgitation PHT measures 281 msec. Mild to moderate aortic valve sclerosis/calcification is present, without any evidence of aortic stenosis. Aortic valve mean gradient measures 5.0 mmHg. Aortic  valve peak gradient measures 8.5 mmHg. Aortic valve area, by VTI measures 2.53 cm. Pulmonic Valve: The pulmonic valve was normal in structure. Pulmonic valve regurgitation is not visualized. No evidence of pulmonic stenosis. Aorta: The aortic root is normal in size and structure. Venous: The inferior vena cava is normal in size with greater than 50% respiratory variability, suggesting right atrial pressure of 3 mmHg. IAS/Shunts: No atrial level shunt detected by color flow Doppler.  LEFT VENTRICLE PLAX 2D LVIDd:         5.21 cm  Diastology LVIDs:         4.21 cm  LV e' lateral:   7.83 cm/s LV PW:         0.84 cm  LV E/e' lateral: 13.4 LV IVS:        0.89 cm  LV e' medial:    7.94 cm/s LVOT diam:     2.40 cm  LV E/e' medial:  13.2 LV SV:         54 LV SV Index:   26 LVOT Area:     4.52 cm  RIGHT VENTRICLE RV Basal diam:  3.08 cm LEFT ATRIUM             Index       RIGHT ATRIUM           Index LA diam:        4.70 cm 2.28 cm/m  RA Area:     14.10 cm LA Vol (A2C):   62.0 ml 30.08 ml/m RA Volume:   35.20 ml  17.08 ml/m LA Vol (A4C):   68.6 ml 33.29 ml/m LA Biplane Vol: 65.3 ml 31.68 ml/m  AORTIC VALVE                    PULMONIC VALVE AV Area (Vmax):    2.95 cm     PV Vmax:       1.10 m/s AV Area (Vmean):   2.86 cm     PV Vmean:      68.400 cm/s AV Area (VTI):     2.53 cm     PV VTI:        0.146 m AV Vmax:           146.00 cm/s  PV Peak grad:  4.8 mmHg AV Vmean:          103.000 cm/s PV Mean grad:  2.0 mmHg AV VTI:            0.213 m AV Peak Grad:      8.5 mmHg AV Mean Grad:      5.0 mmHg LVOT Vmax:         95.30 cm/s LVOT Vmean:        65.200 cm/s LVOT VTI:          0.119 m LVOT/AV VTI ratio: 0.56 AI  PHT:            281 msec  AORTA Ao Root diam: 3.00 cm MITRAL VALVE MV Area (PHT): 3.92 cm     SHUNTS MV Peak grad:  7.5 mmHg     Systemic VTI:  0.12 m MV Mean grad:  4.0 mmHg     Systemic Diam: 2.40 cm MV Vmax:       1.37 m/s MV Vmean:      92.7 cm/s MV Decel Time: 194 msec MV E velocity: 104.80 cm/s Kathlyn Sacramento MD Electronically signed by Kathlyn Sacramento MD Signature Date/Time: 03/04/2019/2:46:43 PM    Final    [place] RT UPPER EXTREM LTD SOFT TISSUE NON VASCULAR  Result Date: 03/08/2019 CLINICAL DATA:  Swelling and bruising of the right upper extremity. Possible hematoma. EXAM: ULTRASOUND right UPPER EXTREMITY LIMITED TECHNIQUE: Ultrasound examination of the upper extremity soft tissues was performed in the area of clinical concern. COMPARISON:  None. FINDINGS: Diffuse subcutaneous soft tissue swelling/edema and streaky fluid. No discrete fluid collection or hematoma. The underlying musculature is grossly normal. IMPRESSION: Diffuse subcutaneous soft tissue swelling/edema/streaky fluid but no discrete abscess or hematoma. Electronically Signed   By: Marijo Sanes M.D.   On: 03/08/2019 19:13   US THORACENTESIS ASP PLEURAL SPACE W/IMG GUIDE  Result Date: 03/29/2019 INDICATION: Pleural effusion. EXAM: ULTRASOUND GUIDED RIGHT THORACENTESIS MEDICATIONS: None. COMPLICATIONS: None immediate. PROCEDURE: An ultrasound guided thoracentesis was thoroughly discussed with the patient and questions answered. The benefits, risks, alternatives and complications were also discussed. The patient understands and wishes to proceed with the procedure. Written consent was obtained. Ultrasound was performed to localize and mark an adequate pocket of fluid in the right chest. The area was then prepped and draped in the normal sterile fashion. 1% Lidocaine was used for local anesthesia. Under ultrasound guidance a 6 French catheter was introduced. Thoracentesis was performed. The catheter was removed and a dressing applied. FINDINGS: A total of approximately 1 L of yellow fluid was removed. Samples were sent to the laboratory as requested by the clinical team. IMPRESSION: Successful ultrasound guided right thoracentesis yielding 1 L of pleural fluid. Electronically Signed   By: Marcello Moores  Register   On: 03/29/2019 15:04   US THORACENTESIS ASP  PLEURAL SPACE W/IMG GUIDE  Result Date: 03/15/2019 INDICATION: History of lung cancer with recurrent right pleural effusion. Request for diagnostic and therapeutic thoracentesis. EXAM: ULTRASOUND GUIDED RIGHT THORACENTESIS MEDICATIONS: 1% lidocaine 10 mL COMPLICATIONS: None immediate. PROCEDURE: An ultrasound guided thoracentesis was thoroughly discussed with the patient and questions answered. The benefits, risks, alternatives and complications were also discussed. The patient understands and wishes to proceed with the procedure. Written consent was obtained. Ultrasound was performed to localize and mark an adequate pocket of fluid in the right chest. The area was then prepped and draped in the normal sterile fashion. 1% Lidocaine was used for local anesthesia. Under ultrasound guidance a 6 Fr Safe-T-Centesis catheter was introduced. Thoracentesis was performed. The catheter was removed and a dressing applied. FINDINGS: A total of approximately 2.7 L of clear yellow fluid was removed. Samples were sent to the laboratory as requested by the clinical team. IMPRESSION: Successful ultrasound guided right thoracentesis yielding 2.7 L of pleural fluid. No pneumothorax on post-procedure chest x-ray. Read by: Gareth Eagle, PA-C Electronically Signed   By: Sandi Mariscal M.D.   On: 03/15/2019 12:44     ASSESSMENT & PLAN:  1. Malignant neoplasm of unspecified part of unspecified bronchus or lung ([place])   2.  Nausea without vomiting   3. 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 BRAF targeted therapy was discontinued due to patient's recent acute illness including acute CHF, DKA, renal failure on hemodialysis.  I had a lengthy discussion with patient today that due to the potential cardiac toxicities from BRAF targeted therapies, I would not resume this regimen and to his cardiac function  resumed to baseline. Patient's current condition would not allow chemotherapy.  He is also not interested in chemotherapy.  May consider single agent Keytruda.  PD-L1 8%.  Response rate may be low due to PD-L1 less than 50% Today he is still weak I will hold off Keytruda treatments.  Nausea without vomiting, will start Phenergan 25 mg every 6 hours as needed.  Prescription sent to pharmacy. Goal of care was discussed with patient.  His prognosis is poor given his multiple comorbidities.  I discussed the option of proceeding with comfort care/hospice versus weekly assessment to see if he may get better enough to receive immunotherapy.  Patient prefers to wait and see if he is able to improve and try immunotherapy. Return visit, 1 week.  Also discussed with palliative care.  Patient will have an appointment with Altha Harm next week.  Earlie Server, MD, PhD Hematology Oncology [place] [facility] 04/08/2019

## 2019-04-08 NOTE — Progress Notes (Signed)
Patient had a recent hospitalization and PT stay.  He has started dialysis on Tu, Th, Sat.  Having constant nausea that is not relieved with Ondansetron.  Also concerned with a productive cough that is worse at night.

## 2019-04-08 NOTE — Telephone Encounter (Signed)
Attempted to schedule no ans no vm  

## 2019-04-09 ENCOUNTER — Inpatient Hospital Stay: Payer: Medicare Other | Admitting: Family Medicine

## 2019-04-09 DIAGNOSIS — N184 Chronic kidney disease, stage 4 (severe): Secondary | ICD-10-CM | POA: Diagnosis not present

## 2019-04-09 DIAGNOSIS — N179 Acute kidney failure, unspecified: Secondary | ICD-10-CM | POA: Diagnosis not present

## 2019-04-09 DIAGNOSIS — N189 Chronic kidney disease, unspecified: Secondary | ICD-10-CM | POA: Diagnosis not present

## 2019-04-10 NOTE — Progress Notes (Signed)
Pharmacist Chemotherapy Monitoring - Follow Up Assessment    I verify that I have reviewed each item in the below checklist:  . Regimen for the patient is scheduled for the appropriate day and plan matches scheduled date. [name] Appropriate non-routine labs are ordered dependent on drug ordered. . If applicable, additional medications reviewed and ordered per protocol based on lifetime cumulative doses and/or treatment regimen.   Plan for follow-up and/or issues identified: No . I-vent associated with next due treatment: No . MD and/or nursing notified: No  Craig Koch K 04/10/2019 8:28 AM

## 2019-04-11 DIAGNOSIS — N189 Chronic kidney disease, unspecified: Secondary | ICD-10-CM | POA: Diagnosis not present

## 2019-04-11 DIAGNOSIS — N184 Chronic kidney disease, stage 4 (severe): Secondary | ICD-10-CM | POA: Diagnosis not present

## 2019-04-11 DIAGNOSIS — N179 Acute kidney failure, unspecified: Secondary | ICD-10-CM | POA: Diagnosis not present

## 2019-04-12 ENCOUNTER — Encounter: Payer: Self-pay | Admitting: Family Medicine

## 2019-04-12 ENCOUNTER — Ambulatory Visit (INDEPENDENT_AMBULATORY_CARE_PROVIDER_SITE_OTHER): Payer: Medicare Other | Admitting: Family Medicine

## 2019-04-12 ENCOUNTER — Other Ambulatory Visit: Payer: Self-pay

## 2019-04-12 DIAGNOSIS — E43 Unspecified severe protein-calorie malnutrition: Secondary | ICD-10-CM | POA: Diagnosis not present

## 2019-04-12 DIAGNOSIS — N170 Acute kidney failure with tubular necrosis: Secondary | ICD-10-CM

## 2019-04-12 DIAGNOSIS — C3490 Malignant neoplasm of unspecified part of unspecified bronchus or lung: Secondary | ICD-10-CM

## 2019-04-12 DIAGNOSIS — E118 Type 2 diabetes mellitus with unspecified complications: Secondary | ICD-10-CM | POA: Diagnosis not present

## 2019-04-12 MED ORDER — INSULIN DETEMIR 100 UNIT/ML ~~LOC~~ SOLN: 10.0000 [IU] | Freq: Every day | SUBCUTANEOUS | Status: DC

## 2019-04-12 NOTE — Patient Instructions (Addendum)
Cut back on levemir by 1 unit a day until AM sugar ~120.  If you still have trouble with lows then let me know.   Use the inhaler before bed and see if that helps with the cough.  Take care.  Glad to see you.

## 2019-04-12 NOTE — Progress Notes (Signed)
This visit occurred during the SARS-CoV-2 public health emergency.  Safety protocols were in place, including screening questions prior to the visit, additional usage of staff PPE, and extensive cleaning of exam room while observing appropriate contact time as indicated for disinfecting solutions.  Inpatient follow-up with known lung cancer, he was acutely worse requiring hospitalization with hemodialysis initiation for acute renal failure.  He improved enough to where he could be discharged with follow-up outpatient hemodialysis.  Here today with family.  In wheelchair.  He is occ lightheaded. He is SOB with exertion.  He has cough.  Sputum is clear.  Coughs more at night. Taking mucinex BID.  SABA helps some, we talked about using that before bed.    History of diabetes.  He is on levemir and SSI.  He has occ lows in the AM, d/w pt about dec levemir in the meantime.  We talked about rationale to limit hypoglycemic risk.  Renal failure.  He has temp HD cath placed.  He has T Th Sat HD.  4 hours per treatment.  He has fatigue after HD then better the next day.  He is still making urine.  We talked about his creatinine improvement.  It is unclear to me if his creatinine is improved because of intrinsic renal improvement or from hemodialysis.  I need renal input on this, discussed with patient. We talked about checking daily weight, given his cardiac and renal situation.  He is checking his daily weight and he knows update [place] as needed, if significant change in weight.  Meds, vitals, and allergies reviewed.   ROS: Per HPI unless specifically indicated in ROS section   GEN: nad, alert and oriented, in wheelchair, chronically ill but not acutely ill-appearing. HEENT:ncat NECK: supple w/o LA CV: rrr.  PULM: ctab except for decreased breath sounds in the left lower lobe, no inc wob ABD: soft, +bs EXT: no edema SKIN: Well-perfused. Hemodialysis catheter dressed on right upper chest.  No sign of  infection locally.  At least 30 minutes were devoted to patient care in this encounter (this can potentially include time spent reviewing the patient's file/history, interviewing and examining the patient, counseling/reviewing plan with patient, ordering referrals, ordering tests, reviewing relevant laboratory or x-ray data, and documenting the encounter).

## 2019-04-13 DIAGNOSIS — N189 Chronic kidney disease, unspecified: Secondary | ICD-10-CM | POA: Diagnosis not present

## 2019-04-13 DIAGNOSIS — N179 Acute kidney failure, unspecified: Secondary | ICD-10-CM | POA: Diagnosis not present

## 2019-04-13 DIAGNOSIS — N184 Chronic kidney disease, stage 4 (severe): Secondary | ICD-10-CM | POA: Diagnosis not present

## 2019-04-14 NOTE — Assessment & Plan Note (Signed)
See above.  Continue with 3 times a week hemodialysis and I need nephrology input about his creatinine improvement in the meantime.  Rationale discussed.  He agreed.

## 2019-04-14 NOTE — Assessment & Plan Note (Signed)
With follow-up pending at oncology.  We talked about hospice.  I told the patient I want him to be able to make his own decisions.  If he decides that he does not want to keep going with active/aggressive chemotherapy and he opts for comfort care and he wants to avoid future hospitalization then hospice may be a reasonable option.  I want him to think about this, see how he does in the near future, see about his plans with oncology regarding ongoing treatment, and then update me as needed.  He agrees.  I thank all involved.

## 2019-04-14 NOTE — Assessment & Plan Note (Signed)
Discussed trying to maintain adequate protein intake.

## 2019-04-14 NOTE — Assessment & Plan Note (Signed)
Discussed lowering his Levemir dose at nighttime and still continue with sliding scale insulin.  Goal to avoid low sugars.  He agrees.

## 2019-04-15 ENCOUNTER — Other Ambulatory Visit: Payer: Self-pay | Admitting: *Deleted

## 2019-04-15 MED ORDER — INSULIN ASPART 100 UNIT/ML ~~LOC~~ SOLN: 0.0000 [IU] | SUBCUTANEOUS | Status: DC

## 2019-04-15 MED ORDER — METOPROLOL SUCCINATE ER 25 MG PO TB24: 12.5000 mg | ORAL_TABLET | Freq: Every day | ORAL | 1 refills | Status: DC

## 2019-04-15 NOTE — Telephone Encounter (Signed)
Patient's wife called stating that patient took his last metoprolol this morning and needs a refill on it. Patient's wife stated that she thinks it was originally given by the hospital doctor. Last refill 04/01/19 #15  Patient's wife stated that he was in last week and was told to let Dr. Damita Dunnings know if his blood sugar continued to run low. Mrs. Jowett stated that he has not taken his Levemir for the past couple of days because his sugar have been low. Patient's wife stated that his non-fasting sugar has been running 104-200 and fasting blood sugars 40-80. Patient wife stated that they checked his blood sugar at 12:30 pm today and it was 243 and two hours late it had dropped to 104. Patient's wife stated that he has only been taking his Novolog once a day and that is usually at lunchtime. Patient's wife wants to know what they should do.

## 2019-04-15 NOTE — Telephone Encounter (Signed)
Metoprolol sent.  Okay to stay off levemir totally for now and okay to only use sliding scale at lunch or if he has a big meal o/w.  I changed the guidelines on his sliding scale, ie use less insulin overall.  Let me know how that goes.  Please update them on the SSI instructions.

## 2019-04-15 NOTE — Telephone Encounter (Signed)
Wife advised and understands instructions.

## 2019-04-16 ENCOUNTER — Other Ambulatory Visit: Payer: Self-pay | Admitting: *Deleted

## 2019-04-16 DIAGNOSIS — N184 Chronic kidney disease, stage 4 (severe): Secondary | ICD-10-CM | POA: Diagnosis not present

## 2019-04-16 DIAGNOSIS — D509 Iron deficiency anemia, unspecified: Secondary | ICD-10-CM | POA: Diagnosis not present

## 2019-04-16 DIAGNOSIS — N2581 Secondary hyperparathyroidism of renal origin: Secondary | ICD-10-CM | POA: Diagnosis not present

## 2019-04-16 DIAGNOSIS — N189 Chronic kidney disease, unspecified: Secondary | ICD-10-CM | POA: Diagnosis not present

## 2019-04-16 DIAGNOSIS — N179 Acute kidney failure, unspecified: Secondary | ICD-10-CM | POA: Diagnosis not present

## 2019-04-17 ENCOUNTER — Inpatient Hospital Stay (HOSPITAL_BASED_OUTPATIENT_CLINIC_OR_DEPARTMENT_OTHER): Payer: Medicare Other | Admitting: Hospice and Palliative Medicine

## 2019-04-17 ENCOUNTER — Encounter: Payer: Self-pay | Admitting: Oncology

## 2019-04-17 ENCOUNTER — Inpatient Hospital Stay: Payer: Medicare Other

## 2019-04-17 ENCOUNTER — Other Ambulatory Visit: Payer: Self-pay

## 2019-04-17 ENCOUNTER — Inpatient Hospital Stay (HOSPITAL_BASED_OUTPATIENT_CLINIC_OR_DEPARTMENT_OTHER): Payer: Medicare Other | Admitting: Oncology

## 2019-04-17 VITALS — BP 90/54 | HR 75 | Temp 95.5°F | Resp 20 | Wt 170.7 lb

## 2019-04-17 DIAGNOSIS — Z515 Encounter for palliative care: Secondary | ICD-10-CM | POA: Diagnosis not present

## 2019-04-17 DIAGNOSIS — Z7189 Other specified counseling: Secondary | ICD-10-CM | POA: Diagnosis not present

## 2019-04-17 DIAGNOSIS — R634 Abnormal weight loss: Secondary | ICD-10-CM | POA: Diagnosis not present

## 2019-04-17 DIAGNOSIS — Z87891 Personal history of nicotine dependence: Secondary | ICD-10-CM | POA: Diagnosis not present

## 2019-04-17 DIAGNOSIS — I509 Heart failure, unspecified: Secondary | ICD-10-CM | POA: Diagnosis not present

## 2019-04-17 DIAGNOSIS — C3490 Malignant neoplasm of unspecified part of unspecified bronchus or lung: Secondary | ICD-10-CM

## 2019-04-17 DIAGNOSIS — R531 Weakness: Secondary | ICD-10-CM | POA: Diagnosis not present

## 2019-04-17 DIAGNOSIS — Z5112 Encounter for antineoplastic immunotherapy: Secondary | ICD-10-CM | POA: Diagnosis not present

## 2019-04-17 DIAGNOSIS — I132 Hypertensive heart and chronic kidney disease with heart failure and with stage 5 chronic kidney disease, or end stage renal disease: Secondary | ICD-10-CM | POA: Diagnosis not present

## 2019-04-17 DIAGNOSIS — R5383 Other fatigue: Secondary | ICD-10-CM

## 2019-04-17 DIAGNOSIS — J910 Malignant pleural effusion: Secondary | ICD-10-CM | POA: Diagnosis not present

## 2019-04-17 DIAGNOSIS — E1136 Type 2 diabetes mellitus with diabetic cataract: Secondary | ICD-10-CM | POA: Diagnosis not present

## 2019-04-17 DIAGNOSIS — Z992 Dependence on renal dialysis: Secondary | ICD-10-CM | POA: Diagnosis not present

## 2019-04-17 DIAGNOSIS — J449 Chronic obstructive pulmonary disease, unspecified: Secondary | ICD-10-CM | POA: Diagnosis not present

## 2019-04-17 LAB — CBC WITH DIFFERENTIAL/PLATELET
Abs Immature Granulocytes: 0.03 10*3/uL (ref 0.00–0.07)
Basophils Absolute: 0.1 10*3/uL (ref 0.0–0.1)
Basophils Relative: 1 %
Eosinophils Absolute: 0.2 10*3/uL (ref 0.0–0.5)
Eosinophils Relative: 4 %
HCT: 32.2 % — ABNORMAL LOW (ref 39.0–52.0)
Hemoglobin: 10.2 g/dL — ABNORMAL LOW (ref 13.0–17.0)
Immature Granulocytes: 0 %
Lymphocytes Relative: 10 %
Lymphs Abs: 0.7 10*3/uL (ref 0.7–4.0)
MCH: 29 pg (ref 26.0–34.0)
MCHC: 31.7 g/dL (ref 30.0–36.0)
MCV: 91.5 fL (ref 80.0–100.0)
Monocytes Absolute: 0.9 10*3/uL (ref 0.1–1.0)
Monocytes Relative: 13 %
Neutro Abs: 4.8 10*3/uL (ref 1.7–7.7)
Neutrophils Relative %: 72 %
Platelets: 362 10*3/uL (ref 150–400)
RBC: 3.52 MIL/uL — ABNORMAL LOW (ref 4.22–5.81)
RDW: 17 % — ABNORMAL HIGH (ref 11.5–15.5)
WBC: 6.7 10*3/uL (ref 4.0–10.5)
nRBC: 0 % (ref 0.0–0.2)

## 2019-04-17 LAB — COMPREHENSIVE METABOLIC PANEL
ALT: 14 U/L (ref 0–44)
AST: 21 U/L (ref 15–41)
Albumin: 2.7 g/dL — ABNORMAL LOW (ref 3.5–5.0)
Alkaline Phosphatase: 106 U/L (ref 38–126)
Anion gap: 12 (ref 5–15)
BUN: 15 mg/dL (ref 8–23)
CO2: 27 mmol/L (ref 22–32)
Calcium: 8.5 mg/dL — ABNORMAL LOW (ref 8.9–10.3)
Chloride: 100 mmol/L (ref 98–111)
Creatinine, Ser: 1.64 mg/dL — ABNORMAL HIGH (ref 0.61–1.24)
GFR calc Af Amer: 50 mL/min — ABNORMAL LOW (ref >60)
GFR calc non Af Amer: 44 mL/min — ABNORMAL LOW (ref >60)
Glucose, Bld: 144 mg/dL — ABNORMAL HIGH (ref 70–99)
Potassium: 3.3 mmol/L — ABNORMAL LOW (ref 3.5–5.1)
Sodium: 139 mmol/L (ref 135–145)
Total Bilirubin: 0.5 mg/dL (ref 0.3–1.2)
Total Protein: 6.8 g/dL (ref 6.5–8.1)

## 2019-04-17 MED ORDER — DRONABINOL 5 MG PO CAPS: 5.0000 mg | ORAL_CAPSULE | Freq: Two times a day (BID) | ORAL | 0 refills | Status: AC

## 2019-04-17 NOTE — Progress Notes (Signed)
Patient reports decline in appetite and has lost 8 lbs.

## 2019-04-17 NOTE — Progress Notes (Signed)
Virtual Visit via Telephone Note  I connected with Craig Koch on 04/17/19 at  1:00 PM EDT by telephone and verified that I am speaking with the correct person using two identifiers.   I discussed the limitations, risks, security and privacy concerns of performing an evaluation and management service by telephone and the availability of in person appointments. I also discussed with the patient that there may be a patient responsible charge related to this service. The patient expressed understanding and agreed to proceed.   History of Present Illness: Craig Urbani Gerringeris a 64 y.o.malewith multiple medical problems including stage IV adenocarcinoma of the lung with history of left sided malignant pleural effusion requiring Pleurx(later removed). Patient has opted not to pursue chemotherapy and has instead been treated on immunotherapy. Patient has had persistent shortness of breath. CTA of the chest on 11/02/2018 revealed marked increase in tumor encasing the entire left lung with new diffuse increased interstitial thickening concerning for disease progression and lymphangitic spread of disease.Repeat CT of the chest and abdomen on 01/22/2019 revealed significant interval decreaseoftumor burden on the lung. Patient was admitted to the hospital on 03/03/2019 with shortness of breath and weakness. He was found to have non-STEMI and DKA. Patient was referred to palliative care to help address goals and manage ongoing symptoms.   Observations/Objective: I called and spoke with patient by phone.  He reports doing reasonably well.  No significant changes or concerns.  He saw Dr. Tasia Catchings today.  Treatment is on hold for 2 weeks per patient request.  Patient continues to receive dialysis but there is discussion about possible discontinuation as patient's renal function is improving.  Patient does endorse chronic fatigue and poor appetite.  He has some nausea at times.  Weight has declined 10 pounds over  the past month.  Assessment and Plan: Stage IV adenocarcinoma of the lung -BRAF targeted therapy was discontinued due to recent illness plan for future treatment depending upon patient's goals and hemodialysis status.  Patient has follow-up planned in 2 weeks with Dr. Tasia Catchings to discuss further treatment options.  We will follow  Weight loss -refer to nutrition  Follow Up Instructions: Follow-up visit when patient returns to see Dr. Tasia Catchings   I discussed the assessment and treatment plan with the patient. The patient was provided an opportunity to ask questions and all were answered. The patient agreed with the plan and demonstrated an understanding of the instructions.   The patient was advised to call back or seek an in-person evaluation if the symptoms worsen or if the condition fails to improve as anticipated.  I provided 5 minutes of non-face-to-face time during this encounter.   Irean Hong, NP

## 2019-04-17 NOTE — Progress Notes (Signed)
Hematology/Oncologyfollow up  note [facility] Telephone:(336) [phone] Fax:(336) [phone]   Patient Care Team: Tonia Ghent, MD as PCP - General (Family Medicine) Thelma Comp, [place] (Optometry) Telford Nab, RN as Registered Nurse  REFERRING PROVIDER: Tonia Ghent, MD  CHIEF COMPLAINTS/REASON FOR VISIT:  Follow-up for metastatic lung adenocarcinoma  HISTORY OF PRESENTING ILLNESS:   Craig Koch is a  64 y.o.  male with PMH listed below was seen in consultation at the request of  Tonia Ghent, MD  for evaluation of malignant pleural effusion. Patient was recently admitted to [facility] due to large amount of pleural effusion, acute hypoxic respiratory failure. Patient reports feeling shortness of breath and dyspnea on exertion for several weeks and presented to emergency room.  A chest x-ray 08/23/2018 showed a large amount of left pleural effusion 08/24/2018 CT chest abdomen pelvis confirmed large left pleural effusion with near complete collapse of the left lower lobe and extensive volume loss in the lingula and a portion of the left upper lobe. No acute intra-abdominal process.  Cholelithiasis, diverticulitis, coronary atherosclerosis post CABG.  Bilateral renal cortical scarring.  Aortic atherosclerosis.  #08/24/2018 ultrasound guided diagnostic and therapeutic thoracentesis removed 1.8 L of pleural fluid. Patient subsequently felt better and was discharged to follow-up with pulmonology. 09/04/2018 patient followed up with Dr.Aleskerov chest x-ray showed recurrent moderate left pleural effusion.  Patient underwent ultrasound-guided left thoracentesis again and it drained 3 L of fluid. Pleural fluid cytology positive for adenocarcinoma. Patient was referred to me for further evaluation and management.  # PET eft sided pleural hypermetabolic activity  consistent with malignant pleural effusion. Left upper and lower lobe low-level hypermetabolic him  corresponding to areas of airspace and groundglass opacity.  This was felt to be secondary to atelectasis.  Underlying left upper lobe primary bronchogenic carcinoma cannot be excluded. Medial left upper lobe area more focal hypermetabolic area could represent a pleural disease or a site of small left upper lobe primary. No evidence of extra thoracic hypermetabolic metastatic disease.  Left sided hydro-pneumothorax within the pleural air component being new since the prior CT. Cholelithiasis.  #MRI brain showed punctate focus of enhancement within the right parietal lobe cortex suspicious for possible metastasis.  Molecular study showed BRAF V600 mutation. Treated with BRAF targeted therapies. He had good response  #Patient was admitted from 03/03/2019-03/19/2019 due to DKA due to uncontrolled diabetes, acute decompensation of CHF left lower lobe pneumonia right pleural effusion status post thoracentesis.  Fluid study was consistent with transudate. AKI secondary to ATN, on hemodialysis Tuesday Thursday and Saturday, atrial fibrillation with RVR, paroxysmal.  Patient was discharged on Eliquis and amiodarone. Patient was readmitted from 03/26/2019-04/01/2019 due to CHF exacerbation, right pleural effusion recurrent.  Status post diagnostic and therapeutic thoracentesis.  Cytology was negative for malignant cells.   INTERVAL HISTORY Craig Koch is a 64 y.o. male who has above history reviewed by me today presents for follow-up of lung cancer Problems and complaints are listed below:  Patient was accompanied by his wife today. He reports feeling weak and fatigued. No appetite. Weight has decreased 8 pounds. He is still on hemodialysis. Pending decision from nephrologist to see if he will need to continue long-term dialysis. He has good urine output. Ferritin continues to decrease.  He reports feeling more shortness of breath with exertion. No cough. Nausea is better with  antiemetics.     Review of Systems  Constitutional: Positive for appetite change and fatigue. Negative for chills, fever  and unexpected weight change.  HENT:   Negative for hearing loss and voice change.   Eyes: Negative for eye problems and icterus.  Respiratory: Positive for shortness of breath. Negative for chest tightness and cough.   Cardiovascular: Negative for chest pain and leg swelling.  Gastrointestinal: Negative for abdominal distention, abdominal pain and nausea.  Endocrine: Negative for hot flashes.  Genitourinary: Negative for difficulty urinating, dysuria and frequency.   Musculoskeletal: Negative for arthralgias.  Skin: Negative for itching and rash.  Neurological: Negative for light-headedness and numbness.  Hematological: Negative for adenopathy. Does not bruise/bleed easily.  Psychiatric/Behavioral: Negative for confusion.    MEDICAL HISTORY:  Past Medical History:  Diagnosis Date  . Anxiety   . Arthritis   . CHF (congestive heart failure) ([place])   . Coronary artery disease   . Diabetes mellitus   . Dyslipidemia   . Hx of CABG   . Hyperlipidemia   . Hypertension   . Malignant neoplasm of unspecified part of unspecified bronchus or lung ([place]) 08/2018   Immunotherapy  . Pleural effusion     SURGICAL HISTORY: Past Surgical History:  Procedure Laterality Date  . CATARACT EXTRACTION Left 09/2015  . CHEST TUBE INSERTION Left 10/01/2018   Procedure: INSERTION PLEURAL DRAINAGE CATHETER;  Surgeon: [name], MD;  Location: [facility] ORS;  Service: Thoracic;  Laterality: Left;  . CORONARY ARTERY BYPASS GRAFT  2004   (CABG with LIMA to the  LAD, SVG to OM2/OM3, SVG  to diag  . DIALYSIS/PERMA CATHETER INSERTION N/A 03/14/2019   Procedure: DIALYSIS/PERMA CATHETER INSERTION;  Surgeon: Algernon Huxley, MD;  Location: [place] [facility];  Service: Cardiovascular;  Laterality: N/A;  . Left ankle surgery     repair of fracture  . Right lower leg surgery     rod  .  TEMPORARY DIALYSIS CATHETER N/A 03/11/2019   Procedure: TEMPORARY DIALYSIS CATHETER;  Surgeon: Algernon Huxley, MD;  Location: [place] [facility];  Service: Cardiovascular;  Laterality: N/A;   SOCIAL HISTORY: Social History   Socioeconomic History  . Marital status: Married    Spouse name: Not on file  . Number of children: Not on file  . Years of education: Not on file  . Highest education level: Not on file  Occupational History  . Not on file  Tobacco Use  . Smoking status: Former Smoker    Packs/day: 0.20    Years: 45.00    Pack years: 9.00    Types: Cigarettes    Quit date: 03/05/2019    Years since quitting: 0.1  . Smokeless tobacco: Former Systems developer    Types: Snuff  Substance and Sexual Activity  . Alcohol use: Yes    Alcohol/week: 6.0 standard drinks    Types: 6 Cans of beer per week    Comment: occ, average 6 pack in a week  . Drug use: No  . Sexual activity: Not Currently  Other Topics Concern  . Not on file  Social History Narrative   On disability 2009 after prev injuries and CAD.     Married 1976   2 kids, 4 grandkids.    Social Determinants of Health   Financial Resource Strain:   . Difficulty of Paying Living Expenses:   Food Insecurity:   . Worried About Charity fundraiser in the Last Year:   . Arboriculturist in the Last Year:   Transportation Needs:   . Film/video editor (Medical):   [name] Lack of Transportation (  Non-Medical):   Physical Activity:   . Days of Exercise per Week:   . Minutes of Exercise per Session:   Stress:   . Feeling of Stress :   Social Connections:   . Frequency of Communication with Friends and Family:   . Frequency of Social Gatherings with Friends and Family:   . Attends Religious Services:   . Active Member of Clubs or Organizations:   . Attends Archivist Meetings:   [name] Marital Status:   Intimate Partner Violence:   . Fear of Current or Ex-Partner:   . Emotionally Abused:   [name] Physically Abused:   . Sexually  Abused:      FAMILY HISTORY: Family History  Problem Relation Age of Onset  . Dementia Mother   . Heart disease Father   . Colon cancer Neg Hx   . Prostate cancer Neg Hx   . Diabetes Neg Hx    ALLERGIES:  Allergies  Allergen Reactions  . Lipitor [Atorvastatin Calcium] Other (See Comments)    Aches.  Tolerated crestor.      MEDICATIONS: PHYSICAL EXAMINATION: Current Outpatient Medications on File Prior to Visit  Medication Sig Dispense Refill  . acetaminophen (TYLENOL) 325 MG tablet Take 2 tablets (650 mg total) by mouth every 6 (six) hours as needed for mild pain or fever.    [name] albuterol (VENTOLIN HFA) 108 (90 Base) MCG/ACT inhaler Inhale 2 puffs into the lungs every 6 (six) hours as needed for wheezing or shortness of breath. 18 g 0  . amiodarone (PACERONE) 200 MG tablet Take 1 tablet (200 mg total) by mouth daily. 30 tablet 0  . clopidogrel (PLAVIX) 75 MG tablet Take 1 tablet (75 mg total) by mouth daily. 30 tablet 0  . dextromethorphan-guaiFENesin (MUCINEX DM) 30-600 MG 12hr tablet Take 1 tablet by mouth 2 (two) times daily.    [name] dronabinol (MARINOL) 5 MG capsule Take 1 capsule (5 mg total) by mouth 2 (two) times daily before a meal. 60 capsule 0  . DULoxetine (CYMBALTA) 30 MG capsule Take 1 capsule (30 mg total) by mouth daily. 30 capsule 0  . Ensure Max Protein (ENSURE MAX PROTEIN) LIQD Take 330 mLs (11 oz total) by mouth 2 (two) times daily between meals.    . ferrous sulfate 325 (65 FE) MG tablet Take 1 tablet (325 mg total) by mouth 2 (two) times daily with a meal. 60 tablet 0  . folic acid (FOLVITE) 1 MG tablet Take 1 tablet (1 mg total) by mouth daily.    . insulin aspart (NOVOLOG) 100 UNIT/ML injection Inject 0-8 Units into the skin as directed. Take 0 units if CBG 70-150 Take 1 units if CBG 151-200 Take 2 units if CBG 201-250 Take 3 units if CBG 251-300 Take 4 units if CBG 301-350 Take 6 units if CBG 351-400 If CBG > 400, give 8 units and call MD 10 mL   .  metoprolol succinate (TOPROL-XL) 25 MG 24 hr tablet Take 0.5 tablets (12.5 mg total) by mouth daily. 45 tablet 1  . multivitamin (RENA-VIT) TABS tablet Take 1 tablet by mouth at bedtime.  0  . polyethylene glycol (MIRALAX / GLYCOLAX) 17 g packet Take 17 g by mouth daily. 14 each 0  . promethazine (PHENERGAN) 25 MG tablet Take 1 tablet (25 mg total) by mouth every 6 (six) hours as needed for nausea or vomiting. 60 tablet 0  . rosuvastatin (CRESTOR) 10 MG tablet TAKE 1 TABLET (10 MG TOTAL) BY MOUTH  DAILY. 90 tablet 3  . simethicone (MYLICON) 80 MG chewable tablet Chew 1 tablet (80 mg total) by mouth 4 (four) times daily as needed for flatulence. 30 tablet 0  . thiamine 100 MG tablet Take 1 tablet (100 mg total) by mouth daily.    . insulin detemir (LEVEMIR) 100 UNIT/ML injection Inject 0.1-0.15 mLs (10-15 Units total) into the skin at bedtime. (Patient not taking: Reported on 04/17/2019)     No current facility-administered medications on file prior to visit.    ECOG PERFORMANCE STATUS:2  Today's Vitals   04/17/19 0945  BP: (!) 90/54  Pulse: 75  Resp: 20  Temp: (!) 95.5 F (35.3 C)  SpO2: 100%  Weight: 170 lb 11.2 oz (77.4 kg)  PainSc: 0-No pain   Body mass index is 23.15 kg/m.  Physical Exam Constitutional:      General: He is not in acute distress.    Appearance: He is ill-appearing.     Comments: Patient sits in the wheelchair.  HENT:     Head: Normocephalic and atraumatic.  Eyes:     General: No scleral icterus.    Pupils: Pupils are equal, round, and reactive to light.  Cardiovascular:     Rate and Rhythm: Normal rate and regular rhythm.     Heart sounds: Normal heart sounds.  Pulmonary:     Effort: Pulmonary effort is normal. No respiratory distress.     Breath sounds: No wheezing.     Comments: Decreased breathing sounds bilateral. Abdominal:     General: Bowel sounds are normal. There is no distension.     Palpations: Abdomen is soft. There is no mass.      Tenderness: There is no abdominal tenderness.  Musculoskeletal:        General: No deformity. Normal range of motion.     Cervical back: Normal range of motion and neck supple.  Skin:    General: Skin is warm and dry.     Findings: No erythema or rash.  Neurological:     Mental Status: He is alert and oriented to person, place, and time. Mental status is at baseline.     Cranial Nerves: No cranial nerve deficit.     Coordination: Coordination normal.  Psychiatric:        Mood and Affect: Mood normal.     LABORATORY DATA:  I have reviewed the data as listed Lab Results  Component Value Date   WBC 18.9 (H) 11/09/2018   HGB 11.0 (L) 11/09/2018   HCT 33.5 (L) 11/09/2018   MCV 89.8 11/09/2018   PLT 581 (H) 11/09/2018   Recent Labs    10/19/18 0841 11/02/18 1238 11/09/18 0833  NA 137 132* 131*  K 4.0 4.0 3.5  CL 104 95* 94*  CO2 _0 GLUCOSE 252* 425* 361*  BUN _1 CREATININE 0.61 0.84 0.77  CALCIUM 8.5* 8.5* 8.0*  GFRNONAA >60 >60 >60  GFRAA >60 >60 >60  PROT 6.0* 7.0 6.5  ALBUMIN 3.0* 2.6* 2.3*  AST 14* 11* 12*  ALT _2 ALKPHOS 69 75 63  BILITOT 0.5 0.9 0.8   Iron/TIBC/Ferritin/ %Sat No results found for: IRON, TIBC, FERRITIN, IRONPCTSAT    RADIOGRAPHIC STUDIES: I have personally reviewed the radiological images as listed and agreed with the findings in the report.   DG Chest 1 View  Result Date: 03/14/2019 CLINICAL DATA:  Wheezing EXAM: CHEST  1 VIEW COMPARISON:  03/03/2019 FINDINGS: Borderline heart size.  CABG. Diffuse interstitial opacity with Kerley lines and left more than right pleural effusion. No pneumothorax. IMPRESSION: CHF. Electronically Signed   By: Monte Fantasia M.D.   On: 03/14/2019 05:47   DG Chest 2 View  Result Date: 03/28/2019 CLINICAL DATA:  End-stage renal disease EXAM: CHEST - 2 VIEW COMPARISON:  03/26/2019 FINDINGS: Cardiac shadow is mildly enlarged but stable. Postsurgical changes and dialysis catheter are again  seen. Bilateral pleural effusions are seen stable in appearance from the prior exam. Mild vascular congestion is again noted but slightly improved. No focal confluent infiltrate is seen. IMPRESSION: Slight improvement in the degree of vascular congestion. Bilateral pleural effusions stable from the prior exam. Electronically Signed   By: Inez Catalina M.D.   On: 03/28/2019 14:22   DG Chest 2 View  Result Date: 03/26/2019 CLINICAL DATA:  Shortness of breath EXAM: CHEST - 2 VIEW COMPARISON:  March 15, 2019 FINDINGS: Heart is mildly enlarged with pulmonary venous hypertension. There is a pleural effusion on the left. There is consolidation in the medial left base. There is mild interstitial edema present with atelectatic change in the left mid and lower lung zones. Patient is status post coronary artery bypass grafting. Central catheter tip is in the superior vena cava. No pneumothorax. No bone lesions. IMPRESSION: Cardiomegaly with pulmonary vascular congestion. Left pleural effusion. There is a degree of interstitial edema. Suspect a degree of congestive heart failure. Consolidation in the medial left base may represent alveolar edema or pneumonia. Both entities may be present concurrently. Central catheter tip is in the superior vena cava. Patient is status post coronary artery bypass grafting. Electronically Signed   By: Lowella Grip III M.D.   On: 03/26/2019 16:56   CT HEAD WO CONTRAST  Result Date: 03/05/2019 CLINICAL DATA:  Metastatic disease evaluation.  Lung cancer. EXAM: CT HEAD WITHOUT CONTRAST TECHNIQUE: Contiguous axial images were obtained from the base of the skull through the vertex without intravenous contrast. COMPARISON:  MRI head 09/20/2018 FINDINGS: Brain: Mild atrophy. Mild hypodensity in the white matter unchanged from the prior MRI. No acute infarct, hemorrhage, mass. No edema or midline shift. Vascular: Negative for hyperdense vessel Skull: Negative skull. Sinuses/Orbits: Mucosal  edema and bony thickening right maxillary sinus. Chronic nasal bone fracture. Left cataract extraction. Other: None IMPRESSION: No acute abnormality and negative for metastatic disease on unenhanced CT. Atrophy and mild chronic microvascular ischemia. Electronically Signed   By: Franchot Gallo M.D.   On: 03/05/2019 15:28   CT CHEST WO CONTRAST  Result Date: 03/14/2019 CLINICAL DATA:  Respiratory failure. EXAM: CT CHEST WITHOUT CONTRAST TECHNIQUE: Multidetector CT imaging of the chest was performed following the standard protocol without IV contrast. COMPARISON:  Chest CT dated 01/22/2019. FINDINGS: Cardiovascular: Heart size appears stable. No pericardial effusion. Diffuse coronary artery calcifications. Surgical changes of median sternotomy for presumed CABG. Mediastinum/Nodes: No mass or enlarged lymph nodes seen within the mediastinum. Esophagus appears normal. Trachea is unremarkable. Lungs/Pleura: New RIGHT pleural effusion, moderate to large in size. Associated compressive atelectasis within the RIGHT perihilar and lower lobe. New ill-defined consolidation within the LEFT lower lobe, suspected pneumonia. Probable additional rounded atelectasis within the lingula. Diffuse interstitial thickening indicating some degree of volume overload versus atypical infection. Upper Abdomen: Limited images of the upper abdomen are unremarkable. Musculoskeletal: No acute or suspicious osseous finding. IMPRESSION: 1. New ill-defined consolidation within the LEFT lower lobe, suspected pneumonia. 2. New moderate to large-sized RIGHT pleural effusion, with associated compressive atelectasis. 3. Diffuse interstitial thickening, most  likely edema/fluid overload, less likely additional atypical infection. 4. Probable additional rounded atelectasis within the lingula. Aortic Atherosclerosis (ICD10-I70.0). Electronically Signed   By: Franki Cabot M.D.   On: 03/14/2019 19:47   CT Chest W Contrast  Result Date:  01/22/2019 CLINICAL DATA:  Restaging lung cancer, immunotherapy EXAM: CT CHEST, ABDOMEN, AND PELVIS WITH CONTRAST TECHNIQUE: Multidetector CT imaging of the chest, abdomen and pelvis was performed following the standard protocol during bolus administration of intravenous contrast. CONTRAST:  174m OMNIPAQUE IOHEXOL 300 MG/ML SOLN, additional oral enteric contrast COMPARISON:  CT chest angiogram, 11/02/2018, PET-CT, 09/18/2018 FINDINGS: CT CHEST FINDINGS Cardiovascular: Aortic atherosclerosis. Normal heart size. Extensive 3 vessel coronary artery calcifications and/or stents. No pericardial effusion. Mediastinum/Nodes: No enlarged mediastinal, hilar, or axillary lymph nodes. Redemonstrated subcentimeter hypodense nodule of the left lobe of the thyroid. Trachea, and esophagus demonstrate no significant findings. Lungs/Pleura: There has been a substantial interval decrease in rinded pleural thickening and interlobular septal thickening about the left lung, with mild, persistent pleural thickening throughout and a small, loculated left pleural effusion. A previously seen left-sided tunneled pleural drainage catheter has been removed. Musculoskeletal: No chest wall mass or suspicious bone lesions identified. CT ABDOMEN PELVIS FINDINGS Hepatobiliary: No solid liver abnormality is seen. Small gallstone in the gallbladder. No gallbladder wall thickening, or biliary dilatation. Pancreas: Unremarkable. No pancreatic ductal dilatation or surrounding inflammatory changes. Spleen: Normal in size without significant abnormality. Adrenals/Urinary Tract: Adrenal glands are unremarkable. Kidneys are normal, without renal calculi, solid lesion, or hydronephrosis. Bladder is unremarkable. Stomach/Bowel: Stomach is within normal limits. Appendix appears normal. No evidence of bowel wall thickening, distention, or inflammatory changes. Vascular/Lymphatic: Severe mixed aortic atherosclerosis. No enlarged abdominal or pelvic lymph nodes.  Reproductive: No mass or other abnormality. Other: No abdominal wall hernia or abnormality. No abdominopelvic ascites. Musculoskeletal: No acute or significant osseous findings. IMPRESSION: 1. Significant interval decrease in rinded pleural thickening and interlobular septal thickening about the left lung, with mild, persistent pleural thickening throughout and a small, loculated left pleural effusion. Findings are consistent with significant treatment response of pleural metastatic disease, particularly when compared to most recent CT angiogram dated 11/02/2018. 2. A previously seen left-sided tunneled pleural drainage catheter has been removed. 3. No evidence of metastatic disease in the abdomen or pelvis. 4. Cholelithiasis. 5. Coronary artery disease.  Aortic Atherosclerosis (ICD10-I70.0). Electronically Signed   By: AEddie CandleM.D.   On: 01/22/2019 10:41   CT Abdomen Pelvis W Contrast  Result Date: 01/22/2019 CLINICAL DATA:  Restaging lung cancer, immunotherapy EXAM: CT CHEST, ABDOMEN, AND PELVIS WITH CONTRAST TECHNIQUE: Multidetector CT imaging of the chest, abdomen and pelvis was performed following the standard protocol during bolus administration of intravenous contrast. CONTRAST:  1077mOMNIPAQUE IOHEXOL 300 MG/ML SOLN, additional oral enteric contrast COMPARISON:  CT chest angiogram, 11/02/2018, PET-CT, 09/18/2018 FINDINGS: CT CHEST FINDINGS Cardiovascular: Aortic atherosclerosis. Normal heart size. Extensive 3 vessel coronary artery calcifications and/or stents. No pericardial effusion. Mediastinum/Nodes: No enlarged mediastinal, hilar, or axillary lymph nodes. Redemonstrated subcentimeter hypodense nodule of the left lobe of the thyroid. Trachea, and esophagus demonstrate no significant findings. Lungs/Pleura: There has been a substantial interval decrease in rinded pleural thickening and interlobular septal thickening about the left lung, with mild, persistent pleural thickening throughout and a  small, loculated left pleural effusion. A previously seen left-sided tunneled pleural drainage catheter has been removed. Musculoskeletal: No chest wall mass or suspicious bone lesions identified. CT ABDOMEN PELVIS FINDINGS Hepatobiliary: No solid liver abnormality is seen. Small gallstone in  the gallbladder. No gallbladder wall thickening, or biliary dilatation. Pancreas: Unremarkable. No pancreatic ductal dilatation or surrounding inflammatory changes. Spleen: Normal in size without significant abnormality. Adrenals/Urinary Tract: Adrenal glands are unremarkable. Kidneys are normal, without renal calculi, solid lesion, or hydronephrosis. Bladder is unremarkable. Stomach/Bowel: Stomach is within normal limits. Appendix appears normal. No evidence of bowel wall thickening, distention, or inflammatory changes. Vascular/Lymphatic: Severe mixed aortic atherosclerosis. No enlarged abdominal or pelvic lymph nodes. Reproductive: No mass or other abnormality. Other: No abdominal wall hernia or abnormality. No abdominopelvic ascites. Musculoskeletal: No acute or significant osseous findings. IMPRESSION: 1. Significant interval decrease in rinded pleural thickening and interlobular septal thickening about the left lung, with mild, persistent pleural thickening throughout and a small, loculated left pleural effusion. Findings are consistent with significant treatment response of pleural metastatic disease, particularly when compared to most recent CT angiogram dated 11/02/2018. 2. A previously seen left-sided tunneled pleural drainage catheter has been removed. 3. No evidence of metastatic disease in the abdomen or pelvis. 4. Cholelithiasis. 5. Coronary artery disease.  Aortic Atherosclerosis (ICD10-I70.0). Electronically Signed   By: Eddie Candle M.D.   On: 01/22/2019 10:41   US RENAL  Result Date: 03/10/2019 CLINICAL DATA:  64 year old presenting with acute renal failure. Evaluate for obstruction as a possible cause.  EXAM: RENAL / URINARY TRACT ULTRASOUND COMPLETE COMPARISON:  Urinary tract ultrasound 03/04/2019. CT abdomen and pelvis 01/22/2019. FINDINGS: Right Kidney: Renal measurements: Approximately 12.6 x 6.4 x 6.2 cm = volume: 264 mL. Mildly echogenic parenchyma. No hydronephrosis. Well-preserved cortex. No shadowing calculi. No focal parenchymal abnormality. Left Kidney: Renal measurements: Approximately 12.5 x 6.3 x 6.9 cm = volume: 283 mL. Mildly echogenic parenchyma. No hydronephrosis. Well-preserved cortex. No shadowing calculi. No focal parenchymal abnormality. Bladder: Normal for degree of bladder distention. Other: None. IMPRESSION: 1. Mildly echogenic renal parenchyma indicative of medical renal disease. 2. Otherwise normal examination. Specifically, no evidence of urinary tract obstruction to account for the acute renal insufficiency. Electronically Signed   By: Evangeline Dakin M.D.   On: 03/10/2019 15:02   US RENAL  Result Date: 03/04/2019 CLINICAL DATA:  Acute kidney injury EXAM: RENAL / URINARY TRACT ULTRASOUND COMPLETE COMPARISON:  CT 01/22/2019 FINDINGS: Right Kidney: Renal measurements: 11.4 x 6 x 6 cm = volume: 215 mL . Echogenicity within normal limits. No mass or hydronephrosis visualized. Left Kidney: Renal measurements: 11.8 x 7.3 x 6.5 cm = volume: 291.8 mL. Echogenicity within normal limits. No mass or hydronephrosis visualized. Bladder: Appears normal for degree of bladder distention. Other: Incidental note made of gallstones and small effusions. Heterogenous prostate. IMPRESSION: Normal ultrasound appearance of the kidneys Electronically Signed   By: Donavan Foil M.D.   On: 03/04/2019 15:37   PERIPHERAL VASCULAR CATHETERIZATION  Result Date: 03/14/2019 See op note  PERIPHERAL VASCULAR CATHETERIZATION  Result Date: 03/11/2019 See op note  US Venous Img Lower Bilateral  Result Date: 01/29/2019 CLINICAL DATA:  Bilateral lower extremity edema. History of pulmonary embolism and lung  cancer. Evaluate for DVT. EXAM: BILATERAL LOWER EXTREMITY VENOUS DOPPLER ULTRASOUND TECHNIQUE: Gray-scale sonography with graded compression, as well as color Doppler and duplex ultrasound were performed to evaluate the lower extremity deep venous systems from the level of the common femoral vein and including the common femoral, femoral, profunda femoral, popliteal and calf veins including the posterior tibial, peroneal and gastrocnemius veins when visible. The superficial great saphenous vein was also interrogated. Spectral Doppler was utilized to evaluate flow at rest and with distal augmentation maneuvers in the  common femoral, femoral and popliteal veins. COMPARISON:  None. FINDINGS: RIGHT LOWER EXTREMITY Common Femoral Vein: No evidence of thrombus. Normal compressibility, respiratory phasicity and response to augmentation. Saphenofemoral Junction: No evidence of thrombus. Normal compressibility and flow on color Doppler imaging. Profunda Femoral Vein: No evidence of thrombus. Normal compressibility and flow on color Doppler imaging. Femoral Vein: No evidence of thrombus. Normal compressibility, respiratory phasicity and response to augmentation. Popliteal Vein: No evidence of thrombus. Normal compressibility, respiratory phasicity and response to augmentation. Calf Veins: No evidence of thrombus. Normal compressibility and flow on color Doppler imaging. Superficial Great Saphenous Vein: No evidence of thrombus. Normal compressibility. Venous Reflux:  None. Other Findings:  None. LEFT LOWER EXTREMITY Common Femoral Vein: No evidence of thrombus. Normal compressibility, respiratory phasicity and response to augmentation. Saphenofemoral Junction: No evidence of thrombus. Normal compressibility and flow on color Doppler imaging. Profunda Femoral Vein: No evidence of thrombus. Normal compressibility and flow on color Doppler imaging. Femoral Vein: No evidence of thrombus. Normal compressibility, respiratory  phasicity and response to augmentation. Popliteal Vein: No evidence of thrombus. Normal compressibility, respiratory phasicity and response to augmentation. Calf Veins: No evidence of thrombus. Normal compressibility and flow on color Doppler imaging. Superficial Great Saphenous Vein: No evidence of thrombus. Normal compressibility. Venous Reflux:  None. Other Findings:  None. IMPRESSION: No evidence of DVT within either lower extremity. Electronically Signed   By: Sandi Mariscal M.D.   On: 01/29/2019 16:09   DG Chest Port 1 View  Result Date: 03/29/2019 CLINICAL DATA:  Post thoracentesis. EXAM: PORTABLE CHEST 1 VIEW COMPARISON:  03/28/2019. FINDINGS: Post thoracentesis chest x-ray reveals no evidence of pneumothorax. Significant reduction in right-sided pleural effusion. Small left pleural effusion again noted. Bibasilar atelectasis/infiltrates again noted. Prior CABG. Right IJ line stable position. IMPRESSION: Post thoracentesis chest x-ray reveals no evidence of pneumothorax. Significant reduction in right-sided pleural effusion. Electronically Signed   By: Marcello Moores  Register   On: 03/29/2019 15:01   DG Chest Port 1 View  Result Date: 03/15/2019 CLINICAL DATA:  Status post thoracentesis. EXAM: PORTABLE CHEST 1 VIEW COMPARISON:  03/14/2019 FINDINGS: Interval decrease right pleural effusion. No evidence for pneumothorax. Bibasilar atelectasis/infiltrate again noted with persistent small left pleural effusion. The cardiopericardial silhouette is within normal limits for size. Pulmonary vascular congestion has decreased in the interval. Right IJ central line tip overlies the mid to distal SVC. The visualized bony structures of the thorax are intact. Telemetry leads overlie the chest. IMPRESSION: Interval decrease in right pleural effusion without evidence of pneumothorax. Decrease in pulmonary vascular congestion with persistent bibasilar atelectasis/infiltrate and small left pleural effusion. Electronically  Signed   By: Misty Stanley M.D.   On: 03/15/2019 11:36   DG Chest Portable 1 View  Result Date: 03/03/2019 CLINICAL DATA:  Shortness of breath, since Monday worsening this morning around 1 a.m. EXAM: PORTABLE CHEST 1 VIEW COMPARISON:  12/19/2018 FINDINGS: Cardiomediastinal contours are stable following median sternotomy with persistent left-sided pleural effusion and pleural-parenchymal scarring. No new areas of consolidation or evidence of right-sided pleural effusion. No signs of acute bone process. IMPRESSION: Persistent left-sided pleural effusion and pleural-parenchymal scarring. Electronically Signed   By: Zetta Bills M.D.   On: 03/03/2019 13:59   ECHOCARDIOGRAM COMPLETE  Result Date: 03/15/2019    ECHOCARDIOGRAM REPORT   Patient Name:   VORIS TIGERT Date of Exam: 03/15/2019 Medical Rec #:  [id number]       Height:       72.0 in Accession #:  [id number]      Weight:       201.4 lb Date of Birth:  25-Dec-1955       BSA:          2.137 m Patient Age:    67 years        BP:           125/69 mmHg Patient Gender: M               HR:           83 bpm. Exam Location:  [facility] Procedure: 2D Echo, Cardiac Doppler and Color Doppler Indications:     Respiratory distress  History:         Patient has prior history of Echocardiogram examinations, most                  recent 03/04/2019. Prior CABG; Risk Factors:Hypertension and                  Diabetes.  Sonographer:     Sherrie Sport RDCS (AE) Referring Phys:  [id number] Claiborne Billings A GRIFFITH Diagnosing Phys: Kathlyn Sacramento MD IMPRESSIONS  1. Left ventricular ejection fraction, by estimation, is 35 to 40%. The left ventricle has moderately decreased function. The left ventricle demonstrates regional wall motion abnormalities . The left ventricular internal cavity size was mildly dilated. There is mild left ventricular hypertrophy. Left ventricular diastolic parameters are consistent with Grade II diastolic dysfunction (pseudonormalization). There is moderate hypokinesis of  the left ventricular, entire inferior wall.  2. Right ventricular systolic function is normal. The right ventricular size is normal. There is moderately elevated pulmonary artery systolic pressure. The estimated right ventricular systolic pressure is 46.8 mmHg.  3. Left atrial size was mildly dilated.  4. The mitral valve is normal in structure. Moderate mitral valve regurgitation. No evidence of mitral stenosis.  5. The aortic valve is abnormal. Aortic valve regurgitation is mild. Mild to moderate aortic valve sclerosis/calcification is present, without any evidence of aortic stenosis.  6. The inferior vena cava is normal in size with greater than 50% respiratory variability, suggesting right atrial pressure of 3 mmHg. FINDINGS  Left Ventricle: Left ventricular ejection fraction, by estimation, is 35 to 40%. The left ventricle has moderately decreased function. The left ventricle demonstrates regional wall motion abnormalities. Moderate hypokinesis of the left ventricular, entire inferior wall. The left ventricular internal cavity size was mildly dilated. There is mild left ventricular hypertrophy. Left ventricular diastolic parameters are consistent with Grade II diastolic dysfunction (pseudonormalization). Right Ventricle: The right ventricular size is normal. No increase in right ventricular wall thickness. Right ventricular systolic function is normal. There is moderately elevated pulmonary artery systolic pressure. The tricuspid regurgitant velocity is 3.37 m/s, and with an assumed right atrial pressure of 3 mmHg, the estimated right ventricular systolic pressure is 03.2 mmHg. Left Atrium: Left atrial size was mildly dilated. Right Atrium: Right atrial size was normal in size. Pericardium: There is no evidence of pericardial effusion. Mitral Valve: The mitral valve is normal in structure. Normal mobility of the mitral valve leaflets. Moderate mitral valve regurgitation. No evidence of mitral valve stenosis.  Tricuspid Valve: The tricuspid valve is normal in structure. Tricuspid valve regurgitation is mild . No evidence of tricuspid stenosis. Aortic Valve: The aortic valve is abnormal. Aortic valve regurgitation is mild. Mild to moderate aortic valve sclerosis/calcification is present, without any evidence of aortic stenosis. Aortic valve mean gradient measures 4.5 mmHg. Aortic valve peak gradient measures  8.4 mmHg. Aortic valve area, by VTI measures 2.54 cm. Pulmonic Valve: The pulmonic valve was normal in structure. Pulmonic valve regurgitation is not visualized. No evidence of pulmonic stenosis. Aorta: The aortic root is normal in size and structure. Venous: The inferior vena cava is normal in size with greater than 50% respiratory variability, suggesting right atrial pressure of 3 mmHg. IAS/Shunts: No atrial level shunt detected by color flow Doppler.  LEFT VENTRICLE PLAX 2D LVIDd:         5.34 cm      Diastology LVIDs:         4.38 cm      LV e' lateral:   5.77 cm/s LV PW:         1.00 cm      LV E/e' lateral: 22.4 LV IVS:        1.20 cm      LV e' medial:    5.00 cm/s LVOT diam:     2.00 cm      LV E/e' medial:  25.8 LV SV:         63 LV SV Index:   30 LVOT Area:     3.14 cm  LV Volumes (MOD) LV vol d, MOD A2C: 227.0 ml LV vol d, MOD A4C: 148.0 ml LV vol s, MOD A2C: 142.0 ml LV vol s, MOD A4C: 110.0 ml LV SV MOD A2C:     85.0 ml LV SV MOD A4C:     148.0 ml LV SV MOD BP:      59.1 ml RIGHT VENTRICLE RV Basal diam:  4.14 cm RV S prime:     10.30 cm/s TAPSE (M-mode): 3.2 cm LEFT ATRIUM             Index       RIGHT ATRIUM           Index LA diam:        4.80 cm 2.25 cm/m  RA Area:     15.70 cm LA Vol (A2C):   68.8 ml 32.20 ml/m RA Volume:   40.00 ml  18.72 ml/m LA Vol (A4C):   77.9 ml 36.45 ml/m LA Biplane Vol: 75.1 ml 35.14 ml/m  AORTIC VALVE                   PULMONIC VALVE AV Area (Vmax):    2.41 cm    PV Vmax:        0.99 m/s AV Area (Vmean):   2.15 cm    PV Peak grad:   3.9 mmHg AV Area (VTI):     2.54  cm    RVOT Peak grad: 4 mmHg AV Vmax:           144.50 cm/s AV Vmean:          94.950 cm/s AV VTI:            0.249 m AV Peak Grad:      8.4 mmHg AV Mean Grad:      4.5 mmHg LVOT Vmax:         111.00 cm/s LVOT Vmean:        65.100 cm/s LVOT VTI:          0.201 m LVOT/AV VTI ratio: 0.81  AORTA Ao Root diam: 2.90 cm MITRAL VALVE                TRICUSPID VALVE MV Area (PHT): 5.13 cm     TR Peak grad:  45.4 mmHg MV Decel Time: 148 msec     TR Vmax:        337.00 cm/s MV E velocity: 129.00 cm/s MV A velocity: 68.40 cm/s   SHUNTS MV E/A ratio:  1.89         Systemic VTI:  0.20 m                             Systemic Diam: 2.00 cm Kathlyn Sacramento MD Electronically signed by Kathlyn Sacramento MD Signature Date/Time: 03/15/2019/11:27:06 AM    Final    ECHOCARDIOGRAM COMPLETE  Result Date: 03/04/2019    ECHOCARDIOGRAM REPORT   Patient Name:   KANEN MOTTOLA Date of Exam: 03/04/2019 Medical Rec #:  [id number]       Height:       72.0 in Accession #:    [id number]      Weight:       185.0 lb Date of Birth:  September 11, 1955       BSA:          2.061 m Patient Age:    62 years        BP:           125/72 mmHg Patient Gender: M               HR:           119 bpm. Exam Location:  [facility] Procedure: 2D Echo, Color Doppler and Cardiac Doppler Indications:     I21.4 NSTEMI  History:         Patient has prior history of Echocardiogram examinations, most                  recent 08/24/2018. CAD, Prior CABG; Risk Factors:Hypertension,                  Dyslipidemia and Diabetes. Lung Cancer.  Sonographer:     Charmayne Sheer RDCS (AE) Referring Phys:  [place] Phys: Kathlyn Sacramento MD IMPRESSIONS  1. Left ventricular ejection fraction, by estimation, is 30 to 35%. The left ventricle has moderately decreased function. The left ventricle demonstrates global hypokinesis. Left ventricular diastolic parameters are indeterminate.  2. Right ventricular systolic function is normal. The right ventricular size is normal. Tricuspid  regurgitation signal is inadequate for assessing PA pressure.  3. Left atrial size was mild to moderately dilated.  4. The mitral valve is normal in structure and function. Moderate mitral valve regurgitation. No evidence of mitral stenosis.  5. The aortic valve is normal in structure and function. Aortic valve regurgitation is trivial. Mild to moderate aortic valve sclerosis/calcification is present, without any evidence of aortic stenosis.  6. The inferior vena cava is normal in size with greater than 50% respiratory variability, suggesting right atrial pressure of 3 mmHg. FINDINGS  Left Ventricle: Left ventricular ejection fraction, by estimation, is 30 to 35%. The left ventricle has moderately decreased function. The left ventricle demonstrates global hypokinesis. The left ventricular internal cavity size was normal in size. There is no left ventricular hypertrophy. Left ventricular diastolic parameters are indeterminate. Right Ventricle: The right ventricular size is normal. No increase in right ventricular wall thickness. Right ventricular systolic function is normal. Tricuspid regurgitation signal is inadequate for assessing PA pressure. Left Atrium: Left atrial size was mild to moderately dilated. Right Atrium: Right atrial size was normal in size. Pericardium: There is no evidence of pericardial effusion. Mitral  Valve: The mitral valve is normal in structure and function. Normal mobility of the mitral valve leaflets. Moderate mitral valve regurgitation. No evidence of mitral valve stenosis. MV peak gradient, 7.5 mmHg. The mean mitral valve gradient is 4.0  mmHg. Tricuspid Valve: The tricuspid valve is normal in structure. Tricuspid valve regurgitation is not demonstrated. No evidence of tricuspid stenosis. Aortic Valve: The aortic valve is normal in structure and function. Aortic valve regurgitation is trivial. Aortic regurgitation PHT measures 281 msec. Mild to moderate aortic valve sclerosis/calcification  is present, without any evidence of aortic stenosis. Aortic valve mean gradient measures 5.0 mmHg. Aortic valve peak gradient measures 8.5 mmHg. Aortic valve area, by VTI measures 2.53 cm. Pulmonic Valve: The pulmonic valve was normal in structure. Pulmonic valve regurgitation is not visualized. No evidence of pulmonic stenosis. Aorta: The aortic root is normal in size and structure. Venous: The inferior vena cava is normal in size with greater than 50% respiratory variability, suggesting right atrial pressure of 3 mmHg. IAS/Shunts: No atrial level shunt detected by color flow Doppler.  LEFT VENTRICLE PLAX 2D LVIDd:         5.21 cm  Diastology LVIDs:         4.21 cm  LV e' lateral:   7.83 cm/s LV PW:         0.84 cm  LV E/e' lateral: 13.4 LV IVS:        0.89 cm  LV e' medial:    7.94 cm/s LVOT diam:     2.40 cm  LV E/e' medial:  13.2 LV SV:         54 LV SV Index:   26 LVOT Area:     4.52 cm  RIGHT VENTRICLE RV Basal diam:  3.08 cm LEFT ATRIUM             Index       RIGHT ATRIUM           Index LA diam:        4.70 cm 2.28 cm/m  RA Area:     14.10 cm LA Vol (A2C):   62.0 ml 30.08 ml/m RA Volume:   35.20 ml  17.08 ml/m LA Vol (A4C):   68.6 ml 33.29 ml/m LA Biplane Vol: 65.3 ml 31.68 ml/m  AORTIC VALVE                    PULMONIC VALVE AV Area (Vmax):    2.95 cm     PV Vmax:       1.10 m/s AV Area (Vmean):   2.86 cm     PV Vmean:      68.400 cm/s AV Area (VTI):     2.53 cm     PV VTI:        0.146 m AV Vmax:           146.00 cm/s  PV Peak grad:  4.8 mmHg AV Vmean:          103.000 cm/s PV Mean grad:  2.0 mmHg AV VTI:            0.213 m AV Peak Grad:      8.5 mmHg AV Mean Grad:      5.0 mmHg LVOT Vmax:         95.30 cm/s LVOT Vmean:        65.200 cm/s LVOT VTI:          0.119 m LVOT/AV VTI ratio: 0.56 AI  PHT:            281 msec  AORTA Ao Root diam: 3.00 cm MITRAL VALVE MV Area (PHT): 3.92 cm     SHUNTS MV Peak grad:  7.5 mmHg     Systemic VTI:  0.12 m MV Mean grad:  4.0 mmHg     Systemic Diam: 2.40 cm MV  Vmax:       1.37 m/s MV Vmean:      92.7 cm/s MV Decel Time: 194 msec MV E velocity: 104.80 cm/s Kathlyn Sacramento MD Electronically signed by Kathlyn Sacramento MD Signature Date/Time: 03/04/2019/2:46:43 PM    Final    [place] RT UPPER EXTREM LTD SOFT TISSUE NON VASCULAR  Result Date: 03/08/2019 CLINICAL DATA:  Swelling and bruising of the right upper extremity. Possible hematoma. EXAM: ULTRASOUND right UPPER EXTREMITY LIMITED TECHNIQUE: Ultrasound examination of the upper extremity soft tissues was performed in the area of clinical concern. COMPARISON:  None. FINDINGS: Diffuse subcutaneous soft tissue swelling/edema and streaky fluid. No discrete fluid collection or hematoma. The underlying musculature is grossly normal. IMPRESSION: Diffuse subcutaneous soft tissue swelling/edema/streaky fluid but no discrete abscess or hematoma. Electronically Signed   By: Marijo Sanes M.D.   On: 03/08/2019 19:13   US THORACENTESIS ASP PLEURAL SPACE W/IMG GUIDE  Result Date: 03/29/2019 INDICATION: Pleural effusion. EXAM: ULTRASOUND GUIDED RIGHT THORACENTESIS MEDICATIONS: None. COMPLICATIONS: None immediate. PROCEDURE: An ultrasound guided thoracentesis was thoroughly discussed with the patient and questions answered. The benefits, risks, alternatives and complications were also discussed. The patient understands and wishes to proceed with the procedure. Written consent was obtained. Ultrasound was performed to localize and mark an adequate pocket of fluid in the right chest. The area was then prepped and draped in the normal sterile fashion. 1% Lidocaine was used for local anesthesia. Under ultrasound guidance a 6 French catheter was introduced. Thoracentesis was performed. The catheter was removed and a dressing applied. FINDINGS: A total of approximately 1 L of yellow fluid was removed. Samples were sent to the laboratory as requested by the clinical team. IMPRESSION: Successful ultrasound guided right thoracentesis yielding 1 L of  pleural fluid. Electronically Signed   By: Marcello Moores  Register   On: 03/29/2019 15:04   US THORACENTESIS ASP PLEURAL SPACE W/IMG GUIDE  Result Date: 03/15/2019 INDICATION: History of lung cancer with recurrent right pleural effusion. Request for diagnostic and therapeutic thoracentesis. EXAM: ULTRASOUND GUIDED RIGHT THORACENTESIS MEDICATIONS: 1% lidocaine 10 mL COMPLICATIONS: None immediate. PROCEDURE: An ultrasound guided thoracentesis was thoroughly discussed with the patient and questions answered. The benefits, risks, alternatives and complications were also discussed. The patient understands and wishes to proceed with the procedure. Written consent was obtained. Ultrasound was performed to localize and mark an adequate pocket of fluid in the right chest. The area was then prepped and draped in the normal sterile fashion. 1% Lidocaine was used for local anesthesia. Under ultrasound guidance a 6 Fr Safe-T-Centesis catheter was introduced. Thoracentesis was performed. The catheter was removed and a dressing applied. FINDINGS: A total of approximately 2.7 L of clear yellow fluid was removed. Samples were sent to the laboratory as requested by the clinical team. IMPRESSION: Successful ultrasound guided right thoracentesis yielding 2.7 L of pleural fluid. No pneumothorax on post-procedure chest x-ray. Read by: Gareth Eagle, PA-C Electronically Signed   By: Sandi Mariscal M.D.   On: 03/15/2019 12:44     ASSESSMENT & PLAN:  1. Malignant neoplasm of unspecified part of unspecified bronchus or lung ([place])   2.  Encounter for antineoplastic immunotherapy   3. Fatigue, unspecified type   4. 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 BRAF targeted therapy was discontinued due to patient's recent acute illness including acute CHF, DKA, renal failure on hemodialysis.  Encourage patient to follow-up with nephrology to  see if he needs long-term hemodialysis. Labs reviewed and discussed with patient. Creatinine continues to improve. He also has good urine output.  Nausea, continue antiemetics Phenergan 25 mg every 6 hours as needed. Symptoms are better controlled. Discussed with patient about single agent immunotherapy. Patient has PD-L1 8%, he may or may not respond to immunotherapy monotherapy. BRAF targeted therapies are contraindicated until his CHF recovers to near baseline. He is not interested in chemotherapy and I think he is currently not a candidate due to poor performance status. Patient prefers to hold off any treatment at this point. He would like to wait another 2 weeks and see how he does and then decide. Goal of care was discussed with patient. Palliative intent    Return visit, 2 weeks.   Earlie Server, MD, PhD Hematology Oncology [place] [facility] 04/17/2019

## 2019-04-18 ENCOUNTER — Other Ambulatory Visit: Payer: Medicare Other | Admitting: Adult Health Nurse Practitioner

## 2019-04-18 DIAGNOSIS — C3482 Malignant neoplasm of overlapping sites of left bronchus and lung: Secondary | ICD-10-CM

## 2019-04-18 DIAGNOSIS — Z515 Encounter for palliative care: Secondary | ICD-10-CM

## 2019-04-18 DIAGNOSIS — N179 Acute kidney failure, unspecified: Secondary | ICD-10-CM | POA: Diagnosis not present

## 2019-04-18 DIAGNOSIS — N184 Chronic kidney disease, stage 4 (severe): Secondary | ICD-10-CM | POA: Diagnosis not present

## 2019-04-18 DIAGNOSIS — N189 Chronic kidney disease, unspecified: Secondary | ICD-10-CM | POA: Diagnosis not present

## 2019-04-18 NOTE — Progress Notes (Signed)
[facility] Consult Note Telephone: [phone]  Fax: [phone]  PATIENT NAME: Craig Koch DOB: Apr 05, 1955 MRN: [id number]  PRIMARY CARE PROVIDER:   Tonia Ghent, MD  REFERRING PROVIDER:  Tonia Ghent, MD [place],  [place] [address]  RESPONSIBLE PARTY:   Self and wife, Georga Kaufmann [phone] or [phone]    RECOMMENDATIONS and PLAN:  1.  Advanced care planning. Patient has MOST form uploaded to Epic.  MOST indicates DNR, limited hospital interventions, antibiotics and IV fluids as indicated, and no feeding tube.  Patient has copy in the home.  2.  Stage IV lung cancer with mets.  Patient started having pleural effusions August 2020.  In September 2020 diagnosed with lung cancer.  Patient underwent treatment and states feeling better for the first few months.  Was hospitalized 2/28-3/16/2021 for NSTEMI, DKA, and AKI.  Kidney injury required him to be started on hemodialysis.  He is still on hemodialysis and states that today his nephrologist will determine if he needs to continue on this or not.  Lab work at oncologist yesterday was reassuring that his kidney function may be returning back to normal with a creatinine of 1.64, BUN of 15 and GFR of 44 (up from 32 on 04/08/19).  Patient also hospitalized on 3/23-3/29/2021 for CHF exacerbation and recurrent right pleural effusion.  Currently is not undergoing treatment as he has poor performance status.  States that he would like to continue with therapy if he can get off the dialysis.  Continue follow up and recommendations by oncology and nephrology.    3.  Functional status.  Patient walks with a walker and gets short of breath when having to walk from the house to the car.  When going out to appointments he has to use a wheelchair as he cannot walk long distances.  States that he was tested for home oxygen when in the hospital but did not qualify for it.  He is able to  perform ADLs and minimally help around the house.  Discussed conservation of energy and staying mobile by doing chair exercises as this does not tire him out or effect his breathing as much as walking.  After hospitalization on 03/19/19 he went to SNF for rehab and shortly was back in the hospital.  He chose to go home from there.  Wife states that insurance denied home health therapy.  Continue activity as tolerated and conserve energy especially on days having to go to appointments  4.  Appetite.  Patient has lost about 50 pounds since diagnosis of lung cancer.  His starting weight was 220 and he currently weighs 170.  States his appetite comes and goes.  Has promethazine for nausea which helps.  Denies abdominal pain, diarrhea, constipation.  Was started on Marinol and states that he feels that his appetite is improving some.  He is a diabetic and his insulin has been decreased to low blood sugars.  He monitors his blood sugars frequently throughout the day and does not take his levemir if his blood sugar in normal or low.  Keeps snacks on hand for when his blood sugar does get low.  Continue marinol and monitor for effectiveness.  Continue frequent monitoring of blood sugars and make adjustments to insulin as needed.    Palliative will continue to monitor for symptom management/decline and make recommendations as needed.  Will call in 2 weeks for follow up  I spent 90 minutes providing  this consultation,  from 9:00 to 10:30 including time spent with patient/family, chart review, provider coordination, documentation. More than 50% of the time in this consultation was spent coordinating communication.   HISTORY OF PRESENT ILLNESS:  Craig Koch is a 64 y.o. year old male with multiple medical problems including stage IV lung cancer with mets, CHF, CAD, HTN, DMT2. Palliative Care was asked to help address goals of care.   CODE STATUS: DNR  PPS: 50% HOSPICE ELIGIBILITY/DIAGNOSIS: TBD  PHYSICAL EXAM:   BP  110/52  HR  72  O2 98% on RA General: NAD, frail appearing, thin Cardiovascular: regular rate and rhythm Pulmonary: lung sounds clear; normal respiratory effort Abdomen: soft, nontender, + bowel sounds GU: no suprapubic tenderness Extremities: no edema, no joint deformities Skin: no rashes on exposed skin Neurological: Weakness but otherwise nonfocal   PAST MEDICAL HISTORY:  Past Medical History:  Diagnosis Date  . Anxiety   . Arthritis   . CHF (congestive heart failure) ([facility])   . Coronary artery disease   . Diabetes mellitus   . Dyslipidemia   . Hx of CABG   . Hyperlipidemia   . Hypertension   . Malignant neoplasm of unspecified part of unspecified bronchus or lung ([facility]) 08/2018   Immunotherapy  . Pleural effusion     SOCIAL HX:  Social History   Tobacco Use  . Smoking status: Former Smoker    Packs/day: 0.20    Years: 45.00    Pack years: 9.00    Types: Cigarettes    Quit date: 03/05/2019    Years since quitting: 0.1  . Smokeless tobacco: Former Systems developer    Types: Snuff  Substance Use Topics  . Alcohol use: Yes    Alcohol/week: 6.0 standard drinks    Types: 6 Cans of beer per week    Comment: occ, average 6 pack in a week    ALLERGIES:  Allergies  Allergen Reactions  . Lipitor [Atorvastatin Calcium] Other (See Comments)    Aches.  Tolerated crestor.      PERTINENT MEDICATIONS:  Outpatient Encounter Medications as of 04/18/2019  Medication Sig  . acetaminophen (TYLENOL) 325 MG tablet Take 2 tablets (650 mg total) by mouth every 6 (six) hours as needed for mild pain or fever.  [name] albuterol (VENTOLIN HFA) 108 (90 Base) MCG/ACT inhaler Inhale 2 puffs into the lungs every 6 (six) hours as needed for wheezing or shortness of breath.  [name] amiodarone (PACERONE) 200 MG tablet Take 1 tablet (200 mg total) by mouth daily.  . clopidogrel (PLAVIX) 75 MG tablet Take 1 tablet (75 mg total) by mouth daily.  [name] dextromethorphan-guaiFENesin (MUCINEX DM) 30-600 MG 12hr tablet  Take 1 tablet by mouth 2 (two) times daily.  [name] dronabinol (MARINOL) 5 MG capsule Take 1 capsule (5 mg total) by mouth 2 (two) times daily before a meal.  . DULoxetine (CYMBALTA) 30 MG capsule Take 1 capsule (30 mg total) by mouth daily.  . Ensure Max Protein (ENSURE MAX PROTEIN) LIQD Take 330 mLs (11 oz total) by mouth 2 (two) times daily between meals.  . ferrous sulfate 325 (65 FE) MG tablet Take 1 tablet (325 mg total) by mouth 2 (two) times daily with a meal.  . folic acid (FOLVITE) 1 MG tablet Take 1 tablet (1 mg total) by mouth daily.  . insulin aspart (NOVOLOG) 100 UNIT/ML injection Inject 0-8 Units into the skin as directed. Take 0 units if CBG 70-150 Take 1 units if CBG 151-200 Take  2 units if CBG 201-250 Take 3 units if CBG 251-300 Take 4 units if CBG 301-350 Take 6 units if CBG 351-400 If CBG > 400, give 8 units and call MD  . insulin detemir (LEVEMIR) 100 UNIT/ML injection Inject 0.1-0.15 mLs (10-15 Units total) into the skin at bedtime. (Patient not taking: Reported on 04/17/2019)  . metoprolol succinate (TOPROL-XL) 25 MG 24 hr tablet Take 0.5 tablets (12.5 mg total) by mouth daily.  . multivitamin (RENA-VIT) TABS tablet Take 1 tablet by mouth at bedtime.  . polyethylene glycol (MIRALAX / GLYCOLAX) 17 g packet Take 17 g by mouth daily.  . promethazine (PHENERGAN) 25 MG tablet Take 1 tablet (25 mg total) by mouth every 6 (six) hours as needed for nausea or vomiting.  . rosuvastatin (CRESTOR) 10 MG tablet TAKE 1 TABLET (10 MG TOTAL) BY MOUTH DAILY.  . simethicone (MYLICON) 80 MG chewable tablet Chew 1 tablet (80 mg total) by mouth 4 (four) times daily as needed for flatulence.  . thiamine 100 MG tablet Take 1 tablet (100 mg total) by mouth daily.   No facility-administered encounter medications on file as of 04/18/2019.     Edia Pursifull Jenetta Downer, NP

## 2019-04-20 DIAGNOSIS — N184 Chronic kidney disease, stage 4 (severe): Secondary | ICD-10-CM | POA: Diagnosis not present

## 2019-04-20 DIAGNOSIS — N189 Chronic kidney disease, unspecified: Secondary | ICD-10-CM | POA: Diagnosis not present

## 2019-04-20 DIAGNOSIS — N179 Acute kidney failure, unspecified: Secondary | ICD-10-CM | POA: Diagnosis not present

## 2019-04-23 ENCOUNTER — Other Ambulatory Visit: Payer: Self-pay | Admitting: *Deleted

## 2019-04-23 DIAGNOSIS — N179 Acute kidney failure, unspecified: Secondary | ICD-10-CM | POA: Diagnosis not present

## 2019-04-23 DIAGNOSIS — D509 Iron deficiency anemia, unspecified: Secondary | ICD-10-CM

## 2019-04-23 DIAGNOSIS — N184 Chronic kidney disease, stage 4 (severe): Secondary | ICD-10-CM | POA: Diagnosis not present

## 2019-04-23 DIAGNOSIS — N189 Chronic kidney disease, unspecified: Secondary | ICD-10-CM | POA: Diagnosis not present

## 2019-04-23 MED ORDER — AMIODARONE HCL 200 MG PO TABS: 200.0000 mg | ORAL_TABLET | Freq: Every day | ORAL | 0 refills | Status: DC

## 2019-04-23 MED ORDER — CLOPIDOGREL BISULFATE 75 MG PO TABS: 75.0000 mg | ORAL_TABLET | Freq: Every day | ORAL | 0 refills | Status: AC

## 2019-04-23 MED ORDER — FERROUS SULFATE 325 (65 FE) MG PO TABS: 325.0000 mg | ORAL_TABLET | Freq: Two times a day (BID) | ORAL | 0 refills | Status: DC

## 2019-04-23 NOTE — Telephone Encounter (Signed)
Patient advised.

## 2019-04-23 NOTE — Telephone Encounter (Signed)
I sent the rxs but I need the f/u amiodarone rx after this to come through cardiology.  I routed this to Dr. Rockey Situ as [facility] and for input.  Thanks.

## 2019-04-23 NOTE — Telephone Encounter (Signed)
Patient's wife called stating that he was given 3 prescriptions when he was in the hospital and needs them refilled by Dr. Damita Dunnings. Patient's wife stated that he is out of one of the mediations and only has a few of the others. Patient's wife requested that they be sent in today for a 90 days supply. Mrs.  Biehl stated that the pharmacy told her that she would need to call the office and get these scripts change to Dr. Damita Dunnings. Amiodarone  LR 04/01/19 #30 Ferrous sulfate LR 04/01/19 #60 Plavix LR 04/01/19 #30 Last office visit 04/12/19

## 2019-04-24 ENCOUNTER — Telehealth: Payer: Self-pay

## 2019-04-24 NOTE — Telephone Encounter (Signed)
Nutrition Assessment   Reason for Assessment: Referral from [place], NP for poor appetite, weight loss   ASSESSMENT:  64 year old male with stage IV lung cancer with metastatic disease.  Past medical history of DM, CHF, CAD, HLD, HTN.  Noted 2 hospital admissions, started on hemodialysis but maybe able to come off soon.  Home Palliative care is following.   Spoke with patient via phone for nutrition assessment.  Patient reports that for the past 4-5 days appetite has increased but prior to that no appetite.  Reports that he usually eats a 2 egg omelet with cheese and vegetables with grits and bacon for breakfast.  Lunch today was baked chicken, steamed vegetables and 1/2 sweet potato.  Supper last night was steak, steamed vegetables and dressing.  Has not been drinking oral nutrition supplements as too sweet and thick.     Medications: marinol   Labs: reviewed   Anthropometrics:   Height: 72 inches Weight: 170 lb on 4/14 UBW: 220-225 prior to diagnosis 10/2018 BMI: 23  23% weight loss in the last 6 months, significant  Estimated Energy Needs  Kcals: 2300-2600 Protein: 115-130 g Fluid: > 2 L   NUTRITION DIAGNOSIS: Inadequate oral intake related to cancer and cancer related treatment side effects, recent hospital visits as evidenced by 23% weight loss in the last 6 months and poor appetite   MALNUTRITION DIAGNOSIS: Likely with significant weight loss and poor appetite   INTERVENTION:  Discussed importance of good nutrition during treatment.  Encouraged patient to continue eating well-balanced diet including good sources o protein.   Continue marinol. Provided patient with contact information   MONITORING, EVALUATION, GOAL: Patient will consume adequate calories and protein to prevent weight loss   Next Visit: phone f/u May 13  Jacon Whetzel B. Zenia Resides, [place], [place] Registered Dietitian [phone] (pager)

## 2019-04-25 DIAGNOSIS — N184 Chronic kidney disease, stage 4 (severe): Secondary | ICD-10-CM | POA: Diagnosis not present

## 2019-04-25 DIAGNOSIS — N189 Chronic kidney disease, unspecified: Secondary | ICD-10-CM | POA: Diagnosis not present

## 2019-04-25 DIAGNOSIS — N179 Acute kidney failure, unspecified: Secondary | ICD-10-CM | POA: Diagnosis not present

## 2019-04-27 DIAGNOSIS — N179 Acute kidney failure, unspecified: Secondary | ICD-10-CM | POA: Diagnosis not present

## 2019-04-27 DIAGNOSIS — N189 Chronic kidney disease, unspecified: Secondary | ICD-10-CM | POA: Diagnosis not present

## 2019-04-27 DIAGNOSIS — N184 Chronic kidney disease, stage 4 (severe): Secondary | ICD-10-CM | POA: Diagnosis not present

## 2019-04-30 DIAGNOSIS — N184 Chronic kidney disease, stage 4 (severe): Secondary | ICD-10-CM | POA: Diagnosis not present

## 2019-04-30 DIAGNOSIS — N179 Acute kidney failure, unspecified: Secondary | ICD-10-CM | POA: Diagnosis not present

## 2019-04-30 DIAGNOSIS — N189 Chronic kidney disease, unspecified: Secondary | ICD-10-CM | POA: Diagnosis not present

## 2019-05-01 ENCOUNTER — Inpatient Hospital Stay (HOSPITAL_BASED_OUTPATIENT_CLINIC_OR_DEPARTMENT_OTHER): Payer: Medicare Other | Admitting: Oncology

## 2019-05-01 ENCOUNTER — Inpatient Hospital Stay: Payer: Medicare Other

## 2019-05-01 ENCOUNTER — Other Ambulatory Visit: Payer: Self-pay

## 2019-05-01 ENCOUNTER — Ambulatory Visit
Admission: RE | Admit: 2019-05-01 | Discharge: 2019-05-01 | Disposition: A | Discharge status: Home / Self Care | Payer: Medicare Other | Source: Ambulatory Visit | Attending: Hospice and Palliative Medicine | Admitting: Hospice and Palliative Medicine

## 2019-05-01 ENCOUNTER — Ambulatory Visit
Admission: RE | Admit: 2019-05-01 | Discharge: 2019-05-01 | Disposition: A | Discharge status: Home / Self Care | Payer: Medicare Other | Attending: Hospice and Palliative Medicine | Admitting: Hospice and Palliative Medicine

## 2019-05-01 ENCOUNTER — Inpatient Hospital Stay (HOSPITAL_BASED_OUTPATIENT_CLINIC_OR_DEPARTMENT_OTHER): Payer: Medicare Other | Admitting: Hospice and Palliative Medicine

## 2019-05-01 ENCOUNTER — Encounter: Payer: Self-pay | Admitting: Oncology

## 2019-05-01 VITALS — BP 103/63 | HR 84 | Temp 95.5°F | Resp 18 | Wt 171.0 lb

## 2019-05-01 DIAGNOSIS — J910 Malignant pleural effusion: Secondary | ICD-10-CM | POA: Diagnosis not present

## 2019-05-01 DIAGNOSIS — Z515 Encounter for palliative care: Secondary | ICD-10-CM | POA: Diagnosis not present

## 2019-05-01 DIAGNOSIS — R0602 Shortness of breath: Secondary | ICD-10-CM | POA: Diagnosis not present

## 2019-05-01 DIAGNOSIS — C3490 Malignant neoplasm of unspecified part of unspecified bronchus or lung: Secondary | ICD-10-CM

## 2019-05-01 DIAGNOSIS — R531 Weakness: Secondary | ICD-10-CM | POA: Diagnosis not present

## 2019-05-01 DIAGNOSIS — I132 Hypertensive heart and chronic kidney disease with heart failure and with stage 5 chronic kidney disease, or end stage renal disease: Secondary | ICD-10-CM | POA: Diagnosis not present

## 2019-05-01 DIAGNOSIS — Z7189 Other specified counseling: Secondary | ICD-10-CM | POA: Diagnosis not present

## 2019-05-01 DIAGNOSIS — J449 Chronic obstructive pulmonary disease, unspecified: Secondary | ICD-10-CM | POA: Diagnosis not present

## 2019-05-01 DIAGNOSIS — Z992 Dependence on renal dialysis: Secondary | ICD-10-CM | POA: Diagnosis not present

## 2019-05-01 DIAGNOSIS — E1136 Type 2 diabetes mellitus with diabetic cataract: Secondary | ICD-10-CM | POA: Diagnosis not present

## 2019-05-01 DIAGNOSIS — Z87891 Personal history of nicotine dependence: Secondary | ICD-10-CM | POA: Diagnosis not present

## 2019-05-01 DIAGNOSIS — I509 Heart failure, unspecified: Secondary | ICD-10-CM | POA: Diagnosis not present

## 2019-05-01 LAB — COMPREHENSIVE METABOLIC PANEL
ALT: 14 U/L (ref 0–44)
AST: 16 U/L (ref 15–41)
Albumin: 2.9 g/dL — ABNORMAL LOW (ref 3.5–5.0)
Alkaline Phosphatase: 117 U/L (ref 38–126)
Anion gap: 11 (ref 5–15)
BUN: 19 mg/dL (ref 8–23)
CO2: 28 mmol/L (ref 22–32)
Calcium: 8.9 mg/dL (ref 8.9–10.3)
Chloride: 97 mmol/L — ABNORMAL LOW (ref 98–111)
Creatinine, Ser: 1.74 mg/dL — ABNORMAL HIGH (ref 0.61–1.24)
GFR calc Af Amer: 47 mL/min — ABNORMAL LOW (ref >60)
GFR calc non Af Amer: 41 mL/min — ABNORMAL LOW (ref >60)
Glucose, Bld: 93 mg/dL (ref 70–99)
Potassium: 4.4 mmol/L (ref 3.5–5.1)
Sodium: 136 mmol/L (ref 135–145)
Total Bilirubin: 0.5 mg/dL (ref 0.3–1.2)
Total Protein: 7.3 g/dL (ref 6.5–8.1)

## 2019-05-01 LAB — CBC WITH DIFFERENTIAL/PLATELET
Abs Immature Granulocytes: 0.02 10*3/uL (ref 0.00–0.07)
Basophils Absolute: 0.1 10*3/uL (ref 0.0–0.1)
Basophils Relative: 2 %
Eosinophils Absolute: 0.4 10*3/uL (ref 0.0–0.5)
Eosinophils Relative: 6 %
HCT: 31.1 % — ABNORMAL LOW (ref 39.0–52.0)
Hemoglobin: 10.2 g/dL — ABNORMAL LOW (ref 13.0–17.0)
Immature Granulocytes: 0 %
Lymphocytes Relative: 13 %
Lymphs Abs: 0.7 10*3/uL (ref 0.7–4.0)
MCH: 29.7 pg (ref 26.0–34.0)
MCHC: 32.8 g/dL (ref 30.0–36.0)
MCV: 90.7 fL (ref 80.0–100.0)
Monocytes Absolute: 0.7 10*3/uL (ref 0.1–1.0)
Monocytes Relative: 13 %
Neutro Abs: 3.9 10*3/uL (ref 1.7–7.7)
Neutrophils Relative %: 66 %
Platelets: 340 10*3/uL (ref 150–400)
RBC: 3.43 MIL/uL — ABNORMAL LOW (ref 4.22–5.81)
RDW: 17.1 % — ABNORMAL HIGH (ref 11.5–15.5)
WBC: 5.9 10*3/uL (ref 4.0–10.5)
nRBC: 0 % (ref 0.0–0.2)

## 2019-05-01 NOTE — Telephone Encounter (Signed)
Attempted to schedule no ans no vm  

## 2019-05-01 NOTE — Progress Notes (Signed)
[facility]  Telephone:([phone] [phone]   Name: Craig Koch Date: 05/01/2019 MRN: [id number]  DOB: 1955/08/26  Patient Care Team: Tonia Ghent, MD as PCP - General (Family Medicine) Thelma Comp, [place] (Optometry) Telford Nab, RN as Registered Nurse    REASON FOR CONSULTATION: Craig Koch is a 64 y.o. male with multiple medical problems including stage IV adenocarcinoma of the lung with history of left sided malignant pleural effusion requiring Pleurx(later removed). Patient has opted not to pursue chemotherapy and has instead been treated on immunotherapy. Patient has had persistent shortness of breath. CTA of the chest on 11/02/2018 revealed marked increase in tumor encasing the entire left lung with new diffuse increased interstitial thickening concerning for disease progression and lymphangitic spread of disease.Repeat CT of the chest and abdomen on 01/22/2019 revealed significant interval decreaseoftumor burden on the lung. Patient was admitted to the hospital on 03/03/2019 with shortness of breath and weakness. He was found to have non-STEMI and DKA. Patient was referred to palliative care to help address goals and manage ongoing symptoms..    SOCIAL HISTORY:     reports that he quit smoking about 8 weeks ago. His smoking use included cigarettes. He has a 9.00 pack-year smoking history. He has quit using smokeless tobacco.  His smokeless tobacco use included snuff. He reports current alcohol use of about 6.0 standard drinks of alcohol per week. He reports that he does not use drugs.   Patient is married. He lives at home with his wife. He has a son and daughter who live nearby. Patient used to work as a Theme park manager.  ADVANCE DIRECTIVES:  Not on file  CODE STATUS: DNR/DNI (MOST form completed on 01/11/2019)  PAST MEDICAL HISTORY: Past Medical History:  Diagnosis Date  . Anxiety   . Arthritis     . CHF (congestive heart failure) ([facility])   . Coronary artery disease   . Diabetes mellitus   . Dyslipidemia   . Hx of CABG   . Hyperlipidemia   . Hypertension   . Malignant neoplasm of unspecified part of unspecified bronchus or lung ([facility]) 08/2018   Immunotherapy  . Pleural effusion     PAST SURGICAL HISTORY:  Past Surgical History:  Procedure Laterality Date  . CATARACT EXTRACTION Left 09/2015  . CHEST TUBE INSERTION Left 10/01/2018   Procedure: INSERTION PLEURAL DRAINAGE CATHETER;  Surgeon: [name], MD;  Location: [facility] ORS;  Service: Thoracic;  Laterality: Left;  . CORONARY ARTERY BYPASS GRAFT  2004   (CABG with LIMA to the  LAD, SVG to OM2/OM3, SVG  to diag  . DIALYSIS/PERMA CATHETER INSERTION N/A 03/14/2019   Procedure: DIALYSIS/PERMA CATHETER INSERTION;  Surgeon: Algernon Huxley, MD;  Location: [facility];  Service: Cardiovascular;  Laterality: N/A;  . Left ankle surgery     repair of fracture  . Right lower leg surgery     rod  . TEMPORARY DIALYSIS CATHETER N/A 03/11/2019   Procedure: TEMPORARY DIALYSIS CATHETER;  Surgeon: Algernon Huxley, MD;  Location: [facility];  Service: Cardiovascular;  Laterality: N/A;    HEMATOLOGY/ONCOLOGY HISTORY:  Oncology History  Malignant neoplasm of unspecified part of unspecified bronchus or lung ([facility])  09/08/2018 Initial Diagnosis   lung adenocarcinoma   10/19/2018 -  Chemotherapy   The patient had pembrolizumab (KEYTRUDA) 200 mg in sodium chloride 0.9 % 50 mL chemo infusion, 200 mg, Intravenous, Once, 2 of 8 cycles Administration: 200 mg (  10/19/2018), 200 mg (11/09/2018)  for chemotherapy treatment.      ALLERGIES:  is allergic to lipitor [atorvastatin calcium].  MEDICATIONS:  Current Outpatient Medications  Medication Sig Dispense Refill  . acetaminophen (TYLENOL) 325 MG tablet Take 2 tablets (650 mg total) by mouth every 6 (six) hours as needed for mild pain or fever.    [name] albuterol (VENTOLIN HFA) 108 (90 Base)  MCG/ACT inhaler Inhale 2 puffs into the lungs every 6 (six) hours as needed for wheezing or shortness of breath. 18 g 0  . amiodarone (PACERONE) 200 MG tablet Take 1 tablet (200 mg total) by mouth daily. 90 tablet 0  . clopidogrel (PLAVIX) 75 MG tablet Take 1 tablet (75 mg total) by mouth daily. 90 tablet 0  . dextromethorphan-guaiFENesin (MUCINEX DM) 30-600 MG 12hr tablet Take 1 tablet by mouth 2 (two) times daily.    [name] dronabinol (MARINOL) 5 MG capsule Take 1 capsule (5 mg total) by mouth 2 (two) times daily before a meal. 60 capsule 0  . DULoxetine (CYMBALTA) 30 MG capsule Take 1 capsule (30 mg total) by mouth daily. 30 capsule 0  . Ensure Max Protein (ENSURE MAX PROTEIN) LIQD Take 330 mLs (11 oz total) by mouth 2 (two) times daily between meals.    . ferrous sulfate 325 (65 FE) MG tablet Take 1 tablet (325 mg total) by mouth 2 (two) times daily with a meal. 315 tablet 0  . folic acid (FOLVITE) 1 MG tablet Take 1 tablet (1 mg total) by mouth daily.    . insulin aspart (NOVOLOG) 100 UNIT/ML injection Inject 0-8 Units into the skin as directed. Take 0 units if CBG 70-150 Take 1 units if CBG 151-200 Take 2 units if CBG 201-250 Take 3 units if CBG 251-300 Take 4 units if CBG 301-350 Take 6 units if CBG 351-400 If CBG > 400, give 8 units and call MD 10 mL   . insulin detemir (LEVEMIR) 100 UNIT/ML injection Inject 0.1-0.15 mLs (10-15 Units total) into the skin at bedtime.    . metoprolol succinate (TOPROL-XL) 25 MG 24 hr tablet Take 0.5 tablets (12.5 mg total) by mouth daily. 45 tablet 1  . multivitamin (RENA-VIT) TABS tablet Take 1 tablet by mouth at bedtime.  0  . polyethylene glycol (MIRALAX / GLYCOLAX) 17 g packet Take 17 g by mouth daily. 14 each 0  . promethazine (PHENERGAN) 25 MG tablet Take 1 tablet (25 mg total) by mouth every 6 (six) hours as needed for nausea or vomiting. 60 tablet 0  . rosuvastatin (CRESTOR) 10 MG tablet TAKE 1 TABLET (10 MG TOTAL) BY MOUTH DAILY. 90 tablet 3  .  simethicone (MYLICON) 80 MG chewable tablet Chew 1 tablet (80 mg total) by mouth 4 (four) times daily as needed for flatulence. 30 tablet 0  . thiamine 100 MG tablet Take 1 tablet (100 mg total) by mouth daily.     No current facility-administered medications for this visit.    VITAL SIGNS: There were no vitals taken for this visit. There were no vitals filed for this visit.  Estimated body mass index is 23.19 kg/m as calculated from the following:   Height as of 04/12/19: 6' (1.829 m).   Weight as of an earlier encounter on 05/01/19: 171 lb (77.6 kg).  LABS: CBC:    Component Value Date/Time   WBC 5.9 05/01/2019 0855   HGB 10.2 (L) 05/01/2019 0855   HCT 31.1 (L) 05/01/2019 0855   PLT 340 05/01/2019 0855  MCV 90.7 05/01/2019 0855   NEUTROABS 3.9 05/01/2019 0855   LYMPHSABS 0.7 05/01/2019 0855   MONOABS 0.7 05/01/2019 0855   EOSABS 0.4 05/01/2019 0855   BASOSABS 0.1 05/01/2019 0855   Comprehensive Metabolic Panel:    Component Value Date/Time   NA 136 05/01/2019 0855   K 4.4 05/01/2019 0855   CL 97 (L) 05/01/2019 0855   CO2 28 05/01/2019 0855   BUN 19 05/01/2019 0855   CREATININE 1.74 (H) 05/01/2019 0855   GLUCOSE 93 05/01/2019 0855   CALCIUM 8.9 05/01/2019 0855   AST 16 05/01/2019 0855   ALT 14 05/01/2019 0855   ALKPHOS 117 05/01/2019 0855   BILITOT 0.5 05/01/2019 0855   PROT 7.3 05/01/2019 0855   ALBUMIN 2.9 (L) 05/01/2019 0855    RADIOGRAPHIC STUDIES: No results found.  PERFORMANCE STATUS (ECOG) : 2 - Symptomatic, <50% confined to bed  Review of Systems Unless otherwise noted, a complete review of systems is negative.  Physical Exam General: NAD, frail appearing, in wheelchair Cardiovascular: regular rate and rhythm Pulmonary: clear ant fields, diminished left side Abdomen: soft, nontender, + bowel sounds GU: no suprapubic tenderness Extremities: no edema, no joint deformities Skin: no rashes Neurological: Weakness but otherwise  nonfocal  IMPRESSION: Routine follow-up visit.  Patient was accompanied by his wife.   He endorses some worsening exertional dyspnea.  He says he can take a few steps and is "gasping".  He has been using albuterol about every 6 hours, which he finds especially helpful.    Will obtain chest x-ray as it has been about a month since he had his last thoracentesis.  Will want to rule out recurrent pleural effusion.  It is possible that his dyspnea is secondary to his lung cancer.  In light of symptomatic improvement on as needed albuterol, would consider starting him on inhaler for longer acting control such as Symbicort.  Patient continues to receive hemodialysis.  However, he says that nephrology is discussing discontinuation.  He is supposed to have follow-up with them to make a final decision.  Discussed his general goals with cancer treatment.  Immunotherapy has been discussed as an option but patient is unsure if he would want to pursue it unless the prognosis was especially positive.  He will meet with Dr. Tasia Catchings later today to discuss the same.  We have discussed the option of hospice if patient decides ultimately to forego treatment.  PLAN: -Continue current scope of treatment -Chest x-ray today -Continue albuterol every 6 hours as needed for dyspnea -Consider starting Symbicort twice daily -Referral to OT for energy conservation techniques -Follow-up virtual visit with me in 2 weeks  Case and plan discussed with Dr. Tasia Catchings   Patient expressed understanding and was in agreement with this plan. He also understands that He can call the clinic at any time with any questions, concerns, or complaints.     Time Total: 20 minutes  Visit consisted of counseling and education dealing with the complex and emotionally intense issues of symptom management and palliative care in the setting of serious and potentially life-threatening illness.Greater than 50%  of this time was spent counseling and  coordinating care related to the above assessment and plan.  Signed by: Altha Harm, PhD, NP-C

## 2019-05-01 NOTE — Progress Notes (Signed)

## 2019-05-01 NOTE — Progress Notes (Signed)
Patient has good and bad days on his appetite with the past 2 days being good, 1 lb wt gain.  His SOBr symptom is getting worse and now feels like he has episodes of "gasping" for air.

## 2019-05-01 NOTE — Progress Notes (Signed)
Hematology/Oncologyfollow up  note [facility] [phone] [phone]   Patient Care Team: Tonia Ghent, MD as PCP - General (Family Medicine) Thelma Comp, [place] (Optometry) Telford Nab, RN as Registered Nurse  REFERRING PROVIDER: Tonia Ghent, MD  CHIEF COMPLAINTS/REASON FOR VISIT:  Follow-up for metastatic lung adenocarcinoma  HISTORY OF PRESENTING ILLNESS:   Craig Koch is a  64 y.o.  male with PMH listed below was seen in consultation at the request of  Tonia Ghent, MD  for evaluation of malignant pleural effusion. Patient was recently admitted to [facility] due to large amount of pleural effusion, acute hypoxic respiratory failure. Patient reports feeling shortness of breath and dyspnea on exertion for several weeks and presented to emergency room.  A chest x-ray 08/23/2018 showed a large amount of left pleural effusion 08/24/2018 CT chest abdomen pelvis confirmed large left pleural effusion with near complete collapse of the left lower lobe and extensive volume loss in the lingula and a portion of the left upper lobe. No acute intra-abdominal process.  Cholelithiasis, diverticulitis, coronary atherosclerosis post CABG.  Bilateral renal cortical scarring.  Aortic atherosclerosis.  #08/24/2018 ultrasound guided diagnostic and therapeutic thoracentesis removed 1.8 L of pleural fluid. Patient subsequently felt better and was discharged to follow-up with pulmonology. 09/04/2018 patient followed up with Dr.Aleskerov chest x-ray showed recurrent moderate left pleural effusion.  Patient underwent ultrasound-guided left thoracentesis again and it drained 3 L of fluid. Pleural fluid cytology positive for adenocarcinoma. Patient was referred to me for further evaluation and management.  # PET eft sided pleural hypermetabolic activity  consistent with malignant pleural effusion. Left upper and lower lobe low-level hypermetabolic him  corresponding to areas of airspace and groundglass opacity.  This was felt to be secondary to atelectasis.  Underlying left upper lobe primary bronchogenic carcinoma cannot be excluded. Medial left upper lobe area more focal hypermetabolic area could represent a pleural disease or a site of small left upper lobe primary. No evidence of extra thoracic hypermetabolic metastatic disease.  Left sided hydro-pneumothorax within the pleural air component being new since the prior CT. Cholelithiasis.  #MRI brain showed punctate focus of enhancement within the right parietal lobe cortex suspicious for possible metastasis.  Molecular study showed BRAF V600 mutation. Treated with BRAF targeted therapies. He had good response  #Patient was admitted from 03/03/2019-03/19/2019 due to DKA due to uncontrolled diabetes, acute decompensation of CHF left lower lobe pneumonia right pleural effusion status post thoracentesis.  Fluid study was consistent with transudate. AKI secondary to ATN, on hemodialysis Tuesday Thursday and Saturday, atrial fibrillation with RVR, paroxysmal.  Patient was discharged on Eliquis and amiodarone. Patient was readmitted from 03/26/2019-04/01/2019 due to CHF exacerbation, right pleural effusion recurrent.  Status post diagnostic and therapeutic thoracentesis.  Cytology was negative for malignant cells.   INTERVAL HISTORY Craig Koch is a 64 y.o. male who has above history reviewed by me today presents for follow-up of lung cancer Problems and complaints are listed below:  Patient was accompanied by his wife today.  Patient reports that he has good and bad days. On hemodialysis pending decision from nephrologist to see if he will be able to come off hemodialysis. He has good urine output.  He has good urine output. Shortness of breath with exertion.  No shortness of breath at rest. Denies any cough.  Weight has been stable since 2 weeks ago.      Review of Systems   Constitutional: Positive for appetite change and  fatigue. Negative for chills, fever and unexpected weight change.  HENT:   Negative for hearing loss and voice change.   Eyes: Negative for eye problems and icterus.  Respiratory: Positive for shortness of breath. Negative for chest tightness and cough.   Cardiovascular: Negative for chest pain and leg swelling.  Gastrointestinal: Negative for abdominal distention, abdominal pain and nausea.  Endocrine: Negative for hot flashes.  Genitourinary: Negative for difficulty urinating, dysuria and frequency.   Musculoskeletal: Negative for arthralgias.  Skin: Negative for itching and rash.  Neurological: Negative for light-headedness and numbness.  Hematological: Negative for adenopathy. Does not bruise/bleed easily.  Psychiatric/Behavioral: Negative for confusion.    MEDICAL HISTORY:  Past Medical History:  Diagnosis Date  . Anxiety   . Arthritis   . CHF (congestive heart failure) ([place])   . Coronary artery disease   . Diabetes mellitus   . Dyslipidemia   . Hx of CABG   . Hyperlipidemia   . Hypertension   . Malignant neoplasm of unspecified part of unspecified bronchus or lung ([place]) 08/2018   Immunotherapy  . Pleural effusion     SURGICAL HISTORY: Past Surgical History:  Procedure Laterality Date  . CATARACT EXTRACTION Left 09/2015  . CHEST TUBE INSERTION Left 10/01/2018   Procedure: INSERTION PLEURAL DRAINAGE CATHETER;  Surgeon: [name], MD;  Location: [facility] ORS;  Service: Thoracic;  Laterality: Left;  . CORONARY ARTERY BYPASS GRAFT  2004   (CABG with LIMA to the  LAD, SVG to OM2/OM3, SVG  to diag  . DIALYSIS/PERMA CATHETER INSERTION N/A 03/14/2019   Procedure: DIALYSIS/PERMA CATHETER INSERTION;  Surgeon: Algernon Huxley, MD;  Location: [place] [facility];  Service: Cardiovascular;  Laterality: N/A;  . Left ankle surgery     repair of fracture  . Right lower leg surgery     rod  . TEMPORARY DIALYSIS CATHETER N/A 03/11/2019    Procedure: TEMPORARY DIALYSIS CATHETER;  Surgeon: Algernon Huxley, MD;  Location: [place] [facility];  Service: Cardiovascular;  Laterality: N/A;   SOCIAL HISTORY: Social History   Socioeconomic History  . Marital status: Married    Spouse name: Not on file  . Number of children: Not on file  . Years of education: Not on file  . Highest education level: Not on file  Occupational History  . Not on file  Tobacco Use  . Smoking status: Former Smoker    Packs/day: 0.20    Years: 45.00    Pack years: 9.00    Types: Cigarettes    Quit date: 03/05/2019    Years since quitting: 0.1  . Smokeless tobacco: Former Systems developer    Types: Snuff  Substance and Sexual Activity  . Alcohol use: Yes    Alcohol/week: 6.0 standard drinks    Types: 6 Cans of beer per week    Comment: occ, average 6 pack in a week  . Drug use: No  . Sexual activity: Not Currently  Other Topics Concern  . Not on file  Social History Narrative   On disability 2009 after prev injuries and CAD.     Married 1976   2 kids, 4 grandkids.    Social Determinants of Health   Financial Resource Strain:   . Difficulty of Paying Living Expenses:   Food Insecurity:   . Worried About Charity fundraiser in the Last Year:   . Arboriculturist in the Last Year:   Transportation Needs:   . Film/video editor (Medical):   [name]  Lack of Transportation (Non-Medical):   Physical Activity:   . Days of Exercise per Week:   . Minutes of Exercise per Session:   Stress:   . Feeling of Stress :   Social Connections:   . Frequency of Communication with Friends and Family:   . Frequency of Social Gatherings with Friends and Family:   . Attends Religious Services:   . Active Member of Clubs or Organizations:   . Attends Archivist Meetings:   [name] Marital Status:   Intimate Partner Violence:   . Fear of Current or Ex-Partner:   . Emotionally Abused:   [name] Physically Abused:   . Sexually Abused:      FAMILY HISTORY: Family  History  Problem Relation Age of Onset  . Dementia Mother   . Heart disease Father   . Colon cancer Neg Hx   . Prostate cancer Neg Hx   . Diabetes Neg Hx    ALLERGIES:  Allergies  Allergen Reactions  . Lipitor [Atorvastatin Calcium] Other (See Comments)    Aches.  Tolerated crestor.      MEDICATIONS: PHYSICAL EXAMINATION: Current Outpatient Medications on File Prior to Visit  Medication Sig Dispense Refill  . acetaminophen (TYLENOL) 325 MG tablet Take 2 tablets (650 mg total) by mouth every 6 (six) hours as needed for mild pain or fever.    [name] albuterol (VENTOLIN HFA) 108 (90 Base) MCG/ACT inhaler Inhale 2 puffs into the lungs every 6 (six) hours as needed for wheezing or shortness of breath. 18 g 0  . amiodarone (PACERONE) 200 MG tablet Take 1 tablet (200 mg total) by mouth daily. 90 tablet 0  . clopidogrel (PLAVIX) 75 MG tablet Take 1 tablet (75 mg total) by mouth daily. 90 tablet 0  . dextromethorphan-guaiFENesin (MUCINEX DM) 30-600 MG 12hr tablet Take 1 tablet by mouth 2 (two) times daily.    [name] dronabinol (MARINOL) 5 MG capsule Take 1 capsule (5 mg total) by mouth 2 (two) times daily before a meal. 60 capsule 0  . DULoxetine (CYMBALTA) 30 MG capsule Take 1 capsule (30 mg total) by mouth daily. 30 capsule 0  . Ensure Max Protein (ENSURE MAX PROTEIN) LIQD Take 330 mLs (11 oz total) by mouth 2 (two) times daily between meals.    . ferrous sulfate 325 (65 FE) MG tablet Take 1 tablet (325 mg total) by mouth 2 (two) times daily with a meal. 709 tablet 0  . folic acid (FOLVITE) 1 MG tablet Take 1 tablet (1 mg total) by mouth daily.    . insulin aspart (NOVOLOG) 100 UNIT/ML injection Inject 0-8 Units into the skin as directed. Take 0 units if CBG 70-150 Take 1 units if CBG 151-200 Take 2 units if CBG 201-250 Take 3 units if CBG 251-300 Take 4 units if CBG 301-350 Take 6 units if CBG 351-400 If CBG > 400, give 8 units and call MD 10 mL   . insulin detemir (LEVEMIR) 100 UNIT/ML injection  Inject 0.1-0.15 mLs (10-15 Units total) into the skin at bedtime.    . metoprolol succinate (TOPROL-XL) 25 MG 24 hr tablet Take 0.5 tablets (12.5 mg total) by mouth daily. 45 tablet 1  . multivitamin (RENA-VIT) TABS tablet Take 1 tablet by mouth at bedtime.  0  . polyethylene glycol (MIRALAX / GLYCOLAX) 17 g packet Take 17 g by mouth daily. 14 each 0  . promethazine (PHENERGAN) 25 MG tablet Take 1 tablet (25 mg total) by mouth every 6 (six) hours  as needed for nausea or vomiting. 60 tablet 0  . rosuvastatin (CRESTOR) 10 MG tablet TAKE 1 TABLET (10 MG TOTAL) BY MOUTH DAILY. 90 tablet 3  . simethicone (MYLICON) 80 MG chewable tablet Chew 1 tablet (80 mg total) by mouth 4 (four) times daily as needed for flatulence. 30 tablet 0  . thiamine 100 MG tablet Take 1 tablet (100 mg total) by mouth daily.     No current facility-administered medications on file prior to visit.    ECOG PERFORMANCE STATUS:2  Today's Vitals   05/01/19 0933 05/01/19 0934  BP: 103/63   Pulse: 84   Resp: 18   Temp: (!) 95.5 F (35.3 C)   SpO2: 98%   Weight: 171 lb (77.6 kg)   PainSc:  0-No pain   Body mass index is 23.19 kg/m.  Physical Exam Constitutional:      General: He is not in acute distress.    Comments: Patient sits in the wheelchair.  HENT:     Head: Normocephalic and atraumatic.  Eyes:     General: No scleral icterus.    Pupils: Pupils are equal, round, and reactive to light.  Cardiovascular:     Rate and Rhythm: Normal rate and regular rhythm.     Heart sounds: Normal heart sounds.  Pulmonary:     Effort: Pulmonary effort is normal. No respiratory distress.     Breath sounds: No wheezing.     Comments: Good air entry the right lower lung base. Decreased breath sound left lower lung. Abdominal:     General: Bowel sounds are normal. There is no distension.     Palpations: Abdomen is soft. There is no mass.     Tenderness: There is no abdominal tenderness.  Musculoskeletal:        General: No  deformity. Normal range of motion.     Cervical back: Normal range of motion and neck supple.  Skin:    General: Skin is warm and dry.     Findings: No erythema or rash.  Neurological:     Mental Status: He is alert and oriented to person, place, and time. Mental status is at baseline.     Cranial Nerves: No cranial nerve deficit.     Coordination: Coordination normal.  Psychiatric:        Mood and Affect: Mood normal.     LABORATORY DATA:  I have reviewed the data as listed Lab Results  Component Value Date   WBC 18.9 (H) 11/09/2018   HGB 11.0 (L) 11/09/2018   HCT 33.5 (L) 11/09/2018   MCV 89.8 11/09/2018   PLT 581 (H) 11/09/2018   Recent Labs    10/19/18 0841 11/02/18 1238 11/09/18 0833  NA 137 132* 131*  K 4.0 4.0 3.5  CL 104 95* 94*  CO2 '24 22 26  ' GLUCOSE 252* 425* 361*  BUN '14 15 12  ' CREATININE 0.61 0.84 0.77  CALCIUM 8.5* 8.5* 8.0*  GFRNONAA >60 >60 >60  GFRAA >60 >60 >60  PROT 6.0* 7.0 6.5  ALBUMIN 3.0* 2.6* 2.3*  AST 14* 11* 12*  ALT '10 10 9  ' ALKPHOS 69 75 63  BILITOT 0.5 0.9 0.8   Iron/TIBC/Ferritin/ %Sat No results found for: IRON, TIBC, FERRITIN, IRONPCTSAT    RADIOGRAPHIC STUDIES: I have personally reviewed the radiological images as listed and agreed with the findings in the report.   DG Chest 1 View  Result Date: 03/14/2019 CLINICAL DATA:  Wheezing EXAM: CHEST  1 VIEW COMPARISON:  03/03/2019 FINDINGS: Borderline heart size.  CABG. Diffuse interstitial opacity with Kerley lines and left more than right pleural effusion. No pneumothorax. IMPRESSION: CHF. Electronically Signed   By: Monte Fantasia M.D.   On: 03/14/2019 05:47   DG Chest 2 View  Result Date: 03/28/2019 CLINICAL DATA:  End-stage renal disease EXAM: CHEST - 2 VIEW COMPARISON:  03/26/2019 FINDINGS: Cardiac shadow is mildly enlarged but stable. Postsurgical changes and dialysis catheter are again seen. Bilateral pleural effusions are seen stable in appearance from the prior exam. Mild  vascular congestion is again noted but slightly improved. No focal confluent infiltrate is seen. IMPRESSION: Slight improvement in the degree of vascular congestion. Bilateral pleural effusions stable from the prior exam. Electronically Signed   By: Inez Catalina M.D.   On: 03/28/2019 14:22   DG Chest 2 View  Result Date: 03/26/2019 CLINICAL DATA:  Shortness of breath EXAM: CHEST - 2 VIEW COMPARISON:  March 15, 2019 FINDINGS: Heart is mildly enlarged with pulmonary venous hypertension. There is a pleural effusion on the left. There is consolidation in the medial left base. There is mild interstitial edema present with atelectatic change in the left mid and lower lung zones. Patient is status post coronary artery bypass grafting. Central catheter tip is in the superior vena cava. No pneumothorax. No bone lesions. IMPRESSION: Cardiomegaly with pulmonary vascular congestion. Left pleural effusion. There is a degree of interstitial edema. Suspect a degree of congestive heart failure. Consolidation in the medial left base may represent alveolar edema or pneumonia. Both entities may be present concurrently. Central catheter tip is in the superior vena cava. Patient is status post coronary artery bypass grafting. Electronically Signed   By: Lowella Grip III M.D.   On: 03/26/2019 16:56   CT HEAD WO CONTRAST  Result Date: 03/05/2019 CLINICAL DATA:  Metastatic disease evaluation.  Lung cancer. EXAM: CT HEAD WITHOUT CONTRAST TECHNIQUE: Contiguous axial images were obtained from the base of the skull through the vertex without intravenous contrast. COMPARISON:  MRI head 09/20/2018 FINDINGS: Brain: Mild atrophy. Mild hypodensity in the white matter unchanged from the prior MRI. No acute infarct, hemorrhage, mass. No edema or midline shift. Vascular: Negative for hyperdense vessel Skull: Negative skull. Sinuses/Orbits: Mucosal edema and bony thickening right maxillary sinus. Chronic nasal bone fracture. Left cataract  extraction. Other: None IMPRESSION: No acute abnormality and negative for metastatic disease on unenhanced CT. Atrophy and mild chronic microvascular ischemia. Electronically Signed   By: Franchot Gallo M.D.   On: 03/05/2019 15:28   CT CHEST WO CONTRAST  Result Date: 03/14/2019 CLINICAL DATA:  Respiratory failure. EXAM: CT CHEST WITHOUT CONTRAST TECHNIQUE: Multidetector CT imaging of the chest was performed following the standard protocol without IV contrast. COMPARISON:  Chest CT dated 01/22/2019. FINDINGS: Cardiovascular: Heart size appears stable. No pericardial effusion. Diffuse coronary artery calcifications. Surgical changes of median sternotomy for presumed CABG. Mediastinum/Nodes: No mass or enlarged lymph nodes seen within the mediastinum. Esophagus appears normal. Trachea is unremarkable. Lungs/Pleura: New RIGHT pleural effusion, moderate to large in size. Associated compressive atelectasis within the RIGHT perihilar and lower lobe. New ill-defined consolidation within the LEFT lower lobe, suspected pneumonia. Probable additional rounded atelectasis within the lingula. Diffuse interstitial thickening indicating some degree of volume overload versus atypical infection. Upper Abdomen: Limited images of the upper abdomen are unremarkable. Musculoskeletal: No acute or suspicious osseous finding. IMPRESSION: 1. New ill-defined consolidation within the LEFT lower lobe, suspected pneumonia. 2. New moderate to large-sized RIGHT pleural effusion, with associated compressive  atelectasis. 3. Diffuse interstitial thickening, most likely edema/fluid overload, less likely additional atypical infection. 4. Probable additional rounded atelectasis within the lingula. Aortic Atherosclerosis (ICD10-I70.0). Electronically Signed   By: Franki Cabot M.D.   On: 03/14/2019 19:47   US RENAL  Result Date: 03/10/2019 CLINICAL DATA:  64 year old presenting with acute renal failure. Evaluate for obstruction as a possible  cause. EXAM: RENAL / URINARY TRACT ULTRASOUND COMPLETE COMPARISON:  Urinary tract ultrasound 03/04/2019. CT abdomen and pelvis 01/22/2019. FINDINGS: Right Kidney: Renal measurements: Approximately 12.6 x 6.4 x 6.2 cm = volume: 264 mL. Mildly echogenic parenchyma. No hydronephrosis. Well-preserved cortex. No shadowing calculi. No focal parenchymal abnormality. Left Kidney: Renal measurements: Approximately 12.5 x 6.3 x 6.9 cm = volume: 283 mL. Mildly echogenic parenchyma. No hydronephrosis. Well-preserved cortex. No shadowing calculi. No focal parenchymal abnormality. Bladder: Normal for degree of bladder distention. Other: None. IMPRESSION: 1. Mildly echogenic renal parenchyma indicative of medical renal disease. 2. Otherwise normal examination. Specifically, no evidence of urinary tract obstruction to account for the acute renal insufficiency. Electronically Signed   By: Evangeline Dakin M.D.   On: 03/10/2019 15:02   US RENAL  Result Date: 03/04/2019 CLINICAL DATA:  Acute kidney injury EXAM: RENAL / URINARY TRACT ULTRASOUND COMPLETE COMPARISON:  CT 01/22/2019 FINDINGS: Right Kidney: Renal measurements: 11.4 x 6 x 6 cm = volume: 215 mL . Echogenicity within normal limits. No mass or hydronephrosis visualized. Left Kidney: Renal measurements: 11.8 x 7.3 x 6.5 cm = volume: 291.8 mL. Echogenicity within normal limits. No mass or hydronephrosis visualized. Bladder: Appears normal for degree of bladder distention. Other: Incidental note made of gallstones and small effusions. Heterogenous prostate. IMPRESSION: Normal ultrasound appearance of the kidneys Electronically Signed   By: Donavan Foil M.D.   On: 03/04/2019 15:37   PERIPHERAL VASCULAR CATHETERIZATION  Result Date: 03/14/2019 See op note  PERIPHERAL VASCULAR CATHETERIZATION  Result Date: 03/11/2019 See op note  DG Chest Port 1 View  Result Date: 03/29/2019 CLINICAL DATA:  Post thoracentesis. EXAM: PORTABLE CHEST 1 VIEW COMPARISON:  03/28/2019.  FINDINGS: Post thoracentesis chest x-ray reveals no evidence of pneumothorax. Significant reduction in right-sided pleural effusion. Small left pleural effusion again noted. Bibasilar atelectasis/infiltrates again noted. Prior CABG. Right IJ line stable position. IMPRESSION: Post thoracentesis chest x-ray reveals no evidence of pneumothorax. Significant reduction in right-sided pleural effusion. Electronically Signed   By: Koch Moores  Register   On: 03/29/2019 15:01   DG Chest Port 1 View  Result Date: 03/15/2019 CLINICAL DATA:  Status post thoracentesis. EXAM: PORTABLE CHEST 1 VIEW COMPARISON:  03/14/2019 FINDINGS: Interval decrease right pleural effusion. No evidence for pneumothorax. Bibasilar atelectasis/infiltrate again noted with persistent small left pleural effusion. The cardiopericardial silhouette is within normal limits for size. Pulmonary vascular congestion has decreased in the interval. Right IJ central line tip overlies the mid to distal SVC. The visualized bony structures of the thorax are intact. Telemetry leads overlie the chest. IMPRESSION: Interval decrease in right pleural effusion without evidence of pneumothorax. Decrease in pulmonary vascular congestion with persistent bibasilar atelectasis/infiltrate and small left pleural effusion. Electronically Signed   By: Misty Stanley M.D.   On: 03/15/2019 11:36   DG Chest Portable 1 View  Result Date: 03/03/2019 CLINICAL DATA:  Shortness of breath, since Monday worsening this morning around 1 a.m. EXAM: PORTABLE CHEST 1 VIEW COMPARISON:  12/19/2018 FINDINGS: Cardiomediastinal contours are stable following median sternotomy with persistent left-sided pleural effusion and pleural-parenchymal scarring. No new areas of consolidation or evidence of right-sided pleural  effusion. No signs of acute bone process. IMPRESSION: Persistent left-sided pleural effusion and pleural-parenchymal scarring. Electronically Signed   By: Zetta Bills M.D.   On:  03/03/2019 13:59   ECHOCARDIOGRAM COMPLETE  Result Date: 03/15/2019    ECHOCARDIOGRAM REPORT   Patient Name:   SERAJ DUNNAM Date of Exam: 03/15/2019 Medical Rec #:  [id number]       Height:       72.0 in Accession #:    [id number]      Weight:       201.4 lb Date of Birth:  [birth year]       BSA:          2.137 m Patient Age:    19 years        BP:           125/69 mmHg Patient Gender: M               HR:           83 bpm. Exam Location:  [facility] Procedure: 2D Echo, Cardiac Doppler and Color Doppler Indications:     Respiratory distress  History:         Patient has prior history of Echocardiogram examinations, most                  recent 03/04/2019. Prior CABG; Risk Factors:Hypertension and                  Diabetes.  Sonographer:     Sherrie Sport RDCS (AE) Referring Phys:  [id number] Claiborne Billings A GRIFFITH Diagnosing Phys: Kathlyn Sacramento MD IMPRESSIONS  1. Left ventricular ejection fraction, by estimation, is 35 to 40%. The left ventricle has moderately decreased function. The left ventricle demonstrates regional wall motion abnormalities . The left ventricular internal cavity size was mildly dilated. There is mild left ventricular hypertrophy. Left ventricular diastolic parameters are consistent with Grade II diastolic dysfunction (pseudonormalization). There is moderate hypokinesis of the left ventricular, entire inferior wall.  2. Right ventricular systolic function is normal. The right ventricular size is normal. There is moderately elevated pulmonary artery systolic pressure. The estimated right ventricular systolic pressure is 30.8 mmHg.  3. Left atrial size was mildly dilated.  4. The mitral valve is normal in structure. Moderate mitral valve regurgitation. No evidence of mitral stenosis.  5. The aortic valve is abnormal. Aortic valve regurgitation is mild. Mild to moderate aortic valve sclerosis/calcification is present, without any evidence of aortic stenosis.  6. The inferior vena cava is normal in size with  greater than 50% respiratory variability, suggesting right atrial pressure of 3 mmHg. FINDINGS  Left Ventricle: Left ventricular ejection fraction, by estimation, is 35 to 40%. The left ventricle has moderately decreased function. The left ventricle demonstrates regional wall motion abnormalities. Moderate hypokinesis of the left ventricular, entire inferior wall. The left ventricular internal cavity size was mildly dilated. There is mild left ventricular hypertrophy. Left ventricular diastolic parameters are consistent with Grade II diastolic dysfunction (pseudonormalization). Right Ventricle: The right ventricular size is normal. No increase in right ventricular wall thickness. Right ventricular systolic function is normal. There is moderately elevated pulmonary artery systolic pressure. The tricuspid regurgitant velocity is 3.37 m/s, and with an assumed right atrial pressure of 3 mmHg, the estimated right ventricular systolic pressure is 65.7 mmHg. Left Atrium: Left atrial size was mildly dilated. Right Atrium: Right atrial size was normal in size. Pericardium: There is no evidence of pericardial effusion. Mitral Valve: The mitral  valve is normal in structure. Normal mobility of the mitral valve leaflets. Moderate mitral valve regurgitation. No evidence of mitral valve stenosis. Tricuspid Valve: The tricuspid valve is normal in structure. Tricuspid valve regurgitation is mild . No evidence of tricuspid stenosis. Aortic Valve: The aortic valve is abnormal. Aortic valve regurgitation is mild. Mild to moderate aortic valve sclerosis/calcification is present, without any evidence of aortic stenosis. Aortic valve mean gradient measures 4.5 mmHg. Aortic valve peak gradient measures 8.4 mmHg. Aortic valve area, by VTI measures 2.54 cm. Pulmonic Valve: The pulmonic valve was normal in structure. Pulmonic valve regurgitation is not visualized. No evidence of pulmonic stenosis. Aorta: The aortic root is normal in size and  structure. Venous: The inferior vena cava is normal in size with greater than 50% respiratory variability, suggesting right atrial pressure of 3 mmHg. IAS/Shunts: No atrial level shunt detected by color flow Doppler.  LEFT VENTRICLE PLAX 2D LVIDd:         5.34 cm      Diastology LVIDs:         4.38 cm      LV e' lateral:   5.77 cm/s LV PW:         1.00 cm      LV E/e' lateral: 22.4 LV IVS:        1.20 cm      LV e' medial:    5.00 cm/s LVOT diam:     2.00 cm      LV E/e' medial:  25.8 LV SV:         63 LV SV Index:   30 LVOT Area:     3.14 cm  LV Volumes (MOD) LV vol d, MOD A2C: 227.0 ml LV vol d, MOD A4C: 148.0 ml LV vol s, MOD A2C: 142.0 ml LV vol s, MOD A4C: 110.0 ml LV SV MOD A2C:     85.0 ml LV SV MOD A4C:     148.0 ml LV SV MOD BP:      59.1 ml RIGHT VENTRICLE RV Basal diam:  4.14 cm RV S prime:     10.30 cm/s TAPSE (M-mode): 3.2 cm LEFT ATRIUM             Index       RIGHT ATRIUM           Index LA diam:        4.80 cm 2.25 cm/m  RA Area:     15.70 cm LA Vol (A2C):   68.8 ml 32.20 ml/m RA Volume:   40.00 ml  18.72 ml/m LA Vol (A4C):   77.9 ml 36.45 ml/m LA Biplane Vol: 75.1 ml 35.14 ml/m  AORTIC VALVE                   PULMONIC VALVE AV Area (Vmax):    2.41 cm    PV Vmax:        0.99 m/s AV Area (Vmean):   2.15 cm    PV Peak grad:   3.9 mmHg AV Area (VTI):     2.54 cm    RVOT Peak grad: 4 mmHg AV Vmax:           144.50 cm/s AV Vmean:          94.950 cm/s AV VTI:            0.249 m AV Peak Grad:      8.4 mmHg AV Mean Grad:      4.5  mmHg LVOT Vmax:         111.00 cm/s LVOT Vmean:        65.100 cm/s LVOT VTI:          0.201 m LVOT/AV VTI ratio: 0.81  AORTA Ao Root diam: 2.90 cm MITRAL VALVE                TRICUSPID VALVE MV Area (PHT): 5.13 cm     TR Peak grad:   45.4 mmHg MV Decel Time: 148 msec     TR Vmax:        337.00 cm/s MV E velocity: 129.00 cm/s MV A velocity: 68.40 cm/s   SHUNTS MV E/A ratio:  1.89         Systemic VTI:  0.20 m                             Systemic Diam: 2.00 cm Kathlyn Sacramento MD Electronically signed by Kathlyn Sacramento MD Signature Date/Time: 03/15/2019/11:27:06 AM    Final    ECHOCARDIOGRAM COMPLETE  Result Date: 03/04/2019    ECHOCARDIOGRAM REPORT   Patient Name:   BRIDGER PIZZI Date of Exam: 03/04/2019 Medical Rec #:  [id number]       Height:       72.0 in Accession #:    [id number]      Weight:       185.0 lb Date of Birth:  10-20-1955       BSA:          2.061 m Patient Age:    81 years        BP:           125/72 mmHg Patient Gender: M               HR:           119 bpm. Exam Location:  [facility] Procedure: 2D Echo, Color Doppler and Cardiac Doppler Indications:     I21.4 NSTEMI  History:         Patient has prior history of Echocardiogram examinations, most                  recent 08/24/2018. CAD, Prior CABG; Risk Factors:Hypertension,                  Dyslipidemia and Diabetes. Lung Cancer.  Sonographer:     Charmayne Sheer RDCS (AE) Referring Phys:  [place] Phys: Kathlyn Sacramento MD IMPRESSIONS  1. Left ventricular ejection fraction, by estimation, is 30 to 35%. The left ventricle has moderately decreased function. The left ventricle demonstrates global hypokinesis. Left ventricular diastolic parameters are indeterminate.  2. Right ventricular systolic function is normal. The right ventricular size is normal. Tricuspid regurgitation signal is inadequate for assessing PA pressure.  3. Left atrial size was mild to moderately dilated.  4. The mitral valve is normal in structure and function. Moderate mitral valve regurgitation. No evidence of mitral stenosis.  5. The aortic valve is normal in structure and function. Aortic valve regurgitation is trivial. Mild to moderate aortic valve sclerosis/calcification is present, without any evidence of aortic stenosis.  6. The inferior vena cava is normal in size with greater than 50% respiratory variability, suggesting right atrial pressure of 3 mmHg. FINDINGS  Left Ventricle: Left ventricular ejection fraction, by  estimation, is 30 to 35%. The left ventricle has moderately decreased function. The left ventricle demonstrates  global hypokinesis. The left ventricular internal cavity size was normal in size. There is no left ventricular hypertrophy. Left ventricular diastolic parameters are indeterminate. Right Ventricle: The right ventricular size is normal. No increase in right ventricular wall thickness. Right ventricular systolic function is normal. Tricuspid regurgitation signal is inadequate for assessing PA pressure. Left Atrium: Left atrial size was mild to moderately dilated. Right Atrium: Right atrial size was normal in size. Pericardium: There is no evidence of pericardial effusion. Mitral Valve: The mitral valve is normal in structure and function. Normal mobility of the mitral valve leaflets. Moderate mitral valve regurgitation. No evidence of mitral valve stenosis. MV peak gradient, 7.5 mmHg. The mean mitral valve gradient is 4.0  mmHg. Tricuspid Valve: The tricuspid valve is normal in structure. Tricuspid valve regurgitation is not demonstrated. No evidence of tricuspid stenosis. Aortic Valve: The aortic valve is normal in structure and function. Aortic valve regurgitation is trivial. Aortic regurgitation PHT measures 281 msec. Mild to moderate aortic valve sclerosis/calcification is present, without any evidence of aortic stenosis. Aortic valve mean gradient measures 5.0 mmHg. Aortic valve peak gradient measures 8.5 mmHg. Aortic valve area, by VTI measures 2.53 cm. Pulmonic Valve: The pulmonic valve was normal in structure. Pulmonic valve regurgitation is not visualized. No evidence of pulmonic stenosis. Aorta: The aortic root is normal in size and structure. Venous: The inferior vena cava is normal in size with greater than 50% respiratory variability, suggesting right atrial pressure of 3 mmHg. IAS/Shunts: No atrial level shunt detected by color flow Doppler.  LEFT VENTRICLE PLAX 2D LVIDd:         5.21 cm   Diastology LVIDs:         4.21 cm  LV e' lateral:   7.83 cm/s LV PW:         0.84 cm  LV E/e' lateral: 13.4 LV IVS:        0.89 cm  LV e' medial:    7.94 cm/s LVOT diam:     2.40 cm  LV E/e' medial:  13.2 LV SV:         54 LV SV Index:   26 LVOT Area:     4.52 cm  RIGHT VENTRICLE RV Basal diam:  3.08 cm LEFT ATRIUM             Index       RIGHT ATRIUM           Index LA diam:        4.70 cm 2.28 cm/m  RA Area:     14.10 cm LA Vol (A2C):   62.0 ml 30.08 ml/m RA Volume:   35.20 ml  17.08 ml/m LA Vol (A4C):   68.6 ml 33.29 ml/m LA Biplane Vol: 65.3 ml 31.68 ml/m  AORTIC VALVE                    PULMONIC VALVE AV Area (Vmax):    2.95 cm     PV Vmax:       1.10 m/s AV Area (Vmean):   2.86 cm     PV Vmean:      68.400 cm/s AV Area (VTI):     2.53 cm     PV VTI:        0.146 m AV Vmax:           146.00 cm/s  PV Peak grad:  4.8 mmHg AV Vmean:          103.000 cm/s  PV Mean grad:  2.0 mmHg AV VTI:            0.213 m AV Peak Grad:      8.5 mmHg AV Mean Grad:      5.0 mmHg LVOT Vmax:         95.30 cm/s LVOT Vmean:        65.200 cm/s LVOT VTI:          0.119 m LVOT/AV VTI ratio: 0.56 AI PHT:            281 msec  AORTA Ao Root diam: 3.00 cm MITRAL VALVE MV Area (PHT): 3.92 cm     SHUNTS MV Peak grad:  7.5 mmHg     Systemic VTI:  0.12 m MV Mean grad:  4.0 mmHg     Systemic Diam: 2.40 cm MV Vmax:       1.37 m/s MV Vmean:      92.7 cm/s MV Decel Time: 194 msec MV E velocity: 104.80 cm/s Kathlyn Sacramento MD Electronically signed by Kathlyn Sacramento MD Signature Date/Time: 03/04/2019/2:46:43 PM    Final    [place] RT UPPER EXTREM LTD SOFT TISSUE NON VASCULAR  Result Date: 03/08/2019 CLINICAL DATA:  Swelling and bruising of the right upper extremity. Possible hematoma. EXAM: ULTRASOUND right UPPER EXTREMITY LIMITED TECHNIQUE: Ultrasound examination of the upper extremity soft tissues was performed in the area of clinical concern. COMPARISON:  None. FINDINGS: Diffuse subcutaneous soft tissue swelling/edema and streaky fluid. No  discrete fluid collection or hematoma. The underlying musculature is grossly normal. IMPRESSION: Diffuse subcutaneous soft tissue swelling/edema/streaky fluid but no discrete abscess or hematoma. Electronically Signed   By: Marijo Sanes M.D.   On: 03/08/2019 19:13   US THORACENTESIS ASP PLEURAL SPACE W/IMG GUIDE  Result Date: 03/29/2019 INDICATION: Pleural effusion. EXAM: ULTRASOUND GUIDED RIGHT THORACENTESIS MEDICATIONS: None. COMPLICATIONS: None immediate. PROCEDURE: An ultrasound guided thoracentesis was thoroughly discussed with the patient and questions answered. The benefits, risks, alternatives and complications were also discussed. The patient understands and wishes to proceed with the procedure. Written consent was obtained. Ultrasound was performed to localize and mark an adequate pocket of fluid in the right chest. The area was then prepped and draped in the normal sterile fashion. 1% Lidocaine was used for local anesthesia. Under ultrasound guidance a 6 French catheter was introduced. Thoracentesis was performed. The catheter was removed and a dressing applied. FINDINGS: A total of approximately 1 L of yellow fluid was removed. Samples were sent to the laboratory as requested by the clinical team. IMPRESSION: Successful ultrasound guided right thoracentesis yielding 1 L of pleural fluid. Electronically Signed   By: Koch Moores  Register   On: 03/29/2019 15:04   US THORACENTESIS ASP PLEURAL SPACE W/IMG GUIDE  Result Date: 03/15/2019 INDICATION: History of lung cancer with recurrent right pleural effusion. Request for diagnostic and therapeutic thoracentesis. EXAM: ULTRASOUND GUIDED RIGHT THORACENTESIS MEDICATIONS: 1% lidocaine 10 mL COMPLICATIONS: None immediate. PROCEDURE: An ultrasound guided thoracentesis was thoroughly discussed with the patient and questions answered. The benefits, risks, alternatives and complications were also discussed. The patient understands and wishes to proceed with the  procedure. Written consent was obtained. Ultrasound was performed to localize and mark an adequate pocket of fluid in the right chest. The area was then prepped and draped in the normal sterile fashion. 1% Lidocaine was used for local anesthesia. Under ultrasound guidance a 6 Fr Safe-T-Centesis catheter was introduced. Thoracentesis was performed. The catheter was removed and a dressing applied. FINDINGS: A total  of approximately 2.7 L of clear yellow fluid was removed. Samples were sent to the laboratory as requested by the clinical team. IMPRESSION: Successful ultrasound guided right thoracentesis yielding 2.7 L of pleural fluid. No pneumothorax on post-procedure chest x-ray. Read by: Gareth Eagle, PA-C Electronically Signed   By: Sandi Mariscal M.D.   On: 03/15/2019 12:44     ASSESSMENT & PLAN:  1. Malignant neoplasm of unspecified part of unspecified bronchus or lung ([place])   2. Goals of care, counseling/discussion   3. Chronic obstructive pulmonary disease, unspecified COPD type ([place])    #Stage IV metastatic lung adenocarcinoma TxNx M1 Omniseq showed [id number] mutation, results were discussed with patient and wife. BRAF targeted therapy was discontinued due to patient's recent acute illness including acute CHF, DKA, renal failure on hemodialysis.   Discussed with patient about his options.  BRAF targeted therapies are currently contraindicated and to his CHF because to near baseline He is not interested in chemotherapy and I think his only other option is single agent immunotherapy.  PD-L1 is 8% and response rate is about 10 to 18%.  Patient has previously received Keytruda and tolerates well. Patient agrees with the plan and is willing to proceed with Keytruda. Patient understands that intent of treatment is palliative. I will see him in 1 week prior to start immunotherapy and hopefully by then we will know if he needs to continue hemodialysis or not I discussed with palliative care [facility].  I agree with proceeding with a chest x-ray for further evaluation of his pleural effusion and the need of thoracentesis. Agree with starting long-acting bronchodilators for COPD management.   Return visit, 1 week for [facility] treatments. Earlie Server, MD, PhD Hematology Oncology [place] at [facility] 05/01/2019

## 2019-05-02 ENCOUNTER — Other Ambulatory Visit: Payer: Self-pay | Admitting: Hospice and Palliative Medicine

## 2019-05-02 DIAGNOSIS — N184 Chronic kidney disease, stage 4 (severe): Secondary | ICD-10-CM | POA: Diagnosis not present

## 2019-05-02 DIAGNOSIS — N179 Acute kidney failure, unspecified: Secondary | ICD-10-CM | POA: Diagnosis not present

## 2019-05-02 DIAGNOSIS — N189 Chronic kidney disease, unspecified: Secondary | ICD-10-CM | POA: Diagnosis not present

## 2019-05-02 MED ORDER — LEVOFLOXACIN 250 MG PO TABS: ORAL_TABLET | ORAL | 0 refills | Status: DC

## 2019-05-02 NOTE — Progress Notes (Signed)
I spoke with patient by phone.  We reviewed the results of his chest x-ray.  We will start him on oral Levaquin to cover for community-acquired pneumonia at renal dosing.  Discussed with nephrology.  CrCl is 25 on 24-hour urine this week.  Hemodialysis will be discontinued.  Case and plan discussed with Dr. Tasia Catchings.  Plan: -Levaquin 500 mg PO x 1 day and then 250 mg PO daily x6 days

## 2019-05-07 DIAGNOSIS — N179 Acute kidney failure, unspecified: Secondary | ICD-10-CM | POA: Diagnosis not present

## 2019-05-08 ENCOUNTER — Other Ambulatory Visit: Payer: Self-pay

## 2019-05-08 ENCOUNTER — Encounter: Payer: Self-pay | Admitting: Oncology

## 2019-05-08 ENCOUNTER — Inpatient Hospital Stay (HOSPITAL_BASED_OUTPATIENT_CLINIC_OR_DEPARTMENT_OTHER): Payer: Medicare Other | Admitting: Oncology

## 2019-05-08 ENCOUNTER — Inpatient Hospital Stay: Payer: Medicare Other | Attending: Oncology

## 2019-05-08 ENCOUNTER — Inpatient Hospital Stay: Payer: Medicare Other

## 2019-05-08 VITALS — BP 97/57 | HR 77 | Temp 95.8°F | Resp 16 | Wt 170.6 lb

## 2019-05-08 DIAGNOSIS — Z5112 Encounter for antineoplastic immunotherapy: Secondary | ICD-10-CM | POA: Diagnosis not present

## 2019-05-08 DIAGNOSIS — E1136 Type 2 diabetes mellitus with diabetic cataract: Secondary | ICD-10-CM | POA: Diagnosis not present

## 2019-05-08 DIAGNOSIS — C3490 Malignant neoplasm of unspecified part of unspecified bronchus or lung: Secondary | ICD-10-CM

## 2019-05-08 DIAGNOSIS — E1122 Type 2 diabetes mellitus with diabetic chronic kidney disease: Secondary | ICD-10-CM | POA: Diagnosis not present

## 2019-05-08 DIAGNOSIS — R634 Abnormal weight loss: Secondary | ICD-10-CM

## 2019-05-08 DIAGNOSIS — N186 End stage renal disease: Secondary | ICD-10-CM | POA: Insufficient documentation

## 2019-05-08 DIAGNOSIS — N184 Chronic kidney disease, stage 4 (severe): Secondary | ICD-10-CM

## 2019-05-08 DIAGNOSIS — J910 Malignant pleural effusion: Secondary | ICD-10-CM | POA: Insufficient documentation

## 2019-05-08 DIAGNOSIS — J449 Chronic obstructive pulmonary disease, unspecified: Secondary | ICD-10-CM | POA: Diagnosis not present

## 2019-05-08 DIAGNOSIS — J189 Pneumonia, unspecified organism: Secondary | ICD-10-CM | POA: Insufficient documentation

## 2019-05-08 DIAGNOSIS — Z87891 Personal history of nicotine dependence: Secondary | ICD-10-CM | POA: Diagnosis not present

## 2019-05-08 DIAGNOSIS — J440 Chronic obstructive pulmonary disease with acute lower respiratory infection: Secondary | ICD-10-CM | POA: Diagnosis not present

## 2019-05-08 LAB — COMPREHENSIVE METABOLIC PANEL
ALT: 14 U/L (ref 0–44)
AST: 16 U/L (ref 15–41)
Albumin: 2.6 g/dL — ABNORMAL LOW (ref 3.5–5.0)
Alkaline Phosphatase: 142 U/L — ABNORMAL HIGH (ref 38–126)
Anion gap: 12 (ref 5–15)
BUN: 25 mg/dL — ABNORMAL HIGH (ref 8–23)
CO2: 28 mmol/L (ref 22–32)
Calcium: 8.6 mg/dL — ABNORMAL LOW (ref 8.9–10.3)
Chloride: 97 mmol/L — ABNORMAL LOW (ref 98–111)
Creatinine, Ser: 1.62 mg/dL — ABNORMAL HIGH (ref 0.61–1.24)
GFR calc Af Amer: 51 mL/min — ABNORMAL LOW (ref >60)
GFR calc non Af Amer: 44 mL/min — ABNORMAL LOW (ref >60)
Glucose, Bld: 151 mg/dL — ABNORMAL HIGH (ref 70–99)
Potassium: 3.5 mmol/L (ref 3.5–5.1)
Sodium: 137 mmol/L (ref 135–145)
Total Bilirubin: 0.5 mg/dL (ref 0.3–1.2)
Total Protein: 6.8 g/dL (ref 6.5–8.1)

## 2019-05-08 LAB — CBC WITH DIFFERENTIAL/PLATELET
Abs Immature Granulocytes: 0.03 10*3/uL (ref 0.00–0.07)
Basophils Absolute: 0.1 10*3/uL (ref 0.0–0.1)
Basophils Relative: 2 %
Eosinophils Absolute: 0.4 10*3/uL (ref 0.0–0.5)
Eosinophils Relative: 8 %
HCT: 29.9 % — ABNORMAL LOW (ref 39.0–52.0)
Hemoglobin: 10 g/dL — ABNORMAL LOW (ref 13.0–17.0)
Immature Granulocytes: 1 %
Lymphocytes Relative: 12 %
Lymphs Abs: 0.6 10*3/uL — ABNORMAL LOW (ref 0.7–4.0)
MCH: 30.4 pg (ref 26.0–34.0)
MCHC: 33.4 g/dL (ref 30.0–36.0)
MCV: 90.9 fL (ref 80.0–100.0)
Monocytes Absolute: 0.6 10*3/uL (ref 0.1–1.0)
Monocytes Relative: 12 %
Neutro Abs: 3.4 10*3/uL (ref 1.7–7.7)
Neutrophils Relative %: 65 %
Platelets: 364 10*3/uL (ref 150–400)
RBC: 3.29 MIL/uL — ABNORMAL LOW (ref 4.22–5.81)
RDW: 17.2 % — ABNORMAL HIGH (ref 11.5–15.5)
WBC: 5.1 10*3/uL (ref 4.0–10.5)
nRBC: 0 % (ref 0.0–0.2)

## 2019-05-08 MED ORDER — SODIUM CHLORIDE 0.9 % IV SOLN
Freq: Once | INTRAVENOUS | Status: AC
  Filled 2019-05-08: qty 250

## 2019-05-08 MED ORDER — SODIUM CHLORIDE 0.9 % IV SOLN
200.0000 mg | Freq: Once | INTRAVENOUS | Status: AC
  Administered 2019-05-08: 11:00:00 200 mg via INTRAVENOUS
  Filled 2019-05-08: qty 8

## 2019-05-08 NOTE — Progress Notes (Signed)
Hematology/Oncologyfollow up  note [facility] Telephone:([phone]) [phone] [phone]   Patient Care Team: Tonia Ghent, MD as PCP - General (Family Medicine) Thelma Comp, [place] (Optometry) Telford Nab, RN as Registered Nurse  REFERRING PROVIDER: Tonia Ghent, MD  CHIEF COMPLAINTS/REASON FOR VISIT:  Follow-up for metastatic lung adenocarcinoma  HISTORY OF PRESENTING ILLNESS:   Craig Koch is a  64 y.o.  male with PMH listed below was seen in consultation at the request of  Tonia Ghent, MD  for evaluation of malignant pleural effusion. Patient was recently admitted to [facility] due to large amount of pleural effusion, acute hypoxic respiratory failure. Patient reports feeling shortness of breath and dyspnea on exertion for several weeks and presented to emergency room.  A chest x-ray 08/23/2018 showed a large amount of left pleural effusion 08/24/2018 CT chest abdomen pelvis confirmed large left pleural effusion with near complete collapse of the left lower lobe and extensive volume loss in the lingula and a portion of the left upper lobe. No acute intra-abdominal process.  Cholelithiasis, diverticulitis, coronary atherosclerosis post CABG.  Bilateral renal cortical scarring.  Aortic atherosclerosis.  #08/24/2018 ultrasound guided diagnostic and therapeutic thoracentesis removed 1.8 L of pleural fluid. Patient subsequently felt better and was discharged to follow-up with pulmonology. 09/04/2018 patient followed up with Dr.Aleskerov chest x-ray showed recurrent moderate left pleural effusion.  Patient underwent ultrasound-guided left thoracentesis again and it drained 3 L of fluid. Pleural fluid cytology positive for adenocarcinoma. Patient was referred to me for further evaluation and management.  # PET eft sided pleural hypermetabolic activity  consistent with malignant pleural effusion. Left upper and lower lobe low-level hypermetabolic him  corresponding to areas of airspace and groundglass opacity.  This was felt to be secondary to atelectasis.  Underlying left upper lobe primary bronchogenic carcinoma cannot be excluded. Medial left upper lobe area more focal hypermetabolic area could represent a pleural disease or a site of small left upper lobe primary. No evidence of extra thoracic hypermetabolic metastatic disease.  Left sided hydro-pneumothorax within the pleural air component being new since the prior CT. Cholelithiasis.  #MRI brain showed punctate focus of enhancement within the right parietal lobe cortex suspicious for possible metastasis. Molecular study showed BRAF V600 mutation. 11/16/2018 started on dabrafenib 150 mg twice daily and trametinib 2 mg daily.  He had good response  #Patient was admitted from 03/03/2019-03/19/2019 due to DKA due to uncontrolled diabetes, acute decompensation of CHF left lower lobe pneumonia right pleural effusion status post thoracentesis.  Fluid study was consistent with transudate. AKI secondary to ATN, on hemodialysis Tuesday Thursday and Saturday, atrial fibrillation with RVR, paroxysmal.  Patient was discharged on Eliquis and amiodarone. Patient was readmitted from 03/26/2019-04/01/2019 due to CHF exacerbation, right pleural effusion recurrent.  Status post diagnostic and therapeutic thoracentesis.  Cytology was negative for malignant cells.   INTERVAL HISTORY Craig Koch is a 64 y.o. male who has above history reviewed by me today presents for follow-up of lung cancer Problems and complaints are listed below:  Patient was accompanied by his wife today.  Chest x-ray 05/01/2019 showed possible right lower base pneumonia.  Patient was treated with a course of antibiotics.  Today he feels much better.  Breathing difficulties have improved.  Also started on long-acting bronchodilator inhaler. Also no longer need hemodialysis. Patient has good urine output.    Review of Systems   Constitutional: Positive for appetite change and fatigue. Negative for chills, fever and unexpected weight change.  HENT:   Negative for hearing loss and voice change.   Eyes: Negative for eye problems and icterus.  Respiratory: Positive for shortness of breath. Negative for chest tightness and cough.   Cardiovascular: Negative for chest pain and leg swelling.  Gastrointestinal: Negative for abdominal distention, abdominal pain and nausea.  Endocrine: Negative for hot flashes.  Genitourinary: Negative for difficulty urinating, dysuria and frequency.   Musculoskeletal: Negative for arthralgias.  Skin: Negative for itching and rash.  Neurological: Negative for light-headedness and numbness.  Hematological: Negative for adenopathy. Does not bruise/bleed easily.  Psychiatric/Behavioral: Negative for confusion.    MEDICAL HISTORY:  Past Medical History:  Diagnosis Date  . Anxiety   . Arthritis   . CHF (congestive heart failure) ([place])   . Coronary artery disease   . Diabetes mellitus   . Dyslipidemia   . Hx of CABG   . Hyperlipidemia   . Hypertension   . Malignant neoplasm of unspecified part of unspecified bronchus or lung ([place]) 08/2018   Immunotherapy  . Pleural effusion     SURGICAL HISTORY: Past Surgical History:  Procedure Laterality Date  . CATARACT EXTRACTION Left 09/2015  . CHEST TUBE INSERTION Left 10/01/2018   Procedure: INSERTION PLEURAL DRAINAGE CATHETER;  Surgeon: [name], MD;  Location: [facility] ORS;  Service: Thoracic;  Laterality: Left;  . CORONARY ARTERY BYPASS GRAFT  2004   (CABG with LIMA to the  LAD, SVG to OM2/OM3, SVG  to diag  . DIALYSIS/PERMA CATHETER INSERTION N/A 03/14/2019   Procedure: DIALYSIS/PERMA CATHETER INSERTION;  Surgeon: Algernon Huxley, MD;  Location: [place] [facility];  Service: Cardiovascular;  Laterality: N/A;  . Left ankle surgery     repair of fracture  . Right lower leg surgery     rod  . TEMPORARY DIALYSIS CATHETER N/A 03/11/2019    Procedure: TEMPORARY DIALYSIS CATHETER;  Surgeon: Algernon Huxley, MD;  Location: [place] [facility];  Service: Cardiovascular;  Laterality: N/A;   SOCIAL HISTORY: Social History   Socioeconomic History  . Marital status: Married    Spouse name: Not on file  . Number of children: Not on file  . Years of education: Not on file  . Highest education level: Not on file  Occupational History  . Not on file  Tobacco Use  . Smoking status: Former Smoker    Packs/day: 0.20    Years: 45.00    Pack years: 9.00    Types: Cigarettes    Quit date: 03/05/2019    Years since quitting: 0.1  . Smokeless tobacco: Former Systems developer    Types: Snuff  Substance and Sexual Activity  . Alcohol use: Yes    Alcohol/week: 6.0 standard drinks    Types: 6 Cans of beer per week    Comment: occ, average 6 pack in a week  . Drug use: No  . Sexual activity: Not Currently  Other Topics Concern  . Not on file  Social History Narrative   On disability 2009 after prev injuries and CAD.     Married 1976   2 kids, 4 grandkids.    Social Determinants of Health   Financial Resource Strain:   . Difficulty of Paying Living Expenses:   Food Insecurity:   . Worried About Charity fundraiser in the Last Year:   . Arboriculturist in the Last Year:   Transportation Needs:   . Film/video editor (Medical):   [name] Lack of Transportation (Non-Medical):   Physical Activity:   .  Days of Exercise per Week:   . Minutes of Exercise per Session:   Stress:   . Feeling of Stress :   Social Connections:   . Frequency of Communication with Friends and Family:   . Frequency of Social Gatherings with Friends and Family:   . Attends Religious Services:   . Active Member of Clubs or Organizations:   . Attends Archivist Meetings:   [name] Marital Status:   Intimate Partner Violence:   . Fear of Current or Ex-Partner:   . Emotionally Abused:   [name] Physically Abused:   . Sexually Abused:      FAMILY HISTORY: Family  History  Problem Relation Age of Onset  . Dementia Mother   . Heart disease Father   . Colon cancer Neg Hx   . Prostate cancer Neg Hx   . Diabetes Neg Hx    ALLERGIES:  Allergies  Allergen Reactions  . Lipitor [Atorvastatin Calcium] Other (See Comments)    Aches.  Tolerated crestor.      MEDICATIONS: PHYSICAL EXAMINATION: Current Outpatient Medications on File Prior to Visit  Medication Sig Dispense Refill  . acetaminophen (TYLENOL) 325 MG tablet Take 2 tablets (650 mg total) by mouth every 6 (six) hours as needed for mild pain or fever.    [name] albuterol (VENTOLIN HFA) 108 (90 Base) MCG/ACT inhaler Inhale 2 puffs into the lungs every 6 (six) hours as needed for wheezing or shortness of breath. 18 g 0  . amiodarone (PACERONE) 200 MG tablet Take 1 tablet (200 mg total) by mouth daily. 90 tablet 0  . clopidogrel (PLAVIX) 75 MG tablet Take 1 tablet (75 mg total) by mouth daily. 90 tablet 0  . dextromethorphan-guaiFENesin (MUCINEX DM) 30-600 MG 12hr tablet Take 1 tablet by mouth 2 (two) times daily.    [name] dronabinol (MARINOL) 5 MG capsule Take 1 capsule (5 mg total) by mouth 2 (two) times daily before a meal. 60 capsule 0  . DULoxetine (CYMBALTA) 30 MG capsule Take 1 capsule (30 mg total) by mouth daily. 30 capsule 0  . Ensure Max Protein (ENSURE MAX PROTEIN) LIQD Take 330 mLs (11 oz total) by mouth 2 (two) times daily between meals.    . ferrous sulfate 325 (65 FE) MG tablet Take 1 tablet (325 mg total) by mouth 2 (two) times daily with a meal. 619 tablet 0  . folic acid (FOLVITE) 1 MG tablet Take 1 tablet (1 mg total) by mouth daily.    . insulin aspart (NOVOLOG) 100 UNIT/ML injection Inject 0-8 Units into the skin as directed. Take 0 units if CBG 70-150 Take 1 units if CBG 151-200 Take 2 units if CBG 201-250 Take 3 units if CBG 251-300 Take 4 units if CBG 301-350 Take 6 units if CBG 351-400 If CBG > 400, give 8 units and call MD 10 mL   . insulin detemir (LEVEMIR) 100 UNIT/ML injection  Inject 0.1-0.15 mLs (10-15 Units total) into the skin at bedtime.    [name] levofloxacin (LEVAQUIN) 250 MG tablet Take 2 tablets (544m) by mouth x 1 day and then take 1 tablet (2542m by mouth daily x 6 days 8 tablet 0  . metoprolol succinate (TOPROL-XL) 25 MG 24 hr tablet Take 0.5 tablets (12.5 mg total) by mouth daily. 45 tablet 1  . multivitamin (RENA-VIT) TABS tablet Take 1 tablet by mouth at bedtime.  0  . polyethylene glycol (MIRALAX / GLYCOLAX) 17 g packet Take 17 g by mouth daily. 14 each  0  . promethazine (PHENERGAN) 25 MG tablet Take 1 tablet (25 mg total) by mouth every 6 (six) hours as needed for nausea or vomiting. 60 tablet 0  . rosuvastatin (CRESTOR) 10 MG tablet TAKE 1 TABLET (10 MG TOTAL) BY MOUTH DAILY. 90 tablet 3  . simethicone (MYLICON) 80 MG chewable tablet Chew 1 tablet (80 mg total) by mouth 4 (four) times daily as needed for flatulence. 30 tablet 0  . thiamine 100 MG tablet Take 1 tablet (100 mg total) by mouth daily.     No current facility-administered medications on file prior to visit.    ECOG PERFORMANCE STATUS:2  Today's Vitals   05/08/19 0932  BP: (!) 97/57  Pulse: 77  Resp: 16  Temp: (!) 95.8 F (35.4 C)  SpO2: 98%  Weight: 170 lb 9.6 oz (77.4 kg)  PainSc: 0-No pain   Body mass index is 23.14 kg/m.  Physical Exam Constitutional:      General: He is not in acute distress.    Comments: Patient sits in the wheelchair.  HENT:     Head: Normocephalic and atraumatic.  Eyes:     General: No scleral icterus.    Pupils: Pupils are equal, round, and reactive to light.  Cardiovascular:     Rate and Rhythm: Normal rate and regular rhythm.     Heart sounds: Normal heart sounds.  Pulmonary:     Effort: Pulmonary effort is normal. No respiratory distress.     Breath sounds: No wheezing.     Comments: Good air entry the right lower lung base. Decreased breath sound left lower lung. Abdominal:     General: Bowel sounds are normal. There is no distension.      Palpations: Abdomen is soft. There is no mass.     Tenderness: There is no abdominal tenderness.  Musculoskeletal:        General: No deformity. Normal range of motion.     Cervical back: Normal range of motion and neck supple.  Skin:    General: Skin is warm and dry.     Findings: No erythema or rash.  Neurological:     Mental Status: He is alert and oriented to person, place, and time. Mental status is at baseline.     Cranial Nerves: No cranial nerve deficit.     Coordination: Coordination normal.  Psychiatric:        Mood and Affect: Mood normal.     LABORATORY DATA:  I have reviewed the data as listed Lab Results  Component Value Date   WBC 18.9 (H) 11/09/2018   HGB 11.0 (L) 11/09/2018   HCT 33.5 (L) 11/09/2018   MCV 89.8 11/09/2018   PLT 581 (H) 11/09/2018   Recent Labs    10/19/18 0841 11/02/18 1238 11/09/18 0833  NA 137 132* 131*  K 4.0 4.0 3.5  CL 104 95* 94*  CO2 '24 22 26  ' GLUCOSE 252* 425* 361*  BUN '14 15 12  ' CREATININE 0.61 0.84 0.77  CALCIUM 8.5* 8.5* 8.0*  GFRNONAA >60 >60 >60  GFRAA >60 >60 >60  PROT 6.0* 7.0 6.5  ALBUMIN 3.0* 2.6* 2.3*  AST 14* 11* 12*  ALT '10 10 9  ' ALKPHOS 69 75 63  BILITOT 0.5 0.9 0.8   Iron/TIBC/Ferritin/ %Sat No results found for: IRON, TIBC, FERRITIN, IRONPCTSAT    RADIOGRAPHIC STUDIES: I have personally reviewed the radiological images as listed and agreed with the findings in the report.   DG Chest 1 View  Result Date: 03/14/2019 CLINICAL DATA:  Wheezing EXAM: CHEST  1 VIEW COMPARISON:  03/03/2019 FINDINGS: Borderline heart size.  CABG. Diffuse interstitial opacity with Kerley lines and left more than right pleural effusion. No pneumothorax. IMPRESSION: CHF. Electronically Signed   By: Monte Fantasia M.D.   On: 03/14/2019 05:47   DG Chest 2 View  Result Date: 05/01/2019 CLINICAL DATA:  Shortness of breath EXAM: CHEST - 2 VIEW COMPARISON:  March 29, 2019 FINDINGS: There is a persistent small left pleural effusion.  There is scarring and atelectasis throughout portions of the left mid and lower lung regions. There is new ill-defined airspace opacity in the right lower lobe. Heart is upper normal in size with pulmonary vascularity normal. Patient is status post coronary artery bypass grafting. Central catheter tip is in the superior vena cava. No pneumothorax. No bone lesions. IMPRESSION: New area of somewhat ill-defined opacity in the lateral right base, concerning for early pneumonia. Persistent fairly small left pleural effusion with atelectatic change in scarring left mid and lower lung zones. No new opacity on the left. Stable cardiac silhouette. Postoperative changes. Central catheter tip in superior vena cava without pneumothorax. Electronically Signed   By: Lowella Grip III M.D.   On: 05/01/2019 16:04   DG Chest 2 View  Result Date: 03/28/2019 CLINICAL DATA:  End-stage renal disease EXAM: CHEST - 2 VIEW COMPARISON:  03/26/2019 FINDINGS: Cardiac shadow is mildly enlarged but stable. Postsurgical changes and dialysis catheter are again seen. Bilateral pleural effusions are seen stable in appearance from the prior exam. Mild vascular congestion is again noted but slightly improved. No focal confluent infiltrate is seen. IMPRESSION: Slight improvement in the degree of vascular congestion. Bilateral pleural effusions stable from the prior exam. Electronically Signed   By: Inez Catalina M.D.   On: 03/28/2019 14:22   DG Chest 2 View  Result Date: 03/26/2019 CLINICAL DATA:  Shortness of breath EXAM: CHEST - 2 VIEW COMPARISON:  March 15, 2019 FINDINGS: Heart is mildly enlarged with pulmonary venous hypertension. There is a pleural effusion on the left. There is consolidation in the medial left base. There is mild interstitial edema present with atelectatic change in the left mid and lower lung zones. Patient is status post coronary artery bypass grafting. Central catheter tip is in the superior vena cava. No  pneumothorax. No bone lesions. IMPRESSION: Cardiomegaly with pulmonary vascular congestion. Left pleural effusion. There is a degree of interstitial edema. Suspect a degree of congestive heart failure. Consolidation in the medial left base may represent alveolar edema or pneumonia. Both entities may be present concurrently. Central catheter tip is in the superior vena cava. Patient is status post coronary artery bypass grafting. Electronically Signed   By: Lowella Grip III M.D.   On: 03/26/2019 16:56   CT HEAD WO CONTRAST  Result Date: 03/05/2019 CLINICAL DATA:  Metastatic disease evaluation.  Lung cancer. EXAM: CT HEAD WITHOUT CONTRAST TECHNIQUE: Contiguous axial images were obtained from the base of the skull through the vertex without intravenous contrast. COMPARISON:  MRI head 09/20/2018 FINDINGS: Brain: Mild atrophy. Mild hypodensity in the white matter unchanged from the prior MRI. No acute infarct, hemorrhage, mass. No edema or midline shift. Vascular: Negative for hyperdense vessel Skull: Negative skull. Sinuses/Orbits: Mucosal edema and bony thickening right maxillary sinus. Chronic nasal bone fracture. Left cataract extraction. Other: None IMPRESSION: No acute abnormality and negative for metastatic disease on unenhanced CT. Atrophy and mild chronic microvascular ischemia. Electronically Signed   By: Franchot Gallo  M.D.   On: 03/05/2019 15:28   CT CHEST WO CONTRAST  Result Date: 03/14/2019 CLINICAL DATA:  Respiratory failure. EXAM: CT CHEST WITHOUT CONTRAST TECHNIQUE: Multidetector CT imaging of the chest was performed following the standard protocol without IV contrast. COMPARISON:  Chest CT dated 01/22/2019. FINDINGS: Cardiovascular: Heart size appears stable. No pericardial effusion. Diffuse coronary artery calcifications. Surgical changes of median sternotomy for presumed CABG. Mediastinum/Nodes: No mass or enlarged lymph nodes seen within the mediastinum. Esophagus appears normal. Trachea  is unremarkable. Lungs/Pleura: New RIGHT pleural effusion, moderate to large in size. Associated compressive atelectasis within the RIGHT perihilar and lower lobe. New ill-defined consolidation within the LEFT lower lobe, suspected pneumonia. Probable additional rounded atelectasis within the lingula. Diffuse interstitial thickening indicating some degree of volume overload versus atypical infection. Upper Abdomen: Limited images of the upper abdomen are unremarkable. Musculoskeletal: No acute or suspicious osseous finding. IMPRESSION: 1. New ill-defined consolidation within the LEFT lower lobe, suspected pneumonia. 2. New moderate to large-sized RIGHT pleural effusion, with associated compressive atelectasis. 3. Diffuse interstitial thickening, most likely edema/fluid overload, less likely additional atypical infection. 4. Probable additional rounded atelectasis within the lingula. Aortic Atherosclerosis (ICD10-I70.0). Electronically Signed   By: Franki Cabot M.D.   On: 03/14/2019 19:47   US RENAL  Result Date: 03/10/2019 CLINICAL DATA:  64 year old presenting with acute renal failure. Evaluate for obstruction as a possible cause. EXAM: RENAL / URINARY TRACT ULTRASOUND COMPLETE COMPARISON:  Urinary tract ultrasound 03/04/2019. CT abdomen and pelvis 01/22/2019. FINDINGS: Right Kidney: Renal measurements: Approximately 12.6 x 6.4 x 6.2 cm = volume: 264 mL. Mildly echogenic parenchyma. No hydronephrosis. Well-preserved cortex. No shadowing calculi. No focal parenchymal abnormality. Left Kidney: Renal measurements: Approximately 12.5 x 6.3 x 6.9 cm = volume: 283 mL. Mildly echogenic parenchyma. No hydronephrosis. Well-preserved cortex. No shadowing calculi. No focal parenchymal abnormality. Bladder: Normal for degree of bladder distention. Other: None. IMPRESSION: 1. Mildly echogenic renal parenchyma indicative of medical renal disease. 2. Otherwise normal examination. Specifically, no evidence of urinary tract  obstruction to account for the acute renal insufficiency. Electronically Signed   By: Evangeline Dakin M.D.   On: 03/10/2019 15:02   US RENAL  Result Date: 03/04/2019 CLINICAL DATA:  Acute kidney injury EXAM: RENAL / URINARY TRACT ULTRASOUND COMPLETE COMPARISON:  CT 01/22/2019 FINDINGS: Right Kidney: Renal measurements: 11.4 x 6 x 6 cm = volume: 215 mL . Echogenicity within normal limits. No mass or hydronephrosis visualized. Left Kidney: Renal measurements: 11.8 x 7.3 x 6.5 cm = volume: 291.8 mL. Echogenicity within normal limits. No mass or hydronephrosis visualized. Bladder: Appears normal for degree of bladder distention. Other: Incidental note made of gallstones and small effusions. Heterogenous prostate. IMPRESSION: Normal ultrasound appearance of the kidneys Electronically Signed   By: Donavan Foil M.D.   On: 03/04/2019 15:37   PERIPHERAL VASCULAR CATHETERIZATION  Result Date: 03/14/2019 See op note  PERIPHERAL VASCULAR CATHETERIZATION  Result Date: 03/11/2019 See op note  DG Chest Port 1 View  Result Date: 03/29/2019 CLINICAL DATA:  Post thoracentesis. EXAM: PORTABLE CHEST 1 VIEW COMPARISON:  03/28/2019. FINDINGS: Post thoracentesis chest x-ray reveals no evidence of pneumothorax. Significant reduction in right-sided pleural effusion. Small left pleural effusion again noted. Bibasilar atelectasis/infiltrates again noted. Prior CABG. Right IJ line stable position. IMPRESSION: Post thoracentesis chest x-ray reveals no evidence of pneumothorax. Significant reduction in right-sided pleural effusion. Electronically Signed   By: [place]   On: 03/29/2019 15:01   DG Chest Port 1 View  Result Date:  03/15/2019 CLINICAL DATA:  Status post thoracentesis. EXAM: PORTABLE CHEST 1 VIEW COMPARISON:  03/14/2019 FINDINGS: Interval decrease right pleural effusion. No evidence for pneumothorax. Bibasilar atelectasis/infiltrate again noted with persistent small left pleural effusion. The  cardiopericardial silhouette is within normal limits for size. Pulmonary vascular congestion has decreased in the interval. Right IJ central line tip overlies the mid to distal SVC. The visualized bony structures of the thorax are intact. Telemetry leads overlie the chest. IMPRESSION: Interval decrease in right pleural effusion without evidence of pneumothorax. Decrease in pulmonary vascular congestion with persistent bibasilar atelectasis/infiltrate and small left pleural effusion. Electronically Signed   By: Misty Stanley M.D.   On: 03/15/2019 11:36   DG Chest Portable 1 View  Result Date: 03/03/2019 CLINICAL DATA:  Shortness of breath, since Monday worsening this morning around 1 a.m. EXAM: PORTABLE CHEST 1 VIEW COMPARISON:  12/19/2018 FINDINGS: Cardiomediastinal contours are stable following median sternotomy with persistent left-sided pleural effusion and pleural-parenchymal scarring. No new areas of consolidation or evidence of right-sided pleural effusion. No signs of acute bone process. IMPRESSION: Persistent left-sided pleural effusion and pleural-parenchymal scarring. Electronically Signed   By: Zetta Bills M.D.   On: 03/03/2019 13:59   ECHOCARDIOGRAM COMPLETE  Result Date: 03/15/2019    ECHOCARDIOGRAM REPORT   Patient Name:   Craig Koch Date of Exam: 03/15/2019 Medical Rec #:  [id number]       Height:       72.0 in Accession #:    [id number]      Weight:       201.4 lb Date of Birth:  [birth year]       BSA:          2.137 m Patient Age:    59 years        BP:           125/69 mmHg Patient Gender: M               HR:           83 bpm. Exam Location:  [facility] Procedure: 2D Echo, Cardiac Doppler and Color Doppler Indications:     Respiratory distress  History:         Patient has prior history of Echocardiogram examinations, most                  recent 03/04/2019. Prior CABG; Risk Factors:Hypertension and                  Diabetes.  Sonographer:     Sherrie Sport RDCS (AE) Referring Phys:  [id number]  Claiborne Billings A GRIFFITH Diagnosing Phys: Kathlyn Sacramento MD IMPRESSIONS  1. Left ventricular ejection fraction, by estimation, is 35 to 40%. The left ventricle has moderately decreased function. The left ventricle demonstrates regional wall motion abnormalities . The left ventricular internal cavity size was mildly dilated. There is mild left ventricular hypertrophy. Left ventricular diastolic parameters are consistent with Grade II diastolic dysfunction (pseudonormalization). There is moderate hypokinesis of the left ventricular, entire inferior wall.  2. Right ventricular systolic function is normal. The right ventricular size is normal. There is moderately elevated pulmonary artery systolic pressure. The estimated right ventricular systolic pressure is 48.1 mmHg.  3. Left atrial size was mildly dilated.  4. The mitral valve is normal in structure. Moderate mitral valve regurgitation. No evidence of mitral stenosis.  5. The aortic valve is abnormal. Aortic valve regurgitation is mild. Mild to moderate aortic valve sclerosis/calcification is present, without any evidence  of aortic stenosis.  6. The inferior vena cava is normal in size with greater than 50% respiratory variability, suggesting right atrial pressure of 3 mmHg. FINDINGS  Left Ventricle: Left ventricular ejection fraction, by estimation, is 35 to 40%. The left ventricle has moderately decreased function. The left ventricle demonstrates regional wall motion abnormalities. Moderate hypokinesis of the left ventricular, entire inferior wall. The left ventricular internal cavity size was mildly dilated. There is mild left ventricular hypertrophy. Left ventricular diastolic parameters are consistent with Grade II diastolic dysfunction (pseudonormalization). Right Ventricle: The right ventricular size is normal. No increase in right ventricular wall thickness. Right ventricular systolic function is normal. There is moderately elevated pulmonary artery systolic  pressure. The tricuspid regurgitant velocity is 3.37 m/s, and with an assumed right atrial pressure of 3 mmHg, the estimated right ventricular systolic pressure is 11.9 mmHg. Left Atrium: Left atrial size was mildly dilated. Right Atrium: Right atrial size was normal in size. Pericardium: There is no evidence of pericardial effusion. Mitral Valve: The mitral valve is normal in structure. Normal mobility of the mitral valve leaflets. Moderate mitral valve regurgitation. No evidence of mitral valve stenosis. Tricuspid Valve: The tricuspid valve is normal in structure. Tricuspid valve regurgitation is mild . No evidence of tricuspid stenosis. Aortic Valve: The aortic valve is abnormal. Aortic valve regurgitation is mild. Mild to moderate aortic valve sclerosis/calcification is present, without any evidence of aortic stenosis. Aortic valve mean gradient measures 4.5 mmHg. Aortic valve peak gradient measures 8.4 mmHg. Aortic valve area, by VTI measures 2.54 cm. Pulmonic Valve: The pulmonic valve was normal in structure. Pulmonic valve regurgitation is not visualized. No evidence of pulmonic stenosis. Aorta: The aortic root is normal in size and structure. Venous: The inferior vena cava is normal in size with greater than 50% respiratory variability, suggesting right atrial pressure of 3 mmHg. IAS/Shunts: No atrial level shunt detected by color flow Doppler.  LEFT VENTRICLE PLAX 2D LVIDd:         5.34 cm      Diastology LVIDs:         4.38 cm      LV e' lateral:   5.77 cm/s LV PW:         1.00 cm      LV E/e' lateral: 22.4 LV IVS:        1.20 cm      LV e' medial:    5.00 cm/s LVOT diam:     2.00 cm      LV E/e' medial:  25.8 LV SV:         63 LV SV Index:   30 LVOT Area:     3.14 cm  LV Volumes (MOD) LV vol d, MOD A2C: 227.0 ml LV vol d, MOD A4C: 148.0 ml LV vol s, MOD A2C: 142.0 ml LV vol s, MOD A4C: 110.0 ml LV SV MOD A2C:     85.0 ml LV SV MOD A4C:     148.0 ml LV SV MOD BP:      59.1 ml RIGHT VENTRICLE RV Basal  diam:  4.14 cm RV S prime:     10.30 cm/s TAPSE (M-mode): 3.2 cm LEFT ATRIUM             Index       RIGHT ATRIUM           Index LA diam:        4.80 cm 2.25 cm/m  RA Area:  15.70 cm LA Vol (A2C):   68.8 ml 32.20 ml/m RA Volume:   40.00 ml  18.72 ml/m LA Vol (A4C):   77.9 ml 36.45 ml/m LA Biplane Vol: 75.1 ml 35.14 ml/m  AORTIC VALVE                   PULMONIC VALVE AV Area (Vmax):    2.41 cm    PV Vmax:        0.99 m/s AV Area (Vmean):   2.15 cm    PV Peak grad:   3.9 mmHg AV Area (VTI):     2.54 cm    RVOT Peak grad: 4 mmHg AV Vmax:           144.50 cm/s AV Vmean:          94.950 cm/s AV VTI:            0.249 m AV Peak Grad:      8.4 mmHg AV Mean Grad:      4.5 mmHg LVOT Vmax:         111.00 cm/s LVOT Vmean:        65.100 cm/s LVOT VTI:          0.201 m LVOT/AV VTI ratio: 0.81  AORTA Ao Root diam: 2.90 cm MITRAL VALVE                TRICUSPID VALVE MV Area (PHT): 5.13 cm     TR Peak grad:   45.4 mmHg MV Decel Time: 148 msec     TR Vmax:        337.00 cm/s MV E velocity: 129.00 cm/s MV A velocity: 68.40 cm/s   SHUNTS MV E/A ratio:  1.89         Systemic VTI:  0.20 m                             Systemic Diam: 2.00 cm Kathlyn Sacramento MD Electronically signed by Kathlyn Sacramento MD Signature Date/Time: 03/15/2019/11:27:06 AM    Final    ECHOCARDIOGRAM COMPLETE  Result Date: 03/04/2019    ECHOCARDIOGRAM REPORT   Patient Name:   Craig Koch Date of Exam: 03/04/2019 Medical Rec #:  [id number]       Height:       72.0 in Accession #:    [id number]      Weight:       185.0 lb Date of Birth:  October 02, 1955       BSA:          2.061 m Patient Age:    82 years        BP:           125/72 mmHg Patient Gender: M               HR:           119 bpm. Exam Location:  [facility] Procedure: 2D Echo, Color Doppler and Cardiac Doppler Indications:     I21.4 NSTEMI  History:         Patient has prior history of Echocardiogram examinations, most                  recent 08/24/2018. CAD, Prior CABG; Risk Factors:Hypertension,                   Dyslipidemia and Diabetes. Lung Cancer.  Sonographer:     Charmayne Sheer RDCS (AE)  Referring Phys:  [place] Phys: Kathlyn Sacramento MD IMPRESSIONS  1. Left ventricular ejection fraction, by estimation, is 30 to 35%. The left ventricle has moderately decreased function. The left ventricle demonstrates global hypokinesis. Left ventricular diastolic parameters are indeterminate.  2. Right ventricular systolic function is normal. The right ventricular size is normal. Tricuspid regurgitation signal is inadequate for assessing PA pressure.  3. Left atrial size was mild to moderately dilated.  4. The mitral valve is normal in structure and function. Moderate mitral valve regurgitation. No evidence of mitral stenosis.  5. The aortic valve is normal in structure and function. Aortic valve regurgitation is trivial. Mild to moderate aortic valve sclerosis/calcification is present, without any evidence of aortic stenosis.  6. The inferior vena cava is normal in size with greater than 50% respiratory variability, suggesting right atrial pressure of 3 mmHg. FINDINGS  Left Ventricle: Left ventricular ejection fraction, by estimation, is 30 to 35%. The left ventricle has moderately decreased function. The left ventricle demonstrates global hypokinesis. The left ventricular internal cavity size was normal in size. There is no left ventricular hypertrophy. Left ventricular diastolic parameters are indeterminate. Right Ventricle: The right ventricular size is normal. No increase in right ventricular wall thickness. Right ventricular systolic function is normal. Tricuspid regurgitation signal is inadequate for assessing PA pressure. Left Atrium: Left atrial size was mild to moderately dilated. Right Atrium: Right atrial size was normal in size. Pericardium: There is no evidence of pericardial effusion. Mitral Valve: The mitral valve is normal in structure and function. Normal mobility of the mitral valve  leaflets. Moderate mitral valve regurgitation. No evidence of mitral valve stenosis. MV peak gradient, 7.5 mmHg. The mean mitral valve gradient is 4.0  mmHg. Tricuspid Valve: The tricuspid valve is normal in structure. Tricuspid valve regurgitation is not demonstrated. No evidence of tricuspid stenosis. Aortic Valve: The aortic valve is normal in structure and function. Aortic valve regurgitation is trivial. Aortic regurgitation PHT measures 281 msec. Mild to moderate aortic valve sclerosis/calcification is present, without any evidence of aortic stenosis. Aortic valve mean gradient measures 5.0 mmHg. Aortic valve peak gradient measures 8.5 mmHg. Aortic valve area, by VTI measures 2.53 cm. Pulmonic Valve: The pulmonic valve was normal in structure. Pulmonic valve regurgitation is not visualized. No evidence of pulmonic stenosis. Aorta: The aortic root is normal in size and structure. Venous: The inferior vena cava is normal in size with greater than 50% respiratory variability, suggesting right atrial pressure of 3 mmHg. IAS/Shunts: No atrial level shunt detected by color flow Doppler.  LEFT VENTRICLE PLAX 2D LVIDd:         5.21 cm  Diastology LVIDs:         4.21 cm  LV e' lateral:   7.83 cm/s LV PW:         0.84 cm  LV E/e' lateral: 13.4 LV IVS:        0.89 cm  LV e' medial:    7.94 cm/s LVOT diam:     2.40 cm  LV E/e' medial:  13.2 LV SV:         54 LV SV Index:   26 LVOT Area:     4.52 cm  RIGHT VENTRICLE RV Basal diam:  3.08 cm LEFT ATRIUM             Index       RIGHT ATRIUM           Index LA diam:  4.70 cm 2.28 cm/m  RA Area:     14.10 cm LA Vol (A2C):   62.0 ml 30.08 ml/m RA Volume:   35.20 ml  17.08 ml/m LA Vol (A4C):   68.6 ml 33.29 ml/m LA Biplane Vol: 65.3 ml 31.68 ml/m  AORTIC VALVE                    PULMONIC VALVE AV Area (Vmax):    2.95 cm     PV Vmax:       1.10 m/s AV Area (Vmean):   2.86 cm     PV Vmean:      68.400 cm/s AV Area (VTI):     2.53 cm     PV VTI:        0.146 m AV  Vmax:           146.00 cm/s  PV Peak grad:  4.8 mmHg AV Vmean:          103.000 cm/s PV Mean grad:  2.0 mmHg AV VTI:            0.213 m AV Peak Grad:      8.5 mmHg AV Mean Grad:      5.0 mmHg LVOT Vmax:         95.30 cm/s LVOT Vmean:        65.200 cm/s LVOT VTI:          0.119 m LVOT/AV VTI ratio: 0.56 AI PHT:            281 msec  AORTA Ao Root diam: 3.00 cm MITRAL VALVE MV Area (PHT): 3.92 cm     SHUNTS MV Peak grad:  7.5 mmHg     Systemic VTI:  0.12 m MV Mean grad:  4.0 mmHg     Systemic Diam: 2.40 cm MV Vmax:       1.37 m/s MV Vmean:      92.7 cm/s MV Decel Time: 194 msec MV E velocity: 104.80 cm/s Kathlyn Sacramento MD Electronically signed by Kathlyn Sacramento MD Signature Date/Time: 03/04/2019/2:46:43 PM    Final    [place] RT UPPER EXTREM LTD SOFT TISSUE NON VASCULAR  Result Date: 03/08/2019 CLINICAL DATA:  Swelling and bruising of the right upper extremity. Possible hematoma. EXAM: ULTRASOUND right UPPER EXTREMITY LIMITED TECHNIQUE: Ultrasound examination of the upper extremity soft tissues was performed in the area of clinical concern. COMPARISON:  None. FINDINGS: Diffuse subcutaneous soft tissue swelling/edema and streaky fluid. No discrete fluid collection or hematoma. The underlying musculature is grossly normal. IMPRESSION: Diffuse subcutaneous soft tissue swelling/edema/streaky fluid but no discrete abscess or hematoma. Electronically Signed   By: Marijo Sanes M.D.   On: 03/08/2019 19:13   US THORACENTESIS ASP PLEURAL SPACE W/IMG GUIDE  Result Date: 03/29/2019 INDICATION: Pleural effusion. EXAM: ULTRASOUND GUIDED RIGHT THORACENTESIS MEDICATIONS: None. COMPLICATIONS: None immediate. PROCEDURE: An ultrasound guided thoracentesis was thoroughly discussed with the patient and questions answered. The benefits, risks, alternatives and complications were also discussed. The patient understands and wishes to proceed with the procedure. Written consent was obtained. Ultrasound was performed to localize and mark an  adequate pocket of fluid in the right chest. The area was then prepped and draped in the normal sterile fashion. 1% Lidocaine was used for local anesthesia. Under ultrasound guidance a 6 French catheter was introduced. Thoracentesis was performed. The catheter was removed and a dressing applied. FINDINGS: A total of approximately 1 L of yellow fluid was removed. Samples were sent to  the laboratory as requested by the clinical team. IMPRESSION: Successful ultrasound guided right thoracentesis yielding 1 L of pleural fluid. Electronically Signed   By: Marcello Moores  Register   On: 03/29/2019 15:04   US THORACENTESIS ASP PLEURAL SPACE W/IMG GUIDE  Result Date: 03/15/2019 INDICATION: History of lung cancer with recurrent right pleural effusion. Request for diagnostic and therapeutic thoracentesis. EXAM: ULTRASOUND GUIDED RIGHT THORACENTESIS MEDICATIONS: 1% lidocaine 10 mL COMPLICATIONS: None immediate. PROCEDURE: An ultrasound guided thoracentesis was thoroughly discussed with the patient and questions answered. The benefits, risks, alternatives and complications were also discussed. The patient understands and wishes to proceed with the procedure. Written consent was obtained. Ultrasound was performed to localize and mark an adequate pocket of fluid in the right chest. The area was then prepped and draped in the normal sterile fashion. 1% Lidocaine was used for local anesthesia. Under ultrasound guidance a 6 Fr Safe-T-Centesis catheter was introduced. Thoracentesis was performed. The catheter was removed and a dressing applied. FINDINGS: A total of approximately 2.7 L of clear yellow fluid was removed. Samples were sent to the laboratory as requested by the clinical team. IMPRESSION: Successful ultrasound guided right thoracentesis yielding 2.7 L of pleural fluid. No pneumothorax on post-procedure chest x-ray. Read by: Gareth Eagle, PA-C Electronically Signed   By: Sandi Mariscal M.D.   On: 03/15/2019 12:44     ASSESSMENT  & PLAN:  1. Malignant neoplasm of unspecified part of unspecified bronchus or lung ([place])   2. Encounter for antineoplastic immunotherapy   3. Weight loss   4. Chronic obstructive pulmonary disease, unspecified COPD type ([place])   5. 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 BRAF targeted therapy was discontinued due to patient's recent acute illness including acute CHF, DKA, renal failure on hemodialysis.  Patient clinically is doing better.  Off hemodialysis. Pneumonia was treated with a course of oral antibiotics and improved .  Patient is willing to proceed with immunotherapy treatment with [facility]. Labs reviewed and discussed with patient.  Proceed with today's treatment.  X-ray was reviewed and discussed with patient.  Left lung fairly small pleural effusion stable. I will see patient in 10 days for evaluation.  I recommend patient to follow-up with nutritionist and he declined. Diabetes, glucose is stable at 151 today. CKD4, continue follow-up with nephrologist.  Earlie Server, MD, PhD Hematology Oncology [place] at [facility]  05/08/2019

## 2019-05-08 NOTE — Progress Notes (Signed)
Patient reports that his appetite has improved and he feels that his strength is improving as well.

## 2019-05-08 NOTE — Progress Notes (Signed)
Creatinine 1.62, Per Dr. Tasia Catchings okay to proceed with treatment.

## 2019-05-08 NOTE — Progress Notes (Signed)
Cr 1.62 ok to proceed per md

## 2019-05-10 ENCOUNTER — Other Ambulatory Visit: Payer: Self-pay

## 2019-05-10 ENCOUNTER — Ambulatory Visit (INDEPENDENT_AMBULATORY_CARE_PROVIDER_SITE_OTHER): Payer: Medicare Other | Admitting: Physician Assistant

## 2019-05-10 ENCOUNTER — Other Ambulatory Visit: Payer: Self-pay | Admitting: Oncology

## 2019-05-10 ENCOUNTER — Encounter: Payer: Self-pay | Admitting: Physician Assistant

## 2019-05-10 VITALS — BP 90/50 | HR 84 | Ht 72.0 in | Wt 176.1 lb

## 2019-05-10 DIAGNOSIS — I2510 Atherosclerotic heart disease of native coronary artery without angina pectoris: Secondary | ICD-10-CM | POA: Diagnosis not present

## 2019-05-10 DIAGNOSIS — I5022 Chronic systolic (congestive) heart failure: Secondary | ICD-10-CM

## 2019-05-10 DIAGNOSIS — Z992 Dependence on renal dialysis: Secondary | ICD-10-CM

## 2019-05-10 DIAGNOSIS — I480 Paroxysmal atrial fibrillation: Secondary | ICD-10-CM

## 2019-05-10 DIAGNOSIS — I10 Essential (primary) hypertension: Secondary | ICD-10-CM | POA: Diagnosis not present

## 2019-05-10 DIAGNOSIS — N186 End stage renal disease: Secondary | ICD-10-CM

## 2019-05-10 DIAGNOSIS — Z87448 Personal history of other diseases of urinary system: Secondary | ICD-10-CM

## 2019-05-10 DIAGNOSIS — C3490 Malignant neoplasm of unspecified part of unspecified bronchus or lung: Secondary | ICD-10-CM

## 2019-05-10 DIAGNOSIS — Z5181 Encounter for therapeutic drug level monitoring: Secondary | ICD-10-CM

## 2019-05-10 DIAGNOSIS — Z7901 Long term (current) use of anticoagulants: Secondary | ICD-10-CM

## 2019-05-10 DIAGNOSIS — Z794 Long term (current) use of insulin: Secondary | ICD-10-CM

## 2019-05-10 DIAGNOSIS — I252 Old myocardial infarction: Secondary | ICD-10-CM

## 2019-05-10 DIAGNOSIS — Z951 Presence of aortocoronary bypass graft: Secondary | ICD-10-CM | POA: Diagnosis not present

## 2019-05-10 DIAGNOSIS — Z5112 Encounter for antineoplastic immunotherapy: Secondary | ICD-10-CM

## 2019-05-10 DIAGNOSIS — E1122 Type 2 diabetes mellitus with diabetic chronic kidney disease: Secondary | ICD-10-CM

## 2019-05-10 MED ORDER — APIXABAN 5 MG PO TABS: 5.0000 mg | ORAL_TABLET | Freq: Two times a day (BID) | ORAL | 5 refills | Status: DC

## 2019-05-10 MED ORDER — EZETIMIBE 10 MG PO TABS
10.0000 mg | ORAL_TABLET | Freq: Every day | ORAL | 5 refills | Status: DC
Start: 2019-05-10 — End: 2019-12-30

## 2019-05-10 NOTE — Patient Instructions (Signed)
Medication Instructions:  Your physician has recommended you make the following change in your medication:   RESTART Eliquis 5 mg twice daily. An Rx has been sent to your pharmacy. You have been given a 30 day free card for Eliquis.  START Zetia 10 mg daily. An Rx has been sent to your pharmacy.  *If you need a refill on your cardiac medications before your next appointment, please call your pharmacy*   Lab Work: None ordered If you have labs (blood work) drawn today and your tests are completely normal, you will receive your results only by: [name] MyChart Message (if you have MyChart) OR . A paper copy in the mail If you have any lab test that is abnormal or we need to change your treatment, we will call you to review the results.   Testing/Procedures: None ordered   Follow-Up: At [facility], you and your health needs are our priority.  As part of our continuing mission to provide you with exceptional heart care, we have created designated Provider Care Teams.  These Care Teams include your primary Cardiologist (physician) and Advanced Practice Providers (APPs -  Physician Assistants and Nurse Practitioners) who all work together to provide you with the care you need, when you need it.  We recommend signing up for the patient portal called "MyChart".  Sign up information is provided on this After Visit Summary.  MyChart is used to connect with patients for Virtual Visits (Telemedicine).  Patients are able to view lab/test results, encounter notes, upcoming appointments, etc.  Non-urgent messages can be sent to your provider as well.   To learn more about what you can do with MyChart, go to [email or web address].    Your next appointment:   2 week(s)  The format for your next appointment:   In Person  Provider:    You may see Ida Rogue, MD or one of the following Advanced Practice Providers on your designated Care Team:    Murray Hodgkins, NP  Christell Faith,  PA-C  Marrianne Mood, PA-C    Other Instructions N/A

## 2019-05-10 NOTE — Progress Notes (Addendum)
Office Visit    Patient Name: Craig Koch Date of Encounter: 05/10/2019  Primary Care Provider:  Tonia Ghent, MD Primary Cardiologist:  Ida Rogue, MD  Chief Complaint    Chief Complaint  Patient presents with  . office visit    12 month F/U; Meds verbally reviewed with patient's wife.    64 yo male with history of CAD s/p four-vessel CABG, HFrEF, A. 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  Past Medical History    Past Medical History:  Diagnosis Date  . Anxiety   . Arthritis   . CHF (congestive heart failure) ([place])   . Coronary artery disease   . Diabetes mellitus   . Dyslipidemia   . Hx of CABG   . Hyperlipidemia   . Hypertension   . Malignant neoplasm of unspecified part of unspecified bronchus or lung ([place]) 08/2018   Immunotherapy  . Pleural effusion    Past Surgical History:  Procedure Laterality Date  . CATARACT EXTRACTION Left 09/2015  . CHEST TUBE INSERTION Left 10/01/2018   Procedure: INSERTION PLEURAL DRAINAGE CATHETER;  Surgeon: [name], MD;  Location: [facility] ORS;  Service: Thoracic;  Laterality: Left;  . CORONARY ARTERY BYPASS GRAFT  2004   (CABG with LIMA to the  LAD, SVG to OM2/OM3, SVG  to diag  . DIALYSIS/PERMA CATHETER INSERTION N/A 03/14/2019   Procedure: DIALYSIS/PERMA CATHETER INSERTION;  Surgeon: Algernon Huxley, MD;  Location: [place] [facility];  Service: Cardiovascular;  Laterality: N/A;  . Left ankle surgery     repair of fracture  . Right lower leg surgery     rod  . TEMPORARY DIALYSIS CATHETER N/A 03/11/2019   Procedure: TEMPORARY DIALYSIS CATHETER;  Surgeon: Algernon Huxley, MD;  Location: [place] [facility];  Service: Cardiovascular;  Laterality: N/A;    Allergies  Allergies  Allergen Reactions  . Lipitor [Atorvastatin Calcium] Other (See Comments)    Aches.  Tolerated crestor.     History of  Present Illness    Craig Koch is a 64 y.o. male with PMH as above.  He has history of 2010 stress test with EF 45% and fixed defect involving the inferior wall, most consistent with scar versus hibernating myocardium. Per patient, he also had a stress test in 2012 ruled normal. He is a current smoker at 1/2 pack daily.   He was admitted to [facility] 08/2018 with large pleural effusioon and underwent thoracentesis. 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 He has reportedly been followed by oncology and responded well to treatment thus far.   He was seen in the office 09/25/2017 and denied chest pain.  Recommendations were to continue current medication regimen. Weight loss, dietary changes, and smoking cessation were advised.   He most recently was seen at [facility] 03/03/19 after presenting with SOB and found to be in new onset Afib with RVR, AKI requiring HD, NSTEMI, and new HFrEF. It was thought his Afib with RVR was in the setting of underlying metabolic abnormalities.  Rate control was limited by blood pressure.  He was started on amiodarone with improvement in ventricular rate.  He also received several doses of digoxin; however, this was discontinued due to his renal failure.  High-sensitivity troponin elevated greater than 27,000 with suspicion for graft occlusion.  Echo showed moderately reduced LVSF.  He was continued on  medical therapy and heparin given his renal function and cath deferred.  He had severe hyperglycemia noted as well as hyponatremia.  Temporary dialysis catheter was placed and removed by the end of admission with tunneled right IJ hemodialysis catheter placed.  He was discharged 03/19/19 on amiodarone 200 mg daily, apixaban 5 mg twice daily (not actually prescribed at discharge), ASA 81 mg daily (changed to Plavix at discharge), rosuvastatin, and Toprol with recommendation for follow-up in the office. ACE/ARB/ARNI was not added given his soft BP and renal function.    Today, he presents to clinic and is doing well. This is reportedly his first follow-up in the office since his discharge in March 2021. He reports he is trying to quit smoking and currently smoking 1 cigarette daily. He is following regularly with both oncology and nephrology. He reports feeling stronger since discharge with some appetite and weight increase attributed to increased caloric intake. He is walking regularly now, given his increased strength. He states his renal function is improving, and he has continued HD and is monitored closely by the HD team. He states that he may soon be able to stop HD soon and thus remove his IJ HD catheter. He is asx with current hypotension noted today (BP 90/50). He reportshome SBP is usually similar to slightly higher with SBP 90s to low 100s. He states his current SBP may be lower due to recent HD tx the previous day. He verifies his SBP is monitored closely at HD. On review of previous clinic BP, usual SBP is noted as SBP 90s-low 100s. He denies any sx with this low BP today or in the past. No reported chest pain, palpitations, dyspnea, pnd, orthopnea, n, v, dizziness, syncope, edema, or early satiety. Unfortunately, he has not been maintained on anticoagulation with Eliquis, as this prescription was discontinued at discharge for unclear reasons. On review of EMR, Eliquis was listed on his AVS at discharge but with the script discontinued. Also noted was that his ASA was discontinued and he was started on Plavix. He denies any recent or current s/sx of bleeding. To be certain that his [place] was not discontinued for any medical reason, Oncology was contacted during today's visit and verified that this medication was not discontinued because of an oncology related recommendation. Of note, with restart of [place] today, it was noted that cost may be a barrier to ongoing use of Eliquis with coupon provided and family stating they will contact us if insurance is unable to cover  this medication. No s/sx of stroke off of [place].  He reports medication compliance, other than the discontinued Eliquis as above.  Home Medications    Prior to Admission medications   Medication Sig Start Date End Date Taking? Authorizing Provider  acetaminophen (TYLENOL) 325 MG tablet Take 2 tablets (650 mg total) by mouth every 6 (six) hours as needed for mild pain or fever. 03/19/19   Ezekiel Slocumb, DO  albuterol (VENTOLIN HFA) 108 (90 Base) MCG/ACT inhaler Inhale 2 puffs into the lungs every 6 (six) hours as needed for wheezing or shortness of breath. 04/01/19 05/08/19  Wyvonnia Dusky, MD  amiodarone (PACERONE) 200 MG tablet Take 1 tablet (200 mg total) by mouth daily. 04/23/19 05/23/19  Tonia Ghent, MD  clopidogrel (PLAVIX) 75 MG tablet Take 1 tablet (75 mg total) by mouth daily. 04/23/19 05/23/19  Tonia Ghent, MD  dextromethorphan-guaiFENesin [facility] DM) 30-600 MG 12hr tablet Take 1 tablet by mouth 2 (two) times daily. 03/19/19  Nicole Kindred A, DO  dronabinol (MARINOL) 5 MG capsule Take 1 capsule (5 mg total) by mouth 2 (two) times daily before a meal. 04/17/19 05/17/19  Earlie Server, MD  DULoxetine (CYMBALTA) 30 MG capsule Take 1 capsule (30 mg total) by mouth daily. 04/01/19 05/08/19  Wyvonnia Dusky, MD  Ensure Max Protein (ENSURE MAX PROTEIN) LIQD Take 330 mLs (11 oz total) by mouth 2 (two) times daily between meals. 03/19/19   Nicole Kindred A, DO  ferrous sulfate 325 (65 FE) MG tablet Take 1 tablet (325 mg total) by mouth 2 (two) times daily with a meal. 04/23/19 05/23/19  Tonia Ghent, MD  folic acid (FOLVITE) 1 MG tablet Take 1 tablet (1 mg total) by mouth daily. 03/20/19   Nicole Kindred A, DO  insulin aspart (NOVOLOG) 100 UNIT/ML injection Inject 0-8 Units into the skin as directed. Take 0 units if CBG 70-150 Take 1 units if CBG 151-200 Take 2 units if CBG 201-250 Take 3 units if CBG 251-300 Take 4 units if CBG 301-350 Take 6 units if CBG 351-400 If CBG > 400, give 8  units and call MD 04/15/19   Tonia Ghent, MD  insulin detemir (LEVEMIR) 100 UNIT/ML injection Inject 0.1-0.15 mLs (10-15 Units total) into the skin at bedtime. 04/12/19   Tonia Ghent, MD  levofloxacin (LEVAQUIN) 250 MG tablet Take 2 tablets (500mg ) by mouth x 1 day and then take 1 tablet (250mg ) by mouth daily x 6 days 05/02/19   Borders, Kirt Boys, NP  metoprolol succinate (TOPROL-XL) 25 MG 24 hr tablet Take 0.5 tablets (12.5 mg total) by mouth daily. 04/15/19   Tonia Ghent, MD  multivitamin (RENA-VIT) TABS tablet Take 1 tablet by mouth at bedtime. 03/19/19   Ezekiel Slocumb, DO  polyethylene glycol (MIRALAX / GLYCOLAX) 17 g packet Take 17 g by mouth daily. 03/20/19   Ezekiel Slocumb, DO  promethazine (PHENERGAN) 25 MG tablet Take 1 tablet (25 mg total) by mouth every 6 (six) hours as needed for nausea or vomiting. 04/08/19   Earlie Server, MD  rosuvastatin (CRESTOR) 10 MG tablet TAKE 1 TABLET (10 MG TOTAL) BY MOUTH DAILY. 01/07/19   Tonia Ghent, MD  simethicone (MYLICON) 80 MG chewable tablet Chew 1 tablet (80 mg total) by mouth 4 (four) times daily as needed for flatulence. 03/19/19   Nicole Kindred A, DO  thiamine 100 MG tablet Take 1 tablet (100 mg total) by mouth daily. 03/20/19   Ezekiel Slocumb, DO    Review of Systems    He denies chest pain, palpitations, dyspnea, pnd, orthopnea, n, v, dizziness, syncope, edema, or early satiety. He reports recent weight gain due to caloric intake. He reports melena due to iron supplementation and not due to bleeding. All other s/sx of bleeding denied.  All other systems reviewed and are otherwise negative except as noted above.  Physical Exam    VS:  BP (!) 90/50 (BP Location: Right Arm, Patient Position: Sitting, Cuff Size: Normal)   Pulse 84   Ht 6' (1.829 m)   Wt 176 lb 2 oz (79.9 kg)   SpO2 99%   BMI 23.89 kg/m  , BMI Body mass index is 23.89 kg/m. GEN: Frail and elderly male. Sitting in wheelchair.  HEENT: normal. Neck: Supple, no  JVD, carotid bruits, or masses. Cardiac: RRR, 1/6 systolic murmur. No rubs or gallops. No clubbing, cyanosis, edema.  Radials/DP/PT 1+ and equal bilaterally.  Respiratory:  Respirations regular and  unlabored, decreased bilateral breath sounds. GI: Soft, nontender, nondistended, BS + x 4. MS: no deformity or atrophy. Skin: warm and dry, no rash. Neuro:  Strength and sensation are intact. Psych: Normal affect.  Accessory Clinical Findings    ECG personally reviewed by me today -NSR, 84 bpm, previous inferior lateral and anterior infarcts, prolonged PR interval 196 ms, prolonged QRS interval 110 ms, prolonged QTC 489 ms, when compared with 03/04/2019 tracing, resolution of PVCs- no acute changes.  VITALS Reviewed today   Temp Readings from Last 3 Encounters:  05/08/19 (!) 95.8 F (35.4 C)  05/01/19 (!) 95.5 F (35.3 C)  04/17/19 (!) 95.5 F (35.3 C)   BP Readings from Last 3 Encounters:  05/10/19 (!) 90/50  05/08/19 (!) 97/57  05/01/19 103/63   Pulse Readings from Last 3 Encounters:  05/10/19 84  05/08/19 77  05/01/19 84    Wt Readings from Last 3 Encounters:  05/10/19 176 lb 2 oz (79.9 kg)  05/08/19 170 lb 9.6 oz (77.4 kg)  05/01/19 171 lb (77.6 kg)     LABS  reviewed today   CareEverwhere Labs present and most recent? Yes/No: No  Lab Results  Component Value Date   WBC 5.1 05/08/2019   HGB 10.0 (L) 05/08/2019   HCT 29.9 (L) 05/08/2019   MCV 90.9 05/08/2019   PLT 364 05/08/2019   Lab Results  Component Value Date   CREATININE 1.62 (H) 05/08/2019   BUN 25 (H) 05/08/2019   NA 137 05/08/2019   K 3.5 05/08/2019   CL 97 (L) 05/08/2019   CO2 28 05/08/2019   Lab Results  Component Value Date   ALT 14 05/08/2019   AST 16 05/08/2019   ALKPHOS 142 (H) 05/08/2019   BILITOT 0.5 05/08/2019   Lab Results  Component Value Date   CHOL 138 03/04/2019   HDL 15 (L) 03/04/2019   LDLCALC 81 03/04/2019   LDLDIRECT 84.0 08/18/2015   TRIG 209 (H) 03/04/2019   CHOLHDL 9.2  03/04/2019    Lab Results  Component Value Date   HGBA1C 8.8 (H) 03/26/2019   Lab Results  Component Value Date   TSH 3.184 03/04/2019     STUDIES/PROCEDURES reviewed today   2D echo 03/04/2019: 1. Left ventricular ejection fraction, by estimation, is 30 to 35%. The  left ventricle has moderately decreased function. The left ventricle  demonstrates global hypokinesis. Left ventricular diastolic parameters are  indeterminate.  2. Right ventricular systolic function is normal. The right ventricular  size is normal. Tricuspid regurgitation signal is inadequate for assessing  PA pressure.  3. Left atrial size was mild to moderately dilated.  4. The mitral valve is normal in structure and function. Moderate mitral  valve regurgitation. No evidence of mitral stenosis.  5. The aortic valve is normal in structure and function. Aortic valve  regurgitation is trivial. Mild to moderate aortic valve  sclerosis/calcification is present, without any evidence of aortic  stenosis.  6. The inferior vena cava is normal in size with greater than 50%  respiratory variability, suggesting right atrial pressure of 3 mmHg.   Assessment & Plan    Paroxysmal atrial fibrillation --Maintaining NSR on amiodarone 200mg  daily. Close monitoring of QTc on amiodarone recommended. Rate well controlled on current BB. Asx hypotension noted with recommendations as below. BP monitored at HD and home SBP reportedly low 100s. Consider low BP as 2/2 low output heart failure with HD tx yesterday. Continue to monitor.  --Restart Eliquis 5mg  BID, which was  not continued at time of last discharge for reasons unclear (oncology verified today that this was not due to their recommendation). Eliquis coupon provided today given concern expressed regarding ongoing cost of [place] / insurance coverage. No recent s/sx of bleeding or sx concerning for stroke off of [place]. They will contact us if insurance does not cover Eliquis.     --Regular recheck of CBC and BMET by Oncology and Nephrology. Message sent today to request recheck of TSH at next Oncology collection, given ongoing amiodarone tx and confirmed as to be collected. 03/04/19 TSH 3.184.  Hypotension, asymptomatic  --Patient denies any sx with current hypotension. As above, he reports higher home SBP and that HD monitors his BP and renal function closely as well. Clinic Bps in the past have been low as well. Consider low BP 2/2 low output heat failure with current HD with recentHD  tx the previous day. With ongoing or symptomatic hypotension, may need to consider decreasing or holding his BB to prevent prerenal AKI. Also considered is ongoing amiodarone use. Will revisit at RTC. Escalation of GDMT limited by current hypotension.   CAD with CABG x4 --No anginal sx reported. As above in HPI, recent NSTEMI but not a candidate for cath / PCI due to his comorbid conditions (renal function, stage IV lung CA). Eliquis 5mg  BID restarted today and in place of ASA 81mg . Of note, he is also on Plavix 75mg  daily. Continue BB as BP allows, as well as rosuvastatin and newly started Zetia for risk factor modification. Escalation of GDMT with ACE/ARB/ARNI given reduced EF 30-35% limited by current hypotension.   Chronic HFrEF --Euvolemic on exam today with volume status currently managed by nephrology / HD. Most recent echo as above with EF 30-35%. He does report some increase in strength with increased walking and intake of food. Continue current Toprol 12.5mg  daily as BP allows. Escalation of GDMT with ACE/ARB/ARNI limited by current hypotension.   HLD --Continue current statin. Most recent 03/04/19 LDL 84 and above goal of <70; however, he does have a reported statin intolerance. Therefore, will continue current Crestor 10mg  and add Zetia 10mg  daily today with a recheck of lipids in 6-8 weeks to ensure control of LDL.  Most recent liver function wnl.   Renal insufficiency / Current  HD --Remains on HD with plans for removal of catheter in the near future. Most recent 05/08/19 Cr 1.62 with BUN 25. Further recommendations and monitoring per HD at this time and to include monitoring and repletion of electrolytes as well as directly below.   Hypokalemia --K 3.5. Discussed repletion with goal 4.0. Defer to nephrology given ongoing HD tx at this time. Reassess at RTC.  Anemia --Hgb stable and denies any signs of bleeding. Consider anemia as 2/2 chronic illness. Monitored closely by both oncology and nephrology. Most recent Hgb 10.0 and Hct 29.9. We will continue to monitor this blood work as well, given ongoing tx with Eliquis and Plavix. Continue current iron supplementation.  DM2, poorly controlled --Recommend glycemic control for risk factor modification. Most recent Hgb A1C elevated at 8.8 on 03/26/19.  Stage IV lung CA --Followed closely by oncology with regular blood work. Per oncology. Smoking cessation advised.  Current smoker --Currently smoking 1 cigarette daily. Smoking cessation advised.   Medication changes: Restart Eliquis 5mg  BID. Coupon provided. They will let us know if cannot afford Eliquis. Start Zetia 10mg  daily.  Labs ordered: Oncology messaged to collect TSH and agreeable to collect this lab in addition  to regular CBC/BMET per oncology and nephrology. Recheck lipids in 6-8 weeks.  Studies / Imaging ordered: None. Future considerations: If BP allows, escalate GDMT. Recheck lipids in 6-8 weeks.  Disposition: RTC 2 weeks given restart of [place].  Arvil Chaco, PA-C 05/10/2019

## 2019-05-12 ENCOUNTER — Other Ambulatory Visit: Payer: Self-pay | Admitting: Family Medicine

## 2019-05-13 NOTE — Telephone Encounter (Signed)
Electronic refill request. Humalog The original prescription was discontinued on 03/19/2019 by Ezekiel Slocumb, DO for the following reason: Stop Taking at Discharge. Renewing this prescription may not be appropriate. Last office visit:   04/12/2019 Last Filled:   03/29/2018 30 mLs  3 RF Please advise.

## 2019-05-14 ENCOUNTER — Other Ambulatory Visit: Payer: Self-pay

## 2019-05-14 ENCOUNTER — Ambulatory Visit (INDEPENDENT_AMBULATORY_CARE_PROVIDER_SITE_OTHER): Payer: Medicare Other | Admitting: Family Medicine

## 2019-05-14 ENCOUNTER — Encounter: Payer: Self-pay | Admitting: Family Medicine

## 2019-05-14 ENCOUNTER — Ambulatory Visit (INDEPENDENT_AMBULATORY_CARE_PROVIDER_SITE_OTHER)
Admission: RE | Admit: 2019-05-14 | Discharge: 2019-05-14 | Disposition: A | Discharge status: Home / Self Care | Payer: Medicare Other | Source: Ambulatory Visit | Attending: Family Medicine | Admitting: Family Medicine

## 2019-05-14 VITALS — BP 116/66 | HR 84 | Temp 97.2°F | Ht 72.0 in | Wt 177.2 lb

## 2019-05-14 DIAGNOSIS — E119 Type 2 diabetes mellitus without complications: Secondary | ICD-10-CM | POA: Diagnosis not present

## 2019-05-14 DIAGNOSIS — R0602 Shortness of breath: Secondary | ICD-10-CM

## 2019-05-14 DIAGNOSIS — J189 Pneumonia, unspecified organism: Secondary | ICD-10-CM

## 2019-05-14 DIAGNOSIS — E118 Type 2 diabetes mellitus with unspecified complications: Secondary | ICD-10-CM | POA: Diagnosis not present

## 2019-05-14 DIAGNOSIS — E782 Mixed hyperlipidemia: Secondary | ICD-10-CM

## 2019-05-14 DIAGNOSIS — I480 Paroxysmal atrial fibrillation: Secondary | ICD-10-CM

## 2019-05-14 LAB — POCT GLYCOSYLATED HEMOGLOBIN (HGB A1C): Hemoglobin A1C: 7.2 % — AB (ref 4.0–5.6)

## 2019-05-14 MED ORDER — DOXYCYCLINE HYCLATE 100 MG PO TABS
100.0000 mg | ORAL_TABLET | Freq: Two times a day (BID) | ORAL | 0 refills | Status: DC
Start: 2019-05-14 — End: 2019-06-14

## 2019-05-14 MED ORDER — INSULIN DETEMIR 100 UNIT/ML ~~LOC~~ SOLN: 15.0000 [IU] | Freq: Every day | SUBCUTANEOUS | Status: DC

## 2019-05-14 MED ORDER — AMOXICILLIN-POT CLAVULANATE 875-125 MG PO TABS: 1.0000 | ORAL_TABLET | Freq: Two times a day (BID) | ORAL | 0 refills | Status: DC

## 2019-05-14 NOTE — Progress Notes (Signed)
This visit occurred during the SARS-CoV-2 public health emergency.  Safety protocols were in place, including screening questions prior to the visit, additional usage of staff PPE, and extensive cleaning of exam room while observing appropriate contact time as indicated for disinfecting solutions.  His breathing had gotten better but then regressed more recently.  In wheelchair today.  He can walk room to room at home but with more difficult recently, better with rest.   Recent CXR with IMPRESSION: New area of somewhat ill-defined opacity in the lateral right base, concerning for early pneumonia. Persistent fairly small left pleural effusion with atelectatic change in scarring left mid and lower lung zones. No new opacity on the left.  Stable cardiac silhouette. Postoperative changes. Central catheter tip in superior vena cava without pneumothorax. ==================  He was treated with abx, off abx currently.  No fevers.  Some sputum now, usually not colored.    He is off dialysis now with renal f/u pending, still with port in place. He isn't having more swelling.    He isn't on eliquis yet, but is still on plavix.  Patient wanted to verify need for both.  I can verify with cardiology.    Discussed starting zetia.    A1c improved.  Sugar was 121 this AM. He has occ lows 1-2 times a week, early AM, then occ highs.  Taking 20 Levemir units at night. Discussed tapering levemir.  Goal AM sugar >100.  Still on sliding scale insulin at baseline with meals.  Meds, vitals, and allergies reviewed.   ROS: Per HPI unless specifically indicated in ROS section   GEN: nad, alert and oriented, chronically ill-appearing but not acutely ill-appearing, in wheelchair. HEENT: NCAT NECK: supple w/o LA CV: rrr.  PULM: Clear to auscultation on the right side but decreased breath sounds in the left lower lobe, no wheeze, no inc wob ABD: soft, +bs EXT: Trace BLE edema SKIN: no acute rash

## 2019-05-14 NOTE — Patient Instructions (Addendum)
Cut back on levemir and update me about your sugar in the next week.   Go to the lab on the way out for a chest xray.  I'll check on your other meds.  Take care.  Glad to see you. To ER if worse.

## 2019-05-14 NOTE — Telephone Encounter (Signed)
Patient is coming in for an OV today.  Will discuss at that time.

## 2019-05-14 NOTE — Telephone Encounter (Signed)
Please check with patient. I thought he was switched to novolog (in addition to levemir) and he should have rx for that.  I denied this rx for humalog in the meantime.  Thanks.

## 2019-05-16 ENCOUNTER — Inpatient Hospital Stay: Payer: Medicare Other

## 2019-05-16 ENCOUNTER — Telehealth: Payer: Self-pay | Admitting: Family Medicine

## 2019-05-16 NOTE — Telephone Encounter (Signed)
Yes, please take augmentin and doxy.  Thanks.  Let me know if he isn't getting better.

## 2019-05-16 NOTE — Assessment & Plan Note (Signed)
Likely pneumonia on chest x-ray.  He was improving while on antibiotics but seems to be regressing off antibiotics in the meantime.  At this point still okay for outpatient follow-up.  Routine emergency room cautions given to patient.  See notes on chest x-ray.  Start doxycycline and Augmentin.  At least 30 minutes were devoted to patient care in this encounter (this can potentially include time spent reviewing the patient's file/history, interviewing and examining the patient, counseling/reviewing plan with patient, ordering referrals, ordering tests, reviewing relevant laboratory or x-ray data, and documenting the encounter).

## 2019-05-16 NOTE — Telephone Encounter (Signed)
Patient advised.   Patient says that so far, he has not felt much different but should be able to start the Amoxicillin today along with the Doxy and will call back on Monday with an update.

## 2019-05-16 NOTE — Assessment & Plan Note (Signed)
Reasonable to add on Zetia given his most recent lipid panel.

## 2019-05-16 NOTE — Telephone Encounter (Signed)
Update patient.  I checked back on the cardiology notes.  It is reasonable to add on Eliquis and Zetia.  I routed this note to cardiology as FYI in the meantime.

## 2019-05-16 NOTE — Telephone Encounter (Signed)
Patient called about 2 antibiotic that were called in at his visit  He stated when he went to the pharmacy they only had the Doxycycline in stock so he picked up and started taking as advised He stated they did not have the amoxacillin a that time but called him today and they have it ready for him.  Pt would like to know if you still wanted him to go ahead and pick up the amoxacillin and start,

## 2019-05-16 NOTE — Assessment & Plan Note (Signed)
Discussed tapering Levemir since his low sugars tend to happen in the early morning.  Goal AM sugar above 100.  Continue sliding scale insulin at baseline with meals.  He can update me about his sugar in the near future.

## 2019-05-16 NOTE — Telephone Encounter (Signed)
Patient advised.

## 2019-05-16 NOTE — Progress Notes (Signed)
Nutrition Follow-up:   Patient with stage IV lung cancer with metastatic disease.  Patient receiving [place].    Spoke with patient via phone for nutrition follow-up.  Patient reports that his appetite is better and he is eating well.  Reports yesterday ate egg, cheese, sausage omelet for breakfast.  Lunch was steak, french fries and coleslaw. Supper was hamburger and fries. Ate a bedtime snack of peanut butter crackers with almond milk.  Reports that he has been trying to add a snack to increase his weight.    Patient states that he is no longer receiving dialysis.   Denies any nutrition impact symptoms  Medications: marinol  Labs: reviewed  Anthropometrics:   Weight noted 177 lb 4 oz on 5/11 (Dr Josefine Class office) increased from 170 lb last weight at cancer center.    Patient denies swelling  NUTRITION DIAGNOSIS: Inadequate oral intake improving   INTERVENTION:  Continue eating well-balanced diet including good sources of protein.  Continue bedtime snack for extra calories and protein Patient has contact information    MONITORING, EVALUATION, GOAL: weight trends, intake   NEXT VISIT: June 17 phone f/u  Kerby Hockley B. Zenia Resides, [place], [place] Registered Dietitian [phone] (pager)

## 2019-05-16 NOTE — Assessment & Plan Note (Signed)
History of.  I checked back on the cardiology notes and it was stated to take Eliquis along with Plavix.  We will update the patient.

## 2019-05-17 ENCOUNTER — Inpatient Hospital Stay (HOSPITAL_BASED_OUTPATIENT_CLINIC_OR_DEPARTMENT_OTHER): Payer: Medicare Other | Admitting: Hospice and Palliative Medicine

## 2019-05-17 ENCOUNTER — Other Ambulatory Visit: Payer: Self-pay

## 2019-05-17 DIAGNOSIS — Z515 Encounter for palliative care: Secondary | ICD-10-CM

## 2019-05-17 DIAGNOSIS — C3490 Malignant neoplasm of unspecified part of unspecified bronchus or lung: Secondary | ICD-10-CM | POA: Diagnosis not present

## 2019-05-17 NOTE — Telephone Encounter (Signed)
Noted. Thanks.

## 2019-05-17 NOTE — Progress Notes (Signed)
Virtual Visit via Telephone Note  I connected with Craig Koch on 05/17/19 at  64:00 PM EDT by telephone and verified that I am speaking with the correct person using two identifiers.   I discussed the limitations, risks, security and privacy concerns of performing an evaluation and management service by telephone and the availability of in person appointments. I also discussed with the patient that there may be a patient responsible charge related to this service. The patient expressed understanding and agreed to proceed.   History of Present Illness: Craig Jolliff Gerringeris a 64 y.o.malewith multiple medical problems including stage IV adenocarcinoma of the lung with history of left sided malignant pleural effusion requiring Pleurx(later removed). Patient has opted not to pursue chemotherapy and has instead been treated on immunotherapy. Patient has had persistent shortness of breath. CTA of the chest on 11/02/2018 revealed marked increase in tumor encasing the entire left lung with new diffuse increased interstitial thickening concerning for disease progression and lymphangitic spread of disease.Repeat CT of the chest and abdomen on 01/22/2019 revealed significant interval decreaseoftumor burden on the lung. Patient was admitted to the hospital on 03/03/2019 with shortness of breath and weakness. He was found to have non-STEMI and DKA. He was treated with oral antibiotics for community-acquired pneumonia.  Patient was referred to palliative care to help address goals and manage ongoing symptoms.   Observations/Objective: Patient was unable to participate in virtual visit so I called him instead.  Patient says that his shortness of breath and respiratory symptoms were improving with antibiotics but then worsened after they were discontinued.  PCP started dual antibiotic treatment with Augmentin and doxycycline yesterday after chest x-ray revealed persistent pneumonia.  Patient reports he  has occasional productive cough and persistent shortness of breath, which is worse with exertion.  However, he denies fever or chills.  He says that his appetite has significantly improved.  He denies any other distressing symptoms.  Hemodialysis has been discontinued.  Patient reports good urine output.  He denies any swelling.  He has follow-up next week with nephrology.  Assessment and Plan: Stage IV adenocarcinoma of the lung -on pembrolizumab.  Has follow-up scheduled next week with Dr. Tasia Catchings  Community-acquired pneumonia -continue oral antibiotics  Follow Up Instructions: Follow-up virtual visit about a month   I discussed the assessment and treatment plan with the patient. The patient was provided an opportunity to ask questions and all were answered. The patient agreed with the plan and demonstrated an understanding of the instructions.   The patient was advised to call back or seek an in-person evaluation if the symptoms worsen or if the condition fails to improve as anticipated.  I provided 7 minutes of non-face-to-face time during this encounter.   Irean Hong, NP

## 2019-05-20 ENCOUNTER — Other Ambulatory Visit: Payer: Self-pay

## 2019-05-20 ENCOUNTER — Inpatient Hospital Stay: Payer: Medicare Other

## 2019-05-20 ENCOUNTER — Inpatient Hospital Stay (HOSPITAL_BASED_OUTPATIENT_CLINIC_OR_DEPARTMENT_OTHER): Payer: Medicare Other | Admitting: Oncology

## 2019-05-20 ENCOUNTER — Other Ambulatory Visit: Payer: Self-pay | Admitting: Oncology

## 2019-05-20 ENCOUNTER — Encounter: Payer: Self-pay | Admitting: Oncology

## 2019-05-20 VITALS — BP 98/59 | HR 67 | Temp 95.2°F | Resp 18 | Wt 179.3 lb

## 2019-05-20 DIAGNOSIS — D631 Anemia in chronic kidney disease: Secondary | ICD-10-CM | POA: Diagnosis not present

## 2019-05-20 DIAGNOSIS — C3490 Malignant neoplasm of unspecified part of unspecified bronchus or lung: Secondary | ICD-10-CM

## 2019-05-20 DIAGNOSIS — J440 Chronic obstructive pulmonary disease with acute lower respiratory infection: Secondary | ICD-10-CM | POA: Diagnosis not present

## 2019-05-20 DIAGNOSIS — Z5112 Encounter for antineoplastic immunotherapy: Secondary | ICD-10-CM

## 2019-05-20 DIAGNOSIS — J189 Pneumonia, unspecified organism: Secondary | ICD-10-CM | POA: Diagnosis not present

## 2019-05-20 DIAGNOSIS — N2581 Secondary hyperparathyroidism of renal origin: Secondary | ICD-10-CM | POA: Diagnosis not present

## 2019-05-20 DIAGNOSIS — E1122 Type 2 diabetes mellitus with diabetic chronic kidney disease: Secondary | ICD-10-CM | POA: Diagnosis not present

## 2019-05-20 DIAGNOSIS — J910 Malignant pleural effusion: Secondary | ICD-10-CM | POA: Diagnosis not present

## 2019-05-20 DIAGNOSIS — N184 Chronic kidney disease, stage 4 (severe): Secondary | ICD-10-CM | POA: Diagnosis not present

## 2019-05-20 DIAGNOSIS — N179 Acute kidney failure, unspecified: Secondary | ICD-10-CM | POA: Diagnosis not present

## 2019-05-20 DIAGNOSIS — N186 End stage renal disease: Secondary | ICD-10-CM | POA: Diagnosis not present

## 2019-05-20 DIAGNOSIS — Z87891 Personal history of nicotine dependence: Secondary | ICD-10-CM | POA: Diagnosis not present

## 2019-05-20 DIAGNOSIS — E1136 Type 2 diabetes mellitus with diabetic cataract: Secondary | ICD-10-CM | POA: Diagnosis not present

## 2019-05-20 LAB — CBC WITH DIFFERENTIAL/PLATELET
Abs Immature Granulocytes: 0.03 10*3/uL (ref 0.00–0.07)
Basophils Absolute: 0.1 10*3/uL (ref 0.0–0.1)
Basophils Relative: 2 %
Eosinophils Absolute: 0.4 10*3/uL (ref 0.0–0.5)
Eosinophils Relative: 6 %
HCT: 29.8 % — ABNORMAL LOW (ref 39.0–52.0)
Hemoglobin: 9.7 g/dL — ABNORMAL LOW (ref 13.0–17.0)
Immature Granulocytes: 1 %
Lymphocytes Relative: 13 %
Lymphs Abs: 0.7 10*3/uL (ref 0.7–4.0)
MCH: 29.8 pg (ref 26.0–34.0)
MCHC: 32.6 g/dL (ref 30.0–36.0)
MCV: 91.7 fL (ref 80.0–100.0)
Monocytes Absolute: 0.7 10*3/uL (ref 0.1–1.0)
Monocytes Relative: 12 %
Neutro Abs: 3.8 10*3/uL (ref 1.7–7.7)
Neutrophils Relative %: 66 %
Platelets: 340 10*3/uL (ref 150–400)
RBC: 3.25 MIL/uL — ABNORMAL LOW (ref 4.22–5.81)
RDW: 16.6 % — ABNORMAL HIGH (ref 11.5–15.5)
WBC: 5.7 10*3/uL (ref 4.0–10.5)
nRBC: 0 % (ref 0.0–0.2)

## 2019-05-20 LAB — COMPREHENSIVE METABOLIC PANEL
ALT: 28 U/L (ref 0–44)
AST: 25 U/L (ref 15–41)
Albumin: 2.7 g/dL — ABNORMAL LOW (ref 3.5–5.0)
Alkaline Phosphatase: 229 U/L — ABNORMAL HIGH (ref 38–126)
Anion gap: 10 (ref 5–15)
BUN: 42 mg/dL — ABNORMAL HIGH (ref 8–23)
CO2: 24 mmol/L (ref 22–32)
Calcium: 8.8 mg/dL — ABNORMAL LOW (ref 8.9–10.3)
Chloride: 102 mmol/L (ref 98–111)
Creatinine, Ser: 2.28 mg/dL — ABNORMAL HIGH (ref 0.61–1.24)
GFR calc Af Amer: 34 mL/min — ABNORMAL LOW (ref >60)
GFR calc non Af Amer: 29 mL/min — ABNORMAL LOW (ref >60)
Glucose, Bld: 233 mg/dL — ABNORMAL HIGH (ref 70–99)
Potassium: 4.7 mmol/L (ref 3.5–5.1)
Sodium: 136 mmol/L (ref 135–145)
Total Bilirubin: 0.7 mg/dL (ref 0.3–1.2)
Total Protein: 7.3 g/dL (ref 6.5–8.1)

## 2019-05-20 LAB — TSH: TSH: 6.888 u[IU]/mL — ABNORMAL HIGH (ref 0.350–4.500)

## 2019-05-20 LAB — T4, FREE: Free T4: 1.27 ng/dL — ABNORMAL HIGH (ref 0.61–1.12)

## 2019-05-20 NOTE — Progress Notes (Signed)
Hematology/Oncologyfollow up  note [facility] [phone] [phone]   Patient Care Team: Tonia Ghent, MD as PCP - General (Family Medicine) Thelma Comp, Garner (Optometry) Telford Nab, RN as Registered Nurse  REFERRING PROVIDER: Tonia Ghent, MD  CHIEF COMPLAINTS/REASON FOR VISIT:  Follow-up for metastatic lung adenocarcinoma  HISTORY OF PRESENTING ILLNESS:   Craig Koch is a  64 y.o.  male with PMH listed below was seen in consultation at the request of  Tonia Ghent, MD  for evaluation of malignant pleural effusion. Patient was recently admitted to [facility] due to large amount of pleural effusion, acute hypoxic respiratory failure. Patient reports feeling shortness of breath and dyspnea on exertion for several weeks and presented to emergency room.  A chest x-ray 08/23/2018 showed a large amount of left pleural effusion 08/24/2018 CT chest abdomen pelvis confirmed large left pleural effusion with near complete collapse of the left lower lobe and extensive volume loss in the lingula and a portion of the left upper lobe. No acute intra-abdominal process.  Cholelithiasis, diverticulitis, coronary atherosclerosis post CABG.  Bilateral renal cortical scarring.  Aortic atherosclerosis.  #08/24/2018 ultrasound guided diagnostic and therapeutic thoracentesis removed 1.8 L of pleural fluid. Patient subsequently felt better and was discharged to follow-up with pulmonology. 09/04/2018 patient followed up with Dr.Aleskerov chest x-ray showed recurrent moderate left pleural effusion.  Patient underwent ultrasound-guided left thoracentesis again and it drained 3 L of fluid. Pleural fluid cytology positive for adenocarcinoma. Patient was referred to me for further evaluation and management.  # PET eft sided pleural hypermetabolic activity  consistent with malignant pleural effusion. Left upper and lower lobe low-level hypermetabolic him  corresponding to areas of airspace and groundglass opacity.  This was felt to be secondary to atelectasis.  Underlying left upper lobe primary bronchogenic carcinoma cannot be excluded. Medial left upper lobe area more focal hypermetabolic area could represent a pleural disease or a site of small left upper lobe primary. No evidence of extra thoracic hypermetabolic metastatic disease.  Left sided hydro-pneumothorax within the pleural air component being new since the prior CT. Cholelithiasis.  #MRI brain showed punctate focus of enhancement within the right parietal lobe cortex suspicious for possible metastasis. Molecular study showed BRAF V600 mutation. 11/16/2018 started on dabrafenib 150 mg twice daily and trametinib 2 mg daily.  He had good response  #Patient was admitted from 03/03/2019-03/19/2019 due to DKA due to uncontrolled diabetes, acute decompensation of CHF left lower lobe pneumonia right pleural effusion status post thoracentesis.  Fluid study was consistent with transudate. AKI secondary to ATN, on hemodialysis Tuesday Thursday and Saturday, atrial fibrillation with RVR, paroxysmal.  Patient was discharged on Eliquis and amiodarone. Patient was readmitted from 03/26/2019-04/01/2019 due to CHF exacerbation, right pleural effusion recurrent.  Status post diagnostic and therapeutic thoracentesis.  Cytology was negative for malignant cells.   INTERVAL HISTORY Craig Koch is a 64 y.o. male who has above history reviewed by me today presents for follow-up of lung cancer Problems and complaints are listed below:  Patient was accompanied by his wife today.  During the interval, patient was seen by cardiology, and primary care provider. Repeat checks x-ray 5/11/2021showed patchy peripheral airspace opacities throughout the right lung similar to prior study.  Increasing airspace disease throughout the left lung concerning for pneumonia. Patient was given another course of Augmentin and  doxycycline for treatments. Cardiology added Eliquis and patient is also on Plavix. Clinically today he feels shortness of breath is at  baseline.  Mostly with exertion.  Occasional cough, no sputum.  No fever or chills.  Review of Systems  Constitutional: Positive for fatigue. Negative for appetite change, chills, fever and unexpected weight change.  HENT:   Negative for hearing loss and voice change.   Eyes: Negative for eye problems and icterus.  Respiratory: Positive for shortness of breath. Negative for chest tightness and cough.   Cardiovascular: Negative for chest pain and leg swelling.  Gastrointestinal: Negative for abdominal distention, abdominal pain and nausea.  Endocrine: Negative for hot flashes.  Genitourinary: Negative for difficulty urinating, dysuria and frequency.   Musculoskeletal: Negative for arthralgias.  Skin: Negative for itching and rash.  Neurological: Negative for light-headedness and numbness.  Hematological: Negative for adenopathy. Bruises/bleeds easily.  Psychiatric/Behavioral: Negative for confusion.    MEDICAL HISTORY:  Past Medical History:  Diagnosis Date  . Anxiety   . Arthritis   . CHF (congestive heart failure) ([place])   . Coronary artery disease   . Diabetes mellitus   . Dyslipidemia   . Hx of CABG   . Hyperlipidemia   . Hypertension   . Malignant neoplasm of unspecified part of unspecified bronchus or lung ([place]) 08/2018   Immunotherapy  . Pleural effusion     SURGICAL HISTORY: Past Surgical History:  Procedure Laterality Date  . CATARACT EXTRACTION Left 09/2015  . CHEST TUBE INSERTION Left 10/01/2018   Procedure: INSERTION PLEURAL DRAINAGE CATHETER;  Surgeon: [name], MD;  Location: [facility] ORS;  Service: Thoracic;  Laterality: Left;  . CORONARY ARTERY BYPASS GRAFT  2004   (CABG with LIMA to the  LAD, SVG to OM2/OM3, SVG  to diag  . DIALYSIS/PERMA CATHETER INSERTION N/A 03/14/2019   Procedure: DIALYSIS/PERMA CATHETER INSERTION;   Surgeon: Algernon Huxley, MD;  Location: [facility];  Service: Cardiovascular;  Laterality: N/A;  . Left ankle surgery     repair of fracture  . Right lower leg surgery     rod  . TEMPORARY DIALYSIS CATHETER N/A 03/11/2019   Procedure: TEMPORARY DIALYSIS CATHETER;  Surgeon: Algernon Huxley, MD;  Location: [facility];  Service: Cardiovascular;  Laterality: N/A;   SOCIAL HISTORY: Social History   Socioeconomic History  . Marital status: Married    Spouse name: Not on file  . Number of children: Not on file  . Years of education: Not on file  . Highest education level: Not on file  Occupational History  . Not on file  Tobacco Use  . Smoking status: Former Smoker    Packs/day: 0.20    Years: 45.00    Pack years: 9.00    Types: Cigarettes    Quit date: 03/05/2019    Years since quitting: 0.2  . Smokeless tobacco: Former Systems developer    Types: Snuff  Substance and Sexual Activity  . Alcohol use: Not Currently    Alcohol/week: 6.0 standard drinks    Types: 6 Cans of beer per week    Comment: occ, average 6 pack in a week  . Drug use: No  . Sexual activity: Not Currently  Other Topics Concern  . Not on file  Social History Narrative   On disability 2009 after prev injuries and CAD.     Married 1976   2 kids, 4 grandkids.    Social Determinants of Health   Financial Resource Strain:   . Difficulty of Paying Living Expenses:   Food Insecurity:   . Worried About Charity fundraiser in the Last  Year:   . Ran Out of Food in the Last Year:   Transportation Needs:   . Film/video editor (Medical):   [name] Lack of Transportation (Non-Medical):   Physical Activity:   . Days of Exercise per Week:   . Minutes of Exercise per Session:   Stress:   . Feeling of Stress :   Social Connections:   . Frequency of Communication with Friends and Family:   . Frequency of Social Gatherings with Friends and Family:   . Attends Religious Services:   . Active Member of Clubs or  Organizations:   . Attends Archivist Meetings:   [name] Marital Status:   Intimate Partner Violence:   . Fear of Current or Ex-Partner:   . Emotionally Abused:   [name] Physically Abused:   . Sexually Abused:      FAMILY HISTORY: Family History  Problem Relation Age of Onset  . Dementia Mother   . Heart disease Father   . Colon cancer Neg Hx   . Prostate cancer Neg Hx   . Diabetes Neg Hx    ALLERGIES:  Allergies  Allergen Reactions  . Lipitor [Atorvastatin Calcium] Other (See Comments)    Aches.  Tolerated crestor.      MEDICATIONS: PHYSICAL EXAMINATION: Current Outpatient Medications on File Prior to Visit  Medication Sig Dispense Refill  . acetaminophen (TYLENOL) 325 MG tablet Take 2 tablets (650 mg total) by mouth every 6 (six) hours as needed for mild pain or fever.    [name] albuterol (VENTOLIN HFA) 108 (90 Base) MCG/ACT inhaler Inhale 2 puffs into the lungs every 6 (six) hours as needed for wheezing or shortness of breath. 18 g 0  . amiodarone (PACERONE) 200 MG tablet Take 1 tablet (200 mg total) by mouth daily. 90 tablet 0  . amoxicillin-clavulanate (AUGMENTIN) 875-125 MG tablet Take 1 tablet by mouth 2 (two) times daily. 20 tablet 0  . apixaban (ELIQUIS) 5 MG TABS tablet Take 1 tablet (5 mg total) by mouth 2 (two) times daily. 60 tablet 5  . clopidogrel (PLAVIX) 75 MG tablet Take 1 tablet (75 mg total) by mouth daily. 90 tablet 0  . dextromethorphan-guaiFENesin (MUCINEX DM) 30-600 MG 12hr tablet Take 1 tablet by mouth 2 (two) times daily.    [name] doxycycline (VIBRA-TABS) 100 MG tablet Take 1 tablet (100 mg total) by mouth 2 (two) times daily. 20 tablet 0  . DULoxetine (CYMBALTA) 30 MG capsule Take 1 capsule (30 mg total) by mouth daily. 30 capsule 0  . ezetimibe (ZETIA) 10 MG tablet Take 1 tablet (10 mg total) by mouth daily. 30 tablet 5  . ferrous sulfate 325 (65 FE) MG tablet Take 1 tablet (325 mg total) by mouth 2 (two) times daily with a meal. 914 tablet 0  . folic acid  (FOLVITE) 1 MG tablet Take 1 tablet (1 mg total) by mouth daily.    . insulin aspart (NOVOLOG) 100 UNIT/ML injection Inject 0-8 Units into the skin as directed. Take 0 units if CBG 70-150 Take 1 units if CBG 151-200 Take 2 units if CBG 201-250 Take 3 units if CBG 251-300 Take 4 units if CBG 301-350 Take 6 units if CBG 351-400 If CBG > 400, give 8 units and call MD 10 mL   . insulin detemir (LEVEMIR) 100 UNIT/ML injection Inject 0.15-0.18 mLs (15-18 Units total) into the skin at bedtime. 10 mL   . metoprolol succinate (TOPROL-XL) 25 MG 24 hr tablet Take 0.5 tablets (12.5 mg  total) by mouth daily. 45 tablet 1  . multivitamin (RENA-VIT) TABS tablet Take 1 tablet by mouth at bedtime.  0  . promethazine (PHENERGAN) 25 MG tablet Take 1 tablet (25 mg total) by mouth every 6 (six) hours as needed for nausea or vomiting. 60 tablet 0  . rosuvastatin (CRESTOR) 10 MG tablet TAKE 1 TABLET (10 MG TOTAL) BY MOUTH DAILY. 90 tablet 3  . Ensure Max Protein (ENSURE MAX PROTEIN) LIQD Take 330 mLs (11 oz total) by mouth 2 (two) times daily between meals. (Patient not taking: Reported on 05/20/2019)    . polyethylene glycol (MIRALAX / GLYCOLAX) 17 g packet Take 17 g by mouth daily. (Patient not taking: Reported on 05/20/2019) 14 each 0  . simethicone (MYLICON) 80 MG chewable tablet Chew 1 tablet (80 mg total) by mouth 4 (four) times daily as needed for flatulence. (Patient not taking: Reported on 05/20/2019) 30 tablet 0  . thiamine 100 MG tablet Take 1 tablet (100 mg total) by mouth daily. (Patient not taking: Reported on 05/20/2019)     No current facility-administered medications on file prior to visit.    ECOG PERFORMANCE STATUS:2  Today's Vitals   05/20/19 0846  BP: (!) 98/59  Pulse: 67  Resp: 18  Temp: (!) 95.2 F (35.1 C)  SpO2: 100%  Weight: 179 lb 4.8 oz (81.3 kg)  PainSc: 0-No pain   Body mass index is 24.32 kg/m.  Physical Exam Constitutional:      General: He is not in acute distress.     Appearance: He is ill-appearing.     Comments: Patient sits in the wheelchair.  HENT:     Head: Normocephalic and atraumatic.  Eyes:     General: No scleral icterus.    Pupils: Pupils are equal, round, and reactive to light.  Cardiovascular:     Rate and Rhythm: Normal rate and regular rhythm.     Heart sounds: Normal heart sounds.  Pulmonary:     Effort: Pulmonary effort is normal. No respiratory distress.     Breath sounds: No wheezing.     Comments: Good air entry the right lower lung base. Decreased breath sound left lower lung. Abdominal:     General: Bowel sounds are normal. There is no distension.     Palpations: Abdomen is soft. There is no mass.     Tenderness: There is no abdominal tenderness.  Musculoskeletal:        General: No deformity. Normal range of motion.     Cervical back: Normal range of motion and neck supple.  Skin:    General: Skin is warm and dry.     Findings: No erythema or rash.  Neurological:     Mental Status: He is alert and oriented to person, place, and time. Mental status is at baseline.     Cranial Nerves: No cranial nerve deficit.     Coordination: Coordination normal.  Psychiatric:        Mood and Affect: Mood normal.     LABORATORY DATA:  I have reviewed the data as listed Lab Results  Component Value Date   WBC 18.9 (H) 11/09/2018   HGB 11.0 (L) 11/09/2018   HCT 33.5 (L) 11/09/2018   MCV 89.8 11/09/2018   PLT 581 (H) 11/09/2018   Recent Labs    10/19/18 0841 11/02/18 1238 11/09/18 0833  NA 137 132* 131*  K 4.0 4.0 3.5  CL 104 95* 94*  CO2 _0 GLUCOSE 252* 425*  361*  BUN _0 CREATININE 0.61 0.84 0.77  CALCIUM 8.5* 8.5* 8.0*  GFRNONAA >60 >60 >60  GFRAA >60 >60 >60  PROT 6.0* 7.0 6.5  ALBUMIN 3.0* 2.6* 2.3*  AST 14* 11* 12*  ALT _1 ALKPHOS 69 75 63  BILITOT 0.5 0.9 0.8   Iron/TIBC/Ferritin/ %Sat No results found for: IRON, TIBC, FERRITIN, IRONPCTSAT    RADIOGRAPHIC STUDIES: I have personally  reviewed the radiological images as listed and agreed with the findings in the report. DG Chest 1 View  Result Date: 03/14/2019 CLINICAL DATA:  Wheezing EXAM: CHEST  1 VIEW COMPARISON:  03/03/2019 FINDINGS: Borderline heart size.  CABG. Diffuse interstitial opacity with Kerley lines and left more than right pleural effusion. No pneumothorax. IMPRESSION: CHF. Electronically Signed   By: Monte Fantasia M.D.   On: 03/14/2019 05:47   DG Chest 2 View  Result Date: 05/14/2019 CLINICAL DATA:  Shortness of breath, lung cancer EXAM: CHEST - 2 VIEW COMPARISON:  05/01/2019 FINDINGS: Right dialysis catheter remains in place, unchanged. Prior CABG. Heart is normal size. Patchy opacities peripherally in the right lung and throughout the left lung. Left lung opacities have increased since prior study. This is concerning for pneumonia superimposed on areas of scarring. Small left pleural effusions suspected. No right effusion. IMPRESSION: Patchy peripheral airspace opacities throughout the right lung are similar to prior study. Increasing airspace disease throughout the left lung concerning for pneumonia. Small left effusion. Electronically Signed   By: Rolm Baptise M.D.   On: 05/14/2019 14:10   DG Chest 2 View  Result Date: 05/01/2019 CLINICAL DATA:  Shortness of breath EXAM: CHEST - 2 VIEW COMPARISON:  March 29, 2019 FINDINGS: There is a persistent small left pleural effusion. There is scarring and atelectasis throughout portions of the left mid and lower lung regions. There is new ill-defined airspace opacity in the right lower lobe. Heart is upper normal in size with pulmonary vascularity normal. Patient is status post coronary artery bypass grafting. Central catheter tip is in the superior vena cava. No pneumothorax. No bone lesions. IMPRESSION: New area of somewhat ill-defined opacity in the lateral right base, concerning for early pneumonia. Persistent fairly small left pleural effusion with atelectatic change in  scarring left mid and lower lung zones. No new opacity on the left. Stable cardiac silhouette. Postoperative changes. Central catheter tip in superior vena cava without pneumothorax. Electronically Signed   By: Lowella Grip III M.D.   On: 05/01/2019 16:04   DG Chest 2 View  Result Date: 03/28/2019 CLINICAL DATA:  End-stage renal disease EXAM: CHEST - 2 VIEW COMPARISON:  03/26/2019 FINDINGS: Cardiac shadow is mildly enlarged but stable. Postsurgical changes and dialysis catheter are again seen. Bilateral pleural effusions are seen stable in appearance from the prior exam. Mild vascular congestion is again noted but slightly improved. No focal confluent infiltrate is seen. IMPRESSION: Slight improvement in the degree of vascular congestion. Bilateral pleural effusions stable from the prior exam. Electronically Signed   By: Inez Catalina M.D.   On: 03/28/2019 14:22   DG Chest 2 View  Result Date: 03/26/2019 CLINICAL DATA:  Shortness of breath EXAM: CHEST - 2 VIEW COMPARISON:  March 15, 2019 FINDINGS: Heart is mildly enlarged with pulmonary venous hypertension. There is a pleural effusion on the left. There is consolidation in the medial left base. There is mild interstitial edema present with atelectatic change in the left mid and lower lung zones. Patient is status post coronary  artery bypass grafting. Central catheter tip is in the superior vena cava. No pneumothorax. No bone lesions. IMPRESSION: Cardiomegaly with pulmonary vascular congestion. Left pleural effusion. There is a degree of interstitial edema. Suspect a degree of congestive heart failure. Consolidation in the medial left base may represent alveolar edema or pneumonia. Both entities may be present concurrently. Central catheter tip is in the superior vena cava. Patient is status post coronary artery bypass grafting. Electronically Signed   By: Lowella Grip III M.D.   On: 03/26/2019 16:56   CT HEAD WO CONTRAST  Result Date:  03/05/2019 CLINICAL DATA:  Metastatic disease evaluation.  Lung cancer. EXAM: CT HEAD WITHOUT CONTRAST TECHNIQUE: Contiguous axial images were obtained from the base of the skull through the vertex without intravenous contrast. COMPARISON:  MRI head 09/20/2018 FINDINGS: Brain: Mild atrophy. Mild hypodensity in the white matter unchanged from the prior MRI. No acute infarct, hemorrhage, mass. No edema or midline shift. Vascular: Negative for hyperdense vessel Skull: Negative skull. Sinuses/Orbits: Mucosal edema and bony thickening right maxillary sinus. Chronic nasal bone fracture. Left cataract extraction. Other: None IMPRESSION: No acute abnormality and negative for metastatic disease on unenhanced CT. Atrophy and mild chronic microvascular ischemia. Electronically Signed   By: Franchot Gallo M.D.   On: 03/05/2019 15:28   CT CHEST WO CONTRAST  Result Date: 03/14/2019 CLINICAL DATA:  Respiratory failure. EXAM: CT CHEST WITHOUT CONTRAST TECHNIQUE: Multidetector CT imaging of the chest was performed following the standard protocol without IV contrast. COMPARISON:  Chest CT dated 01/22/2019. FINDINGS: Cardiovascular: Heart size appears stable. No pericardial effusion. Diffuse coronary artery calcifications. Surgical changes of median sternotomy for presumed CABG. Mediastinum/Nodes: No mass or enlarged lymph nodes seen within the mediastinum. Esophagus appears normal. Trachea is unremarkable. Lungs/Pleura: New RIGHT pleural effusion, moderate to large in size. Associated compressive atelectasis within the RIGHT perihilar and lower lobe. New ill-defined consolidation within the LEFT lower lobe, suspected pneumonia. Probable additional rounded atelectasis within the lingula. Diffuse interstitial thickening indicating some degree of volume overload versus atypical infection. Upper Abdomen: Limited images of the upper abdomen are unremarkable. Musculoskeletal: No acute or suspicious osseous finding. IMPRESSION: 1. New  ill-defined consolidation within the LEFT lower lobe, suspected pneumonia. 2. New moderate to large-sized RIGHT pleural effusion, with associated compressive atelectasis. 3. Diffuse interstitial thickening, most likely edema/fluid overload, less likely additional atypical infection. 4. Probable additional rounded atelectasis within the lingula. Aortic Atherosclerosis (ICD10-I70.0). Electronically Signed   By: Franki Cabot M.D.   On: 03/14/2019 19:47   US RENAL  Result Date: 03/10/2019 CLINICAL DATA:  63 year old presenting with acute renal failure. Evaluate for obstruction as a possible cause. EXAM: RENAL / URINARY TRACT ULTRASOUND COMPLETE COMPARISON:  Urinary tract ultrasound 03/04/2019. CT abdomen and pelvis 01/22/2019. FINDINGS: Right Kidney: Renal measurements: Approximately 12.6 x 6.4 x 6.2 cm = volume: 264 mL. Mildly echogenic parenchyma. No hydronephrosis. Well-preserved cortex. No shadowing calculi. No focal parenchymal abnormality. Left Kidney: Renal measurements: Approximately 12.5 x 6.3 x 6.9 cm = volume: 283 mL. Mildly echogenic parenchyma. No hydronephrosis. Well-preserved cortex. No shadowing calculi. No focal parenchymal abnormality. Bladder: Normal for degree of bladder distention. Other: None. IMPRESSION: 1. Mildly echogenic renal parenchyma indicative of medical renal disease. 2. Otherwise normal examination. Specifically, no evidence of urinary tract obstruction to account for the acute renal insufficiency. Electronically Signed   By: Evangeline Dakin M.D.   On: 03/10/2019 15:02   US RENAL  Result Date: 03/04/2019 CLINICAL DATA:  Acute kidney injury EXAM: RENAL /  URINARY TRACT ULTRASOUND COMPLETE COMPARISON:  CT 01/22/2019 FINDINGS: Right Kidney: Renal measurements: 11.4 x 6 x 6 cm = volume: 215 mL . Echogenicity within normal limits. No mass or hydronephrosis visualized. Left Kidney: Renal measurements: 11.8 x 7.3 x 6.5 cm = volume: 291.8 mL. Echogenicity within normal limits. No mass or  hydronephrosis visualized. Bladder: Appears normal for degree of bladder distention. Other: Incidental note made of gallstones and small effusions. Heterogenous prostate. IMPRESSION: Normal ultrasound appearance of the kidneys Electronically Signed   By: Donavan Foil M.D.   On: 03/04/2019 15:37   PERIPHERAL VASCULAR CATHETERIZATION  Result Date: 03/14/2019 See op note  PERIPHERAL VASCULAR CATHETERIZATION  Result Date: 03/11/2019 See op note  DG Chest Port 1 View  Result Date: 03/29/2019 CLINICAL DATA:  Post thoracentesis. EXAM: PORTABLE CHEST 1 VIEW COMPARISON:  03/28/2019. FINDINGS: Post thoracentesis chest x-ray reveals no evidence of pneumothorax. Significant reduction in right-sided pleural effusion. Small left pleural effusion again noted. Bibasilar atelectasis/infiltrates again noted. Prior CABG. Right IJ line stable position. IMPRESSION: Post thoracentesis chest x-ray reveals no evidence of pneumothorax. Significant reduction in right-sided pleural effusion. Electronically Signed   By: Marcello Moores  Register   On: 03/29/2019 15:01   DG Chest Port 1 View  Result Date: 03/15/2019 CLINICAL DATA:  Status post thoracentesis. EXAM: PORTABLE CHEST 1 VIEW COMPARISON:  03/14/2019 FINDINGS: Interval decrease right pleural effusion. No evidence for pneumothorax. Bibasilar atelectasis/infiltrate again noted with persistent small left pleural effusion. The cardiopericardial silhouette is within normal limits for size. Pulmonary vascular congestion has decreased in the interval. Right IJ central line tip overlies the mid to distal SVC. The visualized bony structures of the thorax are intact. Telemetry leads overlie the chest. IMPRESSION: Interval decrease in right pleural effusion without evidence of pneumothorax. Decrease in pulmonary vascular congestion with persistent bibasilar atelectasis/infiltrate and small left pleural effusion. Electronically Signed   By: Misty Stanley M.D.   On: 03/15/2019 11:36   DG  Chest Portable 1 View  Result Date: 03/03/2019 CLINICAL DATA:  Shortness of breath, since Monday worsening this morning around 1 a.m. EXAM: PORTABLE CHEST 1 VIEW COMPARISON:  12/19/2018 FINDINGS: Cardiomediastinal contours are stable following median sternotomy with persistent left-sided pleural effusion and pleural-parenchymal scarring. No new areas of consolidation or evidence of right-sided pleural effusion. No signs of acute bone process. IMPRESSION: Persistent left-sided pleural effusion and pleural-parenchymal scarring. Electronically Signed   By: Zetta Bills M.D.   On: 03/03/2019 13:59   ECHOCARDIOGRAM COMPLETE  Result Date: 03/15/2019    ECHOCARDIOGRAM REPORT   Patient Name:   Craig Koch Date of Exam: 03/15/2019 Medical Rec #:  [id number]       Height:       72.0 in Accession #:    [id number]      Weight:       201.4 lb Date of Birth:  1955-12-04       BSA:          2.137 m Patient Age:    29 years        BP:           125/69 mmHg Patient Gender: M               HR:           83 bpm. Exam Location:  [facility] Procedure: 2D Echo, Cardiac Doppler and Color Doppler Indications:     Respiratory distress  History:         Patient has prior history of  Echocardiogram examinations, most                  recent 03/04/2019. Prior CABG; Risk Factors:Hypertension and                  Diabetes.  Sonographer:     Sherrie Sport RDCS (AE) Referring Phys:  [id number] Claiborne Billings A GRIFFITH Diagnosing Phys: Kathlyn Sacramento MD IMPRESSIONS  1. Left ventricular ejection fraction, by estimation, is 35 to 40%. The left ventricle has moderately decreased function. The left ventricle demonstrates regional wall motion abnormalities . The left ventricular internal cavity size was mildly dilated. There is mild left ventricular hypertrophy. Left ventricular diastolic parameters are consistent with Grade II diastolic dysfunction (pseudonormalization). There is moderate hypokinesis of the left ventricular, entire inferior wall.  2. Right  ventricular systolic function is normal. The right ventricular size is normal. There is moderately elevated pulmonary artery systolic pressure. The estimated right ventricular systolic pressure is 34.7 mmHg.  3. Left atrial size was mildly dilated.  4. The mitral valve is normal in structure. Moderate mitral valve regurgitation. No evidence of mitral stenosis.  5. The aortic valve is abnormal. Aortic valve regurgitation is mild. Mild to moderate aortic valve sclerosis/calcification is present, without any evidence of aortic stenosis.  6. The inferior vena cava is normal in size with greater than 50% respiratory variability, suggesting right atrial pressure of 3 mmHg. FINDINGS  Left Ventricle: Left ventricular ejection fraction, by estimation, is 35 to 40%. The left ventricle has moderately decreased function. The left ventricle demonstrates regional wall motion abnormalities. Moderate hypokinesis of the left ventricular, entire inferior wall. The left ventricular internal cavity size was mildly dilated. There is mild left ventricular hypertrophy. Left ventricular diastolic parameters are consistent with Grade II diastolic dysfunction (pseudonormalization). Right Ventricle: The right ventricular size is normal. No increase in right ventricular wall thickness. Right ventricular systolic function is normal. There is moderately elevated pulmonary artery systolic pressure. The tricuspid regurgitant velocity is 3.37 m/s, and with an assumed right atrial pressure of 3 mmHg, the estimated right ventricular systolic pressure is 42.5 mmHg. Left Atrium: Left atrial size was mildly dilated. Right Atrium: Right atrial size was normal in size. Pericardium: There is no evidence of pericardial effusion. Mitral Valve: The mitral valve is normal in structure. Normal mobility of the mitral valve leaflets. Moderate mitral valve regurgitation. No evidence of mitral valve stenosis. Tricuspid Valve: The tricuspid valve is normal in  structure. Tricuspid valve regurgitation is mild . No evidence of tricuspid stenosis. Aortic Valve: The aortic valve is abnormal. Aortic valve regurgitation is mild. Mild to moderate aortic valve sclerosis/calcification is present, without any evidence of aortic stenosis. Aortic valve mean gradient measures 4.5 mmHg. Aortic valve peak gradient measures 8.4 mmHg. Aortic valve area, by VTI measures 2.54 cm. Pulmonic Valve: The pulmonic valve was normal in structure. Pulmonic valve regurgitation is not visualized. No evidence of pulmonic stenosis. Aorta: The aortic root is normal in size and structure. Venous: The inferior vena cava is normal in size with greater than 50% respiratory variability, suggesting right atrial pressure of 3 mmHg. IAS/Shunts: No atrial level shunt detected by color flow Doppler.  LEFT VENTRICLE PLAX 2D LVIDd:         5.34 cm      Diastology LVIDs:         4.38 cm      LV e' lateral:   5.77 cm/s LV PW:         1.00 cm  LV E/e' lateral: 22.4 LV IVS:        1.20 cm      LV e' medial:    5.00 cm/s LVOT diam:     2.00 cm      LV E/e' medial:  25.8 LV SV:         63 LV SV Index:   30 LVOT Area:     3.14 cm  LV Volumes (MOD) LV vol d, MOD A2C: 227.0 ml LV vol d, MOD A4C: 148.0 ml LV vol s, MOD A2C: 142.0 ml LV vol s, MOD A4C: 110.0 ml LV SV MOD A2C:     85.0 ml LV SV MOD A4C:     148.0 ml LV SV MOD BP:      59.1 ml RIGHT VENTRICLE RV Basal diam:  4.14 cm RV S prime:     10.30 cm/s TAPSE (M-mode): 3.2 cm LEFT ATRIUM             Index       RIGHT ATRIUM           Index LA diam:        4.80 cm 2.25 cm/m  RA Area:     15.70 cm LA Vol (A2C):   68.8 ml 32.20 ml/m RA Volume:   40.00 ml  18.72 ml/m LA Vol (A4C):   77.9 ml 36.45 ml/m LA Biplane Vol: 75.1 ml 35.14 ml/m  AORTIC VALVE                   PULMONIC VALVE AV Area (Vmax):    2.41 cm    PV Vmax:        0.99 m/s AV Area (Vmean):   2.15 cm    PV Peak grad:   3.9 mmHg AV Area (VTI):     2.54 cm    RVOT Peak grad: 4 mmHg AV Vmax:            144.50 cm/s AV Vmean:          94.950 cm/s AV VTI:            0.249 m AV Peak Grad:      8.4 mmHg AV Mean Grad:      4.5 mmHg LVOT Vmax:         111.00 cm/s LVOT Vmean:        65.100 cm/s LVOT VTI:          0.201 m LVOT/AV VTI ratio: 0.81  AORTA Ao Root diam: 2.90 cm MITRAL VALVE                TRICUSPID VALVE MV Area (PHT): 5.13 cm     TR Peak grad:   45.4 mmHg MV Decel Time: 148 msec     TR Vmax:        337.00 cm/s MV E velocity: 129.00 cm/s MV A velocity: 68.40 cm/s   SHUNTS MV E/A ratio:  1.89         Systemic VTI:  0.20 m                             Systemic Diam: 2.00 cm Kathlyn Sacramento MD Electronically signed by Kathlyn Sacramento MD Signature Date/Time: 03/15/2019/11:27:06 AM    Final    ECHOCARDIOGRAM COMPLETE  Result Date: 03/04/2019    ECHOCARDIOGRAM REPORT   Patient Name:   Craig Koch Date of Exam: 03/04/2019 Medical Rec #:  [id number]       Height:       72.0 in Accession #:    [id number]      Weight:       185.0 lb Date of Birth:  Feb 04, 1955       BSA:          2.061 m Patient Age:    49 years        BP:           125/72 mmHg Patient Gender: M               HR:           119 bpm. Exam Location:  [facility] Procedure: 2D Echo, Color Doppler and Cardiac Doppler Indications:     I21.4 NSTEMI  History:         Patient has prior history of Echocardiogram examinations, most                  recent 08/24/2018. CAD, Prior CABG; Risk Factors:Hypertension,                  Dyslipidemia and Diabetes. Lung Cancer.  Sonographer:     Charmayne Sheer RDCS (AE) Referring Phys:  Secretary Phys: Kathlyn Sacramento MD IMPRESSIONS  1. Left ventricular ejection fraction, by estimation, is 30 to 35%. The left ventricle has moderately decreased function. The left ventricle demonstrates global hypokinesis. Left ventricular diastolic parameters are indeterminate.  2. Right ventricular systolic function is normal. The right ventricular size is normal. Tricuspid regurgitation signal is inadequate for assessing PA  pressure.  3. Left atrial size was mild to moderately dilated.  4. The mitral valve is normal in structure and function. Moderate mitral valve regurgitation. No evidence of mitral stenosis.  5. The aortic valve is normal in structure and function. Aortic valve regurgitation is trivial. Mild to moderate aortic valve sclerosis/calcification is present, without any evidence of aortic stenosis.  6. The inferior vena cava is normal in size with greater than 50% respiratory variability, suggesting right atrial pressure of 3 mmHg. FINDINGS  Left Ventricle: Left ventricular ejection fraction, by estimation, is 30 to 35%. The left ventricle has moderately decreased function. The left ventricle demonstrates global hypokinesis. The left ventricular internal cavity size was normal in size. There is no left ventricular hypertrophy. Left ventricular diastolic parameters are indeterminate. Right Ventricle: The right ventricular size is normal. No increase in right ventricular wall thickness. Right ventricular systolic function is normal. Tricuspid regurgitation signal is inadequate for assessing PA pressure. Left Atrium: Left atrial size was mild to moderately dilated. Right Atrium: Right atrial size was normal in size. Pericardium: There is no evidence of pericardial effusion. Mitral Valve: The mitral valve is normal in structure and function. Normal mobility of the mitral valve leaflets. Moderate mitral valve regurgitation. No evidence of mitral valve stenosis. MV peak gradient, 7.5 mmHg. The mean mitral valve gradient is 4.0  mmHg. Tricuspid Valve: The tricuspid valve is normal in structure. Tricuspid valve regurgitation is not demonstrated. No evidence of tricuspid stenosis. Aortic Valve: The aortic valve is normal in structure and function. Aortic valve regurgitation is trivial. Aortic regurgitation PHT measures 281 msec. Mild to moderate aortic valve sclerosis/calcification is present, without any evidence of aortic  stenosis. Aortic valve mean gradient measures 5.0 mmHg. Aortic valve peak gradient measures 8.5 mmHg. Aortic valve area, by VTI measures 2.53 cm. Pulmonic Valve: The pulmonic valve was normal in structure. Pulmonic valve regurgitation is  not visualized. No evidence of pulmonic stenosis. Aorta: The aortic root is normal in size and structure. Venous: The inferior vena cava is normal in size with greater than 50% respiratory variability, suggesting right atrial pressure of 3 mmHg. IAS/Shunts: No atrial level shunt detected by color flow Doppler.  LEFT VENTRICLE PLAX 2D LVIDd:         5.21 cm  Diastology LVIDs:         4.21 cm  LV e' lateral:   7.83 cm/s LV PW:         0.84 cm  LV E/e' lateral: 13.4 LV IVS:        0.89 cm  LV e' medial:    7.94 cm/s LVOT diam:     2.40 cm  LV E/e' medial:  13.2 LV SV:         54 LV SV Index:   26 LVOT Area:     4.52 cm  RIGHT VENTRICLE RV Basal diam:  3.08 cm LEFT ATRIUM             Index       RIGHT ATRIUM           Index LA diam:        4.70 cm 2.28 cm/m  RA Area:     14.10 cm LA Vol (A2C):   62.0 ml 30.08 ml/m RA Volume:   35.20 ml  17.08 ml/m LA Vol (A4C):   68.6 ml 33.29 ml/m LA Biplane Vol: 65.3 ml 31.68 ml/m  AORTIC VALVE                    PULMONIC VALVE AV Area (Vmax):    2.95 cm     PV Vmax:       1.10 m/s AV Area (Vmean):   2.86 cm     PV Vmean:      68.400 cm/s AV Area (VTI):     2.53 cm     PV VTI:        0.146 m AV Vmax:           146.00 cm/s  PV Peak grad:  4.8 mmHg AV Vmean:          103.000 cm/s PV Mean grad:  2.0 mmHg AV VTI:            0.213 m AV Peak Grad:      8.5 mmHg AV Mean Grad:      5.0 mmHg LVOT Vmax:         95.30 cm/s LVOT Vmean:        65.200 cm/s LVOT VTI:          0.119 m LVOT/AV VTI ratio: 0.56 AI PHT:            281 msec  AORTA Ao Root diam: 3.00 cm MITRAL VALVE MV Area (PHT): 3.92 cm     SHUNTS MV Peak grad:  7.5 mmHg     Systemic VTI:  0.12 m MV Mean grad:  4.0 mmHg     Systemic Diam: 2.40 cm MV Vmax:       1.37 m/s MV Vmean:      92.7  cm/s MV Decel Time: 194 msec MV E velocity: 104.80 cm/s Kathlyn Sacramento MD Electronically signed by Kathlyn Sacramento MD Signature Date/Time: 03/04/2019/2:46:43 PM    Final    [place] RT UPPER EXTREM LTD SOFT TISSUE NON VASCULAR  Result Date: 03/08/2019 CLINICAL DATA:  Swelling and bruising of the  right upper extremity. Possible hematoma. EXAM: ULTRASOUND right UPPER EXTREMITY LIMITED TECHNIQUE: Ultrasound examination of the upper extremity soft tissues was performed in the area of clinical concern. COMPARISON:  None. FINDINGS: Diffuse subcutaneous soft tissue swelling/edema and streaky fluid. No discrete fluid collection or hematoma. The underlying musculature is grossly normal. IMPRESSION: Diffuse subcutaneous soft tissue swelling/edema/streaky fluid but no discrete abscess or hematoma. Electronically Signed   By: Marijo Sanes M.D.   On: 03/08/2019 19:13   US THORACENTESIS ASP PLEURAL SPACE W/IMG GUIDE  Result Date: 03/29/2019 INDICATION: Pleural effusion. EXAM: ULTRASOUND GUIDED RIGHT THORACENTESIS MEDICATIONS: None. COMPLICATIONS: None immediate. PROCEDURE: An ultrasound guided thoracentesis was thoroughly discussed with the patient and questions answered. The benefits, risks, alternatives and complications were also discussed. The patient understands and wishes to proceed with the procedure. Written consent was obtained. Ultrasound was performed to localize and mark an adequate pocket of fluid in the right chest. The area was then prepped and draped in the normal sterile fashion. 1% Lidocaine was used for local anesthesia. Under ultrasound guidance a 6 French catheter was introduced. Thoracentesis was performed. The catheter was removed and a dressing applied. FINDINGS: A total of approximately 1 L of yellow fluid was removed. Samples were sent to the laboratory as requested by the clinical team. IMPRESSION: Successful ultrasound guided right thoracentesis yielding 1 L of pleural fluid. Electronically Signed   By:  Marcello Moores  Register   On: 03/29/2019 15:04   US THORACENTESIS ASP PLEURAL SPACE W/IMG GUIDE  Result Date: 03/15/2019 INDICATION: History of lung cancer with recurrent right pleural effusion. Request for diagnostic and therapeutic thoracentesis. EXAM: ULTRASOUND GUIDED RIGHT THORACENTESIS MEDICATIONS: 1% lidocaine 10 mL COMPLICATIONS: None immediate. PROCEDURE: An ultrasound guided thoracentesis was thoroughly discussed with the patient and questions answered. The benefits, risks, alternatives and complications were also discussed. The patient understands and wishes to proceed with the procedure. Written consent was obtained. Ultrasound was performed to localize and mark an adequate pocket of fluid in the right chest. The area was then prepped and draped in the normal sterile fashion. 1% Lidocaine was used for local anesthesia. Under ultrasound guidance a 6 Fr Safe-T-Centesis catheter was introduced. Thoracentesis was performed. The catheter was removed and a dressing applied. FINDINGS: A total of approximately 2.7 L of clear yellow fluid was removed. Samples were sent to the laboratory as requested by the clinical team. IMPRESSION: Successful ultrasound guided right thoracentesis yielding 2.7 L of pleural fluid. No pneumothorax on post-procedure chest x-ray. Read by: Gareth Eagle, PA-C Electronically Signed   By: Sandi Mariscal M.D.   On: 03/15/2019 12:44     ASSESSMENT & PLAN:  1. Malignant neoplasm of unspecified part of unspecified bronchus or lung ([place])   2. Encounter for antineoplastic immunotherapy   3. 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 BRAF targeted therapy was discontinued due to patient's recent acute illness including acute CHF, DKA, renal failure on hemodialysis.  Off hemodialysis. .Status post 1 cycle of Keytruda Labs are reviewed and discussed with  patient.  Stage IV CKD, creatinine increased to 2.28.  Patient has good urine output.  Recommend patient to continue follow-up with nephrology.  Is currently off hemodialysis.  Creatinine may reflect his true kidney function without dialysis.  I advised patient to clarify with nephrology if any protein limitations.  His nutrition status is low.  Albumin 2.7.  If no protein restriction  per nephrology, recommend patient to take nutrition supplements.  Continue follow-up with nutritionist.  Anemia, likely secondary to CKD.  Monitor hemoglobin.  Erythropoietin is contraindicated due to active cancer.  Check iron panel at the next visit.  Pneumonia,, currently on oral antibiotic treatments Chest x-ray was independently reviewed and discussed patient.   Differential includes pneumonia versus disease recurrence versus secondary to immunotherapy treatments.   Diabetes, on insulin.  Follow-up with primary care provider.  Follow-up in 1 week for cycle 2 immunotherapy treatments. Earlie Server, MD, PhD Hematology Oncology [place] [facility] 05/20/2019

## 2019-05-20 NOTE — Progress Notes (Signed)
Pt here for follow up. Pt complains of mild nausea at times, takes antiemetic and resolved problem. He went to cardiology last week and he was started on eliquis and zetia.

## 2019-05-21 ENCOUNTER — Telehealth (INDEPENDENT_AMBULATORY_CARE_PROVIDER_SITE_OTHER): Payer: Self-pay

## 2019-05-21 NOTE — Telephone Encounter (Signed)
A fax was received from Craig Koch at [facility] for a permcath removal. Patient was scheduled with Dr. Delana Meyer on 05/28/19 with a 1:15 pm arrival time to the [facility]. Patient will do covid testing on 05/24/19 between 8-1 pm at the [facility]. Pre-procedure instructions were discussed and will be mailed to patient.

## 2019-05-22 NOTE — Progress Notes (Signed)
Pharmacist Chemotherapy Monitoring - Follow Up Assessment    I verify that I have reviewed each item in the below checklist:  . Regimen for the patient is scheduled for the appropriate day and plan matches scheduled date. [name] Appropriate non-routine labs are ordered dependent on drug ordered. . If applicable, additional medications reviewed and ordered per protocol based on lifetime cumulative doses and/or treatment regimen.   Plan for follow-up and/or issues identified: No . I-vent associated with next due treatment: No . MD and/or nursing notified: No  Craig Koch 05/22/2019 9:07 AM

## 2019-05-24 ENCOUNTER — Other Ambulatory Visit: Payer: Self-pay

## 2019-05-24 ENCOUNTER — Other Ambulatory Visit
Admission: RE | Admit: 2019-05-24 | Discharge: 2019-05-24 | Disposition: A | Discharge status: Home / Self Care | Payer: Medicare Other | Source: Ambulatory Visit | Attending: Vascular Surgery | Admitting: Vascular Surgery

## 2019-05-24 ENCOUNTER — Ambulatory Visit (INDEPENDENT_AMBULATORY_CARE_PROVIDER_SITE_OTHER): Payer: Medicare Other | Admitting: Physician Assistant

## 2019-05-24 ENCOUNTER — Encounter: Payer: Self-pay | Admitting: Physician Assistant

## 2019-05-24 VITALS — BP 100/84 | HR 77 | Ht 72.0 in | Wt 181.5 lb

## 2019-05-24 DIAGNOSIS — Z992 Dependence on renal dialysis: Secondary | ICD-10-CM

## 2019-05-24 DIAGNOSIS — I480 Paroxysmal atrial fibrillation: Secondary | ICD-10-CM

## 2019-05-24 DIAGNOSIS — Z951 Presence of aortocoronary bypass graft: Secondary | ICD-10-CM

## 2019-05-24 DIAGNOSIS — E782 Mixed hyperlipidemia: Secondary | ICD-10-CM | POA: Diagnosis not present

## 2019-05-24 DIAGNOSIS — I10 Essential (primary) hypertension: Secondary | ICD-10-CM

## 2019-05-24 DIAGNOSIS — Z79899 Other long term (current) drug therapy: Secondary | ICD-10-CM

## 2019-05-24 DIAGNOSIS — I252 Old myocardial infarction: Secondary | ICD-10-CM

## 2019-05-24 DIAGNOSIS — N186 End stage renal disease: Secondary | ICD-10-CM | POA: Diagnosis not present

## 2019-05-24 DIAGNOSIS — Z01812 Encounter for preprocedural laboratory examination: Secondary | ICD-10-CM | POA: Insufficient documentation

## 2019-05-24 DIAGNOSIS — R0989 Other specified symptoms and signs involving the circulatory and respiratory systems: Secondary | ICD-10-CM

## 2019-05-24 DIAGNOSIS — I5022 Chronic systolic (congestive) heart failure: Secondary | ICD-10-CM

## 2019-05-24 DIAGNOSIS — I2510 Atherosclerotic heart disease of native coronary artery without angina pectoris: Secondary | ICD-10-CM | POA: Diagnosis not present

## 2019-05-24 DIAGNOSIS — R5383 Other fatigue: Secondary | ICD-10-CM | POA: Diagnosis not present

## 2019-05-24 DIAGNOSIS — Z794 Long term (current) use of insulin: Secondary | ICD-10-CM

## 2019-05-24 DIAGNOSIS — Z20822 Contact with and (suspected) exposure to covid-19: Secondary | ICD-10-CM | POA: Insufficient documentation

## 2019-05-24 DIAGNOSIS — E1122 Type 2 diabetes mellitus with diabetic chronic kidney disease: Secondary | ICD-10-CM

## 2019-05-24 DIAGNOSIS — F17200 Nicotine dependence, unspecified, uncomplicated: Secondary | ICD-10-CM

## 2019-05-24 DIAGNOSIS — Z7901 Long term (current) use of anticoagulants: Secondary | ICD-10-CM

## 2019-05-24 NOTE — Patient Instructions (Signed)
Medication Instructions:  Hold Eliquis 2 days before procedure and restart per vascular physician recommendations.  Amiodarone helps to treat heart rhythm problems.   *If you need a refill on your cardiac medications before your next appointment, please call your pharmacy*   Lab Work: Thyroid panel, Lipid, Direct LDL, CBC, BMET  If you have labs (blood work) drawn today and your tests are completely normal, you will receive your results only by: [name] MyChart Message (if you have MyChart) OR . A paper copy in the mail If you have any lab test that is abnormal or we need to change your treatment, we will call you to review the results.   Testing/Procedures: None   Follow-Up: At [facility], you and your health needs are our priority.  As part of our continuing mission to provide you with exceptional heart care, we have created designated Provider Care Teams.  These Care Teams include your primary Cardiologist (physician) and Advanced Practice Providers (APPs -  Physician Assistants and Nurse Practitioners) who all work together to provide you with the care you need, when you need it.  We recommend signing up for the patient portal called "MyChart".  Sign up information is provided on this After Visit Summary.  MyChart is used to connect with patients for Virtual Visits (Telemedicine).  Patients are able to view lab/test results, encounter notes, upcoming appointments, etc.  Non-urgent messages can be sent to your provider as well.   To learn more about what you can do with MyChart, go to [email or web address].    Your next appointment:   3 month(s)  The format for your next appointment:   In Person  Provider:    You may see Ida Rogue, MD or one of the following Advanced Practice Providers on your designated Care Team:    Murray Hodgkins, NP  Christell Faith, PA-C  Marrianne Mood, PA-C

## 2019-05-24 NOTE — Progress Notes (Signed)
Office Visit    Patient Name: Craig Koch Date of Encounter: 05/24/2019  Primary Care Provider:  Tonia Ghent, MD Primary Cardiologist:  Ida Rogue, MD  Chief Complaint    Chief Complaint  Patient presents with  . OTHER    2 wk f/u c/o sob. Meds reviewed verbally with pt.    64 yo male with history of CAD s/p four-vessel CABG, HFrEF, A. 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  Past Medical History    Past Medical History:  Diagnosis Date  . Anxiety   . Arthritis   . CHF (congestive heart failure) ([place])   . Coronary artery disease   . Diabetes mellitus   . Dyslipidemia   . Hx of CABG   . Hyperlipidemia   . Hypertension   . Malignant neoplasm of unspecified part of unspecified bronchus or lung ([place]) 08/2018   Immunotherapy  . Pleural effusion    Past Surgical History:  Procedure Laterality Date  . CATARACT EXTRACTION Left 09/2015  . CHEST TUBE INSERTION Left 10/01/2018   Procedure: INSERTION PLEURAL DRAINAGE CATHETER;  Surgeon: [name], MD;  Location: [facility] ORS;  Service: Thoracic;  Laterality: Left;  . CORONARY ARTERY BYPASS GRAFT  2004   (CABG with LIMA to the  LAD, SVG to OM2/OM3, SVG  to diag  . DIALYSIS/PERMA CATHETER INSERTION N/A 03/14/2019   Procedure: DIALYSIS/PERMA CATHETER INSERTION;  Surgeon: Algernon Huxley, MD;  Location: [place] [facility];  Service: Cardiovascular;  Laterality: N/A;  . Left ankle surgery     repair of fracture  . Right lower leg surgery     rod  . TEMPORARY DIALYSIS CATHETER N/A 03/11/2019   Procedure: TEMPORARY DIALYSIS CATHETER;  Surgeon: Algernon Huxley, MD;  Location: [place] [facility];  Service: Cardiovascular;  Laterality: N/A;    Allergies  Allergies  Allergen Reactions  . Lipitor [Atorvastatin Calcium] Other (See Comments)    Aches.  Tolerated crestor.     History of Present  Illness    Craig Koch is a 65 y.o. male with PMH as above.  He has history of 2010 stress test with EF 45% and fixed defect involving the inferior wall, most consistent with scar versus hibernating myocardium. Per patient, he also had a stress test in 2012 ruled normal. He is a current smoker at 1/2 pack daily.   He was admitted to [facility] 08/2018 with large pleural effusion and underwent thoracentesis. 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 He has reportedly been followed by oncology and responded well to treatment thus far.   He was seen in the office 09/25/2017 and denied chest pain.  Recommendations were to continue current medication regimen. Weight loss, dietary changes, and smoking cessation were advised.   He most recently was seen at [facility] 03/03/19 after presenting with SOB and found to be in new onset Afib with RVR, AKI requiring HD, NSTEMI, and new HFrEF. It was thought his Afib with RVR was in the setting of underlying metabolic abnormalities.  Rate control was limited by blood pressure.  He was started on amiodarone with improvement in ventricular rate.  He also received several doses of digoxin; however, this was discontinued due to his renal failure.  High-sensitivity troponin elevated greater than 27,000 with suspicion for graft occlusion.  Echo showed moderately reduced LVSF.  He was  continued on medical therapy and heparin given his renal function and cath deferred.  He had severe hyperglycemia noted as well as hyponatremia.  Temporary dialysis catheter was placed and removed by the end of admission with tunneled right IJ hemodialysis catheter placed.  He was discharged 03/19/19 on amiodarone 200 mg daily, apixaban 5 mg twice daily (not actually prescribed at discharge), ASA 81 mg daily (changed to Plavix at discharge), rosuvastatin, and Toprol with recommendation for follow-up in the office. ACE/ARB/ARNI was not added given his soft BP and renal function.   When  last seen in clinic, he reported feeling stronger since discharge with some appetite and weight increase attributed to increased caloric intake. He was walking regularly. He was following closely with oncology and the HD team. He reported possible future removal of his IJ HD catheter. He was asx with hypotension noted that visit (BP 90/50). He reports home SBP as usually similar to slightly higher with SBP 90s to low 100s. He wondered if his low BP was due to HD. He was not on [place] with Eliquis, as this prescription was discontinued at discharge for unclear reasons.  He was down to 1 cigarette per day. Oncology was contacted after this visit and it was verified that this medication was not discontinued per their recommendation. Given upcoming oncology labs, request was made to check TSH with results as below showing elevated TSH at 6.888. Eliquis coupon provided for start of [place]; however, today, patient reports [place] was not started until also discussed with PCP on 05/14/19.  Today, he continues to note feeling improvement since discharge.  He remains in NSR. He denies any CP, racing HR, or palpitations. No presyncope or syncope. He notes intermittent dizziness and dyspnea on exertion that occur only with ambulation and he does not feel are connected with his lower BP. He reports home BP 115/58. He also notes symptoms consistent with orthostatic hypotension that he reports are ongoing for some time now.  He denies any recent shortness of breath at rest or early satiety. He does note ongoing weight gain, attributed to caloric intake and with clinic weights 176lbs  181lbs. He does note PND and shortness of breath that occurs sometimes when in bed and wakes him from sleep; however, this resolves within minutes, allowing him to again lay back down for the night without additional pillows or any other clear alleviating factors. He is often fatigued. He is not aware of any h/o OSA.  He is looking forward to having his IJ HD  catheter removed next Tuesday with discussion of possible start of diuretic at that time. He reports his last HD session occurred 2 weeks ago.  Most recent TSH was discussed with recommendations as below.  He reports medication compliance, including his Eliquis. He denies any signs or symptoms of bleeding.  He does note melena, attributed to his iron supplementation, and not new for him.   Home Medications    Prior to Admission medications   Medication Sig Start Date End Date Taking? Authorizing Provider  acetaminophen (TYLENOL) 325 MG tablet Take 2 tablets (650 mg total) by mouth every 6 (six) hours as needed for mild pain or fever. 03/19/19   Ezekiel Slocumb, DO  albuterol (VENTOLIN HFA) 108 (90 Base) MCG/ACT inhaler Inhale 2 puffs into the lungs every 6 (six) hours as needed for wheezing or shortness of breath. 04/01/19 05/08/19  Wyvonnia Dusky, MD  amiodarone (PACERONE) 200 MG tablet Take 1 tablet (200 mg total) by mouth  daily. 04/23/19 05/23/19  Tonia Ghent, MD  clopidogrel (PLAVIX) 75 MG tablet Take 1 tablet (75 mg total) by mouth daily. 04/23/19 05/23/19  Tonia Ghent, MD  dextromethorphan-guaiFENesin [facility] DM) 30-600 MG 12hr tablet Take 1 tablet by mouth 2 (two) times daily. 03/19/19   Ezekiel Slocumb, DO  dronabinol (MARINOL) 5 MG capsule Take 1 capsule (5 mg total) by mouth 2 (two) times daily before a meal. 04/17/19 05/17/19  Earlie Server, MD  DULoxetine (CYMBALTA) 30 MG capsule Take 1 capsule (30 mg total) by mouth daily. 04/01/19 05/08/19  Wyvonnia Dusky, MD  Ensure Max Protein (ENSURE MAX PROTEIN) LIQD Take 330 mLs (11 oz total) by mouth 2 (two) times daily between meals. 03/19/19   Nicole Kindred A, DO  ferrous sulfate 325 (65 FE) MG tablet Take 1 tablet (325 mg total) by mouth 2 (two) times daily with a meal. 04/23/19 05/23/19  Tonia Ghent, MD  folic acid (FOLVITE) 1 MG tablet Take 1 tablet (1 mg total) by mouth daily. 03/20/19   Nicole Kindred A, DO  insulin aspart  (NOVOLOG) 100 UNIT/ML injection Inject 0-8 Units into the skin as directed. Take 0 units if CBG 70-150 Take 1 units if CBG 151-200 Take 2 units if CBG 201-250 Take 3 units if CBG 251-300 Take 4 units if CBG 301-350 Take 6 units if CBG 351-400 If CBG > 400, give 8 units and call MD 04/15/19   Tonia Ghent, MD  insulin detemir (LEVEMIR) 100 UNIT/ML injection Inject 0.1-0.15 mLs (10-15 Units total) into the skin at bedtime. 04/12/19   Tonia Ghent, MD  levofloxacin (LEVAQUIN) 250 MG tablet Take 2 tablets (500mg ) by mouth x 1 day and then take 1 tablet (250mg ) by mouth daily x 6 days 05/02/19   Borders, Kirt Boys, NP  metoprolol succinate (TOPROL-XL) 25 MG 24 hr tablet Take 0.5 tablets (12.5 mg total) by mouth daily. 04/15/19   Tonia Ghent, MD  multivitamin (RENA-VIT) TABS tablet Take 1 tablet by mouth at bedtime. 03/19/19   Ezekiel Slocumb, DO  polyethylene glycol (MIRALAX / GLYCOLAX) 17 g packet Take 17 g by mouth daily. 03/20/19   Ezekiel Slocumb, DO  promethazine (PHENERGAN) 25 MG tablet Take 1 tablet (25 mg total) by mouth every 6 (six) hours as needed for nausea or vomiting. 04/08/19   Earlie Server, MD  rosuvastatin (CRESTOR) 10 MG tablet TAKE 1 TABLET (10 MG TOTAL) BY MOUTH DAILY. 01/07/19   Tonia Ghent, MD  simethicone (MYLICON) 80 MG chewable tablet Chew 1 tablet (80 mg total) by mouth 4 (four) times daily as needed for flatulence. 03/19/19   Nicole Kindred A, DO  thiamine 100 MG tablet Take 1 tablet (100 mg total) by mouth daily. 03/20/19   Ezekiel Slocumb, DO    Review of Systems    He denies chest pain, palpitations, orthopnea, n, v, syncope, edema, or early satiety. He reports recent weight gain still attributed to caloric intake. He reports DOE and dizziness with ambulation. He PND and SOB with laying in bed but not consistent with orthopnea. He reports fatigue. He reports chronic melena due to iron supplementation and not due to bleeding. All other s/sx of bleeding denied.  All  other systems reviewed and are otherwise negative except as noted above.  Physical Exam    VS:  BP 100/84 (BP Location: Left Arm, Patient Position: Sitting, Cuff Size: Normal)   Pulse 77   Ht 6' (1.829 m)  Wt 181 lb 8 oz (82.3 kg)   SpO2 93%   BMI 24.62 kg/m  , BMI Body mass index is 24.62 kg/m. GEN: Frail and elderly male. Sitting in wheelchair.  HEENT: normal. Neck: Supple, no JVD, L sided carotid bruit, no masses. Cardiac: RRR, 1/6 systolic murmur. No rubs or gallops. No clubbing, cyanosis, edema.  Radials/DP/PT 1+ and equal bilaterally.  Respiratory:  Respirations regular and unlabored, bilaterally decreased bilateral breath sounds. GI: Soft, nontender, nondistended, BS + x 4. MS: no deformity or atrophy. Skin: warm and dry, no rash. Neuro:  Strength and sensation are intact. Psych: Normal affect.  Accessory Clinical Findings    ECG personally reviewed by me today -NSR, 77 bpm, previously noted inferior lateral and anterior infarcts, IVCD with  QRS 161ms, LAD, prolonged QTC 479 ms- no acute changes.  VITALS Reviewed today   Temp Readings from Last 3 Encounters:  05/20/19 (!) 95.2 F (35.1 C)  05/14/19 (!) 97.2 F (36.2 C) (Temporal)  05/08/19 (!) 95.8 F (35.4 C)   BP Readings from Last 3 Encounters:  05/24/19 100/84  05/20/19 (!) 98/59  05/14/19 116/66   Pulse Readings from Last 3 Encounters:  05/24/19 77  05/20/19 67  05/14/19 84    Wt Readings from Last 3 Encounters:  05/24/19 181 lb 8 oz (82.3 kg)  05/20/19 179 lb 4.8 oz (81.3 kg)  05/14/19 177 lb 4 oz (80.4 kg)     LABS  reviewed today   [place] present and most recent? Yes/No: No  Lab Results  Component Value Date   WBC 5.7 05/20/2019   HGB 9.7 (L) 05/20/2019   HCT 29.8 (L) 05/20/2019   MCV 91.7 05/20/2019   PLT 340 05/20/2019   Lab Results  Component Value Date   CREATININE 2.28 (H) 05/20/2019   BUN 42 (H) 05/20/2019   NA 136 05/20/2019   K 4.7 05/20/2019   CL 102  05/20/2019   CO2 24 05/20/2019   Lab Results  Component Value Date   ALT 28 05/20/2019   AST 25 05/20/2019   ALKPHOS 229 (H) 05/20/2019   BILITOT 0.7 05/20/2019   Lab Results  Component Value Date   CHOL 138 03/04/2019   HDL 15 (L) 03/04/2019   LDLCALC 81 03/04/2019   LDLDIRECT 84.0 08/18/2015   TRIG 209 (H) 03/04/2019   CHOLHDL 9.2 03/04/2019    Lab Results  Component Value Date   HGBA1C 7.2 (A) 05/14/2019   Lab Results  Component Value Date   TSH 6.888 (H) 05/20/2019     STUDIES/PROCEDURES reviewed today   2D echo 03/04/2019: 1. Left ventricular ejection fraction, by estimation, is 30 to 35%. The  left ventricle has moderately decreased function. The left ventricle  demonstrates global hypokinesis. Left ventricular diastolic parameters are  indeterminate.  2. Right ventricular systolic function is normal. The right ventricular  size is normal. Tricuspid regurgitation signal is inadequate for assessing  PA pressure.  3. Left atrial size was mild to moderately dilated.  4. The mitral valve is normal in structure and function. Moderate mitral  valve regurgitation. No evidence of mitral stenosis.  5. The aortic valve is normal in structure and function. Aortic valve  regurgitation is trivial. Mild to moderate aortic valve  sclerosis/calcification is present, without any evidence of aortic  stenosis.  6. The inferior vena cava is normal in size with greater than 50%  respiratory variability, suggesting right atrial pressure of 3 mmHg.   Assessment & Plan  Paroxysmal atrial fibrillation --Maintaining NSR on amiodarone 200mg  daily. TSH checked at previous visit and elevated with thyroid panel now pending. Will discuss with primary cardiologist and loop in PCP pending labs. Further recommendations regarding ongoing amiodarone s/p labs. Recent CXR obtained 05/14/19 and with findings attributed to known lung dz and possible PNA, though could consider recheck or  reassessment given on amiodarone. Ongoing close monitoring of QTc on amiodarone recommended. Continue current BB as BP allows.  --Restarted on Eliquis 5mg  BID last visit, which was not continued at time of last discharge for reasons unclear (oncology verified that this was not due to their recommendation). Eliquis coupon provided with official [place] start 5/11 and after discussion with PCP. No recent s/sx of bleeding given melena is chronic and attributed to iron supplementation. Check CBC today s/p start of [place].    Dizziness and DOE with ambulation Hypoxia --Denies dizziness while seated. Dizziness occurs with DOE during ambulation. No racing HR or palpitations. Dizziness / DOE likely multifactorial etiology given known lung dz / CA, CAD, HFrEF, PAF, poorly controlled DM2, known anemia, hypotension, L sided carotid bruit per exam today, and abnormal TSH. No reported s/sx concerning for PE, or sx for DVT tho hypoxic on review of vitals at 93%. 5/11 CXR with concern for PNA and should consider recheck, especially if remains on amiodarone. Also consider VQ scan /with ongoing hypoxia if new s/sx concerning for PE in the future and given renal insufficiency. --Recheck CBC to r/o dizziness 2/2 worsening anemia and check BMET to recheck electrolytes. Given TSH, check thyroid panel as above with recommendations regarding amiodarone pending those results. Bruit heard on exam; however, per patient preference, will defer carotid study for now. Continue to monitor BP at home. Reassess BB dose at RTC and consider decreasing to allow for escalation of therapy as below. Consider recheck of CXR or need for VQ in the future. Smoking cessation advised.   Abnormal TSH  --As above, full thyroid panel ordered given current amiodarone use and abnormal TSH. Will discuss ongoing amiodarone tx with primary cardiologist and loop in PCP pending labs.   Hypotension --Given previously asx with even lower SBP in clinic, low suspicion  for dizziness 2/2 low BP. Not dizzy when seated but reports occurs with DOE during ambulation. In addition, known and unchanged / chronic sx of orthostatic hypotension with recommendation for slow position changes. Escalation of GDMT continues to be limited by hypotension, which may improve if amiodarone is discontinued. Could also consider decreasing current BB to allow for escalation of GDMT. Continue to monitor.  CAD with CABG x4 --No anginal sx reported; however, consider DOE as anginal equivalent. As above in HPI, recent NSTEMI but not a candidate for cath / PCI due to his comorbid conditions (renal function, stage IV lung CA). Eliquis 5mg  BID restarted and in place of ASA 81mg . Of note, he is also on Plavix 75mg  daily. Continue BB as BP allows, as well as rosuvastatin and recently started Zetia for risk factor modification. Recheck of lipids discussed. Escalation of GDMT with ACE/ARB/ARNI given reduced EF 30-35% limited by hypotension.   Chronic HFrEF --Volume status previously managed by nephrology / HD with plans for IJ HD removal next Tuesday. Last HD session two weeks ago. Recent weight gain and DOE with dizziness, as well as PND.  Most recent echo as above with EF 30-35%. Will discuss plan for oral diuretic once HD catheter removed with nephrology.  Check BMET to reassess renal function and electrolytes. Continue current  Toprol 12.5mg  daily with titration as needed given lower pressures and pending possible start of diuretic. As previously noted, escalation of GDMT with ACE/ARB/ARNI limited by hypotension, as well as current report of dizziness with ambulation.   HLD --Continue current statin. Most recent 03/04/19 LDL 84 and above goal of <70; however, he does have a reported statin intolerance. Therefore, will continue current Crestor 10mg  and add Zetia 10mg  daily today with a recheck of lipids in 6-8 weeks to ensure control of LDL.  Most recent liver function wnl.   L sided carotid bruit --Per  patient preference, will defer [place] of carotids at this time. Reassess at RTC given report of dizziness.  Continue current antiplatelet therapy and statin.   Renal insufficiency / Temporary HD --Remains on HD with plans for removal of catheter in the near future. Last HD session two weeks ago with weight gain noted. Suspect need for oral diuretic with discontinuation of HD and will reach out to nephrology to discuss as above.    Anemia --Hgb stable and denies any signs of bleeding other than melena on iron supplementation. Consider anemia as 2/2 chronic illness. Monitored closely by both oncology and nephrology. Recheck given recent start of [place]. Continue current iron supplementation.  DM2, poorly controlled --Recommend glycemic control for risk factor modification. Most recent Hgb A1C elevated improved from 8.8 to 7.2.  Stage IV lung CA --Followed closely by oncology with regular blood work. Consider as contributing to DOE and dizziness. Per oncology. Smoking cessation advised.  Current smoker --Currently smoking 1 cigarette daily. Smoking cessation advised.     Medication changes: Pending check of labs.  Labs ordered: CBC, BMET, thyroid panel, lipids 6-8 weeks from Zetia start.  Studies / Imaging ordered: None.  Future considerations: If BP allows, escalate GDMT. Recommendations regarding continuing amiodarone and starting oral diuretic pending labs and discussion with current care team. Recheck lipids in 6-8 weeks and PCSK9i versus lipid clinic to be considered if still uncontrolled.  Disposition: RTC 3 months or sooner if change medications (pending labs) and to reassess after medication change.   Arvil Chaco, PA-C 05/24/2019

## 2019-05-25 LAB — LIPID PANEL
Chol/HDL Ratio: 2.1 ratio (ref 0.0–5.0)
Cholesterol, Total: 85 mg/dL — ABNORMAL LOW (ref 100–199)
HDL: 41 mg/dL (ref >39)
LDL Chol Calc (NIH): 32 mg/dL (ref 0–99)
Triglycerides: 47 mg/dL (ref 0–149)
VLDL Cholesterol Cal: 12 mg/dL (ref 5–40)

## 2019-05-25 LAB — SARS CORONAVIRUS 2 (TAT 6-24 HRS): SARS Coronavirus 2: NEGATIVE

## 2019-05-25 LAB — BASIC METABOLIC PANEL
BUN/Creatinine Ratio: 18 (ref 10–24)
BUN: 32 mg/dL — ABNORMAL HIGH (ref 8–27)
CO2: 23 mmol/L (ref 20–29)
Calcium: 9 mg/dL (ref 8.6–10.2)
Chloride: 102 mmol/L (ref 96–106)
Creatinine, Ser: 1.82 mg/dL — ABNORMAL HIGH (ref 0.76–1.27)
GFR calc Af Amer: 44 mL/min/{1.73_m2} — ABNORMAL LOW (ref >59)
GFR calc non Af Amer: 38 mL/min/{1.73_m2} — ABNORMAL LOW (ref >59)
Glucose: 84 mg/dL (ref 65–99)
Potassium: 4.2 mmol/L (ref 3.5–5.2)
Sodium: 140 mmol/L (ref 134–144)

## 2019-05-25 LAB — CBC
Hematocrit: 32.5 % — ABNORMAL LOW (ref 37.5–51.0)
Hemoglobin: 10.5 g/dL — ABNORMAL LOW (ref 13.0–17.7)
MCH: 28.7 pg (ref 26.6–33.0)
MCHC: 32.3 g/dL (ref 31.5–35.7)
MCV: 89 fL (ref 79–97)
Platelets: 363 10*3/uL (ref 150–450)
RBC: 3.66 x10E6/uL — ABNORMAL LOW (ref 4.14–5.80)
RDW: 15.3 % (ref 11.6–15.4)
WBC: 6.3 10*3/uL (ref 3.4–10.8)

## 2019-05-25 LAB — THYROID PANEL WITH TSH
Free Thyroxine Index: 2.9 (ref 1.2–4.9)
T3 Uptake Ratio: 35 % (ref 24–39)
T4, Total: 8.3 ug/dL (ref 4.5–12.0)
TSH: 4.73 u[IU]/mL — ABNORMAL HIGH (ref 0.450–4.500)

## 2019-05-25 LAB — LDL CHOLESTEROL, DIRECT: LDL Direct: 31 mg/dL (ref 0–99)

## 2019-05-27 ENCOUNTER — Telehealth: Payer: Self-pay

## 2019-05-27 NOTE — Telephone Encounter (Signed)
Call to patient to review labs.    Pt verbalized understanding and has no further questions at this time.    Advised pt to call for any further questions or concerns.  No further orders.   

## 2019-05-27 NOTE — Telephone Encounter (Signed)
-----   Message from Arvil Chaco, PA-C sent at 05/27/2019  7:24 AM EDT ----- Please let Mr. Deeley know that his cholesterol is controlled with LDL at goal of below 70. His hemoglobin is low at 10.5 but stable when compared with his previous labs and not concerning for a bleed. His renal function is improved from a week ago and potassium at goal. His thyroid panel showed elevated TSH, though less elevated than his previous TSH. I will  reach out to the patient's primary cardiologist with further recommendations regarding his amiodarone at that time if needed.

## 2019-05-28 ENCOUNTER — Encounter
Admission: RE | Disposition: A | Discharge status: Home / Self Care | Payer: Self-pay | Source: Ambulatory Visit | Attending: Vascular Surgery

## 2019-05-28 ENCOUNTER — Ambulatory Visit
Admission: RE | Admit: 2019-05-28 | Discharge: 2019-05-28 | Disposition: A | Discharge status: Home / Self Care | Payer: Medicare Other | Source: Ambulatory Visit | Attending: Vascular Surgery | Admitting: Vascular Surgery

## 2019-05-28 ENCOUNTER — Other Ambulatory Visit (INDEPENDENT_AMBULATORY_CARE_PROVIDER_SITE_OTHER): Payer: Self-pay | Admitting: Nurse Practitioner

## 2019-05-28 ENCOUNTER — Other Ambulatory Visit: Payer: Self-pay

## 2019-05-28 DIAGNOSIS — Z888 Allergy status to other drugs, medicaments and biological substances status: Secondary | ICD-10-CM | POA: Insufficient documentation

## 2019-05-28 DIAGNOSIS — Z79899 Other long term (current) drug therapy: Secondary | ICD-10-CM | POA: Insufficient documentation

## 2019-05-28 DIAGNOSIS — Z794 Long term (current) use of insulin: Secondary | ICD-10-CM | POA: Insufficient documentation

## 2019-05-28 DIAGNOSIS — Z8249 Family history of ischemic heart disease and other diseases of the circulatory system: Secondary | ICD-10-CM | POA: Insufficient documentation

## 2019-05-28 DIAGNOSIS — I2510 Atherosclerotic heart disease of native coronary artery without angina pectoris: Secondary | ICD-10-CM | POA: Insufficient documentation

## 2019-05-28 DIAGNOSIS — I132 Hypertensive heart and chronic kidney disease with heart failure and with stage 5 chronic kidney disease, or end stage renal disease: Secondary | ICD-10-CM | POA: Diagnosis not present

## 2019-05-28 DIAGNOSIS — Z4901 Encounter for fitting and adjustment of extracorporeal dialysis catheter: Secondary | ICD-10-CM | POA: Diagnosis not present

## 2019-05-28 DIAGNOSIS — E785 Hyperlipidemia, unspecified: Secondary | ICD-10-CM | POA: Insufficient documentation

## 2019-05-28 DIAGNOSIS — Z992 Dependence on renal dialysis: Secondary | ICD-10-CM | POA: Diagnosis not present

## 2019-05-28 DIAGNOSIS — T82868A Thrombosis of vascular prosthetic devices, implants and grafts, initial encounter: Secondary | ICD-10-CM | POA: Diagnosis not present

## 2019-05-28 DIAGNOSIS — Z87891 Personal history of nicotine dependence: Secondary | ICD-10-CM | POA: Diagnosis not present

## 2019-05-28 DIAGNOSIS — Z833 Family history of diabetes mellitus: Secondary | ICD-10-CM | POA: Insufficient documentation

## 2019-05-28 DIAGNOSIS — Z951 Presence of aortocoronary bypass graft: Secondary | ICD-10-CM | POA: Diagnosis not present

## 2019-05-28 DIAGNOSIS — Z7901 Long term (current) use of anticoagulants: Secondary | ICD-10-CM | POA: Diagnosis not present

## 2019-05-28 DIAGNOSIS — M1990 Unspecified osteoarthritis, unspecified site: Secondary | ICD-10-CM | POA: Insufficient documentation

## 2019-05-28 DIAGNOSIS — I509 Heart failure, unspecified: Secondary | ICD-10-CM | POA: Diagnosis not present

## 2019-05-28 DIAGNOSIS — N186 End stage renal disease: Secondary | ICD-10-CM | POA: Insufficient documentation

## 2019-05-28 DIAGNOSIS — E1122 Type 2 diabetes mellitus with diabetic chronic kidney disease: Secondary | ICD-10-CM | POA: Diagnosis not present

## 2019-05-28 DIAGNOSIS — E11649 Type 2 diabetes mellitus with hypoglycemia without coma: Secondary | ICD-10-CM | POA: Insufficient documentation

## 2019-05-28 HISTORY — PX: DIALYSIS/PERMA CATHETER REMOVAL: CATH118289

## 2019-05-28 SURGERY — DIALYSIS/PERMA CATHETER REMOVAL
Anesthesia: LOCAL

## 2019-05-28 SURGICAL SUPPLY — 3 items
FORCEPS HALSTEAD CVD 5IN STRL (INSTRUMENTS) ×1 IMPLANT
SCALPEL PROTECTED #11 DISP (BLADE) ×1 IMPLANT
TRAY LACERAT/PLASTIC (MISCELLANEOUS) ×1 IMPLANT

## 2019-05-28 NOTE — Op Note (Signed)
  OPERATIVE NOTE   PROCEDURE: 1. Removal of a right IJ tunneled dialysis catheter  PRE-OPERATIVE DIAGNOSIS: Complication of dialysis catheter, End stage renal disease  POST-OPERATIVE DIAGNOSIS: Same  SURGEON: Hortencia Pilar, M.D.  ANESTHESIA: Local anesthetic with 1% lidocaine with epinephrine   ESTIMATED BLOOD LOSS: Minimal   FINDING(S): 1. Catheter intact   SPECIMEN(S):  Catheter  INDICATIONS:   Craig Koch is a 64 y.o. male who presents with a right IJ tunnel catheter that is no longer being used.  Therefore he is undergoing removal of his tunneled catheter which is no longer needed to avoid septic complications.   DESCRIPTION: After obtaining full informed written consent, the patient was positioned supine. The the right IJ catheter and surrounding area is prepped and draped in a sterile fashion. The cuff was localized by palpation and noted to be less than 3 cm from the exit site. After appropriate timeout is called, 1% lidocaine with epinephrine is infiltrated into the surrounding tissues around the cuff. Small transverse incision is created at the exit site with an 11 blade scalpel and the dissection was carried up along the catheter to expose the cuff of the tunneled catheter.  The catheter cuff is then freed from the surrounding attachments and adhesions. Once the catheter has been freed circumferentially it is removed in 1 piece. Light pressure was held at the base of the neck.   Antibiotic ointment and a sterile dressing is applied to the exit site. Patient tolerated procedure well and there were no complications.  COMPLICATIONS: None  CONDITION: Unchanged  Hortencia Pilar, M.D. [facility] Office: [phone]  05/28/2019,4:58 PM

## 2019-05-28 NOTE — H&P (Signed)
Craig Koch Admission History & Physical  MRN : [id number]  Craig Koch is a 64 y.o. (1955-09-24) male who presents with chief complaint of No chief complaint on file. [name]  History of Present Illness: I am asked to evaluate the patient by the dialysis center. The patient was sent here because they have a nonfunctioning tunneled catheter.  The patient reports they're no longer doing dialysis.  Patient denies pain or tenderness overlying the access.  There is no pain with dialysis.  The patient denies hand pain or finger pain consistent with steal syndrome.  No fevers or chills while on dialysis.   No current facility-administered medications for this encounter.   Current Outpatient Medications  Medication Sig Dispense Refill  . acetaminophen (TYLENOL) 500 MG tablet Take 1,000 mg by mouth every 6 (six) hours as needed for moderate pain or headache.    . albuterol (VENTOLIN HFA) 108 (90 Base) MCG/ACT inhaler Inhale 2 puffs into the lungs every 6 (six) hours as needed for wheezing or shortness of breath. 18 g 0  . amiodarone (PACERONE) 200 MG tablet Take 1 tablet (200 mg total) by mouth daily. 90 tablet 0  . amoxicillin-clavulanate (AUGMENTIN) 875-125 MG tablet Take 1 tablet by mouth 2 (two) times daily. (Patient not taking: Reported on 05/28/2019) 20 tablet 0  . apixaban (ELIQUIS) 5 MG TABS tablet Take 1 tablet (5 mg total) by mouth 2 (two) times daily. 60 tablet 5  . dextromethorphan-guaiFENesin (MUCINEX DM) 30-600 MG 12hr tablet Take 1 tablet by mouth 2 (two) times daily.    [name] doxycycline (VIBRA-TABS) 100 MG tablet Take 1 tablet (100 mg total) by mouth 2 (two) times daily. (Patient not taking: Reported on 05/28/2019) 20 tablet 0  . DULoxetine (CYMBALTA) 30 MG capsule Take 1 capsule (30 mg total) by mouth daily. 30 capsule 0  . ezetimibe (ZETIA) 10 MG tablet Take 1 tablet (10 mg total) by mouth daily. 30 tablet 5  . ferrous sulfate 325 (65 FE) MG tablet Take 1  tablet (325 mg total) by mouth 2 (two) times daily with a meal. 180 tablet 0  . fexofenadine (ALLEGRA) 180 MG tablet Take 180 mg by mouth daily as needed for allergies or rhinitis.    . folic acid (FOLVITE) 1 MG tablet Take 1 tablet (1 mg total) by mouth daily. (Patient not taking: Reported on 05/28/2019)    . HUMALOG 100 UNIT/ML injection Inject 15-20 Units into the skin 3 (three) times daily as needed for high blood sugar.    . insulin detemir (LEVEMIR) 100 UNIT/ML injection Inject 0.15-0.18 mLs (15-18 Units total) into the skin at bedtime. (Patient taking differently: Inject 20 Units into the skin at bedtime. ) 10 mL   . metoprolol succinate (TOPROL-XL) 25 MG 24 hr tablet Take 0.5 tablets (12.5 mg total) by mouth daily. 45 tablet 1  . Multiple Vitamin (MULTIVITAMIN WITH MINERALS) TABS tablet Take 1 tablet by mouth at bedtime.    . promethazine (PHENERGAN) 25 MG tablet Take 1 tablet (25 mg total) by mouth every 6 (six) hours as needed for nausea or vomiting. 60 tablet 0  . rosuvastatin (CRESTOR) 10 MG tablet TAKE 1 TABLET (10 MG TOTAL) BY MOUTH DAILY. (Patient taking differently: Take 10 mg by mouth daily. ) 90 tablet 3  . thiamine 100 MG tablet Take 1 tablet (100 mg total) by mouth daily. (Patient not taking: Reported on 05/28/2019)    . acetaminophen (TYLENOL) 325 MG tablet Take 2 tablets (650  mg total) by mouth every 6 (six) hours as needed for mild pain or fever. (Patient not taking: Reported on 05/28/2019)    . Ensure Max Protein (ENSURE MAX PROTEIN) LIQD Take 330 mLs (11 oz total) by mouth 2 (two) times daily between meals. (Patient not taking: Reported on 05/28/2019)    . insulin aspart (NOVOLOG) 100 UNIT/ML injection Inject 0-8 Units into the skin as directed. Take 0 units if CBG 70-150 Take 1 units if CBG 151-200 Take 2 units if CBG 201-250 Take 3 units if CBG 251-300 Take 4 units if CBG 301-350 Take 6 units if CBG 351-400 If CBG > 400, give 8 units and call MD 10 mL   . multivitamin  (RENA-VIT) TABS tablet Take 1 tablet by mouth at bedtime.  0  . polyethylene glycol (MIRALAX / GLYCOLAX) 17 g packet Take 17 g by mouth daily. 14 each 0  . simethicone (MYLICON) 80 MG chewable tablet Chew 1 tablet (80 mg total) by mouth 4 (four) times daily as needed for flatulence. 30 tablet 0    Past Medical History:  Diagnosis Date  . Anxiety   . Arthritis   . CHF (congestive heart failure) ([place])   . Coronary artery disease   . Diabetes mellitus   . Dyslipidemia   . Hx of CABG   . Hyperlipidemia   . Hypertension   . Malignant neoplasm of unspecified part of unspecified bronchus or lung ([place]) 08/2018   Immunotherapy  . Pleural effusion     Past Surgical History:  Procedure Laterality Date  . CATARACT EXTRACTION Left 09/2015  . CHEST TUBE INSERTION Left 10/01/2018   Procedure: INSERTION PLEURAL DRAINAGE CATHETER;  Surgeon: [name], MD;  Location: [facility];  Service: Thoracic;  Laterality: Left;  . CORONARY ARTERY BYPASS GRAFT  2004   (CABG with LIMA to the  LAD, SVG to OM2/OM3, SVG  to diag  . DIALYSIS/PERMA CATHETER INSERTION N/A 03/14/2019   Procedure: DIALYSIS/PERMA CATHETER INSERTION;  Surgeon: Algernon Huxley, MD;  Location: [facility];  Service: Cardiovascular;  Laterality: N/A;  . Left ankle surgery     repair of fracture  . Right lower leg surgery     rod  . TEMPORARY DIALYSIS CATHETER N/A 03/11/2019   Procedure: TEMPORARY DIALYSIS CATHETER;  Surgeon: Algernon Huxley, MD;  Location: [facility];  Service: Cardiovascular;  Laterality: N/A;    Social History Social History   Tobacco Use  . Smoking status: Former Smoker    Packs/day: 0.20    Years: 45.00    Pack years: 9.00    Types: Cigarettes    Quit date: 03/05/2019    Years since quitting: 0.2  . Smokeless tobacco: Former Systems developer    Types: Snuff  Substance Use Topics  . Alcohol use: Not Currently    Alcohol/week: 6.0 standard drinks    Types: 6 Cans of beer per week    Comment: occ, average 6  pack in a week  . Drug use: No    Family History Family History  Problem Relation Age of Onset  . Dementia Mother   . Heart disease Father   . Colon cancer Neg Hx   . Prostate cancer Neg Hx   . Diabetes Neg Hx     No family history of bleeding or clotting disorders, autoimmune disease or porphyria  Allergies  Allergen Reactions  . Lipitor [Atorvastatin Calcium] Other (See Comments)    Aches.  Tolerated crestor.  REVIEW OF SYSTEMS (Negative unless checked)  Constitutional: [] Weight loss  [] Fever  [] Chills Cardiac: [] Chest pain   [] Chest pressure   [] Palpitations   [] Shortness of breath when laying flat   [] Shortness of breath at rest   [x] Shortness of breath with exertion. Vascular:  [] Pain in legs with walking   [] Pain in legs at rest   [] Pain in legs when laying flat   [] Claudication   [] Pain in feet when walking  [] Pain in feet at rest  [] Pain in feet when laying flat   [] History of DVT   [] Phlebitis   [] Swelling in legs   [] Varicose veins   [] Non-healing ulcers Pulmonary:   [] Uses home oxygen   [] Productive cough   [] Hemoptysis   [] Wheeze  [] COPD   [] Asthma Neurologic:  [] Dizziness  [] Blackouts   [] Seizures   [] History of stroke   [] History of TIA  [] Aphasia   [] Temporary blindness   [] Dysphagia   [] Weakness or numbness in arms   [] Weakness or numbness in legs Musculoskeletal:  [] Arthritis   [] Joint swelling   [] Joint pain   [] Low back pain Hematologic:  [] Easy bruising  [] Easy bleeding   [] Hypercoagulable state   [] Anemic  [] Hepatitis Gastrointestinal:  [] Blood in stool   [] Vomiting blood  [] Gastroesophageal reflux/heartburn   [] Difficulty swallowing. Genitourinary:  [x] Chronic kidney disease   [] Difficult urination  [] Frequent urination  [] Burning with urination   [] Blood in urine Skin:  [] Rashes   [] Ulcers   [] Wounds Psychological:  [] History of anxiety   []  History of major depression.  Physical Examination  Vitals:   05/28/19 1325 05/28/19 1459  BP: 120/77 114/64   Pulse: 73 72  Resp: 18 16  Temp: 97.8 F (36.6 C)   TempSrc: Oral   SpO2: 98% 99%   There is no height or weight on file to calculate BMI. Gen: WD/WN, NAD Head: [place]/AT, No temporalis wasting. Prominent temp pulse not noted. Ear/Nose/Throat: Hearing grossly intact, nares w/o erythema or drainage, oropharynx w/o Erythema/Exudate,  Eyes: Conjunctiva clear, sclera non-icteric Neck: Trachea midline.  No JVD.  Pulmonary:  Good air movement, respirations not labored, no use of accessory muscles.  Cardiac: RRR, normal S1, S2. Vascular: Right IJ tunnel catheter clean dry and intact Vessel Right Left  Radial Palpable Palpable  Ulnar Not Palpable Not Palpable  Brachial Palpable Palpable  Gastrointestinal: soft, non-tender/non-distended. No guarding/reflex.  Musculoskeletal: M/S 5/5 throughout.  Extremities without ischemic changes.  No deformity or atrophy.  Neurologic: Sensation grossly intact in extremities.  Symmetrical.  Speech is fluent. Motor exam as listed above. Psychiatric: Judgment intact, Mood & affect appropriate for pt's clinical situation. Dermatologic: No rashes or ulcers noted.  No cellulitis or open wounds. Lymph : No Cervical, Axillary, or Inguinal lymphadenopathy.   CBC Lab Results  Component Value Date   WBC 6.3 05/24/2019   HGB 10.5 (L) 05/24/2019   HCT 32.5 (L) 05/24/2019   MCV 89 05/24/2019   PLT 363 05/24/2019    BMET    Component Value Date/Time   NA 140 05/24/2019 1042   K 4.2 05/24/2019 1042   CL 102 05/24/2019 1042   CO2 23 05/24/2019 1042   GLUCOSE 84 05/24/2019 1042   GLUCOSE 233 (H) 05/20/2019 0817   BUN 32 (H) 05/24/2019 1042   CREATININE 1.82 (H) 05/24/2019 1042   CALCIUM 9.0 05/24/2019 1042   GFRNONAA 38 (L) 05/24/2019 1042   GFRAA 44 (L) 05/24/2019 1042   Estimated Creatinine Clearance: 45 mL/min (A) (by C-G formula based on SCr  of 1.82 mg/dL (H)).  COAG Lab Results  Component Value Date   INR 1.2 03/03/2019    Radiology DG Chest  2 View  Result Date: 05/14/2019 CLINICAL DATA:  Shortness of breath, lung cancer EXAM: CHEST - 2 VIEW COMPARISON:  05/01/2019 FINDINGS: Right dialysis catheter remains in place, unchanged. Prior CABG. Heart is normal size. Patchy opacities peripherally in the right lung and throughout the left lung. Left lung opacities have increased since prior study. This is concerning for pneumonia superimposed on areas of scarring. Small left pleural effusions suspected. No right effusion. IMPRESSION: Patchy peripheral airspace opacities throughout the right lung are similar to prior study. Increasing airspace disease throughout the left lung concerning for pneumonia. Small left effusion. Electronically Signed   By: Rolm Baptise M.D.   On: 05/14/2019 14:10   DG Chest 2 View  Result Date: 05/01/2019 CLINICAL DATA:  Shortness of breath EXAM: CHEST - 2 VIEW COMPARISON:  March 29, 2019 FINDINGS: There is a persistent small left pleural effusion. There is scarring and atelectasis throughout portions of the left mid and lower lung regions. There is new ill-defined airspace opacity in the right lower lobe. Heart is upper normal in size with pulmonary vascularity normal. Patient is status post coronary artery bypass grafting. Central catheter tip is in the superior vena cava. No pneumothorax. No bone lesions. IMPRESSION: New area of somewhat ill-defined opacity in the lateral right base, concerning for early pneumonia. Persistent fairly small left pleural effusion with atelectatic change in scarring left mid and lower lung zones. No new opacity on the left. Stable cardiac silhouette. Postoperative changes. Central catheter tip in superior vena cava without pneumothorax. Electronically Signed   By: Lowella Grip III M.D.   On: 05/01/2019 16:04   PERIPHERAL VASCULAR CATHETERIZATION  Result Date: 05/28/2019 See op note   Assessment/Plan 1.  Complication dialysis device with thrombosis AV access:  Patient's right IJ  tunneled catheter is malfunctioning. The patient will undergo removal of the tunneled catheter under local anesthesia.  The risks and benefits were described to the patient.  All questions were answered.  The patient agrees to proceed with angiography and intervention. Potassium will be drawn to ensure that it is an appropriate level prior to performing intervention. 2.  End-stage renal disease requiring hemodialysis:  Patient will continue dialysis therapy without further interruption if a successful intervention is not achieved then a tunneled catheter will be placed. Dialysis has already been arranged. 3.  Hypertension:  Patient will continue medical management; nephrology is following no changes in oral medications. 4. Diabetes mellitus:  Glucose will be monitored and oral medications been held this morning once the patient has undergone the patient's procedure po intake will be reinitiated and again Accu-Cheks will be used to assess the blood glucose level and treat as needed. The patient will be restarted on the patient's usual hypoglycemic regime 5.  Coronary artery disease:  EKG will be monitored. Nitrates will be used if needed. The patient's oral cardiac medications will be continued.    Hortencia Pilar, MD  05/28/2019 4:47 PM

## 2019-05-28 NOTE — Progress Notes (Signed)
Pt prescreened for appointment. No new concerns voiced.

## 2019-05-28 NOTE — Discharge Instructions (Signed)
Tunneled Catheter Removal, Care After Refer to this sheet in the next few weeks. These instructions provide you with information about caring for yourself after your procedure. Your health care provider may also give you more specific instructions. Your treatment has been planned according to current medical practices, but problems sometimes occur. Call your health care provider if you have any problems or questions after your procedure. What can I expect after the procedure? After the procedure, it is common to have: Some mild redness, swelling, and pain around your catheter site.   Follow these instructions at home: Incision care  Check your removal site  every day for signs of infection. Check for: More redness, swelling, or pain. More fluid or blood. Warmth. Pus or a bad smell. Remove your dressing in 48hrs leave open to air  Activity  Return to your normal activities as told by your health care provider. Ask your health care provider what activities are safe for you. Do not lift anything that is heavier than 10 lb (4.5 kg) for 3 days  You may shower tomorrow  Contact a health care provider if: You have more fluid or blood coming from your removal site You have more redness, swelling, or pain at your incisions or around the area where your catheter was removed Your removal site feel warm to the touch. You feel unusually weak. You feel nauseous.. Get help right away if You have swelling in your arm, shoulder, neck, or face. You develop chest pain. You have difficulty breathing. You feel dizzy or light-headed. You have pus or a bad smell coming from your removal site You have a fever. You develop bleeding from your removal site, and your bleeding does not stop. This information is not intended to replace advice given to you by your health care provider. Make sure you discuss any questions you have with your health care provider. Document Released: 12/07/2011 Document Revised:  08/23/2015 Document Reviewed: 09/15/2014 Elsevier Interactive Patient Education  2017 [employer] 

## 2019-05-29 ENCOUNTER — Ambulatory Visit: Payer: Medicare Other | Admitting: Family

## 2019-05-29 ENCOUNTER — Encounter: Payer: Self-pay | Admitting: Cardiology

## 2019-05-29 ENCOUNTER — Inpatient Hospital Stay: Payer: Medicare Other

## 2019-05-29 ENCOUNTER — Encounter: Payer: Self-pay | Admitting: Family

## 2019-05-29 ENCOUNTER — Ambulatory Visit: Payer: Medicare Other | Attending: Family | Admitting: Family

## 2019-05-29 ENCOUNTER — Inpatient Hospital Stay (HOSPITAL_BASED_OUTPATIENT_CLINIC_OR_DEPARTMENT_OTHER): Payer: Medicare Other | Admitting: Oncology

## 2019-05-29 VITALS — BP 97/57 | HR 80 | Temp 94.6°F | Resp 18 | Wt 180.7 lb

## 2019-05-29 VITALS — BP 110/68 | HR 77 | Temp 96.2°F | Resp 16

## 2019-05-29 VITALS — BP 115/67 | HR 80 | Resp 18 | Ht 72.0 in | Wt 181.4 lb

## 2019-05-29 DIAGNOSIS — M1990 Unspecified osteoarthritis, unspecified site: Secondary | ICD-10-CM | POA: Insufficient documentation

## 2019-05-29 DIAGNOSIS — J440 Chronic obstructive pulmonary disease with acute lower respiratory infection: Secondary | ICD-10-CM | POA: Diagnosis not present

## 2019-05-29 DIAGNOSIS — Z951 Presence of aortocoronary bypass graft: Secondary | ICD-10-CM | POA: Insufficient documentation

## 2019-05-29 DIAGNOSIS — Z7902 Long term (current) use of antithrombotics/antiplatelets: Secondary | ICD-10-CM | POA: Diagnosis not present

## 2019-05-29 DIAGNOSIS — C3490 Malignant neoplasm of unspecified part of unspecified bronchus or lung: Secondary | ICD-10-CM

## 2019-05-29 DIAGNOSIS — Z87891 Personal history of nicotine dependence: Secondary | ICD-10-CM | POA: Insufficient documentation

## 2019-05-29 DIAGNOSIS — N1832 Chronic kidney disease, stage 3b: Secondary | ICD-10-CM

## 2019-05-29 DIAGNOSIS — I10 Essential (primary) hypertension: Secondary | ICD-10-CM

## 2019-05-29 DIAGNOSIS — I130 Hypertensive heart and chronic kidney disease with heart failure and stage 1 through stage 4 chronic kidney disease, or unspecified chronic kidney disease: Secondary | ICD-10-CM | POA: Insufficient documentation

## 2019-05-29 DIAGNOSIS — Z7901 Long term (current) use of anticoagulants: Secondary | ICD-10-CM | POA: Insufficient documentation

## 2019-05-29 DIAGNOSIS — I5022 Chronic systolic (congestive) heart failure: Secondary | ICD-10-CM

## 2019-05-29 DIAGNOSIS — Z794 Long term (current) use of insulin: Secondary | ICD-10-CM | POA: Diagnosis not present

## 2019-05-29 DIAGNOSIS — N184 Chronic kidney disease, stage 4 (severe): Secondary | ICD-10-CM

## 2019-05-29 DIAGNOSIS — Z5112 Encounter for antineoplastic immunotherapy: Secondary | ICD-10-CM | POA: Diagnosis not present

## 2019-05-29 DIAGNOSIS — J189 Pneumonia, unspecified organism: Secondary | ICD-10-CM | POA: Diagnosis not present

## 2019-05-29 DIAGNOSIS — Z8249 Family history of ischemic heart disease and other diseases of the circulatory system: Secondary | ICD-10-CM | POA: Insufficient documentation

## 2019-05-29 DIAGNOSIS — N189 Chronic kidney disease, unspecified: Secondary | ICD-10-CM | POA: Insufficient documentation

## 2019-05-29 DIAGNOSIS — F419 Anxiety disorder, unspecified: Secondary | ICD-10-CM | POA: Insufficient documentation

## 2019-05-29 DIAGNOSIS — Z7189 Other specified counseling: Secondary | ICD-10-CM | POA: Diagnosis not present

## 2019-05-29 DIAGNOSIS — Z79899 Other long term (current) drug therapy: Secondary | ICD-10-CM | POA: Diagnosis not present

## 2019-05-29 DIAGNOSIS — E785 Hyperlipidemia, unspecified: Secondary | ICD-10-CM | POA: Insufficient documentation

## 2019-05-29 DIAGNOSIS — E1136 Type 2 diabetes mellitus with diabetic cataract: Secondary | ICD-10-CM | POA: Diagnosis not present

## 2019-05-29 DIAGNOSIS — E1122 Type 2 diabetes mellitus with diabetic chronic kidney disease: Secondary | ICD-10-CM | POA: Insufficient documentation

## 2019-05-29 DIAGNOSIS — I509 Heart failure, unspecified: Secondary | ICD-10-CM | POA: Diagnosis present

## 2019-05-29 DIAGNOSIS — J910 Malignant pleural effusion: Secondary | ICD-10-CM | POA: Diagnosis not present

## 2019-05-29 DIAGNOSIS — N186 End stage renal disease: Secondary | ICD-10-CM | POA: Diagnosis not present

## 2019-05-29 DIAGNOSIS — Z888 Allergy status to other drugs, medicaments and biological substances status: Secondary | ICD-10-CM | POA: Diagnosis not present

## 2019-05-29 DIAGNOSIS — I2510 Atherosclerotic heart disease of native coronary artery without angina pectoris: Secondary | ICD-10-CM | POA: Insufficient documentation

## 2019-05-29 LAB — COMPREHENSIVE METABOLIC PANEL
ALT: 23 U/L (ref 0–44)
AST: 19 U/L (ref 15–41)
Albumin: 2.7 g/dL — ABNORMAL LOW (ref 3.5–5.0)
Alkaline Phosphatase: 224 U/L — ABNORMAL HIGH (ref 38–126)
Anion gap: 6 (ref 5–15)
BUN: 34 mg/dL — ABNORMAL HIGH (ref 8–23)
CO2: 26 mmol/L (ref 22–32)
Calcium: 8.5 mg/dL — ABNORMAL LOW (ref 8.9–10.3)
Chloride: 104 mmol/L (ref 98–111)
Creatinine, Ser: 1.99 mg/dL — ABNORMAL HIGH (ref 0.61–1.24)
GFR calc Af Amer: 40 mL/min — ABNORMAL LOW (ref >60)
GFR calc non Af Amer: 34 mL/min — ABNORMAL LOW (ref >60)
Glucose, Bld: 234 mg/dL — ABNORMAL HIGH (ref 70–99)
Potassium: 5 mmol/L (ref 3.5–5.1)
Sodium: 136 mmol/L (ref 135–145)
Total Bilirubin: 0.6 mg/dL (ref 0.3–1.2)
Total Protein: 7.1 g/dL (ref 6.5–8.1)

## 2019-05-29 LAB — CBC WITH DIFFERENTIAL/PLATELET
Abs Immature Granulocytes: 0.03 10*3/uL (ref 0.00–0.07)
Basophils Absolute: 0.1 10*3/uL (ref 0.0–0.1)
Basophils Relative: 1 %
Eosinophils Absolute: 0.3 10*3/uL (ref 0.0–0.5)
Eosinophils Relative: 5 %
HCT: 32 % — ABNORMAL LOW (ref 39.0–52.0)
Hemoglobin: 10.3 g/dL — ABNORMAL LOW (ref 13.0–17.0)
Immature Granulocytes: 1 %
Lymphocytes Relative: 9 %
Lymphs Abs: 0.5 10*3/uL — ABNORMAL LOW (ref 0.7–4.0)
MCH: 29.9 pg (ref 26.0–34.0)
MCHC: 32.2 g/dL (ref 30.0–36.0)
MCV: 93 fL (ref 80.0–100.0)
Monocytes Absolute: 0.5 10*3/uL (ref 0.1–1.0)
Monocytes Relative: 9 %
Neutro Abs: 4.3 10*3/uL (ref 1.7–7.7)
Neutrophils Relative %: 75 %
Platelets: 289 10*3/uL (ref 150–400)
RBC: 3.44 MIL/uL — ABNORMAL LOW (ref 4.22–5.81)
RDW: 17.5 % — ABNORMAL HIGH (ref 11.5–15.5)
WBC: 5.7 10*3/uL (ref 4.0–10.5)
nRBC: 0 % (ref 0.0–0.2)

## 2019-05-29 MED ORDER — SODIUM CHLORIDE 0.9 % IV SOLN
200.0000 mg | Freq: Once | INTRAVENOUS | Status: AC
  Administered 2019-05-29: 200 mg via INTRAVENOUS
  Filled 2019-05-29: qty 8

## 2019-05-29 MED ORDER — SODIUM CHLORIDE 0.9 % IV SOLN
Freq: Once | INTRAVENOUS | Status: AC
  Filled 2019-05-29: qty 250

## 2019-05-29 MED ORDER — FUROSEMIDE 20 MG PO TABS
20.0000 mg | ORAL_TABLET | Freq: Every day | ORAL | 3 refills | Status: DC
Start: 2019-05-29 — End: 2019-06-14

## 2019-05-29 NOTE — Progress Notes (Signed)
Hematology/Oncologyfollow up  note [facility] [phone] [phone]   Patient Care Team: Tonia Ghent, MD as PCP - General (Family Medicine) Thelma Comp, [place] (Optometry) Telford Nab, RN as Registered Nurse  REFERRING PROVIDER: Tonia Ghent, MD  CHIEF COMPLAINTS/REASON FOR VISIT:  Follow-up for metastatic lung adenocarcinoma  HISTORY OF PRESENTING ILLNESS:   Craig Koch is a  64 y.o.  male with PMH listed below was seen in consultation at the request of  Tonia Ghent, MD  for evaluation of malignant pleural effusion. Patient was recently admitted to [facility] due to large amount of pleural effusion, acute hypoxic respiratory failure. Patient reports feeling shortness of breath and dyspnea on exertion for several weeks and presented to emergency room.  A chest x-ray 08/23/2018 showed a large amount of left pleural effusion 08/24/2018 CT chest abdomen pelvis confirmed large left pleural effusion with near complete collapse of the left lower lobe and extensive volume loss in the lingula and a portion of the left upper lobe. No acute intra-abdominal process.  Cholelithiasis, diverticulitis, coronary atherosclerosis post CABG.  Bilateral renal cortical scarring.  Aortic atherosclerosis.  #08/24/2018 ultrasound guided diagnostic and therapeutic thoracentesis removed 1.8 L of pleural fluid. Patient subsequently felt better and was discharged to follow-up with pulmonology. 09/04/2018 patient followed up with Dr.Aleskerov chest x-ray showed recurrent moderate left pleural effusion.  Patient underwent ultrasound-guided left thoracentesis again and it drained 3 L of fluid. Pleural fluid cytology positive for adenocarcinoma. Patient was referred to me for further evaluation and management.  # PET eft sided pleural hypermetabolic activity  consistent with malignant pleural effusion. Left upper and lower lobe low-level hypermetabolic him  corresponding to areas of airspace and groundglass opacity.  This was felt to be secondary to atelectasis.  Underlying left upper lobe primary bronchogenic carcinoma cannot be excluded. Medial left upper lobe area more focal hypermetabolic area could represent a pleural disease or a site of small left upper lobe primary. No evidence of extra thoracic hypermetabolic metastatic disease.  Left sided hydro-pneumothorax within the pleural air component being new since the prior CT. Cholelithiasis.  #MRI brain showed punctate focus of enhancement within the right parietal lobe cortex suspicious for possible metastasis. Molecular study showed BRAF V600 mutation. 11/16/2018 started on dabrafenib 150 mg twice daily and trametinib 2 mg daily.  He had good response  #Patient was admitted from 03/03/2019-03/19/2019 due to DKA due to uncontrolled diabetes, acute decompensation of CHF left lower lobe pneumonia right pleural effusion status post thoracentesis.  Fluid study was consistent with transudate. AKI secondary to ATN, on hemodialysis Tuesday Thursday and Saturday, atrial fibrillation with RVR, paroxysmal.  Patient was discharged on Eliquis and amiodarone. Patient was readmitted from 03/26/2019-04/01/2019 due to CHF exacerbation, right pleural effusion recurrent.  Status post diagnostic and therapeutic thoracentesis.  Cytology was negative for malignant cells.   INTERVAL HISTORY Craig Koch is a 64 y.o. male who has above history reviewed by me today presents for follow-up of lung cancer Problems and complaints are listed below:  Patient was accompanied by his daughter today.  Patient continues to have chronic shortness of breath with exertion.  He feels that his breathing status has not changed significantly. He finished a course of antibiotics.  Feels that shortness of breath has no change   Review of Systems  Constitutional: Positive for fatigue. Negative for appetite change, chills, diaphoresis,  fever and unexpected weight change.  HENT:   Negative for hearing loss, lump/mass, nosebleeds, sore  throat and voice change.   Eyes: Negative for eye problems and icterus.  Respiratory: Positive for shortness of breath. Negative for chest tightness, cough, hemoptysis and wheezing.   Cardiovascular: Negative for chest pain and leg swelling.  Gastrointestinal: Negative for abdominal distention, abdominal pain, blood in stool, diarrhea, nausea and rectal pain.  Endocrine: Negative for hot flashes.  Genitourinary: Negative for bladder incontinence, difficulty urinating, dysuria, frequency, hematuria and nocturia.   Musculoskeletal: Negative for arthralgias, back pain, flank pain, gait problem and myalgias.  Skin: Negative for itching and rash.  Neurological: Negative for dizziness, gait problem, headaches, light-headedness, numbness and seizures.  Hematological: Negative for adenopathy. Bruises/bleeds easily.  Psychiatric/Behavioral: Negative for confusion and decreased concentration. The patient is not nervous/anxious.     MEDICAL HISTORY:  Past Medical History:  Diagnosis Date  . Anxiety   . Arthritis   . CHF (congestive heart failure) ([place])   . Coronary artery disease   . Diabetes mellitus   . Dyslipidemia   . Hx of CABG   . Hyperlipidemia   . Hypertension   . Malignant neoplasm of unspecified part of unspecified bronchus or lung ([place]) 08/2018   Immunotherapy  . Pleural effusion     SURGICAL HISTORY: Past Surgical History:  Procedure Laterality Date  . CATARACT EXTRACTION Left 09/2015  . CHEST TUBE INSERTION Left 10/01/2018   Procedure: INSERTION PLEURAL DRAINAGE CATHETER;  Surgeon: [name], MD;  Location: [facility] ORS;  Service: Thoracic;  Laterality: Left;  . CORONARY ARTERY BYPASS GRAFT  2004   (CABG with LIMA to the  LAD, SVG to OM2/OM3, SVG  to diag  . DIALYSIS/PERMA CATHETER INSERTION N/A 03/14/2019   Procedure: DIALYSIS/PERMA CATHETER INSERTION;  Surgeon: Algernon Huxley,  MD;  Location: [place] [facility];  Service: Cardiovascular;  Laterality: N/A;  . DIALYSIS/PERMA CATHETER REMOVAL N/A 05/28/2019   Procedure: DIALYSIS/PERMA CATHETER REMOVAL;  Surgeon: Katha Cabal, MD;  Location: [place] [facility];  Service: Cardiovascular;  Laterality: N/A;  . Left ankle surgery     repair of fracture  . Right lower leg surgery     rod  . TEMPORARY DIALYSIS CATHETER N/A 03/11/2019   Procedure: TEMPORARY DIALYSIS CATHETER;  Surgeon: Algernon Huxley, MD;  Location: [place] [facility];  Service: Cardiovascular;  Laterality: N/A;   SOCIAL HISTORY: Social History   Socioeconomic History  . Marital status: Married    Spouse name: Not on file  . Number of children: Not on file  . Years of education: Not on file  . Highest education level: Not on file  Occupational History  . Not on file  Tobacco Use  . Smoking status: Former Smoker    Packs/day: 0.20    Years: 45.00    Pack years: 9.00    Types: Cigarettes    Quit date: 03/05/2019    Years since quitting: 0.2  . Smokeless tobacco: Former Systems developer    Types: Snuff  Substance and Sexual Activity  . Alcohol use: Not Currently    Alcohol/week: 6.0 standard drinks    Types: 6 Cans of beer per week    Comment: occ, average 6 pack in a week  . Drug use: No  . Sexual activity: Not Currently  Other Topics Concern  . Not on file  Social History Narrative   On disability 2009 after prev injuries and CAD.     Married 1976   2 kids, 4 grandkids.    Social Determinants of Health   Financial Resource Strain:   .  Difficulty of Paying Living Expenses:   Food Insecurity:   . Worried About Charity fundraiser in the Last Year:   . Arboriculturist in the Last Year:   Transportation Needs:   . Film/video editor (Medical):   [name] Lack of Transportation (Non-Medical):   Physical Activity:   . Days of Exercise per Week:   . Minutes of Exercise per Session:   Stress:   . Feeling of Stress :   Social Connections:   .  Frequency of Communication with Friends and Family:   . Frequency of Social Gatherings with Friends and Family:   . Attends Religious Services:   . Active Member of Clubs or Organizations:   . Attends Archivist Meetings:   [name] Marital Status:   Intimate Partner Violence:   . Fear of Current or Ex-Partner:   . Emotionally Abused:   [name] Physically Abused:   . Sexually Abused:      FAMILY HISTORY: Family History  Problem Relation Age of Onset  . Dementia Mother   . Heart disease Father   . Colon cancer Neg Hx   . Prostate cancer Neg Hx   . Diabetes Neg Hx    ALLERGIES:  Allergies  Allergen Reactions  . Lipitor [Atorvastatin Calcium] Other (See Comments)    Aches.  Tolerated crestor.      MEDICATIONS: PHYSICAL EXAMINATION: Current Outpatient Medications on File Prior to Visit  Medication Sig Dispense Refill  . albuterol (VENTOLIN HFA) 108 (90 Base) MCG/ACT inhaler Inhale 2 puffs into the lungs every 6 (six) hours as needed for wheezing or shortness of breath. 18 g 0  . amiodarone (PACERONE) 200 MG tablet Take 1 tablet (200 mg total) by mouth daily. 90 tablet 0  . apixaban (ELIQUIS) 5 MG TABS tablet Take 1 tablet (5 mg total) by mouth 2 (two) times daily. 60 tablet 5  . dextromethorphan-guaiFENesin (MUCINEX DM) 30-600 MG 12hr tablet Take 1 tablet by mouth 2 (two) times daily.    . DULoxetine (CYMBALTA) 30 MG capsule Take 1 capsule (30 mg total) by mouth daily. 30 capsule 0  . ezetimibe (ZETIA) 10 MG tablet Take 1 tablet (10 mg total) by mouth daily. 30 tablet 5  . ferrous sulfate 325 (65 FE) MG tablet Take 1 tablet (325 mg total) by mouth 2 (two) times daily with a meal. 180 tablet 0  . HUMALOG 100 UNIT/ML injection Inject 15-20 Units into the skin 3 (three) times daily as needed for high blood sugar.    . insulin aspart (NOVOLOG) 100 UNIT/ML injection Inject 0-8 Units into the skin as directed. Take 0 units if CBG 70-150 Take 1 units if CBG 151-200 Take 2 units if CBG  201-250 Take 3 units if CBG 251-300 Take 4 units if CBG 301-350 Take 6 units if CBG 351-400 If CBG > 400, give 8 units and call MD 10 mL   . insulin detemir (LEVEMIR) 100 UNIT/ML injection Inject 0.15-0.18 mLs (15-18 Units total) into the skin at bedtime. (Patient taking differently: Inject 20 Units into the skin at bedtime. ) 10 mL   . metoprolol succinate (TOPROL-XL) 25 MG 24 hr tablet Take 0.5 tablets (12.5 mg total) by mouth daily. 45 tablet 1  . Multiple Vitamin (MULTIVITAMIN WITH MINERALS) TABS tablet Take 1 tablet by mouth at bedtime.    . multivitamin (RENA-VIT) TABS tablet Take 1 tablet by mouth at bedtime.  0  . polyethylene glycol (MIRALAX / GLYCOLAX) 17 g packet  Take 17 g by mouth daily. 14 each 0  . promethazine (PHENERGAN) 25 MG tablet Take 1 tablet (25 mg total) by mouth every 6 (six) hours as needed for nausea or vomiting. 60 tablet 0  . rosuvastatin (CRESTOR) 10 MG tablet TAKE 1 TABLET (10 MG TOTAL) BY MOUTH DAILY. (Patient taking differently: Take 10 mg by mouth daily. ) 90 tablet 3  . simethicone (MYLICON) 80 MG chewable tablet Chew 1 tablet (80 mg total) by mouth 4 (four) times daily as needed for flatulence. 30 tablet 0  . acetaminophen (TYLENOL) 325 MG tablet Take 2 tablets (650 mg total) by mouth every 6 (six) hours as needed for mild pain or fever. (Patient not taking: Reported on 05/28/2019)    . acetaminophen (TYLENOL) 500 MG tablet Take 1,000 mg by mouth every 6 (six) hours as needed for moderate pain or headache.    [name] amoxicillin-clavulanate (AUGMENTIN) 875-125 MG tablet Take 1 tablet by mouth 2 (two) times daily. (Patient not taking: Reported on 05/28/2019) 20 tablet 0  . doxycycline (VIBRA-TABS) 100 MG tablet Take 1 tablet (100 mg total) by mouth 2 (two) times daily. (Patient not taking: Reported on 05/28/2019) 20 tablet 0  . Ensure Max Protein (ENSURE MAX PROTEIN) LIQD Take 330 mLs (11 oz total) by mouth 2 (two) times daily between meals. (Patient not taking: Reported on  05/28/2019)    . fexofenadine (ALLEGRA) 180 MG tablet Take 180 mg by mouth daily as needed for allergies or rhinitis.    . folic acid (FOLVITE) 1 MG tablet Take 1 tablet (1 mg total) by mouth daily. (Patient not taking: Reported on 05/28/2019)    . thiamine 100 MG tablet Take 1 tablet (100 mg total) by mouth daily. (Patient not taking: Reported on 05/28/2019)     No current facility-administered medications on file prior to visit.    ECOG PERFORMANCE STATUS:2  Today's Vitals   05/29/19 0853  BP: (!) 97/57  Pulse: 80  Resp: 18  Temp: (!) 94.6 F (34.8 C)  SpO2: 97%  Weight: 180 lb 11.2 oz (82 kg)  PainSc: 0-No pain   Body mass index is 24.51 kg/m.  Physical Exam Constitutional:      General: He is not in acute distress.    Appearance: He is ill-appearing.     Comments: Patient sits in the wheelchair.  HENT:     Head: Normocephalic and atraumatic.  Eyes:     General: No scleral icterus.    Pupils: Pupils are equal, round, and reactive to light.  Cardiovascular:     Rate and Rhythm: Normal rate and regular rhythm.     Heart sounds: Normal heart sounds.  Pulmonary:     Effort: Pulmonary effort is normal. No respiratory distress.     Breath sounds: No wheezing.     Comments: Good air entry the right lower lung base. Decreased breath sound left lower lung. Abdominal:     General: Bowel sounds are normal. There is no distension.     Palpations: Abdomen is soft. There is no mass.     Tenderness: There is no abdominal tenderness.  Musculoskeletal:        General: No deformity. Normal range of motion.     Cervical back: Normal range of motion and neck supple.  Skin:    General: Skin is warm and dry.     Findings: No erythema or rash.  Neurological:     Mental Status: He is alert and oriented to person, place,  and time. Mental status is at baseline.     Cranial Nerves: No cranial nerve deficit.     Coordination: Coordination normal.  Psychiatric:        Mood and Affect: Mood  normal.     LABORATORY DATA:  I have reviewed the data as listed Lab Results  Component Value Date   WBC 18.9 (H) 11/09/2018   HGB 11.0 (L) 11/09/2018   HCT 33.5 (L) 11/09/2018   MCV 89.8 11/09/2018   PLT 581 (H) 11/09/2018   Recent Labs    10/19/18 0841 11/02/18 1238 11/09/18 0833  NA 137 132* 131*  K 4.0 4.0 3.5  CL 104 95* 94*  CO2 _0 GLUCOSE 252* 425* 361*  BUN _1 CREATININE 0.61 0.84 0.77  CALCIUM 8.5* 8.5* 8.0*  GFRNONAA >60 >60 >60  GFRAA >60 >60 >60  PROT 6.0* 7.0 6.5  ALBUMIN 3.0* 2.6* 2.3*  AST 14* 11* 12*  ALT _2 ALKPHOS 69 75 63  BILITOT 0.5 0.9 0.8   Iron/TIBC/Ferritin/ %Sat No results found for: IRON, TIBC, FERRITIN, IRONPCTSAT    RADIOGRAPHIC STUDIES: I have personally reviewed the radiological images as listed and agreed with the findings in the report. DG Chest 1 View  Result Date: 03/14/2019 CLINICAL DATA:  Wheezing EXAM: CHEST  1 VIEW COMPARISON:  03/03/2019 FINDINGS: Borderline heart size.  CABG. Diffuse interstitial opacity with Kerley lines and left more than right pleural effusion. No pneumothorax. IMPRESSION: CHF. Electronically Signed   By: Monte Fantasia M.D.   On: 03/14/2019 05:47   DG Chest 2 View  Result Date: 05/14/2019 CLINICAL DATA:  Shortness of breath, lung cancer EXAM: CHEST - 2 VIEW COMPARISON:  05/01/2019 FINDINGS: Right dialysis catheter remains in place, unchanged. Prior CABG. Heart is normal size. Patchy opacities peripherally in the right lung and throughout the left lung. Left lung opacities have increased since prior study. This is concerning for pneumonia superimposed on areas of scarring. Small left pleural effusions suspected. No right effusion. IMPRESSION: Patchy peripheral airspace opacities throughout the right lung are similar to prior study. Increasing airspace disease throughout the left lung concerning for pneumonia. Small left effusion. Electronically Signed   By: Rolm Baptise M.D.   On:  05/14/2019 14:10   DG Chest 2 View  Result Date: 05/01/2019 CLINICAL DATA:  Shortness of breath EXAM: CHEST - 2 VIEW COMPARISON:  March 29, 2019 FINDINGS: There is a persistent small left pleural effusion. There is scarring and atelectasis throughout portions of the left mid and lower lung regions. There is new ill-defined airspace opacity in the right lower lobe. Heart is upper normal in size with pulmonary vascularity normal. Patient is status post coronary artery bypass grafting. Central catheter tip is in the superior vena cava. No pneumothorax. No bone lesions. IMPRESSION: New area of somewhat ill-defined opacity in the lateral right base, concerning for early pneumonia. Persistent fairly small left pleural effusion with atelectatic change in scarring left mid and lower lung zones. No new opacity on the left. Stable cardiac silhouette. Postoperative changes. Central catheter tip in superior vena cava without pneumothorax. Electronically Signed   By: Lowella Grip III M.D.   On: 05/01/2019 16:04   DG Chest 2 View  Result Date: 03/28/2019 CLINICAL DATA:  End-stage renal disease EXAM: CHEST - 2 VIEW COMPARISON:  03/26/2019 FINDINGS: Cardiac shadow is mildly enlarged but stable. Postsurgical changes and dialysis catheter are again seen. Bilateral pleural effusions are seen stable in  appearance from the prior exam. Mild vascular congestion is again noted but slightly improved. No focal confluent infiltrate is seen. IMPRESSION: Slight improvement in the degree of vascular congestion. Bilateral pleural effusions stable from the prior exam. Electronically Signed   By: Inez Catalina M.D.   On: 03/28/2019 14:22   DG Chest 2 View  Result Date: 03/26/2019 CLINICAL DATA:  Shortness of breath EXAM: CHEST - 2 VIEW COMPARISON:  March 15, 2019 FINDINGS: Heart is mildly enlarged with pulmonary venous hypertension. There is a pleural effusion on the left. There is consolidation in the medial left base. There is  mild interstitial edema present with atelectatic change in the left mid and lower lung zones. Patient is status post coronary artery bypass grafting. Central catheter tip is in the superior vena cava. No pneumothorax. No bone lesions. IMPRESSION: Cardiomegaly with pulmonary vascular congestion. Left pleural effusion. There is a degree of interstitial edema. Suspect a degree of congestive heart failure. Consolidation in the medial left base may represent alveolar edema or pneumonia. Both entities may be present concurrently. Central catheter tip is in the superior vena cava. Patient is status post coronary artery bypass grafting. Electronically Signed   By: Lowella Grip III M.D.   On: 03/26/2019 16:56   CT HEAD WO CONTRAST  Result Date: 03/05/2019 CLINICAL DATA:  Metastatic disease evaluation.  Lung cancer. EXAM: CT HEAD WITHOUT CONTRAST TECHNIQUE: Contiguous axial images were obtained from the base of the skull through the vertex without intravenous contrast. COMPARISON:  MRI head 09/20/2018 FINDINGS: Brain: Mild atrophy. Mild hypodensity in the white matter unchanged from the prior MRI. No acute infarct, hemorrhage, mass. No edema or midline shift. Vascular: Negative for hyperdense vessel Skull: Negative skull. Sinuses/Orbits: Mucosal edema and bony thickening right maxillary sinus. Chronic nasal bone fracture. Left cataract extraction. Other: None IMPRESSION: No acute abnormality and negative for metastatic disease on unenhanced CT. Atrophy and mild chronic microvascular ischemia. Electronically Signed   By: Franchot Gallo M.D.   On: 03/05/2019 15:28   CT CHEST WO CONTRAST  Result Date: 03/14/2019 CLINICAL DATA:  Respiratory failure. EXAM: CT CHEST WITHOUT CONTRAST TECHNIQUE: Multidetector CT imaging of the chest was performed following the standard protocol without IV contrast. COMPARISON:  Chest CT dated 01/22/2019. FINDINGS: Cardiovascular: Heart size appears stable. No pericardial effusion. Diffuse  coronary artery calcifications. Surgical changes of median sternotomy for presumed CABG. Mediastinum/Nodes: No mass or enlarged lymph nodes seen within the mediastinum. Esophagus appears normal. Trachea is unremarkable. Lungs/Pleura: New RIGHT pleural effusion, moderate to large in size. Associated compressive atelectasis within the RIGHT perihilar and lower lobe. New ill-defined consolidation within the LEFT lower lobe, suspected pneumonia. Probable additional rounded atelectasis within the lingula. Diffuse interstitial thickening indicating some degree of volume overload versus atypical infection. Upper Abdomen: Limited images of the upper abdomen are unremarkable. Musculoskeletal: No acute or suspicious osseous finding. IMPRESSION: 1. New ill-defined consolidation within the LEFT lower lobe, suspected pneumonia. 2. New moderate to large-sized RIGHT pleural effusion, with associated compressive atelectasis. 3. Diffuse interstitial thickening, most likely edema/fluid overload, less likely additional atypical infection. 4. Probable additional rounded atelectasis within the lingula. Aortic Atherosclerosis (ICD10-I70.0). Electronically Signed   By: Franki Cabot M.D.   On: 03/14/2019 19:47   US RENAL  Result Date: 03/10/2019 CLINICAL DATA:  64 year old presenting with acute renal failure. Evaluate for obstruction as a possible cause. EXAM: RENAL / URINARY TRACT ULTRASOUND COMPLETE COMPARISON:  Urinary tract ultrasound 03/04/2019. CT abdomen and pelvis 01/22/2019. FINDINGS: Right Kidney: Renal  measurements: Approximately 12.6 x 6.4 x 6.2 cm = volume: 264 mL. Mildly echogenic parenchyma. No hydronephrosis. Well-preserved cortex. No shadowing calculi. No focal parenchymal abnormality. Left Kidney: Renal measurements: Approximately 12.5 x 6.3 x 6.9 cm = volume: 283 mL. Mildly echogenic parenchyma. No hydronephrosis. Well-preserved cortex. No shadowing calculi. No focal parenchymal abnormality. Bladder: Normal for  degree of bladder distention. Other: None. IMPRESSION: 1. Mildly echogenic renal parenchyma indicative of medical renal disease. 2. Otherwise normal examination. Specifically, no evidence of urinary tract obstruction to account for the acute renal insufficiency. Electronically Signed   By: Evangeline Dakin M.D.   On: 03/10/2019 15:02   US RENAL  Result Date: 03/04/2019 CLINICAL DATA:  Acute kidney injury EXAM: RENAL / URINARY TRACT ULTRASOUND COMPLETE COMPARISON:  CT 01/22/2019 FINDINGS: Right Kidney: Renal measurements: 11.4 x 6 x 6 cm = volume: 215 mL . Echogenicity within normal limits. No mass or hydronephrosis visualized. Left Kidney: Renal measurements: 11.8 x 7.3 x 6.5 cm = volume: 291.8 mL. Echogenicity within normal limits. No mass or hydronephrosis visualized. Bladder: Appears normal for degree of bladder distention. Other: Incidental note made of gallstones and small effusions. Heterogenous prostate. IMPRESSION: Normal ultrasound appearance of the kidneys Electronically Signed   By: Donavan Foil M.D.   On: 03/04/2019 15:37   PERIPHERAL VASCULAR CATHETERIZATION  Result Date: 05/28/2019 See op note  PERIPHERAL VASCULAR CATHETERIZATION  Result Date: 03/14/2019 See op note  PERIPHERAL VASCULAR CATHETERIZATION  Result Date: 03/11/2019 See op note  DG Chest Port 1 View  Result Date: 03/29/2019 CLINICAL DATA:  Post thoracentesis. EXAM: PORTABLE CHEST 1 VIEW COMPARISON:  03/28/2019. FINDINGS: Post thoracentesis chest x-ray reveals no evidence of pneumothorax. Significant reduction in right-sided pleural effusion. Small left pleural effusion again noted. Bibasilar atelectasis/infiltrates again noted. Prior CABG. Right IJ line stable position. IMPRESSION: Post thoracentesis chest x-ray reveals no evidence of pneumothorax. Significant reduction in right-sided pleural effusion. Electronically Signed   By: Marcello Moores  Register   On: 03/29/2019 15:01   DG Chest Port 1 View  Result Date:  03/15/2019 CLINICAL DATA:  Status post thoracentesis. EXAM: PORTABLE CHEST 1 VIEW COMPARISON:  03/14/2019 FINDINGS: Interval decrease right pleural effusion. No evidence for pneumothorax. Bibasilar atelectasis/infiltrate again noted with persistent small left pleural effusion. The cardiopericardial silhouette is within normal limits for size. Pulmonary vascular congestion has decreased in the interval. Right IJ central line tip overlies the mid to distal SVC. The visualized bony structures of the thorax are intact. Telemetry leads overlie the chest. IMPRESSION: Interval decrease in right pleural effusion without evidence of pneumothorax. Decrease in pulmonary vascular congestion with persistent bibasilar atelectasis/infiltrate and small left pleural effusion. Electronically Signed   By: Misty Stanley M.D.   On: 03/15/2019 11:36   DG Chest Portable 1 View  Result Date: 03/03/2019 CLINICAL DATA:  Shortness of breath, since Monday worsening this morning around 1 a.m. EXAM: PORTABLE CHEST 1 VIEW COMPARISON:  12/19/2018 FINDINGS: Cardiomediastinal contours are stable following median sternotomy with persistent left-sided pleural effusion and pleural-parenchymal scarring. No new areas of consolidation or evidence of right-sided pleural effusion. No signs of acute bone process. IMPRESSION: Persistent left-sided pleural effusion and pleural-parenchymal scarring. Electronically Signed   By: Zetta Bills M.D.   On: 03/03/2019 13:59   ECHOCARDIOGRAM COMPLETE  Result Date: 03/15/2019    ECHOCARDIOGRAM REPORT   Patient Name:   ARGIE APPLEGATE Date of Exam: 03/15/2019 Medical Rec #:  [id number]       Height:       72.0  in Accession #:    [id number]      Weight:       201.4 lb Date of Birth:  February 23, 1955       BSA:          2.137 m Patient Age:    22 years        BP:           125/69 mmHg Patient Gender: M               HR:           83 bpm. Exam Location:  [facility] Procedure: 2D Echo, Cardiac Doppler and Color Doppler  Indications:     Respiratory distress  History:         Patient has prior history of Echocardiogram examinations, most                  recent 03/04/2019. Prior CABG; Risk Factors:Hypertension and                  Diabetes.  Sonographer:     Sherrie Sport RDCS (AE) Referring Phys:  [id number] Claiborne Billings A GRIFFITH Diagnosing Phys: Kathlyn Sacramento MD IMPRESSIONS  1. Left ventricular ejection fraction, by estimation, is 35 to 40%. The left ventricle has moderately decreased function. The left ventricle demonstrates regional wall motion abnormalities . The left ventricular internal cavity size was mildly dilated. There is mild left ventricular hypertrophy. Left ventricular diastolic parameters are consistent with Grade II diastolic dysfunction (pseudonormalization). There is moderate hypokinesis of the left ventricular, entire inferior wall.  2. Right ventricular systolic function is normal. The right ventricular size is normal. There is moderately elevated pulmonary artery systolic pressure. The estimated right ventricular systolic pressure is 41.9 mmHg.  3. Left atrial size was mildly dilated.  4. The mitral valve is normal in structure. Moderate mitral valve regurgitation. No evidence of mitral stenosis.  5. The aortic valve is abnormal. Aortic valve regurgitation is mild. Mild to moderate aortic valve sclerosis/calcification is present, without any evidence of aortic stenosis.  6. The inferior vena cava is normal in size with greater than 50% respiratory variability, suggesting right atrial pressure of 3 mmHg. FINDINGS  Left Ventricle: Left ventricular ejection fraction, by estimation, is 35 to 40%. The left ventricle has moderately decreased function. The left ventricle demonstrates regional wall motion abnormalities. Moderate hypokinesis of the left ventricular, entire inferior wall. The left ventricular internal cavity size was mildly dilated. There is mild left ventricular hypertrophy. Left ventricular diastolic parameters  are consistent with Grade II diastolic dysfunction (pseudonormalization). Right Ventricle: The right ventricular size is normal. No increase in right ventricular wall thickness. Right ventricular systolic function is normal. There is moderately elevated pulmonary artery systolic pressure. The tricuspid regurgitant velocity is 3.37 m/s, and with an assumed right atrial pressure of 3 mmHg, the estimated right ventricular systolic pressure is 37.9 mmHg. Left Atrium: Left atrial size was mildly dilated. Right Atrium: Right atrial size was normal in size. Pericardium: There is no evidence of pericardial effusion. Mitral Valve: The mitral valve is normal in structure. Normal mobility of the mitral valve leaflets. Moderate mitral valve regurgitation. No evidence of mitral valve stenosis. Tricuspid Valve: The tricuspid valve is normal in structure. Tricuspid valve regurgitation is mild . No evidence of tricuspid stenosis. Aortic Valve: The aortic valve is abnormal. Aortic valve regurgitation is mild. Mild to moderate aortic valve sclerosis/calcification is present, without any evidence of aortic stenosis. Aortic valve mean gradient measures 4.5  mmHg. Aortic valve peak gradient measures 8.4 mmHg. Aortic valve area, by VTI measures 2.54 cm. Pulmonic Valve: The pulmonic valve was normal in structure. Pulmonic valve regurgitation is not visualized. No evidence of pulmonic stenosis. Aorta: The aortic root is normal in size and structure. Venous: The inferior vena cava is normal in size with greater than 50% respiratory variability, suggesting right atrial pressure of 3 mmHg. IAS/Shunts: No atrial level shunt detected by color flow Doppler.  LEFT VENTRICLE PLAX 2D LVIDd:         5.34 cm      Diastology LVIDs:         4.38 cm      LV e' lateral:   5.77 cm/s LV PW:         1.00 cm      LV E/e' lateral: 22.4 LV IVS:        1.20 cm      LV e' medial:    5.00 cm/s LVOT diam:     2.00 cm      LV E/e' medial:  25.8 LV SV:         63 LV  SV Index:   30 LVOT Area:     3.14 cm  LV Volumes (MOD) LV vol d, MOD A2C: 227.0 ml LV vol d, MOD A4C: 148.0 ml LV vol s, MOD A2C: 142.0 ml LV vol s, MOD A4C: 110.0 ml LV SV MOD A2C:     85.0 ml LV SV MOD A4C:     148.0 ml LV SV MOD BP:      59.1 ml RIGHT VENTRICLE RV Basal diam:  4.14 cm RV S prime:     10.30 cm/s TAPSE (M-mode): 3.2 cm LEFT ATRIUM             Index       RIGHT ATRIUM           Index LA diam:        4.80 cm 2.25 cm/m  RA Area:     15.70 cm LA Vol (A2C):   68.8 ml 32.20 ml/m RA Volume:   40.00 ml  18.72 ml/m LA Vol (A4C):   77.9 ml 36.45 ml/m LA Biplane Vol: 75.1 ml 35.14 ml/m  AORTIC VALVE                   PULMONIC VALVE AV Area (Vmax):    2.41 cm    PV Vmax:        0.99 m/s AV Area (Vmean):   2.15 cm    PV Peak grad:   3.9 mmHg AV Area (VTI):     2.54 cm    RVOT Peak grad: 4 mmHg AV Vmax:           144.50 cm/s AV Vmean:          94.950 cm/s AV VTI:            0.249 m AV Peak Grad:      8.4 mmHg AV Mean Grad:      4.5 mmHg LVOT Vmax:         111.00 cm/s LVOT Vmean:        65.100 cm/s LVOT VTI:          0.201 m LVOT/AV VTI ratio: 0.81  AORTA Ao Root diam: 2.90 cm MITRAL VALVE                TRICUSPID VALVE MV Area (PHT): 5.13 cm  TR Peak grad:   45.4 mmHg MV Decel Time: 148 msec     TR Vmax:        337.00 cm/s MV E velocity: 129.00 cm/s MV A velocity: 68.40 cm/s   SHUNTS MV E/A ratio:  1.89         Systemic VTI:  0.20 m                             Systemic Diam: 2.00 cm Kathlyn Sacramento MD Electronically signed by Kathlyn Sacramento MD Signature Date/Time: 03/15/2019/11:27:06 AM    Final    ECHOCARDIOGRAM COMPLETE  Result Date: 03/04/2019    ECHOCARDIOGRAM REPORT   Patient Name:   RAHMEL NEDVED Date of Exam: 03/04/2019 Medical Rec #:  [id number]       Height:       72.0 in Accession #:    [id number]      Weight:       185.0 lb Date of Birth:  1955-04-07       BSA:          2.061 m Patient Age:    55 years        BP:           125/72 mmHg Patient Gender: M               HR:           119  bpm. Exam Location:  [facility] Procedure: 2D Echo, Color Doppler and Cardiac Doppler Indications:     I21.4 NSTEMI  History:         Patient has prior history of Echocardiogram examinations, most                  recent 08/24/2018. CAD, Prior CABG; Risk Factors:Hypertension,                  Dyslipidemia and Diabetes. Lung Cancer.  Sonographer:     Charmayne Sheer RDCS (AE) Referring Phys:  [place] Phys: Kathlyn Sacramento MD IMPRESSIONS  1. Left ventricular ejection fraction, by estimation, is 30 to 35%. The left ventricle has moderately decreased function. The left ventricle demonstrates global hypokinesis. Left ventricular diastolic parameters are indeterminate.  2. Right ventricular systolic function is normal. The right ventricular size is normal. Tricuspid regurgitation signal is inadequate for assessing PA pressure.  3. Left atrial size was mild to moderately dilated.  4. The mitral valve is normal in structure and function. Moderate mitral valve regurgitation. No evidence of mitral stenosis.  5. The aortic valve is normal in structure and function. Aortic valve regurgitation is trivial. Mild to moderate aortic valve sclerosis/calcification is present, without any evidence of aortic stenosis.  6. The inferior vena cava is normal in size with greater than 50% respiratory variability, suggesting right atrial pressure of 3 mmHg. FINDINGS  Left Ventricle: Left ventricular ejection fraction, by estimation, is 30 to 35%. The left ventricle has moderately decreased function. The left ventricle demonstrates global hypokinesis. The left ventricular internal cavity size was normal in size. There is no left ventricular hypertrophy. Left ventricular diastolic parameters are indeterminate. Right Ventricle: The right ventricular size is normal. No increase in right ventricular wall thickness. Right ventricular systolic function is normal. Tricuspid regurgitation signal is inadequate for assessing PA pressure.  Left Atrium: Left atrial size was mild to moderately dilated. Right Atrium: Right atrial size was normal in size. Pericardium: There is  no evidence of pericardial effusion. Mitral Valve: The mitral valve is normal in structure and function. Normal mobility of the mitral valve leaflets. Moderate mitral valve regurgitation. No evidence of mitral valve stenosis. MV peak gradient, 7.5 mmHg. The mean mitral valve gradient is 4.0  mmHg. Tricuspid Valve: The tricuspid valve is normal in structure. Tricuspid valve regurgitation is not demonstrated. No evidence of tricuspid stenosis. Aortic Valve: The aortic valve is normal in structure and function. Aortic valve regurgitation is trivial. Aortic regurgitation PHT measures 281 msec. Mild to moderate aortic valve sclerosis/calcification is present, without any evidence of aortic stenosis. Aortic valve mean gradient measures 5.0 mmHg. Aortic valve peak gradient measures 8.5 mmHg. Aortic valve area, by VTI measures 2.53 cm. Pulmonic Valve: The pulmonic valve was normal in structure. Pulmonic valve regurgitation is not visualized. No evidence of pulmonic stenosis. Aorta: The aortic root is normal in size and structure. Venous: The inferior vena cava is normal in size with greater than 50% respiratory variability, suggesting right atrial pressure of 3 mmHg. IAS/Shunts: No atrial level shunt detected by color flow Doppler.  LEFT VENTRICLE PLAX 2D LVIDd:         5.21 cm  Diastology LVIDs:         4.21 cm  LV e' lateral:   7.83 cm/s LV PW:         0.84 cm  LV E/e' lateral: 13.4 LV IVS:        0.89 cm  LV e' medial:    7.94 cm/s LVOT diam:     2.40 cm  LV E/e' medial:  13.2 LV SV:         54 LV SV Index:   26 LVOT Area:     4.52 cm  RIGHT VENTRICLE RV Basal diam:  3.08 cm LEFT ATRIUM             Index       RIGHT ATRIUM           Index LA diam:        4.70 cm 2.28 cm/m  RA Area:     14.10 cm LA Vol (A2C):   62.0 ml 30.08 ml/m RA Volume:   35.20 ml  17.08 ml/m LA Vol (A4C):    68.6 ml 33.29 ml/m LA Biplane Vol: 65.3 ml 31.68 ml/m  AORTIC VALVE                    PULMONIC VALVE AV Area (Vmax):    2.95 cm     PV Vmax:       1.10 m/s AV Area (Vmean):   2.86 cm     PV Vmean:      68.400 cm/s AV Area (VTI):     2.53 cm     PV VTI:        0.146 m AV Vmax:           146.00 cm/s  PV Peak grad:  4.8 mmHg AV Vmean:          103.000 cm/s PV Mean grad:  2.0 mmHg AV VTI:            0.213 m AV Peak Grad:      8.5 mmHg AV Mean Grad:      5.0 mmHg LVOT Vmax:         95.30 cm/s LVOT Vmean:        65.200 cm/s LVOT VTI:          0.119  m LVOT/AV VTI ratio: 0.56 AI PHT:            281 msec  AORTA Ao Root diam: 3.00 cm MITRAL VALVE MV Area (PHT): 3.92 cm     SHUNTS MV Peak grad:  7.5 mmHg     Systemic VTI:  0.12 m MV Mean grad:  4.0 mmHg     Systemic Diam: 2.40 cm MV Vmax:       1.37 m/s MV Vmean:      92.7 cm/s MV Decel Time: 194 msec MV E velocity: 104.80 cm/s Kathlyn Sacramento MD Electronically signed by Kathlyn Sacramento MD Signature Date/Time: 03/04/2019/2:46:43 PM    Final    [place] RT UPPER EXTREM LTD SOFT TISSUE NON VASCULAR  Result Date: 03/08/2019 CLINICAL DATA:  Swelling and bruising of the right upper extremity. Possible hematoma. EXAM: ULTRASOUND right UPPER EXTREMITY LIMITED TECHNIQUE: Ultrasound examination of the upper extremity soft tissues was performed in the area of clinical concern. COMPARISON:  None. FINDINGS: Diffuse subcutaneous soft tissue swelling/edema and streaky fluid. No discrete fluid collection or hematoma. The underlying musculature is grossly normal. IMPRESSION: Diffuse subcutaneous soft tissue swelling/edema/streaky fluid but no discrete abscess or hematoma. Electronically Signed   By: Marijo Sanes M.D.   On: 03/08/2019 19:13   US THORACENTESIS ASP PLEURAL SPACE W/IMG GUIDE  Result Date: 03/29/2019 INDICATION: Pleural effusion. EXAM: ULTRASOUND GUIDED RIGHT THORACENTESIS MEDICATIONS: None. COMPLICATIONS: None immediate. PROCEDURE: An ultrasound guided thoracentesis was  thoroughly discussed with the patient and questions answered. The benefits, risks, alternatives and complications were also discussed. The patient understands and wishes to proceed with the procedure. Written consent was obtained. Ultrasound was performed to localize and mark an adequate pocket of fluid in the right chest. The area was then prepped and draped in the normal sterile fashion. 1% Lidocaine was used for local anesthesia. Under ultrasound guidance a 6 French catheter was introduced. Thoracentesis was performed. The catheter was removed and a dressing applied. FINDINGS: A total of approximately 1 L of yellow fluid was removed. Samples were sent to the laboratory as requested by the clinical team. IMPRESSION: Successful ultrasound guided right thoracentesis yielding 1 L of pleural fluid. Electronically Signed   By: Marcello Moores  Register   On: 03/29/2019 15:04   US THORACENTESIS ASP PLEURAL SPACE W/IMG GUIDE  Result Date: 03/15/2019 INDICATION: History of lung cancer with recurrent right pleural effusion. Request for diagnostic and therapeutic thoracentesis. EXAM: ULTRASOUND GUIDED RIGHT THORACENTESIS MEDICATIONS: 1% lidocaine 10 mL COMPLICATIONS: None immediate. PROCEDURE: An ultrasound guided thoracentesis was thoroughly discussed with the patient and questions answered. The benefits, risks, alternatives and complications were also discussed. The patient understands and wishes to proceed with the procedure. Written consent was obtained. Ultrasound was performed to localize and mark an adequate pocket of fluid in the right chest. The area was then prepped and draped in the normal sterile fashion. 1% Lidocaine was used for local anesthesia. Under ultrasound guidance a 6 Fr Safe-T-Centesis catheter was introduced. Thoracentesis was performed. The catheter was removed and a dressing applied. FINDINGS: A total of approximately 2.7 L of clear yellow fluid was removed. Samples were sent to the laboratory as  requested by the clinical team. IMPRESSION: Successful ultrasound guided right thoracentesis yielding 2.7 L of pleural fluid. No pneumothorax on post-procedure chest x-ray. Read by: Gareth Eagle, PA-C Electronically Signed   By: Sandi Mariscal M.D.   On: 03/15/2019 12:44     ASSESSMENT & PLAN:  1. Malignant neoplasm of unspecified part of unspecified bronchus  or lung ([place])   2. Encounter for antineoplastic immunotherapy   3. Stage 4 chronic kidney disease ([place])   4. 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 BRAF targeted therapy was discontinued due to patient's recent acute illness including acute CHF, DKA, renal failure on hemodialysis.  Off hemodialysis. Labs reviewed and discussed with patient. Proceed with cycle 2 Keytruda. Discussed with patient that I plan to repeat another image after 4 cycles of Keytruda..  Stage IV CKD, he is not on hemodialysis anymore.  Dialysis catheter has been taken out. Malnutrition, continue follow-up with nutritionist.  Anemia, likely secondary to CKD.  Monitor hemoglobin.  Erythropoietin is contraindicated due to active cancer.   Hemoglobin is at 10.3, stable.  Diabetes, on insulin.  Glucose levels are elevated at 234. Elevated alkaline phosphatase, possible due to immunotherapy.  Continue to monitor.  Levels are stable.  Follow-up in 3 weeks.   Earlie Server, MD, PhD Hematology Oncology [place] [facility] 05/29/2019

## 2019-05-29 NOTE — Patient Instructions (Addendum)
Continue weighing daily and call for an overnight weight gain of > 2 pounds or a weekly weight gain of >5 pounds.   Take the furosemide 20mg  as needed for above weight gain, swelling or worsening shortness of breath.   [facility] June 9th at 10am

## 2019-05-29 NOTE — Progress Notes (Signed)
Patient has trouble sleeping.

## 2019-05-29 NOTE — Progress Notes (Signed)
Patient ID: Craig Koch, male    DOB: 19-Jun-1955, 64 y.o.   MRN: [id number]  HPI  Craig Koch is a 64 y/o male with a history of lung cancer, CAE, DM, hyperlipidemia, HTN, anxiety, pleural effusions, recent tobacco use and chronic heart failure.   Echo report from 03/15/19 reviewed and showed an EF of 35-40% along with moderately elevated PA pressure of 48.4 mmHg and moderate Craig.  Admitted 03/26/19 due to HF exacerbation. Had right thoracentesis for right pleural effusion. PT/OT eval done. Initially given IV lasix and then transitioned to oral diuretics. Dialysis done while admitted. Discharged after 6 days. Admitted 03/03/19 due to acute respiratory failure due to acute HF. Vascular surgery, nephrology, cardiology, oncology and palliative care consults obtained. Initially needed bipap and IV lasix. Thoracentesis done with removal of 2.7L of fluid.   Discharged after 16 days.   He presents today for a follow-up visit with a chief complaint of moderate shortness of breath upon moderate exertion. He says that he can walk ~ 30-40 feet before getting short of breath and then he has a hard time getting his breathing back under control. He has associated fatigue, cough, light-headedness, weakness, easy bruising and occasional PND. He denies any abdominal distention, palpitations, pedal edema, chest pain or weight gain.   Has recently had his dialysis catheter removed. Has had a pleurx in the past.   Past Medical History:  Diagnosis Date  . Anxiety   . Arthritis   . CHF (congestive heart failure) ([place])   . Coronary artery disease   . Diabetes mellitus   . Dyslipidemia   . Hx of CABG   . Hyperlipidemia   . Hypertension   . Malignant neoplasm of unspecified part of unspecified bronchus or lung ([place]) 08/2018   Immunotherapy  . Pleural effusion    Past Surgical History:  Procedure Laterality Date  . CATARACT EXTRACTION Left 09/2015  . CHEST TUBE INSERTION Left 10/01/2018   Procedure:  INSERTION PLEURAL DRAINAGE CATHETER;  Surgeon: [name], MD;  Location: [facility];  Service: Thoracic;  Laterality: Left;  . CORONARY ARTERY BYPASS GRAFT  2004   (CABG with LIMA to the  LAD, SVG to OM2/OM3, SVG  to diag  . DIALYSIS/PERMA CATHETER INSERTION N/A 03/14/2019   Procedure: DIALYSIS/PERMA CATHETER INSERTION;  Surgeon: Algernon Huxley, MD;  Location: [facility];  Service: Cardiovascular;  Laterality: N/A;  . DIALYSIS/PERMA CATHETER REMOVAL N/A 05/28/2019   Procedure: DIALYSIS/PERMA CATHETER REMOVAL;  Surgeon: Katha Cabal, MD;  Location: [facility];  Service: Cardiovascular;  Laterality: N/A;  . Left ankle surgery     repair of fracture  . Right lower leg surgery     rod  . TEMPORARY DIALYSIS CATHETER N/A 03/11/2019   Procedure: TEMPORARY DIALYSIS CATHETER;  Surgeon: Algernon Huxley, MD;  Location: [facility];  Service: Cardiovascular;  Laterality: N/A;   Family History  Problem Relation Age of Onset  . Dementia Mother   . Heart disease Father   . Colon cancer Neg Hx   . Prostate cancer Neg Hx   . Diabetes Neg Hx    Social History   Tobacco Use  . Smoking status: Former Smoker    Packs/day: 0.20    Years: 45.00    Pack years: 9.00    Types: Cigarettes    Quit date: 03/05/2019    Years since quitting: 0.2  . Smokeless tobacco: Former Systems developer    Types: Snuff  Substance  Use Topics  . Alcohol use: Not Currently    Alcohol/week: 6.0 standard drinks    Types: 6 Cans of beer per week    Comment: occ, average 6 pack in a week   Allergies  Allergen Reactions  . Lipitor [Atorvastatin Calcium] Other (See Comments)    Aches.  Tolerated crestor.    Prior to Admission medications   Medication Sig Start Date End Date Taking? Authorizing Provider  acetaminophen (TYLENOL) 325 MG tablet Take 2 tablets (650 mg total) by mouth every 6 (six) hours as needed for mild pain or fever. 03/19/19  Yes Nicole Kindred A, DO  albuterol (VENTOLIN HFA) 108 (90 Base)  MCG/ACT inhaler Inhale 2 puffs into the lungs every 6 (six) hours as needed for wheezing or shortness of breath. 04/01/19 05/29/19 Yes Wyvonnia Dusky, MD  amiodarone (PACERONE) 200 MG tablet Take 1 tablet (200 mg total) by mouth daily. 04/23/19 05/29/19 Yes Tonia Ghent, MD  apixaban (ELIQUIS) 5 MG TABS tablet Take 1 tablet (5 mg total) by mouth 2 (two) times daily. 05/10/19  Yes Marrianne Mood D, PA-C  clopidogrel (PLAVIX) 75 MG tablet Take 75 mg by mouth daily.   Yes [provider]  dextromethorphan-guaiFENesin (MUCINEX DM) 30-600 MG 12hr tablet Take 1 tablet by mouth 2 (two) times daily. 03/19/19  Yes Nicole Kindred A, DO  DULoxetine (CYMBALTA) 30 MG capsule Take 1 capsule (30 mg total) by mouth daily. 04/01/19 05/29/19 Yes Wyvonnia Dusky, MD  ezetimibe (ZETIA) 10 MG tablet Take 1 tablet (10 mg total) by mouth daily. 05/10/19 08/08/19 Yes Visser, Jacquelyn D, PA-C  ferrous sulfate 325 (65 FE) MG tablet Take 1 tablet (325 mg total) by mouth 2 (two) times daily with a meal. 04/23/19 05/29/19 Yes Tonia Ghent, MD  folic acid (FOLVITE) 1 MG tablet Take 1 tablet (1 mg total) by mouth daily. Patient taking differently: Take 400 mcg by mouth daily.  03/20/19  Yes Nicole Kindred A, DO  HUMALOG 100 UNIT/ML injection Inject 15-20 Units into the skin 3 (three) times daily as needed for high blood sugar. 04/17/19  Yes [provider]  insulin detemir (LEVEMIR) 100 UNIT/ML injection Inject 0.15-0.18 mLs (15-18 Units total) into the skin at bedtime. Patient taking differently: Inject 20 Units into the skin at bedtime.  05/14/19  Yes Tonia Ghent, MD  metoprolol succinate (TOPROL-XL) 25 MG 24 hr tablet Take 0.5 tablets (12.5 mg total) by mouth daily. 04/15/19  Yes Tonia Ghent, MD  Multiple Vitamin (MULTIVITAMIN WITH MINERALS) TABS tablet Take 1 tablet by mouth at bedtime.   Yes [provider]  promethazine (PHENERGAN) 25 MG tablet Take 1 tablet (25 mg total) by mouth  every 6 (six) hours as needed for nausea or vomiting. 04/08/19  Yes Earlie Server, MD  rosuvastatin (CRESTOR) 10 MG tablet TAKE 1 TABLET (10 MG TOTAL) BY MOUTH DAILY. Patient taking differently: Take 10 mg by mouth daily.  01/07/19  Yes Tonia Ghent, MD  acetaminophen (TYLENOL) 500 MG tablet Take 1,000 mg by mouth every 6 (six) hours as needed for moderate pain or headache.    [provider]  amoxicillin-clavulanate (AUGMENTIN) 875-125 MG tablet Take 1 tablet by mouth 2 (two) times daily. Patient not taking: Reported on 05/28/2019 05/14/19   Tonia Ghent, MD  doxycycline (VIBRA-TABS) 100 MG tablet Take 1 tablet (100 mg total) by mouth 2 (two) times daily. Patient not taking: Reported on 05/28/2019 05/14/19   Tonia Ghent, MD  Ensure Max Protein (ENSURE MAX PROTEIN) LIQD Take 330 mLs (11 oz total) by mouth 2 (two) times daily between meals. Patient not taking: Reported on 05/28/2019 03/19/19   Ezekiel Slocumb, DO  fexofenadine (ALLEGRA) 180 MG tablet Take 180 mg by mouth daily as needed for allergies or rhinitis.    [provider]  insulin aspart (NOVOLOG) 100 UNIT/ML injection Inject 0-8 Units into the skin as directed. Take 0 units if CBG 70-150 Take 1 units if CBG 151-200 Take 2 units if CBG 201-250 Take 3 units if CBG 251-300 Take 4 units if CBG 301-350 Take 6 units if CBG 351-400 If CBG > 400, give 8 units and call MD 04/15/19   Tonia Ghent, MD  multivitamin (RENA-VIT) TABS tablet Take 1 tablet by mouth at bedtime. 03/19/19   Ezekiel Slocumb, DO  polyethylene glycol (MIRALAX / GLYCOLAX) 17 g packet Take 17 g by mouth daily. 03/20/19   Ezekiel Slocumb, DO  simethicone (MYLICON) 80 MG chewable tablet Chew 1 tablet (80 mg total) by mouth 4 (four) times daily as needed for flatulence. 03/19/19   Nicole Kindred A, DO  thiamine 100 MG tablet Take 1 tablet (100 mg total) by mouth daily. Patient not taking: Reported on 05/28/2019 03/20/19   Ezekiel Slocumb, DO      Review of Systems  Constitutional: Positive for fatigue. Negative for appetite change.  HENT: Negative for congestion and postnasal drip.   Eyes: Negative.   Respiratory: Positive for cough and shortness of breath (easily).   Cardiovascular: Negative for chest pain, palpitations and leg swelling.  Gastrointestinal: Negative for abdominal distention and abdominal pain.  Endocrine: Negative.   Genitourinary: Negative.   Musculoskeletal: Positive for back pain. Negative for neck pain.  Skin: Negative.   Allergic/Immunologic: Negative.   Neurological: Positive for weakness and light-headedness. Negative for dizziness.  Hematological: Negative for adenopathy. Bruises/bleeds easily.  Psychiatric/Behavioral: Negative for dysphoric mood and sleep disturbance (sleeping on 2 pillow). The patient is not nervous/anxious.    Vitals:   05/29/19 1416  BP: 115/67  Pulse: 80  Resp: 18  SpO2: 100%  Weight: 181 lb 6 oz (82.3 kg)  Height: 6' (1.829 m)   Wt Readings from Last 3 Encounters:  05/29/19 181 lb 6 oz (82.3 kg)  05/29/19 180 lb 11.2 oz (82 kg)  05/24/19 181 lb 8 oz (82.3 kg)   Lab Results  Component Value Date   CREATININE 1.99 (H) 05/29/2019   CREATININE 1.82 (H) 05/24/2019   CREATININE 2.28 (H) 05/20/2019    Physical Exam Vitals and nursing note reviewed.  Constitutional:      Appearance: Normal appearance.  HENT:     Head: Normocephalic and atraumatic.  Cardiovascular:     Rate and Rhythm: Normal rate and regular rhythm.  Pulmonary:     Effort: Pulmonary effort is normal. No respiratory distress.     Breath sounds: No wheezing or rales.  Abdominal:     General: There is no distension.     Palpations: Abdomen is soft.  Musculoskeletal:        General: No tenderness.     Cervical back: Neck supple.     Right lower leg: No edema.     Left lower leg: No edema.  Skin:    General: Skin is warm and dry.  Neurological:     General: No focal deficit present.      Mental Status: He is alert and oriented to person, place, and time.  Psychiatric:        Mood and Affect: Mood normal.        Behavior: Behavior normal.        Thought Content: Thought content normal.   Assessment & Plan:  1: Chronic heart failure with reduced ejection fraction- - NYHA class III - euvolemic today - weighing daily and he was instructed to call for an overnight weight gain of > 2 pounds or a weekly weight gain of >5 pounds - weight down 9 pounds since last visit here 6 weeks ago - not adding salt to his food and the importance of following a 2000mg  sodium diet was reviewed - saw cardiology Mickle Plumb) 05/24/19 - will begin furosemide 20mg  as needed for above weight gain, swelling or shortness of breath; advised patient that if he needs to take it more than once/ week, to call so that we can check lab work; otherwise, he will be getting lab work done in ~ 3 weeks - did not add potassium with it as his potassium today was 5.0 - BNP 03/26/19 was 475.0 - PharmD reconciled medications with the patient  2: HTN- - BP looks good today - saw PCP Damita Dunnings) 05/14/19 - BMP 05/29/19 reviewed and showed sodium 136, potassium 5.0, creatinine 1.99 and GFR 34  3: DM with CKD- - A1c 05/14/19 was 7.2% - no longer doing dialysis with removal of catheter - saw nephrology Holley Raring) 05/20/19 - nonfasting glucose at home today was 235  4: Stage IV lung cancer-  - saw oncology Tasia Catchings) 05/29/19 - palliative care visit done 04/18/19 - says that he hasn't smoked in the last month - appointment with pulmonology Lanney Gins) was scheduled for 06/12/19 as a follow-up appointment   Medication list reviewed.   Return in 3 months or sooner for any questions/problems before then.

## 2019-05-29 NOTE — Progress Notes (Signed)
[facility] - PHARMACIST COUNSELING NOTE  ADHERENCE ASSESSMENT  Adherence strategy: Wife present with patient at visit today. Patient's wife fills pillbox weekly for patient.    Do you ever forget to take your medication? [] Yes (1) [x] No (0)  Do you ever skip doses due to side effects? [] Yes (1) [x] No (0)  Do you have trouble affording your medicines? [] Yes (1) [x] No (0)  Are you ever unable to pick up your medication due to transportation difficulties? [] Yes (1) [x] No (0)  Do you ever stop taking your medications because you [place]'t believe they are helping? [] Yes (1) [x] No (0)  Total score 0   Recommendations given to patient about increasing adherence: None needed  Guideline-Directed Medical Therapy/Evidence Based Medicine  ACE/ARB/ARNI: None Beta Blocker: Metoprolol succinate 12.5 mg daily Aldosterone Antagonist: None Diuretic: None    SUBJECTIVE  HPI: Patient is a 64 y/o M with PMH as below who presents to CHF clinic for follow-up. Patient was admitted 3/23 - 3/29 with acute hypoxic respiratory failure secondary to heart failure exacerbation, stage IV lung cancer, and left pleural effusion. Admission complicated by acute renal failure and patient was initiated on dialysis. Renal function has since recovered and patient is now off of dialysis.  Past Medical History:  Diagnosis Date  . Anxiety   . Arthritis   . CHF (congestive heart failure) ([place])   . Coronary artery disease   . Diabetes mellitus   . Dyslipidemia   . Hx of CABG   . Hyperlipidemia   . Hypertension   . Malignant neoplasm of unspecified part of unspecified bronchus or lung ([place]) 08/2018   Immunotherapy  . Pleural effusion      OBJECTIVE   Vital signs: HR 80, BP 115/67, weight (pounds) 181 ECHO: Date 03/15/19, EF 35-40%, notes: LV internal cavity mildly dilated, mild LVH, grade II diastolic dysfunction. Moderately elevated PA systolic pressure. LA mildly  dilated. Moderate MVR, mild AVR. Moderate aortic valve sclerosis without evidence of stenosis.   BMP Latest Ref Rng & Units 05/29/2019 05/24/2019 05/20/2019  Glucose 70 - 99 mg/dL 234(H) 84 233(H)  BUN 8 - 23 mg/dL 34(H) 32(H) 42(H)  Creatinine 0.61 - 1.24 mg/dL 1.99(H) 1.82(H) 2.28(H)  BUN/Creat Ratio 10 - 24 - 18 -  Sodium 135 - 145 mmol/L 136 140 136  Potassium 3.5 - 5.1 mmol/L 5.0 4.2 4.7  Chloride 98 - 111 mmol/L 104 102 102  CO2 22 - 32 mmol/L 26 23 24   Calcium 8.9 - 10.3 mg/dL 8.5(L) 9.0 8.8(L)    ASSESSMENT  Patient presents to visit today accompanied by wife. Wife manages medications for patient at home. Patient denies missed doses / adverse effects of therapy. Patient complains of shortness of breath today. He has history of malignant pleural effusion which has been managed with intermittent thoracentesis procedures.  PLAN  1). CHF -No medications for fluid management at this time -GDMT limited by soft blood pressures and renal function -Dialysis was discontinued -Continue daily weights -Continue low sodium diet -Discussed with provider - plan to start furosemide 20 mg PRN with close follow-up, referral to pulmonology  2). Hypertension -Normotensive today -Antihypertensive regimen includes metoprolol succinate 12.5 mg daily  3). Atrial Fibrillation -Rhythm control with amiodarone 200 mg daily -Rate control with metoprolol succinate 12.5 mg daily -Anticoagulation with apixaban 5 mg BID -Denies signs/symptoms of bleeding  4). T2DM -Glucose on CMP this morning was 234 -HgbA1c was 7.2% on 05/14/19 -Antihyperglycemic regimen includes Humalog on a  sliding scale TIDAC + levemir 20 units every evening  5). Hyperlipidemia -Rosuvastatin 10 mg daily, ezetimibe 10 mg daily -Lipid panel 05/24/19 with HDL 41, LDL 31, TG 47  6). Acute Renal Failure -Following with nephrology -Dialysis discontinued in view of renal recovery -Labs today with Scr 1.99, GFR 34, K 5  7). Stage IV  lung cancer -Follows with oncology. Patient had visit earlier today -Receiving treatment with Keytruda. Patient received dose earlier today   8). Coronary artery disease -History of CABG -Clopidogrel 75 mg daily -Rosuvastatin, ezetimibe -Knows to avoid aspirin containing products   Time spent: 25 minutes  [place] Resident 05/29/2019 11:51 AM    Current Outpatient Medications:  .  acetaminophen (TYLENOL) 325 MG tablet, Take 2 tablets (650 mg total) by mouth every 6 (six) hours as needed for mild pain or fever. (Patient not taking: Reported on 05/28/2019), Disp:  , Rfl:  .  acetaminophen (TYLENOL) 500 MG tablet, Take 1,000 mg by mouth every 6 (six) hours as needed for moderate pain or headache., Disp: , Rfl:  .  albuterol (VENTOLIN HFA) 108 (90 Base) MCG/ACT inhaler, Inhale 2 puffs into the lungs every 6 (six) hours as needed for wheezing or shortness of breath., Disp: 18 g, Rfl: 0 .  amiodarone (PACERONE) 200 MG tablet, Take 1 tablet (200 mg total) by mouth daily., Disp: 90 tablet, Rfl: 0 .  amoxicillin-clavulanate (AUGMENTIN) 875-125 MG tablet, Take 1 tablet by mouth 2 (two) times daily. (Patient not taking: Reported on 05/28/2019), Disp: 20 tablet, Rfl: 0 .  apixaban (ELIQUIS) 5 MG TABS tablet, Take 1 tablet (5 mg total) by mouth 2 (two) times daily., Disp: 60 tablet, Rfl: 5 .  dextromethorphan-guaiFENesin (MUCINEX DM) 30-600 MG 12hr tablet, Take 1 tablet by mouth 2 (two) times daily., Disp:  , Rfl:  .  doxycycline (VIBRA-TABS) 100 MG tablet, Take 1 tablet (100 mg total) by mouth 2 (two) times daily. (Patient not taking: Reported on 05/28/2019), Disp: 20 tablet, Rfl: 0 .  DULoxetine (CYMBALTA) 30 MG capsule, Take 1 capsule (30 mg total) by mouth daily., Disp: 30 capsule, Rfl: 0 .  Ensure Max Protein (ENSURE MAX PROTEIN) LIQD, Take 330 mLs (11 oz total) by mouth 2 (two) times daily between meals. (Patient not taking: Reported on 05/28/2019), Disp:  , Rfl:  .  ezetimibe  (ZETIA) 10 MG tablet, Take 1 tablet (10 mg total) by mouth daily., Disp: 30 tablet, Rfl: 5 .  ferrous sulfate 325 (65 FE) MG tablet, Take 1 tablet (325 mg total) by mouth 2 (two) times daily with a meal., Disp: 180 tablet, Rfl: 0 .  fexofenadine (ALLEGRA) 180 MG tablet, Take 180 mg by mouth daily as needed for allergies or rhinitis., Disp: , Rfl:  .  folic acid (FOLVITE) 1 MG tablet, Take 1 tablet (1 mg total) by mouth daily. (Patient not taking: Reported on 05/28/2019), Disp:  , Rfl:  .  HUMALOG 100 UNIT/ML injection, Inject 15-20 Units into the skin 3 (three) times daily as needed for high blood sugar., Disp: , Rfl:  .  insulin aspart (NOVOLOG) 100 UNIT/ML injection, Inject 0-8 Units into the skin as directed. Take 0 units if CBG 70-150 Take 1 units if CBG 151-200 Take 2 units if CBG 201-250 Take 3 units if CBG 251-300 Take 4 units if CBG 301-350 Take 6 units if CBG 351-400 If CBG > 400, give 8 units and call MD, Disp: 10 mL, Rfl:  .  insulin detemir (LEVEMIR) 100  UNIT/ML injection, Inject 0.15-0.18 mLs (15-18 Units total) into the skin at bedtime. (Patient taking differently: Inject 20 Units into the skin at bedtime. ), Disp: 10 mL, Rfl:  .  metoprolol succinate (TOPROL-XL) 25 MG 24 hr tablet, Take 0.5 tablets (12.5 mg total) by mouth daily., Disp: 45 tablet, Rfl: 1 .  Multiple Vitamin (MULTIVITAMIN WITH MINERALS) TABS tablet, Take 1 tablet by mouth at bedtime., Disp: , Rfl:  .  multivitamin (RENA-VIT) TABS tablet, Take 1 tablet by mouth at bedtime., Disp: , Rfl: 0 .  polyethylene glycol (MIRALAX / GLYCOLAX) 17 g packet, Take 17 g by mouth daily., Disp: 14 each, Rfl: 0 .  promethazine (PHENERGAN) 25 MG tablet, Take 1 tablet (25 mg total) by mouth every 6 (six) hours as needed for nausea or vomiting., Disp: 60 tablet, Rfl: 0 .  rosuvastatin (CRESTOR) 10 MG tablet, TAKE 1 TABLET (10 MG TOTAL) BY MOUTH DAILY. (Patient taking differently: Take 10 mg by mouth daily. ), Disp: 90 tablet, Rfl: 3 .   simethicone (MYLICON) 80 MG chewable tablet, Chew 1 tablet (80 mg total) by mouth 4 (four) times daily as needed for flatulence., Disp: 30 tablet, Rfl: 0 .  thiamine 100 MG tablet, Take 1 tablet (100 mg total) by mouth daily. (Patient not taking: Reported on 05/28/2019), Disp:  , Rfl:    COUNSELING POINTS/CLINICAL PEARLS  Metoprolol Succinate (Goal: 200 mg once daily) Warn patient to avoid activities requiring mental alertness or coordination until drug effects are realized, as drug may cause dizziness. Tell patient planning major surgery with anesthesia to alert physician that drug is being used, as drug impairs ability of heart to respond to reflex adrenergic stimuli. Drug may cause diarrhea, fatigue, headache, or depression. Advise diabetic patient to carefully monitor blood glucose as drug may mask symptoms of hypoglycemia. Patient should take extended-release tablet with or immediately following meals. Counsel patient against sudden discontinuation of drug, as this may precipitate hypertension, angina, or myocardial infarction. In the event of a missed dose, counsel patient to skip the missed dose and maintain a regular dosing schedule. Furosemide  Drug causes sun-sensitivity. Advise patient to use sunscreen and avoid tanning beds. Patient should avoid activities requiring coordination until drug effects are realized, as drug may cause dizziness, vertigo, or blurred vision. This drug may cause hyperglycemia, hyperuricemia, constipation, diarrhea, loss of appetite, nausea, vomiting, purpuric disorder, cramps, spasticity, asthenia, headache, paresthesia, or scaling eczema. Instruct patient to report unusual bleeding/bruising or signs/symptoms of hypotension, infection, pancreatitis, or ototoxicity (tinnitus, hearing impairment). Advise patient to report signs/symptoms of a severe skin reactions (flu-like symptoms, spreading red rash, or skin/mucous membrane blistering) or erythema  multiforme. Instruct patient to eat high-potassium foods during drug therapy, as directed by healthcare professional.  Patient should not drink alcohol while taking this drug.  DRUGS TO AVOID IN HEART FAILURE  Drug or Class Mechanism  Analgesics . NSAIDs . COX-2 inhibitors . Glucocorticoids  Sodium and water retention, increased systemic vascular resistance, decreased response to diuretics   Diabetes Medications . Metformin . Thiazolidinediones o Rosiglitazone (Avandia) o Pioglitazone (Actos) . DPP4 Inhibitors o Saxagliptin (Onglyza) o Sitagliptin (Januvia)   Lactic acidosis Possible calcium channel blockade   Unknown  Antiarrhythmics . Class I  o Flecainide o Disopyramide . Class III o Sotalol . Other o Dronedarone  Negative inotrope, proarrhythmic   Proarrhythmic, beta blockade  Negative inotrope  Antihypertensives . Alpha Blockers o Doxazosin . Calcium Channel Blockers o Diltiazem o Verapamil o Nifedipine . Central Alpha Adrenergics o  Moxonidine . Peripheral Vasodilators o Minoxidil  Increases renin and aldosterone  Negative inotrope    Possible sympathetic withdrawal  Unknown  Anti-infective . Itraconazole . Amphotericin B  Negative inotrope Unknown  Hematologic . Anagrelide . Cilostazol   Possible inhibition of PD IV Inhibition of PD III causing arrhythmias  Neurologic/Psychiatric . Stimulants . Anti-Seizure Drugs o Carbamazepine o Pregabalin . Antidepressants o Tricyclics o Citalopram . Parkinsons o Bromocriptine o Pergolide o Pramipexole . Antipsychotics o Clozapine . Antimigraine o Ergotamine o Methysergide . Appetite suppressants . Bipolar o Lithium  Peripheral alpha and beta agonist activity  Negative inotrope and chronotrope Calcium channel blockade  Negative inotrope, proarrhythmic Dose-dependent QT prolongation  Excessive serotonin activity/valvular damage Excessive serotonin activity/valvular  damage Unknown  IgE mediated hypersensitivy, calcium channel blockade  Excessive serotonin activity/valvular damage Excessive serotonin activity/valvular damage Valvular damage  Direct myofibrillar degeneration, adrenergic stimulation  Antimalarials . Chloroquine . Hydroxychloroquine Intracellular inhibition of lysosomal enzymes  Urologic Agents . Alpha Blockers o Doxazosin o Prazosin o Tamsulosin o Terazosin  Increased renin and aldosterone  Adapted from Page RL, et al. "Drugs That May Cause or Exacerbate Heart Failure: A Scientific Statement from the [place]." Circulation 2016; 382:N05-L97. DOI: 10.1161/CIR.0000000000000426   MEDICATION ADHERENCES TIPS AND STRATEGIES 1. Taking medication as prescribed improves patient outcomes in heart failure (reduces hospitalizations, improves symptoms, increases survival) 2. Side effects of medications can be managed by decreasing doses, switching agents, stopping drugs, or adding additional therapy. Please let someone in the [facility] know if you have having bothersome side effects so we can modify your regimen. Do not alter your medication regimen without talking to [place].  3. Medication reminders can help patients remember to take drugs on time. If you are missing or forgetting doses you can try linking behaviors, using pill boxes, or an electronic reminder like an alarm on your phone or an app. Some people can also get automated phone calls as medication reminders.

## 2019-05-31 ENCOUNTER — Telehealth: Payer: Self-pay

## 2019-05-31 NOTE — Telephone Encounter (Signed)
VM left for patient to check in and to offer to schedule a follow up visit with NP. Call back information provided

## 2019-06-06 ENCOUNTER — Other Ambulatory Visit: Payer: Self-pay | Admitting: Family

## 2019-06-12 DIAGNOSIS — J449 Chronic obstructive pulmonary disease, unspecified: Secondary | ICD-10-CM | POA: Diagnosis not present

## 2019-06-12 DIAGNOSIS — C3490 Malignant neoplasm of unspecified part of unspecified bronchus or lung: Secondary | ICD-10-CM | POA: Diagnosis not present

## 2019-06-14 ENCOUNTER — Encounter: Payer: Self-pay | Admitting: Family Medicine

## 2019-06-14 ENCOUNTER — Other Ambulatory Visit: Payer: Self-pay

## 2019-06-14 ENCOUNTER — Ambulatory Visit (INDEPENDENT_AMBULATORY_CARE_PROVIDER_SITE_OTHER): Payer: Medicare Other | Admitting: Family Medicine

## 2019-06-14 ENCOUNTER — Telehealth: Payer: Self-pay

## 2019-06-14 VITALS — BP 108/56 | HR 65 | Temp 96.6°F | Ht 72.0 in | Wt 193.4 lb

## 2019-06-14 DIAGNOSIS — M7989 Other specified soft tissue disorders: Secondary | ICD-10-CM

## 2019-06-14 DIAGNOSIS — S51811A Laceration without foreign body of right forearm, initial encounter: Secondary | ICD-10-CM

## 2019-06-14 DIAGNOSIS — R609 Edema, unspecified: Secondary | ICD-10-CM | POA: Diagnosis not present

## 2019-06-14 MED ORDER — FUROSEMIDE 20 MG PO TABS: 20.0000 mg | ORAL_TABLET | Freq: Every day | ORAL | Status: DC

## 2019-06-14 MED ORDER — INSULIN DETEMIR 100 UNIT/ML ~~LOC~~ SOLN: 20.0000 [IU] | Freq: Every day | SUBCUTANEOUS | Status: DC

## 2019-06-14 NOTE — Telephone Encounter (Signed)
Mrs Designer, industrial/product (DPR signed) had called Dr Holley Raring, nephrologist and he is out of office and Mrs Tippen was advised to contact PCP. Pt was given furosemide 20 mg taking one daily prn by Darylene Price NP on 05/29/19. Pt seen at CHF clinic on 05/29/19 and pt hospitalized 03/26/19 - 04/01/19 for acute hypoxic respiratory failure secondary to CHF exacerbation, stg 4 lung CA and pleural effusion. Pt said Dr Damita Dunnings 05/14/19 for 3 mth FU DM. Both lower legs are swollen but no redness or pain; pt has swelling and redness on top of feet with some pain. No CP or dizziness but has SOB due tolung CA. Pt is not having any difficulty in walking. Dr Damita Dunnings said at pts discretion can contact cardiology or see Dr Damita Dunnings in office today at 3:00 pm pt need lab testing. Pt will be here at 2:45 today for ck in to see Dr Damita Dunnings. Pt has no covid symptoms, no travel and no known exposure to + covid.

## 2019-06-14 NOTE — Patient Instructions (Addendum)
Go to the lab on the way out.   If you have mychart we'll likely use that to update you.     Take 2 furosemide tabs tomorrow AM (Saturday). If your weight is down and swelling is better on Sunday AM, then cut back 1 tab a day.  If not still take 2 tab a day.   Continue with 1-2 tabs until Monday when you see the kidney clinic.    Take care.  Glad to see you.

## 2019-06-14 NOTE — Progress Notes (Signed)
This visit occurred during the SARS-CoV-2 public health emergency.  Safety protocols were in place, including screening questions prior to the visit, additional usage of staff PPE, and extensive cleaning of exam room while observing appropriate contact time as indicated for disinfecting solutions.  Weight is up, appetite is up and he likely has some fluid retention.  BLE edema noted.  He has renal f/u pending, Dr. Holley Raring.  Taking lasix 20mg  daily for the last 4 days.  When he takes lasix, he urinates more.  No blood in urine.  He has SOB with exertion at baseline but not at rest.  This isn't worse.    He is still on eliquis.  He scraped his R arm early yesterday AM, small bleeding skin tear.  See exam below.  Meds, vitals, and allergies reviewed.   ROS: Per HPI unless specifically indicated in ROS section   Chronically ill appearing male in wheelchair but not in apparent distress. Speech at baseline ncat Neck supple, no LA rrr Dec BS at [facility] but no wheeze or other focal decrease in breath sounds. abd soft, not ttp 2+ BLE edema.   Small subcentimeter skin tear on right forearm, on extensor side.  Small amount of oozing blood controlled with pressure, covered with nonstick bandage and Neosporin along with gauze and then Coban.

## 2019-06-14 NOTE — Telephone Encounter (Signed)
Noted. Thanks.

## 2019-06-15 LAB — CBC WITH DIFFERENTIAL/PLATELET
Absolute Monocytes: 762 cells/uL (ref 200–950)
Basophils Absolute: 78 cells/uL (ref 0–200)
Basophils Relative: 1.3 %
Eosinophils Absolute: 264 cells/uL (ref 15–500)
Eosinophils Relative: 4.4 %
HCT: 36.4 % — ABNORMAL LOW (ref 38.5–50.0)
Hemoglobin: 11.4 g/dL — ABNORMAL LOW (ref 13.2–17.1)
Lymphs Abs: 552 cells/uL — ABNORMAL LOW (ref 850–3900)
MCH: 30.2 pg (ref 27.0–33.0)
MCHC: 31.3 g/dL — ABNORMAL LOW (ref 32.0–36.0)
MCV: 96.6 fL (ref 80.0–100.0)
MPV: 12.3 fL (ref 7.5–12.5)
Monocytes Relative: 12.7 %
Neutro Abs: 4344 cells/uL (ref 1500–7800)
Neutrophils Relative %: 72.4 %
Platelets: 263 10*3/uL (ref 140–400)
RBC: 3.77 10*6/uL — ABNORMAL LOW (ref 4.20–5.80)
RDW: 15.2 % — ABNORMAL HIGH (ref 11.0–15.0)
Total Lymphocyte: 9.2 %
WBC: 6 10*3/uL (ref 3.8–10.8)

## 2019-06-15 LAB — BASIC METABOLIC PANEL
BUN/Creatinine Ratio: 20 (calc) (ref 6–22)
BUN: 47 mg/dL — ABNORMAL HIGH (ref 7–25)
CO2: 24 mmol/L (ref 20–32)
Calcium: 8.7 mg/dL (ref 8.6–10.3)
Chloride: 103 mmol/L (ref 98–110)
Creat: 2.36 mg/dL — ABNORMAL HIGH (ref 0.70–1.25)
Glucose, Bld: 260 mg/dL — ABNORMAL HIGH (ref 65–99)
Potassium: 4.4 mmol/L (ref 3.5–5.3)
Sodium: 138 mmol/L (ref 135–146)

## 2019-06-16 NOTE — Assessment & Plan Note (Signed)
No indication for suture.  Bleeding controlled easily with small amount of pressure from bandage.  Does not appear infected.  Change dressing daily.  Update me as needed.  Routine instructions given to patient he agreed.

## 2019-06-16 NOTE — Assessment & Plan Note (Signed)
See notes on labs.  He has renal follow-up pending.  Plan to take 2 furosemide tabs tomorrow AM (Saturday). If weight is down and swelling is better on Sunday AM, then cut back 1 tab a day.  If not still take 2 tabs a day.   Continue with 1-2 tabs until Monday when he goes to renal clinic. He agrees with plan.

## 2019-06-17 DIAGNOSIS — D631 Anemia in chronic kidney disease: Secondary | ICD-10-CM | POA: Diagnosis not present

## 2019-06-17 DIAGNOSIS — N2581 Secondary hyperparathyroidism of renal origin: Secondary | ICD-10-CM | POA: Diagnosis not present

## 2019-06-17 DIAGNOSIS — N179 Acute kidney failure, unspecified: Secondary | ICD-10-CM | POA: Diagnosis not present

## 2019-06-18 ENCOUNTER — Inpatient Hospital Stay: Payer: Medicare Other | Attending: Hospice and Palliative Medicine | Admitting: Hospice and Palliative Medicine

## 2019-06-18 DIAGNOSIS — R748 Abnormal levels of other serum enzymes: Secondary | ICD-10-CM | POA: Insufficient documentation

## 2019-06-18 DIAGNOSIS — I5043 Acute on chronic combined systolic (congestive) and diastolic (congestive) heart failure: Secondary | ICD-10-CM | POA: Diagnosis not present

## 2019-06-18 DIAGNOSIS — C3490 Malignant neoplasm of unspecified part of unspecified bronchus or lung: Secondary | ICD-10-CM | POA: Diagnosis not present

## 2019-06-18 DIAGNOSIS — N186 End stage renal disease: Secondary | ICD-10-CM | POA: Insufficient documentation

## 2019-06-18 DIAGNOSIS — J189 Pneumonia, unspecified organism: Secondary | ICD-10-CM | POA: Diagnosis not present

## 2019-06-18 DIAGNOSIS — D649 Anemia, unspecified: Secondary | ICD-10-CM | POA: Insufficient documentation

## 2019-06-18 DIAGNOSIS — Z87891 Personal history of nicotine dependence: Secondary | ICD-10-CM | POA: Insufficient documentation

## 2019-06-18 DIAGNOSIS — Z515 Encounter for palliative care: Secondary | ICD-10-CM

## 2019-06-18 DIAGNOSIS — E1136 Type 2 diabetes mellitus with diabetic cataract: Secondary | ICD-10-CM | POA: Insufficient documentation

## 2019-06-18 DIAGNOSIS — Z5112 Encounter for antineoplastic immunotherapy: Secondary | ICD-10-CM | POA: Insufficient documentation

## 2019-06-18 DIAGNOSIS — R946 Abnormal results of thyroid function studies: Secondary | ICD-10-CM | POA: Insufficient documentation

## 2019-06-18 DIAGNOSIS — J440 Chronic obstructive pulmonary disease with acute lower respiratory infection: Secondary | ICD-10-CM | POA: Insufficient documentation

## 2019-06-18 DIAGNOSIS — J910 Malignant pleural effusion: Secondary | ICD-10-CM | POA: Insufficient documentation

## 2019-06-18 DIAGNOSIS — E1122 Type 2 diabetes mellitus with diabetic chronic kidney disease: Secondary | ICD-10-CM | POA: Insufficient documentation

## 2019-06-18 NOTE — Progress Notes (Signed)
Virtual Visit via Telephone Note  I connected with Craig Koch on 06/18/19 at 10:30 AM EDT by telephone and verified that I am speaking with the correct person using two identifiers.   I discussed the limitations, risks, security and privacy concerns of performing an evaluation and management service by telephone and the availability of in person appointments. I also discussed with the patient that there may be a patient responsible charge related to this service. The patient expressed understanding and agreed to proceed.   History of Present Illness: Craig Mitton Gerringeris a 64 y.o.malewith multiple medical problems including stage IV adenocarcinoma of the lung with history of left sided malignant pleural effusion requiring Pleurx(later removed). Patient has opted not to pursue chemotherapy and has instead been treated on immunotherapy. Patient has had persistent shortness of breath. CTA of the chest on 11/02/2018 revealed marked increase in tumor encasing the entire left lung with new diffuse increased interstitial thickening concerning for disease progression and lymphangitic spread of disease.Repeat CT of the chest and abdomen on 01/22/2019 revealed significant interval decreaseoftumor burden on the lung. Patient was admitted to the hospital on 03/03/2019 with shortness of breath and weakness. He was found to have non-STEMI and DKA. He was treated with oral antibiotics for community-acquired pneumonia.  Patient was referred to palliative care to help address goals and manage ongoing symptoms.   Observations/Objective: Patient was unable to participate in virtual visit so I called him instead.  Patient reports that he is doing about the same.  He denies any significant changes or concerns.  He continues to endorse chronic shortness of breath.  He says he saw pulmonary and was ordered a nebulizer but says that there was some insurance issue that was a barrier for him receiving the device.   He asked for assistance with ordering a new nebulizer.  I reached out to [employer], Andree Coss, who agreed to help order a nebulizer machine for the patient.  Assessment and Plan: Stage IV adenocarcinoma of the lung -on pembrolizumab.  Has follow-up scheduled next week with Dr. Tasia Catchings  DME: Nebulizer machine  Follow Up Instructions: Follow-up MyChart visit in 1 to 2 months   I discussed the assessment and treatment plan with the patient. The patient was provided an opportunity to ask questions and all were answered. The patient agreed with the plan and demonstrated an understanding of the instructions.   The patient was advised to call back or seek an in-person evaluation if the symptoms worsen or if the condition fails to improve as anticipated.  I provided 5 minutes of non-face-to-face time during this encounter.   Irean Hong, NP

## 2019-06-19 ENCOUNTER — Inpatient Hospital Stay (HOSPITAL_BASED_OUTPATIENT_CLINIC_OR_DEPARTMENT_OTHER): Payer: Medicare Other | Admitting: Oncology

## 2019-06-19 ENCOUNTER — Inpatient Hospital Stay: Payer: Medicare Other

## 2019-06-19 ENCOUNTER — Encounter: Payer: Self-pay | Admitting: Oncology

## 2019-06-19 ENCOUNTER — Other Ambulatory Visit: Payer: Self-pay

## 2019-06-19 ENCOUNTER — Ambulatory Visit
Admission: RE | Admit: 2019-06-19 | Discharge: 2019-06-19 | Disposition: A | Discharge status: Home / Self Care | Payer: Medicare Other | Attending: Oncology | Admitting: Oncology

## 2019-06-19 ENCOUNTER — Ambulatory Visit
Admission: RE | Admit: 2019-06-19 | Discharge: 2019-06-19 | Disposition: A | Discharge status: Home / Self Care | Payer: Medicare Other | Source: Ambulatory Visit | Attending: Oncology | Admitting: Oncology

## 2019-06-19 VITALS — BP 124/72 | HR 84 | Resp 18

## 2019-06-19 VITALS — BP 111/62 | HR 81 | Temp 97.6°F | Resp 20 | Wt 188.2 lb

## 2019-06-19 DIAGNOSIS — C3490 Malignant neoplasm of unspecified part of unspecified bronchus or lung: Secondary | ICD-10-CM | POA: Insufficient documentation

## 2019-06-19 DIAGNOSIS — R946 Abnormal results of thyroid function studies: Secondary | ICD-10-CM | POA: Diagnosis not present

## 2019-06-19 DIAGNOSIS — E1122 Type 2 diabetes mellitus with diabetic chronic kidney disease: Secondary | ICD-10-CM | POA: Diagnosis not present

## 2019-06-19 DIAGNOSIS — R0602 Shortness of breath: Secondary | ICD-10-CM | POA: Insufficient documentation

## 2019-06-19 DIAGNOSIS — N184 Chronic kidney disease, stage 4 (severe): Secondary | ICD-10-CM | POA: Diagnosis not present

## 2019-06-19 DIAGNOSIS — D649 Anemia, unspecified: Secondary | ICD-10-CM | POA: Diagnosis not present

## 2019-06-19 DIAGNOSIS — J440 Chronic obstructive pulmonary disease with acute lower respiratory infection: Secondary | ICD-10-CM | POA: Diagnosis not present

## 2019-06-19 DIAGNOSIS — Z5112 Encounter for antineoplastic immunotherapy: Secondary | ICD-10-CM

## 2019-06-19 DIAGNOSIS — R748 Abnormal levels of other serum enzymes: Secondary | ICD-10-CM | POA: Diagnosis not present

## 2019-06-19 DIAGNOSIS — J910 Malignant pleural effusion: Secondary | ICD-10-CM | POA: Diagnosis not present

## 2019-06-19 DIAGNOSIS — N186 End stage renal disease: Secondary | ICD-10-CM | POA: Diagnosis not present

## 2019-06-19 DIAGNOSIS — J90 Pleural effusion, not elsewhere classified: Secondary | ICD-10-CM | POA: Diagnosis not present

## 2019-06-19 DIAGNOSIS — Z87891 Personal history of nicotine dependence: Secondary | ICD-10-CM | POA: Diagnosis not present

## 2019-06-19 DIAGNOSIS — E1136 Type 2 diabetes mellitus with diabetic cataract: Secondary | ICD-10-CM | POA: Diagnosis not present

## 2019-06-19 LAB — CBC WITH DIFFERENTIAL/PLATELET
Abs Immature Granulocytes: 0.02 10*3/uL (ref 0.00–0.07)
Basophils Absolute: 0.1 10*3/uL (ref 0.0–0.1)
Basophils Relative: 1 %
Eosinophils Absolute: 0.3 10*3/uL (ref 0.0–0.5)
Eosinophils Relative: 4 %
HCT: 34.8 % — ABNORMAL LOW (ref 39.0–52.0)
Hemoglobin: 11.5 g/dL — ABNORMAL LOW (ref 13.0–17.0)
Immature Granulocytes: 0 %
Lymphocytes Relative: 7 %
Lymphs Abs: 0.5 10*3/uL — ABNORMAL LOW (ref 0.7–4.0)
MCH: 30.9 pg (ref 26.0–34.0)
MCHC: 33 g/dL (ref 30.0–36.0)
MCV: 93.5 fL (ref 80.0–100.0)
Monocytes Absolute: 0.7 10*3/uL (ref 0.1–1.0)
Monocytes Relative: 9 %
Neutro Abs: 5.4 10*3/uL (ref 1.7–7.7)
Neutrophils Relative %: 79 %
Platelets: 253 10*3/uL (ref 150–400)
RBC: 3.72 MIL/uL — ABNORMAL LOW (ref 4.22–5.81)
RDW: 18.5 % — ABNORMAL HIGH (ref 11.5–15.5)
WBC: 7 10*3/uL (ref 4.0–10.5)
nRBC: 0 % (ref 0.0–0.2)

## 2019-06-19 LAB — COMPREHENSIVE METABOLIC PANEL
ALT: 21 U/L (ref 0–44)
AST: 20 U/L (ref 15–41)
Albumin: 2.9 g/dL — ABNORMAL LOW (ref 3.5–5.0)
Alkaline Phosphatase: 283 U/L — ABNORMAL HIGH (ref 38–126)
Anion gap: 11 (ref 5–15)
BUN: 38 mg/dL — ABNORMAL HIGH (ref 8–23)
CO2: 27 mmol/L (ref 22–32)
Calcium: 8.4 mg/dL — ABNORMAL LOW (ref 8.9–10.3)
Chloride: 99 mmol/L (ref 98–111)
Creatinine, Ser: 1.65 mg/dL — ABNORMAL HIGH (ref 0.61–1.24)
GFR calc Af Amer: 50 mL/min — ABNORMAL LOW (ref >60)
GFR calc non Af Amer: 43 mL/min — ABNORMAL LOW (ref >60)
Glucose, Bld: 142 mg/dL — ABNORMAL HIGH (ref 70–99)
Potassium: 4 mmol/L (ref 3.5–5.1)
Sodium: 137 mmol/L (ref 135–145)
Total Bilirubin: 0.8 mg/dL (ref 0.3–1.2)
Total Protein: 7.3 g/dL (ref 6.5–8.1)

## 2019-06-19 LAB — TSH: TSH: 6.725 u[IU]/mL — ABNORMAL HIGH (ref 0.350–4.500)

## 2019-06-19 LAB — T4, FREE: Free T4: 1.33 ng/dL — ABNORMAL HIGH (ref 0.61–1.12)

## 2019-06-19 MED ORDER — SODIUM CHLORIDE 0.9 % IV SOLN
200.0000 mg | Freq: Once | INTRAVENOUS | Status: AC
  Administered 2019-06-19: 200 mg via INTRAVENOUS
  Filled 2019-06-19: qty 8

## 2019-06-19 MED ORDER — SODIUM CHLORIDE 0.9 % IV SOLN
Freq: Once | INTRAVENOUS | Status: AC
  Filled 2019-06-19: qty 250

## 2019-06-19 NOTE — Progress Notes (Signed)
Patient stated that he becomes short winded when it comes to walking far or short distances.

## 2019-06-19 NOTE — Addendum Note (Signed)
Addended by: Earlie Server on: 06/19/2019 03:07 PM   Modules accepted: Orders

## 2019-06-19 NOTE — Progress Notes (Signed)
Hematology/Oncologyfollow up  note [facility] [phone] [phone]   Patient Care Team: Tonia Ghent, MD as PCP - General (Family Medicine) Thelma Comp, [place] (Optometry) Telford Nab, RN as Registered Nurse  REFERRING PROVIDER: Tonia Ghent, MD  CHIEF COMPLAINTS/REASON FOR VISIT:  Follow-up for metastatic lung adenocarcinoma  HISTORY OF PRESENTING ILLNESS:   Craig Koch is a  64 y.o.  male with PMH listed below was seen in consultation at the request of  Tonia Ghent, MD  for evaluation of malignant pleural effusion. Patient was recently admitted to [facility] due to large amount of pleural effusion, acute hypoxic respiratory failure. Patient reports feeling shortness of breath and dyspnea on exertion for several weeks and presented to emergency room.  A chest x-ray 08/23/2018 showed a large amount of left pleural effusion 08/24/2018 CT chest abdomen pelvis confirmed large left pleural effusion with near complete collapse of the left lower lobe and extensive volume loss in the lingula and a portion of the left upper lobe. No acute intra-abdominal process.  Cholelithiasis, diverticulitis, coronary atherosclerosis post CABG.  Bilateral renal cortical scarring.  Aortic atherosclerosis.  #08/24/2018 ultrasound guided diagnostic and therapeutic thoracentesis removed 1.8 L of pleural fluid. Patient subsequently felt better and was discharged to follow-up with pulmonology. 09/04/2018 patient followed up with Dr.Aleskerov chest x-ray showed recurrent moderate left pleural effusion.  Patient underwent ultrasound-guided left thoracentesis again and it drained 3 L of fluid. Pleural fluid cytology positive for adenocarcinoma. Patient was referred to me for further evaluation and management.  # PET eft sided pleural hypermetabolic activity  consistent with malignant pleural effusion. Left upper and lower lobe low-level hypermetabolic him  corresponding to areas of airspace and groundglass opacity.  This was felt to be secondary to atelectasis.  Underlying left upper lobe primary bronchogenic carcinoma cannot be excluded. Medial left upper lobe area more focal hypermetabolic area could represent a pleural disease or a site of small left upper lobe primary. No evidence of extra thoracic hypermetabolic metastatic disease.  Left sided hydro-pneumothorax within the pleural air component being new since the prior CT. Cholelithiasis.  #MRI brain showed punctate focus of enhancement within the right parietal lobe cortex suspicious for possible metastasis. Molecular study showed BRAF V600 mutation. 11/16/2018 started on dabrafenib 150 mg twice daily and trametinib 2 mg daily.  He had good response  #Patient was admitted from 03/03/2019-03/19/2019 due to DKA due to uncontrolled diabetes, acute decompensation of CHF left lower lobe pneumonia right pleural effusion status post thoracentesis.  Fluid study was consistent with transudate. AKI secondary to ATN, on hemodialysis Tuesday Thursday and Saturday, atrial fibrillation with RVR, paroxysmal.  Patient was discharged on Eliquis and amiodarone. Patient was readmitted from 03/26/2019-04/01/2019 due to CHF exacerbation, right pleural effusion recurrent.  Status post diagnostic and therapeutic thoracentesis.  Cytology was negative for malignant cells.   INTERVAL HISTORY Craig Koch is a 64 y.o. male who has above history reviewed by me today presents for follow-up of lung cancer Problems and complaints are listed below:  Patient was accompanied by wife today. He continues to have chronic shortness of breath with exertion.  Breathing status has not changed significantly. Fatigue is slightly better.  Nephrology has increased Lasix to 40 mg daily.  Review of Systems  Constitutional: Positive for fatigue. Negative for appetite change, chills, diaphoresis, fever and unexpected weight change.  HENT:    Negative for hearing loss, lump/mass, nosebleeds, sore throat and voice change.   Eyes: Negative for  eye problems and icterus.  Respiratory: Positive for shortness of breath. Negative for chest tightness, cough, hemoptysis and wheezing.   Cardiovascular: Negative for chest pain and leg swelling.  Gastrointestinal: Negative for abdominal distention, abdominal pain, blood in stool, diarrhea, nausea and rectal pain.  Endocrine: Negative for hot flashes.  Genitourinary: Negative for bladder incontinence, difficulty urinating, dysuria, frequency, hematuria and nocturia.   Musculoskeletal: Negative for arthralgias, back pain, flank pain, gait problem and myalgias.  Skin: Negative for itching and rash.  Neurological: Negative for dizziness, gait problem, headaches, light-headedness, numbness and seizures.  Hematological: Negative for adenopathy. Bruises/bleeds easily.  Psychiatric/Behavioral: Negative for confusion and decreased concentration. The patient is not nervous/anxious.     MEDICAL HISTORY:  Past Medical History:  Diagnosis Date  . Anxiety   . Arthritis   . CHF (congestive heart failure) ([place])   . Coronary artery disease   . Diabetes mellitus   . Dyslipidemia   . Hx of CABG   . Hyperlipidemia   . Hypertension   . Malignant neoplasm of unspecified part of unspecified bronchus or lung ([place]) 08/2018   Immunotherapy  . Pleural effusion     SURGICAL HISTORY: Past Surgical History:  Procedure Laterality Date  . CATARACT EXTRACTION Left 09/2015  . CHEST TUBE INSERTION Left 10/01/2018   Procedure: INSERTION PLEURAL DRAINAGE CATHETER;  Surgeon: [name], MD;  Location: [facility] ORS;  Service: Thoracic;  Laterality: Left;  . CORONARY ARTERY BYPASS GRAFT  2004   (CABG with LIMA to the  LAD, SVG to OM2/OM3, SVG  to diag  . DIALYSIS/PERMA CATHETER INSERTION N/A 03/14/2019   Procedure: DIALYSIS/PERMA CATHETER INSERTION;  Surgeon: Algernon Huxley, MD;  Location: [place] [facility];   Service: Cardiovascular;  Laterality: N/A;  . DIALYSIS/PERMA CATHETER REMOVAL N/A 05/28/2019   Procedure: DIALYSIS/PERMA CATHETER REMOVAL;  Surgeon: Katha Cabal, MD;  Location: [place] [facility];  Service: Cardiovascular;  Laterality: N/A;  . Left ankle surgery     repair of fracture  . Right lower leg surgery     rod  . TEMPORARY DIALYSIS CATHETER N/A 03/11/2019   Procedure: TEMPORARY DIALYSIS CATHETER;  Surgeon: Algernon Huxley, MD;  Location: [place] [facility];  Service: Cardiovascular;  Laterality: N/A;   SOCIAL HISTORY: Social History   Socioeconomic History  . Marital status: Married    Spouse name: Not on file  . Number of children: Not on file  . Years of education: Not on file  . Highest education level: Not on file  Occupational History  . Not on file  Tobacco Use  . Smoking status: Former Smoker    Packs/day: 0.20    Years: 45.00    Pack years: 9.00    Types: Cigarettes    Quit date: 03/05/2019    Years since quitting: 0.2  . Smokeless tobacco: Former Systems developer    Types: Snuff  Vaping Use  . Vaping Use: Never used  Substance and Sexual Activity  . Alcohol use: Not Currently    Alcohol/week: 6.0 standard drinks    Types: 6 Cans of beer per week    Comment: occ, average 6 pack in a week  . Drug use: No  . Sexual activity: Not Currently  Other Topics Concern  . Not on file  Social History Narrative   On disability 2009 after prev injuries and CAD.     Married 1976   2 kids, 4 grandkids.    Social Determinants of Health   Financial Resource Strain:   .  Difficulty of Paying Living Expenses:   Food Insecurity:   . Worried About Charity fundraiser in the Last Year:   . Arboriculturist in the Last Year:   Transportation Needs:   . Film/video editor (Medical):   [name] Lack of Transportation (Non-Medical):   Physical Activity:   . Days of Exercise per Week:   . Minutes of Exercise per Session:   Stress:   . Feeling of Stress :   Social Connections:     . Frequency of Communication with Friends and Family:   . Frequency of Social Gatherings with Friends and Family:   . Attends Religious Services:   . Active Member of Clubs or Organizations:   . Attends Archivist Meetings:   [name] Marital Status:   Intimate Partner Violence:   . Fear of Current or Ex-Partner:   . Emotionally Abused:   [name] Physically Abused:   . Sexually Abused:      FAMILY HISTORY: Family History  Problem Relation Age of Onset  . Dementia Mother   . Heart disease Father   . Colon cancer Neg Hx   . Prostate cancer Neg Hx   . Diabetes Neg Hx    ALLERGIES:  Allergies  Allergen Reactions  . Lipitor [Atorvastatin Calcium] Other (See Comments)    Aches.  Tolerated crestor.      MEDICATIONS: PHYSICAL EXAMINATION: Current Outpatient Medications on File Prior to Visit  Medication Sig Dispense Refill  . albuterol (VENTOLIN HFA) 108 (90 Base) MCG/ACT inhaler Inhale 2 puffs into the lungs every 6 (six) hours as needed for wheezing or shortness of breath. 18 g 0  . apixaban (ELIQUIS) 5 MG TABS tablet Take 1 tablet (5 mg total) by mouth 2 (two) times daily. 60 tablet 5  . clopidogrel (PLAVIX) 75 MG tablet Take 75 mg by mouth daily.    [name] dextromethorphan-guaiFENesin (MUCINEX DM) 30-600 MG 12hr tablet Take 1 tablet by mouth 2 (two) times daily.    . DULoxetine (CYMBALTA) 30 MG capsule Take 1 capsule (30 mg total) by mouth daily. 30 capsule 0  . ezetimibe (ZETIA) 10 MG tablet Take 1 tablet (10 mg total) by mouth daily. 30 tablet 5  . ferrous sulfate 325 (65 FE) MG tablet Take 1 tablet (325 mg total) by mouth 2 (two) times daily with a meal. 767 tablet 0  . folic acid (FOLVITE) 1 MG tablet Take 1 tablet (1 mg total) by mouth daily. (Patient taking differently: Take 400 mcg by mouth daily. )    . furosemide (LASIX) 20 MG tablet Take 1-2 tablets (20-40 mg total) by mouth daily. As needed    . HUMALOG 100 UNIT/ML injection Inject 15-20 Units into the skin 3 (three) times  daily as needed for high blood sugar.    . insulin aspart (NOVOLOG) 100 UNIT/ML injection Inject 0-8 Units into the skin as directed. Take 0 units if CBG 70-150 Take 1 units if CBG 151-200 Take 2 units if CBG 201-250 Take 3 units if CBG 251-300 Take 4 units if CBG 301-350 Take 6 units if CBG 351-400 If CBG > 400, give 8 units and call MD 10 mL   . insulin detemir (LEVEMIR) 100 UNIT/ML injection Inject 0.2 mLs (20 Units total) into the skin at bedtime.    . metoprolol succinate (TOPROL-XL) 25 MG 24 hr tablet Take 0.5 tablets (12.5 mg total) by mouth daily. 45 tablet 1  . Multiple Vitamin (MULTIVITAMIN WITH MINERALS) TABS tablet Take  1 tablet by mouth at bedtime.    . multivitamin (RENA-VIT) TABS tablet Take 1 tablet by mouth at bedtime.  0  . rosuvastatin (CRESTOR) 10 MG tablet TAKE 1 TABLET (10 MG TOTAL) BY MOUTH DAILY. 90 tablet 3  . amiodarone (PACERONE) 200 MG tablet Take 1 tablet (200 mg total) by mouth daily. 90 tablet 0   No current facility-administered medications on file prior to visit.    ECOG PERFORMANCE STATUS:2  Today's Vitals   06/19/19 0914 06/19/19 0929  BP: 111/62   Pulse: 81   Resp: 20   Temp: 97.6 F (36.4 C)   SpO2: 100%   Weight: 188 lb 3.2 oz (85.4 kg)   PainSc:  0-No pain   Body mass index is 25.52 kg/m.  Physical Exam Constitutional:      General: He is not in acute distress.    Comments: Patient sits in the wheelchair.  HENT:     Head: Normocephalic and atraumatic.  Eyes:     General: No scleral icterus.    Pupils: Pupils are equal, round, and reactive to light.  Cardiovascular:     Rate and Rhythm: Normal rate and regular rhythm.     Heart sounds: Normal heart sounds.  Pulmonary:     Effort: Pulmonary effort is normal. No respiratory distress.     Breath sounds: No wheezing.     Comments: Good air entry the right lower lung base. Decreased breath sound left lower lung. Abdominal:     General: Bowel sounds are normal. There is no distension.      Palpations: Abdomen is soft. There is no mass.     Tenderness: There is no abdominal tenderness.  Musculoskeletal:        General: Swelling present. No deformity. Normal range of motion.     Cervical back: Normal range of motion and neck supple.  Skin:    General: Skin is warm and dry.     Findings: No erythema or rash.  Neurological:     Mental Status: He is alert and oriented to person, place, and time. Mental status is at baseline.     Cranial Nerves: No cranial nerve deficit.     Coordination: Coordination normal.  Psychiatric:        Mood and Affect: Mood normal.     LABORATORY DATA:  I have reviewed the data as listed Lab Results  Component Value Date   WBC 18.9 (H) 11/09/2018   HGB 11.0 (L) 11/09/2018   HCT 33.5 (L) 11/09/2018   MCV 89.8 11/09/2018   PLT 581 (H) 11/09/2018   Recent Labs    10/19/18 0841 11/02/18 1238 11/09/18 0833  NA 137 132* 131*  K 4.0 4.0 3.5  CL 104 95* 94*  CO2 _0 GLUCOSE 252* 425* 361*  BUN _1 CREATININE 0.61 0.84 0.77  CALCIUM 8.5* 8.5* 8.0*  GFRNONAA >60 >60 >60  GFRAA >60 >60 >60  PROT 6.0* 7.0 6.5  ALBUMIN 3.0* 2.6* 2.3*  AST 14* 11* 12*  ALT _2 ALKPHOS 69 75 63  BILITOT 0.5 0.9 0.8   Iron/TIBC/Ferritin/ %Sat No results found for: IRON, TIBC, FERRITIN, IRONPCTSAT    RADIOGRAPHIC STUDIES: I have personally reviewed the radiological images as listed and agreed with the findings in the report. DG Chest 2 View  Result Date: 05/14/2019 CLINICAL DATA:  Shortness of breath, lung cancer EXAM: CHEST - 2 VIEW COMPARISON:  05/01/2019 FINDINGS: Right dialysis catheter remains  in place, unchanged. Prior CABG. Heart is normal size. Patchy opacities peripherally in the right lung and throughout the left lung. Left lung opacities have increased since prior study. This is concerning for pneumonia superimposed on areas of scarring. Small left pleural effusions suspected. No right effusion. IMPRESSION: Patchy peripheral  airspace opacities throughout the right lung are similar to prior study. Increasing airspace disease throughout the left lung concerning for pneumonia. Small left effusion. Electronically Signed   By: Rolm Baptise M.D.   On: 05/14/2019 14:10   DG Chest 2 View  Result Date: 05/01/2019 CLINICAL DATA:  Shortness of breath EXAM: CHEST - 2 VIEW COMPARISON:  March 29, 2019 FINDINGS: There is a persistent small left pleural effusion. There is scarring and atelectasis throughout portions of the left mid and lower lung regions. There is new ill-defined airspace opacity in the right lower lobe. Heart is upper normal in size with pulmonary vascularity normal. Patient is status post coronary artery bypass grafting. Central catheter tip is in the superior vena cava. No pneumothorax. No bone lesions. IMPRESSION: New area of somewhat ill-defined opacity in the lateral right base, concerning for early pneumonia. Persistent fairly small left pleural effusion with atelectatic change in scarring left mid and lower lung zones. No new opacity on the left. Stable cardiac silhouette. Postoperative changes. Central catheter tip in superior vena cava without pneumothorax. Electronically Signed   By: Lowella Grip III M.D.   On: 05/01/2019 16:04   DG Chest 2 View  Result Date: 03/28/2019 CLINICAL DATA:  End-stage renal disease EXAM: CHEST - 2 VIEW COMPARISON:  03/26/2019 FINDINGS: Cardiac shadow is mildly enlarged but stable. Postsurgical changes and dialysis catheter are again seen. Bilateral pleural effusions are seen stable in appearance from the prior exam. Mild vascular congestion is again noted but slightly improved. No focal confluent infiltrate is seen. IMPRESSION: Slight improvement in the degree of vascular congestion. Bilateral pleural effusions stable from the prior exam. Electronically Signed   By: Inez Catalina M.D.   On: 03/28/2019 14:22   DG Chest 2 View  Result Date: 03/26/2019 CLINICAL DATA:  Shortness of  breath EXAM: CHEST - 2 VIEW COMPARISON:  March 15, 2019 FINDINGS: Heart is mildly enlarged with pulmonary venous hypertension. There is a pleural effusion on the left. There is consolidation in the medial left base. There is mild interstitial edema present with atelectatic change in the left mid and lower lung zones. Patient is status post coronary artery bypass grafting. Central catheter tip is in the superior vena cava. No pneumothorax. No bone lesions. IMPRESSION: Cardiomegaly with pulmonary vascular congestion. Left pleural effusion. There is a degree of interstitial edema. Suspect a degree of congestive heart failure. Consolidation in the medial left base may represent alveolar edema or pneumonia. Both entities may be present concurrently. Central catheter tip is in the superior vena cava. Patient is status post coronary artery bypass grafting. Electronically Signed   By: Lowella Grip III M.D.   On: 03/26/2019 16:56   PERIPHERAL VASCULAR CATHETERIZATION  Result Date: 05/28/2019 See op note  DG Chest Port 1 View  Result Date: 03/29/2019 CLINICAL DATA:  Post thoracentesis. EXAM: PORTABLE CHEST 1 VIEW COMPARISON:  03/28/2019. FINDINGS: Post thoracentesis chest x-ray reveals no evidence of pneumothorax. Significant reduction in right-sided pleural effusion. Small left pleural effusion again noted. Bibasilar atelectasis/infiltrates again noted. Prior CABG. Right IJ line stable position. IMPRESSION: Post thoracentesis chest x-ray reveals no evidence of pneumothorax. Significant reduction in right-sided pleural effusion. Electronically Signed  By: [place]   On: 03/29/2019 15:01   US THORACENTESIS ASP PLEURAL SPACE W/IMG GUIDE  Result Date: 03/29/2019 INDICATION: Pleural effusion. EXAM: ULTRASOUND GUIDED RIGHT THORACENTESIS MEDICATIONS: None. COMPLICATIONS: None immediate. PROCEDURE: An ultrasound guided thoracentesis was thoroughly discussed with the patient and questions answered. The  benefits, risks, alternatives and complications were also discussed. The patient understands and wishes to proceed with the procedure. Written consent was obtained. Ultrasound was performed to localize and mark an adequate pocket of fluid in the right chest. The area was then prepped and draped in the normal sterile fashion. 1% Lidocaine was used for local anesthesia. Under ultrasound guidance a 6 French catheter was introduced. Thoracentesis was performed. The catheter was removed and a dressing applied. FINDINGS: A total of approximately 1 L of yellow fluid was removed. Samples were sent to the laboratory as requested by the clinical team. IMPRESSION: Successful ultrasound guided right thoracentesis yielding 1 L of pleural fluid. Electronically Signed   By: Marcello Moores  Register   On: 03/29/2019 15:04     ASSESSMENT & PLAN:  1. Shortness of breath   2. Malignant neoplasm of unspecified part of unspecified bronchus or lung ([place])   3. Encounter for antineoplastic immunotherapy   4. Stage 4 chronic kidney disease ([place])    #Stage IV metastatic lung adenocarcinoma TxNx M1 Omniseq showed [id number] mutation, results were discussed with patient and wife. BRAF targeted therapy was discontinued due to patient's recent acute illness including acute CHF, DKA, renal failure on hemodialysis.  Off hemodialysis. Labs reviewed and discussed with patient. Proceed with cycle 3 Keytruda. Discussed with patient that I plan to repeat another CT scan after 4 cycles of Keytruda.  #Pleural effusion, decreased left lower lobe breathing sounds.  I will check a chest x-ray #Stage IV CKD Off hemodialysis.  Continue follow-up with nephrology.  On Lasix. #Elevated TSH, continue monitor.  TSH level trended up to 6 today.  Add free T4 level. Anemia, likely secondary to CKD.  Hemoglobin is at 11.5 today.  Continue to monitor. Diabetes, on insulin.  Glucose levels are elevated at 142.  Stable. Elevated alkaline phosphatase,  possible due to immunotherapy.  Continue to monitor.  Levels are stable.  Follow-up in 3 weeks.   Earlie Server, MD, PhD Hematology Oncology [place] at [facility] 06/19/2019

## 2019-06-20 ENCOUNTER — Inpatient Hospital Stay: Payer: Medicare Other

## 2019-06-20 NOTE — Progress Notes (Signed)
Nutrition  RD schedule for nutrition follow-up via phone.  2 attempts to reach patient have been unsuccessful today.  Mail box is full and unable to leave voicemail.   Called wife's mobile number as well and left voicemail.  Lorijean Husser B. Zenia Resides, [place], [place] Registered Dietitian [phone] (pager)

## 2019-06-21 ENCOUNTER — Telehealth: Payer: Self-pay | Admitting: *Deleted

## 2019-06-21 NOTE — Telephone Encounter (Signed)
Call returned to patient and advised of doctor response. He states he is not having shortness of breath at this time and will call us if he deveolps any

## 2019-06-21 NOTE — Telephone Encounter (Signed)
Please let patient know that his pleural effusion on the left side appears to be the same as last x-ray in May.  No progression. If his breathing status remains the same, I recommend observation for now.  I plan to do a CT scan after he finished next 3 months.  If he feels more shortness of breath, I can arrange him to get thoracentesis done for symptom relief.  Thank you

## 2019-06-21 NOTE — Telephone Encounter (Signed)
Call requesting results of xray done Wednesday. Please return call to patient [phone]  IMPRESSION: No significant change since 05/14/2019. Persistent left greater than right pleural effusions with consolidations and ground-glass densities in the left greater than right lungs. CT chest follow-up may be considered given lack of resolution for the findings on the left.   Electronically Signed   By: Donavan Foil M.D.   On: 06/19/2019 21:14

## 2019-07-05 ENCOUNTER — Other Ambulatory Visit: Payer: Self-pay | Admitting: Family Medicine

## 2019-07-10 ENCOUNTER — Inpatient Hospital Stay (HOSPITAL_BASED_OUTPATIENT_CLINIC_OR_DEPARTMENT_OTHER): Payer: Medicare Other | Admitting: Oncology

## 2019-07-10 ENCOUNTER — Other Ambulatory Visit: Payer: Self-pay

## 2019-07-10 ENCOUNTER — Inpatient Hospital Stay: Payer: Medicare Other

## 2019-07-10 ENCOUNTER — Encounter: Payer: Self-pay | Admitting: Oncology

## 2019-07-10 ENCOUNTER — Inpatient Hospital Stay: Payer: Medicare Other | Attending: Oncology

## 2019-07-10 VITALS — BP 112/69 | HR 77 | Temp 96.8°F | Resp 20 | Wt 196.0 lb

## 2019-07-10 DIAGNOSIS — C3490 Malignant neoplasm of unspecified part of unspecified bronchus or lung: Secondary | ICD-10-CM | POA: Insufficient documentation

## 2019-07-10 DIAGNOSIS — Z7189 Other specified counseling: Secondary | ICD-10-CM | POA: Diagnosis not present

## 2019-07-10 DIAGNOSIS — Z87891 Personal history of nicotine dependence: Secondary | ICD-10-CM | POA: Diagnosis not present

## 2019-07-10 DIAGNOSIS — E1122 Type 2 diabetes mellitus with diabetic chronic kidney disease: Secondary | ICD-10-CM | POA: Diagnosis not present

## 2019-07-10 DIAGNOSIS — Z79899 Other long term (current) drug therapy: Secondary | ICD-10-CM | POA: Diagnosis not present

## 2019-07-10 DIAGNOSIS — R748 Abnormal levels of other serum enzymes: Secondary | ICD-10-CM | POA: Diagnosis not present

## 2019-07-10 DIAGNOSIS — I509 Heart failure, unspecified: Secondary | ICD-10-CM | POA: Insufficient documentation

## 2019-07-10 DIAGNOSIS — I132 Hypertensive heart and chronic kidney disease with heart failure and with stage 5 chronic kidney disease, or end stage renal disease: Secondary | ICD-10-CM | POA: Insufficient documentation

## 2019-07-10 DIAGNOSIS — R410 Disorientation, unspecified: Secondary | ICD-10-CM

## 2019-07-10 DIAGNOSIS — R443 Hallucinations, unspecified: Secondary | ICD-10-CM | POA: Diagnosis not present

## 2019-07-10 DIAGNOSIS — Z794 Long term (current) use of insulin: Secondary | ICD-10-CM | POA: Insufficient documentation

## 2019-07-10 DIAGNOSIS — E1136 Type 2 diabetes mellitus with diabetic cataract: Secondary | ICD-10-CM | POA: Insufficient documentation

## 2019-07-10 DIAGNOSIS — D649 Anemia, unspecified: Secondary | ICD-10-CM | POA: Insufficient documentation

## 2019-07-10 DIAGNOSIS — Z5112 Encounter for antineoplastic immunotherapy: Secondary | ICD-10-CM | POA: Diagnosis not present

## 2019-07-10 DIAGNOSIS — M25512 Pain in left shoulder: Secondary | ICD-10-CM

## 2019-07-10 LAB — COMPREHENSIVE METABOLIC PANEL
ALT: 26 U/L (ref 0–44)
AST: 35 U/L (ref 15–41)
Albumin: 2.5 g/dL — ABNORMAL LOW (ref 3.5–5.0)
Alkaline Phosphatase: 408 U/L — ABNORMAL HIGH (ref 38–126)
Anion gap: 10 (ref 5–15)
BUN: 48 mg/dL — ABNORMAL HIGH (ref 8–23)
CO2: 26 mmol/L (ref 22–32)
Calcium: 8.2 mg/dL — ABNORMAL LOW (ref 8.9–10.3)
Chloride: 100 mmol/L (ref 98–111)
Creatinine, Ser: 2.14 mg/dL — ABNORMAL HIGH (ref 0.61–1.24)
GFR calc Af Amer: 37 mL/min — ABNORMAL LOW (ref >60)
GFR calc non Af Amer: 32 mL/min — ABNORMAL LOW (ref >60)
Glucose, Bld: 120 mg/dL — ABNORMAL HIGH (ref 70–99)
Potassium: 4.6 mmol/L (ref 3.5–5.1)
Sodium: 136 mmol/L (ref 135–145)
Total Bilirubin: 0.7 mg/dL (ref 0.3–1.2)
Total Protein: 6.9 g/dL (ref 6.5–8.1)

## 2019-07-10 LAB — CBC WITH DIFFERENTIAL/PLATELET
Abs Immature Granulocytes: 0.04 10*3/uL (ref 0.00–0.07)
Basophils Absolute: 0.1 10*3/uL (ref 0.0–0.1)
Basophils Relative: 1 %
Eosinophils Absolute: 0.2 10*3/uL (ref 0.0–0.5)
Eosinophils Relative: 2 %
HCT: 32.2 % — ABNORMAL LOW (ref 39.0–52.0)
Hemoglobin: 10.9 g/dL — ABNORMAL LOW (ref 13.0–17.0)
Immature Granulocytes: 1 %
Lymphocytes Relative: 6 %
Lymphs Abs: 0.5 10*3/uL — ABNORMAL LOW (ref 0.7–4.0)
MCH: 30.1 pg (ref 26.0–34.0)
MCHC: 33.9 g/dL (ref 30.0–36.0)
MCV: 89 fL (ref 80.0–100.0)
Monocytes Absolute: 0.9 10*3/uL (ref 0.1–1.0)
Monocytes Relative: 11 %
Neutro Abs: 6.3 10*3/uL (ref 1.7–7.7)
Neutrophils Relative %: 79 %
Platelets: 294 10*3/uL (ref 150–400)
RBC: 3.62 MIL/uL — ABNORMAL LOW (ref 4.22–5.81)
RDW: 16.4 % — ABNORMAL HIGH (ref 11.5–15.5)
WBC: 8 10*3/uL (ref 4.0–10.5)
nRBC: 0 % (ref 0.0–0.2)

## 2019-07-10 LAB — T4, FREE: Free T4: 1.3 ng/dL — ABNORMAL HIGH (ref 0.61–1.12)

## 2019-07-10 LAB — TSH: TSH: 5.284 u[IU]/mL — ABNORMAL HIGH (ref 0.350–4.500)

## 2019-07-10 MED ORDER — SODIUM CHLORIDE 0.9 % IV SOLN
Freq: Once | INTRAVENOUS | Status: AC
  Filled 2019-07-10: qty 250

## 2019-07-10 MED ORDER — SODIUM CHLORIDE 0.9 % IV SOLN
200.0000 mg | Freq: Once | INTRAVENOUS | Status: AC
  Administered 2019-07-10: 200 mg via INTRAVENOUS
  Filled 2019-07-10: qty 8

## 2019-07-10 NOTE — Progress Notes (Signed)
Patient has trouble sleeping and has tried Melatonin with no help.  Bilateral ankles edema for the few weeks that started getting worse yesterday and he is taking Furosemide 40 mg QD that was prescribed by Dr. Holley Raring.  Has new left shoulder pain.  Patient's daughter stepped out of the exam room to discuss her concerns of his hallucination episodes.  Chronic SOBr is stable with O2 98% in office check today.

## 2019-07-10 NOTE — Progress Notes (Addendum)
Hematology/Oncologyfollow up  note [facility] [phone] [phone]   Patient Care Team: Tonia Ghent, MD as PCP - General (Family Medicine) Thelma Comp, [place] (Optometry) Telford Nab, RN as Registered Nurse  REFERRING PROVIDER: Tonia Ghent, MD  CHIEF COMPLAINTS/REASON FOR VISIT:  Follow-up for metastatic lung adenocarcinoma  HISTORY OF PRESENTING ILLNESS:   Craig Koch is a  64 y.o.  male with PMH listed below was seen in consultation at the request of  Tonia Ghent, MD  for evaluation of malignant pleural effusion. Patient was recently admitted to [facility] due to large amount of pleural effusion, acute hypoxic respiratory failure. Patient reports feeling shortness of breath and dyspnea on exertion for several weeks and presented to emergency room.  A chest x-ray 08/23/2018 showed a large amount of left pleural effusion 08/24/2018 CT chest abdomen pelvis confirmed large left pleural effusion with near complete collapse of the left lower lobe and extensive volume loss in the lingula and a portion of the left upper lobe. No acute intra-abdominal process.  Cholelithiasis, diverticulitis, coronary atherosclerosis post CABG.  Bilateral renal cortical scarring.  Aortic atherosclerosis.  #08/24/2018 ultrasound guided diagnostic and therapeutic thoracentesis removed 1.8 L of pleural fluid. Patient subsequently felt better and was discharged to follow-up with pulmonology. 09/04/2018 patient followed up with Dr.Aleskerov chest x-ray showed recurrent moderate left pleural effusion.  Patient underwent ultrasound-guided left thoracentesis again and it drained 3 L of fluid. Pleural fluid cytology positive for adenocarcinoma. Patient was referred to me for further evaluation and management.  # PET eft sided pleural hypermetabolic activity  consistent with malignant pleural effusion. Left upper and lower lobe low-level hypermetabolic him  corresponding to areas of airspace and groundglass opacity.  This was felt to be secondary to atelectasis.  Underlying left upper lobe primary bronchogenic carcinoma cannot be excluded. Medial left upper lobe area more focal hypermetabolic area could represent a pleural disease or a site of small left upper lobe primary. No evidence of extra thoracic hypermetabolic metastatic disease.  Left sided hydro-pneumothorax within the pleural air component being new since the prior CT. Cholelithiasis.  #MRI brain showed punctate focus of enhancement within the right parietal lobe cortex suspicious for possible metastasis. Molecular study showed BRAF V600 mutation. 11/16/2018 started on dabrafenib 150 mg twice daily and trametinib 2 mg daily.  He had good response  #Patient was admitted from 03/03/2019-03/19/2019 due to DKA due to uncontrolled diabetes, acute decompensation of CHF left lower lobe pneumonia right pleural effusion status post thoracentesis.  Fluid study was consistent with transudate. AKI secondary to ATN, on hemodialysis Tuesday Thursday and Saturday, atrial fibrillation with RVR, paroxysmal.  Patient was discharged on Eliquis and amiodarone. Patient was readmitted from 03/26/2019-04/01/2019 due to CHF exacerbation, right pleural effusion recurrent.  Status post diagnostic and therapeutic thoracentesis.  Cytology was negative for malignant cells.   INTERVAL HISTORY Craig Koch is a 64 y.o. male who has above history reviewed by me today presents for follow-up of lung cancer Problems and complaints are listed below:  Patient was accompanied by his daughter today. He continues to have chronic shortness of breath with exertion.  He feels that his breathing status has not significantly changed. Appetite is good.  Currently he is on Lasix 40 mg daily, managed by nephrologist He reports new left shoulder pain, worsening with movement Patient's daughter also reports episodes of hallucination and  confusion. Bilateral lower extremity swelling are worse.  Review of Systems  Constitutional: Positive for fatigue.  Negative for appetite change, chills, diaphoresis and fever.  HENT:   Negative for hearing loss, lump/mass, nosebleeds, sore throat and voice change.   Eyes: Negative for eye problems and icterus.  Respiratory: Positive for shortness of breath. Negative for chest tightness, cough, hemoptysis and wheezing.   Cardiovascular: Positive for leg swelling. Negative for chest pain.  Gastrointestinal: Negative for abdominal distention, abdominal pain, blood in stool, diarrhea, nausea and rectal pain.  Endocrine: Negative for hot flashes.  Genitourinary: Negative for bladder incontinence, difficulty urinating, dysuria, frequency, hematuria and nocturia.   Musculoskeletal: Negative for arthralgias, back pain, flank pain, gait problem and myalgias.  Skin: Negative for itching and rash.  Neurological: Negative for dizziness, gait problem, headaches, light-headedness, numbness and seizures.  Hematological: Negative for adenopathy. Bruises/bleeds easily.  Psychiatric/Behavioral: Positive for confusion. Negative for decreased concentration. The patient is not nervous/anxious.     MEDICAL HISTORY:  Past Medical History:  Diagnosis Date  . Anxiety   . Arthritis   . CHF (congestive heart failure) ([place])   . Coronary artery disease   . Diabetes mellitus   . Dyslipidemia   . Hx of CABG   . Hyperlipidemia   . Hypertension   . Malignant neoplasm of unspecified part of unspecified bronchus or lung ([place]) 08/2018   Immunotherapy  . Pleural effusion     SURGICAL HISTORY: Past Surgical History:  Procedure Laterality Date  . CATARACT EXTRACTION Left 09/2015  . CHEST TUBE INSERTION Left 10/01/2018   Procedure: INSERTION PLEURAL DRAINAGE CATHETER;  Surgeon: [name], MD;  Location: [facility] ORS;  Service: Thoracic;  Laterality: Left;  . CORONARY ARTERY BYPASS GRAFT  2004   (CABG with LIMA to  the  LAD, SVG to OM2/OM3, SVG  to diag  . DIALYSIS/PERMA CATHETER INSERTION N/A 03/14/2019   Procedure: DIALYSIS/PERMA CATHETER INSERTION;  Surgeon: Algernon Huxley, MD;  Location: [place] [facility];  Service: Cardiovascular;  Laterality: N/A;  . DIALYSIS/PERMA CATHETER REMOVAL N/A 05/28/2019   Procedure: DIALYSIS/PERMA CATHETER REMOVAL;  Surgeon: Katha Cabal, MD;  Location: [place] [facility];  Service: Cardiovascular;  Laterality: N/A;  . Left ankle surgery     repair of fracture  . Right lower leg surgery     rod  . TEMPORARY DIALYSIS CATHETER N/A 03/11/2019   Procedure: TEMPORARY DIALYSIS CATHETER;  Surgeon: Algernon Huxley, MD;  Location: [place] [facility];  Service: Cardiovascular;  Laterality: N/A;   SOCIAL HISTORY: Social History   Socioeconomic History  . Marital status: Married    Spouse name: Not on file  . Number of children: Not on file  . Years of education: Not on file  . Highest education level: Not on file  Occupational History  . Not on file  Tobacco Use  . Smoking status: Former Smoker    Packs/day: 0.20    Years: 45.00    Pack years: 9.00    Types: Cigarettes    Quit date: 03/05/2019    Years since quitting: 0.3  . Smokeless tobacco: Former Systems developer    Types: Snuff  Vaping Use  . Vaping Use: Never used  Substance and Sexual Activity  . Alcohol use: Not Currently    Alcohol/week: 6.0 standard drinks    Types: 6 Cans of beer per week    Comment: occ, average 6 pack in a week  . Drug use: No  . Sexual activity: Not Currently  Other Topics Concern  . Not on file  Social History Narrative   On disability  2009 after prev injuries and CAD.     Married 1976   2 kids, 4 grandkids.    Social Determinants of Health   Financial Resource Strain:   . Difficulty of Paying Living Expenses:   Food Insecurity:   . Worried About Charity fundraiser in the Last Year:   . Arboriculturist in the Last Year:   Transportation Needs:   . Film/video editor  (Medical):   [name] Lack of Transportation (Non-Medical):   Physical Activity:   . Days of Exercise per Week:   . Minutes of Exercise per Session:   Stress:   . Feeling of Stress :   Social Connections:   . Frequency of Communication with Friends and Family:   . Frequency of Social Gatherings with Friends and Family:   . Attends Religious Services:   . Active Member of Clubs or Organizations:   . Attends Archivist Meetings:   [name] Marital Status:   Intimate Partner Violence:   . Fear of Current or Ex-Partner:   . Emotionally Abused:   [name] Physically Abused:   . Sexually Abused:      FAMILY HISTORY: Family History  Problem Relation Age of Onset  . Dementia Mother   . Heart disease Father   . Colon cancer Neg Hx   . Prostate cancer Neg Hx   . Diabetes Neg Hx    ALLERGIES:  Allergies  Allergen Reactions  . Lipitor [Atorvastatin Calcium] Other (See Comments)    Aches.  Tolerated crestor.      MEDICATIONS: PHYSICAL EXAMINATION: Current Outpatient Medications on File Prior to Visit  Medication Sig Dispense Refill  . albuterol (VENTOLIN HFA) 108 (90 Base) MCG/ACT inhaler Inhale 2 puffs into the lungs every 6 (six) hours as needed for wheezing or shortness of breath. 18 g 0  . amiodarone (PACERONE) 200 MG tablet Take 1 tablet (200 mg total) by mouth daily. 90 tablet 0  . apixaban (ELIQUIS) 5 MG TABS tablet Take 1 tablet (5 mg total) by mouth 2 (two) times daily. 60 tablet 5  . clopidogrel (PLAVIX) 75 MG tablet Take 75 mg by mouth daily.    [name] dextromethorphan-guaiFENesin (MUCINEX DM) 30-600 MG 12hr tablet Take 1 tablet by mouth 2 (two) times daily.    . DULoxetine (CYMBALTA) 30 MG capsule Take 1 capsule (30 mg total) by mouth daily. 30 capsule 0  . ezetimibe (ZETIA) 10 MG tablet Take 1 tablet (10 mg total) by mouth daily. 30 tablet 5  . ferrous sulfate 325 (65 FE) MG tablet Take 1 tablet (325 mg total) by mouth 2 (two) times daily with a meal. 258 tablet 0  . folic acid  (FOLVITE) 1 MG tablet Take 1 tablet (1 mg total) by mouth daily. (Patient taking differently: Take 400 mcg by mouth daily. )    . furosemide (LASIX) 40 MG tablet Take 40 mg by mouth daily.    [name] HUMALOG 100 UNIT/ML injection INJECT (20 UNITS TOTAL) INTO THE SKIN 3 (THREE) TIMES DAILY WITH MEALS. 20 mL 2  . insulin detemir (LEVEMIR) 100 UNIT/ML injection Inject 0.2 mLs (20 Units total) into the skin at bedtime.    . metoprolol succinate (TOPROL-XL) 25 MG 24 hr tablet Take 0.5 tablets (12.5 mg total) by mouth daily. 45 tablet 1  . Multiple Vitamin (MULTIVITAMIN WITH MINERALS) TABS tablet Take 1 tablet by mouth at bedtime.    . multivitamin (RENA-VIT) TABS tablet Take 1 tablet by mouth at bedtime.  0  . rosuvastatin (CRESTOR) 10 MG tablet TAKE 1 TABLET (10 MG TOTAL) BY MOUTH DAILY. 90 tablet 3  . insulin aspart (NOVOLOG) 100 UNIT/ML injection Inject 0-8 Units into the skin as directed. Take 0 units if CBG 70-150 Take 1 units if CBG 151-200 Take 2 units if CBG 201-250 Take 3 units if CBG 251-300 Take 4 units if CBG 301-350 Take 6 units if CBG 351-400 If CBG > 400, give 8 units and call MD (Patient not taking: Reported on 07/10/2019) 10 mL    No current facility-administered medications on file prior to visit.    ECOG PERFORMANCE STATUS:2  Today's Vitals   07/10/19 0951  BP: 112/69  Pulse: 77  Resp: 20  Temp: (!) 96.8 F (36 C)  SpO2: 98%  Weight: 196 lb (88.9 kg)  PainSc: 6    Body mass index is 26.58 kg/m.  Physical Exam Constitutional:      General: He is not in acute distress.    Comments: Patient sits in the wheelchair.  HENT:     Head: Normocephalic and atraumatic.  Eyes:     General: No scleral icterus.    Pupils: Pupils are equal, round, and reactive to light.  Cardiovascular:     Rate and Rhythm: Normal rate and regular rhythm.     Heart sounds: Normal heart sounds.  Pulmonary:     Effort: Pulmonary effort is normal. No respiratory distress.     Breath sounds: No  wheezing.     Comments: Good air entry the right lower lung base. Decreased breath sound left lower lung. Abdominal:     General: Bowel sounds are normal. There is no distension.     Palpations: Abdomen is soft. There is no mass.     Tenderness: There is no abdominal tenderness.  Musculoskeletal:        General: Swelling present. No deformity. Normal range of motion.     Cervical back: Normal range of motion and neck supple.  Skin:    General: Skin is warm and dry.     Findings: No erythema or rash.  Neurological:     Mental Status: He is alert. Mental status is at baseline.     Cranial Nerves: No cranial nerve deficit.     Coordination: Coordination normal.  Psychiatric:        Mood and Affect: Mood normal.     LABORATORY DATA:  I have reviewed the data as listed Lab Results  Component Value Date   WBC 18.9 (H) 11/09/2018   HGB 11.0 (L) 11/09/2018   HCT 33.5 (L) 11/09/2018   MCV 89.8 11/09/2018   PLT 581 (H) 11/09/2018   Recent Labs    10/19/18 0841 11/02/18 1238 11/09/18 0833  NA 137 132* 131*  K 4.0 4.0 3.5  CL 104 95* 94*  CO2 _0 GLUCOSE 252* 425* 361*  BUN _1 CREATININE 0.61 0.84 0.77  CALCIUM 8.5* 8.5* 8.0*  GFRNONAA >60 >60 >60  GFRAA >60 >60 >60  PROT 6.0* 7.0 6.5  ALBUMIN 3.0* 2.6* 2.3*  AST 14* 11* 12*  ALT _2 ALKPHOS 69 75 63  BILITOT 0.5 0.9 0.8   Iron/TIBC/Ferritin/ %Sat No results found for: IRON, TIBC, FERRITIN, IRONPCTSAT    RADIOGRAPHIC STUDIES: I have personally reviewed the radiological images as listed and agreed with the findings in the report. DG Chest 2 View  Result Date: 06/19/2019 CLINICAL DATA:  History of lung cancer short  of breath EXAM: CHEST - 2 VIEW COMPARISON:  05/14/2019, CT 03/14/2019 FINDINGS: Post sternotomy changes. Removal of right-sided central venous catheter. No significant radiographic change in appearance of the chest since 05/14/2019 with small left greater than right pleural effusions and  patchy foci of airspace disease and ground-glass opacity in the left greater than right thorax. Stable cardiomediastinal silhouette. Aortic atherosclerosis. IMPRESSION: No significant change since 05/14/2019. Persistent left greater than right pleural effusions with consolidations and ground-glass densities in the left greater than right lungs. CT chest follow-up may be considered given lack of resolution for the findings on the left. Electronically Signed   By: Donavan Foil M.D.   On: 06/19/2019 21:14   DG Chest 2 View  Result Date: 05/14/2019 CLINICAL DATA:  Shortness of breath, lung cancer EXAM: CHEST - 2 VIEW COMPARISON:  05/01/2019 FINDINGS: Right dialysis catheter remains in place, unchanged. Prior CABG. Heart is normal size. Patchy opacities peripherally in the right lung and throughout the left lung. Left lung opacities have increased since prior study. This is concerning for pneumonia superimposed on areas of scarring. Small left pleural effusions suspected. No right effusion. IMPRESSION: Patchy peripheral airspace opacities throughout the right lung are similar to prior study. Increasing airspace disease throughout the left lung concerning for pneumonia. Small left effusion. Electronically Signed   By: Rolm Baptise M.D.   On: 05/14/2019 14:10   DG Chest 2 View  Result Date: 05/01/2019 CLINICAL DATA:  Shortness of breath EXAM: CHEST - 2 VIEW COMPARISON:  March 29, 2019 FINDINGS: There is a persistent small left pleural effusion. There is scarring and atelectasis throughout portions of the left mid and lower lung regions. There is new ill-defined airspace opacity in the right lower lobe. Heart is upper normal in size with pulmonary vascularity normal. Patient is status post coronary artery bypass grafting. Central catheter tip is in the superior vena cava. No pneumothorax. No bone lesions. IMPRESSION: New area of somewhat ill-defined opacity in the lateral right base, concerning for early pneumonia.  Persistent fairly small left pleural effusion with atelectatic change in scarring left mid and lower lung zones. No new opacity on the left. Stable cardiac silhouette. Postoperative changes. Central catheter tip in superior vena cava without pneumothorax. Electronically Signed   By: Lowella Grip III M.D.   On: 05/01/2019 16:04   PERIPHERAL VASCULAR CATHETERIZATION  Result Date: 05/28/2019 See op note    ASSESSMENT & PLAN:  1. Malignant neoplasm of unspecified part of unspecified bronchus or lung (HCC)   2. Pain in joint of left shoulder   3. Goals of care, counseling/discussion   4. Confusion   5. Encounter for antineoplastic immunotherapy    #Stage IV metastatic lung adenocarcinoma TxNx M1 Omniseq showed [id number] mutation, results were discussed with patient and wife. BRAF targeted therapy was discontinued due to patient's recent acute illness including acute CHF, DKA, renal failure on hemodialysis.  Off hemodialysis. Labs are reviewed and discussed with patient and daughter. Proceed with cycle 4 Keytruda. Plan to obtain PET scan for evaluation of treatment response.  #Confusion/hallucination episodes, patient has a history of CNS metastasis which got better when he was on target therapy. Suspect worsening of CNS diseases.  Obtain with MRI of the brain.  #Left shoulder pain, I will obtain x-ray of the shoulder for further evaluation.  #Bilateral lower extremity edema, likely secondary to CHF as well as CKD. Patient is on Lasix 40 mg daily. Less likely thrombosis as patient has been on Eliquis 5 mg  twice daily. Recommend leg elevation, compression stocking  Anemia, likely secondary to CKD.  Hemoglobin is 10.9.  Continue to monitor. Diabetes, on insulin.  His blood glucose level has been stable. Elevated alkaline phosphatase, possible due to immunotherapy.   AST and ALT has been normal. ? Bone mets.   Check GGT.  Left shoulder pain, cannot take NSAIDs due to CKD.  Has sent  oxycodone 5 mg every 8 hours as needed #I had a discussion with patient and his daughter regarding his options if repeat images showed disease progression.  Patient is not interested in any chemotherapy.  His only other option will be to resume low-dose targeted therapy, and that needs to be cleared by cardiology.   Continue follow-up palliative care Follow-up in 3 weeks.   Earlie Server, MD, PhD Hematology Oncology [place] at [facility] 07/10/2019

## 2019-07-11 ENCOUNTER — Inpatient Hospital Stay: Payer: Medicare Other

## 2019-07-11 ENCOUNTER — Ambulatory Visit
Admission: RE | Admit: 2019-07-11 | Discharge: 2019-07-11 | Disposition: A | Discharge status: Home / Self Care | Payer: Medicare Other | Source: Ambulatory Visit | Attending: Oncology | Admitting: Oncology

## 2019-07-11 ENCOUNTER — Ambulatory Visit
Admission: RE | Admit: 2019-07-11 | Discharge: 2019-07-11 | Disposition: A | Discharge status: Home / Self Care | Payer: Medicare Other | Source: Home / Self Care | Attending: Oncology | Admitting: Oncology

## 2019-07-11 ENCOUNTER — Telehealth: Payer: Self-pay | Admitting: Dietician

## 2019-07-11 ENCOUNTER — Telehealth: Payer: Self-pay | Admitting: *Deleted

## 2019-07-11 DIAGNOSIS — C3490 Malignant neoplasm of unspecified part of unspecified bronchus or lung: Secondary | ICD-10-CM | POA: Diagnosis not present

## 2019-07-11 DIAGNOSIS — J320 Chronic maxillary sinusitis: Secondary | ICD-10-CM | POA: Diagnosis not present

## 2019-07-11 DIAGNOSIS — J0100 Acute maxillary sinusitis, unspecified: Secondary | ICD-10-CM | POA: Diagnosis not present

## 2019-07-11 DIAGNOSIS — M25512 Pain in left shoulder: Secondary | ICD-10-CM

## 2019-07-11 DIAGNOSIS — J3489 Other specified disorders of nose and nasal sinuses: Secondary | ICD-10-CM | POA: Diagnosis not present

## 2019-07-11 DIAGNOSIS — I6782 Cerebral ischemia: Secondary | ICD-10-CM | POA: Diagnosis not present

## 2019-07-11 MED ORDER — OXYCODONE HCL 5 MG PO TABS: 5.0000 mg | ORAL_TABLET | Freq: Three times a day (TID) | ORAL | 0 refills | Status: DC | PRN

## 2019-07-11 NOTE — Telephone Encounter (Signed)
Nutrition  Patient scheduled for nutrition follow-up via phone today. RD called at scheduled time, received voicemail. Message left with contact number.   Lajuan Lines, RD, LDN Clinical Nutrition After Hours/Weekend Pager # in [place]

## 2019-07-11 NOTE — Telephone Encounter (Signed)
Detailed VM left notifying pt of prescription that was sent to pharm.

## 2019-07-11 NOTE — Telephone Encounter (Signed)
Sent oxycodone Rx. Thanks.

## 2019-07-11 NOTE — Addendum Note (Signed)
Addended by: Earlie Server on: 07/11/2019 12:22 PM   Modules accepted: Orders

## 2019-07-11 NOTE — Telephone Encounter (Signed)
Wife called reporting that pain medicine was to have been sent in after yesterdays visit, but the pharmacy never received a prescription . Please notify wife once this has been sent

## 2019-07-16 ENCOUNTER — Other Ambulatory Visit: Payer: Self-pay | Admitting: Family Medicine

## 2019-07-16 DIAGNOSIS — D509 Iron deficiency anemia, unspecified: Secondary | ICD-10-CM

## 2019-07-18 ENCOUNTER — Telehealth: Payer: Self-pay | Admitting: *Deleted

## 2019-07-18 ENCOUNTER — Other Ambulatory Visit: Payer: Self-pay

## 2019-07-18 ENCOUNTER — Ambulatory Visit: Payer: Medicare Other

## 2019-07-18 ENCOUNTER — Encounter
Admission: RE | Admit: 2019-07-18 | Discharge: 2019-07-18 | Disposition: A | Discharge status: Home / Self Care | Payer: Medicare Other | Source: Ambulatory Visit | Attending: Oncology | Admitting: Oncology

## 2019-07-18 DIAGNOSIS — C3490 Malignant neoplasm of unspecified part of unspecified bronchus or lung: Secondary | ICD-10-CM | POA: Insufficient documentation

## 2019-07-18 DIAGNOSIS — J984 Other disorders of lung: Secondary | ICD-10-CM | POA: Diagnosis not present

## 2019-07-18 DIAGNOSIS — J90 Pleural effusion, not elsewhere classified: Secondary | ICD-10-CM | POA: Diagnosis not present

## 2019-07-18 DIAGNOSIS — J189 Pneumonia, unspecified organism: Secondary | ICD-10-CM | POA: Diagnosis not present

## 2019-07-18 DIAGNOSIS — I5043 Acute on chronic combined systolic (congestive) and diastolic (congestive) heart failure: Secondary | ICD-10-CM | POA: Diagnosis not present

## 2019-07-18 LAB — GLUCOSE, CAPILLARY
Glucose-Capillary: 113 mg/dL — ABNORMAL HIGH (ref 70–99)
Glucose-Capillary: 142 mg/dL — ABNORMAL HIGH (ref 70–99)

## 2019-07-18 MED ORDER — FLUDEOXYGLUCOSE F - 18 (FDG) INJECTION
10.2000 | Freq: Once | INTRAVENOUS | Status: AC | PRN
  Administered 2019-07-18: 10.93 via INTRAVENOUS

## 2019-07-18 NOTE — Telephone Encounter (Signed)
Called report  IMPRESSION: 1.  No evidence of lung cancer recurrence or metastatic disease.  2. Dense airspace disease in the right lower lobe on the background of more diffuse airspace disease. Recommend clinical correlation for pneumonia or immunotherapy related pneumonitis  These results will be called to the ordering clinician or representative by the Radiologist Assistant, and communication documented in the PACS or [employer].   Electronically Signed   By: Suzy Bouchard M.D.   On: 07/18/2019 14:13

## 2019-07-20 ENCOUNTER — Other Ambulatory Visit: Payer: Self-pay | Admitting: Oncology

## 2019-07-20 ENCOUNTER — Telehealth: Payer: Self-pay | Admitting: Oncology

## 2019-07-20 DIAGNOSIS — R0602 Shortness of breath: Secondary | ICD-10-CM

## 2019-07-20 DIAGNOSIS — C3490 Malignant neoplasm of unspecified part of unspecified bronchus or lung: Secondary | ICD-10-CM

## 2019-07-20 MED ORDER — DOXYCYCLINE HYCLATE 100 MG PO TABS
100.0000 mg | ORAL_TABLET | Freq: Two times a day (BID) | ORAL | 0 refills | Status: DC
Start: 2019-07-20 — End: 2019-07-26

## 2019-07-20 MED ORDER — PREDNISONE 20 MG PO TABS: 40.0000 mg | ORAL_TABLET | Freq: Every day | ORAL | 0 refills | Status: DC

## 2019-07-20 NOTE — Telephone Encounter (Signed)
Discussed with pulmonology. PET scan was reviewed.  Pneumonitis vs pneumonia, plan short course of prednisone 40mg  daily, and doxycycline 100mg  BID x 5 days.  Rx sent to pharmacy. CXR 1 day prior to his appt with me on 7/28. Patient was notified and he agrees with the plan.

## 2019-07-25 DIAGNOSIS — N1832 Chronic kidney disease, stage 3b: Secondary | ICD-10-CM | POA: Diagnosis not present

## 2019-07-25 DIAGNOSIS — N2581 Secondary hyperparathyroidism of renal origin: Secondary | ICD-10-CM | POA: Diagnosis not present

## 2019-07-25 DIAGNOSIS — R600 Localized edema: Secondary | ICD-10-CM | POA: Diagnosis not present

## 2019-07-25 DIAGNOSIS — D631 Anemia in chronic kidney disease: Secondary | ICD-10-CM | POA: Diagnosis not present

## 2019-07-26 ENCOUNTER — Telehealth: Payer: Self-pay | Admitting: *Deleted

## 2019-07-26 ENCOUNTER — Other Ambulatory Visit: Payer: Self-pay

## 2019-07-26 ENCOUNTER — Other Ambulatory Visit: Payer: Self-pay | Admitting: Oncology

## 2019-07-26 DIAGNOSIS — R0602 Shortness of breath: Secondary | ICD-10-CM

## 2019-07-26 DIAGNOSIS — R05 Cough: Secondary | ICD-10-CM

## 2019-07-26 DIAGNOSIS — R059 Cough, unspecified: Secondary | ICD-10-CM

## 2019-07-26 MED ORDER — PROMETHAZINE HCL 25 MG PO TABS: 25.0000 mg | ORAL_TABLET | Freq: Four times a day (QID) | ORAL | 0 refills | Status: DC | PRN

## 2019-07-26 MED ORDER — DOXYCYCLINE HYCLATE 100 MG PO TABS: 100.0000 mg | ORAL_TABLET | Freq: Two times a day (BID) | ORAL | 0 refills | Status: DC

## 2019-07-26 NOTE — Telephone Encounter (Signed)
Patient wife called and states that patient saw Dr Holley Raring today and he told them that Dr Tasia Catchings needs to order more antibiotics for patient

## 2019-07-26 NOTE — Telephone Encounter (Signed)
Contcted pt and he states he is still having shortness of breath, which he has had for a while and he is also coughing up clear mucus. Does not report getting better or worse. Recommended for him to contact pulmonologist if symptoms get worse. For now, Dr Tasia Catchings will refill antibiotic for 3 more days. Also refilling promethazine, per pt request. Dr. Tasia Catchings wants him to get a chest xray on monday and see symptom management. Scheduled for Monday at 9:15. Pt aware that he is to get chest xray and then come to [facility] on 7/26.

## 2019-07-29 ENCOUNTER — Ambulatory Visit
Admission: RE | Admit: 2019-07-29 | Discharge: 2019-07-29 | Disposition: A | Discharge status: Home / Self Care | Payer: Medicare Other | Source: Ambulatory Visit | Attending: Oncology | Admitting: Oncology

## 2019-07-29 ENCOUNTER — Inpatient Hospital Stay (HOSPITAL_BASED_OUTPATIENT_CLINIC_OR_DEPARTMENT_OTHER): Payer: Medicare Other | Admitting: Nurse Practitioner

## 2019-07-29 ENCOUNTER — Ambulatory Visit
Admission: RE | Admit: 2019-07-29 | Discharge: 2019-07-29 | Disposition: A | Discharge status: Home / Self Care | Payer: Medicare Other | Attending: Oncology | Admitting: Oncology

## 2019-07-29 ENCOUNTER — Encounter: Payer: Self-pay | Admitting: Nurse Practitioner

## 2019-07-29 ENCOUNTER — Inpatient Hospital Stay: Payer: Medicare Other

## 2019-07-29 ENCOUNTER — Other Ambulatory Visit: Payer: Self-pay

## 2019-07-29 VITALS — BP 102/55 | HR 87 | Temp 96.6°F | Resp 18 | Wt 188.2 lb

## 2019-07-29 DIAGNOSIS — R059 Cough, unspecified: Secondary | ICD-10-CM

## 2019-07-29 DIAGNOSIS — R05 Cough: Secondary | ICD-10-CM | POA: Diagnosis not present

## 2019-07-29 DIAGNOSIS — R0600 Dyspnea, unspecified: Secondary | ICD-10-CM | POA: Diagnosis not present

## 2019-07-29 DIAGNOSIS — I132 Hypertensive heart and chronic kidney disease with heart failure and with stage 5 chronic kidney disease, or end stage renal disease: Secondary | ICD-10-CM | POA: Diagnosis not present

## 2019-07-29 DIAGNOSIS — E1136 Type 2 diabetes mellitus with diabetic cataract: Secondary | ICD-10-CM | POA: Diagnosis not present

## 2019-07-29 DIAGNOSIS — E1122 Type 2 diabetes mellitus with diabetic chronic kidney disease: Secondary | ICD-10-CM | POA: Diagnosis not present

## 2019-07-29 DIAGNOSIS — Z794 Long term (current) use of insulin: Secondary | ICD-10-CM | POA: Diagnosis not present

## 2019-07-29 DIAGNOSIS — Z79899 Other long term (current) drug therapy: Secondary | ICD-10-CM | POA: Diagnosis not present

## 2019-07-29 DIAGNOSIS — J189 Pneumonia, unspecified organism: Secondary | ICD-10-CM

## 2019-07-29 DIAGNOSIS — R0602 Shortness of breath: Secondary | ICD-10-CM | POA: Diagnosis not present

## 2019-07-29 DIAGNOSIS — C3490 Malignant neoplasm of unspecified part of unspecified bronchus or lung: Secondary | ICD-10-CM | POA: Diagnosis not present

## 2019-07-29 DIAGNOSIS — D649 Anemia, unspecified: Secondary | ICD-10-CM | POA: Diagnosis not present

## 2019-07-29 DIAGNOSIS — Z5112 Encounter for antineoplastic immunotherapy: Secondary | ICD-10-CM | POA: Diagnosis not present

## 2019-07-29 DIAGNOSIS — M25512 Pain in left shoulder: Secondary | ICD-10-CM | POA: Diagnosis not present

## 2019-07-29 DIAGNOSIS — I509 Heart failure, unspecified: Secondary | ICD-10-CM | POA: Diagnosis not present

## 2019-07-29 DIAGNOSIS — R748 Abnormal levels of other serum enzymes: Secondary | ICD-10-CM | POA: Diagnosis not present

## 2019-07-29 DIAGNOSIS — Z87891 Personal history of nicotine dependence: Secondary | ICD-10-CM | POA: Diagnosis not present

## 2019-07-29 DIAGNOSIS — R0609 Other forms of dyspnea: Secondary | ICD-10-CM

## 2019-07-29 DIAGNOSIS — J90 Pleural effusion, not elsewhere classified: Secondary | ICD-10-CM | POA: Diagnosis not present

## 2019-07-29 LAB — COMPREHENSIVE METABOLIC PANEL
ALT: 36 U/L (ref 0–44)
AST: 34 U/L (ref 15–41)
Albumin: 2.8 g/dL — ABNORMAL LOW (ref 3.5–5.0)
Alkaline Phosphatase: 394 U/L — ABNORMAL HIGH (ref 38–126)
Anion gap: 9 (ref 5–15)
BUN: 49 mg/dL — ABNORMAL HIGH (ref 8–23)
CO2: 28 mmol/L (ref 22–32)
Calcium: 8.2 mg/dL — ABNORMAL LOW (ref 8.9–10.3)
Chloride: 102 mmol/L (ref 98–111)
Creatinine, Ser: 2 mg/dL — ABNORMAL HIGH (ref 0.61–1.24)
GFR calc Af Amer: 40 mL/min — ABNORMAL LOW (ref >60)
GFR calc non Af Amer: 34 mL/min — ABNORMAL LOW (ref >60)
Glucose, Bld: 241 mg/dL — ABNORMAL HIGH (ref 70–99)
Potassium: 4.4 mmol/L (ref 3.5–5.1)
Sodium: 139 mmol/L (ref 135–145)
Total Bilirubin: 0.8 mg/dL (ref 0.3–1.2)
Total Protein: 7.1 g/dL (ref 6.5–8.1)

## 2019-07-29 LAB — CBC WITH DIFFERENTIAL/PLATELET
Abs Immature Granulocytes: 0.08 10*3/uL — ABNORMAL HIGH (ref 0.00–0.07)
Basophils Absolute: 0.1 10*3/uL (ref 0.0–0.1)
Basophils Relative: 1 %
Eosinophils Absolute: 0.2 10*3/uL (ref 0.0–0.5)
Eosinophils Relative: 2 %
HCT: 37.9 % — ABNORMAL LOW (ref 39.0–52.0)
Hemoglobin: 12.3 g/dL — ABNORMAL LOW (ref 13.0–17.0)
Immature Granulocytes: 1 %
Lymphocytes Relative: 6 %
Lymphs Abs: 0.5 10*3/uL — ABNORMAL LOW (ref 0.7–4.0)
MCH: 29.4 pg (ref 26.0–34.0)
MCHC: 32.5 g/dL (ref 30.0–36.0)
MCV: 90.7 fL (ref 80.0–100.0)
Monocytes Absolute: 1 10*3/uL (ref 0.1–1.0)
Monocytes Relative: 11 %
Neutro Abs: 6.8 10*3/uL (ref 1.7–7.7)
Neutrophils Relative %: 79 %
Platelets: 240 10*3/uL (ref 150–400)
RBC: 4.18 MIL/uL — ABNORMAL LOW (ref 4.22–5.81)
RDW: 17 % — ABNORMAL HIGH (ref 11.5–15.5)
WBC: 8.5 10*3/uL (ref 4.0–10.5)
nRBC: 0 % (ref 0.0–0.2)

## 2019-07-29 LAB — BRAIN NATRIURETIC PEPTIDE: B Natriuretic Peptide: 1318.7 pg/mL — ABNORMAL HIGH (ref 0.0–100.0)

## 2019-07-29 MED ORDER — LEVOFLOXACIN 250 MG PO TABS: ORAL_TABLET | ORAL | 0 refills | Status: AC

## 2019-07-29 NOTE — Progress Notes (Addendum)
Symptom Management [place]  Telephone:([phone]) [phone] [phone]) [phone]  Patient Care Team: Tonia Ghent, MD as PCP - General (Family Medicine) Minna Merritts, MD as PCP - Cardiology (Cardiology) Thelma Comp, [place] (Optometry) Telford Nab, RN as Registered Nurse Anthonette Legato, MD (Nephrology)   Name of the patient: Craig Koch  [id number]  [birth year]   Date of visit: 07/29/19  Diagnosis-stage IV metastatic lung adenocarcinoma  Chief complaint/ Reason for visit-shortness of breath  Heme/Onc history:  Oncology History  Malignant neoplasm of unspecified part of unspecified bronchus or lung ([place])  09/08/2018 Initial Diagnosis   lung adenocarcinoma   10/19/2018 -  Chemotherapy   The patient had pembrolizumab (KEYTRUDA) 200 mg in sodium chloride 0.9 % 50 mL chemo infusion, 200 mg, Intravenous, Once, 6 of 8 cycles Administration: 200 mg (10/19/2018), 200 mg (11/09/2018), 200 mg (05/08/2019), 200 mg (05/29/2019), 200 mg (06/19/2019), 200 mg (07/10/2019)  for chemotherapy treatment.      Interval history-patient 64 year old male, with pmh of CABG, hptn, kidney disease, diabetes, chf, stage IV lung cancer with malignant pleural effusion, who presents to clinic today for complaints of shortness of breath.  He says this is a chronic problem since he was diagnosed with lung cancer.  He initiated [place] on 05/08/2019 and is currently status post cycle 4 on 07/10/2019.  PET on 07/18/2019 showed no evidence of lung cancer recurrence or metastatic disease.  Dense airspace disease in the right lower lobe concerning for pneumonia or pneumonitis. Dr. Tasia Catchings discussed with pulmonology who recommended treating for pneumonia with doxycycline 100 mg twice a day x 8 days and prednisone 40 mg daily x 5 days.  He says he feels no better or worse since steroids and antibiotics.  He has been seen by pulmonology for same and he continues to use MDI and nebulizer.  Says he does  not notice improvement in his symptoms with use.  Previously had thoracentesis for palliation.  Says initially he noticed improvement but with most recent thoracentesis no improvement. Endorses intermittent cough.  Shortness of breath occurs with activity.  Minimal symptoms at rest.  Uses wheelchair if ambulation moderate distances.  Not on oxygen.  No fevers or chills.  Appetite is good.  No neurologic complaints.  No chest pain.  He denies nausea, vomiting, constipation, or diarrhea.  He denies urinary complaints.  He offers no further specific complaints today.  Denies exposure to coronavirus.    ECOG FS:2 - Symptomatic, <50% confined to bed  Review of systems- Review of Systems  Constitutional: Positive for malaise/fatigue (chronic & unchanged). Negative for chills, fever and weight loss.  HENT: Negative for hearing loss, nosebleeds, sore throat and tinnitus.   Eyes: Negative for blurred vision and double vision.  Respiratory: Positive for shortness of breath (chronic & unchanged). Negative for cough, hemoptysis and wheezing.   Cardiovascular: Positive for leg swelling (chronic & unchanged). Negative for chest pain and palpitations.  Gastrointestinal: Negative for abdominal pain, blood in stool, constipation, diarrhea, melena, nausea and vomiting.  Genitourinary: Negative for dysuria and urgency.  Musculoskeletal: Negative for back pain, falls, joint pain and myalgias.  Skin: Negative for itching and rash.  Neurological: Negative for dizziness, tingling, sensory change, loss of consciousness, weakness and headaches.  Endo/Heme/Allergies: Negative for environmental allergies. Bruises/bleeds easily.  Psychiatric/Behavioral: Negative for depression. The patient is not nervous/anxious and does not have insomnia.      Allergies  Allergen Reactions  . Lipitor [Atorvastatin Calcium] Other (See Comments)  Aches.  Tolerated crestor.     Past Medical History:  Diagnosis Date  . Anxiety   .  Arthritis   . CHF (congestive heart failure) ([place])   . Coronary artery disease   . Diabetes mellitus   . Dyslipidemia   . Hx of CABG   . Hyperlipidemia   . Hypertension   . Malignant neoplasm of unspecified part of unspecified bronchus or lung ([place]) 08/2018   Immunotherapy  . Pleural effusion     Past Surgical History:  Procedure Laterality Date  . CATARACT EXTRACTION Left 09/2015  . CHEST TUBE INSERTION Left 10/01/2018   Procedure: INSERTION PLEURAL DRAINAGE CATHETER;  Surgeon: [name], MD;  Location: [facility];  Service: Thoracic;  Laterality: Left;  . CORONARY ARTERY BYPASS GRAFT  2004   (CABG with LIMA to the  LAD, SVG to OM2/OM3, SVG  to diag  . DIALYSIS/PERMA CATHETER INSERTION N/A 03/14/2019   Procedure: DIALYSIS/PERMA CATHETER INSERTION;  Surgeon: Algernon Huxley, MD;  Location: [facility];  Service: Cardiovascular;  Laterality: N/A;  . DIALYSIS/PERMA CATHETER REMOVAL N/A 05/28/2019   Procedure: DIALYSIS/PERMA CATHETER REMOVAL;  Surgeon: Katha Cabal, MD;  Location: [facility];  Service: Cardiovascular;  Laterality: N/A;  . Left ankle surgery     repair of fracture  . Right lower leg surgery     rod  . TEMPORARY DIALYSIS CATHETER N/A 03/11/2019   Procedure: TEMPORARY DIALYSIS CATHETER;  Surgeon: Algernon Huxley, MD;  Location: [facility];  Service: Cardiovascular;  Laterality: N/A;    Social History   Socioeconomic History  . Marital status: Married    Spouse name: Not on file  . Number of children: Not on file  . Years of education: Not on file  . Highest education level: Not on file  Occupational History  . Not on file  Tobacco Use  . Smoking status: Former Smoker    Packs/day: 0.20    Years: 45.00    Pack years: 9.00    Types: Cigarettes    Quit date: 03/05/2019    Years since quitting: 0.4  . Smokeless tobacco: Former Systems developer    Types: Snuff  Vaping Use  . Vaping Use: Never used  Substance and Sexual Activity  . Alcohol use:  Not Currently    Alcohol/week: 6.0 standard drinks    Types: 6 Cans of beer per week    Comment: occ, average 6 pack in a week  . Drug use: No  . Sexual activity: Not Currently  Other Topics Concern  . Not on file  Social History Narrative   On disability 2009 after prev injuries and CAD.     Married 1976   2 kids, 4 grandkids.    Social Determinants of Health   Financial Resource Strain:   . Difficulty of Paying Living Expenses:   Food Insecurity:   . Worried About Charity fundraiser in the Last Year:   . Arboriculturist in the Last Year:   Transportation Needs:   . Film/video editor (Medical):   [name] Lack of Transportation (Non-Medical):   Physical Activity:   . Days of Exercise per Week:   . Minutes of Exercise per Session:   Stress:   . Feeling of Stress :   Social Connections:   . Frequency of Communication with Friends and Family:   . Frequency of Social Gatherings with Friends and Family:   . Attends Religious Services:   .  Active Member of Clubs or Organizations:   . Attends Archivist Meetings:   [name] Marital Status:   Intimate Partner Violence:   . Fear of Current or Ex-Partner:   . Emotionally Abused:   [name] Physically Abused:   . Sexually Abused:     Family History  Problem Relation Age of Onset  . Dementia Mother   . Heart disease Father   . Colon cancer Neg Hx   . Prostate cancer Neg Hx   . Diabetes Neg Hx      Current Outpatient Medications:  .  albuterol (VENTOLIN HFA) 108 (90 Base) MCG/ACT inhaler, Inhale 2 puffs into the lungs every 6 (six) hours as needed for wheezing or shortness of breath., Disp: 18 g, Rfl: 0 .  amiodarone (PACERONE) 200 MG tablet, TAKE 1 TABLET BY MOUTH EVERY DAY, Disp: 90 tablet, Rfl: 0 .  apixaban (ELIQUIS) 5 MG TABS tablet, Take 1 tablet (5 mg total) by mouth 2 (two) times daily., Disp: 60 tablet, Rfl: 5 .  clopidogrel (PLAVIX) 75 MG tablet, TAKE 1 TABLET BY MOUTH EVERY DAY, Disp: 90 tablet, Rfl: 1 .   dextromethorphan-guaiFENesin (MUCINEX DM) 30-600 MG 12hr tablet, Take 1 tablet by mouth 2 (two) times daily., Disp:  , Rfl:  .  doxycycline (VIBRA-TABS) 100 MG tablet, Take 1 tablet (100 mg total) by mouth 2 (two) times daily., Disp: 6 tablet, Rfl: 0 .  ezetimibe (ZETIA) 10 MG tablet, Take 1 tablet (10 mg total) by mouth daily., Disp: 30 tablet, Rfl: 5 .  ferrous sulfate 325 (65 FE) MG tablet, TAKE 1 TABLET (325 MG TOTAL) BY MOUTH 2 (TWO) TIMES DAILY WITH A MEAL., Disp: 180 tablet, Rfl: 0 .  folic acid (FOLVITE) 1 MG tablet, Take 1 tablet (1 mg total) by mouth daily. (Patient taking differently: Take 400 mcg by mouth daily. ), Disp:  , Rfl:  .  furosemide (LASIX) 40 MG tablet, Take 40 mg by mouth daily., Disp: , Rfl:  .  HUMALOG 100 UNIT/ML injection, INJECT (20 UNITS TOTAL) INTO THE SKIN 3 (THREE) TIMES DAILY WITH MEALS., Disp: 20 mL, Rfl: 2 .  insulin aspart (NOVOLOG) 100 UNIT/ML injection, Inject 0-8 Units into the skin as directed. Take 0 units if CBG 70-150 Take 1 units if CBG 151-200 Take 2 units if CBG 201-250 Take 3 units if CBG 251-300 Take 4 units if CBG 301-350 Take 6 units if CBG 351-400 If CBG > 400, give 8 units and call MD, Disp: 10 mL, Rfl:  .  insulin detemir (LEVEMIR) 100 UNIT/ML injection, Inject 0.2 mLs (20 Units total) into the skin at bedtime., Disp: , Rfl:  .  metoprolol succinate (TOPROL-XL) 25 MG 24 hr tablet, Take 0.5 tablets (12.5 mg total) by mouth daily., Disp: 45 tablet, Rfl: 1 .  Multiple Vitamin (MULTIVITAMIN WITH MINERALS) TABS tablet, Take 1 tablet by mouth at bedtime., Disp: , Rfl:  .  multivitamin (RENA-VIT) TABS tablet, Take 1 tablet by mouth at bedtime., Disp: , Rfl: 0 .  oxyCODONE (OXY IR/ROXICODONE) 5 MG immediate release tablet, Take 1 tablet (5 mg total) by mouth every 8 (eight) hours as needed for severe pain., Disp: 60 tablet, Rfl: 0 .  predniSONE (DELTASONE) 20 MG tablet, Take 2 tablets (40 mg total) by mouth daily with breakfast., Disp: 8 tablet, Rfl: 0 .   promethazine (PHENERGAN) 25 MG tablet, Take 1 tablet (25 mg total) by mouth every 6 (six) hours as needed for nausea or vomiting., Disp: 60 tablet,  Rfl: 0 .  rosuvastatin (CRESTOR) 10 MG tablet, TAKE 1 TABLET (10 MG TOTAL) BY MOUTH DAILY., Disp: 90 tablet, Rfl: 3 .  DULoxetine (CYMBALTA) 30 MG capsule, Take 1 capsule (30 mg total) by mouth daily., Disp: 30 capsule, Rfl: 0  Physical exam:  Vitals:   07/29/19 0911  BP: (!) 102/55  Pulse: 87  Resp: 18  Temp: (!) 96.6 F (35.9 C)  SpO2: 98%  Weight: 188 lb 3.2 oz (85.4 kg)   Physical Exam Vitals and nursing note reviewed.  Constitutional:      General: He is not in acute distress.    Appearance: He is well-developed.     Comments: Accompanied by family. Thin build, in wheelchair  HENT:     Head: Normocephalic and atraumatic.     Nose: Nose normal.     Mouth/Throat:     Pharynx: No oropharyngeal exudate.  Eyes:     General: No scleral icterus.    Conjunctiva/sclera: Conjunctivae normal.  Cardiovascular:     Rate and Rhythm: Normal rate and regular rhythm.     Heart sounds: Normal heart sounds.  Pulmonary:     Effort: Pulmonary effort is normal.     Breath sounds: Examination of the left-upper field reveals decreased breath sounds. Examination of the left-lower field reveals decreased breath sounds. Decreased breath sounds present. No wheezing, rhonchi or rales.  Abdominal:     General: Bowel sounds are normal. There is no distension.     Palpations: Abdomen is soft.     Tenderness: There is no abdominal tenderness.  Musculoskeletal:        General: No deformity. Normal range of motion.     Cervical back: Normal range of motion and neck supple.     Right lower leg: Edema present.     Left lower leg: Edema present.  Skin:    General: Skin is warm and dry.     Findings: Bruising present.  Neurological:     Mental Status: He is alert and oriented to person, place, and time.     Motor: No weakness.  Psychiatric:        Mood  and Affect: Mood normal.        Behavior: Behavior normal.      CMP Latest Ref Rng & Units 07/10/2019  Glucose 70 - 99 mg/dL 120(H)  BUN 8 - 23 mg/dL 48(H)  Creatinine 0.61 - 1.24 mg/dL 2.14(H)  Sodium 135 - 145 mmol/L 136  Potassium 3.5 - 5.1 mmol/L 4.6  Chloride 98 - 111 mmol/L 100  CO2 22 - 32 mmol/L 26  Calcium 8.9 - 10.3 mg/dL 8.2(L)  Total Protein 6.5 - 8.1 g/dL 6.9  Total Bilirubin 0.3 - 1.2 mg/dL 0.7  Alkaline Phos 38 - 126 U/L 408(H)  AST 15 - 41 U/L 35  ALT 0 - 44 U/L 26   CBC Latest Ref Rng & Units 07/10/2019  WBC 4.0 - 10.5 K/uL 8.0  Hemoglobin 13.0 - 17.0 g/dL 10.9(L)  Hematocrit 39 - 52 % 32.2(L)  Platelets 150 - 400 K/uL 294    No images are attached to the encounter.  DG Chest 2 View  Result Date: 07/29/2019 CLINICAL DATA:  Cough EXAM: CHEST - 2 VIEW COMPARISON:  06/19/2019 FINDINGS: Prior CABG. Heart is normal size. Continued bilateral pleural effusions versus pleural thickening, left greater than right. Improved patchy opacity in the left mid and lower lung. Increasing density in the right mid lung and right base. Underlying chronic lung disease  suspected. IMPRESSION: Stable bilateral pleural effusions or pleural thickening, left greater than right. Improving left mid lung and lower lung airspace disease with worsening right mid and lower lung airspace opacities. Findings concerning for pneumonia superimposed on chronic lung disease. Electronically Signed   By: Rolm Baptise M.D.   On: 07/29/2019 08:50   MR Brain Wo Contrast  Result Date: 07/12/2019 CLINICAL DATA:  Lung cancer staging EXAM: MRI HEAD WITHOUT CONTRAST TECHNIQUE: Multiplanar, multiecho pulse sequences of the brain and surrounding structures were obtained without intravenous contrast. COMPARISON:  03/05/2019 FINDINGS: Brain: No swelling or masslike finding disease. The patient has end-stage renal disease. Cerebral volume loss and mild periventricular and pontine chronic small vessel ischemia. No recent  infarct, hydrocephalus, collection, or shift. Vascular: Normal flow voids Skull and upper cervical spine: No focal lesion is noted. Degenerative facet spurring. Sinuses/Orbits: Chronic right maxillary sinusitis with mucosal thickening and atelectasis. Left cataract resection. IMPRESSION: No evidence of metastatic disease. Electronically Signed   By: Monte Fantasia M.D.   On: 07/12/2019 09:59   NM PET Image Restag (PS) Skull Base To Thigh  Result Date: 07/18/2019 CLINICAL DATA:  Subsequent treatment strategy for lung carcinoma. Immunotherapy ongoing. EXAM: NUCLEAR MEDICINE PET SKULL BASE TO THIGH TECHNIQUE: 10.9 mCi F-18 FDG was injected intravenously. Full-ring PET imaging was performed from the skull base to thigh after the radiotracer. CT data was obtained and used for attenuation correction and anatomic localization. Fasting blood glucose: 113 mg/dl COMPARISON:  PET-CT 09/18/2018 chest CT 03/14/2019. FINDINGS: Mediastinal blood pool activity: SUV max 2.38 Liver activity: SUV max NA NECK: No hypermetabolic lymph nodes in the neck. Incidental CT findings: none CHEST: No hypermetabolic mediastinal or hilar nodes. No suspicious pulmonary nodules on the CT scan. Incidental CT findings: Dense airspace disease in the right lower lobe. More diffuse airspace disease throughout the lungs. Moderate right effusion similar to prior. ABDOMEN/PELVIS: No abnormal hypermetabolic activity within the liver, pancreas, adrenal glands, or spleen. No hypermetabolic lymph nodes in the abdomen or pelvis. Incidental CT findings: none SKELETON: No focal hypermetabolic activity to suggest skeletal metastasis. Incidental CT findings: none IMPRESSION: 1.  No evidence of lung cancer recurrence or metastatic disease. 2. Dense airspace disease in the right lower lobe on the background of more diffuse airspace disease. Recommend clinical correlation for pneumonia or immunotherapy related pneumonitis These results will be called to the  ordering clinician or representative by the Radiologist Assistant, and communication documented in the PACS or [employer]. Electronically Signed   By: Suzy Bouchard M.D.   On: 07/18/2019 14:13   DG Shoulder Left  Result Date: 07/12/2019 CLINICAL DATA:  Left shoulder pain EXAM: LEFT SHOULDER - 2+ VIEW COMPARISON:  Chest radiograph dated 06/19/2019 FINDINGS: There is no evidence of fracture or dislocation. There is no evidence of arthropathy or other focal bone abnormality. Soft tissues are unremarkable. Median sternotomy wires are noted. IMPRESSION: Negative. Electronically Signed   By: Zerita Boers M.D.   On: 07/12/2019 10:00    Assessment and plan- Patient is a 64 y.o. male diagnosed with metastatic lung cancer presents to symptom management clinic for complaints of shortness of breath.  1.  Shortness of breath-etiology likely multifactoral- infectious vs pneumonitis vs CHF vs malignancy vs others. Chest xray independently reviewed and I agree with findings of worsening right mid and lower lung airspace opacities concerning for pneumonia and worse since PET scan in mid-July. Symptomatically stable. Not hypoxic. Anemia stable/improved. No leukocytosis. Afebrile. BNP 1318, worse though may be elevated in  setting of CKD. Previously BNP around 500 and renal function was worse. Now BNP worse and renal function improved. Weight down. Leg swelling chronic per patient. I've reached out to cardiology for input. Start levaquin (renally dosed for CrCl 34) 500 mg on day one then 250 mg every 24 hours. Will reimage later this week to re-evaluate. If improved, may need extended duration of levaquin. If worse, consider steroids for pneumonitis vs others. Continue albuterol and duonebs as prescribed. Follow up with cardiology, pulmonology, palliative care, and oncology as scheduled.   Return to clinic as scheduled for labs & followup. Plan to re-image later this week.   Visit Diagnosis 1. Exertional dyspnea    2. Pneumonia of right lower lobe due to infectious organism     Patient expressed understanding and was in agreement with this plan. He also understands that He can call clinic at any time with any questions, concerns, or complaints.   Thank you for allowing me to participate in the care of this very pleasant patient.   Beckey Rutter, DNP, AGNP-C [place] [facility] [place]  [place]: Marrianne Mood, [place] [facility]   Spoke to Christell Faith, [place]. He will contact patient to have him seen tomorrow.

## 2019-07-29 NOTE — Progress Notes (Signed)
Patient here with complaints of shortness of breath (02 98% on RA) and cough ( clear mucous production). Pt has chest xray this morning. Pt in No acute distress.

## 2019-07-29 NOTE — Progress Notes (Signed)
Office Visit    Patient Name: Craig Koch Date of Encounter: 07/30/2019  Primary Care Provider:  Tonia Ghent, MD Primary Cardiologist:  Ida Rogue, MD  Chief Complaint    Chief Complaint  Patient presents with  . OTHER    Follow up Xray results and medications verbally reviewed with patient    64 yo male with history of CAD s/p four-vessel CABG, HFrEF, A. 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  Past Medical History    Past Medical History:  Diagnosis Date  . Anxiety   . Arthritis   . CHF (congestive heart failure) ([place])   . Coronary artery disease   . Diabetes mellitus   . Dyslipidemia   . Hx of CABG   . Hyperlipidemia   . Hypertension   . Malignant neoplasm of unspecified part of unspecified bronchus or lung ([place]) 08/2018   Immunotherapy  . Pleural effusion    Past Surgical History:  Procedure Laterality Date  . CATARACT EXTRACTION Left 09/2015  . CHEST TUBE INSERTION Left 10/01/2018   Procedure: INSERTION PLEURAL DRAINAGE CATHETER;  Surgeon: [name], MD;  Location: [facility] ORS;  Service: Thoracic;  Laterality: Left;  . CORONARY ARTERY BYPASS GRAFT  2004   (CABG with LIMA to the  LAD, SVG to OM2/OM3, SVG  to diag  . DIALYSIS/PERMA CATHETER INSERTION N/A 03/14/2019   Procedure: DIALYSIS/PERMA CATHETER INSERTION;  Surgeon: Algernon Huxley, MD;  Location: [place] [facility];  Service: Cardiovascular;  Laterality: N/A;  . DIALYSIS/PERMA CATHETER REMOVAL N/A 05/28/2019   Procedure: DIALYSIS/PERMA CATHETER REMOVAL;  Surgeon: Katha Cabal, MD;  Location: [place] [facility];  Service: Cardiovascular;  Laterality: N/A;  . Left ankle surgery     repair of fracture  . Right lower leg surgery     rod  . TEMPORARY DIALYSIS CATHETER N/A 03/11/2019   Procedure: TEMPORARY DIALYSIS CATHETER;   Surgeon: Algernon Huxley, MD;  Location: [place] [facility];  Service: Cardiovascular;  Laterality: N/A;    Allergies  Allergies  Allergen Reactions  . Lipitor [Atorvastatin Calcium] Other (See Comments)    Aches.  Tolerated crestor.     History of Present Illness    Craig Koch is a 64 y.o. male with PMH as above.  He has history of 2010 stress test with EF 45% and fixed defect involving the inferior wall, most consistent with scar versus hibernating myocardium. Per patient, he also had a stress test in 2012 ruled normal. He is a current smoker at 1/2 pack daily.   He was admitted to [facility] 08/2018 with large pleural effusion and underwent thoracentesis. 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 He has reportedly been followed by oncology and responded well to treatment thus far.   He was seen in the office 09/25/2017 and denied chest pain.  Recommendations were to continue current medication regimen. Weight loss, dietary changes, and smoking cessation were advised.   He seen at [facility] 03/03/19 after presenting with SOB and found to be in new onset Afib with RVR, AKI requiring HD, NSTEMI, and new HFrEF. It was thought his Afib with RVR was in the setting of underlying metabolic abnormalities.  Rate control was limited by blood pressure.  He was started on amiodarone with improvement in ventricular rate.  He also received several  doses of digoxin; however, this was discontinued due to his renal failure.  High-sensitivity troponin elevated greater than 27,000 with suspicion for graft occlusion.  Echo showed moderately reduced LVSF.  He was continued on medical therapy and heparin given his renal function and cath deferred.  He had severe hyperglycemia noted as well as hyponatremia.  Temporary dialysis catheter was placed and removed by the end of admission with tunneled right IJ hemodialysis catheter placed.  He was discharged 03/19/19 on amiodarone 200 mg daily, apixaban 5 mg  twice daily (not actually prescribed at discharge), ASA 81 mg daily (changed to Plavix at discharge), rosuvastatin, and Toprol. ACE/ARB/ARNI was not added given his soft BP and renal function.   When seen s/p discharge, he was walking regularly. He was following closely with oncology and the HD team. He was not on [facility] with Eliquis, as this prescription was discontinued at discharge for unclear reasons.  [place] restarted. He was down to 1 cigarette per day.   When last seen in clinic 5/21, he noted intermittent dizziness and dyspnea on exertion that occur only with ambulation and he did not feel were connected with his lower BP. He had weight gain, attributed to caloric intake and with clinic weights 176lbs  181lbs. He had PND and shortness of breath that occured sometimes when in bed and woke him from sleep; however, this resolved within minutes, allowing him to again lay back down for the night without additional pillows or any other clear alleviating factors. He was often fatigued. He was not aware of any h/o OSA.  He was looking forward to having his IJ HD catheter removed. TSH lab obtained by oncology and noted to be elevated though improved from previous TSH and with amiodarone continued after discussion with primary cardiologist.   Following nephrology removal of catheter, he was started on lasix, escalated to lasix 2m every other day. [place] in [place]. At that time, it was noted his CKD was stable with recent EGFR 39 and recovery from earlier AKI noted. LEE 2+ on exam with suspected multifactorial etiology, including hypoalbuminemia. Protein intake was encouraged. Furosemide continued every other day. Hemoglobin noted to be 11.2 with stable Hct without indication for Procrit at that time. Secondary hyperparathyroidism was noted to be improved and recommendation to continue monitoring. Repeat labs as transcribed in designated section below.  He was seen 5/26 by the heart failure  clinic with furosemide continued.   On 7/26, he was seen by oncology for follow-up of stage IV metastatic lung adenocarcinoma. He reported ongoing dyspnea and shortness of breath. It was noted 07/18/2019 PET showed no evidence of lung cancer recurrence or metastatic disease. Dense airspace disease in RLL was concerning for pneumonia or pneumoniitis. Discussed with Dr. YTasia Catchingsand recommendation was for treating pneumonia. He denied relief with doxycycline and prednisone. He was seen by pulmonology and started on nebulizer without improvement. He also noted intermittent cough. Chest x-ray obtained 7/26 with concern for worsening right mid and lower lung airspace opacities. BNP 1318. He was referred back to cardiology for reassessment of volume status and started on Levaquin, renally dosed given his creatinine. Plan was to reimage later in the week to reevaluate.  Today, 07/31/19, he denies chest pain, racing heart rate, or palpitations. He reports dyspnea when moving (within 4 to 5 minutes of moving) and shortness of breath that wakes him from sleep as reported at previous appointments. He actually notes an improvement in his dyspnea since his last visit;  however, he has noted an increase in swelling that he feels may be greater than his previous appointment. He states he used to not be able to walk across the kitchen; however, he can now walk for at least a few minutes. He denies n, v, dizziness, syncope, weight gain, or early satiety. He continues to smoke and admits that he has increased to 3 cigarettes/day. He does plan to start nicotine patches through his PCP. He continues on anticoagulation with Eliquis 5 mg twice daily. He notes melena, attributed to iron supplementation. He otherwise denies any signs or symptoms of bleeding. He notes medication compliance. He is looking forward to possible stopping his oncology infusions in the near future, as he was told his upcoming treatment may be his last after his most  recent imaging as outlined above. He has not yet started his antibiotic due to concern regarding the research side effects and effects on his renal function. He asks for recommendations from a cardiac standpoint regarding his Levaquin.  Home Medications    Prior to Admission medications   Medication Sig Start Date End Date Taking? Authorizing Provider  acetaminophen (TYLENOL) 325 MG tablet Take 2 tablets (650 mg total) by mouth every 6 (six) hours as needed for mild pain or fever. 03/19/19   Ezekiel Slocumb, DO  albuterol (VENTOLIN HFA) 108 (90 Base) MCG/ACT inhaler Inhale 2 puffs into the lungs every 6 (six) hours as needed for wheezing or shortness of breath. 04/01/19 05/08/19  Wyvonnia Dusky, MD  amiodarone (PACERONE) 200 MG tablet Take 1 tablet (200 mg total) by mouth daily. 04/23/19 05/23/19  Tonia Ghent, MD  clopidogrel (PLAVIX) 75 MG tablet Take 1 tablet (75 mg total) by mouth daily. 04/23/19 05/23/19  Tonia Ghent, MD  dextromethorphan-guaiFENesin [facility] DM) 30-600 MG 12hr tablet Take 1 tablet by mouth 2 (two) times daily. 03/19/19   Ezekiel Slocumb, DO  dronabinol (MARINOL) 5 MG capsule Take 1 capsule (5 mg total) by mouth 2 (two) times daily before a meal. 04/17/19 05/17/19  Earlie Server, MD  DULoxetine (CYMBALTA) 30 MG capsule Take 1 capsule (30 mg total) by mouth daily. 04/01/19 05/08/19  Wyvonnia Dusky, MD  Ensure Max Protein (ENSURE MAX PROTEIN) LIQD Take 330 mLs (11 oz total) by mouth 2 (two) times daily between meals. 03/19/19   Nicole Kindred A, DO  ferrous sulfate 325 (65 FE) MG tablet Take 1 tablet (325 mg total) by mouth 2 (two) times daily with a meal. 04/23/19 05/23/19  Tonia Ghent, MD  folic acid (FOLVITE) 1 MG tablet Take 1 tablet (1 mg total) by mouth daily. 03/20/19   Nicole Kindred A, DO  insulin aspart (NOVOLOG) 100 UNIT/ML injection Inject 0-8 Units into the skin as directed. Take 0 units if CBG 70-150 Take 1 units if CBG 151-200 Take 2 units if CBG  201-250 Take 3 units if CBG 251-300 Take 4 units if CBG 301-350 Take 6 units if CBG 351-400 If CBG > 400, give 8 units and call MD 04/15/19   Tonia Ghent, MD  insulin detemir (LEVEMIR) 100 UNIT/ML injection Inject 0.1-0.15 mLs (10-15 Units total) into the skin at bedtime. 04/12/19   Tonia Ghent, MD  levofloxacin (LEVAQUIN) 250 MG tablet Take 2 tablets (568m) by mouth x 1 day and then take 1 tablet (2561m by mouth daily x 6 days 05/02/19   Borders, JoKirt BoysNP  metoprolol succinate (TOPROL-XL) 25 MG 24 hr tablet Take 0.5 tablets (12.5 mg  total) by mouth daily. 04/15/19   Tonia Ghent, MD  multivitamin (RENA-VIT) TABS tablet Take 1 tablet by mouth at bedtime. 03/19/19   Ezekiel Slocumb, DO  polyethylene glycol (MIRALAX / GLYCOLAX) 17 g packet Take 17 g by mouth daily. 03/20/19   Ezekiel Slocumb, DO  promethazine (PHENERGAN) 25 MG tablet Take 1 tablet (25 mg total) by mouth every 6 (six) hours as needed for nausea or vomiting. 04/08/19   Earlie Server, MD  rosuvastatin (CRESTOR) 10 MG tablet TAKE 1 TABLET (10 MG TOTAL) BY MOUTH DAILY. 01/07/19   Tonia Ghent, MD  simethicone (MYLICON) 80 MG chewable tablet Chew 1 tablet (80 mg total) by mouth 4 (four) times daily as needed for flatulence. 03/19/19   Nicole Kindred A, DO  thiamine 100 MG tablet Take 1 tablet (100 mg total) by mouth daily. 03/20/19   Ezekiel Slocumb, DO    Review of Systems    He denies chest pain, palpitations, true orthopnea, n, v, syncope, wt gain, or early satiety. He reports improved yet ongoing DOE with ambulation, PND / SOB with laying in bed but not consistent with orthopnea. He reports fatigue. He reports ongoing LEE that may be increased from his previous visit. He reports chronic melena due to iron supplementation and not due to bleeding. All other s/sx of bleeding denied.  All other systems reviewed and are otherwise negative except as noted above.  Physical Exam    VS:  Ht 6' (1.829 m)   Wt 186 lb (84.4 kg)    BMI 25.23 kg/m  , BMI Body mass index is 25.23 kg/m. GEN: Frail and elderly male, seated at table (previously seen in wheelchair).  HEENT: normal. Neck: Supple, no JVD, L sided carotid bruit, no masses. Cardiac: RRR, 1/6 systolic murmur. No rubs or gallops. No clubbing, cyanosis. 1+ bilateral edema.  Radials/DP/PT 1+ and equal bilaterally.  Respiratory:  Respirations regular and unlabored, bibasilar decreased bilateral breath sounds. GI: Soft, nontender, nondistended, BS + x 4. MS: no deformity or atrophy. Skin: warm and dry, no rash. Neuro:  Strength and sensation are intact. Psych: Normal affect.  Accessory Clinical Findings    ECG personally reviewed by me today -NSR, 93 bpm (elevated 16 bpm from previous tracing), previously noted inferior lateral and anterior infarcts, IVCD with  QRS 186m, LAD, prolonged QTC 512 ms- no acute changes.  VITALS Reviewed today   Temp Readings from Last 3 Encounters:  07/29/19 (!) 96.6 F (35.9 C)  07/10/19 (!) 96.8 F (36 C)  06/19/19 97.6 F (36.4 C)   BP Readings from Last 3 Encounters:  07/29/19 (!) 102/55  07/10/19 112/69  06/19/19 124/72   Pulse Readings from Last 3 Encounters:  07/29/19 87  07/10/19 77  06/19/19 84    Wt Readings from Last 3 Encounters:  07/30/19 186 lb (84.4 kg)  07/29/19 188 lb 3.2 oz (85.4 kg)  07/10/19 196 lb (88.9 kg)    At last clinic visit, wt 181 lb  LABS  reviewed today   [place] and most recent? Yes/No: Yes   [place] labs transcribed below: 7/22 parathyroid hormone: 15,  glucose 270 BUN 52, creatinine 1.79 sodium 140, potassium 4.4, CO2 27, chloride 105, calcium 9.2, phosphorus 2.4,  albumin 2.9,  WBC 8.9, RBC 4.16, hemoglobin 12.3, hematocrit 38.9, platelets 290  Lab Results  Component Value Date   WBC 8.5 07/29/2019   HGB 12.3 (L) 07/29/2019   HCT 37.9 (L) 07/29/2019  MCV 90.7 07/29/2019   PLT 240 07/29/2019   Lab Results  Component Value Date    CREATININE 2.00 (H) 07/29/2019   BUN 49 (H) 07/29/2019   NA 139 07/29/2019   K 4.4 07/29/2019   CL 102 07/29/2019   CO2 28 07/29/2019   Lab Results  Component Value Date   ALT 36 07/29/2019   AST 34 07/29/2019   ALKPHOS 394 (H) 07/29/2019   BILITOT 0.8 07/29/2019   Lab Results  Component Value Date   CHOL 85 (L) 05/24/2019   HDL 41 05/24/2019   LDLCALC 32 05/24/2019   LDLDIRECT 31 05/24/2019   TRIG 47 05/24/2019   CHOLHDL 2.1 05/24/2019    Lab Results  Component Value Date   HGBA1C 7.2 (A) 05/14/2019   Lab Results  Component Value Date   TSH 5.284 (H) 07/10/2019     STUDIES/PROCEDURES reviewed today   2D echo 03/04/2019: 1. Left ventricular ejection fraction, by estimation, is 30 to 35%. The  left ventricle has moderately decreased function. The left ventricle  demonstrates global hypokinesis. Left ventricular diastolic parameters are  indeterminate.  2. Right ventricular systolic function is normal. The right ventricular  size is normal. Tricuspid regurgitation signal is inadequate for assessing  PA pressure.  3. Left atrial size was mild to moderately dilated.  4. The mitral valve is normal in structure and function. Moderate mitral  valve regurgitation. No evidence of mitral stenosis.  5. The aortic valve is normal in structure and function. Aortic valve  regurgitation is trivial. Mild to moderate aortic valve  sclerosis/calcification is present, without any evidence of aortic  stenosis.  6. The inferior vena cava is normal in size with greater than 50%  respiratory variability, suggesting right atrial pressure of 3 mmHg.   Assessment & Plan   Abnormal CXR (referred to Cardiology by Oncology) Chronic DOE / Chronic Hypoxia --Chronic DOE/dizziness with ambulation, though noted to be improving. Multifactorial etiology likely given h/o adenocarcinoma, COPD, CAD, HFrEF, PAF, DM2, anemia/hypoalbuminemia, hypotension, abnl TSH.  Recent doxycycline, steroids,  nebs. Most relief reported s/p recent repeat thoracentesis. Followed by pulmonology, oncology, cardiology, and nephrology as detailed here:  Pulmonology: Thoracentesis 05/2019 and repeated 06/2019. L hydro-pneumothorax. Referral to oncology placed at last visit.   Oncology: Visit per pulmonology request -CXR and concern for PNA. Prescribed Levaquin. Referred to Cardiology for volume concerns given BNP elevation (in setting of comorbids).  Nephrology: PermCath removed and subsequent start of Lasix 4m qd 06/2019, recently decreased 7/22 to every other day dosing with notes to call if increased LEE.   Cardiology: Agree with suspicion that albumin could be contributing to LEE, as outlined in nephrology note. Considered also is ongoing anemia and dependent edema as discussed today. BNP likely elevated in setting of recent effusion and PNA as well. Given known low EF, ROS / exam today, and overall pt picture, however, reasonable to temporarily increase diuresis for 3-5 days as do suspect increased volume status since last seen in office. --Increase to lasix 49mqd for 3-5 days then drop back down to every other day furosemide 403m Recheck BMET in 1 week. Loop in nephrology. Slow changes in position given h/o orthostatic hypotension. Discuss ongoing amiodarone with primary cardiologist (amiodarone lung considered as well). Smoking cessation advised as outlined below.   Paroxysmal atrial fibrillation --Maintaining NSR on amiodarone 200m59mily. Rate controlled on current dose BB. TSH still elevated with ongoing abnormal CXR - will again discuss ongoing amiodarone tx with primary cardiologist (Discussed  elevated TSH at last visit. Considered is amiodarone lung). Continue Eliquis 27m BID. No s/sx of bleeding with melena chronic and in setting of iron supplementation.     Abnormal TSH  / Abnormal CXR --As above. Further recommendations if needed s/p amiodarone tx discussion with primary cardiologist.    Hypotension --Previously asx with even lower SBP in clinic. H/o orthostatic hypotension in past with ongoing recommendation for slow position changes, especially with temporary increase in diuresis. Escalation of GDMT precluded by BP. Consider that may have room in BP if discontinue amiodarone in the future.  CAD with CABG x4 --No anginal sx reported. Considered is DOE as in part his anginal equivalent; however, also considered are his comorbids. Not a candidate for cath / PCI as noted in past. Continue Eliquis 547mBID in place of ASA 8161mContinue Plavix 74m29m, BB as BP allows, rosuvastatin, and Zetia. Escalation of GDMT with ACE/ARB/ARNI precluded by hypotension.   Acute on chronic HFrEF Chronic lower extremity edema --Ongoing DOE. Started on furosemide s/p IJ HD removal.  Echo as above with EF 30-35%. BNP elevated with consideration of lung dz / PNA. LEE on exam with recently decreased furosemide 40mg74mevery other day. Increase back to furosemide 40mg 32my x3-5 days then drop back to baseline dosing with repeat BMET and loop in CCK. Consider that anemia, hypoalbuminemia, dependent anemia likely contributing to LEE. Continue Toprol. GDMT limited by hypotension.    CKD --Followed by CCK -started on Lasix as above. Given plan to increase from every other day dosing of furosemide 40mg t93mrosemide 40mg da33mfor 3-5 days, will reach out to nephrology to discuss increase of diuretic with repeat BMET.    Anemia --H&H stable and denies any signs of bleeding other than melena on iron supplementation. Consider anemia as contributing to LEE and fatigue / DOE / dizziness. No indication for Procrit per CCK. Continue current iron.  Hypoalbuminemia --Consider as contributing to his symptoms of LEE and fatigue.   HLD --Continue current statin and Zetia. Most recent 03/04/19 LDL 84 and above goal of <70. Reported statin intolerance. Continue current Crestor 10mg and46mia 10mg dail55mday with a  recheck of  LDL and LFTs at RTC (to ensure at goal, given recent start of Zetia).   L sided carotid bruit --Per patient preference, US of caro[place]oreads deferred at previous visit. Reassess at RTC.  Continue current antiplatelet therapy and statin.   DM2, poorly controlled --Recommend glycemic control for risk factor modification.  Stage IV lung CA --Followed closely by oncology and pulmonology with regular blood work. Thoracentesis Smoking cessation advised. Encouraged obtaining nicotine patch, given increased to 3 cigarettes daily when compared with reported 1 / day at previous visit.   Current smoker --Currently smoking 3 cigarette daily, increased from 1 cigarette daily at last visit. Smoking cessation advised.  Will get nicotine patches from PCP.   Secondary hyperparathyroidism --Per CCK, no phosphorus binder recommended at this time. Defer to CCK.   Medication changes:Temporary increase to Lasix 40mg daily56m days then back down to Lasix 40mg every 46mr day. Pending recheck of BMET and discussion with primary cardiologist and CCK.  Labs ordered:BMET. Studies / Imaging ordered:None.  Future considerations:If BP allows, escalate GDMT. Further plans regarding amiodarone / oral diuretic if needed pending discussion with current care team. Recheck LDL/ LFTs at RTC. Based on LDL, consider PCSK9i versus lipid clinic if not at goal s/p Zetia addition and given statin intolerance. Disposition:RTC as scheduled.   JacquelynMalachi Bonds  Cloyde Reams, PA-C 07/30/2019

## 2019-07-30 ENCOUNTER — Ambulatory Visit (INDEPENDENT_AMBULATORY_CARE_PROVIDER_SITE_OTHER): Payer: Medicare Other | Admitting: Physician Assistant

## 2019-07-30 ENCOUNTER — Encounter: Payer: Self-pay | Admitting: Physician Assistant

## 2019-07-30 VITALS — BP 122/70 | HR 93 | Ht 72.0 in | Wt 186.0 lb

## 2019-07-30 DIAGNOSIS — I5022 Chronic systolic (congestive) heart failure: Secondary | ICD-10-CM | POA: Diagnosis not present

## 2019-07-30 DIAGNOSIS — I2510 Atherosclerotic heart disease of native coronary artery without angina pectoris: Secondary | ICD-10-CM | POA: Diagnosis not present

## 2019-07-30 DIAGNOSIS — Z7901 Long term (current) use of anticoagulants: Secondary | ICD-10-CM | POA: Diagnosis not present

## 2019-07-30 DIAGNOSIS — I10 Essential (primary) hypertension: Secondary | ICD-10-CM

## 2019-07-30 DIAGNOSIS — C3490 Malignant neoplasm of unspecified part of unspecified bronchus or lung: Secondary | ICD-10-CM

## 2019-07-30 DIAGNOSIS — E1121 Type 2 diabetes mellitus with diabetic nephropathy: Secondary | ICD-10-CM

## 2019-07-30 DIAGNOSIS — E1122 Type 2 diabetes mellitus with diabetic chronic kidney disease: Secondary | ICD-10-CM

## 2019-07-30 DIAGNOSIS — Z794 Long term (current) use of insulin: Secondary | ICD-10-CM

## 2019-07-30 DIAGNOSIS — Z87448 Personal history of other diseases of urinary system: Secondary | ICD-10-CM

## 2019-07-30 DIAGNOSIS — Z951 Presence of aortocoronary bypass graft: Secondary | ICD-10-CM

## 2019-07-30 DIAGNOSIS — I252 Old myocardial infarction: Secondary | ICD-10-CM

## 2019-07-30 DIAGNOSIS — N1832 Chronic kidney disease, stage 3b: Secondary | ICD-10-CM

## 2019-07-30 MED ORDER — FUROSEMIDE 40 MG PO TABS: ORAL_TABLET | ORAL | 2 refills | Status: DC

## 2019-07-30 NOTE — Patient Instructions (Signed)
Medication Instructions:  Your physician has recommended you make the following change in your medication:  1- TAKE Furosemide 40 mg (1 Tablet) by mouth once a day for 5 days, then take 40 mg (1 tablet) by mouth every other day.  *If you need a refill on your cardiac medications before your next appointment, please call your pharmacy*  Lab Work: Your physician recommends that you return for lab work in: 1 week ~ 08/06/19 for BMET.  - Please go to the [facility]. You will check in at the front desk to the right as you walk into the atrium. Valet Parking is offered if needed. - No appointment needed. You may go any day between 7 am and 6 pm.  If you have labs (blood work) drawn today and your tests are completely normal, you will receive your results only by: [name] MyChart Message (if you have MyChart) OR . A paper copy in the mail If you have any lab test that is abnormal or we need to change your treatment, we will call you to review the results.  Testing/Procedures: none   Follow-Up: At [facility], you and your health needs are our priority.  As part of our continuing mission to provide you with exceptional heart care, we have created designated Provider Care Teams.  These Care Teams include your primary Cardiologist (physician) and Advanced Practice Providers (APPs -  Physician Assistants and Nurse Practitioners) who all work together to provide you with the care you need, when you need it.  We recommend signing up for the patient portal called "MyChart".  Sign up information is provided on this After Visit Summary.  MyChart is used to connect with patients for Virtual Visits (Telemedicine).  Patients are able to view lab/test results, encounter notes, upcoming appointments, etc.  Non-urgent messages can be sent to your provider as well.   To learn more about what you can do with MyChart, go to [email or web address].    Your next appointment:   Keep appointment as scheduled.

## 2019-07-31 ENCOUNTER — Inpatient Hospital Stay (HOSPITAL_BASED_OUTPATIENT_CLINIC_OR_DEPARTMENT_OTHER): Payer: Medicare Other | Admitting: Oncology

## 2019-07-31 ENCOUNTER — Encounter: Payer: Self-pay | Admitting: Oncology

## 2019-07-31 ENCOUNTER — Inpatient Hospital Stay: Payer: Medicare Other

## 2019-07-31 ENCOUNTER — Other Ambulatory Visit: Payer: Self-pay

## 2019-07-31 VITALS — BP 114/70 | HR 94 | Temp 96.7°F | Resp 18 | Wt 187.6 lb

## 2019-07-31 DIAGNOSIS — R748 Abnormal levels of other serum enzymes: Secondary | ICD-10-CM | POA: Diagnosis not present

## 2019-07-31 DIAGNOSIS — I509 Heart failure, unspecified: Secondary | ICD-10-CM | POA: Diagnosis not present

## 2019-07-31 DIAGNOSIS — R0600 Dyspnea, unspecified: Secondary | ICD-10-CM

## 2019-07-31 DIAGNOSIS — Z5112 Encounter for antineoplastic immunotherapy: Secondary | ICD-10-CM

## 2019-07-31 DIAGNOSIS — J189 Pneumonia, unspecified organism: Secondary | ICD-10-CM | POA: Diagnosis not present

## 2019-07-31 DIAGNOSIS — C3490 Malignant neoplasm of unspecified part of unspecified bronchus or lung: Secondary | ICD-10-CM | POA: Diagnosis not present

## 2019-07-31 DIAGNOSIS — N184 Chronic kidney disease, stage 4 (severe): Secondary | ICD-10-CM

## 2019-07-31 DIAGNOSIS — D649 Anemia, unspecified: Secondary | ICD-10-CM | POA: Diagnosis not present

## 2019-07-31 DIAGNOSIS — I132 Hypertensive heart and chronic kidney disease with heart failure and with stage 5 chronic kidney disease, or end stage renal disease: Secondary | ICD-10-CM | POA: Diagnosis not present

## 2019-07-31 DIAGNOSIS — R0609 Other forms of dyspnea: Secondary | ICD-10-CM

## 2019-07-31 DIAGNOSIS — M25512 Pain in left shoulder: Secondary | ICD-10-CM | POA: Diagnosis not present

## 2019-07-31 DIAGNOSIS — E1122 Type 2 diabetes mellitus with diabetic chronic kidney disease: Secondary | ICD-10-CM | POA: Diagnosis not present

## 2019-07-31 DIAGNOSIS — Z87891 Personal history of nicotine dependence: Secondary | ICD-10-CM | POA: Diagnosis not present

## 2019-07-31 DIAGNOSIS — E1136 Type 2 diabetes mellitus with diabetic cataract: Secondary | ICD-10-CM | POA: Diagnosis not present

## 2019-07-31 DIAGNOSIS — Z794 Long term (current) use of insulin: Secondary | ICD-10-CM | POA: Diagnosis not present

## 2019-07-31 DIAGNOSIS — Z79899 Other long term (current) drug therapy: Secondary | ICD-10-CM | POA: Diagnosis not present

## 2019-07-31 LAB — CBC WITH DIFFERENTIAL/PLATELET
Abs Immature Granulocytes: 0.05 10*3/uL (ref 0.00–0.07)
Basophils Absolute: 0.1 10*3/uL (ref 0.0–0.1)
Basophils Relative: 1 %
Eosinophils Absolute: 0.1 10*3/uL (ref 0.0–0.5)
Eosinophils Relative: 2 %
HCT: 37.1 % — ABNORMAL LOW (ref 39.0–52.0)
Hemoglobin: 12.2 g/dL — ABNORMAL LOW (ref 13.0–17.0)
Immature Granulocytes: 1 %
Lymphocytes Relative: 6 %
Lymphs Abs: 0.4 10*3/uL — ABNORMAL LOW (ref 0.7–4.0)
MCH: 29.8 pg (ref 26.0–34.0)
MCHC: 32.9 g/dL (ref 30.0–36.0)
MCV: 90.5 fL (ref 80.0–100.0)
Monocytes Absolute: 0.9 10*3/uL (ref 0.1–1.0)
Monocytes Relative: 11 %
Neutro Abs: 6.4 10*3/uL (ref 1.7–7.7)
Neutrophils Relative %: 79 %
Platelets: 245 10*3/uL (ref 150–400)
RBC: 4.1 MIL/uL — ABNORMAL LOW (ref 4.22–5.81)
RDW: 17.3 % — ABNORMAL HIGH (ref 11.5–15.5)
WBC: 7.9 10*3/uL (ref 4.0–10.5)
nRBC: 0 % (ref 0.0–0.2)

## 2019-07-31 LAB — COMPREHENSIVE METABOLIC PANEL
ALT: 27 U/L (ref 0–44)
AST: 25 U/L (ref 15–41)
Albumin: 2.6 g/dL — ABNORMAL LOW (ref 3.5–5.0)
Alkaline Phosphatase: 339 U/L — ABNORMAL HIGH (ref 38–126)
Anion gap: 9 (ref 5–15)
BUN: 38 mg/dL — ABNORMAL HIGH (ref 8–23)
CO2: 26 mmol/L (ref 22–32)
Calcium: 8.2 mg/dL — ABNORMAL LOW (ref 8.9–10.3)
Chloride: 104 mmol/L (ref 98–111)
Creatinine, Ser: 1.99 mg/dL — ABNORMAL HIGH (ref 0.61–1.24)
GFR calc Af Amer: 40 mL/min — ABNORMAL LOW (ref >60)
GFR calc non Af Amer: 34 mL/min — ABNORMAL LOW (ref >60)
Glucose, Bld: 292 mg/dL — ABNORMAL HIGH (ref 70–99)
Potassium: 4 mmol/L (ref 3.5–5.1)
Sodium: 139 mmol/L (ref 135–145)
Total Bilirubin: 1.2 mg/dL (ref 0.3–1.2)
Total Protein: 6.8 g/dL (ref 6.5–8.1)

## 2019-07-31 LAB — T4, FREE: Free T4: 1.22 ng/dL — ABNORMAL HIGH (ref 0.61–1.12)

## 2019-07-31 LAB — TSH: TSH: 4.775 u[IU]/mL — ABNORMAL HIGH (ref 0.350–4.500)

## 2019-07-31 NOTE — Progress Notes (Signed)
Patient reports his SOBr has not improved and the Promethazine does help with the nausea.

## 2019-07-31 NOTE — Progress Notes (Signed)
Hematology/Oncologyfollow up  note [facility] [phone] [phone]) [phone]   Patient Care Team: Tonia Ghent, MD as PCP - General (Family Medicine) Thelma Comp, [place] (Optometry) Telford Nab, RN as Registered Nurse  REFERRING PROVIDER: Tonia Ghent, MD  CHIEF COMPLAINTS/REASON FOR VISIT:  Follow-up for metastatic lung adenocarcinoma  HISTORY OF PRESENTING ILLNESS:   Craig Koch is a  64 y.o.  male with PMH listed below was seen in consultation at the request of  Tonia Ghent, MD  for evaluation of malignant pleural effusion. Patient was recently admitted to [facility] due to large amount of pleural effusion, acute hypoxic respiratory failure. Patient reports feeling shortness of breath and dyspnea on exertion for several weeks and presented to emergency room.  A chest x-ray 08/23/2018 showed a large amount of left pleural effusion 08/24/2018 CT chest abdomen pelvis confirmed large left pleural effusion with near complete collapse of the left lower lobe and extensive volume loss in the lingula and a portion of the left upper lobe. No acute intra-abdominal process.  Cholelithiasis, diverticulitis, coronary atherosclerosis post CABG.  Bilateral renal cortical scarring.  Aortic atherosclerosis.  #08/24/2018 ultrasound guided diagnostic and therapeutic thoracentesis removed 1.8 L of pleural fluid. Patient subsequently felt better and was discharged to follow-up with pulmonology. 09/04/2018 patient followed up with Dr.Aleskerov chest x-ray showed recurrent moderate left pleural effusion.  Patient underwent ultrasound-guided left thoracentesis again and it drained 3 L of fluid. Pleural fluid cytology positive for adenocarcinoma. Patient was referred to me for further evaluation and management.  # PET eft sided pleural hypermetabolic activity  consistent with malignant pleural effusion. Left upper and lower lobe low-level hypermetabolic him  corresponding to areas of airspace and groundglass opacity.  This was felt to be secondary to atelectasis.  Underlying left upper lobe primary bronchogenic carcinoma cannot be excluded. Medial left upper lobe area more focal hypermetabolic area could represent a pleural disease or a site of small left upper lobe primary. No evidence of extra thoracic hypermetabolic metastatic disease.  Left sided hydro-pneumothorax within the pleural air component being new since the prior CT. Cholelithiasis.  #MRI brain showed punctate focus of enhancement within the right parietal lobe cortex suspicious for possible metastasis. Molecular study showed BRAF V600 mutation. 11/16/2018 started on dabrafenib 150 mg twice daily and trametinib 2 mg daily.  He had good response  #Patient was admitted from 03/03/2019-03/19/2019 due to DKA due to uncontrolled diabetes, acute decompensation of CHF left lower lobe pneumonia right pleural effusion status post thoracentesis.  Fluid study was consistent with transudate. AKI secondary to ATN, on hemodialysis Tuesday Thursday and Saturday, atrial fibrillation with RVR, paroxysmal.  Patient was discharged on Eliquis and amiodarone. Patient was readmitted from 03/26/2019-04/01/2019 due to CHF exacerbation, right pleural effusion recurrent.  Status post diagnostic and therapeutic thoracentesis.  Cytology was negative for malignant cells.   INTERVAL HISTORY Craig Koch is a 64 y.o. male who has above history reviewed by me today presents for follow-up of lung cancer Problems and complaints are listed below:  Patient was accompanied by his wife today. 07/29/2019 chest x-ray showed stable bilateral pleural effusion or pleural thickening, left greater than right.  Improving left midline and lower lung airspace disease with worsening right mid and lower lung therapies opacities.  Findings concerning for pneumonia superimposed on chronic lung disease.  Patient was seen by symptom management  clinic.  Patient was started on a course of Levaquin.  Further work-up showed increase of BNP concerning for  acute on chronic CHF Patient finished a short course of prednisone and doxycycline prior to this x-ray.  He does not feel any significant improvement of his breathing. Patient was sent to see cardiology on 07/30/2019.  Patient was recommended to increase Lasix to 40 mg daily for 3 to 5 days and drop back to baseline dosing and repeat BMP.  Lower extremity was considered to be secondary to anemia, hypoalbuminemia, CKD  Today patient reports feeling the same.  No significant improvement or worsening of his breathing symptoms.  Chronic lower extremity swelling no change.   Review of Systems  Constitutional: Positive for fatigue. Negative for appetite change, chills, diaphoresis and fever.  HENT:   Negative for hearing loss, lump/mass, nosebleeds, sore throat and voice change.   Eyes: Negative for eye problems and icterus.  Respiratory: Positive for shortness of breath. Negative for chest tightness, cough, hemoptysis and wheezing.   Cardiovascular: Positive for leg swelling. Negative for chest pain.  Gastrointestinal: Negative for abdominal distention, abdominal pain, blood in stool, diarrhea, nausea and rectal pain.  Endocrine: Negative for hot flashes.  Genitourinary: Negative for bladder incontinence, difficulty urinating, dysuria, frequency, hematuria and nocturia.   Musculoskeletal: Negative for arthralgias, back pain, flank pain, gait problem and myalgias.  Skin: Negative for itching and rash.  Neurological: Negative for dizziness, gait problem, headaches, light-headedness, numbness and seizures.  Hematological: Negative for adenopathy. Bruises/bleeds easily.  Psychiatric/Behavioral: Negative for confusion and decreased concentration. The patient is not nervous/anxious.     MEDICAL HISTORY:  Past Medical History:  Diagnosis Date  . Anxiety   . Arthritis   . CHF (congestive heart  failure) ([place])   . Coronary artery disease   . Diabetes mellitus   . Dyslipidemia   . Hx of CABG   . Hyperlipidemia   . Hypertension   . Malignant neoplasm of unspecified part of unspecified bronchus or lung ([place]) 08/2018   Immunotherapy  . Pleural effusion     SURGICAL HISTORY: Past Surgical History:  Procedure Laterality Date  . CATARACT EXTRACTION Left 09/2015  . CHEST TUBE INSERTION Left 10/01/2018   Procedure: INSERTION PLEURAL DRAINAGE CATHETER;  Surgeon: [name], MD;  Location: [facility] ORS;  Service: Thoracic;  Laterality: Left;  . CORONARY ARTERY BYPASS GRAFT  2004   (CABG with LIMA to the  LAD, SVG to OM2/OM3, SVG  to diag  . DIALYSIS/PERMA CATHETER INSERTION N/A 03/14/2019   Procedure: DIALYSIS/PERMA CATHETER INSERTION;  Surgeon: Algernon Huxley, MD;  Location: [place] [facility];  Service: Cardiovascular;  Laterality: N/A;  . DIALYSIS/PERMA CATHETER REMOVAL N/A 05/28/2019   Procedure: DIALYSIS/PERMA CATHETER REMOVAL;  Surgeon: Katha Cabal, MD;  Location: [facility];  Service: Cardiovascular;  Laterality: N/A;  . Left ankle surgery     repair of fracture  . Right lower leg surgery     rod  . TEMPORARY DIALYSIS CATHETER N/A 03/11/2019   Procedure: TEMPORARY DIALYSIS CATHETER;  Surgeon: Algernon Huxley, MD;  Location: [facility];  Service: Cardiovascular;  Laterality: N/A;   SOCIAL HISTORY: Social History   Socioeconomic History  . Marital status: Married    Spouse name: Not on file  . Number of children: Not on file  . Years of education: Not on file  . Highest education level: Not on file  Occupational History  . Not on file  Tobacco Use  . Smoking status: Former Smoker    Packs/day: 0.20    Years: 45.00    Pack years:  9.00    Types: Cigarettes    Quit date: 03/05/2019    Years since quitting: 0.4  . Smokeless tobacco: Former Systems developer    Types: Snuff  Vaping Use  . Vaping Use: Never used  Substance and Sexual Activity  . Alcohol use: Not  Currently    Alcohol/week: 6.0 standard drinks    Types: 6 Cans of beer per week    Comment: occ, average 6 pack in a week  . Drug use: No  . Sexual activity: Not Currently  Other Topics Concern  . Not on file  Social History Narrative   On disability 2009 after prev injuries and CAD.     Married 1976   2 kids, 4 grandkids.    Social Determinants of Health   Financial Resource Strain:   . Difficulty of Paying Living Expenses:   Food Insecurity:   . Worried About Charity fundraiser in the Last Year:   . Arboriculturist in the Last Year:   Transportation Needs:   . Film/video editor (Medical):   [name] Lack of Transportation (Non-Medical):   Physical Activity:   . Days of Exercise per Week:   . Minutes of Exercise per Session:   Stress:   . Feeling of Stress :   Social Connections:   . Frequency of Communication with Friends and Family:   . Frequency of Social Gatherings with Friends and Family:   . Attends Religious Services:   . Active Member of Clubs or Organizations:   . Attends Archivist Meetings:   [name] Marital Status:   Intimate Partner Violence:   . Fear of Current or Ex-Partner:   . Emotionally Abused:   [name] Physically Abused:   . Sexually Abused:      FAMILY HISTORY: Family History  Problem Relation Age of Onset  . Dementia Mother   . Heart disease Father   . Colon cancer Neg Hx   . Prostate cancer Neg Hx   . Diabetes Neg Hx    ALLERGIES:  Allergies  Allergen Reactions  . Lipitor [Atorvastatin Calcium] Other (See Comments)    Aches.  Tolerated crestor.      MEDICATIONS: PHYSICAL EXAMINATION: Current Outpatient Medications on File Prior to Visit  Medication Sig Dispense Refill  . albuterol (VENTOLIN HFA) 108 (90 Base) MCG/ACT inhaler Inhale 2 puffs into the lungs every 6 (six) hours as needed for wheezing or shortness of breath. 18 g 0  . amiodarone (PACERONE) 200 MG tablet TAKE 1 TABLET BY MOUTH EVERY DAY 90 tablet 0  . apixaban  (ELIQUIS) 5 MG TABS tablet Take 1 tablet (5 mg total) by mouth 2 (two) times daily. 60 tablet 5  . clopidogrel (PLAVIX) 75 MG tablet TAKE 1 TABLET BY MOUTH EVERY DAY 90 tablet 1  . dextromethorphan-guaiFENesin (MUCINEX DM) 30-600 MG 12hr tablet Take 1 tablet by mouth 2 (two) times daily.    . DULoxetine (CYMBALTA) 30 MG capsule Take 1 capsule (30 mg total) by mouth daily. 30 capsule 0  . ezetimibe (ZETIA) 10 MG tablet Take 1 tablet (10 mg total) by mouth daily. 30 tablet 5  . ferrous sulfate 325 (65 FE) MG tablet TAKE 1 TABLET (325 MG TOTAL) BY MOUTH 2 (TWO) TIMES DAILY WITH A MEAL. 294 tablet 0  . folic acid (FOLVITE) 1 MG tablet Take 1 tablet (1 mg total) by mouth daily. (Patient taking differently: Take 400 mcg by mouth daily. )    . furosemide (LASIX) 40  MG tablet Take 40 mg (1 tablet) by mouth daily for 5 days then take 40 mg (1 tablet) by mouth every other day. 90 tablet 2  . HUMALOG 100 UNIT/ML injection INJECT (20 UNITS TOTAL) INTO THE SKIN 3 (THREE) TIMES DAILY WITH MEALS. 20 mL 2  . insulin aspart (NOVOLOG) 100 UNIT/ML injection Inject 0-8 Units into the skin as directed. Take 0 units if CBG 70-150 Take 1 units if CBG 151-200 Take 2 units if CBG 201-250 Take 3 units if CBG 251-300 Take 4 units if CBG 301-350 Take 6 units if CBG 351-400 If CBG > 400, give 8 units and call MD 10 mL   . insulin detemir (LEVEMIR) 100 UNIT/ML injection Inject 0.2 mLs (20 Units total) into the skin at bedtime.    [name] levofloxacin (LEVAQUIN) 250 MG tablet Take 2 tablets (500 mg total) by mouth daily for 1 day, THEN 1 tablet (250 mg total) daily for 4 days. 6 tablet 0  . metoprolol succinate (TOPROL-XL) 25 MG 24 hr tablet Take 0.5 tablets (12.5 mg total) by mouth daily. 45 tablet 1  . Multiple Vitamin (MULTIVITAMIN WITH MINERALS) TABS tablet Take 1 tablet by mouth at bedtime.    . multivitamin (RENA-VIT) TABS tablet Take 1 tablet by mouth at bedtime.  0  . oxyCODONE (OXY IR/ROXICODONE) 5 MG immediate release  tablet Take 1 tablet (5 mg total) by mouth every 8 (eight) hours as needed for severe pain. 60 tablet 0  . promethazine (PHENERGAN) 25 MG tablet Take 1 tablet (25 mg total) by mouth every 6 (six) hours as needed for nausea or vomiting. 60 tablet 0  . rosuvastatin (CRESTOR) 10 MG tablet TAKE 1 TABLET (10 MG TOTAL) BY MOUTH DAILY. 90 tablet 3   No current facility-administered medications on file prior to visit.    ECOG PERFORMANCE STATUS:2  Today's Vitals   07/31/19 0942  BP: 114/70  Pulse: 94  Resp: 18  Temp: (!) 96.7 F (35.9 C)  SpO2: 96%  Weight: 187 lb 9.6 oz (85.1 kg)  PainSc: 0-No pain   Body mass index is 25.44 kg/m.  Physical Exam Constitutional:      General: He is not in acute distress.    Comments: Patient sits in the wheelchair.  HENT:     Head: Normocephalic and atraumatic.  Eyes:     General: No scleral icterus.    Pupils: Pupils are equal, round, and reactive to light.  Cardiovascular:     Rate and Rhythm: Normal rate and regular rhythm.     Heart sounds: Normal heart sounds.  Pulmonary:     Effort: Pulmonary effort is normal. No respiratory distress.     Breath sounds: No wheezing.     Comments: Decreased breath sound bilaterally. Abdominal:     General: Bowel sounds are normal. There is no distension.     Palpations: Abdomen is soft. There is no mass.     Tenderness: There is no abdominal tenderness.  Musculoskeletal:        General: Swelling present. No deformity. Normal range of motion.     Cervical back: Normal range of motion and neck supple.  Skin:    General: Skin is warm and dry.     Findings: No erythema or rash.  Neurological:     Mental Status: He is alert. Mental status is at baseline.     Cranial Nerves: No cranial nerve deficit.     Coordination: Coordination normal.  Psychiatric:  Mood and Affect: Mood normal.     LABORATORY DATA:  I have reviewed the data as listed Lab Results  Component Value Date   WBC 18.9 (H)  11/09/2018   HGB 11.0 (L) 11/09/2018   HCT 33.5 (L) 11/09/2018   MCV 89.8 11/09/2018   PLT 581 (H) 11/09/2018   Recent Labs    10/19/18 0841 11/02/18 1238 11/09/18 0833  NA 137 132* 131*  K 4.0 4.0 3.5  CL 104 95* 94*  CO2 _0 GLUCOSE 252* 425* 361*  BUN _1 CREATININE 0.61 0.84 0.77  CALCIUM 8.5* 8.5* 8.0*  GFRNONAA >60 >60 >60  GFRAA >60 >60 >60  PROT 6.0* 7.0 6.5  ALBUMIN 3.0* 2.6* 2.3*  AST 14* 11* 12*  ALT _2 ALKPHOS 69 75 63  BILITOT 0.5 0.9 0.8   Iron/TIBC/Ferritin/ %Sat No results found for: IRON, TIBC, FERRITIN, IRONPCTSAT    RADIOGRAPHIC STUDIES: I have personally reviewed the radiological images as listed and agreed with the findings in the report. DG Chest 2 View  Result Date: 07/29/2019 CLINICAL DATA:  Cough EXAM: CHEST - 2 VIEW COMPARISON:  06/19/2019 FINDINGS: Prior CABG. Heart is normal size. Continued bilateral pleural effusions versus pleural thickening, left greater than right. Improved patchy opacity in the left mid and lower lung. Increasing density in the right mid lung and right base. Underlying chronic lung disease suspected. IMPRESSION: Stable bilateral pleural effusions or pleural thickening, left greater than right. Improving left mid lung and lower lung airspace disease with worsening right mid and lower lung airspace opacities. Findings concerning for pneumonia superimposed on chronic lung disease. Electronically Signed   By: Rolm Baptise M.D.   On: 07/29/2019 08:50   DG Chest 2 View  Result Date: 06/19/2019 CLINICAL DATA:  History of lung cancer short of breath EXAM: CHEST - 2 VIEW COMPARISON:  05/14/2019, CT 03/14/2019 FINDINGS: Post sternotomy changes. Removal of right-sided central venous catheter. No significant radiographic change in appearance of the chest since 05/14/2019 with small left greater than right pleural effusions and patchy foci of airspace disease and ground-glass opacity in the left greater than right thorax.  Stable cardiomediastinal silhouette. Aortic atherosclerosis. IMPRESSION: No significant change since 05/14/2019. Persistent left greater than right pleural effusions with consolidations and ground-glass densities in the left greater than right lungs. CT chest follow-up may be considered given lack of resolution for the findings on the left. Electronically Signed   By: Donavan Foil M.D.   On: 06/19/2019 21:14   DG Chest 2 View  Result Date: 05/14/2019 CLINICAL DATA:  Shortness of breath, lung cancer EXAM: CHEST - 2 VIEW COMPARISON:  05/01/2019 FINDINGS: Right dialysis catheter remains in place, unchanged. Prior CABG. Heart is normal size. Patchy opacities peripherally in the right lung and throughout the left lung. Left lung opacities have increased since prior study. This is concerning for pneumonia superimposed on areas of scarring. Small left pleural effusions suspected. No right effusion. IMPRESSION: Patchy peripheral airspace opacities throughout the right lung are similar to prior study. Increasing airspace disease throughout the left lung concerning for pneumonia. Small left effusion. Electronically Signed   By: Rolm Baptise M.D.   On: 05/14/2019 14:10   MR Brain Wo Contrast  Result Date: 07/12/2019 CLINICAL DATA:  Lung cancer staging EXAM: MRI HEAD WITHOUT CONTRAST TECHNIQUE: Multiplanar, multiecho pulse sequences of the brain and surrounding structures were obtained without intravenous contrast. COMPARISON:  03/05/2019 FINDINGS: Brain: No swelling or masslike finding  disease. The patient has end-stage renal disease. Cerebral volume loss and mild periventricular and pontine chronic small vessel ischemia. No recent infarct, hydrocephalus, collection, or shift. Vascular: Normal flow voids Skull and upper cervical spine: No focal lesion is noted. Degenerative facet spurring. Sinuses/Orbits: Chronic right maxillary sinusitis with mucosal thickening and atelectasis. Left cataract resection. IMPRESSION: No  evidence of metastatic disease. Electronically Signed   By: Marnee Spring M.D.   On: 07/12/2019 09:59   PERIPHERAL VASCULAR CATHETERIZATION  Result Date: 05/28/2019 See op note  NM PET Image Restag (PS) Skull Base To Thigh  Result Date: 07/18/2019 CLINICAL DATA:  Subsequent treatment strategy for lung carcinoma. Immunotherapy ongoing. EXAM: NUCLEAR MEDICINE PET SKULL BASE TO THIGH TECHNIQUE: 10.9 mCi F-18 FDG was injected intravenously. Full-ring PET imaging was performed from the skull base to thigh after the radiotracer. CT data was obtained and used for attenuation correction and anatomic localization. Fasting blood glucose: 113 mg/dl COMPARISON:  PET-CT 23/30/0762 chest CT 03/14/2019. FINDINGS: Mediastinal blood pool activity: SUV max 2.38 Liver activity: SUV max NA NECK: No hypermetabolic lymph nodes in the neck. Incidental CT findings: none CHEST: No hypermetabolic mediastinal or hilar nodes. No suspicious pulmonary nodules on the CT scan. Incidental CT findings: Dense airspace disease in the right lower lobe. More diffuse airspace disease throughout the lungs. Moderate right effusion similar to prior. ABDOMEN/PELVIS: No abnormal hypermetabolic activity within the liver, pancreas, adrenal glands, or spleen. No hypermetabolic lymph nodes in the abdomen or pelvis. Incidental CT findings: none SKELETON: No focal hypermetabolic activity to suggest skeletal metastasis. Incidental CT findings: none IMPRESSION: 1.  No evidence of lung cancer recurrence or metastatic disease. 2. Dense airspace disease in the right lower lobe on the background of more diffuse airspace disease. Recommend clinical correlation for pneumonia or immunotherapy related pneumonitis These results will be called to the ordering clinician or representative by the Radiologist Assistant, and communication documented in the PACS or [employer]. Electronically Signed   By: Genevive Bi M.D.   On: 07/18/2019 14:13   DG Shoulder  Left  Result Date: 07/12/2019 CLINICAL DATA:  Left shoulder pain EXAM: LEFT SHOULDER - 2+ VIEW COMPARISON:  Chest radiograph dated 06/19/2019 FINDINGS: There is no evidence of fracture or dislocation. There is no evidence of arthropathy or other focal bone abnormality. Soft tissues are unremarkable. Median sternotomy wires are noted. IMPRESSION: Negative. Electronically Signed   By: Romona Curls M.D.   On: 07/12/2019 10:00     ASSESSMENT & PLAN:  1. Exertional dyspnea   2. Pneumonia of right lower lobe due to infectious organism   3. Malignant neoplasm of unspecified part of unspecified bronchus or lung ([facility])   4. Stage 4 chronic kidney disease ([facility])    #Stage IV metastatic lung adenocarcinoma TxNx M1 Omniseq showed [id number] mutation, results were discussed with patient and wife. BRAF targeted therapy was discontinued due to patient's recent acute illness including acute CHF, DKA, renal failure on hemodialysis.  Off hemodialysis. Labs are reviewed and discussed with patient. PET scan results were independently reviewed and discussed with patient.  No evidence of lung cancer recurrence or metastatic disease.  He is in complete remission. Hold off immunotherapy today due to ongoing symptoms.  #Chronic dyspnea with exertion. Chest x-ray was independently viewed by me. Agree with cardiology and symptom management clinic. Lasix 40 mg daily.  Finish course of Levaquin. Patient need to follow-up with pulmonology for further evaluation.  #Bilateral lower extremity edema, likely secondary to CHF as well as CKD.  Continue Lasix 40 mg daily.  Anemia, likely secondary to CKD.  Hemoglobin is 12.2.  Monitor. Diabetes, on insulin.   Elevated alkaline phosphatase, possibly due to immunotherapy.  Monitor. Continue follow-up palliative care Follow-up in 3 weeks.   Earlie Server, MD, PhD Hematology Oncology [place] [facility] 07/31/2019

## 2019-08-05 ENCOUNTER — Other Ambulatory Visit
Admission: RE | Admit: 2019-08-05 | Discharge: 2019-08-05 | Disposition: A | Discharge status: Home / Self Care | Payer: Medicare Other | Source: Ambulatory Visit | Attending: Physician Assistant | Admitting: Physician Assistant

## 2019-08-05 DIAGNOSIS — C3490 Malignant neoplasm of unspecified part of unspecified bronchus or lung: Secondary | ICD-10-CM | POA: Insufficient documentation

## 2019-08-05 LAB — T4, FREE: Free T4: 1.28 ng/dL — ABNORMAL HIGH (ref 0.61–1.12)

## 2019-08-05 LAB — TSH: TSH: 4.742 u[IU]/mL — ABNORMAL HIGH (ref 0.350–4.500)

## 2019-08-12 ENCOUNTER — Telehealth: Payer: Self-pay

## 2019-08-12 NOTE — Telephone Encounter (Signed)
[name] (DPR signed) left v/m that pt is having a problem sleeping and is hallucinating. Pt last acute visit was 06/14/19. Pt can go to sleep and then wakes up q 2 H and takes a while for pt to get back to sleep. On 08/10/19 and 08/11/19 was seeing people in room that were not there. Pt's wife wonders if hallucinating because not getting enough sleep.pt has no burning or pain when urinates and is not voiding more often than usual with diuretic. Pt does not have UTI symptoms. Pt taking melatonin 10 mg one pill at night time which was recommendation by oncologist is not helping. Pt is sleeping now. Pt is aware when has hallucinations that no one is there. Pt is not hallucinating today. [name] said pt had done this one other time that for couple of days he had hallucinations and then they just stopped. Dr Damita Dunnings said since pt is not hallucinating today he will think about this to give the best answer he can with what to do or give to help pt with sleeping issue. Dr Damita Dunnings wants to give a good answer and not a quick answer. pts wife voiced understanding and is appreciative. [name] will wait for cb. [name] advised UC & ED precautions and [name] voiced understanding. [name] said that pt never gets violent or like he is going to hurt anyone. FYI to Dr Damita Dunnings. [facility].

## 2019-08-13 MED ORDER — TRAZODONE HCL 50 MG PO TABS: 25.0000 mg | ORAL_TABLET | Freq: Every evening | ORAL | 3 refills | Status: DC | PRN

## 2019-08-13 NOTE — Telephone Encounter (Signed)
If he keeps having trouble with hallucinations then we need to get him reevaluated in the clinic.  If this seems to be related to sleep deprivation, and if melatonin is not helping with sleep, then reasonable to try trazodone 25-50 mg at night for sleep instead of melatonin.  I sent the prescription.  Please update me about his situation in about 2 days, sooner if needed.  Thanks.

## 2019-08-13 NOTE — Telephone Encounter (Signed)
Wife advised and says patient is complaining of some burning and pain with urination.  Appointment scheduled.

## 2019-08-15 ENCOUNTER — Encounter: Payer: Self-pay | Admitting: Family Medicine

## 2019-08-15 ENCOUNTER — Ambulatory Visit (INDEPENDENT_AMBULATORY_CARE_PROVIDER_SITE_OTHER): Payer: Medicare Other | Admitting: Family Medicine

## 2019-08-15 ENCOUNTER — Other Ambulatory Visit: Payer: Self-pay

## 2019-08-15 VITALS — BP 110/62 | HR 63 | Temp 96.9°F | Ht 72.0 in | Wt 197.0 lb

## 2019-08-15 DIAGNOSIS — I5043 Acute on chronic combined systolic (congestive) and diastolic (congestive) heart failure: Secondary | ICD-10-CM | POA: Diagnosis not present

## 2019-08-15 DIAGNOSIS — R443 Hallucinations, unspecified: Secondary | ICD-10-CM | POA: Diagnosis not present

## 2019-08-15 DIAGNOSIS — R0602 Shortness of breath: Secondary | ICD-10-CM

## 2019-08-15 DIAGNOSIS — R300 Dysuria: Secondary | ICD-10-CM

## 2019-08-15 LAB — CBC WITH DIFFERENTIAL/PLATELET
Basophils Absolute: 0.1 10*3/uL (ref 0.0–0.1)
Basophils Relative: 1.3 % (ref 0.0–3.0)
Eosinophils Absolute: 0.2 10*3/uL (ref 0.0–0.7)
Eosinophils Relative: 2.6 % (ref 0.0–5.0)
HCT: 36.2 % — ABNORMAL LOW (ref 39.0–52.0)
Hemoglobin: 11.9 g/dL — ABNORMAL LOW (ref 13.0–17.0)
Lymphocytes Relative: 10.7 % — ABNORMAL LOW (ref 12.0–46.0)
Lymphs Abs: 0.7 10*3/uL (ref 0.7–4.0)
MCHC: 32.9 g/dL (ref 30.0–36.0)
MCV: 90.9 fl (ref 78.0–100.0)
Monocytes Absolute: 0.8 10*3/uL (ref 0.1–1.0)
Monocytes Relative: 12.5 % — ABNORMAL HIGH (ref 3.0–12.0)
Neutro Abs: 4.8 10*3/uL (ref 1.4–7.7)
Neutrophils Relative %: 72.9 % (ref 43.0–77.0)
Platelets: 241 10*3/uL (ref 150.0–400.0)
RBC: 3.98 Mil/uL — ABNORMAL LOW (ref 4.22–5.81)
RDW: 18.5 % — ABNORMAL HIGH (ref 11.5–15.5)
WBC: 6.6 10*3/uL (ref 4.0–10.5)

## 2019-08-15 LAB — COMPREHENSIVE METABOLIC PANEL
ALT: 23 U/L (ref 0–53)
AST: 31 U/L (ref 0–37)
Albumin: 3 g/dL — ABNORMAL LOW (ref 3.5–5.2)
Alkaline Phosphatase: 345 U/L — ABNORMAL HIGH (ref 39–117)
BUN: 42 mg/dL — ABNORMAL HIGH (ref 6–23)
CO2: 24 mEq/L (ref 19–32)
Calcium: 8.5 mg/dL (ref 8.4–10.5)
Chloride: 106 mEq/L (ref 96–112)
Creatinine, Ser: 2.04 mg/dL — ABNORMAL HIGH (ref 0.40–1.50)
GFR: 33 mL/min — ABNORMAL LOW (ref >60.00)
Glucose, Bld: 197 mg/dL — ABNORMAL HIGH (ref 70–99)
Potassium: 3.8 mEq/L (ref 3.5–5.1)
Sodium: 138 mEq/L (ref 135–145)
Total Bilirubin: 0.7 mg/dL (ref 0.2–1.2)
Total Protein: 6.9 g/dL (ref 6.0–8.3)

## 2019-08-15 LAB — BRAIN NATRIURETIC PEPTIDE: Pro B Natriuretic peptide (BNP): 1476 pg/mL — ABNORMAL HIGH (ref 0.0–100.0)

## 2019-08-15 MED ORDER — CEPHALEXIN 500 MG PO CAPS: 500.0000 mg | ORAL_CAPSULE | Freq: Two times a day (BID) | ORAL | 0 refills | Status: DC

## 2019-08-15 NOTE — Progress Notes (Signed)
This visit occurred during the SARS-CoV-2 public health emergency.  Safety protocols were in place, including screening questions prior to the visit, additional usage of staff PPE, and extensive cleaning of exam room while observing appropriate contact time as indicated for disinfecting solutions.  He had more BLE edema QOD lasix, less with daily use.  Has been on QOD recently.  More SOB with exertion.  Less SOB with rest.    Recent hallucinations d/w pt.  Sleeping better with trazodone.  Unclear how much sleep deprivation contributed to the above.  He thought sleep deprivation was contributory.  "It was like I was half way dreaming."  He is aware that the images are not real at the time.  The people seen are family or friends.  He had used 50mg  trazodone.  He is sleeping more during the day and that may disrupt his nights.    Some dysuria, discomfort with urination, burning.  No fevers.  No abd pain.  Some cough.  Some occ "queasy" feeling, resolved with phenergan.   Meds, vitals, and allergies reviewed.   ROS: Per HPI unless specifically indicated in ROS section   nad In wheelchair at baseline. [place] Neck supple, no lymphadenopathy rrr Senile purpura noted on extremities. Dec BS at the [facility].  No inc wob.   Normal orientation.  2021, August, Tuesday- all correct.  Can do math, knows address.   L>R edema BLE.

## 2019-08-15 NOTE — Patient Instructions (Addendum)
Go to the lab on the way out.   If you have mychart we'll likely use that to update you.    Urine sample collected here or at home.  Either way, please get [place] a sample.  Then start the antibiotics.   Take an lasix tomorrow morning and see how that does.   I need to see you kidney function labs to see about the lasix dose after that.   Take care.  Glad to see you.

## 2019-08-16 LAB — URINE CULTURE
MICRO NUMBER:: 10819481
SPECIMEN QUALITY:: ADEQUATE

## 2019-08-18 DIAGNOSIS — I5043 Acute on chronic combined systolic (congestive) and diastolic (congestive) heart failure: Secondary | ICD-10-CM | POA: Diagnosis not present

## 2019-08-18 DIAGNOSIS — J189 Pneumonia, unspecified organism: Secondary | ICD-10-CM | POA: Diagnosis not present

## 2019-08-19 DIAGNOSIS — R443 Hallucinations, unspecified: Secondary | ICD-10-CM | POA: Insufficient documentation

## 2019-08-19 NOTE — Assessment & Plan Note (Signed)
History of.  Could be multifactorial.  Discussed options.  UTI is a consideration.  Check urine culture, as soon as he can collect urine sample then start Keflex.  His mentation is improved now.  Notably when he did have hallucinations prior, he was aware that they were hallucinations and not real events going on in the world.  At this point still okay for outpatient follow-up.  Has family support.

## 2019-08-19 NOTE — Assessment & Plan Note (Signed)
With more lower extremity edema and decreased breath sounds at the bases.  He has been taking Lasix every other day.  We talked about taking Lasix daily for now.  See notes on labs.  At this point still okay for outpatient follow-up.

## 2019-08-20 ENCOUNTER — Inpatient Hospital Stay: Payer: Medicare Other | Attending: Hospice and Palliative Medicine | Admitting: Hospice and Palliative Medicine

## 2019-08-20 ENCOUNTER — Telehealth: Payer: Self-pay | Admitting: Family Medicine

## 2019-08-20 ENCOUNTER — Other Ambulatory Visit: Payer: Self-pay | Admitting: Family Medicine

## 2019-08-20 DIAGNOSIS — C3490 Malignant neoplasm of unspecified part of unspecified bronchus or lung: Secondary | ICD-10-CM | POA: Diagnosis not present

## 2019-08-20 DIAGNOSIS — R748 Abnormal levels of other serum enzymes: Secondary | ICD-10-CM | POA: Insufficient documentation

## 2019-08-20 DIAGNOSIS — R609 Edema, unspecified: Secondary | ICD-10-CM

## 2019-08-20 DIAGNOSIS — R0609 Other forms of dyspnea: Secondary | ICD-10-CM | POA: Insufficient documentation

## 2019-08-20 DIAGNOSIS — Z87891 Personal history of nicotine dependence: Secondary | ICD-10-CM | POA: Insufficient documentation

## 2019-08-20 DIAGNOSIS — Z5112 Encounter for antineoplastic immunotherapy: Secondary | ICD-10-CM | POA: Insufficient documentation

## 2019-08-20 DIAGNOSIS — E1136 Type 2 diabetes mellitus with diabetic cataract: Secondary | ICD-10-CM | POA: Insufficient documentation

## 2019-08-20 DIAGNOSIS — M7989 Other specified soft tissue disorders: Secondary | ICD-10-CM | POA: Insufficient documentation

## 2019-08-20 DIAGNOSIS — E1122 Type 2 diabetes mellitus with diabetic chronic kidney disease: Secondary | ICD-10-CM | POA: Insufficient documentation

## 2019-08-20 DIAGNOSIS — N186 End stage renal disease: Secondary | ICD-10-CM | POA: Insufficient documentation

## 2019-08-20 MED ORDER — FUROSEMIDE 40 MG PO TABS: 80.0000 mg | ORAL_TABLET | Freq: Every day | ORAL | Status: DC

## 2019-08-20 NOTE — Progress Notes (Signed)
Virtual Visit via Telephone Note  I connected with Craig Koch on 08/20/19 at 10:30 AM EDT by telephone and verified that I am speaking with the correct person using two identifiers.   I discussed the limitations, risks, security and privacy concerns of performing an evaluation and management service by telephone and the availability of in person appointments. I also discussed with the patient that there may be a patient responsible charge related to this service. The patient expressed understanding and agreed to proceed.   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 Patient has opted not to pursue chemotherapy and has instead been treated on immunotherapy. Patient has had persistent shortness of breath. CTA of the chest on 11/02/2018 revealed marked increase in tumor encasing the entire left lung with new diffuse increased interstitial thickening concerning for disease progression and lymphangitic spread of disease.Repeat CT of the chest and abdomen on 01/22/2019 revealed significant interval decreaseoftumor burden on the lung. Patient was admitted to the hospital on 03/03/2019 with shortness of breath and weakness. He was found to have non-STEMI and DKA. He was treated with oral antibiotics for community-acquired pneumonia.  Patient was referred to palliative care to help address goals and manage ongoing symptoms.   Observations/Objective: Patient did not respond to attempt for virtual visit.  I spoke with him by telephone.  Patient reports that he is doing well.  He denies any significant changes or concerns today.  No symptomatic complaints.  He continues to have shortness of breath but states that it is overall improved.  He seems to be tolerating treatments well.  He reports stable appetite and  performance status.  Assessment and Plan: Stage IV adenocarcinoma of the lung -on pembrolizumab.  Has follow-up scheduled tomorrow with Dr. Tasia Catchings  Follow Up Instructions: Follow-up MyChart visit in 1 to 2 months   I discussed the assessment and treatment plan with the patient. The patient was provided an opportunity to ask questions and all were answered. The patient agreed with the plan and demonstrated an understanding of the instructions.   The patient was advised to call back or seek an in-person evaluation if the symptoms worsen or if the condition fails to improve as anticipated.  I provided 5 minutes of non-face-to-face time during this encounter.   Irean Hong, NP

## 2019-08-20 NOTE — Telephone Encounter (Signed)
Pt returned your call.  

## 2019-08-21 ENCOUNTER — Encounter: Payer: Self-pay | Admitting: Oncology

## 2019-08-21 ENCOUNTER — Inpatient Hospital Stay: Payer: Medicare Other

## 2019-08-21 ENCOUNTER — Other Ambulatory Visit: Payer: Self-pay

## 2019-08-21 ENCOUNTER — Inpatient Hospital Stay (HOSPITAL_BASED_OUTPATIENT_CLINIC_OR_DEPARTMENT_OTHER): Payer: Medicare Other | Admitting: Oncology

## 2019-08-21 VITALS — BP 93/58 | HR 89 | Temp 96.0°F | Wt 193.0 lb

## 2019-08-21 DIAGNOSIS — E1136 Type 2 diabetes mellitus with diabetic cataract: Secondary | ICD-10-CM | POA: Diagnosis not present

## 2019-08-21 DIAGNOSIS — C3490 Malignant neoplasm of unspecified part of unspecified bronchus or lung: Secondary | ICD-10-CM | POA: Diagnosis not present

## 2019-08-21 DIAGNOSIS — R0609 Other forms of dyspnea: Secondary | ICD-10-CM | POA: Diagnosis not present

## 2019-08-21 DIAGNOSIS — N184 Chronic kidney disease, stage 4 (severe): Secondary | ICD-10-CM | POA: Diagnosis not present

## 2019-08-21 DIAGNOSIS — N186 End stage renal disease: Secondary | ICD-10-CM | POA: Diagnosis not present

## 2019-08-21 DIAGNOSIS — Z5112 Encounter for antineoplastic immunotherapy: Secondary | ICD-10-CM

## 2019-08-21 DIAGNOSIS — R748 Abnormal levels of other serum enzymes: Secondary | ICD-10-CM | POA: Diagnosis not present

## 2019-08-21 DIAGNOSIS — Z87891 Personal history of nicotine dependence: Secondary | ICD-10-CM | POA: Diagnosis not present

## 2019-08-21 DIAGNOSIS — M7989 Other specified soft tissue disorders: Secondary | ICD-10-CM | POA: Diagnosis not present

## 2019-08-21 DIAGNOSIS — E1122 Type 2 diabetes mellitus with diabetic chronic kidney disease: Secondary | ICD-10-CM | POA: Diagnosis not present

## 2019-08-21 LAB — COMPREHENSIVE METABOLIC PANEL
ALT: 29 U/L (ref 0–44)
AST: 38 U/L (ref 15–41)
Albumin: 2.6 g/dL — ABNORMAL LOW (ref 3.5–5.0)
Alkaline Phosphatase: 394 U/L — ABNORMAL HIGH (ref 38–126)
Anion gap: 10 (ref 5–15)
BUN: 36 mg/dL — ABNORMAL HIGH (ref 8–23)
CO2: 22 mmol/L (ref 22–32)
Calcium: 8.4 mg/dL — ABNORMAL LOW (ref 8.9–10.3)
Chloride: 108 mmol/L (ref 98–111)
Creatinine, Ser: 1.77 mg/dL — ABNORMAL HIGH (ref 0.61–1.24)
GFR calc Af Amer: 46 mL/min — ABNORMAL LOW (ref >60)
GFR calc non Af Amer: 40 mL/min — ABNORMAL LOW (ref >60)
Glucose, Bld: 189 mg/dL — ABNORMAL HIGH (ref 70–99)
Potassium: 3.6 mmol/L (ref 3.5–5.1)
Sodium: 140 mmol/L (ref 135–145)
Total Bilirubin: 1 mg/dL (ref 0.3–1.2)
Total Protein: 7 g/dL (ref 6.5–8.1)

## 2019-08-21 LAB — CBC WITH DIFFERENTIAL/PLATELET
Abs Immature Granulocytes: 0.03 10*3/uL (ref 0.00–0.07)
Basophils Absolute: 0.1 10*3/uL (ref 0.0–0.1)
Basophils Relative: 1 %
Eosinophils Absolute: 0.3 10*3/uL (ref 0.0–0.5)
Eosinophils Relative: 5 %
HCT: 36.7 % — ABNORMAL LOW (ref 39.0–52.0)
Hemoglobin: 12.2 g/dL — ABNORMAL LOW (ref 13.0–17.0)
Immature Granulocytes: 0 %
Lymphocytes Relative: 8 %
Lymphs Abs: 0.5 10*3/uL — ABNORMAL LOW (ref 0.7–4.0)
MCH: 29.2 pg (ref 26.0–34.0)
MCHC: 33.2 g/dL (ref 30.0–36.0)
MCV: 87.8 fL (ref 80.0–100.0)
Monocytes Absolute: 1 10*3/uL (ref 0.1–1.0)
Monocytes Relative: 15 %
Neutro Abs: 4.8 10*3/uL (ref 1.7–7.7)
Neutrophils Relative %: 71 %
Platelets: 263 10*3/uL (ref 150–400)
RBC: 4.18 MIL/uL — ABNORMAL LOW (ref 4.22–5.81)
RDW: 18.5 % — ABNORMAL HIGH (ref 11.5–15.5)
WBC: 6.7 10*3/uL (ref 4.0–10.5)
nRBC: 0 % (ref 0.0–0.2)

## 2019-08-21 MED ORDER — SODIUM CHLORIDE 0.9 % IV SOLN
Freq: Once | INTRAVENOUS | Status: AC
  Filled 2019-08-21: qty 250

## 2019-08-21 MED ORDER — SODIUM CHLORIDE 0.9 % IV SOLN
200.0000 mg | Freq: Once | INTRAVENOUS | Status: AC
  Administered 2019-08-21: 200 mg via INTRAVENOUS
  Filled 2019-08-21: qty 8

## 2019-08-21 NOTE — Progress Notes (Signed)
Hematology/Oncologyfollow up  note [facility] Telephone:([phone]) [phone] [phone]   Patient Care Team: Tonia Ghent, MD as PCP - General (Family Medicine) Thelma Comp, [place] (Optometry) Telford Nab, RN as Registered Nurse  REFERRING PROVIDER: Tonia Ghent, MD  CHIEF COMPLAINTS/REASON FOR VISIT:  Follow-up for metastatic lung adenocarcinoma  HISTORY OF PRESENTING ILLNESS:   Craig Koch is a  64 y.o.  male with PMH listed below was seen in consultation at the request of  Tonia Ghent, MD  for evaluation of malignant pleural effusion. Patient was recently admitted to [facility] due to large amount of pleural effusion, acute hypoxic respiratory failure. Patient reports feeling shortness of breath and dyspnea on exertion for several weeks and presented to emergency room.  A chest x-ray 08/23/2018 showed a large amount of left pleural effusion 08/24/2018 CT chest abdomen pelvis confirmed large left pleural effusion with near complete collapse of the left lower lobe and extensive volume loss in the lingula and a portion of the left upper lobe. No acute intra-abdominal process.  Cholelithiasis, diverticulitis, coronary atherosclerosis post CABG.  Bilateral renal cortical scarring.  Aortic atherosclerosis.  #08/24/2018 ultrasound guided diagnostic and therapeutic thoracentesis removed 1.8 L of pleural fluid. Patient subsequently felt better and was discharged to follow-up with pulmonology. 09/04/2018 patient followed up with Dr.Aleskerov chest x-ray showed recurrent moderate left pleural effusion.  Patient underwent ultrasound-guided left thoracentesis again and it drained 3 L of fluid. Pleural fluid cytology positive for adenocarcinoma. Patient was referred to me for further evaluation and management.  # PET eft sided pleural hypermetabolic activity  consistent with malignant pleural effusion. Left upper and lower lobe low-level hypermetabolic him  corresponding to areas of airspace and groundglass opacity.  This was felt to be secondary to atelectasis.  Underlying left upper lobe primary bronchogenic carcinoma cannot be excluded. Medial left upper lobe area more focal hypermetabolic area could represent a pleural disease or a site of small left upper lobe primary. No evidence of extra thoracic hypermetabolic metastatic disease.  Left sided hydro-pneumothorax within the pleural air component being new since the prior CT. Cholelithiasis.  #MRI brain showed punctate focus of enhancement within the right parietal lobe cortex suspicious for possible metastasis. Molecular study showed BRAF V600 mutation. 11/16/2018 started on dabrafenib 150 mg twice daily and trametinib 2 mg daily.  He had good response  #Patient was admitted from 03/03/2019-03/19/2019 due to DKA due to uncontrolled diabetes, acute decompensation of CHF left lower lobe pneumonia right pleural effusion status post thoracentesis.  Fluid study was consistent with transudate. AKI secondary to ATN, on hemodialysis Tuesday Thursday and Saturday, atrial fibrillation with RVR, paroxysmal.  Patient was discharged on Eliquis and amiodarone. Patient was readmitted from 03/26/2019-04/01/2019 due to CHF exacerbation, right pleural effusion recurrent.  Status post diagnostic and therapeutic thoracentesis.  Cytology was negative for malignant cells.   INTERVAL HISTORY Craig Koch is a 64 y.o. male who has above history reviewed by me today presents for follow-up of lung cancer Problems and complaints are listed below:  Patient was accompanied by his wife today. He continues to have shortness of breath and chronic lower extremity swelling. His Lasix regimen has been recently adjusted to 40 mg daily for lower extremity swelling .  Blood pressure is borderline low in the clinic.  He felt some lightheadedness earlier this morning.  No lightheaded currently. He was accompanied by her  wife.  Review of Systems  Constitutional: Positive for fatigue. Negative for appetite change, chills,  diaphoresis and fever.  HENT:   Negative for hearing loss, lump/mass, nosebleeds, sore throat and voice change.   Eyes: Negative for eye problems and icterus.  Respiratory: Positive for shortness of breath. Negative for chest tightness, cough, hemoptysis and wheezing.   Cardiovascular: Positive for leg swelling. Negative for chest pain.  Gastrointestinal: Negative for abdominal distention, abdominal pain, blood in stool, diarrhea, nausea and rectal pain.  Endocrine: Negative for hot flashes.  Genitourinary: Negative for bladder incontinence, difficulty urinating, dysuria, frequency, hematuria and nocturia.   Musculoskeletal: Negative for arthralgias, back pain, flank pain, gait problem and myalgias.  Skin: Negative for itching and rash.  Neurological: Negative for dizziness, gait problem, headaches, light-headedness, numbness and seizures.  Hematological: Negative for adenopathy. Does not bruise/bleed easily.  Psychiatric/Behavioral: Negative for confusion and decreased concentration. The patient is not nervous/anxious.     MEDICAL HISTORY:  Past Medical History:  Diagnosis Date  . Anxiety   . Arthritis   . CHF (congestive heart failure) ([place])   . Coronary artery disease   . Diabetes mellitus   . Dyslipidemia   . Hx of CABG   . Hyperlipidemia   . Hypertension   . Malignant neoplasm of unspecified part of unspecified bronchus or lung ([place]) 08/2018   Immunotherapy  . Pleural effusion     SURGICAL HISTORY: Past Surgical History:  Procedure Laterality Date  . CATARACT EXTRACTION Left 09/2015  . CHEST TUBE INSERTION Left 10/01/2018   Procedure: INSERTION PLEURAL DRAINAGE CATHETER;  Surgeon: [name], MD;  Location: [facility] ORS;  Service: Thoracic;  Laterality: Left;  . CORONARY ARTERY BYPASS GRAFT  2004   (CABG with LIMA to the  LAD, SVG to OM2/OM3, SVG  to diag  .  DIALYSIS/PERMA CATHETER INSERTION N/A 03/14/2019   Procedure: DIALYSIS/PERMA CATHETER INSERTION;  Surgeon: Algernon Huxley, MD;  Location: [place] [facility];  Service: Cardiovascular;  Laterality: N/A;  . DIALYSIS/PERMA CATHETER REMOVAL N/A 05/28/2019   Procedure: DIALYSIS/PERMA CATHETER REMOVAL;  Surgeon: Katha Cabal, MD;  Location: [place] [facility];  Service: Cardiovascular;  Laterality: N/A;  . Left ankle surgery     repair of fracture  . Right lower leg surgery     rod  . TEMPORARY DIALYSIS CATHETER N/A 03/11/2019   Procedure: TEMPORARY DIALYSIS CATHETER;  Surgeon: Algernon Huxley, MD;  Location: [place] [facility];  Service: Cardiovascular;  Laterality: N/A;   SOCIAL HISTORY: Social History   Socioeconomic History  . Marital status: Married    Spouse name: Not on file  . Number of children: Not on file  . Years of education: Not on file  . Highest education level: Not on file  Occupational History  . Not on file  Tobacco Use  . Smoking status: Former Smoker    Packs/day: 0.20    Years: 45.00    Pack years: 9.00    Types: Cigarettes    Quit date: 03/05/2019    Years since quitting: 0.4  . Smokeless tobacco: Former Systems developer    Types: Snuff  Vaping Use  . Vaping Use: Never used  Substance and Sexual Activity  . Alcohol use: Not Currently    Alcohol/week: 6.0 standard drinks    Types: 6 Cans of beer per week    Comment: occ, average 6 pack in a week  . Drug use: No  . Sexual activity: Not Currently  Other Topics Concern  . Not on file  Social History Narrative   On disability 2009 after prev injuries  and CAD.     Married 1976   2 kids, 4 grandkids.    Social Determinants of Health   Financial Resource Strain:   . Difficulty of Paying Living Expenses:   Food Insecurity:   . Worried About Charity fundraiser in the Last Year:   . Arboriculturist in the Last Year:   Transportation Needs:   . Film/video editor (Medical):   [name] Lack of Transportation  (Non-Medical):   Physical Activity:   . Days of Exercise per Week:   . Minutes of Exercise per Session:   Stress:   . Feeling of Stress :   Social Connections:   . Frequency of Communication with Friends and Family:   . Frequency of Social Gatherings with Friends and Family:   . Attends Religious Services:   . Active Member of Clubs or Organizations:   . Attends Archivist Meetings:   [name] Marital Status:   Intimate Partner Violence:   . Fear of Current or Ex-Partner:   . Emotionally Abused:   [name] Physically Abused:   . Sexually Abused:      FAMILY HISTORY: Family History  Problem Relation Age of Onset  . Dementia Mother   . Heart disease Father   . Colon cancer Neg Hx   . Prostate cancer Neg Hx   . Diabetes Neg Hx    ALLERGIES:  Allergies  Allergen Reactions  . Lipitor [Atorvastatin Calcium] Other (See Comments)    Aches.  Tolerated crestor.      MEDICATIONS: PHYSICAL EXAMINATION: Current Outpatient Medications on File Prior to Visit  Medication Sig Dispense Refill  . amiodarone (PACERONE) 200 MG tablet TAKE 1 TABLET BY MOUTH EVERY DAY 90 tablet 0  . apixaban (ELIQUIS) 5 MG TABS tablet Take 1 tablet (5 mg total) by mouth 2 (two) times daily. 60 tablet 5  . cephALEXin (KEFLEX) 500 MG capsule Take 1 capsule (500 mg total) by mouth 2 (two) times daily. 14 capsule 0  . clopidogrel (PLAVIX) 75 MG tablet TAKE 1 TABLET BY MOUTH EVERY DAY 90 tablet 1  . folic acid (FOLVITE) 1 MG tablet Take 1 tablet (1 mg total) by mouth daily. (Patient taking differently: Take 400 mcg by mouth daily. )    . furosemide (LASIX) 40 MG tablet Take 2 tablets (80 mg total) by mouth daily.    . insulin aspart (NOVOLOG) 100 UNIT/ML injection Inject 0-8 Units into the skin as directed. Take 0 units if CBG 70-150 Take 1 units if CBG 151-200 Take 2 units if CBG 201-250 Take 3 units if CBG 251-300 Take 4 units if CBG 301-350 Take 6 units if CBG 351-400 If CBG > 400, give 8 units and call MD 10  mL   . insulin detemir (LEVEMIR) 100 UNIT/ML injection Inject 0.2 mLs (20 Units total) into the skin at bedtime.    . metoprolol succinate (TOPROL-XL) 25 MG 24 hr tablet Take 0.5 tablets (12.5 mg total) by mouth daily. 45 tablet 1  . Multiple Vitamin (MULTIVITAMIN WITH MINERALS) TABS tablet Take 1 tablet by mouth at bedtime.    . multivitamin (RENA-VIT) TABS tablet Take 1 tablet by mouth at bedtime.  0  . promethazine (PHENERGAN) 25 MG tablet Take 1 tablet (25 mg total) by mouth every 6 (six) hours as needed for nausea or vomiting. 60 tablet 0  . rosuvastatin (CRESTOR) 10 MG tablet TAKE 1 TABLET (10 MG TOTAL) BY MOUTH DAILY. 90 tablet 3  . traZODone (  DESYREL) 50 MG tablet Take 0.5-1 tablets (25-50 mg total) by mouth at bedtime as needed for sleep. 30 tablet 3  . albuterol (VENTOLIN HFA) 108 (90 Base) MCG/ACT inhaler Inhale 2 puffs into the lungs every 6 (six) hours as needed for wheezing or shortness of breath. 18 g 0  . dextromethorphan-guaiFENesin (MUCINEX DM) 30-600 MG 12hr tablet Take 1 tablet by mouth 2 (two) times daily. (Patient not taking: Reported on 08/21/2019)    . DULoxetine (CYMBALTA) 30 MG capsule Take 1 capsule (30 mg total) by mouth daily. 30 capsule 0  . ezetimibe (ZETIA) 10 MG tablet Take 1 tablet (10 mg total) by mouth daily. 30 tablet 5  . ferrous sulfate 325 (65 FE) MG tablet TAKE 1 TABLET (325 MG TOTAL) BY MOUTH 2 (TWO) TIMES DAILY WITH A MEAL. 180 tablet 0  . oxyCODONE (OXY IR/ROXICODONE) 5 MG immediate release tablet Take 1 tablet (5 mg total) by mouth every 8 (eight) hours as needed for severe pain. (Patient not taking: Reported on 08/21/2019) 60 tablet 0   No current facility-administered medications on file prior to visit.    ECOG PERFORMANCE STATUS:2  Today's Vitals   08/21/19 0913  BP: (!) 93/58  Pulse: 89  Temp: (!) 96 F (35.6 C)  TempSrc: Tympanic  Weight: 193 lb (87.5 kg)  PainSc: 0-No pain   Body mass index is 26.18 kg/m.  Physical Exam Constitutional:       General: He is not in acute distress.    Comments: Patient sits in the wheelchair.  HENT:     Head: Normocephalic and atraumatic.  Eyes:     General: No scleral icterus.    Pupils: Pupils are equal, round, and reactive to light.  Cardiovascular:     Rate and Rhythm: Normal rate and regular rhythm.     Heart sounds: Normal heart sounds.  Pulmonary:     Effort: Pulmonary effort is normal. No respiratory distress.     Breath sounds: No wheezing.     Comments: Decreased breath sound bilaterally. Abdominal:     General: Bowel sounds are normal. There is no distension.     Palpations: Abdomen is soft. There is no mass.     Tenderness: There is no abdominal tenderness.  Musculoskeletal:        General: Swelling present. No deformity. Normal range of motion.     Cervical back: Normal range of motion and neck supple.  Skin:    General: Skin is warm and dry.     Findings: No erythema or rash.  Neurological:     Mental Status: He is alert. Mental status is at baseline.     Cranial Nerves: No cranial nerve deficit.     Coordination: Coordination normal.  Psychiatric:        Mood and Affect: Mood normal.     LABORATORY DATA:  I have reviewed the data as listed Lab Results  Component Value Date   WBC 18.9 (H) 11/09/2018   HGB 11.0 (L) 11/09/2018   HCT 33.5 (L) 11/09/2018   MCV 89.8 11/09/2018   PLT 581 (H) 11/09/2018   Recent Labs    10/19/18 0841 11/02/18 1238 11/09/18 0833  NA 137 132* 131*  K 4.0 4.0 3.5  CL 104 95* 94*  CO2 _0 GLUCOSE 252* 425* 361*  BUN _1 CREATININE 0.61 0.84 0.77  CALCIUM 8.5* 8.5* 8.0*  GFRNONAA >60 >60 >60  GFRAA >60 >60 >60  PROT 6.0* 7.0  6.5  ALBUMIN 3.0* 2.6* 2.3*  AST 14* 11* 12*  ALT _0 ALKPHOS 69 75 63  BILITOT 0.5 0.9 0.8   Iron/TIBC/Ferritin/ %Sat No results found for: IRON, TIBC, FERRITIN, IRONPCTSAT    RADIOGRAPHIC STUDIES: I have personally reviewed the radiological images as listed and agreed with  the findings in the report. DG Chest 2 View  Result Date: 07/29/2019 CLINICAL DATA:  Cough EXAM: CHEST - 2 VIEW COMPARISON:  06/19/2019 FINDINGS: Prior CABG. Heart is normal size. Continued bilateral pleural effusions versus pleural thickening, left greater than right. Improved patchy opacity in the left mid and lower lung. Increasing density in the right mid lung and right base. Underlying chronic lung disease suspected. IMPRESSION: Stable bilateral pleural effusions or pleural thickening, left greater than right. Improving left mid lung and lower lung airspace disease with worsening right mid and lower lung airspace opacities. Findings concerning for pneumonia superimposed on chronic lung disease. Electronically Signed   By: Rolm Baptise M.D.   On: 07/29/2019 08:50   DG Chest 2 View  Result Date: 06/19/2019 CLINICAL DATA:  History of lung cancer short of breath EXAM: CHEST - 2 VIEW COMPARISON:  05/14/2019, CT 03/14/2019 FINDINGS: Post sternotomy changes. Removal of right-sided central venous catheter. No significant radiographic change in appearance of the chest since 05/14/2019 with small left greater than right pleural effusions and patchy foci of airspace disease and ground-glass opacity in the left greater than right thorax. Stable cardiomediastinal silhouette. Aortic atherosclerosis. IMPRESSION: No significant change since 05/14/2019. Persistent left greater than right pleural effusions with consolidations and ground-glass densities in the left greater than right lungs. CT chest follow-up may be considered given lack of resolution for the findings on the left. Electronically Signed   By: Donavan Foil M.D.   On: 06/19/2019 21:14   MR Brain Wo Contrast  Result Date: 07/12/2019 CLINICAL DATA:  Lung cancer staging EXAM: MRI HEAD WITHOUT CONTRAST TECHNIQUE: Multiplanar, multiecho pulse sequences of the brain and surrounding structures were obtained without intravenous contrast. COMPARISON:  03/05/2019  FINDINGS: Brain: No swelling or masslike finding disease. The patient has end-stage renal disease. Cerebral volume loss and mild periventricular and pontine chronic small vessel ischemia. No recent infarct, hydrocephalus, collection, or shift. Vascular: Normal flow voids Skull and upper cervical spine: No focal lesion is noted. Degenerative facet spurring. Sinuses/Orbits: Chronic right maxillary sinusitis with mucosal thickening and atelectasis. Left cataract resection. IMPRESSION: No evidence of metastatic disease. Electronically Signed   By: Monte Fantasia M.D.   On: 07/12/2019 09:59   PERIPHERAL VASCULAR CATHETERIZATION  Result Date: 05/28/2019 See op note  NM PET Image Restag (PS) Skull Base To Thigh  Result Date: 07/18/2019 CLINICAL DATA:  Subsequent treatment strategy for lung carcinoma. Immunotherapy ongoing. EXAM: NUCLEAR MEDICINE PET SKULL BASE TO THIGH TECHNIQUE: 10.9 mCi F-18 FDG was injected intravenously. Full-ring PET imaging was performed from the skull base to thigh after the radiotracer. CT data was obtained and used for attenuation correction and anatomic localization. Fasting blood glucose: 113 mg/dl COMPARISON:  PET-CT 09/18/2018 chest CT 03/14/2019. FINDINGS: Mediastinal blood pool activity: SUV max 2.38 Liver activity: SUV max NA NECK: No hypermetabolic lymph nodes in the neck. Incidental CT findings: none CHEST: No hypermetabolic mediastinal or hilar nodes. No suspicious pulmonary nodules on the CT scan. Incidental CT findings: Dense airspace disease in the right lower lobe. More diffuse airspace disease throughout the lungs. Moderate right effusion similar to prior. ABDOMEN/PELVIS: No abnormal hypermetabolic activity within the liver, pancreas,  adrenal glands, or spleen. No hypermetabolic lymph nodes in the abdomen or pelvis. Incidental CT findings: none SKELETON: No focal hypermetabolic activity to suggest skeletal metastasis. Incidental CT findings: none IMPRESSION: 1.  No evidence  of lung cancer recurrence or metastatic disease. 2. Dense airspace disease in the right lower lobe on the background of more diffuse airspace disease. Recommend clinical correlation for pneumonia or immunotherapy related pneumonitis These results will be called to the ordering clinician or representative by the Radiologist Assistant, and communication documented in the PACS or [employer]. Electronically Signed   By: Suzy Bouchard M.D.   On: 07/18/2019 14:13   DG Shoulder Left  Result Date: 07/12/2019 CLINICAL DATA:  Left shoulder pain EXAM: LEFT SHOULDER - 2+ VIEW COMPARISON:  Chest radiograph dated 06/19/2019 FINDINGS: There is no evidence of fracture or dislocation. There is no evidence of arthropathy or other focal bone abnormality. Soft tissues are unremarkable. Median sternotomy wires are noted. IMPRESSION: Negative. Electronically Signed   By: Zerita Boers M.D.   On: 07/12/2019 10:00     ASSESSMENT & PLAN:  1. Stage 4 chronic kidney disease ([place])   2. Malignant neoplasm of unspecified part of unspecified bronchus or lung ([place])   3. Encounter for antineoplastic immunotherapy   4. Leg swelling    #Stage IV metastatic lung adenocarcinoma TxNx M1 Omniseq showed [id number] mutation, results were discussed with patient and wife. BRAF targeted therapy was discontinued due to patient's recent acute illness including acute CHF, DKA, renal failure on hemodialysis.  Off hemodialysis. Labs are reviewed and discussed with patient. PET scan results were independently reviewed and discussed with patient.  No evidence of lung cancer recurrence or metastatic disease.  He is in complete remission. Labs are reviewed and discussed with patient.  Continue Keytruda today.  Chronic dyspnea with exertion.  Stable.  This is likely due to CHF/COPD.  Continue follow-up with pulmonology and cardiology. Chronic kidney disease stage IV, creatinine is stable.  Continue follow-up with nephrology.  #Bilateral  lower extremity edema, likely secondary to CHF as well as CKD. Blood pressure is borderline.  Advise patient/wife to communicate with primary care provider, cardiology, kidney doctor to see if Lasix need to be adjusted Elevated alkaline phosphatase, possibly due to immunotherapy.  Continue to monitor Continue follow-up palliative care  Patient has multiple comorbidities and is immunocompromised.  He has not received COVID-19 vaccination. I discussed with him the CDC recommendation and encouraged him to consider vaccination.  Patient is not interested.   Follow-up in 3 weeks.   Earlie Server, MD, PhD Hematology Oncology [place] at [facility] 08/21/2019

## 2019-08-23 ENCOUNTER — Other Ambulatory Visit (INDEPENDENT_AMBULATORY_CARE_PROVIDER_SITE_OTHER): Payer: Medicare Other

## 2019-08-23 ENCOUNTER — Other Ambulatory Visit: Payer: Self-pay

## 2019-08-23 DIAGNOSIS — R609 Edema, unspecified: Secondary | ICD-10-CM | POA: Diagnosis not present

## 2019-08-23 LAB — BASIC METABOLIC PANEL
BUN: 35 mg/dL — ABNORMAL HIGH (ref 6–23)
CO2: 24 mEq/L (ref 19–32)
Calcium: 8.2 mg/dL — ABNORMAL LOW (ref 8.4–10.5)
Chloride: 105 mEq/L (ref 96–112)
Creatinine, Ser: 1.7 mg/dL — ABNORMAL HIGH (ref 0.40–1.50)
GFR: 40.72 mL/min — ABNORMAL LOW (ref >60.00)
Glucose, Bld: 143 mg/dL — ABNORMAL HIGH (ref 70–99)
Potassium: 3 mEq/L — ABNORMAL LOW (ref 3.5–5.1)
Sodium: 138 mEq/L (ref 135–145)

## 2019-08-25 ENCOUNTER — Other Ambulatory Visit: Payer: Self-pay | Admitting: Family Medicine

## 2019-08-25 ENCOUNTER — Telehealth: Payer: Self-pay | Admitting: Family Medicine

## 2019-08-25 MED ORDER — POTASSIUM CHLORIDE ER 10 MEQ PO TBCR: 10.0000 meq | EXTENDED_RELEASE_TABLET | Freq: Every day | ORAL | 1 refills | Status: DC

## 2019-08-25 NOTE — Telephone Encounter (Signed)
Is there a cheaper option than Eliquis for this patient?  Is there any way to get him some help paying for this?  I appreciate your help.

## 2019-08-26 ENCOUNTER — Ambulatory Visit: Payer: Medicare Other | Admitting: Physician Assistant

## 2019-08-26 ENCOUNTER — Telehealth: Payer: Self-pay

## 2019-08-26 ENCOUNTER — Telehealth: Payer: Self-pay | Admitting: Family Medicine

## 2019-08-26 ENCOUNTER — Other Ambulatory Visit: Payer: Self-pay | Admitting: Family Medicine

## 2019-08-26 MED ORDER — FUROSEMIDE 40 MG PO TABS
ORAL_TABLET | ORAL | Status: DC
Start: 2019-08-26 — End: 2019-10-17

## 2019-08-26 NOTE — Telephone Encounter (Signed)
Dr. Damita Dunnings,   I'm reaching out to the patient to discuss eligibility for Patient Assistance. If there are samples available, that would be helpful until we are able to proceed with manufacturer assistance program.   Thank you, Sherre Poot, PharmD, [facility] Clinical Pharmacist [place] [phone] (office) [phone] (mobile)

## 2019-08-26 NOTE — Telephone Encounter (Signed)
Thank you for your help.  I Kein't have any samples here.

## 2019-08-26 NOTE — Progress Notes (Signed)
°  Chronic Care Management   Note  08/26/2019 Name: Craig Koch MRN: [id number] DOB: Apr 07, 1955  Craig Dawn Craine is a 64 y.o. year old male who is a primary care patient of Tonia Ghent, MD. I reached out to Tretha Sciara by phone today in response to a referral sent by [name]'s PCP, Tonia Ghent, MD.   Craig Koch was given information about Chronic Care Management services today including:  1. CCM service includes personalized support from designated clinical staff supervised by his physician, including individualized plan of care and coordination with other care providers 2. 24/7 contact phone numbers for assistance for urgent and routine care needs. 3. Service will only be billed when office clinical staff spend 20 minutes or more in a month to coordinate care. 4. Only one practitioner may furnish and bill the service in a calendar month. 5. The patient may stop CCM services at any time (effective at the end of the month) by phone call to the office staff.   Patient agreed to services and verbal consent obtained.   Follow up plan:   Earney [place] Upstream Scheduler

## 2019-08-27 ENCOUNTER — Telehealth: Payer: Self-pay | Admitting: Cardiovascular Disease

## 2019-08-27 ENCOUNTER — Other Ambulatory Visit: Payer: Self-pay

## 2019-08-27 ENCOUNTER — Ambulatory Visit: Payer: Medicare Other

## 2019-08-27 DIAGNOSIS — E118 Type 2 diabetes mellitus with unspecified complications: Secondary | ICD-10-CM

## 2019-08-27 DIAGNOSIS — I480 Paroxysmal atrial fibrillation: Secondary | ICD-10-CM

## 2019-08-27 NOTE — Chronic Care Management (AMB) (Signed)
Chronic Care Management Pharmacy  Name: Craig Koch  MRN: [id number] DOB: [birth year]  Chief Complaint/ HPI  Craig Koch,  64 y.o. , male presents for their Initial CCM visit with the clinical pharmacist via telephone due to COVID-19 Pandemic.  PCP : Tonia Ghent, MD  Their chronic conditions include: hypertension, CAD, atrial fibrillation, NSTEMI, heart failure, Diabetes, hyperlipidemia, lower urinary tract symptoms,   Office Visits:  08/15/2019 - Trazodone 25-50 mg at night for sleep.   06/14/2019 - Increase furosemde 2 tablets daily until weight is down/swelling reduced.   05/14/2019 -Discussed tapering Levemir since his low sugars tend to happen in the early morning.  Goal AM sugar above 100.  Continue sliding scale insulin at baseline with meals.  He can update me about his sugar in the near future. Go to the lab on the way out for a chest xray. Amoxicillin and Doxycycline given for HCAP.   04/12/2019 - Discussed lowering his Levemir dose at nighttime and still continue with sliding scale insulin.  Goal to avoid low sugars.  Cut back on levemir by 1 unit a day until AM sugar ~120.  If you still have trouble with lows then let me know.  Use the inhaler before bed and see if that helps with the cough. Consult Visit:  08/21/2019 - Oncology - infusion.   08/20/2019 - Palliative Care - no complaints or changes at this time.   07/31/2019 -  Furosemide 40 mg daily. Finish course of levaquin for exertional dyspnea.   07/30/2019 - Increase to lasix 54m qd for 3-5 days then drop back down to every other day furosemide 417m  Recheck BMET in 1 week. Loop in nephrology. Slow changes in position given h/o orthostatic hypotension. Discuss ongoing amiodarone with primary cardiologist (amiodarone lung considered as well). Smoking cessation advised as outlined below.   07/29/2019 - start levaquin for shortness of breath.   07/25/2019 -  Nephrology - . Chronic kidney disease stage  IIIb. Most recent EGFR was found to be 39. Previously had acute kidney injury requiring dialysis but it appears that he is made a full recovery. However suspect that he has been left with some element of chronic kidney disease. Follow-up renal parameters today. Increase protein intake and continue furosemide qod.   07/10/2019 - oncology - infusion.   06/19/2019 - oncology infusion.   06/18/2019 - Palliative care. Coordinated nebulizer machine.  06/17/2019 - Nephrology. Doing well with dialysis. Furosemide 40 mg daily.   05/24/2019 - Cardiology - Lasix daily for now. Keflex after urine sample.   05/20/2019 - Nephrology - Acute kidney injury. Patient was started on dialysis in March 2021. We were able to successfully wean him off of dialysis. Most recent creatinine was 2.28 with an EGFR of 29.   05/20/2019 - Oncology - Is currently off hemodialysis.  Creatinine may reflect his true kidney function without dialysis.  I advised patient to clarify with nephrology if any protein limitations.His nutrition status is low.  Albumin 2.7.  If no protein restriction per nephrology, recommend patient to take nutrition supplements.  Continue follow-up with nutritionist.Anemia, likely secondary to CKD.  Monitor hemoglobin.  Erythropoietin is contraindicated due to active cancer.  Check iron panel at the next visit. Pneumonia,, currently on oral antibiotic treatments. Chest x-ray was independently reviewed and discussed patient. 05/10/2019 - Cardiology - Afib - amiodarone 200 mg daily. Restart Eliquis 5 mg bid. With ongoing or symptomatic hypotension, may need to consider decreasing or holding his BB to  prevent prerenal AKI. Continue BB as BP allows, as well as rosuvastatin and newly started Zetia for risk factor modification.  05/08/2019 - Oncology - immunotherapy.  05/01/2019 - Oncology - Omniseq showed [id number] mutation, results were discussed with patient and wife.BRAF targeted therapy was discontinued due to  patient's recent acute illness including acute CHF, DKA, renal failure on hemodialysis.   04/24/2019 - Nutrition consult for weight loss. Encouraged patient to consume adequate calories/protein.   04/18/2019 - palliative home care visit.   04/17/2019 - oncology - encounter for immunotherapy.   04/08/2019 - oncology - stage IV metastatic lung adenocarcinoma. Patient wants to try immunotherapy.   04/05/2019 - Cardiology - NYHA class III. Unable to add entresto or titrate up metoprolol succinate due to low bp. Palliative care involved.   03/26/2019 - ED to Hospital Admission for SOB.   03/03/2019 - ED to Hospital admission for DKA. Medications: Outpatient Encounter Medications as of 08/27/2019  Medication Sig  . amiodarone (PACERONE) 200 MG tablet TAKE 1 TABLET BY MOUTH EVERY DAY  . apixaban (ELIQUIS) 5 MG TABS tablet Take 1 tablet (5 mg total) by mouth 2 (two) times daily.  [name] bismuth subsalicylate (PEPTO-BISMOL) 262 MG/15ML suspension Take 30 mLs by mouth every 6 (six) hours as needed for diarrhea or loose stools.  . clopidogrel (PLAVIX) 75 MG tablet TAKE 1 TABLET BY MOUTH EVERY DAY  . folic acid (FOLVITE) 1 MG tablet Take 1 tablet (1 mg total) by mouth daily. (Patient taking differently: Take 400 mcg by mouth daily. )  . furosemide (LASIX) 40 MG tablet If lightheaded or SBP <100, then take 38m lasix a day.  If not lightheaded and SBP >100, then take 1221mlasix a day.  . insulin detemir (LEVEMIR) 100 UNIT/ML injection Inject 0.2 mLs (20 Units total) into the skin at bedtime.  . metoprolol succinate (TOPROL-XL) 25 MG 24 hr tablet Take 0.5 tablets (12.5 mg total) by mouth daily.  . Multiple Vitamin (MULTIVITAMIN WITH MINERALS) TABS tablet Take 1 tablet by mouth at bedtime.  . potassium chloride (KLOR-CON) 10 MEQ tablet Take 1 tablet (10 mEq total) by mouth daily.  . Marland Kitchenlbuterol (VENTOLIN HFA) 108 (90 Base) MCG/ACT inhaler Inhale 2 puffs into the lungs every 6 (six) hours as needed for  wheezing or shortness of breath.  . cephALEXin (KEFLEX) 500 MG capsule Take 1 capsule (500 mg total) by mouth 2 (two) times daily. (Patient not taking: Reported on 09/02/2019)  . dextromethorphan-guaiFENesin (MUCINEX DM) 30-600 MG 12hr tablet Take 1 tablet by mouth 2 (two) times daily. (Patient not taking: Reported on 08/21/2019)  . DULoxetine (CYMBALTA) 30 MG capsule Take 1 capsule (30 mg total) by mouth daily.  . Marland Kitchenzetimibe (ZETIA) 10 MG tablet Take 1 tablet (10 mg total) by mouth daily.  . ferrous sulfate 325 (65 FE) MG tablet TAKE 1 TABLET (325 MG TOTAL) BY MOUTH 2 (TWO) TIMES DAILY WITH A MEAL.  . Marland Kitchennsulin aspart (NOVOLOG) 100 UNIT/ML injection Inject 0-8 Units into the skin as directed. Take 0 units if CBG 70-150 Take 1 units if CBG 151-200 Take 2 units if CBG 201-250 Take 3 units if CBG 251-300 Take 4 units if CBG 301-350 Take 6 units if CBG 351-400 If CBG > 400, give 8 units and call MD  . multivitamin (RENA-VIT) TABS tablet Take 1 tablet by mouth at bedtime.  . Marland KitchenxyCODONE (OXY IR/ROXICODONE) 5 MG immediate release tablet Take 1 tablet (5 mg total) by mouth every 8 (eight) hours as needed for severe  pain. (Patient not taking: Reported on 08/21/2019)  . promethazine (PHENERGAN) 25 MG tablet Take 1 tablet (25 mg total) by mouth every 6 (six) hours as needed for nausea or vomiting.  . rosuvastatin (CRESTOR) 10 MG tablet TAKE 1 TABLET (10 MG TOTAL) BY MOUTH DAILY.  . traZODone (DESYREL) 50 MG tablet Take 0.5-1 tablets (25-50 mg total) by mouth at bedtime as needed for sleep.   No facility-administered encounter medications on file as of 08/27/2019.     Current Diagnosis/Assessment:  Goals Addressed            This Visit's Progress   . Pharmacy Care Plan       CARE PLAN ENTRY (see longitudinal plan of care for additional care plan information)  Current Barriers:  . Chronic Disease Management support, education, and care coordination needs related to Diabetes and Atrial Fibrillation     Diabetes Lab Results  Component Value Date/Time   HGBA1C 7.2 (A) 05/14/2019 08:54 AM   HGBA1C 8.8 (H) 03/26/2019 09:55 PM   HGBA1C 11.3 (H) 03/04/2019 10:59 AM   . Pharmacist Clinical Goal(s): o Over the next 90 days, patient will work with PharmD and providers to achieve A1c goal <7% . Current regimen:   Novolog 0-8 units sliding scale before meals   Levemir 100 units/ml 20 units at bedtime . Interventions: o Working to improve access to insulin regimen via patient assistance.  . Patient self care activities - Over the next 90 days, patient will: o Check blood sugar 3-4 times daily, document, and provide at future appointments o Contact provider with any episodes of hypoglycemia o Complete Patient Assistance Application.   Atrial Fibrillation . Pharmacist Clinical Goal(s) o Over the next 90 days, patient will work with PharmD and providers to work on [employer] affordability . Current regimen:   Eliquis 5 mg bid   Amiodarone 200 mg daily . Interventions: o Coordinating patient assistance with cardiology office.  Salli Real at Cardiology checking for samples of Eliquis.  o Patient informed to take proof of income and out of pocket medication expenses.  . Patient self care activities - Over the next 90 days, patient will: o Continue to take medications as prescribed.  o Contact pharmacist or provider with any questions or concerns.   Medication management . Pharmacist Clinical Goal(s): o Over the next 90 days, patient will work with PharmD and providers to maintain optimal medication adherence . Current pharmacy: [facility] . Interventions o Comprehensive medication review performed. o Continue current medication management strategy . Patient self care activities - Over the next 90 days, patient will: o Focus on medication adherence by continuing to use pill box.  o Take medications as prescribed o Report any questions or concerns to PharmD and/or provider(s)  Initial goal  documentation        Diabetes   Recent Relevant Labs: Lab Results  Component Value Date/Time   HGBA1C 7.2 (A) 05/14/2019 08:54 AM   HGBA1C 8.8 (H) 03/26/2019 09:55 PM   HGBA1C 11.3 (H) 03/04/2019 10:59 AM   MICROALBUR 9.9 (H) 02/17/2015 03:25 PM     Checking BG: 3x per Day before meals and at bedtime.   Recent FBG Readings: 180 mg/dL  Recent pre-meal BG readings: 130-170 mg/dL Recent HS BG readings: 200 mg/dL Patient has failed these meds in past: byetta, glipizide, metformin Patient is currently uncontrolled on the following medications:   Novolog 0-8 units sliding scale before meals   Levemir 100 units/ml 20 units at bedtime  Last diabetic  Foot exam:  Lab Results  Component Value Date/Time   HMDIABEYEEXA Retinopathy (A) 09/06/2017 12:00 AM    Last diabetic Eye exam: No results found for: HMDIABFOOTEX   We discussed: diet and exercise extensively. Patient encouraged to get calorie/protein per oncology. He is eating a variety of foods trying to be mindful of protein. Patient's insulins are expensive on top of all other medical expenses. Pharmacist working with Dr. Damita Dunnings to get Novolog and Levemir approved via patient assistance.   Plan  Continue current medications and work to get [employer] or Levermir approved via patient assistance.   AFIB   Patient is currently rhythm controlled. HR 82 BPM  Patient has failed these meds in past: n/a Patient is currently controlled on the following medications:   Eliquis 5 mg bid   Amiodarone 200 mg daily  We discussed:  Patient's Eliquis is cost prohibitive at this time. Pharmacist coordinating with cardiology to work on PAP application and to check for samples. Patient advised to bring proof of income and proof of medication expenses to the cardiology visit 08/06/2019.   Plan  Continue current medications    Medication Management   [facility] Uses pill box? Yes Pt endorses good  compliance - Patient's fill history does not have gaps outside of hospitalizations.   We discussed: Patient's wife picks up his medications and prepares it each day for him. Patient is not driving at this time due to his health.   Plan  Continue current medication management strategy. Immediate needs addressed during today's visit to ensure patient has medication needed. Patient's Eliquis and Insulins are expensive on top of all other medical costs.     Follow up: 1 month phone visit to address disease states other than Diabetes and Afib.

## 2019-08-27 NOTE — Telephone Encounter (Signed)
Patient has upcoming appointment here in our office and needs patient assistance application done for his Eliquis due to cost. Requested that she have them bring 1040 along with pharmacy expense reports then I can submit when they come in. She is also going to email me the application she has done so far. No further questions or needs at this time. I did call for more samples since we are currently out and will watch for that as well.

## 2019-08-27 NOTE — Telephone Encounter (Signed)
Patients PCP calling in regarding eliquis and cost  Please advise

## 2019-08-28 NOTE — Telephone Encounter (Signed)
Pt to come in next week and will complete application at that time.

## 2019-08-28 NOTE — Progress Notes (Signed)
Pharmacist reached out to patient and wife regarding Eliquis cost. Coordinating possibility of samples and application process for patient assistance with cardiology.   Sherre Poot, PharmD, [facility] Clinical Pharmacist [facility] [phone] (office) [phone] (mobile)

## 2019-08-29 ENCOUNTER — Telehealth: Payer: Self-pay | Admitting: *Deleted

## 2019-08-29 ENCOUNTER — Ambulatory Visit: Payer: Medicare Other | Admitting: Family

## 2019-08-29 NOTE — Telephone Encounter (Signed)
Patient advised.   Now patient states he should have mentioned this before but he has been having diarrhea.  His wife got Pepto Bismol from the pharmacy but they advised that he should ask his MD about it because it might interact with his heart medicine.  Please advise.

## 2019-08-29 NOTE — Telephone Encounter (Signed)
Patient states he does not see any real improvement in his leg swelling with the medication increase.  Patient says the swelling goes down some at night but by lunchtime, they are as swollen as ever.

## 2019-08-29 NOTE — Telephone Encounter (Signed)
If the swelling is better by the AM, then I would try OTC compression stockings.  We likely can't get the edema to go away fully.  The initial goal is to get it to a manageable level.  I would continue with the variable lasix dose based on his BP and how he feels.    If lightheaded or SBP <100, then would only take 80mg  lasix a day.  If not lightheaded and SBP >100, then I would take 120mg  lasix a day.   Thanks.

## 2019-08-29 NOTE — Chronic Care Management (AMB) (Deleted)
Chronic Care Management Pharmacy  Name: Craig Koch  MRN: [id number] DOB: Dec 03, 1955  Chief Complaint/ HPI  Craig Koch,  64 y.o. , male presents for their Initial CCM visit with the clinical pharmacist via telephone due to COVID-19 Pandemic.  PCP : Tonia Ghent, MD  Their chronic conditions include: hypertension, CAD, atrial fibrillation, NSTEMI, heart failure, Diabetes, hyperlipidemia, lower urinary tract symptoms,   Office Visits:  08/15/2019 - Trazodone 25-50 mg at night for sleep.   06/14/2019 - Increase furosemde 2 tablets daily until weight is down/swelling reduced.   05/14/2019 -Discussed tapering Levemir since his low sugars tend to happen in the early morning.  Goal AM sugar above 100.  Continue sliding scale insulin at baseline with meals.  He can update me about his sugar in the near future. Go to the lab on the way out for a chest xray. Amoxicillin and Doxycycline given for HCAP.   04/12/2019 - Discussed lowering his Levemir dose at nighttime and still continue with sliding scale insulin.  Goal to avoid low sugars.  Cut back on levemir by 1 unit a day until AM sugar ~120.  If you still have trouble with lows then let me know.  Use the inhaler before bed and see if that helps with the cough. Consult Visit:***  08/21/2019 - Oncology - infusion.   08/20/2019 - Palliative Care - no complaints or changes at this time.   07/31/2019 -  Furosemide 40 mg daily. Finish course of levaquin for exertional dyspnea.   07/30/2019 - Increase to lasix 41m qd for 3-5 days then drop back down to every other day furosemide 47m  Recheck BMET in 1 week. Loop in nephrology. Slow changes in position given h/o orthostatic hypotension. Discuss ongoing amiodarone with primary cardiologist (amiodarone lung considered as well). Smoking cessation advised as outlined below.   07/29/2019 - start levaquin for shortness of breath.   07/25/2019 -  Nephrology - . Chronic kidney disease  stage IIIb. Most recent EGFR was found to be 39. Previously had acute kidney injury requiring dialysis but it appears that he is made a full recovery. However suspect that he has been left with some element of chronic kidney disease. Follow-up renal parameters today. Increase protein intake and continue furosemide qod.   07/10/2019 - oncology - infusion.   06/19/2019 - oncology infusion.   06/18/2019 - Palliative care. Coordinated nebulizer machine.  06/17/2019 - Nephrology. Doing well with dialysis. Furosemide 40 mg daily.   05/24/2019 - Cardiology - Lasix daily for now. Keflex after urine sample.   05/20/2019 - Nephrology - Acute kidney injury. Patient was started on dialysis in March 2021. We were able to successfully wean him off of dialysis. Most recent creatinine was 2.28 with an EGFR of 29.   05/20/2019 - Oncology - Is currently off hemodialysis.  Creatinine may reflect his true kidney function without dialysis.  I advised patient to clarify with nephrology if any protein limitations.His nutrition status is low.  Albumin 2.7.  If no protein restriction per nephrology, recommend patient to take nutrition supplements.  Continue follow-up with nutritionist.Anemia, likely secondary to CKD.  Monitor hemoglobin.  Erythropoietin is contraindicated due to active cancer.  Check iron panel at the next visit. Pneumonia,, currently on oral antibiotic treatments. Chest x-ray was independently reviewed and discussed patient. 05/10/2019 - Cardiology - Afib - amiodarone 200 mg daily. Restart Eliquis 5 mg bid. With ongoing or symptomatic hypotension, may need to consider decreasing or holding his BB to  prevent prerenal AKI. Continue BB as BP allows, as well as rosuvastatin and newly started Zetia for risk factor modification.  05/08/2019 - Oncology - immunotherapy.  05/01/2019 - Oncology - Omniseq showed BRAFV600E mutation, results were discussed with patient and wife.BRAF targeted therapy was discontinued  due to patient's recent acute illness including acute CHF, DKA, renal failure on hemodialysis.   04/24/2019 - Nutrition consult for weight loss. Encouraged patient to consume adequate calories/protein.   04/18/2019 - palliative home care visit.   04/17/2019 - oncology - encounter for immunotherapy.   04/08/2019 - oncology - stage IV metastatic lung adenocarcinoma. Patient wants to try immunotherapy.   04/05/2019 - Cardiology - NYHA class III. Unable to add entresto or titrate up metoprolol succinate due to low bp. Palliative care involved.   03/26/2019 - ED to Hospital Admission for SOB.   03/03/2019 - ED to Hospital admission for DKA. Medications: Outpatient Encounter Medications as of 09/06/2019  Medication Sig  . albuterol (VENTOLIN HFA) 108 (90 Base) MCG/ACT inhaler Inhale 2 puffs into the lungs every 6 (six) hours as needed for wheezing or shortness of breath.  [name] amiodarone (PACERONE) 200 MG tablet TAKE 1 TABLET BY MOUTH EVERY DAY  . apixaban (ELIQUIS) 5 MG TABS tablet Take 1 tablet (5 mg total) by mouth 2 (two) times daily.  . cephALEXin (KEFLEX) 500 MG capsule Take 1 capsule (500 mg total) by mouth 2 (two) times daily.  . clopidogrel (PLAVIX) 75 MG tablet TAKE 1 TABLET BY MOUTH EVERY DAY  . dextromethorphan-guaiFENesin (MUCINEX DM) 30-600 MG 12hr tablet Take 1 tablet by mouth 2 (two) times daily. (Patient not taking: Reported on 08/21/2019)  . DULoxetine (CYMBALTA) 30 MG capsule Take 1 capsule (30 mg total) by mouth daily.  [name] ezetimibe (ZETIA) 10 MG tablet Take 1 tablet (10 mg total) by mouth daily.  . ferrous sulfate 325 (65 FE) MG tablet TAKE 1 TABLET (325 MG TOTAL) BY MOUTH 2 (TWO) TIMES DAILY WITH A MEAL.  . folic acid (FOLVITE) 1 MG tablet Take 1 tablet (1 mg total) by mouth daily. (Patient taking differently: Take 400 mcg by mouth daily. )  . furosemide (LASIX) 40 MG tablet If lightheaded or SBP <100, then take 53m lasix a day.  If not lightheaded and SBP >100, then take 1284m lasix a day.  . insulin aspart (NOVOLOG) 100 UNIT/ML injection Inject 0-8 Units into the skin as directed. Take 0 units if CBG 70-150 Take 1 units if CBG 151-200 Take 2 units if CBG 201-250 Take 3 units if CBG 251-300 Take 4 units if CBG 301-350 Take 6 units if CBG 351-400 If CBG > 400, give 8 units and call MD  . insulin detemir (LEVEMIR) 100 UNIT/ML injection Inject 0.2 mLs (20 Units total) into the skin at bedtime.  . metoprolol succinate (TOPROL-XL) 25 MG 24 hr tablet Take 0.5 tablets (12.5 mg total) by mouth daily.  . Multiple Vitamin (MULTIVITAMIN WITH MINERALS) TABS tablet Take 1 tablet by mouth at bedtime.  . multivitamin (RENA-VIT) TABS tablet Take 1 tablet by mouth at bedtime.  . Marland KitchenxyCODONE (OXY IR/ROXICODONE) 5 MG immediate release tablet Take 1 tablet (5 mg total) by mouth every 8 (eight) hours as needed for severe pain. (Patient not taking: Reported on 08/21/2019)  . potassium chloride (KLOR-CON) 10 MEQ tablet Take 1 tablet (10 mEq total) by mouth daily.  . promethazine (PHENERGAN) 25 MG tablet Take 1 tablet (25 mg total) by mouth every 6 (six) hours as needed for nausea  or vomiting.  . rosuvastatin (CRESTOR) 10 MG tablet TAKE 1 TABLET (10 MG TOTAL) BY MOUTH DAILY.  . traZODone (DESYREL) 50 MG tablet Take 0.5-1 tablets (25-50 mg total) by mouth at bedtime as needed for sleep.   No facility-administered encounter medications on file as of 09/06/2019.     Current Diagnosis/Assessment:  Goals Addressed   None     {[facility] Diagnosis/Assessment:[phone]}

## 2019-08-30 MED ORDER — PEPTO-BISMOL 524 MG/30ML PO SUSP: 30.0000 mL | Freq: Four times a day (QID) | ORAL | 0 refills | Status: DC | PRN

## 2019-08-30 NOTE — Telephone Encounter (Signed)
Should be okay to try Pepto-Bismol.  It will likely turn his stools black.  Use it as needed but I would not take it around the time he takes amiodarone or Eliquis.  Please update [place] as needed.  Thanks.

## 2019-08-30 NOTE — Telephone Encounter (Signed)
Patient advised.

## 2019-08-30 NOTE — Addendum Note (Signed)
Addended by: Tonia Ghent on: 08/30/2019 11:42 AM   Modules accepted: Orders

## 2019-09-02 ENCOUNTER — Telehealth: Payer: Self-pay

## 2019-09-02 NOTE — Patient Instructions (Addendum)
Visit Information  Thank you for your time discussing your medications. I look forward to working with you to achieve your health care goals. Below is a summary of what we talked about during our visit.   Goals Addressed            This Visit's Progress   . Pharmacy Care Plan       CARE PLAN ENTRY (see longitudinal plan of care for additional care plan information)  Current Barriers:  . Chronic Disease Management support, education, and care coordination needs related to Diabetes and Atrial Fibrillation   Diabetes Lab Results  Component Value Date/Time   HGBA1C 7.2 (A) 05/14/2019 08:54 AM   HGBA1C 8.8 (H) 03/26/2019 09:55 PM   HGBA1C 11.3 (H) 03/04/2019 10:59 AM   . Pharmacist Clinical Goal(s): o Over the next 90 days, patient will work with PharmD and providers to achieve A1c goal <7% . Current regimen:   Novolog 0-8 units sliding scale before meals   Levemir 100 units/ml 20 units at bedtime . Interventions: o Working to improve access to insulin regimen via patient assistance.  . Patient self care activities - Over the next 90 days, patient will: o Check blood sugar 3-4 times daily, document, and provide at future appointments o Contact provider with any episodes of hypoglycemia o Complete Patient Assistance Application.   Atrial Fibrillation . Pharmacist Clinical Goal(s) o Over the next 90 days, patient will work with PharmD and providers to work on [employer] affordability . Current regimen:   Eliquis 5 mg bid   Amiodarone 200 mg daily . Interventions: o Coordinating patient assistance with cardiology office.  Salli Real at Cardiology checking for samples of Eliquis.  o Patient informed to take proof of income and out of pocket medication expenses.  . Patient self care activities - Over the next 90 days, patient will: o Continue to take medications as prescribed.  o Contact pharmacist or provider with any questions or concerns.   Medication management . Pharmacist  Clinical Goal(s): o Over the next 90 days, patient will work with PharmD and providers to maintain optimal medication adherence . Current pharmacy: [facility] . Interventions o Comprehensive medication review performed. o Continue current medication management strategy . Patient self care activities - Over the next 90 days, patient will: o Focus on medication adherence by continuing to use pill box.  o Take medications as prescribed o Report any questions or concerns to PharmD and/or provider(s)  Initial goal documentation        Craig Koch was given information about Chronic Care Management services today including:  1. CCM service includes personalized support from designated clinical staff supervised by his physician, including individualized plan of care and coordination with other care providers 2. 24/7 contact phone numbers for assistance for urgent and routine care needs. 3. Standard insurance, coinsurance, copays and deductibles apply for chronic care management only during months in which we provide at least 20 minutes of these services. Most insurances cover these services at 100%, however patients may be responsible for any copay, coinsurance and/or deductible if applicable. This service may help you avoid the need for more expensive face-to-face services. 4. Only one practitioner may furnish and bill the service in a calendar month. 5. The patient may stop CCM services at any time (effective at the end of the month) by phone call to the office staff.  Patient agreed to services and verbal consent obtained.   The patient verbalized understanding of instructions provided today and  declined a print copy of patient instruction materials.  Telephone follow up appointment with pharmacy team member scheduled for: 09/2019  Sherre Poot, PharmD Clinical Pharmacist [facility] [phone] (office) [phone] (mobile)  Protein Content in Foods  Generally, most healthy  people need around 50 grams of protein each day. Depending on your overall health, you may need more or less protein in your diet. Talk to your health care provider or dietitian about how much protein you need. See the following list for the protein content of some common foods. High-protein foods High-protein foods contain 4 grams (4 g) or more of protein per serving. They include:  Beef, ground sirloin (cooked) -- 3 oz have 24 g of protein.  Cheese (hard) -- 1 oz has 7 g of protein.  Chicken breast, boneless and skinless (cooked) -- 3 oz have 13.4 g of protein.  Cottage cheese -- 1/2 cup has 13.4 g of protein.  Egg -- 1 egg has 6 g of protein.  Fish, filet (cooked) -- 1 oz has 6-7 g of protein.  Garbanzo beans (canned or cooked) -- 1/2 cup has 6-7 g of protein.  Kidney beans (canned or cooked) -- 1/2 cup has 6-7 g of protein.  Lamb (cooked) -- 3 oz has 24 g of protein.  Milk -- 1 cup (8 oz) has 8 g of protein.  Nuts (peanuts, pistachios, almonds) -- 1 oz has 6 g of protein.  Peanut butter -- 1 oz has 7-8 g of protein.  Pork tenderloin (cooked) -- 3 oz has 18.4 g of protein.  Pumpkin seeds -- 1 oz has 8.5 g of protein.  Soybeans (roasted) -- 1 oz has 8 g of protein.  Soybeans (cooked) -- 1/2 cup has 11 g of protein.  Soy milk -- 1 cup (8 oz) has 5-10 g of protein.  Soy or vegetable patty -- 1 patty has 11 g of protein.  Sunflower seeds -- 1 oz has 5.5 g of protein.  Tofu (firm) -- 1/2 cup has 20 g of protein.  Tuna (canned in water) -- 3 oz has 20 g of protein.  Yogurt -- 6 oz has 8 g of protein. Low-protein foods Low-protein foods contain 3 grams (3 g) or less of protein per serving. They include:  Beets (raw or cooked) -- 1/2 cup has 1.5 g of protein.  Bran cereal -- 1/2 cup has 2-3 g of protein.  Bread -- 1 slice has 2.5 g of protein.  Broccoli (raw or cooked) -- 1/2 cup has 2 g of protein.  Collard greens (raw or cooked) -- 1/2 cup has 2 g of  protein.  Corn (fresh or cooked) -- 1/2 cup has 2 g of protein.  Cream cheese -- 1 oz has 2 g of protein.  Creamer (half-and-half) -- 1 oz has 1 g of protein.  Flour tortilla -- 1 tortilla has 2.5 g of protein  Frozen yogurt -- 1/2 cup has 3 g of protein.  Fruit or vegetable juice -- 1/2 cup has 1 g of protein.  Green beans (raw or cooked) -- 1/2 cup has 1 g of protein.  Green peas (canned) -- 1/2 cup has 3.5 g of protein.  Muffins -- 1 small muffin (2 oz) has 3 g of protein.  Oatmeal (cooked) -- 1/2 cup has 3 g of protein.  Potato (baked with skin) -- 1 medium potato has 3 g of protein.  Rice (cooked) -- 1/2 cup has 2.5-3.5 g of protein.  Sour  cream -- 1/2 cup has 2.5 g of protein.  Spinach (cooked) -- 1/2 cup has 3 g of protein.  Squash (cooked) -- 1/2 cup has 1.5 g of protein. Actual amounts of protein may be different depending on processing. Talk with your health care provider or dietitian about what foods are recommended for you. This information is not intended to replace advice given to you by your health care provider. Make sure you discuss any questions you have with your health care provider. Document Revised: 08/31/2015 Document Reviewed: 08/31/2015 Elsevier Patient Education  2020 [employer].

## 2019-09-02 NOTE — Progress Notes (Signed)
Chronic Care Management Pharmacy Assistant   Name: KANDEN CAREY  MRN: [id number] DOB: August 11, 1955  Reason for Encounter: Medication Review/ Patient assistance for Novolog and Levemir.   PCP : Tonia Ghent, MD  Allergies:   Allergies  Allergen Reactions  . Lipitor [Atorvastatin Calcium] Other (See Comments)    Aches.  Tolerated crestor.     Medications: Outpatient Encounter Medications as of 09/02/2019  Medication Sig  . albuterol (VENTOLIN HFA) 108 (90 Base) MCG/ACT inhaler Inhale 2 puffs into the lungs every 6 (six) hours as needed for wheezing or shortness of breath.  [name] amiodarone (PACERONE) 200 MG tablet TAKE 1 TABLET BY MOUTH EVERY DAY  . apixaban (ELIQUIS) 5 MG TABS tablet Take 1 tablet (5 mg total) by mouth 2 (two) times daily.  [name] bismuth subsalicylate (PEPTO-BISMOL) 262 MG/15ML suspension Take 30 mLs by mouth every 6 (six) hours as needed for diarrhea or loose stools.  . cephALEXin (KEFLEX) 500 MG capsule Take 1 capsule (500 mg total) by mouth 2 (two) times daily.  . clopidogrel (PLAVIX) 75 MG tablet TAKE 1 TABLET BY MOUTH EVERY DAY  . dextromethorphan-guaiFENesin (MUCINEX DM) 30-600 MG 12hr tablet Take 1 tablet by mouth 2 (two) times daily. (Patient not taking: Reported on 08/21/2019)  . DULoxetine (CYMBALTA) 30 MG capsule Take 1 capsule (30 mg total) by mouth daily.  [name] ezetimibe (ZETIA) 10 MG tablet Take 1 tablet (10 mg total) by mouth daily.  . ferrous sulfate 325 (65 FE) MG tablet TAKE 1 TABLET (325 MG TOTAL) BY MOUTH 2 (TWO) TIMES DAILY WITH A MEAL.  . folic acid (FOLVITE) 1 MG tablet Take 1 tablet (1 mg total) by mouth daily. (Patient taking differently: Take 400 mcg by mouth daily. )  . furosemide (LASIX) 40 MG tablet If lightheaded or SBP <100, then take 80mg  lasix a day.  If not lightheaded and SBP >100, then take 120mg  lasix a day.  . insulin aspart (NOVOLOG) 100 UNIT/ML injection Inject 0-8 Units into the skin as directed. Take 0 units if CBG 70-150 Take  1 units if CBG 151-200 Take 2 units if CBG 201-250 Take 3 units if CBG 251-300 Take 4 units if CBG 301-350 Take 6 units if CBG 351-400 If CBG > 400, give 8 units and call MD  . insulin detemir (LEVEMIR) 100 UNIT/ML injection Inject 0.2 mLs (20 Units total) into the skin at bedtime.  . metoprolol succinate (TOPROL-XL) 25 MG 24 hr tablet Take 0.5 tablets (12.5 mg total) by mouth daily.  . Multiple Vitamin (MULTIVITAMIN WITH MINERALS) TABS tablet Take 1 tablet by mouth at bedtime.  . multivitamin (RENA-VIT) TABS tablet Take 1 tablet by mouth at bedtime.  [name] oxyCODONE (OXY IR/ROXICODONE) 5 MG immediate release tablet Take 1 tablet (5 mg total) by mouth every 8 (eight) hours as needed for severe pain. (Patient not taking: Reported on 08/21/2019)  . potassium chloride (KLOR-CON) 10 MEQ tablet Take 1 tablet (10 mEq total) by mouth daily.  . promethazine (PHENERGAN) 25 MG tablet Take 1 tablet (25 mg total) by mouth every 6 (six) hours as needed for nausea or vomiting.  . rosuvastatin (CRESTOR) 10 MG tablet TAKE 1 TABLET (10 MG TOTAL) BY MOUTH DAILY.  . traZODone (DESYREL) 50 MG tablet Take 0.5-1 tablets (25-50 mg total) by mouth at bedtime as needed for sleep.   No facility-administered encounter medications on file as of 09/02/2019.    Current Diagnosis: Patient Active Problem List   Diagnosis Date Noted  .  Hallucination 08/19/2019  . Encounter for antineoplastic immunotherapy 04/17/2019  . Fatigue 04/17/2019  . Weight loss 04/17/2019  . Acute on chronic combined systolic and diastolic CHF (congestive heart failure) ([place]) 03/26/2019  . HCAP (healthcare-associated pneumonia) 03/26/2019  . Hypoxemia 03/26/2019  . ESRD on hemodialysis ([place]) 03/26/2019  . Hx of CABG 03/26/2019  . AF (paroxysmal atrial fibrillation) ([place]) 03/26/2019  . Acute on chronic combined systolic (congestive) and diastolic (congestive) heart failure ([place]) 03/26/2019  . S/P thoracentesis   . Pleural effusion   . Acute  systolic heart failure ([place])   . Pain of right upper extremity   . Microcytic anemia   . Protein-calorie malnutrition, severe 03/06/2019  . Acute alteration in mental status   . Palliative care encounter   . Atrial fibrillation with RVR ([place]) 03/03/2019  . DKA (diabetic ketoacidoses) ([place]) 03/03/2019  . Hypothermia 03/03/2019  . SOB (shortness of breath) 03/03/2019  . Acute renal failure ([place]) 03/03/2019  . NSTEMI (non-ST elevated myocardial infarction) ([place]) 03/03/2019  . Hyponatremia 02/08/2019  . Uncontrolled type 2 diabetes mellitus with hyperglycemia ([place]) 02/08/2019  . Leg swelling 02/08/2019  . Encounter for antineoplastic chemotherapy 12/08/2018  . Tobacco abuse 10/12/2018  . Malignant pleural effusion   . Administrative encounter 09/23/2018  . Malignant neoplasm of unspecified part of unspecified bronchus or lung ([place]) 09/08/2018  . Goals of care, counseling/discussion 09/08/2018  . Recurrent pleural effusion on left 09/02/2018  . Elevated troponin 08/24/2018  . Medicare annual wellness visit, subsequent 08/07/2018  . Lower urinary tract symptoms (LUTS) 08/07/2018  . Neck pain 08/07/2018  . CAD (coronary artery disease), native coronary artery 09/23/2017  . Smoker 09/23/2017  . Chronic joint pain 10/18/2016  . Healthcare maintenance 07/17/2016  . Advance care planning 07/17/2016  . Fungal rash of torso 05/06/2016  . Trigger finger 05/06/2016  . History of alcohol use 11/17/2015  . Skin tear of forearm without complication 16/10/9602  . Anxiety state 02/19/2015  . Hand weakness 05/23/2011  . Diabetes mellitus with complication ([place]) 54/09/8117  . HLD (hyperlipidemia) 03/05/2010  . Essential hypertension 03/05/2010  . Coronary atherosclerosis 03/05/2010    Goals Addressed   None     Follow-Up:  Patient Assistance Coordination   Spoke to patient to inform him that we are sending him a Patient assistance form  for Novolog and Levemir by mail.Informed patient to  include a copy of his proof of income AND a copy of his Explanation of Benefits (EOB) statement from her insurance.Advised patient to return the PAP forms back to the [place] office.  Patient verbalized understanding  Anderson Malta Clinical Pharmacist Assistant [phone]

## 2019-09-04 ENCOUNTER — Other Ambulatory Visit: Payer: Self-pay | Admitting: Family Medicine

## 2019-09-06 ENCOUNTER — Telehealth: Payer: Self-pay | Admitting: Physician Assistant

## 2019-09-06 ENCOUNTER — Other Ambulatory Visit: Payer: Self-pay

## 2019-09-06 ENCOUNTER — Other Ambulatory Visit
Admission: RE | Admit: 2019-09-06 | Discharge: 2019-09-06 | Disposition: A | Discharge status: Home / Self Care | Payer: Medicare Other | Attending: Physician Assistant | Admitting: Physician Assistant

## 2019-09-06 ENCOUNTER — Encounter: Payer: Self-pay | Admitting: Physician Assistant

## 2019-09-06 ENCOUNTER — Ambulatory Visit (INDEPENDENT_AMBULATORY_CARE_PROVIDER_SITE_OTHER): Payer: Medicare Other | Admitting: Physician Assistant

## 2019-09-06 ENCOUNTER — Telehealth: Payer: Medicare Other

## 2019-09-06 VITALS — BP 100/58 | HR 88 | Ht 72.0 in | Wt 180.6 lb

## 2019-09-06 DIAGNOSIS — I2510 Atherosclerotic heart disease of native coronary artery without angina pectoris: Secondary | ICD-10-CM

## 2019-09-06 DIAGNOSIS — I480 Paroxysmal atrial fibrillation: Secondary | ICD-10-CM | POA: Diagnosis not present

## 2019-09-06 DIAGNOSIS — C3490 Malignant neoplasm of unspecified part of unspecified bronchus or lung: Secondary | ICD-10-CM

## 2019-09-06 DIAGNOSIS — R0602 Shortness of breath: Secondary | ICD-10-CM | POA: Diagnosis not present

## 2019-09-06 DIAGNOSIS — N1832 Chronic kidney disease, stage 3b: Secondary | ICD-10-CM

## 2019-09-06 DIAGNOSIS — Z87448 Personal history of other diseases of urinary system: Secondary | ICD-10-CM

## 2019-09-06 DIAGNOSIS — I959 Hypotension, unspecified: Secondary | ICD-10-CM

## 2019-09-06 DIAGNOSIS — Z9889 Other specified postprocedural states: Secondary | ICD-10-CM

## 2019-09-06 DIAGNOSIS — J449 Chronic obstructive pulmonary disease, unspecified: Secondary | ICD-10-CM

## 2019-09-06 DIAGNOSIS — D638 Anemia in other chronic diseases classified elsewhere: Secondary | ICD-10-CM

## 2019-09-06 DIAGNOSIS — E785 Hyperlipidemia, unspecified: Secondary | ICD-10-CM

## 2019-09-06 DIAGNOSIS — F17200 Nicotine dependence, unspecified, uncomplicated: Secondary | ICD-10-CM

## 2019-09-06 DIAGNOSIS — D509 Iron deficiency anemia, unspecified: Secondary | ICD-10-CM

## 2019-09-06 DIAGNOSIS — I5022 Chronic systolic (congestive) heart failure: Secondary | ICD-10-CM | POA: Diagnosis not present

## 2019-09-06 DIAGNOSIS — I10 Essential (primary) hypertension: Secondary | ICD-10-CM

## 2019-09-06 DIAGNOSIS — R0902 Hypoxemia: Secondary | ICD-10-CM

## 2019-09-06 DIAGNOSIS — I252 Old myocardial infarction: Secondary | ICD-10-CM

## 2019-09-06 DIAGNOSIS — Z794 Long term (current) use of insulin: Secondary | ICD-10-CM

## 2019-09-06 DIAGNOSIS — Z7901 Long term (current) use of anticoagulants: Secondary | ICD-10-CM

## 2019-09-06 DIAGNOSIS — E1121 Type 2 diabetes mellitus with diabetic nephropathy: Secondary | ICD-10-CM

## 2019-09-06 DIAGNOSIS — E8809 Other disorders of plasma-protein metabolism, not elsewhere classified: Secondary | ICD-10-CM

## 2019-09-06 DIAGNOSIS — R0989 Other specified symptoms and signs involving the circulatory and respiratory systems: Secondary | ICD-10-CM

## 2019-09-06 DIAGNOSIS — E876 Hypokalemia: Secondary | ICD-10-CM

## 2019-09-06 DIAGNOSIS — R7989 Other specified abnormal findings of blood chemistry: Secondary | ICD-10-CM

## 2019-09-06 DIAGNOSIS — Z951 Presence of aortocoronary bypass graft: Secondary | ICD-10-CM

## 2019-09-06 DIAGNOSIS — R9431 Abnormal electrocardiogram [ECG] [EKG]: Secondary | ICD-10-CM

## 2019-09-06 DIAGNOSIS — Z79899 Other long term (current) drug therapy: Secondary | ICD-10-CM

## 2019-09-06 LAB — CBC WITH DIFFERENTIAL/PLATELET
Abs Immature Granulocytes: 0.05 10*3/uL (ref 0.00–0.07)
Basophils Absolute: 0.1 10*3/uL (ref 0.0–0.1)
Basophils Relative: 1 %
Eosinophils Absolute: 0.3 10*3/uL (ref 0.0–0.5)
Eosinophils Relative: 3 %
HCT: 37.4 % — ABNORMAL LOW (ref 39.0–52.0)
Hemoglobin: 12.4 g/dL — ABNORMAL LOW (ref 13.0–17.0)
Immature Granulocytes: 1 %
Lymphocytes Relative: 7 %
Lymphs Abs: 0.6 10*3/uL — ABNORMAL LOW (ref 0.7–4.0)
MCH: 29.7 pg (ref 26.0–34.0)
MCHC: 33.2 g/dL (ref 30.0–36.0)
MCV: 89.5 fL (ref 80.0–100.0)
Monocytes Absolute: 1 10*3/uL (ref 0.1–1.0)
Monocytes Relative: 11 %
Neutro Abs: 6.6 10*3/uL (ref 1.7–7.7)
Neutrophils Relative %: 77 %
Platelets: 304 10*3/uL (ref 150–400)
RBC: 4.18 MIL/uL — ABNORMAL LOW (ref 4.22–5.81)
RDW: 19.6 % — ABNORMAL HIGH (ref 11.5–15.5)
WBC: 8.6 10*3/uL (ref 4.0–10.5)
nRBC: 0 % (ref 0.0–0.2)

## 2019-09-06 LAB — COMPREHENSIVE METABOLIC PANEL
ALT: 43 U/L (ref 0–44)
AST: 55 U/L — ABNORMAL HIGH (ref 15–41)
Albumin: 2.4 g/dL — ABNORMAL LOW (ref 3.5–5.0)
Alkaline Phosphatase: 431 U/L — ABNORMAL HIGH (ref 38–126)
Anion gap: 13 (ref 5–15)
BUN: 35 mg/dL — ABNORMAL HIGH (ref 8–23)
CO2: 26 mmol/L (ref 22–32)
Calcium: 8.2 mg/dL — ABNORMAL LOW (ref 8.9–10.3)
Chloride: 100 mmol/L (ref 98–111)
Creatinine, Ser: 1.88 mg/dL — ABNORMAL HIGH (ref 0.61–1.24)
GFR calc Af Amer: 43 mL/min — ABNORMAL LOW (ref >60)
GFR calc non Af Amer: 37 mL/min — ABNORMAL LOW (ref >60)
Glucose, Bld: 336 mg/dL — ABNORMAL HIGH (ref 70–99)
Potassium: 3.2 mmol/L — ABNORMAL LOW (ref 3.5–5.1)
Sodium: 139 mmol/L (ref 135–145)
Total Bilirubin: 1.1 mg/dL (ref 0.3–1.2)
Total Protein: 7 g/dL (ref 6.5–8.1)

## 2019-09-06 LAB — BRAIN NATRIURETIC PEPTIDE: B Natriuretic Peptide: 1625.9 pg/mL — ABNORMAL HIGH (ref 0.0–100.0)

## 2019-09-06 LAB — MAGNESIUM: Magnesium: 1.8 mg/dL (ref 1.7–2.4)

## 2019-09-06 NOTE — Patient Instructions (Addendum)
Medication Instructions:  - Your physician recommends that you continue on your current medications as directed. Please refer to the Current Medication list given to you today.  Samples Given: Eliquis 5 mg Lot: [id number] Exp: 06/2021 # 3 boxes given  *If you need a refill on your cardiac medications before your next appointment, please call your pharmacy*   Lab Work: - Your physician recommends that you have lab work today: CMET/ Magnesium/ CBC/ BNP  If you have labs (blood work) drawn today and your tests are completely normal, you will receive your results only by: [name] MyChart Message (if you have MyChart) OR . A paper copy in the mail If you have any lab test that is abnormal or we need to change your treatment, we will call you to review the results.   Testing/Procedures: - none ordered   Follow-Up: At [facility], you and your health needs are our priority.  As part of our continuing mission to provide you with exceptional heart care, we have created designated Provider Care Teams.  These Care Teams include your primary Cardiologist (physician) and Advanced Practice Providers (APPs -  Physician Assistants and Nurse Practitioners) who all work together to provide you with the care you need, when you need it.  We recommend signing up for the patient portal called "MyChart".  Sign up information is provided on this After Visit Summary.  MyChart is used to connect with patients for Virtual Visits (Telemedicine).  Patients are able to view lab/test results, encounter notes, upcoming appointments, etc.  Non-urgent messages can be sent to your provider as well.   To learn more about what you can do with MyChart, go to [email or web address].    Your next appointment:   2 week(s)  The format for your next appointment:   In Person  Provider:    You may see Ida Rogue, MD or one of the following Advanced Practice Providers on your designated Care Team:    Murray Hodgkins,  NP  Christell Faith, PA-C  Marrianne Mood, PA-C    Other Instructions - It is recommended that you consider being COVID tested. 1) you may go on the [place] webside ([email or web address]/) to schedule this 2) Call [phone] to schedule  3) buy an over the counter testing kit (usually available at [facility])

## 2019-09-06 NOTE — Progress Notes (Signed)
Office Visit    Patient Name: Craig Koch Date of Encounter: 09/06/2019  Primary Care Provider:  Tonia Ghent, MD Primary Cardiologist:  Ida Rogue, MD  Chief Complaint    Chief Complaint  Patient presents with  . Follow-up    3 Month follow up. Patient c/o bilateral swelling in legs and feet, sometimes they are so swollen they hurt. Medications verbally reviewed with patient.     64 yo male with history of CAD s/p four-vessel CABG, HFrEF, A. 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  Past Medical History    Past Medical History:  Diagnosis Date  . Anxiety   . Arthritis   . CHF (congestive heart failure) ([place])   . Coronary artery disease   . Diabetes mellitus   . Dyslipidemia   . Hx of CABG   . Hyperlipidemia   . Hypertension   . Malignant neoplasm of unspecified part of unspecified bronchus or lung ([place]) 08/2018   Immunotherapy  . Pleural effusion    Past Surgical History:  Procedure Laterality Date  . CATARACT EXTRACTION Left 09/2015  . CHEST TUBE INSERTION Left 10/01/2018   Procedure: INSERTION PLEURAL DRAINAGE CATHETER;  Surgeon: [name], MD;  Location: [facility] ORS;  Service: Thoracic;  Laterality: Left;  . CORONARY ARTERY BYPASS GRAFT  2004   (CABG with LIMA to the  LAD, SVG to OM2/OM3, SVG  to diag  . DIALYSIS/PERMA CATHETER INSERTION N/A 03/14/2019   Procedure: DIALYSIS/PERMA CATHETER INSERTION;  Surgeon: Algernon Huxley, MD;  Location: [place] [facility];  Service: Cardiovascular;  Laterality: N/A;  . DIALYSIS/PERMA CATHETER REMOVAL N/A 05/28/2019   Procedure: DIALYSIS/PERMA CATHETER REMOVAL;  Surgeon: Katha Cabal, MD;  Location: [place] [facility];  Service: Cardiovascular;  Laterality: N/A;  . Left ankle surgery     repair of fracture  . Right lower leg surgery     rod  . TEMPORARY DIALYSIS  CATHETER N/A 03/11/2019   Procedure: TEMPORARY DIALYSIS CATHETER;  Surgeon: Algernon Huxley, MD;  Location: [place] [facility];  Service: Cardiovascular;  Laterality: N/A;    Allergies  Allergies  Allergen Reactions  . Lipitor [Atorvastatin Calcium] Other (See Comments)    Aches.  Tolerated crestor.     History of Present Illness    Craig Koch is a 64 y.o. male with PMH as above.  He has history of 2010 stress test with EF 45% and fixed defect involving the inferior wall, most consistent with scar versus hibernating myocardium. Per patient, he also had a stress test in 2012 ruled normal. He is a current smoker.   He was admitted to [facility] 08/2018 with large pleural effusion and underwent thoracentesis. 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 He has reportedly been followed by oncology and responded well to treatment thus far.   He was seen in the office 09/25/2017 and denied chest pain.  No medication changes. Weight loss, dietary changes, and smoking cessation were advised.   He seen at [facility] 03/03/19 after presenting with SOB and found to be in new onset Afib with RVR, AKI requiring HD, NSTEMI, and new HFrEF. It was thought his Afib with RVR was in the setting of underlying metabolic abnormalities.  Rate control limited by BP.  He was started on amiodarone with improvement in ventricular rate.  He  received several doses of digoxin, before it was discontinued due to his renal failure.  HS Tn <27,000 with suspicion for graft occlusion.  Echo showed moderately reduced LVSF.  He was continued on medical therapy / heparin given his renal function with cath deferred.  He had severe hyperglycemia and hyponatremia.  Temporary dialysis catheter was placed and removed by the end of admission with tunneled right IJ hemodialysis catheter placed.    He was discharged 03/19/19 on amiodarone 200 mg daily, apixaban 5 mg twice daily (not actually prescribed at discharge), ASA 81 mg  daily (changed to Plavix at discharge), rosuvastatin, and Toprol. ACE/ARB/ARNI was not added given his soft BP and renal function.   When seen s/p discharge, he was walking regularly. He was following closely with oncology and the HD team. He was not on [facility] with Eliquis, as this prescription was discontinued at discharge for unclear reasons.  [place] restarted. He was down to 1 cigarette per day.   When seen in clinic 5/21, he noted intermittent dizziness, fatigue and dyspnea on exertion. He had weight gain, attributed to caloric intake and with clinic weights 176lbs  181lbs. He noted PND but no orthopnea. No h/o OSA.  He was looking forward to having his IJ HD catheter removed. TSH lab obtained by oncology and noted to be elevated though improved from previous TSH and with amiodarone continued after discussion of TSH with primary cardiologist.   Following nephrology removal of catheter, he was started on lasix, escalated to lasix 48m every other day. 7/22 [facility] nephrology visit noted CKD was stable with recent EGFR 39 and recovery from earlier AKI noted. LEE 2+ on exam with suspected multifactorial etiology, including hypoalbuminemia. Protein intake was encouraged. Lasix qod recommended. Hgb 11.2 with stable Hct and no indication for Procrit at that time. Secondary hyperparathyroidism was noted to be improved and recommendation to continue monitoring.   He was seen 5/26 by the heart failure clinic with furosemide continued.   On 7/26, he was seen by oncology for follow-up of stage IV metastatic lung adenocarcinoma. He reported ongoing dyspnea and shortness of breath. It was noted 07/18/2019 PET showed no evidence of lung cancer recurrence or metastatic disease. Dense airspace disease in RLL was concerning for pneumonia or pneumonitis, which was discussed with Dr. YTasia Catchingsand recommendation was for treating pneumonia. He denied relief with doxycycline and prednisone. He was seen by pulmonology and started on  nebulizer without improvement. He noted intermittent cough. 7/26 CXR was concern for worsening right mid and lower lung airspace opacities. 07/2019 BNP 1318. He was started on Levaquin with plan to reimage later in the week to reevaluate.  He was seen in clinic 07/31/19 with ongoing but improved dyspnea and ongoing PND without orthopnea. He was now able to walk across the kitchen or for at least 5 minutes. He had recently seen nephrology with recommendation to decrease Lasix to every other day dosing, given his CKD 4. It was thought that his lower extremity edema was likely multifactorial as above due to anemia, hypoalbuminemia, CKD.  Since then, he thought he had noted an increase in LEE.  He continued to smoke and had increased to 3 cigarettes/day but planned to start nicotine patches through his PCP. He reported melena, attributed to iron supplementation.  Lasix was increased to 40 mg daily for 3 to 5 days then drop back to 40 mg qod with repeat labs.  He was seen 07/31/2019 and reported feeling about the same.  He denied improvement  or worsening SOB/DOE/LEE/fatigue with increased Lasix.  [id number] mutation was discussed with the patient and his wife. BRAF targeted therapy discontinued.  It was noted he was in complete remission.  Plan was to hold off immunotherapy, due to ongoing symptoms as above.  Recommendation was to finish his course of Levaquin and follow-up with pulmonology for further evaluation.  He was continued on his dose of Lasix qod.  On 08/12/2019, he called his PCP with report of burning and pain with urination.  He denied urination more often than usual on his diuretic.  He also noted hallucinations.  He also reported difficulty sleeping with trazodone 50 mg prescribed at night for sleep instead of melatonin.  He was seen in office at his PCP 08/15/2019 with notes indicating more bilateral LEE on every other day Lasix versus daily use.  He reported more dyspnea but less shortness of breath with  rest.  He was sleeping better with his trazodone.  He was sleeping more during the day.  He noted some queasiness, resolving with Phenergan.  Urine culture was checked, given concern for UTI.  He was then started on Keflex.  He was increased to Lasix 40 mg daily.    8/12 Cr 2.04 with BUN 42 and K3.8 (glucose 345).  Albumin 3.0.  BNP 1476.0.  On 08/20/2019, Lasix increased to 80 mg daily for 5 days.  8/18 creatinine 1.77 with BUN 36 and K3.6 (glucose 394).  Albumin 2.6.  On 8/18, he saw his oncologist and continued to note ongoing symptoms, including shortness of breath and chronic lower extremity edema.  He had dropped down to 40 Lasix daily.  BP was borderline low in the clinic with some lightheadedness reported.  Recommendation was to call his nephrologist to see if Lasix should be adjusted.  Alkaline phosphatase was monitored thought 2/2 immunotherapy.   08/23/2019 labs showed Cr 1.70, BUN 35, K3.0.  He was started on KCl tab 10 M EQ daily.  On 08/25/2019, he called the office regarding cost of Eliquis, as he was in the donut hole.  Samples provided.  On 8/26, he called his PCP with report of ongoing symptoms of lower extremity edema.  He reported the swelling decreased at night and into the morning but increased by lunchtime of the next day.  Compression stockings were recommended.  Sliding scale Lasix was recommended, based on BP.  It was recommended that if SBP <100, take Lasix 80 mg daily.  If not lightheaded and SBP> 100, take 120 mg Lasix daily. He reports that he has been taking Lasix 120 mg daily since that time.   Today, 09/06/2019, he returns to clinic and notes ongoing/unchanged symptoms as described above.  As above, he is still taking Lasix 120 mg daily, which he admits is likely longer than recommended per PCP.  Overall, he does not feel that the increase Lasix has really helped significantly with his swelling or breathing; however, on further questioning, he does note that he feels his LEE  has improved.  He has not yet updated nephrology or had another follow-up visit with [facility], stating that their last report was Cr was stable, and that he did not need to follow-up for some time.  Reviewed the recommendation to monitor his renal function closely, given his CKD 4 and electrolyte disturbances in the past and to prevent need for HD.  He continues to state his lower extremity edema worsens throughout the day, consistent with at least some element of dependent edema.  Discussed likely  multifactorial etiology of his lower extremity edema.  Echoed PCP recommendations for compression stockings, as well as recommended leg elevation.  He reports his legs feel heavy, preventing him from walking; therefore, he is relatively sedentary; therefore, also discussed that some of his fatigue/dyspnea could be 2/2 deconditioning.  No shortness of breath at rest, despite SpO2 88%  86% ORA  90% ORA (taken several times throughout the visit).  He reports checking oxygen saturations at home with SPO2 90-98% at home.  He reports compliance with [place] and denies any s/sx suspicious for DVT.  He denies significant palpitations.  No racing heart rate.  He reports unchanged chronic intermittent dizziness as reported in the past.  He denies any further hallucinations or burning with urination and has finished his Keflex course with reported resolution of UTI.  He reports continued urine output on Lasix and denies any change in urine color or odor.  No s/sx of bleeding with ongoing darker stools 2/2 iron supplementation.  He notes new diarrhea for the past 6 days to 2 weeks, suspected as 2/2 his antibiotics.  He originally was on Imodium; however, he reports that his PCP recommended he instead take Pepto-Bismol from a cardiac standpoint, and this recommendation was echoed today.  With his diarrhea, he reports staying well-hydrated with 8 glasses of water.  He also reports 2-3 sodas per day.  He is drinking diet Pepsi, diet ginger ale,  and diet Sunkist with recommendation to cut back on soda.  He reports adequate appetite with diet of soup, waffles, bacon, and eggs.  Reviewed recommendation for low-salt diet and echoed nephrology recommendation to increase protein, given his hypoalbuminemia.  Also recommended he try to increase fiber.  He has been trying to increase weight with clinic weight down from that of his previous weight, discussed as likely 2/2 diuresis/diarrhea.   He notes his diet is significantly limited by oncology recommendations 2/2 immunotherapy.  He is monitoring his sugars at home with recent labs showing elevated glucose, which could also be contributing to his loose stools.  He keeps a close eye on his vitals at home. BP soft today with patient denying any symptoms with SBP 100s.  He reports home BPs 127/65 and oxygen saturations are normally in the 90s (has a BP cuff and pulse ox at home).  Home Medications    Prior to Admission medications   Medication Sig Start Date End Date Taking? Authorizing Provider  acetaminophen (TYLENOL) 325 MG tablet Take 2 tablets (650 mg total) by mouth every 6 (six) hours as needed for mild pain or fever. 03/19/19   Ezekiel Slocumb, DO  albuterol (VENTOLIN HFA) 108 (90 Base) MCG/ACT inhaler Inhale 2 puffs into the lungs every 6 (six) hours as needed for wheezing or shortness of breath. 04/01/19 05/08/19  Wyvonnia Dusky, MD  amiodarone (PACERONE) 200 MG tablet Take 1 tablet (200 mg total) by mouth daily. 04/23/19 05/23/19  Tonia Ghent, MD  clopidogrel (PLAVIX) 75 MG tablet Take 1 tablet (75 mg total) by mouth daily. 04/23/19 05/23/19  Tonia Ghent, MD  dextromethorphan-guaiFENesin [facility] DM) 30-600 MG 12hr tablet Take 1 tablet by mouth 2 (two) times daily. 03/19/19   Ezekiel Slocumb, DO  dronabinol (MARINOL) 5 MG capsule Take 1 capsule (5 mg total) by mouth 2 (two) times daily before a meal. 04/17/19 05/17/19  Earlie Server, MD  DULoxetine (CYMBALTA) 30 MG capsule Take 1 capsule (30  mg total) by mouth daily. 04/01/19 05/08/19  Wyvonnia Dusky,  MD  Ensure Max Protein (ENSURE MAX PROTEIN) LIQD Take 330 mLs (11 oz total) by mouth 2 (two) times daily between meals. 03/19/19   Nicole Kindred A, DO  ferrous sulfate 325 (65 FE) MG tablet Take 1 tablet (325 mg total) by mouth 2 (two) times daily with a meal. 04/23/19 05/23/19  Tonia Ghent, MD  folic acid (FOLVITE) 1 MG tablet Take 1 tablet (1 mg total) by mouth daily. 03/20/19   Nicole Kindred A, DO  insulin aspart (NOVOLOG) 100 UNIT/ML injection Inject 0-8 Units into the skin as directed. Take 0 units if CBG 70-150 Take 1 units if CBG 151-200 Take 2 units if CBG 201-250 Take 3 units if CBG 251-300 Take 4 units if CBG 301-350 Take 6 units if CBG 351-400 If CBG > 400, give 8 units and call MD 04/15/19   Tonia Ghent, MD  insulin detemir (LEVEMIR) 100 UNIT/ML injection Inject 0.1-0.15 mLs (10-15 Units total) into the skin at bedtime. 04/12/19   Tonia Ghent, MD  levofloxacin (LEVAQUIN) 250 MG tablet Take 2 tablets (556m) by mouth x 1 day and then take 1 tablet (2577m by mouth daily x 6 days 05/02/19   Borders, JoKirt BoysNP  metoprolol succinate (TOPROL-XL) 25 MG 24 hr tablet Take 0.5 tablets (12.5 mg total) by mouth daily. 04/15/19   DuTonia GhentMD  multivitamin (RENA-VIT) TABS tablet Take 1 tablet by mouth at bedtime. 03/19/19   GrEzekiel SlocumbDO  polyethylene glycol (MIRALAX / GLYCOLAX) 17 g packet Take 17 g by mouth daily. 03/20/19   GrEzekiel SlocumbDO  promethazine (PHENERGAN) 25 MG tablet Take 1 tablet (25 mg total) by mouth every 6 (six) hours as needed for nausea or vomiting. 04/08/19   YuEarlie ServerMD  rosuvastatin (CRESTOR) 10 MG tablet TAKE 1 TABLET (10 MG TOTAL) BY MOUTH DAILY. 01/07/19   DuTonia GhentMD  simethicone (MYLICON) 80 MG chewable tablet Chew 1 tablet (80 mg total) by mouth 4 (four) times daily as needed for flatulence. 03/19/19   GrNicole Kindred, DO  thiamine 100 MG tablet Take 1 tablet (100 mg  total) by mouth daily. 03/20/19   GrEzekiel SlocumbDO    Review of Systems    He reports ongoing dyspnea, PND, intermittent dizziness with ambulation, and fatigue.  He reports slightly improved bilateral LEE that continues to worsen throughout the day.  He reports bilateral lower extremity pain, attributed to his swelling, and worse in his bilateral feet.  He reports new diarrhea over the last 1 to 2 weeks.  He denies chest pain, orthopnea, v, syncope, wt gain, or early satiety.  He has increased caloric intake to attempt weight gain.  He reports darker stools 2/2 iron supplementation and not due to bleeding. All other s/sx of bleeding denied.  All other systems reviewed and are otherwise negative except as noted above.  Physical Exam    VS:  BP (!) 100/58 (BP Location: Left Arm, Patient Position: Sitting, Cuff Size: Normal)   Pulse 88   Ht 6' (1.829 m)   Wt 180 lb 9.6 oz (81.9 kg)   BMI 24.49 kg/m  , BMI Body mass index is 24.49 kg/m. GEN: Frail and elderly male present with wife and in wheelchair. HEENT: normal. Neck: Supple, no JVD, L sided carotid bruit, no masses. Cardiac: RRR, 1/6 systolic murmur. No rubs or gallops. No clubbing, cyanosis. 2+ bilateral edema (left LEE reportedly always greater than right LEE).  Radials/DP/PT  1+ and equal bilaterally.  Respiratory:  Respirations regular and unlabored, left base reduced breath sounds > right base reduced breath sounds. GI: Soft, nontender, nondistended, BS + x 4. MS: no deformity or atrophy. Skin: warm and dry, no rash.  Skin changes consistent with lymphedema noted. Neuro:  Strength and sensation are intact. Psych: Normal affect.  Accessory Clinical Findings    ECG personally reviewed by me today -SR with first-degree AV block/PR interval 204 ms, PVCs, previously noted inferior infarct, IVCD with QRS 118 ms, QTC 551 ms,- no acute changes.  VITALS Reviewed today   Temp Readings from Last 3 Encounters:  08/21/19 (!) 96 F (35.6  C) (Tympanic)  08/15/19 (!) 96.9 F (36.1 C) (Temporal)  07/31/19 (!) 96.7 F (35.9 C)   BP Readings from Last 3 Encounters:  09/06/19 (!) 100/58  08/21/19 (!) 93/58  08/15/19 110/62   Pulse Readings from Last 3 Encounters:  09/06/19 88  08/21/19 89  08/15/19 63    Wt Readings from Last 3 Encounters:  09/06/19 180 lb 9.6 oz (81.9 kg)  08/21/19 193 lb (87.5 kg)  08/15/19 197 lb (89.4 kg)    At last clinic visit, wt 181 lb  LABS  reviewed today   [place] present and most recent? Yes/No: No   [place] kidney labs transcribed below: 7/22 parathyroid hormone: 15,  glucose 270 BUN 52, creatinine 1.79 sodium 140, potassium 4.4, CO2 27, chloride 105, calcium 9.2, phosphorus 2.4,  albumin 2.9,  WBC 8.9, RBC 4.16, hemoglobin 12.3, hematocrit 38.9, platelets 290  Lab Results  Component Value Date   WBC 6.7 08/21/2019   HGB 12.2 (L) 08/21/2019   HCT 36.7 (L) 08/21/2019   MCV 87.8 08/21/2019   PLT 263 08/21/2019   Lab Results  Component Value Date   CREATININE 1.70 (H) 08/23/2019   BUN 35 (H) 08/23/2019   NA 138 08/23/2019   K 3.0 (L) 08/23/2019   CL 105 08/23/2019   CO2 24 08/23/2019   Lab Results  Component Value Date   ALT 29 08/21/2019   AST 38 08/21/2019   ALKPHOS 394 (H) 08/21/2019   BILITOT 1.0 08/21/2019   Lab Results  Component Value Date   CHOL 85 (L) 05/24/2019   HDL 41 05/24/2019   LDLCALC 32 05/24/2019   LDLDIRECT 31 05/24/2019   TRIG 47 05/24/2019   CHOLHDL 2.1 05/24/2019    Lab Results  Component Value Date   HGBA1C 7.2 (A) 05/14/2019   Lab Results  Component Value Date   TSH 4.742 (H) 08/05/2019     STUDIES/PROCEDURES reviewed today   2D echo 03/04/2019: 1. Left ventricular ejection fraction, by estimation, is 30 to 35%. The  left ventricle has moderately decreased function. The left ventricle  demonstrates global hypokinesis. Left ventricular diastolic parameters are  indeterminate.  2. Right ventricular  systolic function is normal. The right ventricular  size is normal. Tricuspid regurgitation signal is inadequate for assessing  PA pressure.  3. Left atrial size was mild to moderately dilated.  4. The mitral valve is normal in structure and function. Moderate mitral  valve regurgitation. No evidence of mitral stenosis.  5. The aortic valve is normal in structure and function. AV  regurgitation is trivial. Mild to moderate aortic valve  sclerosis/calcification is present, without any evidence of aortic  stenosis.  6. The inferior vena cava is normal in size with greater than 50%  respiratory variability, suggesting right atrial pressure of 3 mmHg.   Assessment &  Plan   Chronic HFrEF (EF 30 to 35%, 03/04/2019) Chronic DOE / Chronic Hypoxia --Chronic/unchanged DOE/dizziness with ambulation. Multifactorial etiology of sx suspected given h/o adenocarcinoma, COPD with ongoing tobacco use, CAD, HFrEF, PAF, DM2, CKD 4, anemia/hypoalbuminemia, hypotension, abnl TSH.  Also contributing is recent UTI, diarrhea x1-2 weeks, +/-possible pna.  --Decrease to Lasix 40 mg daily.In the setting of CKDIV with minimal sx improvement s/p Lasix 17m and as he has been taking for greater than the recommended 5 days per PCP with recent labs showing low K, low albumin, and Cr as above.    --BMET, Mg labs to ensure K at goal on current dose of KCl tab, Mg at goal 2.0, and Cr stable.  Further recommendations regarding Lasix and KCl tab pending CMET if needed. Obtain BNP to trend after progressive increase in Lasix.  Pt will contact nephrology. Given oxygen saturations today, consider home oxygen +/- repeat CXR as outlined below. Will loop in PCP, nephrology, HF clinic.  Chronic kidney disease, stage IV  History of recent temporary hemodialysis  History of electrolyte imbalance (hypokalemia)  History of hypoalbuminemia  Anemia of chronic disease --Underwent temporary dialysis during previous admission with  improvement in renal function.  PermCath removed. Per [facility], started Lasix 425mqd reduced 7/22 to every other day dosing.  Notes indicated patient was instructed to call if increased LEE; however, he states he has not yet looped [facility] in on his latest sx.  Discussed importance of keeping nephrology in the loop given advanced CKD.  Also discussed that recent UTI, abx, and diarrhea all influence his kidney health.    Recheck BNP, CMET, Mg, CBC, BNP.  Low threshold to send to ED based on repeat labs and given his prolonged QTC, risk of progression to HD, and SpO2 today (not on home oxygen). Decreased to Lasix 40 mg daily, as per initial recommendation by PCP.  Continue current dose KCl tab.  Further recommendations regarding Lasix/KCl tab pending labs if indicated.    Dietary changes recommended.  Discussed that LEE is influenced by albumin 2.6.  Increase protein.  Stay well-hydrated.  Fiber recommended.  Avoid soda, salt, high sugar/carbs.  Continue to recommend compression stockings and leg elevation, given likely component of dependent edema.   History of malignant left-sided pleural effusion s/p thoracentesis, metastatic dz --Followed by oncology.  H/o recurrent and malignant left-sided pleural effusion and malignant neoplasm.  Recurrent thoracentesis with most recent 05/2019 & repeated 06/2019. L hydro-pneumothorax.  Recent PET scan without recurrent CA/metastatic disease.  He is on antineoplastic immunotherapy.   Pt will update his oncologist regarding his diarrhea, given he is immunocompromised.  Consider repeat CXR/CT to rule out recurrent left malignant effusion with previous need for thoracentesis versus antibiotic resistant pneumonia.   Consider home oxygen as above (or per pulmonology, PCP).  Chronic obstructive pulmonary dz  Ongoing tobacco use --Followed by pulmonology.  SpO2 today in the 80s.  He is not on home oxygen.  Will reach out to PCP regarding referral back to pulmonology versus  oncology for possible start of O2 therapy given O2 saturations today.    He has a pulse ox at home and will monitor his oxygen saturations and call the office if SPO2 repeatedly below 90% or worsening dyspnea/SOB.  Considered is recurrent left pleural effusion v pna.  Consider repeat CXR.  Complete tobacco use cessation recommended.  Paroxysmal atrial fibrillation with RVR  Chronic anticoagulation  Amiodarone therapy; Prolonged QTc; Abnormal TSH --Maintaining NSR with 1st degree AVB on  amiodarone 242m daily. QTc increased; however, will continue on amiodarone for now and pending STAT repeat labs, given QTc could be increased 2/2 electrolyte abnormalities. Repeat EKG with close office follow-up in 1-2 weeks. TSH abnormal on previous labs, which has been discussed with primary cardiologist with recommendation to continue amiodarone for now. Continue periodic monitoring labs / CXR / eye exams as per guidelines on amiodarone.  --Rate controlled on current dose BB.   --Continue Eliquis 5529mBID. No s/sx of bleeding with dark stools chronic and in setting of iron supplementation.  Will recheck a CBC today.  Abnormal TSH  --As above. Discussed with primary cardiologist and recommendation is to continue amiodarone for now - primary cardiologist will also be receiving this note to stay in the loop. As previously noted, also discussed in past with MD has been amiodarone lung with low suspicion given known COPD, lung CA, and comorbids as above. Continue to monitor.   Prolonged QTc --Continue amiodarone for now. Recheck electrolytes. Repeat EKG within 1-2 weeks s/p electrolyte repletion. Further recommendations pending labs if indicated. Avoid QT prolonging medications, including antiemetics, antidepressants. Will reach out to PCP or pharmacist regarding trazodone and recommend against ongoing Ph[place]n this setting.   Hypotension, asx --Chronic and improved from BP at oncology. Home SBP 120s.  Previously reported low BP as asx. H/o orthostatic hypotension in past with ongoing recommendation for slow position changes, especially with recent increase in diuresis. Reports unchanged dizziness with ambulation. No LOC, syncope. Escalation of GDMT precluded by BP.   CAD with CABG x4 --No CP. Considered is DOE as in part his anginal equivalent; however, also considered are his comorbids. Not a candidate for cath / PCI as noted in past. Continue Eliquis 29m47mID in place of ASA 64m37montinue Plavix 729mg60m BB as BP allows, rosuvastatin, and Zetia. Escalation of GDMT with ACE/ARB/ARNI precluded by hypotension.   Anemia of chronic dz --Hg and H&H stable and denies any signs of bleeding other than chronic dark stool on iron supplementation. Consider anemia as contributing to LEE and fatigue / DOE / dizziness. No indication for Procrit per [facility]. Continue current iron. Recheck CBC.  Hypoalbuminemia --Consider as contributing to his symptoms of LEE and fatigue. Increase protein as above. Recheck with CMET.  HLD --Continue current statin and Zetia. Most recent 03/04/19 LDL 84 and above goal of <70. Reported statin intolerance. Continue current Crestor 10mg 56mZetia 10mg d57m today with a recheck of  LDL and LFTs at RTC (to ensure at goal, given recent start of Zetia). Will defer for now.   L sided carotid bruit --Per patient preference, [place] of [place] deferred at previous visit. No amaurosis fugax. Dizziness stable / chronic. Reassess at RTC.  Continue current antiplatelet therapy and statin. He knows to get a head CT if he falls and hits his head on OAC.  [place] poorly controlled --Recommend glycemic control for risk factor modification. Reviewed diet in detail as above, especially given recent elevated glucose on labs with diet high in carbohydrates. Increase fiber, protein as above. Follow-up per PCP. Consider uncontrolled glucose as etiology of diarrhea.    Secondary hyperparathyroidism --Per  [facility], no phosphorus binder recommended at this time. Defer to [facility].    Medication changes: Decrease to Lasix 40mg da76mwith further recommendations if indicated pending recheck of CMET, Mg, BNP or if indicated by nephrology. Labs ordered:CMAC, CBC, Mg, BNP.  Studies / Imaging ordered:None.  Future considerations:If BP allows, escalate GDMT.   Continue to reassess  ongoing amiodarone /dose of oral diuretic and potassium repletion.   Magnesium repletion if indicated with consideration of current diarrhea.  Recheck EKG to reassess QTC. Consider home oxygen per PCP/pulmonology/oncology.  Consider repeat CXR if indicated given history of need for thoracentesis and possible pneumonia in the past. Disposition:RTC 1 to 2 weeks.  Further recommendations pending labs.  Arvil Chaco, PA-C 09/06/2019

## 2019-09-08 NOTE — Progress Notes (Signed)
Patient ID: Craig Koch, male    DOB: [birth year], 64 y.o.   MRN: [id number]  HPI  [name] is a 64 y/o male with a history of lung cancer, CAE, DM, hyperlipidemia, HTN, anxiety, pleural effusions, recent tobacco use and chronic heart failure.   Echo report from 03/15/19 reviewed and showed an EF of 35-40% along with moderately elevated PA pressure of 48.4 mmHg and moderate MR.  Admitted 03/26/19 due to HF exacerbation. Had right thoracentesis for right pleural effusion. PT/OT eval done. Initially given IV lasix and then transitioned to oral diuretics. Dialysis done while admitted. Discharged after 6 days.   Craig Koch presents today for a follow-up visit with a chief complaint of moderate shortness of breath with little exertion. Craig Koch describes this as chronic in nature having been present for several years although feels like it's a little bit worse over the last week. Craig Koch has associated fatigue, cough, pedal edema, diarrhea, light-headedness, weakness and slight weight gain along with this. Craig Koch denies any difficulty sleeping, abdominal distention, palpitations or chest pain.    Did take extra potassium as directed by cardiology and is planning on getting labs rechecked today.   Past Medical History:  Diagnosis Date   Anxiety    Arthritis    CHF (congestive heart failure) ([place])    Coronary artery disease    Diabetes mellitus    Dyslipidemia    Hx of CABG    Hyperlipidemia    Hypertension    Malignant neoplasm of unspecified part of unspecified bronchus or lung ([place]) 08/2018   Immunotherapy   Pleural effusion    Past Surgical History:  Procedure Laterality Date   CATARACT EXTRACTION Left 09/2015   CHEST TUBE INSERTION Left 10/01/2018   Procedure: INSERTION PLEURAL DRAINAGE CATHETER;  Surgeon: [name], MD;  Location: [facility];  Service: Thoracic;  Laterality: Left;   CORONARY ARTERY BYPASS GRAFT  2004   (CABG with LIMA to the  LAD, SVG to OM2/OM3, SVG  to diag    DIALYSIS/PERMA CATHETER INSERTION N/A 03/14/2019   Procedure: DIALYSIS/PERMA CATHETER INSERTION;  Surgeon: Algernon Huxley, MD;  Location: [place] [facility];  Service: Cardiovascular;  Laterality: N/A;   DIALYSIS/PERMA CATHETER REMOVAL N/A 05/28/2019   Procedure: DIALYSIS/PERMA CATHETER REMOVAL;  Surgeon: Katha Cabal, MD;  Location: [place] [facility];  Service: Cardiovascular;  Laterality: N/A;   Left ankle surgery     repair of fracture   Right lower leg surgery     rod   TEMPORARY DIALYSIS CATHETER N/A 03/11/2019   Procedure: TEMPORARY DIALYSIS CATHETER;  Surgeon: Algernon Huxley, MD;  Location: [place] [facility];  Service: Cardiovascular;  Laterality: N/A;   Family History  Problem Relation Age of Onset   Dementia Mother    Heart disease Father    Colon cancer Neg Hx    Prostate cancer Neg Hx    Diabetes Neg Hx    Social History   Tobacco Use   Smoking status: Current Every Day Smoker    Packs/day: 0.20    Years: 45.00    Pack years: 9.00    Types: Cigarettes    Last attempt to quit: 03/05/2019    Years since quitting: 0.5   Smokeless tobacco: Former Systems developer    Types: Snuff  Substance Use Topics   Alcohol use: Not Currently    Alcohol/week: 6.0 standard drinks    Types: 6 Cans of beer per week    Comment: occ, average 6 pack  in a week   Allergies  Allergen Reactions   Lipitor [Atorvastatin Calcium] Other (See Comments)    Aches.  Tolerated crestor.    Prior to Admission medications   Medication Sig Start Date End Date Taking? Authorizing Provider  albuterol (VENTOLIN HFA) 108 (90 Base) MCG/ACT inhaler Inhale 2 puffs into the lungs every 6 (six) hours as needed for wheezing or shortness of breath. 04/01/19 09/10/19 Yes Wyvonnia Dusky, MD  amiodarone (PACERONE) 200 MG tablet TAKE 1 TABLET BY MOUTH EVERY DAY 07/16/19  Yes Tonia Ghent, MD  apixaban (ELIQUIS) 5 MG TABS tablet Take 1 tablet (5 mg total) by mouth 2 (two) times daily. 05/10/19  Yes Visser,  Jacquelyn D, PA-C  bismuth subsalicylate (PEPTO-BISMOL) 262 MG/15ML suspension Take 30 mLs by mouth every 6 (six) hours as needed for diarrhea or loose stools. 08/30/19  Yes Tonia Ghent, MD  clopidogrel (PLAVIX) 75 MG tablet TAKE 1 TABLET BY MOUTH EVERY DAY 07/16/19  Yes Tonia Ghent, MD  DULoxetine (CYMBALTA) 30 MG capsule Take 1 capsule (30 mg total) by mouth daily. 04/01/19 09/10/19 Yes Wyvonnia Dusky, MD  ezetimibe (ZETIA) 10 MG tablet Take 1 tablet (10 mg total) by mouth daily. 05/10/19 09/10/19 Yes Visser, Jacquelyn D, PA-C  ferrous sulfate 325 (65 FE) MG tablet TAKE 1 TABLET (325 MG TOTAL) BY MOUTH 2 (TWO) TIMES DAILY WITH A MEAL. 07/16/19 09/10/19 Yes Tonia Ghent, MD  folic acid (FOLVITE) 1 MG tablet Take 1 tablet (1 mg total) by mouth daily. Patient taking differently: Take 400 mcg by mouth daily.  03/20/19  Yes Nicole Kindred A, DO  furosemide (LASIX) 40 MG tablet If lightheaded or SBP <100, then take 80mg  lasix a day.  If not lightheaded and SBP >100, then take 120mg  lasix a day. 08/26/19  Yes Tonia Ghent, MD  insulin aspart (NOVOLOG) 100 UNIT/ML injection Inject 0-8 Units into the skin as directed. Take 0 units if CBG 70-150 Take 1 units if CBG 151-200 Take 2 units if CBG 201-250 Take 3 units if CBG 251-300 Take 4 units if CBG 301-350 Take 6 units if CBG 351-400 If CBG > 400, give 8 units and call MD 04/15/19  Yes Tonia Ghent, MD  insulin detemir (LEVEMIR) 100 UNIT/ML injection Inject 0.2 mLs (20 Units total) into the skin at bedtime. 06/14/19  Yes Tonia Ghent, MD  ipratropium-albuterol (DUONEB) 0.5-2.5 (3) MG/3ML SOLN Take 3 mLs by nebulization 4 (four) times daily. 06/12/19  Yes [provider]  metoprolol succinate (TOPROL-XL) 25 MG 24 hr tablet Take 0.5 tablets (12.5 mg total) by mouth daily. 04/15/19  Yes Tonia Ghent, MD  Multiple Vitamin (MULTIVITAMIN WITH MINERALS) TABS tablet Take 1 tablet by mouth at bedtime.   Yes [provider]   multivitamin (RENA-VIT) TABS tablet Take 1 tablet by mouth at bedtime. 03/19/19  Yes Nicole Kindred A, DO  potassium chloride (KLOR-CON) 10 MEQ tablet Take 1 tablet (10 mEq total) by mouth daily. 08/25/19  Yes Tonia Ghent, MD  rosuvastatin (CRESTOR) 10 MG tablet TAKE 1 TABLET (10 MG TOTAL) BY MOUTH DAILY. 01/07/19  Yes Tonia Ghent, MD    Review of Systems  Constitutional: Positive for fatigue. Negative for appetite change.  HENT: Negative for congestion and postnasal drip.   Eyes: Negative.   Respiratory: Positive for cough and shortness of breath (easily).   Cardiovascular: Positive for leg swelling. Negative for chest pain and palpitations.  Gastrointestinal: Positive for diarrhea (last  couple of weeks). Negative for abdominal distention and abdominal pain.  Endocrine: Negative.   Genitourinary: Negative.   Musculoskeletal: Positive for back pain. Negative for neck pain.  Skin: Negative.   Allergic/Immunologic: Negative.   Neurological: Positive for weakness and light-headedness. Negative for dizziness.  Hematological: Negative for adenopathy. Bruises/bleeds easily.  Psychiatric/Behavioral: Negative for dysphoric mood and sleep disturbance (sleeping on 2 pillow). The patient is not nervous/anxious.    Vitals:   09/10/19 0914  BP: 103/66  Pulse: 84  Resp: (!) 22  SpO2: 97%  Weight: 184 lb 2 oz (83.5 kg)  Height: 6' (1.829 m)   Wt Readings from Last 3 Encounters:  09/10/19 184 lb 2 oz (83.5 kg)  09/06/19 180 lb 9.6 oz (81.9 kg)  08/21/19 193 lb (87.5 kg)   Lab Results  Component Value Date   CREATININE 1.88 (H) 09/06/2019   CREATININE 1.70 (H) 08/23/2019   CREATININE 1.77 (H) 08/21/2019    Physical Exam Vitals and nursing note reviewed.  Constitutional:      Appearance: Normal appearance.  HENT:     Head: Normocephalic and atraumatic.  Cardiovascular:     Rate and Rhythm: Normal rate and regular rhythm.  Pulmonary:     Effort: Pulmonary effort is normal. No  respiratory distress.     Breath sounds: No wheezing or rales.  Abdominal:     General: There is no distension.     Palpations: Abdomen is soft.  Musculoskeletal:        General: No tenderness.     Cervical back: Neck supple.     Right lower leg: Edema (2+ soft pitting) present.     Left lower leg: Edema (2+ soft pitting) present.  Skin:    General: Skin is warm and dry.  Neurological:     General: No focal deficit present.     Mental Status: Craig Koch is alert and oriented to person, place, and time.  Psychiatric:        Mood and Affect: Mood normal.        Behavior: Behavior normal.        Thought Content: Thought content normal.   Assessment & Plan:  1: Chronic heart failure with reduced ejection fraction- - NYHA class III - euvolemic today - weighing daily and Craig Koch was instructed to call for an overnight weight gain of > 2 pounds or a weekly weight gain of >5 pounds - weight up 3 pounds since last visit here 3 months ago - says that Craig Koch's drinking "way more" than 64 ounces of fluid daily because his mouth stays dry; reviewed the importance of keeping his daily intake to ~ 64 ounces / day - not adding salt to his food and the importance of following a 2000mg  sodium diet was reviewed - saw cardiology Mickle Plumb) 09/06/19  - patient and wife say that they unable to get compression socks on; advised to try wrapping them with ACE wraps or go to medical supply store to get measured; also encouraged elevation of legs when sitting for long periods of time - BNP 09/06/19 was 1625.9 - not interested in covid vaccines  2: HTN- - BP looks good although continues on the low side - saw PCP Damita Dunnings) 08/15/19 - BMP 09/06/19 reviewed and showed sodium 139, potassium 3.2, creatinine 1.88 and GFR 37 (was then told by cardiology to take extra 28meq potassium over 2 days); getting labs rechecked today  3: DM with CKD- - A1c 05/14/19 was 7.2% - saw nephrology Holley Raring) 07/25/19 -  nonfasting glucose at home today  was 220  4: Stage IV lung cancer-  - saw oncology Tasia Catchings) 08/21/19 - palliative care visit done 08/20/19 - saw pulmonology Lanney Gins) on 06/12/19    Medication list reviewed.   Due to numerous other provider appointments, patient and wife opt to not make another appointment at this time. Advised to continue weighing daily and to call us in the future if they'd like to make another appointment. Patient and wife were comfortable with this plan.

## 2019-09-09 ENCOUNTER — Other Ambulatory Visit: Payer: Self-pay | Admitting: Physician Assistant

## 2019-09-09 ENCOUNTER — Encounter: Payer: Self-pay | Admitting: Family Medicine

## 2019-09-09 ENCOUNTER — Other Ambulatory Visit: Payer: Self-pay | Admitting: Family Medicine

## 2019-09-09 DIAGNOSIS — I5022 Chronic systolic (congestive) heart failure: Secondary | ICD-10-CM

## 2019-09-10 ENCOUNTER — Ambulatory Visit: Payer: Medicare Other | Admitting: Family

## 2019-09-10 ENCOUNTER — Other Ambulatory Visit: Payer: Self-pay

## 2019-09-10 ENCOUNTER — Other Ambulatory Visit
Admission: RE | Admit: 2019-09-10 | Discharge: 2019-09-10 | Disposition: A | Discharge status: Home / Self Care | Payer: Medicare Other | Attending: Physician Assistant | Admitting: Physician Assistant

## 2019-09-10 ENCOUNTER — Encounter: Payer: Self-pay | Admitting: Family

## 2019-09-10 ENCOUNTER — Other Ambulatory Visit: Admission: RE | Admit: 2019-09-10 | Payer: Medicare Other | Source: Ambulatory Visit

## 2019-09-10 VITALS — BP 103/66 | HR 84 | Resp 22 | Ht 72.0 in | Wt 184.1 lb

## 2019-09-10 DIAGNOSIS — I5022 Chronic systolic (congestive) heart failure: Secondary | ICD-10-CM | POA: Diagnosis not present

## 2019-09-10 DIAGNOSIS — C3490 Malignant neoplasm of unspecified part of unspecified bronchus or lung: Secondary | ICD-10-CM

## 2019-09-10 DIAGNOSIS — N1832 Chronic kidney disease, stage 3b: Secondary | ICD-10-CM

## 2019-09-10 DIAGNOSIS — Z794 Long term (current) use of insulin: Secondary | ICD-10-CM

## 2019-09-10 DIAGNOSIS — I10 Essential (primary) hypertension: Secondary | ICD-10-CM

## 2019-09-10 LAB — COMPREHENSIVE METABOLIC PANEL
ALT: 48 U/L — ABNORMAL HIGH (ref 0–44)
AST: 60 U/L — ABNORMAL HIGH (ref 15–41)
Albumin: 2.3 g/dL — ABNORMAL LOW (ref 3.5–5.0)
Alkaline Phosphatase: 458 U/L — ABNORMAL HIGH (ref 38–126)
Anion gap: 10 (ref 5–15)
BUN: 45 mg/dL — ABNORMAL HIGH (ref 8–23)
CO2: 25 mmol/L (ref 22–32)
Calcium: 8.4 mg/dL — ABNORMAL LOW (ref 8.9–10.3)
Chloride: 104 mmol/L (ref 98–111)
Creatinine, Ser: 1.98 mg/dL — ABNORMAL HIGH (ref 0.61–1.24)
GFR calc Af Amer: 40 mL/min — ABNORMAL LOW (ref >60)
GFR calc non Af Amer: 35 mL/min — ABNORMAL LOW (ref >60)
Glucose, Bld: 132 mg/dL — ABNORMAL HIGH (ref 70–99)
Potassium: 3.7 mmol/L (ref 3.5–5.1)
Sodium: 139 mmol/L (ref 135–145)
Total Bilirubin: 0.9 mg/dL (ref 0.3–1.2)
Total Protein: 6.9 g/dL (ref 6.5–8.1)

## 2019-09-10 LAB — BRAIN NATRIURETIC PEPTIDE: B Natriuretic Peptide: 2022.2 pg/mL — ABNORMAL HIGH (ref 0.0–100.0)

## 2019-09-10 NOTE — Patient Instructions (Addendum)
Continue weighing daily and call for an overnight weight gain of > 2 pounds or a weekly weight gain of >5 pounds.   Drink 60-64 ounces of liquid daily   Call us in the future if you'd like to schedule another appointment

## 2019-09-10 NOTE — Telephone Encounter (Signed)
Please refer to corresponding result note.

## 2019-09-11 ENCOUNTER — Inpatient Hospital Stay: Payer: Medicare Other | Attending: Oncology

## 2019-09-11 ENCOUNTER — Inpatient Hospital Stay: Payer: Medicare Other

## 2019-09-11 ENCOUNTER — Inpatient Hospital Stay (HOSPITAL_BASED_OUTPATIENT_CLINIC_OR_DEPARTMENT_OTHER): Payer: Medicare Other | Admitting: Oncology

## 2019-09-11 ENCOUNTER — Telehealth: Payer: Self-pay

## 2019-09-11 ENCOUNTER — Other Ambulatory Visit: Payer: Self-pay

## 2019-09-11 ENCOUNTER — Encounter: Payer: Self-pay | Admitting: Oncology

## 2019-09-11 VITALS — BP 111/69 | HR 79 | Temp 96.2°F | Resp 18 | Wt 187.8 lb

## 2019-09-11 DIAGNOSIS — R0609 Other forms of dyspnea: Secondary | ICD-10-CM

## 2019-09-11 DIAGNOSIS — F17210 Nicotine dependence, cigarettes, uncomplicated: Secondary | ICD-10-CM | POA: Diagnosis not present

## 2019-09-11 DIAGNOSIS — N184 Chronic kidney disease, stage 4 (severe): Secondary | ICD-10-CM

## 2019-09-11 DIAGNOSIS — I509 Heart failure, unspecified: Secondary | ICD-10-CM | POA: Diagnosis not present

## 2019-09-11 DIAGNOSIS — I132 Hypertensive heart and chronic kidney disease with heart failure and with stage 5 chronic kidney disease, or end stage renal disease: Secondary | ICD-10-CM | POA: Diagnosis not present

## 2019-09-11 DIAGNOSIS — R748 Abnormal levels of other serum enzymes: Secondary | ICD-10-CM | POA: Diagnosis not present

## 2019-09-11 DIAGNOSIS — N186 End stage renal disease: Secondary | ICD-10-CM | POA: Diagnosis not present

## 2019-09-11 DIAGNOSIS — C3490 Malignant neoplasm of unspecified part of unspecified bronchus or lung: Secondary | ICD-10-CM | POA: Insufficient documentation

## 2019-09-11 DIAGNOSIS — E1136 Type 2 diabetes mellitus with diabetic cataract: Secondary | ICD-10-CM | POA: Insufficient documentation

## 2019-09-11 DIAGNOSIS — J90 Pleural effusion, not elsewhere classified: Secondary | ICD-10-CM | POA: Diagnosis not present

## 2019-09-11 DIAGNOSIS — R0600 Dyspnea, unspecified: Secondary | ICD-10-CM | POA: Diagnosis not present

## 2019-09-11 DIAGNOSIS — E1122 Type 2 diabetes mellitus with diabetic chronic kidney disease: Secondary | ICD-10-CM | POA: Insufficient documentation

## 2019-09-11 DIAGNOSIS — R7401 Elevation of levels of liver transaminase levels: Secondary | ICD-10-CM | POA: Insufficient documentation

## 2019-09-11 LAB — CBC WITH DIFFERENTIAL/PLATELET
Abs Immature Granulocytes: 0.06 10*3/uL (ref 0.00–0.07)
Basophils Absolute: 0.1 10*3/uL (ref 0.0–0.1)
Basophils Relative: 1 %
Eosinophils Absolute: 0.2 10*3/uL (ref 0.0–0.5)
Eosinophils Relative: 2 %
HCT: 35.9 % — ABNORMAL LOW (ref 39.0–52.0)
Hemoglobin: 12 g/dL — ABNORMAL LOW (ref 13.0–17.0)
Immature Granulocytes: 1 %
Lymphocytes Relative: 9 %
Lymphs Abs: 0.8 10*3/uL (ref 0.7–4.0)
MCH: 29.5 pg (ref 26.0–34.0)
MCHC: 33.4 g/dL (ref 30.0–36.0)
MCV: 88.2 fL (ref 80.0–100.0)
Monocytes Absolute: 0.9 10*3/uL (ref 0.1–1.0)
Monocytes Relative: 11 %
Neutro Abs: 6.4 10*3/uL (ref 1.7–7.7)
Neutrophils Relative %: 76 %
Platelets: 322 10*3/uL (ref 150–400)
RBC: 4.07 MIL/uL — ABNORMAL LOW (ref 4.22–5.81)
RDW: 20.6 % — ABNORMAL HIGH (ref 11.5–15.5)
WBC: 8.5 10*3/uL (ref 4.0–10.5)
nRBC: 0 % (ref 0.0–0.2)

## 2019-09-11 LAB — GAMMA GT: GGT: 552 U/L — ABNORMAL HIGH (ref 7–50)

## 2019-09-11 LAB — COMPREHENSIVE METABOLIC PANEL
ALT: 48 U/L — ABNORMAL HIGH (ref 0–44)
AST: 58 U/L — ABNORMAL HIGH (ref 15–41)
Albumin: 2.4 g/dL — ABNORMAL LOW (ref 3.5–5.0)
Alkaline Phosphatase: 507 U/L — ABNORMAL HIGH (ref 38–126)
Anion gap: 12 (ref 5–15)
BUN: 47 mg/dL — ABNORMAL HIGH (ref 8–23)
CO2: 23 mmol/L (ref 22–32)
Calcium: 8.2 mg/dL — ABNORMAL LOW (ref 8.9–10.3)
Chloride: 103 mmol/L (ref 98–111)
Creatinine, Ser: 2.01 mg/dL — ABNORMAL HIGH (ref 0.61–1.24)
GFR calc Af Amer: 39 mL/min — ABNORMAL LOW (ref >60)
GFR calc non Af Amer: 34 mL/min — ABNORMAL LOW (ref >60)
Glucose, Bld: 139 mg/dL — ABNORMAL HIGH (ref 70–99)
Potassium: 4.4 mmol/L (ref 3.5–5.1)
Sodium: 138 mmol/L (ref 135–145)
Total Bilirubin: 0.8 mg/dL (ref 0.3–1.2)
Total Protein: 7.2 g/dL (ref 6.5–8.1)

## 2019-09-11 NOTE — Progress Notes (Signed)
Patient has recently been evaluated by cardiologist who increased his potassium medication to 4 tabs for 2 days (this past Saturday and Sunday).  Had diarrhea for 2 weeks and has improved for the past 3 days.  No change in chronic SOBr.  Does have nausea that is relieved with Promethazine.

## 2019-09-11 NOTE — Telephone Encounter (Signed)
Oncology labs showed K now at goal 4.0. Patient called and updated that we will continue KCl tab 22mEq and recheck labs to ensure potassium is indeed at goal. Also updated patient's wife that nephrology will be in touch soon.

## 2019-09-11 NOTE — Telephone Encounter (Signed)
-----   Message from Arvil Chaco, PA-C sent at 09/10/2019  8:11 PM EDT ----- Please call the patient and let him know that his potassium is better on recent labs though still on the lower side. Let's increase him to Kcl tab 82mEq daily with  recheck of his renal function in 1 week. Continue on lasix 40mg  daily. If he gains 3 pounds from his current weight in 1 day or greater than 5 pounds in 2 days, please have him contact the office for further assistance as we may adjust his diuretic at that time. Recommend very high protein diet, as well as low salt / fluid intake. He may need a higher dose of diuretic in the near future; however, given his kidney function, recommend he contact nephrology and keep in the loop as well, based on their clinic notes. Will Cc' on this result note as well as PCP.

## 2019-09-11 NOTE — Telephone Encounter (Signed)
Called to give the patient lab results. [email or web address]. Per JV message below the patients appt need to be moved up to next week.    Arvil Chaco, PA-C  09/10/2019 8:40 PM EDT Back to Top    Are we able to get this patient in next week to see [place] sooner?

## 2019-09-11 NOTE — Progress Notes (Signed)
Hematology/Oncologyfollow up  note [facility] [phone] [phone]   Patient Care Team: Tonia Ghent, MD as PCP - General (Family Medicine) Thelma Comp, [place] (Optometry) Telford Nab, RN as Registered Nurse  REFERRING PROVIDER: Tonia Ghent, MD  CHIEF COMPLAINTS/REASON FOR VISIT:  Follow-up for metastatic lung adenocarcinoma  HISTORY OF PRESENTING ILLNESS:   Craig Koch is a  64 y.o.  male with PMH listed below was seen in consultation at the request of  Tonia Ghent, MD  for evaluation of malignant pleural effusion. Patient was recently admitted to [facility] due to large amount of pleural effusion, acute hypoxic respiratory failure. Patient reports feeling shortness of breath and dyspnea on exertion for several weeks and presented to emergency room.  A chest x-ray 08/23/2018 showed a large amount of left pleural effusion 08/24/2018 CT chest abdomen pelvis confirmed large left pleural effusion with near complete collapse of the left lower lobe and extensive volume loss in the lingula and a portion of the left upper lobe. No acute intra-abdominal process.  Cholelithiasis, diverticulitis, coronary atherosclerosis post CABG.  Bilateral renal cortical scarring.  Aortic atherosclerosis.  #08/24/2018 ultrasound guided diagnostic and therapeutic thoracentesis removed 1.8 L of pleural fluid. Patient subsequently felt better and was discharged to follow-up with pulmonology. 09/04/2018 patient followed up with Dr.Aleskerov chest x-ray showed recurrent moderate left pleural effusion.  Patient underwent ultrasound-guided left thoracentesis again and it drained 3 L of fluid. Pleural fluid cytology positive for adenocarcinoma. Patient was referred to me for further evaluation and management.  # PET eft sided pleural hypermetabolic activity  consistent with malignant pleural effusion. Left upper and lower lobe low-level hypermetabolic him  corresponding to areas of airspace and groundglass opacity.  This was felt to be secondary to atelectasis.  Underlying left upper lobe primary bronchogenic carcinoma cannot be excluded. Medial left upper lobe area more focal hypermetabolic area could represent a pleural disease or a site of small left upper lobe primary. No evidence of extra thoracic hypermetabolic metastatic disease.  Left sided hydro-pneumothorax within the pleural air component being new since the prior CT. Cholelithiasis.  #MRI brain showed punctate focus of enhancement within the right parietal lobe cortex suspicious for possible metastasis. Molecular study showed BRAF V600 mutation. 11/16/2018 started on dabrafenib 150 mg twice daily and trametinib 2 mg daily.  He had good response  #Patient was admitted from 03/03/2019-03/19/2019 due to DKA due to uncontrolled diabetes, acute decompensation of CHF left lower lobe pneumonia right pleural effusion status post thoracentesis.  Fluid study was consistent with transudate. AKI secondary to ATN, on hemodialysis Tuesday Thursday and Saturday, atrial fibrillation with RVR, paroxysmal.  Patient was discharged on Eliquis and amiodarone. Patient was readmitted from 03/26/2019-04/01/2019 due to CHF exacerbation, right pleural effusion recurrent.  Status post diagnostic and therapeutic thoracentesis.  Cytology was negative for malignant cells.   INTERVAL HISTORY AARYAN ESSMAN is a 64 y.o. male who has above history reviewed by me today presents for follow-up of lung cancer Problems and complaints are listed below:  Patient was accompanied by his wife today. Patient has had increased shortness of breath and was recently seen by cardiology.  BNP continues to be significantly elevated and trending up.  Chronic bilateral lower extremity swelling.  Shortness of breath with minimal exertion.  Review of Systems  Constitutional: Positive for fatigue. Negative for appetite change, chills,  diaphoresis and fever.  HENT:   Negative for hearing loss, lump/mass, nosebleeds, sore throat and voice change.  Eyes: Negative for eye problems and icterus.  Respiratory: Positive for shortness of breath. Negative for chest tightness, cough, hemoptysis and wheezing.   Cardiovascular: Positive for leg swelling. Negative for chest pain.       Rales bilaterally  Gastrointestinal: Negative for abdominal distention, abdominal pain, blood in stool, diarrhea, nausea and rectal pain.  Endocrine: Negative for hot flashes.  Genitourinary: Negative for bladder incontinence, difficulty urinating, dysuria, frequency, hematuria and nocturia.   Musculoskeletal: Negative for arthralgias, back pain, flank pain, gait problem and myalgias.  Skin: Negative for itching and rash.  Neurological: Negative for dizziness, gait problem, headaches, light-headedness, numbness and seizures.  Hematological: Negative for adenopathy. Does not bruise/bleed easily.  Psychiatric/Behavioral: Negative for confusion and decreased concentration. The patient is not nervous/anxious.     MEDICAL HISTORY:  Past Medical History:  Diagnosis Date  . Anxiety   . Arthritis   . CHF (congestive heart failure) ([place])   . Coronary artery disease   . Diabetes mellitus   . Dyslipidemia   . Hx of CABG   . Hyperlipidemia   . Hypertension   . Malignant neoplasm of unspecified part of unspecified bronchus or lung ([place]) 08/2018   Immunotherapy  . Pleural effusion     SURGICAL HISTORY: Past Surgical History:  Procedure Laterality Date  . CATARACT EXTRACTION Left 09/2015  . CHEST TUBE INSERTION Left 10/01/2018   Procedure: INSERTION PLEURAL DRAINAGE CATHETER;  Surgeon: [name], MD;  Location: [facility] ORS;  Service: Thoracic;  Laterality: Left;  . CORONARY ARTERY BYPASS GRAFT  2004   (CABG with LIMA to the  LAD, SVG to OM2/OM3, SVG  to diag  . DIALYSIS/PERMA CATHETER INSERTION N/A 03/14/2019   Procedure: DIALYSIS/PERMA CATHETER  INSERTION;  Surgeon: Algernon Huxley, MD;  Location: [place] [facility];  Service: Cardiovascular;  Laterality: N/A;  . DIALYSIS/PERMA CATHETER REMOVAL N/A 05/28/2019   Procedure: DIALYSIS/PERMA CATHETER REMOVAL;  Surgeon: Katha Cabal, MD;  Location: [place] [facility];  Service: Cardiovascular;  Laterality: N/A;  . Left ankle surgery     repair of fracture  . Right lower leg surgery     rod  . TEMPORARY DIALYSIS CATHETER N/A 03/11/2019   Procedure: TEMPORARY DIALYSIS CATHETER;  Surgeon: Algernon Huxley, MD;  Location: [place] [facility];  Service: Cardiovascular;  Laterality: N/A;   SOCIAL HISTORY: Social History   Socioeconomic History  . Marital status: Married    Spouse name: Not on file  . Number of children: Not on file  . Years of education: Not on file  . Highest education level: Not on file  Occupational History  . Not on file  Tobacco Use  . Smoking status: Current Every Day Smoker    Packs/day: 0.20    Years: 45.00    Pack years: 9.00    Types: Cigarettes    Last attempt to quit: 03/05/2019    Years since quitting: 0.5  . Smokeless tobacco: Former Systems developer    Types: Snuff  Vaping Use  . Vaping Use: Never used  Substance and Sexual Activity  . Alcohol use: Not Currently    Alcohol/week: 6.0 standard drinks    Types: 6 Cans of beer per week    Comment: occ, average 6 pack in a week  . Drug use: No  . Sexual activity: Not Currently  Other Topics Concern  . Not on file  Social History Narrative   On disability 2009 after prev injuries and CAD.     Married 1976  2 kids, 4 grandkids.    Social Determinants of Health   Financial Resource Strain:   . Difficulty of Paying Living Expenses: Not on file  Food Insecurity:   . Worried About Charity fundraiser in the Last Year: Not on file  . Ran Out of Food in the Last Year: Not on file  Transportation Needs:   . Lack of Transportation (Medical): Not on file  . Lack of Transportation (Non-Medical): Not on file    Physical Activity:   . Days of Exercise per Week: Not on file  . Minutes of Exercise per Session: Not on file  Stress:   . Feeling of Stress : Not on file  Social Connections:   . Frequency of Communication with Friends and Family: Not on file  . Frequency of Social Gatherings with Friends and Family: Not on file  . Attends Religious Services: Not on file  . Active Member of Clubs or Organizations: Not on file  . Attends Archivist Meetings: Not on file  . Marital Status: Not on file  Intimate Partner Violence:   . Fear of Current or Ex-Partner: Not on file  . Emotionally Abused: Not on file  . Physically Abused: Not on file  . Sexually Abused: Not on file     FAMILY HISTORY: Family History  Problem Relation Age of Onset  . Dementia Mother   . Heart disease Father   . Colon cancer Neg Hx   . Prostate cancer Neg Hx   . Diabetes Neg Hx    ALLERGIES:  Allergies  Allergen Reactions  . Promethazine     QT prolongation.   . Trazodone And Nefazodone     QT prolongation.   . Lipitor [Atorvastatin Calcium] Other (See Comments)    Aches.  Tolerated crestor.      MEDICATIONS: PHYSICAL EXAMINATION: Current Outpatient Medications on File Prior to Visit  Medication Sig Dispense Refill  . albuterol (VENTOLIN HFA) 108 (90 Base) MCG/ACT inhaler Inhale 2 puffs into the lungs every 6 (six) hours as needed for wheezing or shortness of breath. 18 g 0  . amiodarone (PACERONE) 200 MG tablet TAKE 1 TABLET BY MOUTH EVERY DAY 90 tablet 0  . apixaban (ELIQUIS) 5 MG TABS tablet Take 1 tablet (5 mg total) by mouth 2 (two) times daily. 60 tablet 5  . bismuth subsalicylate (PEPTO-BISMOL) 262 MG/15ML suspension Take 30 mLs by mouth every 6 (six) hours as needed for diarrhea or loose stools. 360 mL 0  . clopidogrel (PLAVIX) 75 MG tablet TAKE 1 TABLET BY MOUTH EVERY DAY 90 tablet 1  . DULoxetine (CYMBALTA) 30 MG capsule Take 1 capsule (30 mg total) by mouth daily. 30 capsule 0  .  ezetimibe (ZETIA) 10 MG tablet Take 1 tablet (10 mg total) by mouth daily. 30 tablet 5  . ferrous sulfate 325 (65 FE) MG tablet TAKE 1 TABLET (325 MG TOTAL) BY MOUTH 2 (TWO) TIMES DAILY WITH A MEAL. 161 tablet 0  . folic acid (FOLVITE) 1 MG tablet Take 1 tablet (1 mg total) by mouth daily. (Patient taking differently: Take 400 mcg by mouth daily. )    . furosemide (LASIX) 40 MG tablet If lightheaded or SBP <100, then take 49m lasix a day.  If not lightheaded and SBP >100, then take 1278mlasix a day. 30 tablet   . insulin aspart (NOVOLOG) 100 UNIT/ML injection Inject 0-8 Units into the skin as directed. Take 0 units if CBG 70-150 Take 1 units  if CBG 151-200 Take 2 units if CBG 201-250 Take 3 units if CBG 251-300 Take 4 units if CBG 301-350 Take 6 units if CBG 351-400 If CBG > 400, give 8 units and call MD 10 mL   . insulin detemir (LEVEMIR) 100 UNIT/ML injection Inject 0.2 mLs (20 Units total) into the skin at bedtime.    [name] ipratropium-albuterol (DUONEB) 0.5-2.5 (3) MG/3ML SOLN Take 3 mLs by nebulization 4 (four) times daily.    . metoprolol succinate (TOPROL-XL) 25 MG 24 hr tablet Take 0.5 tablets (12.5 mg total) by mouth daily. 45 tablet 1  . Multiple Vitamin (MULTIVITAMIN WITH MINERALS) TABS tablet Take 1 tablet by mouth at bedtime.    . multivitamin (RENA-VIT) TABS tablet Take 1 tablet by mouth at bedtime.  0  . potassium chloride (KLOR-CON) 10 MEQ tablet Take 1 tablet (10 mEq total) by mouth daily. 30 tablet 1  . promethazine (PHENERGAN) 25 MG tablet Take 25 mg by mouth every 6 (six) hours as needed for nausea or vomiting.    . rosuvastatin (CRESTOR) 10 MG tablet TAKE 1 TABLET (10 MG TOTAL) BY MOUTH DAILY. 90 tablet 3   No current facility-administered medications on file prior to visit.    ECOG PERFORMANCE STATUS:2  Today's Vitals   09/11/19 0911  BP: 111/69  Pulse: 79  Resp: 18  Temp: (!) 96.2 F (35.7 C)  SpO2: 95%  Weight: 187 lb 12.8 oz (85.2 kg)  PainSc: 0-No pain    Body mass index is 25.47 kg/m.  Physical Exam Constitutional:      General: He is not in acute distress.    Comments: Patient sits in the wheelchair.  HENT:     Head: Normocephalic and atraumatic.  Eyes:     General: No scleral icterus.    Pupils: Pupils are equal, round, and reactive to light.  Cardiovascular:     Rate and Rhythm: Normal rate and regular rhythm.     Heart sounds: Normal heart sounds.  Pulmonary:     Effort: Pulmonary effort is normal. No respiratory distress.     Breath sounds: No wheezing.     Comments: Decreased breath sound bilaterally. Abdominal:     General: Bowel sounds are normal. There is no distension.     Palpations: Abdomen is soft. There is no mass.     Tenderness: There is no abdominal tenderness.  Musculoskeletal:        General: Swelling present. No deformity. Normal range of motion.     Cervical back: Normal range of motion and neck supple.  Skin:    General: Skin is warm and dry.     Findings: No erythema or rash.  Neurological:     Mental Status: He is alert. Mental status is at baseline.     Cranial Nerves: No cranial nerve deficit.     Coordination: Coordination normal.  Psychiatric:        Mood and Affect: Mood normal.     LABORATORY DATA:  I have reviewed the data as listed Lab Results  Component Value Date   WBC 18.9 (H) 11/09/2018   HGB 11.0 (L) 11/09/2018   HCT 33.5 (L) 11/09/2018   MCV 89.8 11/09/2018   PLT 581 (H) 11/09/2018   Recent Labs    10/19/18 0841 11/02/18 1238 11/09/18 0833  NA 137 132* 131*  K 4.0 4.0 3.5  CL 104 95* 94*  CO2 _0 GLUCOSE 252* 425* 361*  BUN 14 15 12  CREATININE 0.61 0.84 0.77  CALCIUM 8.5* 8.5* 8.0*  GFRNONAA >60 >60 >60  GFRAA >60 >60 >60  PROT 6.0* 7.0 6.5  ALBUMIN 3.0* 2.6* 2.3*  AST 14* 11* 12*  ALT _0 ALKPHOS 69 75 63  BILITOT 0.5 0.9 0.8   Iron/TIBC/Ferritin/ %Sat No results found for: IRON, TIBC, FERRITIN, IRONPCTSAT    RADIOGRAPHIC STUDIES: I have  personally reviewed the radiological images as listed and agreed with the findings in the report. DG Chest 2 View  Result Date: 07/29/2019 CLINICAL DATA:  Cough EXAM: CHEST - 2 VIEW COMPARISON:  06/19/2019 FINDINGS: Prior CABG. Heart is normal size. Continued bilateral pleural effusions versus pleural thickening, left greater than right. Improved patchy opacity in the left mid and lower lung. Increasing density in the right mid lung and right base. Underlying chronic lung disease suspected. IMPRESSION: Stable bilateral pleural effusions or pleural thickening, left greater than right. Improving left mid lung and lower lung airspace disease with worsening right mid and lower lung airspace opacities. Findings concerning for pneumonia superimposed on chronic lung disease. Electronically Signed   By: Rolm Baptise M.D.   On: 07/29/2019 08:50   DG Chest 2 View  Result Date: 06/19/2019 CLINICAL DATA:  History of lung cancer short of breath EXAM: CHEST - 2 VIEW COMPARISON:  05/14/2019, CT 03/14/2019 FINDINGS: Post sternotomy changes. Removal of right-sided central venous catheter. No significant radiographic change in appearance of the chest since 05/14/2019 with small left greater than right pleural effusions and patchy foci of airspace disease and ground-glass opacity in the left greater than right thorax. Stable cardiomediastinal silhouette. Aortic atherosclerosis. IMPRESSION: No significant change since 05/14/2019. Persistent left greater than right pleural effusions with consolidations and ground-glass densities in the left greater than right lungs. CT chest follow-up may be considered given lack of resolution for the findings on the left. Electronically Signed   By: Donavan Foil M.D.   On: 06/19/2019 21:14   MR Brain Wo Contrast  Result Date: 07/12/2019 CLINICAL DATA:  Lung cancer staging EXAM: MRI HEAD WITHOUT CONTRAST TECHNIQUE: Multiplanar, multiecho pulse sequences of the brain and surrounding structures  were obtained without intravenous contrast. COMPARISON:  03/05/2019 FINDINGS: Brain: No swelling or masslike finding disease. The patient has end-stage renal disease. Cerebral volume loss and mild periventricular and pontine chronic small vessel ischemia. No recent infarct, hydrocephalus, collection, or shift. Vascular: Normal flow voids Skull and upper cervical spine: No focal lesion is noted. Degenerative facet spurring. Sinuses/Orbits: Chronic right maxillary sinusitis with mucosal thickening and atelectasis. Left cataract resection. IMPRESSION: No evidence of metastatic disease. Electronically Signed   By: Monte Fantasia M.D.   On: 07/12/2019 09:59   NM PET Image Restag (PS) Skull Base To Thigh  Result Date: 07/18/2019 CLINICAL DATA:  Subsequent treatment strategy for lung carcinoma. Immunotherapy ongoing. EXAM: NUCLEAR MEDICINE PET SKULL BASE TO THIGH TECHNIQUE: 10.9 mCi F-18 FDG was injected intravenously. Full-ring PET imaging was performed from the skull base to thigh after the radiotracer. CT data was obtained and used for attenuation correction and anatomic localization. Fasting blood glucose: 113 mg/dl COMPARISON:  PET-CT 09/18/2018 chest CT 03/14/2019. FINDINGS: Mediastinal blood pool activity: SUV max 2.38 Liver activity: SUV max NA NECK: No hypermetabolic lymph nodes in the neck. Incidental CT findings: none CHEST: No hypermetabolic mediastinal or hilar nodes. No suspicious pulmonary nodules on the CT scan. Incidental CT findings: Dense airspace disease in the right lower lobe. More diffuse airspace disease throughout the lungs. Moderate right effusion  similar to prior. ABDOMEN/PELVIS: No abnormal hypermetabolic activity within the liver, pancreas, adrenal glands, or spleen. No hypermetabolic lymph nodes in the abdomen or pelvis. Incidental CT findings: none SKELETON: No focal hypermetabolic activity to suggest skeletal metastasis. Incidental CT findings: none IMPRESSION: 1.  No evidence of lung  cancer recurrence or metastatic disease. 2. Dense airspace disease in the right lower lobe on the background of more diffuse airspace disease. Recommend clinical correlation for pneumonia or immunotherapy related pneumonitis These results will be called to the ordering clinician or representative by the Radiologist Assistant, and communication documented in the PACS or [employer]. Electronically Signed   By: Suzy Bouchard M.D.   On: 07/18/2019 14:13   DG Shoulder Left  Result Date: 07/12/2019 CLINICAL DATA:  Left shoulder pain EXAM: LEFT SHOULDER - 2+ VIEW COMPARISON:  Chest radiograph dated 06/19/2019 FINDINGS: There is no evidence of fracture or dislocation. There is no evidence of arthropathy or other focal bone abnormality. Soft tissues are unremarkable. Median sternotomy wires are noted. IMPRESSION: Negative. Electronically Signed   By: Zerita Boers M.D.   On: 07/12/2019 10:00     ASSESSMENT & PLAN:  1. Alkaline phosphatase elevation   2. Malignant neoplasm of unspecified part of unspecified bronchus or lung ([place])   3. Stage 4 chronic kidney disease ([place])   4. Exertional dyspnea    #Stage IV metastatic lung adenocarcinoma TxNx M1 Omniseq showed [id number] mutation, results were discussed with patient and wife. BRAF targeted therapy was discontinued due to patient's recent acute illness including acute CHF, DKA, renal failure on hemodialysis.  Off hemodialysis. Labs are reviewed and discussed with patient. PET scan results were independently reviewed and discussed with patient.  No evidence of lung cancer recurrence or metastatic disease.  He is in complete remission. Labs are reviewed and discussed with patient.  Hold Keytruda due to progressively increased alkaline phosphatase and transaminitis.  #Transaminitis and alkaline phosphatase elevation I will check GGT.  Possible side effects from immunotherapy versus due to congested liver due to CHF.  Hold immunotherapy  today.   Chronic dyspnea with exertion due to COPD and CHF.[name]  BNP is trending up.  Advised patient to follow-up with primary care and cardiology for further evaluation and management of CHF.  #Bilateral lower extremity edema, likely secondary to CHF as well as CKD. Continue follow-up palliative care  Follow-up in 2 weeks.   Earlie Server, MD, PhD Hematology Oncology [place] at [facility] 09/11/2019

## 2019-09-13 DIAGNOSIS — Z01818 Encounter for other preprocedural examination: Secondary | ICD-10-CM | POA: Diagnosis not present

## 2019-09-13 DIAGNOSIS — J449 Chronic obstructive pulmonary disease, unspecified: Secondary | ICD-10-CM | POA: Diagnosis not present

## 2019-09-15 ENCOUNTER — Other Ambulatory Visit: Payer: Self-pay | Admitting: Family Medicine

## 2019-09-17 ENCOUNTER — Ambulatory Visit (INDEPENDENT_AMBULATORY_CARE_PROVIDER_SITE_OTHER): Payer: Medicare Other | Admitting: Physician Assistant

## 2019-09-17 ENCOUNTER — Other Ambulatory Visit: Payer: Self-pay | Admitting: Family Medicine

## 2019-09-17 ENCOUNTER — Encounter: Payer: Self-pay | Admitting: Physician Assistant

## 2019-09-17 ENCOUNTER — Other Ambulatory Visit: Payer: Self-pay

## 2019-09-17 VITALS — BP 120/70 | HR 102 | Ht 72.0 in | Wt 197.1 lb

## 2019-09-17 DIAGNOSIS — E1121 Type 2 diabetes mellitus with diabetic nephropathy: Secondary | ICD-10-CM

## 2019-09-17 DIAGNOSIS — Z7901 Long term (current) use of anticoagulants: Secondary | ICD-10-CM

## 2019-09-17 DIAGNOSIS — I480 Paroxysmal atrial fibrillation: Secondary | ICD-10-CM

## 2019-09-17 DIAGNOSIS — Z87448 Personal history of other diseases of urinary system: Secondary | ICD-10-CM

## 2019-09-17 DIAGNOSIS — Z794 Long term (current) use of insulin: Secondary | ICD-10-CM

## 2019-09-17 DIAGNOSIS — I5022 Chronic systolic (congestive) heart failure: Secondary | ICD-10-CM | POA: Diagnosis not present

## 2019-09-17 DIAGNOSIS — I10 Essential (primary) hypertension: Secondary | ICD-10-CM | POA: Diagnosis not present

## 2019-09-17 DIAGNOSIS — Z951 Presence of aortocoronary bypass graft: Secondary | ICD-10-CM

## 2019-09-17 DIAGNOSIS — I2510 Atherosclerotic heart disease of native coronary artery without angina pectoris: Secondary | ICD-10-CM

## 2019-09-17 DIAGNOSIS — N1832 Chronic kidney disease, stage 3b: Secondary | ICD-10-CM

## 2019-09-17 DIAGNOSIS — C3490 Malignant neoplasm of unspecified part of unspecified bronchus or lung: Secondary | ICD-10-CM

## 2019-09-17 DIAGNOSIS — Z87898 Personal history of other specified conditions: Secondary | ICD-10-CM

## 2019-09-17 DIAGNOSIS — I252 Old myocardial infarction: Secondary | ICD-10-CM

## 2019-09-17 NOTE — Patient Instructions (Signed)
Medication Instructions:  Your physician recommends that you continue on your current medications as directed. Please refer to the Current Medication list given to you today.  *If you need a refill on your cardiac medications before your next appointment, please call your pharmacy*   Lab Work: none ordered If you have labs (blood work) drawn today and your tests are completely normal, you will receive your results only by: [name] MyChart Message (if you have MyChart) OR . A paper copy in the mail If you have any lab test that is abnormal or we need to change your treatment, we will call you to review the results.   Testing/Procedures: none ordered   Follow-Up: At [facility], you and your health needs are our priority.  As part of our continuing mission to provide you with exceptional heart care, we have created designated Provider Care Teams.  These Care Teams include your primary Cardiologist (physician) and Advanced Practice Providers (APPs -  Physician Assistants and Nurse Practitioners) who all work together to provide you with the care you need, when you need it.  We recommend signing up for the patient portal called "MyChart".  Sign up information is provided on this After Visit Summary.  MyChart is used to connect with patients for Virtual Visits (Telemedicine).  Patients are able to view lab/test results, encounter notes, upcoming appointments, etc.  Non-urgent messages can be sent to your provider as well.   To learn more about what you can do with MyChart, go to [email or web address].    Your next appointment:   3 month(s)  The format for your next appointment:   In Person  Provider:     You may see Ida Rogue, MD or Marrianne Mood, PA-C

## 2019-09-17 NOTE — Progress Notes (Signed)
Office Visit    Patient Name: Craig Koch Date of Encounter: 09/17/2019  Primary Care Provider:  Tonia Ghent, MD Primary Cardiologist:  Ida Rogue, MD  Chief Complaint    Chief Complaint  Patient presents with  . office visit    2 week F/U after labwork; Meds verbally reviewed with patient.    64 yo male with history of CAD s/p four-vessel CABG, HFrEF, A. 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  Past Medical History    Past Medical History:  Diagnosis Date  . Anxiety   . Arthritis   . CHF (congestive heart failure) ([place])   . Coronary artery disease   . Diabetes mellitus   . Dyslipidemia   . Hx of CABG   . Hyperlipidemia   . Hypertension   . Malignant neoplasm of unspecified part of unspecified bronchus or lung ([place]) 08/2018   Immunotherapy  . Pleural effusion    Past Surgical History:  Procedure Laterality Date  . CATARACT EXTRACTION Left 09/2015  . CHEST TUBE INSERTION Left 10/01/2018   Procedure: INSERTION PLEURAL DRAINAGE CATHETER;  Surgeon: [name], MD;  Location: [facility] ORS;  Service: Thoracic;  Laterality: Left;  . CORONARY ARTERY BYPASS GRAFT  2004   (CABG with LIMA to the  LAD, SVG to OM2/OM3, SVG  to diag  . DIALYSIS/PERMA CATHETER INSERTION N/A 03/14/2019   Procedure: DIALYSIS/PERMA CATHETER INSERTION;  Surgeon: Algernon Huxley, MD;  Location: [place] [facility];  Service: Cardiovascular;  Laterality: N/A;  . DIALYSIS/PERMA CATHETER REMOVAL N/A 05/28/2019   Procedure: DIALYSIS/PERMA CATHETER REMOVAL;  Surgeon: Katha Cabal, MD;  Location: [place] [facility];  Service: Cardiovascular;  Laterality: N/A;  . Left ankle surgery     repair of fracture  . Right lower leg surgery     rod  . TEMPORARY DIALYSIS CATHETER N/A 03/11/2019   Procedure: TEMPORARY DIALYSIS  CATHETER;  Surgeon: Algernon Huxley, MD;  Location: [place] [facility];  Service: Cardiovascular;  Laterality: N/A;    Allergies  Allergies  Allergen Reactions  . Promethazine     QT prolongation.   . Trazodone And Nefazodone     QT prolongation.   . Lipitor [Atorvastatin Calcium] Other (See Comments)    Aches.  Tolerated crestor.     History of Present Illness    Craig Koch is a 64 y.o. male with PMH as above.  He has history of 2010 stress test with EF 45% and fixed defect involving the inferior wall, most consistent with scar versus hibernating myocardium. Per patient, he also had a stress test in 2012 ruled normal. He is a current smoker.   He was admitted to [facility] 08/2018 with large pleural effusion and underwent thoracentesis. 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 He has reportedly been followed by oncology and responded well to treatment thus far.   He was seen in the office 09/25/2017 and denied chest pain.  No medication changes. Weight loss, dietary changes, and smoking cessation were advised.   He seen at [facility] 03/03/19 after presenting with SOB and found to be in new onset Afib with RVR, AKI requiring temporary HD, NSTEMI, and new HFrEF.  He was started on amiodarone with improvement in ventricular rate.  He received several doses of digoxin,later discontinued due  to his renal failure.  HS Tn <27,000 with suspicion for graft occlusion.  Echo showed moderately reduced LVSF.  He was continued on medical therapy with cath deferred.  Severe hyperglycemia and hyponatremia noted.  Temporary dialysis catheter placed and removed by the end of admission with tunneled right IJ hemodialysis catheter placed.    Apixaban 5 mg twice daily was not actually prescribed at discharge and restarted at RTC. ACE/ARB/ARNI was not added given his soft BP and renal function.   At RTC. he was walking regularly.   He was down to 1 cigarette per day.   RTC 5/21, he noted  intermittent dizziness, fatigue, DOE, PND, and wt gain 176lbs  181lbs. TSH elevated though improved with amiodarone continued after discussion of TSH with primary cardiologist.   Following nephrology removal of catheter, he was started on lasix, escalated to lasix 40mg  qod. 7/22 CCK nephrology visit with LEE 2+ on exam with suspected multifactorial etiology, including hypoalbuminemia. Protein intake was encouraged.No indication for Procrit. Secondary hyperparathyroidism was noted to be improved.   He was seen 5/26 by the heart failure clinic with furosemide continued.   On 7/26, he was seen by oncology for follow-up of stage IV metastatic lung adenocarcinoma. He reported SOB/DOE. It was noted 07/18/2019 PET showed no evidence of lung cancer recurrence or metastatic disease. Dense airspace disease in RLL was concerning for pneumonia or pneumonitis, which was discussed with Dr. Tasia Catchings and recommendation was for treating pneumonia. He denied relief with doxycycline and prednisone. He was seen by pulmonology and started on nebulizer without improvement. He noted intermittent cough. 7/26 CXR was concern for worsening right mid and lower lung airspace opacities. 07/2019 BNP 1318. He was started on Levaquin.  He was seen in clinic 07/31/19 with DOE and PND, though he was now able to walk across the kitchen or for at least 5 minutes. He noted increase in LEE.  He continued to smoke and had increased to 3 cigarettes/day but planned to start nicotine patches. He reported dark stool d/t iron supplementation.  Lasix increased to 40 mg daily for 3 to 5 days then dropped back to 40 mg qod with repeat labs.  Seen by oncology and noted he was in complete remission.  Plan was to hold off immunotherapy and complete Levaquin with follow-up with pulmonology for further evaluation.  Lasix qod.  On 08/12/2019, he called his PCP with report of burning and pain with urination and hallucinations. Trazodone 50 mg prescribed at night for  sleep and with improvement, though he was sleeping more during the day. He reported more LEE and dyspnea but less shortness of breath with rest. He had some queasiness, resolving with Phenergan.  Started on  Keflex for UTI.    He was increased to Lasix 40 mg daily.   8/12 Cr 2.04 with BUN 42 and K3.8 (glucose 345).  Albumin 3.0.  BNP 1476.0.  08/20/2019, Lasix increased to 80 mg daily for 5 days.   8/18 creatinine 1.77 with BUN 36 and K3.6 (glucose 394).  Albumin 2.6.  On 8/18, he saw his oncologist with ongoing sx on 40 Lasix daily.  BP was borderline low in the clinic with some lightheadedness reported.  Recommendation was to call his nephrologist.  Alkaline phosphatase was monitored thought 2/2 immunotherapy.   08/23/2019 labs showed Cr 1.70, BUN 35, K3.0.   He was started on KCl tab 10 M EQ daily.  On 08/25/2019, he called the office regarding cost of Eliquis, as he was in  the donut hole.  Samples provided.  On 8/26, he called his PCP with report of ongoing symptoms of LEE, decreased at night and into the morning but increased by lunchtime of the next day.  Compression stockings were recommended.  Sliding scale Lasix with SBP <100, take Lasix 80 mg daily.  If not lightheaded and SBP> 100, take 120 mg Lasix daily. He reports that he had been taking Lasix 120 mg daily since that time.   On 09/06/2019, he RTC with unchanged sx, still taking Lasix 120 mg daily. CCK follow-up encouraged  Discussed likely multifactorial etiology of his lower extremity edema.  Echoed PCP recommendations for compression stockings, as well as recommended leg elevation.  He reports his legs feel heavy; therefore, he is relatively sedentary. Discussed that some of his fatigue/dyspnea could be 2/2 deconditioning.  SpO2 88%  86% ORA  90% ORA (taken several times throughout the visit).  He noted new diarrhea for the past 6 days to 2 weeks, suspected as 2/2 his antibiotics.  He originally was on Imodium; however, per PCP  recommended he instead take Pepto-Bismol from a cardiac standpoint, and this recommendation was echoed today.  With his diarrhea, he reportst. He was staying well-hydrated with 8 glasses of water though2-3 sodas per day.  He is drinking diet Pepsi, diet ginger ale, and diet [place] with recommendation to cut back on soda. Adequate appetite though with diet of soup, waffles, bacon, and eggs.  Reviewed recommendation for low-salt diet and increased protein and fiber.   He reported home BPs 127/65 and oxygen saturations are normally in the 90s (has a BP cuff and pulse ox at home).  Today, 09/17/2019 he returns to clinic and notes he is doing overall well since his previous visit.  He has been started on prednisone, and since that time, he reports that he is able to ambulate 4 times the length of the clinic room without significant dyspnea as he had during his previous visit.  He reports him increased energy and ability to do things, though is still limited by his lower extremity edema and difficulty with moving his swollen legs.  He denies any chest pain, racing heart rate, or palpitations, despite his sinus tachycardia at 103 bpm today.  He reports improvement in presyncope.  No loss of consciousness or recent falls.  He reports ongoing lower extremity edema with recommendations as below.  His edema is worse in the late afternoons and evening and resolves overnight with slow increase in the morning and into the afternoon.  He denies any abdominal distention.  He has been decreasing his fluid intake.  He is drinking 6 to 8 glasses/day and 1 soda per day.  He is taking his Lasix 40 mg twice daily.   On review of clinic weight, he has gained weight since his 09/06/2019 clinic visit and currently weighs 197 pounds with previous clinic weight 180 pounds. He reports continued urine output.  BP well controlled at home.  He overall feels improved since starting his prednisone.  He is no longer taking trazodone at night with  improved QTC on today's EKG and notes that he is sleeping well.  He reports home oxygen saturations from 92 to 96%.  He recently was seen by pulmonology and ordered home oxygen.  He has an upcoming visit with nephrology.  He reports that he was seen by the heart failure clinic and informed that he no longer needs to follow with Darylene Price NP.  Home Medications  Prior to Admission medications   Medication Sig Start Date End Date Taking? Authorizing Provider  acetaminophen (TYLENOL) 325 MG tablet Take 2 tablets (650 mg total) by mouth every 6 (six) hours as needed for mild pain or fever. 03/19/19   Ezekiel Slocumb, DO  albuterol (VENTOLIN HFA) 108 (90 Base) MCG/ACT inhaler Inhale 2 puffs into the lungs every 6 (six) hours as needed for wheezing or shortness of breath. 04/01/19 05/08/19  Wyvonnia Dusky, MD  amiodarone (PACERONE) 200 MG tablet Take 1 tablet (200 mg total) by mouth daily. 04/23/19 05/23/19  Tonia Ghent, MD  clopidogrel (PLAVIX) 75 MG tablet Take 1 tablet (75 mg total) by mouth daily. 04/23/19 05/23/19  Tonia Ghent, MD  dextromethorphan-guaiFENesin [facility] DM) 30-600 MG 12hr tablet Take 1 tablet by mouth 2 (two) times daily. 03/19/19   Ezekiel Slocumb, DO  dronabinol (MARINOL) 5 MG capsule Take 1 capsule (5 mg total) by mouth 2 (two) times daily before a meal. 04/17/19 05/17/19  Earlie Server, MD  DULoxetine (CYMBALTA) 30 MG capsule Take 1 capsule (30 mg total) by mouth daily. 04/01/19 05/08/19  Wyvonnia Dusky, MD  Ensure Max Protein (ENSURE MAX PROTEIN) LIQD Take 330 mLs (11 oz total) by mouth 2 (two) times daily between meals. 03/19/19   Nicole Kindred A, DO  ferrous sulfate 325 (65 FE) MG tablet Take 1 tablet (325 mg total) by mouth 2 (two) times daily with a meal. 04/23/19 05/23/19  Tonia Ghent, MD  folic acid (FOLVITE) 1 MG tablet Take 1 tablet (1 mg total) by mouth daily. 03/20/19   Nicole Kindred A, DO  insulin aspart (NOVOLOG) 100 UNIT/ML injection Inject 0-8 Units  into the skin as directed. Take 0 units if CBG 70-150 Take 1 units if CBG 151-200 Take 2 units if CBG 201-250 Take 3 units if CBG 251-300 Take 4 units if CBG 301-350 Take 6 units if CBG 351-400 If CBG > 400, give 8 units and call MD 04/15/19   Tonia Ghent, MD  insulin detemir (LEVEMIR) 100 UNIT/ML injection Inject 0.1-0.15 mLs (10-15 Units total) into the skin at bedtime. 04/12/19   Tonia Ghent, MD  levofloxacin (LEVAQUIN) 250 MG tablet Take 2 tablets (500mg ) by mouth x 1 day and then take 1 tablet (250mg ) by mouth daily x 6 days 05/02/19   Borders, Kirt Boys, NP  metoprolol succinate (TOPROL-XL) 25 MG 24 hr tablet Take 0.5 tablets (12.5 mg total) by mouth daily. 04/15/19   Tonia Ghent, MD  multivitamin (RENA-VIT) TABS tablet Take 1 tablet by mouth at bedtime. 03/19/19   Ezekiel Slocumb, DO  polyethylene glycol (MIRALAX / GLYCOLAX) 17 g packet Take 17 g by mouth daily. 03/20/19   Ezekiel Slocumb, DO  promethazine (PHENERGAN) 25 MG tablet Take 1 tablet (25 mg total) by mouth every 6 (six) hours as needed for nausea or vomiting. 04/08/19   Earlie Server, MD  rosuvastatin (CRESTOR) 10 MG tablet TAKE 1 TABLET (10 MG TOTAL) BY MOUTH DAILY. 01/07/19   Tonia Ghent, MD  simethicone (MYLICON) 80 MG chewable tablet Chew 1 tablet (80 mg total) by mouth 4 (four) times daily as needed for flatulence. 03/19/19   Nicole Kindred A, DO  thiamine 100 MG tablet Take 1 tablet (100 mg total) by mouth daily. 03/20/19   Ezekiel Slocumb, DO    Review of Systems    He reports improved dyspnea, improved dizziness with ambulation, and improved fatigue since starting prednisone.  He reports slightly improved bilateral LEE that continues to worsen throughout the day. He denies chest pain, PND, orthopnea, v, syncope,or early satiety.  He has decreased fluid intake.  He reports darker stools 2/2 iron supplementation and not due to bleeding. All other s/sx of bleeding denied.  All other systems reviewed and are  otherwise negative except as noted above.  Physical Exam    VS:  BP 120/70 (BP Location: Right Arm, Patient Position: Sitting, Cuff Size: Large)   Pulse (!) 102   Ht 6' (1.829 m)   Wt 197 lb 2 oz (89.4 kg)   SpO2 92%   BMI 26.73 kg/m  , BMI Body mass index is 26.73 kg/m. GEN: Frail and elderly male present with wife and in wheelchair. HEENT: normal. Neck: Supple, no JVD, L sided carotid bruit, no masses. Cardiac: RRR, 1/6 systolic murmur. No rubs or gallops. No clubbing, cyanosis. 2-3+ bilateral edema (left LEE reportedly always greater than right LEE).  Radials/DP/PT 1+ and equal bilaterally.  Respiratory:  Respirations regular and unlabored, CTAB. GI: Soft, nontender, nondistended, BS + x 4. MS: no deformity or atrophy. Skin: warm and dry, no rash.   Neuro:  Strength and sensation are intact. Psych: Normal affect.  Accessory Clinical Findings    ECG personally reviewed by me today -ST, PR interval 186 ms, IVCD with QRS 114 ms, poor lead placement and aVF and precordial V4-5, QTC improved to 492 ms- no acute changes.  VITALS Reviewed today   Temp Readings from Last 3 Encounters:  09/11/19 (!) 96.2 F (35.7 C)  08/21/19 (!) 96 F (35.6 C) (Tympanic)  08/15/19 (!) 96.9 F (36.1 C) (Temporal)   BP Readings from Last 3 Encounters:  09/17/19 120/70  09/11/19 111/69  09/10/19 103/66   Pulse Readings from Last 3 Encounters:  09/17/19 (!) 102  09/11/19 79  09/10/19 84    Wt Readings from Last 3 Encounters:  09/17/19 197 lb 2 oz (89.4 kg)  09/11/19 187 lb 12.8 oz (85.2 kg)  09/10/19 184 lb 2 oz (83.5 kg)    At last clinic visit, wt 180 lb  LABS  reviewed today   [place] present and most recent? Yes/No: No   [place] kidney labs transcribed below: 7/22 parathyroid hormone: 15,  glucose 270 BUN 52, creatinine 1.79 sodium 140, potassium 4.4, CO2 27, chloride 105, calcium 9.2, phosphorus 2.4,  albumin 2.9,  WBC 8.9, RBC 4.16, hemoglobin 12.3,  hematocrit 38.9, platelets 290  Lab Results  Component Value Date   WBC 8.5 09/11/2019   HGB 12.0 (L) 09/11/2019   HCT 35.9 (L) 09/11/2019   MCV 88.2 09/11/2019   PLT 322 09/11/2019   Lab Results  Component Value Date   CREATININE 2.01 (H) 09/11/2019   BUN 47 (H) 09/11/2019   NA 138 09/11/2019   K 4.4 09/11/2019   CL 103 09/11/2019   CO2 23 09/11/2019   Lab Results  Component Value Date   ALT 48 (H) 09/11/2019   AST 58 (H) 09/11/2019   GGT 552 (H) 09/11/2019   ALKPHOS 507 (H) 09/11/2019   BILITOT 0.8 09/11/2019   Lab Results  Component Value Date   CHOL 85 (L) 05/24/2019   HDL 41 05/24/2019   LDLCALC 32 05/24/2019   LDLDIRECT 31 05/24/2019   TRIG 47 05/24/2019   CHOLHDL 2.1 05/24/2019    Lab Results  Component Value Date   HGBA1C 7.2 (A) 05/14/2019   Lab Results  Component Value Date   TSH  4.742 (H) 08/05/2019     STUDIES/PROCEDURES reviewed today   2D echo 03/04/2019: 1. Left ventricular ejection fraction, by estimation, is 30 to 35%. The  left ventricle has moderately decreased function. The left ventricle  demonstrates global hypokinesis. Left ventricular diastolic parameters are  indeterminate.  2. Right ventricular systolic function is normal. The right ventricular  size is normal. Tricuspid regurgitation signal is inadequate for assessing  PA pressure.  3. Left atrial size was mild to moderately dilated.  4. The mitral valve is normal in structure and function. Moderate mitral  valve regurgitation. No evidence of mitral stenosis.  5. The aortic valve is normal in structure and function. AV  regurgitation is trivial. Mild to moderate aortic valve  sclerosis/calcification is present, without any evidence of aortic  stenosis.  6. The inferior vena cava is normal in size with greater than 50%  respiratory variability, suggesting right atrial pressure of 3 mmHg.   Assessment & Plan   Chronic HFrEF (EF 30 to 35%, 03/04/2019) Chronic DOE / Chronic  Hypoxia --Reports improvement in breathing status with ongoing lower extremity edema.  --Clinic wt 180  197 pounds.  Discharge [facility] from [facility] 3/16 at 190 pounds.  [facility] 07/2019 wt 191lbs. Given these weights, suspect dry weight approximately 190 to 195 pounds. --LEE likely multifactorial as discussed above. --Currently on Lasix 40 mg twice daily with recommendation for ongoing sliding scale lasix (per last adjustment) based on weight and BP.   --Due to his renal function / AOCKD, as well as stable symptoms, will defer to nephrology regarding further titration of his Lasix as discussed with his nephrologist (upcoming visit scheduled). --Cr 1.88  2.01 and BUN 35  47 (9/3  9/8).   --Continue to monitor home weight and BP, symptoms. --Discussed recommendation to decrease fluid intake and limit salt while increasing protein. --Discussed leg elevation and compression stockings.  Given the difficulty with compression stockings in the past, recommended zip up stockings versus Ace wrap bandages.  They will try Ace bandages and elevation. --He has been seen by pulmonology and will be starting home oxygen.  Chronic kidney disease, stage IV --Underwent temporary dialysis during previous admission with improvement in renal function.  PermCath removed. Per CCK, started Lasix 40mg  qd reduced 7/22 to every other day dosing.  He is currently on 40 mg of Lasix twice daily with nephrology contacted and appointment scheduled to reassess diuresis based on his kidney function.    History of malignant left-sided pleural effusion s/p thoracentesis, metastatic dz --Followed by oncology.  H/o recurrent and malignant left-sided pleural effusion and malignant neoplasm.  Recurrent thoracentesis with most recent 05/2019 & repeated 06/2019. L hydro-pneumothorax.  Recent PET scan without recurrent CA/metastatic disease.  He is on antineoplastic immunotherapy.  As above, he will be started on home oxygen.  Chronic obstructive  pulmonary dz  Ongoing tobacco use --Followed by pulmonology.  SpO2 today improved to 92% on room air.  He will continue to monitor his oxygen saturations at home.  Smoking cessation advised.  Paroxysmal atrial fibrillation with RVR  Chronic anticoagulation  Amiodarone therapy; Prolonged QTc; Abnormal TSH --Maintaining NSR on amiodarone 200mg  daily.    --QTc improved on today's EKG with K repletion and discontinuing QTC prolonging medications.  --TSH abnormal on previous labs and acceptable to continue per primary cardiologist.  --Continue periodic monitoring labs / CXR / eye exams as per guidelines on amiodarone.  --ST today on prednisone.  He will continue to monitor his home HR and call  if rate persists over 100bpm. --Continue Eliquis 5mg  BID. No s/sx of bleeding with dark stools chronic and in setting of iron supplementation.    Abnormal TSH  --As above. Discussed with primary cardiologist and recommendation is to continue amiodarone for now   Prolonged QTc --Resolved with repletion of electrolytes and discontinuation of QT prolonging medications.   Hypotension, asx --BP today 120/70.  He reports no recent falls or loss of consciousness.  He reports improvement in his dizziness with ambulation since starting on prednisone.  CAD with CABG x4 --No CP. Not a candidate for cath / PCI as noted in past. Continue Eliquis 5mg  BID in place of ASA 81mg . Continue Plavix 75mg  qd, BB as BP allows, rosuvastatin, and Zetia. Escalation of GDMT with ACE/ARB/ARNI precluded by hypotension and renal function.   Anemia of chronic dz --Likely contributing to LEE and fatigue / DOE / dizziness. No indication for Procrit per CCK's last note.  Defer to nephrology. Continue current iron. Recheck CBC.  Hypoalbuminemia --Likely contributing to his symptoms of LEE and fatigue. Increase protein as above.   HLD --Continue current statin and Zetia. Most recent 03/04/19 LDL 84 and above goal of <70. Reported  statin intolerance. Continue current Crestor 10mg  and Zetia 10mg  daily today. Will defer medication changes for now and per pt.   L sided carotid bruit --Per patient preference, [place] of carotids deferred at previous visit. No amaurosis fugax. Dizziness stable / chronic. Continue current antiplatelet therapy and statin. He knows to get a head CT if he falls and hits his head on [place].  DM2, poorly controlled --Recommend glycemic control for risk factor modification. Increase fiber, protein as above. Follow-up per PCP.   Secondary hyperparathyroidism --Per CCK, no phosphorus binder recommended at this time. Defer to CCK.   Total time spent with patient today 45 minutes. This includes reviewing records, evaluating the patient, and coordinating care. Face-to-face time >50%.  Medication changes: None-ongoing sliding scale Lasix with estimated dry weight 190 to 195 pounds. Labs ordered:None (recent labs per oncology and no medication changes) Studies / Imaging ordered:None.  Future considerations:Titration of diuresis per nephrology, LDL control Disposition:RTC following nephrology visit.  Arvil Chaco, PA-C 09/17/2019

## 2019-09-18 DIAGNOSIS — I5043 Acute on chronic combined systolic (congestive) and diastolic (congestive) heart failure: Secondary | ICD-10-CM | POA: Diagnosis not present

## 2019-09-24 ENCOUNTER — Ambulatory Visit: Payer: Medicare Other | Admitting: Physician Assistant

## 2019-09-25 ENCOUNTER — Telehealth: Payer: Self-pay | Admitting: *Deleted

## 2019-09-25 ENCOUNTER — Encounter: Payer: Self-pay | Admitting: Oncology

## 2019-09-25 ENCOUNTER — Inpatient Hospital Stay: Payer: Medicare Other

## 2019-09-25 ENCOUNTER — Other Ambulatory Visit: Payer: Self-pay

## 2019-09-25 ENCOUNTER — Inpatient Hospital Stay (HOSPITAL_BASED_OUTPATIENT_CLINIC_OR_DEPARTMENT_OTHER): Payer: Medicare Other | Admitting: Oncology

## 2019-09-25 VITALS — BP 121/73 | HR 89 | Temp 96.3°F | Resp 20 | Wt 207.9 lb

## 2019-09-25 DIAGNOSIS — E1122 Type 2 diabetes mellitus with diabetic chronic kidney disease: Secondary | ICD-10-CM | POA: Diagnosis not present

## 2019-09-25 DIAGNOSIS — R410 Disorientation, unspecified: Secondary | ICD-10-CM | POA: Diagnosis not present

## 2019-09-25 DIAGNOSIS — N184 Chronic kidney disease, stage 4 (severe): Secondary | ICD-10-CM | POA: Insufficient documentation

## 2019-09-25 DIAGNOSIS — M7989 Other specified soft tissue disorders: Secondary | ICD-10-CM

## 2019-09-25 DIAGNOSIS — N186 End stage renal disease: Secondary | ICD-10-CM | POA: Diagnosis not present

## 2019-09-25 DIAGNOSIS — C3490 Malignant neoplasm of unspecified part of unspecified bronchus or lung: Secondary | ICD-10-CM

## 2019-09-25 DIAGNOSIS — R748 Abnormal levels of other serum enzymes: Secondary | ICD-10-CM | POA: Diagnosis not present

## 2019-09-25 DIAGNOSIS — J90 Pleural effusion, not elsewhere classified: Secondary | ICD-10-CM | POA: Diagnosis not present

## 2019-09-25 DIAGNOSIS — F17210 Nicotine dependence, cigarettes, uncomplicated: Secondary | ICD-10-CM | POA: Diagnosis not present

## 2019-09-25 DIAGNOSIS — I509 Heart failure, unspecified: Secondary | ICD-10-CM | POA: Diagnosis not present

## 2019-09-25 DIAGNOSIS — R7401 Elevation of levels of liver transaminase levels: Secondary | ICD-10-CM | POA: Diagnosis not present

## 2019-09-25 DIAGNOSIS — E1136 Type 2 diabetes mellitus with diabetic cataract: Secondary | ICD-10-CM | POA: Diagnosis not present

## 2019-09-25 DIAGNOSIS — I132 Hypertensive heart and chronic kidney disease with heart failure and with stage 5 chronic kidney disease, or end stage renal disease: Secondary | ICD-10-CM | POA: Diagnosis not present

## 2019-09-25 LAB — CBC WITH DIFFERENTIAL/PLATELET
Abs Immature Granulocytes: 0.08 10*3/uL — ABNORMAL HIGH (ref 0.00–0.07)
Basophils Absolute: 0.1 10*3/uL (ref 0.0–0.1)
Basophils Relative: 1 %
Eosinophils Absolute: 0 10*3/uL (ref 0.0–0.5)
Eosinophils Relative: 0 %
HCT: 33.9 % — ABNORMAL LOW (ref 39.0–52.0)
Hemoglobin: 11.2 g/dL — ABNORMAL LOW (ref 13.0–17.0)
Immature Granulocytes: 1 %
Lymphocytes Relative: 8 %
Lymphs Abs: 0.9 10*3/uL (ref 0.7–4.0)
MCH: 30.1 pg (ref 26.0–34.0)
MCHC: 33 g/dL (ref 30.0–36.0)
MCV: 91.1 fL (ref 80.0–100.0)
Monocytes Absolute: 0.8 10*3/uL (ref 0.1–1.0)
Monocytes Relative: 8 %
Neutro Abs: 8.6 10*3/uL — ABNORMAL HIGH (ref 1.7–7.7)
Neutrophils Relative %: 82 %
Platelets: 220 10*3/uL (ref 150–400)
RBC: 3.72 MIL/uL — ABNORMAL LOW (ref 4.22–5.81)
RDW: 22.6 % — ABNORMAL HIGH (ref 11.5–15.5)
WBC: 10.4 10*3/uL (ref 4.0–10.5)
nRBC: 0 % (ref 0.0–0.2)

## 2019-09-25 LAB — COMPREHENSIVE METABOLIC PANEL
ALT: 64 U/L — ABNORMAL HIGH (ref 0–44)
AST: 77 U/L — ABNORMAL HIGH (ref 15–41)
Albumin: 2.6 g/dL — ABNORMAL LOW (ref 3.5–5.0)
Alkaline Phosphatase: 290 U/L — ABNORMAL HIGH (ref 38–126)
Anion gap: 9 (ref 5–15)
BUN: 48 mg/dL — ABNORMAL HIGH (ref 8–23)
CO2: 28 mmol/L (ref 22–32)
Calcium: 8.2 mg/dL — ABNORMAL LOW (ref 8.9–10.3)
Chloride: 105 mmol/L (ref 98–111)
Creatinine, Ser: 1.97 mg/dL — ABNORMAL HIGH (ref 0.61–1.24)
GFR calc Af Amer: 40 mL/min — ABNORMAL LOW (ref >60)
GFR calc non Af Amer: 35 mL/min — ABNORMAL LOW (ref >60)
Glucose, Bld: 234 mg/dL — ABNORMAL HIGH (ref 70–99)
Potassium: 4.4 mmol/L (ref 3.5–5.1)
Sodium: 142 mmol/L (ref 135–145)
Total Bilirubin: 0.9 mg/dL (ref 0.3–1.2)
Total Protein: 6.6 g/dL (ref 6.5–8.1)

## 2019-09-25 NOTE — Progress Notes (Signed)
Patient reports his leg edema has gotten worse with L>R and the left leg feels like it is asleep sometime.  He feels that his chronic SOBr has slightly improved.

## 2019-09-25 NOTE — Progress Notes (Signed)
Hematology/Oncologyfollow up  note [facility] [phone] [phone]) [phone]   Patient Care Team: Tonia Ghent, MD as PCP - General (Family Medicine) Thelma Comp, [place] (Optometry) Telford Nab, RN as Registered Nurse  REFERRING PROVIDER: Tonia Ghent, MD  CHIEF COMPLAINTS/REASON FOR VISIT:  Follow-up for metastatic lung adenocarcinoma  HISTORY OF PRESENTING ILLNESS:   Craig Koch is a  64 y.o.  male with PMH listed below was seen in consultation at the request of  Tonia Ghent, MD  for evaluation of malignant pleural effusion. Patient was recently admitted to [facility] due to large amount of pleural effusion, acute hypoxic respiratory failure. Patient reports feeling shortness of breath and dyspnea on exertion for several weeks and presented to emergency room.  A chest x-ray 08/23/2018 showed a large amount of left pleural effusion 08/24/2018 CT chest abdomen pelvis confirmed large left pleural effusion with near complete collapse of the left lower lobe and extensive volume loss in the lingula and a portion of the left upper lobe. No acute intra-abdominal process.  Cholelithiasis, diverticulitis, coronary atherosclerosis post CABG.  Bilateral renal cortical scarring.  Aortic atherosclerosis.  #08/24/2018 ultrasound guided diagnostic and therapeutic thoracentesis removed 1.8 L of pleural fluid. Patient subsequently felt better and was discharged to follow-up with pulmonology. 09/04/2018 patient followed up with Dr.Aleskerov chest x-ray showed recurrent moderate left pleural effusion.  Patient underwent ultrasound-guided left thoracentesis again and it drained 3 L of fluid. Pleural fluid cytology positive for adenocarcinoma. Patient was referred to me for further evaluation and management.  # PET eft sided pleural hypermetabolic activity  consistent with malignant pleural effusion. Left upper and lower lobe low-level hypermetabolic him  corresponding to areas of airspace and groundglass opacity.  This was felt to be secondary to atelectasis.  Underlying left upper lobe primary bronchogenic carcinoma cannot be excluded. Medial left upper lobe area more focal hypermetabolic area could represent a pleural disease or a site of small left upper lobe primary. No evidence of extra thoracic hypermetabolic metastatic disease.  Left sided hydro-pneumothorax within the pleural air component being new since the prior CT. Cholelithiasis.  #MRI brain showed punctate focus of enhancement within the right parietal lobe cortex suspicious for possible metastasis. Molecular study showed BRAF V600 mutation. 11/16/2018 started on dabrafenib 150 mg twice daily and trametinib 2 mg daily.  He had good response  #Patient was admitted from 03/03/2019-03/19/2019 due to DKA due to uncontrolled diabetes, acute decompensation of CHF left lower lobe pneumonia right pleural effusion status post thoracentesis.  Fluid study was consistent with transudate. AKI secondary to ATN, on hemodialysis Tuesday Thursday and Saturday, atrial fibrillation with RVR, paroxysmal.  Patient was discharged on Eliquis and amiodarone. Patient was readmitted from 03/26/2019-04/01/2019 due to CHF exacerbation, right pleural effusion recurrent.  Status post diagnostic and therapeutic thoracentesis.  Cytology was negative for malignant cells.   INTERVAL HISTORY Craig Koch is a 64 y.o. male who has above history reviewed by me today presents for follow-up of lung cancer Problems and complaints are listed below:  Patient was accompanied by his wife today. He reports worsening of lower extremity swelling, left worse than right.  Patient is on Lasix 40 mg daily.  Chronic shortness of breath has slightly improved after being started on prednisone    Review of Systems  Constitutional: Positive for fatigue. Negative for appetite change, chills, diaphoresis and fever.  HENT:   Negative for  hearing loss, lump/mass, nosebleeds, sore throat and voice change.   Eyes:  Negative for eye problems and icterus.  Respiratory: Positive for shortness of breath. Negative for chest tightness, cough, hemoptysis and wheezing.   Cardiovascular: Positive for leg swelling. Negative for chest pain.  Gastrointestinal: Negative for abdominal distention, abdominal pain, blood in stool, diarrhea, nausea and rectal pain.  Endocrine: Negative for hot flashes.  Genitourinary: Negative for bladder incontinence, difficulty urinating, dysuria, frequency, hematuria and nocturia.   Musculoskeletal: Negative for arthralgias, back pain, flank pain, gait problem and myalgias.  Skin: Negative for itching and rash.  Neurological: Negative for dizziness, gait problem, headaches, light-headedness, numbness and seizures.  Hematological: Negative for adenopathy. Does not bruise/bleed easily.  Psychiatric/Behavioral: Negative for confusion and decreased concentration. The patient is not nervous/anxious.     MEDICAL HISTORY:  Past Medical History:  Diagnosis Date  . Anxiety   . Arthritis   . CHF (congestive heart failure) ([place])   . Coronary artery disease   . Diabetes mellitus   . Dyslipidemia   . Hx of CABG   . Hyperlipidemia   . Hypertension   . Malignant neoplasm of unspecified part of unspecified bronchus or lung ([place]) 08/2018   Immunotherapy  . Pleural effusion     SURGICAL HISTORY: Past Surgical History:  Procedure Laterality Date  . CATARACT EXTRACTION Left 09/2015  . CHEST TUBE INSERTION Left 10/01/2018   Procedure: INSERTION PLEURAL DRAINAGE CATHETER;  Surgeon: [name], MD;  Location: [facility] ORS;  Service: Thoracic;  Laterality: Left;  . CORONARY ARTERY BYPASS GRAFT  2004   (CABG with LIMA to the  LAD, SVG to OM2/OM3, SVG  to diag  . DIALYSIS/PERMA CATHETER INSERTION N/A 03/14/2019   Procedure: DIALYSIS/PERMA CATHETER INSERTION;  Surgeon: Algernon Huxley, MD;  Location: [place] [facility];   Service: Cardiovascular;  Laterality: N/A;  . DIALYSIS/PERMA CATHETER REMOVAL N/A 05/28/2019   Procedure: DIALYSIS/PERMA CATHETER REMOVAL;  Surgeon: Katha Cabal, MD;  Location: [place] [facility];  Service: Cardiovascular;  Laterality: N/A;  . Left ankle surgery     repair of fracture  . Right lower leg surgery     rod  . TEMPORARY DIALYSIS CATHETER N/A 03/11/2019   Procedure: TEMPORARY DIALYSIS CATHETER;  Surgeon: Algernon Huxley, MD;  Location: [place] [facility];  Service: Cardiovascular;  Laterality: N/A;   SOCIAL HISTORY: Social History   Socioeconomic History  . Marital status: Married    Spouse name: Not on file  . Number of children: Not on file  . Years of education: Not on file  . Highest education level: Not on file  Occupational History  . Not on file  Tobacco Use  . Smoking status: Current Every Day Smoker    Packs/day: 0.20    Years: 45.00    Pack years: 9.00    Types: Cigarettes    Last attempt to quit: 03/05/2019    Years since quitting: 0.5  . Smokeless tobacco: Former Systems developer    Types: Snuff  Vaping Use  . Vaping Use: Never used  Substance and Sexual Activity  . Alcohol use: Not Currently    Alcohol/week: 6.0 standard drinks    Types: 6 Cans of beer per week    Comment: occ, average 6 pack in a week  . Drug use: No  . Sexual activity: Not Currently  Other Topics Concern  . Not on file  Social History Narrative   On disability 2009 after prev injuries and CAD.     Married 1976   2 kids, 4 grandkids.  Social Determinants of Health   Financial Resource Strain:   . Difficulty of Paying Living Expenses: Not on file  Food Insecurity:   . Worried About Charity fundraiser in the Last Year: Not on file  . Ran Out of Food in the Last Year: Not on file  Transportation Needs:   . Lack of Transportation (Medical): Not on file  . Lack of Transportation (Non-Medical): Not on file  Physical Activity:   . Days of Exercise per Week: Not on file  . Minutes  of Exercise per Session: Not on file  Stress:   . Feeling of Stress : Not on file  Social Connections:   . Frequency of Communication with Friends and Family: Not on file  . Frequency of Social Gatherings with Friends and Family: Not on file  . Attends Religious Services: Not on file  . Active Member of Clubs or Organizations: Not on file  . Attends Archivist Meetings: Not on file  . Marital Status: Not on file  Intimate Partner Violence:   . Fear of Current or Ex-Partner: Not on file  . Emotionally Abused: Not on file  . Physically Abused: Not on file  . Sexually Abused: Not on file     FAMILY HISTORY: Family History  Problem Relation Age of Onset  . Dementia Mother   . Heart disease Father   . Colon cancer Neg Hx   . Prostate cancer Neg Hx   . Diabetes Neg Hx    ALLERGIES:  Allergies  Allergen Reactions  . Promethazine     QT prolongation.   . Trazodone And Nefazodone     QT prolongation.   . Lipitor [Atorvastatin Calcium] Other (See Comments)    Aches.  Tolerated crestor.      MEDICATIONS: PHYSICAL EXAMINATION: Current Outpatient Medications on File Prior to Visit  Medication Sig Dispense Refill  . albuterol (VENTOLIN HFA) 108 (90 Base) MCG/ACT inhaler Inhale 2 puffs into the lungs every 6 (six) hours as needed for wheezing or shortness of breath. 18 g 0  . amiodarone (PACERONE) 200 MG tablet TAKE 1 TABLET BY MOUTH EVERY DAY 90 tablet 0  . apixaban (ELIQUIS) 5 MG TABS tablet Take 1 tablet (5 mg total) by mouth 2 (two) times daily. 60 tablet 5  . bismuth subsalicylate (PEPTO-BISMOL) 262 MG/15ML suspension Take 30 mLs by mouth every 6 (six) hours as needed for diarrhea or loose stools. 360 mL 0  . clopidogrel (PLAVIX) 75 MG tablet TAKE 1 TABLET BY MOUTH EVERY DAY 90 tablet 1  . DULoxetine (CYMBALTA) 30 MG capsule TAKE 1 CAPSULE BY MOUTH EVERY DAY 90 capsule 1  . ezetimibe (ZETIA) 10 MG tablet Take 1 tablet (10 mg total) by mouth daily. 30 tablet 5  .  ferrous sulfate 325 (65 FE) MG tablet TAKE 1 TABLET (325 MG TOTAL) BY MOUTH 2 (TWO) TIMES DAILY WITH A MEAL. 284 tablet 0  . folic acid (FOLVITE) 132 MCG tablet Take 400 mcg by mouth daily.    . furosemide (LASIX) 40 MG tablet If lightheaded or SBP <100, then take 43m lasix a day.  If not lightheaded and SBP >100, then take 1228mlasix a day. (Patient taking differently: If lightheaded or SBP <100, then take 8076masix a day.  If not lightheaded and SBP >100, then take 120m29msix a day.  09/25/19-taking 1 QD) 30 tablet   . insulin aspart (NOVOLOG) 100 UNIT/ML injection Inject 0-8 Units into the skin as directed. Take  0 units if CBG 70-150 Take 1 units if CBG 151-200 Take 2 units if CBG 201-250 Take 3 units if CBG 251-300 Take 4 units if CBG 301-350 Take 6 units if CBG 351-400 If CBG > 400, give 8 units and call MD 10 mL   . insulin detemir (LEVEMIR) 100 UNIT/ML injection Inject 0.2 mLs (20 Units total) into the skin at bedtime.    [name] ipratropium-albuterol (DUONEB) 0.5-2.5 (3) MG/3ML SOLN Take 3 mLs by nebulization 4 (four) times daily.    . metoprolol succinate (TOPROL-XL) 25 MG 24 hr tablet Take 0.5 tablets (12.5 mg total) by mouth daily. 45 tablet 1  . Multiple Vitamin (MULTIVITAMIN WITH MINERALS) TABS tablet Take 1 tablet by mouth at bedtime.    . multivitamin (RENA-VIT) TABS tablet Take 1 tablet by mouth at bedtime.  0  . potassium chloride (KLOR-CON) 10 MEQ tablet TAKE 1 TABLET BY MOUTH EVERY DAY 30 tablet 1  . predniSONE (DELTASONE) 50 MG tablet Take 25 mg by mouth daily.    . promethazine (PHENERGAN) 25 MG tablet Take 25 mg by mouth every 6 (six) hours as needed for nausea or vomiting.    . rosuvastatin (CRESTOR) 10 MG tablet TAKE 1 TABLET (10 MG TOTAL) BY MOUTH DAILY. 90 tablet 3   No current facility-administered medications on file prior to visit.    ECOG PERFORMANCE STATUS:3  Today's Vitals   09/25/19 1324  BP: 121/73  Pulse: 89  Resp: 20  Temp: (!) 96.3 F (35.7 C)    Weight: 207 lb 14.4 oz (94.3 kg)  PainSc: 0-No pain   Body mass index is 28.2 kg/m.  Physical Exam Constitutional:      General: He is not in acute distress.    Comments: Patient sits in the wheelchair.  HENT:     Head: Normocephalic and atraumatic.  Eyes:     General: No scleral icterus.    Pupils: Pupils are equal, round, and reactive to light.  Cardiovascular:     Rate and Rhythm: Normal rate and regular rhythm.     Heart sounds: Normal heart sounds.  Pulmonary:     Effort: Pulmonary effort is normal. No respiratory distress.     Breath sounds: No wheezing.     Comments: Bilateral Rales at the base. Decreased breath sound at the left compared to the right. Abdominal:     General: Bowel sounds are normal. There is no distension.     Palpations: Abdomen is soft. There is no mass.     Tenderness: There is no abdominal tenderness.  Musculoskeletal:        General: Swelling present. No deformity. Normal range of motion.     Cervical back: Normal range of motion and neck supple.  Skin:    General: Skin is warm and dry.     Findings: No erythema or rash.  Neurological:     Mental Status: He is alert. Mental status is at baseline.     Cranial Nerves: No cranial nerve deficit.     Coordination: Coordination normal.  Psychiatric:        Mood and Affect: Mood normal.     LABORATORY DATA:  I have reviewed the data as listed Lab Results  Component Value Date   WBC 18.9 (H) 11/09/2018   HGB 11.0 (L) 11/09/2018   HCT 33.5 (L) 11/09/2018   MCV 89.8 11/09/2018   PLT 581 (H) 11/09/2018   Recent Labs    10/19/18 0841 11/02/18 1238 11/09/18 7972  NA 137 132* 131*  K 4.0 4.0 3.5  CL 104 95* 94*  CO2 '24 22 26  ' GLUCOSE 252* 425* 361*  BUN '14 15 12  ' CREATININE 0.61 0.84 0.77  CALCIUM 8.5* 8.5* 8.0*  GFRNONAA >60 >60 >60  GFRAA >60 >60 >60  PROT 6.0* 7.0 6.5  ALBUMIN 3.0* 2.6* 2.3*  AST 14* 11* 12*  ALT '10 10 9  ' ALKPHOS 69 75 63  BILITOT 0.5 0.9 0.8    Iron/TIBC/Ferritin/ %Sat No results found for: IRON, TIBC, FERRITIN, IRONPCTSAT    RADIOGRAPHIC STUDIES: I have personally reviewed the radiological images as listed and agreed with the findings in the report. DG Chest 2 View  Result Date: 07/29/2019 CLINICAL DATA:  Cough EXAM: CHEST - 2 VIEW COMPARISON:  06/19/2019 FINDINGS: Prior CABG. Heart is normal size. Continued bilateral pleural effusions versus pleural thickening, left greater than right. Improved patchy opacity in the left mid and lower lung. Increasing density in the right mid lung and right base. Underlying chronic lung disease suspected. IMPRESSION: Stable bilateral pleural effusions or pleural thickening, left greater than right. Improving left mid lung and lower lung airspace disease with worsening right mid and lower lung airspace opacities. Findings concerning for pneumonia superimposed on chronic lung disease. Electronically Signed   By: Rolm Baptise M.D.   On: 07/29/2019 08:50   MR Brain Wo Contrast  Result Date: 07/12/2019 CLINICAL DATA:  Lung cancer staging EXAM: MRI HEAD WITHOUT CONTRAST TECHNIQUE: Multiplanar, multiecho pulse sequences of the brain and surrounding structures were obtained without intravenous contrast. COMPARISON:  03/05/2019 FINDINGS: Brain: No swelling or masslike finding disease. The patient has end-stage renal disease. Cerebral volume loss and mild periventricular and pontine chronic small vessel ischemia. No recent infarct, hydrocephalus, collection, or shift. Vascular: Normal flow voids Skull and upper cervical spine: No focal lesion is noted. Degenerative facet spurring. Sinuses/Orbits: Chronic right maxillary sinusitis with mucosal thickening and atelectasis. Left cataract resection. IMPRESSION: No evidence of metastatic disease. Electronically Signed   By: Monte Fantasia M.D.   On: 07/12/2019 09:59   NM PET Image Restag (PS) Skull Base To Thigh  Result Date: 07/18/2019 CLINICAL DATA:  Subsequent  treatment strategy for lung carcinoma. Immunotherapy ongoing. EXAM: NUCLEAR MEDICINE PET SKULL BASE TO THIGH TECHNIQUE: 10.9 mCi F-18 FDG was injected intravenously. Full-ring PET imaging was performed from the skull base to thigh after the radiotracer. CT data was obtained and used for attenuation correction and anatomic localization. Fasting blood glucose: 113 mg/dl COMPARISON:  PET-CT 09/18/2018 chest CT 03/14/2019. FINDINGS: Mediastinal blood pool activity: SUV max 2.38 Liver activity: SUV max NA NECK: No hypermetabolic lymph nodes in the neck. Incidental CT findings: none CHEST: No hypermetabolic mediastinal or hilar nodes. No suspicious pulmonary nodules on the CT scan. Incidental CT findings: Dense airspace disease in the right lower lobe. More diffuse airspace disease throughout the lungs. Moderate right effusion similar to prior. ABDOMEN/PELVIS: No abnormal hypermetabolic activity within the liver, pancreas, adrenal glands, or spleen. No hypermetabolic lymph nodes in the abdomen or pelvis. Incidental CT findings: none SKELETON: No focal hypermetabolic activity to suggest skeletal metastasis. Incidental CT findings: none IMPRESSION: 1.  No evidence of lung cancer recurrence or metastatic disease. 2. Dense airspace disease in the right lower lobe on the background of more diffuse airspace disease. Recommend clinical correlation for pneumonia or immunotherapy related pneumonitis These results will be called to the ordering clinician or representative by the Radiologist Assistant, and communication documented in the PACS or [employer]. Electronically  Signed   By: Suzy Bouchard M.D.   On: 07/18/2019 14:13   DG Shoulder Left  Result Date: 07/12/2019 CLINICAL DATA:  Left shoulder pain EXAM: LEFT SHOULDER - 2+ VIEW COMPARISON:  Chest radiograph dated 06/19/2019 FINDINGS: There is no evidence of fracture or dislocation. There is no evidence of arthropathy or other focal bone abnormality. Soft tissues are  unremarkable. Median sternotomy wires are noted. IMPRESSION: Negative. Electronically Signed   By: Zerita Boers M.D.   On: 07/12/2019 10:00     ASSESSMENT & PLAN:  1. Malignant neoplasm of unspecified part of unspecified bronchus or lung ([place])    #Stage IV metastatic lung adenocarcinoma TxNx M1 Omniseq showed [id number] mutation, results were discussed with patient and wife. BRAF targeted therapy was discontinued due to patient's recent acute illness including acute CHF, DKA, renal failure on hemodialysis.  Off hemodialysis. Patient has been on Keytruda maintenance. PET scan showed no evidence of lung cancer recurrence or metastatic disease. Hold treatment today.  See below.  #Transaminitis and alkaline phosphatase elevation, GGT is also elevated, indicating liver origin. Alkaline phosphatase has improved and transaminitis has worsened. ?  Immunotherapy side effects versus other liver condition. I will continue hold the treatment.  #CHF with worsening of bilateral lower extremity swelling. Recommend leg elevation.  Continue follow-up with cardiology. I discussed with patient and wife that his chronic heart problem is most likely be the competing cause. Given his multiple medical problems, prognosis is poor.  Last PET scan showed no cancer recurrence. I will hold off cancer treatment at this point and let him focus on treatment of other chronic medical problems. Wife and patient agree with the plan. Repeat CT scan at the end of October.  Patient declines COVID-19 vaccination, declines flu shot. Continue follow-up palliative care  Follow-up in 4-6 weeks   Earlie Server, MD, PhD Hematology Oncology [place] at [facility] 09/25/2019

## 2019-09-25 NOTE — Telephone Encounter (Signed)
Called to let Craig Koch pharmacist know that I was out when patient came in to be seen so paperwork was not done. She did email me page for provider to sign for the eliquis and scanned back to her. Called to also confirm receipt to make sure it was received. She was appreciative for the follow up and had no further needs at this time.

## 2019-09-25 NOTE — Telephone Encounter (Signed)
Spoke with Laretta Bolster at PCP office to let her know that paperwork was not completed at last visit due to me being out of the office. She sent me email with information and requested that I please scan document and send to her email once provider signs off. Provider signed form and scanned to her for patient.

## 2019-09-27 NOTE — Chronic Care Management (AMB) (Unsigned)
Chronic Care Management Pharmacy  Name: Craig Koch  MRN: [id number] DOB: Nov 26, 1955  Chief Complaint/ HPI  Craig Koch,  64 y.o. , male presents for their Initial CCM visit with the clinical pharmacist via telephone due to COVID-19 Pandemic.  PCP : Tonia Ghent, MD  Their chronic conditions include: hypertension, CAD, atrial fibrillation, NSTEMI, heart failure, Diabetes, hyperlipidemia, lower urinary tract symptoms,   Office Visits:  08/15/2019 - Trazodone 25-50 mg at night for sleep.   06/14/2019 - Increase furosemde 2 tablets daily until weight is down/swelling reduced.   05/14/2019 -Discussed tapering Levemir since his low sugars tend to happen in the early morning.  Goal AM sugar above 100.  Continue sliding scale insulin at baseline with meals.  He can update me about his sugar in the near future. Go to the lab on the way out for a chest xray. Amoxicillin and Doxycycline given for HCAP.   04/12/2019 - Discussed lowering his Levemir dose at nighttime and still continue with sliding scale insulin.  Goal to avoid low sugars.  Cut back on levemir by 1 unit a day until AM sugar ~120.  If you still have trouble with lows then let me know.  Use the inhaler before bed and see if that helps with the cough. Consult Visit:  09/25/2019 - Oncology - repeat CT at the end of October. Hold off on treatment and address other chronic problems.   09/17/2019 - Cardiology - defer titration of furosemide to nephrology.   09/13/2019 - Pulmonology - albuterol mdi, trelegy continued and Duoneb nebulizer initiated.   09/11/2019 - Oncology - no evidence of lung cancer recurrence or metastatic disease. Hold Keytruda due to increased alkaline phosphatase and tranaminitis. BRAF targeted therapy discontinued due to acute CHF, DKA, renal failure.   09/10/2019 - Cardiology - encouraged ace wraps for leg swelling and elevating legs while sitting for long periods of time.   09/06/2019 - cardiology  - decrease to furosemide 40 mg daily.   08/21/2019 - Oncology - infusion.   08/20/2019 - Palliative Care - no complaints or changes at this time.   07/31/2019 -  Furosemide 40 mg daily. Finish course of levaquin for exertional dyspnea.   07/30/2019 - Increase to lasix 64m qd for 3-5 days then drop back down to every other day furosemide 417m  Recheck BMET in 1 week. Loop in nephrology. Slow changes in position given h/o orthostatic hypotension. Discuss ongoing amiodarone with primary cardiologist (amiodarone lung considered as well). Smoking cessation advised as outlined below.   07/29/2019 - start levaquin for shortness of breath.   07/25/2019 -  Nephrology - . Chronic kidney disease stage IIIb. Most recent EGFR was found to be 39. Previously had acute kidney injury requiring dialysis but it appears that he is made a full recovery. However suspect that he has been left with some element of chronic kidney disease. Follow-up renal parameters today. Increase protein intake and continue furosemide qod.   07/10/2019 - oncology - infusion.   06/19/2019 - oncology infusion.   06/18/2019 - Palliative care. Coordinated nebulizer machine.  06/17/2019 - Nephrology. Doing well with dialysis. Furosemide 40 mg daily.   05/24/2019 - Cardiology - Lasix daily for now. Keflex after urine sample.   05/20/2019 - Nephrology - Acute kidney injury. Patient was started on dialysis in March 2021. We were able to successfully wean him off of dialysis. Most recent creatinine was 2.28 with an EGFR of 29.   05/20/2019 - Oncology - Is  currently off hemodialysis.  Creatinine may reflect his true kidney function without dialysis.  I advised patient to clarify with nephrology if any protein limitations.His nutrition status is low.  Albumin 2.7.  If no protein restriction per nephrology, recommend patient to take nutrition supplements.  Continue follow-up with nutritionist.Anemia, likely secondary to CKD.  Monitor  hemoglobin.  Erythropoietin is contraindicated due to active cancer.  Check iron panel at the next visit. Pneumonia,, currently on oral antibiotic treatments. Chest x-ray was independently reviewed and discussed patient. 05/10/2019 - Cardiology - Afib - amiodarone 200 mg daily. Restart Eliquis 5 mg bid. With ongoing or symptomatic hypotension, may need to consider decreasing or holding his BB to prevent prerenal AKI. Continue BB as BP allows, as well as rosuvastatin and newly started Zetia for risk factor modification.  05/08/2019 - Oncology - immunotherapy.  05/01/2019 - Oncology - Omniseq showed [id number] mutation, results were discussed with patient and wife.BRAF targeted therapy was discontinued due to patient's recent acute illness including acute CHF, DKA, renal failure on hemodialysis.   04/24/2019 - Nutrition consult for weight loss. Encouraged patient to consume adequate calories/protein.   04/18/2019 - palliative home care visit.   04/17/2019 - oncology - encounter for immunotherapy.   04/08/2019 - oncology - stage IV metastatic lung adenocarcinoma. Patient wants to try immunotherapy.   04/05/2019 - Cardiology - NYHA class III. Unable to add entresto or titrate up metoprolol succinate due to low bp. Palliative care involved.   03/26/2019 - ED to Hospital Admission for SOB.   03/03/2019 - ED to Hospital admission for DKA. Medications: Outpatient Encounter Medications as of 09/30/2019  Medication Sig  . albuterol (VENTOLIN HFA) 108 (90 Base) MCG/ACT inhaler Inhale 2 puffs into the lungs every 6 (six) hours as needed for wheezing or shortness of breath.  [name] amiodarone (PACERONE) 200 MG tablet TAKE 1 TABLET BY MOUTH EVERY DAY  . apixaban (ELIQUIS) 5 MG TABS tablet Take 1 tablet (5 mg total) by mouth 2 (two) times daily.  [name] bismuth subsalicylate (PEPTO-BISMOL) 262 MG/15ML suspension Take 30 mLs by mouth every 6 (six) hours as needed for diarrhea or loose stools.  . clopidogrel (PLAVIX)  75 MG tablet TAKE 1 TABLET BY MOUTH EVERY DAY  . DULoxetine (CYMBALTA) 30 MG capsule TAKE 1 CAPSULE BY MOUTH EVERY DAY  . ezetimibe (ZETIA) 10 MG tablet Take 1 tablet (10 mg total) by mouth daily.  . ferrous sulfate 325 (65 FE) MG tablet TAKE 1 TABLET (325 MG TOTAL) BY MOUTH 2 (TWO) TIMES DAILY WITH A MEAL.  . folic acid (FOLVITE) 098 MCG tablet Take 400 mcg by mouth daily.  . furosemide (LASIX) 40 MG tablet If lightheaded or SBP <100, then take $RemoveB'80mg'MncUVaTD$  lasix a day.  If not lightheaded and SBP >100, then take $RemoveB'120mg'OUtuaPtb$  lasix a day. (Patient taking differently: If lightheaded or SBP <100, then take $RemoveB'80mg'ZScJzXFh$  lasix a day.  If not lightheaded and SBP >100, then take $RemoveB'120mg'TydlUQRd$  lasix a day.  09/25/19-taking 1 QD)  . insulin aspart (NOVOLOG) 100 UNIT/ML injection Inject 0-8 Units into the skin as directed. Take 0 units if CBG 70-150 Take 1 units if CBG 151-200 Take 2 units if CBG 201-250 Take 3 units if CBG 251-300 Take 4 units if CBG 301-350 Take 6 units if CBG 351-400 If CBG > 400, give 8 units and call MD  . insulin detemir (LEVEMIR) 100 UNIT/ML injection Inject 0.2 mLs (20 Units total) into the skin at bedtime.  [name] ipratropium-albuterol (DUONEB) 0.5-2.5 (3) MG/3ML  SOLN Take 3 mLs by nebulization 4 (four) times daily.  . metoprolol succinate (TOPROL-XL) 25 MG 24 hr tablet Take 0.5 tablets (12.5 mg total) by mouth daily.  . Multiple Vitamin (MULTIVITAMIN WITH MINERALS) TABS tablet Take 1 tablet by mouth at bedtime.  . multivitamin (RENA-VIT) TABS tablet Take 1 tablet by mouth at bedtime.  . potassium chloride (KLOR-CON) 10 MEQ tablet TAKE 1 TABLET BY MOUTH EVERY DAY  . predniSONE (DELTASONE) 50 MG tablet Take 25 mg by mouth daily.  . promethazine (PHENERGAN) 25 MG tablet Take 25 mg by mouth every 6 (six) hours as needed for nausea or vomiting.  . rosuvastatin (CRESTOR) 10 MG tablet TAKE 1 TABLET (10 MG TOTAL) BY MOUTH DAILY.   No facility-administered encounter medications on file as of 09/30/2019.      Current Diagnosis/Assessment:  Goals Addressed   None     Diabetes   Recent Relevant Labs: Lab Results  Component Value Date/Time   HGBA1C 7.2 (A) 05/14/2019 08:54 AM   HGBA1C 8.8 (H) 03/26/2019 09:55 PM   HGBA1C 11.3 (H) 03/04/2019 10:59 AM   MICROALBUR 9.9 (H) 02/17/2015 03:25 PM     Checking BG: 3x per Day before meals and at bedtime.   Recent FBG Readings: 180 mg/dL  Recent pre-meal BG readings: 130-170 mg/dL Recent HS BG readings: 200 mg/dL Patient has failed these meds in past: byetta, glipizide, metformin Patient is currently uncontrolled on the following medications:   Novolog 0-8 units sliding scale before meals   Levemir 100 units/ml 20 units at bedtime  Last diabetic Foot exam:  Lab Results  Component Value Date/Time   HMDIABEYEEXA Retinopathy (A) 09/06/2017 12:00 AM    Last diabetic Eye exam: No results found for: HMDIABFOOTEX   We discussed: diet and exercise extensively. Patient encouraged to get calorie/protein per oncology. He is eating a variety of foods trying to be mindful of protein. Patient's insulins are expensive on top of all other medical expenses. Pharmacist working with Dr. Damita Dunnings to get Novolog and Levemir approved via patient assistance.   Plan  Continue current medications and work to get [employer] or Levermir approved via patient assistance.   AFIB   Patient is currently rhythm controlled. HR 82 BPM  Patient has failed these meds in past: n/a Patient is currently controlled on the following medications:   Eliquis 5 mg bid   Amiodarone 200 mg daily  We discussed:  Patient's Eliquis is cost prohibitive at this time. Pharmacist coordinating with cardiology to work on PAP application and to check for samples. Patient advised to bring proof of income and proof of medication expenses to the cardiology visit 08/06/2019.   Plan  Continue current medications    Medication Management   Pt uses [facility] for all  medications Uses pill box? Yes Pt endorses good compliance - Patient's fill history does not have gaps outside of hospitalizations.   We discussed: Patient's wife picks up his medications and prepares it each day for him. Patient is not driving at this time due to his health.   Plan  Continue current medication management strategy. Immediate needs addressed during today's visit to ensure patient has medication needed. Patient's Eliquis and Insulins are expensive on top of all other medical costs.     Follow up: 1 month phone visit to address disease states other than Diabetes and Afib.

## 2019-09-30 ENCOUNTER — Ambulatory Visit: Payer: Medicare Other

## 2019-10-04 ENCOUNTER — Encounter: Payer: Self-pay | Admitting: Family Medicine

## 2019-10-04 ENCOUNTER — Other Ambulatory Visit: Payer: Self-pay

## 2019-10-04 ENCOUNTER — Ambulatory Visit (INDEPENDENT_AMBULATORY_CARE_PROVIDER_SITE_OTHER): Payer: Medicare Other | Admitting: Family Medicine

## 2019-10-04 ENCOUNTER — Emergency Department
Admission: EM | Admit: 2019-10-04 | Discharge: 2019-10-04 | Disposition: A | Discharge status: Home / Self Care | Payer: Medicare Other | Attending: Emergency Medicine | Admitting: Emergency Medicine

## 2019-10-04 VITALS — BP 132/72 | HR 92 | Temp 97.6°F | Ht 72.0 in | Wt 217.1 lb

## 2019-10-04 DIAGNOSIS — Z794 Long term (current) use of insulin: Secondary | ICD-10-CM | POA: Diagnosis not present

## 2019-10-04 DIAGNOSIS — N186 End stage renal disease: Secondary | ICD-10-CM | POA: Diagnosis not present

## 2019-10-04 DIAGNOSIS — Z992 Dependence on renal dialysis: Secondary | ICD-10-CM | POA: Diagnosis not present

## 2019-10-04 DIAGNOSIS — R748 Abnormal levels of other serum enzymes: Secondary | ICD-10-CM

## 2019-10-04 DIAGNOSIS — Z79899 Other long term (current) drug therapy: Secondary | ICD-10-CM | POA: Insufficient documentation

## 2019-10-04 DIAGNOSIS — I4891 Unspecified atrial fibrillation: Secondary | ICD-10-CM | POA: Diagnosis not present

## 2019-10-04 DIAGNOSIS — Z7901 Long term (current) use of anticoagulants: Secondary | ICD-10-CM | POA: Insufficient documentation

## 2019-10-04 DIAGNOSIS — I2510 Atherosclerotic heart disease of native coronary artery without angina pectoris: Secondary | ICD-10-CM | POA: Diagnosis not present

## 2019-10-04 DIAGNOSIS — E43 Unspecified severe protein-calorie malnutrition: Secondary | ICD-10-CM | POA: Diagnosis not present

## 2019-10-04 DIAGNOSIS — R040 Epistaxis: Secondary | ICD-10-CM

## 2019-10-04 DIAGNOSIS — I132 Hypertensive heart and chronic kidney disease with heart failure and with stage 5 chronic kidney disease, or end stage renal disease: Secondary | ICD-10-CM | POA: Diagnosis not present

## 2019-10-04 DIAGNOSIS — Z951 Presence of aortocoronary bypass graft: Secondary | ICD-10-CM | POA: Insufficient documentation

## 2019-10-04 DIAGNOSIS — E1165 Type 2 diabetes mellitus with hyperglycemia: Secondary | ICD-10-CM | POA: Insufficient documentation

## 2019-10-04 DIAGNOSIS — F17210 Nicotine dependence, cigarettes, uncomplicated: Secondary | ICD-10-CM | POA: Insufficient documentation

## 2019-10-04 DIAGNOSIS — C3490 Malignant neoplasm of unspecified part of unspecified bronchus or lung: Secondary | ICD-10-CM | POA: Diagnosis not present

## 2019-10-04 DIAGNOSIS — R600 Localized edema: Secondary | ICD-10-CM | POA: Diagnosis not present

## 2019-10-04 DIAGNOSIS — I509 Heart failure, unspecified: Secondary | ICD-10-CM | POA: Diagnosis not present

## 2019-10-04 LAB — COMPREHENSIVE METABOLIC PANEL
ALT: 65 U/L — ABNORMAL HIGH (ref 0–44)
AST: 58 U/L — ABNORMAL HIGH (ref 15–41)
Albumin: 2.7 g/dL — ABNORMAL LOW (ref 3.5–5.0)
Alkaline Phosphatase: 240 U/L — ABNORMAL HIGH (ref 38–126)
Anion gap: 14 (ref 5–15)
BUN: 58 mg/dL — ABNORMAL HIGH (ref 8–23)
CO2: 22 mmol/L (ref 22–32)
Calcium: 8.1 mg/dL — ABNORMAL LOW (ref 8.9–10.3)
Chloride: 101 mmol/L (ref 98–111)
Creatinine, Ser: 1.85 mg/dL — ABNORMAL HIGH (ref 0.61–1.24)
GFR calc Af Amer: 44 mL/min — ABNORMAL LOW (ref >60)
GFR calc non Af Amer: 38 mL/min — ABNORMAL LOW (ref >60)
Glucose, Bld: 455 mg/dL — ABNORMAL HIGH (ref 70–99)
Potassium: 4.2 mmol/L (ref 3.5–5.1)
Sodium: 137 mmol/L (ref 135–145)
Total Bilirubin: 1.7 mg/dL — ABNORMAL HIGH (ref 0.3–1.2)
Total Protein: 6.3 g/dL — ABNORMAL LOW (ref 6.5–8.1)

## 2019-10-04 LAB — CBC WITH DIFFERENTIAL/PLATELET
Abs Immature Granulocytes: 0.09 10*3/uL — ABNORMAL HIGH (ref 0.00–0.07)
Basophils Absolute: 0 10*3/uL (ref 0.0–0.1)
Basophils Relative: 0 %
Eosinophils Absolute: 0 10*3/uL (ref 0.0–0.5)
Eosinophils Relative: 0 %
HCT: 33 % — ABNORMAL LOW (ref 39.0–52.0)
Hemoglobin: 10.5 g/dL — ABNORMAL LOW (ref 13.0–17.0)
Immature Granulocytes: 1 %
Lymphocytes Relative: 7 %
Lymphs Abs: 0.6 10*3/uL — ABNORMAL LOW (ref 0.7–4.0)
MCH: 30.5 pg (ref 26.0–34.0)
MCHC: 31.8 g/dL (ref 30.0–36.0)
MCV: 95.9 fL (ref 80.0–100.0)
Monocytes Absolute: 0.8 10*3/uL (ref 0.1–1.0)
Monocytes Relative: 9 %
Neutro Abs: 7.2 10*3/uL (ref 1.7–7.7)
Neutrophils Relative %: 83 %
Platelets: 199 10*3/uL (ref 150–400)
RBC: 3.44 MIL/uL — ABNORMAL LOW (ref 4.22–5.81)
RDW: 23.2 % — ABNORMAL HIGH (ref 11.5–15.5)
WBC: 8.7 10*3/uL (ref 4.0–10.5)
nRBC: 0 % (ref 0.0–0.2)

## 2019-10-04 LAB — GLUCOSE, CAPILLARY: Glucose-Capillary: 404 mg/dL — ABNORMAL HIGH (ref 70–99)

## 2019-10-04 MED ORDER — OXYMETAZOLINE HCL 0.05 % NA SOLN
1.0000 | Freq: Once | NASAL | Status: AC
  Administered 2019-10-04: 1 via NASAL
  Filled 2019-10-04: qty 30

## 2019-10-04 MED ORDER — INSULIN ASPART 100 UNIT/ML ~~LOC~~ SOLN
10.0000 [IU] | Freq: Once | SUBCUTANEOUS | Status: AC
  Administered 2019-10-04: 10 [IU] via SUBCUTANEOUS
  Filled 2019-10-04: qty 1

## 2019-10-04 MED ORDER — CEPHALEXIN 500 MG PO CAPS: 500.0000 mg | ORAL_CAPSULE | Freq: Three times a day (TID) | ORAL | 0 refills | Status: AC

## 2019-10-04 MED ORDER — INSULIN ASPART 100 UNIT/ML ~~LOC~~ SOLN: 10.0000 [IU] | Freq: Once | SUBCUTANEOUS | Status: DC

## 2019-10-04 NOTE — Assessment & Plan Note (Addendum)
Persistent R nare bleed despite gauze and nasal pinching for 1 hour in office - advised go to [facility] today for further eval/treatment - as we [facility]'t have any ability to do nasal tamponade or afrin or other intervention available in the office.  I did ask him to hold eliquis over the weekend, reviewing small increased stroke risk.

## 2019-10-04 NOTE — Assessment & Plan Note (Signed)
Persistent.  Will increase lasix to 40mg  BID over weekend, check labs again today.  Anticipate multifactorial - CHF, renal disease, liver disease, low albumin all contributing.  Encouraged renewed efforts at nutrition with protein with each meal, add protein supplement if unable to reach this goal.

## 2019-10-04 NOTE — Patient Instructions (Addendum)
Labs today  Increase lasix to 40mg  twice daily over the weekend.  Hold eliquis over the weekend.  Due to ongoing nosebleed despite above, go to urgent care for further evaluation.

## 2019-10-04 NOTE — Assessment & Plan Note (Signed)
Sees onc on Keytruda maintenance. Upcoming pan CT.

## 2019-10-04 NOTE — ED Triage Notes (Signed)
Pt with right sided nare bleeding since this am. Pt with nasal clamp in place with no obvious bleeding in triage. Is on blood thinners.

## 2019-10-04 NOTE — Discharge Instructions (Signed)
Take Keflex 3 times daily for the next 7 days. Please stop Eliquis and Plavix per Dr. Tami Ribas until you see him. Dr. Tami Ribas wants you to be seen within the next 5 days.  Please call phone number to make an appointment.

## 2019-10-04 NOTE — ED Provider Notes (Signed)
Emergency Department Provider Note  ____________________________________________  Time seen: Approximately 8:11 PM  I have reviewed the triage vital signs and the nursing notes.   HISTORY  Chief Complaint Epistaxis   Historian Patient     HPI Craig Koch is a 64 y.o. male presents to the emergency department with epistaxis from the right nare that started at 9:00 this morning.  Patient has been trying nasal clipping at home without improvement.  He denies chest pain, chest tightness or abdominal pain.  He is taking both Eliquis and Plavix.   Past Medical History:  Diagnosis Date  . Anxiety   . Arthritis   . CHF (congestive heart failure) ([place])   . Coronary artery disease   . Diabetes mellitus   . Dyslipidemia   . Hx of CABG   . Hyperlipidemia   . Hypertension   . Malignant neoplasm of unspecified part of unspecified bronchus or lung ([place]) 08/2018   Immunotherapy  . Pleural effusion      Immunizations up to date:  Yes.     Past Medical History:  Diagnosis Date  . Anxiety   . Arthritis   . CHF (congestive heart failure) ([place])   . Coronary artery disease   . Diabetes mellitus   . Dyslipidemia   . Hx of CABG   . Hyperlipidemia   . Hypertension   . Malignant neoplasm of unspecified part of unspecified bronchus or lung ([place]) 08/2018   Immunotherapy  . Pleural effusion     Patient Active Problem List   Diagnosis Date Noted  . Epistaxis 10/04/2019  . Stage 4 chronic kidney disease ([place]) 09/25/2019  . Alkaline phosphatase elevation 09/25/2019  . Confusion 09/25/2019  . Hallucination 08/19/2019  . Encounter for antineoplastic immunotherapy 04/17/2019  . Fatigue 04/17/2019  . Weight loss 04/17/2019  . HCAP (healthcare-associated pneumonia) 03/26/2019  . Hypoxemia 03/26/2019  . ESRD on hemodialysis ([place]) 03/26/2019  . Hx of CABG 03/26/2019  . AF (paroxysmal atrial fibrillation) ([place]) 03/26/2019  . Acute on chronic combined systolic (congestive) and  diastolic (congestive) heart failure ([place]) 03/26/2019  . S/P thoracentesis   . Pleural effusion   . Acute systolic heart failure ([place])   . Pain of right upper extremity   . Microcytic anemia   . Protein-calorie malnutrition, severe 03/06/2019  . Acute alteration in mental status   . Palliative care encounter   . Atrial fibrillation with RVR ([place]) 03/03/2019  . DKA (diabetic ketoacidoses) 03/03/2019  . Hypothermia 03/03/2019  . SOB (shortness of breath) 03/03/2019  . Acute renal failure ([place]) 03/03/2019  . NSTEMI (non-ST elevated myocardial infarction) ([place]) 03/03/2019  . Hyponatremia 02/08/2019  . Uncontrolled type 2 diabetes mellitus with hyperglycemia ([place]) 02/08/2019  . Pedal edema 02/08/2019  . Encounter for antineoplastic chemotherapy 12/08/2018  . Tobacco abuse 10/12/2018  . Administrative encounter 09/23/2018  . Malignant neoplasm of unspecified part of unspecified bronchus or lung ([place]) 09/08/2018  . Goals of care, counseling/discussion 09/08/2018  . Recurrent pleural effusion on left 09/02/2018  . Elevated troponin 08/24/2018  . Medicare annual wellness visit, subsequent 08/07/2018  . Lower urinary tract symptoms (LUTS) 08/07/2018  . Neck pain 08/07/2018  . CAD (coronary artery disease), native coronary artery 09/23/2017  . Smoker 09/23/2017  . Chronic joint pain 10/18/2016  . Healthcare maintenance 07/17/2016  . Advance care planning 07/17/2016  . Fungal rash of torso 05/06/2016  . Trigger finger 05/06/2016  . History of alcohol use 11/17/2015  . Skin tear of forearm without complication  05/19/2015  . Anxiety state 02/19/2015  . Hand weakness 05/23/2011  . Diabetes mellitus with complication ([place]) 18/29/9371  . HLD (hyperlipidemia) 03/05/2010  . Essential hypertension 03/05/2010  . Coronary atherosclerosis 03/05/2010    Past Surgical History:  Procedure Laterality Date  . CATARACT EXTRACTION Left 09/2015  . CHEST TUBE INSERTION Left 10/01/2018   Procedure:  INSERTION PLEURAL DRAINAGE CATHETER;  Surgeon: [name], MD;  Location: [facility] ORS;  Service: Thoracic;  Laterality: Left;  . CORONARY ARTERY BYPASS GRAFT  2004   (CABG with LIMA to the  LAD, SVG to OM2/OM3, SVG  to diag  . DIALYSIS/PERMA CATHETER INSERTION N/A 03/14/2019   Procedure: DIALYSIS/PERMA CATHETER INSERTION;  Surgeon: Algernon Huxley, MD;  Location: [place];  Service: Cardiovascular;  Laterality: N/A;  . DIALYSIS/PERMA CATHETER REMOVAL N/A 05/28/2019   Procedure: DIALYSIS/PERMA CATHETER REMOVAL;  Surgeon: Katha Cabal, MD;  Location: [facility];  Service: Cardiovascular;  Laterality: N/A;  . Left ankle surgery     repair of fracture  . Right lower leg surgery     rod  . TEMPORARY DIALYSIS CATHETER N/A 03/11/2019   Procedure: TEMPORARY DIALYSIS CATHETER;  Surgeon: Algernon Huxley, MD;  Location: [facility];  Service: Cardiovascular;  Laterality: N/A;    Prior to Admission medications   Medication Sig Start Date End Date Taking? Authorizing Provider  albuterol (VENTOLIN HFA) 108 (90 Base) MCG/ACT inhaler Inhale 2 puffs into the lungs every 6 (six) hours as needed for wheezing or shortness of breath. 04/01/19 10/04/19  Wyvonnia Dusky, MD  amiodarone (PACERONE) 200 MG tablet TAKE 1 TABLET BY MOUTH EVERY DAY 07/16/19   Tonia Ghent, MD  apixaban (ELIQUIS) 5 MG TABS tablet Take 1 tablet (5 mg total) by mouth 2 (two) times daily. 05/10/19   Marrianne Mood D, PA-C  bismuth subsalicylate (PEPTO-BISMOL) 262 MG/15ML suspension Take 30 mLs by mouth every 6 (six) hours as needed for diarrhea or loose stools. 08/30/19   Tonia Ghent, MD  cephALEXin (KEFLEX) 500 MG capsule Take 1 capsule (500 mg total) by mouth 3 (three) times daily for 7 days. 10/04/19 10/11/19  Lannie Fields, PA-C  clopidogrel (PLAVIX) 75 MG tablet TAKE 1 TABLET BY MOUTH EVERY DAY 07/16/19   Tonia Ghent, MD  DULoxetine (CYMBALTA) 30 MG capsule TAKE 1 CAPSULE BY MOUTH EVERY DAY  09/16/19   Tonia Ghent, MD  ezetimibe (ZETIA) 10 MG tablet Take 1 tablet (10 mg total) by mouth daily. 05/10/19 09/16/28  Marrianne Mood D, PA-C  ferrous sulfate 325 (65 FE) MG tablet TAKE 1 TABLET (325 MG TOTAL) BY MOUTH 2 (TWO) TIMES DAILY WITH A MEAL. 07/16/19 09/16/28  Tonia Ghent, MD  folic acid (FOLVITE) 696 MCG tablet Take 400 mcg by mouth daily.    [provider]  furosemide (LASIX) 40 MG tablet If lightheaded or SBP <100, then take 80mg  lasix a day.  If not lightheaded and SBP >100, then take 120mg  lasix a day. Patient taking differently: If lightheaded or SBP <100, then take 80mg  lasix a day.  If not lightheaded and SBP >100, then take 120mg  lasix a day.  09/25/19-taking 1 QD 08/26/19   Tonia Ghent, MD  insulin aspart (NOVOLOG) 100 UNIT/ML injection Inject 0-8 Units into the skin as directed. Take 0 units if CBG 70-150 Take 1 units if CBG 151-200 Take 2 units if CBG 201-250 Take 3 units if CBG 251-300 Take 4 units if CBG  301-350 Take 6 units if CBG 351-400 If CBG > 400, give 8 units and call MD 04/15/19   Tonia Ghent, MD  insulin detemir (LEVEMIR) 100 UNIT/ML injection Inject 0.2 mLs (20 Units total) into the skin at bedtime. 06/14/19   Tonia Ghent, MD  ipratropium-albuterol (DUONEB) 0.5-2.5 (3) MG/3ML SOLN Take 3 mLs by nebulization 4 (four) times daily. 06/12/19   [provider]  metoprolol succinate (TOPROL-XL) 25 MG 24 hr tablet Take 0.5 tablets (12.5 mg total) by mouth daily. 04/15/19   Tonia Ghent, MD  Multiple Vitamin (MULTIVITAMIN WITH MINERALS) TABS tablet Take 1 tablet by mouth at bedtime.    [provider]  multivitamin (RENA-VIT) TABS tablet Take 1 tablet by mouth at bedtime. 03/19/19   Ezekiel Slocumb, DO  potassium chloride (KLOR-CON) 10 MEQ tablet TAKE 1 TABLET BY MOUTH EVERY DAY 09/17/19   Tonia Ghent, MD  promethazine (PHENERGAN) 25 MG tablet Take 25 mg by mouth every 6 (six) hours as needed for nausea or  vomiting.    [provider]  rosuvastatin (CRESTOR) 10 MG tablet TAKE 1 TABLET (10 MG TOTAL) BY MOUTH DAILY. 01/07/19   Tonia Ghent, MD    Allergies Promethazine, Trazodone and nefazodone, and Lipitor [atorvastatin calcium]  Family History  Problem Relation Age of Onset  . Dementia Mother   . Heart disease Father   . Colon cancer Neg Hx   . Prostate cancer Neg Hx   . Diabetes Neg Hx     Social History Social History   Tobacco Use  . Smoking status: Current Every Day Smoker    Packs/day: 0.20    Years: 45.00    Pack years: 9.00    Types: Cigarettes    Last attempt to quit: 03/05/2019    Years since quitting: 0.5  . Smokeless tobacco: Former Systems developer    Types: Snuff  Vaping Use  . Vaping Use: Never used  Substance Use Topics  . Alcohol use: Not Currently    Alcohol/week: 6.0 standard drinks    Types: 6 Cans of beer per week    Comment: occ, average 6 pack in a week  . Drug use: No     Review of Systems  Constitutional: No fever/chills Eyes:  No discharge ENT: Patient has epistaxis.  Respiratory: no cough. No SOB/ use of accessory muscles to breath Gastrointestinal:   No nausea, no vomiting.  No diarrhea.  No constipation. Musculoskeletal: Negative for musculoskeletal pain. Skin: Negative for rash, abrasions, lacerations, ecchymosis.  ____________________________________________   PHYSICAL EXAM:  VITAL SIGNS: ED Triage Vitals [10/04/19 1909]  Enc Vitals Group     BP 122/63     Pulse Rate 90     Resp 16     Temp (!) 97.4 F (36.3 C)     Temp Source Axillary     SpO2 100 %     Weight 210 lb (95.3 kg)     Height 6' (1.829 m)     Head Circumference      Peak Flow      Pain Score 0     Pain Loc      Pain Edu?      Excl. in [place]?      Constitutional: Alert and oriented. Well appearing and in no acute distress. Eyes: Conjunctivae are normal. PERRL. EOMI. Head: Atraumatic. ENT:      Ears: TMs are pearly.       Nose: No congestion/rhinnorhea.   No active  bleeding at this time.  Blood visualized in right nare.      Mouth/Throat: Mucous membranes are moist.  Neck: No stridor.  No cervical spine tenderness to palpation.  Cardiovascular: Normal rate, regular rhythm. Normal S1 and S2.  Good peripheral circulation. Respiratory: Normal respiratory effort without tachypnea or retractions. Lungs CTAB. Good air entry to the bases with no decreased or absent breath sounds Gastrointestinal: Bowel sounds x 4 quadrants. Soft and nontender to palpation. No guarding or rigidity. No distention. Musculoskeletal: Full range of motion to all extremities. No obvious deformities noted Neurologic:  Normal for age. No gross focal neurologic deficits are appreciated.  Skin:  Skin is warm, dry and intact. No rash noted. Psychiatric: Mood and affect are normal for age. Speech and behavior are normal.   ____________________________________________   LABS (all labs ordered are listed, but only abnormal results are displayed)  Labs Reviewed  CBC WITH DIFFERENTIAL/PLATELET - Abnormal; Notable for the following components:      Result Value   RBC 3.44 (*)    Hemoglobin 10.5 (*)    HCT 33.0 (*)    RDW 23.2 (*)    Lymphs Abs 0.6 (*)    Abs Immature Granulocytes 0.09 (*)    All other components within normal limits  COMPREHENSIVE METABOLIC PANEL - Abnormal; Notable for the following components:   Glucose, Bld 455 (*)    BUN 58 (*)    Creatinine, Ser 1.85 (*)    Calcium 8.1 (*)    Total Protein 6.3 (*)    Albumin 2.7 (*)    AST 58 (*)    ALT 65 (*)    Alkaline Phosphatase 240 (*)    Total Bilirubin 1.7 (*)    GFR calc non Af Amer 38 (*)    GFR calc Af Amer 44 (*)    All other components within normal limits  GLUCOSE, CAPILLARY - Abnormal; Notable for the following components:   Glucose-Capillary 404 (*)    All other components within normal limits    ____________________________________________  EKG   ____________________________________________  RADIOLOGY   No results found.  ____________________________________________    PROCEDURES  Procedure(s) performed:     Procedures     Medications  oxymetazoline (AFRIN) 0.05 % nasal spray 1 spray (1 spray Each Nare Given 10/04/19 2017)  insulin aspart (novoLOG) injection 10 Units (10 Units Subcutaneous Given 10/04/19 2126)     ____________________________________________   INITIAL IMPRESSION / ASSESSMENT AND PLAN / ED COURSE  Pertinent labs & imaging results that were available during my care of the patient were reviewed by me and considered in my medical decision making (see chart for details).  Clinical Course as of Oct 04 2310  Fri Oct 04, 2019  2022 Eosinophils Absolute: PENDING [JW]    Clinical Course User Index [JW] Lannie Fields, PA-C     Assessment and plan:  Epistaxis:  64 year old male presents to the emergency department with epistaxis of the right nare.  Vital signs are reassuring at triage. On physical exam, patient had bleeding from right nare that persisted after Afrin and nasal clipping.  Patient right nare was packed using Merocel.  I contacted Dr. Tami Ribas, otolaryngologist on call who will recommended patient stopping his Eliquis and Plavix until he was seen in the office.  Patient was discharged with Keflex to be taken 3 times daily for the next 7 days. I recommended that patient seek care with otolaryngology within the next 5 days.  CBC was consistent with  patient's baseline. CMP was also consistent with patient's baseline aside from hyperglycemia. Patient was given insulin in the emergency department.  Return precautions were given to return to the emergency department with new or worsening symptoms.  ____________________________________________  FINAL CLINICAL IMPRESSION(S) / ED DIAGNOSES  Final diagnoses:  Epistaxis       NEW MEDICATIONS STARTED DURING THIS VISIT:  ED Discharge Orders         Ordered    cephALEXin (KEFLEX) 500 MG capsule  3 times daily        10/04/19 2147              This chart was dictated using voice recognition software/Dragon. Despite best efforts to proofread, errors can occur which can change the meaning. Any change was purely unintentional.     Lannie Fields, PA-C 10/04/19 2312    Lucrezia Starch, MD 10/05/19 [phone]

## 2019-10-04 NOTE — Progress Notes (Signed)
This visit was conducted in person.  BP 132/72 (BP Location: Right Arm, Patient Position: Sitting, Cuff Size: Normal)   Pulse 92   Temp 97.6 F (36.4 C) (Temporal)   Ht 6' (1.829 m)   Wt 217 lb 2 oz (98.5 kg)   SpO2 97%   BMI 29.45 kg/m    CC: leg swelling, nose bleed Subjective:    Patient ID: Craig Koch, male    DOB: 1955/12/05, 64 y.o.   MRN: [id number]  HPI: Craig Koch is a 64 y.o. male presenting on 10/04/2019 for Foot Swelling (C/o bilateral feet and calf swelling.  Worse in left leg.  Started months ago.  Cards seems to think heart is the cause. Pt accomapanied by wife, [name]- temp  97.9.) and Epistaxis (C/o nosebleed since this morning. H/o nosebleeds. )   Known metastatic lung cancer regularly sees onc on Keytruda maintenance, diabetic on insulin, liver dysfunction (chronic ALP elevation, relatively new transaminitis), CKD, HFrEF, CAD s/p 4v CABG on plavix, afib with RVR on eliquis. No h/o stroke or blood clots. H/o COPD s/p recent prednisone course - completed last week.   For months notes worsening pedal edema L>R despite lasix 40mg  daily. Chronic dyspnea with exertion - unchanged. Occasional orthostatic dizziness, not worse today.  Denies chest pain, tightness, palpitations, or cough. No fever.  Recently treated for COPD exacerbation with prednisone and antibiotic course.  Saw cardiology last week - advise to limit liquids to 64 oz.  Also sees nephrologist regularly Holley Raring)  Nosebleed started this morning at 8am, has not stopped. Initial R side then L sided as well. No other bleeding noted - in stool, urine, etc.  AC - takes both plavix and eliquis.   Lasix 40mg  does produce good UOP.      Relevant past medical, surgical, family and social history reviewed and updated as indicated. Interim medical history since our last visit reviewed. Allergies and medications reviewed and updated. Outpatient Medications Prior to Visit  Medication Sig Dispense Refill    . albuterol (VENTOLIN HFA) 108 (90 Base) MCG/ACT inhaler Inhale 2 puffs into the lungs every 6 (six) hours as needed for wheezing or shortness of breath. 18 g 0  . amiodarone (PACERONE) 200 MG tablet TAKE 1 TABLET BY MOUTH EVERY DAY 90 tablet 0  . apixaban (ELIQUIS) 5 MG TABS tablet Take 1 tablet (5 mg total) by mouth 2 (two) times daily. 60 tablet 5  . bismuth subsalicylate (PEPTO-BISMOL) 262 MG/15ML suspension Take 30 mLs by mouth every 6 (six) hours as needed for diarrhea or loose stools. 360 mL 0  . clopidogrel (PLAVIX) 75 MG tablet TAKE 1 TABLET BY MOUTH EVERY DAY 90 tablet 1  . DULoxetine (CYMBALTA) 30 MG capsule TAKE 1 CAPSULE BY MOUTH EVERY DAY 90 capsule 1  . ezetimibe (ZETIA) 10 MG tablet Take 1 tablet (10 mg total) by mouth daily. 30 tablet 5  . ferrous sulfate 325 (65 FE) MG tablet TAKE 1 TABLET (325 MG TOTAL) BY MOUTH 2 (TWO) TIMES DAILY WITH A MEAL. 093 tablet 0  . folic acid (FOLVITE) 235 MCG tablet Take 400 mcg by mouth daily.    . furosemide (LASIX) 40 MG tablet If lightheaded or SBP <100, then take 80mg  lasix a day.  If not lightheaded and SBP >100, then take 120mg  lasix a day. (Patient taking differently: If lightheaded or SBP <100, then take 80mg  lasix a day.  If not lightheaded and SBP >100, then take 120mg  lasix a day.  09/25/19-taking 1 QD) 30 tablet   . insulin aspart (NOVOLOG) 100 UNIT/ML injection Inject 0-8 Units into the skin as directed. Take 0 units if CBG 70-150 Take 1 units if CBG 151-200 Take 2 units if CBG 201-250 Take 3 units if CBG 251-300 Take 4 units if CBG 301-350 Take 6 units if CBG 351-400 If CBG > 400, give 8 units and call MD 10 mL   . insulin detemir (LEVEMIR) 100 UNIT/ML injection Inject 0.2 mLs (20 Units total) into the skin at bedtime.    [name] ipratropium-albuterol (DUONEB) 0.5-2.5 (3) MG/3ML SOLN Take 3 mLs by nebulization 4 (four) times daily.    . metoprolol succinate (TOPROL-XL) 25 MG 24 hr tablet Take 0.5 tablets (12.5 mg total) by mouth daily.  45 tablet 1  . Multiple Vitamin (MULTIVITAMIN WITH MINERALS) TABS tablet Take 1 tablet by mouth at bedtime.    . multivitamin (RENA-VIT) TABS tablet Take 1 tablet by mouth at bedtime.  0  . potassium chloride (KLOR-CON) 10 MEQ tablet TAKE 1 TABLET BY MOUTH EVERY DAY 30 tablet 1  . promethazine (PHENERGAN) 25 MG tablet Take 25 mg by mouth every 6 (six) hours as needed for nausea or vomiting.    . rosuvastatin (CRESTOR) 10 MG tablet TAKE 1 TABLET (10 MG TOTAL) BY MOUTH DAILY. 90 tablet 3  . predniSONE (DELTASONE) 50 MG tablet Take 25 mg by mouth daily.     No facility-administered medications prior to visit.     Per HPI unless specifically indicated in ROS section below Review of Systems Objective:  BP 132/72 (BP Location: Right Arm, Patient Position: Sitting, Cuff Size: Normal)   Pulse 92   Temp 97.6 F (36.4 C) (Temporal)   Ht 6' (1.829 m)   Wt 217 lb 2 oz (98.5 kg)   SpO2 97%   BMI 29.45 kg/m   Wt Readings from Last 3 Encounters:  10/04/19 217 lb 2 oz (98.5 kg)  09/25/19 207 lb 14.4 oz (94.3 kg)  09/17/19 197 lb 2 oz (89.4 kg)      Physical Exam Vitals and nursing note reviewed.  Constitutional:      Appearance: Normal appearance. He is ill-appearing.  HENT:     Nose:     Right Nostril: Epistaxis present. No foreign body or septal hematoma.     Left Nostril: Epistaxis present. No foreign body or septal hematoma.     Comments: L epistaxis resolves with gauze occlusion and nasal pinching but R epistaxis persists despite 1 hour of treatment in the office with gauze occlusino, ice pack, nasal pinching    Mouth/Throat:     Mouth: Mucous membranes are moist.     Pharynx: No posterior oropharyngeal erythema or uvula swelling.     Comments: Blood down posterior oropharynx Cardiovascular:     Rate and Rhythm: Normal rate and regular rhythm.     Pulses: Normal pulses.     Heart sounds: Normal heart sounds. No murmur heard.   Pulmonary:     Effort: Pulmonary effort is normal. No  respiratory distress.     Breath sounds: Normal breath sounds. No wheezing, rhonchi or rales.  Musculoskeletal:     Right lower leg: Edema (2+) present.     Left lower leg: Edema (2+) present.  Skin:    General: Skin is warm and dry.     Findings: Bruising present. No rash.  Neurological:     Mental Status: He is alert.  Psychiatric:  Mood and Affect: Mood normal.        Behavior: Behavior normal.       Results for orders placed or performed in visit on 09/25/19  Comprehensive metabolic panel  Result Value Ref Range   Sodium 142 135 - 145 mmol/L   Potassium 4.4 3.5 - 5.1 mmol/L   Chloride 105 98 - 111 mmol/L   CO2 28 22 - 32 mmol/L   Glucose, Bld 234 (H) 70 - 99 mg/dL   BUN 48 (H) 8 - 23 mg/dL   Creatinine, Ser 1.97 (H) 0.61 - 1.24 mg/dL   Calcium 8.2 (L) 8.9 - 10.3 mg/dL   Total Protein 6.6 6.5 - 8.1 g/dL   Albumin 2.6 (L) 3.5 - 5.0 g/dL   AST 77 (H) 15 - 41 U/L   ALT 64 (H) 0 - 44 U/L   Alkaline Phosphatase 290 (H) 38 - 126 U/L   Total Bilirubin 0.9 0.3 - 1.2 mg/dL   GFR calc non Af Amer 35 (L) >60 mL/min   GFR calc Af Amer 40 (L) >60 mL/min   Anion gap 9 5 - 15  CBC with Differential  Result Value Ref Range   WBC 10.4 4.0 - 10.5 K/uL   RBC 3.72 (L) 4.22 - 5.81 MIL/uL   Hemoglobin 11.2 (L) 13.0 - 17.0 g/dL   HCT 33.9 (L) 39 - 52 %   MCV 91.1 80.0 - 100.0 fL   MCH 30.1 26.0 - 34.0 pg   MCHC 33.0 30.0 - 36.0 g/dL   RDW 22.6 (H) 11.5 - 15.5 %   Platelets 220 150 - 400 K/uL   nRBC 0.0 0.0 - 0.2 %   Neutrophils Relative % 82 %   Neutro Abs 8.6 (H) 1.7 - 7.7 K/uL   Lymphocytes Relative 8 %   Lymphs Abs 0.9 0.7 - 4.0 K/uL   Monocytes Relative 8 %   Monocytes Absolute 0.8 0 - 1 K/uL   Eosinophils Relative 0 %   Eosinophils Absolute 0.0 0 - 0 K/uL   Basophils Relative 1 %   Basophils Absolute 0.1 0 - 0 K/uL   Immature Granulocytes 1 %   Abs Immature Granulocytes 0.08 (H) 0.00 - 0.07 K/uL   Lab Results  Component Value Date   HGBA1C 7.2 (A) 05/14/2019      Assessment & Plan:  This visit occurred during the SARS-CoV-2 public health emergency.  Safety protocols were in place, including screening questions prior to the visit, additional usage of staff PPE, and extensive cleaning of exam room while observing appropriate contact time as indicated for disinfecting solutions.   Problem List Items Addressed This Visit    Protein-calorie malnutrition, severe   Pedal edema    Persistent.  Will increase lasix to 40mg  BID over weekend, check labs again today.  Anticipate multifactorial - CHF, renal disease, liver disease, low albumin all contributing.  Encouraged renewed efforts at nutrition with protein with each meal, add protein supplement if unable to reach this goal.       Malignant neoplasm of unspecified part of unspecified bronchus or lung ([place])    Sees onc on Keytruda maintenance. Upcoming pan CT.       Hx of CABG   ESRD on hemodialysis (HCC)   Epistaxis - Primary    Persistent R nare bleed despite gauze and nasal pinching for 1 hour in office - advised go to [facility] today for further eval/treatment - as we [place]'t have any ability to do nasal tamponade  or afrin or other intervention available in the office.  I did ask him to hold eliquis over the weekend, reviewing small increased stroke risk.       Relevant Orders   Comprehensive metabolic panel   CBC with Differential/Platelet   Brain natriuretic peptide   Atrial fibrillation with RVR (HCC)   Alkaline phosphatase elevation       No orders of the defined types were placed in this encounter.  Orders Placed This Encounter  Procedures  . Comprehensive metabolic panel  . CBC with Differential/Platelet  . Brain natriuretic peptide    Patient Instructions  Labs today  Increase lasix to 40mg  twice daily over the weekend.  Hold eliquis over the weekend.  Due to ongoing nosebleed despite above, go to urgent care for further evaluation.   Follow up plan: Return if symptoms worsen or  fail to improve.  Ria Bush, MD

## 2019-10-05 LAB — TIQ-MISC

## 2019-10-07 ENCOUNTER — Telehealth: Payer: Self-pay

## 2019-10-07 NOTE — Telephone Encounter (Signed)
Lvm asking pt to call back.  Need to relay results and Dr. Synthia Innocent message.  Labs (BNP): Your BNP returned elevated consistent with CHF exacerbation.  How did leg swelling do with increased Lasix to 40mg  bid over weekend?  Also saw your were evaluated ER with nasal packing and was recommended ENT f/u this week.

## 2019-10-07 NOTE — Telephone Encounter (Signed)
Pt returned call. (see Labs, 10/04/19, BNP)

## 2019-10-07 NOTE — Chronic Care Management (AMB) (Signed)
Chronic Care Management Pharmacy  Name: Craig Koch  MRN: [id number] DOB: [birth year]  Chief Complaint/ HPI  Craig Koch,  64 y.o. , male presents for their Follow-Up CCM visit with the clinical pharmacist via telephone due to COVID-19 Pandemic.  PCP : Tonia Ghent, MD  Their chronic conditions include: hypertension, CAD, atrial fibrillation, NSTEMI, heart failure, Diabetes, hyperlipidemia, lower urinary tract symptoms,   Office Visits:  08/15/2019 - Trazodone 25-50 mg at night for sleep.   06/14/2019 - Increase furosemde 2 tablets daily until weight is down/swelling reduced.   05/14/2019 -Discussed tapering Levemir since his low sugars tend to happen in the early morning.  Goal AM sugar above 100.  Continue sliding scale insulin at baseline with meals.  He can update me about his sugar in the near future. Go to the lab on the way out for a chest xray. Amoxicillin and Doxycycline given for HCAP.   04/12/2019 - Discussed lowering his Levemir dose at nighttime and still continue with sliding scale insulin.  Goal to avoid low sugars.  Cut back on levemir by 1 unit a day until AM sugar ~120.  If you still have trouble with lows then let me know.  Use the inhaler before bed and see if that helps with the cough. Consult Visit:  10/05/2019 - ED for nosedbleed. Keflex presribed. Hold Plavix and Eliquis for 7 days.   09/25/2019 - Oncology - repeat CT at the end of October. Hold off on treatment and address other chronic problems.   09/17/2019 - Cardiology - defer titration of furosemide to nephrology.   09/13/2019 - Pulmonology - albuterol mdi, trelegy continued and Duoneb nebulizer initiated.   09/11/2019 - Oncology - no evidence of lung cancer recurrence or metastatic disease. Hold Keytruda due to increased alkaline phosphatase and tranaminitis. BRAF targeted therapy discontinued due to acute CHF, DKA, renal failure.   09/10/2019 - Cardiology - encouraged ace wraps for leg  swelling and elevating legs while sitting for long periods of time.   09/06/2019 - cardiology - decrease to furosemide 40 mg daily.   08/21/2019 - Oncology - infusion.   08/20/2019 - Palliative Care - no complaints or changes at this time.   07/31/2019 -  Furosemide 40 mg daily. Finish course of levaquin for exertional dyspnea.   07/30/2019 - Increase to lasix 43m qd for 3-5 days then drop back down to every other day furosemide 42m  Recheck BMET in 1 week. Loop in nephrology. Slow changes in position given h/o orthostatic hypotension. Discuss ongoing amiodarone with primary cardiologist (amiodarone lung considered as well). Smoking cessation advised as outlined below.   07/29/2019 - start levaquin for shortness of breath.   07/25/2019 -  Nephrology - . Chronic kidney disease stage IIIb. Most recent EGFR was found to be 39. Previously had acute kidney injury requiring dialysis but it appears that he is made a full recovery. However suspect that he has been left with some element of chronic kidney disease. Follow-up renal parameters today. Increase protein intake and continue furosemide qod.   07/10/2019 - oncology - infusion.   06/19/2019 - oncology infusion.   06/18/2019 - Palliative care. Coordinated nebulizer machine.  06/17/2019 - Nephrology. Doing well with dialysis. Furosemide 40 mg daily.   05/24/2019 - Cardiology - Lasix daily for now. Keflex after urine sample.   05/20/2019 - Nephrology - Acute kidney injury. Patient was started on dialysis in March 2021. We were able to successfully wean him off of dialysis. Most  recent creatinine was 2.28 with an EGFR of 29.   05/20/2019 - Oncology - Is currently off hemodialysis.  Creatinine may reflect his true kidney function without dialysis.  I advised patient to clarify with nephrology if any protein limitations.His nutrition status is low.  Albumin 2.7.  If no protein restriction per nephrology, recommend patient to take nutrition  supplements.  Continue follow-up with nutritionist.Anemia, likely secondary to CKD.  Monitor hemoglobin.  Erythropoietin is contraindicated due to active cancer.  Check iron panel at the next visit. Pneumonia,, currently on oral antibiotic treatments. Chest x-ray was independently reviewed and discussed patient. 05/10/2019 - Cardiology - Afib - amiodarone 200 mg daily. Restart Eliquis 5 mg bid. With ongoing or symptomatic hypotension, may need to consider decreasing or holding his BB to prevent prerenal AKI. Continue BB as BP allows, as well as rosuvastatin and newly started Zetia for risk factor modification.  05/08/2019 - Oncology - immunotherapy.  05/01/2019 - Oncology - Omniseq showed [id number] mutation, results were discussed with patient and wife.BRAF targeted therapy was discontinued due to patient's recent acute illness including acute CHF, DKA, renal failure on hemodialysis.   04/24/2019 - Nutrition consult for weight loss. Encouraged patient to consume adequate calories/protein.   04/18/2019 - palliative home care visit.   04/17/2019 - oncology - encounter for immunotherapy.   04/08/2019 - oncology - stage IV metastatic lung adenocarcinoma. Patient wants to try immunotherapy.   04/05/2019 - Cardiology - NYHA class III. Unable to add entresto or titrate up metoprolol succinate due to low bp. Palliative care involved.   03/26/2019 - ED to Hospital Admission for SOB.   03/03/2019 - ED to Hospital admission for DKA. Medications: Outpatient Encounter Medications as of 10/11/2019  Medication Sig  . apixaban (ELIQUIS) 5 MG TABS tablet Take 1 tablet (5 mg total) by mouth 2 (two) times daily.  . [DISCONTINUED] clopidogrel (PLAVIX) 75 MG tablet TAKE 1 TABLET BY MOUTH EVERY DAY  . albuterol (VENTOLIN HFA) 108 (90 Base) MCG/ACT inhaler Inhale 2 puffs into the lungs every 6 (six) hours as needed for wheezing or shortness of breath.  . bismuth subsalicylate (PEPTO-BISMOL) 262 MG/15ML suspension  Take 30 mLs by mouth every 6 (six) hours as needed for diarrhea or loose stools.  . [EXPIRED] cephALEXin (KEFLEX) 500 MG capsule Take 1 capsule (500 mg total) by mouth 3 (three) times daily for 7 days.  . DULoxetine (CYMBALTA) 30 MG capsule TAKE 1 CAPSULE BY MOUTH EVERY DAY  . ezetimibe (ZETIA) 10 MG tablet Take 1 tablet (10 mg total) by mouth daily.  . ferrous sulfate 325 (65 FE) MG tablet TAKE 1 TABLET (325 MG TOTAL) BY MOUTH 2 (TWO) TIMES DAILY WITH A MEAL.  . folic acid (FOLVITE) 174 MCG tablet Take 400 mcg by mouth daily.  . insulin aspart (NOVOLOG) 100 UNIT/ML injection Inject 0-8 Units into the skin as directed. Take 0 units if CBG 70-150 Take 1 units if CBG 151-200 Take 2 units if CBG 201-250 Take 3 units if CBG 251-300 Take 4 units if CBG 301-350 Take 6 units if CBG 351-400 If CBG > 400, give 8 units and call MD  . insulin detemir (LEVEMIR) 100 UNIT/ML injection Inject 0.2 mLs (20 Units total) into the skin at bedtime.  [name] ipratropium-albuterol (DUONEB) 0.5-2.5 (3) MG/3ML SOLN Take 3 mLs by nebulization 4 (four) times daily.  . metoprolol succinate (TOPROL-XL) 25 MG 24 hr tablet Take 0.5 tablets (12.5 mg total) by mouth daily.  . Multiple Vitamin (MULTIVITAMIN WITH MINERALS)  TABS tablet Take 1 tablet by mouth at bedtime.  . multivitamin (RENA-VIT) TABS tablet Take 1 tablet by mouth at bedtime.  . potassium chloride (KLOR-CON) 10 MEQ tablet TAKE 1 TABLET BY MOUTH EVERY DAY  . promethazine (PHENERGAN) 25 MG tablet Take 25 mg by mouth every 6 (six) hours as needed for nausea or vomiting.  . rosuvastatin (CRESTOR) 10 MG tablet TAKE 1 TABLET (10 MG TOTAL) BY MOUTH DAILY.  . [DISCONTINUED] amiodarone (PACERONE) 200 MG tablet TAKE 1 TABLET BY MOUTH EVERY DAY  . [DISCONTINUED] ferrous sulfate 325 (65 FE) MG tablet TAKE 1 TABLET (325 MG TOTAL) BY MOUTH 2 (TWO) TIMES DAILY WITH A MEAL.  . [DISCONTINUED] furosemide (LASIX) 40 MG tablet If lightheaded or SBP <100, then take 38m lasix a day.  If  not lightheaded and SBP >100, then take 1270mlasix a day. (Patient taking differently: If lightheaded or SBP <100, then take 8015masix a day.  If not lightheaded and SBP >100, then take 120m60msix a day.  09/25/19-taking 1 QD)  . [DISCONTINUED] potassium chloride (KLOR-CON) 10 MEQ tablet TAKE 1 TABLET BY MOUTH EVERY DAY   No facility-administered encounter medications on file as of 10/11/2019.   Allergies  Allergen Reactions  . Promethazine     QT prolongation.   . Trazodone And Nefazodone     QT prolongation.   . Lipitor [Atorvastatin Calcium] Other (See Comments)    Aches.  Tolerated crestor.    SDOH Screenings   Alcohol Screen:   . Last Alcohol Screening Score (AUDIT): Not on file  Depression (PHQ2-9):   . PHQ-2 Score: Not on file  Financial Resource Strain:   . Difficulty of Paying Living Expenses: Not on file  Food Insecurity:   . Worried About RunnCharity fundraiserthe Last Year: Not on file  . Ran Out of Food in the Last Year: Not on file  Housing:   . Last Housing Risk Score: Not on file  Physical Activity:   . Days of Exercise per Week: Not on file  . Minutes of Exercise per Session: Not on file  Social Connections:   . Frequency of Communication with Friends and Family: Not on file  . Frequency of Social Gatherings with Friends and Family: Not on file  . Attends Religious Services: Not on file  . Active Member of Clubs or Organizations: Not on file  . Attends ClubArchivisttings: Not on file  . Marital Status: Not on file  Stress:   . Feeling of Stress : Not on file  Tobacco Use: High Risk  . Smoking Tobacco Use: Current Every Day Smoker  . Smokeless Tobacco Use: Former UserSoil scientistds:   . LackFilm/video editordical): Not on file  . Lack of Transportation (Non-Medical): Not on file    Current Diagnosis/Assessment:  Goals Addressed            This Visit's Progress   . Pharmacy Care Plan       CARE PLAN ENTRY (see  longitudinal plan of care for additional care plan information)  Current Barriers:  . Chronic Disease Management support, education, and care coordination needs related to Diabetes and Atrial Fibrillation   Diabetes Lab Results  Component Value Date/Time   HGBA1C 7.2 (A) 05/14/2019 08:54 AM   HGBA1C 8.8 (H) 03/26/2019 09:55 PM   HGBA1C 11.3 (H) 03/04/2019 10:59 AM   . Pharmacist Clinical Goal(s): o Over the next 90 days, patient will work with  PharmD and providers to achieve A1c goal <7% . Current regimen:   Novolog 0-8 units sliding scale before meals   Levemir 100 units/ml 20 units at bedtime . Interventions: o Working to improve access to insulin regimen via patient assistance.  . Patient self care activities - Over the next 90 days, patient will: o Check blood sugar 3-4 times daily, document, and provide at future appointments o Contact provider with any episodes of hypoglycemia o Complete Patient Assistance Application, have provider sign and submit.    Atrial Fibrillation . Pharmacist Clinical Goal(s) o Over the next 90 days, patient will work with PharmD and providers to work on [employer] affordability . Current regimen:   Eliquis 5 mg bid   Amiodarone 200 mg daily . Interventions: o Complete patient assistance application and submit to company for Eliquis.  . Patient self care activities - Over the next 90 days, patient will: o Continue to take medications as prescribed.  o Contact pharmacist or provider with any questions or concerns.   Medication management . Pharmacist Clinical Goal(s): o Over the next 90 days, patient will work with PharmD and providers to maintain optimal medication adherence . Current [facility] . Interventions o Comprehensive medication review performed. o Continue current medication management strategy . Patient self care activities - Over the next 90 days, patient will: o Focus on medication adherence by continuing to use pill box.    o Take medications as prescribed o Report any questions or concerns to PharmD and/or provider(s)  Please see past updates related to this goal by clicking on the "Past Updates" button in the selected goal         Diabetes   Recent Relevant Labs: Lab Results  Component Value Date/Time   HGBA1C 7.2 (A) 05/14/2019 08:54 AM   HGBA1C 8.8 (H) 03/26/2019 09:55 PM   HGBA1C 11.3 (H) 03/04/2019 10:59 AM   MICROALBUR 9.9 (H) 02/17/2015 03:25 PM    Kidney Function Lab Results  Component Value Date/Time   CREATININE 2.08 (H) 10/17/2019 09:14 AM   CREATININE 1.85 (H) 10/04/2019 08:11 PM   CREATININE 2.36 (H) 06/14/2019 03:46 PM   GFR 40.72 (L) 08/23/2019 08:11 AM   GFRNONAA 33 (L) 10/17/2019 09:14 AM   GFRAA 38 (L) 10/17/2019 09:14 AM   K 3.6 10/17/2019 09:14 AM   K 4.2 10/04/2019 08:11 PM     Checking BG: 3x per Day before meals and at bedtime.   Recent FBG Readings: 180 mg/dL  Recent pre-meal BG readings: 130-170 mg/dL Recent HS BG readings: 200 mg/dL Patient has failed these meds in past: byetta, glipizide, metformin Patient is currently uncontrolled on the following medications:   Novolog 0-8 units sliding scale before meals   Levemir 100 units/ml 20 units at bedtime  Last diabetic Foot exam:  Lab Results  Component Value Date/Time   HMDIABEYEEXA Retinopathy (A) 09/06/2017 12:00 AM    Last diabetic Eye exam: No results found for: HMDIABFOOTEX   We discussed: diet and exercise extensively. Patient encouraged to get calorie/protein per oncology. He is eating a variety of foods trying to be mindful of protein. Patient's insulins are expensive on top of all other medical expenses. Pharmacist working with Dr. Damita Dunnings to get Novolog and Levemir approved via patient assistance.   Update 10/11/2019 - Reports "good" blood sugars. Occasional 230 mg/dL. Overall less thatn 190 mg/dL. Sliding scale insulin for meal time. Uses 6-15 units depending on blood sugar. He states that he  adjusts it on his own. Reports  rarely low blood sugar.   Plan  Continue current medications and submit patient assistance application.   AFIB   Patient is currently rhythm controlled. HR 82 BPM  Patient has failed these meds in past: n/a Patient is currently controlled on the following medications:   Eliquis 5 mg bid   Amiodarone 200 mg daily  We discussed:  Patient's Eliquis is cost prohibitive at this time. Pharmacist coordinating with cardiology to work on PAP application and to check for samples. Patient advised to bring proof of income and proof of medication expenses to the cardiology visit 08/06/2019.   Update 10/11/2019 - Patient has not submitted application for Eliquis at this time have encouraged patient to submit. Application is completed and signed.   Plan  Continue current medications   Hyperlipidemia   LDL goal < 70  Lipid Panel     Component Value Date/Time   CHOL 85 (L) 05/24/2019 1042   TRIG 47 05/24/2019 1042   HDL 41 05/24/2019 1042   LDLCALC 32 05/24/2019 1042   LDLDIRECT 31 05/24/2019 1042   LDLDIRECT 84.0 08/18/2015 0850    Hepatic Function Latest Ref Rng & Units 10/04/2019 09/25/2019 09/11/2019  Total Protein 6.5 - 8.1 g/dL 6.3(L) 6.6 7.2  Albumin 3.5 - 5.0 g/dL 2.7(L) 2.6(L) 2.4(L)  AST 15 - 41 U/L 58(H) 77(H) 58(H)  ALT 0 - 44 U/L 65(H) 64(H) 48(H)  Alk Phosphatase 38 - 126 U/L 240(H) 290(H) 507(H)  Total Bilirubin 0.3 - 1.2 mg/dL 1.7(H) 0.9 0.8     The ASCVD Risk score Mikey Bussing DC Jr., et al., 2013) failed to calculate for the following reasons:   The patient has a prior MI or stroke diagnosis   Patient has failed these meds in past: none reported Patient is currently controlled on the following medications:  . Rosuvastatin 10 mg daily . Zetia 10 mg daily   We discussed:  diet and exercise extensively   Update: Patient reports taking daily as prescribed. Lipid panel at goal.   Plan  Continue current medications   COPD / Asthma /  Tobacco   Eosinophil count:   Lab Results  Component Value Date/Time   EOSPCT 0 10/04/2019 08:11 PM  %                               Eos (Absolute):  Lab Results  Component Value Date/Time   EOSABS 0.1 10/17/2019 09:14 AM    Tobacco Status:  Social History   Tobacco Use  Smoking Status Current Every Day Smoker  . Packs/day: 0.20  . Years: 45.00  . Pack years: 9.00  . Types: Cigarettes  . Last attempt to quit: 03/05/2019  . Years since quitting: 0.6  Smokeless Tobacco Former Systems developer  . Types: Snuff    Patient has failed these meds in past: none reported Patient is currently uncontrolled on the following medications:   Duoneb 1 vial via nebulzer every 4 hours prn  Albuterol inhaler 2 puffs every 6 hours prn  We discussed:  smoking cessation.   Smoking 3-4 cigarettes per day right now. 50 year habit.   Plan  Continue current medications     Heart Failure   Type: Combined Systolic and Diastolic  Last ejection fraction: 30-35% NYHA Class: III (marked limitation of activity)  Vitals with BMI 10/17/2019 10/11/2019 10/04/2019  Height '6\' 0"'  '6\' 0"'  '6\' 0"'   Weight 216 lbs (No Data) 210 lbs  BMI 29.29 -  37.85  Systolic 885 027 741  Diastolic 64 66 63  Pulse 77 81 90    Patient has failed these meds in past: none reported  Patient is currently uncontrolled on the following medications:   Furosemide 40 mg bid  Metoprolol succinate 25 mg - 1/2 tablet daily   We discussed weighing daily; if you gain more than 3 pounds in one day or 5 pounds in one week call your doctor. Patient is frustrated with swelling in legs. States he is unable to wear shoes. He reports elevating legs during the day. States he has an appointment with PCP office this afternoon to assess blister on his foot. Discussed following-up with cardiology.   Plan  Continue current medications  Health Maintenance   Patient is currently controlled on the following medications:  . Peptobismol - 30 mls  every 6 hours prn diarrhea or loose stools . Duloxetine 30 mg daily  . Ferrous sulfate 325 mg twice daily  . Folic acid 287 mcg daily  . Multiple vitamin daily  . Potassium chloride 10 meq daily . Promethazine 25 mg every 6 hours prn nausea or vomiting    Plan  Continue current medications   Medication Management   [facility] Uses pill box? Yes Pt endorses good compliance - Patient's fill history does not have gaps outside of hospitalizations.   We discussed: Patient's wife picks up his medications and prepares it each day for him. Patient is not driving at this time due to his health.   Plan  Continue current medication management strategy. Immediate needs addressed during today's visit to ensure patient has medication needed. Patient's Eliquis and Insulins are expensive on top of all other medical costs.     Follow up: 2 months

## 2019-10-08 LAB — EXTRA SPECIMEN

## 2019-10-08 LAB — COMPREHENSIVE METABOLIC PANEL

## 2019-10-08 LAB — BRAIN NATRIURETIC PEPTIDE: Brain Natriuretic Peptide: 2647 pg/mL — ABNORMAL HIGH (ref <100)

## 2019-10-08 LAB — CBC WITH DIFFERENTIAL/PLATELET

## 2019-10-09 ENCOUNTER — Telehealth: Payer: Self-pay

## 2019-10-09 DIAGNOSIS — T45515A Adverse effect of anticoagulants, initial encounter: Secondary | ICD-10-CM | POA: Diagnosis not present

## 2019-10-09 DIAGNOSIS — R040 Epistaxis: Secondary | ICD-10-CM | POA: Diagnosis not present

## 2019-10-09 NOTE — Telephone Encounter (Signed)
I spoke with Craig Koch at [facility] samples received frozen could not perform CBC or CMP but BNP was done. Sending to Karna Christmas and Irene Shipper in lab and Dr Damita Dunnings.

## 2019-10-09 NOTE — Telephone Encounter (Signed)
[place] Night - Client Nonclinical Telephone Record AccessNurse Client [facility] Night - Client Client Site [place] Physician Ria Bush - MD Contact Type Call Who Is Calling Lab Lab Name [facility] Phone Number [phone] Lab Tech Name Mellody Drown Reference Number [id number] Q Patient Name Lakeith Careaga Patient DOB 1955-08-05 Call Type Lab Message Only Reason for Call Report lab results Initial Comment Lattie Haw with [facility]. She has a test for a patient that couldn't be done. Disp. Time Disposition Final User 10/08/2019 5:08:58 PM General Information Provided Yes Tyler Deis Call Closed By: Tyler Deis Transaction Date/Time: 10/08/2019 5:05:11 PM (ET)

## 2019-10-09 NOTE — Telephone Encounter (Signed)
He had other labs collected on 10/1 at the ER. Terri-can you talk to me about this tomorrow?  Thanks.

## 2019-10-10 ENCOUNTER — Other Ambulatory Visit: Payer: Self-pay | Admitting: Family Medicine

## 2019-10-11 ENCOUNTER — Encounter: Payer: Self-pay | Admitting: Family Medicine

## 2019-10-11 ENCOUNTER — Other Ambulatory Visit: Payer: Self-pay | Admitting: Family Medicine

## 2019-10-11 ENCOUNTER — Ambulatory Visit (INDEPENDENT_AMBULATORY_CARE_PROVIDER_SITE_OTHER): Payer: Medicare Other | Admitting: Family Medicine

## 2019-10-11 ENCOUNTER — Ambulatory Visit: Payer: Medicare Other

## 2019-10-11 ENCOUNTER — Inpatient Hospital Stay: Payer: Medicare Other | Attending: Hospice and Palliative Medicine | Admitting: Hospice and Palliative Medicine

## 2019-10-11 ENCOUNTER — Other Ambulatory Visit: Payer: Self-pay

## 2019-10-11 VITALS — BP 110/66 | HR 81 | Temp 97.7°F | Ht 72.0 in

## 2019-10-11 DIAGNOSIS — S90829A Blister (nonthermal), unspecified foot, initial encounter: Secondary | ICD-10-CM | POA: Insufficient documentation

## 2019-10-11 DIAGNOSIS — I4891 Unspecified atrial fibrillation: Secondary | ICD-10-CM

## 2019-10-11 DIAGNOSIS — E118 Type 2 diabetes mellitus with unspecified complications: Secondary | ICD-10-CM

## 2019-10-11 DIAGNOSIS — R600 Localized edema: Secondary | ICD-10-CM

## 2019-10-11 DIAGNOSIS — S61419A Laceration without foreign body of unspecified hand, initial encounter: Secondary | ICD-10-CM | POA: Insufficient documentation

## 2019-10-11 DIAGNOSIS — Z515 Encounter for palliative care: Secondary | ICD-10-CM

## 2019-10-11 DIAGNOSIS — D509 Iron deficiency anemia, unspecified: Secondary | ICD-10-CM

## 2019-10-11 DIAGNOSIS — S61412A Laceration without foreign body of left hand, initial encounter: Secondary | ICD-10-CM | POA: Diagnosis not present

## 2019-10-11 MED ORDER — MUPIROCIN 2 % EX OINT: 1.0000 "application " | TOPICAL_OINTMENT | Freq: Two times a day (BID) | CUTANEOUS | 0 refills | Status: DC

## 2019-10-11 NOTE — Progress Notes (Signed)
Was unable to reach patient for scheduled MyChart visit.  I called and left him a voicemail.  Will reschedule.

## 2019-10-11 NOTE — Telephone Encounter (Signed)
Electronic refill request. Amiodarone Last office visit:   10/04/19 Dr. Darnell Level Last Filled:    90 tablet 0 07/16/2019

## 2019-10-11 NOTE — Progress Notes (Signed)
Subjective:    Patient ID: Craig Koch, male    DOB: [birth year], 64 y.o.   MRN: [id number]  This visit occurred during the SARS-CoV-2 public health emergency.  Safety protocols were in place, including screening questions prior to the visit, additional usage of staff PPE, and extensive cleaning of exam room while observing appropriate contact time as indicated for disinfecting solutions.    HPI 64 yo pt of Dr Damita Dunnings presents with blister on foot with swollen legs with hx of smoking and CHF and diabetes and renal disease  He is a palliative care pt for lung cancer   He was in dialysis and improved -no longer   Wt Readings from Last 3 Encounters:  10/04/19 210 lb (95.3 kg)  10/04/19 217 lb 2 oz (98.5 kg)  09/25/19 207 lb 14.4 oz (94.3 kg)   28.48 kg/m  Has large blister on both feet  One intact on R foot over big toe  One on L foot burst - still bothersome  (feels raw)   He thinks he has neuropathy  Diabetic Lab Results  Component Value Date   HGBA1C 7.2 (A) 05/14/2019     No trauma that he knows of  Shoes are tighter (he does not wear them in the house)  He is careful   He is swelling also - hurts when he tries to walk  No one is able to help that -per pt  Heart doctor is aware   He is already on keflex from ENT  BP Readings from Last 3 Encounters:  10/11/19 110/66  10/04/19 122/63  10/04/19 132/72  he takes 80 mg of lasix daily if systolic under 568 and 127 if it is over 100  Pt says he was told to take 40 mg twice daily  K has gone low in the past    Lab Results  Component Value Date   CREATININE 1.85 (H) 10/04/2019   BUN 58 (H) 10/04/2019   NA 137 10/04/2019   K 4.2 10/04/2019   CL 101 10/04/2019   CO2 22 10/04/2019    Patient Active Problem List   Diagnosis Date Noted  . Blister of foot 10/11/2019  . Skin tear of hand without complication 51/70/0174  . Epistaxis 10/04/2019  . Stage 4 chronic kidney disease ([place]) 09/25/2019  . Alkaline  phosphatase elevation 09/25/2019  . Confusion 09/25/2019  . Hallucination 08/19/2019  . Encounter for antineoplastic immunotherapy 04/17/2019  . Fatigue 04/17/2019  . Weight loss 04/17/2019  . HCAP (healthcare-associated pneumonia) 03/26/2019  . Hypoxemia 03/26/2019  . ESRD on hemodialysis ([place]) 03/26/2019  . Hx of CABG 03/26/2019  . AF (paroxysmal atrial fibrillation) ([place]) 03/26/2019  . Acute on chronic combined systolic (congestive) and diastolic (congestive) heart failure ([place]) 03/26/2019  . S/P thoracentesis   . Pleural effusion   . Acute systolic heart failure ([place])   . Pain of right upper extremity   . Microcytic anemia   . Protein-calorie malnutrition, severe 03/06/2019  . Acute alteration in mental status   . Palliative care encounter   . Atrial fibrillation with RVR ([place]) 03/03/2019  . DKA (diabetic ketoacidoses) 03/03/2019  . Hypothermia 03/03/2019  . SOB (shortness of breath) 03/03/2019  . Acute renal failure ([place]) 03/03/2019  . NSTEMI (non-ST elevated myocardial infarction) ([place]) 03/03/2019  . Hyponatremia 02/08/2019  . Uncontrolled type 2 diabetes mellitus with hyperglycemia ([place]) 02/08/2019  . Pedal edema 02/08/2019  . Encounter for antineoplastic chemotherapy 12/08/2018  . Tobacco abuse 10/12/2018  .  Administrative encounter 09/23/2018  . Malignant neoplasm of unspecified part of unspecified bronchus or lung ([place]) 09/08/2018  . Goals of care, counseling/discussion 09/08/2018  . Recurrent pleural effusion on left 09/02/2018  . Elevated troponin 08/24/2018  . Medicare annual wellness visit, subsequent 08/07/2018  . Lower urinary tract symptoms (LUTS) 08/07/2018  . Neck pain 08/07/2018  . CAD (coronary artery disease), native coronary artery 09/23/2017  . Smoker 09/23/2017  . Chronic joint pain 10/18/2016  . Healthcare maintenance 07/17/2016  . Advance care planning 07/17/2016  . Fungal rash of torso 05/06/2016  . Trigger finger 05/06/2016  . History of  alcohol use 11/17/2015  . Skin tear of forearm without complication 81/01/7508  . Anxiety state 02/19/2015  . Hand weakness 05/23/2011  . Diabetes mellitus with complication ([place]) 25/85/2778  . HLD (hyperlipidemia) 03/05/2010  . Essential hypertension 03/05/2010  . Coronary atherosclerosis 03/05/2010   Past Medical History:  Diagnosis Date  . Anxiety   . Arthritis   . CHF (congestive heart failure) ([place])   . Coronary artery disease   . Diabetes mellitus   . Dyslipidemia   . Hx of CABG   . Hyperlipidemia   . Hypertension   . Malignant neoplasm of unspecified part of unspecified bronchus or lung ([place]) 08/2018   Immunotherapy  . Pleural effusion    Past Surgical History:  Procedure Laterality Date  . CATARACT EXTRACTION Left 09/2015  . CHEST TUBE INSERTION Left 10/01/2018   Procedure: INSERTION PLEURAL DRAINAGE CATHETER;  Surgeon: [name], MD;  Location: [facility];  Service: Thoracic;  Laterality: Left;  . CORONARY ARTERY BYPASS GRAFT  2004   (CABG with LIMA to the  LAD, SVG to OM2/OM3, SVG  to diag  . DIALYSIS/PERMA CATHETER INSERTION N/A 03/14/2019   Procedure: DIALYSIS/PERMA CATHETER INSERTION;  Surgeon: Algernon Huxley, MD;  Location: [place] [facility];  Service: Cardiovascular;  Laterality: N/A;  . DIALYSIS/PERMA CATHETER REMOVAL N/A 05/28/2019   Procedure: DIALYSIS/PERMA CATHETER REMOVAL;  Surgeon: Katha Cabal, MD;  Location: [place] [facility];  Service: Cardiovascular;  Laterality: N/A;  . Left ankle surgery     repair of fracture  . Right lower leg surgery     rod  . TEMPORARY DIALYSIS CATHETER N/A 03/11/2019   Procedure: TEMPORARY DIALYSIS CATHETER;  Surgeon: Algernon Huxley, MD;  Location: [place] [facility];  Service: Cardiovascular;  Laterality: N/A;   Social History   Tobacco Use  . Smoking status: Current Every Day Smoker    Packs/day: 0.20    Years: 45.00    Pack years: 9.00    Types: Cigarettes    Last attempt to quit: 03/05/2019    Years since  quitting: 0.6  . Smokeless tobacco: Former Systems developer    Types: Snuff  Vaping Use  . Vaping Use: Never used  Substance Use Topics  . Alcohol use: Not Currently    Alcohol/week: 6.0 standard drinks    Types: 6 Cans of beer per week    Comment: occ, average 6 pack in a week  . Drug use: No   Family History  Problem Relation Age of Onset  . Dementia Mother   . Heart disease Father   . Colon cancer Neg Hx   . Prostate cancer Neg Hx   . Diabetes Neg Hx    Allergies  Allergen Reactions  . Promethazine     QT prolongation.   . Trazodone And Nefazodone     QT prolongation.   . Lipitor [Atorvastatin Calcium]  Other (See Comments)    Aches.  Tolerated crestor.    Current Outpatient Medications on File Prior to Visit  Medication Sig Dispense Refill  . amiodarone (PACERONE) 200 MG tablet TAKE 1 TABLET BY MOUTH EVERY DAY 90 tablet 0  . apixaban (ELIQUIS) 5 MG TABS tablet Take 1 tablet (5 mg total) by mouth 2 (two) times daily. 60 tablet 5  . bismuth subsalicylate (PEPTO-BISMOL) 262 MG/15ML suspension Take 30 mLs by mouth every 6 (six) hours as needed for diarrhea or loose stools. 360 mL 0  . clopidogrel (PLAVIX) 75 MG tablet TAKE 1 TABLET BY MOUTH EVERY DAY 90 tablet 1  . DULoxetine (CYMBALTA) 30 MG capsule TAKE 1 CAPSULE BY MOUTH EVERY DAY 90 capsule 1  . ezetimibe (ZETIA) 10 MG tablet Take 1 tablet (10 mg total) by mouth daily. 30 tablet 5  . folic acid (FOLVITE) 195 MCG tablet Take 400 mcg by mouth daily.    . furosemide (LASIX) 40 MG tablet If lightheaded or SBP <100, then take 80mg  lasix a day.  If not lightheaded and SBP >100, then take 120mg  lasix a day. (Patient taking differently: If lightheaded or SBP <100, then take 80mg  lasix a day.  If not lightheaded and SBP >100, then take 120mg  lasix a day.  09/25/19-taking 1 QD) 30 tablet   . insulin aspart (NOVOLOG) 100 UNIT/ML injection Inject 0-8 Units into the skin as directed. Take 0 units if CBG 70-150 Take 1 units if CBG 151-200 Take 2  units if CBG 201-250 Take 3 units if CBG 251-300 Take 4 units if CBG 301-350 Take 6 units if CBG 351-400 If CBG > 400, give 8 units and call MD 10 mL   . insulin detemir (LEVEMIR) 100 UNIT/ML injection Inject 0.2 mLs (20 Units total) into the skin at bedtime.    [name] ipratropium-albuterol (DUONEB) 0.5-2.5 (3) MG/3ML SOLN Take 3 mLs by nebulization 4 (four) times daily.    . metoprolol succinate (TOPROL-XL) 25 MG 24 hr tablet Take 0.5 tablets (12.5 mg total) by mouth daily. 45 tablet 1  . Multiple Vitamin (MULTIVITAMIN WITH MINERALS) TABS tablet Take 1 tablet by mouth at bedtime.    . multivitamin (RENA-VIT) TABS tablet Take 1 tablet by mouth at bedtime.  0  . potassium chloride (KLOR-CON) 10 MEQ tablet TAKE 1 TABLET BY MOUTH EVERY DAY 30 tablet 1  . promethazine (PHENERGAN) 25 MG tablet Take 25 mg by mouth every 6 (six) hours as needed for nausea or vomiting.    . rosuvastatin (CRESTOR) 10 MG tablet TAKE 1 TABLET (10 MG TOTAL) BY MOUTH DAILY. 90 tablet 3  . albuterol (VENTOLIN HFA) 108 (90 Base) MCG/ACT inhaler Inhale 2 puffs into the lungs every 6 (six) hours as needed for wheezing or shortness of breath. 18 g 0   No current facility-administered medications on file prior to visit.    Review of Systems  Constitutional: Positive for fatigue. Negative for activity change, appetite change, fever and unexpected weight change.  HENT: Negative for congestion, rhinorrhea, sore throat and trouble swallowing.   Eyes: Negative for pain, redness, itching and visual disturbance.  Respiratory: Negative for cough, chest tightness, shortness of breath and wheezing.        Sob on exertion baseline   Cardiovascular: Negative for chest pain and palpitations.       Poor exercise tolerance  Gastrointestinal: Negative for abdominal pain, blood in stool, constipation, diarrhea and nausea.  Endocrine: Negative for cold intolerance, heat intolerance, polydipsia and polyuria.  Genitourinary: Negative for difficulty  urinating, dysuria, frequency and urgency.  Musculoskeletal: Negative for arthralgias, joint swelling and myalgias.  Skin: Positive for wound. Negative for pallor and rash.  Neurological: Negative for dizziness, tremors, weakness, numbness and headaches.  Hematological: Negative for adenopathy. Does not bruise/bleed easily.  Psychiatric/Behavioral: Negative for decreased concentration and dysphoric mood. The patient is not nervous/anxious.        Objective:   Physical Exam Constitutional:      General: He is not in acute distress.    Appearance: He is normal weight. He is not ill-appearing or diaphoretic.     Comments: Frail and chronically ill appearing   HENT:     Mouth/Throat:     Mouth: Mucous membranes are moist.  Eyes:     General: No scleral icterus.    Conjunctiva/sclera: Conjunctivae normal.     Pupils: Pupils are equal, round, and reactive to light.  Cardiovascular:     Rate and Rhythm: Normal rate and regular rhythm.     Comments: Difficult to palp pedal pulses due to edema  Pulmonary:     Effort: Pulmonary effort is normal.     Comments: Decreased bs at bases  Musculoskeletal:     Cervical back: Neck supple.     Right lower leg: Edema present.     Left lower leg: Edema present.     Comments: 1-2 plus pedal edema   Lymphadenopathy:     Cervical: No cervical adenopathy.  Skin:    Comments: R foot- large vesicle (intact) 5 by 2 cm) superior/medial to great toe   L foot- recently popped vesicle on medial foot (same size) with nl appearing wet tissue underneath  No redness or signs of infection   Neurological:     Mental Status: He is alert.     Comments: Sensation in toes is slt diminished   Generally weak  Psychiatric:        Mood and Affect: Mood normal.           Assessment & Plan:   Problem List Items Addressed This Visit      Musculoskeletal and Integument   Blister of foot - Primary    Both feet (one on L burst)  No signs of infection and all  fluid was clear  Suspect these developed from friction of wearing shoes on swollen feet that were too small  L foot wound dressed with non stick pad/abx oint and coban  Enc to not pop the intact vesicle  Also avoid friction  Discussed wound care- clean with soap and water/ avoid friction/ bactroban oint (px) and non stick bandages Disc risk of ulcers with DM  Will f/u next week for re check  If worse in meantime will call      Skin tear of hand without complication    Small triangular skin tear of L hand in snuffbox area  Wrapped with gauze/mild compression (coban) to help stop bleeding (pt taking eliquis)  Discussed wound care        Other   Pedal edema    Suspect from CHF and low albumin in medically complex pt (renal failure and DM and lung cancer0 Enc leg elevation  F/u with cardiology-plans to call today for further adv re: lasix dose  bp is on the low end today

## 2019-10-11 NOTE — Patient Instructions (Addendum)
Call your cardiologist and let them know that your leg swelling is worse   Elevate you legs when you can  Avoid friction to any area of feet   Clean the blisters and the skin tear with soap and water  Use non stick pad and the antibiotic ointment   Watch for increased redness/pain or discharge   Follow up with Dr Damita Dunnings for a re check next week

## 2019-10-13 NOTE — Assessment & Plan Note (Signed)
Both feet (one on L burst)  No signs of infection and all fluid was clear  Suspect these developed from friction of wearing shoes on swollen feet that were too small  L foot wound dressed with non stick pad/abx oint and coban  Enc to not pop the intact vesicle  Also avoid friction  Discussed wound care- clean with soap and water/ avoid friction/ bactroban oint (px) and non stick bandages Disc risk of ulcers with DM  Will f/u next week for re check  If worse in meantime will call

## 2019-10-13 NOTE — Assessment & Plan Note (Signed)
Small triangular skin tear of L hand in snuffbox area  Wrapped with gauze/mild compression (coban) to help stop bleeding (pt taking eliquis)  Discussed wound care

## 2019-10-13 NOTE — Assessment & Plan Note (Signed)
Suspect from CHF and low albumin in medically complex pt (renal failure and DM and lung cancer0 Enc leg elevation  F/u with cardiology-plans to call today for further adv re: lasix dose  bp is on the low end today

## 2019-10-14 ENCOUNTER — Other Ambulatory Visit: Payer: Self-pay | Admitting: Nurse Practitioner

## 2019-10-14 ENCOUNTER — Telehealth: Payer: Self-pay | Admitting: Nurse Practitioner

## 2019-10-14 ENCOUNTER — Telehealth: Payer: Self-pay | Admitting: Cardiovascular Disease

## 2019-10-14 MED ORDER — AMIODARONE HCL 200 MG PO TABS: 200.0000 mg | ORAL_TABLET | Freq: Every day | ORAL | 3 refills | Status: AC

## 2019-10-14 NOTE — Telephone Encounter (Signed)
Spoke with patient and he reports excessive swelling to his lower extremities. He reports taking 40 mg twice a day of his furosemide and it is not helping. Encouraged to decrease fluid intake, monitor fluid out take, and to elevated legs, wear compression socks/hose, elevated legs, or wrap them with ace wraps. He states that he just needs some help with this because he can't even lift up his legs up. Offered appointment this week with APP and he declined coming to see her because she just said the same things I mentioned. Offered him appointment with different provider and he was agreeable. Patient scheduled to see different APP and he was agreeable with this plan with no further questions at this time.

## 2019-10-14 NOTE — Telephone Encounter (Signed)
Pt c/o swelling: STAT is pt has developed SOB within 24 hours  1) How much weight have you gained and in what time span? Not sure, maybe 20 pounds, wife is uncertain.   2) If swelling, where is the swelling located? Bilateral feet upward. 2 blisters that burst over the weekend, PCP has prescribed cream to apply, advised he call Dr. Rockey Situ  3) Are you currently taking a fluid pill? yes  4) Are you currently SOB? no  5) Do you have a log of your daily weights (if so, list)? No log   6) Have you gained 3 pounds in a day or 5 pounds in a week? Not sure  7) Have you traveled recently? no

## 2019-10-14 NOTE — Telephone Encounter (Signed)
I would like cardiology input on continuing amiodarone.  I appreciate cards help.

## 2019-10-14 NOTE — Telephone Encounter (Signed)
   Contacted by CHF clinic that pt requested a refill on amiodarone 200mg  daily.  Chart reviewed.  Amiodarone 200mg  PO daily, #90, three refills sent into local [facility] pharmacy.  Murray Hodgkins, NP 10/14/2019, 10:12 AM

## 2019-10-15 NOTE — Telephone Encounter (Signed)
Thanks for letting me know.   Agree with recommendation for fluid restriction <2L, elevating lower extremities when sitting, compression, and daily weights.   Loel Dubonnet, NP

## 2019-10-16 NOTE — Progress Notes (Signed)
Office Visit    Patient Name: Craig Koch Date of Encounter: 10/17/2019  Primary Care Provider:  Tonia Ghent, MD Primary Cardiologist:  Ida Rogue, MD Electrophysiologist:  None   Chief Complaint    Craig Koch is a 64 y.o. 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 Anxiety   . Arthritis   . CHF (congestive heart failure) ([place])   . Coronary artery disease   . Diabetes mellitus   . Dyslipidemia   . Hx of CABG   . Hyperlipidemia   . Hypertension   . Malignant neoplasm of unspecified part of unspecified bronchus or lung ([place]) 08/2018   Immunotherapy  . Pleural effusion    Past Surgical History:  Procedure Laterality Date  . CATARACT EXTRACTION Left 09/2015  . CHEST TUBE INSERTION Left 10/01/2018   Procedure: INSERTION PLEURAL DRAINAGE CATHETER;  Surgeon: [name], MD;  Location: [facility] ORS;  Service: Thoracic;  Laterality: Left;  . CORONARY ARTERY BYPASS GRAFT  2004   (CABG with LIMA to the  LAD, SVG to OM2/OM3, SVG  to diag  . DIALYSIS/PERMA CATHETER INSERTION N/A 03/14/2019   Procedure: DIALYSIS/PERMA CATHETER INSERTION;  Surgeon: Algernon Huxley, MD;  Location: [facility];  Service: Cardiovascular;  Laterality: N/A;  . DIALYSIS/PERMA CATHETER REMOVAL N/A 05/28/2019   Procedure: DIALYSIS/PERMA CATHETER REMOVAL;  Surgeon: Katha Cabal, MD;  Location: [facility];  Service: Cardiovascular;  Laterality: N/A;  . Left ankle surgery     repair of fracture  . Right lower leg surgery     rod  . TEMPORARY DIALYSIS CATHETER N/A 03/11/2019   Procedure: TEMPORARY DIALYSIS CATHETER;  Surgeon: Algernon Huxley, MD;  Location: [facility];  Service: Cardiovascular;  Laterality: N/A;    Allergies  Allergies  Allergen  Reactions  . Promethazine     QT prolongation.   . Trazodone And Nefazodone     QT prolongation.   . Lipitor [Atorvastatin Calcium] Other (See Comments)    Aches.  Tolerated crestor.     History of Present Illness    Craig Koch is a 64 y.o. 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  Per his report also had stress test in 2012 which was normal.  Admitted to [facility] August 2020 with large pleural effusion underwent thoracentesis.  Cytology positive for adenocarcinoma.  Seen in [facility] 02/23/2019 found to be in new onset atrial fibrillation with RVR, AKI requiring temporary HD, NSTEMI, new HFrEF.  Started on amiodarone.  Digoxin was started but discontinued due to renal failure.  High-sensitivity troponin XX 7000 suspicion for graft occlusion.  Echo with moderately reduced LVEF.  Catheterization deferred.  Temporary dialysis catheter placed and removed by the end of admission with tunneled right IJ HD catheter placed.  Since that time HD has been discontinued and HD catheter removed.  He was started on Lasix.  07/29/2019 seen by oncology with PET scan from 07/18/2019 with no evidence of  lung cancer recurrence or metastatic disease though dense airspace disease in RLL concerning for pneumonia or pneumonitis.  He was treated with doxycycline and prednisone as well as nebulizer.  07/29/2019 chest x-ray concerning for worsening mid right and lower lung airspace opacities.  He was started on Levaquin.  When seen in clinic 07/31/2019 his lower extremity edema with increasing Lasix increased to 40 mg daily for 3 to 5 days then dropped back to 40 mg every other day with repeat labs.  Since that time his Lasix has been increased to 40  mg daily.  He was additionally seen by PCP 08/29/2019 and given sliding scale Lasix with Lasix 80 mg daily if SBP less than 100 and if SBP greater than 100 to 120 mg of Lasix daily.  ED visit 10/04/2019 for nosebleed in setting of Plavix and Eliquis use.  Seen by primary care 10/11/2019 for blister of foot.  Presumed due to CHF and low albumin.  Recommended for cleansing with soap and water and Bactroban with nonstick bandages.  Presents today for follow-up with his wife.  Notes over the last 6 weeks he noticed increasing edema in his legs, feet, left arm, as well as stomach.  He notes increasing dyspnea on exertion though carries on conversation without dyspnea today.  He notes lightheadedness upon standing.  Checking BP at home with BP routinely 110s/60s.  Reports weight was maintaining 195 pounds though over the weekend it suddenly increased up to 215 pounds.  He has been taking Lasix 40 mg twice daily.  Takes his first dose around 7 AM and second SR 5 PM.  He gets up to void 3 times per night.  Notes the 2 blisters on his foot have "popped" but are not painful nor producing exudate.  EKGs/Labs/Other Studies Reviewed:   The following studies were reviewed today:  Echo 03/15/19 1. Left ventricular ejection fraction, by estimation, is 35 to 40%. The  left ventricle has moderately decreased function. The left ventricle  demonstrates regional wall motion abnormalities . The left ventricular  internal cavity size was mildly dilated.  There is mild left ventricular hypertrophy. Left ventricular diastolic  parameters are consistent with Grade II diastolic dysfunction  (pseudonormalization). There is moderate hypokinesis of the left  ventricular, entire inferior wall.   2. Right ventricular systolic function is normal. The right ventricular  size is normal. There is moderately elevated pulmonary artery systolic  pressure. The estimated right ventricular systolic pressure is 27.2 mmHg.   3. Left atrial  size was mildly dilated.   4. The mitral valve is normal in structure. Moderate mitral valve  regurgitation. No evidence of mitral stenosis.   5. The aortic valve is abnormal. Aortic valve regurgitation is mild. Mild  to moderate aortic valve sclerosis/calcification is present, without any  evidence of aortic stenosis.   6. The inferior vena cava is normal in size with greater than 50%  respiratory variability, suggesting right atrial pressure of 3 mmHg.   EKG:  EKG is ordered today.  The ekg ordered today demonstrates SR 7 7bmp with occasional PVC, IVCD, prolonged QT (QT 474/QTc 536), and stable prior inferior/anterolateral infarct.  Recent Labs: 08/05/2019: TSH 4.742 08/15/2019: Pro B Natriuretic peptide (BNP) 1,476.0 09/06/2019: Magnesium 1.8 10/04/2019: ALT 65; Brain Natriuretic Peptide 2,647; BUN 58; Creatinine, Ser 1.85; Hemoglobin 10.5; Platelets 199; Potassium 4.2; Sodium 137  Recent Lipid Panel    Component Value Date/Time   CHOL 85 (L) 05/24/2019 1042   TRIG 47 05/24/2019 1042  HDL 41 05/24/2019 1042   CHOLHDL 2.1 05/24/2019 1042   CHOLHDL 9.2 03/04/2019 1059   VLDL 42 (H) 03/04/2019 1059   LDLCALC 32 05/24/2019 1042   LDLDIRECT 31 05/24/2019 1042   LDLDIRECT 84.0 08/18/2015 0850    Risk Assessment/Calculations:  [id number]}  CHA2DS2-VASc Score = 4  This indicates a 4.8% annual risk of stroke. The patient's score is based upon: CHF History: 1 HTN History: 1 Diabetes History: 1 Stroke History: 0 Vascular Disease History: 1 Age Score: 0 Gender Score: 0   Home Medications   Current Meds  Medication Sig  . amiodarone (PACERONE) 200 MG tablet Take 1 tablet (200 mg total) by mouth daily.  [name] apixaban (ELIQUIS) 5 MG TABS tablet Take 1 tablet (5 mg total) by mouth 2 (two) times daily.  [name] bismuth subsalicylate (PEPTO-BISMOL) 262 MG/15ML suspension Take 30 mLs by mouth every 6 (six) hours as needed for diarrhea or loose stools.  . clopidogrel (PLAVIX) 75 MG tablet TAKE 1  TABLET BY MOUTH EVERY DAY  . DULoxetine (CYMBALTA) 30 MG capsule TAKE 1 CAPSULE BY MOUTH EVERY DAY  . ezetimibe (ZETIA) 10 MG tablet Take 1 tablet (10 mg total) by mouth daily.  . ferrous sulfate 325 (65 FE) MG tablet TAKE 1 TABLET (325 MG TOTAL) BY MOUTH 2 (TWO) TIMES DAILY WITH A MEAL.  . folic acid (FOLVITE) 132 MCG tablet Take 400 mcg by mouth daily.  . furosemide (LASIX) 40 MG tablet If lightheaded or SBP <100, then take 80mg  lasix a day.  If not lightheaded and SBP >100, then take 120mg  lasix a day. (Patient taking differently: If lightheaded or SBP <100, then take 80mg  lasix a day.  If not lightheaded and SBP >100, then take 120mg  lasix a day.  09/25/19-taking 1 QD)  . insulin aspart (NOVOLOG) 100 UNIT/ML injection Inject 0-8 Units into the skin as directed. Take 0 units if CBG 70-150 Take 1 units if CBG 151-200 Take 2 units if CBG 201-250 Take 3 units if CBG 251-300 Take 4 units if CBG 301-350 Take 6 units if CBG 351-400 If CBG > 400, give 8 units and call MD  . insulin detemir (LEVEMIR) 100 UNIT/ML injection Inject 0.2 mLs (20 Units total) into the skin at bedtime.  [name] ipratropium-albuterol (DUONEB) 0.5-2.5 (3) MG/3ML SOLN Take 3 mLs by nebulization 4 (four) times daily.  . metoprolol succinate (TOPROL-XL) 25 MG 24 hr tablet Take 0.5 tablets (12.5 mg total) by mouth daily.  . Multiple Vitamin (MULTIVITAMIN WITH MINERALS) TABS tablet Take 1 tablet by mouth at bedtime.  . multivitamin (RENA-VIT) TABS tablet Take 1 tablet by mouth at bedtime.  . mupirocin ointment (BACTROBAN) 2 % Apply 1 application topically 2 (two) times daily. Affected areas  . potassium chloride (KLOR-CON) 10 MEQ tablet TAKE 1 TABLET BY MOUTH EVERY DAY  . promethazine (PHENERGAN) 25 MG tablet Take 25 mg by mouth every 6 (six) hours as needed for nausea or vomiting.  . rosuvastatin (CRESTOR) 10 MG tablet TAKE 1 TABLET (10 MG TOTAL) BY MOUTH DAILY.     Review of Systems      Review of Systems  Constitutional:  Positive for malaise/fatigue. Negative for chills and fever.  Cardiovascular: Positive for dyspnea on exertion and leg swelling. Negative for chest pain, irregular heartbeat, near-syncope, orthopnea, palpitations and syncope.  Respiratory: Negative for cough, shortness of breath and wheezing.   Gastrointestinal: Positive for bloating. Negative for melena, nausea and vomiting.  Genitourinary: Negative for hematuria.  Neurological: Positive for  light-headedness. Negative for dizziness and weakness.   All other systems reviewed and are otherwise negative except as noted above.  Physical Exam    VS:  BP 110/64   Pulse 77   Ht 6' (1.829 m)   Wt 216 lb (98 kg)   BMI 29.29 kg/m  , BMI Body mass index is 29.29 kg/m.  Wt Readings from Last 3 Encounters:  10/17/19 216 lb (98 kg)  10/04/19 210 lb (95.3 kg)  10/04/19 217 lb 2 oz (98.5 kg)     GEN: Well nourished, overweight, well developed, in no acute distress. HEENT: normal. Neck: Supple, no JVD, carotid bruits, or masses. Cardiac: RRR, no murmurs, rubs, or gallops. No clubbing, cyanosis. Bilateral LE 3+ pitting edema.  Radials/DP/PT 2+ and equal bilaterally.  Respiratory:  Respirations regular and unlabored, clear to auscultation bilaterally. GI: Soft, nontender, nondistended.Nonpitting abdominal edema.. MS: No deformity or atrophy. Skin: Warm and dry, no rash. Scattered eccymosis bilateral arms. Bilateral feet with nonadherent dressing and wrap. L forearm with nonadherent dressing and wrap.  Neuro:  Strength and sensation are intact. Psych: Normal affect.  Assessment & Plan    1. Chronic HFrEF (EF 30-35% on 03/2019) - Volume overloaded with 3+ bilateral LE edema, abdominal edema. Weight up 19 pounds from clinic visit 1 month ago. Dry weight approx 195, he weighs 216 today. He is taking Lasix 40mg  BID. Stop Lasix. Start Torsemide 40mg  daily until weight 195 lbs, then 20mg  daily. BMP, CBC today. He will call us Monday to report weight.  Will need repeat labs next week, though hopeful can be collected at nephrology appointment. Appreciative of any recommendations regarding diuresis from nephrology. HF therapies limited by hypotension and renal function.  2. CKD IV / Hypoalbuminemia /secondary hyperparathyroidism-upcoming appointment with Dr. Holley Raring of nephrology next week.  Anticipate hypoalbuminemia is contributory to fluid retention.  Careful monitoring of renal function in the setting of diuretic use.  BMP today.   3. Prolonged QTc -  QTc remains prolonged. Increased today (QTc 536) today compared to prior. Avoid QT prolonging medications. BMP today for reassessment of electrolytes.   4. CAD s/p CABG -stable no anginal symptoms.  GDMT includes beta-blocker, statin.  No aspirin secondary to chronic anticoagulation.  Discontinue Plavix today due to recurrent nosebleeds and difficulty controlling bleeding when he has an [place].   5. PAF on chronic anticoagulation - Maintaining NSR. Denies palpitations. Continue Eliquis 5mg  BID, does not qualify for reduced dose. Continue Amiodarone 200mg  daily.   CHA2DS2-VASc Score = 4 [CHF History: 1, HTN History: 1, Diabetes History: 1, Stroke History: 0, Vascular Disease History: 1, Age Score: 0, Gender Score: 0].  Therefore, the patient's annual risk of stroke is 4.8 %.     6. On amiodarone therapy - Maintaining NSR. No sings of toxicity. Recent transaminitis, as below. TSH and hypothyroidism managed by PCP.  7. COPD with ongoing tobacco use - Continue to follow with pulmonology Dr. Lanney Gins. No signs of acute exacerbation.   8. Stage IV metastatic lung adenocarcinoma - Continue to follow with Dr. Tasia Catchings. Most recent PET with no evidence of lung cancer recurrence or metastatin disease. In remission. CT scan upcoming.   9. Transaminitis - Possibly secondary to immunotherapy treatments. May be contributory to fluid retention. Could consider addition of Spironolactone though likely limited by renal  function.   10. HLD, LDL goal less than 70 - Continue Crestor 10mg  daily.   Disposition: Follow up in 2-3 week(s) with Dr. Rockey Situ or APP via virtual or  in-office visit.   Signed, Loel Dubonnet, NP 10/17/2019, 8:35 AM [place]

## 2019-10-17 ENCOUNTER — Other Ambulatory Visit: Payer: Self-pay

## 2019-10-17 ENCOUNTER — Ambulatory Visit (INDEPENDENT_AMBULATORY_CARE_PROVIDER_SITE_OTHER): Payer: Medicare Other | Admitting: Family

## 2019-10-17 ENCOUNTER — Encounter: Payer: Self-pay | Admitting: Family

## 2019-10-17 VITALS — BP 110/64 | HR 77 | Ht 72.0 in | Wt 216.0 lb

## 2019-10-17 DIAGNOSIS — R7401 Elevation of levels of liver transaminase levels: Secondary | ICD-10-CM

## 2019-10-17 DIAGNOSIS — I5022 Chronic systolic (congestive) heart failure: Secondary | ICD-10-CM | POA: Diagnosis not present

## 2019-10-17 DIAGNOSIS — Z951 Presence of aortocoronary bypass graft: Secondary | ICD-10-CM | POA: Diagnosis not present

## 2019-10-17 DIAGNOSIS — Z7901 Long term (current) use of anticoagulants: Secondary | ICD-10-CM

## 2019-10-17 DIAGNOSIS — I480 Paroxysmal atrial fibrillation: Secondary | ICD-10-CM

## 2019-10-17 DIAGNOSIS — I25118 Atherosclerotic heart disease of native coronary artery with other forms of angina pectoris: Secondary | ICD-10-CM | POA: Diagnosis not present

## 2019-10-17 DIAGNOSIS — E785 Hyperlipidemia, unspecified: Secondary | ICD-10-CM

## 2019-10-17 DIAGNOSIS — Z79899 Other long term (current) drug therapy: Secondary | ICD-10-CM

## 2019-10-17 DIAGNOSIS — R9431 Abnormal electrocardiogram [ECG] [EKG]: Secondary | ICD-10-CM

## 2019-10-17 MED ORDER — TORSEMIDE 20 MG PO TABS: 40.0000 mg | ORAL_TABLET | Freq: Every day | ORAL | 2 refills | Status: DC

## 2019-10-17 NOTE — Patient Instructions (Signed)
Medication Instructions:  Your physician has recommended you make the following change in your medication:   STOP Plavix  CONTINUE Eliquis 5mg  BID  THIS afternoon take Furosemide (Lasix) then discontinue  TOMORROW...  START Torsemide 40mg  (two tablets) once daily  When your weight returns to 195 lbs CHANGE Torsemide to 20mg  (one tablet) daily  *If you need a refill on your cardiac medications before your next appointment, please call your pharmacy*   Lab Work: Your physician recommends that you return for lab work today: BMP, CBC  If you have labs (blood work) drawn today and your tests are completely normal, you will receive your results only by:  [place] (if you have MyChart) OR  A paper copy in the mail If you have any lab test that is abnormal or we need to change your treatment, we will call you to review the results.   Testing/Procedures: Your EKG today shows normal sinus rhythm  Follow-Up: At [facility], you and your health needs are our priority.  As part of our continuing mission to provide you with exceptional heart care, we have created designated Provider Care Teams.  These Care Teams include your primary Cardiologist (physician) and Advanced Practice Providers (APPs -  Physician Assistants and Nurse Practitioners) who all work together to provide you with the care you need, when you need it.  We recommend signing up for the patient portal called "MyChart".  Sign up information is provided on this After Visit Summary.  MyChart is used to connect with patients for Virtual Visits (Telemedicine).  Patients are able to view lab/test results, encounter notes, upcoming appointments, etc.  Non-urgent messages can be sent to your provider as well.   To learn more about what you can do with MyChart, go to [email or web address].    Your next appointment:   3 week(s)  The format for your next appointment:   In Person or Virtual  Provider:   You may see  Ida Rogue, MD or one of the following Advanced Practice Providers on your designated Care Team:    Murray Hodgkins, NP  Christell Faith, PA-C  Marrianne Mood, PA-C  Laurann Montana, NP  Cadence Kathlen Mody, [place]  Other Instructions  Weigh at home daily and keep a log. Call us Monday to report your weights and blood pressures.

## 2019-10-18 ENCOUNTER — Other Ambulatory Visit: Payer: Self-pay | Admitting: Family Medicine

## 2019-10-18 ENCOUNTER — Telehealth: Payer: Self-pay | Admitting: *Deleted

## 2019-10-18 DIAGNOSIS — I5043 Acute on chronic combined systolic (congestive) and diastolic (congestive) heart failure: Secondary | ICD-10-CM | POA: Diagnosis not present

## 2019-10-18 LAB — BASIC METABOLIC PANEL
BUN/Creatinine Ratio: 19 (ref 10–24)
BUN: 40 mg/dL — ABNORMAL HIGH (ref 8–27)
CO2: 20 mmol/L (ref 20–29)
Calcium: 8.5 mg/dL — ABNORMAL LOW (ref 8.6–10.2)
Chloride: 99 mmol/L (ref 96–106)
Creatinine, Ser: 2.08 mg/dL — ABNORMAL HIGH (ref 0.76–1.27)
GFR calc Af Amer: 38 mL/min/{1.73_m2} — ABNORMAL LOW (ref >59)
GFR calc non Af Amer: 33 mL/min/{1.73_m2} — ABNORMAL LOW (ref >59)
Glucose: 129 mg/dL — ABNORMAL HIGH (ref 65–99)
Potassium: 3.6 mmol/L (ref 3.5–5.2)
Sodium: 137 mmol/L (ref 134–144)

## 2019-10-18 LAB — CBC WITH DIFFERENTIAL/PLATELET
Basophils Absolute: 0.1 10*3/uL (ref 0.0–0.2)
Basos: 1 %
EOS (ABSOLUTE): 0.1 10*3/uL (ref 0.0–0.4)
Eos: 2 %
Hematocrit: 29.2 % — ABNORMAL LOW (ref 37.5–51.0)
Hemoglobin: 9.4 g/dL — ABNORMAL LOW (ref 13.0–17.7)
Immature Grans (Abs): 0.1 10*3/uL (ref 0.0–0.1)
Immature Granulocytes: 1 %
Lymphocytes Absolute: 0.7 10*3/uL (ref 0.7–3.1)
Lymphs: 12 %
MCH: 30.7 pg (ref 26.6–33.0)
MCHC: 32.2 g/dL (ref 31.5–35.7)
MCV: 95 fL (ref 79–97)
Monocytes Absolute: 0.7 10*3/uL (ref 0.1–0.9)
Monocytes: 12 %
Neutrophils Absolute: 4.7 10*3/uL (ref 1.4–7.0)
Neutrophils: 72 %
Platelets: 229 10*3/uL (ref 150–450)
RBC: 3.06 x10E6/uL — ABNORMAL LOW (ref 4.14–5.80)
RDW: 18.3 % — ABNORMAL HIGH (ref 11.6–15.4)
WBC: 6.4 10*3/uL (ref 3.4–10.8)

## 2019-10-18 NOTE — Patient Instructions (Addendum)
Visit Information  Goals Addressed            This Visit's Progress   . Pharmacy Care Plan       CARE PLAN ENTRY (see longitudinal plan of care for additional care plan information)  Current Barriers:  . Chronic Disease Management support, education, and care coordination needs related to Diabetes and Atrial Fibrillation   Diabetes Lab Results  Component Value Date/Time   HGBA1C 7.2 (A) 05/14/2019 08:54 AM   HGBA1C 8.8 (H) 03/26/2019 09:55 PM   HGBA1C 11.3 (H) 03/04/2019 10:59 AM   . Pharmacist Clinical Goal(s): o Over the next 90 days, patient will work with PharmD and providers to achieve A1c goal <7% . Current regimen:   Novolog 0-8 units sliding scale before meals   Levemir 100 units/ml 20 units at bedtime . Interventions: o Working to improve access to insulin regimen via patient assistance.  . Patient self care activities - Over the next 90 days, patient will: o Check blood sugar 3-4 times daily, document, and provide at future appointments o Contact provider with any episodes of hypoglycemia o Complete Patient Assistance Application, have provider sign and submit.    Atrial Fibrillation . Pharmacist Clinical Goal(s) o Over the next 90 days, patient will work with PharmD and providers to work on [employer] affordability . Current regimen:   Eliquis 5 mg bid   Amiodarone 200 mg daily . Interventions: o Complete patient assistance application and submit to company for Eliquis.  . Patient self care activities - Over the next 90 days, patient will: o Continue to take medications as prescribed.  o Contact pharmacist or provider with any questions or concerns.   Medication management . Pharmacist Clinical Goal(s): o Over the next 90 days, patient will work with PharmD and providers to maintain optimal medication adherence . Current pharmacy: [facility] . Interventions o Comprehensive medication review performed. o Continue current medication management  strategy . Patient self care activities - Over the next 90 days, patient will: o Focus on medication adherence by continuing to use pill box.  o Take medications as prescribed o Report any questions or concerns to PharmD and/or provider(s)  Please see past updates related to this goal by clicking on the "Past Updates" button in the selected goal         The patient verbalized understanding of instructions provided today and declined a print copy of patient instruction materials.   Telephone follow up appointment with pharmacy team member scheduled for: 12/2019  Sherre Poot, PharmD, [facility] Clinical Pharmacist [facility] [phone] (office) [phone] (mobile)   Chronic Kidney Disease, Adult Chronic kidney disease (CKD) happens when the kidneys are damaged over a long period of time. The kidneys are two organs that help with:  Getting rid of waste and extra fluid from the blood.  Making hormones that maintain the amount of fluid in your tissues and blood vessels.  Making sure that the body has the right amount of fluids and chemicals. Most of the time, CKD does not go away, but it can usually be controlled. Steps must be taken to slow down the kidney damage or to stop it from getting worse. If this is not done, the kidneys may stop working. Follow these instructions at home: Medicines  Take over-the-counter and prescription medicines only as told by your doctor. You may need to change the amount of medicines you take.  Do not take any new medicines unless your doctor says it is okay. Many  medicines can make your kidney damage worse.  Do not take any vitamin and supplements unless your doctor says it is okay. Many vitamins and supplements can make your kidney damage worse. General instructions  Follow a diet as told by your doctor. You may need to stay away from: ? Alcohol. ? Salty foods. ? Foods that are high in:  Potassium.  Calcium.  Protein.  Do  not use any products that contain nicotine or tobacco, such as cigarettes and e-cigarettes. If you need help quitting, ask your doctor.  Keep track of your blood pressure at home. Tell your doctor about any changes.  If you have diabetes, keep track of your blood sugar as told by your doctor.  Try to stay at a healthy weight. If you need help, ask your doctor.  Exercise at least 30 minutes a day, 5 days a week.  Stay up-to-date with your shots (immunizations) as told by your doctor.  Keep all follow-up visits as told by your doctor. This is important. Contact a doctor if:  Your symptoms get worse.  You have new symptoms. Get help right away if:  You have symptoms of end-stage kidney disease. These may include: ? Headaches. ? Numbness in your hands or feet. ? Easy bruising. ? Having hiccups often. ? Chest pain. ? Shortness of breath. ? Stopping of menstrual periods in women.  You have a fever.  You have very little pee (urine).  You have pain or bleeding when you pee. Summary  Chronic kidney disease (CKD) happens when the kidneys are damaged over a long period of time.  Most of the time, this condition does not go away, but it can usually be controlled. Steps must be taken to slow down the kidney damage or to stop it from getting worse.  Treatment may include a combination of medicines and lifestyle changes. This information is not intended to replace advice given to you by your health care provider. Make sure you discuss any questions you have with your health care provider. Document Revised: 12/02/2016 Document Reviewed: 01/25/2016 Elsevier Patient Education  2020 [employer].

## 2019-10-18 NOTE — Telephone Encounter (Signed)
-----   Message from Loel Dubonnet, NP sent at 10/18/2019 11:46 AM EDT ----- Kidney function decreased compared to previous, likely due to volume overload. Continue with plan to switch to Torsemide as discussed in clinic visit 10/17/19. CBC shows worsening anemia which also can be contributory to fluid retention. Follow up with nephrology and oncology next week as scheduled.  Recommend repeat BMP in 1 week. Please order for [facility] 10/21 or 10/22 - if his nephrologist collects at clinic visit, please have him call us to let us know so we can look for result.

## 2019-10-18 NOTE — Telephone Encounter (Signed)
Left voicemail message to call back  

## 2019-10-21 ENCOUNTER — Emergency Department: Payer: Medicare Other

## 2019-10-21 ENCOUNTER — Encounter: Payer: Self-pay | Admitting: Emergency Medicine

## 2019-10-21 ENCOUNTER — Other Ambulatory Visit: Payer: Self-pay

## 2019-10-21 ENCOUNTER — Inpatient Hospital Stay
Admission: EM | Admit: 2019-10-21 | Discharge: 2019-10-28 | DRG: 291 | Disposition: A | Discharge status: Home / Self Care | Payer: Medicare Other | Attending: Internal Medicine | Admitting: Internal Medicine

## 2019-10-21 DIAGNOSIS — S0990XA Unspecified injury of head, initial encounter: Secondary | ICD-10-CM | POA: Diagnosis not present

## 2019-10-21 DIAGNOSIS — Z951 Presence of aortocoronary bypass graft: Secondary | ICD-10-CM

## 2019-10-21 DIAGNOSIS — N1832 Chronic kidney disease, stage 3b: Secondary | ICD-10-CM | POA: Diagnosis not present

## 2019-10-21 DIAGNOSIS — Z79899 Other long term (current) drug therapy: Secondary | ICD-10-CM

## 2019-10-21 DIAGNOSIS — I10 Essential (primary) hypertension: Secondary | ICD-10-CM | POA: Diagnosis not present

## 2019-10-21 DIAGNOSIS — D631 Anemia in chronic kidney disease: Secondary | ICD-10-CM | POA: Diagnosis present

## 2019-10-21 DIAGNOSIS — S90822A Blister (nonthermal), left foot, initial encounter: Secondary | ICD-10-CM | POA: Diagnosis present

## 2019-10-21 DIAGNOSIS — N179 Acute kidney failure, unspecified: Secondary | ICD-10-CM | POA: Diagnosis not present

## 2019-10-21 DIAGNOSIS — I480 Paroxysmal atrial fibrillation: Secondary | ICD-10-CM | POA: Diagnosis not present

## 2019-10-21 DIAGNOSIS — I272 Pulmonary hypertension, unspecified: Secondary | ICD-10-CM | POA: Diagnosis present

## 2019-10-21 DIAGNOSIS — I130 Hypertensive heart and chronic kidney disease with heart failure and stage 1 through stage 4 chronic kidney disease, or unspecified chronic kidney disease: Principal | ICD-10-CM | POA: Diagnosis present

## 2019-10-21 DIAGNOSIS — S0121XA Laceration without foreign body of nose, initial encounter: Secondary | ICD-10-CM | POA: Diagnosis present

## 2019-10-21 DIAGNOSIS — Z85118 Personal history of other malignant neoplasm of bronchus and lung: Secondary | ICD-10-CM

## 2019-10-21 DIAGNOSIS — I5043 Acute on chronic combined systolic (congestive) and diastolic (congestive) heart failure: Secondary | ICD-10-CM | POA: Diagnosis not present

## 2019-10-21 DIAGNOSIS — M7989 Other specified soft tissue disorders: Secondary | ICD-10-CM | POA: Diagnosis present

## 2019-10-21 DIAGNOSIS — R601 Generalized edema: Secondary | ICD-10-CM

## 2019-10-21 DIAGNOSIS — S51011A Laceration without foreign body of right elbow, initial encounter: Secondary | ICD-10-CM | POA: Diagnosis not present

## 2019-10-21 DIAGNOSIS — E11649 Type 2 diabetes mellitus with hypoglycemia without coma: Secondary | ICD-10-CM | POA: Diagnosis not present

## 2019-10-21 DIAGNOSIS — E876 Hypokalemia: Secondary | ICD-10-CM | POA: Diagnosis not present

## 2019-10-21 DIAGNOSIS — S90821A Blister (nonthermal), right foot, initial encounter: Secondary | ICD-10-CM | POA: Diagnosis present

## 2019-10-21 DIAGNOSIS — S199XXA Unspecified injury of neck, initial encounter: Secondary | ICD-10-CM | POA: Diagnosis not present

## 2019-10-21 DIAGNOSIS — Z7901 Long term (current) use of anticoagulants: Secondary | ICD-10-CM

## 2019-10-21 DIAGNOSIS — J90 Pleural effusion, not elsewhere classified: Secondary | ICD-10-CM | POA: Diagnosis not present

## 2019-10-21 DIAGNOSIS — M19011 Primary osteoarthritis, right shoulder: Secondary | ICD-10-CM | POA: Diagnosis not present

## 2019-10-21 DIAGNOSIS — I2510 Atherosclerotic heart disease of native coronary artery without angina pectoris: Secondary | ICD-10-CM | POA: Diagnosis present

## 2019-10-21 DIAGNOSIS — F419 Anxiety disorder, unspecified: Secondary | ICD-10-CM | POA: Diagnosis present

## 2019-10-21 DIAGNOSIS — J449 Chronic obstructive pulmonary disease, unspecified: Secondary | ICD-10-CM | POA: Diagnosis not present

## 2019-10-21 DIAGNOSIS — Y92009 Unspecified place in unspecified non-institutional (private) residence as the place of occurrence of the external cause: Secondary | ICD-10-CM

## 2019-10-21 DIAGNOSIS — E1122 Type 2 diabetes mellitus with diabetic chronic kidney disease: Secondary | ICD-10-CM | POA: Diagnosis not present

## 2019-10-21 DIAGNOSIS — Z794 Long term (current) use of insulin: Secondary | ICD-10-CM

## 2019-10-21 DIAGNOSIS — M47812 Spondylosis without myelopathy or radiculopathy, cervical region: Secondary | ICD-10-CM | POA: Diagnosis not present

## 2019-10-21 DIAGNOSIS — E785 Hyperlipidemia, unspecified: Secondary | ICD-10-CM | POA: Diagnosis present

## 2019-10-21 DIAGNOSIS — W010XXA Fall on same level from slipping, tripping and stumbling without subsequent striking against object, initial encounter: Secondary | ICD-10-CM | POA: Diagnosis present

## 2019-10-21 DIAGNOSIS — F17210 Nicotine dependence, cigarettes, uncomplicated: Secondary | ICD-10-CM | POA: Diagnosis present

## 2019-10-21 DIAGNOSIS — N2581 Secondary hyperparathyroidism of renal origin: Secondary | ICD-10-CM | POA: Diagnosis not present

## 2019-10-21 DIAGNOSIS — Z888 Allergy status to other drugs, medicaments and biological substances status: Secondary | ICD-10-CM

## 2019-10-21 DIAGNOSIS — E118 Type 2 diabetes mellitus with unspecified complications: Secondary | ICD-10-CM | POA: Diagnosis present

## 2019-10-21 DIAGNOSIS — Z8249 Family history of ischemic heart disease and other diseases of the circulatory system: Secondary | ICD-10-CM

## 2019-10-21 DIAGNOSIS — Z20822 Contact with and (suspected) exposure to covid-19: Secondary | ICD-10-CM | POA: Diagnosis present

## 2019-10-21 DIAGNOSIS — C3490 Malignant neoplasm of unspecified part of unspecified bronchus or lung: Secondary | ICD-10-CM

## 2019-10-21 DIAGNOSIS — R600 Localized edema: Secondary | ICD-10-CM | POA: Diagnosis not present

## 2019-10-21 DIAGNOSIS — I1310 Hypertensive heart and chronic kidney disease without heart failure, with stage 1 through stage 4 chronic kidney disease, or unspecified chronic kidney disease: Secondary | ICD-10-CM

## 2019-10-21 LAB — CBC
HCT: 31.2 % — ABNORMAL LOW (ref 39.0–52.0)
Hemoglobin: 10 g/dL — ABNORMAL LOW (ref 13.0–17.0)
MCH: 31.8 pg (ref 26.0–34.0)
MCHC: 32.1 g/dL (ref 30.0–36.0)
MCV: 99.4 fL (ref 80.0–100.0)
Platelets: 303 10*3/uL (ref 150–400)
RBC: 3.14 MIL/uL — ABNORMAL LOW (ref 4.22–5.81)
RDW: 23.9 % — ABNORMAL HIGH (ref 11.5–15.5)
WBC: 7.5 10*3/uL (ref 4.0–10.5)
nRBC: 0 % (ref 0.0–0.2)

## 2019-10-21 LAB — BASIC METABOLIC PANEL
Anion gap: 12 (ref 5–15)
BUN: 39 mg/dL — ABNORMAL HIGH (ref 8–23)
CO2: 28 mmol/L (ref 22–32)
Calcium: 8 mg/dL — ABNORMAL LOW (ref 8.9–10.3)
Chloride: 99 mmol/L (ref 98–111)
Creatinine, Ser: 2.01 mg/dL — ABNORMAL HIGH (ref 0.61–1.24)
GFR, Estimated: 34 mL/min — ABNORMAL LOW (ref >60)
Glucose, Bld: 149 mg/dL — ABNORMAL HIGH (ref 70–99)
Potassium: 2.8 mmol/L — ABNORMAL LOW (ref 3.5–5.1)
Sodium: 139 mmol/L (ref 135–145)

## 2019-10-21 LAB — HEPATIC FUNCTION PANEL
ALT: 39 U/L (ref 0–44)
AST: 58 U/L — ABNORMAL HIGH (ref 15–41)
Albumin: 2.4 g/dL — ABNORMAL LOW (ref 3.5–5.0)
Alkaline Phosphatase: 321 U/L — ABNORMAL HIGH (ref 38–126)
Bilirubin, Direct: 0.5 mg/dL — ABNORMAL HIGH (ref 0.0–0.2)
Indirect Bilirubin: 0.6 mg/dL (ref 0.3–0.9)
Total Bilirubin: 1.1 mg/dL (ref 0.3–1.2)
Total Protein: 6.3 g/dL — ABNORMAL LOW (ref 6.5–8.1)

## 2019-10-21 LAB — RESPIRATORY PANEL BY RT PCR (FLU A&B, COVID)
Influenza A by PCR: NEGATIVE
Influenza B by PCR: NEGATIVE
SARS Coronavirus 2 by RT PCR: NEGATIVE

## 2019-10-21 LAB — BRAIN NATRIURETIC PEPTIDE: B Natriuretic Peptide: 2099 pg/mL — ABNORMAL HIGH (ref 0.0–100.0)

## 2019-10-21 MED ORDER — ACETAMINOPHEN 500 MG PO TABS
1000.0000 mg | ORAL_TABLET | Freq: Once | ORAL | Status: AC
  Administered 2019-10-21: 1000 mg via ORAL
  Filled 2019-10-21: qty 2

## 2019-10-21 MED ORDER — FUROSEMIDE 10 MG/ML IJ SOLN
80.0000 mg | Freq: Once | INTRAMUSCULAR | Status: AC
  Administered 2019-10-21: 80 mg via INTRAVENOUS
  Filled 2019-10-21: qty 8

## 2019-10-21 MED ORDER — POTASSIUM CHLORIDE 10 MEQ/100ML IV SOLN
10.0000 meq | Freq: Once | INTRAVENOUS | Status: AC
  Administered 2019-10-21: 10 meq via INTRAVENOUS
  Filled 2019-10-21: qty 100

## 2019-10-21 MED ORDER — POTASSIUM CHLORIDE CRYS ER 20 MEQ PO TBCR
40.0000 meq | EXTENDED_RELEASE_TABLET | Freq: Once | ORAL | Status: AC
  Administered 2019-10-21: 40 meq via ORAL
  Filled 2019-10-21: qty 2

## 2019-10-21 NOTE — ED Triage Notes (Signed)
Pt to triage states was seen at Dr. Elwyn Lade today and told he had gained 30lbs since last visit.  Pt with notable swelling to bilateral legs to thigh level. Pt was advised to come to ER for evaluation and possible admission.  Pt went home to gather things prior to coming here and fell, striking head, denies LOC.  Small laceration to nose with controlled bleeding.  Pt also states bilateral arms are tingling since the fall.

## 2019-10-21 NOTE — Telephone Encounter (Signed)
Craig Koch, please schedule labs on 10/22, before coming up to see Dr. Tasia Catchings. Please call pt to inform him that he will need to be here earlier for labs.

## 2019-10-21 NOTE — Telephone Encounter (Signed)
Reviewed results and recommendations with patient. He does have appointment on Friday with Dr. Tasia Catchings and he states they always do labs. Advised that I would send message to see if they can order lab to be done at that visit. He did weigh over the weekend but did not see any change. Inquired if he had those numbers and he didn't write them down. Advised that writing down those readings it would be helpful to [place] to keep track of his changes with any swelling. He verbalized understanding and had no further questions at this time. Will send Dr. Tasia Catchings request for labs to be done at that visit because he stated that they always check them. He verbalized understanding of our conversation, agreement with plan, and had no further questions at this time.

## 2019-10-21 NOTE — Telephone Encounter (Signed)
Please add lab encounter with MD appt on 10/22

## 2019-10-21 NOTE — Telephone Encounter (Signed)
Please arrange him to get labs. Cbc cmp thanks.

## 2019-10-21 NOTE — Telephone Encounter (Signed)
Patient returning call.

## 2019-10-21 NOTE — ED Notes (Signed)
PA Gaines to triage to evaluate laceration to pt's nose.  Pressure held, bleeding controled, dermabond applied.  No further bleeding noted at this time.  Verbal orders for CTs from PA Hyde due to patient fall and c/o related to same.  Dressing on left elbow reinforced as it has bled through at this time.

## 2019-10-21 NOTE — ED Provider Notes (Signed)
[facility] Emergency Department Provider Note ____________________________________________   First MD Initiated Contact with Patient 10/21/19 2021     (approximate)  I have reviewed the triage vital signs and the nursing notes.  HISTORY  Chief Complaint Leg Swelling   HPI Craig Koch is a 64 y.o. malewho presents to the ED for evaluation of leg swelling.   Chart review indicates patient follows with Dr. Holley Raring with [facility] for known CKD stage IIIb. DM on insulin, HTN, HLD, CHF on torsemide.  Eliquis.   Followed up with nephrology as an outpatient this morning and noted to have worsening evidence of volume overload, concern for cardiorenal syndrome.  Directed to the ED for further evaluation.  Patient reports subacute increase to his swelling over the past few weeks to his bilateral lower extremities up to his mid thighs.  Reports associated shortness of breath, orthopnea.  Denies cough, fever, productive cough, abdominal pain, vomiting or diarrhea.  Reports compliance with his torsemide as prescribed by cardiology.  Reports generalized weakness this afternoon such that he tripped and fell on his way back into the house from the step up from his deck outside.  Denies syncope.  Reports head trauma with small laceration to the bridge of his nose.  Separate PA provider repaired nasal bridge laceration, as documented separately.  Past Medical History:  Diagnosis Date  . Anxiety   . Arthritis   . CHF (congestive heart failure) ([place])   . Coronary artery disease   . Diabetes mellitus   . Dyslipidemia   . Hx of CABG   . Hyperlipidemia   . Hypertension   . Malignant neoplasm of unspecified part of unspecified bronchus or lung ([place]) 08/2018   Immunotherapy  . Pleural effusion     Patient Active Problem List   Diagnosis Date Noted  . Blister of foot 10/11/2019  . Skin tear of hand without complication 27/03/5007  . Epistaxis  10/04/2019  . Stage 4 chronic kidney disease ([place]) 09/25/2019  . Alkaline phosphatase elevation 09/25/2019  . Confusion 09/25/2019  . Hallucination 08/19/2019  . Encounter for antineoplastic immunotherapy 04/17/2019  . Fatigue 04/17/2019  . Weight loss 04/17/2019  . HCAP (healthcare-associated pneumonia) 03/26/2019  . Hypoxemia 03/26/2019  . ESRD on hemodialysis ([place]) 03/26/2019  . Hx of CABG 03/26/2019  . AF (paroxysmal atrial fibrillation) ([place]) 03/26/2019  . 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 S/P thoracentesis   . Pleural effusion   . Acute systolic heart failure ([place])   . Pain of right upper extremity   . Microcytic anemia   . Protein-calorie malnutrition, severe 03/06/2019  . Acute alteration in mental status   . Palliative care encounter   . Atrial fibrillation with RVR ([place]) 03/03/2019  . DKA (diabetic ketoacidoses) 03/03/2019  . Hypothermia 03/03/2019  . SOB (shortness of breath) 03/03/2019  . Acute renal failure ([place]) 03/03/2019  . NSTEMI (non-ST elevated myocardial infarction) ([place]) 03/03/2019  . Hyponatremia 02/08/2019  . Uncontrolled type 2 diabetes mellitus with hyperglycemia ([place]) 02/08/2019  . Pedal edema 02/08/2019  . Encounter for antineoplastic chemotherapy 12/08/2018  . Tobacco abuse 10/12/2018  . Administrative encounter 09/23/2018  . Malignant neoplasm of unspecified part of unspecified bronchus or lung ([place]) 09/08/2018  . Goals of care, counseling/discussion 09/08/2018  . Recurrent pleural effusion on left 09/02/2018  . Elevated troponin 08/24/2018  . Medicare annual wellness visit, subsequent 08/07/2018  . Lower urinary tract symptoms (LUTS)  08/07/2018  . Neck pain 08/07/2018  . CAD (coronary artery disease), native coronary artery 09/23/2017  . Smoker 09/23/2017  . Chronic joint pain 10/18/2016  . Healthcare maintenance 07/17/2016  . Advance care planning 07/17/2016  .  Fungal rash of torso 05/06/2016  . Trigger finger 05/06/2016  . History of alcohol use 11/17/2015  . Skin tear of forearm without complication 52/77/8242  . Anxiety state 02/19/2015  . Hand weakness 05/23/2011  . Diabetes mellitus with complication ([place]) 35/36/1443  . HLD (hyperlipidemia) 03/05/2010  . Essential hypertension 03/05/2010  . Coronary atherosclerosis 03/05/2010    Past Surgical History:  Procedure Laterality Date  . CATARACT EXTRACTION Left 09/2015  . CHEST TUBE INSERTION Left 10/01/2018   Procedure: INSERTION PLEURAL DRAINAGE CATHETER;  Surgeon: [name], MD;  Location: [facility] ORS;  Service: Thoracic;  Laterality: Left;  . CORONARY ARTERY BYPASS GRAFT  2004   (CABG with LIMA to the  LAD, SVG to OM2/OM3, SVG  to diag  . DIALYSIS/PERMA CATHETER INSERTION N/A 03/14/2019   Procedure: DIALYSIS/PERMA CATHETER INSERTION;  Surgeon: Algernon Huxley, MD;  Location: [place] [facility];  Service: Cardiovascular;  Laterality: N/A;  . DIALYSIS/PERMA CATHETER REMOVAL N/A 05/28/2019   Procedure: DIALYSIS/PERMA CATHETER REMOVAL;  Surgeon: Katha Cabal, MD;  Location: [place] [facility];  Service: Cardiovascular;  Laterality: N/A;  . Left ankle surgery     repair of fracture  . Right lower leg surgery     rod  . TEMPORARY DIALYSIS CATHETER N/A 03/11/2019   Procedure: TEMPORARY DIALYSIS CATHETER;  Surgeon: Algernon Huxley, MD;  Location: [place] [facility];  Service: Cardiovascular;  Laterality: N/A;    Prior to Admission medications   Medication Sig Start Date End Date Taking? Authorizing Provider  albuterol (VENTOLIN HFA) 108 (90 Base) MCG/ACT inhaler Inhale 2 puffs into the lungs every 6 (six) hours as needed for wheezing or shortness of breath. 04/01/19 10/04/19  Wyvonnia Dusky, MD  amiodarone (PACERONE) 200 MG tablet Take 1 tablet (200 mg total) by mouth daily. 10/14/19   Theora Gianotti, NP  apixaban (ELIQUIS) 5 MG TABS tablet Take 1 tablet (5 mg total) by mouth 2  (two) times daily. 05/10/19   Marrianne Mood D, PA-C  bismuth subsalicylate (PEPTO-BISMOL) 262 MG/15ML suspension Take 30 mLs by mouth every 6 (six) hours as needed for diarrhea or loose stools. 08/30/19   Tonia Ghent, MD  DULoxetine (CYMBALTA) 30 MG capsule TAKE 1 CAPSULE BY MOUTH EVERY DAY 09/16/19   Tonia Ghent, MD  ezetimibe (ZETIA) 10 MG tablet Take 1 tablet (10 mg total) by mouth daily. 05/10/19 09/16/28  Marrianne Mood D, PA-C  ferrous sulfate 325 (65 FE) MG tablet TAKE 1 TABLET (325 MG TOTAL) BY MOUTH 2 (TWO) TIMES DAILY WITH A MEAL. 10/11/19 11/10/19  Tonia Ghent, MD  folic acid (FOLVITE) 154 MCG tablet Take 400 mcg by mouth daily.    [provider]  insulin aspart (NOVOLOG) 100 UNIT/ML injection Inject 0-8 Units into the skin as directed. Take 0 units if CBG 70-150 Take 1 units if CBG 151-200 Take 2 units if CBG 201-250 Take 3 units if CBG 251-300 Take 4 units if CBG 301-350 Take 6 units if CBG 351-400 If CBG > 400, give 8 units and call MD 04/15/19   Tonia Ghent, MD  insulin detemir (LEVEMIR) 100 UNIT/ML injection Inject 0.2 mLs (20 Units total) into the skin at bedtime. 06/14/19   Tonia Ghent, MD  ipratropium-albuterol (DUONEB) 0.5-2.5 (3) MG/3ML SOLN Take 3 mLs by nebulization 4 (four) times daily. 06/12/19   [provider]  metoprolol succinate (TOPROL-XL) 25 MG 24 hr tablet TAKE 1/2 TABLET BY MOUTH EVERY DAY 10/18/19   Tonia Ghent, MD  Multiple Vitamin (MULTIVITAMIN WITH MINERALS) TABS tablet Take 1 tablet by mouth at bedtime.    [provider]  multivitamin (RENA-VIT) TABS tablet Take 1 tablet by mouth at bedtime. 03/19/19   Ezekiel Slocumb, DO  mupirocin ointment (BACTROBAN) 2 % Apply 1 application topically 2 (two) times daily. Affected areas 10/11/19   Tower, Wynelle Fanny, MD  potassium chloride (KLOR-CON) 10 MEQ tablet TAKE 1 TABLET BY MOUTH EVERY DAY 10/10/19   Tonia Ghent, MD  promethazine (PHENERGAN) 25 MG tablet Take 25  mg by mouth every 6 (six) hours as needed for nausea or vomiting.    [provider]  rosuvastatin (CRESTOR) 10 MG tablet TAKE 1 TABLET (10 MG TOTAL) BY MOUTH DAILY. 01/07/19   Tonia Ghent, MD  torsemide (DEMADEX) 20 MG tablet Take 2 tablets (40 mg total) by mouth daily. 10/17/19   Loel Dubonnet, NP    Allergies Promethazine, Trazodone and nefazodone, and Lipitor [atorvastatin calcium]  Family History  Problem Relation Age of Onset  . Dementia Mother   . Heart disease Father   . Colon cancer Neg Hx   . Prostate cancer Neg Hx   . Diabetes Neg Hx     Social History Social History   Tobacco Use  . Smoking status: Current Every Day Smoker    Packs/day: 0.20    Years: 45.00    Pack years: 9.00    Types: Cigarettes    Last attempt to quit: 03/05/2019    Years since quitting: 0.6  . Smokeless tobacco: Former Systems developer    Types: Snuff  Vaping Use  . Vaping Use: Never used  Substance Use Topics  . Alcohol use: Not Currently    Alcohol/week: 6.0 standard drinks    Types: 6 Cans of beer per week    Comment: occ, average 6 pack in a week  . Drug use: No    Review of Systems  Constitutional: No fever/chills.  Positive generalized weakness. Eyes: No visual changes. ENT: No sore throat. Cardiovascular: Denies chest pain. Respiratory: Positive shortness of breath and orthopnea. Gastrointestinal: No abdominal pain.  No nausea, no vomiting.  No diarrhea.  No constipation. Genitourinary: Negative for dysuria. Musculoskeletal: Negative for back pain. Skin: Negative for rash. Neurological: Negative for headaches, focal weakness or numbness.  ____________________________________________   PHYSICAL EXAM:  VITAL SIGNS: Vitals:   10/21/19 2200 10/21/19 2300  BP: 117/67 122/72  Pulse: 82 84  Resp: 18 20  Temp:    SpO2: 96% 99%      Constitutional: Alert and oriented. Well appearing and in no acute distress.  Pleasant and conversational full sentences. Eyes:  Conjunctivae are normal. PERRL. EOMI. Head: Repaired laceration to the bridge of his nose.  No evidence of nasal septal hematoma.  No periorbital step-offs or evidence of EOM entrapment. Nose: No congestion/rhinnorhea. Mouth/Throat: Mucous membranes are moist.  Oropharynx non-erythematous. Neck: No stridor. No cervical spine tenderness to palpation. Cardiovascular: Normal rate, regular rhythm. Grossly normal heart sounds.  Good peripheral circulation. Respiratory: Mild tachypnea to the low 20s, otherwise no evidence of distress..  No retractions.  Bibasilar crackles, otherwise clear. Gastrointestinal: Soft , nondistended, nontender to palpation. No abdominal bruits. No CVA tenderness. Musculoskeletal: Skin tear to his  right elbow.  Active and passive ROM of his right elbow intact without pain.  No discrete laceration or evidence of bony injury. Pitting edema from feet up to mid thighs.  No overlying skin changes to suggest cellulitis. Neurologic:  Normal speech and language. No gross focal neurologic deficits are appreciated. No gait instability noted. Skin:  Skin is warm, dry and intact. No rash noted. Psychiatric: Mood and affect are normal. Speech and behavior are normal.  ____________________________________________   LABS (all labs ordered are listed, but only abnormal results are displayed)  Labs Reviewed  BASIC METABOLIC PANEL - Abnormal; Notable for the following components:      Result Value   Potassium 2.8 (*)    Glucose, Bld 149 (*)    BUN 39 (*)    Creatinine, Ser 2.01 (*)    Calcium 8.0 (*)    GFR, Estimated 34 (*)    All other components within normal limits  CBC - Abnormal; Notable for the following components:   RBC 3.14 (*)    Hemoglobin 10.0 (*)    HCT 31.2 (*)    RDW 23.9 (*)    All other components within normal limits  BRAIN NATRIURETIC PEPTIDE - Abnormal; Notable for the following components:   B Natriuretic Peptide 2,099.0 (*)    All other components within  normal limits  HEPATIC FUNCTION PANEL - Abnormal; Notable for the following components:   Total Protein 6.3 (*)    Albumin 2.4 (*)    AST 58 (*)    Alkaline Phosphatase 321 (*)    Bilirubin, Direct 0.5 (*)    All other components within normal limits  RESPIRATORY PANEL BY RT PCR (FLU A&B, COVID)   ____________________________________________  12 Lead EKG  Sinus rhythm, rate of 87 bpm.  Rightward axis.  Partial LBBB morphology QTC of 515 ms, otherwise normal intervals.  No evidence of acute ischemia. ____________________________________________  RADIOLOGY  ED MD interpretation: 2 view CXR with bilateral pleural effusions, pulmonary vascular congestion and cardiomegaly without discrete lobar filtration.  Official radiology report(s): DG Chest 2 View  Result Date: 10/21/2019 CLINICAL DATA:  Bilateral lower extremity swelling and weight gain. EXAM: CHEST - 2 VIEW COMPARISON:  July 29, 2019 FINDINGS: Multiple sternal wires and vascular clips are present. Mild, diffusely increased interstitial lung markings are noted. Mild to moderate severity infiltrates are seen within the mid and lower left lung. This is mildly increased in severity when compared to the prior study. The infiltrates seen within the right lung on the prior study are markedly decreased in severity when compared to the prior exam, with a very small amount of residual right basilar atelectasis and/or infiltrate seen. Small, stable bilateral pleural effusions are present. No pneumothorax is identified. The cardiac silhouette is mildly enlarged. The visualized skeletal structures are unremarkable. IMPRESSION: 1. Mild to moderate severity left lower lobe infiltrate, mildly increased in severity when compared to the prior study. 2. Very mild residual right basilar atelectasis and/or infiltrate. 3. Small, stable bilateral pleural effusions. Electronically Signed   By: Virgina Norfolk M.D.   On: 10/21/2019 21:11   DG Shoulder  Right  Result Date: 10/21/2019 CLINICAL DATA:  Recent fall with shoulder pain, initial encounter EXAM: RIGHT SHOULDER - 2+ VIEW COMPARISON:  None. FINDINGS: Degenerative changes of the acromioclavicular joint are seen. No acute fracture or dislocation is noted. No soft tissue abnormality is seen. Ribcage is within normal limits. IMPRESSION: Degenerative change without acute abnormality. Electronically Signed   By: Elta Gu[place]  Lukens M.D.   On: 10/21/2019 22:45   CT Head Wo Contrast  Result Date: 10/21/2019 CLINICAL DATA:  Fall.  Head injury.  History of lung cancer. EXAM: CT HEAD WITHOUT CONTRAST CT CERVICAL SPINE WITHOUT CONTRAST TECHNIQUE: Multidetector CT imaging of the head and cervical spine was performed following the standard protocol without intravenous contrast. Multiplanar CT image reconstructions of the cervical spine were also generated. COMPARISON:  CT head 03/05/2019 FINDINGS: CT HEAD FINDINGS Brain: Generalized atrophy unchanged. Mild white matter hypodensity bilaterally unchanged. Negative for acute infarct, hemorrhage, mass. Vascular: Negative for hyperdense vessel Skull: Negative Sinuses/Orbits: Paranasal sinuses clear.  Left cataract extraction. Other: None CT CERVICAL SPINE FINDINGS Alignment: 3 mm anterolisthesis C3-4 and C4-5 Skull base and vertebrae: Negative for fracture Soft tissues and spinal canal: Negative for mass or adenopathy in the neck. Atherosclerotic calcification carotid bifurcation bilaterally. Disc levels: Multilevel disc and facet degeneration throughout the cervical spine. Multilevel spinal stenosis throughout the cervical spine most prominent C5-6 and C6-7. Upper chest: Moderately large right pleural effusion and small left pleural effusion. Apical emphysema. Other: None IMPRESSION: 1. No acute intracranial abnormality 2. Negative for cervical spine fracture.  Cervical spondylosis 3. Moderately large right pleural effusion and mild left pleural effusion. Electronically  Signed   By: Franchot Gallo M.D.   On: 10/21/2019 17:59   CT Cervical Spine Wo Contrast  Result Date: 10/21/2019 CLINICAL DATA:  Fall.  Head injury.  History of lung cancer. EXAM: CT HEAD WITHOUT CONTRAST CT CERVICAL SPINE WITHOUT CONTRAST TECHNIQUE: Multidetector CT imaging of the head and cervical spine was performed following the standard protocol without intravenous contrast. Multiplanar CT image reconstructions of the cervical spine were also generated. COMPARISON:  CT head 03/05/2019 FINDINGS: CT HEAD FINDINGS Brain: Generalized atrophy unchanged. Mild white matter hypodensity bilaterally unchanged. Negative for acute infarct, hemorrhage, mass. Vascular: Negative for hyperdense vessel Skull: Negative Sinuses/Orbits: Paranasal sinuses clear.  Left cataract extraction. Other: None CT CERVICAL SPINE FINDINGS Alignment: 3 mm anterolisthesis C3-4 and C4-5 Skull base and vertebrae: Negative for fracture Soft tissues and spinal canal: Negative for mass or adenopathy in the neck. Atherosclerotic calcification carotid bifurcation bilaterally. Disc levels: Multilevel disc and facet degeneration throughout the cervical spine. Multilevel spinal stenosis throughout the cervical spine most prominent C5-6 and C6-7. Upper chest: Moderately large right pleural effusion and small left pleural effusion. Apical emphysema. Other: None IMPRESSION: 1. No acute intracranial abnormality 2. Negative for cervical spine fracture.  Cervical spondylosis 3. Moderately large right pleural effusion and mild left pleural effusion. Electronically Signed   By: Franchot Gallo M.D.   On: 10/21/2019 17:59    ____________________________________________   PROCEDURES and INTERVENTIONS  Procedure(s) performed (including Critical Care):  .1-3 Lead EKG Interpretation Performed by: Vladimir Crofts, MD Authorized by: Vladimir Crofts, MD     Interpretation: normal     ECG rate:  82   ECG rate assessment: normal     Rhythm: sinus rhythm      Ectopy: none     Conduction: normal      Medications  potassium chloride SA (KLOR-CON) CR tablet 40 mEq (has no administration in time range)  furosemide (LASIX) injection 80 mg (has no administration in time range)  potassium chloride 10 mEq in 100 mL IVPB (has no administration in time range)  potassium chloride SA (KLOR-CON) CR tablet 40 mEq (40 mEq Oral Given 10/21/19 2240)  acetaminophen (TYLENOL) tablet 1,000 mg (1,000 mg Oral Given 10/21/19 2240)    ____________________________________________   MDM /  ED COURSE  64 year old man with known history of CKD 3 and CHF presents with anasarca, orthopnea and worsening renal function consistent with cardiorenal syndrome requiring medical admission for diuresis.  Normal vital signs on room air.  Exam demonstrates anasarca and mild tachypnea, patient preferring to sit upright due to orthopnea.  No distress.  CXR demonstrates increasing volumes of pulmonary vascular congestion and bilateral pleural effusions without discrete infiltration to suggest bacterial pneumonia.  Blood work demonstrates worsening renal function, elevated BNP consistent with cardiorenal syndrome.  Replete electrolytes orally and IV and initiated diuresis with 80 mg of IV Lasix.  Will admit the patient to hospitalist medicine for further work-up and management.   Regarding his fall, this is likely mechanical in nature due to his generalized weakness.  CT head without acute fracture or ICH.  X-ray of his right shoulder without evidence of bony injury.  He has no signs of further acute traumatic pathology.  We will provide wound care to his skin tears and cover with bacitracin.  ____________________________________________   FINAL CLINICAL IMPRESSION(S) / ED DIAGNOSES  Final diagnoses:  Laceration of nose without complication, initial encounter  Cardiorenal syndrome with renal failure, stage 1-4 or unspecified chronic kidney disease, with heart failure ([place])  Anasarca      ED Discharge Orders    None       Jalen Oberry   Note:  This document was prepared using Systems analyst and may include unintentional dictation errors.   Vladimir Crofts, MD 10/22/19 [phone]

## 2019-10-21 NOTE — ED Provider Notes (Addendum)
  [place] EMERGENCY DEPARTMENT Provider Note   CSN: [id number] Arrival date & time: 10/21/19  1541    Patient presents the emergency department for multiple medical complaints.  While being triaged patient noted to have small laceration to the bridge of the nose.  Triage nurse requested provider come and evaluate laceration to see if we can stop the bleeding.  Small 0.5 cm diameter laceration along the right side of the bridge of the nose with active bleeding.  Laceration was cleaned with alcohol and pressure was applied above the bridge of the nose to slow the bleeding down.  Dermabond was applied.  Patient tolerated procedure well.  Bleeding controlled.   Procedures .Marland KitchenLaceration Repair  Date/Time: 10/21/2019 5:20 PM Performed by: Duanne Guess, PA-C Authorized by: Duanne Guess, PA-C   Consent:    Consent obtained:  Verbal   Consent given by:  Patient   Risks discussed:  Pain and poor wound healing   Alternatives discussed:  No treatment Anesthesia (see MAR for exact dosages):    Anesthesia method:  None Laceration details:    Location:  Face   Face location:  Nose   Length (cm):  0.5   Depth (mm):  0.5 Repair type:    Repair type:  Simple Exploration:    Wound exploration: wound explored through full range of motion and entire depth of wound probed and visualized     Contaminated: no   Treatment:    Wound cleansed with: alcohol swab.   Amount of cleaning:  Standard Skin repair:    Repair method:  Tissue adhesive Post-procedure details:    Patient tolerance of procedure:  Tolerated well, no immediate complications     Renata Caprice 10/21/19 1722    Duanne Guess, PA-C 10/21/19 1725    Duanne Guess, PA-C 10/21/19 1725    Blake Divine, MD 10/21/19 1731

## 2019-10-22 ENCOUNTER — Other Ambulatory Visit: Payer: Self-pay

## 2019-10-22 DIAGNOSIS — M7989 Other specified soft tissue disorders: Secondary | ICD-10-CM | POA: Diagnosis present

## 2019-10-22 DIAGNOSIS — E785 Hyperlipidemia, unspecified: Secondary | ICD-10-CM | POA: Diagnosis present

## 2019-10-22 DIAGNOSIS — Z85118 Personal history of other malignant neoplasm of bronchus and lung: Secondary | ICD-10-CM | POA: Diagnosis not present

## 2019-10-22 DIAGNOSIS — I5022 Chronic systolic (congestive) heart failure: Secondary | ICD-10-CM | POA: Diagnosis not present

## 2019-10-22 DIAGNOSIS — E118 Type 2 diabetes mellitus with unspecified complications: Secondary | ICD-10-CM | POA: Diagnosis not present

## 2019-10-22 DIAGNOSIS — E11649 Type 2 diabetes mellitus with hypoglycemia without coma: Secondary | ICD-10-CM | POA: Diagnosis not present

## 2019-10-22 DIAGNOSIS — I5043 Acute on chronic combined systolic (congestive) and diastolic (congestive) heart failure: Secondary | ICD-10-CM | POA: Diagnosis not present

## 2019-10-22 DIAGNOSIS — S0121XA Laceration without foreign body of nose, initial encounter: Secondary | ICD-10-CM | POA: Diagnosis not present

## 2019-10-22 DIAGNOSIS — S51011A Laceration without foreign body of right elbow, initial encounter: Secondary | ICD-10-CM | POA: Diagnosis present

## 2019-10-22 DIAGNOSIS — N179 Acute kidney failure, unspecified: Secondary | ICD-10-CM | POA: Diagnosis present

## 2019-10-22 DIAGNOSIS — N1832 Chronic kidney disease, stage 3b: Secondary | ICD-10-CM | POA: Diagnosis not present

## 2019-10-22 DIAGNOSIS — S90822A Blister (nonthermal), left foot, initial encounter: Secondary | ICD-10-CM | POA: Diagnosis present

## 2019-10-22 DIAGNOSIS — S0990XA Unspecified injury of head, initial encounter: Secondary | ICD-10-CM | POA: Diagnosis present

## 2019-10-22 DIAGNOSIS — S90821A Blister (nonthermal), right foot, initial encounter: Secondary | ICD-10-CM | POA: Diagnosis present

## 2019-10-22 DIAGNOSIS — I2510 Atherosclerotic heart disease of native coronary artery without angina pectoris: Secondary | ICD-10-CM | POA: Diagnosis present

## 2019-10-22 DIAGNOSIS — E876 Hypokalemia: Secondary | ICD-10-CM | POA: Diagnosis not present

## 2019-10-22 DIAGNOSIS — E8770 Fluid overload, unspecified: Secondary | ICD-10-CM | POA: Diagnosis not present

## 2019-10-22 DIAGNOSIS — I5023 Acute on chronic systolic (congestive) heart failure: Secondary | ICD-10-CM | POA: Diagnosis not present

## 2019-10-22 DIAGNOSIS — I272 Pulmonary hypertension, unspecified: Secondary | ICD-10-CM | POA: Diagnosis not present

## 2019-10-22 DIAGNOSIS — W010XXA Fall on same level from slipping, tripping and stumbling without subsequent striking against object, initial encounter: Secondary | ICD-10-CM | POA: Diagnosis present

## 2019-10-22 DIAGNOSIS — I130 Hypertensive heart and chronic kidney disease with heart failure and stage 1 through stage 4 chronic kidney disease, or unspecified chronic kidney disease: Secondary | ICD-10-CM | POA: Diagnosis not present

## 2019-10-22 DIAGNOSIS — F17210 Nicotine dependence, cigarettes, uncomplicated: Secondary | ICD-10-CM | POA: Diagnosis present

## 2019-10-22 DIAGNOSIS — R601 Generalized edema: Secondary | ICD-10-CM | POA: Diagnosis not present

## 2019-10-22 DIAGNOSIS — Z951 Presence of aortocoronary bypass graft: Secondary | ICD-10-CM | POA: Diagnosis not present

## 2019-10-22 DIAGNOSIS — I480 Paroxysmal atrial fibrillation: Secondary | ICD-10-CM | POA: Diagnosis not present

## 2019-10-22 DIAGNOSIS — F419 Anxiety disorder, unspecified: Secondary | ICD-10-CM | POA: Diagnosis present

## 2019-10-22 DIAGNOSIS — E1122 Type 2 diabetes mellitus with diabetic chronic kidney disease: Secondary | ICD-10-CM | POA: Diagnosis not present

## 2019-10-22 DIAGNOSIS — I509 Heart failure, unspecified: Secondary | ICD-10-CM | POA: Insufficient documentation

## 2019-10-22 DIAGNOSIS — Y92009 Unspecified place in unspecified non-institutional (private) residence as the place of occurrence of the external cause: Secondary | ICD-10-CM | POA: Diagnosis not present

## 2019-10-22 DIAGNOSIS — I10 Essential (primary) hypertension: Secondary | ICD-10-CM | POA: Diagnosis not present

## 2019-10-22 DIAGNOSIS — D631 Anemia in chronic kidney disease: Secondary | ICD-10-CM | POA: Diagnosis not present

## 2019-10-22 DIAGNOSIS — J449 Chronic obstructive pulmonary disease, unspecified: Secondary | ICD-10-CM | POA: Diagnosis present

## 2019-10-22 DIAGNOSIS — Z20822 Contact with and (suspected) exposure to covid-19: Secondary | ICD-10-CM | POA: Diagnosis present

## 2019-10-22 DIAGNOSIS — Z888 Allergy status to other drugs, medicaments and biological substances status: Secondary | ICD-10-CM | POA: Diagnosis not present

## 2019-10-22 LAB — CBC
HCT: 26.6 % — ABNORMAL LOW (ref 39.0–52.0)
Hemoglobin: 8.6 g/dL — ABNORMAL LOW (ref 13.0–17.0)
MCH: 32.3 pg (ref 26.0–34.0)
MCHC: 32.3 g/dL (ref 30.0–36.0)
MCV: 100 fL (ref 80.0–100.0)
Platelets: 261 10*3/uL (ref 150–400)
RBC: 2.66 MIL/uL — ABNORMAL LOW (ref 4.22–5.81)
RDW: 23.8 % — ABNORMAL HIGH (ref 11.5–15.5)
WBC: 5.9 10*3/uL (ref 4.0–10.5)
nRBC: 0 % (ref 0.0–0.2)

## 2019-10-22 LAB — GLUCOSE, CAPILLARY
Glucose-Capillary: 226 mg/dL — ABNORMAL HIGH (ref 70–99)
Glucose-Capillary: 350 mg/dL — ABNORMAL HIGH (ref 70–99)
Glucose-Capillary: 347 mg/dL — ABNORMAL HIGH (ref 70–99)
Glucose-Capillary: 231 mg/dL — ABNORMAL HIGH (ref 70–99)

## 2019-10-22 LAB — BASIC METABOLIC PANEL
Anion gap: 12 (ref 5–15)
BUN: 37 mg/dL — ABNORMAL HIGH (ref 8–23)
CO2: 23 mmol/L (ref 22–32)
Calcium: 7.5 mg/dL — ABNORMAL LOW (ref 8.9–10.3)
Chloride: 104 mmol/L (ref 98–111)
Creatinine, Ser: 1.79 mg/dL — ABNORMAL HIGH (ref 0.61–1.24)
GFR, Estimated: 39 mL/min — ABNORMAL LOW (ref >60)
Glucose, Bld: 347 mg/dL — ABNORMAL HIGH (ref 70–99)
Potassium: 3.4 mmol/L — ABNORMAL LOW (ref 3.5–5.1)
Sodium: 139 mmol/L (ref 135–145)

## 2019-10-22 LAB — MAGNESIUM: Magnesium: 1.9 mg/dL (ref 1.7–2.4)

## 2019-10-22 MED ORDER — ACETAMINOPHEN 325 MG PO TABS
650.0000 mg | ORAL_TABLET | ORAL | Status: DC | PRN
  Administered 2019-10-22 – 2019-10-23 (×2): 650 mg via ORAL
  Filled 2019-10-22 (×2): qty 2

## 2019-10-22 MED ORDER — ALBUMIN HUMAN 25 % IV SOLN
12.5000 g | Freq: Two times a day (BID) | INTRAVENOUS | Status: DC
  Administered 2019-10-22 – 2019-10-23 (×2): 12.5 g via INTRAVENOUS
  Filled 2019-10-22 (×5): qty 50

## 2019-10-22 MED ORDER — INSULIN ASPART 100 UNIT/ML ~~LOC~~ SOLN
0.0000 [IU] | Freq: Every day | SUBCUTANEOUS | Status: DC
  Administered 2019-10-22: 2 [IU] via SUBCUTANEOUS

## 2019-10-22 MED ORDER — SODIUM CHLORIDE 0.9% FLUSH: 3.0000 mL | INTRAVENOUS | Status: DC | PRN

## 2019-10-22 MED ORDER — SODIUM CHLORIDE 0.9 % IV SOLN: 250.0000 mL | INTRAVENOUS | Status: DC | PRN

## 2019-10-22 MED ORDER — AMIODARONE HCL 200 MG PO TABS
200.0000 mg | ORAL_TABLET | Freq: Every day | ORAL | Status: DC
  Administered 2019-10-22 – 2019-10-28 (×7): 200 mg via ORAL
  Filled 2019-10-22 (×7): qty 1

## 2019-10-22 MED ORDER — SODIUM CHLORIDE 0.9% FLUSH
3.0000 mL | Freq: Two times a day (BID) | INTRAVENOUS | Status: DC
  Administered 2019-10-22 – 2019-10-28 (×14): 3 mL via INTRAVENOUS

## 2019-10-22 MED ORDER — FUROSEMIDE 10 MG/ML IJ SOLN
8.0000 mg/h | INTRAVENOUS | Status: DC
  Administered 2019-10-22: 5 mg/h via INTRAVENOUS
  Administered 2019-10-24 – 2019-10-26 (×2): 8 mg/h via INTRAVENOUS
  Filled 2019-10-22 (×3): qty 25

## 2019-10-22 MED ORDER — ONDANSETRON HCL 4 MG/2ML IJ SOLN: 4.0000 mg | Freq: Four times a day (QID) | INTRAMUSCULAR | Status: DC | PRN

## 2019-10-22 MED ORDER — FUROSEMIDE 10 MG/ML IJ SOLN
40.0000 mg | Freq: Two times a day (BID) | INTRAMUSCULAR | Status: DC
  Administered 2019-10-22: 40 mg via INTRAVENOUS
  Filled 2019-10-22: qty 4

## 2019-10-22 MED ORDER — APIXABAN 5 MG PO TABS
5.0000 mg | ORAL_TABLET | Freq: Two times a day (BID) | ORAL | Status: DC
  Administered 2019-10-22 – 2019-10-28 (×13): 5 mg via ORAL
  Filled 2019-10-22 (×13): qty 1

## 2019-10-22 MED ORDER — INSULIN ASPART 100 UNIT/ML ~~LOC~~ SOLN
0.0000 [IU] | Freq: Three times a day (TID) | SUBCUTANEOUS | Status: DC
  Administered 2019-10-22 (×2): 11 [IU] via SUBCUTANEOUS
  Administered 2019-10-22: 5 [IU] via SUBCUTANEOUS
  Administered 2019-10-23: 3 [IU] via SUBCUTANEOUS
  Administered 2019-10-23: 5 [IU] via SUBCUTANEOUS
  Administered 2019-10-24 (×2): 8 [IU] via SUBCUTANEOUS
  Administered 2019-10-25: 2 [IU] via SUBCUTANEOUS
  Administered 2019-10-25 – 2019-10-26 (×2): 3 [IU] via SUBCUTANEOUS
  Administered 2019-10-26: 8 [IU] via SUBCUTANEOUS
  Administered 2019-10-26: 15 [IU] via SUBCUTANEOUS
  Administered 2019-10-27 (×2): 8 [IU] via SUBCUTANEOUS
  Administered 2019-10-27: 2 [IU] via SUBCUTANEOUS
  Administered 2019-10-28: 5 [IU] via SUBCUTANEOUS
  Filled 2019-10-22 (×17): qty 1

## 2019-10-22 MED ORDER — SPIRONOLACTONE 25 MG PO TABS
25.0000 mg | ORAL_TABLET | Freq: Every day | ORAL | Status: DC
  Administered 2019-10-22 – 2019-10-28 (×7): 25 mg via ORAL
  Filled 2019-10-22 (×7): qty 1

## 2019-10-22 MED ORDER — INSULIN DETEMIR 100 UNIT/ML ~~LOC~~ SOLN
20.0000 [IU] | Freq: Every day | SUBCUTANEOUS | Status: DC
  Filled 2019-10-22 (×3): qty 0.2

## 2019-10-22 MED ORDER — BACITRACIN ZINC 500 UNIT/GM EX OINT
TOPICAL_OINTMENT | Freq: Once | CUTANEOUS | Status: AC
  Filled 2019-10-22: qty 0.9

## 2019-10-22 NOTE — TOC Initial Note (Signed)
Transition of Care [facility]) - Initial/Assessment Note    Patient Details  Name: Craig Koch MRN: [id number] Date of Birth: 07/09/1955  Transition of Care [facility]) CM/SW Contact:    Victorino Dike, RN Phone Number: 10/22/2019, 3:14 PM  Clinical Narrative:                  Met with a pleasant gentleman in room.  He reports living at home with his wife.  She helps him with care as he needs it, she also does all the shopping and meal preparation in home.  Patient has son and grandson that come help with outside chores as needed.    States he uses a cane to walk with outside of the home, but mostly can use wall or furniture inside the home.  He does have a walker that he does not use at this time.  He is not interested in [place] services at this time.  He feels he can call the dr as needed.   Expected Discharge Plan: Home/Self Care Barriers to Discharge: Continued Medical Work up   Patient Goals and CMS Choice Patient states their goals for this hospitalization and ongoing recovery are:: To return home with wife CMS Medicare.gov Compare Post Acute Care list provided to:: Patient Choice offered to / list presented to : Patient  Expected Discharge Plan and Services Expected Discharge Plan: Home/Self Care   Discharge Planning Services: CM Consult Post Acute Care Choice: NA Living arrangements for the past 2 months: Single Family Home                                      Prior Living Arrangements/Services Living arrangements for the past 2 months: Single Family Home Lives with:: Self, Spouse Patient language and need for interpreter reviewed:: Yes Do you feel safe going back to the place where you live?: Yes      Need for Family Participation in Patient Care: Yes (Comment) Care giver support system in place?: Yes (comment) Current home services: DME ((cane and walker)) Criminal Activity/Legal Involvement Pertinent to Current Situation/Hospitalization: No - Comment  as needed  Activities of Daily Living      Permission Sought/Granted                  Emotional Assessment Appearance:: Appears older than stated age Attitude/Demeanor/Rapport: Engaged, Ambitious Affect (typically observed): Pleasant, Stoic, Appropriate, Calm Orientation: : Oriented to Self, Oriented to Place, Oriented to Situation, Oriented to  Time Alcohol / Substance Use: Not Applicable Psych Involvement: No (comment)  Admission diagnosis:  Acute CHF (congestive heart failure) (HCC) [I50.9] Swelling of both lower extremities [M79.89] Patient Active Problem List   Diagnosis Date Noted  . Acute CHF (congestive heart failure) ([place]) 10/22/2019  . Anasarca 10/22/2019  . Swelling of both lower extremities 10/22/2019  . Blister of foot 10/11/2019  . Skin tear of hand without complication 88/41/6606  . Epistaxis 10/04/2019  . Stage 4 chronic kidney disease ([place]) 09/25/2019  . Alkaline phosphatase elevation 09/25/2019  . Confusion 09/25/2019  . Hallucination 08/19/2019  . Encounter for antineoplastic immunotherapy 04/17/2019  . Fatigue 04/17/2019  . Weight loss 04/17/2019  . HCAP (healthcare-associated pneumonia) 03/26/2019  . Hypoxemia 03/26/2019  . ESRD on hemodialysis ([place]) 03/26/2019  . Hx of CABG 03/26/2019  . AF (paroxysmal atrial fibrillation) ([place]) 03/26/2019  . Acute on chronic combined systolic (congestive) and diastolic (congestive) heart  failure ([place]) 03/26/2019  . S/P thoracentesis   . Pleural effusion   . Acute systolic heart failure ([place])   . Pain of right upper extremity   . Microcytic anemia   . Protein-calorie malnutrition, severe 03/06/2019  . Acute alteration in mental status   . Palliative care encounter   . Atrial fibrillation with RVR ([place]) 03/03/2019  . DKA (diabetic ketoacidoses) 03/03/2019  . Hypothermia 03/03/2019  . SOB (shortness of breath) 03/03/2019  . Acute renal failure ([place]) 03/03/2019  . NSTEMI (non-ST elevated myocardial  infarction) ([place]) 03/03/2019  . Hyponatremia 02/08/2019  . Uncontrolled type 2 diabetes mellitus with hyperglycemia ([place]) 02/08/2019  . Pedal edema 02/08/2019  . Encounter for antineoplastic chemotherapy 12/08/2018  . Tobacco abuse 10/12/2018  . Administrative encounter 09/23/2018  . Malignant neoplasm of unspecified part of unspecified bronchus or lung ([place]) 09/08/2018  . Goals of care, counseling/discussion 09/08/2018  . Recurrent pleural effusion on left 09/02/2018  . Elevated troponin 08/24/2018  . Medicare annual wellness visit, subsequent 08/07/2018  . Lower urinary tract symptoms (LUTS) 08/07/2018  . Neck pain 08/07/2018  . CAD (coronary artery disease), native coronary artery 09/23/2017  . Smoker 09/23/2017  . Chronic joint pain 10/18/2016  . Healthcare maintenance 07/17/2016  . Advance care planning 07/17/2016  . Fungal rash of torso 05/06/2016  . Trigger finger 05/06/2016  . History of alcohol use 11/17/2015  . Skin tear of forearm without complication 16/10/9602  . Anxiety state 02/19/2015  . Hand weakness 05/23/2011  . Diabetes mellitus with complication ([place]) 54/09/8117  . HLD (hyperlipidemia) 03/05/2010  . Essential hypertension 03/05/2010  . Coronary atherosclerosis 03/05/2010   PCP:  Tonia Ghent, MD Pharmacy:   [id number] [id number]: [phone]: [phone] [id number]: [phone]: [phone] [id number] [address]: [phone]: [phone]   Social Determinants of Health (SDOH) Interventions    Readmission Risk Interventions Readmission Risk Prevention Plan 03/28/2019 03/06/2019  Transportation Screening Complete Complete  PCP or Specialist Appt within 3-5  Days - Complete  Medication Review (RNP[place] Tobacco VillageComplete Complete  PCP or Specialist appointment within 3-5 days of discharge Complete -  [facility] Consult Complete -  SW Recovery Care/Counseling Consult Complete -  Palliative Care Screening Complete -  [place] Refused -  Some recent data might be hidden

## 2019-10-22 NOTE — Telephone Encounter (Signed)
Done.. Pts 10/23/19 CT and his 10/25/19 lab/MD appt has been cx as requested

## 2019-10-22 NOTE — Telephone Encounter (Signed)
Please cancel CT scan and appts at [facility] this week.  Will get him scheduled when discharged from hospital.  Wife is aware.

## 2019-10-22 NOTE — Telephone Encounter (Signed)
Done.. Labs has been added to his 10/22 appt. as requested called and spoke with pts wife Georga Kaufmann she stated that he was  currently in the ER and said that Dr. Philipp Ovens was going to admit him. She wants to know if CT can be done while his Inpatient before she cx the 10/23/19 CT  that's already sched. Also she wanted to let Dr. Tasia Catchings know that he had a bad fall on  yesterday. She ask if someone can give her a call back asap. @ [phone]

## 2019-10-22 NOTE — ED Notes (Signed)
Pharmacy contacted for medication reconciliation.

## 2019-10-22 NOTE — Progress Notes (Signed)
[facility]  ROUNDING NOTE   Subjective:  Craig Koch 64 years old gentleman with a history of CKD stage IIIb, follows up with Dr. Holley Raring as outpatient, was sent to the ED for bilateral lower extremity edema, volume overload, concern for cardiorenal syndrome.   He also has past medical history of diabetes, hypertension, high lipids, and CHF. We will start him on Lasix infusion 5 mg/h   Objective:  Vital signs in last 24 hours:  Temp:  [98.6 F (37 C)] 98.6 F (37 C) (10/18 1703) Pulse Rate:  [80-97] 97 (10/19 1130) Resp:  [16-21] 16 (10/19 0530) BP: (101-129)/(63-92) 121/79 (10/19 1130) SpO2:  [92 %-99 %] 93 % (10/19 1130) Weight:  [99.3 kg] 99.3 kg (10/18 1704)  Weight change:  Filed Weights   10/21/19 1704  Weight: 99.3 kg    Intake/Output: No intake/output data recorded.   Intake/Output this shift:  Total I/O In: -  Out: 200 [Urine:200]  Physical Exam: General:  Resting in bed in no acute distress  Head: Normocephalic, atraumatic. Moist oral mucosal membranes  Eyes: Anicteric  Neck: Supple, trachea at  midline  Lungs:   Diminished, fine crackles at the bases,normal and symmetrical respiratory effort,able to talk in full sentences  Heart: Regular rate and rhythm,HR in 90's  Abdomen:  Soft, nontender, lower abdominal swelling +  Extremities:  3+ peripheral edema.  Neurologic: Oriented x3 ,  moving all four extremities  Skin: Bilateral lower legs with blisters,erythematous, laceration on nose, Rt.elbow with dressing    Basic Metabolic Panel: Recent Labs  Lab 10/17/19 0914 10/21/19 1731  NA 137 139  K 3.6 2.8*  CL 99 99  CO2 20 28  GLUCOSE 129* 149*  BUN 40* 39*  CREATININE 2.08* 2.01*  CALCIUM 8.5* 8.0*    Liver Function Tests: Recent Labs  Lab 10/21/19 2202  AST 58*  ALT 39  ALKPHOS 321*  BILITOT 1.1  PROT 6.3*  ALBUMIN 2.4*   No results for input(s): LIPASE, AMYLASE in the last 168 hours. No results for input(s): AMMONIA in  the last 168 hours.  CBC: Recent Labs  Lab 10/17/19 0914 10/21/19 1731  WBC 6.4 7.5  NEUTROABS 4.7  --   HGB 9.4* 10.0*  HCT 29.2* 31.2*  MCV 95 99.4  PLT 229 303    Cardiac Enzymes: No results for input(s): CKTOTAL, CKMB, CKMBINDEX, TROPONINI in the last 168 hours.  BNP: Invalid input(s): POCBNP  CBG: Recent Labs  Lab 10/22/19 0811 10/22/19 1141  GLUCAP 226* 350*    Microbiology: Results for orders placed or performed during the hospital encounter of 10/21/19  Respiratory Panel by RT PCR (Flu A&B, Covid) - Nasopharyngeal Swab     Status: None   Collection Time: 10/21/19  8:40 PM   Specimen: Nasopharyngeal Swab  Result Value Ref Range Status   SARS Coronavirus 2 by RT PCR NEGATIVE NEGATIVE Final    Comment: (NOTE) SARS-CoV-2 target nucleic acids are NOT DETECTED.  The SARS-CoV-2 RNA is generally detectable in upper respiratoy specimens during the acute phase of infection. The lowest concentration of SARS-CoV-2 viral copies this assay can detect is 131 copies/mL. A negative result does not preclude SARS-Cov-2 infection and should not be used as the sole basis for treatment or other patient management decisions. A negative result may occur with  improper specimen collection/handling, submission of specimen other than nasopharyngeal swab, presence of viral mutation(s) within the areas targeted by this assay, and inadequate number of viral copies (<131 copies/mL). A  negative result must be combined with clinical observations, patient history, and epidemiological information. The expected result is Negative.  Fact Sheet for Patients:  PinkCheek.be  Fact Sheet for Healthcare Providers:  GravelBags.it  This test is no t yet approved or cleared by the [place] FDA and  has been authorized for detection and/or diagnosis of SARS-CoV-2 by FDA under an Emergency Use Authorization (EUA). This EUA will remain   in effect (meaning this test can be used) for the duration of the COVID-19 declaration under Section 564(b)(1) of the Act, 21 U.S.C. section 360bbb-3(b)(1), unless the authorization is terminated or revoked sooner.     Influenza A by PCR NEGATIVE NEGATIVE Final   Influenza B by PCR NEGATIVE NEGATIVE Final    Comment: (NOTE) The Xpert Xpress SARS-CoV-2/FLU/RSV assay is intended as an aid in  the diagnosis of influenza from Nasopharyngeal swab specimens and  should not be used as a sole basis for treatment. Nasal washings and  aspirates are unacceptable for Xpert Xpress SARS-CoV-2/FLU/RSV  testing.  Fact Sheet for Patients: PinkCheek.be  Fact Sheet for Healthcare Providers: GravelBags.it  This test is not yet approved or cleared by the [place] FDA and  has been authorized for detection and/or diagnosis of SARS-CoV-2 by  FDA under an Emergency Use Authorization (EUA). This EUA will remain  in effect (meaning this test can be used) for the duration of the  Covid-19 declaration under Section 564(b)(1) of the Act, 21  U.S.C. section 360bbb-3(b)(1), unless the authorization is  terminated or revoked. Performed at [facility], [address] [place], [place] [address]     Coagulation Studies: No results for input(s): LABPROT, INR in the last 72 hours.  Urinalysis: No results for input(s): COLORURINE, LABSPEC, PHURINE, GLUCOSEU, HGBUR, BILIRUBINUR, KETONESUR, PROTEINUR, UROBILINOGEN, NITRITE, LEUKOCYTESUR in the last 72 hours.  Invalid input(s): APPERANCEUR    Imaging: DG Chest 2 View  Result Date: 10/21/2019 CLINICAL DATA:  Bilateral lower extremity swelling and weight gain. EXAM: CHEST - 2 VIEW COMPARISON:  July 29, 2019 FINDINGS: Multiple sternal wires and vascular clips are present. Mild, diffusely increased interstitial lung markings are noted. Mild to moderate severity infiltrates are seen within  the mid and lower left lung. This is mildly increased in severity when compared to the prior study. The infiltrates seen within the right lung on the prior study are markedly decreased in severity when compared to the prior exam, with a very small amount of residual right basilar atelectasis and/or infiltrate seen. Small, stable bilateral pleural effusions are present. No pneumothorax is identified. The cardiac silhouette is mildly enlarged. The visualized skeletal structures are unremarkable. IMPRESSION: 1. Mild to moderate severity left lower lobe infiltrate, mildly increased in severity when compared to the prior study. 2. Very mild residual right basilar atelectasis and/or infiltrate. 3. Small, stable bilateral pleural effusions. Electronically Signed   By: Virgina Norfolk M.D.   On: 10/21/2019 21:11   DG Shoulder Right  Result Date: 10/21/2019 CLINICAL DATA:  Recent fall with shoulder pain, initial encounter EXAM: RIGHT SHOULDER - 2+ VIEW COMPARISON:  None. FINDINGS: Degenerative changes of the acromioclavicular joint are seen. No acute fracture or dislocation is noted. No soft tissue abnormality is seen. Ribcage is within normal limits. IMPRESSION: Degenerative change without acute abnormality. Electronically Signed   By: Inez Catalina M.D.   On: 10/21/2019 22:45   CT Head Wo Contrast  Result Date: 10/21/2019 CLINICAL DATA:  Fall.  Head injury.  History of lung cancer. EXAM: CT  HEAD WITHOUT CONTRAST CT CERVICAL SPINE WITHOUT CONTRAST TECHNIQUE: Multidetector CT imaging of the head and cervical spine was performed following the standard protocol without intravenous contrast. Multiplanar CT image reconstructions of the cervical spine were also generated. COMPARISON:  CT head 03/05/2019 FINDINGS: CT HEAD FINDINGS Brain: Generalized atrophy unchanged. Mild white matter hypodensity bilaterally unchanged. Negative for acute infarct, hemorrhage, mass. Vascular: Negative for hyperdense vessel Skull:  Negative Sinuses/Orbits: Paranasal sinuses clear.  Left cataract extraction. Other: None CT CERVICAL SPINE FINDINGS Alignment: 3 mm anterolisthesis C3-4 and C4-5 Skull base and vertebrae: Negative for fracture Soft tissues and spinal canal: Negative for mass or adenopathy in the neck. Atherosclerotic calcification carotid bifurcation bilaterally. Disc levels: Multilevel disc and facet degeneration throughout the cervical spine. Multilevel spinal stenosis throughout the cervical spine most prominent C5-6 and C6-7. Upper chest: Moderately large right pleural effusion and small left pleural effusion. Apical emphysema. Other: None IMPRESSION: 1. No acute intracranial abnormality 2. Negative for cervical spine fracture.  Cervical spondylosis 3. Moderately large right pleural effusion and mild left pleural effusion. Electronically Signed   By: Franchot Gallo M.D.   On: 10/21/2019 17:59   CT Cervical Spine Wo Contrast  Result Date: 10/21/2019 CLINICAL DATA:  Fall.  Head injury.  History of lung cancer. EXAM: CT HEAD WITHOUT CONTRAST CT CERVICAL SPINE WITHOUT CONTRAST TECHNIQUE: Multidetector CT imaging of the head and cervical spine was performed following the standard protocol without intravenous contrast. Multiplanar CT image reconstructions of the cervical spine were also generated. COMPARISON:  CT head 03/05/2019 FINDINGS: CT HEAD FINDINGS Brain: Generalized atrophy unchanged. Mild white matter hypodensity bilaterally unchanged. Negative for acute infarct, hemorrhage, mass. Vascular: Negative for hyperdense vessel Skull: Negative Sinuses/Orbits: Paranasal sinuses clear.  Left cataract extraction. Other: None CT CERVICAL SPINE FINDINGS Alignment: 3 mm anterolisthesis C3-4 and C4-5 Skull base and vertebrae: Negative for fracture Soft tissues and spinal canal: Negative for mass or adenopathy in the neck. Atherosclerotic calcification carotid bifurcation bilaterally. Disc levels: Multilevel disc and facet degeneration  throughout the cervical spine. Multilevel spinal stenosis throughout the cervical spine most prominent C5-6 and C6-7. Upper chest: Moderately large right pleural effusion and small left pleural effusion. Apical emphysema. Other: None IMPRESSION: 1. No acute intracranial abnormality 2. Negative for cervical spine fracture.  Cervical spondylosis 3. Moderately large right pleural effusion and mild left pleural effusion. Electronically Signed   By: Franchot Gallo M.D.   On: 10/21/2019 17:59     Medications:   . sodium chloride    . albumin human    . furosemide (LASIX) infusion     . amiodarone  200 mg Oral Daily  . apixaban  5 mg Oral BID  . insulin aspart  0-15 Units Subcutaneous TID WC  . insulin aspart  0-5 Units Subcutaneous QHS  . insulin detemir  20 Units Subcutaneous QHS  . sodium chloride flush  3 mL Intravenous Q12H  . spironolactone  25 mg Oral Daily   sodium chloride, acetaminophen, ondansetron (ZOFRAN) IV, sodium chloride flush  Assessment/ Plan:  Craig. ASHAZ ROBLING is a 64 y.o.  male gentleman with a history of CKD stage IIIb, follows up with Dr. Holley Raring as outpatient, was sent to the ED for bilateral lower extremity edema, volume overload, concern for cardiorenal syndrome.   He also has past medical history of diabetes, hypertension, high lipids, and CHF.  # CKD Stage 111b # Acute on Chronic CHF  Creatinine 2.01/GFR 34 on 10/21/2019 BNP : 2,099  -Appears fluid overloaded on  assessment -Has 3+ bilateral lower extremity edema, dyspnea on exertion -We will start Lasix IV infusion 5 mg/h -We will continue monitoring closely  # Hypokalemia Potassium 2.8 today Getting supplemented with intravenous and Oral Potassium  #Diabetes with CKD Blood glucose readings within acceptable range On sliding scale Aspart and Insulin Levemir  #Hypertension -Normotensive today -Lasix infusion  #Anemia of CKD Hemoglobin 8.6 Oral iron supplements as outpatient   LOS: 0 Jhade Berko 10/19/202112:59 PM

## 2019-10-22 NOTE — Progress Notes (Addendum)
Pt transferred to [facility] bed 207 via stretcher accompanied by transporter on RA, on a Lasix drip at 5mg /hr. No signs of distress noted.

## 2019-10-22 NOTE — H&P (Signed)
History and Physical    Craig Koch NID:[id number] DOB: 18-Mar-1955 DOA: 10/21/2019  PCP: Tonia Ghent, MD   Patient coming from: Home  I have personally briefly reviewed patient's old medical records in [place]  Chief Complaint: Lower extremity swelling and weight gain  HPI: Craig Koch is a 64 y.o. male with medical history significant for CAD s/p CABGx4, HFrEF EF 30-35% 3/21, paroxysmal atrial fib, HTN, DM2, COPD lung cancer in remission,, as well as history of AKI requiring temporary HD, who presents to the emergency room with worsening lower extremity edema and weight gain associated with orthopnea in spite of outpatient adjustment in his diuretic medication.  He denies chest pain, cough fever or chills.  Patient states the swelling in the lower extremity has affected his ability to ambulate because he feels like his legs are very heavy and plus he has had some blistering of his feet as a result.  Said he fell on the day of arrival suffering a skin tear of his right elbow and on his nasal bridge ED Course: Vital signs on arrival were within normal limits.  CBC notable for hemoglobin of 10.  WBC was normal at 7.5.  BMP notable for potassium of 2.8 and creatinine of 2.01 which is about his baseline.  BNP elevated at 2099. EKG as reviewed by me : Sinus rhythm 87 with nonspecific nonacute ST-T wave changes  Review of Systems: As per HPI otherwise all other systems on review of systems negative.    Past Medical History:  Diagnosis Date  . Anxiety   . Arthritis   . CHF (congestive heart failure) ([place])   . Coronary artery disease   . Diabetes mellitus   . Dyslipidemia   . Hx of CABG   . Hyperlipidemia   . Hypertension   . Malignant neoplasm of unspecified part of unspecified bronchus or lung ([place]) 08/2018   Immunotherapy  . Pleural effusion     Past Surgical History:  Procedure Laterality Date  . CATARACT EXTRACTION Left 09/2015  . CHEST TUBE INSERTION Left  10/01/2018   Procedure: INSERTION PLEURAL DRAINAGE CATHETER;  Surgeon: [name], MD;  Location: [facility] ORS;  Service: Thoracic;  Laterality: Left;  . CORONARY ARTERY BYPASS GRAFT  2004   (CABG with LIMA to the  LAD, SVG to OM2/OM3, SVG  to diag  . DIALYSIS/PERMA CATHETER INSERTION N/A 03/14/2019   Procedure: DIALYSIS/PERMA CATHETER INSERTION;  Surgeon: Algernon Huxley, MD;  Location: [place] [facility];  Service: Cardiovascular;  Laterality: N/A;  . DIALYSIS/PERMA CATHETER REMOVAL N/A 05/28/2019   Procedure: DIALYSIS/PERMA CATHETER REMOVAL;  Surgeon: Katha Cabal, MD;  Location: [place] [facility];  Service: Cardiovascular;  Laterality: N/A;  . Left ankle surgery     repair of fracture  . Right lower leg surgery     rod  . TEMPORARY DIALYSIS CATHETER N/A 03/11/2019   Procedure: TEMPORARY DIALYSIS CATHETER;  Surgeon: Algernon Huxley, MD;  Location: [place] [facility];  Service: Cardiovascular;  Laterality: N/A;     reports that he has been smoking cigarettes. He has a 9.00 pack-year smoking history. He has quit using smokeless tobacco.  His smokeless tobacco use included snuff. He reports previous alcohol use of about 6.0 standard drinks of alcohol per week. He reports that he does not use drugs.  Allergies  Allergen Reactions  . Promethazine Other (See Comments)    QT prolongation.   . Trazodone And Nefazodone Other (See Comments)  QT prolongation.   . Lipitor [Atorvastatin Calcium] Other (See Comments)    Aches.  Tolerated crestor.     Family History  Problem Relation Age of Onset  . Dementia Mother   . Heart disease Father   . Colon cancer Neg Hx   . Prostate cancer Neg Hx   . Diabetes Neg Hx       Prior to Admission medications   Medication Sig Start Date End Date Taking? Authorizing Provider  insulin aspart (NOVOLOG) 100 UNIT/ML injection Inject 0-8 Units into the skin as directed. Take 0 units if CBG 70-150 Take 1 units if CBG 151-200 Take 2 units if CBG  201-250 Take 3 units if CBG 251-300 Take 4 units if CBG 301-350 Take 6 units if CBG 351-400 If CBG > 400, give 8 units and call MD 04/15/19  Yes Tonia Ghent, MD  albuterol (VENTOLIN HFA) 108 (90 Base) MCG/ACT inhaler Inhale 2 puffs into the lungs every 6 (six) hours as needed for wheezing or shortness of breath. 04/01/19 10/04/19  Wyvonnia Dusky, MD  amiodarone (PACERONE) 200 MG tablet Take 1 tablet (200 mg total) by mouth daily. 10/14/19   Theora Gianotti, NP  apixaban (ELIQUIS) 5 MG TABS tablet Take 1 tablet (5 mg total) by mouth 2 (two) times daily. 05/10/19   Marrianne Mood D, PA-C  bismuth subsalicylate (PEPTO-BISMOL) 262 MG/15ML suspension Take 30 mLs by mouth every 6 (six) hours as needed for diarrhea or loose stools. 08/30/19   Tonia Ghent, MD  DULoxetine (CYMBALTA) 30 MG capsule TAKE 1 CAPSULE BY MOUTH EVERY DAY 09/16/19   Tonia Ghent, MD  ezetimibe (ZETIA) 10 MG tablet Take 1 tablet (10 mg total) by mouth daily. 05/10/19 09/16/28  Marrianne Mood D, PA-C  ferrous sulfate 325 (65 FE) MG tablet TAKE 1 TABLET (325 MG TOTAL) BY MOUTH 2 (TWO) TIMES DAILY WITH A MEAL. 10/11/19 11/10/19  Tonia Ghent, MD  folic acid (FOLVITE) 607 MCG tablet Take 400 mcg by mouth daily.    [provider]  insulin detemir (LEVEMIR) 100 UNIT/ML injection Inject 0.2 mLs (20 Units total) into the skin at bedtime. 06/14/19   Tonia Ghent, MD  ipratropium-albuterol (DUONEB) 0.5-2.5 (3) MG/3ML SOLN Take 3 mLs by nebulization 4 (four) times daily. 06/12/19   [provider]  metoprolol succinate (TOPROL-XL) 25 MG 24 hr tablet TAKE 1/2 TABLET BY MOUTH EVERY DAY 10/18/19   Tonia Ghent, MD  Multiple Vitamin (MULTIVITAMIN WITH MINERALS) TABS tablet Take 1 tablet by mouth at bedtime.    [provider]  multivitamin (RENA-VIT) TABS tablet Take 1 tablet by mouth at bedtime. 03/19/19   Ezekiel Slocumb, DO  mupirocin ointment (BACTROBAN) 2 % Apply 1 application  topically 2 (two) times daily. Affected areas 10/11/19   Tower, Wynelle Fanny, MD  potassium chloride (KLOR-CON) 10 MEQ tablet TAKE 1 TABLET BY MOUTH EVERY DAY 10/10/19   Tonia Ghent, MD  promethazine (PHENERGAN) 25 MG tablet Take 25 mg by mouth every 6 (six) hours as needed for nausea or vomiting.    [provider]  rosuvastatin (CRESTOR) 10 MG tablet TAKE 1 TABLET (10 MG TOTAL) BY MOUTH DAILY. Patient taking differently: Take 10 mg by mouth daily.  01/07/19   Tonia Ghent, MD  torsemide (DEMADEX) 20 MG tablet Take 2 tablets (40 mg total) by mouth daily. 10/17/19   Loel Dubonnet, NP    Physical Exam: Vitals:   10/21/19 2300 10/22/19  0000 10/22/19 0100 10/22/19 0200  BP: 122/72 (!) 128/92 123/66 129/79  Pulse: 84 81 80 89  Resp: 20 (!) 21 20 17   Temp:      TempSrc:      SpO2: 99% 96% 95% 93%  Weight:      Height:         Vitals:   10/21/19 2300 10/22/19 0000 10/22/19 0100 10/22/19 0200  BP: 122/72 (!) 128/92 123/66 129/79  Pulse: 84 81 80 89  Resp: 20 (!) 21 20 17   Temp:      TempSrc:      SpO2: 99% 96% 95% 93%  Weight:      Height:          Constitutional: Alert and oriented x 3 .  Conversational dyspnea HEENT:      Head: Normocephalic , samll 1cm skin tear nasal bridge         Eyes: PERLA, EOMI, Conjunctivae are normal. Sclera is non-icteric.       Mouth/Throat: Mucous membranes are moist.       Neck: Supple with no signs of meningismus. Cardiovascular: Regular rate and rhythm. No murmurs, gallops, or rubs. 2+ symmetrical distal pulses are present . No JVD. No 3+LE edema Respiratory: Respiratory effort increased.Lungs sounds diminished bilaterally.  Crackles up to mid lung bilaterally gastrointestinal: Soft, non tender, and non distended with positive bowel sounds. No rebound or guarding. Genitourinary: No CVA tenderness. Musculoskeletal: Nontender with normal range of motion in all extremities. No cyanosis, or erythema of extremities. Neurologic:  Face is  symmetric. Moving all extremities. No gross focal neurologic deficits . Skin: deroofed blisters dorsum bilateral feet with redness but no diferential warmth Psychiatric: Mood and affect are normal    Labs on Admission: I have personally reviewed following labs and imaging studies  CBC: Recent Labs  Lab 10/17/19 0914 10/21/19 1731  WBC 6.4 7.5  NEUTROABS 4.7  --   HGB 9.4* 10.0*  HCT 29.2* 31.2*  MCV 95 99.4  PLT 229 329   Basic Metabolic Panel: Recent Labs  Lab 10/17/19 0914 10/21/19 1731  NA 137 139  K 3.6 2.8*  CL 99 99  CO2 20 28  GLUCOSE 129* 149*  BUN 40* 39*  CREATININE 2.08* 2.01*  CALCIUM 8.5* 8.0*   GFR: Estimated Creatinine Clearance: 45.3 mL/min (A) (by C-G formula based on SCr of 2.01 mg/dL (H)). Liver Function Tests: Recent Labs  Lab 10/21/19 2202  AST 58*  ALT 39  ALKPHOS 321*  BILITOT 1.1  PROT 6.3*  ALBUMIN 2.4*   No results for input(s): LIPASE, AMYLASE in the last 168 hours. No results for input(s): AMMONIA in the last 168 hours. Coagulation Profile: No results for input(s): INR, PROTIME in the last 168 hours. Cardiac Enzymes: No results for input(s): CKTOTAL, CKMB, CKMBINDEX, TROPONINI in the last 168 hours. BNP (last 3 results) Recent Labs    08/15/19 1000  PROBNP 1,476.0*   HbA1C: No results for input(s): HGBA1C in the last 72 hours. CBG: No results for input(s): GLUCAP in the last 168 hours. Lipid Profile: No results for input(s): CHOL, HDL, LDLCALC, TRIG, CHOLHDL, LDLDIRECT in the last 72 hours. Thyroid Function Tests: No results for input(s): TSH, T4TOTAL, FREET4, T3FREE, THYROIDAB in the last 72 hours. Anemia Panel: No results for input(s): VITAMINB12, FOLATE, FERRITIN, TIBC, IRON, RETICCTPCT in the last 72 hours. Urine analysis:    Component Value Date/Time   COLORURINE YELLOW (A) 03/17/2019 1940   APPEARANCEUR HAZY (A) 03/17/2019 1940  LABSPEC 1.017 03/17/2019 1940   PHURINE 5.0 03/17/2019 1940   GLUCOSEU 50 (A)  03/17/2019 [place] NEGATIVE 03/17/2019 [place] NEGATIVE 03/17/2019 1940   KETONESUR 5 (A) 03/17/2019 1940   PROTEINUR 100 (A) 03/17/2019 1940   NITRITE NEGATIVE 03/17/2019 1940   LEUKOCYTESUR NEGATIVE 03/17/2019 1940    Radiological Exams on Admission: DG Chest 2 View  Result Date: 10/21/2019 CLINICAL DATA:  Bilateral lower extremity swelling and weight gain. EXAM: CHEST - 2 VIEW COMPARISON:  July 29, 2019 FINDINGS: Multiple sternal wires and vascular clips are present. Mild, diffusely increased interstitial lung markings are noted. Mild to moderate severity infiltrates are seen within the mid and lower left lung. This is mildly increased in severity when compared to the prior study. The infiltrates seen within the right lung on the prior study are markedly decreased in severity when compared to the prior exam, with a very small amount of residual right basilar atelectasis and/or infiltrate seen. Small, stable bilateral pleural effusions are present. No pneumothorax is identified. The cardiac silhouette is mildly enlarged. The visualized skeletal structures are unremarkable. IMPRESSION: 1. Mild to moderate severity left lower lobe infiltrate, mildly increased in severity when compared to the prior study. 2. Very mild residual right basilar atelectasis and/or infiltrate. 3. Small, stable bilateral pleural effusions. Electronically Signed   By: Virgina Norfolk M.D.   On: 10/21/2019 21:11   DG Shoulder Right  Result Date: 10/21/2019 CLINICAL DATA:  Recent fall with shoulder pain, initial encounter EXAM: RIGHT SHOULDER - 2+ VIEW COMPARISON:  None. FINDINGS: Degenerative changes of the acromioclavicular joint are seen. No acute fracture or dislocation is noted. No soft tissue abnormality is seen. Ribcage is within normal limits. IMPRESSION: Degenerative change without acute abnormality. Electronically Signed   By: Inez Catalina M.D.   On: 10/21/2019 22:45   CT Head Wo  Contrast  Result Date: 10/21/2019 CLINICAL DATA:  Fall.  Head injury.  History of lung cancer. EXAM: CT HEAD WITHOUT CONTRAST CT CERVICAL SPINE WITHOUT CONTRAST TECHNIQUE: Multidetector CT imaging of the head and cervical spine was performed following the standard protocol without intravenous contrast. Multiplanar CT image reconstructions of the cervical spine were also generated. COMPARISON:  CT head 03/05/2019 FINDINGS: CT HEAD FINDINGS Brain: Generalized atrophy unchanged. Mild white matter hypodensity bilaterally unchanged. Negative for acute infarct, hemorrhage, mass. Vascular: Negative for hyperdense vessel Skull: Negative Sinuses/Orbits: Paranasal sinuses clear.  Left cataract extraction. Other: None CT CERVICAL SPINE FINDINGS Alignment: 3 mm anterolisthesis C3-4 and C4-5 Skull base and vertebrae: Negative for fracture Soft tissues and spinal canal: Negative for mass or adenopathy in the neck. Atherosclerotic calcification carotid bifurcation bilaterally. Disc levels: Multilevel disc and facet degeneration throughout the cervical spine. Multilevel spinal stenosis throughout the cervical spine most prominent C5-6 and C6-7. Upper chest: Moderately large right pleural effusion and small left pleural effusion. Apical emphysema. Other: None IMPRESSION: 1. No acute intracranial abnormality 2. Negative for cervical spine fracture.  Cervical spondylosis 3. Moderately large right pleural effusion and mild left pleural effusion. Electronically Signed   By: Franchot Gallo M.D.   On: 10/21/2019 17:59   CT Cervical Spine Wo Contrast  Result Date: 10/21/2019 CLINICAL DATA:  Fall.  Head injury.  History of lung cancer. EXAM: CT HEAD WITHOUT CONTRAST CT CERVICAL SPINE WITHOUT CONTRAST TECHNIQUE: Multidetector CT imaging of the head and cervical spine was performed following the standard protocol without intravenous contrast. Multiplanar CT image reconstructions of the cervical spine were also generated. COMPARISON:  CT head 03/05/2019 FINDINGS: CT HEAD FINDINGS Brain: Generalized atrophy unchanged. Mild white matter hypodensity bilaterally unchanged. Negative for acute infarct, hemorrhage, mass. Vascular: Negative for hyperdense vessel Skull: Negative Sinuses/Orbits: Paranasal sinuses clear.  Left cataract extraction. Other: None CT CERVICAL SPINE FINDINGS Alignment: 3 mm anterolisthesis C3-4 and C4-5 Skull base and vertebrae: Negative for fracture Soft tissues and spinal canal: Negative for mass or adenopathy in the neck. Atherosclerotic calcification carotid bifurcation bilaterally. Disc levels: Multilevel disc and facet degeneration throughout the cervical spine. Multilevel spinal stenosis throughout the cervical spine most prominent C5-6 and C6-7. Upper chest: Moderately large right pleural effusion and small left pleural effusion. Apical emphysema. Other: None IMPRESSION: 1. No acute intracranial abnormality 2. Negative for cervical spine fracture.  Cervical spondylosis 3. Moderately large right pleural effusion and mild left pleural effusion. Electronically Signed   By: Franchot Gallo M.D.   On: 10/21/2019 17:59     Assessment/Plan 65 year old male with history of CAD s/p CABGx4, HFrEF EF 30-35% 3/21, paroxysmal atrial fib, HTN, DM2, COPD lung cancer in remission,, as well as history of AKI requiring temporary HD, presenting with worsening lower extremity edema and weight gain associated with orthopnea in spite of outpatient adjustment of home diuretic therapy   Anasarca   Acute on chronic combined systolic (congestive) and diastolic -Last EF 35 to 03% in March 2021 -IV Lasix -Not currently on beta-blockers or ACE/ARB.  On amiodarone -Daily weights, intake and output monitoring on salt and fluid restriction  Blisters bilateral feet -Likely related to tension from edema -Wound care    CAD (coronary artery disease), native coronary artery -No complaints of chest pain and EKG is nonacute -Continue  statins.  Not on aspirin    AF (paroxysmal atrial fibrillation) ([facility]) -Continue Eliquis.  Patient on amiodarone    Diabetes mellitus with complication ([facility]) -Sliding scale insulin coverage  COPD -Continue home inhalers      DVT prophylaxis: Eliquis Code Status: full code  Family Communication:  none  Disposition Plan: Back to previous home environment Consults called: none  Status:At the time of admission, it appears that the appropriate admission status for this patient is INPATIENT. This is judged to be reasonable and necessary in order to provide the required intensity of service to ensure the patient's safety given the presenting symptoms, physical exam findings, and initial radiographic and laboratory data in the context of their  Comorbid conditions.   Patient requires inpatient status due to high intensity of service, high risk for further deterioration and high frequency of surveillance required.   I certify that at the point of admission it is my clinical judgment that the patient will require inpatient hospital care spanning beyond [place] MD [facility]     10/22/2019, 2:20 AM

## 2019-10-23 ENCOUNTER — Ambulatory Visit: Admission: RE | Admit: 2019-10-23 | Payer: Medicare Other | Source: Ambulatory Visit

## 2019-10-23 DIAGNOSIS — R601 Generalized edema: Secondary | ICD-10-CM | POA: Diagnosis not present

## 2019-10-23 DIAGNOSIS — I5043 Acute on chronic combined systolic (congestive) and diastolic (congestive) heart failure: Secondary | ICD-10-CM

## 2019-10-23 DIAGNOSIS — M7989 Other specified soft tissue disorders: Secondary | ICD-10-CM

## 2019-10-23 DIAGNOSIS — E118 Type 2 diabetes mellitus with unspecified complications: Secondary | ICD-10-CM

## 2019-10-23 LAB — CBC
HCT: 29 % — ABNORMAL LOW (ref 39.0–52.0)
Hemoglobin: 9.3 g/dL — ABNORMAL LOW (ref 13.0–17.0)
MCH: 31.4 pg (ref 26.0–34.0)
MCHC: 32.1 g/dL (ref 30.0–36.0)
MCV: 98 fL (ref 80.0–100.0)
Platelets: 282 10*3/uL (ref 150–400)
RBC: 2.96 MIL/uL — ABNORMAL LOW (ref 4.22–5.81)
RDW: 23.7 % — ABNORMAL HIGH (ref 11.5–15.5)
WBC: 7.2 10*3/uL (ref 4.0–10.5)
nRBC: 0 % (ref 0.0–0.2)

## 2019-10-23 LAB — BASIC METABOLIC PANEL
Anion gap: 9 (ref 5–15)
BUN: 37 mg/dL — ABNORMAL HIGH (ref 8–23)
CO2: 31 mmol/L (ref 22–32)
Calcium: 8 mg/dL — ABNORMAL LOW (ref 8.9–10.3)
Chloride: 101 mmol/L (ref 98–111)
Creatinine, Ser: 1.81 mg/dL — ABNORMAL HIGH (ref 0.61–1.24)
GFR, Estimated: 39 mL/min — ABNORMAL LOW (ref >60)
Glucose, Bld: 60 mg/dL — ABNORMAL LOW (ref 70–99)
Potassium: 3.1 mmol/L — ABNORMAL LOW (ref 3.5–5.1)
Sodium: 141 mmol/L (ref 135–145)

## 2019-10-23 LAB — GLUCOSE, CAPILLARY
Glucose-Capillary: 91 mg/dL (ref 70–99)
Glucose-Capillary: 242 mg/dL — ABNORMAL HIGH (ref 70–99)
Glucose-Capillary: 152 mg/dL — ABNORMAL HIGH (ref 70–99)
Glucose-Capillary: 92 mg/dL (ref 70–99)

## 2019-10-23 LAB — HEMOGLOBIN A1C
Hgb A1c MFr Bld: 8.1 % — ABNORMAL HIGH (ref 4.8–5.6)
Mean Plasma Glucose: 186 mg/dL

## 2019-10-23 LAB — MAGNESIUM: Magnesium: 2.2 mg/dL (ref 1.7–2.4)

## 2019-10-23 MED ORDER — INSULIN ASPART 100 UNIT/ML ~~LOC~~ SOLN
5.0000 [IU] | Freq: Three times a day (TID) | SUBCUTANEOUS | Status: DC
  Administered 2019-10-23 – 2019-10-25 (×6): 5 [IU] via SUBCUTANEOUS
  Filled 2019-10-23 (×5): qty 1

## 2019-10-23 MED ORDER — ALBUMIN HUMAN 25 % IV SOLN
12.5000 g | Freq: Three times a day (TID) | INTRAVENOUS | Status: DC
  Administered 2019-10-23 – 2019-10-24 (×3): 12.5 g via INTRAVENOUS
  Filled 2019-10-23 (×5): qty 50

## 2019-10-23 MED ORDER — INSULIN DETEMIR 100 UNIT/ML ~~LOC~~ SOLN
4.0000 [IU] | Freq: Every day | SUBCUTANEOUS | Status: DC
  Administered 2019-10-23 – 2019-10-25 (×2): 4 [IU] via SUBCUTANEOUS
  Filled 2019-10-23 (×3): qty 0.04

## 2019-10-23 MED ORDER — POTASSIUM CHLORIDE CRYS ER 20 MEQ PO TBCR
40.0000 meq | EXTENDED_RELEASE_TABLET | Freq: Once | ORAL | Status: AC
  Administered 2019-10-23: 40 meq via ORAL
  Filled 2019-10-23: qty 2

## 2019-10-23 NOTE — Progress Notes (Signed)
Sepsis screen performed and is negative. Will continue to monitor. 

## 2019-10-23 NOTE — Progress Notes (Signed)
PROGRESS NOTE    GHAZI Koch  FOY:[id number] DOB: 1955/02/16 DOA: 10/21/2019 PCP: Tonia Ghent, MD    Assessment & Plan:   Active Problems:   Diabetes mellitus with complication (HCC)   CAD (coronary artery disease), native coronary artery   AF (paroxysmal atrial fibrillation) (HCC)   Acute on chronic combined systolic (congestive) and diastolic (congestive) heart failure (HCC)   Anasarca   Swelling of both lower extremities    Craig Koch is a 64 y.o. male with medical history significant for CAD s/p CABGx4, HFrEF EF 30-35% 3/21, paroxysmal atrial fib, HTN, DM2, COPD lung cancer in remission,, as well as history of AKI requiring temporary HD, who presents to the emergency room with worsening lower extremity edema and weight gain associated with orthopnea in spite of outpatient adjustment in his diuretic medication.  He denies chest pain, cough fever or chills.  Patient states the swelling in the lower extremity has affected his ability to ambulate because he feels like his legs are very heavy and plus he has had some blistering of his feet as a result.  Said he fell on the day of arrival suffering a skin tear of his right elbow and on his nasal bridge   Anasarca   Acute on chronic combined systolic (congestive) and diastolic -Last EF 35 to 76% in March 2021 -Not currently on beta-blockers or ACE/ARB.  On amiodarone --nephrology consulted PLAN: --IV lasix gtt, management by nephrology --IV albumin, per nephrology --cont home Aldactone -Daily weights, intake and output monitoring on salt and fluid restriction  Blisters bilateral feet -Likely related to tension from edema -Wound care and diuresis    CAD (coronary artery disease), native coronary artery -No complaints of chest pain and EKG is nonacute.  Not on aspirin --cont home statin and Zetia    AF (paroxysmal atrial fibrillation) (HCC) -Continue Eliquis.   --cont amiodarone --resume home metop     Diabetes mellitus with complication (HCC) --Levemir 4u daily --add meal-time 5u TID --SSI  COPD --stable   DVT prophylaxis: HM:CNOBSJG Code Status: Full code  Family Communication:  Status is: inpatient Dispo:   The patient is from: home Anticipated d/c is to: home Anticipated d/c date is: 2-3 days Patient currently is not medically stable to d/c due to: on IV diuresis, per nephrology   Subjective and Interval History:  Complained of pain in right arm and shoulder, but able to lift his right arm above his shoulder.  Making good amount of urine.  Eating well.   Objective: Vitals:   10/23/19 1616 10/23/19 2059 10/23/19 2308 10/24/19 0558  BP: 114/66 113/72 115/75 128/72  Pulse: 96 97 97 94  Resp: 20 20 18 18   Temp: (!) 97.5 F (36.4 C) (!) 97.5 F (36.4 C) 98 F (36.7 C) (!) 97.5 F (36.4 C)  TempSrc: Oral Oral Oral   SpO2: 98% 96% 98% 94%  Weight:      Height:        Intake/Output Summary (Last 24 hours) at 10/24/2019 0733 Last data filed at 10/24/2019 0600 Gross per 24 hour  Intake 779.01 ml  Output 2700 ml  Net -1920.99 ml   Filed Weights   10/21/19 1704  Weight: 99.3 kg    Examination:   Constitutional: NAD, AAOx3 HEENT: conjunctivae and lids normal, EOMI CV: No cyanosis.   RESP: clear, normal respiratory effort, on RA Extremities: still tense edema in BLE with erythema on both sides SKIN: warm, dry  Neuro: II - XII  grossly intact.   Psych: Normal mood and affect.  Appropriate judgement and reason    Data Reviewed: I have personally reviewed following labs and imaging studies  CBC: Recent Labs  Lab 10/17/19 0914 10/21/19 1731 10/22/19 1256 10/23/19 0413 10/24/19 0533  WBC 6.4 7.5 5.9 7.2 6.3  NEUTROABS 4.7  --   --   --   --   HGB 9.4* 10.0* 8.6* 9.3* 9.4*  HCT 29.2* 31.2* 26.6* 29.0* 28.6*  MCV 95 99.4 100.0 98.0 98.6  PLT 229 303 261 282 428   Basic Metabolic Panel: Recent Labs  Lab 10/17/19 0914 10/21/19 1731 10/22/19 1256  10/23/19 0413 10/24/19 0533  NA 137 139 139 141 142  K 3.6 2.8* 3.4* 3.1* 3.9  CL 99 99 104 101 102  CO2 20 28 23 31 31   GLUCOSE 129* 149* 347* 60* 67*  BUN 40* 39* 37* 37* 35*  CREATININE 2.08* 2.01* 1.79* 1.81* 1.76*  CALCIUM 8.5* 8.0* 7.5* 8.0* 8.5*  MG  --   --  1.9 2.2 2.0   GFR: Estimated Creatinine Clearance: 51.8 mL/min (A) (by C-G formula based on SCr of 1.76 mg/dL (H)). Liver Function Tests: Recent Labs  Lab 10/21/19 2202  AST 58*  ALT 39  ALKPHOS 321*  BILITOT 1.1  PROT 6.3*  ALBUMIN 2.4*   No results for input(s): LIPASE, AMYLASE in the last 168 hours. No results for input(s): AMMONIA in the last 168 hours. Coagulation Profile: No results for input(s): INR, PROTIME in the last 168 hours. Cardiac Enzymes: No results for input(s): CKTOTAL, CKMB, CKMBINDEX, TROPONINI in the last 168 hours. BNP (last 3 results) Recent Labs    08/15/19 1000  PROBNP 1,476.0*   HbA1C: Recent Labs    10/22/19 0640  HGBA1C 8.1*   CBG: Recent Labs  Lab 10/22/19 2118 10/23/19 0810 10/23/19 1141 10/23/19 1653 10/23/19 2137  GLUCAP 231* 91 242* 152* 92   Lipid Profile: No results for input(s): CHOL, HDL, LDLCALC, TRIG, CHOLHDL, LDLDIRECT in the last 72 hours. Thyroid Function Tests: No results for input(s): TSH, T4TOTAL, FREET4, T3FREE, THYROIDAB in the last 72 hours. Anemia Panel: No results for input(s): VITAMINB12, FOLATE, FERRITIN, TIBC, IRON, RETICCTPCT in the last 72 hours. Sepsis Labs: No results for input(s): PROCALCITON, LATICACIDVEN in the last 168 hours.  Recent Results (from the past 240 hour(s))  Respiratory Panel by RT PCR (Flu A&B, Covid) - Nasopharyngeal Swab     Status: None   Collection Time: 10/21/19  8:40 PM   Specimen: Nasopharyngeal Swab  Result Value Ref Range Status   SARS Coronavirus 2 by RT PCR NEGATIVE NEGATIVE Final    Comment: (NOTE) SARS-CoV-2 target nucleic acids are NOT DETECTED.  The SARS-CoV-2 RNA is generally detectable in upper  respiratoy specimens during the acute phase of infection. The lowest concentration of SARS-CoV-2 viral copies this assay can detect is 131 copies/mL. A negative result does not preclude SARS-Cov-2 infection and should not be used as the sole basis for treatment or other patient management decisions. A negative result may occur with  improper specimen collection/handling, submission of specimen other than nasopharyngeal swab, presence of viral mutation(s) within the areas targeted by this assay, and inadequate number of viral copies (<131 copies/mL). A negative result must be combined with clinical observations, patient history, and epidemiological information. The expected result is Negative.  Fact Sheet for Patients:  PinkCheek.be  Fact Sheet for Healthcare Providers:  GravelBags.it  This test is no t yet approved or cleared by  the [employer] and  has been authorized for detection and/or diagnosis of SARS-CoV-2 by FDA under an Emergency Use Authorization (EUA). This EUA will remain  in effect (meaning this test can be used) for the duration of the COVID-19 declaration under Section 564(b)(1) of the Act, 21 U.S.C. section 360bbb-3(b)(1), unless the authorization is terminated or revoked sooner.     Influenza A by PCR NEGATIVE NEGATIVE Final   Influenza B by PCR NEGATIVE NEGATIVE Final    Comment: (NOTE) The Xpert Xpress SARS-CoV-2/FLU/RSV assay is intended as an aid in  the diagnosis of influenza from Nasopharyngeal swab specimens and  should not be used as a sole basis for treatment. Nasal washings and  aspirates are unacceptable for Xpert Xpress SARS-CoV-2/FLU/RSV  testing.  Fact Sheet for Patients: PinkCheek.be  Fact Sheet for Healthcare Providers: GravelBags.it  This test is not yet approved or cleared by the [place] FDA and  has been  authorized for detection and/or diagnosis of SARS-CoV-2 by  FDA under an Emergency Use Authorization (EUA). This EUA will remain  in effect (meaning this test can be used) for the duration of the  Covid-19 declaration under Section 564(b)(1) of the Act, 21  U.S.C. section 360bbb-3(b)(1), unless the authorization is  terminated or revoked. Performed at [facility], [address] [place], [place] [address]       Radiology Studies: No results found.   Scheduled Meds: . amiodarone  200 mg Oral Daily  . apixaban  5 mg Oral BID  . insulin aspart  0-15 Units Subcutaneous TID WC  . insulin aspart  5 Units Subcutaneous TID WC  . insulin detemir  4 Units Subcutaneous Daily  . sodium chloride flush  3 mL Intravenous Q12H  . spironolactone  25 mg Oral Daily   Continuous Infusions: . sodium chloride    . albumin human 12.5 g (10/23/19 2217)  . furosemide (LASIX) infusion 5 mg/hr (10/23/19 1700)     LOS: 2 days     Enzo Bi, MD [facility] If 7PM-7AM, please contact night-coverage 10/24/2019, 7:33 AM

## 2019-10-23 NOTE — Progress Notes (Signed)
[facility]  ROUNDING NOTE   Subjective:  Craig Koch 64 years old gentleman with a history of CKD stage IIIb, follows up with Dr. Holley Raring as outpatient, was sent to the ED for bilateral lower extremity edema, volume overload, concern for cardiorenal syndrome.   He also has past medical history of diabetes, hypertension, high lipids, and CHF.  Patient is continued on IV Lasix infusion as well as IV albumin.  He is tolerating it well. Urine output 2340 yesterday.  Objective:  Vital signs in last 24 hours:  Temp:  [96.6 F (35.9 C)-98.7 F (37.1 C)] 97.5 F (36.4 C) (10/20 0947) Pulse Rate:  [82-98] 89 (10/20 0947) Resp:  [16-24] 18 (10/20 0947) BP: (100-126)/(66-91) 108/71 (10/20 0947) SpO2:  [93 %-98 %] 97 % (10/20 0947)  Weight change:  Filed Weights   10/21/19 1704  Weight: 99.3 kg    Intake/Output: I/O last 3 completed shifts: In: 120 [P.O.:120] Out: 2340 [Urine:2340]   Intake/Output this shift:  Total I/O In: 240 [P.O.:240] Out: 1000 [Urine:1000]  Physical Exam: General:  Resting in bed in no acute distress  Head: Normocephalic, atraumatic. Moist oral mucosal membranes  Eyes: Anicteric  Neck: Supple, trachea at  midline  Lungs:   Diminished, fine crackles at the bases,normal and symmetrical respiratory effort,able to talk in full sentences  Heart: Regular rate and rhythm,HR in 90's  Abdomen:  Soft, nontender, lower abdominal swelling +  Extremities:  3+ peripheral edema.  Neurologic: Oriented x3 ,  moving all four extremities  Skin: Bilateral lower legs with blisters,erythematous, laceration on nose, Rt.elbow with dressing    Basic Metabolic Panel: Recent Labs  Lab 10/17/19 0914 10/17/19 0914 10/21/19 1731 10/22/19 1256 10/23/19 0413  NA 137  --  139 139 141  K 3.6  --  2.8* 3.4* 3.1*  CL 99  --  99 104 101  CO2 20  --  28 23 31   GLUCOSE 129*  --  149* 347* 60*  BUN 40*  --  39* 37* 37*  CREATININE 2.08*  --  2.01* 1.79* 1.81*   CALCIUM 8.5*   < > 8.0* 7.5* 8.0*  MG  --   --   --  1.9 2.2   < > = values in this interval not displayed.    Liver Function Tests: Recent Labs  Lab 10/21/19 2202  AST 58*  ALT 39  ALKPHOS 321*  BILITOT 1.1  PROT 6.3*  ALBUMIN 2.4*   No results for input(s): LIPASE, AMYLASE in the last 168 hours. No results for input(s): AMMONIA in the last 168 hours.  CBC: Recent Labs  Lab 10/17/19 0914 10/21/19 1731 10/22/19 1256 10/23/19 0413  WBC 6.4 7.5 5.9 7.2  NEUTROABS 4.7  --   --   --   HGB 9.4* 10.0* 8.6* 9.3*  HCT 29.2* 31.2* 26.6* 29.0*  MCV 95 99.4 100.0 98.0  PLT 229 303 261 282    Cardiac Enzymes: No results for input(s): CKTOTAL, CKMB, CKMBINDEX, TROPONINI in the last 168 hours.  BNP: Invalid input(s): POCBNP  CBG: Recent Labs  Lab 10/22/19 1141 10/22/19 1647 10/22/19 2118 10/23/19 0810 10/23/19 1141  GLUCAP 350* 347* 231* 91 242*    Microbiology: Results for orders placed or performed during the hospital encounter of 10/21/19  Respiratory Panel by RT PCR (Flu A&B, Covid) - Nasopharyngeal Swab     Status: None   Collection Time: 10/21/19  8:40 PM   Specimen: Nasopharyngeal Swab  Result Value Ref Range Status  SARS Coronavirus 2 by RT PCR NEGATIVE NEGATIVE Final    Comment: (NOTE) SARS-CoV-2 target nucleic acids are NOT DETECTED.  The SARS-CoV-2 RNA is generally detectable in upper respiratoy specimens during the acute phase of infection. The lowest concentration of SARS-CoV-2 viral copies this assay can detect is 131 copies/mL. A negative result does not preclude SARS-Cov-2 infection and should not be used as the sole basis for treatment or other patient management decisions. A negative result may occur with  improper specimen collection/handling, submission of specimen other than nasopharyngeal swab, presence of viral mutation(s) within the areas targeted by this assay, and inadequate number of viral copies (<131 copies/mL). A negative result  must be combined with clinical observations, patient history, and epidemiological information. The expected result is Negative.  Fact Sheet for Patients:  PinkCheek.be  Fact Sheet for Healthcare Providers:  GravelBags.it  This test is no t yet approved or cleared by the [place] FDA and  has been authorized for detection and/or diagnosis of SARS-CoV-2 by FDA under an Emergency Use Authorization (EUA). This EUA will remain  in effect (meaning this test can be used) for the duration of the COVID-19 declaration under Section 564(b)(1) of the Act, 21 U.S.C. section 360bbb-3(b)(1), unless the authorization is terminated or revoked sooner.     Influenza A by PCR NEGATIVE NEGATIVE Final   Influenza B by PCR NEGATIVE NEGATIVE Final    Comment: (NOTE) The Xpert Xpress SARS-CoV-2/FLU/RSV assay is intended as an aid in  the diagnosis of influenza from Nasopharyngeal swab specimens and  should not be used as a sole basis for treatment. Nasal washings and  aspirates are unacceptable for Xpert Xpress SARS-CoV-2/FLU/RSV  testing.  Fact Sheet for Patients: PinkCheek.be  Fact Sheet for Healthcare Providers: GravelBags.it  This test is not yet approved or cleared by the [place] FDA and  has been authorized for detection and/or diagnosis of SARS-CoV-2 by  FDA under an Emergency Use Authorization (EUA). This EUA will remain  in effect (meaning this test can be used) for the duration of the  Covid-19 declaration under Section 564(b)(1) of the Act, 21  U.S.C. section 360bbb-3(b)(1), unless the authorization is  terminated or revoked. Performed at [facility], [address] [place], [place] [address]     Coagulation Studies: No results for input(s): LABPROT, INR in the last 72 hours.  Urinalysis: No results for input(s): COLORURINE, LABSPEC, PHURINE,  GLUCOSEU, HGBUR, BILIRUBINUR, KETONESUR, PROTEINUR, UROBILINOGEN, NITRITE, LEUKOCYTESUR in the last 72 hours.  Invalid input(s): APPERANCEUR    Imaging: DG Chest 2 View  Result Date: 10/21/2019 CLINICAL DATA:  Bilateral lower extremity swelling and weight gain. EXAM: CHEST - 2 VIEW COMPARISON:  July 29, 2019 FINDINGS: Multiple sternal wires and vascular clips are present. Mild, diffusely increased interstitial lung markings are noted. Mild to moderate severity infiltrates are seen within the mid and lower left lung. This is mildly increased in severity when compared to the prior study. The infiltrates seen within the right lung on the prior study are markedly decreased in severity when compared to the prior exam, with a very small amount of residual right basilar atelectasis and/or infiltrate seen. Small, stable bilateral pleural effusions are present. No pneumothorax is identified. The cardiac silhouette is mildly enlarged. The visualized skeletal structures are unremarkable. IMPRESSION: 1. Mild to moderate severity left lower lobe infiltrate, mildly increased in severity when compared to the prior study. 2. Very mild residual right basilar atelectasis and/or infiltrate. 3. Small, stable bilateral pleural effusions.  Electronically Signed   By: Virgina Norfolk M.D.   On: 10/21/2019 21:11   DG Shoulder Right  Result Date: 10/21/2019 CLINICAL DATA:  Recent fall with shoulder pain, initial encounter EXAM: RIGHT SHOULDER - 2+ VIEW COMPARISON:  None. FINDINGS: Degenerative changes of the acromioclavicular joint are seen. No acute fracture or dislocation is noted. No soft tissue abnormality is seen. Ribcage is within normal limits. IMPRESSION: Degenerative change without acute abnormality. Electronically Signed   By: Inez Catalina M.D.   On: 10/21/2019 22:45   CT Head Wo Contrast  Result Date: 10/21/2019 CLINICAL DATA:  Fall.  Head injury.  History of lung cancer. EXAM: CT HEAD WITHOUT CONTRAST CT  CERVICAL SPINE WITHOUT CONTRAST TECHNIQUE: Multidetector CT imaging of the head and cervical spine was performed following the standard protocol without intravenous contrast. Multiplanar CT image reconstructions of the cervical spine were also generated. COMPARISON:  CT head 03/05/2019 FINDINGS: CT HEAD FINDINGS Brain: Generalized atrophy unchanged. Mild white matter hypodensity bilaterally unchanged. Negative for acute infarct, hemorrhage, mass. Vascular: Negative for hyperdense vessel Skull: Negative Sinuses/Orbits: Paranasal sinuses clear.  Left cataract extraction. Other: None CT CERVICAL SPINE FINDINGS Alignment: 3 mm anterolisthesis C3-4 and C4-5 Skull base and vertebrae: Negative for fracture Soft tissues and spinal canal: Negative for mass or adenopathy in the neck. Atherosclerotic calcification carotid bifurcation bilaterally. Disc levels: Multilevel disc and facet degeneration throughout the cervical spine. Multilevel spinal stenosis throughout the cervical spine most prominent C5-6 and C6-7. Upper chest: Moderately large right pleural effusion and small left pleural effusion. Apical emphysema. Other: None IMPRESSION: 1. No acute intracranial abnormality 2. Negative for cervical spine fracture.  Cervical spondylosis 3. Moderately large right pleural effusion and mild left pleural effusion. Electronically Signed   By: Franchot Gallo M.D.   On: 10/21/2019 17:59   CT Cervical Spine Wo Contrast  Result Date: 10/21/2019 CLINICAL DATA:  Fall.  Head injury.  History of lung cancer. EXAM: CT HEAD WITHOUT CONTRAST CT CERVICAL SPINE WITHOUT CONTRAST TECHNIQUE: Multidetector CT imaging of the head and cervical spine was performed following the standard protocol without intravenous contrast. Multiplanar CT image reconstructions of the cervical spine were also generated. COMPARISON:  CT head 03/05/2019 FINDINGS: CT HEAD FINDINGS Brain: Generalized atrophy unchanged. Mild white matter hypodensity bilaterally  unchanged. Negative for acute infarct, hemorrhage, mass. Vascular: Negative for hyperdense vessel Skull: Negative Sinuses/Orbits: Paranasal sinuses clear.  Left cataract extraction. Other: None CT CERVICAL SPINE FINDINGS Alignment: 3 mm anterolisthesis C3-4 and C4-5 Skull base and vertebrae: Negative for fracture Soft tissues and spinal canal: Negative for mass or adenopathy in the neck. Atherosclerotic calcification carotid bifurcation bilaterally. Disc levels: Multilevel disc and facet degeneration throughout the cervical spine. Multilevel spinal stenosis throughout the cervical spine most prominent C5-6 and C6-7. Upper chest: Moderately large right pleural effusion and small left pleural effusion. Apical emphysema. Other: None IMPRESSION: 1. No acute intracranial abnormality 2. Negative for cervical spine fracture.  Cervical spondylosis 3. Moderately large right pleural effusion and mild left pleural effusion. Electronically Signed   By: Franchot Gallo M.D.   On: 10/21/2019 17:59     Medications:   . sodium chloride    . albumin human    . furosemide (LASIX) infusion 5 mg/hr (10/22/19 1300)   . amiodarone  200 mg Oral Daily  . apixaban  5 mg Oral BID  . insulin aspart  0-15 Units Subcutaneous TID WC  . insulin aspart  5 Units Subcutaneous TID WC  . insulin detemir  4  Units Subcutaneous Daily  . sodium chloride flush  3 mL Intravenous Q12H  . spironolactone  25 mg Oral Daily   sodium chloride, acetaminophen, ondansetron (ZOFRAN) IV, sodium chloride flush  Assessment/ Plan:  Craig Koch is a 64 y.o.  male gentleman with a history of CKD stage IIIb, follows up with Dr. Holley Raring as outpatient, was sent to the ED for bilateral lower extremity edema, volume overload, concern for cardiorenal syndrome.   He also has past medical history of diabetes, hypertension, high lipids, and CHF. 2D echo from March 15, 2019: LVEF 35 to 40%, moderately decreased LV function, grade 2 diastolic dysfunction,  moderately elevated pulmonary artery systolic pressure  # Acute on Chronic systolic CHF, volume overload #Pulmonary hypertension # CKD Stage 3b  Creatinine 2.01/GFR 34 on 10/21/2019 BNP : 2,099  -Appears fluid overloaded on assessment -Has 3+ bilateral lower extremity edema, dyspnea on exertion -Continue IV albumin and Lasix IV infusion 5 mg/h -We will continue monitoring closely  # Hypokalemia Getting supplemented with intravenous and Oral Potassium  #Diabetes with CKD Blood glucose readings within acceptable range On sliding scale Aspart and Insulin Levemir  #Hypertension -Normotensive today -Lasix infusion  #Anemia of CKD Lab Results  Component Value Date   HGB 9.3 (L) 10/23/2019    Oral iron supplements as outpatient   LOS: 1 Eyden Dobie 10/20/20214:07 PM

## 2019-10-23 NOTE — Progress Notes (Signed)
No tele box available. House supervisor made aware. IV Albumin done infusing.

## 2019-10-23 NOTE — Plan of Care (Signed)
Pt Craig Koch. Calm and cooperative and able to voice his needs. Generalized swelling noted especially to BLE. BIL LE redness/cellulitis noted. Bursted blisters noted to Bil swollen feet and dressings applied. Dressing to Right FA applied over skin tear. Pt on Lasix drip at 5mg /hr. Some wheezing noted on exertion, then subside when pt calm. Pt incontinent of urine. Condom cath in place. Pt takes pills whole. Safety measures in place. Will continue to monitor.   Problem: Education: Goal: Knowledge of General Education information will improve Description: Including pain rating scale, medication(s)/side effects and non-pharmacologic comfort measures Outcome: Progressing   Problem: Health Behavior/Discharge Planning: Goal: Ability to manage health-related needs will improve Outcome: Progressing   Problem: Clinical Measurements: Goal: Ability to maintain clinical measurements within normal limits will improve Outcome: Progressing Goal: Will remain free from infection Outcome: Progressing Goal: Diagnostic test results will improve Outcome: Progressing Goal: Respiratory complications will improve Outcome: Progressing Goal: Cardiovascular complication will be avoided Outcome: Progressing   Problem: Activity: Goal: Risk for activity intolerance will decrease Outcome: Progressing   Problem: Nutrition: Goal: Adequate nutrition will be maintained Outcome: Progressing   Problem: Coping: Goal: Level of anxiety will decrease Outcome: Progressing   Problem: Elimination: Goal: Will not experience complications related to bowel motility Outcome: Progressing Goal: Will not experience complications related to urinary retention Outcome: Progressing   Problem: Pain Managment: Goal: General experience of comfort will improve Outcome: Progressing   Problem: Safety: Goal: Ability to remain free from injury will improve Outcome: Progressing   Problem: Skin Integrity: Goal: Risk for impaired skin  integrity will decrease Outcome: Progressing

## 2019-10-23 NOTE — Progress Notes (Addendum)
Inpatient Diabetes Program Recommendations  AACE/ADA: New Consensus Statement on Inpatient Glycemic Control  Target Ranges:  Prepandial:   less than 140 mg/dL      Peak postprandial:   less than 180 mg/dL (1-2 hours)      Critically ill patients:  140 - 180 mg/dL   Results for Craig Koch, Craig Koch (MRN [id number]) as of 10/23/2019 08:32  Ref. Range 10/22/2019 08:11 10/22/2019 11:41 10/22/2019 16:47 10/22/2019 21:18 10/23/2019 08:10  Glucose-Capillary Latest Ref Range: 70 - 99 mg/dL 226 (H)  Novolog 5 units @ 10:53 350 (H)  Novolog 11 units @14 :48 347 (H)  Novolog 11 units @18 :51 231 (H)  Novolog 2 units@23 :18 91   Review of Glycemic Control  Diabetes history: DM2 Outpatient Diabetes medications: Levemir 4-5 units QHS (if glucose 190 mg/dl or higher),Humalog 10 units TID with meals plus additional units if needed based on glucose  Current orders for Inpatient glycemic control: Levemir 20 units QHS, Novolog 0-15 units TID with meals, Novolog 0-5 units QHS  Inpatient Diabetes Program Recommendations:    Insulin: Levemir was NOT GIVEN last night (charted as medication not available). Patient states he is only taking 4-5 units of Levemir QHS if glucose over 190 mg/dl as an outpatient. Please consider decreasing Levemir to 4 units and change frequency to daily (to be started at 10 am today) and ordering Novolog 5 units TID with meals for meal coverage if patient eats at least 50% of meals.  NOTE: Noted Levemir 20 units QHS and Humalog 20 units TID listed on current home medication list. Per note by S. Owens Shark, RPh on 10/11/19 patient's regimen was noted to be Levemir 20 units QHS and Novolog 0-8 units TID. Called patient over the phone to clarify insulin regimen. Patient states that he mainly takes Humalog and takes the Levemir if glucose is over 190 mg/dl. Patient is taking Humalog 10 units TID with meals plus additional units if needed for glucose and Levemir 4-5 units QHS if glucose over 190  mg/dl. Patient was having lows so insulin has been adjusted. Patient reports that glucose has been running fairly well lately and he has not needed much Levemir. Noted prior A1C was 7.2% on 05/14/19. Discussed current A1C 8.1% on 10/22/19 which indicates an average glucose of 186 mg/dl.  Patient states he is checking glucose 4 times a day. Asked patient to keep a written log of glucose readings and exact dosages of insulin taken. Asked to take log to follow up appointments so PCP or RPh can use the information to continue to help him make adjustments with insulin regimen.   Discussed current insulin orders and informed patient that it would be requested that some insulin changes be made. Patient verbalized understanding of information discussed and states that he has no questions at this time related to DM.  Thanks, Barnie Alderman, RN, MSN, CDE Diabetes Coordinator Inpatient Diabetes Program [phone] (Team Pager from 8am to 5pm)

## 2019-10-23 NOTE — Progress Notes (Signed)
Mobility Specialist - Progress Note   10/23/19 1200  Mobility  Activity Stood at bedside;Ambulated in room  Range of Motion/Exercises Right leg;Left leg (straight leg raises, hip isometrics, seated/standing march)  Level of Assistance Modified independent, requires aide device or extra time  Assistive Device Front wheel walker  Distance Ambulated (ft) 6 ft  Mobility Response Tolerated well  Mobility performed by Mobility specialist  $Mobility charge 1 Mobility    Pre-mobility: 97 HR, 92% SpO2 Post-mobility: 103 HR, 90% SpO2   Pt was lying in bed upon arrival. Pt agreed to session. Pt c/o pain in R/L arm, stating that his R arm was a "7/10" and L arm was a "4/10". Noted swelling in extremities. Pt was able to get EOB modI to perform seated exercises: straight leg raises, hip isometrics, and seated march. Pt progressed to standing with RW. Pt ambulated 3' forward/backward with no LOB. Pt stated "it's been harder to pick his feet up d/t fluid". No heavy breathing noted, pt denied SOB. At the end of session, pt stated that his "pain had gone down and that it's usually worse when I havn't been moving around all day". Pt was encouraged to continue exercises in between sessions. Overall, pt tolerated session well. Pt was left EOB with all needs in reach and lunch tray placed in front of him.    Kathee Delton Mobility Specialist 10/23/19, 12:33 PM

## 2019-10-24 DIAGNOSIS — I2510 Atherosclerotic heart disease of native coronary artery without angina pectoris: Secondary | ICD-10-CM

## 2019-10-24 DIAGNOSIS — I5043 Acute on chronic combined systolic (congestive) and diastolic (congestive) heart failure: Secondary | ICD-10-CM | POA: Diagnosis not present

## 2019-10-24 DIAGNOSIS — I130 Hypertensive heart and chronic kidney disease with heart failure and stage 1 through stage 4 chronic kidney disease, or unspecified chronic kidney disease: Principal | ICD-10-CM

## 2019-10-24 DIAGNOSIS — S0121XA Laceration without foreign body of nose, initial encounter: Secondary | ICD-10-CM

## 2019-10-24 DIAGNOSIS — R601 Generalized edema: Secondary | ICD-10-CM | POA: Diagnosis not present

## 2019-10-24 DIAGNOSIS — I480 Paroxysmal atrial fibrillation: Secondary | ICD-10-CM

## 2019-10-24 DIAGNOSIS — I1310 Hypertensive heart and chronic kidney disease without heart failure, with stage 1 through stage 4 chronic kidney disease, or unspecified chronic kidney disease: Secondary | ICD-10-CM

## 2019-10-24 LAB — GLUCOSE, CAPILLARY
Glucose-Capillary: 63 mg/dL — ABNORMAL LOW (ref 70–99)
Glucose-Capillary: 79 mg/dL (ref 70–99)
Glucose-Capillary: 275 mg/dL — ABNORMAL HIGH (ref 70–99)
Glucose-Capillary: 254 mg/dL — ABNORMAL HIGH (ref 70–99)
Glucose-Capillary: 172 mg/dL — ABNORMAL HIGH (ref 70–99)

## 2019-10-24 LAB — CBC
HCT: 28.6 % — ABNORMAL LOW (ref 39.0–52.0)
Hemoglobin: 9.4 g/dL — ABNORMAL LOW (ref 13.0–17.0)
MCH: 32.4 pg (ref 26.0–34.0)
MCHC: 32.9 g/dL (ref 30.0–36.0)
MCV: 98.6 fL (ref 80.0–100.0)
Platelets: 277 10*3/uL (ref 150–400)
RBC: 2.9 MIL/uL — ABNORMAL LOW (ref 4.22–5.81)
RDW: 23.6 % — ABNORMAL HIGH (ref 11.5–15.5)
WBC: 6.3 10*3/uL (ref 4.0–10.5)
nRBC: 0 % (ref 0.0–0.2)

## 2019-10-24 LAB — BASIC METABOLIC PANEL
Anion gap: 9 (ref 5–15)
BUN: 35 mg/dL — ABNORMAL HIGH (ref 8–23)
CO2: 31 mmol/L (ref 22–32)
Calcium: 8.5 mg/dL — ABNORMAL LOW (ref 8.9–10.3)
Chloride: 102 mmol/L (ref 98–111)
Creatinine, Ser: 1.76 mg/dL — ABNORMAL HIGH (ref 0.61–1.24)
GFR, Estimated: 40 mL/min — ABNORMAL LOW (ref >60)
Glucose, Bld: 67 mg/dL — ABNORMAL LOW (ref 70–99)
Potassium: 3.9 mmol/L (ref 3.5–5.1)
Sodium: 142 mmol/L (ref 135–145)

## 2019-10-24 LAB — MAGNESIUM: Magnesium: 2 mg/dL (ref 1.7–2.4)

## 2019-10-24 MED ORDER — METOPROLOL SUCCINATE ER 25 MG PO TB24
12.5000 mg | ORAL_TABLET | Freq: Every day | ORAL | Status: DC
  Administered 2019-10-24 – 2019-10-28 (×5): 12.5 mg via ORAL
  Filled 2019-10-24 (×5): qty 1

## 2019-10-24 MED ORDER — DULOXETINE HCL 30 MG PO CPEP
30.0000 mg | ORAL_CAPSULE | Freq: Every day | ORAL | Status: DC
  Administered 2019-10-24 – 2019-10-28 (×5): 30 mg via ORAL
  Filled 2019-10-24 (×5): qty 1

## 2019-10-24 MED ORDER — ALBUMIN HUMAN 25 % IV SOLN
12.5000 g | Freq: Two times a day (BID) | INTRAVENOUS | Status: DC
  Administered 2019-10-24 – 2019-10-27 (×6): 12.5 g via INTRAVENOUS
  Filled 2019-10-24 (×7): qty 50

## 2019-10-24 MED ORDER — ROSUVASTATIN CALCIUM 10 MG PO TABS
10.0000 mg | ORAL_TABLET | Freq: Every day | ORAL | Status: DC
  Administered 2019-10-24 – 2019-10-28 (×5): 10 mg via ORAL
  Filled 2019-10-24 (×5): qty 1

## 2019-10-24 MED ORDER — EZETIMIBE 10 MG PO TABS
10.0000 mg | ORAL_TABLET | Freq: Every day | ORAL | Status: DC
  Administered 2019-10-24 – 2019-10-28 (×5): 10 mg via ORAL
  Filled 2019-10-24 (×5): qty 1

## 2019-10-24 NOTE — Progress Notes (Addendum)
PROGRESS NOTE    Craig Koch  WCH:[id number] DOB: [birth year] DOA: 10/21/2019 PCP: Tonia Ghent, MD   Brief Narrative:  Craig Dawn Gerringeris a 64 y.o.malewith medical history significant forCAD s/p CABGx4, HFrEFEF 30-35% 3/21,paroxysmalatrial fib, HTN, DM2,COPDlung cancerin remission,, as well as history of AKI requiring temporary HD, who presents to the emergency room with worsening lower extremity edema and weight gain associated with orthopnea in spite of outpatient adjustment in his diuretic medication, found to have AKI with CKD and was started on Lasix infusion by nephrology. History of fall at home a day before admission resulted in some lacerations in right elbow and nasal bridge.  Subjective: Patient was seen and examined during morning rounds.  No new complaint.  Sitting at the site of bed.  Stating that his lower extremity edema seems improving.  Good urinary output.  Assessment & Plan:   Active Problems:   Diabetes mellitus with complication (HCC)   CAD (coronary artery disease), native coronary artery   AF (paroxysmal atrial fibrillation) (HCC)   Acute on chronic combined systolic (congestive) and diastolic (congestive) heart failure (HCC)   Anasarca   Swelling of both lower extremities  Acute on chronic combined systolic and diastolic heart failure.  EF was 35 to 40% in March 2021.  Clinically admitted for anasarca.  He was started on Lasix infusion along with albumin by nephrology.  Good urinary output but no weight recording since 10/21/2019 -Continue Lasix infusion. -Continue with albumin IV. -Continue with home Aldactone. -Continue with daily BMP and weight. -Strict intake and output.  AKI with CKD stage IIIa.  Concern of cardiorenal on admission.  Creatinine improving with diuresis. -Continue to monitor -Avoid nephrotoxins  Blisters bilateral feet -Likely related to tension from edema -Wound care and diuresis  CAD (coronary artery disease),  native coronary artery -No complaints of chest pain and EKG is nonacute.  Not on aspirin --cont home statin and Zetia  AF (paroxysmal atrial fibrillation) (HCC) -Continue Eliquis.  -Continue amiodarone -Continue home metop  Diabetes mellitus with complication ([place]). Patient had 1 episode of becoming hypoglycemic this morning with CBG of 63 which improved to 79 with orange juice. -Holding insulin in the morning. -Continue Levemir 4u daily-with close monitoring of blood glucose -SSI -Continue with mealtime coverage with close monitoring of blood glucose.  COPD. -stable  Objective: Vitals:   10/24/19 0911 10/24/19 0913 10/24/19 1154 10/24/19 1630  BP:   110/65 118/75  Pulse:  100 95 95  Resp:   20 (!) 24  Temp:   97.8 F (36.6 C) 97.6 F (36.4 C)  TempSrc:   Oral   SpO2:   96% 95%  Weight: 98.2 kg     Height:        Intake/Output Summary (Last 24 hours) at 10/24/2019 1631 Last data filed at 10/24/2019 1427 Gross per 24 hour  Intake 1379.01 ml  Output 2275 ml  Net -895.99 ml   Filed Weights   10/21/19 1704 10/24/19 0911  Weight: 99.3 kg 98.2 kg    Examination:  General exam: Appears calm and comfortable  Respiratory system: Clear to auscultation. Respiratory effort normal. Cardiovascular system: S1 & S2 heard, RRR.  Gastrointestinal system: Soft, nontender, nondistended, bowel sounds positive. Central nervous system: Alert and oriented. No focal neurological deficits. Extremities: 3+ LE edema,  pulses intact and symmetrical. Psychiatry: Judgement and insight appear normal. Mood & affect appropriate.    DVT prophylaxis: Eliquis Code Status: Full Family Communication: Discussed with patient Disposition Plan:  Status  is: Inpatient  Remains inpatient appropriate because:Inpatient level of care appropriate due to severity of illness   Dispo: The patient is from: Home              Anticipated d/c is to: Home              Anticipated d/c date is: > 3  days              Patient currently is not medically stable to d/c.  Patient will need longer IV diuresis to achieve euvolemia.  Consultants:   Nephrology  Procedures:  Antimicrobials:   Data Reviewed: I have personally reviewed following labs and imaging studies  CBC: Recent Labs  Lab 10/21/19 1731 10/22/19 1256 10/23/19 0413 10/24/19 0533  WBC 7.5 5.9 7.2 6.3  HGB 10.0* 8.6* 9.3* 9.4*  HCT 31.2* 26.6* 29.0* 28.6*  MCV 99.4 100.0 98.0 98.6  PLT 303 261 282 563   Basic Metabolic Panel: Recent Labs  Lab 10/21/19 1731 10/22/19 1256 10/23/19 0413 10/24/19 0533  NA 139 139 141 142  K 2.8* 3.4* 3.1* 3.9  CL 99 104 101 102  CO2 28 23 31 31   GLUCOSE 149* 347* 60* 67*  BUN 39* 37* 37* 35*  CREATININE 2.01* 1.79* 1.81* 1.76*  CALCIUM 8.0* 7.5* 8.0* 8.5*  MG  --  1.9 2.2 2.0   GFR: Estimated Creatinine Clearance: 51.5 mL/min (A) (by C-G formula based on SCr of 1.76 mg/dL (H)). Liver Function Tests: Recent Labs  Lab 10/21/19 2202  AST 58*  ALT 39  ALKPHOS 321*  BILITOT 1.1  PROT 6.3*  ALBUMIN 2.4*   No results for input(s): LIPASE, AMYLASE in the last 168 hours. No results for input(s): AMMONIA in the last 168 hours. Coagulation Profile: No results for input(s): INR, PROTIME in the last 168 hours. Cardiac Enzymes: No results for input(s): CKTOTAL, CKMB, CKMBINDEX, TROPONINI in the last 168 hours. BNP (last 3 results) Recent Labs    08/15/19 1000  PROBNP 1,476.0*   HbA1C: Recent Labs    10/22/19 0640  HGBA1C 8.1*   CBG: Recent Labs  Lab 10/23/19 1653 10/23/19 2137 10/24/19 0753 10/24/19 0818 10/24/19 1145  GLUCAP 152* 92 63* 79 275*   Lipid Profile: No results for input(s): CHOL, HDL, LDLCALC, TRIG, CHOLHDL, LDLDIRECT in the last 72 hours. Thyroid Function Tests: No results for input(s): TSH, T4TOTAL, FREET4, T3FREE, THYROIDAB in the last 72 hours. Anemia Panel: No results for input(s): VITAMINB12, FOLATE, FERRITIN, TIBC, IRON, RETICCTPCT in  the last 72 hours. Sepsis Labs: No results for input(s): PROCALCITON, LATICACIDVEN in the last 168 hours.  Recent Results (from the past 240 hour(s))  Respiratory Panel by RT PCR (Flu A&B, Covid) - Nasopharyngeal Swab     Status: None   Collection Time: 10/21/19  8:40 PM   Specimen: Nasopharyngeal Swab  Result Value Ref Range Status   SARS Coronavirus 2 by RT PCR NEGATIVE NEGATIVE Final    Comment: (NOTE) SARS-CoV-2 target nucleic acids are NOT DETECTED.  The SARS-CoV-2 RNA is generally detectable in upper respiratoy specimens during the acute phase of infection. The lowest concentration of SARS-CoV-2 viral copies this assay can detect is 131 copies/mL. A negative result does not preclude SARS-Cov-2 infection and should not be used as the sole basis for treatment or other patient management decisions. A negative result may occur with  improper specimen collection/handling, submission of specimen other than nasopharyngeal swab, presence of viral mutation(s) within the areas targeted by this assay, and  inadequate number of viral copies (<131 copies/mL). A negative result must be combined with clinical observations, patient history, and epidemiological information. The expected result is Negative.  Fact Sheet for Patients:  PinkCheek.be  Fact Sheet for Healthcare Providers:  GravelBags.it  This test is no t yet approved or cleared by the [place] FDA and  has been authorized for detection and/or diagnosis of SARS-CoV-2 by FDA under an Emergency Use Authorization (EUA). This EUA will remain  in effect (meaning this test can be used) for the duration of the COVID-19 declaration under Section 564(b)(1) of the Act, 21 U.S.C. section 360bbb-3(b)(1), unless the authorization is terminated or revoked sooner.     Influenza A by PCR NEGATIVE NEGATIVE Final   Influenza B by PCR NEGATIVE NEGATIVE Final    Comment: (NOTE) The  Xpert Xpress SARS-CoV-2/FLU/RSV assay is intended as an aid in  the diagnosis of influenza from Nasopharyngeal swab specimens and  should not be used as a sole basis for treatment. Nasal washings and  aspirates are unacceptable for Xpert Xpress SARS-CoV-2/FLU/RSV  testing.  Fact Sheet for Patients: PinkCheek.be  Fact Sheet for Healthcare Providers: GravelBags.it  This test is not yet approved or cleared by the [place] FDA and  has been authorized for detection and/or diagnosis of SARS-CoV-2 by  FDA under an Emergency Use Authorization (EUA). This EUA will remain  in effect (meaning this test can be used) for the duration of the  Covid-19 declaration under Section 564(b)(1) of the Act, 21  U.S.C. section 360bbb-3(b)(1), unless the authorization is  terminated or revoked. Performed at [facility], [address], [place], [place] [address]      Radiology Studies: No results found.  Scheduled Meds: . amiodarone  200 mg Oral Daily  . apixaban  5 mg Oral BID  . DULoxetine  30 mg Oral Daily  . ezetimibe  10 mg Oral Daily  . insulin aspart  0-15 Units Subcutaneous TID WC  . insulin aspart  5 Units Subcutaneous TID WC  . insulin detemir  4 Units Subcutaneous Daily  . metoprolol succinate  12.5 mg Oral Daily  . rosuvastatin  10 mg Oral Daily  . sodium chloride flush  3 mL Intravenous Q12H  . spironolactone  25 mg Oral Daily   Continuous Infusions: . sodium chloride    . albumin human    . furosemide (LASIX) infusion 8 mg/hr (10/24/19 1101)     LOS: 2 days   Time spent: 30 minutes.  Lorella Nimrod, MD [facility]  If 7PM-7AM, please contact night-coverage [email or web address]  10/24/2019, 4:31 PM   This record has been created using Systems analyst. Errors have been sought and corrected,but may not always be located. Such creation errors do not reflect on the standard of care.

## 2019-10-24 NOTE — Progress Notes (Signed)
Inpatient Diabetes Program Recommendations  AACE/ADA: New Consensus Statement on Inpatient Glycemic Control (2015)  Target Ranges:  Prepandial:   less than 140 mg/dL      Peak postprandial:   less than 180 mg/dL (1-2 hours)      Critically ill patients:  140 - 180 mg/dL   Results for JAAMAL, FAROOQUI (MRN [id number]) as of 10/24/2019 09:07  Ref. Range 10/23/2019 08:10 10/23/2019 11:41 10/23/2019 16:53 10/23/2019 21:37  Glucose-Capillary Latest Ref Range: 70 - 99 mg/dL 91 242 (H)  10 units NOVOLOG +  4 units LEVEMIR  152 (H)  8 units NOVOLOG @ 6:11pm 92   Results for SAINTCLAIR, SCHROADER (MRN [id number]) as of 10/24/2019 09:07  Ref. Range 10/24/2019 07:53  Glucose-Capillary Latest Ref Range: 70 - 99 mg/dL 63 (L)     Home DM Meds: Levemir 4-5 units QHS (if glucose 190 mg/dl or higher)       Humalog 10 units TID with meals plus additional units if needed based on glucose    Current Orders: Levemir 4 units Daily      Novolog Moderate Correction Scale/ SSI (0-15 units) TID AC      Novolog 5 units TID with meals     MD- Note patient with Hypoglycemia this AM.  Please consider the following:  1. Reduce Levemir slightly to 3 units Daily  2. Reduce Novolog SSi to the Sensitive scale (0-9 units) TID AC     --Will follow patient during hospitalization--  Wyn Quaker RN, MSN, CDE Diabetes Coordinator Inpatient Glycemic Control Team Team Pager: [phone] (8a-5p)

## 2019-10-24 NOTE — Plan of Care (Addendum)
Pt Axox4. Calm and cooperative and able to voice his needs. Generalized swelling noted to BLE. BIL LE redness/cellulitis noted. Bursted blisters noted to Bil swollen feet and dressings applied. Dressing to Right FA applied over skin tear. Tylenol administered for Right forearm pain 5/10. Effective. Pt on Lasix drip at 5mg /hr. Pt received IV albumin this shift. Pt incontinent of urine. External catheter in place with large amount of clear amber urine. Pt takes pills whole. Tolerates his diet. Accucheck ACHS. Safety measures in place. Will continue to monitor.    Problem: Education: Goal: Knowledge of General Education information will improve Description: Including pain rating scale, medication(s)/side effects and non-pharmacologic comfort measures Outcome: Progressing   Problem: Health Behavior/Discharge Planning: Goal: Ability to manage health-related needs will improve Outcome: Progressing   Problem: Clinical Measurements: Goal: Ability to maintain clinical measurements within normal limits will improve Outcome: Progressing Goal: Will remain free from infection Outcome: Progressing Goal: Diagnostic test results will improve Outcome: Progressing Goal: Respiratory complications will improve Outcome: Progressing Goal: Cardiovascular complication will be avoided Outcome: Progressing   Problem: Activity: Goal: Risk for activity intolerance will decrease Outcome: Progressing   Problem: Nutrition: Goal: Adequate nutrition will be maintained Outcome: Progressing   Problem: Coping: Goal: Level of anxiety will decrease Outcome: Progressing   Problem: Elimination: Goal: Will not experience complications related to bowel motility Outcome: Progressing Goal: Will not experience complications related to urinary retention Outcome: Progressing   Problem: Pain Managment: Goal: General experience of comfort will improve Outcome: Progressing   Problem: Safety: Goal: Ability to remain  free from injury will improve Outcome: Progressing   Problem: Skin Integrity: Goal: Risk for impaired skin integrity will decrease Outcome: Progressing

## 2019-10-24 NOTE — Progress Notes (Signed)
[facility]  ROUNDING NOTE   Subjective:  Craig Koch 64 years old gentleman with a history of CKD stage IIIb, follows up with Dr. Holley Raring as outpatient, was sent to the ED for bilateral lower extremity edema, volume overload, concern for cardiorenal syndrome.   He also has past medical history of diabetes, hypertension, high lipids, and CHF.  Patient is on Furosemide infusion, bilateral lower extremity edema progressively improving.  Objective:  Vital signs in last 24 hours:  Temp:  [97.5 F (36.4 C)-98 F (36.7 C)] 97.8 F (36.6 C) (10/21 1154) Pulse Rate:  [94-105] 95 (10/21 1154) Resp:  [18-20] 20 (10/21 1154) BP: (100-128)/(57-75) 110/65 (10/21 1154) SpO2:  [94 %-98 %] 96 % (10/21 1154) Weight:  [98.2 kg] 98.2 kg (10/21 0911)  Weight change:  Filed Weights   10/21/19 1704 10/24/19 0911  Weight: 99.3 kg 98.2 kg    Intake/Output: I/O last 3 completed shifts: In: 378 [P.O.:760; I.V.:139] Out: 3440 [Urine:3440]   Intake/Output this shift:  Total I/O In: 840 [P.O.:840] Out: 575 [Urine:575]  Physical Exam: General:  In no acute distress  Head:  Moist oral mucosal membranes  Eyes: Anicteric  Neck: Supple, trachea at  midline  Lungs:   Diminished, fine crackles at the bases,Normal and symmetrical effort  Heart: S1S2 ,Regular rate and rhythm  Abdomen:  Soft, nontender, lower abdominal swelling +  Extremities:  3+ peripheral edema.  Neurologic: Oriented x3   Skin: Bilateral lower legs with blisters,erythematous,  Rt.elbow with dressing    Basic Metabolic Panel: Recent Labs  Lab 10/21/19 1731 10/21/19 1731 10/22/19 1256 10/23/19 0413 10/24/19 0533  NA 139  --  139 141 142  K 2.8*  --  3.4* 3.1* 3.9  CL 99  --  104 101 102  CO2 28  --  23 31 31   GLUCOSE 149*  --  347* 60* 67*  BUN 39*  --  37* 37* 35*  CREATININE 2.01*  --  1.79* 1.81* 1.76*  CALCIUM 8.0*   < > 7.5* 8.0* 8.5*  MG  --   --  1.9 2.2 2.0   < > = values in this interval not  displayed.    Liver Function Tests: Recent Labs  Lab 10/21/19 2202  AST 58*  ALT 39  ALKPHOS 321*  BILITOT 1.1  PROT 6.3*  ALBUMIN 2.4*   No results for input(s): LIPASE, AMYLASE in the last 168 hours. No results for input(s): AMMONIA in the last 168 hours.  CBC: Recent Labs  Lab 10/21/19 1731 10/22/19 1256 10/23/19 0413 10/24/19 0533  WBC 7.5 5.9 7.2 6.3  HGB 10.0* 8.6* 9.3* 9.4*  HCT 31.2* 26.6* 29.0* 28.6*  MCV 99.4 100.0 98.0 98.6  PLT 303 261 282 277    Cardiac Enzymes: No results for input(s): CKTOTAL, CKMB, CKMBINDEX, TROPONINI in the last 168 hours.  BNP: Invalid input(s): POCBNP  CBG: Recent Labs  Lab 10/23/19 1653 10/23/19 2137 10/24/19 0753 10/24/19 0818 10/24/19 1145  GLUCAP 152* 92 63* 79 275*    Microbiology: Results for orders placed or performed during the hospital encounter of 10/21/19  Respiratory Panel by RT PCR (Flu A&B, Covid) - Nasopharyngeal Swab     Status: None   Collection Time: 10/21/19  8:40 PM   Specimen: Nasopharyngeal Swab  Result Value Ref Range Status   SARS Coronavirus 2 by RT PCR NEGATIVE NEGATIVE Final    Comment: (NOTE) SARS-CoV-2 target nucleic acids are NOT DETECTED.  The SARS-CoV-2 RNA is generally detectable  in upper respiratoy specimens during the acute phase of infection. The lowest concentration of SARS-CoV-2 viral copies this assay can detect is 131 copies/mL. A negative result does not preclude SARS-Cov-2 infection and should not be used as the sole basis for treatment or other patient management decisions. A negative result may occur with  improper specimen collection/handling, submission of specimen other than nasopharyngeal swab, presence of viral mutation(s) within the areas targeted by this assay, and inadequate number of viral copies (<131 copies/mL). A negative result must be combined with clinical observations, patient history, and epidemiological information. The expected result is  Negative.  Fact Sheet for Patients:  PinkCheek.be  Fact Sheet for Healthcare Providers:  GravelBags.it  This test is no t yet approved or cleared by the [place] FDA and  has been authorized for detection and/or diagnosis of SARS-CoV-2 by FDA under an Emergency Use Authorization (EUA). This EUA will remain  in effect (meaning this test can be used) for the duration of the COVID-19 declaration under Section 564(b)(1) of the Act, 21 U.S.C. section 360bbb-3(b)(1), unless the authorization is terminated or revoked sooner.     Influenza A by PCR NEGATIVE NEGATIVE Final   Influenza B by PCR NEGATIVE NEGATIVE Final    Comment: (NOTE) The Xpert Xpress SARS-CoV-2/FLU/RSV assay is intended as an aid in  the diagnosis of influenza from Nasopharyngeal swab specimens and  should not be used as a sole basis for treatment. Nasal washings and  aspirates are unacceptable for Xpert Xpress SARS-CoV-2/FLU/RSV  testing.  Fact Sheet for Patients: PinkCheek.be  Fact Sheet for Healthcare Providers: GravelBags.it  This test is not yet approved or cleared by the [place] FDA and  has been authorized for detection and/or diagnosis of SARS-CoV-2 by  FDA under an Emergency Use Authorization (EUA). This EUA will remain  in effect (meaning this test can be used) for the duration of the  Covid-19 declaration under Section 564(b)(1) of the Act, 21  U.S.C. section 360bbb-3(b)(1), unless the authorization is  terminated or revoked. Performed at [facility], [address] [place], [place] [address]     Coagulation Studies: No results for input(s): LABPROT, INR in the last 72 hours.  Urinalysis: No results for input(s): COLORURINE, LABSPEC, PHURINE, GLUCOSEU, HGBUR, BILIRUBINUR, KETONESUR, PROTEINUR, UROBILINOGEN, NITRITE, LEUKOCYTESUR in the last 72 hours.  Invalid  input(s): APPERANCEUR    Imaging: No results found.   Medications:   . sodium chloride    . albumin human    . furosemide (LASIX) infusion 8 mg/hr (10/24/19 1101)   . amiodarone  200 mg Oral Daily  . apixaban  5 mg Oral BID  . DULoxetine  30 mg Oral Daily  . ezetimibe  10 mg Oral Daily  . insulin aspart  0-15 Units Subcutaneous TID WC  . insulin aspart  5 Units Subcutaneous TID WC  . insulin detemir  4 Units Subcutaneous Daily  . metoprolol succinate  12.5 mg Oral Daily  . rosuvastatin  10 mg Oral Daily  . sodium chloride flush  3 mL Intravenous Q12H  . spironolactone  25 mg Oral Daily   sodium chloride, acetaminophen, ondansetron (ZOFRAN) IV, sodium chloride flush  Assessment/ Plan:  Craig. Craig Koch is a 64 y.o.  male gentleman with a history of CKD stage IIIb, follows up with Dr. Holley Raring as outpatient, was sent to the ED for bilateral lower extremity edema, volume overload, concern for cardiorenal syndrome.   He also has past medical history of diabetes, hypertension,  high lipids, and CHF. 2D echo from March 15, 2019: LVEF 35 to 40%, moderately decreased LV function, grade 2 diastolic dysfunction, moderately elevated pulmonary artery systolic pressure  # Acute on Chronic systolic CHF, volume overload #Pulmonary hypertension # CKD Stage 3b  Creatinine 1.76 BUN 35 -Lower extremity edema improving gradually -Furosemide increased to 8 mg/hr -IV Albumin reduced to 12.5 mg BID -Standing weights daily  # Hypokalemia Potassium 3.9 today  #Diabetes with CKD Hypoglycemic episodes this morning Encourage oral intake Patient is on SSI and Levemir   #Anemia of CKD Lab Results  Component Value Date   HGB 9.4 (L) 10/24/2019    Oral iron supplements as outpatient   LOS: 2 Eldoris Beiser 10/21/20212:28 PM

## 2019-10-24 NOTE — Progress Notes (Signed)
Mobility Specialist - Progress Note   10/24/19 1100  Mobility  Activity Dangled on edge of bed  Range of Motion/Exercises Right leg;Left leg (ankle pumps, heel slides, seated march, slr, hip iso)  Level of Assistance Modified independent, requires aide device or extra time  Assistive Device None  Distance Ambulated (ft) 0 ft  Mobility Response Tolerated well  Mobility performed by Mobility specialist  $Mobility charge 1 Mobility    Pre-mobility: 95 HR, 90% SpO2 Post-mobility: 96 HR, 92% SpO2   Pt was lying in bed upon arrival. Pt c/o feeling slightly fatigue and weak, however pt agreed to continue session. Pt also c/o pain in feet. When asked to rate pain, pt would state "it's usually about 5 or 6. I'd say 5." Pt was able to get EOB with minA. Pt performed seated exercises: ankle pumps, hip isometrics, heel slides, straight leg raises, and seated march independently. Pt's O2 would range from 86-90% during mobility, however pt continuously denied SOB. No heavy breathing noted. Overall, pt tolerated session well. Pt was left EOB with all needs in reach. Nurse entered at the end of session and was notified.    Kathee Delton Mobility Specialist 10/24/19, 11:26 AM

## 2019-10-24 NOTE — Progress Notes (Signed)
  Heart Failure Nurse Navigator Note  HFrEF 35-40%  He presented to the ED with weight gain and lower extremity edema.  Co morbidities:  Diabetes Coronary artery disease Paroxsymal atrial fibrillation COPD Arthritis Hyperlipidemia Hypertension CKD stage III   Medications:  Amiodarone 200 mg daily Lasix infusion at 8 mg an hour Albumin Eliquis 5 mg BID Zetia 1000 mg daily Metoprolol succinate 12.5 mg BID Carestor 10 mg daily Spironolactone 25 mg daily  Labs:  Sodium 142, potassium 3.9, BUN 35 (37 yesterday), Creatinine 1.76 (1.8 yesterday), hemoglobin 9.4, magnesium 2.0, HbgA1c 8.1, albumin 2.4, BNP 2099, alkaline phos 321, AST 58 and ALT 39.   Intake 899 ml Output 3440 ml   Weight not documented since the 18.    Assessment:  General- is awake and alert, no acute distress noted.  HEENT- pupils equal, normocephalic, +JVD to jaw.  Cardiac-heart tones are regular,  Chest- diminished breath sounds with faint expiratory wheezes.  GI- abdomen rounded, distended.  Musculoskeletal- edema legs +3.  Psych- is pleasant and appropriate, makes Eye contact.  Neuro- speech is clear, moves all extremities.    Wife was at the bedside, stressed the importance of daily weights, recording reporting weight gain of 2 # over night or 5# within the week. Calling either Otila Kluver or his physician.  Also discussed the importance of sticking with the fluid restriction.  If continues to be thirsty to suck on ice, lozneges, or chew gum.  Also stressed the importance of complying with the sodium restriction, discussed food to avoid.  Also discussed wearing TED socks but wife says they are too hard to get on.  Will continue to follow.  Pricilla Riffle RN, CHFN  Given heart failure booklet and zone magnet.

## 2019-10-25 ENCOUNTER — Inpatient Hospital Stay: Payer: Medicare Other | Admitting: Oncology

## 2019-10-25 ENCOUNTER — Inpatient Hospital Stay: Payer: Medicare Other

## 2019-10-25 DIAGNOSIS — I130 Hypertensive heart and chronic kidney disease with heart failure and stage 1 through stage 4 chronic kidney disease, or unspecified chronic kidney disease: Secondary | ICD-10-CM | POA: Diagnosis not present

## 2019-10-25 DIAGNOSIS — I5043 Acute on chronic combined systolic (congestive) and diastolic (congestive) heart failure: Secondary | ICD-10-CM | POA: Diagnosis not present

## 2019-10-25 DIAGNOSIS — S0121XA Laceration without foreign body of nose, initial encounter: Secondary | ICD-10-CM | POA: Diagnosis not present

## 2019-10-25 DIAGNOSIS — R601 Generalized edema: Secondary | ICD-10-CM | POA: Diagnosis not present

## 2019-10-25 LAB — GLUCOSE, CAPILLARY
Glucose-Capillary: 99 mg/dL (ref 70–99)
Glucose-Capillary: 171 mg/dL — ABNORMAL HIGH (ref 70–99)
Glucose-Capillary: 132 mg/dL — ABNORMAL HIGH (ref 70–99)
Glucose-Capillary: 94 mg/dL (ref 70–99)

## 2019-10-25 LAB — RENAL FUNCTION PANEL
Albumin: 2.8 g/dL — ABNORMAL LOW (ref 3.5–5.0)
Anion gap: 9 (ref 5–15)
BUN: 34 mg/dL — ABNORMAL HIGH (ref 8–23)
CO2: 28 mmol/L (ref 22–32)
Calcium: 8.5 mg/dL — ABNORMAL LOW (ref 8.9–10.3)
Chloride: 102 mmol/L (ref 98–111)
Creatinine, Ser: 1.56 mg/dL — ABNORMAL HIGH (ref 0.61–1.24)
GFR, Estimated: 49 mL/min — ABNORMAL LOW (ref >60)
Glucose, Bld: 61 mg/dL — ABNORMAL LOW (ref 70–99)
Phosphorus: 3 mg/dL (ref 2.5–4.6)
Potassium: 3.7 mmol/L (ref 3.5–5.1)
Sodium: 139 mmol/L (ref 135–145)

## 2019-10-25 NOTE — Progress Notes (Signed)
[facility]  ROUNDING NOTE   Subjective:  Craig Koch 63 years old gentleman with a history of CKD stage IIIb, follows up with Dr. Holley Raring as outpatient, was sent to the ED for bilateral lower extremity edema, volume overload, concern for cardiorenal syndrome.   He also has past medical history of diabetes, hypertension, high lipids, and CHF.  Patient is on Furosemide infusion 8 mg/hr, volume status gradually improving. Urine output for the preceding 24 hours is 1975 ml.  Objective:  Vital signs in last 24 hours:  Temp:  [97.6 F (36.4 C)-98.4 F (36.9 C)] 97.7 F (36.5 C) (10/22 0954) Pulse Rate:  [85-95] 89 (10/22 0947) Resp:  [16-24] 18 (10/22 0514) BP: (107-118)/(59-75) 112/59 (10/22 0947) SpO2:  [89 %-97 %] 91 % (10/22 0954)  Weight change:  Filed Weights   10/21/19 1704 10/24/19 0911  Weight: 99.3 kg 98.2 kg    Intake/Output: I/O last 3 completed shifts: In: 0947 [P.O.:1480; I.V.:152.1; IV Piggyback:33.9] Out: 3675 [Urine:3675]   Intake/Output this shift:  Total I/O In: 440 [P.O.:440] Out: -   Physical Exam: General:  In no acute distress  Head:  Normocephalic, Atraumatic,Moist oral mucosal membranes  Eyes: Anicteric  Neck:  trachea at  midline  Lungs:   Respiration even,unlabored,Fine crackles at the bases  Heart: S1S2 ,Regular rate and rhythm  Abdomen:  Soft, nontender, lower abdominal swelling +  Extremities:  3+ peripheral edema.  Neurologic: Awake,alert, Oriented   Skin: Bilateral lower legs with blisters,erythematous,  Rt.elbow with dressing    Basic Metabolic Panel: Recent Labs  Lab 10/21/19 1731 10/21/19 1731 10/22/19 1256 10/22/19 1256 10/23/19 0413 10/24/19 0533 10/25/19 0454  NA 139  --  139  --  141 142 139  K 2.8*  --  3.4*  --  3.1* 3.9 3.7  CL 99  --  104  --  101 102 102  CO2 28  --  23  --  31 31 28   GLUCOSE 149*  --  347*  --  60* 67* 61*  BUN 39*  --  37*  --  37* 35* 34*  CREATININE 2.01*  --  1.79*  --  1.81*  1.76* 1.56*  CALCIUM 8.0*   < > 7.5*   < > 8.0* 8.5* 8.5*  MG  --   --  1.9  --  2.2 2.0  --   PHOS  --   --   --   --   --   --  3.0   < > = values in this interval not displayed.    Liver Function Tests: Recent Labs  Lab 10/21/19 2202 10/25/19 0454  AST 58*  --   ALT 39  --   ALKPHOS 321*  --   BILITOT 1.1  --   PROT 6.3*  --   ALBUMIN 2.4* 2.8*   No results for input(s): LIPASE, AMYLASE in the last 168 hours. No results for input(s): AMMONIA in the last 168 hours.  CBC: Recent Labs  Lab 10/21/19 1731 10/22/19 1256 10/23/19 0413 10/24/19 0533  WBC 7.5 5.9 7.2 6.3  HGB 10.0* 8.6* 9.3* 9.4*  HCT 31.2* 26.6* 29.0* 28.6*  MCV 99.4 100.0 98.0 98.6  PLT 303 261 282 277    Cardiac Enzymes: No results for input(s): CKTOTAL, CKMB, CKMBINDEX, TROPONINI in the last 168 hours.  BNP: Invalid input(s): POCBNP  CBG: Recent Labs  Lab 10/24/19 0818 10/24/19 1145 10/24/19 1722 10/24/19 2059 10/25/19 0742  GLUCAP 79 275* 254* 172*  37    Microbiology: Results for orders placed or performed during the hospital encounter of 10/21/19  Respiratory Panel by RT PCR (Flu A&B, Covid) - Nasopharyngeal Swab     Status: None   Collection Time: 10/21/19  8:40 PM   Specimen: Nasopharyngeal Swab  Result Value Ref Range Status   SARS Coronavirus 2 by RT PCR NEGATIVE NEGATIVE Final    Comment: (NOTE) SARS-CoV-2 target nucleic acids are NOT DETECTED.  The SARS-CoV-2 RNA is generally detectable in upper respiratoy specimens during the acute phase of infection. The lowest concentration of SARS-CoV-2 viral copies this assay can detect is 131 copies/mL. A negative result does not preclude SARS-Cov-2 infection and should not be used as the sole basis for treatment or other patient management decisions. A negative result may occur with  improper specimen collection/handling, submission of specimen other than nasopharyngeal swab, presence of viral mutation(s) within the areas targeted by  this assay, and inadequate number of viral copies (<131 copies/mL). A negative result must be combined with clinical observations, patient history, and epidemiological information. The expected result is Negative.  Fact Sheet for Patients:  PinkCheek.be  Fact Sheet for Healthcare Providers:  GravelBags.it  This test is no t yet approved or cleared by the [place] FDA and  has been authorized for detection and/or diagnosis of SARS-CoV-2 by FDA under an Emergency Use Authorization (EUA). This EUA will remain  in effect (meaning this test can be used) for the duration of the COVID-19 declaration under Section 564(b)(1) of the Act, 21 U.S.C. section 360bbb-3(b)(1), unless the authorization is terminated or revoked sooner.     Influenza A by PCR NEGATIVE NEGATIVE Final   Influenza B by PCR NEGATIVE NEGATIVE Final    Comment: (NOTE) The Xpert Xpress SARS-CoV-2/FLU/RSV assay is intended as an aid in  the diagnosis of influenza from Nasopharyngeal swab specimens and  should not be used as a sole basis for treatment. Nasal washings and  aspirates are unacceptable for Xpert Xpress SARS-CoV-2/FLU/RSV  testing.  Fact Sheet for Patients: PinkCheek.be  Fact Sheet for Healthcare Providers: GravelBags.it  This test is not yet approved or cleared by the [place] FDA and  has been authorized for detection and/or diagnosis of SARS-CoV-2 by  FDA under an Emergency Use Authorization (EUA). This EUA will remain  in effect (meaning this test can be used) for the duration of the  Covid-19 declaration under Section 564(b)(1) of the Act, 21  U.S.C. section 360bbb-3(b)(1), unless the authorization is  terminated or revoked. Performed at [facility], [address] [place], [place] [address]     Coagulation Studies: No results for input(s): LABPROT, INR in the  last 72 hours.  Urinalysis: No results for input(s): COLORURINE, LABSPEC, PHURINE, GLUCOSEU, HGBUR, BILIRUBINUR, KETONESUR, PROTEINUR, UROBILINOGEN, NITRITE, LEUKOCYTESUR in the last 72 hours.  Invalid input(s): APPERANCEUR    Imaging: No results found.   Medications:   . sodium chloride    . albumin human 12.5 g (10/25/19 1008)  . furosemide (LASIX) infusion 8 mg/hr (10/25/19 0307)   . amiodarone  200 mg Oral Daily  . apixaban  5 mg Oral BID  . DULoxetine  30 mg Oral Daily  . ezetimibe  10 mg Oral Daily  . insulin aspart  0-15 Units Subcutaneous TID WC  . metoprolol succinate  12.5 mg Oral Daily  . rosuvastatin  10 mg Oral Daily  . sodium chloride flush  3 mL Intravenous Q12H  . spironolactone  25 mg Oral Daily  sodium chloride, acetaminophen, ondansetron (ZOFRAN) IV, sodium chloride flush  Assessment/ Plan:  Craig Koch is a 64 y.o.  male gentleman with a history of CKD stage IIIb, follows up with Dr. Holley Raring as outpatient, was sent to the ED for bilateral lower extremity edema, volume overload, concern for cardiorenal syndrome.   He also has past medical history of diabetes, hypertension, high lipids, and CHF. 2D echo from March 15, 2019: LVEF 35 to 40%, moderately decreased LV function, grade 2 diastolic dysfunction, moderately elevated pulmonary artery systolic pressure  # Acute on Chronic systolic CHF, volume overload #Pulmonary hypertension # CKD Stage 3b  Creatinine 1.56 BUN 34 -Lower extremity edema with slight improvemnt -Continue Furosemide 8 mg/hr -IV Albumin 12.5 mg BID -Standing weights daily -Strict Intake and output monitoring  #Diabetes with CKD -Early morning hypoglycemic episodes - Diabetes coordinator involved -Plan to adjust long acting and short acting Insulin dosages  #Anemia of CKD Lab Results  Component Value Date   HGB 9.4 (L) 10/24/2019    Oral iron supplements as outpatient   LOS: 3 Chavonne Sforza 10/22/202111:37 AM

## 2019-10-25 NOTE — Progress Notes (Signed)
Inpatient Diabetes Program Recommendations  AACE/ADA: New Consensus Statement on Inpatient Glycemic Control (2015)  Target Ranges:  Prepandial:   less than 140 mg/dL      Peak postprandial:   less than 180 mg/dL (1-2 hours)      Critically ill patients:  140 - 180 mg/dL   Results for Craig Koch, Craig Koch (MRN [id number]) as of 10/25/2019 09:48  Ref. Range 10/24/2019 07:53 10/24/2019 08:18 10/24/2019 11:45 10/24/2019 17:22 10/24/2019 20:59  Glucose-Capillary Latest Ref Range: 70 - 99 mg/dL 63 (L) 79 275 (H)  13 units NOVOLOG @ 1:58pm 254 (H)  13 units NOVOLOG @ 6:22pm 172 (H)   Results for Craig Koch, Craig Koch (MRN [id number]) as of 10/25/2019 09:48  Ref. Range 10/25/2019 04:54  Glucose Latest Ref Range: 70 - 99 mg/dL 61 (L)    Home DM Meds: Levemir 4-5 units QHS (if glucose 190 mg/dl or higher)                             Humalog10 units TID with meals plus additional units if needed based on glucose    Current Orders: Levemir 4 units Daily                            Novolog Moderate Correction Scale/ SSI (0-15 units) TID AC                            Novolog 5 units TID with meals    MD- Note Levemir HELD yesterday AM due to AM Hypoglycemia.  Lab glucose was only 61 mg/dl this AM (CBG at 7am was 99)  It looks like the large doses of Novolog pt is receiving could be contributing to the early AM Hypoglycemia  Please consider:   1. Reduce Levemir slightly to 3 units Daily  2. Reduce the Novolog Meal Coverage to 3 units TID with meals    --Will follow patient during hospitalization--  Wyn Quaker RN, MSN, CDE Diabetes Coordinator Inpatient Glycemic Control Team Team Pager: [phone] (8a-5p)

## 2019-10-25 NOTE — Care Management Important Message (Signed)
Important Message  Patient Details  Name: MCCARTNEY CHUBA MRN: [id number] Date of Birth: 06-Jan-1955   Medicare Important Message Given:  Yes     [name] 10/25/2019, 12:12 PM

## 2019-10-25 NOTE — Progress Notes (Signed)
PROGRESS NOTE    Craig Koch  JOI:[id number] DOB: 31-Oct-1955 DOA: 10/21/2019 PCP: Tonia Ghent, MD   Brief Narrative:  Craig Dawn Gerringeris a 64 y.o.malewith medical history significant forCAD s/p CABGx4, HFrEFEF 30-35% 3/21,paroxysmalatrial fib, HTN, DM2,COPDlung cancerin remission,, as well as history of AKI requiring temporary HD, who presents to the emergency room with worsening lower extremity edema and weight gain associated with orthopnea in spite of outpatient adjustment in his diuretic medication, found to have AKI with CKD and was started on Lasix infusion by nephrology. History of fall at home a day before admission resulted in some lacerations in right elbow and nasal bridge.  Subjective: Patient has no new complaint today.  Gradually improving lower extremity edema.  Good urinary output.  Assessment & Plan:   Active Problems:   Diabetes mellitus with complication (HCC)   CAD (coronary artery disease), native coronary artery   AF (paroxysmal atrial fibrillation) (HCC)   Acute on chronic combined systolic (congestive) and diastolic (congestive) heart failure (HCC)   Anasarca   Swelling of both lower extremities   Cardiorenal syndrome   Laceration of nose without complication  Acute on chronic combined systolic and diastolic heart failure.  EF was 35 to 40% in March 2021.  Clinically admitted for anasarca.  He was started on Lasix infusion along with albumin by nephrology.  Good urinary output. 2 pound decrease in weight since admission, no weight today -Continue Lasix infusion. -Continue with albumin IV. -Continue with home Aldactone. -Continue with daily BMP and weight. -Strict intake and output.  AKI with CKD stage IIIa.  Concern of cardiorenal on admission.  Creatinine improving with diuresis. -Continue to monitor -Avoid nephrotoxins  Blisters bilateral feet -Likely related to tension from edema -Wound care and diuresis. -Change dressing  daily.  CAD (coronary artery disease), native coronary artery -No complaints of chest pain and EKG is nonacute.  Not on aspirin --cont home statin and Zetia  AF (paroxysmal atrial fibrillation) (HCC) -Continue Eliquis.  -Continue amiodarone -Continue home metop  Diabetes mellitus with complication ([place]). Another episode of hypoglycemia with blood glucose at 61 today.  Remained asymptomatic. -Discontinue Levemir and mealtime coverage. -Continue with as needed sliding scale.  COPD. -stable  Objective: Vitals:   10/25/19 0947 10/25/19 0954 10/25/19 1155 10/25/19 1447  BP: (!) 112/59  108/64   Pulse: 89  91   Resp:   19   Temp:  97.7 F (36.5 C) 97.7 F (36.5 C)   TempSrc:   Oral   SpO2: (!) 89% 91% 94%   Weight:    100.1 kg  Height:        Intake/Output Summary (Last 24 hours) at 10/25/2019 1734 Last data filed at 10/25/2019 1424 Gross per 24 hour  Intake 1225.95 ml  Output 2200 ml  Net -974.05 ml   Filed Weights   10/21/19 1704 10/24/19 0911 10/25/19 1447  Weight: 99.3 kg 98.2 kg 100.1 kg    Examination:  General.  Well-developed gentleman, in no acute distress. Pulmonary.  Lungs clear bilaterally, normal respiratory effort. CV.  Regular rate and rhythm, no JVD, rub or murmur. Abdomen.  Soft, nontender, nondistended, BS positive. CNS.  Alert and oriented x3.  No focal neurologic deficit. Extremities.  2+ LE edema, no cyanosis, pulses intact and symmetrical. Psychiatry.  Judgment and insight appears normal.    DVT prophylaxis: Eliquis Code Status: Full Family Communication: Discussed with patient Disposition Plan:  Status is: Inpatient  Remains inpatient appropriate because:Inpatient level of care appropriate  due to severity of illness   Dispo: The patient is from: Home              Anticipated d/c is to: Home              Anticipated d/c date is: > 3 days              Patient currently is not medically stable to d/c.  Patient will need  longer IV diuresis to achieve euvolemia.  Consultants:   Nephrology  Procedures:  Antimicrobials:   Data Reviewed: I have personally reviewed following labs and imaging studies  CBC: Recent Labs  Lab 10/21/19 1731 10/22/19 1256 10/23/19 0413 10/24/19 0533  WBC 7.5 5.9 7.2 6.3  HGB 10.0* 8.6* 9.3* 9.4*  HCT 31.2* 26.6* 29.0* 28.6*  MCV 99.4 100.0 98.0 98.6  PLT 303 261 282 734   Basic Metabolic Panel: Recent Labs  Lab 10/21/19 1731 10/22/19 1256 10/23/19 0413 10/24/19 0533 10/25/19 0454  NA 139 139 141 142 139  K 2.8* 3.4* 3.1* 3.9 3.7  CL 99 104 101 102 102  CO2 28 23 31 31 28   GLUCOSE 149* 347* 60* 67* 61*  BUN 39* 37* 37* 35* 34*  CREATININE 2.01* 1.79* 1.81* 1.76* 1.56*  CALCIUM 8.0* 7.5* 8.0* 8.5* 8.5*  MG  --  1.9 2.2 2.0  --   PHOS  --   --   --   --  3.0   GFR: Estimated Creatinine Clearance: 58.6 mL/min (A) (by C-G formula based on SCr of 1.56 mg/dL (H)). Liver Function Tests: Recent Labs  Lab 10/21/19 2202 10/25/19 0454  AST 58*  --   ALT 39  --   ALKPHOS 321*  --   BILITOT 1.1  --   PROT 6.3*  --   ALBUMIN 2.4* 2.8*   No results for input(s): LIPASE, AMYLASE in the last 168 hours. No results for input(s): AMMONIA in the last 168 hours. Coagulation Profile: No results for input(s): INR, PROTIME in the last 168 hours. Cardiac Enzymes: No results for input(s): CKTOTAL, CKMB, CKMBINDEX, TROPONINI in the last 168 hours. BNP (last 3 results) Recent Labs    08/15/19 1000  PROBNP 1,476.0*   HbA1C: No results for input(s): HGBA1C in the last 72 hours. CBG: Recent Labs  Lab 10/24/19 1145 10/24/19 1722 10/24/19 2059 10/25/19 0742 10/25/19 1157  GLUCAP 275* 254* 172* 99 171*   Lipid Profile: No results for input(s): CHOL, HDL, LDLCALC, TRIG, CHOLHDL, LDLDIRECT in the last 72 hours. Thyroid Function Tests: No results for input(s): TSH, T4TOTAL, FREET4, T3FREE, THYROIDAB in the last 72 hours. Anemia Panel: No results for input(s):  VITAMINB12, FOLATE, FERRITIN, TIBC, IRON, RETICCTPCT in the last 72 hours. Sepsis Labs: No results for input(s): PROCALCITON, LATICACIDVEN in the last 168 hours.  Recent Results (from the past 240 hour(s))  Respiratory Panel by RT PCR (Flu A&B, Covid) - Nasopharyngeal Swab     Status: None   Collection Time: 10/21/19  8:40 PM   Specimen: Nasopharyngeal Swab  Result Value Ref Range Status   SARS Coronavirus 2 by RT PCR NEGATIVE NEGATIVE Final    Comment: (NOTE) SARS-CoV-2 target nucleic acids are NOT DETECTED.  The SARS-CoV-2 RNA is generally detectable in upper respiratoy specimens during the acute phase of infection. The lowest concentration of SARS-CoV-2 viral copies this assay can detect is 131 copies/mL. A negative result does not preclude SARS-Cov-2 infection and should not be used as the sole basis for treatment or  other patient management decisions. A negative result may occur with  improper specimen collection/handling, submission of specimen other than nasopharyngeal swab, presence of viral mutation(s) within the areas targeted by this assay, and inadequate number of viral copies (<131 copies/mL). A negative result must be combined with clinical observations, patient history, and epidemiological information. The expected result is Negative.  Fact Sheet for Patients:  PinkCheek.be  Fact Sheet for Healthcare Providers:  GravelBags.it  This test is no t yet approved or cleared by the [place] FDA and  has been authorized for detection and/or diagnosis of SARS-CoV-2 by FDA under an Emergency Use Authorization (EUA). This EUA will remain  in effect (meaning this test can be used) for the duration of the COVID-19 declaration under Section 564(b)(1) of the Act, 21 U.S.C. section 360bbb-3(b)(1), unless the authorization is terminated or revoked sooner.     Influenza A by PCR NEGATIVE NEGATIVE Final   Influenza B  by PCR NEGATIVE NEGATIVE Final    Comment: (NOTE) The Xpert Xpress SARS-CoV-2/FLU/RSV assay is intended as an aid in  the diagnosis of influenza from Nasopharyngeal swab specimens and  should not be used as a sole basis for treatment. Nasal washings and  aspirates are unacceptable for Xpert Xpress SARS-CoV-2/FLU/RSV  testing.  Fact Sheet for Patients: PinkCheek.be  Fact Sheet for Healthcare Providers: GravelBags.it  This test is not yet approved or cleared by the [place] FDA and  has been authorized for detection and/or diagnosis of SARS-CoV-2 by  FDA under an Emergency Use Authorization (EUA). This EUA will remain  in effect (meaning this test can be used) for the duration of the  Covid-19 declaration under Section 564(b)(1) of the Act, 21  U.S.C. section 360bbb-3(b)(1), unless the authorization is  terminated or revoked. Performed at [facility], [address], [place], [place] [address]      Radiology Studies: No results found.  Scheduled Meds: . amiodarone  200 mg Oral Daily  . apixaban  5 mg Oral BID  . DULoxetine  30 mg Oral Daily  . ezetimibe  10 mg Oral Daily  . insulin aspart  0-15 Units Subcutaneous TID WC  . metoprolol succinate  12.5 mg Oral Daily  . rosuvastatin  10 mg Oral Daily  . sodium chloride flush  3 mL Intravenous Q12H  . spironolactone  25 mg Oral Daily   Continuous Infusions: . sodium chloride    . albumin human 12.5 g (10/25/19 1008)  . furosemide (LASIX) infusion 8 mg/hr (10/25/19 0307)     LOS: 3 days   Time spent: 25 minutes.  Lorella Nimrod, MD [facility]  If 7PM-7AM, please contact night-coverage [email or web address]  10/25/2019, 5:34 PM   This record has been created using Systems analyst. Errors have been sought and corrected,but may not always be located. Such creation errors do not reflect on the standard of care.

## 2019-10-25 NOTE — Progress Notes (Signed)
   Heart Failure Nurse Navigator Note  HFrEF 35-40%  He had presented to the ED with weight gain and ansarca.   Co morbidities:  Diabetes Coronary artery disease Paroxsymal atrial fibrillation COPD Arthritis Hyperlipidemia Hypertension CKD stage III   Medications:  Amiodarone 200 mg daily Lasix infusion at 8 mg an hour Albumin infusion Eliquis 5 mg BID Zetia 1000 mg daily Metoprolol succinate 12.5 mg BID Crestor 10 mg daily Spironolactone 25 mg daily    Labs;  Sodium 139, potassium 3.7, BUN 34(35 yesterday), creatinine 1.56 (1.76 yesterday)  Intake 1386 ml Output 1975 ml  Weight not done today, yesterday 98.2 kg  BP-116/64 pulse 89.  Assessment:  General- he is awake and alert, no acute distress noted.  HEENT- pupils equal, non icteric   Cardiac- heart tones are regular.  Chest- diminished on the right  Abdomen-  Musculoskeletal- remain edematous  Psych-is pleasant and appropriate  Neuro-speech is clear.   Wife was not present today.  He states he had a fairly good nights sleep, woke up a couple of times but not due to SOB.   Discussed today eating soups- was given list of soups from [place] with the sodium content, as wife had thought they were okay to eat, most have greater than 1200 mg of sodium, over one half of the sodium restriction.  Also discussed the zippered support stockings.    Pricilla Riffle RN, CHFN

## 2019-10-26 DIAGNOSIS — I130 Hypertensive heart and chronic kidney disease with heart failure and stage 1 through stage 4 chronic kidney disease, or unspecified chronic kidney disease: Secondary | ICD-10-CM | POA: Diagnosis not present

## 2019-10-26 DIAGNOSIS — I5043 Acute on chronic combined systolic (congestive) and diastolic (congestive) heart failure: Secondary | ICD-10-CM | POA: Diagnosis not present

## 2019-10-26 DIAGNOSIS — R601 Generalized edema: Secondary | ICD-10-CM | POA: Diagnosis not present

## 2019-10-26 DIAGNOSIS — S0121XA Laceration without foreign body of nose, initial encounter: Secondary | ICD-10-CM | POA: Diagnosis not present

## 2019-10-26 LAB — RENAL FUNCTION PANEL
Albumin: 2.7 g/dL — ABNORMAL LOW (ref 3.5–5.0)
Anion gap: 10 (ref 5–15)
BUN: 38 mg/dL — ABNORMAL HIGH (ref 8–23)
CO2: 28 mmol/L (ref 22–32)
Calcium: 8.3 mg/dL — ABNORMAL LOW (ref 8.9–10.3)
Chloride: 100 mmol/L (ref 98–111)
Creatinine, Ser: 1.63 mg/dL — ABNORMAL HIGH (ref 0.61–1.24)
GFR, Estimated: 47 mL/min — ABNORMAL LOW (ref >60)
Glucose, Bld: 159 mg/dL — ABNORMAL HIGH (ref 70–99)
Phosphorus: 3.4 mg/dL (ref 2.5–4.6)
Potassium: 3.5 mmol/L (ref 3.5–5.1)
Sodium: 138 mmol/L (ref 135–145)

## 2019-10-26 LAB — GLUCOSE, CAPILLARY
Glucose-Capillary: 158 mg/dL — ABNORMAL HIGH (ref 70–99)
Glucose-Capillary: 256 mg/dL — ABNORMAL HIGH (ref 70–99)
Glucose-Capillary: 366 mg/dL — ABNORMAL HIGH (ref 70–99)
Glucose-Capillary: 183 mg/dL — ABNORMAL HIGH (ref 70–99)

## 2019-10-26 MED ORDER — SALINE SPRAY 0.65 % NA SOLN
1.0000 | NASAL | Status: DC | PRN
  Filled 2019-10-26: qty 44

## 2019-10-26 MED ORDER — INSULIN ASPART 100 UNIT/ML ~~LOC~~ SOLN
2.0000 [IU] | Freq: Three times a day (TID) | SUBCUTANEOUS | Status: DC
  Administered 2019-10-26 – 2019-10-28 (×7): 2 [IU] via SUBCUTANEOUS
  Filled 2019-10-26 (×7): qty 1

## 2019-10-26 NOTE — Progress Notes (Signed)
PROGRESS NOTE    Craig Koch  NTI:[id number] DOB: 19-Jan-1955 DOA: 10/21/2019 PCP: Tonia Ghent, MD   Brief Narrative:  Craig Dawn Gerringeris a 64 y.o.malewith medical history significant forCAD s/p CABGx4, HFrEFEF 30-35% 3/21,paroxysmalatrial fib, HTN, DM2,COPDlung cancerin remission,, as well as history of AKI requiring temporary HD, who presents to the emergency room with worsening lower extremity edema and weight gain associated with orthopnea in spite of outpatient adjustment in his diuretic medication, found to have AKI with CKD and was started on Lasix infusion by nephrology. History of fall at home a day before admission resulted in some lacerations in right elbow and nasal bridge.  Subjective: Patient has no new complaint today.  Diuresing well.  He was having some dry blood around the left naris.  Assessment & Plan:   Active Problems:   Diabetes mellitus with complication (HCC)   CAD (coronary artery disease), native coronary artery   AF (paroxysmal atrial fibrillation) (HCC)   Acute on chronic combined systolic (congestive) and diastolic (congestive) heart failure (HCC)   Anasarca   Swelling of both lower extremities   Cardiorenal syndrome   Laceration of nose without complication  Acute on chronic combined systolic and diastolic heart failure.  EF was 35 to 40% in March 2021.  Clinically admitted for anasarca.  He was started on Lasix infusion along with albumin by nephrology.  Good urinary output. 10 pound decrease in weight since admission, 209.8 pounds today.  Some increase in creatinine. -Continue Lasix infusion-might need a decrease rate-being managed by nephrology. -Continue with albumin IV. -Continue with home Aldactone. -Continue with daily BMP and weight. -Strict intake and output.  AKI with CKD stage IIIa.  Concern of cardiorenal on admission.  Mild increase in creatinine today. -Continue to monitor -Avoid nephrotoxins  Blisters bilateral  feet -Likely related to tension from edema -Wound care and diuresis. -Change dressing daily.  CAD (coronary artery disease), native coronary artery -No complaints of chest pain and EKG is nonacute.  Not on aspirin --cont home statin and Zetia  AF (paroxysmal atrial fibrillation) (HCC) -Continue Eliquis.  -Continue amiodarone -Continue home metop  Diabetes mellitus with complication ([place]). Another episode of hypoglycemia with blood glucose at 61 today.  Remained asymptomatic. -Discontinue Levemir and mealtime coverage. -Continue with as needed sliding scale.  COPD. -stable  Objective: Vitals:   10/26/19 0500 10/26/19 0758 10/26/19 1158 10/26/19 1207  BP:  108/67 107/64   Pulse:  80 78   Resp:   17   Temp:  97.8 F (36.6 C) 97.8 F (36.6 C)   TempSrc:  Oral    SpO2:  92% 100%   Weight: 100.7 kg   95.2 kg  Height:        Intake/Output Summary (Last 24 hours) at 10/26/2019 1422 Last data filed at 10/26/2019 1300 Gross per 24 hour  Intake 1200 ml  Output 500 ml  Net 700 ml   Filed Weights   10/25/19 1447 10/26/19 0500 10/26/19 1207  Weight: 100.1 kg 100.7 kg 95.2 kg    Examination:  General.  Well-developed gentleman, with a small healing laceration on nasal bridge and a small amount of dried blood around the left naris, in no acute distress. Pulmonary.  Few basal crackles, normal respiratory effort. CV.  Regular rate and rhythm, no JVD, rub or murmur. Abdomen.  Soft, nontender, nondistended, BS positive. CNS.  Alert and oriented x3.  No focal neurologic deficit. Extremities. 2+ LE edema, no cyanosis, pulses intact and symmetrical. Psychiatry.  Judgment and insight appears normal.  DVT prophylaxis: Eliquis Code Status: Full Family Communication: Discussed with patient Disposition Plan:  Status is: Inpatient  Remains inpatient appropriate because:Inpatient level of care appropriate due to severity of illness   Dispo: The patient is from:  Home              Anticipated d/c is to: Home              Anticipated d/c date is: > 3 days              Patient currently is not medically stable to d/c.  Patient will need longer IV diuresis to achieve euvolemia.  Consultants:   Nephrology  Procedures:  Antimicrobials:   Data Reviewed: I have personally reviewed following labs and imaging studies  CBC: Recent Labs  Lab 10/21/19 1731 10/22/19 1256 10/23/19 0413 10/24/19 0533  WBC 7.5 5.9 7.2 6.3  HGB 10.0* 8.6* 9.3* 9.4*  HCT 31.2* 26.6* 29.0* 28.6*  MCV 99.4 100.0 98.0 98.6  PLT 303 261 282 956   Basic Metabolic Panel: Recent Labs  Lab 10/22/19 1256 10/23/19 0413 10/24/19 0533 10/25/19 0454 10/26/19 0544  NA 139 141 142 139 138  K 3.4* 3.1* 3.9 3.7 3.5  CL 104 101 102 102 100  CO2 23 31 31 28 28   GLUCOSE 347* 60* 67* 61* 159*  BUN 37* 37* 35* 34* 38*  CREATININE 1.79* 1.81* 1.76* 1.56* 1.63*  CALCIUM 7.5* 8.0* 8.5* 8.5* 8.3*  MG 1.9 2.2 2.0  --   --   PHOS  --   --   --  3.0 3.4   GFR: Estimated Creatinine Clearance: 54.8 mL/min (A) (by C-G formula based on SCr of 1.63 mg/dL (H)). Liver Function Tests: Recent Labs  Lab 10/21/19 2202 10/25/19 0454 10/26/19 0544  AST 58*  --   --   ALT 39  --   --   ALKPHOS 321*  --   --   BILITOT 1.1  --   --   PROT 6.3*  --   --   ALBUMIN 2.4* 2.8* 2.7*   No results for input(s): LIPASE, AMYLASE in the last 168 hours. No results for input(s): AMMONIA in the last 168 hours. Coagulation Profile: No results for input(s): INR, PROTIME in the last 168 hours. Cardiac Enzymes: No results for input(s): CKTOTAL, CKMB, CKMBINDEX, TROPONINI in the last 168 hours. BNP (last 3 results) Recent Labs    08/15/19 1000  PROBNP 1,476.0*   HbA1C: No results for input(s): HGBA1C in the last 72 hours. CBG: Recent Labs  Lab 10/25/19 1157 10/25/19 1708 10/25/19 2141 10/26/19 0752 10/26/19 1159  GLUCAP 171* 132* 94 158* 256*   Lipid Profile: No results for input(s):  CHOL, HDL, LDLCALC, TRIG, CHOLHDL, LDLDIRECT in the last 72 hours. Thyroid Function Tests: No results for input(s): TSH, T4TOTAL, FREET4, T3FREE, THYROIDAB in the last 72 hours. Anemia Panel: No results for input(s): VITAMINB12, FOLATE, FERRITIN, TIBC, IRON, RETICCTPCT in the last 72 hours. Sepsis Labs: No results for input(s): PROCALCITON, LATICACIDVEN in the last 168 hours.  Recent Results (from the past 240 hour(s))  Respiratory Panel by RT PCR (Flu A&B, Covid) - Nasopharyngeal Swab     Status: None   Collection Time: 10/21/19  8:40 PM   Specimen: Nasopharyngeal Swab  Result Value Ref Range Status   SARS Coronavirus 2 by RT PCR NEGATIVE NEGATIVE Final    Comment: (NOTE) SARS-CoV-2 target nucleic acids are NOT DETECTED.  The SARS-CoV-2  RNA is generally detectable in upper respiratoy specimens during the acute phase of infection. The lowest concentration of SARS-CoV-2 viral copies this assay can detect is 131 copies/mL. A negative result does not preclude SARS-Cov-2 infection and should not be used as the sole basis for treatment or other patient management decisions. A negative result may occur with  improper specimen collection/handling, submission of specimen other than nasopharyngeal swab, presence of viral mutation(s) within the areas targeted by this assay, and inadequate number of viral copies (<131 copies/mL). A negative result must be combined with clinical observations, patient history, and epidemiological information. The expected result is Negative.  Fact Sheet for Patients:  PinkCheek.be  Fact Sheet for Healthcare Providers:  GravelBags.it  This test is no t yet approved or cleared by the [place] FDA and  has been authorized for detection and/or diagnosis of SARS-CoV-2 by FDA under an Emergency Use Authorization (EUA). This EUA will remain  in effect (meaning this test can be used) for the duration of  the COVID-19 declaration under Section 564(b)(1) of the Act, 21 U.S.C. section 360bbb-3(b)(1), unless the authorization is terminated or revoked sooner.     Influenza A by PCR NEGATIVE NEGATIVE Final   Influenza B by PCR NEGATIVE NEGATIVE Final    Comment: (NOTE) The Xpert Xpress SARS-CoV-2/FLU/RSV assay is intended as an aid in  the diagnosis of influenza from Nasopharyngeal swab specimens and  should not be used as a sole basis for treatment. Nasal washings and  aspirates are unacceptable for Xpert Xpress SARS-CoV-2/FLU/RSV  testing.  Fact Sheet for Patients: PinkCheek.be  Fact Sheet for Healthcare Providers: GravelBags.it  This test is not yet approved or cleared by the [place] FDA and  has been authorized for detection and/or diagnosis of SARS-CoV-2 by  FDA under an Emergency Use Authorization (EUA). This EUA will remain  in effect (meaning this test can be used) for the duration of the  Covid-19 declaration under Section 564(b)(1) of the Act, 21  U.S.C. section 360bbb-3(b)(1), unless the authorization is  terminated or revoked. Performed at [facility], [address] [place], [place] [address]      Radiology Studies: No results found.  Scheduled Meds: . amiodarone  200 mg Oral Daily  . apixaban  5 mg Oral BID  . DULoxetine  30 mg Oral Daily  . ezetimibe  10 mg Oral Daily  . insulin aspart  0-15 Units Subcutaneous TID WC  . insulin aspart  2 Units Subcutaneous TID WC  . metoprolol succinate  12.5 mg Oral Daily  . rosuvastatin  10 mg Oral Daily  . sodium chloride flush  3 mL Intravenous Q12H  . spironolactone  25 mg Oral Daily   Continuous Infusions: . sodium chloride    . albumin human 12.5 g (10/26/19 0922)  . furosemide (LASIX) infusion 8 mg/hr (10/26/19 0920)     LOS: 4 days   Time spent: 25 minutes.  Lorella Nimrod, MD [facility]  If 7PM-7AM, please contact  night-coverage [email or web address]  10/26/2019, 2:22 PM   This record has been created using Systems analyst. Errors have been sought and corrected,but may not always be located. Such creation errors do not reflect on the standard of care.

## 2019-10-26 NOTE — Progress Notes (Signed)
Craig Koch  MRN: [id number]  DOB/AGE: 64-21-1957 63 y.o.  Primary Care Physician:Duncan, Elveria Rising, MD  Admit date: 10/21/2019  Chief Complaint:  Chief Complaint  Patient presents with  . Leg Swelling    S-Pt presented on  10/21/2019 with  Chief Complaint  Patient presents with  . Leg Swelling  .  Patient states his swelling is much better than before.On further discussion patient mentioned that his peak weight was 219 pounds.  I then asked nurse to please help him with his standing weight.  Patient weight has now come down to around 210 pounds .  Patient main complaint in today visit was "if he could go home?"   Medications . amiodarone  200 mg Oral Daily  . apixaban  5 mg Oral BID  . DULoxetine  30 mg Oral Daily  . ezetimibe  10 mg Oral Daily  . insulin aspart  0-15 Units Subcutaneous TID WC  . insulin aspart  2 Units Subcutaneous TID WC  . metoprolol succinate  12.5 mg Oral Daily  . rosuvastatin  10 mg Oral Daily  . sodium chloride flush  3 mL Intravenous Q12H  . spironolactone  25 mg Oral Daily         ZLD:[id number] from the symptoms mentioned above,there are no other symptoms referable to all systems reviewed.  Physical Exam: Vital signs in last 24 hours: Temp:  [97.7 F (36.5 C)-98.1 F (36.7 C)] 97.8 F (36.6 C) (10/23 0758) Pulse Rate:  [80-91] 80 (10/23 0758) Resp:  [18-19] 18 (10/22 2350) BP: (102-112)/(59-68) 108/67 (10/23 0758) SpO2:  [89 %-98 %] 92 % (10/23 0758) Weight:  [100.1 kg-100.7 kg] 100.7 kg (10/23 0500) Weight change: 1.9 kg Last BM Date: 10/25/19  Intake/Output from previous day: 10/22 0701 - 10/23 0700 In: 920 [P.O.:920] Out: 1300 [Urine:1300] No intake/output data recorded.   Physical Exam: General- pt is awake,alert, oriented to time place and person  Resp- No acute REsp distress,  NO Rhonchi  CVS- S1S2 regular in rate and rhythm  GIT- BS+, soft, Non tender , Non distended  EXT-1+ LE Edema, Cyanosis    Lab  Results:  CBC  Recent Labs    10/24/19 0533  WBC 6.3  HGB 9.4*  HCT 28.6*  PLT 277    BMET  Recent Labs    10/25/19 0454 10/26/19 0544  NA 139 138  K 3.7 3.5  CL 102 100  CO2 28 28  GLUCOSE 61* 159*  BUN 34* 38*  CREATININE 1.56* 1.63*  CALCIUM 8.5* 8.3*      Most recent Creatinine trend  Lab Results  Component Value Date   CREATININE 1.63 (H) 10/26/2019   CREATININE 1.56 (H) 10/25/2019   CREATININE 1.76 (H) 10/24/2019      MICRO   Recent Results (from the past 240 hour(s))  Respiratory Panel by RT PCR (Flu A&B, Covid) - Nasopharyngeal Swab     Status: None   Collection Time: 10/21/19  8:40 PM   Specimen: Nasopharyngeal Swab  Result Value Ref Range Status   SARS Coronavirus 2 by RT PCR NEGATIVE NEGATIVE Final    Comment: (NOTE) SARS-CoV-2 target nucleic acids are NOT DETECTED.  The SARS-CoV-2 RNA is generally detectable in upper respiratoy specimens during the acute phase of infection. The lowest concentration of SARS-CoV-2 viral copies this assay can detect is 131 copies/mL. A negative result does not preclude SARS-Cov-2 infection and should not be used as the sole basis for treatment or other patient management decisions.  A negative result may occur with  improper specimen collection/handling, submission of specimen other than nasopharyngeal swab, presence of viral mutation(s) within the areas targeted by this assay, and inadequate number of viral copies (<131 copies/mL). A negative result must be combined with clinical observations, patient history, and epidemiological information. The expected result is Negative.  Fact Sheet for Patients:  PinkCheek.be  Fact Sheet for Healthcare Providers:  GravelBags.it  This test is no t yet approved or cleared by the [place] FDA and  has been authorized for detection and/or diagnosis of SARS-CoV-2 by FDA under an Emergency Use Authorization  (EUA). This EUA will remain  in effect (meaning this test can be used) for the duration of the COVID-19 declaration under Section 564(b)(1) of the Act, 21 U.S.C. section 360bbb-3(b)(1), unless the authorization is terminated or revoked sooner.     Influenza A by PCR NEGATIVE NEGATIVE Final   Influenza B by PCR NEGATIVE NEGATIVE Final    Comment: (NOTE) The Xpert Xpress SARS-CoV-2/FLU/RSV assay is intended as an aid in  the diagnosis of influenza from Nasopharyngeal swab specimens and  should not be used as a sole basis for treatment. Nasal washings and  aspirates are unacceptable for Xpert Xpress SARS-CoV-2/FLU/RSV  testing.  Fact Sheet for Patients: PinkCheek.be  Fact Sheet for Healthcare Providers: GravelBags.it  This test is not yet approved or cleared by the [place] FDA and  has been authorized for detection and/or diagnosis of SARS-CoV-2 by  FDA under an Emergency Use Authorization (EUA). This EUA will remain  in effect (meaning this test can be used) for the duration of the  Covid-19 declaration under Section 564(b)(1) of the Act, 21  U.S.C. section 360bbb-3(b)(1), unless the authorization is  terminated or revoked. Performed at [facility], [address] [place], [place] [address]          Impression:   [name] is a 64 year old malea history of CKD stage IIIb, with past medical history of diabetes, hypertension, high lipids, and CHF. who was sent to the ED for bilateral lower extremity edema, volume overload, concern for cardiorenal syndrome.       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 Possible contribution from cardiorenal syndrome/hypertension Progression of CKD marked with multiple episodes of AKI Patient did require renal replacement therapy during his March admission in 2021 Patient creatinine is at baseline   2)HTN Blood pressure is  stable    3)Anemia of chronic disease  CBC Latest Ref Rng & Units 10/24/2019 10/23/2019 10/22/2019  WBC 4.0 - 10.5 K/uL 6.3 7.2 5.9  Hemoglobin 13.0 - 17.0 g/dL 9.4(L) 9.3(L) 8.6(L)  Hematocrit 39 - 52 % 28.6(L) 29.0(L) 26.6(L)  Platelets 150 - 400 K/uL 277 282 261       HGb at goal (9--11)   4) Secondary hyperparathyroidism -CKD Mineral-Bone Disorder    Lab Results  Component Value Date   PTH 65 03/10/2019   CALCIUM 8.3 (L) 10/26/2019   PHOS 3.4 10/26/2019    Secondary Hyperparathyroidism absent.  Phosphorus at goal.   5) acute on chronic systolic CHF 2D echo from March 15, 2019: LVEF 35 to 40%, moderately decreased LV function, grade 2 diastolic dysfunction, moderately elevated pulmonary artery systolic pressure.  Patient is currently being diuresed Patient peak weight was 219 pounds. Patient has now come down to 1 209/210 pounds Patient best weight has been 190 pounds We will continue current dialysis for now We will most likely  transition patient to p.o. diuretics in the morning .  6) Electrolytes   BMP Latest Ref Rng & Units 10/26/2019 10/25/2019 10/24/2019  Glucose 70 - 99 mg/dL 159(H) 61(L) 67(L)  BUN 8 - 23 mg/dL 38(H) 34(H) 35(H)  Creatinine 0.61 - 1.24 mg/dL 1.63(H) 1.56(H) 1.76(H)  BUN/Creat Ratio 10 - 24 - - -  Sodium 135 - 145 mmol/L 138 139 142  Potassium 3.5 - 5.1 mmol/L 3.5 3.7 3.9  Chloride 98 - 111 mmol/L 100 102 102  CO2 22 - 32 mmol/L 28 28 31   Calcium 8.9 - 10.3 mg/dL 8.3(L) 8.5(L) 8.5(L)     Sodium Normonatremic   Potassium Normokalemic    7)Acid base   Co2 at goal     Plan:  We will continue current IV diuretics/IV albumin combination Patient is negative by 5 L We will follow-would most likely change patient diuretics to p.o. in the morning      Datra Clary s [facility] 10/26/2019, 9:04 AM

## 2019-10-27 DIAGNOSIS — I130 Hypertensive heart and chronic kidney disease with heart failure and stage 1 through stage 4 chronic kidney disease, or unspecified chronic kidney disease: Secondary | ICD-10-CM | POA: Diagnosis not present

## 2019-10-27 DIAGNOSIS — S0121XA Laceration without foreign body of nose, initial encounter: Secondary | ICD-10-CM | POA: Diagnosis not present

## 2019-10-27 DIAGNOSIS — R601 Generalized edema: Secondary | ICD-10-CM | POA: Diagnosis not present

## 2019-10-27 DIAGNOSIS — I5043 Acute on chronic combined systolic (congestive) and diastolic (congestive) heart failure: Secondary | ICD-10-CM | POA: Diagnosis not present

## 2019-10-27 LAB — RENAL FUNCTION PANEL
Albumin: 2.9 g/dL — ABNORMAL LOW (ref 3.5–5.0)
Anion gap: 9 (ref 5–15)
BUN: 45 mg/dL — ABNORMAL HIGH (ref 8–23)
CO2: 28 mmol/L (ref 22–32)
Calcium: 8.2 mg/dL — ABNORMAL LOW (ref 8.9–10.3)
Chloride: 100 mmol/L (ref 98–111)
Creatinine, Ser: 1.96 mg/dL — ABNORMAL HIGH (ref 0.61–1.24)
GFR, Estimated: 37 mL/min — ABNORMAL LOW (ref >60)
Glucose, Bld: 131 mg/dL — ABNORMAL HIGH (ref 70–99)
Phosphorus: 3.5 mg/dL (ref 2.5–4.6)
Potassium: 3.4 mmol/L — ABNORMAL LOW (ref 3.5–5.1)
Sodium: 137 mmol/L (ref 135–145)

## 2019-10-27 LAB — GLUCOSE, CAPILLARY
Glucose-Capillary: 150 mg/dL — ABNORMAL HIGH (ref 70–99)
Glucose-Capillary: 262 mg/dL — ABNORMAL HIGH (ref 70–99)
Glucose-Capillary: 263 mg/dL — ABNORMAL HIGH (ref 70–99)
Glucose-Capillary: 121 mg/dL — ABNORMAL HIGH (ref 70–99)

## 2019-10-27 LAB — CBC
HCT: 28.7 % — ABNORMAL LOW (ref 39.0–52.0)
Hemoglobin: 9.5 g/dL — ABNORMAL LOW (ref 13.0–17.0)
MCH: 32.2 pg (ref 26.0–34.0)
MCHC: 33.1 g/dL (ref 30.0–36.0)
MCV: 97.3 fL (ref 80.0–100.0)
Platelets: 299 10*3/uL (ref 150–400)
RBC: 2.95 MIL/uL — ABNORMAL LOW (ref 4.22–5.81)
RDW: 21.9 % — ABNORMAL HIGH (ref 11.5–15.5)
WBC: 8 10*3/uL (ref 4.0–10.5)
nRBC: 0 % (ref 0.0–0.2)

## 2019-10-27 MED ORDER — POTASSIUM CHLORIDE CRYS ER 20 MEQ PO TBCR
20.0000 meq | EXTENDED_RELEASE_TABLET | Freq: Once | ORAL | Status: AC
  Administered 2019-10-27: 20 meq via ORAL
  Filled 2019-10-27: qty 1

## 2019-10-27 MED ORDER — TORSEMIDE 20 MG PO TABS
80.0000 mg | ORAL_TABLET | Freq: Every day | ORAL | Status: DC
  Administered 2019-10-27 – 2019-10-28 (×2): 80 mg via ORAL
  Filled 2019-10-27 (×2): qty 4

## 2019-10-27 NOTE — Progress Notes (Signed)
Craig Koch  MRN: [id number]  DOB/AGE: [birth year] 64 y.o.  Primary Care Physician:Duncan, Elveria Rising, MD  Admit date: 10/21/2019  Chief Complaint:  Chief Complaint  Patient presents with  . Leg Swelling    S-Pt presented on  10/21/2019 with  Chief Complaint  Patient presents with  . Leg Swelling  .  Patient offers no new physical complaints. Patient spouse was present in the room today. Patient spouse main query was about his diuretic regimen at the time of discharge. I then took the opportunity and discussed patient's weight/fluid/edema issues. Patient peak weight was 219 pounds before the admission, patient has lost around 6 pounds of fluid, patient edema is better.  Educated patient about changing his IV diuretics to p.o. diuretics today.  Educated patient that he was taking torsemide 40 mg at home I have changed to 80 mg for now.  Educated patient to stay away from salt Educated patient to check his weight on daily basis and in case he gains more than 3 pounds in a day or more than 5 pounds in a week he needs to increase the dose of diuretics.  Educated patient that patient will get prescription instruction for this at the time of discharge as well I answered patient and his spouse queries to the best of my ability   Medications . amiodarone  200 mg Oral Daily  . apixaban  5 mg Oral BID  . DULoxetine  30 mg Oral Daily  . ezetimibe  10 mg Oral Daily  . insulin aspart  0-15 Units Subcutaneous TID WC  . insulin aspart  2 Units Subcutaneous TID WC  . metoprolol succinate  12.5 mg Oral Daily  . rosuvastatin  10 mg Oral Daily  . sodium chloride flush  3 mL Intravenous Q12H  . spironolactone  25 mg Oral Daily         MGN:OIBBC from the symptoms mentioned above,there are no other symptoms referable to all systems reviewed.  Physical Exam: Vital signs in last 24 hours: Temp:  [97.6 F (36.4 C)-98 F (36.7 C)] 97.8 F (36.6 C) (10/24 0828) Pulse Rate:  [75-86] 86  (10/24 0828) Resp:  [16-20] 16 (10/24 0828) BP: (107-127)/(64-85) 127/85 (10/24 0828) SpO2:  [94 %-100 %] 94 % (10/24 0828) Weight:  [95.1 kg-95.2 kg] 95.1 kg (10/24 0500) Weight change: -4.935 kg Last BM Date: 10/25/19  Intake/Output from previous day: 10/23 0701 - 10/24 0700 In: 960 [P.O.:960] Out: 1500 [Urine:1500] No intake/output data recorded.   Physical Exam: General- pt is awake,alert, oriented to time place and person  Resp- No acute REsp distress,  NO Rhonchi  CVS- S1S2 regular in rate and rhythm  GIT- BS+, soft, Non tender , Non distended  EXT-1+ LE Edema, Cyanosis, patient does have dependent edema    Lab Results:  CBC  Recent Labs    10/27/19 0400  WBC 8.0  HGB 9.5*  HCT 28.7*  PLT 299    BMET  Recent Labs    10/26/19 0544 10/27/19 0400  NA 138 137  K 3.5 3.4*  CL 100 100  CO2 28 28  GLUCOSE 159* 131*  BUN 38* 45*  CREATININE 1.63* 1.96*  CALCIUM 8.3* 8.2*      Most recent Creatinine trend  Lab Results  Component Value Date   CREATININE 1.96 (H) 10/27/2019   CREATININE 1.63 (H) 10/26/2019   CREATININE 1.56 (H) 10/25/2019      MICRO   Recent Results (from the past 240 hour(s))  Respiratory Panel by RT PCR (Flu A&B, Covid) - Nasopharyngeal Swab     Status: None   Collection Time: 10/21/19  8:40 PM   Specimen: Nasopharyngeal Swab  Result Value Ref Range Status   SARS Coronavirus 2 by RT PCR NEGATIVE NEGATIVE Final    Comment: (NOTE) SARS-CoV-2 target nucleic acids are NOT DETECTED.  The SARS-CoV-2 RNA is generally detectable in upper respiratoy specimens during the acute phase of infection. The lowest concentration of SARS-CoV-2 viral copies this assay can detect is 131 copies/mL. A negative result does not preclude SARS-Cov-2 infection and should not be used as the sole basis for treatment or other patient management decisions. A negative result may occur with  improper specimen collection/handling, submission of  specimen other than nasopharyngeal swab, presence of viral mutation(s) within the areas targeted by this assay, and inadequate number of viral copies (<131 copies/mL). A negative result must be combined with clinical observations, patient history, and epidemiological information. The expected result is Negative.  Fact Sheet for Patients:  PinkCheek.be  Fact Sheet for Healthcare Providers:  GravelBags.it  This test is no t yet approved or cleared by the [place] FDA and  has been authorized for detection and/or diagnosis of SARS-CoV-2 by FDA under an Emergency Use Authorization (EUA). This EUA will remain  in effect (meaning this test can be used) for the duration of the COVID-19 declaration under Section 564(b)(1) of the Act, 21 U.S.C. section 360bbb-3(b)(1), unless the authorization is terminated or revoked sooner.     Influenza A by PCR NEGATIVE NEGATIVE Final   Influenza B by PCR NEGATIVE NEGATIVE Final    Comment: (NOTE) The Xpert Xpress SARS-CoV-2/FLU/RSV assay is intended as an aid in  the diagnosis of influenza from Nasopharyngeal swab specimens and  should not be used as a sole basis for treatment. Nasal washings and  aspirates are unacceptable for Xpert Xpress SARS-CoV-2/FLU/RSV  testing.  Fact Sheet for Patients: PinkCheek.be  Fact Sheet for Healthcare Providers: GravelBags.it  This test is not yet approved or cleared by the [place] FDA and  has been authorized for detection and/or diagnosis of SARS-CoV-2 by  FDA under an Emergency Use Authorization (EUA). This EUA will remain  in effect (meaning this test can be used) for the duration of the  Covid-19 declaration under Section 564(b)(1) of the Act, 21  U.S.C. section 360bbb-3(b)(1), unless the authorization is  terminated or revoked. Performed at [facility], [address] [place], [place] [address]          Impression:   Craig Koch is a 64 year old malea history of CKD stage IIIb, with past medical history of diabetes, hypertension, high lipids, and CHF. who was sent to the ED for bilateral lower extremity edema, volume overload, concern for cardiorenal syndrome.       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 Possible contribution from cardiorenal syndrome/hypertension Progression of CKD marked with multiple episodes of AKI Patient did require renal replacement therapy during his March admission in 2021 Patient creatinine is at baseline   2)HTN Blood pressure is stable    3)Anemia of chronic disease  CBC Latest Ref Rng & Units 10/27/2019 10/24/2019 10/23/2019  WBC 4.0 - 10.5 K/uL 8.0 6.3 7.2  Hemoglobin 13.0 - 17.0 g/dL 9.5(L) 9.4(L) 9.3(L)  Hematocrit 39 - 52 % 28.7(L) 28.6(L) 29.0(L)  Platelets 150 - 400 K/uL 299 277 282       HGb at goal (9--11)  4) Secondary hyperparathyroidism -CKD Mineral-Bone Disorder    Lab Results  Component Value Date   PTH 65 03/10/2019   CALCIUM 8.2 (L) 10/27/2019   PHOS 3.5 10/27/2019    Secondary Hyperparathyroidism absent.  Phosphorus at goal.   5) acute on chronic systolic CHF 2D echo from March 15, 2019: LVEF 35 to 40%, moderately decreased LV function, grade 2 diastolic dysfunction, moderately elevated pulmonary artery systolic pressure.  Patient is currently being diuresed Patient peak weight was 219 pounds. Patient has now come down to 1 209/210 pounds Patient best weight has been 190 pounds  We will  transition patient to p.o. diuretics.  6) Electrolytes   BMP Latest Ref Rng & Units 10/27/2019 10/26/2019 10/25/2019  Glucose 70 - 99 mg/dL 131(H) 159(H) 61(L)  BUN 8 - 23 mg/dL 45(H) 38(H) 34(H)  Creatinine 0.61 - 1.24 mg/dL 1.96(H) 1.63(H) 1.56(H)  BUN/Creat Ratio 10 - 24 - - -  Sodium 135 - 145 mmol/L 137 138 139  Potassium 3.5 - 5.1 mmol/L  3.4(L) 3.5 3.7  Chloride 98 - 111 mmol/L 100 100 102  CO2 22 - 32 mmol/L 28 28 28   Calcium 8.9 - 10.3 mg/dL 8.2(L) 8.3(L) 8.5(L)     Sodium Normonatremic   Potassium Hypokalemia Secondary to diuretics We will replete with KCl, patient is also on spironolactone   7)Acid base   Co2 at goal     Plan:  We will change patient's diuretics to p.o Patient was taking torsemide 40 mg at home, I have changed to 80 mg  I answered patient and his spouse queries to the best of my ability We will replete potassium We will follow Chem-7    Shonique Pelphrey s [facility] 10/27/2019, 10:08 AM

## 2019-10-27 NOTE — Plan of Care (Signed)
Continuing with plan of care. 

## 2019-10-27 NOTE — TOC Progression Note (Signed)
Transition of Care [facility]) - Progression Note    Patient Details  Name: Craig Koch MRN: [id number] Date of Birth: 06/17/1955  [facility] of [facility]) CM/SW Contact  Zigmund Daniel Dorian Pod, RN Phone Number: 10/27/2019, 9:26 AM  Clinical Narrative:    [facility] RN spoke with pt today and completed a screen and educated on heart failure. Educated on the HF zones as verified pt is in the GREEN zone. Pt with understanding of when to contact his provider. States he has a regimen to follow from his provider with fluid retention however aware to follow up in 2 days with his provider if not resolved. Will request information packet on HF be added to his discharge packet for ongoing knowledge base. Pt lives with his wife in a house with a good support system, access to transportation to all medical appointments and able to afford his medications. RN continue to inquired on recommendations or ongoing physical therapy however pt again declined the options for HHPT.  TOC team will continue to follow for ongoing discharge needs.   Expected Discharge Plan: Home/Self Care Barriers to Discharge: Continued Medical Work up  Expected Discharge Plan and Services Expected Discharge Plan: Home/Self Care   Discharge Planning Services: CM Consult Post Acute Care Choice: NA Living arrangements for the past 2 months: Single Family Home                                       Social Determinants of Health (SDOH) Interventions    Readmission Risk Interventions Readmission Risk Prevention Plan 03/28/2019 03/06/2019  Transportation Screening Complete Complete  PCP or Specialist Appt within 3-5 Days - Complete  Medication Review Press photographer) Complete Complete  PCP or Specialist appointment within 3-5 days of discharge Complete -  [place] or Home Care Consult Complete -  SW Recovery Care/Counseling Consult Complete -  Palliative Care Screening Complete -  [place] Patient Refused -  Some  recent data might be hidden

## 2019-10-27 NOTE — Progress Notes (Signed)
PROGRESS NOTE    Craig Koch  KCM:[id number] DOB: 09/23/1955 DOA: 10/21/2019 PCP: Tonia Ghent, MD   Brief Narrative:  Craig Dawn Gerringeris a 64 y.o.malewith medical history significant forCAD s/p Koch, Craig Koch,Craig Koch, Craig Koch, Craig Koch,Craig Koch,, Craig Koch, Craig Koch orthopnea in spite of outpatient adjustment in his diuretic medication, found to have AKI Koch CKD and was started on Lasix infusion by nephrology. History of fall at home a day before admission resulted in some lacerations in right elbow and nasal bridge.  Subjective: Patient has no new complaint today.  He was wondering why his legs are still swollen.  Explained to him and his wife that there is a fine balance between diuresis and avoiding worsening of his renal function.  We discussed keeping his legs elevated and using a compression stockings will also help along Koch increased dose of p.o. Lasix Craig advised by nephrology.  Assessment & Plan:   Active Problems:   Diabetes mellitus Koch complication (HCC)   CAD (coronary artery disease), native coronary artery   AF (paroxysmal atrial fibrillation) (HCC)   Acute on chronic combined systolic (congestive) and diastolic (congestive) heart failure (HCC)   Anasarca   Swelling of both lower extremities   Cardiorenal syndrome   Laceration of nose without complication  Acute on chronic combined systolic and diastolic heart failure.  EF was 35 to 40% in March 2021.  Clinically admitted for anasarca.  He was started on Lasix infusion along Koch albumin by nephrology.  Good urinary output. 10 pound decrease in weight since admission, 209.8 pounds today.  Some increase in creatinine. -IV Lasix infusion was discontinued due to worsening creatinine today and he will be transitioned to torsemide 80 mg  daily p.o. from tomorrow. -Discontinue Koch albumin IV. -Continue Koch home Aldactone. -Continue Koch daily BMP and weight. -Strict intake and output.  AKI Koch CKD stage IIIa.  Concern of cardiorenal on admission.  Worsening creatinine now Koch IV Lasix infusion. -Continue to monitor -Avoid nephrotoxins  Blisters bilateral feet -Likely related to tension from edema -Wound care and diuresis. -Change dressing daily.  CAD (coronary artery disease), native coronary artery -No complaints of chest pain and EKG is nonacute.  Not on aspirin --cont home statin and Zetia  AF (paroxysmal atrial fibrillation) (HCC) -Continue Eliquis.  -Continue amiodarone -Continue home metop  Diabetes mellitus Koch complication ([name]). Blood glucose level improved Koch discontinuation of Levemir and he is tolerating 2 units Koch meals well. -Continue 2 units Koch mealtime coverage. -Continue Koch Craig needed sliding scale.  COPD. -stable  Objective: Vitals:   10/27/19 0439 10/27/19 0500 10/27/19 0828 10/27/19 1140  BP: 125/66  127/85 111/68  Pulse: 77  86 78  Resp: 20  16 16   Temp: 97.9 F (36.6 C)  97.8 F (36.6 C) 98.3 F (36.8 C)  TempSrc: Oral  Oral Oral  SpO2: 97%  94% 97%  Weight:  95.1 kg    Height:        Intake/Output Summary (Last 24 hours) at 10/27/2019 1419 Last data filed at 10/27/2019 1300 Gross per 24 hour  Intake 1403.97 ml  Output 1500 ml  Net -96.03 ml   Filed Weights   10/26/19 0500 10/26/19 1207 10/27/19 0500  Weight: 100.7 kg 95.2 kg 95.1 kg    Examination:  General.  Well-developed gentleman, in no acute distress. Pulmonary.  Few basal crackles at left base, normal respiratory effort. CV.  Regular rate and rhythm, no JVD, rub or murmur. Abdomen.  Soft, nontender, nondistended, BS positive. CNS.  Alert and oriented x3.  No focal neurologic deficit. Extremities.  2+ LE edema, no cyanosis, pulses intact and symmetrical. Psychiatry.  Judgment and  insight appears normal.  DVT prophylaxis: Eliquis Code Status: Full Family Communication: Wife was updated at bedside. Disposition Plan:  Status is: Inpatient  Remains inpatient appropriate because:Inpatient level of care appropriate due to severity of illness   Dispo: The patient is from: Home              Anticipated d/c is to: Home              Anticipated d/c date is: 1 day.              Patient currently is not medically stable to d/c.  IV Lasix was discontinued today, we will observe another day.  Possible discharge tomorrow.  Consultants:   Nephrology  Procedures:  Antimicrobials:   Data Reviewed: I have personally reviewed following labs and imaging studies  CBC: Recent Labs  Lab 10/21/19 1731 10/22/19 1256 10/23/19 0413 10/24/19 0533 10/27/19 0400  WBC 7.5 5.9 7.2 6.3 8.0  HGB 10.0* 8.6* 9.3* 9.4* 9.5*  HCT 31.2* 26.6* 29.0* 28.6* 28.7*  MCV 99.4 100.0 98.0 98.6 97.3  PLT 303 261 282 277 824   Basic Metabolic Panel: Recent Labs  Lab 10/22/19 1256 10/22/19 1256 10/23/19 0413 10/24/19 0533 10/25/19 0454 10/26/19 0544 10/27/19 0400  NA 139   < > 141 142 139 138 137  K 3.4*   < > 3.1* 3.9 3.7 3.5 3.4*  CL 104   < > 101 102 102 100 100  CO2 23   < > 31 31 28 28 28   GLUCOSE 347*   < > 60* 67* 61* 159* 131*  BUN 37*   < > 37* 35* 34* 38* 45*  CREATININE 1.79*   < > 1.81* 1.76* 1.56* 1.63* 1.96*  CALCIUM 7.5*   < > 8.0* 8.5* 8.5* 8.3* 8.2*  MG 1.9  --  2.2 2.0  --   --   --   PHOS  --   --   --   --  3.0 3.4 3.5   < > = values in this interval not displayed.   GFR: Estimated Creatinine Clearance: 45.6 mL/min (A) (by C-G formula based on SCr of 1.96 mg/dL (H)). Liver Function Tests: Recent Labs  Lab 10/21/19 2202 10/25/19 0454 10/26/19 0544 10/27/19 0400  AST 58*  --   --   --   ALT 39  --   --   --   ALKPHOS 321*  --   --   --   BILITOT 1.1  --   --   --   PROT 6.3*  --   --   --   ALBUMIN 2.4* 2.8* 2.7* 2.9*   No results for input(s):  LIPASE, AMYLASE in the last 168 hours. No results for input(s): AMMONIA in the last 168 hours. Coagulation Profile: No results for input(s): INR, PROTIME in the last 168 hours. Cardiac Enzymes: No results for input(s): CKTOTAL, CKMB, CKMBINDEX, TROPONINI in the last 168 hours. BNP (last 3 results) Recent Labs    08/15/19 1000  PROBNP 1,476.0*   HbA1C: No results for input(s): HGBA1C in the last 72 hours. CBG: Recent Labs  Lab 10/26/19 1159 10/26/19 1636 10/26/19 2050 10/27/19  0732 10/27/19 1141  GLUCAP 256* 366* 183* 150* 262*   Lipid Profile: No results for input(s): CHOL, HDL, LDLCALC, TRIG, CHOLHDL, LDLDIRECT in the last 72 hours. Thyroid Function Tests: No results for input(s): TSH, T4TOTAL, FREET4, T3FREE, THYROIDAB in the last 72 hours. Anemia Panel: No results for input(s): VITAMINB12, FOLATE, FERRITIN, TIBC, IRON, RETICCTPCT in the last 72 hours. Sepsis Labs: No results for input(s): PROCALCITON, LATICACIDVEN in the last 168 hours.  Recent Results (from the past 240 hour(s))  Respiratory Panel by RT PCR (Flu A&B, Covid) - Nasopharyngeal Swab     Status: None   Collection Time: 10/21/19  8:40 PM   Specimen: Nasopharyngeal Swab  Result Value Ref Range Status   SARS Coronavirus 2 by RT PCR NEGATIVE NEGATIVE Final    Comment: (NOTE) SARS-CoV-2 target nucleic acids are NOT DETECTED.  The SARS-CoV-2 RNA is generally detectable in upper respiratoy specimens during the acute phase of infection. The lowest concentration of SARS-CoV-2 viral copies this assay can detect is 131 copies/mL. A negative result does not preclude SARS-Cov-2 infection and should not be used Craig the sole basis for treatment or other patient management decisions. A negative result may occur Koch  improper specimen collection/handling, submission of specimen other than nasopharyngeal swab, presence of viral mutation(s) within the areas targeted by this assay, and inadequate number of viral  copies (<131 copies/mL). A negative result must be combined Koch clinical observations, patient history, and epidemiological information. The expected result is Negative.  Fact Sheet for Patients:  PinkCheek.be  Fact Sheet for Healthcare Providers:  GravelBags.it  This test is no t yet approved or cleared by the [place] FDA and  has been authorized for detection and/or diagnosis of SARS-CoV-2 by FDA under an Emergency Use Authorization (EUA). This EUA will remain  in effect (meaning this test can be used) for the duration of the COVID-19 declaration under Section 564(b)(1) of the Act, 21 U.S.C. section 360bbb-3(b)(1), unless the authorization is terminated or revoked sooner.     Influenza A by PCR NEGATIVE NEGATIVE Final   Influenza B by PCR NEGATIVE NEGATIVE Final    Comment: (NOTE) The Xpert Xpress SARS-CoV-2/FLU/RSV assay is intended Craig an aid in  the diagnosis of influenza from Nasopharyngeal swab specimens and  should not be used Craig a sole basis for treatment. Nasal washings and  aspirates are unacceptable for Xpert Xpress SARS-CoV-2/FLU/RSV  testing.  Fact Sheet for Patients: PinkCheek.be  Fact Sheet for Healthcare Providers: GravelBags.it  This test is not yet approved or cleared by the [place] FDA and  has been authorized for detection and/or diagnosis of SARS-CoV-2 by  FDA under an Emergency Use Authorization (EUA). This EUA will remain  in effect (meaning this test can be used) for the duration of the  Covid-19 declaration under Section 564(b)(1) of the Act, 21  U.S.C. section 360bbb-3(b)(1), unless the authorization is  terminated or revoked. Performed at [facility], [address] [place], [place] [address]      Radiology Studies: No results found.  Scheduled Meds: . amiodarone  200 mg Oral Daily  . apixaban  5 mg  Oral BID  . DULoxetine  30 mg Oral Daily  . ezetimibe  10 mg Oral Daily  . insulin aspart  0-15 Units Subcutaneous TID WC  . insulin aspart  2 Units Subcutaneous TID WC  . metoprolol succinate  12.5 mg Oral Daily  . rosuvastatin  10 mg Oral Daily  . sodium chloride flush  3  mL Intravenous Q12H  . spironolactone  25 mg Oral Daily  . torsemide  80 mg Oral Daily   Continuous Infusions: . sodium chloride       LOS: 5 days   Time spent: 25 minutes.  Lorella Nimrod, MD [facility]  If 7PM-7AM, please contact night-coverage [email or web address]  10/27/2019, 2:19 PM   This record has been created using Systems analyst. Errors have been sought and corrected,but may not always be located. Such creation errors do not reflect on the standard of care.

## 2019-10-28 ENCOUNTER — Telehealth: Payer: Self-pay

## 2019-10-28 DIAGNOSIS — I5043 Acute on chronic combined systolic (congestive) and diastolic (congestive) heart failure: Secondary | ICD-10-CM | POA: Diagnosis not present

## 2019-10-28 DIAGNOSIS — R601 Generalized edema: Secondary | ICD-10-CM | POA: Diagnosis not present

## 2019-10-28 DIAGNOSIS — I130 Hypertensive heart and chronic kidney disease with heart failure and stage 1 through stage 4 chronic kidney disease, or unspecified chronic kidney disease: Secondary | ICD-10-CM | POA: Diagnosis not present

## 2019-10-28 DIAGNOSIS — S0121XA Laceration without foreign body of nose, initial encounter: Secondary | ICD-10-CM | POA: Diagnosis not present

## 2019-10-28 LAB — RENAL FUNCTION PANEL
Albumin: 2.8 g/dL — ABNORMAL LOW (ref 3.5–5.0)
Anion gap: 12 (ref 5–15)
BUN: 50 mg/dL — ABNORMAL HIGH (ref 8–23)
CO2: 27 mmol/L (ref 22–32)
Calcium: 8.6 mg/dL — ABNORMAL LOW (ref 8.9–10.3)
Chloride: 99 mmol/L (ref 98–111)
Creatinine, Ser: 1.93 mg/dL — ABNORMAL HIGH (ref 0.61–1.24)
GFR, Estimated: 38 mL/min — ABNORMAL LOW (ref >60)
Glucose, Bld: 103 mg/dL — ABNORMAL HIGH (ref 70–99)
Phosphorus: 3.7 mg/dL (ref 2.5–4.6)
Potassium: 3.5 mmol/L (ref 3.5–5.1)
Sodium: 138 mmol/L (ref 135–145)

## 2019-10-28 LAB — GLUCOSE, CAPILLARY
Glucose-Capillary: 115 mg/dL — ABNORMAL HIGH (ref 70–99)
Glucose-Capillary: 215 mg/dL — ABNORMAL HIGH (ref 70–99)

## 2019-10-28 MED ORDER — SPIRONOLACTONE 25 MG PO TABS: 25.0000 mg | ORAL_TABLET | Freq: Every day | ORAL | 1 refills | Status: AC

## 2019-10-28 MED ORDER — IPRATROPIUM-ALBUTEROL 0.5-2.5 (3) MG/3ML IN SOLN
3.0000 mL | Freq: Once | RESPIRATORY_TRACT | Status: AC
  Administered 2019-10-28: 3 mL via RESPIRATORY_TRACT
  Filled 2019-10-28: qty 3

## 2019-10-28 MED ORDER — TORSEMIDE 20 MG PO TABS: 80.0000 mg | ORAL_TABLET | Freq: Every day | ORAL | 1 refills | Status: DC

## 2019-10-28 NOTE — TOC Transition Note (Signed)
Transition of Care [facility]) - CM/SW Discharge Note   Patient Details  Name: Craig Koch MRN: [id number] Date of Birth: 1955/06/18  [facility] CM/SW Contact:  Candie Chroman, LCSW Phone Number: 10/28/2019, 10:31 AM   Clinical Narrative:  Patient has orders to discharge home today. No further concerns. CSW signing off.   Final next level of care: Home/Self Care Barriers to Discharge: Barriers Resolved   Patient Goals and CMS Choice Patient states their goals for this hospitalization and ongoing recovery are:: To return home with wife CMS Medicare.gov Compare Post Acute Care list provided to:: Patient Choice offered to / list presented to : Patient  Discharge Placement                    Patient and family notified of of transfer: 10/28/19  Discharge Plan and Services   Discharge Planning Services: CM Consult Post Acute Care Choice: NA                               Social Determinants of Health (SDOH) Interventions     Readmission Risk Interventions Readmission Risk Prevention Plan 03/28/2019 03/06/2019  Transportation Screening Complete Complete  PCP or Specialist Appt within 3-5 Days - Complete  Medication Review Press photographer) Complete Complete  PCP or Specialist appointment within 3-5 days of discharge Complete -  [place] or Home Care Consult Complete -  SW Recovery Care/Counseling Consult Complete -  Palliative Care Screening Complete -  [place] Patient Refused -  Some recent data might be hidden

## 2019-10-28 NOTE — Telephone Encounter (Signed)
Transition Care Management Follow-up Telephone Call  Date of discharge and from where: 10/28/2019, [facility]  How have you been since you were released from the hospital? Patient states that he is doing much better.  Any questions or concerns? No  Items Reviewed:  Did the pt receive and understand the discharge instructions provided? Yes   Medications obtained and verified? Yes   Other? No   Any new allergies since your discharge? No   Dietary orders reviewed? Yes  Do you have support at home? Yes   Home Care and Equipment/Supplies: Were home health services ordered? not applicable If so, what is the name of the agency? N/A  Has the agency set up a time to come to the patient's home? not applicable Were any new equipment or medical supplies ordered?  No What is the name of the medical supply agency? N/A Were you able to get the supplies/equipment? not applicable Do you have any questions related to the use of the equipment or supplies? No  Functional Questionnaire: (I = Independent and D = Dependent) ADLs: I  Bathing/Dressing- I  Meal Prep- I  Eating- I  Maintaining continence- I  Transferring/Ambulation- I  Managing Meds- I  Follow up appointments reviewed:   PCP Hospital f/u appt confirmed? Yes  Scheduled to see Dr. Damita Dunnings on 11/01/2019 @ 11:30 am.  [facility] f/u appt confirmed? Yes  Scheduled to see cardiology   Are transportation arrangements needed? No   If their condition worsens, is the pt aware to call PCP or go to the Emergency Dept.? Yes  Was the patient provided with contact information for the PCP's office or ED? Yes  Was to pt encouraged to call back with questions or concerns? Yes

## 2019-10-28 NOTE — Discharge Summary (Signed)
Physician Discharge Summary  Craig Koch FGH:[id number] DOB: February 18, 1955 DOA: 10/21/2019  PCP: Tonia Ghent, MD  Admit date: 10/21/2019 Discharge date: 10/28/2019  Admitted From: Home Disposition: Home  Recommendations for Outpatient Follow-up:  1. Follow up with PCP in 1-2 weeks 2. Follow-up with nephrology within a week 3. Please obtain BMP/CBC in one week 4. 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  And a decrease of weight of about 10 pound. Transition to torsemide once creatinine started going up.  Patient will need a close follow-up with nephrology as an outpatient as he has prior history of going on dialysis due to CKD in March 2021.  He will also continue with Aldactone.  Received IV albumin while in the hospital.  Patient did had bilateral feet blisters secondary to tension from edema.  Will need to keep his wound dry and clean dressing daily.  Patient had some lacerations on the bridge of his nose and elbow with a fall at home.  No sign of infection.  He will continue with rest of his home meds and follow-up with his providers.  Discharge Diagnoses:  Active Problems:   Diabetes mellitus with complication (HCC)   CAD (coronary artery disease), native coronary artery   AF  (paroxysmal atrial fibrillation) (HCC)   Acute on chronic combined systolic (congestive) and diastolic (congestive) heart failure (HCC)   Anasarca   Swelling of both lower extremities   Cardiorenal syndrome   Laceration of nose without complication   Discharge Instructions  Discharge Instructions    Diet - low sodium heart healthy   Complete by: As directed    Discharge instructions   Complete by: As directed    It was pleasure taking care of you. Your nephrologist increase the dose of torsemide from 40 mg to 80 mg daily, please take it as directed.  They also added spironolactone 25 mg daily.  You can continue taking 10 mEq of potassium but keep in mind that spironolactone can also increase the potassium in your body especially when your kidneys are not functioning well.  Please have your electrolytes checked in the next few days by your primary care provider or nephrologist. Please keep your legs elevated when sitting and lying down and use compression stockings or Ace wrap when walking around to help reduce the swelling. Please follow-up with nephrology and primary care with them 1 to 2 weeks.   Increase activity slowly   Complete by: As directed    No wound care   Complete by: As directed      Allergies as of 10/28/2019      Reactions   Promethazine Other (See Comments)   QT prolongation.    Trazodone And Nefazodone Other (See Comments)   QT prolongation.    Lipitor [atorvastatin Calcium] Other (See Comments)   Aches.  Tolerated crestor.       Medication List  TAKE these medications   albuterol 108 (90 Base) MCG/ACT inhaler Commonly known as: VENTOLIN HFA Inhale 2 puffs into the lungs every 6 (six) hours as needed for wheezing or shortness of breath.   amiodarone 200 MG tablet Commonly known as: PACERONE Take 1 tablet (200 mg total) by mouth daily.   apixaban 5 MG Tabs tablet Commonly known as: Eliquis Take 1 tablet (5 mg total) by mouth 2 (two) times daily.    DULoxetine 30 MG capsule Commonly known as: CYMBALTA TAKE 1 CAPSULE BY MOUTH EVERY DAY What changed: how much to take   ezetimibe 10 MG tablet Commonly known as: ZETIA Take 1 tablet (10 mg total) by mouth daily.   ferrous sulfate 325 (65 FE) MG tablet TAKE 1 TABLET (325 MG TOTAL) BY MOUTH 2 (TWO) TIMES DAILY WITH A MEAL.   folic acid 644 MCG tablet Commonly known as: FOLVITE Take 400 mcg by mouth daily.   HumaLOG 100 UNIT/ML injection Generic drug: insulin lispro Inject 10-15 Units into the skin 3 (three) times daily. Patient takes 10 unit TID with meals plus additional units if needed based on glucose   insulin detemir 100 UNIT/ML injection Commonly known as: LEVEMIR Inject 0.2 mLs (20 Units total) into the skin at bedtime. What changed:   how much to take  additional instructions   ipratropium-albuterol 0.5-2.5 (3) MG/3ML Soln Commonly known as: DUONEB Take 3 mLs by nebulization 4 (four) times daily.   metoprolol succinate 25 MG 24 hr tablet Commonly known as: TOPROL-XL TAKE 1/2 TABLET BY MOUTH EVERY DAY   multivitamin Tabs tablet Take 1 tablet by mouth at bedtime.   mupirocin ointment 2 % Commonly known as: Bactroban Apply 1 application topically 2 (two) times daily. Affected areas   Pepto-Bismol 262 MG/15ML suspension Generic drug: bismuth subsalicylate Take 30 mLs by mouth every 6 (six) hours as needed for diarrhea or loose stools.   potassium chloride 10 MEQ tablet Commonly known as: KLOR-CON TAKE 1 TABLET BY MOUTH EVERY DAY   promethazine 25 MG tablet Commonly known as: PHENERGAN Take 25 mg by mouth every 6 (six) hours as needed for nausea or vomiting.   rosuvastatin 10 MG tablet Commonly known as: CRESTOR TAKE 1 TABLET (10 MG TOTAL) BY MOUTH DAILY. What changed: See the new instructions.   spironolactone 25 MG tablet Commonly known as: ALDACTONE Take 1 tablet (25 mg total) by mouth daily.   torsemide 20 MG tablet Commonly known as:  DEMADEX Take 4 tablets (80 mg total) by mouth daily. What changed: how much to take       Follow-up Information    Tonia Ghent, MD.   Specialty: [facility] Contact information: [place] [address] [place] Follow up on 10/30/2019.   Specialty: Cardiology Why: at 12:30pm. Enter through the [facility] entrance Contact information: [place], [place], [place]. Schedule an appointment as soon as possible for a visit in 1 week(s).   Specialty: Nephrology Contact information: [place] [address] [phone]              Allergies  Allergen Reactions  . Promethazine Other (See Comments)    QT prolongation.   . Trazodone And Nefazodone Other (See Comments)    QT prolongation.   . Lipitor [Atorvastatin Calcium]  Other (See Comments)    Aches.  Tolerated crestor.     Consultations:  Nephrology  Procedures/Studies: DG Chest 2 View  Result Date: 10/21/2019 CLINICAL DATA:  Bilateral lower extremity swelling and weight gain. EXAM: CHEST - 2 VIEW COMPARISON:  July 29, 2019 FINDINGS: Multiple sternal wires and vascular clips are present. Mild, diffusely increased interstitial lung markings are noted. Mild to moderate severity infiltrates are seen within the mid and lower left lung. This is mildly increased in severity when compared to the prior study. The infiltrates seen within the right lung on the prior study are markedly decreased in severity when compared to the prior exam, with a very small amount of residual right basilar atelectasis and/or infiltrate seen. Small, stable bilateral pleural effusions are present. No pneumothorax is identified. The cardiac silhouette is mildly enlarged. The visualized skeletal structures are unremarkable. IMPRESSION: 1. Mild to moderate  severity left lower lobe infiltrate, mildly increased in severity when compared to the prior study. 2. Very mild residual right basilar atelectasis and/or infiltrate. 3. Small, stable bilateral pleural effusions. Electronically Signed   By: Virgina Norfolk M.D.   On: 10/21/2019 21:11   DG Shoulder Right  Result Date: 10/21/2019 CLINICAL DATA:  Recent fall with shoulder pain, initial encounter EXAM: RIGHT SHOULDER - 2+ VIEW COMPARISON:  None. FINDINGS: Degenerative changes of the acromioclavicular joint are seen. No acute fracture or dislocation is noted. No soft tissue abnormality is seen. Ribcage is within normal limits. IMPRESSION: Degenerative change without acute abnormality. Electronically Signed   By: Inez Catalina M.D.   On: 10/21/2019 22:45   CT Head Wo Contrast  Result Date: 10/21/2019 CLINICAL DATA:  Fall.  Head injury.  History of lung cancer. EXAM: CT HEAD WITHOUT CONTRAST CT CERVICAL SPINE WITHOUT CONTRAST TECHNIQUE: Multidetector CT imaging of the head and cervical spine was performed following the standard protocol without intravenous contrast. Multiplanar CT image reconstructions of the cervical spine were also generated. COMPARISON:  CT head 03/05/2019 FINDINGS: CT HEAD FINDINGS Brain: Generalized atrophy unchanged. Mild white matter hypodensity bilaterally unchanged. Negative for acute infarct, hemorrhage, mass. Vascular: Negative for hyperdense vessel Skull: Negative Sinuses/Orbits: Paranasal sinuses clear.  Left cataract extraction. Other: None CT CERVICAL SPINE FINDINGS Alignment: 3 mm anterolisthesis C3-4 and C4-5 Skull base and vertebrae: Negative for fracture Soft tissues and spinal canal: Negative for mass or adenopathy in the neck. Atherosclerotic calcification carotid bifurcation bilaterally. Disc levels: Multilevel disc and facet degeneration throughout the cervical spine. Multilevel spinal stenosis throughout the cervical spine most prominent C5-6 and C6-7. Upper chest:  Moderately large right pleural effusion and small left pleural effusion. Apical emphysema. Other: None IMPRESSION: 1. No acute intracranial abnormality 2. Negative for cervical spine fracture.  Cervical spondylosis 3. Moderately large right pleural effusion and mild left pleural effusion. Electronically Signed   By: Franchot Gallo M.D.   On: 10/21/2019 17:59   CT Cervical Spine Wo Contrast  Result Date: 10/21/2019 CLINICAL DATA:  Fall.  Head injury.  History of lung cancer. EXAM: CT HEAD WITHOUT CONTRAST CT CERVICAL SPINE WITHOUT CONTRAST TECHNIQUE: Multidetector CT imaging of the head and cervical spine was performed following the standard protocol without intravenous contrast. Multiplanar CT image reconstructions of the cervical spine were also generated. COMPARISON:  CT head 03/05/2019 FINDINGS: CT HEAD FINDINGS Brain: Generalized atrophy unchanged. Mild white matter hypodensity bilaterally unchanged. Negative for acute infarct, hemorrhage, mass. Vascular: Negative for hyperdense vessel Skull: Negative Sinuses/Orbits: Paranasal sinuses clear.  Left cataract extraction. Other: None CT CERVICAL  SPINE FINDINGS Alignment: 3 mm anterolisthesis C3-4 and C4-5 Skull base and vertebrae: Negative for fracture Soft tissues and spinal canal: Negative for mass or adenopathy in the neck. Atherosclerotic calcification carotid bifurcation bilaterally. Disc levels: Multilevel disc and facet degeneration throughout the cervical spine. Multilevel spinal stenosis throughout the cervical spine most prominent C5-6 and C6-7. Upper chest: Moderately large right pleural effusion and small left pleural effusion. Apical emphysema. Other: None IMPRESSION: 1. No acute intracranial abnormality 2. Negative for cervical spine fracture.  Cervical spondylosis 3. Moderately large right pleural effusion and mild left pleural effusion. Electronically Signed   By: Franchot Gallo M.D.   On: 10/21/2019 17:59     Subjective: Patient has no new  complaint today when seen during morning rounds. Discussed keep his leg raised while sitting and lying down and use compression stockings or Ace wrap with ambulation to help with lower extremity edema.  Discharge Exam: Vitals:   10/28/19 0358 10/28/19 0902  BP: (!) 103/57 107/61  Pulse: 73 83  Resp: 16 20  Temp: 97.7 F (36.5 C) 97.8 F (36.6 C)  SpO2: 98% 91%   Vitals:   10/27/19 1936 10/27/19 2329 10/28/19 0358 10/28/19 0902  BP: 108/65 (!) 97/59 (!) 103/57 107/61  Pulse: 78 72 73 83  Resp: 20 18 16 20   Temp: 98.4 F (36.9 C) 98.3 F (36.8 C) 97.7 F (36.5 C) 97.8 F (36.6 C)  TempSrc: Oral Oral Oral Oral  SpO2: 97% 97% 98% 91%  Weight:   (P) 94.2 kg   Height:        General: Pt is alert, awake, not in acute distress Cardiovascular: RRR, S1/S2 +, no rubs, no gallops Respiratory: CTA bilaterally, no wheezing, no rhonchi Abdominal: Soft, NT, ND, bowel sounds + Extremities: 2+ LE edema, no cyanosis   The results of significant diagnostics from this hospitalization (including imaging, microbiology, ancillary and laboratory) are listed below for reference.    Microbiology: Recent Results (from the past 240 hour(s))  Respiratory Panel by RT PCR (Flu A&B, Covid) - Nasopharyngeal Swab     Status: None   Collection Time: 10/21/19  8:40 PM   Specimen: Nasopharyngeal Swab  Result Value Ref Range Status   SARS Coronavirus 2 by RT PCR NEGATIVE NEGATIVE Final    Comment: (NOTE) SARS-CoV-2 target nucleic acids are NOT DETECTED.  The SARS-CoV-2 RNA is generally detectable in upper respiratoy specimens during the acute phase of infection. The lowest concentration of SARS-CoV-2 viral copies this assay can detect is 131 copies/mL. A negative result does not preclude SARS-Cov-2 infection and should not be used as the sole basis for treatment or other patient management decisions. A negative result may occur with  improper specimen collection/handling, submission of specimen  other than nasopharyngeal swab, presence of viral mutation(s) within the areas targeted by this assay, and inadequate number of viral copies (<131 copies/mL). A negative result must be combined with clinical observations, patient history, and epidemiological information. The expected result is Negative.  Fact Sheet for Patients:  PinkCheek.be  Fact Sheet for Healthcare Providers:  GravelBags.it  This test is no t yet approved or cleared by the [place] FDA and  has been authorized for detection and/or diagnosis of SARS-CoV-2 by FDA under an Emergency Use Authorization (EUA). This EUA will remain  in effect (meaning this test can be used) for the duration of the COVID-19 declaration under Section 564(b)(1) of the Act, 21 U.S.C. section 360bbb-3(b)(1), unless the authorization is terminated or revoked sooner.  Influenza A by PCR NEGATIVE NEGATIVE Final   Influenza B by PCR NEGATIVE NEGATIVE Final    Comment: (NOTE) The Xpert Xpress SARS-CoV-2/FLU/RSV assay is intended as an aid in  the diagnosis of influenza from Nasopharyngeal swab specimens and  should not be used as a sole basis for treatment. Nasal washings and  aspirates are unacceptable for Xpert Xpress SARS-CoV-2/FLU/RSV  testing.  Fact Sheet for Patients: PinkCheek.be  Fact Sheet for Healthcare Providers: GravelBags.it  This test is not yet approved or cleared by the [place] FDA and  has been authorized for detection and/or diagnosis of SARS-CoV-2 by  FDA under an Emergency Use Authorization (EUA). This EUA will remain  in effect (meaning this test can be used) for the duration of the  Covid-19 declaration under Section 564(b)(1) of the Act, 21  U.S.C. section 360bbb-3(b)(1), unless the authorization is  terminated or revoked. Performed at [facility], [address]  [place], [place] [address]      Labs: BNP (last 3 results) Recent Labs    09/10/19 1007 10/04/19 1633 10/21/19 2202  BNP 2,022.2* 2,647* 4,132.4*   Basic Metabolic Panel: Recent Labs  Lab 10/22/19 1256 10/22/19 1256 10/23/19 0413 10/23/19 0413 10/24/19 0533 10/25/19 0454 10/26/19 0544 10/27/19 0400 10/28/19 0552  NA 139   < > 141   < > 142 139 138 137 138  K 3.4*   < > 3.1*   < > 3.9 3.7 3.5 3.4* 3.5  CL 104   < > 101   < > 102 102 100 100 99  CO2 23   < > 31   < > 31 28 28 28 27   GLUCOSE 347*   < > 60*   < > 67* 61* 159* 131* 103*  BUN 37*   < > 37*   < > 35* 34* 38* 45* 50*  CREATININE 1.79*   < > 1.81*   < > 1.76* 1.56* 1.63* 1.96* 1.93*  CALCIUM 7.5*   < > 8.0*   < > 8.5* 8.5* 8.3* 8.2* 8.6*  MG 1.9  --  2.2  --  2.0  --   --   --   --   PHOS  --   --   --   --   --  3.0 3.4 3.5 3.7   < > = values in this interval not displayed.   Liver Function Tests: Recent Labs  Lab 10/21/19 2202 10/25/19 0454 10/26/19 0544 10/27/19 0400 10/28/19 0552  AST 58*  --   --   --   --   ALT 39  --   --   --   --   ALKPHOS 321*  --   --   --   --   BILITOT 1.1  --   --   --   --   PROT 6.3*  --   --   --   --   ALBUMIN 2.4* 2.8* 2.7* 2.9* 2.8*   No results for input(s): LIPASE, AMYLASE in the last 168 hours. No results for input(s): AMMONIA in the last 168 hours. CBC: Recent Labs  Lab 10/21/19 1731 10/22/19 1256 10/23/19 0413 10/24/19 0533 10/27/19 0400  WBC 7.5 5.9 7.2 6.3 8.0  HGB 10.0* 8.6* 9.3* 9.4* 9.5*  HCT 31.2* 26.6* 29.0* 28.6* 28.7*  MCV 99.4 100.0 98.0 98.6 97.3  PLT 303 261 282 277 299   Cardiac Enzymes: No results for input(s): CKTOTAL, CKMB, CKMBINDEX, TROPONINI in the last 168  hours. BNP: Invalid input(s): POCBNP CBG: Recent Labs  Lab 10/27/19 0732 10/27/19 1141 10/27/19 1707 10/27/19 2104 10/28/19 0747  GLUCAP 150* 262* 263* 121* 115*   D-Dimer No results for input(s): DDIMER in the last 72 hours. Hgb A1c No results for input(s): HGBA1C  in the last 72 hours. Lipid Profile No results for input(s): CHOL, HDL, LDLCALC, TRIG, CHOLHDL, LDLDIRECT in the last 72 hours. Thyroid function studies No results for input(s): TSH, T4TOTAL, T3FREE, THYROIDAB in the last 72 hours.  Invalid input(s): FREET3 Anemia work up No results for input(s): VITAMINB12, FOLATE, FERRITIN, TIBC, IRON, RETICCTPCT in the last 72 hours. Urinalysis    Component Value Date/Time   COLORURINE YELLOW (A) 03/17/2019 1940   APPEARANCEUR HAZY (A) 03/17/2019 1940   LABSPEC 1.017 03/17/2019 1940   PHURINE 5.0 03/17/2019 1940   GLUCOSEU 50 (A) 03/17/2019 1940   HGBUR NEGATIVE 03/17/2019 [place] NEGATIVE 03/17/2019 1940   KETONESUR 5 (A) 03/17/2019 1940   PROTEINUR 100 (A) 03/17/2019 1940   NITRITE NEGATIVE 03/17/2019 1940   LEUKOCYTESUR NEGATIVE 03/17/2019 1940   Sepsis Labs Invalid input(s): PROCALCITONIN,  WBC,  LACTICIDVEN Microbiology Recent Results (from the past 240 hour(s))  Respiratory Panel by RT PCR (Flu A&B, Covid) - Nasopharyngeal Swab     Status: None   Collection Time: 10/21/19  8:40 PM   Specimen: Nasopharyngeal Swab  Result Value Ref Range Status   SARS Coronavirus 2 by RT PCR NEGATIVE NEGATIVE Final    Comment: (NOTE) SARS-CoV-2 target nucleic acids are NOT DETECTED.  The SARS-CoV-2 RNA is generally detectable in upper respiratoy specimens during the acute phase of infection. The lowest concentration of SARS-CoV-2 viral copies this assay can detect is 131 copies/mL. A negative result does not preclude SARS-Cov-2 infection and should not be used as the sole basis for treatment or other patient management decisions. A negative result may occur with  improper specimen collection/handling, submission of specimen other than nasopharyngeal swab, presence of viral mutation(s) within the areas targeted by this assay, and inadequate number of viral copies (<131 copies/mL). A negative result must be combined with  clinical observations, patient history, and epidemiological information. The expected result is Negative.  Fact Sheet for Patients:  PinkCheek.be  Fact Sheet for Healthcare Providers:  GravelBags.it  This test is no t yet approved or cleared by the [place] FDA and  has been authorized for detection and/or diagnosis of SARS-CoV-2 by FDA under an Emergency Use Authorization (EUA). This EUA will remain  in effect (meaning this test can be used) for the duration of the COVID-19 declaration under Section 564(b)(1) of the Act, 21 U.S.C. section 360bbb-3(b)(1), unless the authorization is terminated or revoked sooner.     Influenza A by PCR NEGATIVE NEGATIVE Final   Influenza B by PCR NEGATIVE NEGATIVE Final    Comment: (NOTE) The Xpert Xpress SARS-CoV-2/FLU/RSV assay is intended as an aid in  the diagnosis of influenza from Nasopharyngeal swab specimens and  should not be used as a sole basis for treatment. Nasal washings and  aspirates are unacceptable for Xpert Xpress SARS-CoV-2/FLU/RSV  testing.  Fact Sheet for Patients: PinkCheek.be  Fact Sheet for Healthcare Providers: GravelBags.it  This test is not yet approved or cleared by the [place] FDA and  has been authorized for detection and/or diagnosis of SARS-CoV-2 by  FDA under an Emergency Use Authorization (EUA). This EUA will remain  in effect (meaning this test can be used) for the duration of the  Covid-19  declaration under Section 564(b)(1) of the Act, 21  U.S.C. section 360bbb-3(b)(1), unless the authorization is  terminated or revoked. Performed at [facility], [address] [place], [place] [address]     Time coordinating discharge: Over 30 minutes  SIGNED:  Lorella Nimrod, MD  [facility] 10/28/2019, 10:16 AM  If 7PM-7AM, please contact  night-coverage [email or web address]  This record has been created using Systems analyst. Errors have been sought and corrected,but may not always be located. Such creation errors do not reflect on the standard of care.

## 2019-10-28 NOTE — Care Management Important Message (Signed)
Important Message  Patient Details  Name: Craig Koch MRN: [id number] Date of Birth: 11-14-1955   Medicare Important Message Given:  Yes     [name] 10/28/2019, 12:03 PM

## 2019-10-28 NOTE — Progress Notes (Signed)
[facility]  ROUNDING NOTE   Subjective:  Craig Koch 64 years old gentleman with a history of CKD stage IIIb, follows up with Dr. Holley Raring as outpatient, was sent to the ED for bilateral lower extremity edema, volume overload, concern for cardiorenal syndrome.  He also has past medical history of diabetes, hypertension, high lipids, and CHF.  Furosemide infusion discontinued over the weekend, currently on Torsemide. Bilateral lower extremity edema improving,denies SOB.    Objective:  Vital signs in last 24 hours:  Temp:  [97.7 F (36.5 C)-98.4 F (36.9 C)] 97.7 F (36.5 C) (10/25 0358) Pulse Rate:  [72-85] 73 (10/25 0358) Resp:  [16-20] 16 (10/25 0358) BP: (97-120)/(57-75) 103/57 (10/25 0358) SpO2:  [95 %-98 %] 98 % (10/25 0358) Weight:  [94.2 kg-95.8 kg] (P) 94.2 kg (10/25 0358)  Weight change: 0.635 kg Filed Weights   10/27/19 0500 10/27/19 1707 10/28/19 0358  Weight: 95.1 kg 95.8 kg (P) 94.2 kg    Intake/Output: I/O last 3 completed shifts: In: 1205.6 [P.O.:480; I.V.:468.2; IV Piggyback:257.4] Out: 2500 [Urine:2500]   Intake/Output this shift:  No intake/output data recorded.  Physical Exam: General:  In no acute distress  Head: Moist oral mucosal membranes  Eyes: Anicteric  Lungs:   Normal and symmetrical respiratory effort,able to talk in full sentences,fine crackles at the bases  Heart: Regular rate and rhythm,HR in 90's  Abdomen:  Soft, nontender, lower abdominal swelling +  Extremities:  2+ peripheral edema.  Neurologic: Oriented x3   Skin: No new lesions or rashes, blisters on lower legs healing    Basic Metabolic Panel: Recent Labs  Lab 10/22/19 1256 10/22/19 1256 10/23/19 0413 10/23/19 0413 10/24/19 0533 10/24/19 0533 10/25/19 0454 10/25/19 0454 10/26/19 0544 10/27/19 0400 10/28/19 0552  NA 139   < > 141   < > 142  --  139  --  138 137 138  K 3.4*   < > 3.1*   < > 3.9  --  3.7  --  3.5 3.4* 3.5  CL 104   < > 101   < > 102  --   102  --  100 100 99  CO2 23   < > 31   < > 31  --  28  --  28 28 27   GLUCOSE 347*   < > 60*   < > 67*  --  61*  --  159* 131* 103*  BUN 37*   < > 37*   < > 35*  --  34*  --  38* 45* 50*  CREATININE 1.79*   < > 1.81*   < > 1.76*  --  1.56*  --  1.63* 1.96* 1.93*  CALCIUM 7.5*   < > 8.0*   < > 8.5*   < > 8.5*   < > 8.3* 8.2* 8.6*  MG 1.9  --  2.2  --  2.0  --   --   --   --   --   --   PHOS  --   --   --   --   --   --  3.0  --  3.4 3.5 3.7   < > = values in this interval not displayed.    Liver Function Tests: Recent Labs  Lab 10/21/19 2202 10/25/19 0454 10/26/19 0544 10/27/19 0400 10/28/19 0552  AST 58*  --   --   --   --   ALT 39  --   --   --   --  ALKPHOS 321*  --   --   --   --   BILITOT 1.1  --   --   --   --   PROT 6.3*  --   --   --   --   ALBUMIN 2.4* 2.8* 2.7* 2.9* 2.8*   No results for input(s): LIPASE, AMYLASE in the last 168 hours. No results for input(s): AMMONIA in the last 168 hours.  CBC: Recent Labs  Lab 10/21/19 1731 10/22/19 1256 10/23/19 0413 10/24/19 0533 10/27/19 0400  WBC 7.5 5.9 7.2 6.3 8.0  HGB 10.0* 8.6* 9.3* 9.4* 9.5*  HCT 31.2* 26.6* 29.0* 28.6* 28.7*  MCV 99.4 100.0 98.0 98.6 97.3  PLT 303 261 282 277 299    Cardiac Enzymes: No results for input(s): CKTOTAL, CKMB, CKMBINDEX, TROPONINI in the last 168 hours.  BNP: Invalid input(s): POCBNP  CBG: Recent Labs  Lab 10/27/19 0732 10/27/19 1141 10/27/19 1707 10/27/19 2104 10/28/19 0747  GLUCAP 150* 262* 263* 121* 115*    Microbiology: Results for orders placed or performed during the hospital encounter of 10/21/19  Respiratory Panel by RT PCR (Flu A&B, Covid) - Nasopharyngeal Swab     Status: None   Collection Time: 10/21/19  8:40 PM   Specimen: Nasopharyngeal Swab  Result Value Ref Range Status   SARS Coronavirus 2 by RT PCR NEGATIVE NEGATIVE Final    Comment: (NOTE) SARS-CoV-2 target nucleic acids are NOT DETECTED.  The SARS-CoV-2 RNA is generally detectable in upper  respiratoy specimens during the acute phase of infection. The lowest concentration of SARS-CoV-2 viral copies this assay can detect is 131 copies/mL. A negative result does not preclude SARS-Cov-2 infection and should not be used as the sole basis for treatment or other patient management decisions. A negative result may occur with  improper specimen collection/handling, submission of specimen other than nasopharyngeal swab, presence of viral mutation(s) within the areas targeted by this assay, and inadequate number of viral copies (<131 copies/mL). A negative result must be combined with clinical observations, patient history, and epidemiological information. The expected result is Negative.  Fact Sheet for Patients:  PinkCheek.be  Fact Sheet for Healthcare Providers:  GravelBags.it  This test is no t yet approved or cleared by the [place] FDA and  has been authorized for detection and/or diagnosis of SARS-CoV-2 by FDA under an Emergency Use Authorization (EUA). This EUA will remain  in effect (meaning this test can be used) for the duration of the COVID-19 declaration under Section 564(b)(1) of the Act, 21 U.S.C. section 360bbb-3(b)(1), unless the authorization is terminated or revoked sooner.     Influenza A by PCR NEGATIVE NEGATIVE Final   Influenza B by PCR NEGATIVE NEGATIVE Final    Comment: (NOTE) The Xpert Xpress SARS-CoV-2/FLU/RSV assay is intended as an aid in  the diagnosis of influenza from Nasopharyngeal swab specimens and  should not be used as a sole basis for treatment. Nasal washings and  aspirates are unacceptable for Xpert Xpress SARS-CoV-2/FLU/RSV  testing.  Fact Sheet for Patients: PinkCheek.be  Fact Sheet for Healthcare Providers: GravelBags.it  This test is not yet approved or cleared by the [place] FDA and  has been  authorized for detection and/or diagnosis of SARS-CoV-2 by  FDA under an Emergency Use Authorization (EUA). This EUA will remain  in effect (meaning this test can be used) for the duration of the  Covid-19 declaration under Section 564(b)(1) of the Act, 21  U.S.C. section 360bbb-3(b)(1), unless the authorization is  terminated or revoked. Performed at [facility] ([place]), [address] [place], [place] [address]     Coagulation Studies: No results for input(s): LABPROT, INR in the last 72 hours.  Urinalysis: No results for input(s): COLORURINE, LABSPEC, PHURINE, GLUCOSEU, HGBUR, BILIRUBINUR, KETONESUR, PROTEINUR, UROBILINOGEN, NITRITE, LEUKOCYTESUR in the last 72 hours.  Invalid input(s): APPERANCEUR    Imaging: No results found.   Medications:   . sodium chloride     . amiodarone  200 mg Oral Daily  . apixaban  5 mg Oral BID  . DULoxetine  30 mg Oral Daily  . ezetimibe  10 mg Oral Daily  . insulin aspart  0-15 Units Subcutaneous TID WC  . insulin aspart  2 Units Subcutaneous TID WC  . metoprolol succinate  12.5 mg Oral Daily  . rosuvastatin  10 mg Oral Daily  . sodium chloride flush  3 mL Intravenous Q12H  . spironolactone  25 mg Oral Daily  . torsemide  80 mg Oral Daily   sodium chloride, acetaminophen, ondansetron (ZOFRAN) IV, sodium chloride, sodium chloride flush  Assessment/ Plan:  Craig Koch is a 64 y.o.  male gentleman with a history of CKD stage IIIb, follows up with Dr. Holley Raring as outpatient, was sent to the ED for bilateral lower extremity edema, volume overload, concern for cardiorenal syndrome.   He also has past medical history of diabetes, hypertension, high lipids, and CHF.  # CKD Stage 111b # Acute on Chronic CHF  Creatinine 2.01/GFR 34 on 10/21/2019 BNP : 2,099  -Bilateral lower extremity edema improved Patient is on Torsemide We will follow up as outpatient if patient is getting discharged today  # Hypokalemia Potassium 3.5  today  #Diabetes with CKD HbA1C 8.1 on 10/22/2019 Blood glucose readings within acceptable range On sliding scale Insulin  #Hypertension -Normotensive today  #Anemia of CKD Hemoglobin 9.5 Continue oral iron supplements as outpatient   LOS: 6 Craig Koch 10/25/20218:36 AM

## 2019-10-28 NOTE — Progress Notes (Signed)
Craig Koch  A and O x 4 VSS. Pt tolerating diet well. No complaints of pain or nausea. IV removed intact, prescriptions given. Pt voices understanding of discharge instructions with no further questions. Pt discharged HOME via wheelchair with WIFE.   Allergies as of 10/28/2019      Reactions   Promethazine Other (See Comments)   QT prolongation.    Trazodone And Nefazodone Other (See Comments)   QT prolongation.    Lipitor [atorvastatin Calcium] Other (See Comments)   Aches.  Tolerated crestor.       Medication List    TAKE these medications   albuterol 108 (90 Base) MCG/ACT inhaler Commonly known as: VENTOLIN HFA Inhale 2 puffs into the lungs every 6 (six) hours as needed for wheezing or shortness of breath.   amiodarone 200 MG tablet Commonly known as: PACERONE Take 1 tablet (200 mg total) by mouth daily.   apixaban 5 MG Tabs tablet Commonly known as: Eliquis Take 1 tablet (5 mg total) by mouth 2 (two) times daily.   DULoxetine 30 MG capsule Commonly known as: CYMBALTA TAKE 1 CAPSULE BY MOUTH EVERY DAY What changed: how much to take   ezetimibe 10 MG tablet Commonly known as: ZETIA Take 1 tablet (10 mg total) by mouth daily.   ferrous sulfate 325 (65 FE) MG tablet TAKE 1 TABLET (325 MG TOTAL) BY MOUTH 2 (TWO) TIMES DAILY WITH A MEAL.   folic acid 553 MCG tablet Commonly known as: FOLVITE Take 400 mcg by mouth daily.   HumaLOG 100 UNIT/ML injection Generic drug: insulin lispro Inject 10-15 Units into the skin 3 (three) times daily. Patient takes 10 unit TID with meals plus additional units if needed based on glucose   insulin detemir 100 UNIT/ML injection Commonly known as: LEVEMIR Inject 0.2 mLs (20 Units total) into the skin at bedtime. What changed:   how much to take  additional instructions   ipratropium-albuterol 0.5-2.5 (3) MG/3ML Soln Commonly known as: DUONEB Take 3 mLs by nebulization 4 (four) times daily.   metoprolol succinate 25 MG 24 hr  tablet Commonly known as: TOPROL-XL TAKE 1/2 TABLET BY MOUTH EVERY DAY   multivitamin Tabs tablet Take 1 tablet by mouth at bedtime.   mupirocin ointment 2 % Commonly known as: Bactroban Apply 1 application topically 2 (two) times daily. Affected areas   Pepto-Bismol 262 MG/15ML suspension Generic drug: bismuth subsalicylate Take 30 mLs by mouth every 6 (six) hours as needed for diarrhea or loose stools.   potassium chloride 10 MEQ tablet Commonly known as: KLOR-CON TAKE 1 TABLET BY MOUTH EVERY DAY   promethazine 25 MG tablet Commonly known as: PHENERGAN Take 25 mg by mouth every 6 (six) hours as needed for nausea or vomiting.   rosuvastatin 10 MG tablet Commonly known as: CRESTOR TAKE 1 TABLET (10 MG TOTAL) BY MOUTH DAILY. What changed: See the new instructions.   spironolactone 25 MG tablet Commonly known as: ALDACTONE Take 1 tablet (25 mg total) by mouth daily.   torsemide 20 MG tablet Commonly known as: DEMADEX Take 4 tablets (80 mg total) by mouth daily. What changed: how much to take       Vitals:   10/28/19 0902 10/28/19 1117  BP: 107/61   Pulse: 83   Resp: 20   Temp: 97.8 F (36.6 C)   SpO2: 91% 98%    Craig Koch Craig Koch

## 2019-10-30 ENCOUNTER — Encounter: Payer: Self-pay | Admitting: Family

## 2019-10-30 ENCOUNTER — Other Ambulatory Visit: Payer: Self-pay

## 2019-10-30 ENCOUNTER — Ambulatory Visit: Payer: Medicare Other | Attending: Family | Admitting: Family

## 2019-10-30 VITALS — BP 105/63 | HR 77 | Resp 18 | Ht 73.0 in | Wt 198.0 lb

## 2019-10-30 DIAGNOSIS — N189 Chronic kidney disease, unspecified: Secondary | ICD-10-CM | POA: Diagnosis not present

## 2019-10-30 DIAGNOSIS — Z7902 Long term (current) use of antithrombotics/antiplatelets: Secondary | ICD-10-CM | POA: Insufficient documentation

## 2019-10-30 DIAGNOSIS — Z951 Presence of aortocoronary bypass graft: Secondary | ICD-10-CM | POA: Insufficient documentation

## 2019-10-30 DIAGNOSIS — Z8249 Family history of ischemic heart disease and other diseases of the circulatory system: Secondary | ICD-10-CM | POA: Diagnosis not present

## 2019-10-30 DIAGNOSIS — I5022 Chronic systolic (congestive) heart failure: Secondary | ICD-10-CM | POA: Insufficient documentation

## 2019-10-30 DIAGNOSIS — Z794 Long term (current) use of insulin: Secondary | ICD-10-CM | POA: Diagnosis not present

## 2019-10-30 DIAGNOSIS — E1122 Type 2 diabetes mellitus with diabetic chronic kidney disease: Secondary | ICD-10-CM | POA: Diagnosis not present

## 2019-10-30 DIAGNOSIS — Z7901 Long term (current) use of anticoagulants: Secondary | ICD-10-CM | POA: Insufficient documentation

## 2019-10-30 DIAGNOSIS — C3490 Malignant neoplasm of unspecified part of unspecified bronchus or lung: Secondary | ICD-10-CM | POA: Diagnosis not present

## 2019-10-30 DIAGNOSIS — Z888 Allergy status to other drugs, medicaments and biological substances status: Secondary | ICD-10-CM | POA: Diagnosis not present

## 2019-10-30 DIAGNOSIS — I130 Hypertensive heart and chronic kidney disease with heart failure and stage 1 through stage 4 chronic kidney disease, or unspecified chronic kidney disease: Secondary | ICD-10-CM | POA: Insufficient documentation

## 2019-10-30 DIAGNOSIS — Z992 Dependence on renal dialysis: Secondary | ICD-10-CM | POA: Diagnosis not present

## 2019-10-30 DIAGNOSIS — F17210 Nicotine dependence, cigarettes, uncomplicated: Secondary | ICD-10-CM | POA: Diagnosis not present

## 2019-10-30 DIAGNOSIS — Z79899 Other long term (current) drug therapy: Secondary | ICD-10-CM | POA: Insufficient documentation

## 2019-10-30 DIAGNOSIS — I10 Essential (primary) hypertension: Secondary | ICD-10-CM

## 2019-10-30 DIAGNOSIS — N1832 Chronic kidney disease, stage 3b: Secondary | ICD-10-CM

## 2019-10-30 NOTE — Patient Instructions (Addendum)
-  Biotene, sugar free candy/gum/mints for mouth dryness -If gaining more than 2lbs overnight or 5lbs in a week please call for appointment.  -Use ace wraps for leg swelling -Follow up PRN for fluid overload -[place] ([place]) possible resource for taking you to and from [employer].  [phone] [email or web address]?option=com_content&view=article&[email or web address]=287  -Limit to about 48oz of fluid a day

## 2019-10-30 NOTE — Progress Notes (Signed)
Patient ID: Craig Koch, male    DOB: 1955-09-28, 64 y.o.   MRN: [id number]  Congestive Heart Failure Associated symptoms include fatigue and shortness of breath. Pertinent negatives include no abdominal pain, chest pain or palpitations.  Shortness of Breath Associated symptoms include leg swelling (Bilateral legs). Pertinent negatives include no abdominal pain, chest pain or neck pain.    Craig Koch is a 64 y/o male with a history of lung cancer, CAE, DM, hyperlipidemia, HTN, anxiety, pleural effusions, recent tobacco use and chronic heart failure.   Echo report from 03/15/19 reviewed and showed an EF of 35-40% along with moderately elevated PA pressure of 48.4 mmHg and moderate Craig.  Admitted 10/21/2019 for renal failure and d/c 10/28/19.During hospitalization patient given IV lasix and his torsemide was increased to 80mg  daily and spironolactone 25mg  daily added.   He presents today for a follow-up visit with a chief complaint of moderate shortness of breath with little exertion. He describes this as chronic in nature having been present for several years. He has associated fatigue, cough, pedal edema, diarrhea, light-headedness, weakness and slight weight gain along with this. He denies any difficulty sleeping, abdominal distention, palpitations or chest pain.    Reports feeling overwhelmed with the doctors appointments and guilt that his wife was having to take time off work to take him to appointments. Patient's daughter presented with him today as a support.   Past Medical History:  Diagnosis Date  . Anxiety   . Arthritis   . CHF (congestive heart failure) ([place])   . Coronary artery disease   . Diabetes mellitus   . Dyslipidemia   . Hx of CABG   . Hyperlipidemia   . Hypertension   . Malignant neoplasm of unspecified part of unspecified bronchus or lung ([place]) 08/2018   Immunotherapy  . Pleural effusion    Past Surgical History:  Procedure Laterality Date  . CATARACT  EXTRACTION Left 09/2015  . CHEST TUBE INSERTION Left 10/01/2018   Procedure: INSERTION PLEURAL DRAINAGE CATHETER;  Surgeon: [name], MD;  Location: [facility] ORS;  Service: Thoracic;  Laterality: Left;  . CORONARY ARTERY BYPASS GRAFT  2004   (CABG with LIMA to the  LAD, SVG to OM2/OM3, SVG  to diag  . DIALYSIS/PERMA CATHETER INSERTION N/A 03/14/2019   Procedure: DIALYSIS/PERMA CATHETER INSERTION;  Surgeon: Algernon Huxley, MD;  Location: [facility];  Service: Cardiovascular;  Laterality: N/A;  . DIALYSIS/PERMA CATHETER REMOVAL N/A 05/28/2019   Procedure: DIALYSIS/PERMA CATHETER REMOVAL;  Surgeon: Katha Cabal, MD;  Location: [facility];  Service: Cardiovascular;  Laterality: N/A;  . Left ankle surgery     repair of fracture  . Right lower leg surgery     rod  . TEMPORARY DIALYSIS CATHETER N/A 03/11/2019   Procedure: TEMPORARY DIALYSIS CATHETER;  Surgeon: Algernon Huxley, MD;  Location: [facility];  Service: Cardiovascular;  Laterality: N/A;   Family History  Problem Relation Age of Onset  . Dementia Mother   . Heart disease Father   . Colon cancer Neg Hx   . Prostate cancer Neg Hx   . Diabetes Neg Hx    Social History   Tobacco Use  . Smoking status: Current Every Day Smoker    Packs/day: 0.20    Years: 45.00    Pack years: 9.00    Types: Cigarettes    Last attempt to quit: 03/05/2019    Years since quitting: 0.6  . Smokeless tobacco: Former  User    Types: Snuff  Substance Use Topics  . Alcohol use: Not Currently    Alcohol/week: 6.0 standard drinks    Types: 6 Cans of beer per week    Comment: occ, average 6 pack in a week   Allergies  Allergen Reactions  . Promethazine Other (See Comments)    QT prolongation.   . Trazodone And Nefazodone Other (See Comments)    QT prolongation.   . Lipitor [Atorvastatin Calcium] Other (See Comments)    Aches.  Tolerated crestor.    Prior to Admission medications   Medication Sig Start Date End Date Taking?  Authorizing Provider  albuterol (VENTOLIN HFA) 108 (90 Base) MCG/ACT inhaler Inhale 2 puffs into the lungs every 6 (six) hours as needed for wheezing or shortness of breath. 04/01/19 09/10/19 Yes Wyvonnia Dusky, MD  amiodarone (PACERONE) 200 MG tablet TAKE 1 TABLET BY MOUTH EVERY DAY 07/16/19  Yes Tonia Ghent, MD  apixaban (ELIQUIS) 5 MG TABS tablet Take 1 tablet (5 mg total) by mouth 2 (two) times daily. 05/10/19  Yes Visser, Jacquelyn D, PA-C  bismuth subsalicylate (PEPTO-BISMOL) 262 MG/15ML suspension Take 30 mLs by mouth every 6 (six) hours as needed for diarrhea or loose stools. 08/30/19  Yes Tonia Ghent, MD  clopidogrel (PLAVIX) 75 MG tablet TAKE 1 TABLET BY MOUTH EVERY DAY 07/16/19  Yes Tonia Ghent, MD  DULoxetine (CYMBALTA) 30 MG capsule Take 1 capsule (30 mg total) by mouth daily. 04/01/19 09/10/19 Yes Wyvonnia Dusky, MD  ezetimibe (ZETIA) 10 MG tablet Take 1 tablet (10 mg total) by mouth daily. 05/10/19 09/10/19 Yes Visser, Jacquelyn D, PA-C  ferrous sulfate 325 (65 FE) MG tablet TAKE 1 TABLET (325 MG TOTAL) BY MOUTH 2 (TWO) TIMES DAILY WITH A MEAL. 07/16/19 09/10/19 Yes Tonia Ghent, MD  folic acid (FOLVITE) 1 MG tablet Take 1 tablet (1 mg total) by mouth daily. Patient taking differently: Take 400 mcg by mouth daily.  03/20/19  Yes Nicole Kindred A, DO  furosemide (LASIX) 40 MG tablet If lightheaded or SBP <100, then take 80mg  lasix a day.  If not lightheaded and SBP >100, then take 120mg  lasix a day. 08/26/19  Yes Tonia Ghent, MD  insulin aspart (NOVOLOG) 100 UNIT/ML injection Inject 0-8 Units into the skin as directed. Take 0 units if CBG 70-150 Take 1 units if CBG 151-200 Take 2 units if CBG 201-250 Take 3 units if CBG 251-300 Take 4 units if CBG 301-350 Take 6 units if CBG 351-400 If CBG > 400, give 8 units and call MD 04/15/19  Yes Tonia Ghent, MD  insulin detemir (LEVEMIR) 100 UNIT/ML injection Inject 0.2 mLs (20 Units total) into the skin at bedtime. 06/14/19   Yes Tonia Ghent, MD  ipratropium-albuterol (DUONEB) 0.5-2.5 (3) MG/3ML SOLN Take 3 mLs by nebulization 4 (four) times daily. 06/12/19  Yes [provider]  metoprolol succinate (TOPROL-XL) 25 MG 24 hr tablet Take 0.5 tablets (12.5 mg total) by mouth daily. 04/15/19  Yes Tonia Ghent, MD  Multiple Vitamin (MULTIVITAMIN WITH MINERALS) TABS tablet Take 1 tablet by mouth at bedtime.   Yes [provider]  multivitamin (RENA-VIT) TABS tablet Take 1 tablet by mouth at bedtime. 03/19/19  Yes Nicole Kindred A, DO  potassium chloride (KLOR-CON) 10 MEQ tablet Take 1 tablet (10 mEq total) by mouth daily. 08/25/19  Yes Tonia Ghent, MD  rosuvastatin (CRESTOR) 10 MG tablet TAKE 1 TABLET (10 MG TOTAL)  BY MOUTH DAILY. 01/07/19  Yes Tonia Ghent, MD    Review of Systems  Constitutional: Positive for fatigue. Negative for appetite change.  HENT: Negative for congestion and postnasal drip.   Eyes: Negative.   Respiratory: Positive for cough and shortness of breath.   Cardiovascular: Positive for leg swelling (Bilateral legs). Negative for chest pain and palpitations.  Gastrointestinal: Negative for abdominal distention, abdominal pain and diarrhea (last couple of weeks).  Endocrine: Negative.   Genitourinary: Negative.   Musculoskeletal: Positive for back pain. Negative for neck pain.  Skin: Negative.   Allergic/Immunologic: Negative.   Neurological: Positive for weakness and light-headedness. Negative for dizziness.  Hematological: Negative for adenopathy. Bruises/bleeds easily.  Psychiatric/Behavioral: Negative for dysphoric mood and sleep disturbance (sleeping on 2 pillow). The patient is not nervous/anxious.    Vitals:   10/30/19 1255 10/30/19 1328  BP: (!) 85/51 105/63  Pulse: 77   Resp: 18   SpO2: 98%   Weight: 198 lb (89.8 kg)   Height: 6\' 1"  (1.854 m)    Wt Readings from Last 3 Encounters:  10/30/19 198 lb (89.8 kg)  10/28/19 (P) 207 lb 11.2 oz (94.2 kg)   10/17/19 216 lb (98 kg)    Lab Results  Component Value Date   CREATININE 1.93 (H) 10/28/2019   CREATININE 1.96 (H) 10/27/2019   CREATININE 1.63 (H) 10/26/2019    Physical Exam Vitals and nursing note reviewed.  Constitutional:      Appearance: Normal appearance.  HENT:     Head: Normocephalic and atraumatic.  Cardiovascular:     Rate and Rhythm: Normal rate and regular rhythm.  Pulmonary:     Effort: Pulmonary effort is normal. No respiratory distress.     Breath sounds: No wheezing or rales.  Abdominal:     General: There is no distension.     Palpations: Abdomen is soft.  Musculoskeletal:        General: No tenderness.     Cervical back: Neck supple.     Right lower leg: Edema (2+ soft pitting) present.     Left lower leg: Edema (2+ soft pitting) present.  Skin:    General: Skin is warm and dry.  Neurological:     General: No focal deficit present.     Mental Status: He is alert and oriented to person, place, and time.  Psychiatric:        Mood and Affect: Mood normal.        Behavior: Behavior normal.        Thought Content: Thought content normal.   Assessment & Plan:  1: Chronic heart failure with reduced ejection fraction- - NYHA class III - mildly fluid overloaded with Fluid noted in legs - weighing daily and he was instructed to call for an overnight weight gain of > 2 pounds or a weekly weight gain of >5 pounds - weight up 14 pounds since last visit here 6 weeks ago; does feel like his swelling has improved since recent admission - Trying to reduce fluid intake to approximately 60oz per day - not adding salt to his food and the importance of following a 2000mg  sodium diet was reviewed -Patient reports difficulty with following dash diet due to eating mostly pre-packaged meals while his wife works. - saw cardiology Gilford Rile) 10/17/19  - Suggested ace wraps for leg compression today due to swelling and inability to wear compression socks - BNP 10/21/19 was  2099.0 - not interested in covid vaccines  2: HTN- -  BP low today, rechecked 105/63 after resting for a bit - Sees PCP Damita Dunnings on Friday 11/01/19 - BMP 10/28/19 reviewed and showed sodium 138, potassium 3.5, creatinine 1.93 and GFR 38 (was then told by cardiology to take extra 53meq potassium over 2 days); getting labs rechecked today  3: DM with CKD- - A1c 10/22/19 was 8.1% - saw nephrology Holley Raring) 10/21/19 - nonfasting glucose at office today was 247  4: Stage IV lung cancer-  - saw oncology Tasia Catchings) while in the hospital - palliative care visit done 08/20/19 - saw pulmonology Lanney Gins) on 09/13/19    Medication list reviewed.   Due to numerous other provider appointments, patient and daughter opt to not make another appointment at this time. Advised to continue weighing daily and to call us in the future if they'd like to make another appointment. Patient and daughter were comfortable with this plan.

## 2019-11-01 ENCOUNTER — Ambulatory Visit (INDEPENDENT_AMBULATORY_CARE_PROVIDER_SITE_OTHER): Payer: Medicare Other | Admitting: Family Medicine

## 2019-11-01 ENCOUNTER — Other Ambulatory Visit: Payer: Self-pay | Admitting: Family Medicine

## 2019-11-01 ENCOUNTER — Encounter: Payer: Self-pay | Admitting: Family Medicine

## 2019-11-01 ENCOUNTER — Other Ambulatory Visit: Payer: Self-pay

## 2019-11-01 VITALS — BP 110/60 | HR 78 | Temp 96.4°F | Ht 73.0 in | Wt 205.6 lb

## 2019-11-01 DIAGNOSIS — I5043 Acute on chronic combined systolic (congestive) and diastolic (congestive) heart failure: Secondary | ICD-10-CM | POA: Diagnosis not present

## 2019-11-01 DIAGNOSIS — E118 Type 2 diabetes mellitus with unspecified complications: Secondary | ICD-10-CM | POA: Diagnosis not present

## 2019-11-01 DIAGNOSIS — N184 Chronic kidney disease, stage 4 (severe): Secondary | ICD-10-CM

## 2019-11-01 DIAGNOSIS — I10 Essential (primary) hypertension: Secondary | ICD-10-CM

## 2019-11-01 LAB — CBC WITH DIFFERENTIAL/PLATELET
Basophils Absolute: 0.1 10*3/uL (ref 0.0–0.1)
Basophils Relative: 0.9 % (ref 0.0–3.0)
Eosinophils Absolute: 0.1 10*3/uL (ref 0.0–0.7)
Eosinophils Relative: 1.4 % (ref 0.0–5.0)
HCT: 30.4 % — ABNORMAL LOW (ref 39.0–52.0)
Hemoglobin: 10.1 g/dL — ABNORMAL LOW (ref 13.0–17.0)
Lymphocytes Relative: 9.1 % — ABNORMAL LOW (ref 12.0–46.0)
Lymphs Abs: 0.6 10*3/uL — ABNORMAL LOW (ref 0.7–4.0)
MCHC: 33.2 g/dL (ref 30.0–36.0)
MCV: 96 fl (ref 78.0–100.0)
Monocytes Absolute: 0.8 10*3/uL (ref 0.1–1.0)
Monocytes Relative: 12.6 % — ABNORMAL HIGH (ref 3.0–12.0)
Neutro Abs: 4.8 10*3/uL (ref 1.4–7.7)
Neutrophils Relative %: 76 % (ref 43.0–77.0)
Platelets: 280 10*3/uL (ref 150.0–400.0)
RBC: 3.17 Mil/uL — ABNORMAL LOW (ref 4.22–5.81)
RDW: 21.3 % — ABNORMAL HIGH (ref 11.5–15.5)
WBC: 6.3 10*3/uL (ref 4.0–10.5)

## 2019-11-01 LAB — BASIC METABOLIC PANEL
BUN: 47 mg/dL — ABNORMAL HIGH (ref 6–23)
CO2: 30 mEq/L (ref 19–32)
Calcium: 8.4 mg/dL (ref 8.4–10.5)
Chloride: 97 mEq/L (ref 96–112)
Creatinine, Ser: 2.11 mg/dL — ABNORMAL HIGH (ref 0.40–1.50)
GFR: 32.44 mL/min — ABNORMAL LOW (ref >60.00)
Glucose, Bld: 140 mg/dL — ABNORMAL HIGH (ref 70–99)
Potassium: 3.8 mEq/L (ref 3.5–5.1)
Sodium: 139 mEq/L (ref 135–145)

## 2019-11-01 MED ORDER — INSULIN DETEMIR 100 UNIT/ML ~~LOC~~ SOLN
4.0000 [IU] | Freq: Every day | SUBCUTANEOUS | Status: AC
Start: 2019-11-01 — End: ?

## 2019-11-01 NOTE — Patient Instructions (Signed)
Go to the lab on the way out.   If you have mychart we'll likely use that to update you.    [place]'t change your meds for now.  If you have weight gain then call the clinic.   Take care.  Glad to see you.

## 2019-11-01 NOTE — Progress Notes (Signed)
This visit occurred during the SARS-CoV-2 public health emergency.  Safety protocols were in place, including screening questions prior to the visit, additional usage of staff PPE, and extensive cleaning of exam room while observing appropriate contact time as indicated for disinfecting solutions.  Was admitted for diuresis.  Fluid status is better than prev, better than prior to admission.  Still on spironolactone 25 milligrams and torsemide 80mg  a day.  3mEq potassium qAM.  Breathing is "alright", still with SOB with exertion but better than prev.  BLE edema d/w pt, improved per family report.    Still have mealtime insulin but no recent levemir use or need.  D/w pt.    Has renal f/u pending for next week.  Dr. Holley Raring.    Meds, vitals, and allergies reviewed.   ROS: Per HPI unless specifically indicated in ROS section   nad ncat He looks better today, compared from prev.   Neck supple, no LA Dec BS LLL at baseline.  No inc wob rrr Abdomen soft, not tender. R foot with healing 1cm blister.  L foot with healing 4cm blister.  Neither appear infected.  Both were covered with Neosporin and a nonstick bandage and then wrapped with coban from the distal foot up to the mid calf.  Is not a true Unna boot but it should provide some help with distal swelling and blister healing.  Patient/family can remove it and replace it daily as needed.

## 2019-11-03 ENCOUNTER — Other Ambulatory Visit: Payer: Self-pay | Admitting: Family Medicine

## 2019-11-03 NOTE — Assessment & Plan Note (Signed)
Would not use Levemir given previous nocturnal hypoglycemia recently reported by patient.  Continue mealtime insulin.  Update me as needed.  He agrees.  Foot care discussed with patient. R foot with healing 1cm blister.  L foot with healing 4cm blister.  Neither appear infected.  Both were covered with Neosporin and a nonstick bandage and then wrapped with coban from the distal foot up to the mid calf.  Is not a true Unna boot but it should provide some help with distal swelling and blister healing.  Patient/family can remove it and replace it daily as needed. If he can keep the swelling controlled I think these will likely continue healing over.  Routine cautions given to patient.

## 2019-11-03 NOTE — Assessment & Plan Note (Signed)
History of, with renal follow-up pending.  See notes on labs.  At this point still okay for outpatient follow-up.  Would continue spironolactone potassium and torsemide at this point.

## 2019-11-03 NOTE — Assessment & Plan Note (Signed)
Continue 80 mg torsemide in 25 mg spironolactone with 10 mEq of potassium daily.  See notes on follow-up labs.  His breathing is clearly improved.

## 2019-11-04 ENCOUNTER — Telehealth: Payer: Self-pay

## 2019-11-04 NOTE — Telephone Encounter (Signed)
Sig updated. Prescription sent. Thanks.

## 2019-11-04 NOTE — Telephone Encounter (Signed)
Message left for patient to check in and offer to schedule a follow up visit. Call back information provided.

## 2019-11-04 NOTE — Telephone Encounter (Signed)
Refill request Humalog Last office visit 11/01/19 Refill request does not match the medication list. Please confirm directions

## 2019-11-05 NOTE — Progress Notes (Signed)
Virtual Visit via Telephone Note   This visit type was conducted due to national recommendations for restrictions regarding the COVID-19 Pandemic (e.g. social distancing) in an effort to limit this patient's exposure and mitigate transmission in our community.  Due to his co-morbid illnesses, this patient is at least at moderate risk for complications without adequate follow up.  This format is felt to be most appropriate for this patient at this time.  The patient did not have access to video technology/had technical difficulties with video requiring transitioning to audio format only (telephone).  All issues noted in this document were discussed and addressed.  No physical exam could be performed with this format.  Please refer to the patient's chart for his  consent to telehealth for [facility].   Date:  11/06/2019   ID:  Craig Koch, DOB 10/01/1955, MRN [id number] The patient was identified using 2 identifiers.  Patient Location: Home Provider Location: Office/Clinic  PCP:  Tonia Ghent, MD  Cardiologist:  Ida Rogue, MD  Electrophysiologist:  None   Evaluation Performed:  Follow-Up Visit  Chief Complaint: Hospital follow-up  History of Present Illness:    Craig Koch is a 64 y.o. 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   Stress test 2010 with EF 45%, fixed defect involving inferior wall consistent with scar versus hibernating myocardium. Per his report also had stress test on 212 which was normal.  Admitted to [facility] August 2020 with large pleural effusion and underwent thoracentesis. Cytology was positive for adenocarcinoma.  Hospitalized at [facility] 02/23/2019 found a new onset atrial flutter with RVR, AKI requiring temporary HD, NSTEMI, new HFrEF. Started on amiodarone. Digoxin was started but discontinued due to renal failure. High-sensitivity troponin was  suspicious for graft occlusion. Echo with moderately reduced LVEF. Catheterization deferred due to renal function. Temporary dialysis catheter placed and removed by end of admission with tunneled right IJ HD catheter placed.  He was seen in clinic 10/17/2019. His HD had been discontinued and catheter removed. He was started on p.o. Lasix. He had a PET scan 07/18/2019 with no evidence of recurrent lung cancer or metastatic disease that was treated with doxycycline and prednisone for dense airspace disease in RLL concerning for pneumonia versus pneumonitis. CXR 07/29/2019 with worsening mid right and lower lung air space opacities, started on Levaquin.  In clinic 07/23/2019 his Lasix was increased due to lower extremity edema. He was seen in the ED 10/04/2019 for nosebleed in setting of Plavix and Eliquis use.  He was seen in clinic 10/17/2019. Noted increasing edema in his legs, feet, left arm, stomach over the last 6 weeks as well as dyspnea on exertion. He reported weight was maintaining 195 pounds over the weekend it suddenly increased to 15 pounds. He had been taking Lasix 40 mg twice daily. He was transitioned to torsemide. He was seen by nephrology 10/21/2019 and recommended to proceed to the ED for evaluation. As his volume status had not improved on torsemide.  Admitted to [facility] 10/21/2019-10/28/2019. He was started on Lasix infusion by nephrology with net negative greater than 6 L and also received IV albumin. His torsemide was increased to 80 mg daily and spironolactone 25 mg daily added.  He was seen since discharge by Darylene Price, NP in the [facility] heart failure clinic as well as by his primary care provider Dr. Damita Dunnings.  He had repeat lab  work 10/23/2019 with creatinine 2.11, GFR 32.44, K3.8, hemoglobin 10.1.  Follow-up via telephone visit today.  He reports swelling is much improved compared to prior to hospitalization.  Still has mild swelling in his abdomen but no swelling is arm.  His leg  swelling is better in the morning though worse and again goes up to his knee.  He has been compliant with all of his diuretic therapies. He does prop his legs up at night. During the day, tells me he sits in the recliner with his feet up. He tells me his blisters are "looking alright". He and his family have been bandaging them. No signs of infection and "  He reports no shortness of breath at rest. Endorses dyspnea on exertion which is slowly improving since hospital discharge. Weighs daily on his home scale and reports this has been trending down.  He has upcoming appointment with Dr. Holley Raring (nephrology) tomorrow  Tells me he has been eating a lot of ice.  We reviewed indication for less than 2 L fluid restriction low-salt diet.  The patient does not have symptoms concerning for COVID-19 infection (fever, chills, cough, or new shortness of breath).    Past Medical History:  Diagnosis Date  . Anxiety   . Arthritis   . CHF (congestive heart failure) ([place])   . Coronary artery disease   . Diabetes mellitus   . Dyslipidemia   . Hx of CABG   . Hyperlipidemia   . Hypertension   . Malignant neoplasm of unspecified part of unspecified bronchus or lung ([place]) 08/2018   Immunotherapy  . Pleural effusion    Past Surgical History:  Procedure Laterality Date  . CATARACT EXTRACTION Left 09/2015  . CHEST TUBE INSERTION Left 10/01/2018   Procedure: INSERTION PLEURAL DRAINAGE CATHETER;  Surgeon: [name], MD;  Location: [facility] ORS;  Service: Thoracic;  Laterality: Left;  . CORONARY ARTERY BYPASS GRAFT  2004   (CABG with LIMA to the  LAD, SVG to OM2/OM3, SVG  to diag  . DIALYSIS/PERMA CATHETER INSERTION N/A 03/14/2019   Procedure: DIALYSIS/PERMA CATHETER INSERTION;  Surgeon: Algernon Huxley, MD;  Location: [place] CV LAB;  Service: Cardiovascular;  Laterality: N/A;  . DIALYSIS/PERMA CATHETER REMOVAL N/A 05/28/2019   Procedure: DIALYSIS/PERMA CATHETER REMOVAL;  Surgeon: Katha Cabal, MD;   Location: [place] [facility];  Service: Cardiovascular;  Laterality: N/A;  . Left ankle surgery     repair of fracture  . Right lower leg surgery     rod  . TEMPORARY DIALYSIS CATHETER N/A 03/11/2019   Procedure: TEMPORARY DIALYSIS CATHETER;  Surgeon: Algernon Huxley, MD;  Location: [place] [facility];  Service: Cardiovascular;  Laterality: N/A;     Current Meds  Medication Sig  . albuterol (VENTOLIN HFA) 108 (90 Base) MCG/ACT inhaler Inhale 2 puffs into the lungs every 6 (six) hours as needed for wheezing or shortness of breath.  [name] amiodarone (PACERONE) 200 MG tablet Take 1 tablet (200 mg total) by mouth daily.  [name] apixaban (ELIQUIS) 5 MG TABS tablet Take 1 tablet (5 mg total) by mouth 2 (two) times daily.  [name] bismuth subsalicylate (PEPTO-BISMOL) 262 MG/15ML suspension Take 30 mLs by mouth every 6 (six) hours as needed for diarrhea or loose stools.  . DULoxetine (CYMBALTA) 30 MG capsule TAKE 1 CAPSULE BY MOUTH EVERY DAY  . ezetimibe (ZETIA) 10 MG tablet Take 1 tablet (10 mg total) by mouth daily.  . ferrous sulfate 325 (65 FE) MG tablet TAKE 1  TABLET (325 MG TOTAL) BY MOUTH 2 (TWO) TIMES DAILY WITH A MEAL.  . folic acid (FOLVITE) 268 MCG tablet Take 400 mcg by mouth daily.  . insulin detemir (LEVEMIR) 100 UNIT/ML injection Inject 0.04-0.05 mLs (4-5 Units total) into the skin at bedtime. if glucose over 190 mg/dl  . insulin lispro (HUMALOG) 100 UNIT/ML injection Inject 10-15 Units into the skin 3 (three) times daily. Patient takes 10 unit TID with meals plus additional units if needed based on glucose  . ipratropium-albuterol (DUONEB) 0.5-2.5 (3) MG/3ML SOLN Take 3 mLs by nebulization 4 (four) times daily.  . metoprolol succinate (TOPROL-XL) 25 MG 24 hr tablet TAKE 1/2 TABLET BY MOUTH EVERY DAY  . multivitamin (RENA-VIT) TABS tablet Take 1 tablet by mouth at bedtime.  . mupirocin ointment (BACTROBAN) 2 % Apply 1 application topically 2 (two) times daily. Affected areas  . potassium chloride  (KLOR-CON) 10 MEQ tablet TAKE 1 TABLET BY MOUTH EVERY DAY  . promethazine (PHENERGAN) 25 MG tablet Take 25 mg by mouth every 6 (six) hours as needed for nausea or vomiting.  . rosuvastatin (CRESTOR) 10 MG tablet TAKE 1 TABLET (10 MG TOTAL) BY MOUTH DAILY.  [name] spironolactone (ALDACTONE) 25 MG tablet Take 1 tablet (25 mg total) by mouth daily.  [name] torsemide (DEMADEX) 20 MG tablet Take 4 tablets (80 mg total) by mouth daily.    Allergies:   Promethazine, Trazodone and nefazodone, and Lipitor [atorvastatin calcium]   Social History   Tobacco Use  . Smoking status: Current Every Day Smoker    Packs/day: 0.20    Years: 45.00    Pack years: 9.00    Types: Cigarettes    Last attempt to quit: 03/05/2019    Years since quitting: 0.6  . Smokeless tobacco: Former Systems developer    Types: Snuff  Vaping Use  . Vaping Use: Never used  Substance Use Topics  . Alcohol use: Not Currently    Alcohol/week: 6.0 standard drinks    Types: 6 Cans of beer per week    Comment: occ, average 6 pack in a week  . Drug use: No     Family Hx: The patient's family history includes Dementia in his mother; Heart disease in his father. There is no history of Colon cancer, Prostate cancer, or Diabetes.  ROS:   Please see the history of present illness.    Review of Systems  Constitutional: Negative for chills, fever and malaise/fatigue.  Cardiovascular: Positive for dyspnea on exertion and leg swelling. Negative for chest pain, irregular heartbeat, near-syncope, orthopnea, palpitations and syncope.  Respiratory: Negative for cough, shortness of breath and wheezing.   Gastrointestinal: Negative for melena, nausea and vomiting.  Genitourinary: Negative for hematuria.  Neurological: Negative for dizziness, light-headedness and weakness.   All other systems reviewed and are negative.  Prior CV studies:   The following studies were reviewed today:  Echo 03/15/19 1. Left ventricular ejection fraction, by estimation, is 35 to  40%. The  left ventricle has moderately decreased function. The left ventricle  demonstrates regional wall motion abnormalities . The left ventricular  internal cavity size was mildly dilated.  There is mild left ventricular hypertrophy. Left ventricular diastolic  parameters are consistent with Grade II diastolic dysfunction  (pseudonormalization). There is moderate hypokinesis of the left  ventricular, entire inferior wall.   2. Right ventricular systolic function is normal. The right ventricular  size is normal. There is moderately elevated pulmonary artery systolic  pressure. The estimated right ventricular systolic pressure  is 48.4 mmHg.   3. Left atrial size was mildly dilated.   4. The mitral valve is normal in structure. Moderate mitral valve  regurgitation. No evidence of mitral stenosis.   5. The aortic valve is abnormal. Aortic valve regurgitation is mild. Mild  to moderate aortic valve sclerosis/calcification is present, without any  evidence of aortic stenosis.   6. The inferior vena cava is normal in size with greater than 50%  respiratory variability, suggesting right atrial pressure of 3 mmHg.   Labs/Other Tests and Data Reviewed:    EKG:  No ECG reviewed.  Recent Labs: 08/05/2019: TSH 4.742 08/15/2019: Pro B Natriuretic peptide (BNP) 1,476.0 10/21/2019: ALT 39; B Natriuretic Peptide 2,099.0 10/24/2019: Magnesium 2.0 11/01/2019: BUN 47; Creatinine, Ser 2.11; Hemoglobin 10.1; Platelets 280.0; Potassium 3.8; Sodium 139   Recent Lipid Panel Lab Results  Component Value Date/Time   CHOL 85 (L) 05/24/2019 10:42 AM   TRIG 47 05/24/2019 10:42 AM   HDL 41 05/24/2019 10:42 AM   CHOLHDL 2.1 05/24/2019 10:42 AM   CHOLHDL 9.2 03/04/2019 10:59 AM   LDLCALC 32 05/24/2019 10:42 AM   LDLDIRECT 31 05/24/2019 10:42 AM   LDLDIRECT 84.0 08/18/2015 08:50 AM    Wt Readings from Last 3 Encounters:  11/06/19 205 lb (93 kg)  11/01/19 205 lb 9 oz (93.2 kg)  10/30/19 198 lb (89.8 kg)      Objective:    Vital Signs:  BP 113/90 (BP Location: Right Arm, Patient Position: Sitting, Cuff Size: Normal)   Pulse 77   Ht 6\' 1"  (1.854 m)   Wt 205 lb (93 kg)   BMI 27.05 kg/m    VITAL SIGNS:  reviewed  ASSESSMENT & PLAN:    1. Chronic HFrEF EF 35-40% on 03/2019-discharge weight 207.  Weight today down to 204 pounds.  Educated to report weight gain of 2 pounds overnight or 5 pounds in 1 week.  Continue torsemide 80 mg daily, spironolactone 25 mg daily.  Defer changes in diuretic therapy to nephrology.  BMP 11/01/2019 creatinine 2.11, GFR 32.44.  Encouraged to elevate lower extremities when sitting, drink less than 2 L fluid per day, avoid salt.  Reports NYHA III symptoms with dyspnea on exertion which is also likely worsened by deconditioning.  Unable to participate in cardiac rehab as he does not drive.  2. CKD IV/hypoalbuminemia/secondary hyperparathyroidism-has follow-up with Dr. Holley Raring tomorrow.  Will defer changes in diuretic therapy to him.  Have forwarded recent BMP, CBC collected 11/01/2019 by PCP to his office via epic fax function.  3. CAD s/p CABG-stable with no anginal symptoms.  No indication for ischemic evaluation this time.  GDMT includes beta-blocker, statin, Zetia.  No aspirin secondary to chronic anticoagulation.  4. PAF on chronic anticoagulation- Denies bleeding complications and palpitations.  Continue Eliquis 5 mg twice daily.  Does not meet dose reduction criteria.  5. On amiodarone therapy- No signs of toxicity. 10/21/19 normal ALT and AST stable at 58. Continue Amiodarone 200mg  daily.  Plan for repeat TSH at next clinic visit.  6. COPD with ongoing tobacco use- Smoking cessation encouraged. Recommend utilization of [id number]. Continue to follow with primary care and pulmonology. No signs of acute exacerbation.  7. Stage IV metastatic lung adenocarcinoma- In remission. Continue to follow with oncology.   8. Transaminitis- 10/21/19 normal ALT and stable  AST 58. Continue to monitor. Avoid alcohol, Acetaminophen, fried foods.  9. HLD, LDL goal less than 70- 03/04/19 LDL 81. Further escalation of Crestor 10mg  limited by transaminitis.  Continue Zetia 10mg , Crestor 10mg . Plan for repeat lipid panel at next clinic visit.   10. Anemia of chronic disease -recent hemoglobin 10.  Denies bleeding complications.  Continue to follow with primary care, nephrology.   Time:   Today, I have spent 15 minutes with the patient with telehealth technology discussing the above problems.    Medication Adjustments/Labs and Tests Ordered: Current medicines are reviewed at length with the patient today.  Concerns regarding medicines are outlined above.   Tests Ordered: No orders of the defined types were placed in this encounter.  Medication Changes: No orders of the defined types were placed in this encounter.  Follow Up:  In Person in December With Dr. Rockey Situ as previously scheduled  Signed, Loel Dubonnet, NP  11/06/2019 8:42 AM    [place]

## 2019-11-06 ENCOUNTER — Encounter: Payer: Self-pay | Admitting: Family

## 2019-11-06 ENCOUNTER — Other Ambulatory Visit: Payer: Self-pay

## 2019-11-06 ENCOUNTER — Telehealth (INDEPENDENT_AMBULATORY_CARE_PROVIDER_SITE_OTHER): Payer: Medicare Other | Admitting: Family

## 2019-11-06 VITALS — BP 113/90 | HR 77 | Ht 73.0 in | Wt 205.0 lb

## 2019-11-06 DIAGNOSIS — I5022 Chronic systolic (congestive) heart failure: Secondary | ICD-10-CM

## 2019-11-06 DIAGNOSIS — N184 Chronic kidney disease, stage 4 (severe): Secondary | ICD-10-CM

## 2019-11-06 DIAGNOSIS — I25118 Atherosclerotic heart disease of native coronary artery with other forms of angina pectoris: Secondary | ICD-10-CM

## 2019-11-06 DIAGNOSIS — Z79899 Other long term (current) drug therapy: Secondary | ICD-10-CM

## 2019-11-06 DIAGNOSIS — I480 Paroxysmal atrial fibrillation: Secondary | ICD-10-CM

## 2019-11-06 DIAGNOSIS — Z951 Presence of aortocoronary bypass graft: Secondary | ICD-10-CM | POA: Diagnosis not present

## 2019-11-06 DIAGNOSIS — I10 Essential (primary) hypertension: Secondary | ICD-10-CM

## 2019-11-06 DIAGNOSIS — Z7901 Long term (current) use of anticoagulants: Secondary | ICD-10-CM | POA: Diagnosis not present

## 2019-11-06 DIAGNOSIS — R7401 Elevation of levels of liver transaminase levels: Secondary | ICD-10-CM

## 2019-11-06 NOTE — Patient Instructions (Signed)
Medication Instructions:  No medication changes today.  We will defer any changes to your fluid pill to Dr. Holley Raring (nephrology).  *If you need a refill on your cardiac medications before your next appointment, please call your pharmacy*  Lab Work: None ordered today.  Testing/Procedures: None ordered today.  Follow-Up: At [facility], you and your health needs are our priority.  As part of our continuing mission to provide you with exceptional heart care, we have created designated Provider Care Teams.  These Care Teams include your primary Cardiologist (physician) and Advanced Practice Providers (APPs -  Physician Assistants and Nurse Practitioners) who all work together to provide you with the care you need, when you need it.  We recommend signing up for the patient portal called "MyChart".  Sign up information is provided on this After Visit Summary.  MyChart is used to connect with patients for Virtual Visits (Telemedicine).  Patients are able to view lab/test results, encounter notes, upcoming appointments, etc.  Non-urgent messages can be sent to your provider as well.   To learn more about what you can do with MyChart, go to [email or web address].    Your next appointment:   12/30/19 with Dr. Rockey Situ as previously scheduled  If you have new or worsening shortness of breath or swelling, please call our office and we can schedule you a sooner appointment as needed.  Other Instructions   Weigh yourself daily and keep a log. Call our office if you gain 2 pounds overnight or 5 pounds in one week.   Drink less than 2 liters of fluid per day. If your mouth is dry, you can try a hard candy or peppermint as this often helps with dry mouth.    Continue the good work keeping your feet elevated when sitting, this will help reduce swelling.   Eat a low salt diet. Recommend avoiding processed and canned foods as these often have lots of salt.

## 2019-11-07 ENCOUNTER — Telehealth: Payer: Self-pay

## 2019-11-07 DIAGNOSIS — D631 Anemia in chronic kidney disease: Secondary | ICD-10-CM | POA: Diagnosis not present

## 2019-11-07 DIAGNOSIS — I5022 Chronic systolic (congestive) heart failure: Secondary | ICD-10-CM | POA: Diagnosis not present

## 2019-11-07 DIAGNOSIS — N1832 Chronic kidney disease, stage 3b: Secondary | ICD-10-CM | POA: Diagnosis not present

## 2019-11-07 DIAGNOSIS — N2581 Secondary hyperparathyroidism of renal origin: Secondary | ICD-10-CM | POA: Diagnosis not present

## 2019-11-07 NOTE — Telephone Encounter (Signed)
-----   Message from Tonia Ghent, MD sent at 11/03/2019 10:53 PM EDT ----- Please send copy of labs to Dr. Holley Raring with the renal clinic as FYI.  Please update patient.  White count is stable/normal.  Hemoglobin is slightly better than recent checks.  Potassium is still normal.  Creatinine is slightly worse but this was expected given his torsemide/spironolactone use.  His kidney function has been worse in the past.  I would continue as is and follow-up with the renal clinic.  Please let me know if his swelling/blistering is getting worse in the meantime.  I will await the notes from the renal clinic.  I want to recheck his A1c at a visit in 3 months.  If he is feeling worse in the meantime with more fluid retention then let me know.  Thanks.

## 2019-11-08 ENCOUNTER — Telehealth: Payer: Self-pay

## 2019-11-08 ENCOUNTER — Inpatient Hospital Stay: Payer: Medicare Other | Attending: Hospice and Palliative Medicine | Admitting: Hospice and Palliative Medicine

## 2019-11-08 DIAGNOSIS — C3490 Malignant neoplasm of unspecified part of unspecified bronchus or lung: Secondary | ICD-10-CM | POA: Insufficient documentation

## 2019-11-08 DIAGNOSIS — D631 Anemia in chronic kidney disease: Secondary | ICD-10-CM | POA: Insufficient documentation

## 2019-11-08 DIAGNOSIS — E1136 Type 2 diabetes mellitus with diabetic cataract: Secondary | ICD-10-CM | POA: Insufficient documentation

## 2019-11-08 DIAGNOSIS — N186 End stage renal disease: Secondary | ICD-10-CM | POA: Insufficient documentation

## 2019-11-08 DIAGNOSIS — I509 Heart failure, unspecified: Secondary | ICD-10-CM | POA: Insufficient documentation

## 2019-11-08 DIAGNOSIS — Z79899 Other long term (current) drug therapy: Secondary | ICD-10-CM | POA: Insufficient documentation

## 2019-11-08 DIAGNOSIS — M7989 Other specified soft tissue disorders: Secondary | ICD-10-CM | POA: Insufficient documentation

## 2019-11-08 DIAGNOSIS — I132 Hypertensive heart and chronic kidney disease with heart failure and with stage 5 chronic kidney disease, or end stage renal disease: Secondary | ICD-10-CM | POA: Insufficient documentation

## 2019-11-08 DIAGNOSIS — F17210 Nicotine dependence, cigarettes, uncomplicated: Secondary | ICD-10-CM | POA: Insufficient documentation

## 2019-11-08 DIAGNOSIS — J90 Pleural effusion, not elsewhere classified: Secondary | ICD-10-CM | POA: Insufficient documentation

## 2019-11-08 DIAGNOSIS — E1122 Type 2 diabetes mellitus with diabetic chronic kidney disease: Secondary | ICD-10-CM | POA: Insufficient documentation

## 2019-11-08 NOTE — Telephone Encounter (Signed)
-----   Message from Earlie Server, MD sent at 11/07/2019  4:43 PM EDT ----- Reschedule CT in 2 weeks, and follow up with me after that ----- Message ----- From: Vanice Sarah, CMA Sent: 11/07/2019   4:14 PM EDT To: Evelina Dun, RN, [place], #  October CT and MD were cx due to hospital admission.  Do you have plan for f/u?

## 2019-11-08 NOTE — Progress Notes (Signed)
Was unable to reach patient for scheduled MyChart visit.  I called and left him a voicemail.  It does not appear that he has current follow-up scheduled in the cancer center.  Message sent to Dr. Tasia Catchings and scheduling team regarding follow-up.  Will plan to see patient when he returns.

## 2019-11-08 NOTE — Telephone Encounter (Signed)
Please schedule for CT in 2 weeks with MD f/u a few days after.  Inform patient of appt details.

## 2019-11-08 NOTE — Telephone Encounter (Signed)
Done.. CT and MD F/U appts were scheduled as requested A detailed message was left on pts wife [name] VM making her aware of the location, dates and time of all sched appts. A reminder letter will be mailed out also

## 2019-11-11 ENCOUNTER — Ambulatory Visit
Admission: RE | Admit: 2019-11-11 | Discharge: 2019-11-11 | Disposition: A | Discharge status: Home / Self Care | Payer: Medicare Other | Source: Ambulatory Visit | Attending: Nephrology | Admitting: Nephrology

## 2019-11-11 ENCOUNTER — Other Ambulatory Visit: Payer: Self-pay

## 2019-11-11 DIAGNOSIS — R609 Edema, unspecified: Secondary | ICD-10-CM | POA: Diagnosis not present

## 2019-11-11 DIAGNOSIS — N189 Chronic kidney disease, unspecified: Secondary | ICD-10-CM | POA: Diagnosis not present

## 2019-11-11 LAB — COMPREHENSIVE METABOLIC PANEL
ALT: 24 U/L (ref 0–44)
AST: 45 U/L — ABNORMAL HIGH (ref 15–41)
Albumin: 2.8 g/dL — ABNORMAL LOW (ref 3.5–5.0)
Alkaline Phosphatase: 241 U/L — ABNORMAL HIGH (ref 38–126)
Anion gap: 8 (ref 5–15)
BUN: 45 mg/dL — ABNORMAL HIGH (ref 8–23)
CO2: 32 mmol/L (ref 22–32)
Calcium: 8.6 mg/dL — ABNORMAL LOW (ref 8.9–10.3)
Chloride: 99 mmol/L (ref 98–111)
Creatinine, Ser: 1.89 mg/dL — ABNORMAL HIGH (ref 0.61–1.24)
GFR, Estimated: 39 mL/min — ABNORMAL LOW (ref >60)
Glucose, Bld: 142 mg/dL — ABNORMAL HIGH (ref 70–99)
Potassium: 3.6 mmol/L (ref 3.5–5.1)
Sodium: 139 mmol/L (ref 135–145)
Total Bilirubin: 1.2 mg/dL (ref 0.3–1.2)
Total Protein: 7 g/dL (ref 6.5–8.1)

## 2019-11-11 MED ORDER — FUROSEMIDE 10 MG/ML IJ SOLN
80.0000 mg | Freq: Once | INTRAMUSCULAR | Status: AC
  Administered 2019-11-11: 80 mg via INTRAVENOUS

## 2019-11-11 MED ORDER — FUROSEMIDE 10 MG/ML IJ SOLN
INTRAMUSCULAR | Status: AC
  Filled 2019-11-11: qty 8

## 2019-11-13 ENCOUNTER — Ambulatory Visit
Admission: RE | Admit: 2019-11-13 | Discharge: 2019-11-13 | Disposition: A | Discharge status: Home / Self Care | Payer: Medicare Other | Source: Ambulatory Visit | Attending: Nephrology | Admitting: Nephrology

## 2019-11-13 ENCOUNTER — Other Ambulatory Visit: Payer: Self-pay

## 2019-11-13 DIAGNOSIS — R609 Edema, unspecified: Secondary | ICD-10-CM | POA: Insufficient documentation

## 2019-11-13 DIAGNOSIS — N189 Chronic kidney disease, unspecified: Secondary | ICD-10-CM | POA: Diagnosis not present

## 2019-11-13 MED ORDER — FUROSEMIDE 10 MG/ML IJ SOLN
INTRAMUSCULAR | Status: AC
  Administered 2019-11-13: 80 mg via INTRAVENOUS
  Filled 2019-11-13: qty 8

## 2019-11-13 MED ORDER — FUROSEMIDE 10 MG/ML IJ SOLN: 80.0000 mg | Freq: Once | INTRAMUSCULAR | Status: AC

## 2019-11-15 ENCOUNTER — Other Ambulatory Visit: Payer: Self-pay

## 2019-11-15 ENCOUNTER — Ambulatory Visit
Admission: RE | Admit: 2019-11-15 | Discharge: 2019-11-15 | Disposition: A | Discharge status: Home / Self Care | Payer: Medicare Other | Source: Ambulatory Visit | Attending: Nephrology | Admitting: Nephrology

## 2019-11-15 DIAGNOSIS — R609 Edema, unspecified: Secondary | ICD-10-CM | POA: Diagnosis not present

## 2019-11-15 DIAGNOSIS — N189 Chronic kidney disease, unspecified: Secondary | ICD-10-CM | POA: Diagnosis not present

## 2019-11-15 LAB — RENAL FUNCTION PANEL
Albumin: 2.6 g/dL — ABNORMAL LOW (ref 3.5–5.0)
Anion gap: 9 (ref 5–15)
BUN: 42 mg/dL — ABNORMAL HIGH (ref 8–23)
CO2: 27 mmol/L (ref 22–32)
Calcium: 8.4 mg/dL — ABNORMAL LOW (ref 8.9–10.3)
Chloride: 101 mmol/L (ref 98–111)
Creatinine, Ser: 1.73 mg/dL — ABNORMAL HIGH (ref 0.61–1.24)
GFR, Estimated: 44 mL/min — ABNORMAL LOW (ref >60)
Glucose, Bld: 102 mg/dL — ABNORMAL HIGH (ref 70–99)
Phosphorus: 3.6 mg/dL (ref 2.5–4.6)
Potassium: 3.9 mmol/L (ref 3.5–5.1)
Sodium: 137 mmol/L (ref 135–145)

## 2019-11-15 MED ORDER — FUROSEMIDE 10 MG/ML IJ SOLN: 80.0000 mg | Freq: Once | INTRAMUSCULAR | Status: AC

## 2019-11-15 MED ORDER — POTASSIUM CHLORIDE CRYS ER 20 MEQ PO TBCR: 40.0000 meq | EXTENDED_RELEASE_TABLET | Freq: Once | ORAL | Status: AC

## 2019-11-15 MED ORDER — POTASSIUM CHLORIDE CRYS ER 20 MEQ PO TBCR
EXTENDED_RELEASE_TABLET | ORAL | Status: AC
  Administered 2019-11-15: 40 meq via ORAL
  Filled 2019-11-15: qty 2

## 2019-11-15 MED ORDER — FUROSEMIDE 10 MG/ML IJ SOLN
INTRAMUSCULAR | Status: AC
  Administered 2019-11-15: 80 mg via INTRAVENOUS
  Filled 2019-11-15: qty 8

## 2019-11-15 NOTE — Discharge Instructions (Signed)
Furosemide (LasixT) What is this medication used for?  This medication is used to prevent excessive fluid in the lungs.  It can also be used to treat generalized swelling and high blood pressure.  How should this medication be given?  Jesse Fall well before measuring the dose.  . Measure the correct dose using an oral syringe. . Place the syringe in the infant's mouth and give small amounts, allowing time for them to swallow after each squirt.  . Should be given with food to avoid stomach upset. . Give at the same time every day to avoid changes in blood pressure.  What should be done if a dose is missed? If a dose is missed, give it as soon as you remember. If it is close to the time for the next dose, simply skip the missed dose and restart the regular dosing schedule. It is important NOT to give double the recommended dose.   Are there any side effects?  . Changes in electrolytes that may require your baby's doctor to check labs. . Low blood pressure, especially if given with other medications that reduce blood pressure. . May cause diarrhea, vomiting, and constipation.  Other important information: . Store at room temperature. . Do not stop this medication without calling your baby's doctor.

## 2019-11-18 ENCOUNTER — Other Ambulatory Visit: Payer: Self-pay

## 2019-11-18 ENCOUNTER — Ambulatory Visit
Admission: RE | Admit: 2019-11-18 | Discharge: 2019-11-18 | Disposition: A | Discharge status: Home / Self Care | Payer: Medicare Other | Source: Ambulatory Visit | Attending: Nephrology | Admitting: Nephrology

## 2019-11-18 DIAGNOSIS — R609 Edema, unspecified: Secondary | ICD-10-CM | POA: Diagnosis not present

## 2019-11-18 DIAGNOSIS — I5043 Acute on chronic combined systolic (congestive) and diastolic (congestive) heart failure: Secondary | ICD-10-CM | POA: Diagnosis not present

## 2019-11-18 DIAGNOSIS — N189 Chronic kidney disease, unspecified: Secondary | ICD-10-CM | POA: Insufficient documentation

## 2019-11-18 MED ORDER — POTASSIUM CHLORIDE CRYS ER 20 MEQ PO TBCR
EXTENDED_RELEASE_TABLET | ORAL | Status: AC
  Filled 2019-11-18: qty 2

## 2019-11-18 MED ORDER — FUROSEMIDE 10 MG/ML IJ SOLN
80.0000 mg | Freq: Once | INTRAMUSCULAR | Status: AC
  Administered 2019-11-18: 80 mg via INTRAVENOUS

## 2019-11-18 MED ORDER — FUROSEMIDE 10 MG/ML IJ SOLN
INTRAMUSCULAR | Status: AC
  Filled 2019-11-18: qty 8

## 2019-11-18 MED ORDER — POTASSIUM CHLORIDE CRYS ER 20 MEQ PO TBCR
40.0000 meq | EXTENDED_RELEASE_TABLET | Freq: Once | ORAL | Status: AC
  Administered 2019-11-18: 40 meq via ORAL

## 2019-11-20 ENCOUNTER — Ambulatory Visit
Admission: RE | Admit: 2019-11-20 | Discharge: 2019-11-20 | Disposition: A | Discharge status: Home / Self Care | Payer: Medicare Other | Source: Ambulatory Visit | Attending: Nephrology | Admitting: Nephrology

## 2019-11-20 ENCOUNTER — Other Ambulatory Visit: Payer: Self-pay

## 2019-11-20 DIAGNOSIS — R609 Edema, unspecified: Secondary | ICD-10-CM | POA: Diagnosis not present

## 2019-11-20 DIAGNOSIS — N189 Chronic kidney disease, unspecified: Secondary | ICD-10-CM | POA: Insufficient documentation

## 2019-11-20 MED ORDER — POTASSIUM CHLORIDE CRYS ER 20 MEQ PO TBCR
EXTENDED_RELEASE_TABLET | ORAL | Status: AC
  Administered 2019-11-20: 40 meq via ORAL
  Filled 2019-11-20: qty 2

## 2019-11-20 MED ORDER — FUROSEMIDE 10 MG/ML IJ SOLN: 80.0000 mg | Freq: Once | INTRAMUSCULAR | Status: AC

## 2019-11-20 MED ORDER — POTASSIUM CHLORIDE CRYS ER 20 MEQ PO TBCR: 40.0000 meq | EXTENDED_RELEASE_TABLET | Freq: Once | ORAL | Status: AC

## 2019-11-20 MED ORDER — FUROSEMIDE 10 MG/ML IJ SOLN
INTRAMUSCULAR | Status: AC
  Administered 2019-11-20: 80 mg via INTRAVENOUS
  Filled 2019-11-20: qty 8

## 2019-11-21 ENCOUNTER — Other Ambulatory Visit: Payer: Self-pay

## 2019-11-21 ENCOUNTER — Ambulatory Visit
Admission: RE | Admit: 2019-11-21 | Discharge: 2019-11-21 | Disposition: A | Discharge status: Home / Self Care | Payer: Medicare Other | Source: Ambulatory Visit | Attending: Oncology | Admitting: Oncology

## 2019-11-21 DIAGNOSIS — C3490 Malignant neoplasm of unspecified part of unspecified bronchus or lung: Secondary | ICD-10-CM

## 2019-11-22 ENCOUNTER — Ambulatory Visit
Admission: RE | Admit: 2019-11-22 | Discharge: 2019-11-22 | Disposition: A | Discharge status: Home / Self Care | Payer: Medicare Other | Source: Ambulatory Visit | Attending: Nephrology | Admitting: Nephrology

## 2019-11-22 ENCOUNTER — Other Ambulatory Visit: Payer: Self-pay

## 2019-11-22 DIAGNOSIS — R609 Edema, unspecified: Secondary | ICD-10-CM | POA: Insufficient documentation

## 2019-11-22 DIAGNOSIS — N189 Chronic kidney disease, unspecified: Secondary | ICD-10-CM | POA: Insufficient documentation

## 2019-11-22 LAB — RENAL FUNCTION PANEL
Albumin: 2.5 g/dL — ABNORMAL LOW (ref 3.5–5.0)
Anion gap: 12 (ref 5–15)
BUN: 51 mg/dL — ABNORMAL HIGH (ref 8–23)
CO2: 28 mmol/L (ref 22–32)
Calcium: 8.3 mg/dL — ABNORMAL LOW (ref 8.9–10.3)
Chloride: 97 mmol/L — ABNORMAL LOW (ref 98–111)
Creatinine, Ser: 1.91 mg/dL — ABNORMAL HIGH (ref 0.61–1.24)
GFR, Estimated: 39 mL/min — ABNORMAL LOW (ref >60)
Glucose, Bld: 225 mg/dL — ABNORMAL HIGH (ref 70–99)
Phosphorus: 4.4 mg/dL (ref 2.5–4.6)
Potassium: 3.7 mmol/L (ref 3.5–5.1)
Sodium: 137 mmol/L (ref 135–145)

## 2019-11-22 MED ORDER — FUROSEMIDE 10 MG/ML IJ SOLN
INTRAMUSCULAR | Status: AC
  Administered 2019-11-22: 80 mg via INTRAVENOUS
  Filled 2019-11-22: qty 8

## 2019-11-22 MED ORDER — POTASSIUM CHLORIDE CRYS ER 20 MEQ PO TBCR: 40.0000 meq | EXTENDED_RELEASE_TABLET | Freq: Once | ORAL | Status: AC

## 2019-11-22 MED ORDER — FUROSEMIDE 10 MG/ML IJ SOLN: 80.0000 mg | Freq: Once | INTRAMUSCULAR | Status: AC

## 2019-11-22 MED ORDER — POTASSIUM CHLORIDE CRYS ER 20 MEQ PO TBCR
EXTENDED_RELEASE_TABLET | ORAL | Status: AC
  Administered 2019-11-22: 40 meq via ORAL
  Filled 2019-11-22: qty 2

## 2019-11-25 ENCOUNTER — Other Ambulatory Visit
Admission: RE | Admit: 2019-11-25 | Discharge: 2019-11-25 | Disposition: A | Discharge status: Home / Self Care | Payer: Medicare Other | Source: Ambulatory Visit | Attending: Oncology | Admitting: Oncology

## 2019-11-25 ENCOUNTER — Inpatient Hospital Stay (HOSPITAL_BASED_OUTPATIENT_CLINIC_OR_DEPARTMENT_OTHER): Payer: Medicare Other | Admitting: Oncology

## 2019-11-25 ENCOUNTER — Other Ambulatory Visit: Payer: Self-pay | Admitting: Family Medicine

## 2019-11-25 ENCOUNTER — Encounter: Payer: Self-pay | Admitting: Oncology

## 2019-11-25 ENCOUNTER — Other Ambulatory Visit: Payer: Self-pay

## 2019-11-25 ENCOUNTER — Ambulatory Visit: Payer: Medicare Other | Admitting: Oncology

## 2019-11-25 ENCOUNTER — Inpatient Hospital Stay (HOSPITAL_BASED_OUTPATIENT_CLINIC_OR_DEPARTMENT_OTHER): Payer: Medicare Other | Admitting: Hospice and Palliative Medicine

## 2019-11-25 ENCOUNTER — Encounter: Payer: Medicare Other | Admitting: Hospice and Palliative Medicine

## 2019-11-25 VITALS — BP 115/62 | HR 88 | Temp 96.0°F | Resp 16 | Wt 198.4 lb

## 2019-11-25 DIAGNOSIS — J90 Pleural effusion, not elsewhere classified: Secondary | ICD-10-CM | POA: Diagnosis not present

## 2019-11-25 DIAGNOSIS — C3490 Malignant neoplasm of unspecified part of unspecified bronchus or lung: Secondary | ICD-10-CM | POA: Diagnosis not present

## 2019-11-25 DIAGNOSIS — Z20822 Contact with and (suspected) exposure to covid-19: Secondary | ICD-10-CM | POA: Diagnosis not present

## 2019-11-25 DIAGNOSIS — I5022 Chronic systolic (congestive) heart failure: Secondary | ICD-10-CM | POA: Diagnosis not present

## 2019-11-25 DIAGNOSIS — D631 Anemia in chronic kidney disease: Secondary | ICD-10-CM | POA: Diagnosis not present

## 2019-11-25 DIAGNOSIS — M7989 Other specified soft tissue disorders: Secondary | ICD-10-CM | POA: Diagnosis not present

## 2019-11-25 DIAGNOSIS — Z79899 Other long term (current) drug therapy: Secondary | ICD-10-CM | POA: Diagnosis not present

## 2019-11-25 DIAGNOSIS — Z515 Encounter for palliative care: Secondary | ICD-10-CM

## 2019-11-25 DIAGNOSIS — Z01812 Encounter for preprocedural laboratory examination: Secondary | ICD-10-CM | POA: Insufficient documentation

## 2019-11-25 DIAGNOSIS — I132 Hypertensive heart and chronic kidney disease with heart failure and with stage 5 chronic kidney disease, or end stage renal disease: Secondary | ICD-10-CM | POA: Diagnosis not present

## 2019-11-25 DIAGNOSIS — N1832 Chronic kidney disease, stage 3b: Secondary | ICD-10-CM | POA: Diagnosis not present

## 2019-11-25 DIAGNOSIS — N2581 Secondary hyperparathyroidism of renal origin: Secondary | ICD-10-CM | POA: Diagnosis not present

## 2019-11-25 DIAGNOSIS — E1136 Type 2 diabetes mellitus with diabetic cataract: Secondary | ICD-10-CM | POA: Diagnosis not present

## 2019-11-25 DIAGNOSIS — E1122 Type 2 diabetes mellitus with diabetic chronic kidney disease: Secondary | ICD-10-CM | POA: Diagnosis not present

## 2019-11-25 DIAGNOSIS — I509 Heart failure, unspecified: Secondary | ICD-10-CM | POA: Diagnosis not present

## 2019-11-25 DIAGNOSIS — F17210 Nicotine dependence, cigarettes, uncomplicated: Secondary | ICD-10-CM | POA: Diagnosis not present

## 2019-11-25 DIAGNOSIS — N186 End stage renal disease: Secondary | ICD-10-CM | POA: Diagnosis not present

## 2019-11-25 LAB — SARS CORONAVIRUS 2 (TAT 6-24 HRS): SARS Coronavirus 2: NEGATIVE

## 2019-11-25 NOTE — Telephone Encounter (Signed)
Pharmacy requests refill on: Potassium CL ER 10 mEq  LAST REFILL: 11/01/2019 LAST OV: 11/01/2019 NEXT OV: Not Scheduled  PHARMACY: [facility] [place], [place]

## 2019-11-25 NOTE — Progress Notes (Signed)
Hematology/Oncologyfollow up  note [facility] Telephone:([phone]) [phone] [phone]   Patient Care Team: Tonia Ghent, MD as PCP - General (Family Medicine) Thelma Comp, [place] (Optometry) Telford Nab, RN as Registered Nurse  REFERRING PROVIDER: Tonia Ghent, MD  CHIEF COMPLAINTS/REASON FOR VISIT:  Follow-up for metastatic lung adenocarcinoma  HISTORY OF PRESENTING ILLNESS:   Craig Koch is a  64 y.o.  male with PMH listed below was seen in consultation at the request of  Tonia Ghent, MD  for evaluation of malignant pleural effusion. Patient was recently admitted to [facility] due to large amount of pleural effusion, acute hypoxic respiratory failure. Patient reports feeling shortness of breath and dyspnea on exertion for several weeks and presented to emergency room.  A chest x-ray 08/23/2018 showed a large amount of left pleural effusion 08/24/2018 CT chest abdomen pelvis confirmed large left pleural effusion with near complete collapse of the left lower lobe and extensive volume loss in the lingula and a portion of the left upper lobe. No acute intra-abdominal process.  Cholelithiasis, diverticulitis, coronary atherosclerosis post CABG.  Bilateral renal cortical scarring.  Aortic atherosclerosis.  #08/24/2018 ultrasound guided diagnostic and therapeutic thoracentesis removed 1.8 L of pleural fluid. Patient subsequently felt better and was discharged to follow-up with pulmonology. 09/04/2018 patient followed up with Dr.Aleskerov chest x-ray showed recurrent moderate left pleural effusion.  Patient underwent ultrasound-guided left thoracentesis again and it drained 3 L of fluid. Pleural fluid cytology positive for adenocarcinoma. Patient was referred to me for further evaluation and management.  # PET eft sided pleural hypermetabolic activity  consistent with malignant pleural effusion. Left upper and lower lobe low-level hypermetabolic him  corresponding to areas of airspace and groundglass opacity.  This was felt to be secondary to atelectasis.  Underlying left upper lobe primary bronchogenic carcinoma cannot be excluded. Medial left upper lobe area more focal hypermetabolic area could represent a pleural disease or a site of small left upper lobe primary. No evidence of extra thoracic hypermetabolic metastatic disease.  Left sided hydro-pneumothorax within the pleural air component being new since the prior CT. Cholelithiasis.  #MRI brain showed punctate focus of enhancement within the right parietal lobe cortex suspicious for possible metastasis. Molecular study showed BRAF V600 mutation. 11/16/2018 started on dabrafenib 150 mg twice daily and trametinib 2 mg daily.  He had good response  #Patient was admitted from 03/03/2019-03/19/2019 due to DKA due to uncontrolled diabetes, acute decompensation of CHF left lower lobe pneumonia right pleural effusion status post thoracentesis.  Fluid study was consistent with transudate. AKI secondary to ATN, on hemodialysis Tuesday Thursday and Saturday, atrial fibrillation with RVR, paroxysmal.  Patient was discharged on Eliquis and amiodarone. Patient was readmitted from 03/26/2019-04/01/2019 due to CHF exacerbation, right pleural effusion recurrent.  Status post diagnostic and therapeutic thoracentesis.  Cytology was negative for malignant cells.  #  Patient declined [id number] vaccination, declined flu shot.  INTERVAL HISTORY Craig Koch is a 64 y.o. male who has above history reviewed by me today presents for follow-up of lung cancer Problems and complaints are listed below:  Patient was accompanied by his wife today. He follows up with cardiology and nephrology. Lower extremity edema, is on Lasix every other day. Patient has chronic intermittent cough, clear phlegm.  No fever chills or shortness of breath.  Patient has had a surveillance CT scan done and presents to discuss the results  and management plan. Appetite is fair.  Weight is stable.  Review of Systems  Constitutional: Positive for fatigue. Negative for appetite change, chills, diaphoresis and fever.  HENT:   Negative for hearing loss, lump/mass, nosebleeds, sore throat and voice change.   Eyes: Negative for eye problems and icterus.  Respiratory: Positive for shortness of breath. Negative for chest tightness, cough, hemoptysis and wheezing.   Cardiovascular: Positive for leg swelling. Negative for chest pain.  Gastrointestinal: Negative for abdominal distention, abdominal pain, blood in stool, diarrhea, nausea and rectal pain.  Endocrine: Negative for hot flashes.  Genitourinary: Negative for bladder incontinence, difficulty urinating, dysuria, frequency, hematuria and nocturia.   Musculoskeletal: Negative for arthralgias, back pain, flank pain, gait problem and myalgias.  Skin: Negative for itching and rash.  Neurological: Negative for dizziness, gait problem, headaches, light-headedness, numbness and seizures.  Hematological: Negative for adenopathy. Does not bruise/bleed easily.  Psychiatric/Behavioral: Negative for confusion and decreased concentration. The patient is not nervous/anxious.     MEDICAL HISTORY:  Past Medical History:  Diagnosis Date  . Anxiety   . Arthritis   . CHF (congestive heart failure) ([place])   . Coronary artery disease   . Diabetes mellitus   . Dyslipidemia   . Hx of CABG   . Hyperlipidemia   . Hypertension   . Malignant neoplasm of unspecified part of unspecified bronchus or lung ([place]) 08/2018   Immunotherapy  . Pleural effusion     SURGICAL HISTORY: Past Surgical History:  Procedure Laterality Date  . CATARACT EXTRACTION Left 09/2015  . CHEST TUBE INSERTION Left 10/01/2018   Procedure: INSERTION PLEURAL DRAINAGE CATHETER;  Surgeon: [name], MD;  Location: [facility] ORS;  Service: Thoracic;  Laterality: Left;  . CORONARY ARTERY BYPASS GRAFT  2004   (CABG with LIMA to  the  LAD, SVG to OM2/OM3, SVG  to diag  . DIALYSIS/PERMA CATHETER INSERTION N/A 03/14/2019   Procedure: DIALYSIS/PERMA CATHETER INSERTION;  Surgeon: Algernon Huxley, MD;  Location: [place] [facility];  Service: Cardiovascular;  Laterality: N/A;  . DIALYSIS/PERMA CATHETER REMOVAL N/A 05/28/2019   Procedure: DIALYSIS/PERMA CATHETER REMOVAL;  Surgeon: Katha Cabal, MD;  Location: [place] [facility];  Service: Cardiovascular;  Laterality: N/A;  . Left ankle surgery     repair of fracture  . Right lower leg surgery     rod  . TEMPORARY DIALYSIS CATHETER N/A 03/11/2019   Procedure: TEMPORARY DIALYSIS CATHETER;  Surgeon: Algernon Huxley, MD;  Location: [place] [facility];  Service: Cardiovascular;  Laterality: N/A;   SOCIAL HISTORY: Social History   Socioeconomic History  . Marital status: Married    Spouse name: Not on file  . Number of children: Not on file  . Years of education: Not on file  . Highest education level: Not on file  Occupational History  . Not on file  Tobacco Use  . Smoking status: Current Every Day Smoker    Packs/day: 0.20    Years: 45.00    Pack years: 9.00    Types: Cigarettes    Last attempt to quit: 03/05/2019    Years since quitting: 0.7  . Smokeless tobacco: Former Systems developer    Types: Snuff  Vaping Use  . Vaping Use: Never used  Substance and Sexual Activity  . Alcohol use: Not Currently    Alcohol/week: 6.0 standard drinks    Types: 6 Cans of beer per week    Comment: occ, average 6 pack in a week  . Drug use: No  . Sexual activity: Not Currently  Other Topics Concern  . Not on  file  Social History Narrative   On disability 2009 after prev injuries and CAD.     Married 1976   2 kids, 4 grandkids.    Social Determinants of Health   Financial Resource Strain:   . Difficulty of Paying Living Expenses: Not on file  Food Insecurity:   . Worried About Charity fundraiser in the Last Year: Not on file  . Ran Out of Food in the Last Year: Not on file    Transportation Needs:   . Lack of Transportation (Medical): Not on file  . Lack of Transportation (Non-Medical): Not on file  Physical Activity:   . Days of Exercise per Week: Not on file  . Minutes of Exercise per Session: Not on file  Stress:   . Feeling of Stress : Not on file  Social Connections:   . Frequency of Communication with Friends and Family: Not on file  . Frequency of Social Gatherings with Friends and Family: Not on file  . Attends Religious Services: Not on file  . Active Member of Clubs or Organizations: Not on file  . Attends Archivist Meetings: Not on file  . Marital Status: Not on file  Intimate Partner Violence:   . Fear of Current or Ex-Partner: Not on file  . Emotionally Abused: Not on file  . Physically Abused: Not on file  . Sexually Abused: Not on file     FAMILY HISTORY: Family History  Problem Relation Age of Onset  . Dementia Mother   . Heart disease Father   . Colon cancer Neg Hx   . Prostate cancer Neg Hx   . Diabetes Neg Hx    ALLERGIES:  Allergies  Allergen Reactions  . Promethazine Other (See Comments)    QT prolongation.   . Trazodone And Nefazodone Other (See Comments)    QT prolongation.   . Lipitor [Atorvastatin Calcium] Other (See Comments)    Aches.  Tolerated crestor.      MEDICATIONS: PHYSICAL EXAMINATION: Current Outpatient Medications on File Prior to Visit  Medication Sig Dispense Refill  . albuterol (VENTOLIN HFA) 108 (90 Base) MCG/ACT inhaler Inhale 2 puffs into the lungs every 6 (six) hours as needed for wheezing or shortness of breath.    [name] amiodarone (PACERONE) 200 MG tablet Take 1 tablet (200 mg total) by mouth daily. 90 tablet 3  . apixaban (ELIQUIS) 5 MG TABS tablet Take 1 tablet (5 mg total) by mouth 2 (two) times daily. 60 tablet 5  . DULoxetine (CYMBALTA) 30 MG capsule TAKE 1 CAPSULE BY MOUTH EVERY DAY 90 capsule 1  . ezetimibe (ZETIA) 10 MG tablet Take 1 tablet (10 mg total) by mouth daily. 30  tablet 5  . ferrous sulfate 325 (65 FE) MG tablet TAKE 1 TABLET (325 MG TOTAL) BY MOUTH 2 (TWO) TIMES DAILY WITH A MEAL. 299 tablet 0  . folic acid (FOLVITE) 371 MCG tablet Take 400 mcg by mouth daily.    . insulin detemir (LEVEMIR) 100 UNIT/ML injection Inject 0.04-0.05 mLs (4-5 Units total) into the skin at bedtime. if glucose over 190 mg/dl    . insulin lispro (HUMALOG) 100 UNIT/ML injection Inject 10-15 Units into the skin 3 (three) times daily. Patient takes 10 unit TID with meals plus additional units if needed based on glucose 20 mL 2  . ipratropium-albuterol (DUONEB) 0.5-2.5 (3) MG/3ML SOLN Take 3 mLs by nebulization 4 (four) times daily.    . metoprolol succinate (TOPROL-XL) 25 MG 24 hr  tablet TAKE 1/2 TABLET BY MOUTH EVERY DAY 45 tablet 1  . multivitamin (RENA-VIT) TABS tablet Take 1 tablet by mouth at bedtime.  0  . promethazine (PHENERGAN) 25 MG tablet Take 25 mg by mouth every 6 (six) hours as needed for nausea or vomiting.    . rosuvastatin (CRESTOR) 10 MG tablet TAKE 1 TABLET (10 MG TOTAL) BY MOUTH DAILY. 90 tablet 3  . spironolactone (ALDACTONE) 25 MG tablet Take 1 tablet (25 mg total) by mouth daily. 30 tablet 1  . torsemide (DEMADEX) 20 MG tablet Take 4 tablets (80 mg total) by mouth daily. 120 tablet 1   No current facility-administered medications on file prior to visit.    ECOG PERFORMANCE STATUS:3  Today's Vitals   11/25/19 1021 11/25/19 1027  BP: 115/62   Pulse: 88   Resp: 16   Temp: (!) 96 F (35.6 C)   TempSrc: Tympanic   SpO2: 94%   Weight: 198 lb 6.4 oz (90 kg)   PainSc:  0-No pain   Body mass index is 26.18 kg/m.  Physical Exam Constitutional:      General: He is not in acute distress.    Comments: Patient sits in the wheelchair.  HENT:     Head: Normocephalic and atraumatic.  Eyes:     General: No scleral icterus.    Pupils: Pupils are equal, round, and reactive to light.  Cardiovascular:     Rate and Rhythm: Normal rate and regular rhythm.      Heart sounds: Normal heart sounds.  Pulmonary:     Effort: Pulmonary effort is normal. No respiratory distress.     Breath sounds: No wheezing.     Comments: Bilateral Rales at the base. Decreased breath sound at the left compared to the right. Abdominal:     General: Bowel sounds are normal. There is no distension.     Palpations: Abdomen is soft. There is no mass.     Tenderness: There is no abdominal tenderness.  Musculoskeletal:        General: Swelling present. No deformity. Normal range of motion.     Cervical back: Normal range of motion and neck supple.  Skin:    General: Skin is warm and dry.     Findings: No erythema or rash.  Neurological:     Mental Status: He is alert. Mental status is at baseline.     Cranial Nerves: No cranial nerve deficit.     Coordination: Coordination normal.  Psychiatric:        Mood and Affect: Mood normal.     LABORATORY DATA:  I have reviewed the data as listed Lab Results  Component Value Date   WBC 18.9 (H) 11/09/2018   HGB 11.0 (L) 11/09/2018   HCT 33.5 (L) 11/09/2018   MCV 89.8 11/09/2018   PLT 581 (H) 11/09/2018   Recent Labs    10/19/18 0841 11/02/18 1238 11/09/18 0833  NA 137 132* 131*  K 4.0 4.0 3.5  CL 104 95* 94*  CO2 _0 GLUCOSE 252* 425* 361*  BUN _1 CREATININE 0.61 0.84 0.77  CALCIUM 8.5* 8.5* 8.0*  GFRNONAA >60 >60 >60  GFRAA >60 >60 >60  PROT 6.0* 7.0 6.5  ALBUMIN 3.0* 2.6* 2.3*  AST 14* 11* 12*  ALT _2 ALKPHOS 69 75 63  BILITOT 0.5 0.9 0.8   Iron/TIBC/Ferritin/ %Sat No results found for: IRON, TIBC, FERRITIN, IRONPCTSAT    RADIOGRAPHIC STUDIES: I  have personally reviewed the radiological images as listed and agreed with the findings in the report. CT Abdomen Pelvis Wo Contrast  Result Date: 11/22/2019 CLINICAL DATA:  Lung cancer follow-up, reportedly LEFT lung cancer with history of shortness of breath EXAM: CT CHEST, ABDOMEN AND PELVIS WITHOUT CONTRAST TECHNIQUE: Multidetector  CT imaging of the chest, abdomen and pelvis was performed following the standard protocol without IV contrast. COMPARISON:  CT of the chest from January 22, 2019 and PET exam from July 18, 2019 FINDINGS: CT CHEST FINDINGS Cardiovascular: Calcified atheromatous plaque of the thoracic aorta. Signs of median sternotomy for coronary revascularization. Heart size top-normal. No significant pericardial effusion. Extensive coronary artery calcifications and signs of previous coronary revascularization. Central pulmonary vasculature mildly engorged. Limited assessment of cardiovascular structures given lack of intravenous contrast. Mediastinum/Nodes: Thoracic inlet structures are normal. No axillary lymphadenopathy. No mediastinal lymphadenopathy. Mild increase in size of mediastinal lymph nodes however since the prior study largest approximately 9 mm. No gross hilar adenopathy, limited assessment due to lack of intravenous contrast. Lungs/Pleura: Septal thickening and ground-glass that was seen on the previous study, PET scan of July 18, 2019 is markedly improved in the RIGHT chest. Still with some septal thickening and ground-glass in the LEFT chest. This is particularly true of the LEFT lung base with there is further consolidative change of LEFT lower lobe. Discrete nodules are however seen on today's evaluation (image 110, series 3) 10 mm RIGHT lower lobe nodule in aerated RIGHT lower lobe. At least 7 additional small nodules, smaller size than the above nodule or present the RIGHT lower lobe. RIGHT upper lobe nodule along the major fissure (image 51, series 3) 5 mm. Moderately large RIGHT pleural effusion slightly increased in size now with sub pulmonic component compared to the previous study. Chronic appearing lenticular LEFT-sided effusion tracking into the lateral LEFT costo diaphragmatic sulcus is stable. Signs of pleural thickening about the LEFT chest are similar Musculoskeletal: See below for full  musculoskeletal detail. CT ABDOMEN PELVIS FINDINGS Hepatobiliary: Cholelithiasis. No acute process related to the gallbladder. No focal lesion seen on noncontrast imaging with similar contour of the liver compared to the prior study. Pancreas: Pancreatic atrophy without signs of inflammation, ductal dilation or lesion. Spleen: Spleen normal in size and contour. Adrenals/Urinary Tract: Adrenal glands are normal Bilateral renal cortical scarring. No hydronephrosis. Urinary bladder is under distended. Renal vascular calcifications are similar to the prior study. Stomach/Bowel: Signs of mesenteric edema throughout the abdomen seen in the setting of ascites and body wall edema. No acute gastrointestinal process. Vascular/Lymphatic: Calcified atheromatous plaque in the abdominal aorta which is nonaneurysmal. There is no gastrohepatic or hepatoduodenal ligament lymphadenopathy. No retroperitoneal or mesenteric lymphadenopathy. No pelvic lymphadenopathy Reproductive: Prostate unremarkable by CT. Other: Extensive body wall edema. Moderate volume ascites mainly about the liver and RIGHT hemiabdomen. Smaller volume in the LEFT hemiabdomen. Ascites does track into the pelvis. Musculoskeletal: Osteopenia with spinal degenerative changes. No acute or destructive bone process. IMPRESSION: 1. Moderately large RIGHT pleural effusion slightly increased in size now with sub pulmonic component compared to the previous study. Also associated with new pulmonary nodules, suspicious for worsening of disease and developing malignant effusion in the RIGHT chest. 2. Improved appearance of presumed pneumonitis throughout the RIGHT chest with some worsening of septal thickening and consolidative changes in the LEFT lung base. 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 Superimposed infection at the LEFT lung base in particular is not excluded. 3. Stable loculated, chronic appearing LEFT-sided effusion. 4. Mild increase in size  of mediastinal  lymph nodes however since the prior study, largest approximately 9 mm. These are nonspecific and could be reactive, but are larger compared to prior studies, attention on follow-up. 5. Anasarca. 6. Cholelithiasis, aortic atherosclerosis and other incidental findings as outlined above. Aortic Atherosclerosis (ICD10-I70.0). Electronically Signed   By: Zetta Bills M.D.   On: 11/22/2019 12:41   DG Chest 2 View  Result Date: 10/21/2019 CLINICAL DATA:  Bilateral lower extremity swelling and weight gain. EXAM: CHEST - 2 VIEW COMPARISON:  July 29, 2019 FINDINGS: Multiple sternal wires and vascular clips are present. Mild, diffusely increased interstitial lung markings are noted. Mild to moderate severity infiltrates are seen within the mid and lower left lung. This is mildly increased in severity when compared to the prior study. The infiltrates seen within the right lung on the prior study are markedly decreased in severity when compared to the prior exam, with a very small amount of residual right basilar atelectasis and/or infiltrate seen. Small, stable bilateral pleural effusions are present. No pneumothorax is identified. The cardiac silhouette is mildly enlarged. The visualized skeletal structures are unremarkable. IMPRESSION: 1. Mild to moderate severity left lower lobe infiltrate, mildly increased in severity when compared to the prior study. 2. Very mild residual right basilar atelectasis and/or infiltrate. 3. Small, stable bilateral pleural effusions. Electronically Signed   By: Virgina Norfolk M.D.   On: 10/21/2019 21:11   DG Shoulder Right  Result Date: 10/21/2019 CLINICAL DATA:  Recent fall with shoulder pain, initial encounter EXAM: RIGHT SHOULDER - 2+ VIEW COMPARISON:  None. FINDINGS: Degenerative changes of the acromioclavicular joint are seen. No acute fracture or dislocation is noted. No soft tissue abnormality is seen. Ribcage is within normal limits. IMPRESSION: Degenerative change without  acute abnormality. Electronically Signed   By: Inez Catalina M.D.   On: 10/21/2019 22:45   CT Head Wo Contrast  Result Date: 10/21/2019 CLINICAL DATA:  Fall.  Head injury.  History of lung cancer. EXAM: CT HEAD WITHOUT CONTRAST CT CERVICAL SPINE WITHOUT CONTRAST TECHNIQUE: Multidetector CT imaging of the head and cervical spine was performed following the standard protocol without intravenous contrast. Multiplanar CT image reconstructions of the cervical spine were also generated. COMPARISON:  CT head 03/05/2019 FINDINGS: CT HEAD FINDINGS Brain: Generalized atrophy unchanged. Mild white matter hypodensity bilaterally unchanged. Negative for acute infarct, hemorrhage, mass. Vascular: Negative for hyperdense vessel Skull: Negative Sinuses/Orbits: Paranasal sinuses clear.  Left cataract extraction. Other: None CT CERVICAL SPINE FINDINGS Alignment: 3 mm anterolisthesis C3-4 and C4-5 Skull base and vertebrae: Negative for fracture Soft tissues and spinal canal: Negative for mass or adenopathy in the neck. Atherosclerotic calcification carotid bifurcation bilaterally. Disc levels: Multilevel disc and facet degeneration throughout the cervical spine. Multilevel spinal stenosis throughout the cervical spine most prominent C5-6 and C6-7. Upper chest: Moderately large right pleural effusion and small left pleural effusion. Apical emphysema. Other: None IMPRESSION: 1. No acute intracranial abnormality 2. Negative for cervical spine fracture.  Cervical spondylosis 3. Moderately large right pleural effusion and mild left pleural effusion. Electronically Signed   By: Franchot Gallo M.D.   On: 10/21/2019 17:59   CT Chest Wo Contrast  Result Date: 11/22/2019 CLINICAL DATA:  Lung cancer follow-up, reportedly LEFT lung cancer with history of shortness of breath EXAM: CT CHEST, ABDOMEN AND PELVIS WITHOUT CONTRAST TECHNIQUE: Multidetector CT imaging of the chest, abdomen and pelvis was performed following the standard protocol  without IV contrast. COMPARISON:  CT of the chest from January 22, 2019  and PET exam from July 18, 2019 FINDINGS: CT CHEST FINDINGS Cardiovascular: Calcified atheromatous plaque of the thoracic aorta. Signs of median sternotomy for coronary revascularization. Heart size top-normal. No significant pericardial effusion. Extensive coronary artery calcifications and signs of previous coronary revascularization. Central pulmonary vasculature mildly engorged. Limited assessment of cardiovascular structures given lack of intravenous contrast. Mediastinum/Nodes: Thoracic inlet structures are normal. No axillary lymphadenopathy. No mediastinal lymphadenopathy. Mild increase in size of mediastinal lymph nodes however since the prior study largest approximately 9 mm. No gross hilar adenopathy, limited assessment due to lack of intravenous contrast. Lungs/Pleura: Septal thickening and ground-glass that was seen on the previous study, PET scan of July 18, 2019 is markedly improved in the RIGHT chest. Still with some septal thickening and ground-glass in the LEFT chest. This is particularly true of the LEFT lung base with there is further consolidative change of LEFT lower lobe. Discrete nodules are however seen on today's evaluation (image 110, series 3) 10 mm RIGHT lower lobe nodule in aerated RIGHT lower lobe. At least 7 additional small nodules, smaller size than the above nodule or present the RIGHT lower lobe. RIGHT upper lobe nodule along the major fissure (image 51, series 3) 5 mm. Moderately large RIGHT pleural effusion slightly increased in size now with sub pulmonic component compared to the previous study. Chronic appearing lenticular LEFT-sided effusion tracking into the lateral LEFT costo diaphragmatic sulcus is stable. Signs of pleural thickening about the LEFT chest are similar Musculoskeletal: See below for full musculoskeletal detail. CT ABDOMEN PELVIS FINDINGS Hepatobiliary: Cholelithiasis. No acute process  related to the gallbladder. No focal lesion seen on noncontrast imaging with similar contour of the liver compared to the prior study. Pancreas: Pancreatic atrophy without signs of inflammation, ductal dilation or lesion. Spleen: Spleen normal in size and contour. Adrenals/Urinary Tract: Adrenal glands are normal Bilateral renal cortical scarring. No hydronephrosis. Urinary bladder is under distended. Renal vascular calcifications are similar to the prior study. Stomach/Bowel: Signs of mesenteric edema throughout the abdomen seen in the setting of ascites and body wall edema. No acute gastrointestinal process. Vascular/Lymphatic: Calcified atheromatous plaque in the abdominal aorta which is nonaneurysmal. There is no gastrohepatic or hepatoduodenal ligament lymphadenopathy. No retroperitoneal or mesenteric lymphadenopathy. No pelvic lymphadenopathy Reproductive: Prostate unremarkable by CT. Other: Extensive body wall edema. Moderate volume ascites mainly about the liver and RIGHT hemiabdomen. Smaller volume in the LEFT hemiabdomen. Ascites does track into the pelvis. Musculoskeletal: Osteopenia with spinal degenerative changes. No acute or destructive bone process. IMPRESSION: 1. Moderately large RIGHT pleural effusion slightly increased in size now with sub pulmonic component compared to the previous study. Also associated with new pulmonary nodules, suspicious for worsening of disease and developing malignant effusion in the RIGHT chest. 2. Improved appearance of presumed pneumonitis throughout the RIGHT chest with some worsening of septal thickening and consolidative changes in the LEFT lung base. 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 Superimposed infection at the LEFT lung base in particular is not excluded. 3. Stable loculated, chronic appearing LEFT-sided effusion. 4. Mild increase in size of mediastinal lymph nodes however since the prior study, largest approximately 9 mm. These are nonspecific and could  be reactive, but are larger compared to prior studies, attention on follow-up. 5. Anasarca. 6. Cholelithiasis, aortic atherosclerosis and other incidental findings as outlined above. Aortic Atherosclerosis (ICD10-I70.0). Electronically Signed   By: Zetta Bills M.D.   On: 11/22/2019 12:41   CT Cervical Spine Wo Contrast  Result Date: 10/21/2019 CLINICAL DATA:  Fall.  Head injury.  History of lung cancer. EXAM: CT HEAD WITHOUT CONTRAST CT CERVICAL SPINE WITHOUT CONTRAST TECHNIQUE: Multidetector CT imaging of the head and cervical spine was performed following the standard protocol without intravenous contrast. Multiplanar CT image reconstructions of the cervical spine were also generated. COMPARISON:  CT head 03/05/2019 FINDINGS: CT HEAD FINDINGS Brain: Generalized atrophy unchanged. Mild white matter hypodensity bilaterally unchanged. Negative for acute infarct, hemorrhage, mass. Vascular: Negative for hyperdense vessel Skull: Negative Sinuses/Orbits: Paranasal sinuses clear.  Left cataract extraction. Other: None CT CERVICAL SPINE FINDINGS Alignment: 3 mm anterolisthesis C3-4 and C4-5 Skull base and vertebrae: Negative for fracture Soft tissues and spinal canal: Negative for mass or adenopathy in the neck. Atherosclerotic calcification carotid bifurcation bilaterally. Disc levels: Multilevel disc and facet degeneration throughout the cervical spine. Multilevel spinal stenosis throughout the cervical spine most prominent C5-6 and C6-7. Upper chest: Moderately large right pleural effusion and small left pleural effusion. Apical emphysema. Other: None IMPRESSION: 1. No acute intracranial abnormality 2. Negative for cervical spine fracture.  Cervical spondylosis 3. Moderately large right pleural effusion and mild left pleural effusion. Electronically Signed   By: Franchot Gallo M.D.   On: 10/21/2019 17:59     ASSESSMENT & PLAN:  1. Malignant neoplasm of unspecified part of unspecified bronchus or lung ([place])    2. Stage 3b chronic kidney disease ([place])   3. Pleural effusion   4. Leg swelling   5. 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 BRAF targeted therapy was discontinued due to patient's recent acute illness including acute CHF, DKA, renal failure on hemodialysis.  Off hemodialysis. 11/21/2019, CT chest abdomen pelvis showed moderately large right pleural effusion increased size now with subpulmonic component compared to previous study.  Also associated with new pulmonary nodules. Improved appearance of presumed pneumonitis throughout the right chest with some worsening of septal thickening and consolidative changes in the left lung base Worsening of disease/lymphangitic carcinomatosis is possible.  Superimposed infection in the left lung base is not excluded.  Stable loculated chronic appearing left-sided effusion Mild increase in size of mediastinal lymph node.  Anasarca cholelithiasis.  -Right Pleural Effusion, Previously which was negative for malignancy.  His original lung cancer was at the left side. I recommend to proceed with therapeutic and diagnostic right thoracentesis.  We will Check cytology, cell count, protein, LDH. If cytology is positive-will be confirming disease recurrence. If cytology is negative, I will obtain PET scan for further evaluation.  Patient currently has borderline performance status due to multiple other medical conditions. obtain clearance from cardiology for restarting BRAF targeted therapy  (dabrafenib and trametinib) which may have underlying cardiac toxicities.  # Anemia of CKD. Hb is >10. Monitor.  #CHF continue follow-up with cardiology.   #Chronic kidney disease, stage IIIb.  He follows up with nephrology.  Kidney function appears to be stable recently. #Lower extremity edema, continue Lasix 40 mg every other  day. #Low albumin, anasarca.  Encourage protein rich diet.  Patient declines nutritionist referral previously.   Continue follow-up palliative care  Follow-up TBD   Earlie Server, MD, PhD Hematology Oncology [place] at [facility] 11/25/2019

## 2019-11-25 NOTE — Progress Notes (Signed)
Patient here for follow up.Pt reporting occasional sharp pain across lower abdomen. He has this pain the most when he urinates.

## 2019-11-25 NOTE — Progress Notes (Signed)
[place]  [phone] Fax:([phone]   Name: Craig Koch Date: 11/25/2019 MRN: [id number]  DOB: [birth year]  Patient Care Team: Tonia Ghent, MD as PCP - General (Family Medicine) Rockey Situ, Kathlene November, MD as PCP - Cardiology (Cardiology) Thelma Comp, [place] (Optometry) Telford Nab, RN as Registered Nurse Anthonette Legato, MD (Nephrology) Burnice Logan, [facility] as Pharmacist (Pharmacist)    REASON FOR CONSULTATION: Craig Koch is a 64 y.o. male with multiple medical problems including CAD with h/o NSTEMI, cardiomyopathy with EF of 30 to 35%, paroxysmal A. fib, DM with h/o DKA, CKD previously on dialysis, COPD, and stage IV adenocarcinoma of the lung with history of left sided malignant pleural effusion requiring Pleurx(later removed). Patient opted not to pursue chemotherapy and has instead been treated on immunotherapy. Patient has had persistent shortness of breath. Patient was hospitalized10/18/2021 -10/28/2019 with CHF. Patient was referred to palliative care to help address goals and manage ongoing symptoms..   SOCIAL HISTORY:     reports that he has been smoking cigarettes. He has a 9.00 pack-year smoking history. He has quit using smokeless tobacco.  His smokeless tobacco use included snuff. He reports previous alcohol use of about 6.0 standard drinks of alcohol per week. He reports that he does not use drugs.   Patient is married. He lives at home with his wife. He has a son and daughter who live nearby. Patient used to work as a Theme park manager.  ADVANCE DIRECTIVES:  Not on file  CODE STATUS: DNR/DNI (MOST form completed on 01/11/2019)  PAST MEDICAL HISTORY: Past Medical History:  Diagnosis Date  . Anxiety   . Arthritis   . CHF (congestive heart failure) ([place])   . Coronary artery disease   . Diabetes mellitus   . Dyslipidemia   . Hx of CABG   . Hyperlipidemia   . Hypertension   . Malignant  neoplasm of unspecified part of unspecified bronchus or lung ([place]) 08/2018   Immunotherapy  . Pleural effusion     PAST SURGICAL HISTORY:  Past Surgical History:  Procedure Laterality Date  . CATARACT EXTRACTION Left 09/2015  . CHEST TUBE INSERTION Left 10/01/2018   Procedure: INSERTION PLEURAL DRAINAGE CATHETER;  Surgeon: [name], MD;  Location: [facility] ORS;  Service: Thoracic;  Laterality: Left;  . CORONARY ARTERY BYPASS GRAFT  2004   (CABG with LIMA to the  LAD, SVG to OM2/OM3, SVG  to diag  . DIALYSIS/PERMA CATHETER INSERTION N/A 03/14/2019   Procedure: DIALYSIS/PERMA CATHETER INSERTION;  Surgeon: Algernon Huxley, MD;  Location: [place] [facility];  Service: Cardiovascular;  Laterality: N/A;  . DIALYSIS/PERMA CATHETER REMOVAL N/A 05/28/2019   Procedure: DIALYSIS/PERMA CATHETER REMOVAL;  Surgeon: Katha Cabal, MD;  Location: [facility] [facility];  Service: Cardiovascular;  Laterality: N/A;  . Left ankle surgery     repair of fracture  . Right lower leg surgery     rod  . TEMPORARY DIALYSIS CATHETER N/A 03/11/2019   Procedure: TEMPORARY DIALYSIS CATHETER;  Surgeon: Algernon Huxley, MD;  Location: [place] [facility];  Service: Cardiovascular;  Laterality: N/A;    HEMATOLOGY/ONCOLOGY HISTORY:  Oncology History  Malignant neoplasm of unspecified part of unspecified bronchus or lung ([place])  09/08/2018 Initial Diagnosis   lung adenocarcinoma   10/19/2018 -  Chemotherapy   The patient had pembrolizumab (KEYTRUDA) 200 mg in sodium chloride 0.9 % 50 mL chemo infusion, 200 mg, Intravenous, Once, 7 of 12 cycles Administration:  200 mg (10/19/2018), 200 mg (11/09/2018), 200 mg (08/21/2019), 200 mg (05/08/2019), 200 mg (05/29/2019), 200 mg (06/19/2019), 200 mg (07/10/2019)  for chemotherapy treatment.      ALLERGIES:  is allergic to promethazine, trazodone and nefazodone, and lipitor [atorvastatin calcium].  MEDICATIONS:  Current Outpatient Medications  Medication Sig Dispense Refill  .  albuterol (VENTOLIN HFA) 108 (90 Base) MCG/ACT inhaler Inhale 2 puffs into the lungs every 6 (six) hours as needed for wheezing or shortness of breath.    [name] amiodarone (PACERONE) 200 MG tablet Take 1 tablet (200 mg total) by mouth daily. 90 tablet 3  . apixaban (ELIQUIS) 5 MG TABS tablet Take 1 tablet (5 mg total) by mouth 2 (two) times daily. 60 tablet 5  . DULoxetine (CYMBALTA) 30 MG capsule TAKE 1 CAPSULE BY MOUTH EVERY DAY 90 capsule 1  . ezetimibe (ZETIA) 10 MG tablet Take 1 tablet (10 mg total) by mouth daily. 30 tablet 5  . ferrous sulfate 325 (65 FE) MG tablet TAKE 1 TABLET (325 MG TOTAL) BY MOUTH 2 (TWO) TIMES DAILY WITH A MEAL. 785 tablet 0  . folic acid (FOLVITE) 885 MCG tablet Take 400 mcg by mouth daily.    . insulin detemir (LEVEMIR) 100 UNIT/ML injection Inject 0.04-0.05 mLs (4-5 Units total) into the skin at bedtime. if glucose over 190 mg/dl    . insulin lispro (HUMALOG) 100 UNIT/ML injection Inject 10-15 Units into the skin 3 (three) times daily. Patient takes 10 unit TID with meals plus additional units if needed based on glucose 20 mL 2  . ipratropium-albuterol (DUONEB) 0.5-2.5 (3) MG/3ML SOLN Take 3 mLs by nebulization 4 (four) times daily.    . metoprolol succinate (TOPROL-XL) 25 MG 24 hr tablet TAKE 1/2 TABLET BY MOUTH EVERY DAY 45 tablet 1  . multivitamin (RENA-VIT) TABS tablet Take 1 tablet by mouth at bedtime.  0  . potassium chloride (KLOR-CON) 10 MEQ tablet TAKE 1 TABLET BY MOUTH EVERY DAY 30 tablet 1  . promethazine (PHENERGAN) 25 MG tablet Take 25 mg by mouth every 6 (six) hours as needed for nausea or vomiting.    . rosuvastatin (CRESTOR) 10 MG tablet TAKE 1 TABLET (10 MG TOTAL) BY MOUTH DAILY. 90 tablet 3  . spironolactone (ALDACTONE) 25 MG tablet Take 1 tablet (25 mg total) by mouth daily. 30 tablet 1  . torsemide (DEMADEX) 20 MG tablet Take 4 tablets (80 mg total) by mouth daily. 120 tablet 1   No current facility-administered medications for this visit.     VITAL SIGNS: There were no vitals taken for this visit. There were no vitals filed for this visit.  Estimated body mass index is 26.18 kg/m as calculated from the following:   Height as of 11/22/19: 6\' 1"  (1.854 m).   Weight as of an earlier encounter on 11/25/19: 198 lb 6.4 oz (90 kg).  LABS: CBC:    Component Value Date/Time   WBC 6.3 11/01/2019 1213   HGB 10.1 (L) 11/01/2019 1213   HGB 9.4 (L) 10/17/2019 0914   HCT 30.4 (L) 11/01/2019 1213   HCT 29.2 (L) 10/17/2019 0914   PLT 280.0 11/01/2019 1213   PLT 229 10/17/2019 0914   MCV 96.0 11/01/2019 1213   MCV 95 10/17/2019 0914   NEUTROABS 4.8 11/01/2019 1213   NEUTROABS 4.7 10/17/2019 0914   LYMPHSABS 0.6 (L) 11/01/2019 1213   LYMPHSABS 0.7 10/17/2019 0914   MONOABS 0.8 11/01/2019 1213   EOSABS 0.1 11/01/2019 1213   EOSABS 0.1 10/17/2019  0914   BASOSABS 0.1 11/01/2019 1213   BASOSABS 0.1 10/17/2019 0914   Comprehensive Metabolic Panel:    Component Value Date/Time   NA 137 11/22/2019 0913   NA 137 10/17/2019 0914   K 3.7 11/22/2019 0913   CL 97 (L) 11/22/2019 0913   CO2 28 11/22/2019 0913   BUN 51 (H) 11/22/2019 0913   BUN 40 (H) 10/17/2019 0914   CREATININE 1.91 (H) 11/22/2019 0913   CREATININE 2.36 (H) 06/14/2019 1546   GLUCOSE 225 (H) 11/22/2019 0913   CALCIUM 8.3 (L) 11/22/2019 0913   AST 45 (H) 11/11/2019 0921   ALT 24 11/11/2019 0921   ALKPHOS 241 (H) 11/11/2019 0921   BILITOT 1.2 11/11/2019 0921   PROT 7.0 11/11/2019 0921   ALBUMIN 2.5 (L) 11/22/2019 0913    RADIOGRAPHIC STUDIES: CT Abdomen Pelvis Wo Contrast  Result Date: 11/22/2019 CLINICAL DATA:  Lung cancer follow-up, reportedly LEFT lung cancer with history of shortness of breath EXAM: CT CHEST, ABDOMEN AND PELVIS WITHOUT CONTRAST TECHNIQUE: Multidetector CT imaging of the chest, abdomen and pelvis was performed following the standard protocol without IV contrast. COMPARISON:  CT of the chest from January 22, 2019 and PET exam from July 18, 2019 FINDINGS: CT CHEST FINDINGS Cardiovascular: Calcified atheromatous plaque of the thoracic aorta. Signs of median sternotomy for coronary revascularization. Heart size top-normal. No significant pericardial effusion. Extensive coronary artery calcifications and signs of previous coronary revascularization. Central pulmonary vasculature mildly engorged. Limited assessment of cardiovascular structures given lack of intravenous contrast. Mediastinum/Nodes: Thoracic inlet structures are normal. No axillary lymphadenopathy. No mediastinal lymphadenopathy. Mild increase in size of mediastinal lymph nodes however since the prior study largest approximately 9 mm. No gross hilar adenopathy, limited assessment due to lack of intravenous contrast. Lungs/Pleura: Septal thickening and ground-glass that was seen on the previous study, PET scan of July 18, 2019 is markedly improved in the RIGHT chest. Still with some septal thickening and ground-glass in the LEFT chest. This is particularly true of the LEFT lung base with there is further consolidative change of LEFT lower lobe. Discrete nodules are however seen on today's evaluation (image 110, series 3) 10 mm RIGHT lower lobe nodule in aerated RIGHT lower lobe. At least 7 additional small nodules, smaller size than the above nodule or present the RIGHT lower lobe. RIGHT upper lobe nodule along the major fissure (image 51, series 3) 5 mm. Moderately large RIGHT pleural effusion slightly increased in size now with sub pulmonic component compared to the previous study. Chronic appearing lenticular LEFT-sided effusion tracking into the lateral LEFT costo diaphragmatic sulcus is stable. Signs of pleural thickening about the LEFT chest are similar Musculoskeletal: See below for full musculoskeletal detail. CT ABDOMEN PELVIS FINDINGS Hepatobiliary: Cholelithiasis. No acute process related to the gallbladder. No focal lesion seen on noncontrast imaging with similar contour of the  liver compared to the prior study. Pancreas: Pancreatic atrophy without signs of inflammation, ductal dilation or lesion. Spleen: Spleen normal in size and contour. Adrenals/Urinary Tract: Adrenal glands are normal Bilateral renal cortical scarring. No hydronephrosis. Urinary bladder is under distended. Renal vascular calcifications are similar to the prior study. Stomach/Bowel: Signs of mesenteric edema throughout the abdomen seen in the setting of ascites and body wall edema. No acute gastrointestinal process. Vascular/Lymphatic: Calcified atheromatous plaque in the abdominal aorta which is nonaneurysmal. There is no gastrohepatic or hepatoduodenal ligament lymphadenopathy. No retroperitoneal or mesenteric lymphadenopathy. No pelvic lymphadenopathy Reproductive: Prostate unremarkable by CT. Other: Extensive body wall edema. Moderate  volume ascites mainly about the liver and RIGHT hemiabdomen. Smaller volume in the LEFT hemiabdomen. Ascites does track into the pelvis. Musculoskeletal: Osteopenia with spinal degenerative changes. No acute or destructive bone process. IMPRESSION: 1. Moderately large RIGHT pleural effusion slightly increased in size now with sub pulmonic component compared to the previous study. Also associated with new pulmonary nodules, suspicious for worsening of disease and developing malignant effusion in the RIGHT chest. 2. Improved appearance of presumed pneumonitis throughout the RIGHT chest with some worsening of septal thickening and consolidative changes in the LEFT lung base. 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 Superimposed infection at the LEFT lung base in particular is not excluded. 3. Stable loculated, chronic appearing LEFT-sided effusion. 4. Mild increase in size of mediastinal lymph nodes however since the prior study, largest approximately 9 mm. These are nonspecific and could be reactive, but are larger compared to prior studies, attention on follow-up. 5. Anasarca. 6.  Cholelithiasis, aortic atherosclerosis and other incidental findings as outlined above. Aortic Atherosclerosis (ICD10-I70.0). Electronically Signed   By: Zetta Bills M.D.   On: 11/22/2019 12:41   CT Chest Wo Contrast  Result Date: 11/22/2019 CLINICAL DATA:  Lung cancer follow-up, reportedly LEFT lung cancer with history of shortness of breath EXAM: CT CHEST, ABDOMEN AND PELVIS WITHOUT CONTRAST TECHNIQUE: Multidetector CT imaging of the chest, abdomen and pelvis was performed following the standard protocol without IV contrast. COMPARISON:  CT of the chest from January 22, 2019 and PET exam from July 18, 2019 FINDINGS: CT CHEST FINDINGS Cardiovascular: Calcified atheromatous plaque of the thoracic aorta. Signs of median sternotomy for coronary revascularization. Heart size top-normal. No significant pericardial effusion. Extensive coronary artery calcifications and signs of previous coronary revascularization. Central pulmonary vasculature mildly engorged. Limited assessment of cardiovascular structures given lack of intravenous contrast. Mediastinum/Nodes: Thoracic inlet structures are normal. No axillary lymphadenopathy. No mediastinal lymphadenopathy. Mild increase in size of mediastinal lymph nodes however since the prior study largest approximately 9 mm. No gross hilar adenopathy, limited assessment due to lack of intravenous contrast. Lungs/Pleura: Septal thickening and ground-glass that was seen on the previous study, PET scan of July 18, 2019 is markedly improved in the RIGHT chest. Still with some septal thickening and ground-glass in the LEFT chest. This is particularly true of the LEFT lung base with there is further consolidative change of LEFT lower lobe. Discrete nodules are however seen on today's evaluation (image 110, series 3) 10 mm RIGHT lower lobe nodule in aerated RIGHT lower lobe. At least 7 additional small nodules, smaller size than the above nodule or present the RIGHT lower lobe.  RIGHT upper lobe nodule along the major fissure (image 51, series 3) 5 mm. Moderately large RIGHT pleural effusion slightly increased in size now with sub pulmonic component compared to the previous study. Chronic appearing lenticular LEFT-sided effusion tracking into the lateral LEFT costo diaphragmatic sulcus is stable. Signs of pleural thickening about the LEFT chest are similar Musculoskeletal: See below for full musculoskeletal detail. CT ABDOMEN PELVIS FINDINGS Hepatobiliary: Cholelithiasis. No acute process related to the gallbladder. No focal lesion seen on noncontrast imaging with similar contour of the liver compared to the prior study. Pancreas: Pancreatic atrophy without signs of inflammation, ductal dilation or lesion. Spleen: Spleen normal in size and contour. Adrenals/Urinary Tract: Adrenal glands are normal Bilateral renal cortical scarring. No hydronephrosis. Urinary bladder is under distended. Renal vascular calcifications are similar to the prior study. Stomach/Bowel: Signs of mesenteric edema throughout the abdomen seen in the setting of ascites and  body wall edema. No acute gastrointestinal process. Vascular/Lymphatic: Calcified atheromatous plaque in the abdominal aorta which is nonaneurysmal. There is no gastrohepatic or hepatoduodenal ligament lymphadenopathy. No retroperitoneal or mesenteric lymphadenopathy. No pelvic lymphadenopathy Reproductive: Prostate unremarkable by CT. Other: Extensive body wall edema. Moderate volume ascites mainly about the liver and RIGHT hemiabdomen. Smaller volume in the LEFT hemiabdomen. Ascites does track into the pelvis. Musculoskeletal: Osteopenia with spinal degenerative changes. No acute or destructive bone process. IMPRESSION: 1. Moderately large RIGHT pleural effusion slightly increased in size now with sub pulmonic component compared to the previous study. Also associated with new pulmonary nodules, suspicious for worsening of disease and developing  malignant effusion in the RIGHT chest. 2. Improved appearance of presumed pneumonitis throughout the RIGHT chest with some worsening of septal thickening and consolidative changes in the LEFT lung base. 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 Superimposed infection at the LEFT lung base in particular is not excluded. 3. Stable loculated, chronic appearing LEFT-sided effusion. 4. Mild increase in size of mediastinal lymph nodes however since the prior study, largest approximately 9 mm. These are nonspecific and could be reactive, but are larger compared to prior studies, attention on follow-up. 5. Anasarca. 6. Cholelithiasis, aortic atherosclerosis and other incidental findings as outlined above. Aortic Atherosclerosis (ICD10-I70.0). Electronically Signed   By: Zetta Bills M.D.   On: 11/22/2019 12:41    PERFORMANCE STATUS (ECOG) : 2 - Symptomatic, <50% confined to bed  Review of Systems Unless otherwise noted, a complete review of systems is negative.  Physical Exam General: NAD, frail appearing, in wheelchair Pulmonary: Unlabored Abdomen: soft, nontender, + bowel sounds GU: no suprapubic tenderness Extremities: Bilateral lower extremity edema, no joint deformities Skin: no rashes Neurological: Weakness but otherwise nonfocal  IMPRESSION: Routine follow-up visit.  Patient was accompanied by his wife.  CT of the chest, abdomen, and pelvis on 11/21/2019 revealed large right sided pleural effusion with new pulmonary nodules concerning for disease progression.  Patient saw Dr. Tasia Catchings today in clinic. Patient verbalized that the plan is for thoracentesis with fluid to be sent for cytology. Patient states that he is still not interested in chemotherapy. He reports that he has a long ablated his initial life expectancy at time of diagnosis. He would be in agreement with alternative oral or systemic immunotherapy if any options are available.  Symptomatically, patient has shortness of breath and  lower extremity edema. He has been hospitalized recently for CHF. He has follow-up with nephrology later today.  Patient reports his oral intake has significantly improved. He is consistently eating 3 meals a day.  Patient denies any pain at present.  He is functionally limited by generalized weakness and lower extremity edema. He uses a walker to ambulate at home. He denies any recent falls. He uses a lift chair which assist with standing.  PLAN: -Continue current scope of treatment -Follow-up MyChart visit 3 to 4 weeks  Case and plan discussed with Dr. Tasia Catchings   Patient expressed understanding and was in agreement with this plan. He also understands that He can call the clinic at any time with any questions, concerns, or complaints.     Time Total: 20 minutes  Visit consisted of counseling and education dealing with the complex and emotionally intense issues of symptom management and palliative care in the setting of serious and potentially life-threatening illness.Greater than 50%  of this time was spent counseling and coordinating care related to the above assessment and plan.  Signed by: Altha Harm, PhD, NP-C

## 2019-11-26 ENCOUNTER — Ambulatory Visit
Admission: RE | Admit: 2019-11-26 | Discharge: 2019-11-26 | Disposition: A | Discharge status: Home / Self Care | Payer: Medicare Other | Source: Ambulatory Visit | Attending: Radiology | Admitting: Radiology

## 2019-11-26 ENCOUNTER — Other Ambulatory Visit: Payer: Self-pay

## 2019-11-26 ENCOUNTER — Ambulatory Visit
Admission: RE | Admit: 2019-11-26 | Discharge: 2019-11-26 | Disposition: A | Discharge status: Home / Self Care | Payer: Medicare Other | Source: Ambulatory Visit | Attending: Oncology | Admitting: Oncology

## 2019-11-26 DIAGNOSIS — J90 Pleural effusion, not elsewhere classified: Secondary | ICD-10-CM | POA: Diagnosis not present

## 2019-11-26 DIAGNOSIS — Z9889 Other specified postprocedural states: Secondary | ICD-10-CM

## 2019-11-26 DIAGNOSIS — C3490 Malignant neoplasm of unspecified part of unspecified bronchus or lung: Secondary | ICD-10-CM

## 2019-11-26 LAB — LACTATE DEHYDROGENASE, PLEURAL OR PERITONEAL FLUID: LD, Fluid: 70 U/L — ABNORMAL HIGH (ref 3–23)

## 2019-11-26 LAB — BODY FLUID CELL COUNT WITH DIFFERENTIAL
Eos, Fluid: 0 %
Lymphs, Fluid: 35 %
Monocyte-Macrophage-Serous Fluid: 32 %
Neutrophil Count, Fluid: 33 %
Total Nucleated Cell Count, Fluid: 143 cu mm

## 2019-11-26 LAB — PROTEIN, PLEURAL OR PERITONEAL FLUID: Total protein, fluid: <3 g/dL

## 2019-11-26 LAB — ALBUMIN, PLEURAL OR PERITONEAL FLUID: Albumin, Fluid: <1 g/dL

## 2019-11-26 NOTE — Procedures (Addendum)
Ultrasound-guided diagnostic and therapeutic right thoracentesis performed yielding 1.8 iters of yellow fluid. No immediate complications. Follow-up chest x-ray pending.A portion of the fluid was sent to the lab for preordered studies. EBL none.

## 2019-11-28 LAB — PROTEIN, BODY FLUID (OTHER): Total Protein, Body Fluid Other: 1.8 g/dL

## 2019-11-29 LAB — CYTOLOGY - NON PAP

## 2019-12-05 ENCOUNTER — Telehealth: Payer: Self-pay

## 2019-12-05 DIAGNOSIS — C3490 Malignant neoplasm of unspecified part of unspecified bronchus or lung: Secondary | ICD-10-CM

## 2019-12-05 NOTE — Telephone Encounter (Signed)
Patient's wife, Georga Kaufmann, notified and is aware he will need chest xray prior to next MD visit. (order placed)

## 2019-12-05 NOTE — Telephone Encounter (Signed)
-----   Message from Earlie Server, MD sent at 12/04/2019  9:19 PM EST ----- Let him know that fluid from right side chest showed no cancer cells. I recommend him to follow up with me in 6 weeks. Thanks. Also arrange him to do a CXR a few days prior to visit.  Lab- cbc cmp md thanks.

## 2019-12-06 NOTE — Telephone Encounter (Signed)
Done...  Lab/MD in approx 6 weeks appt sched Pts wife Georga Kaufmann was made aware

## 2019-12-09 ENCOUNTER — Other Ambulatory Visit: Payer: Self-pay

## 2019-12-09 ENCOUNTER — Ambulatory Visit
Admission: RE | Admit: 2019-12-09 | Discharge: 2019-12-09 | Disposition: A | Discharge status: Home / Self Care | Payer: Medicare Other | Source: Ambulatory Visit | Attending: Nephrology | Admitting: Nephrology

## 2019-12-09 DIAGNOSIS — R609 Edema, unspecified: Secondary | ICD-10-CM | POA: Diagnosis not present

## 2019-12-09 MED ORDER — FUROSEMIDE 10 MG/ML IJ SOLN: 80.0000 mg | Freq: Once | INTRAMUSCULAR | Status: AC

## 2019-12-09 MED ORDER — POTASSIUM CHLORIDE CRYS ER 20 MEQ PO TBCR
EXTENDED_RELEASE_TABLET | ORAL | Status: AC
  Administered 2019-12-09: 40 meq via ORAL
  Filled 2019-12-09: qty 2

## 2019-12-09 MED ORDER — FUROSEMIDE 10 MG/ML IJ SOLN
INTRAMUSCULAR | Status: AC
  Administered 2019-12-09: 80 mg via INTRAVENOUS
  Filled 2019-12-09: qty 8

## 2019-12-09 MED ORDER — POTASSIUM CHLORIDE CRYS ER 20 MEQ PO TBCR: 40.0000 meq | EXTENDED_RELEASE_TABLET | Freq: Two times a day (BID) | ORAL | Status: DC

## 2019-12-09 MED ORDER — SODIUM CHLORIDE FLUSH 0.9 % IV SOLN
INTRAVENOUS | Status: AC
  Filled 2019-12-09: qty 20

## 2019-12-09 NOTE — Discharge Instructions (Signed)
Heart Failure, Diagnosis  Heart failure is a condition in which the heart has trouble pumping blood because it has become weak or stiff. This means that the heart does not pump blood well enough for the body to stay healthy. For some people with heart failure, fluid may back up into the lungs. There may also be swelling (edema) in the lower legs. Heart failure is usually a long-term (chronic) condition. It is important for you to take good care of yourself and follow the treatment plan from your health care provider. What are the causes? This condition may be caused by:  High blood pressure (hypertension). Hypertension causes the heart muscle to work harder than normal. This makes the heart stiff or weak.  Coronary artery disease, or CAD. CAD is the buildup of cholesterol and fat (plaque) in the arteries of the heart.  Heart attack, also called myocardial infarction. This injures the heart muscle, making it hard for the heart to pump blood.  Abnormal heart valves. The valves do not open and close properly, forcing the heart to pump harder to keep the blood flowing.  Heart muscle disease (cardiomyopathy or myocarditis). This is damage to the heart muscle. It can increase the risk of heart failure.  Lung disease. The heart works harder when the lungs are not healthy.  Abnormal heart rhythms. These can lead to heart failure. What increases the risk? The risk of heart failure increases as a person ages. This condition is also more likely to develop in people who:  Are overweight.  Are male.  Smoke or chew tobacco.  Abuse alcohol or illegal drugs.  Have taken medicines that can damage the heart, such as chemotherapy drugs.  Have diabetes.  Have abnormal heart rhythms.  Have thyroid problems.  Have low blood counts (anemia). What are the signs or symptoms? Symptoms of this condition include:  Shortness of breath with activity, such as when climbing stairs.  A cough that does not  go away.  Swelling of the feet, ankles, legs, or abdomen.  Losing weight for no reason.  Trouble breathing when lying flat (orthopnea).  Waking from sleep because of the need to sit up and get more air.  Rapid heartbeat.  Tiredness (fatigue) and loss of energy.  Feeling light-headed, dizzy, or close to fainting.  Loss of appetite.  Nausea.  Waking up more often during the night to urinate (nocturia).  Confusion. How is this diagnosed? This condition is diagnosed based on:  Your medical history, symptoms, and a physical exam.  Diagnostic tests, which may include: ? Echocardiogram. ? Electrocardiogram (ECG). ? Chest X-ray. ? Blood tests. ? Exercise stress test. ? Radionuclide scans. ? Cardiac catheterization and angiogram. How is this treated? Treatment for this condition is aimed at managing the symptoms of heart failure. Medicines Treatment may include medicines that:  Help lower blood pressure by relaxing (dilating) the blood vessels. These medicines are called ACE inhibitors (angiotensin-converting enzyme) and ARBs (angiotensin receptor blockers).  Cause the kidneys to remove salt and water from the blood through urination (diuretics).  Improve heart muscle strength and prevent the heart from beating too fast (beta blockers).  Increase the force of the heartbeat (digoxin). Healthy behavior changes     Treatment may also include making healthy lifestyle changes, such as:  Reaching and staying at a healthy weight.  Quitting smoking or chewing tobacco.  Eating heart-healthy foods.  Limiting or avoiding alcohol.  Stopping the use of illegal drugs.  Being physically active.  Other treatments   Other treatments may include:  Procedures to open blocked arteries or repair damaged valves.  Placing a pacemaker to improve heart function (cardiac resynchronization therapy).  Placing a device to treat serious abnormal heart rhythms (implantable cardioverter  defibrillator, or ICD).  Placing a device to improve the pumping ability of the heart (left ventricular assist device, or LVAD).  Receiving a healthy heart from a donor (heart transplant). This is done when other treatments have not helped. Follow these instructions at home:  Manage other health conditions as told by your health care provider. These may include hypertension, diabetes, thyroid disease, or abnormal heart rhythms.  Get ongoing education and support as needed. Learn as much as you can about heart failure.  Keep all follow-up visits as told by your health care provider. This is important. Summary  Heart failure is a condition in which the heart has trouble pumping blood because it has become weak or stiff.  This condition is caused by high blood pressure and other diseases of the heart and lungs.  Symptoms of this condition include shortness of breath, tiredness (fatigue), nausea, and swelling of the feet, ankles, legs, or abdomen.  Treatments for this condition may include medicines, lifestyle changes, and surgery.  Manage other health conditions as told by your health care provider. This information is not intended to replace advice given to you by your health care provider. Make sure you discuss any questions you have with your health care provider. Document Revised: 03/09/2018 Document Reviewed: 03/09/2018 [employer] Patient Education  2020 [employer]  

## 2019-12-11 ENCOUNTER — Ambulatory Visit
Admission: RE | Admit: 2019-12-11 | Discharge: 2019-12-11 | Disposition: A | Discharge status: Home / Self Care | Payer: Medicare Other | Source: Ambulatory Visit | Attending: Nephrology | Admitting: Nephrology

## 2019-12-11 ENCOUNTER — Other Ambulatory Visit: Payer: Self-pay

## 2019-12-11 DIAGNOSIS — R600 Localized edema: Secondary | ICD-10-CM | POA: Diagnosis not present

## 2019-12-11 MED ORDER — FUROSEMIDE 10 MG/ML IJ SOLN
INTRAMUSCULAR | Status: AC
  Administered 2019-12-11: 80 mg via INTRAVENOUS
  Filled 2019-12-11: qty 8

## 2019-12-11 MED ORDER — POTASSIUM CHLORIDE CRYS ER 20 MEQ PO TBCR: 40.0000 meq | EXTENDED_RELEASE_TABLET | Freq: Once | ORAL | Status: AC

## 2019-12-11 MED ORDER — FUROSEMIDE 10 MG/ML IJ SOLN: 80.0000 mg | Freq: Once | INTRAMUSCULAR | Status: AC

## 2019-12-11 MED ORDER — POTASSIUM CHLORIDE CRYS ER 20 MEQ PO TBCR
EXTENDED_RELEASE_TABLET | ORAL | Status: AC
  Administered 2019-12-11: 40 meq via ORAL
  Filled 2019-12-11: qty 2

## 2019-12-13 ENCOUNTER — Other Ambulatory Visit: Payer: Self-pay

## 2019-12-13 ENCOUNTER — Telehealth: Payer: Self-pay

## 2019-12-13 ENCOUNTER — Telehealth: Payer: Medicare Other

## 2019-12-13 ENCOUNTER — Ambulatory Visit
Admission: RE | Admit: 2019-12-13 | Discharge: 2019-12-13 | Disposition: A | Discharge status: Home / Self Care | Payer: Medicare Other | Source: Ambulatory Visit | Attending: Nephrology | Admitting: Nephrology

## 2019-12-13 DIAGNOSIS — C3492 Malignant neoplasm of unspecified part of left bronchus or lung: Secondary | ICD-10-CM | POA: Diagnosis not present

## 2019-12-13 DIAGNOSIS — R600 Localized edema: Secondary | ICD-10-CM | POA: Diagnosis not present

## 2019-12-13 LAB — RENAL FUNCTION PANEL
Albumin: 2.5 g/dL — ABNORMAL LOW (ref 3.5–5.0)
Anion gap: 13 (ref 5–15)
BUN: 51 mg/dL — ABNORMAL HIGH (ref 8–23)
CO2: 26 mmol/L (ref 22–32)
Calcium: 8.3 mg/dL — ABNORMAL LOW (ref 8.9–10.3)
Chloride: 99 mmol/L (ref 98–111)
Creatinine, Ser: 1.9 mg/dL — ABNORMAL HIGH (ref 0.61–1.24)
GFR, Estimated: 39 mL/min — ABNORMAL LOW (ref >60)
Glucose, Bld: 308 mg/dL — ABNORMAL HIGH (ref 70–99)
Phosphorus: 4 mg/dL (ref 2.5–4.6)
Potassium: 4.1 mmol/L (ref 3.5–5.1)
Sodium: 138 mmol/L (ref 135–145)

## 2019-12-13 MED ORDER — POTASSIUM CHLORIDE CRYS ER 20 MEQ PO TBCR
EXTENDED_RELEASE_TABLET | ORAL | Status: AC
  Administered 2019-12-13: 40 meq via ORAL
  Filled 2019-12-13: qty 2

## 2019-12-13 MED ORDER — POTASSIUM CHLORIDE CRYS ER 20 MEQ PO TBCR: 40.0000 meq | EXTENDED_RELEASE_TABLET | Freq: Two times a day (BID) | ORAL | Status: DC

## 2019-12-13 MED ORDER — FUROSEMIDE 10 MG/ML IJ SOLN
INTRAMUSCULAR | Status: AC
  Administered 2019-12-13: 80 mg via INTRAVENOUS
  Filled 2019-12-13: qty 8

## 2019-12-13 MED ORDER — FUROSEMIDE 10 MG/ML IJ SOLN: 80.0000 mg | Freq: Once | INTRAMUSCULAR | Status: AC

## 2019-12-13 NOTE — Telephone Encounter (Signed)
Attempted to reach patient by telephone for follow up CCM visit on 12/13/19 at 11 AM. Left voicemail with contact information.  Debbora Dus, PharmD Clinical Pharmacist [facility] [phone]

## 2019-12-13 NOTE — Chronic Care Management (AMB) (Deleted)
Chronic Care Management Pharmacy   Name: Craig Koch          MRN: [id number]        DOB: [birth year]   Chief Complaint/ HPI   Craig Koch,  64 y.o. , male presents for their Follow-Up CCM visit with the clinical pharmacist via telephone due to COVID-19 Pandemic.   PCP : Tonia Ghent, MD   Their chronic conditions include: hypertension, CAD, atrial fibrillation, NSTEMI, heart failure, Diabetes, hyperlipidemia, lower urinary tract symptoms,    Office Visits:  08/15/2019 - Trazodone 25-50 mg at night for sleep.   06/14/2019 - Increase furosemde 2 tablets daily until weight is down/swelling reduced.   05/14/2019 -Discussed tapering Levemir since his low sugars tend to happen in the early morning.  Goal AM sugar above 100.  Continue sliding scale insulin at baseline with meals.  He can update me about his sugar in the near future. Go to the lab on the way out for a chest xray. Amoxicillin and Doxycycline given for HCAP.   04/12/2019 - Discussed lowering his Levemir dose at nighttime and still continue with sliding scale insulin.  Goal to avoid low sugars.  Cut back on levemir by 1 unit a day until AM sugar ~120.  If you still have trouble with lows then let me know.  Use the inhaler before bed and see if that helps with the cough. Consult Visit:  10/05/2019 - ED for nosedbleed. Keflex presribed. Hold Plavix and Eliquis for 7 days.   09/25/2019 - Oncology - repeat CT at the end of October. Hold off on treatment and address other chronic problems.   09/17/2019 - Cardiology - defer titration of furosemide to nephrology.   09/13/2019 - Pulmonology - albuterol mdi, trelegy continued and Duoneb nebulizer initiated.   09/11/2019 - Oncology - no evidence of lung cancer recurrence or metastatic disease. Hold Keytruda due to increased alkaline phosphatase and tranaminitis. BRAF targeted therapy discontinued due to acute CHF, DKA, renal failure.   09/10/2019 - Cardiology - encouraged  ace wraps for leg swelling and elevating legs while sitting for long periods of time.   09/06/2019 - cardiology - decrease to furosemide 40 mg daily.   08/21/2019 - Oncology - infusion.   08/20/2019 - Palliative Care - no complaints or changes at this time.   07/31/2019 -  Furosemide 40 mg daily. Finish course of levaquin for exertional dyspnea.   07/30/2019 - Increase to lasix 23m qd for 3-5 days then drop back down to every other day furosemide 419m  Recheck BMET in 1 week. Loop in nephrology. Slow changes in position given h/o orthostatic hypotension. Discuss ongoing amiodarone with primary cardiologist (amiodarone lung considered as well). Smoking cessation advised as outlined below.   07/29/2019 - start levaquin for shortness of breath.   07/25/2019 -  Nephrology - . Chronic kidney disease stage IIIb. Most recent EGFR was found to be 39. Previously had acute kidney injury requiring dialysis but it appears that he is made a full recovery. However suspect that he has been left with some element of chronic kidney disease. Follow-up renal parameters today. Increase protein intake and continue furosemide qod.   07/10/2019 - oncology - infusion.   06/19/2019 - oncology infusion.   06/18/2019 - Palliative care. Coordinated nebulizer machine.  06/17/2019 - Nephrology. Doing well with dialysis. Furosemide 40 mg daily.   05/24/2019 - Cardiology - Lasix daily for now. Keflex after urine sample.   05/20/2019 - Nephrology -  Acute kidney injury. Patient was started on dialysis in March 2021. We were able to successfully wean him off of dialysis. Most recent creatinine was 2.28 with an EGFR of 29.   05/20/2019 - Oncology - Is currently off hemodialysis.  Creatinine may reflect his true kidney function without dialysis.  I advised patient to clarify with nephrology if any protein limitations.His nutrition status is low.  Albumin 2.7.  If no protein restriction per nephrology, recommend patient to  take nutrition supplements.  Continue follow-up with nutritionist.Anemia, likely secondary to CKD.  Monitor hemoglobin.  Erythropoietin is contraindicated due to active cancer.  Check iron panel at the next visit. Pneumonia,, currently on oral antibiotic treatments. Chest x-ray was independently reviewed and discussed patient. 05/10/2019 - Cardiology - Afib - amiodarone 200 mg daily. Restart Eliquis 5 mg bid. With ongoing or symptomatic hypotension, may need to consider decreasing or holding his BB to prevent prerenal AKI. Continue BB as BP allows, as well as rosuvastatin and newly started Zetia for risk factor modification.  05/08/2019 - Oncology - immunotherapy.  05/01/2019 - Oncology - Omniseq showed [id number] mutation, results were discussed with patient and wife.BRAF targeted therapy was discontinued due to patient's recent acute illness including acute CHF, DKA, renal failure on hemodialysis.   04/24/2019 - Nutrition consult for weight loss. Encouraged patient to consume adequate calories/protein.   04/18/2019 - palliative home care visit.   04/17/2019 - oncology - encounter for immunotherapy.   04/08/2019 - oncology - stage IV metastatic lung adenocarcinoma. Patient wants to try immunotherapy.   04/05/2019 - Cardiology - NYHA class III. Unable to add entresto or titrate up metoprolol succinate due to low bp. Palliative care involved.   03/26/2019 - ED to Hospital Admission for SOB.   03/03/2019 - ED to Hospital admission for DKA. Medications:     Outpatient Encounter Medications as of 10/11/2019  Medication Sig  . apixaban (ELIQUIS) 5 MG TABS tablet Take 1 tablet (5 mg total) by mouth 2 (two) times daily.  . [DISCONTINUED] clopidogrel (PLAVIX) 75 MG tablet TAKE 1 TABLET BY MOUTH EVERY DAY  . albuterol (VENTOLIN HFA) 108 (90 Base) MCG/ACT inhaler Inhale 2 puffs into the lungs every 6 (six) hours as needed for wheezing or shortness of breath.  . bismuth subsalicylate (PEPTO-BISMOL) 262  MG/15ML suspension Take 30 mLs by mouth every 6 (six) hours as needed for diarrhea or loose stools.  . [EXPIRED] cephALEXin (KEFLEX) 500 MG capsule Take 1 capsule (500 mg total) by mouth 3 (three) times daily for 7 days.  . DULoxetine (CYMBALTA) 30 MG capsule TAKE 1 CAPSULE BY MOUTH EVERY DAY  . ezetimibe (ZETIA) 10 MG tablet Take 1 tablet (10 mg total) by mouth daily.  . ferrous sulfate 325 (65 FE) MG tablet TAKE 1 TABLET (325 MG TOTAL) BY MOUTH 2 (TWO) TIMES DAILY WITH A MEAL.  . folic acid (FOLVITE) 563 MCG tablet Take 400 mcg by mouth daily.  . insulin aspart (NOVOLOG) 100 UNIT/ML injection Inject 0-8 Units into the skin as directed. Take 0 units if CBG 70-150 Take 1 units if CBG 151-200 Take 2 units if CBG 201-250 Take 3 units if CBG 251-300 Take 4 units if CBG 301-350 Take 6 units if CBG 351-400 If CBG > 400, give 8 units and call MD  . insulin detemir (LEVEMIR) 100 UNIT/ML injection Inject 0.2 mLs (20 Units total) into the skin at bedtime.  [name] ipratropium-albuterol (DUONEB) 0.5-2.5 (3) MG/3ML SOLN Take 3 mLs by nebulization 4 (four) times daily.  [name]  metoprolol succinate (TOPROL-XL) 25 MG 24 hr tablet Take 0.5 tablets (12.5 mg total) by mouth daily.  . Multiple Vitamin (MULTIVITAMIN WITH MINERALS) TABS tablet Take 1 tablet by mouth at bedtime.  . multivitamin (RENA-VIT) TABS tablet Take 1 tablet by mouth at bedtime.  . potassium chloride (KLOR-CON) 10 MEQ tablet TAKE 1 TABLET BY MOUTH EVERY DAY  . promethazine (PHENERGAN) 25 MG tablet Take 25 mg by mouth every 6 (six) hours as needed for nausea or vomiting.  . rosuvastatin (CRESTOR) 10 MG tablet TAKE 1 TABLET (10 MG TOTAL) BY MOUTH DAILY.  . [DISCONTINUED] amiodarone (PACERONE) 200 MG tablet TAKE 1 TABLET BY MOUTH EVERY DAY  . [DISCONTINUED] ferrous sulfate 325 (65 FE) MG tablet TAKE 1 TABLET (325 MG TOTAL) BY MOUTH 2 (TWO) TIMES DAILY WITH A MEAL.  . [DISCONTINUED] furosemide (LASIX) 40 MG tablet If lightheaded or SBP <100, then take 63m  lasix a day.  If not lightheaded and SBP >100, then take 1224mlasix a day. (Patient taking differently: If lightheaded or SBP <100, then take 8066masix a day.  If not lightheaded and SBP >100, then take 120m68msix a day.   09/25/19-taking 1 QD)  . [DISCONTINUED] potassium chloride (KLOR-CON) 10 MEQ tablet TAKE 1 TABLET BY MOUTH EVERY DAY    No facility-administered encounter medications on file as of 10/11/2019.    Allergies  Allergen Reactions  . Promethazine        QT prolongation.   . Trazodone And Nefazodone        QT prolongation.   . Lipitor [Atorvastatin Calcium] Other (See Comments)      Aches.  Tolerated crestor.     SDOH Screenings       Alcohol Screen:   . Last Alcohol Screening Score (AUDIT): Not on file  Depression (PHQ2-9):   . PHQ-2 Score: Not on file  Financial Resource Strain:   . Difficulty of Paying Living Expenses: Not on file  Food Insecurity:   . Worried About RunnCharity fundraiserthe Last Year: Not on file  . Ran Out of Food in the Last Year: Not on file  Housing:   . Last Housing Risk Score: Not on file  Physical Activity:   . Days of Exercise per Week: Not on file  . Minutes of Exercise per Session: Not on file  Social Connections:   . Frequency of Communication with Friends and Family: Not on file  . Frequency of Social Gatherings with Friends and Family: Not on file  . Attends Religious Services: Not on file  . Active Member of Clubs or Organizations: Not on file  . Attends ClubArchivisttings: Not on file  . Marital Status: Not on file  Stress:   . Feeling of Stress : Not on file  Tobacco Use: High Risk  . Smoking Tobacco Use: Current Every Day Smoker  . Smokeless Tobacco Use: Former UserSoil scientistds:   . LackFilm/video editordical): Not on file  . Lack of Transportation (Non-Medical): Not on file      Current Diagnosis/Assessment:              Goals Addressed                                This  Visit's Progress     . Pharmacy Care Plan  CARE PLAN ENTRY (see longitudinal plan of care for additional care plan information)   Current Barriers:   Chronic Disease Management support, education, and care coordination needs related to Diabetes and Atrial Fibrillation   Diabetes      Lab Results  Component Value Date/Time    HGBA1C 7.2 (A) 05/14/2019 08:54 AM    HGBA1C 8.8 (H) 03/26/2019 09:55 PM    HGBA1C 11.3 (H) 03/04/2019 10:59 AM     Pharmacist Clinical Goal(s): ? Over the next 90 days, patient will work with PharmD and providers to achieve A1c goal <7%  Current regimen:   Novolog 0-8 units sliding scale before meals   Levemir 100 units/ml 20 units at bedtime  Interventions: ? Working to improve access to insulin regimen via patient assistance.   Patient self care activities - Over the next 90 days, patient will: ? Check blood sugar 3-4 times daily, document, and provide at future appointments ? Contact provider with any episodes of hypoglycemia ? Complete Patient Assistance Application, have provider sign and submit.     Atrial Fibrillation  Pharmacist Clinical Goal(s) ? Over the next 90 days, patient will work with PharmD and providers to work on [employer] affordability  Current regimen:   Eliquis 5 mg bid   Amiodarone 200 mg daily  Interventions: ? Complete patient assistance application and submit to company for Eliquis.   Patient self care activities - Over the next 90 days, patient will: ? Continue to take medications as prescribed.  ? Contact pharmacist or provider with any questions or concerns.    Medication management  Pharmacist Clinical Goal(s): ? Over the next 90 days, patient will work with PharmD and providers to maintain optimal medication adherence  Current [facility]  Interventions ? Comprehensive medication review performed. ? Continue current medication management strategy  Patient self care activities - Over the  next 90 days, patient will: ? Focus on medication adherence by continuing to use pill box.  ? Take medications as prescribed ? Report any questions or concerns to PharmD and/or provider(s)   Please see past updates related to this goal by clicking on the "Past Updates" button in the selected goal              Diabetes    Recent Relevant Labs: Labs (Brief)       Lab Results  Component Value Date/Time    HGBA1C 7.2 (A) 05/14/2019 08:54 AM    HGBA1C 8.8 (H) 03/26/2019 09:55 PM    HGBA1C 11.3 (H) 03/04/2019 10:59 AM    MICROALBUR 9.9 (H) 02/17/2015 03:25 PM      Kidney Function Labs (Brief)       Lab Results  Component Value Date/Time    CREATININE 2.08 (H) 10/17/2019 09:14 AM    CREATININE 1.85 (H) 10/04/2019 08:11 PM    CREATININE 2.36 (H) 06/14/2019 03:46 PM    GFR 40.72 (L) 08/23/2019 08:11 AM    GFRNONAA 33 (L) 10/17/2019 09:14 AM    GFRAA 38 (L) 10/17/2019 09:14 AM    K 3.6 10/17/2019 09:14 AM    K 4.2 10/04/2019 08:11 PM          Checking BG: 3x per Day before meals and at bedtime.    Recent FBG Readings: 180 mg/dL  Recent pre-meal BG readings: 130-170 mg/dL Recent HS BG readings: 200 mg/dL Patient has failed these meds in past: byetta, glipizide, metformin Patient is currently uncontrolled on the following medications:   Novolog 0-8 units sliding scale before  meals   Levemir 100 units/ml 20 units at bedtime   Last diabetic Foot exam:  Labs (Brief)       Lab Results  Component Value Date/Time    HMDIABEYEEXA Retinopathy (A) 09/06/2017 12:00 AM      Last diabetic Eye exam:   Labs (Brief)  No results found for: HMDIABFOOTEX      We discussed: diet and exercise extensively. Patient encouraged to get calorie/protein per oncology. He is eating a variety of foods trying to be mindful of protein. Patient's insulins are expensive on top of all other medical expenses. Pharmacist working with Dr. Damita Dunnings to get Novolog and Levemir approved via patient  assistance.    Update 10/11/2019 - Reports "good" blood sugars. Occasional 230 mg/dL. Overall less thatn 190 mg/dL. Sliding scale insulin for meal time. Uses 6-15 units depending on blood sugar. He states that he adjusts it on his own. Reports rarely low blood sugar.    Plan   Continue current medications and submit patient assistance application.    AFIB    Patient is currently rhythm controlled. HR 82 BPM   Patient has failed these meds in past: n/a Patient is currently controlled on the following medications:   Eliquis 5 mg bid   Amiodarone 200 mg daily   We discussed:  Patient's Eliquis is cost prohibitive at this time. Pharmacist coordinating with cardiology to work on PAP application and to check for samples. Patient advised to bring proof of income and proof of medication expenses to the cardiology visit 08/06/2019.    Update 10/11/2019 - Patient has not submitted application for Eliquis at this time have encouraged patient to submit. Application is completed and signed.    Plan   Continue current medications    Hyperlipidemia    LDL goal < 70   Lipid Panel  Labs (Brief)          Component Value Date/Time    CHOL 85 (L) 05/24/2019 1042    TRIG 47 05/24/2019 1042    HDL 41 05/24/2019 1042    LDLCALC 32 05/24/2019 1042    LDLDIRECT 31 05/24/2019 1042    LDLDIRECT 84.0 08/18/2015 0850      Hepatic Function Latest Ref Rng & Units 10/04/2019 09/25/2019 09/11/2019  Total Protein 6.5 - 8.1 g/dL 6.3(L) 6.6 7.2  Albumin 3.5 - 5.0 g/dL 2.7(L) 2.6(L) 2.4(L)  AST 15 - 41 U/L 58(H) 77(H) 58(H)  ALT 0 - 44 U/L 65(H) 64(H) 48(H)  Alk Phosphatase 38 - 126 U/L 240(H) 290(H) 507(H)  Total Bilirubin 0.3 - 1.2 mg/dL 1.7(H) 0.9 0.8      The ASCVD Risk score Mikey Bussing DC Jr., et al., 2013) failed to calculate for the following reasons:   The patient has a prior MI or stroke diagnosis    Patient has failed these meds in past: none reported Patient is currently controlled on the  following medications:   Rosuvastatin 10 mg daily  Zetia 10 mg daily    We discussed:  diet and exercise extensively    Update: Patient reports taking daily as prescribed. Lipid panel at goal.    Plan   Continue current medications     COPD / Asthma / Tobacco    Eosinophil count:   Labs (Brief)       Lab Results  Component Value Date/Time    EOSPCT 0 10/04/2019 08:11 PM    %  Eos (Absolute):  Labs (Brief)       Lab Results  Component Value Date/Time    EOSABS 0.1 10/17/2019 09:14 AM        Tobacco Status:  Social History        Tobacco Use  Smoking Status Current Every Day Smoker  . Packs/day: 0.20  . Years: 45.00  . Pack years: 9.00  . Types: Cigarettes  . Last attempt to quit: 03/05/2019  . Years since quitting: 0.6  Smokeless Tobacco Former Systems developer  . Types: Snuff     Patient has failed these meds in past: none reported Patient is currently uncontrolled on the following medications:   Duoneb 1 vial via nebulzer every 4 hours prn  Albuterol inhaler 2 puffs every 6 hours prn   We discussed:  smoking cessation.    Smoking 3-4 cigarettes per day right now. 50 year habit.    Plan   Continue current medications        Heart Failure    Type: Combined Systolic and Diastolic   Last ejection fraction: 30-35% NYHA Class: III (marked limitation of activity)   Vitals with BMI 10/17/2019 10/11/2019 10/04/2019  Height '6\' 0"'  '6\' 0"'  '6\' 0"'   Weight 216 lbs (No Data) 210 lbs  BMI 93.71 - 69.67  Systolic 893 810 175  Diastolic 64 66 63  Pulse 77 81 90      Patient has failed these meds in past: none reported  Patient is currently uncontrolled on the following medications:   Furosemide 40 mg bid  Metoprolol succinate 25 mg - 1/2 tablet daily     We discussed weighing daily; if you gain more than 3 pounds in one day or 5 pounds in one week call your doctor. Patient is frustrated with swelling in legs. States he is unable to  wear shoes. He reports elevating legs during the day. States he has an appointment with PCP office this afternoon to assess blister on his foot. Discussed following-up with cardiology.    Plan   Continue current medications   Health Maintenance    Patient is currently controlled on the following medications:   Peptobismol - 30 mls every 6 hours prn diarrhea or loose stools  Duloxetine 30 mg daily   Ferrous sulfate 325 mg twice daily   Folic acid 102 mcg daily   Multiple vitamin daily   Potassium chloride 10 meq daily  Promethazine 25 mg every 6 hours prn nausea or vomiting      Plan   Continue current medications     Medication Management    [facility] Uses pill box? Yes Pt endorses good compliance - Patient's fill history does not have gaps outside of hospitalizations.    We discussed: Patient's wife picks up his medications and prepares it each day for him. Patient is not driving at this time due to his health.    Plan   Continue current medication management strategy. Immediate needs addressed during today's visit to ensure patient has medication needed. Patient's Eliquis and Insulins are expensive on top of all other medical costs.        Follow up: 2 months

## 2019-12-13 NOTE — Discharge Instructions (Signed)
Furosemide (LasixT) What is this medication used for?  This medication is used to prevent excessive fluid in the lungs.  It can also be used to treat generalized swelling and high blood pressure.  How should this medication be given?  Tamala Ser well before measuring the dose.  . Measure the correct dose using an oral syringe. . Place the syringe in the infant's mouth and give small amounts, allowing time for them to swallow after each squirt.  . Should be given with food to avoid stomach upset. . Give at the same time every day to avoid changes in blood pressure.  What should be done if a dose is missed? If a dose is missed, give it as soon as you remember. If it is close to the time for the next dose, simply skip the missed dose and restart the regular dosing schedule. It is important NOT to give double the recommended dose.   Are there any side effects?  . Changes in electrolytes that may require your baby's doctor to check labs. . Low blood pressure, especially if given with other medications that reduce blood pressure. . May cause diarrhea, vomiting, and constipation.  Other important information: . Store at room temperature. . Do not stop this medication without calling your baby's doctor.

## 2019-12-16 ENCOUNTER — Inpatient Hospital Stay: Payer: Medicare Other | Attending: Hospice and Palliative Medicine | Admitting: Hospice and Palliative Medicine

## 2019-12-16 ENCOUNTER — Other Ambulatory Visit: Payer: Self-pay

## 2019-12-16 ENCOUNTER — Ambulatory Visit
Admission: RE | Admit: 2019-12-16 | Discharge: 2019-12-16 | Disposition: A | Discharge status: Home / Self Care | Payer: Medicare Other | Source: Ambulatory Visit | Attending: Nephrology | Admitting: Nephrology

## 2019-12-16 DIAGNOSIS — Z515 Encounter for palliative care: Secondary | ICD-10-CM

## 2019-12-16 DIAGNOSIS — C3490 Malignant neoplasm of unspecified part of unspecified bronchus or lung: Secondary | ICD-10-CM | POA: Diagnosis not present

## 2019-12-16 MED ORDER — POTASSIUM CHLORIDE CRYS ER 20 MEQ PO TBCR: 40.0000 meq | EXTENDED_RELEASE_TABLET | Freq: Once | ORAL | Status: AC

## 2019-12-16 MED ORDER — FUROSEMIDE 10 MG/ML IJ SOLN
INTRAMUSCULAR | Status: AC
  Administered 2019-12-16: 08:00:00 80 mg via INTRAVENOUS
  Filled 2019-12-16: qty 8

## 2019-12-16 MED ORDER — POTASSIUM CHLORIDE CRYS ER 20 MEQ PO TBCR
EXTENDED_RELEASE_TABLET | ORAL | Status: AC
  Administered 2019-12-16: 08:00:00 40 meq via ORAL
  Filled 2019-12-16: qty 2

## 2019-12-16 MED ORDER — FUROSEMIDE 10 MG/ML IJ SOLN: 80.0000 mg | Freq: Once | INTRAMUSCULAR | Status: AC

## 2019-12-16 NOTE — Progress Notes (Signed)
I was unable to reach patient for scheduled MyChart visit.  Will reschedule.

## 2019-12-18 ENCOUNTER — Other Ambulatory Visit: Payer: Self-pay

## 2019-12-18 ENCOUNTER — Ambulatory Visit
Admission: RE | Admit: 2019-12-18 | Discharge: 2019-12-18 | Disposition: A | Discharge status: Home / Self Care | Payer: Medicare Other | Source: Ambulatory Visit | Attending: Nephrology | Admitting: Nephrology

## 2019-12-18 DIAGNOSIS — R609 Edema, unspecified: Secondary | ICD-10-CM | POA: Diagnosis not present

## 2019-12-18 DIAGNOSIS — I5043 Acute on chronic combined systolic (congestive) and diastolic (congestive) heart failure: Secondary | ICD-10-CM | POA: Diagnosis not present

## 2019-12-18 MED ORDER — FUROSEMIDE 10 MG/ML IJ SOLN
80.0000 mg | Freq: Once | INTRAMUSCULAR | Status: AC
  Administered 2019-12-18: 09:00:00 80 mg via INTRAVENOUS

## 2019-12-18 MED ORDER — POTASSIUM CHLORIDE 20 MEQ PO PACK
40.0000 meq | PACK | Freq: Once | ORAL | Status: AC
  Administered 2019-12-18: 09:00:00 40 meq via ORAL
  Filled 2019-12-18: qty 2

## 2019-12-20 ENCOUNTER — Ambulatory Visit
Admission: RE | Admit: 2019-12-20 | Discharge: 2019-12-20 | Disposition: A | Discharge status: Home / Self Care | Payer: Medicare Other | Source: Ambulatory Visit | Attending: Nephrology | Admitting: Nephrology

## 2019-12-20 ENCOUNTER — Other Ambulatory Visit: Payer: Self-pay

## 2019-12-20 DIAGNOSIS — R609 Edema, unspecified: Secondary | ICD-10-CM | POA: Insufficient documentation

## 2019-12-20 LAB — RENAL FUNCTION PANEL
Albumin: 2.5 g/dL — ABNORMAL LOW (ref 3.5–5.0)
Anion gap: 13 (ref 5–15)
BUN: 53 mg/dL — ABNORMAL HIGH (ref 8–23)
CO2: 25 mmol/L (ref 22–32)
Calcium: 8.5 mg/dL — ABNORMAL LOW (ref 8.9–10.3)
Chloride: 99 mmol/L (ref 98–111)
Creatinine, Ser: 1.9 mg/dL — ABNORMAL HIGH (ref 0.61–1.24)
GFR, Estimated: 39 mL/min — ABNORMAL LOW (ref >60)
Glucose, Bld: 97 mg/dL (ref 70–99)
Phosphorus: 4.3 mg/dL (ref 2.5–4.6)
Potassium: 3.4 mmol/L — ABNORMAL LOW (ref 3.5–5.1)
Sodium: 137 mmol/L (ref 135–145)

## 2019-12-20 MED ORDER — FUROSEMIDE 10 MG/ML IJ SOLN: 80.0000 mg | Freq: Once | INTRAMUSCULAR | Status: AC

## 2019-12-20 MED ORDER — POTASSIUM CHLORIDE CRYS ER 20 MEQ PO TBCR: 40.0000 meq | EXTENDED_RELEASE_TABLET | Freq: Once | ORAL | Status: AC

## 2019-12-20 MED ORDER — POTASSIUM CHLORIDE CRYS ER 20 MEQ PO TBCR
EXTENDED_RELEASE_TABLET | ORAL | Status: AC
  Administered 2019-12-20: 09:00:00 40 meq via ORAL
  Filled 2019-12-20: qty 2

## 2019-12-20 MED ORDER — FUROSEMIDE 10 MG/ML IJ SOLN
INTRAMUSCULAR | Status: AC
  Administered 2019-12-20: 08:00:00 80 mg via INTRAVENOUS
  Filled 2019-12-20: qty 8

## 2019-12-25 DIAGNOSIS — N1832 Chronic kidney disease, stage 3b: Secondary | ICD-10-CM | POA: Diagnosis not present

## 2019-12-25 DIAGNOSIS — N390 Urinary tract infection, site not specified: Secondary | ICD-10-CM | POA: Diagnosis not present

## 2019-12-29 NOTE — Progress Notes (Deleted)
Cardiology Office Note  Date:  12/29/2019   ID:  STEVENSON Koch, DOB 05/04/1955, MRN [id number]  PCP:  Tonia Ghent, MD   No chief complaint on file.   HPI:  [name] is a 64 year old male with past medical history of CAD,  CABG with LIMA to the  LAD, SVG to OM2/OM3, SVG to diag Diabetes, poorly controlled , 9.0 Smoker HTN Depression hyperlipidemia Referred by Dr. Damita Dunnings for consultation of his CAD, hx of CABG  New to our office, he reports that he has no chest pain concerning for angina Long discussion concerning his prior cardiac history  Stress test 03/2008 EF 45% Fixed defect involving the inferior wall most consistent with scar versus hibernating myocardium.  Stress test March 2012 ? 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  PSH:    Past Surgical History:  Procedure Laterality Date  . CATARACT EXTRACTION Left 09/2015  . CHEST TUBE INSERTION Left 10/01/2018   Procedure: INSERTION PLEURAL DRAINAGE CATHETER;  Surgeon: [name], MD;  Location: [facility] ORS;  Service: Thoracic;  Laterality: Left;  .  CORONARY ARTERY BYPASS GRAFT  2004   (CABG with LIMA to the  LAD, SVG to OM2/OM3, SVG  to diag  . DIALYSIS/PERMA CATHETER INSERTION N/A 03/14/2019   Procedure: DIALYSIS/PERMA CATHETER INSERTION;  Surgeon: Algernon Huxley, MD;  Location: [place] [facility];  Service: Cardiovascular;  Laterality: N/A;  . DIALYSIS/PERMA CATHETER REMOVAL N/A 05/28/2019   Procedure: DIALYSIS/PERMA CATHETER REMOVAL;  Surgeon: Katha Cabal, MD;  Location: [place] [facility];  Service: Cardiovascular;  Laterality: N/A;  . Left ankle surgery     repair of fracture  . Right lower leg surgery     rod  . TEMPORARY DIALYSIS CATHETER N/A 03/11/2019   Procedure: TEMPORARY DIALYSIS CATHETER;  Surgeon: Algernon Huxley, MD;  Location: [place] [facility];  Service: Cardiovascular;  Laterality: N/A;    Current Outpatient Medications  Medication Sig Dispense Refill  . torsemide (DEMADEX) 20 MG tablet Take by mouth.    [name] albuterol (VENTOLIN HFA) 108 (90 Base) MCG/ACT inhaler Inhale 2 puffs into the lungs every 6 (six) hours as needed for wheezing or shortness of breath.    [name] amiodarone (PACERONE) 200 MG tablet Take 1 tablet (200 mg total) by mouth daily. 90 tablet 3  . apixaban (ELIQUIS) 5 MG TABS tablet Take 1 tablet (5 mg total) by mouth 2 (two) times daily. 60 tablet 5  . DULoxetine (CYMBALTA) 30 MG capsule TAKE 1 CAPSULE BY MOUTH EVERY DAY 90 capsule 1  . ezetimibe (ZETIA) 10 MG tablet Take 1 tablet (10 mg total) by mouth daily. 30 tablet 5  .  ferrous sulfate 325 (65 FE) MG tablet TAKE 1 TABLET (325 MG TOTAL) BY MOUTH 2 (TWO) TIMES DAILY WITH A MEAL. 742 tablet 0  . folic acid (FOLVITE) 595 MCG tablet Take 400 mcg by mouth daily.    . insulin detemir (LEVEMIR) 100 UNIT/ML injection Inject 0.04-0.05 mLs (4-5 Units total) into the skin at bedtime. if glucose over 190 mg/dl    . insulin lispro (HUMALOG) 100 UNIT/ML injection Inject 10-15 Units into the skin 3 (three) times daily. Patient takes 10 unit TID with meals plus  additional units if needed based on glucose 20 mL 2  . ipratropium-albuterol (DUONEB) 0.5-2.5 (3) MG/3ML SOLN Take 3 mLs by nebulization 4 (four) times daily.    . metoprolol succinate (TOPROL-XL) 25 MG 24 hr tablet TAKE 1/2 TABLET BY MOUTH EVERY DAY 45 tablet 1  . multivitamin (RENA-VIT) TABS tablet Take 1 tablet by mouth at bedtime.  0  . potassium chloride (KLOR-CON) 10 MEQ tablet TAKE 1 TABLET BY MOUTH EVERY DAY 30 tablet 6  . promethazine (PHENERGAN) 25 MG tablet Take 25 mg by mouth every 6 (six) hours as needed for nausea or vomiting.    . rosuvastatin (CRESTOR) 10 MG tablet TAKE 1 TABLET (10 MG TOTAL) BY MOUTH DAILY. 90 tablet 3  . spironolactone (ALDACTONE) 25 MG tablet Take 1 tablet (25 mg total) by mouth daily. 30 tablet 1  . torsemide (DEMADEX) 20 MG tablet Take 4 tablets (80 mg total) by mouth daily. 120 tablet 1   No current facility-administered medications for this visit.    Allergies:   Promethazine, Trazodone and nefazodone, and Lipitor [atorvastatin calcium]   Social History:  The patient  reports that he has been smoking cigarettes. He has a 9.00 pack-year smoking history. He has quit using smokeless tobacco.  His smokeless tobacco use included snuff. He reports previous alcohol use of about 6.0 standard drinks of alcohol per week. He reports that he does not use drugs.   Family History:   family history includes Dementia in his mother; Heart disease in his father.    Review of Systems: Review of Systems  Constitutional: Negative.   Respiratory: Negative.   Cardiovascular: Negative.   Gastrointestinal: Negative.   Musculoskeletal: Negative.   Neurological: Negative.   Psychiatric/Behavioral: Negative.   All other systems reviewed and are negative.   PHYSICAL EXAM: VS:  There were no vitals taken for this visit. , BMI There is no height or weight on file to calculate BMI. GEN: Well nourished, well developed, in no acute distress  HEENT: normal  Neck: no JVD,  carotid bruits, or masses Cardiac: RRR; no murmurs, rubs, or gallops,no edema  Respiratory:  clear to auscultation bilaterally, normal work of breathing GI: soft, nontender, nondistended, + BS MS: no deformity or atrophy  Skin: warm and dry, no rash Neuro:  Strength and sensation are intact Psych: euthymic mood, full affect   Recent Labs: 08/05/2019: TSH 4.742 08/15/2019: Pro B Natriuretic peptide (BNP) 1,476.0 10/21/2019: B Natriuretic Peptide 2,099.0 10/24/2019: Magnesium 2.0 11/01/2019: Hemoglobin 10.1; Platelets 280.0 11/11/2019: ALT 24 12/20/2019: BUN 53; Creatinine, Ser 1.90; Potassium 3.4; Sodium 137    Lipid Panel Lab Results  Component Value Date   CHOL 85 (L) 05/24/2019   HDL 41 05/24/2019   LDLCALC 32 05/24/2019   TRIG 47 05/24/2019      Wt Readings from Last 3 Encounters:  11/25/19 198 lb 6.4 oz (90 kg)  11/06/19 205 lb (93 kg)  11/01/19 205 lb 9 oz (  93.2 kg)      ASSESSMENT AND PLAN:  Coronary artery disease of native artery of native heart with stable angina pectoris ([place]) - Plan: EKG 12-Lead Currently with no symptoms of angina. No further workup at this time. Continue current medication regimen. Long discussion concerning anginal symptoms to watch for No testing has been ordered at this time Stressed the importance of diabetes control  Diabetes mellitus with complication ([place]) - Plan: EKG 12-Lead Hemoglobin A1c discussed with him, 9 Recommended a low carbohydrate diet.Discussed with him in detail Dietary guide provided  Mixed hyperlipidemia Ideally goal LDL should be 60 her last He does not want to increase the Crestor at this time Prefers to do it through low carbohydrate diet and weight loss  Smoker We have encouraged him to continue to work on weaning his cigarettes and smoking cessation. He will continue to work on this and does not want any assistance with chantix. We did provide him with coupon for Chantix if he changes his mind  Essential  hypertension Blood pressure is well controlled on today's visit. No changes made to the medications.  Disposition:   F/U  12 months   extensive discussion with patient and review of records  Total encounter time more than 60 minutes  Greater than 50% was spent in counseling and coordination of care with the patient    No orders of the defined types were placed in this encounter.    Signed, Esmond Plants, M.D., Ph.D. 12/29/2019  [place], [place]

## 2019-12-30 ENCOUNTER — Ambulatory Visit: Payer: Medicare Other | Admitting: Cardiovascular Disease

## 2019-12-30 ENCOUNTER — Encounter: Payer: Self-pay | Admitting: Cardiovascular Disease

## 2019-12-30 ENCOUNTER — Ambulatory Visit (INDEPENDENT_AMBULATORY_CARE_PROVIDER_SITE_OTHER): Payer: Medicare Other | Admitting: Cardiovascular Disease

## 2019-12-30 ENCOUNTER — Ambulatory Visit
Admission: RE | Admit: 2019-12-30 | Discharge: 2019-12-30 | Disposition: A | Discharge status: Home / Self Care | Payer: Medicare Other | Source: Ambulatory Visit | Attending: Nephrology | Admitting: Nephrology

## 2019-12-30 ENCOUNTER — Other Ambulatory Visit: Payer: Self-pay

## 2019-12-30 VITALS — BP 120/62 | HR 77 | Ht 72.0 in | Wt 215.0 lb

## 2019-12-30 DIAGNOSIS — I480 Paroxysmal atrial fibrillation: Secondary | ICD-10-CM

## 2019-12-30 DIAGNOSIS — I10 Essential (primary) hypertension: Secondary | ICD-10-CM

## 2019-12-30 DIAGNOSIS — I5043 Acute on chronic combined systolic (congestive) and diastolic (congestive) heart failure: Secondary | ICD-10-CM | POA: Diagnosis not present

## 2019-12-30 DIAGNOSIS — I25118 Atherosclerotic heart disease of native coronary artery with other forms of angina pectoris: Secondary | ICD-10-CM

## 2019-12-30 DIAGNOSIS — I5022 Chronic systolic (congestive) heart failure: Secondary | ICD-10-CM | POA: Diagnosis not present

## 2019-12-30 MED ORDER — FUROSEMIDE 10 MG/ML IJ SOLN: 80.0000 mg | Freq: Once | INTRAMUSCULAR | Status: AC

## 2019-12-30 MED ORDER — TORSEMIDE 20 MG PO TABS: ORAL_TABLET | ORAL | 5 refills | Status: AC

## 2019-12-30 MED ORDER — SODIUM CHLORIDE FLUSH 0.9 % IV SOLN
INTRAVENOUS | Status: AC
  Filled 2019-12-30: qty 10

## 2019-12-30 MED ORDER — FUROSEMIDE 10 MG/ML IJ SOLN
INTRAMUSCULAR | Status: AC
  Administered 2019-12-30: 13:00:00 80 mg via INTRAVENOUS
  Filled 2019-12-30: qty 8

## 2019-12-30 NOTE — Patient Instructions (Signed)
BMP today   Medication Instructions:  Please add extra torsemide 40 mg in the PM Stay on torsemide 80 in the Am  Cut back on fluids   If you need a refill on your cardiac medications before your next appointment, please call your pharmacy.    Lab work: [employer] today   If you have labs (blood work) drawn today and your tests are completely normal, you will receive your results only by: [name] MyChart Message (if you have MyChart) OR . A paper copy in the mail If you have any lab test that is abnormal or we need to change your treatment, we will call you to review the results.   Testing/Procedures: No new testing needed   Follow-Up: At [facility], you and your health needs are our priority.  As part of our continuing mission to provide you with exceptional heart care, we have created designated Provider Care Teams.  These Care Teams include your primary Cardiologist (physician) and Advanced Practice Providers (APPs -  Physician Assistants and Nurse Practitioners) who all work together to provide you with the care you need, when you need it.  . You will need a follow up appointment in 1 month  . Providers on your designated Care Team:   . Murray Hodgkins, NP . Christell Faith, PA-C . Marrianne Mood, PA-C  Any Other Special Instructions Will Be Listed Below (If Applicable).  COVID-19 Vaccine Information can be found at: ShippingScam.co.uk For questions related to vaccine distribution or appointments, please email [email or web address] .[email or web address] or call 6575309998.

## 2019-12-30 NOTE — Progress Notes (Signed)
Cardiology Office Note  Date:  12/30/2019   ID:  Craig Koch, DOB 07/24/1955, MRN [id number]  PCP:  Tonia Ghent, MD   Chief Complaint  Patient presents with  . Follow-up    Follow up and medications verbally reviewed with patient and spouse.    HPI:  Craig Koch is a 64 year old male with past medical history of CAD,  CABG with LIMA to the  LAD, SVG to OM2/OM3, SVG to diag Diabetes, poorly controlled , 9.0 Smoker HTN Depression Hyperlipidemia Ejection fraction 35 to 40% in March 2021 Who presents for routine follow-up of his CAD, hx of CABG  Smoker half pack per day  Last seen in clinic September 2019 by myself Recently seen by one of our providers October 17, 2019 Numerous issues to discuss since I last seen him 2 years ago  Admitted to [facility] August 2020 with large pleural effusion underwent thoracentesis.  Cytology positive for adenocarcinoma.  [facility] 02/23/2019 found to be in new onset atrial fibrillation with RVR,  AKI requiring temporary HD, NSTEMI, new HFrEF.   Started on amiodarone. Elevated troponin suspicion for graft occlusion.   Echo with moderately reduced LVEF.   Catheterization deferred.  Temporary dialysis catheter placed and removed by the end of admission with tunneled right IJ HD catheter placed.  Since that time HD has been discontinued and HD catheter removed.     clinic 07/31/2019 his lower extremity edema   Lasix has been increased to 40 mg daily.   ED visit 10/04/2019 for nosebleed in setting of Plavix and Eliquis use.  Echocardiogram March 2021 EF 35 to 40% Hypokinesis inferior wall Mild to moderately elevated right heart pressures  Hemoglobin A1c 8.1  In follow-up today he presents with family Therefore worsening leg swelling, weight gain Review of weight, he is up 10 pounds from 2 months ago Legs have pitting edema into the thighs, with ankle edema and shortness of breath He reports he is set up for IV Lasix 3 times this  week and next week Takes torsemide 80 mg in the morning  More confusion recently, On discussion of his leg swelling, reports this has been ongoing for more than 1 year  Drinks a lot of fluids Poor diet at times  EKG personally reviewed by myself on todays visit Normal sinus rhythm rate 77 bpm left bundle branch block/conduction delay, unable to exclude old anterior MI   PMH:   has a past medical history of Anxiety, Arthritis, CHF (congestive heart failure) ([place]), Coronary artery disease, Diabetes mellitus, Dyslipidemia, CABG, Hyperlipidemia, Hypertension, Malignant neoplasm of unspecified part of unspecified bronchus or lung ([place]) (08/2018), and Pleural effusion.  PSH:    Past Surgical History:  Procedure Laterality Date  . CATARACT EXTRACTION Left 09/2015  . CHEST TUBE INSERTION Left 10/01/2018   Procedure: INSERTION PLEURAL DRAINAGE CATHETER;  Surgeon: [name], MD;  Location: [facility] ORS;  Service: Thoracic;  Laterality: Left;  . CORONARY ARTERY BYPASS GRAFT  2004   (CABG with LIMA to the  LAD, SVG to OM2/OM3, SVG  to diag  . DIALYSIS/PERMA CATHETER INSERTION N/A 03/14/2019   Procedure: DIALYSIS/PERMA CATHETER INSERTION;  Surgeon: Algernon Huxley, MD;  Location: [place] [facility];  Service: Cardiovascular;  Laterality: N/A;  . DIALYSIS/PERMA CATHETER REMOVAL N/A 05/28/2019   Procedure: DIALYSIS/PERMA CATHETER REMOVAL;  Surgeon: Katha Cabal, MD;  Location: [place] [facility];  Service: Cardiovascular;  Laterality: N/A;  . Left ankle surgery     repair of fracture  .  Right lower leg surgery     rod  . TEMPORARY DIALYSIS CATHETER N/A 03/11/2019   Procedure: TEMPORARY DIALYSIS CATHETER;  Surgeon: Algernon Huxley, MD;  Location: [place] [facility];  Service: Cardiovascular;  Laterality: N/A;    Current Outpatient Medications  Medication Sig Dispense Refill  . albuterol (VENTOLIN HFA) 108 (90 Base) MCG/ACT inhaler Inhale 2 puffs into the lungs every 6 (six) hours as needed for  wheezing or shortness of breath.    [name] amiodarone (PACERONE) 200 MG tablet Take 1 tablet (200 mg total) by mouth daily. 90 tablet 3  . amoxicillin-clavulanate (AUGMENTIN) 875-125 MG tablet Take 1 tablet by mouth 2 (two) times daily.    [name] apixaban (ELIQUIS) 5 MG TABS tablet Take 1 tablet (5 mg total) by mouth 2 (two) times daily. 60 tablet 5  . DULoxetine (CYMBALTA) 30 MG capsule TAKE 1 CAPSULE BY MOUTH EVERY DAY 90 capsule 1  . folic acid (FOLVITE) 829 MCG tablet Take 400 mcg by mouth daily.    . insulin detemir (LEVEMIR) 100 UNIT/ML injection Inject 0.04-0.05 mLs (4-5 Units total) into the skin at bedtime. if glucose over 190 mg/dl    . insulin lispro (HUMALOG) 100 UNIT/ML injection Inject 10-15 Units into the skin 3 (three) times daily. Patient takes 10 unit TID with meals plus additional units if needed based on glucose 20 mL 2  . ipratropium-albuterol (DUONEB) 0.5-2.5 (3) MG/3ML SOLN Take 3 mLs by nebulization 4 (four) times daily.    . metoprolol succinate (TOPROL-XL) 25 MG 24 hr tablet TAKE 1/2 TABLET BY MOUTH EVERY DAY 45 tablet 1  . multivitamin (RENA-VIT) TABS tablet Take 1 tablet by mouth at bedtime.  0  . potassium chloride (KLOR-CON) 10 MEQ tablet TAKE 1 TABLET BY MOUTH EVERY DAY 30 tablet 6  . promethazine (PHENERGAN) 25 MG tablet Take 25 mg by mouth every 6 (six) hours as needed for nausea or vomiting.    . rosuvastatin (CRESTOR) 10 MG tablet TAKE 1 TABLET (10 MG TOTAL) BY MOUTH DAILY. 90 tablet 3  . spironolactone (ALDACTONE) 25 MG tablet Take 1 tablet (25 mg total) by mouth daily. 30 tablet 1  . ferrous sulfate 325 (65 FE) MG tablet TAKE 1 TABLET (325 MG TOTAL) BY MOUTH 2 (TWO) TIMES DAILY WITH A MEAL. 180 tablet 0  . torsemide (DEMADEX) 20 MG tablet Take 4 tablets (80mg ) every AM and 2 tablets (40mg ) every PM 180 tablet 5   No current facility-administered medications for this visit.   Facility-Administered Medications Ordered in Other Visits  Medication Dose Route Frequency  Provider Last Rate Last Admin  . sodium chloride flush 0.9 % injection             Allergies:   Promethazine, Trazodone and nefazodone, and Lipitor [atorvastatin calcium]   Social History:  The patient  reports that he has been smoking cigarettes. He has a 9.00 pack-year smoking history. He has quit using smokeless tobacco.  His smokeless tobacco use included snuff. He reports previous alcohol use of about 6.0 standard drinks of alcohol per week. He reports that he does not use drugs.   Family History:   family history includes Dementia in his mother; Heart disease in his father.    Review of Systems: Review of Systems  Constitutional: Negative.   Respiratory: Negative.   Cardiovascular: Negative.   Gastrointestinal: Negative.   Musculoskeletal: Negative.   Neurological: Negative.   Psychiatric/Behavioral: Negative.   All other systems reviewed and are negative.  PHYSICAL EXAM: VS:  BP 120/62 (BP Location: Right Arm, Patient Position: Sitting, Cuff Size: Normal)   Pulse 77   Ht 6' (1.829 m)   Wt 215 lb (97.5 kg)   BMI 29.16 kg/m  , BMI Body mass index is 29.16 kg/m. GEN: Well nourished, well developed, in no acute distress  HEENT: normal  Neck: + JVD, carotid bruits, or masses Cardiac: RRR; no murmurs, rubs, or gallops,2+ edema to the thighs Respiratory: Dullness at the bases bilaterally GI: soft, nontender, nondistended, + BS MS: no deformity or atrophy  Skin: warm and dry, no rash Neuro:  Strength and sensation are intact Psych: euthymic mood, full affect   Recent Labs: 08/05/2019: TSH 4.742 08/15/2019: Pro B Natriuretic peptide (BNP) 1,476.0 10/21/2019: B Natriuretic Peptide 2,099.0 10/24/2019: Magnesium 2.0 11/01/2019: Hemoglobin 10.1; Platelets 280.0 11/11/2019: ALT 24 12/20/2019: BUN 53; Creatinine, Ser 1.90; Potassium 3.4; Sodium 137    Lipid Panel Lab Results  Component Value Date   CHOL 85 (L) 05/24/2019   HDL 41 05/24/2019   LDLCALC 32 05/24/2019    TRIG 47 05/24/2019      Wt Readings from Last 3 Encounters:  12/30/19 215 lb (97.5 kg)  11/25/19 198 lb 6.4 oz (90 kg)  11/06/19 205 lb (93 kg)      ASSESSMENT AND PLAN:  Coronary artery disease of native artery of native heart with stable angina pectoris ([place]) - Plan: EKG 12-Lead Ejection fraction 35 to 40% March 2021 Cardiac catheterization deferred in the setting of renal failure Denies anginal symptoms No recent stress test We will discuss ischemic work-up with him in follow-up  Diabetes mellitus with complication ([place]) - Plan: EKG 12-Lead Followed by primary care  Mixed hyperlipidemia Lipids at goal  Smoker Smoking cessation recommended  Essential hypertension Blood pressure is well controlled on today's visit. No changes made to the medications.  Acute chronic diastolic CHF Weight is markedly high High fluid intake, recommended he drastically cut back on his fluid intake.  This was discussed with family -Scheduled for IV Lasix 3 times this week and next week -Has close follow-up with nephrology in 2 weeks time -We will recommend torsemide 80 in the morning, 40 in the afternoon Suspect around 15 to 20 pounds above his dry weight Recommend family call me in 1 week with his daily weights -Suspect he may need metolazone 2.5 to take twice a week if no improvement in his weight with higher dose torsemide as above    Total encounter time more than 45 minutes  Greater than 50% was spent in counseling and coordination of care with the patient    Orders Placed This Encounter  Procedures  . Basic metabolic panel  . EKG 12-Lead     Signed, Esmond Plants, M.D., Ph.D. 12/30/2019  [place], [place]

## 2019-12-31 LAB — BASIC METABOLIC PANEL
BUN/Creatinine Ratio: 30 — ABNORMAL HIGH (ref 10–24)
BUN: 73 mg/dL — ABNORMAL HIGH (ref 8–27)
CO2: 22 mmol/L (ref 20–29)
Calcium: 8.7 mg/dL (ref 8.6–10.2)
Chloride: 96 mmol/L (ref 96–106)
Creatinine, Ser: 2.43 mg/dL — ABNORMAL HIGH (ref 0.76–1.27)
GFR calc Af Amer: 31 mL/min/{1.73_m2} — ABNORMAL LOW (ref >59)
GFR calc non Af Amer: 27 mL/min/{1.73_m2} — ABNORMAL LOW (ref >59)
Glucose: 131 mg/dL — ABNORMAL HIGH (ref 65–99)
Potassium: 4.1 mmol/L (ref 3.5–5.2)
Sodium: 136 mmol/L (ref 134–144)

## 2019-12-31 NOTE — Addendum Note (Signed)
Encounter addended by: Kathyrn Drown, RN on: 12/31/2019 2:42 PM  Actions taken: Charge Capture section accepted

## 2020-01-01 ENCOUNTER — Ambulatory Visit
Admission: RE | Admit: 2020-01-01 | Discharge: 2020-01-01 | Disposition: A | Discharge status: Home / Self Care | Payer: Medicare Other | Source: Ambulatory Visit | Attending: Nephrology | Admitting: Nephrology

## 2020-01-01 ENCOUNTER — Other Ambulatory Visit: Payer: Self-pay

## 2020-01-01 DIAGNOSIS — R600 Localized edema: Secondary | ICD-10-CM | POA: Insufficient documentation

## 2020-01-01 MED ORDER — FUROSEMIDE 10 MG/ML IJ SOLN
INTRAMUSCULAR | Status: AC
  Administered 2020-01-01: 13:00:00 80 mg via INTRAVENOUS
  Filled 2020-01-01: qty 8

## 2020-01-01 MED ORDER — FUROSEMIDE 10 MG/ML IJ SOLN: 80.0000 mg | Freq: Once | INTRAMUSCULAR | Status: AC

## 2020-01-02 ENCOUNTER — Ambulatory Visit
Admission: RE | Admit: 2020-01-02 | Discharge: 2020-01-02 | Disposition: A | Discharge status: Home / Self Care | Payer: Medicare Other | Source: Ambulatory Visit | Attending: Nephrology | Admitting: Nephrology

## 2020-01-02 DIAGNOSIS — R600 Localized edema: Secondary | ICD-10-CM | POA: Insufficient documentation

## 2020-01-02 DIAGNOSIS — N189 Chronic kidney disease, unspecified: Secondary | ICD-10-CM | POA: Diagnosis not present

## 2020-01-02 LAB — RENAL FUNCTION PANEL
Albumin: 2.4 g/dL — ABNORMAL LOW (ref 3.5–5.0)
Anion gap: 14 (ref 5–15)
BUN: 80 mg/dL — ABNORMAL HIGH (ref 8–23)
CO2: 24 mmol/L (ref 22–32)
Calcium: 8.3 mg/dL — ABNORMAL LOW (ref 8.9–10.3)
Chloride: 96 mmol/L — ABNORMAL LOW (ref 98–111)
Creatinine, Ser: 2.76 mg/dL — ABNORMAL HIGH (ref 0.61–1.24)
GFR, Estimated: 25 mL/min — ABNORMAL LOW (ref >60)
Glucose, Bld: 194 mg/dL — ABNORMAL HIGH (ref 70–99)
Phosphorus: 6.1 mg/dL — ABNORMAL HIGH (ref 2.5–4.6)
Potassium: 3.9 mmol/L (ref 3.5–5.1)
Sodium: 134 mmol/L — ABNORMAL LOW (ref 135–145)

## 2020-01-02 MED ORDER — FUROSEMIDE 10 MG/ML IJ SOLN: 80.0000 mg | Freq: Once | INTRAMUSCULAR | Status: AC

## 2020-01-02 MED ORDER — FUROSEMIDE 10 MG/ML IJ SOLN
INTRAMUSCULAR | Status: AC
  Administered 2020-01-02: 13:00:00 80 mg via INTRAVENOUS
  Filled 2020-01-02: qty 8

## 2020-01-03 ENCOUNTER — Ambulatory Visit: Admission: RE | Admit: 2020-01-03 | Payer: Medicare Other | Source: Ambulatory Visit

## 2020-01-05 ENCOUNTER — Telehealth: Payer: Self-pay | Admitting: Family Medicine

## 2020-01-06 ENCOUNTER — Telehealth: Payer: Self-pay

## 2020-01-06 DIAGNOSIS — I480 Paroxysmal atrial fibrillation: Secondary | ICD-10-CM

## 2020-01-06 MED ORDER — APIXABAN 5 MG PO TABS: 5.0000 mg | ORAL_TABLET | Freq: Two times a day (BID) | ORAL | 5 refills | Status: AC

## 2020-01-06 NOTE — Telephone Encounter (Signed)
[place] Night - Client >>>Contains Verbal Order - Signature Required<<< TELEPHONE ADVICE RECORD AccessNurse Patient Name: Craig Koch Gender: Male DOB: April 07, 1955 Age: 65 Y 9 M 17 D Return Phone Number: [phone] (Primary), [phone] (Secondary) Address: [place] [address] Client [facility] Night - Client Client Site [place] Physician Renford Dills - MD Contact Type Call Who Is Calling Patient / Member / Family / Caregiver Call Type Triage / Clinical Caller Name Patty Letizia Relationship To Patient Spouse Return Phone Number [phone] (Primary) Chief Complaint Prescription Refill or Medication Request (non symptomatic) Reason for Call Medication Question / Request Initial Comment Caller states her husband is out of eliquis and has no more refills. Caller is requesting a refill. Translation No Nurse Assessment Nurse: Junius Creamer, RN, Debra Date/Time (Eastern Time): 01/03/2020 4:36:16 PM Please select the assessment type ---Refill Does the patient have enough medication to last until the office opens? ---Unable to obtain loaner dose from Pharmacy Does the client directives allow for assistance with medications after hours? ---Yes Was the medication filled within the last 6 months? ---Yes What is the name of the medication, dose and instructions as listed on the bottle? ---eloquis 5mg  po bid. Name of the physician as listed on the bottle. ---dr vissor from heart [facility]. dr. Damita Dunnings has called this in before. Pharmacy name and phone number where most recently filled. ---[facility] [phone] Nurse: Junius Creamer, RN, Debra Date/Time (Eastern Time): 01/03/2020 4:34:36 PM Confirm and document reason for call. If symptomatic, describe symptoms. ---Caller states husband is out of rx eliquis and has no more refills. no symptoms. tried calling heart dr. but no answer. states dr.  Damita Dunnings has prescribed this for him before. eliquis 5mg  po bid. Does the patient have any new or worsening symptoms? ---No Please document clinical information provided and list any resource used. ---advised per client directives that i can call in a 5 day supply to get him thru until he can contact office. verbalizes understanding. Disp. Time Eilene Ghazi Time) Disposition Final User 01/03/2020 4:46:03 PM Pharmacy Call Junius Creamer, RN, Hilda Blades PLEASE NOTE: All timestamps contained within this report are represented as [place] Standard Time. CONFIDENTIALTY NOTICE: This fax transmission is intended only for the addressee. It contains information that is legally privileged, confidential or otherwise protected from use or disclosure. If you are not the intended recipient, you are strictly prohibited from reviewing, disclosing, copying using or disseminating any of this information or taking any action in reliance on or regarding this information. If you have received this fax in error, please notify us immediately by telephone so that we can arrange for its return to [place]. Phone: [phone], Toll-Free: [phone], Fax: [phone] Page: 2 of 3 Call Id: [id number] [place]. Time Eilene Ghazi Time) Disposition Final User Reason: rx verified. eliquis 5mg  po bid x 5 day supply, no refills. 01/03/2020 4:47:21 PM Clinical Call Yes Junius Creamer, RN, Debra Verbal Orders/Maintenance Medications Medication Refill Route Dosage Regime Duration Admin Instructions User Name eliquis 5mg  po bid x5 day supply no refills. Yes Oral eliquis 5mg  po bid x5 day supply no refills. 5 Days eliquis 5mg  po bid x5 day supply no refills. Junius Creamer, RN, Hilda Blades PLEASE NOTE: All timestamps contained within this report are represented as [place] Standard Time. CONFIDENTIALTY NOTICE: This fax transmission is intended only for the addressee. It contains information that is legally privileged, confidential or otherwise protected from use or disclosure.  If you are not the intended recipient, you  are strictly prohibited from reviewing, disclosing, copying using or disseminating any of this information or taking any action in reliance on or regarding this information. If you have received this fax in error, please notify us immediately by telephone so that we can arrange for its return to [place]. Phone: [phone], Toll-Free: [phone], Fax: [phone] Page: 3 of 3 Call Id: [id number] [place] >>>Contains Verbal Order - Signature Required<<< [address] [place] [address] [phone] Fax: [phone] [place] Night - Client [place] - Night Date: 01/03/2020 From: [facility] Department To: Renford Dills - MD Please sign the order for the approved drug(s) given by our call center nurse on your behalf. Fax to [phone] within 5 business days. Thank you. Date Eilene Ghazi Time): 01/03/2020 12:07:02 PM Triage RN: Sandi Carne, RN NAME: Horton Finer PHONE NUMBER: [phone] (Primary), [phone] (Secondary) BIRTHDATE: [birth year] ADDRESS: CITY/STATE/ZIP: [place] [address] CALLER: Spouse NAME: Patty Ehrler Rx Given Medication Ordering Physician Refill Route Dosage Regime Duration Admin Instructions User Name eliquis 5mg  po bid x5 day supply no refills. Renford Dills - MD Yes Oral eliquis 5mg  po bid x5 day supply no refills. 5 Days eliquis 5mg  po bid x5 day supply no refills. Elyn Aquas MD Signature Date  Pharmacy requests refill on: Apixaban 5 mg   LAST REFILL: 05/10/2019 LAST OV: 11/01/2019 NEXT OV: Not Scheduled  PHARMACY: [facility]

## 2020-01-06 NOTE — Telephone Encounter (Signed)
Faxed pt's recent renal and BMP lab results to pt's establish nephrologist Delorise Jackson as requested by Dr. Rockey Situ

## 2020-01-06 NOTE — Telephone Encounter (Signed)
-----   Message from Minna Merritts, MD sent at 29-Jan-2020  5:00 PM EST ----- Can we forward to his nephrologist Had  fluid overload on clinic visit Suspect cardiorenal

## 2020-01-06 NOTE — Telephone Encounter (Signed)
[place] Night - Client Nonclinical Telephone Record AccessNurse Client [facility] Night - Client Client Site [place] Physician Renford Dills - MD Contact Type Call Who Is Calling Physician / Provider / Hospital Call Type Provider Call [facility] Page Now Reason for Call Request to speak to Physician Initial Comment Paramedic said patient has expired and he is calling about the signing of the death certificate. Patient Name Craig Koch Patient DOB 1955-12-24 Requesting Provider Carlynn Spry Physician Number [phone] Facility Name Paramedic Room Number none Disp. Time Disposition Final User 2020-01-20 10:17:20 PM Send to Mendes, Debra January 20, 2020 10:20:38 PM Paged On Call back to Call Alvan Jan 20, 2020 10:24:55 PM Page Completed Yes [place] Phone DateTime Result/Outcome Message Type Notes Loura Pardon - [place] [phone] 2020/01/20 10:20:38 PM Paged On Call Back to Call Center Doctor Paged This is Bonnita Nasuti with [facility] and we have a page for you. Please call [phone]. Thank you. Loura Pardon - MD 20-Jan-2020 10:24:32 PM Spoke with On Call - General Message Result Call Closed By: Electa Sniff Transaction Date/Time: 2020-01-20 10:15:16 PM (ET)

## 2020-01-06 NOTE — Telephone Encounter (Signed)
Please Advise

## 2020-01-06 NOTE — Telephone Encounter (Signed)
Please have them send me the death certificate.  I'll work on it.   Please find out if they are going to send a paper copy.  Thanks.

## 2020-01-07 ENCOUNTER — Telehealth: Payer: Self-pay | Admitting: *Deleted

## 2020-01-07 NOTE — Telephone Encounter (Signed)
Wife Georga Kaufmann called to say thank you for all you did for Mr Centola and that he passed on 20-Mar-2022.

## 2020-01-07 NOTE — Telephone Encounter (Signed)
Thank you for the update!

## 2020-01-15 ENCOUNTER — Telehealth: Payer: Self-pay | Admitting: Family Medicine

## 2020-01-15 NOTE — Telephone Encounter (Signed)
Called family to offer condolences re: deceased.  LMOVM.

## 2020-01-17 ENCOUNTER — Encounter: Payer: Medicare Other | Admitting: Hospice and Palliative Medicine

## 2020-01-17 ENCOUNTER — Ambulatory Visit: Payer: Medicare Other | Admitting: Oncology

## 2020-01-17 ENCOUNTER — Other Ambulatory Visit: Payer: Medicare Other

## 2020-01-18 DIAGNOSIS — I5043 Acute on chronic combined systolic (congestive) and diastolic (congestive) heart failure: Secondary | ICD-10-CM | POA: Diagnosis not present

## 2020-01-29 ENCOUNTER — Ambulatory Visit: Payer: Medicare Other | Admitting: Cardiovascular Disease

## 2020-02-04 NOTE — Telephone Encounter (Signed)
Call rec from EMS Pt had a witnessed arrest today and passed away at 21:58 Most likely cardiac/had cardiac history   Will pass along to PCP

## 2020-02-04 NOTE — Telephone Encounter (Signed)
Noted.  Thanks.  Please process the chart.

## 2020-02-04 DEATH — deceased

## 2020-06-21 IMAGING — DX CHEST - 2 VIEW
2 series · 2 of 2 positions shown · non-contrast
Comparison: Chest radiographs 08/24/2018, 08/23/2018

CLINICAL DATA: Follow-up pleural effusion.

EXAM:
CHEST - 2 VIEW

[chest pa]
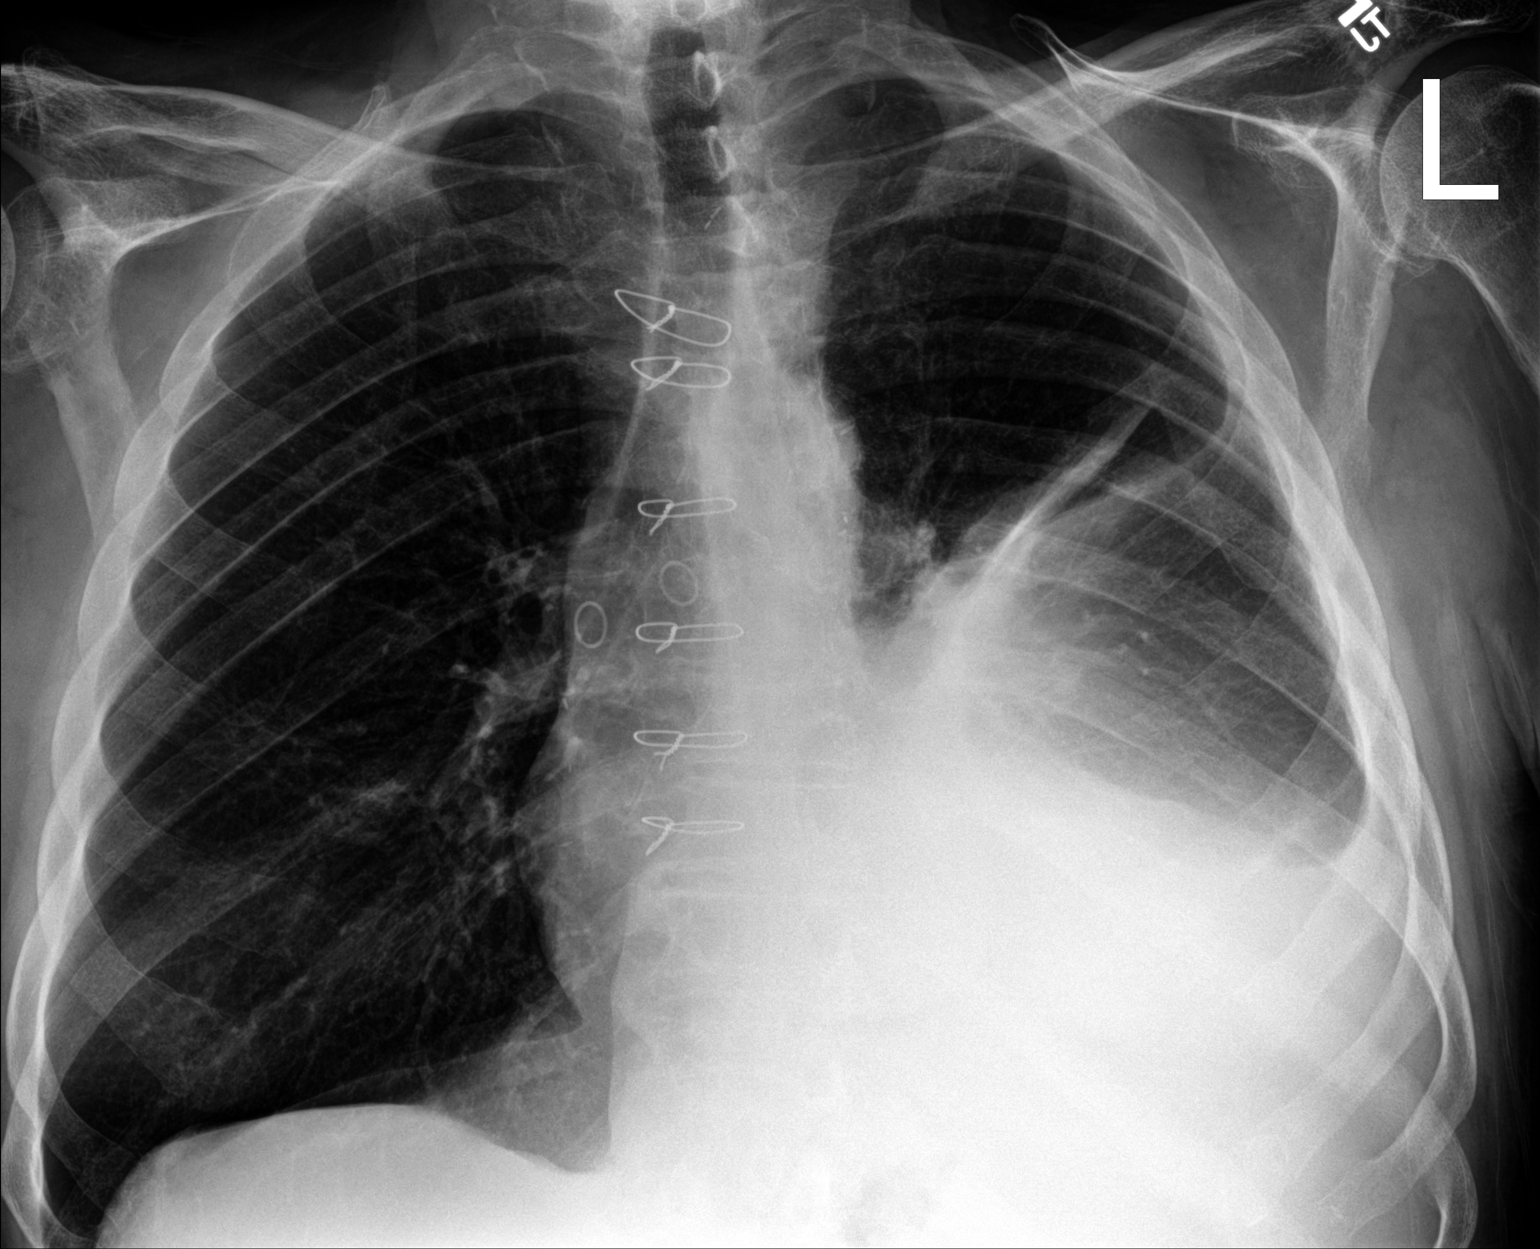

[chest lat]
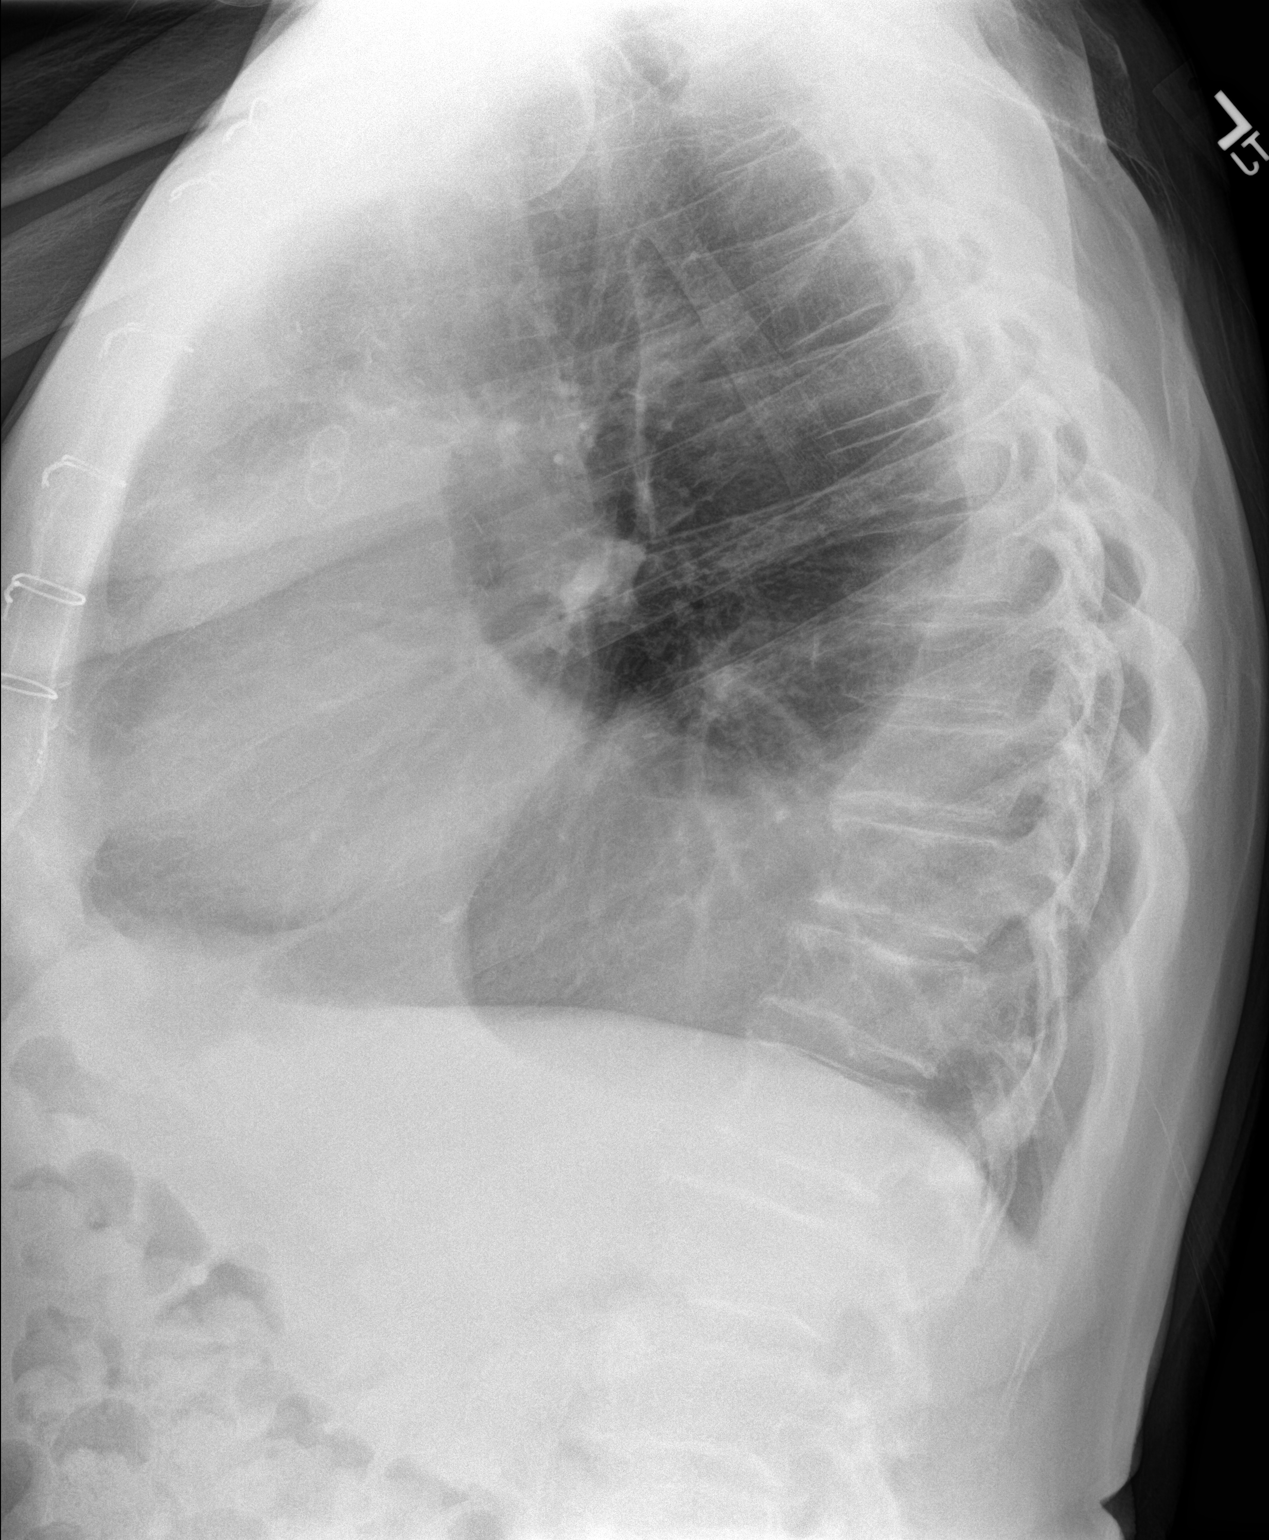

[2 of 2 positions shown; findings below may reference images not displayed]

FINDINGS: Stable cardiomediastinal contours status post median sternotomy and
CABG. The right lung is clear. There are bandlike opacities in the
left mid lung likely representing atelectasis. Persistent moderate
sized left pleural effusion. No pneumothorax. No acute osseous
abnormality in the visualized skeleton.
IMPRESSION: Persistent moderate size left pleural effusion and associated left
lung atelectasis.

## 2020-06-27 IMAGING — DX DG CHEST 1V PORT
2 series · 2 of 2 positions shown · non-contrast
Comparison: 08/30/2018

CLINICAL DATA: Status post left thoracentesis

EXAM:
PORTABLE CHEST 1 VIEW

[chest ap (1 of 2)]
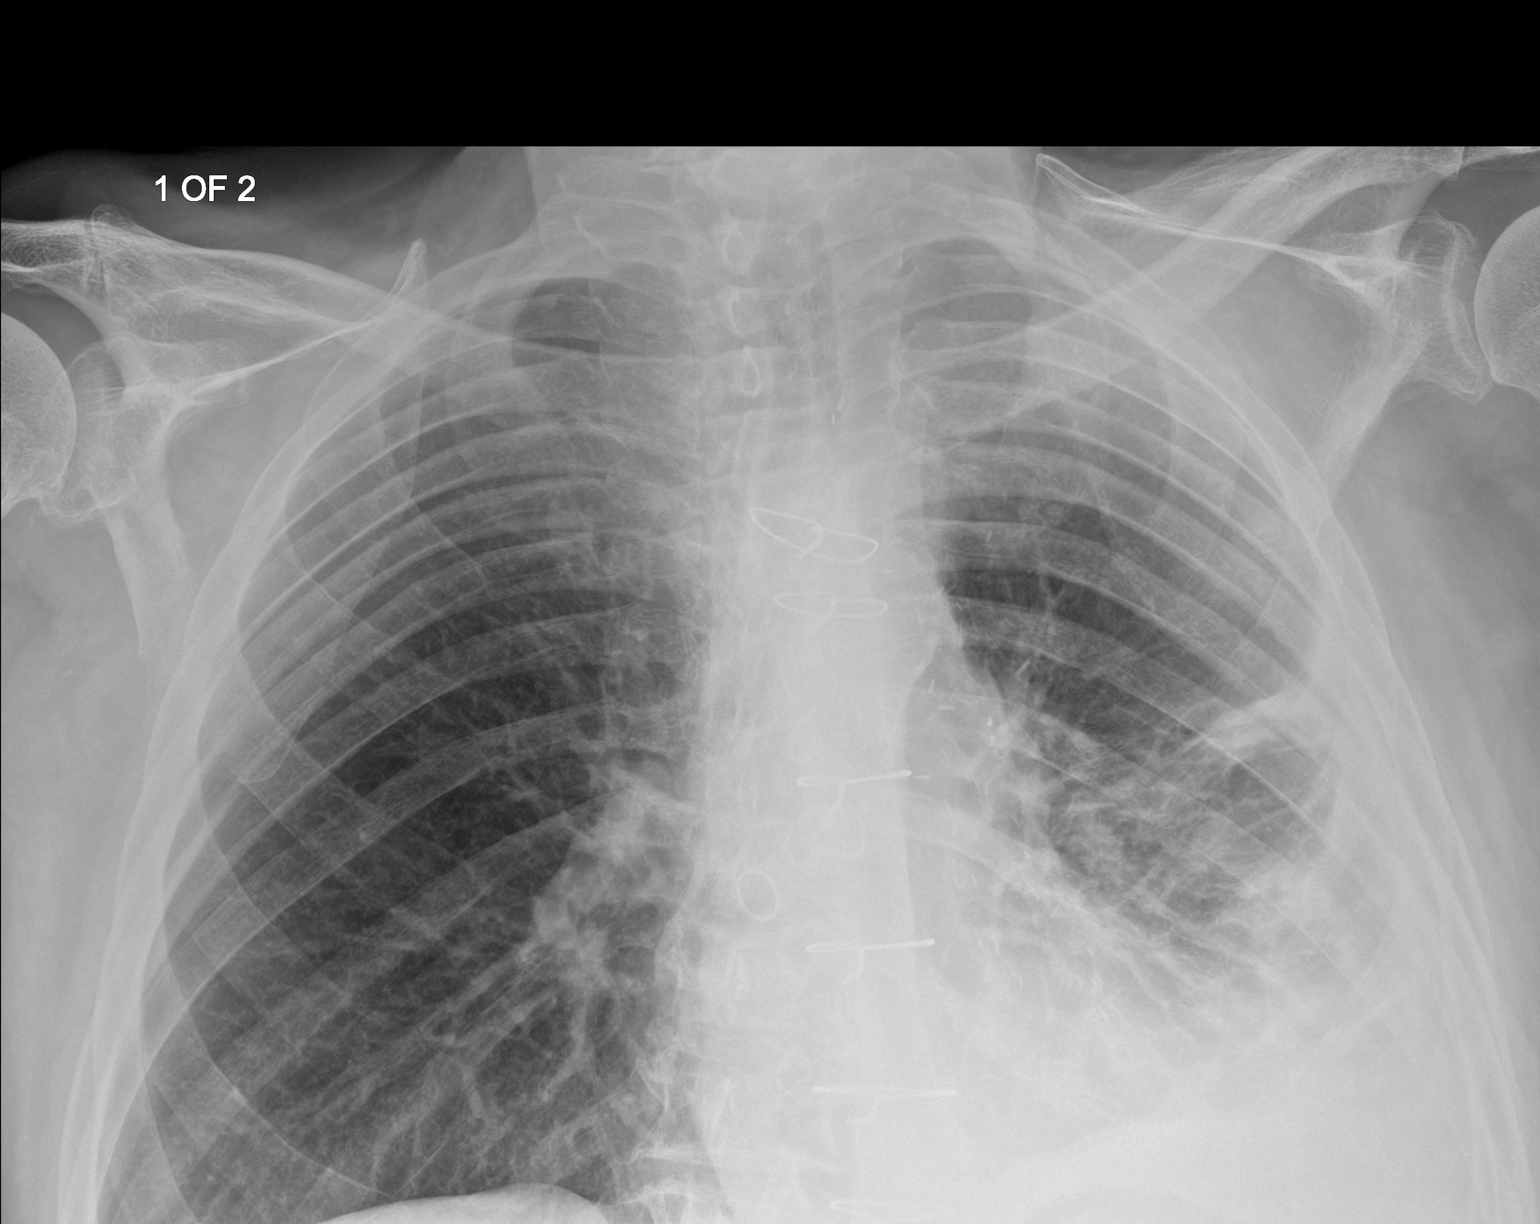

[chest ap (2 of 2)]
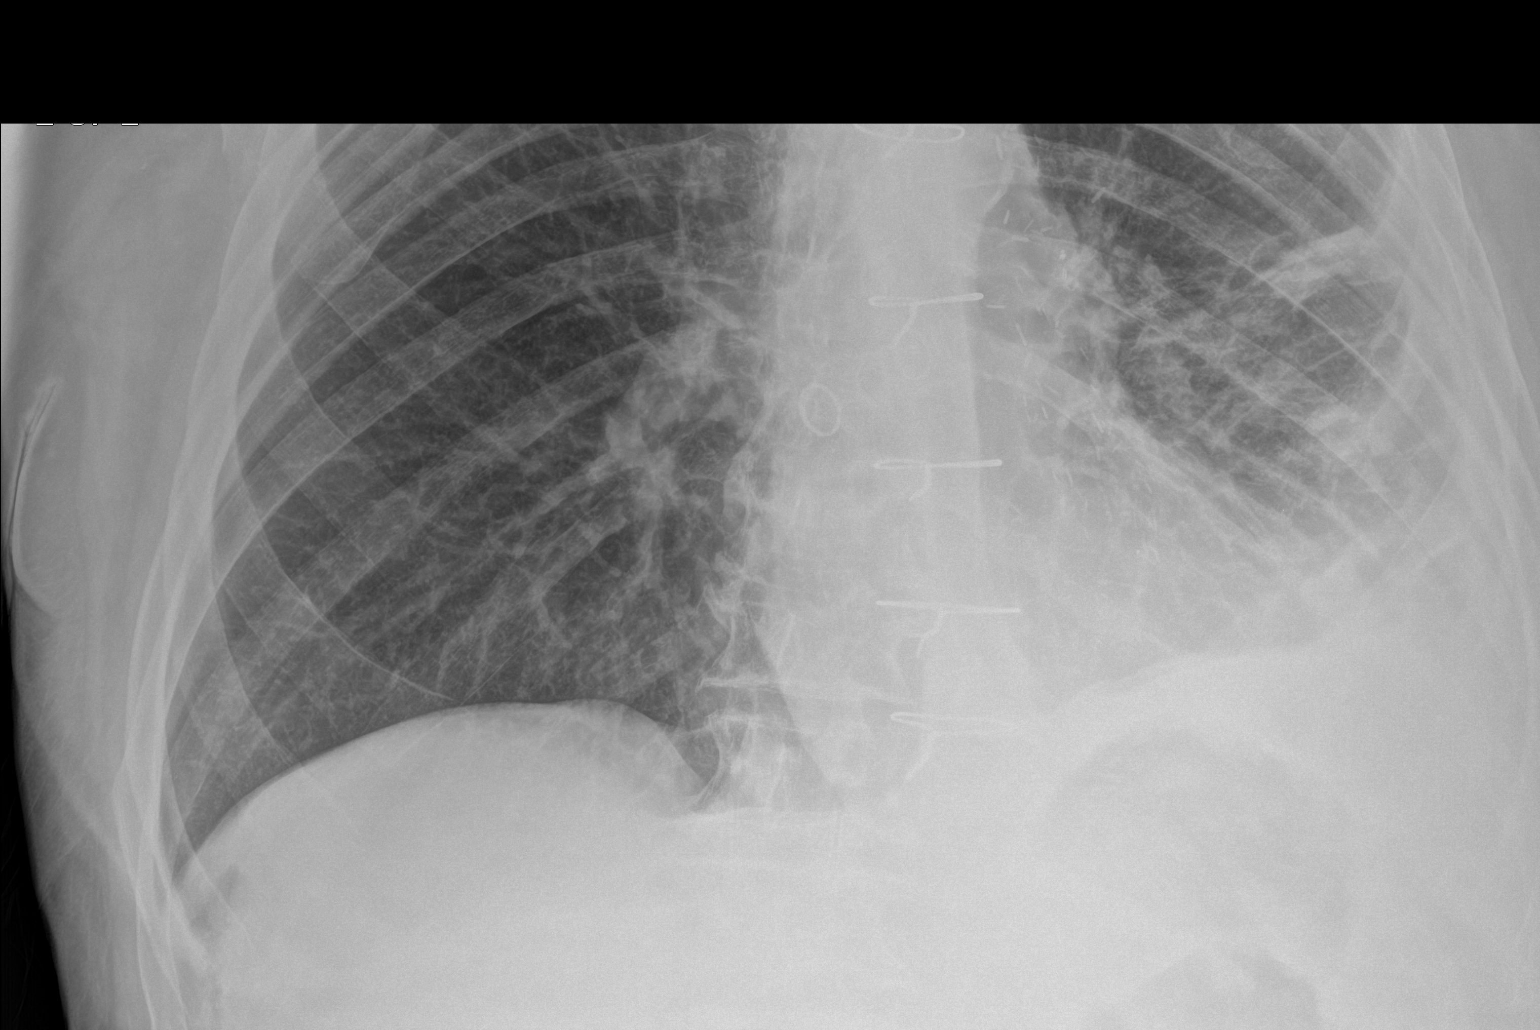

[2 of 2 positions shown; findings below may reference images not displayed]

FINDINGS: Cardiac shadow is stable. Postsurgical changes are again noted.
Persistent scarring is noted in the left lung base with minimal
residual effusion following thoracentesis. No pneumothorax is noted.
No bony abnormality is seen.
IMPRESSION: No pneumothorax following left-sided thoracentesis.

## 2020-08-24 IMAGING — CT CT ANGIO CHEST
2 of 7 series · 13 of 36 positions shown · IV contrast (omnipaque)
Comparison: PET-CT from 09/18/2018

CLINICAL DATA: History of Lung cancer.  Rule out pulmonary embolus.

EXAM:
CT ANGIOGRAPHY CHEST WITH CONTRAST
TECHNIQUE: Multidetector CT imaging of the chest was performed using the
standard protocol during bolus administration of intravenous
contrast. Multiplanar CT image reconstructions and MIPs were
obtained to evaluate the vascular anatomy.
CONTRAST:  75mL OMNIPAQUE IOHEXOL 350 MG/ML SOLN

[Series 6: thins cta pulmonary 1.00 · axial · 0.74mm/px · z∈[-1173,-865]mm · 12 of 365 slices shown]
[im 29/365  lung]
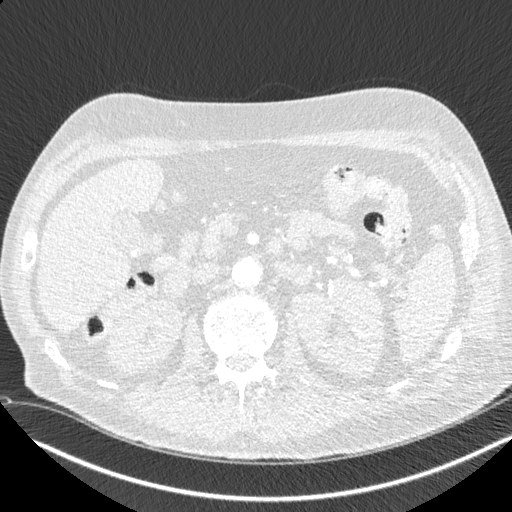
[im 57/365  mediastinal]
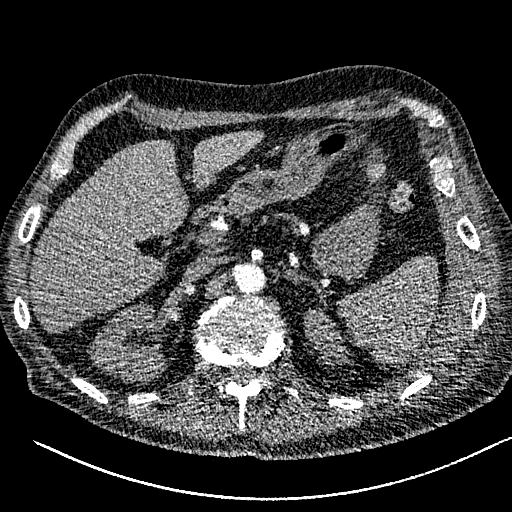
[im 85/365  lung]
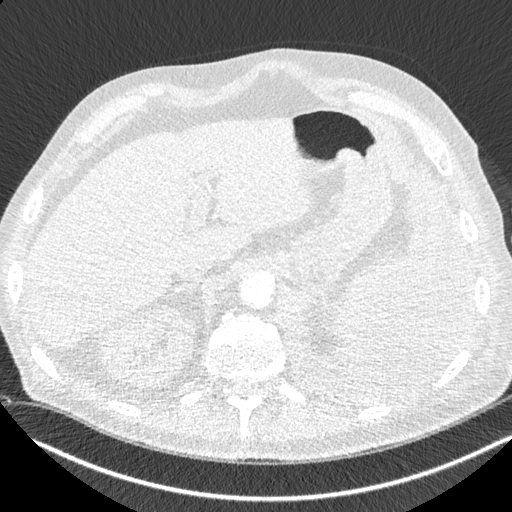
[im 113/365  mediastinal]
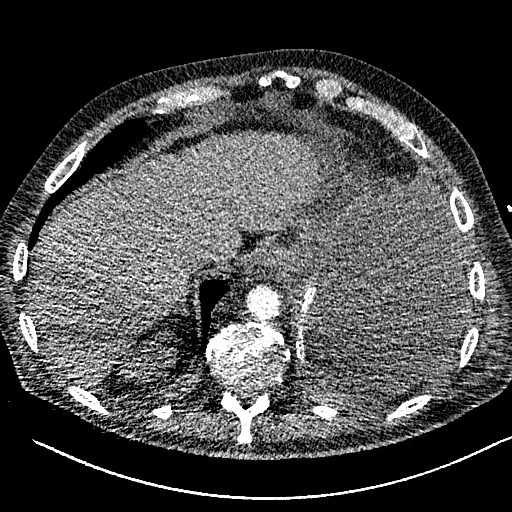
[im 141/365  lung]
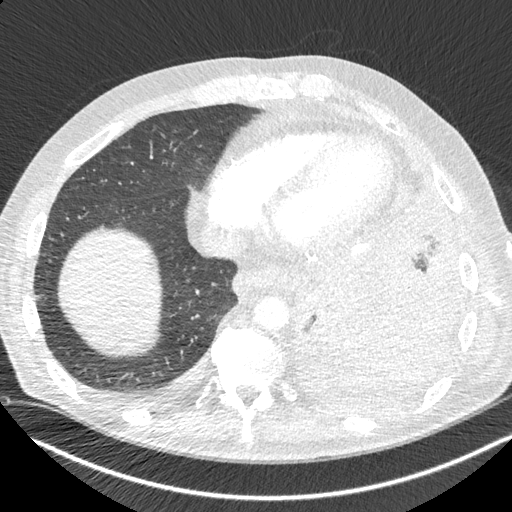
[im 169/365  mediastinal]
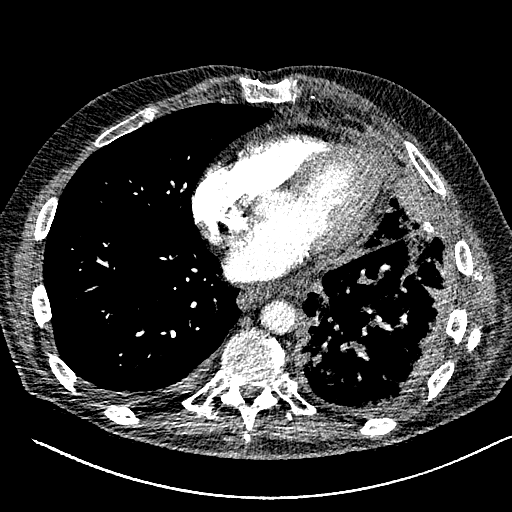
[im 197/365  lung]
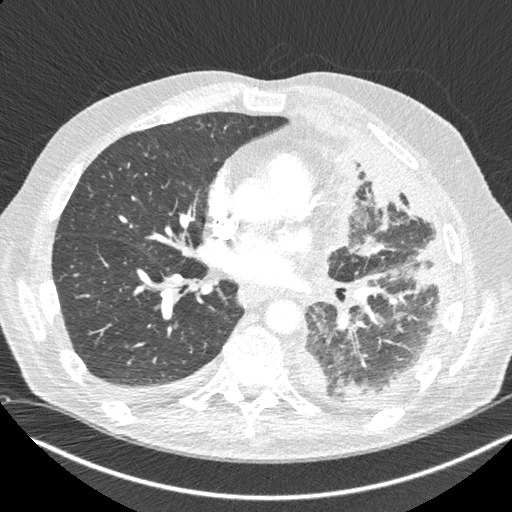
[im 225/365  mediastinal]
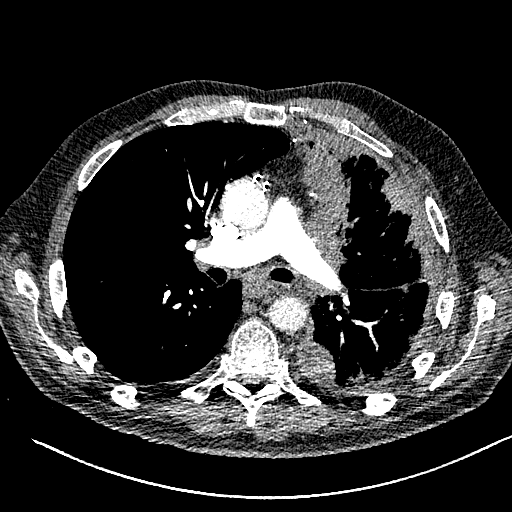
[im 253/365  lung]
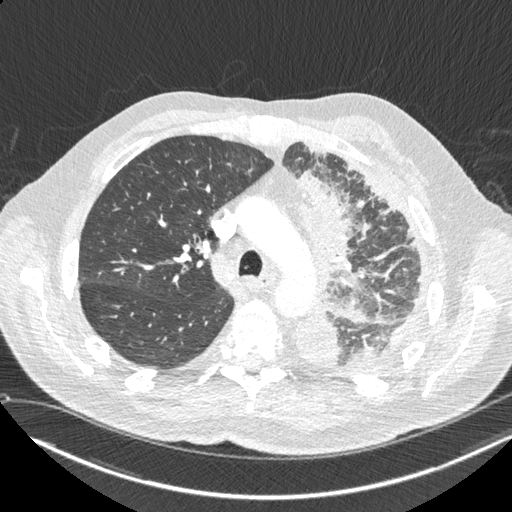
[im 281/365  mediastinal]
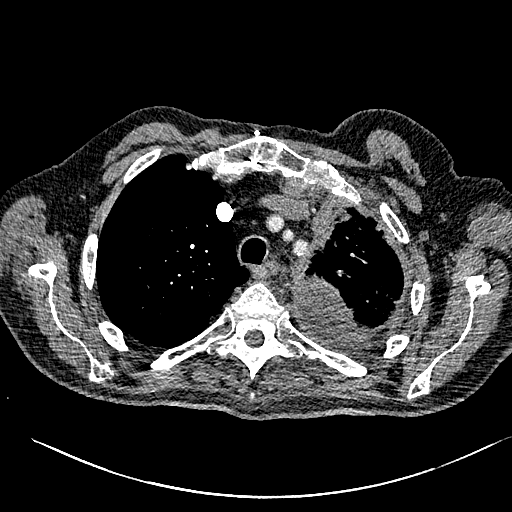
[im 309/365  lung]
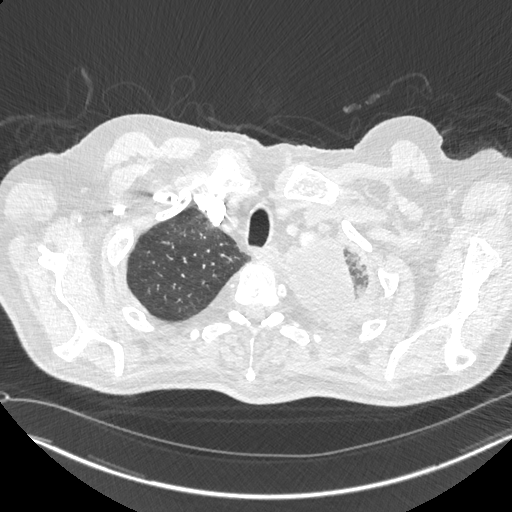
[im 337/365  mediastinal]
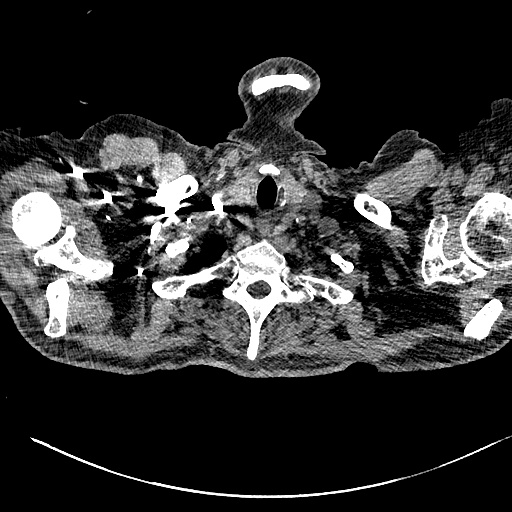

[Series 8: cta pulmonary 2.00 cor · coronal · 0.71mm/px · 1 of 157 slices shown]
[im 79/157  mediastinal]
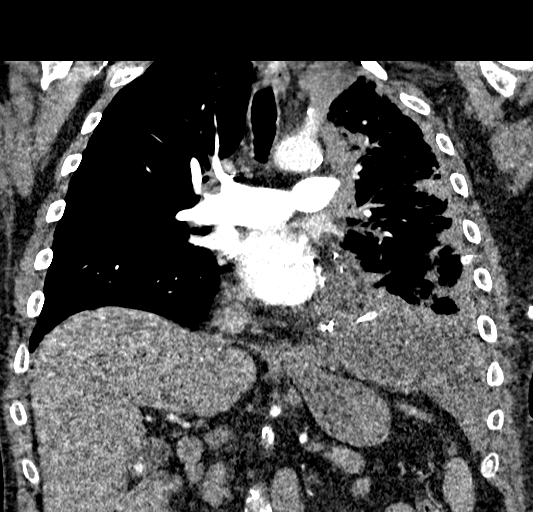

[13 of 36 positions shown; findings below may reference images not displayed]

FINDINGS: Cardiovascular: Normal heart size. Mild pericardial thickening
identified. Aortic atherosclerosis. Previous CABG.

Main pulmonary artery appears patent. No saddle embolus. No central
obstructing pulmonary artery filling defects noted. No lobar or
segmental pulmonary artery filling defects identified.

Mediastinum/Nodes: The trachea appears patent and is midline. Normal
appearance of the esophagus. No mediastinal, axillary adenopathy.
New left supraclavicular lymph node measures 0.9 cm, image [DATE].

Lungs/Pleura: Moderate changes of centrilobular emphysema. Small
right pleural effusion is new from previous exam. Interval placement
of left-sided chest with decrease in volume of previous large
loculated left pleural effusion. Unfortunately, there is been
significant interval increase in pleural thickening with enhancement
overlying the entire left lung concerning for pleural spread of
tumor. Pleural based tumor along the paramediastinal left upper lobe
has a thickness of 2.9 cm, image 145/6. With 0.8 cm previously.
There is new diffuse interstitial thickening throughout the left
lung concerning for lymphangitic spread of disease. Right lung
appears hyperinflated but relatively clear.

Upper Abdomen: Stones identified within the gallbladder. No acute
abnormality.

Musculoskeletal: No chest wall abnormality. No acute or significant
osseous findings.

Review of the MIP images confirms the above findings.
IMPRESSION: 1. No evidence for acute pulmonary embolus.
2. Interval placement of left-sided chest tube with significant
decrease in volume of loculated left pleural effusion.
Unfortunately, there is been marked increase in tumor rind encasing
the entire left lung concerning for progression of disease. There is
also been new diffuse increased interstitial thickening throughout
the left lung worrisome for lymphangitic spread of disease.

Aortic Atherosclerosis (V8XV7-DRM.M) and Emphysema (V8XV7-5Q0.2).
